# Patient Record
Sex: Female | Born: 1970 | Race: White | Hispanic: No | Smoking: Current every day smoker
Health system: Southern US, Community
[De-identification: ages and names within clinical notes are randomized; demographics above are authoritative.]

## PROBLEM LIST (undated history)

## (undated) DIAGNOSIS — K5792 Diverticulitis of intestine, part unspecified, without perforation or abscess without bleeding: Secondary | ICD-10-CM

## (undated) DIAGNOSIS — K589 Irritable bowel syndrome without diarrhea: Secondary | ICD-10-CM

## (undated) DIAGNOSIS — R0600 Dyspnea, unspecified: Secondary | ICD-10-CM

## (undated) DIAGNOSIS — M419 Scoliosis, unspecified: Secondary | ICD-10-CM

## (undated) DIAGNOSIS — I499 Cardiac arrhythmia, unspecified: Secondary | ICD-10-CM

## (undated) DIAGNOSIS — R519 Headache, unspecified: Secondary | ICD-10-CM

## (undated) DIAGNOSIS — G709 Myoneural disorder, unspecified: Secondary | ICD-10-CM

## (undated) DIAGNOSIS — Z8041 Family history of malignant neoplasm of ovary: Secondary | ICD-10-CM

## (undated) DIAGNOSIS — K219 Gastro-esophageal reflux disease without esophagitis: Secondary | ICD-10-CM

## (undated) DIAGNOSIS — D649 Anemia, unspecified: Secondary | ICD-10-CM

## (undated) DIAGNOSIS — J189 Pneumonia, unspecified organism: Secondary | ICD-10-CM

## (undated) DIAGNOSIS — I209 Angina pectoris, unspecified: Secondary | ICD-10-CM

## (undated) DIAGNOSIS — F419 Anxiety disorder, unspecified: Secondary | ICD-10-CM

## (undated) DIAGNOSIS — Z9889 Other specified postprocedural states: Secondary | ICD-10-CM

## (undated) DIAGNOSIS — C50919 Malignant neoplasm of unspecified site of unspecified female breast: Secondary | ICD-10-CM

## (undated) DIAGNOSIS — R112 Nausea with vomiting, unspecified: Secondary | ICD-10-CM

## (undated) HISTORY — DX: Diverticulitis of intestine, part unspecified, without perforation or abscess without bleeding: K57.92

## (undated) HISTORY — DX: Family history of malignant neoplasm of ovary: Z80.41

## (undated) HISTORY — DX: Malignant neoplasm of unspecified site of unspecified female breast: C50.919

## (undated) HISTORY — PX: ABDOMINAL HYSTERECTOMY: SHX81

## (undated) HISTORY — DX: Scoliosis, unspecified: M41.9

## (undated) HISTORY — PX: BACK SURGERY: SHX140

---

## 1999-07-29 ENCOUNTER — Emergency Department (HOSPITAL_COMMUNITY): Admission: EM | Admit: 1999-07-29 | Discharge: 1999-07-29 | Payer: Self-pay | Admitting: Emergency Medicine

## 1999-07-29 ENCOUNTER — Encounter: Payer: Self-pay | Admitting: Emergency Medicine

## 1999-10-04 ENCOUNTER — Encounter: Payer: Self-pay | Admitting: Emergency Medicine

## 1999-10-04 ENCOUNTER — Emergency Department (HOSPITAL_COMMUNITY): Admission: EM | Admit: 1999-10-04 | Discharge: 1999-10-04 | Payer: Self-pay | Admitting: Emergency Medicine

## 2000-06-14 ENCOUNTER — Other Ambulatory Visit: Admission: RE | Admit: 2000-06-14 | Discharge: 2000-06-14 | Payer: Self-pay | Admitting: Obstetrics and Gynecology

## 2000-08-03 ENCOUNTER — Encounter: Admission: RE | Admit: 2000-08-03 | Discharge: 2000-08-03 | Payer: Self-pay | Admitting: Family Medicine

## 2000-08-03 ENCOUNTER — Encounter: Payer: Self-pay | Admitting: Family Medicine

## 2000-09-14 ENCOUNTER — Other Ambulatory Visit: Admission: RE | Admit: 2000-09-14 | Discharge: 2000-09-14 | Payer: Self-pay | Admitting: *Deleted

## 2000-09-17 ENCOUNTER — Encounter (INDEPENDENT_AMBULATORY_CARE_PROVIDER_SITE_OTHER): Payer: Self-pay | Admitting: Specialist

## 2000-09-17 ENCOUNTER — Other Ambulatory Visit: Admission: RE | Admit: 2000-09-17 | Discharge: 2000-09-17 | Payer: Self-pay | Admitting: *Deleted

## 2000-11-23 ENCOUNTER — Encounter: Admission: RE | Admit: 2000-11-23 | Discharge: 2000-11-23 | Payer: Self-pay | Admitting: Urology

## 2000-11-23 ENCOUNTER — Encounter: Payer: Self-pay | Admitting: Urology

## 2000-12-06 ENCOUNTER — Other Ambulatory Visit: Admission: RE | Admit: 2000-12-06 | Discharge: 2000-12-06 | Payer: Self-pay | Admitting: *Deleted

## 2000-12-06 ENCOUNTER — Encounter (INDEPENDENT_AMBULATORY_CARE_PROVIDER_SITE_OTHER): Payer: Self-pay

## 2001-08-13 ENCOUNTER — Other Ambulatory Visit: Admission: RE | Admit: 2001-08-13 | Discharge: 2001-08-13 | Payer: Self-pay | Admitting: *Deleted

## 2002-02-04 ENCOUNTER — Other Ambulatory Visit: Admission: RE | Admit: 2002-02-04 | Discharge: 2002-02-04 | Payer: Self-pay | Admitting: *Deleted

## 2002-06-20 ENCOUNTER — Other Ambulatory Visit: Admission: RE | Admit: 2002-06-20 | Discharge: 2002-06-20 | Payer: Self-pay | Admitting: Obstetrics and Gynecology

## 2003-01-24 ENCOUNTER — Emergency Department (HOSPITAL_COMMUNITY): Admission: EM | Admit: 2003-01-24 | Discharge: 2003-01-24 | Payer: Self-pay | Admitting: Emergency Medicine

## 2003-05-13 ENCOUNTER — Encounter: Payer: Self-pay | Admitting: Family Medicine

## 2003-05-13 ENCOUNTER — Encounter: Admission: RE | Admit: 2003-05-13 | Discharge: 2003-05-13 | Payer: Self-pay | Admitting: Family Medicine

## 2003-12-02 ENCOUNTER — Other Ambulatory Visit: Admission: RE | Admit: 2003-12-02 | Discharge: 2003-12-02 | Payer: Self-pay | Admitting: Obstetrics and Gynecology

## 2004-08-24 ENCOUNTER — Other Ambulatory Visit: Admission: RE | Admit: 2004-08-24 | Discharge: 2004-08-24 | Payer: Self-pay | Admitting: Obstetrics and Gynecology

## 2004-09-23 ENCOUNTER — Other Ambulatory Visit: Admission: RE | Admit: 2004-09-23 | Discharge: 2004-09-23 | Payer: Self-pay | Admitting: Obstetrics and Gynecology

## 2004-10-07 ENCOUNTER — Encounter: Admission: RE | Admit: 2004-10-07 | Discharge: 2004-10-07 | Payer: Self-pay | Admitting: Gastroenterology

## 2004-11-29 ENCOUNTER — Ambulatory Visit (HOSPITAL_COMMUNITY): Admission: RE | Admit: 2004-11-29 | Discharge: 2004-11-29 | Payer: Self-pay | Admitting: Gastroenterology

## 2004-11-29 ENCOUNTER — Encounter (INDEPENDENT_AMBULATORY_CARE_PROVIDER_SITE_OTHER): Payer: Self-pay | Admitting: Specialist

## 2005-03-01 ENCOUNTER — Other Ambulatory Visit: Admission: RE | Admit: 2005-03-01 | Discharge: 2005-03-01 | Payer: Self-pay | Admitting: Obstetrics and Gynecology

## 2005-04-27 ENCOUNTER — Ambulatory Visit (HOSPITAL_COMMUNITY): Admission: RE | Admit: 2005-04-27 | Discharge: 2005-04-27 | Payer: Self-pay | Admitting: Obstetrics and Gynecology

## 2006-01-02 ENCOUNTER — Encounter (INDEPENDENT_AMBULATORY_CARE_PROVIDER_SITE_OTHER): Payer: Self-pay | Admitting: *Deleted

## 2006-01-02 ENCOUNTER — Inpatient Hospital Stay (HOSPITAL_COMMUNITY): Admission: RE | Admit: 2006-01-02 | Discharge: 2006-01-03 | Payer: Self-pay | Admitting: Obstetrics and Gynecology

## 2008-05-03 ENCOUNTER — Emergency Department (HOSPITAL_COMMUNITY): Admission: EM | Admit: 2008-05-03 | Discharge: 2008-05-03 | Payer: Self-pay | Admitting: Emergency Medicine

## 2008-08-25 ENCOUNTER — Ambulatory Visit: Payer: Self-pay | Admitting: Family Medicine

## 2008-08-25 DIAGNOSIS — F3289 Other specified depressive episodes: Secondary | ICD-10-CM | POA: Insufficient documentation

## 2008-08-25 DIAGNOSIS — F329 Major depressive disorder, single episode, unspecified: Secondary | ICD-10-CM | POA: Insufficient documentation

## 2008-08-25 DIAGNOSIS — F411 Generalized anxiety disorder: Secondary | ICD-10-CM | POA: Insufficient documentation

## 2008-09-03 ENCOUNTER — Encounter (INDEPENDENT_AMBULATORY_CARE_PROVIDER_SITE_OTHER): Payer: Self-pay | Admitting: Family Medicine

## 2008-09-07 ENCOUNTER — Telehealth (INDEPENDENT_AMBULATORY_CARE_PROVIDER_SITE_OTHER): Payer: Self-pay | Admitting: *Deleted

## 2008-09-11 ENCOUNTER — Encounter (INDEPENDENT_AMBULATORY_CARE_PROVIDER_SITE_OTHER): Payer: Self-pay | Admitting: Family Medicine

## 2008-10-12 ENCOUNTER — Ambulatory Visit: Payer: Self-pay | Admitting: Family Medicine

## 2008-10-13 DIAGNOSIS — K589 Irritable bowel syndrome without diarrhea: Secondary | ICD-10-CM | POA: Insufficient documentation

## 2008-11-17 ENCOUNTER — Ambulatory Visit: Payer: Self-pay | Admitting: Family Medicine

## 2008-12-29 ENCOUNTER — Encounter (INDEPENDENT_AMBULATORY_CARE_PROVIDER_SITE_OTHER): Payer: Self-pay | Admitting: Family Medicine

## 2008-12-29 ENCOUNTER — Ambulatory Visit: Payer: Self-pay | Admitting: Family Medicine

## 2008-12-29 DIAGNOSIS — F17200 Nicotine dependence, unspecified, uncomplicated: Secondary | ICD-10-CM | POA: Insufficient documentation

## 2008-12-29 DIAGNOSIS — Z720 Tobacco use: Secondary | ICD-10-CM | POA: Insufficient documentation

## 2008-12-29 LAB — CONVERTED CEMR LAB
ALT: 22 units/L (ref 0–35)
AST: 16 units/L (ref 0–37)
Albumin: 4.3 g/dL (ref 3.5–5.2)
Alkaline Phosphatase: 78 units/L (ref 39–117)
BUN: 5 mg/dL — ABNORMAL LOW (ref 6–23)
Band Neutrophils: 0 % (ref 0–10)
Basophils Absolute: 0 10*3/uL (ref 0.0–0.1)
Basophils Relative: 0 % (ref 0–1)
Bilirubin Urine: NEGATIVE
CO2: 22 meq/L (ref 19–32)
Calcium: 9.6 mg/dL (ref 8.4–10.5)
Chloride: 106 meq/L (ref 96–112)
Cholesterol: 197 mg/dL (ref 0–200)
Creatinine, Ser: 0.66 mg/dL (ref 0.40–1.20)
Eosinophils Absolute: 0.1 10*3/uL (ref 0.0–0.7)
Eosinophils Relative: 1 % (ref 0–5)
Glucose, Bld: 90 mg/dL (ref 70–99)
Glucose, Urine, Semiquant: NEGATIVE
HCT: 41.5 % (ref 36.0–46.0)
HDL: 54 mg/dL (ref >39)
Hemoglobin: 14.8 g/dL (ref 12.0–15.0)
Ketones, urine, test strip: NEGATIVE
LDL Cholesterol: 116 mg/dL — ABNORMAL HIGH (ref 0–99)
Lymphocytes Relative: 52 % — ABNORMAL HIGH (ref 12–46)
Lymphs Abs: 4.9 10*3/uL — ABNORMAL HIGH (ref 0.7–4.0)
MCHC: 35.7 g/dL (ref 30.0–36.0)
MCV: 89.1 fL (ref 78.0–100.0)
Monocytes Absolute: 0.5 10*3/uL (ref 0.1–1.0)
Monocytes Relative: 6 % (ref 3–12)
Neutro Abs: 3.9 10*3/uL (ref 1.7–7.7)
Neutrophils Relative %: 41 % — ABNORMAL LOW (ref 43–77)
Nitrite: NEGATIVE
Platelets: 240 10*3/uL (ref 150–400)
Potassium: 4.7 meq/L (ref 3.5–5.3)
Protein, U semiquant: NEGATIVE
RBC: 4.66 M/uL (ref 3.87–5.11)
RDW: 13.1 % (ref 11.5–15.5)
Sodium: 140 meq/L (ref 135–145)
Specific Gravity, Urine: <1.005
TSH: 2.652 microintl units/mL (ref 0.350–4.500)
Total Bilirubin: 0.5 mg/dL (ref 0.3–1.2)
Total CHOL/HDL Ratio: 3.6
Total Protein: 6.8 g/dL (ref 6.0–8.3)
Triglycerides: 135 mg/dL (ref <150)
Urobilinogen, UA: 0.2
VLDL: 27 mg/dL (ref 0–40)
WBC Urine, dipstick: NEGATIVE
WBC: 9.5 10*3/uL (ref 4.0–10.5)
pH: 7

## 2009-01-01 ENCOUNTER — Encounter (INDEPENDENT_AMBULATORY_CARE_PROVIDER_SITE_OTHER): Payer: Self-pay | Admitting: *Deleted

## 2009-02-09 ENCOUNTER — Ambulatory Visit: Payer: Self-pay | Admitting: Family Medicine

## 2009-02-09 DIAGNOSIS — J301 Allergic rhinitis due to pollen: Secondary | ICD-10-CM | POA: Insufficient documentation

## 2009-05-06 ENCOUNTER — Ambulatory Visit: Payer: Self-pay | Admitting: Internal Medicine

## 2009-05-06 DIAGNOSIS — G4700 Insomnia, unspecified: Secondary | ICD-10-CM | POA: Insufficient documentation

## 2009-05-06 LAB — CONVERTED CEMR LAB: Rapid Strep: NEGATIVE

## 2009-06-16 ENCOUNTER — Telehealth (INDEPENDENT_AMBULATORY_CARE_PROVIDER_SITE_OTHER): Payer: Self-pay | Admitting: Internal Medicine

## 2009-06-16 ENCOUNTER — Emergency Department (HOSPITAL_COMMUNITY): Admission: EM | Admit: 2009-06-16 | Discharge: 2009-06-16 | Payer: Self-pay | Admitting: Family Medicine

## 2009-07-08 ENCOUNTER — Telehealth (INDEPENDENT_AMBULATORY_CARE_PROVIDER_SITE_OTHER): Payer: Self-pay | Admitting: Internal Medicine

## 2009-07-10 ENCOUNTER — Emergency Department (HOSPITAL_COMMUNITY): Admission: EM | Admit: 2009-07-10 | Discharge: 2009-07-10 | Payer: Self-pay | Admitting: Emergency Medicine

## 2009-07-22 ENCOUNTER — Encounter (INDEPENDENT_AMBULATORY_CARE_PROVIDER_SITE_OTHER): Payer: Self-pay | Admitting: *Deleted

## 2009-07-27 ENCOUNTER — Ambulatory Visit: Payer: Self-pay | Admitting: Internal Medicine

## 2009-08-26 ENCOUNTER — Telehealth (INDEPENDENT_AMBULATORY_CARE_PROVIDER_SITE_OTHER): Payer: Self-pay | Admitting: Internal Medicine

## 2009-08-30 ENCOUNTER — Ambulatory Visit: Payer: Self-pay | Admitting: Internal Medicine

## 2009-08-30 LAB — CONVERTED CEMR LAB
Rubella: 23.9 intl units/mL — ABNORMAL HIGH
Rubeola IgG: 4.75 — ABNORMAL HIGH

## 2009-09-08 ENCOUNTER — Encounter (INDEPENDENT_AMBULATORY_CARE_PROVIDER_SITE_OTHER): Payer: Self-pay | Admitting: Internal Medicine

## 2009-09-15 ENCOUNTER — Encounter (INDEPENDENT_AMBULATORY_CARE_PROVIDER_SITE_OTHER): Payer: Self-pay | Admitting: *Deleted

## 2009-09-15 ENCOUNTER — Telehealth (INDEPENDENT_AMBULATORY_CARE_PROVIDER_SITE_OTHER): Payer: Self-pay | Admitting: Internal Medicine

## 2009-10-05 ENCOUNTER — Ambulatory Visit: Payer: Self-pay | Admitting: Internal Medicine

## 2009-10-05 DIAGNOSIS — M2550 Pain in unspecified joint: Secondary | ICD-10-CM | POA: Insufficient documentation

## 2009-10-05 LAB — CONVERTED CEMR LAB
Anti Nuclear Antibody(ANA): NEGATIVE
Rhuematoid fact SerPl-aCnc: <20 intl units/mL (ref 0–20)

## 2009-10-25 ENCOUNTER — Encounter (INDEPENDENT_AMBULATORY_CARE_PROVIDER_SITE_OTHER): Payer: Self-pay | Admitting: Internal Medicine

## 2009-11-05 ENCOUNTER — Telehealth (INDEPENDENT_AMBULATORY_CARE_PROVIDER_SITE_OTHER): Payer: Self-pay | Admitting: *Deleted

## 2009-11-16 ENCOUNTER — Encounter (INDEPENDENT_AMBULATORY_CARE_PROVIDER_SITE_OTHER): Payer: Self-pay | Admitting: Internal Medicine

## 2009-11-16 ENCOUNTER — Telehealth (INDEPENDENT_AMBULATORY_CARE_PROVIDER_SITE_OTHER): Payer: Self-pay | Admitting: Internal Medicine

## 2010-01-04 ENCOUNTER — Ambulatory Visit: Payer: Self-pay | Admitting: Internal Medicine

## 2010-01-04 DIAGNOSIS — M19049 Primary osteoarthritis, unspecified hand: Secondary | ICD-10-CM | POA: Insufficient documentation

## 2010-01-27 ENCOUNTER — Ambulatory Visit: Payer: Self-pay | Admitting: Internal Medicine

## 2010-01-27 DIAGNOSIS — K602 Anal fissure, unspecified: Secondary | ICD-10-CM | POA: Insufficient documentation

## 2010-01-27 DIAGNOSIS — K648 Other hemorrhoids: Secondary | ICD-10-CM | POA: Insufficient documentation

## 2010-03-08 ENCOUNTER — Ambulatory Visit: Payer: Self-pay | Admitting: Internal Medicine

## 2010-03-08 DIAGNOSIS — S335XXA Sprain of ligaments of lumbar spine, initial encounter: Secondary | ICD-10-CM | POA: Insufficient documentation

## 2010-03-17 ENCOUNTER — Telehealth (INDEPENDENT_AMBULATORY_CARE_PROVIDER_SITE_OTHER): Payer: Self-pay | Admitting: Internal Medicine

## 2010-05-27 ENCOUNTER — Encounter (INDEPENDENT_AMBULATORY_CARE_PROVIDER_SITE_OTHER): Payer: Self-pay | Admitting: *Deleted

## 2010-09-14 ENCOUNTER — Ambulatory Visit: Payer: Self-pay | Admitting: Internal Medicine

## 2010-09-14 ENCOUNTER — Encounter: Payer: Self-pay | Admitting: Physician Assistant

## 2010-10-28 ENCOUNTER — Ambulatory Visit: Admit: 2010-10-28 | Payer: Self-pay | Admitting: Internal Medicine

## 2010-11-24 NOTE — Letter (Signed)
Summary: *HSN Results Follow up  [facility]  [address] [place], [place] [address]   Phone: [phone]  Fax: [phone]      05/27/2010   JAICEE MICHELOTTI Sposito [address] [place], [place]  [address]   Dear  Ms. Reham Giel,                            ____S.Drinkard,FNP   ____D. Gore,FNP       ____B. McPherson,MD   ____V. Rankins,MD    __X__E. Mulberry,MD    ____N. Daphine Deutscher, FNP  ____D. Reche Dixon, MD    ____K. Philipp Deputy, MD    ____Other     This letter is to inform you that your recent test(s):  _______Pap Smear    _______Lab Test     _______X-ray    _______ is within acceptable limits  _______ requires a medication change  _______ requires a follow-up lab visit  _______ requires a follow-up visit with your provider   Comments:  Please give [place] a call regarding your recent presciption request.  Thank you.       _________________________________________________________ If you have any questions, please contact our office                     Sincerely,  Tiffany McCoy [name] [facility]

## 2010-11-24 NOTE — Progress Notes (Signed)
Summary: Physical Form  Phone Note Call from Patient Call back at Home Phone [phone]   Summary of Call: Pt states that she gave a physical form to the medical assistant on her last visit and she needs to make sure if the provider can mailed to her to her address asap because she needs to do her immunization by next week (school purpose). Mulberry MD Initial call taken by: Manon Hilding,  November 05, 2009 2:10 PM  Follow-up for Phone Call        forward to provider for completion of physical form..... Follow-up by: Mikey College [name],  November 08, 2009 12:11 PM  Additional Follow-up for Phone Call Additional follow up Details #1::        Where is it? Additional Follow-up by: Julieanne Manson MD,  November 09, 2009 6:31 PM    Additional Follow-up for Phone Call Additional follow up Details #2::    WE FOUND IT/HAS BEEN FILLED OUT //PT PICKED UP//11/15/09 Follow-up by: Arta Bruce,  November 16, 2009 12:43 PM

## 2010-11-24 NOTE — Assessment & Plan Note (Signed)
Summary: bowel movements issues//gk   Vital Signs:  Patient profile:   40 year old female Menstrual status:  hysterectomy Height:      60 inches Weight:      121 pounds BMI:     23.72 Temp:     97.9 degrees F oral Pulse rate:   76 / minute Pulse rhythm:   regular Resp:     18 per minute BP sitting:   113 / 75  (left arm) Cuff size:   regular  Vitals Entered By: [place] Shannon (January 27, 2010 8:46 AM) CC: pt is here because she has bowel movement issues.... pt says she does have hemorroids and she has IBS.... pt says she has been having chest pains and she says she can feel it in her back... Is Patient Diabetic? No Pain Assessment Patient in pain? no       Does patient need assistance? Functional Status Self care Ambulation Normal   CC:  pt is here because she has bowel movement issues.... pt says she does have hemorroids and she has IBS.... pt says she has been having chest pains and she says she can feel it in her back....  History of Present Illness: 1.  Hemorrhoids feel bigger--sensation that needs to go, but then cannot go.   If loose stool, can pass it, but having difficulties otherwise.  Has an episiotomy scar with burning in the area adding to this.  Not clear how much she is straining.  Has had this for 1 1/2 weeks.  Has also had a lot of bloating and early satiety with her IBS.  Using a soluble fiber that gets from health food store.  Uses this regularly.  Gets flairs on and off--generally set off by eating something that doesn't agree with her.  Is stressed with school and unemployment will run out shortly.  Does not feel she needs counseling for this currently.  No melena or hematochezia--is having BRBPR on tissue, however.  2.  Chest discomfort:  Started with bloating 1 1/2 weeks ago.  Sharp pain like a spear in lower sternum through to her back at times.  Worse when abdominal bloating is worse.  No definite change with passing gas or BM.  Radiated to right arm once  when lay down.  Can occur with any activity.  No change with eating as well.  Nothing seems to improve or worsen.  Does get nauseated with it, but has nausea frequently.  Discomfort lasts seconds.  Has never had this before.  Habits & Providers  Alcohol-Tobacco-Diet     Tobacco Status: current     Cigarette Packs/Day: 1.0     Year Started: age 4  Current Medications (verified): 1)  Metamucil 1.7 Gm Wafr (Psyllium) .... Eat 2 Wafers 2-3 Times Each Day For Ibs Diarrhea 2)  Fluticasone Propionate 50 Mcg/act Susp (Fluticasone Propionate) .... 2 Sprays Each Nostril Daily 3)  Allegra 180 Mg Tabs (Fexofenadine Hcl) .... Take 1 Tablet By Mouth Once A Day As Needed Allergy Symptoms 4)  Citalopram Hydrobromide 40 Mg Tabs (Citalopram Hydrobromide) .Marland Kitchen.. 1 Tab By Mouth Daily 5)  Trazodone Hcl 100 Mg Tabs (Trazodone Hcl) .... 1/2 To 1 Tab By Mouth At Bedtime  Allergies (verified): 1)  ! Sulfa 2)  ! Morphine 3)  Zoloft (Sertraline Hcl)  Family History: Mother, 39:  hypertension, OA, diverticulosis. Father, died 68: of CHF, MI at 89, gout, renal failure with CHF. Brother, 48: OA, diverticulosis Son, 22:  Asperger's, allergies, psoriasis,  eczema Son, 18:  Seizures, learning disabilities, developmental delay,  strabismus, speech apraxia   Social History: Associates in medical office;administration. Previously worked for [employer]. Divorced. Has a female  partner x 9 years. Lives at home with 2 sons  Current Smoker Alcohol use-no Drug use-no  Physical Exam  General:  NAD Chest Wall:  NT over anterior chest wall. Lungs:  Normal respiratory effort, chest expands symmetrically. Lungs are clear to auscultation, no crackles or wheezes. Heart:  Normal rate and regular rhythm. S1 and S2 normal without gallop, murmur, click, rub or other extra sounds.  Radial pulses normal and equal Abdomen:  Bowel sounds positive,abdomen soft and non-tender without masses, organomegaly or hernias noted. Rectal:   Fissure at 6 O'Clock--not actively bleeding currently, but appears freshly irritated.  External anus with generalized swelling and irritation of external tags, but no active hemorrhoids externally.  On anoscopy, no source of bleeding internally, just some swelling of tissues.  Fairly liquid light brown stool noted in rectum--scant amt.   Impression & Recommendations:  Problem # 1:  IBS (ICD-564.1) Feel chest discomfort related to this Will try some Dicyclomine to see if helps with bloating. Continue fiber pt. not interested in counseling for stress reduction at this time--will let me know  Problem # 2:  ANAL FISSURE (ICD-565.0)  Probably with some internal hemorrhoidal swelling--could not get a good view today Treat with Anusol.  Orders: Anoscopy (04540)  Complete Medication List: 1)  Metamucil 1.7 Gm Wafr (Psyllium) .... Eat 2 wafers 2-3 times each day for ibs diarrhea 2)  Fluticasone Propionate 50 Mcg/act Susp (Fluticasone propionate) .... 2 sprays each nostril daily 3)  Allegra 180 Mg Tabs (Fexofenadine hcl) .... Take 1 tablet by mouth once a day as needed allergy symptoms 4)  Citalopram Hydrobromide 40 Mg Tabs (Citalopram hydrobromide) .Marland Kitchen.. 1 tab by mouth daily 5)  Trazodone Hcl 100 Mg Tabs (Trazodone hcl) .... 1/2 to 1 tab by mouth at bedtime 6)  Anusol-hc 25 Mg Supp (Hydrocortisone acetate) .Marland Kitchen.. 1 supp per rectum three times a day, especially after  bm, as needed 7)  Anusol-hc 2.5 % Crea (Hydrocortisone) .... Apply three times a day to affected area as needed, especially after bm 8)  Dicyclomine Hcl 10 Mg Caps (Dicyclomine hcl) .Marland Kitchen.. 1 cap every 6 hours as needed for bloating  Patient Instructions: 1)  Witch hazel to affected area after BM--allow to air dry--then apply Anusol 2)  Call if new symptoms with Chest pain  Prescriptions: DICYCLOMINE HCL 10 MG CAPS (DICYCLOMINE HCL) 1 cap every 6 hours as needed for bloating  #30 x 1   Entered and Authorized by:   Julieanne Manson  MD   Signed by:   Julieanne Manson MD on 01/27/2010   Method used:   Print then Give to Patient   RxID:   [id number] ANUSOL-HC 2.5 % CREA (HYDROCORTISONE) apply three times a day to affected area as needed, especially after BM  #60 g x 1   Entered and Authorized by:   Julieanne Manson MD   Signed by:   Julieanne Manson MD on 01/27/2010   Method used:   Print then Give to Patient   RxID:   [id number] ANUSOL-HC 25 MG SUPP (HYDROCORTISONE ACETATE) 1 supp per rectum three times a day, especially after  BM, as needed  #24 x 0   Entered and Authorized by:   Julieanne Manson MD   Signed by:   Julieanne Manson MD on 01/27/2010   Method used:  Print then Give to Patient   RxID:   [id number]

## 2010-11-24 NOTE — Letter (Signed)
Summary: Handout Printed  Printed Handout:  - Viral Pharyngitis, Easy-to-Read

## 2010-11-24 NOTE — Progress Notes (Signed)
Summary: refill request  Phone Note Call from Patient Call back at Home Phone [phone]   Summary of Call: Pt also wants to make sure if she can get refills from valium ; she states that this medication is the only one that help her with anxiety issues.  The other medication gave her headache. Dyann Kief [place] [phone]) Delrae Alfred MD Initial call taken by: Manon Hilding,  November 16, 2009 12:36 PM  Follow-up for Phone Call        The pt still waiting for someone to call her back because she needs refills from her valium.  Please call her back asap.Manon Hilding  November 18, 2009 11:54 AM   will forward to provider to review...................Marland KitchenMikey College [name]  November 18, 2009 11:58 AM    Additional Follow-up for Phone Call Additional follow up Details #1::        We have discussed this previously. What dose of Citalopram is she taking currently? If she is not doing well with current meds, she will need to be seen. The type of med she is requesting is not something I use for long term treatment--it is habit forming. Additional Follow-up by: Julieanne Manson MD,  November 18, 2009 6:20 PM    Additional Follow-up for Phone Call Additional follow up Details #2::    Pt is currently taking citalopram 40mg   a day.  She says she just feels anxious sometimes and wants something to take when she feels like that.  She said her depression is fine.  She has an appt in March with you.  [facility] [address]  Pt can be reached at [phone].  Ok to leave msg. Follow-up by: Vesta Mixer [name],  November 19, 2009 12:23 PM  Additional Follow-up for Phone Call Additional follow up Details #3:: Details for Additional Follow-up Action Taken: Will dicuss at her appt. in March. The Citalopram is for her anxiety as well and I have discussed with her no benzodiazepines , which is the group of meds she is asking for.  Julieanne Manson MD  November 23, 2009 5:44 PM   left detailed msg on  pt's voice mail at her request......... Vesta Mixer [name]  November 26, 2009 4:37 PM

## 2010-11-24 NOTE — Letter (Signed)
Summary: [employer] [employer]//PT PICKED UP 11/15/09  [employer]//PT PICKED UP 11/15/09   Imported By: Silvio Pate Stanislawscyk 11/16/2009 12:38:51  _____________________________________________________________________  External Attachment:    Type:   Image     Comment:   External Document

## 2010-11-24 NOTE — Letter (Signed)
Summary: *HSN Results Follow up  [facility]  [address] [place], [place] [address]   Phone: [phone]  Fax: [phone]      10/25/2009   Tricia Berry [address] [place], [place]  [address]   Dear  [name],                            ____S.Drinkard,FNP   ____D. Gore,FNP       ____B. McPherson,MD   ____V. Rankins,MD    ___X_E. Rashaad Hallstrom,MD    ____N. Daphine Deutscher, FNP  ____D. Reche Dixon, MD    ____K. Philipp Deputy, MD    ____Other     This letter is to inform you that your recent test(s):  _______Pap Smear    ____X___Lab Test     _______X-ray    ____X___ is within acceptable limits  _______ requires a medication change  _______ requires a follow-up lab visit  _______ requires a follow-up visit with your provider   Comments:  Labs were negative for an inflammatory arthritis--probably just wear and tear arthritis       _________________________________________________________ If you have any questions, please contact our office                     Sincerely,  Julieanne Manson MD [facility]

## 2010-11-24 NOTE — Progress Notes (Signed)
Summary: Has itching rash and blisters  Phone Note Call from Patient   Caller: Patient Call For: Julieanne Manson MD Summary of Call: Pt. called, c/o itching, red, blistering rash to lower extremities and torso. States she was in the woods on Sunday and rash began about Tuesday, believes it to be poison oak or sumac.  Has been using Calogel.  Spoke to Dr. Delrae Alfred; pt. advised to soak in cold water x20 min. 4 times a day and  Benadryl by mouth every six hours as needed to relieve itching.  Pt. advised that Benadryl can cause sleepiness.  Advised to not scratch and wear light clothing until rash is gone, as well as washing hands before touching face or eyes.  Verbalized understanding.  Initial call taken by: Dutch Quint RN,  Mar 17, 2010 3:27 PM

## 2010-11-24 NOTE — Assessment & Plan Note (Signed)
Summary: 2 MONTH FU ON INSOMNIA//KT   Vital Signs:  Patient profile:   40 year old female Menstrual status:  hysterectomy Weight:      122 pounds Temp:     98.3 degrees F Pulse rate:   96 / minute Pulse rhythm:   regular BP standing:   107 / 75  (right arm) Cuff size:   regular  Vitals Entered By: Vesta Mixer [name] (Mar 08, 2010 10:26 AM) CC: Insominia is doing much better on the medication.  Is in bad back pain right now though from working in the yard last Thursday. Is Patient Diabetic? No Pain Assessment Patient in pain? yes     Location: lower back Intensity: 5  Does patient need assistance? Ambulation Normal   CC:  Insominia is doing much better on the medication.  Is in bad back pain right now though from working in the yard last Thursday.Marland Kitchen  History of Present Illness: 1.  Insomnia:  Trazodone has helped very much with sleep.  Feels well rested in morning when arises.  Often will have 2 days of week when able to fall asleep and does not need.  JItteriness and anxiety well controlled as well.  Pt. states those around her feel she is more relaxed and "even keeled."  2.  Probable IBS:  Dicyclomine has helped with explosive stools, especially after meals.  Not so much difficulty with bloating as she had before--the medication actually causes bloating, but not the painful sort she was having previously.    3.  Hemorrhoids:  also better with control of #2.  4.  Low back pain with pain on right.  States was doing yard work last week.  Was bent over much of day and lifting inappropriately.  Using heating pad.  Unable to soak in bathtub.  Tylenol not helping.  Taking Ibuprofen--800 mg  every 6 hours.  Not sure if it has really helped--very stiff.  Needs help getting out of bed in morning.     Allergies (verified): 1)  ! Sulfa 2)  ! Morphine 3)  Zoloft (Sertraline Hcl)  Physical Exam  General:  Difficulty sitting secondary to right low back and buttock pain Msk:  Tender  over right paraspinous musculature in sacral area only.  Tender over ischial tuberosity Neurologic:  Unable to elicit DTRs of LE bilaterally--unable to relax legs.  Walks on tip toes of right foot.   Impression & Recommendations:  Problem # 1:  INTERNAL HEMORRHOIDS (ICD-455.0) Improved  Problem # 2:  IBS (ICD-564.1) IMproved with Dicyclomine  Problem # 3:  INSOMNIA, CHRONIC (ICD-307.42) Much better with Trazodone.  Problem # 4:  BACK STRAIN, LUMBAR (ICD-847.2) May have more of a tendinitis of hamstrings as well Switch to Diclofenac and Cyclobenzaprine Gentle ROM  Problem # 5:  ANXIETY (ICD-300.00) Better Her updated medication list for this problem includes:    Citalopram Hydrobromide 40 Mg Tabs (Citalopram hydrobromide) .Marland Kitchen... 1 tab by mouth daily    Trazodone Hcl 100 Mg Tabs (Trazodone hcl) .Marland Kitchen... 1/2 to 1 tab by mouth at bedtime  Complete Medication List: 1)  Metamucil 1.7 Gm Wafr (Psyllium) .... Eat 2 wafers 2-3 times each day for ibs diarrhea 2)  Fluticasone Propionate 50 Mcg/act Susp (Fluticasone propionate) .... 2 sprays each nostril daily 3)  Allegra 180 Mg Tabs (Fexofenadine hcl) .... Take 1 tablet by mouth once a day as needed allergy symptoms 4)  Citalopram Hydrobromide 40 Mg Tabs (Citalopram hydrobromide) .Marland Kitchen.. 1 tab by mouth daily 5)  Trazodone Hcl 100 Mg Tabs (Trazodone hcl) .... 1/2 to 1 tab by mouth at bedtime 6)  Anusol-hc 25 Mg Supp (Hydrocortisone acetate) .Marland Kitchen.. 1 supp per rectum three times a day, especially after  bm, as needed 7)  Anusol-hc 2.5 % Crea (Hydrocortisone) .... Apply three times a day to affected area as needed, especially after bm 8)  Dicyclomine Hcl 10 Mg Caps (Dicyclomine hcl) .Marland Kitchen.. 1 cap every 6 hours as needed for bloating 9)  Diclofenac Sodium 75 Mg Tbec (Diclofenac sodium) .Marland Kitchen.. 1 tab by mouth two times a day with food as needed back pain 10)  Cyclobenzaprine Hcl 10 Mg Tabs (Cyclobenzaprine hcl) .... 1/2 to 1 tab every 8 hours as needed for  muscle spasm and pain  Patient Instructions: 1)  Schedule CPP in 4 months with Dr. Delrae Alfred Prescriptions: CYCLOBENZAPRINE HCL 10 MG TABS (CYCLOBENZAPRINE HCL) 1/2 to 1 tab every 8 hours as needed for muscle spasm and pain  #20 x 0   Entered and Authorized by:   Julieanne Manson MD   Signed by:   Julieanne Manson MD on 03/08/2010   Method used:   Print then Give to Patient   RxID:   [id number] DICYCLOMINE HCL 10 MG CAPS (DICYCLOMINE HCL) 1 cap every 6 hours as needed for bloating  #30 x 4   Entered and Authorized by:   Julieanne Manson MD   Signed by:   Julieanne Manson MD on 03/08/2010   Method used:   Print then Give to Patient   RxID:   [id number] TRAZODONE HCL 100 MG TABS (TRAZODONE HCL) 1/2 to 1 tab by mouth at bedtime  #30 x 11   Entered and Authorized by:   Julieanne Manson MD   Signed by:   Julieanne Manson MD on 03/08/2010   Method used:   Electronically to        [facility] [address]       [address], [address]       [place], [place]  [address]       Ph: [phone]       Fax: [phone]   RxID:   [id number] DICLOFENAC SODIUM 75 MG TBEC (DICLOFENAC SODIUM) 1 tab by mouth two times a day with food as needed back pain  #60 x 1   Entered and Authorized by:   Julieanne Manson MD   Signed by:   Julieanne Manson MD on 03/08/2010   Method used:   Electronically to        [facility] [address] ([facility]ail)       [address], [address]       [place], [place]  [address]       Ph: [phone]       Fax: [phone]   RxID:   [id number]

## 2010-11-24 NOTE — Assessment & Plan Note (Signed)
Summary: F/U Anxiety and Pain//mm   Vital Signs:  Patient profile:   40 year old female Menstrual status:  hysterectomy Weight:      122 pounds Temp:     98.3 degrees F Pulse rate:   76 / minute Pulse rhythm:   regular BP sitting:   114 / 75  (left arm) Cuff size:   regular  Vitals Entered By: Hale Drone (January 04, 2010 9:33 AM) CC: Pt here for f/u Per pt., anxiety is still the same. Pain in the hands has gotten better.  Is Patient Diabetic? No Pain Assessment Patient in pain? no       Does patient need assistance? Ambulation Normal   CC:  Pt here for f/u Per pt. and anxiety is still the same. Pain in the hands has gotten better. Marland Kitchen  History of Present Illness: 1.  Hand joint discomfort:  Appears to have OA of hands.  Joint pain is better.  Labs negative for inflammatory arthritis.  Taking MSM/glucosamine/chondroitin and seems to have helped a bit.    2.  Anxiety and Depression:  Still not sleeping at night--difficulty falling asleep mainly.  Increase in Citalopram to 40 mg did not seem to help.  Does not feel she is depressed--just insides feel "jittery".  Has been on Lexapro with best results. Has been on Effexor--slept all the time, Wellbutrin--went into rage, Zoloft--increased IBS symptoms--nonstop diarrhea.  Never on Amitriptyline, Cymbalta, Remeron, Trazadone.   Current Medications (verified): 1)  Metamucil 1.7 Gm Wafr (Psyllium) .... Eat 2 Wafers 2-3 Times Each Day For Ibs Diarrhea 2)  Flonase 50 Mcg/act Susp (Fluticasone Propionate) .... Use 2 Sprays To Each Side of Nose 3)  Allegra 180 Mg Tabs (Fexofenadine Hcl) .... Take 1 Tablet By Mouth Once A Day As Needed Allergy Symptoms 4)  Citalopram Hydrobromide 40 Mg Tabs (Citalopram Hydrobromide) .Marland Kitchen.. 1 Tab By Mouth Daily  Allergies (verified): 1)  ! Sulfa 2)  ! Morphine 3)  Zoloft (Sertraline Hcl)  Physical Exam  Extremities:  Hypertrophic changes of multple PIPS  and 1st MCPs without synovial thickening or erythema.   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 Unable to get to pharmacy to pick up ICP Lexapro. For now, stay on Citalopram in the morning and add Trazodone at bedtime  Complete Medication List: 1)  Metamucil 1.7 Gm Wafr (Psyllium) .... Eat 2 wafers 2-3 times each day for ibs diarrhea 2)  Fluticasone Propionate 50 Mcg/act Susp (Fluticasone propionate) .... 2 sprays each nostril daily 3)  Allegra 180 Mg Tabs (Fexofenadine hcl) .... Take 1 tablet by mouth once a day as needed allergy symptoms 4)  Citalopram Hydrobromide 40 Mg Tabs (Citalopram hydrobromide) .Marland Kitchen.. 1 tab by mouth daily 5)  Trazodone Hcl 100 Mg Tabs (Trazodone hcl) .... 1/2 to 1 tab by mouth at bedtime  Patient Instructions: 1)  Follow up with Dr. Delrae Alfred in 2 months --insomnia Prescriptions: CITALOPRAM HYDROBROMIDE 40 MG TABS (CITALOPRAM HYDROBROMIDE) 1 tab by mouth daily  #30 x 11   Entered and Authorized by:   Julieanne Manson MD   Signed by:   Julieanne Manson MD on 01/04/2010   Method used:   Electronically to        [facility] [address] ([facility]ail)       [address]       [place], [place]  [address]       Ph: [phone]       Fax: [phone]   RxID:   [id number] TRAZODONE HCL 100 MG TABS (TRAZODONE HCL) 1/2 to 1 tab by mouth at bedtime  #30 x 2   Entered and Authorized by:   Julieanne Manson MD   Signed by:   Julieanne Manson MD on 01/04/2010   Method used:   Electronically to        [facility] [address])       [address], [address]       [place], [place]  [address]       Ph: [phone]       Fax: [phone]   RxID:   [id number] FLUTICASONE PROPIONATE 50 MCG/ACT SUSP (FLUTICASONE PROPIONATE) 2  sprays each nostril daily  #1 x 11   Entered and Authorized by:   Julieanne Manson MD   Signed by:   Julieanne Manson MD on 01/04/2010   Method used:   Electronically to        [facility] [address]       [address], [address]       [place], [place]  [address]       Ph: [phone]       Fax: [phone]   RxID:   [id number]

## 2010-12-21 ENCOUNTER — Encounter (HOSPITAL_COMMUNITY): Payer: Self-pay | Admitting: Radiology

## 2010-12-21 ENCOUNTER — Emergency Department (HOSPITAL_COMMUNITY): Payer: Self-pay

## 2010-12-21 ENCOUNTER — Emergency Department (HOSPITAL_COMMUNITY)
Admission: EM | Admit: 2010-12-21 | Discharge: 2010-12-21 | Disposition: A | Discharge status: Home / Self Care | Payer: Self-pay | Attending: Emergency Medicine | Admitting: Emergency Medicine

## 2010-12-21 DIAGNOSIS — F411 Generalized anxiety disorder: Secondary | ICD-10-CM | POA: Insufficient documentation

## 2010-12-21 DIAGNOSIS — R10814 Left lower quadrant abdominal tenderness: Secondary | ICD-10-CM | POA: Insufficient documentation

## 2010-12-21 DIAGNOSIS — K5732 Diverticulitis of large intestine without perforation or abscess without bleeding: Secondary | ICD-10-CM | POA: Insufficient documentation

## 2010-12-21 DIAGNOSIS — R1032 Left lower quadrant pain: Secondary | ICD-10-CM | POA: Insufficient documentation

## 2010-12-21 HISTORY — DX: Irritable bowel syndrome, unspecified: K58.9

## 2010-12-21 LAB — COMPREHENSIVE METABOLIC PANEL
ALT: 18 U/L (ref 0–35)
Albumin: 3.9 g/dL (ref 3.5–5.2)
Alkaline Phosphatase: 70 U/L (ref 39–117)
Potassium: 3.8 mEq/L (ref 3.5–5.1)
Sodium: 139 mEq/L (ref 135–145)
Total Protein: 6.9 g/dL (ref 6.0–8.3)

## 2010-12-21 LAB — URINALYSIS, ROUTINE W REFLEX MICROSCOPIC
Bilirubin Urine: NEGATIVE
Protein, ur: NEGATIVE mg/dL
Urobilinogen, UA: 0.2 mg/dL (ref 0.0–1.0)

## 2010-12-21 LAB — DIFFERENTIAL
Basophils Absolute: 0 10*3/uL (ref 0.0–0.1)
Basophils Relative: 0 % (ref 0–1)
Eosinophils Absolute: 0 10*3/uL (ref 0.0–0.7)
Eosinophils Relative: 0 % (ref 0–5)
Lymphocytes Relative: 24 % (ref 12–46)

## 2010-12-21 LAB — CBC
Platelets: 239 10*3/uL (ref 150–400)
RDW: 12.8 % (ref 11.5–15.5)
WBC: 12.8 10*3/uL — ABNORMAL HIGH (ref 4.0–10.5)

## 2011-03-10 NOTE — Op Note (Signed)
[name], Tricia Berry           ACCOUNT NO.:  [id number]   MEDICAL RECORD NO.:  [id number]          PATIENT TYPE:  AMB   LOCATION:  ENDO                         FACILITY:  [facility]   PHYSICIAN:  Petra Kuba, M.D.    DATE OF BIRTH:  [birth year]   DATE OF PROCEDURE:  11/29/2004  DATE OF DISCHARGE:                                 OPERATIVE REPORT   PROCEDURE:  Colonoscopy with biopsy.   INDICATIONS:  Diarrhea, probable irritable bowel syndrome, want to rule out  Crohn's disease.  Consent was signed after risks, benefits, methods, options  thoroughly discussed in the office.   MEDICINES USED:  Demerol 70, Versed 8.   PROCEDURE:  Rectal inspection is pertinent for external hemorrhoids, small.  Digital examination was negative.  Video pediatric adjustable colonoscope  was inserted, easily advanced around the colon to the cecum.  This did not  require any abdominal pressure or any position changes.  No abnormality was  seen on insertion.  In fact, the scope was inserted short ways in the  terminal ileum which was normal.  Photo documentation was obtained.  Scattered biopsies were obtained and put in the first container.  Scope was  slowly withdrawn.  The colon was normal.  Random biopsies were obtained and  put in the second container.  Prep was adequate.  There was some liquid  stool that required washing and suctioning.  Once back in the rectum,  anorectal pullthrough and retroflexion confirmed some small hemorrhoids.  She had a white abnormal patch like mucosa in her rectum.  I doubt it was  significant but a few biopsies were obtained and put in a third container.  The scope was drained and readvanced a short ways up the left side of the  colon.  Air was suctioned.  The scope was removed.  The patient tolerated  the procedure well.  There was no obvious immediate complication.   ENDOSCOPIC DIAGNOSES:  1.  Tiny internal and external hemorrhoids.  2.  Abnormal white mucosa of the  rectum, status post biopsy.  3.  Otherwise within normal limits to the terminal ileum, status post random      biopsies throughout.   PLAN:  Await pathology, continue Librax as it seems to be helping.  Follow  up p.r.n. or in six weeks to recheck symptoms and make sure no further  workup plans are needed.     MEM/MEDQ  D:  11/29/2004  T:  11/29/2004  Job:  [id number]   cc:   Gretta Arab. Valentina Lucks, M.D.  [address] [place]  [place] [address]  Fax: [phone]

## 2011-03-10 NOTE — Op Note (Signed)
NAME:  Tricia Berry, Tricia Berry NO.:  [id number]   MEDICAL RECORD NO.:  [id number]          PATIENT TYPE:  OBV   LOCATION:  9316                          FACILITY:  [facility]   PHYSICIAN:  Malva Limes, M.D.    DATE OF BIRTH:  09-15-1971   DATE OF PROCEDURE:  DATE OF DISCHARGE:                                 OPERATIVE REPORT   Audio too short to transcribe (less than 5 seconds)           ______________________________  Malva Limes, M.D.     MA/MEDQ  D:  01/02/2006  T:  01/02/2006  Job:  [id number]

## 2011-03-10 NOTE — Op Note (Signed)
Tricia Berry, Tricia Berry NO.:  [id number]   MEDICAL RECORD NO.:  [id number]          PATIENT TYPE:  OBV   LOCATION:  9316                          FACILITY:  [facility]   PHYSICIAN:  Malva Limes, M.D.    DATE OF BIRTH:  [birth year]   DATE OF PROCEDURE:  01/02/2006  DATE OF DISCHARGE:                                 OPERATIVE REPORT   PREOPERATIVE DIAGNOSES:  1.  Chronic pelvic pain.  2.  Dyspareunia.  3.  History of endometriosis.   POSTOPERATIVE DIAGNOSES:  1.  Chronic pelvic pain.  2.  Dyspareunia.  3.  History of endometriosis.   OPERATION/PROCEDURE:  1.  Laparoscopy.  2.  Total vaginal hysterectomy.   SURGEON:  Malva Limes, M.D.   ASSISTANT:  Ilda Mori, M.D.   ANESTHESIA:  General endotracheal anesthesia.   ANTIBIOTICS:  Ancef 1 g.   ESTIMATED BLOOD LOSS:  150 mL.   COMPLICATIONS:  None.   SPECIMENS:  Uterus and cervix sent to pathology.   DRAINS:  Foley to bedside drainage.   DESCRIPTION OF PROCEDURE:  The patient was taken to the operating room where  general anesthesia was administered without complications.  She was then  placed in the dorsal lithotomy position. She was prepped and draped with  Betadine and draped in the usual fashion.  A red rubber catheter was used to  drain the bladder.  A Hulka tenaculum was applied to the anterior cervical  lip.  The umbilicus was grasped.  A vertical skin incision was made.  The  fascia was grasped and entered with the Mayo scissors.  The parietal  peritoneum was entered bluntly.  Sutures were placed laterally.  The Hasson  cannula was placed into the peritoneal cavity and  3 L of carbon dioxide was  then insufflated. The scope was placed in the abdominal cavity.  At this  point the patient had no evidence of endometriosis or adhesions. Ovaries and  fallopian tubes were appeared to be normal bilaterally.  The uterus was  appeared to be normal.  There were no problems with bowel.  At this point it  was decided to proceed with a vaginal hysterectomy.   Following this, the weighted speculum was placed in the vagina.  The  posterior cul-de-sac was entered sharply.  The uterosacral ligaments were  bilaterally clamped, cut and ligated with 0 Monocryl suture.  The cervix was  circumscribed.  The anterior cul-de-sac was entered bluntly.  The bladder  pillars were bilaterally clamped and cut and ligated with 0 Monocryl suture.  The cardinal ligaments were serially clamped, cut and ligated with 0  Monocryl suture.  The uterine vessels were bilaterally clamped, cut and  ligated with 0 Monocryl suture.  When the level of the round ligament was  reached, the uterus inverted and the triple pedicle which included the  fallopian tube, round ligament and ovarian ligament were bilaterally  clamped, cut and ligated with 0 Monocryl suture.  Following this, all  pedicles were checked and found to be hemostatic.  The vagina was then  closed using 2-0 Vicryl in a running locking fashion.  After  this, the  abdomen was reinsufflated and the pedicles were all checked and found to be  hemostatic.  A suspensory suture had been placed just prior to closing the  vagina which involved the uterosacral ligaments and the posterior cul-de-  sac.  On examination from the laparoscopy, it was felt that there might be  some tenting on the right ureter and, therefore, we decided to remove this  stitch from below.  This was accomplished without difficulty.  The  laparoscope was removed.  The pneumoperitoneum released.  The fascia was  closed with interrupted 0 Vicryl suture.  The skin closed with interrupted 4-  0 Vicryl suture.  Dermabond was used to close the suprapubic port.  The  patient tolerated the procedure well. She was taken to the recovery room in  stable condition.  Instrument and lap counts were correct x2.           ______________________________  Malva Limes, M.D.     MA/MEDQ  D:  01/02/2006  T:   01/03/2006  Job:  [id number]

## 2011-03-10 NOTE — H&P (Signed)
NAME:  BURNA, ATLAS NO.:  [id number]   MEDICAL RECORD NO.:  [id number]          PATIENT TYPE:  AMB   LOCATION:  SDC                           FACILITY:  [facility]   PHYSICIAN:  Malva Limes, M.D.    DATE OF BIRTH:  03/17/1971   DATE OF ADMISSION:  DATE OF DISCHARGE:                                HISTORY & PHYSICAL   HISTORY OF PRESENT ILLNESS:  Ms. Klunder is a 40 year old white female G3,  P2 who presents to [facility] for laparoscopically assisted vaginal  hysterectomy secondary to a long history of chronic pelvic pain and  dyspareunia. The patient has a several year history of pelvic pain and  dyspareunia. She did undergo a laparoscopy in July of 2006.  At that time  she was found to have endometriosis on her right uterosacral ligament.  The  patient was offered Lupron postoperatively and declined.  The patient stated  that she had done well for approximately six months and the pain has  returned. She was offered Lupron or a repeat laparoscopy.  The patient  declined both; she stated that she wanted definitive therapy.  The patient  does smoke and therefore oral contraceptive pills were not an option.  The  patient states that the pain is mostly on the right and this is where the  endometriosis was located at the time of laparoscopy.   PAST MEDICAL HISTORY:  The patient does have a history of Chlamydia in 2000.   PAST SURGICAL HISTORY:  Includes two vaginal births and the laparoscopy as  mentioned above.   ALLERGIES:  The patient has an allergy to SULFA.   SOCIAL HISTORY:  She smokes one pack per day, drinks alcohol occasionally.  She denies drug use.   FAMILY HISTORY:  She has a family history of cardiovascular disease and  ovarian cancer.  The patient also has a history of depression.   CURRENT MEDICATIONS:  None.   PHYSICAL EXAMINATION:  GENERAL APPEARANCE:  The patient is a well-developed,  well-nourished white female in no apparent distress.  HEENT:  Within normal limits.  LUNGS: Clear to auscultation.  CARDIOVASCULAR:  Examination reveals a regular rate and rhythm without  murmurs.  BREASTS:  Without masses or tenderness.  There is no lymphadenopathy.  ABDOMEN: Soft, nontender, nondistended. There are no palpable masses or  guarding.  EXTREMITIES:  Within normal limits.  PELVIC:  Examination reveals normal external genitalia. Vagina is without  lesions or discharge.  She has mild cervical motion tenderness.  The uterus  is normal size, shape.  There are no adnexal masses.   IMPRESSION:  1.  Pelvic pain.  2.  Dyspareunia.  3.  History of endometriosis.   PLAN:  Proceed with laparoscopically assisted vaginal hysterectomy.           ______________________________  Malva Limes, M.D.     MA/MEDQ  D:  01/01/2006  T:  01/01/2006  Job:  [id number]

## 2011-03-10 NOTE — Op Note (Signed)
NAME:  Tricia Berry, MILSTEIN NO.:  [id number]   MEDICAL RECORD NO.:  [id number]          PATIENT TYPE:  AMB   LOCATION:  SDC                           FACILITY:  [facility]   PHYSICIAN:  Malva Limes, M.D.    DATE OF BIRTH:  10-Dec-1970   DATE OF PROCEDURE:  04/27/2005  DATE OF DISCHARGE:                                 OPERATIVE REPORT   PREOPERATIVE DIAGNOSES:  1.  Chronic pelvic pain.  2.  Dyspareunia.   POSTOPERATIVE DIAGNOSES:  1.  Chronic pelvic pain.  2.  Dyspareunia.  3.  Minimal endometriosis.   PROCEDURES:  1.  Diagnostic laparoscopy.  2.  Cauterization of minimal endometriosis on right uterosacral ligament.  3.  Laparoscopic uterosacral nerve ablation.   SURGEON:  Malva Limes, M.D.   ANESTHESIA:  General.   ANTIBIOTICS:  Ancef 1 g.   ESTIMATED BLOOD LOSS:  Minimal.   COMPLICATIONS:  None.   SPECIMENS:  None.   PROCEDURE:  The patient was taken to the operating room, where a general  anesthetic was administered without complication.  She was then placed in  the dorsal lithotomy position.  She was prepped with Hibiclens and draped in  the usual fashion for this procedure.  A Hulka tenaculum was applied to the  anterior cervical lip.  The bladder was drained with a red rubber catheter.  Her umbilicus was injected with 0.25% Marcaine.  A vertical skin incision  was made.  The Veress needle was placed in the peritoneal cavity.  Three  liters of carbon dioxide was insufflated.  The 10 mm trocar was then placed  into the peritoneal cavity.  The scope was then placed.  A 5 mm port was  then placed under direct visualization in the suprapubic region.  Examination revealed a normal liver.  The gallbladder was not visualized.  The appendix appeared to be free and normal.  The anterior cul-de-sac was  without adhesions or endometriosis.  The uterus appeared to be normal size  and shape.  The fallopian tube and ovaries looked normal bilaterally.  The  patient  had normal ureters bilaterally.  The patient had minimal  endometriosis on the right pelvic sidewall near the uterosacral ligament.  The left pelvic sidewall appeared to be completely normal.  There was no  evidence of any abdominal adhesions or masses.  Once a thorough examination  was performed, the cautery was used to treat the endometriosis in the right  pelvic sidewall.  Following this, both uterosacral ligaments were  cauterized.  This concluded the procedure.  The patient was noted to have  large, congested veins in the infundibulopelvic ligament.  Otherwise, all  findings were as described above.  This concluded the procedure.  The  instruments were then removed, pneumoperitoneum released.  The fascia was  closed with an interrupted 0 Vicryl suture, the skin with 4-0 Vicryl.  Dermabond was used to close the port in the suprapubic region.  The patient  tolerated the procedure well.  She was extubated and taken to the recovery  room in stable condition.  Instrument and lap counts were correct x1.  The  patient will be discharged to home.  She will be sent home on Percocet to  take p.r.n.       MA/MEDQ  D:  04/27/2005  T:  04/27/2005  Job:  [id number]

## 2011-07-20 LAB — POCT I-STAT, CHEM 8
Glucose, Bld: 89
HCT: 44
Hemoglobin: 15
Potassium: 3.6
TCO2: 27

## 2011-07-20 LAB — CBC
MCHC: 34
MCV: 93.1
Platelets: 239
RBC: 4.54

## 2011-07-20 LAB — URINALYSIS, ROUTINE W REFLEX MICROSCOPIC
Glucose, UA: NEGATIVE
Leukocytes, UA: NEGATIVE
Specific Gravity, Urine: 1.002 — ABNORMAL LOW
pH: 6

## 2011-07-20 LAB — DIFFERENTIAL
Basophils Relative: 1
Eosinophils Absolute: 0.2
Monocytes Relative: 7
Neutrophils Relative %: 46

## 2011-07-20 LAB — URINE MICROSCOPIC-ADD ON

## 2011-07-20 LAB — WET PREP, GENITAL
Trich, Wet Prep: NONE SEEN
Yeast Wet Prep HPF POC: NONE SEEN

## 2011-12-01 ENCOUNTER — Other Ambulatory Visit: Payer: Self-pay | Admitting: Internal Medicine

## 2011-12-01 DIAGNOSIS — Z1231 Encounter for screening mammogram for malignant neoplasm of breast: Secondary | ICD-10-CM

## 2011-12-25 ENCOUNTER — Ambulatory Visit (HOSPITAL_COMMUNITY)
Admission: RE | Admit: 2011-12-25 | Discharge: 2011-12-25 | Disposition: A | Discharge status: Home / Self Care | Payer: Self-pay | Source: Ambulatory Visit | Attending: Internal Medicine | Admitting: Internal Medicine

## 2011-12-25 DIAGNOSIS — Z1231 Encounter for screening mammogram for malignant neoplasm of breast: Secondary | ICD-10-CM | POA: Insufficient documentation

## 2012-02-12 ENCOUNTER — Encounter (HOSPITAL_COMMUNITY): Payer: Self-pay | Admitting: *Deleted

## 2012-02-12 ENCOUNTER — Emergency Department (HOSPITAL_COMMUNITY)
Admission: EM | Admit: 2012-02-12 | Discharge: 2012-02-12 | Disposition: A | Discharge status: Home / Self Care | Payer: Self-pay | Source: Home / Self Care | Attending: Emergency Medicine | Admitting: Emergency Medicine

## 2012-02-12 DIAGNOSIS — J029 Acute pharyngitis, unspecified: Secondary | ICD-10-CM

## 2012-02-12 HISTORY — DX: Gastro-esophageal reflux disease without esophagitis: K21.9

## 2012-02-12 HISTORY — DX: Anxiety disorder, unspecified: F41.9

## 2012-02-12 LAB — POCT RAPID STREP A: Streptococcus, Group A Screen (Direct): NEGATIVE

## 2012-02-12 MED ORDER — DIPHENHYDRAMINE HCL 12.5 MG/5ML PO LIQD: 12.5000 mg | Freq: Four times a day (QID) | ORAL | Status: DC | PRN

## 2012-02-12 MED ORDER — ONDANSETRON HCL 4 MG PO TABS: 4.0000 mg | ORAL_TABLET | Freq: Three times a day (TID) | ORAL | Status: AC | PRN

## 2012-02-12 MED ORDER — PSEUDOEPHEDRINE-GUAIFENESIN ER 120-1200 MG PO TB12: 1.0000 | ORAL_TABLET | Freq: Two times a day (BID) | ORAL | Status: DC

## 2012-02-12 MED ORDER — FLUTICASONE PROPIONATE 50 MCG/ACT NA SUSP: 2.0000 | Freq: Every day | NASAL | Status: DC

## 2012-02-12 MED ORDER — ALUM & MAG HYDROXIDE-SIMETH 200-200-20 MG/5ML PO SUSP: 5.0000 mL | Freq: Four times a day (QID) | ORAL | Status: AC | PRN

## 2012-02-12 MED ORDER — LIDOCAINE VISCOUS 2 % MT SOLN: 10.0000 mL | Freq: Three times a day (TID) | OROMUCOSAL | Status: AC | PRN

## 2012-02-12 NOTE — ED Notes (Signed)
Pt  Reports  Symptoms  Of  sorethroat    r  Side  With  Pain radiating  Up  To r  Ear  Area     She  Also  Reports       Headache  And  Some  Diarrhea  As  Well      Symptoms  X  4  Days

## 2012-02-12 NOTE — ED Provider Notes (Signed)
History     CSN: [id number]  Arrival date & time 02/12/12  1751   First MD Initiated Contact with Patient 02/12/12 1809      Chief Complaint  Patient presents with  . Sore Throat    (Consider location/radiation/quality/duration/timing/severity/associated sxs/prior treatment) HPI Comments: SORE THROAT  Onset: 4 days    Severity: moderate Tried OTC meds without significant relief.  Symptoms:  No Fever but reports chills and feeling "hot".  Reports nausea, no vomiting. + Swollen neck glands    + Cough/URI sxs, right ear pain. No hearing changes, otorrhea. + Myalgias No Headache No Rash     No Recent Strep Exposure, but reports multiple sick contacts with similar symptoms  No Abdominal Pain No reflux sxs No Allergy sxs  No Breathing difficulty No Drooling No Trismus  ROS as noted in HPI. All other ROS negative.    Patient is a 41 y.o. female presenting with pharyngitis. The history is provided by the patient.  Sore Throat    Past Medical History  Diagnosis Date  . IBS (irritable bowel syndrome)   . GERD (gastroesophageal reflux disease)   . Anxiety     Past Surgical History  Procedure Date  . Abdominal hysterectomy     History reviewed. No pertinent family history.  History  Substance Use Topics  . Smoking status: Current Everyday Smoker  . Smokeless tobacco: Not on file  . Alcohol Use: Yes    OB History    Grav Para Term Preterm Abortions TAB SAB Ect Mult Living                  Review of Systems  Allergies  Morphine; Sertraline hcl; and Sulfonamide derivatives  Home Medications   Current Outpatient Rx  Name Route Sig Dispense Refill  . CLONAZEPAM 1 MG PO TABS Oral Take 1 mg by mouth 2 (two) times daily as needed.    . TRAZODONE HCL PO Oral Take by mouth.    Marland Kitchen ALUM & MAG HYDROXIDE-SIMETH 200-200-20 MG/5ML PO SUSP Oral Take 5 mLs by mouth 4 (four) times daily as needed for indigestion. 150 mL 0  . DIPHENHYDRAMINE HCL 12.5 MG/5ML PO  LIQD Oral Take 5 mLs (12.5 mg total) by mouth 4 (four) times daily as needed for allergies. 120 mL 0  . FLUTICASONE PROPIONATE 50 MCG/ACT NA SUSP Nasal Place 2 sprays into the nose daily. 16 g 2  . LIDOCAINE VISCOUS 2 % MT SOLN Oral Take 10 mLs by mouth 3 (three) times daily as needed for pain. Swish and spit. Do not swallow. 100 mL 0  . ONDANSETRON HCL 4 MG PO TABS Oral Take 1 tablet (4 mg total) by mouth every 8 (eight) hours as needed for nausea. May cause constipation 20 tablet 0  . PSEUDOEPHEDRINE-GUAIFENESIN ER [id number] MG PO TB12 Oral Take 1 tablet by mouth 2 (two) times daily. 20 each 0    BP 119/82  Pulse 69  Temp(Src) 98.9 F (37.2 C) (Oral)  Resp 16  SpO2 100%  Physical Exam  Nursing note and vitals reviewed. Constitutional: She is oriented to person, place, and time. She appears well-developed and well-nourished.  HENT:  Head: Normocephalic and atraumatic.  Right Ear: Tympanic membrane and ear canal normal.  Left Ear: Tympanic membrane and ear canal normal.  Nose: Mucosal edema and rhinorrhea present. No epistaxis.  Mouth/Throat: Uvula is midline and mucous membranes are normal. Posterior oropharyngeal erythema present. No oropharyngeal exudate.       (-) frontal,  maxillary sinus tenderness  Eyes: Conjunctivae and EOM are normal. Pupils are equal, round, and reactive to light.  Neck: Normal range of motion.       Shotty cervical lymphadenopathy bilaterally  Cardiovascular: Normal rate, regular rhythm and normal heart sounds.   Pulmonary/Chest: Effort normal and breath sounds normal.  Abdominal: Soft. Bowel sounds are normal. She exhibits no distension.  Musculoskeletal: Normal range of motion.  Neurological: She is alert and oriented to person, place, and time.  Skin: Skin is warm and dry. No rash noted.  Psychiatric: She has a normal mood and affect. Her behavior is normal. Judgment and thought content normal.    ED Course  Procedures (including critical care  time)   Labs Reviewed  POCT RAPID STREP A ([facility]ARE ONLY)   No results found.   1. Pharyngitis       RAS neg  MDM     Luiz Blare, MD 02/12/12 [phone]

## 2012-02-12 NOTE — ED Notes (Signed)
Bed:UCPR1<BR> Expected date:<BR> Expected time:<BR> Means of arrival:<BR> Comments:<BR>

## 2012-02-12 NOTE — Discharge Instructions (Signed)
Make sure drink plenty of fluids. The you may also use a Neti pot or the Lloyd Huger med sinus rinse as such as you want for nasal congestion. Return if you get worse, have any voice changes, if you're unable to keep fluids down, or for any other concerns.

## 2012-05-09 ENCOUNTER — Encounter (HOSPITAL_COMMUNITY): Payer: Self-pay | Admitting: *Deleted

## 2012-05-09 ENCOUNTER — Emergency Department (INDEPENDENT_AMBULATORY_CARE_PROVIDER_SITE_OTHER)
Admission: EM | Admit: 2012-05-09 | Discharge: 2012-05-09 | Disposition: A | Discharge status: Nursing Home (Includes ICF) | Payer: Self-pay | Source: Home / Self Care

## 2012-05-09 ENCOUNTER — Emergency Department (HOSPITAL_COMMUNITY)
Admission: EM | Admit: 2012-05-09 | Discharge: 2012-05-09 | Disposition: A | Discharge status: Home Health | Payer: Self-pay | Attending: Emergency Medicine | Admitting: Emergency Medicine

## 2012-05-09 ENCOUNTER — Emergency Department (HOSPITAL_COMMUNITY): Payer: Self-pay

## 2012-05-09 DIAGNOSIS — R252 Cramp and spasm: Secondary | ICD-10-CM

## 2012-05-09 DIAGNOSIS — M542 Cervicalgia: Secondary | ICD-10-CM | POA: Insufficient documentation

## 2012-05-09 DIAGNOSIS — J039 Acute tonsillitis, unspecified: Secondary | ICD-10-CM | POA: Insufficient documentation

## 2012-05-09 DIAGNOSIS — K219 Gastro-esophageal reflux disease without esophagitis: Secondary | ICD-10-CM | POA: Insufficient documentation

## 2012-05-09 DIAGNOSIS — K589 Irritable bowel syndrome without diarrhea: Secondary | ICD-10-CM | POA: Insufficient documentation

## 2012-05-09 DIAGNOSIS — F411 Generalized anxiety disorder: Secondary | ICD-10-CM | POA: Insufficient documentation

## 2012-05-09 DIAGNOSIS — F17200 Nicotine dependence, unspecified, uncomplicated: Secondary | ICD-10-CM | POA: Insufficient documentation

## 2012-05-09 LAB — BASIC METABOLIC PANEL
BUN: <3 mg/dL — ABNORMAL LOW (ref 6–23)
Calcium: 9.6 mg/dL (ref 8.4–10.5)
GFR calc Af Amer: >90 mL/min (ref >90)
GFR calc non Af Amer: >90 mL/min (ref >90)
Glucose, Bld: 94 mg/dL (ref 70–99)

## 2012-05-09 LAB — CBC
HCT: 43.6 % (ref 36.0–46.0)
Hemoglobin: 15.2 g/dL — ABNORMAL HIGH (ref 12.0–15.0)
MCH: 31.8 pg (ref 26.0–34.0)
MCHC: 34.9 g/dL (ref 30.0–36.0)
MCV: 91.2 fL (ref 78.0–100.0)

## 2012-05-09 MED ORDER — FENTANYL CITRATE 0.05 MG/ML IJ SOLN
50.0000 ug | Freq: Once | INTRAMUSCULAR | Status: AC
  Administered 2012-05-09: 50 ug via INTRAVENOUS
  Filled 2012-05-09: qty 2

## 2012-05-09 MED ORDER — HYDROCODONE-ACETAMINOPHEN 5-325 MG PO TABS
1.0000 | ORAL_TABLET | Freq: Once | ORAL | Status: AC
  Administered 2012-05-09: 1 via ORAL

## 2012-05-09 MED ORDER — KETOROLAC TROMETHAMINE 60 MG/2ML IM SOLN: 60.0000 mg | Freq: Once | INTRAMUSCULAR | Status: DC

## 2012-05-09 MED ORDER — OXYCODONE-ACETAMINOPHEN 5-325 MG PO TABS
1.0000 | ORAL_TABLET | Freq: Once | ORAL | Status: AC
  Administered 2012-05-09: 1 via ORAL
  Filled 2012-05-09: qty 2

## 2012-05-09 MED ORDER — CLINDAMYCIN PHOSPHATE 600 MG/50ML IV SOLN
600.0000 mg | Freq: Once | INTRAVENOUS | Status: DC
  Filled 2012-05-09: qty 50

## 2012-05-09 MED ORDER — HYDROCODONE-ACETAMINOPHEN 5-500 MG PO TABS: 1.0000 | ORAL_TABLET | Freq: Four times a day (QID) | ORAL | Status: AC | PRN

## 2012-05-09 MED ORDER — HYDROCODONE-ACETAMINOPHEN 5-325 MG PO TABS
ORAL_TABLET | ORAL | Status: AC
  Filled 2012-05-09: qty 1

## 2012-05-09 MED ORDER — CLINDAMYCIN HCL 150 MG PO CAPS: 300.0000 mg | ORAL_CAPSULE | Freq: Four times a day (QID) | ORAL | Status: DC

## 2012-05-09 MED ORDER — PENICILLIN V POTASSIUM 500 MG PO TABS: 500.0000 mg | ORAL_TABLET | Freq: Four times a day (QID) | ORAL | Status: AC

## 2012-05-09 MED ORDER — IOHEXOL 300 MG/ML  SOLN
75.0000 mL | Freq: Once | INTRAMUSCULAR | Status: AC | PRN
  Administered 2012-05-09: 75 mL via INTRAVENOUS

## 2012-05-09 MED ORDER — HYDROCODONE-ACETAMINOPHEN 5-500 MG PO TABS: 1.0000 | ORAL_TABLET | Freq: Four times a day (QID) | ORAL | Status: DC | PRN

## 2012-05-09 NOTE — ED Notes (Signed)
Patient transported to CT 

## 2012-05-09 NOTE — ED Provider Notes (Signed)
Medical screening examination/treatment/procedure(s) were performed by PGY-3 [facility] resident and as supervising physician I personally examined patient, discussed case and management with resident physician and was immediately available for consultation/collaboration. Agree with documentation above.  Sharin Grave, MD   Sharin Grave, MD 05/09/12 [phone]

## 2012-05-09 NOTE — ED Notes (Signed)
Sent from [facility].  The pt received a vicodin there

## 2012-05-09 NOTE — ED Notes (Signed)
Lt sided throat and jaw pain since Sunday.  No temp

## 2012-05-09 NOTE — ED Provider Notes (Signed)
History    This chart was scribed for Tricia Bucco, MD, MD by Smitty Pluck. The patient was seen in room TR02C and the patient's care was started at 5:24PM.   CSN: [id number]  Arrival date & time 05/09/12  1541   First MD Initiated Contact with Patient 05/09/12 1629      Chief Complaint  Patient presents with  . Sore Throat    (Consider location/radiation/quality/duration/timing/severity/associated sxs/prior treatment) The history is provided by the patient.   MERLINDA Berry is a 41 y.o. female who presents to the Emergency Department complaining of constant moderate sore throat and cervical adenopathy, left jaw pain onset 4 days ago. Pt reports having nausea, congestion, cough. Denies emesis. Pt reports that underneath her tongue she has "funny feeling" sensation and feels swollen. Reports swallowing aggravates the pain. Was seen at Urgent Care and noted to have trismus so sent over here for further evaluation.  Past Medical History  Diagnosis Date  . IBS (irritable bowel syndrome)   . GERD (gastroesophageal reflux disease)   . Anxiety     Past Surgical History  Procedure Date  . Abdominal hysterectomy     No family history on file.  History  Substance Use Topics  . Smoking status: Current Everyday Smoker  . Smokeless tobacco: Not on file  . Alcohol Use: Yes    OB History    Grav Para Term Preterm Abortions TAB SAB Ect Mult Living                  Review of Systems  Constitutional: Positive for activity change. Negative for fever and chills.  HENT: Positive for sore throat and neck pain. Negative for trouble swallowing and voice change.   Respiratory: Negative for cough and shortness of breath.   Gastrointestinal: Positive for nausea. Negative for vomiting and diarrhea.  Skin: Negative for rash and wound.  Neurological: Negative for headaches.  All other systems reviewed and are negative.    Allergies  Morphine; Sertraline hcl; and Sulfonamide  derivatives  Home Medications   Current Outpatient Rx  Name Route Sig Dispense Refill  . CLONAZEPAM 1 MG PO TABS Oral Take 1 mg by mouth 2 (two) times daily as needed. For anxiety    . CLINDAMYCIN HCL 150 MG PO CAPS Oral Take 2 capsules (300 mg total) by mouth every 6 (six) hours. 56 capsule 0  . DIPHENHYDRAMINE HCL 12.5 MG/5ML PO LIQD Oral Take 5 mLs (12.5 mg total) by mouth 4 (four) times daily as needed for allergies. 120 mL 0  . HYDROCODONE-ACETAMINOPHEN 5-500 MG PO TABS Oral Take 1-2 tablets by mouth every 6 (six) hours as needed for pain. 15 tablet 0    BP 125/80  Pulse 56  Temp 98.3 F (36.8 C) (Oral)  Resp 18  SpO2 100%  Physical Exam  Nursing note and vitals reviewed. Constitutional: She is oriented to person, place, and time. She appears well-developed and well-nourished.  HENT:  Head: Normocephalic and atraumatic.  Right Ear: External ear normal.  Left Ear: External ear normal.  Mouth/Throat: No oropharyngeal exudate or posterior oropharyngeal erythema.       Fullness under tongue on left side.  Swelling of submandibular with moderate trismus No elevation of tongue   Eyes: Pupils are equal, round, and reactive to light.  Neck: Normal range of motion. Neck supple.  Cardiovascular: Normal rate, regular rhythm and normal heart sounds.   Pulmonary/Chest: Effort normal and breath sounds normal. No respiratory distress. She has no  wheezes. She has no rales. She exhibits no tenderness.  Abdominal: Soft. Bowel sounds are normal. There is no tenderness. There is no rebound and no guarding.  Musculoskeletal: Normal range of motion. She exhibits no edema.  Lymphadenopathy:    She has no cervical adenopathy.  Neurological: She is alert and oriented to person, place, and time.  Skin: Skin is warm and dry. No rash noted.  Psychiatric: She has a normal mood and affect.    ED Course  Procedures (including critical care time) DIAGNOSTIC STUDIES: Oxygen Saturation is 100% on  room air, normal by my interpretation.    COORDINATION OF CARE:    Results for orders placed during the hospital encounter of 05/09/12  RAPID STREP SCREEN      Component Value Range   Streptococcus, Group A Screen (Direct) NEGATIVE  NEGATIVE  CBC      Component Value Range   WBC 8.6  4.0 - 10.5 K/uL   RBC 4.78  3.87 - 5.11 MIL/uL   Hemoglobin 15.2 (*) 12.0 - 15.0 g/dL   HCT 40.9  81.1 - 91.4 %   MCV 91.2  78.0 - 100.0 fL   MCH 31.8  26.0 - 34.0 pg   MCHC 34.9  30.0 - 36.0 g/dL   RDW 78.2  95.6 - 21.3 %   Platelets 263  150 - 400 K/uL  BASIC METABOLIC PANEL      Component Value Range   Sodium 142  135 - 145 mEq/L   Potassium 3.9  3.5 - 5.1 mEq/L   Chloride 103  96 - 112 mEq/L   CO2 27  19 - 32 mEq/L   Glucose, Bld 94  70 - 99 mg/dL   BUN <3 (*) 6 - 23 mg/dL   Creatinine, Ser 0.86  0.50 - 1.10 mg/dL   Calcium 9.6  8.4 - 57.8 mg/dL   GFR calc non Af Amer >90  >90 mL/min   GFR calc Af Amer >90  >90 mL/min   Ct Soft Tissue Neck W Contrast  05/09/2012  *RADIOLOGY REPORT*  Clinical Data: The left-sided jaw pain.  Left neck pain.  Swelling below the mandible.  CT NECK WITH CONTRAST  Technique:  Multidetector CT imaging of the neck was performed with intravenous contrast.  Contrast: 75mL OMNIPAQUE IOHEXOL 300 MG/ML  SOLN  Comparison: None.  Findings: Limited imaging of the brain is unremarkable.  No focal mucosal or submucosal lesions are present.  The vocal cords are midline and symmetric.  The thyroid is unremarkable.  Prominent jugulodigastric lymph nodes are present bilaterally.  The left-sided node is larger than the right , measuring at least 2.6 x 1.3 cm.  The largest right-sided node is 18 mm in long axis.  There is some asymmetry of the palatine tonsils with increased enhancement on the left. A discrete mass is not visualized.  The salivary glands are unremarkable.  There are small lymph nodes at the tail of the parotid bilaterally.  The left parotid tail is the area marked.  This  is also overlying the asymmetric left jugulodigastric lymph node.  The bone windows demonstrate to mild endplate degenerative changes at C6-7.  The lung apices are clear.  IMPRESSION:  1.  Asymmetric left level II lymph node subjacent to the area marked.  This may represent an enlarged jugulodigastric lymph node. Although it is ovoid in shape, a metastatic node is not excluded. 2.  Asymmetric enhancement of the left palatine tonsil.  Recommend evaluation of the tonsils.  3.  Minimal degenerative changes in the cervical spine.  Original Report Authenticated By: Jamesetta Orleans. MATTERN, M.D.      1. Tonsillitis       MDM  Pt with sore throat.  Pain on palpation of left mandible over gums, but no teeth in mouth, no evident abscess.  +LAD in submandibular area on CT, likely reactive.  No airway issues or trouble swallowing.  Will start abx, pain meds.  F/u with ENT for reexam.  Advised pt to return here for any worsening symptoms or if she cannot get in to see ENT.  If nodes are still swollen after abx, may need biopsy by ENT.  I personally performed the services described in this documentation, which was scribed in my presence.  The recorded information has been reviewed and considered.        Tricia Bucco, MD 05/09/12 2024

## 2012-05-09 NOTE — ED Notes (Signed)
The pt is blind 

## 2012-05-09 NOTE — ED Notes (Signed)
Pt   Reports  Symptoms      Of   Pain  l  Jaw    Area   As  Well  As  l  Side  Of  Her     Neck     Pt      denys  Any       toothace  As  She  Has  No  Teeth  She  Wears  Dentures         She  Reports  Has  A  sorethroat  As  Well      - pt  Appears  Uncomfortable  Yet  Is  In no  Severe  diistress

## 2012-05-09 NOTE — ED Provider Notes (Signed)
History     CSN: [id number]  Arrival date & time 05/09/12  1237   First MD Initiated Contact with Patient 05/09/12 1259      Chief Complaint  Patient presents with  . Jaw Pain   HPI    [facility] with sore throat, jaw pain, and neck pain.  Sore throat started 4 days ago.  Jaw/neck pain started yesterday.  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  Today, she has difficulty opening her mouth due to pain.  Pain is located below LT TMJ and radiates to LT side of neck.  Patient has not been able to eat in 2 days, but is drinking fluids.  She does not have any teeth, she wears dentures but not using bottom dentures today due to pain.  Patient denies any fevers, chills, night sweats, or nausea/vomiting.     Past Medical History  Diagnosis Date  . IBS (irritable bowel syndrome)   . GERD (gastroesophageal reflux disease)   . Anxiety     Past Surgical History  Procedure Date  . Abdominal hysterectomy     No family history on file.  History  Substance Use Topics  . Smoking status: Current Everyday Smoker  . Smokeless tobacco: Not on file  . Alcohol Use: Yes    Review of Systems  Allergies  Morphine; Sertraline hcl; and Sulfonamide derivatives  Home Medications   Current Outpatient Rx  Name Route Sig Dispense Refill  . CLONAZEPAM 1 MG PO TABS Oral Take 1 mg by mouth 2 (two) times daily as needed.    Marland Kitchen DIPHENHYDRAMINE HCL 12.5 MG/5ML PO LIQD Oral Take 5 mLs (12.5 mg total) by mouth 4 (four) times daily as needed for allergies. 120 mL 0  . FLUTICASONE PROPIONATE 50 MCG/ACT NA SUSP Nasal Place 2 sprays into the nose daily. 16 g 2  . PSEUDOEPHEDRINE-GUAIFENESIN ER [phone] MG PO TB12 Oral Take 1 tablet by mouth 2 (two) times daily. 20 each 0  . TRAZODONE HCL PO Oral Take by mouth.      BP 122/78  Pulse 62  Temp 98.4 F (36.9 C) (Oral)  Resp 18  SpO2 100%  Physical Exam  Constitutional: No distress.  HENT:  Head:  Normocephalic and atraumatic.       Patient unable to open jaw due to pain; unable to visualize oropharynx; no abscess on left side of mouth; no pus collection or drainage  Neck: Normal range of motion. Neck supple.       Enlarged lymph node on LT side of neck, very painful on palpation  Cardiovascular: Normal rate and normal heart sounds.   Pulmonary/Chest: Effort normal and breath sounds normal.  Skin: No rash noted. No erythema.    ED Course  Procedures (including critical care time)  Labs Reviewed - No data to display No results found.   1. Neck pain   2. Trismus       MDM   Patient presents to Urgent Care with neck pain, jaw pain, and trismus concerning for deep abscess. Patient complains of LT side jaw pain and neck pain. On exam, she has was unable to open mouth fully and also found to have large, tender lymph node on LT neck and tenderness on palpation below TMJ.  Patient was given one Norco tablet in office today.  Plan to transfer patient to ED for possible neck CT to rule out abscess/infection.  Barnabas Lister, MD 05/09/12 [phone]

## 2017-10-09 DIAGNOSIS — N390 Urinary tract infection, site not specified: Secondary | ICD-10-CM | POA: Diagnosis not present

## 2018-01-27 ENCOUNTER — Telehealth: Payer: 59 | Admitting: Physician Assistant

## 2018-01-27 DIAGNOSIS — J302 Other seasonal allergic rhinitis: Secondary | ICD-10-CM

## 2018-01-27 MED ORDER — BENZONATATE 100 MG PO CAPS
100.0000 mg | ORAL_CAPSULE | Freq: Three times a day (TID) | ORAL | 0 refills | Status: DC | PRN
Start: 2018-01-27 — End: 2020-08-23

## 2018-01-27 MED ORDER — FLUTICASONE PROPIONATE 50 MCG/ACT NA SUSP: 2.0000 | Freq: Every day | NASAL | 6 refills | Status: DC

## 2018-01-27 NOTE — Progress Notes (Signed)
E visit for Allergic Rhinitis We are sorry that you are not feeling well.  Her is how we plan to help!  Based on what you have shared with me it looks like you have Allergic Rhinitis.  Rhinitis is when a reaction occurs that causes nasal congestion, runny nose, sneezing, and itching.  Most types of rhinitis are caused by an inflammation and are associated with symptoms in the eyes ears or throat. There are several types of rhinitis.  The most common are acute rhinitis, which is usually caused by a viral illness, allergic or seasonal rhinitis, and nonallergic or year-round rhinitis.  Nasal allergies occur certain times of the year.  Allergic rhinitis is caused when allergens in the air trigger the release of histamine in the body.  Histamine causes itching, swelling, and fluid to build up in the fragile linings of the nasal passages, sinuses and eyelids.  An itchy nose and clear discharge are common.  I recommend the following over the counter treatments: You should take a daily dose of antihistamine and Xyzal 5 mg take 1 tablet daily I also would recommend a nasal spray: Flonase 2 sprays into each nostril once daily (A prescription has been sent to the pharmacy)  I have also sent in a prescription cough medication to help with the cough until allergies get under better control with the regimen recommended above.   HOME CARE:   You can use an over-the-counter saline nasal spray as needed  Avoid areas where there is heavy dust, mites, or molds  Stay indoors on windy days during the pollen season  Keep windows closed in home, at least in bedroom; use air conditioner.  Use high-efficiency house air filter  Keep windows closed in car, turn AC on re-circulate  Avoid playing out with dog during pollen season  GET HELP RIGHT AWAY IF:   If your symptoms do not improve within 10 days  You become short of breath  You develop yellow or green discharge from your nose for over 3 days  You  have coughing fits  MAKE SURE YOU:   Understand these instructions  Will watch your condition  Will get help right away if you are not doing well or get worse  Thank you for choosing an e-visit. Your e-visit answers were reviewed by a board certified advanced clinical practitioner to complete your personal care plan. Depending upon the condition, your plan could have included both over the counter or prescription medications. Please review your pharmacy choice. Be sure that the pharmacy you have chosen is open so that you can pick up your prescription now.  If there is a problem you may message your provider in [place] to have the prescription routed to another pharmacy. Your safety is important to [place]. If you have drug allergies check your prescription carefully.  For the next 24 hours, you can use MyChart to ask questions about today's visit, request a non-urgent call back, or ask for a work or school excuse from your e-visit provider. You will get an email in the next two days asking about your experience. I hope that your e-visit has been valuable and will speed your recovery.

## 2018-02-09 ENCOUNTER — Other Ambulatory Visit: Payer: Self-pay

## 2018-02-09 ENCOUNTER — Emergency Department
Admission: EM | Admit: 2018-02-09 | Discharge: 2018-02-09 | Disposition: A | Discharge status: Home / Self Care | Payer: 59 | Attending: Emergency Medicine | Admitting: Emergency Medicine

## 2018-02-09 DIAGNOSIS — Y939 Activity, unspecified: Secondary | ICD-10-CM | POA: Diagnosis not present

## 2018-02-09 DIAGNOSIS — M25511 Pain in right shoulder: Secondary | ICD-10-CM | POA: Insufficient documentation

## 2018-02-09 DIAGNOSIS — Y998 Other external cause status: Secondary | ICD-10-CM | POA: Insufficient documentation

## 2018-02-09 DIAGNOSIS — Y92410 Unspecified street and highway as the place of occurrence of the external cause: Secondary | ICD-10-CM | POA: Insufficient documentation
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
## 2018-03-09 ENCOUNTER — Telehealth: Payer: 59 | Admitting: Nurse Practitioner

## 2018-03-09 DIAGNOSIS — L247 Irritant contact dermatitis due to plants, except food: Secondary | ICD-10-CM | POA: Diagnosis not present

## 2018-03-09 MED ORDER — PREDNISONE 10 MG (21) PO TBPK: ORAL_TABLET | ORAL | 0 refills | Status: DC

## 2018-03-09 NOTE — Progress Notes (Signed)
We are sorry that you are not feeing well.  Here is how we plan to help!  Based on what you have shared with me it looks like you have had an allergic reaction to the oily resin from a group of plants.  This resin is very sticky, so it easily attaches to your skin, clothing, tools equipment, and pet's fur.    This blistering rash is often called poison ivy rash although it can come from contact with the leaves, stems and roots of poison ivy, poison oak and poison sumac.  The oily resin contains urushiol (u-ROO-she-ol) that produces a skin rash on exposed skin.  The severity of the rash depends on the amount of urushiol that gets on your skin.  A section of skin with more urushiol on it may develop a rash sooner.  The rash usually develops 12-48 hours after exposure and can last two to three weeks.  Your skin must come in direct contact with the plant's oil to be affected.  Blister fluid doesn't spread the rash.  However, if you come into contact with a piece of clothing or pet fur that has urushiol on it, the rash may spread out.  You can also transfer the oil to other parts of your body with your fingers.  Often the rash looks like a straight line because of the way the plant brushes against your skin.    I have developed the following plan to treat your condition Since your rash is widespread or has resulted in a large number of blisters, I have prescribed an oral corticosteroid.  Please follow these recommendations:  I have sent a prednisone dose pack to your chosen pharmacy. Be sure to follow the instructions carefully and complete the entire prescription. You may use Benadryl or Caladryl topical lotions to sooth the itch and remember cool, not hot, showers and baths can help relieve the itching!  Place cool, wet compresses on the affected area for 15-30 minutes several times a day.  You may also take oral antihistamines, such as diphenhydramine (Benadryl, others), which may also help you sleep  better.  Watch your skin for any purulent (pus) drainage or red streaking from the site.  If this occurs, contact your provider.  You may require an antibiotic for a skin infection.  Make sure that the clothes you were wearing as well as any towels or sheets that may have come in contact with the oil (urushiol) are washed in detergent and hot water.         What can you do to prevent this rash?  Avoid the plants.  Learn how to identify poison ivy, poison oak and poison sumac in all seasons.  When hiking or engaging in other activities that might expose you to these plants, try to stay on cleared pathways.  If camping, make sure you pitch your tent in an area free of these plants.  Keep pets from running through wooded areas so that urushiol doesn't accidentally stick to their fur, which you may touch.  Remove or kill the plants.  In your yard, you can get rid of poison ivy by applying an herbicide or pulling it out of the ground, including the roots, while wearing heavy gloves.  Afterward remove the gloves and thoroughly wash them and your hands.  Don't burn poison ivy or related plants because the urushiol can be carried by smoke.  Wear protective clothing.  If needed, protect your skin by wearing socks, boots, pants, long   sleeves and vinyl gloves.  Wash your skin right away.  Washing off the oil with soap and water within 30 minutes of exposure may reduce your chances of getting a poison ivy rash.  Even washing after an hour or so can help reduce the severity of the rash.  If you walk through some poison ivy and then later touch your shoes, you may get some urushiol on your hands, which may then transfer to your face or body by touching or rubbing.  If the contaminated object isn't cleaned, the urushiol on it can still cause a skin reaction years later.    Be careful not to reuse towels after you have washed your skin.  Also carefully wash clothing in detergent and hot water to remove all traces of  the oil.  Handle contaminated clothing carefully so you don't transfer the urushiol to yourself, furniture, rugs or appliances.  Remember that pets can carry the oil on their fur and paws.  If you think your pet may be contaminated with urushiol, put on some long rubber gloves and give your pet a bath.  Finally, be careful not to burn these plants as the smoke can contain traces of the oil.  Inhaling the smoke may result in difficulty breathing. If that occurred you should see a physician as soon as possible.  See your doctor right away if:   The reaction is severe or widespread  You inhaled the smoke from burning poison ivy and are having difficulty breathing  Your skin continues to swell  The rash affects your eyes, mouth or genitals  Blisters are oozing pus  You develop a fever greater than 100 F (37.8 C)  The rash doesn't get better within a few weeks.  If you scratch the poison ivy rash, bacteria under your fingernails may cause the skin to become infected.  See your doctor if pus starts oozing from the blisters.  Treatment generally includes antibiotics.  Poison ivy treatments are usually limited to self-care methods.  And the rash typically goes away on its own in two to three weeks.     If the rash is widespread or results in a large number of blisters, your doctor may prescribe an oral corticosteroid, such as prednisone.  If a bacterial infection has developed at the rash site, your doctor may give you a prescription for an oral antibiotic.  MAKE SURE YOU   Understand these instructions.  Will watch your condition.  Will get help right away if you are not doing well or get worse.  Thank you for choosing an e-visit. Your e-visit answers were reviewed by a board certified advanced clinical practitioner to complete your personal care plan. Depending upon the condition, your plan could have included both over the counter or prescription medications.  Please review your  pharmacy choice. If there is a problem you may use MyChart messaging to have the prescription routed to another pharmacy.   Your safety is important to us. If you have drug allergies check your prescription carefully.  You can use MyChart to ask questions about today's visit, request a non-urgent call back, or ask for a work or school excuse for 24 hours related to this e-Visit. If it has been greater than 24 hours you will need to follow up with your provider, or enter a new e-Visit to address those concerns.   You will get an email in the next two days asking about your experience. I hope that your e-visit has been   valuable and will speed your recovery Thank you for choosing an e-visit.      

## 2018-05-05 ENCOUNTER — Telehealth: Payer: 59 | Admitting: Physician Assistant

## 2018-05-05 DIAGNOSIS — R300 Dysuria: Secondary | ICD-10-CM

## 2018-05-05 MED ORDER — NITROFURANTOIN MONOHYD MACRO 100 MG PO CAPS: 100.0000 mg | ORAL_CAPSULE | Freq: Two times a day (BID) | ORAL | 0 refills | Status: DC

## 2018-05-05 NOTE — Progress Notes (Signed)

## 2018-08-03 ENCOUNTER — Telehealth: Payer: 59 | Admitting: Nurse Practitioner

## 2018-08-03 DIAGNOSIS — N3000 Acute cystitis without hematuria: Secondary | ICD-10-CM

## 2018-08-03 MED ORDER — NITROFURANTOIN MONOHYD MACRO 100 MG PO CAPS: 100.0000 mg | ORAL_CAPSULE | Freq: Two times a day (BID) | ORAL | 0 refills | Status: DC

## 2018-08-03 NOTE — Progress Notes (Signed)

## 2018-09-24 ENCOUNTER — Telehealth: Payer: 59 | Admitting: Physician Assistant

## 2018-09-24 DIAGNOSIS — R300 Dysuria: Secondary | ICD-10-CM | POA: Diagnosis not present

## 2018-09-24 NOTE — Progress Notes (Signed)
We are sorry that you are not feeling well.  Here is how we plan to help!  Based on what you shared with me it looks like you most likely have a simple urinary tract infection.  A UTI (Urinary Tract Infection) is a bacterial infection of the bladder.  Most cases of urinary tract infections are simple to treat but a key part of your care is to encourage you to drink plenty of fluids and watch your symptoms carefully.  I have prescribed MacroBid 100 mg twice a day for 5 days.  Your symptoms should gradually improve. Call us if the burning in your urine worsens, you develop worsening fever, back pain or pelvic pain or if your symptoms do not resolve after completing the antibiotic.  Urinary tract infections can be prevented by drinking plenty of water to keep your body hydrated.  Also be sure when you wipe, wipe from front to back and don't hold it in!  If possible, empty your bladder every 4 hours.  Your e-visit answers were reviewed by a board certified advanced clinical practitioner to complete your personal care plan.  Depending on the condition, your plan could have included both over the counter or prescription medications.  If there is a problem please reply once you have received a response from your provider.  Your safety is important to [place].  If you have drug allergies check your prescription carefully.    You can use MyChart to ask questions about today's visit, request a non-urgent call back, or ask for a work or school excuse for 24 hours related to this e-Visit. If it has been greater than 24 hours you will need to follow up with your provider, or enter a new e-Visit to address those concerns.   You will get an e-mail in the next two days asking about your experience.  I hope that your e-visit has been valuable and will speed your recovery. Thank you for using e-visits.    ===View-only below this line===   ----- Message -----    From: [name]    Sent: 09/24/2018  11:27 AM EST      To: E-Visit Mailing List Subject: E-Visit Submission: Urinary Problems  E-Visit Submission: Urinary Problems --------------------------------  Question: Which of the following are you experiencing? Answer:   Pain while passing urine  Question: When you have pain when passing urine, which of these apply? Answer:   I have a burning sensation  Question: Are you able to pass urine? Answer:   Yes, I can pass urine.  Question: How long have you had pain or difficulty passing urine? Answer:   Two days or less  Question: Do you have a fever? Answer:   No, I do not have a fever  Question: Do you have any of the following? Answer:   I have none of these problems  Question: Do you have an exaggerated sensation of the need to pass urine? Answer:   Yes, the sensation is exaggerated  Question: Do you have the urge to urinate more of less frequently than normal? Answer:   More frequently  Question: What does your urine look like? Answer:   I am not sure  Question: Do you have any of the following? Answer:   None of the above  Question: Do you have any of the following? Answer:   No discharge  Question: Do you have any sores on your genitals? Answer:   No  Question: Have you had any kidney  problems in the past? Answer:   No  Question: Within the past 3 months, have you had any surgery on your kidneys or bladder, or have you had a tube inserted to collect your urine? Answer:   No, I have never had either  Question: Have you had similar symptoms in the past? Answer:   Yes, I have had similar symptoms before  Question: If you had similar symptoms in the past, did any of the following work? Answer:   Pills for urine infection  Question: Please list any additional comments  Answer:   Also need info on how to prevent future urinary infections.  Thanks  Question: Please list your medication allergies that you may have ? (If 'none' , please list as 'none') Answer:    Sulfur  Question: Are you pregnant? Answer:   I am confident that I am not pregnant  Question: Are you breastfeeding? Answer:   No

## 2018-09-25 MED ORDER — NITROFURANTOIN MONOHYD MACRO 100 MG PO CAPS: 100.0000 mg | ORAL_CAPSULE | Freq: Two times a day (BID) | ORAL | 0 refills | Status: DC

## 2018-09-25 NOTE — Addendum Note (Signed)
Addended by: Evelina Dun A on: 09/25/2018 03:35 PM   Modules accepted: Orders

## 2019-07-11 ENCOUNTER — Telehealth: Payer: 59 | Admitting: Physician Assistant

## 2019-07-11 DIAGNOSIS — N390 Urinary tract infection, site not specified: Secondary | ICD-10-CM | POA: Diagnosis not present

## 2019-07-11 MED ORDER — CEPHALEXIN 500 MG PO CAPS: 500.0000 mg | ORAL_CAPSULE | Freq: Two times a day (BID) | ORAL | 0 refills | Status: DC

## 2019-07-11 NOTE — Progress Notes (Signed)
We are sorry that you are not feeling well.  Here is how we plan to help!  Based on what you shared with me it looks like you most likely have a simple urinary tract infection.  A UTI (Urinary Tract Infection) is a bacterial infection of the bladder.  Most cases of urinary tract infections are simple to treat but a key part of your care is to encourage you to drink plenty of fluids and watch your symptoms carefully.  I have prescribed  Keflex 500 Mg twice daily for 5 days. Your symptoms should gradually improve. Call us if the burning in your urine worsens, you develop worsening fever, back pain or pelvic pain or if your symptoms do not resolve after completing the antibiotic.  Urinary tract infections can be prevented by drinking plenty of water to keep your body hydrated.  Also be sure when you wipe, wipe from front to back and don't hold it in!  If possible, empty your bladder every 4 hours.  Your e-visit answers were reviewed by a board certified advanced clinical practitioner to complete your personal care plan.  Depending on the condition, your plan could have included both over the counter or prescription medications.  If there is a problem please reply  once you have received a response from your provider.  Your safety is important to [place].  If you have drug allergies check your prescription carefully.    You can use MyChart to ask questions about today's visit, request a non-urgent call back, or ask for a work or school excuse for 24 hours related to this e-Visit. If it has been greater than 24 hours you will need to follow up with your provider, or enter a new e-Visit to address those concerns.   You will get an e-mail in the next two days asking about your experience.  I hope that your e-visit has been valuable and will speed your recovery. Thank you for using e-visits.  Greater than 5 minutes, yet less than 10 minutes of time have been spent researching, coordinating, and  implementing care for this patient today

## 2019-07-24 ENCOUNTER — Telehealth: Payer: 59 | Admitting: Physician Assistant

## 2019-07-24 DIAGNOSIS — N390 Urinary tract infection, site not specified: Secondary | ICD-10-CM

## 2019-07-24 NOTE — Progress Notes (Signed)
Based on what you shared with me, I feel your condition warrants further evaluation and I recommend that you be seen for a face to face office visit.Giving recurring symptoms despite treatment, we really need you to have a urine culture to determine what antibiotics the infection is resistant or sensitive to to make sure the infection gets properly treated.   NOTE: If you entered your credit card information for this eVisit, you will not be charged. You may see a "hold" on your card for the $35 but that hold will drop off and you will not have a charge processed.  If you are having a true medical emergency please call 911.     For an urgent face to face visit, [place] has four urgent care centers for your convenience:   . [facility]    [phone]                  Get Driving Directions  [address], [place] [address] . 10 am to 8 pm Monday-Friday . 12 pm to 8 pm Saturday-Sunday   . [facility] at [place]                  Get Driving Directions  [address], [place], [place] [address] . 8 am to 8 pm Monday-Friday . 9 am to 6 pm Saturday . 11 am to 6 pm Sunday   . [facility] at [place]                  Get Driving Directions   [address] [place], [place] [address] . 8 am to 8 pm Monday-Friday . 8 am to 4 pm Saturday-Sunday    . [facility] at                     Get Driving Directions  [phone]  [address], [place], [place] [address]  . Monday-Friday, 12 PM to 6 PM    Your e-visit answers were reviewed by a board certified advanced clinical practitioner to complete your personal care plan.  Thank you for using e-Visits.

## 2020-06-18 ENCOUNTER — Telehealth: Payer: 59 | Admitting: Nurse Practitioner

## 2020-06-18 DIAGNOSIS — N390 Urinary tract infection, site not specified: Secondary | ICD-10-CM

## 2020-06-18 MED ORDER — NITROFURANTOIN MONOHYD MACRO 100 MG PO CAPS: 100.0000 mg | ORAL_CAPSULE | Freq: Two times a day (BID) | ORAL | 0 refills | Status: AC

## 2020-06-18 NOTE — Progress Notes (Signed)
We are sorry that you are not feeling well.  Here is how we plan to help!  Based on what you shared with me it looks like you most likely have a simple urinary tract infection.  A UTI (Urinary Tract Infection) is a bacterial infection of the bladder.  Most cases of urinary tract infections are simple to treat but a key part of your care is to encourage you to drink plenty of fluids and watch your symptoms carefully.  I have prescribed MacroBid 100 mg twice a day for 5 days.  Your symptoms should gradually improve. Call us if the burning in your urine worsens, you develop worsening fever, back pain or pelvic pain or if your symptoms do not resolve after completing the antibiotic.  Urinary tract infections can be prevented by drinking plenty of water to keep your body hydrated.  Also be sure when you wipe, wipe from front to back and don't hold it in!  If possible, empty your bladder every 4 hours.  Your e-visit answers were reviewed by a board certified advanced clinical practitioner to complete your personal care plan.  Depending on the condition, your plan could have included both over the counter or prescription medications.  If there is a problem please reply  once you have received a response from your provider.  Your safety is important to [place].  If you have drug allergies check your prescription carefully.    You can use MyChart to ask questions about today's visit, request a non-urgent call back, or ask for a work or school excuse for 24 hours related to this e-Visit. If it has been greater than 24 hours you will need to follow up with your provider, or enter a new e-Visit to address those concerns.   You will get an e-mail in the next two days asking about your experience.  I hope that your e-visit has been valuable and will speed your recovery. Thank you for using e-visits.  I have spent at least 5 minutes reviewing and documenting in the patient's chart.

## 2020-08-13 ENCOUNTER — Telehealth: Payer: 59 | Admitting: Nurse Practitioner

## 2020-08-13 DIAGNOSIS — M545 Low back pain, unspecified: Secondary | ICD-10-CM | POA: Diagnosis not present

## 2020-08-13 MED ORDER — CYCLOBENZAPRINE HCL 10 MG PO TABS: 10.0000 mg | ORAL_TABLET | Freq: Three times a day (TID) | ORAL | 0 refills | Status: DC | PRN

## 2020-08-13 MED ORDER — NAPROXEN 500 MG PO TABS: 500.0000 mg | ORAL_TABLET | Freq: Two times a day (BID) | ORAL | 0 refills | Status: DC

## 2020-08-13 NOTE — Progress Notes (Signed)

## 2020-08-23 ENCOUNTER — Encounter: Payer: Self-pay | Admitting: Certified Nurse Midwife

## 2020-08-23 ENCOUNTER — Ambulatory Visit (INDEPENDENT_AMBULATORY_CARE_PROVIDER_SITE_OTHER): Payer: 59 | Admitting: Certified Nurse Midwife

## 2020-08-23 ENCOUNTER — Other Ambulatory Visit: Payer: Self-pay

## 2020-08-23 VITALS — BP 133/86 | HR 88 | Ht 60.0 in | Wt 122.3 lb

## 2020-08-23 DIAGNOSIS — N6489 Other specified disorders of breast: Secondary | ICD-10-CM | POA: Diagnosis not present

## 2020-08-23 DIAGNOSIS — N611 Abscess of the breast and nipple: Secondary | ICD-10-CM

## 2020-08-23 DIAGNOSIS — Z23 Encounter for immunization: Secondary | ICD-10-CM | POA: Diagnosis not present

## 2020-08-23 MED ORDER — CEPHALEXIN 500 MG PO CAPS: 500.0000 mg | ORAL_CAPSULE | Freq: Four times a day (QID) | ORAL | 0 refills | Status: AC

## 2020-08-23 MED ORDER — TETANUS-DIPHTH-ACELL PERTUSSIS 5-2.5-18.5 LF-MCG/0.5 IM SUSY
0.5000 mL | PREFILLED_SYRINGE | Freq: Once | INTRAMUSCULAR | Status: AC
  Administered 2020-08-23: 0.5 mL via INTRAMUSCULAR

## 2020-08-23 NOTE — Addendum Note (Signed)
Addended by: Raliegh Ip on: 08/23/2020 11:54 AM   Modules accepted: Orders

## 2020-08-23 NOTE — Progress Notes (Signed)
GYN ENCOUNTER NOTE  Subjective:       Tricia Berry is a 49 y.o. G94P2012 female is here for gynecologic evaluation of the following issues:  1. Left breast mass x 1 year that has opened up. Has been open for several months. She denies fever, tenderness. She has some bleeding with a shower. She denies trama to the area. She is unsure of a bite, states that she had done some " cat trapping" so bit is a potential . 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     Gynecologic History No LMP recorded. Patient has had a hysterectomy. Contraception: none Last mammogram: 2013 Results were: normal  Obstetric History OB History  Gravida Para Term Preterm AB Living  3 2 2   1 2   SAB TAB Ectopic Multiple Live Births          2    # Outcome Date GA Lbr Len/2nd Weight Sex Delivery Anes PTL Lv  3 AB 2001          2 Term 02/11/91   7 lb 5 oz (3.317 kg)  Vag-Spont  N LIV  1 Term 03/25/87   6 lb 7 oz (2.92 kg)  Vag-Spont  N LIV    Past Medical History:  Diagnosis Date   Anxiety    GERD (gastroesophageal reflux disease)    IBS (irritable bowel syndrome)     Past Surgical History:  Procedure Laterality Date   ABDOMINAL HYSTERECTOMY      Current Outpatient Medications on File Prior to Visit  Medication Sig Dispense Refill   cyclobenzaprine (FLEXERIL) 10 MG tablet Take 1 tablet (10 mg total) by mouth 3 (three) times daily as needed for muscle spasms. 30 tablet 0   diphenhydrAMINE (BENADRYL) 12.5 MG/5ML liquid Take 5 mLs (12.5 mg total) by mouth 4 (four) times daily as needed for allergies. 120 mL 0   No current facility-administered medications on file prior to visit.    Allergies  Allergen Reactions   Morphine Other (See Comments)    migranes   Sertraline Hcl     REACTION: Worsened symptoms of IBS   Sulfamethoxazole Rash   Sulfonamide Derivatives Rash    Social History   Socioeconomic History   Marital status: Divorced    Spouse name: Not on file    Number of children: Not on file   Years of education: Not on file   Highest education level: Not on file  Occupational History   Not on file  Tobacco Use   Smoking status: Current Every Day Smoker   Smokeless tobacco: Never Used  Substance and Sexual Activity   Alcohol use: Yes   Drug use: No   Sexual activity: Not Currently    Birth control/protection: None  Other Topics Concern   Not on file  Social History Narrative   Not on file   Social Determinants of Health   Financial Resource Strain:    Difficulty of Paying Living Expenses: Not on file  Food Insecurity:    Worried About Running Out of Food in the Last Year: Not on file   Ran Out of Food in the Last Year: Not on file  Transportation Needs:    Lack of Transportation (Medical): Not on file   Lack of Transportation (Non-Medical): Not on file  Physical Activity:    Days of Exercise per Week: Not on file   Minutes of Exercise per Session: Not on file  Stress:  Feeling of Stress : Not on file  Social Connections:    Frequency of Communication with Friends and Family: Not on file   Frequency of Social Gatherings with Friends and Family: Not on file   Attends Religious Services: Not on file   Active Member of Clubs or Organizations: Not on file   Attends Archivist Meetings: Not on file   Marital Status: Not on file  Intimate Partner Violence:    Fear of Current or Ex-Partner: Not on file   Emotionally Abused: Not on file   Physically Abused: Not on file   Sexually Abused: Not on file    No family history on file.  The following portions of the patient's history were reviewed and updated as appropriate: allergies, current medications, past family history, past medical history, past social history, past surgical history and problem list.  Review of Systems Review of Systems - Negative except as mentioned in HPI Review of Systems - General ROS: negative for - chills,  fatigue, fever, hot flashes, malaise or night sweats Hematological and Lymphatic ROS: negative for - bleeding problems or swollen lymph nodes Gastrointestinal ROS: negative for - abdominal pain, blood in stools, change in bowel habits and nausea/vomiting Musculoskeletal ROS: negative for - joint pain, muscle pain or muscular weakness Genito-Urinary ROS: negative for - change in menstrual cycle, dysmenorrhea, dyspareunia, dysuria, genital discharge, genital ulcers, hematuria, incontinence, irregular/heavy menses, nocturia or pelvic painjj  Objective:   BP 133/86    Pulse 88    Ht 5' (1.524 m)    Wt 122 lb 5 oz (55.5 kg)    BMI 23.89 kg/m  CONSTITUTIONAL: Well-developed, well-nourished female in no acute distress.  HENT:  Normocephalic, atraumatic.  NECK: Normal range of motion, supple, no masses.  Normal thyroid.  SKIN: Skin is warm and dry. No rash noted. Not diaphoretic. No erythema. No pallor. Scott: Alert and oriented to person, place, and time. PSYCHIATRIC: Normal mood and affect. Normal behavior. Normal judgment and thought content. CARDIOVASCULAR:Not Examined RESPIRATORY: Not Examined BREASTS: Breasts: Left breast abscess and mass noted open wound. Approximately 2 o'clock 1 cm from nipple .open ulcer no drainage. Bleeding with culture noted.  No redness around mass . Mild tenderness on exam , not hot to touch.  ABDOMEN: Soft, non distended; Non tender.  No Organomegaly. PELVIC:not indicated  MUSCULOSKELETAL: Normal range of motion. No tenderness.  No cyanosis, clubbing, or edema.     Assessment:   1. Abscess of breast  - CBC with Differential/Platelet   -aerobic  And anaerobic culture   Plan:   Orders placed for cbc, culture, u/s of left breast. Antibiotics ordered. Self help measures reviewed. Will follow up with results.   Philip Aspen, CNM

## 2020-08-24 ENCOUNTER — Other Ambulatory Visit: Payer: Self-pay | Admitting: Certified Nurse Midwife

## 2020-08-24 DIAGNOSIS — N6320 Unspecified lump in the left breast, unspecified quadrant: Secondary | ICD-10-CM

## 2020-08-24 DIAGNOSIS — Z1231 Encounter for screening mammogram for malignant neoplasm of breast: Secondary | ICD-10-CM

## 2020-08-24 LAB — CBC WITH DIFFERENTIAL/PLATELET
Basophils Absolute: 0.1 10*3/uL (ref 0.0–0.2)
Basos: 1 %
EOS (ABSOLUTE): 0.1 10*3/uL (ref 0.0–0.4)
Eos: 2 %
Hematocrit: 42.9 % (ref 34.0–46.6)
Hemoglobin: 14.3 g/dL (ref 11.1–15.9)
Immature Grans (Abs): 0 10*3/uL (ref 0.0–0.1)
Immature Granulocytes: 0 %
Lymphocytes Absolute: 4.2 10*3/uL — ABNORMAL HIGH (ref 0.7–3.1)
Lymphs: 55 %
MCH: 30.2 pg (ref 26.6–33.0)
MCHC: 33.3 g/dL (ref 31.5–35.7)
MCV: 91 fL (ref 79–97)
Monocytes Absolute: 0.5 10*3/uL (ref 0.1–0.9)
Monocytes: 7 %
Neutrophils Absolute: 2.7 10*3/uL (ref 1.4–7.0)
Neutrophils: 35 %
Platelets: 222 10*3/uL (ref 150–450)
RBC: 4.73 x10E6/uL (ref 3.77–5.28)
RDW: 13.1 % (ref 11.7–15.4)
WBC: 7.6 10*3/uL (ref 3.4–10.8)

## 2020-08-24 NOTE — Addendum Note (Signed)
Addended by: Hildred Priest on: 08/24/2020 04:32 PM   Modules accepted: Orders

## 2020-08-25 NOTE — Telephone Encounter (Signed)
Patient called in stating she saw the message that you had sent her via MyChart and she is unsure why she needs to consult to have surgery. Patient states that she hasn't had her mammogram yet and is unsure whether or not theres something you guys haven't told her or what the sense of urgency for this matter is. Could you please advise?

## 2020-08-28 LAB — ANAEROBIC AND AEROBIC CULTURE

## 2020-09-08 ENCOUNTER — Ambulatory Visit
Admission: RE | Admit: 2020-09-08 | Discharge: 2020-09-08 | Disposition: A | Discharge status: Home / Self Care | Payer: 59 | Source: Ambulatory Visit | Attending: Certified Nurse Midwife | Admitting: Certified Nurse Midwife

## 2020-09-08 ENCOUNTER — Other Ambulatory Visit: Payer: Self-pay | Admitting: Certified Nurse Midwife

## 2020-09-08 ENCOUNTER — Other Ambulatory Visit: Payer: Self-pay

## 2020-09-08 DIAGNOSIS — N6320 Unspecified lump in the left breast, unspecified quadrant: Secondary | ICD-10-CM

## 2020-09-08 DIAGNOSIS — Z1231 Encounter for screening mammogram for malignant neoplasm of breast: Secondary | ICD-10-CM

## 2020-09-08 DIAGNOSIS — N611 Abscess of the breast and nipple: Secondary | ICD-10-CM | POA: Insufficient documentation

## 2020-09-08 DIAGNOSIS — N6489 Other specified disorders of breast: Secondary | ICD-10-CM | POA: Diagnosis not present

## 2020-09-08 DIAGNOSIS — R928 Other abnormal and inconclusive findings on diagnostic imaging of breast: Secondary | ICD-10-CM | POA: Diagnosis not present

## 2020-09-08 MED ORDER — HYDROXYZINE PAMOATE 50 MG PO CAPS: 50.0000 mg | ORAL_CAPSULE | Freq: Four times a day (QID) | ORAL | 0 refills | Status: DC | PRN

## 2020-09-09 ENCOUNTER — Telehealth: Payer: Self-pay

## 2020-09-09 ENCOUNTER — Other Ambulatory Visit: Payer: Self-pay | Admitting: Certified Nurse Midwife

## 2020-09-09 DIAGNOSIS — R928 Other abnormal and inconclusive findings on diagnostic imaging of breast: Secondary | ICD-10-CM

## 2020-09-09 DIAGNOSIS — N6320 Unspecified lump in the left breast, unspecified quadrant: Secondary | ICD-10-CM

## 2020-09-09 NOTE — Telephone Encounter (Signed)
Okay to move up appt to first follow up slot .please do NOT take HFU or same day

## 2020-09-09 NOTE — Telephone Encounter (Signed)
Would you be okay with my moving up patient's appointment to first available?

## 2020-09-09 NOTE — Telephone Encounter (Signed)
Pt called and states that she does not have a PCP and has an appt scheduled with Mable Paris in January to establish care. She states that she has just gotten news that she may have breast cancer and has had bad anxiety since receiving news.  She states that one of her doctor's told her that her PCP would have to prescribe her something for her nerves. Please advise.

## 2020-09-09 NOTE — Telephone Encounter (Signed)
Per Dr Celine Ahr patient will need to have additional imaging and biopsy done prior to follow up appointment here in the office. Spoke with Sam at [facility] and she states patient was in their office today and orders were placed for additional imaging and biopsy and that their facility will contact the patient to get scheduled for appointment. Attempted to contact patient to notify her of recent recommendations per Dr Celine Ahr and there was no response nor could a voice message be left due to voicemail boxes not being set-up to leave voice message. Appointment with Dr.Cannon is cancelled for Tuesday November 23rd, 2021 due to additional imaging and biopsy prior to appointment.

## 2020-09-09 NOTE — Telephone Encounter (Signed)
Just FYI I scheduled patient as an OV because I could not do a new patient appointment in the afternoon. It would not let me change. Pt is scheduled 12/15 at 3:30p. When I spoke with patient she asked if I felt like urgent care could help give her something for her anxiety so she could at least get back to work. I encouraged patient to do this & to be seen. I thought possibly they may could help her short term with something & possibly refer her. She stated that she would try going to one of the urgent cares to see. I told patient that I would keep her in mind if an appointment became available sooner.

## 2020-09-10 ENCOUNTER — Telehealth: Payer: Self-pay | Admitting: General Surgery

## 2020-09-10 NOTE — Telephone Encounter (Signed)
Pt called concerned that she recv'd notification from [facility] that her upcoming appt w/Dr. Celine Ahr was cxl'd.  After review of the pt's chart, she was advised the appt was cxl'd per Dr. Glenford Peers request & will wait to be resch'd once the pt has had her additional imagining/bx w/[place].  Furthermore, Tricia Berry is aware an outreach was attempted but no v/m has been set up, therefore the [name] was unable to leave a message.  Pt acknowledged understanding & thanked.

## 2020-09-10 NOTE — Telephone Encounter (Signed)
Noted  Agree with advice to go to urgent care

## 2020-09-14 ENCOUNTER — Ambulatory Visit: Payer: Self-pay | Admitting: General Surgery

## 2020-09-15 ENCOUNTER — Ambulatory Visit
Admission: RE | Admit: 2020-09-15 | Discharge: 2020-09-15 | Disposition: A | Discharge status: Home / Self Care | Payer: 59 | Source: Ambulatory Visit | Attending: Certified Nurse Midwife | Admitting: Certified Nurse Midwife

## 2020-09-15 ENCOUNTER — Other Ambulatory Visit: Payer: Self-pay

## 2020-09-15 DIAGNOSIS — N6321 Unspecified lump in the left breast, upper outer quadrant: Secondary | ICD-10-CM | POA: Diagnosis not present

## 2020-09-15 DIAGNOSIS — N6320 Unspecified lump in the left breast, unspecified quadrant: Secondary | ICD-10-CM

## 2020-09-15 DIAGNOSIS — R928 Other abnormal and inconclusive findings on diagnostic imaging of breast: Secondary | ICD-10-CM

## 2020-09-15 DIAGNOSIS — C50412 Malignant neoplasm of upper-outer quadrant of left female breast: Secondary | ICD-10-CM | POA: Diagnosis not present

## 2020-09-15 DIAGNOSIS — R590 Localized enlarged lymph nodes: Secondary | ICD-10-CM | POA: Diagnosis not present

## 2020-09-15 DIAGNOSIS — Z7689 Persons encountering health services in other specified circumstances: Secondary | ICD-10-CM | POA: Diagnosis not present

## 2020-09-15 HISTORY — PX: BREAST BIOPSY: SHX20

## 2020-09-17 LAB — SURGICAL PATHOLOGY

## 2020-09-17 NOTE — Telephone Encounter (Signed)
Phone call placed to discuss pathology report . No answer, left message.   Philip Aspen, CNM

## 2020-09-19 NOTE — Telephone Encounter (Signed)
Called pt to discuss pathology report and next steps. All questions answered.    Philip Aspen, CNM

## 2020-09-21 ENCOUNTER — Encounter: Payer: Self-pay | Admitting: *Deleted

## 2020-09-21 ENCOUNTER — Other Ambulatory Visit: Payer: Self-pay | Admitting: Certified Nurse Midwife

## 2020-09-21 DIAGNOSIS — C50912 Malignant neoplasm of unspecified site of left female breast: Secondary | ICD-10-CM

## 2020-09-21 NOTE — Progress Notes (Signed)
Received message yesterday from Electa Sniff, RN that patient was aware of her new diagnosis of invasive breast cancer and was ready for navigation.  Talked to patient yesterday afternoon and she would like to have a surgical consult in [place] and a female oncologist here. 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   I have scheduled her to see Dr. Janese Banks for medical oncology on Friday 09/24/20 @ 1:30.  Will give her educational material at that time.  I have talked to [place] at [facility] and she is working on an appointment for the patient.  She was encouraged to call with any questions or needs.

## 2020-09-23 ENCOUNTER — Encounter: Payer: Self-pay | Admitting: *Deleted

## 2020-09-23 NOTE — Progress Notes (Signed)
Called patient today to confirm surgical consult on Tuesday with [name] at [facility].  Patient is agreeable.  Reviewed diagnosis and answered questions.  Offered support.  She is interested in [facility].  Left Max Fickle a message to schedule patient.  Al Pimple, RN to meet with paitent and give educational material at her consult with Dr. Janese Banks tomorrow.

## 2020-09-24 ENCOUNTER — Inpatient Hospital Stay: Payer: 59

## 2020-09-24 ENCOUNTER — Telehealth: Payer: Self-pay

## 2020-09-24 ENCOUNTER — Other Ambulatory Visit: Payer: Self-pay | Admitting: *Deleted

## 2020-09-24 ENCOUNTER — Inpatient Hospital Stay: Payer: 59 | Attending: Oncology | Admitting: Oncology

## 2020-09-24 ENCOUNTER — Telehealth: Payer: Self-pay | Admitting: *Deleted

## 2020-09-24 ENCOUNTER — Encounter: Payer: Self-pay | Admitting: Oncology

## 2020-09-24 VITALS — BP 139/87 | HR 83 | Temp 98.3°F | Resp 16 | Ht 60.0 in | Wt 122.5 lb

## 2020-09-24 DIAGNOSIS — Z5111 Encounter for antineoplastic chemotherapy: Secondary | ICD-10-CM | POA: Insufficient documentation

## 2020-09-24 DIAGNOSIS — Z7189 Other specified counseling: Secondary | ICD-10-CM

## 2020-09-24 DIAGNOSIS — Z79899 Other long term (current) drug therapy: Secondary | ICD-10-CM | POA: Insufficient documentation

## 2020-09-24 DIAGNOSIS — C50412 Malignant neoplasm of upper-outer quadrant of left female breast: Secondary | ICD-10-CM

## 2020-09-24 DIAGNOSIS — R110 Nausea: Secondary | ICD-10-CM | POA: Diagnosis not present

## 2020-09-24 DIAGNOSIS — F419 Anxiety disorder, unspecified: Secondary | ICD-10-CM | POA: Diagnosis not present

## 2020-09-24 DIAGNOSIS — G893 Neoplasm related pain (acute) (chronic): Secondary | ICD-10-CM | POA: Diagnosis not present

## 2020-09-24 DIAGNOSIS — Z90710 Acquired absence of both cervix and uterus: Secondary | ICD-10-CM

## 2020-09-24 DIAGNOSIS — C50912 Malignant neoplasm of unspecified site of left female breast: Secondary | ICD-10-CM

## 2020-09-24 DIAGNOSIS — Z170 Estrogen receptor positive status [ER+]: Secondary | ICD-10-CM | POA: Diagnosis not present

## 2020-09-24 DIAGNOSIS — C3490 Malignant neoplasm of unspecified part of unspecified bronchus or lung: Secondary | ICD-10-CM | POA: Diagnosis not present

## 2020-09-24 DIAGNOSIS — Z801 Family history of malignant neoplasm of trachea, bronchus and lung: Secondary | ICD-10-CM

## 2020-09-24 DIAGNOSIS — L98499 Non-pressure chronic ulcer of skin of other sites with unspecified severity: Secondary | ICD-10-CM

## 2020-09-24 MED ORDER — OXYCODONE HCL 5 MG PO TABS
5.0000 mg | ORAL_TABLET | Freq: Four times a day (QID) | ORAL | 0 refills | Status: DC | PRN
Start: 2020-09-24 — End: 2020-11-04

## 2020-09-24 NOTE — Telephone Encounter (Signed)
[facility] regarding her FMLA I had received via the fax. Provided call back number.

## 2020-09-24 NOTE — Progress Notes (Signed)
Supported patient and husband at initial Med/Onc visit with Dr. Janese Banks.  Care Plan Summary ,and Breast Cancer Treatment Handbook given to patient.  Notified Burgess Estelle of patient wish to begin smoking cessation class.  Upcoming appointments to be called to patient.

## 2020-09-24 NOTE — Telephone Encounter (Signed)
Pt called to office and said the pain med was not at pharmacy. I checked with Dr. Janese Banks and she has to do a fingerprint to allow the narcotic to go through. Dr. Janese Banks is in route to get home. It should take her 25 min and then she will send the approval over. Pt. Understands.
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
## 2020-09-26 ENCOUNTER — Encounter: Payer: Self-pay | Admitting: Oncology

## 2020-09-26 DIAGNOSIS — Z0000 Encounter for general adult medical examination without abnormal findings: Secondary | ICD-10-CM | POA: Insufficient documentation

## 2020-09-26 DIAGNOSIS — C50412 Malignant neoplasm of upper-outer quadrant of left female breast: Secondary | ICD-10-CM | POA: Insufficient documentation

## 2020-09-26 DIAGNOSIS — C50912 Malignant neoplasm of unspecified site of left female breast: Secondary | ICD-10-CM | POA: Insufficient documentation

## 2020-09-26 DIAGNOSIS — Z7189 Other specified counseling: Secondary | ICD-10-CM | POA: Insufficient documentation

## 2020-09-26 NOTE — Progress Notes (Signed)
Hematology/Oncology Consult note [facility] Telephone:([phone] [phone]  Patient Care Team: Aldona Bar, MD as PCP - General (Infectious Diseases)   Name of the patient: Tricia Berry  [id number]  September 12, 1971    Reason for referral- new diagnosis of breast cancer   Referring physician- Dr. Aldona Bar  Date of visit: 09/26/20   History of presenting illness-patient is a 49 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  She then noticed that her lump is gradually getting larger with an area of ulceration on the skin and underwent a diagnostic bilateral mammogram on 09/08/2020 which showed hypoechoic interconnected masses in the left breast from 10:00 to 2 o'clock position spanning at least an area of 4.8 cm.  Ultrasound also showed 5 abnormal lymph nodes in the axilla with cortical thickening.  Both the mass and the lymph nodes were biopsied and was consistent with invasive mammary carcinoma grade 2.  Tumor was ER +91 200%, PR +11 to 20% and HER-2 negative.  Patient will be meeting [name] from [facility] surgery to discuss surgical management.  She is here for medical oncology recommendations.  In terms of her general health patient is otherwise doing well and does not have significant comorbidities.  She does endorse significant pain in the area of her left breast.  Tylenol and Motrin has not been helping her with this pain.  She lives with her husband and 2 children who have special needs.  No prior history of abnormal breast mammograms or breast biopsies.  She is G3, P2 L2.  She has had a hysterectomy in the past.  Family history significant for lung cancer in her maternal uncle and possible history of GYN malignancy in her maternal grandmother.  ECOG PS- 0  Pain scale-  4   Review of systems- Review of Systems  Constitutional: Negative for chills, fever, malaise/fatigue and weight loss.  HENT: Negative for congestion, ear discharge and nosebleeds.   Eyes: Negative for blurred vision.  Respiratory: Negative for cough, hemoptysis, sputum production, shortness of breath and wheezing.   Cardiovascular: Negative for chest pain, palpitations, orthopnea and claudication.  Gastrointestinal: Negative for abdominal pain, blood in stool, constipation, diarrhea, heartburn, melena, nausea and vomiting.  Genitourinary: Negative for dysuria, flank pain, frequency, hematuria and urgency.  Musculoskeletal: Negative for back pain, joint pain and myalgias.  Skin: Negative for rash.  Neurological: Negative for dizziness, tingling, focal weakness, seizures, weakness and headaches.  Endo/Heme/Allergies: Does not bruise/bleed easily.  Psychiatric/Behavioral: Negative for depression and suicidal ideas. The patient is nervous/anxious. The patient does not have insomnia.    Left breast pain   Allergies  Allergen Reactions  . Morphine Other (See Comments)    migranes  . Sertraline Hcl     REACTION: Worsened symptoms of IBS  . Sulfamethoxazole Rash  . Sulfonamide Derivatives Rash    Patient Active Problem List   Diagnosis Date Noted  . BACK STRAIN, LUMBAR 03/08/2010  . INTERNAL HEMORRHOIDS 01/27/2010  . ANAL FISSURE 01/27/2010  . OSTEOARTHRITIS, HANDS, BILATERAL 01/04/2010  . ARTHRALGIA 10/05/2009  . INSOMNIA, CHRONIC 05/06/2009  . ALLERGIC RHINITIS, SEASONAL 02/09/2009  . TOBACCO ABUSE 12/29/2008  . IBS 10/13/2008  . ANXIETY 08/25/2008  . DEPRESSION, RECURRENT 08/25/2008     Past Medical History:  Diagnosis Date  . Anxiety   .  Breast cancer ([place])   . Diverticulitis   . GERD (gastroesophageal reflux disease)   . IBS (irritable bowel syndrome)   . Scoliosis      Past Surgical History:  Procedure Laterality Date  . ABDOMINAL HYSTERECTOMY    . BREAST  BIOPSY Left 09/15/2020   [place] bx of mass, path pending, Q marker  . BREAST BIOPSY Left 09/15/2020   [place] bx of LN, hydromarker, path pending    Social History   Socioeconomic History  . Marital status: Divorced    Spouse name: Not on file  . Number of children: Not on file  . Years of education: Not on file  . Highest education level: Not on file  Occupational History  . Not on file  Tobacco Use  . Smoking status: Current Every Day Smoker  . Smokeless tobacco: Never Used  Substance and Sexual Activity  . Alcohol use: Yes  . Drug use: No  . Sexual activity: Not Currently    Birth control/protection: None  Other Topics Concern  . Not on file  Social History Narrative  . Not on file   Social Determinants of Health   Financial Resource Strain:   . Difficulty of Paying Living Expenses: Not on file  Food Insecurity:   . Worried About Charity fundraiser in the Last Year: Not on file  . Ran Out of Food in the Last Year: Not on file  Transportation Needs:   . Lack of Transportation (Medical): Not on file  . Lack of Transportation (Non-Medical): Not on file  Physical Activity:   . Days of Exercise per Week: Not on file  . Minutes of Exercise per Session: Not on file  Stress:   . Feeling of Stress : Not on file  Social Connections:   . Frequency of Communication with Friends and Family: Not on file  . Frequency of Social Gatherings with Friends and Family: Not on file  . Attends Religious Services: Not on file  . Active Member of Clubs or Organizations: Not on file  . Attends Archivist Meetings: Not on file  . Marital Status: Not on file  Intimate Partner Violence:   . Fear of Current or Ex-Partner: Not on file  . Emotionally Abused: Not on file  . Physically Abused: Not on file  . Sexually Abused: Not on file     Family History  Problem Relation Age of Onset  . Hypertension Mother   . Osteoarthritis Mother   . Diverticulitis Mother   . Heart failure Father    . Hypertension Father   . Gout Father   . Diverticulitis Brother   . Lung cancer Maternal Uncle   . Lung cancer Maternal Uncle      Current Outpatient Medications:  .  hydrOXYzine (VISTARIL) 50 MG capsule, Take 1 capsule (50 mg total) by mouth 4 (four) times daily as needed for anxiety., Disp: 30 capsule, Rfl: 0 .  Lactobacillus (PROBIOTIC ACIDOPHILUS PO), Take 1 capsule by mouth daily., Disp: , Rfl:  .  Multiple Vitamins-Minerals (MULTIVITAL PO), Take 1 Dose by mouth daily., Disp: , Rfl:  .  cyclobenzaprine (FLEXERIL) 10 MG tablet, Take 1 tablet (10 mg total) by mouth 3 (three) times daily as needed for muscle spasms. (Patient not taking: Reported on 09/24/2020), Disp: 30 tablet, Rfl: 0 .  oxyCODONE (OXY IR/ROXICODONE) 5 MG immediate release tablet, Take 1 tablet (5 mg total) by mouth every 6 (six) hours as needed for severe pain., Disp: 120 tablet,  Rfl: 0   Physical exam:  Vitals:   09/24/20 1418  BP: 139/87  Pulse: 83  Resp: 16  Temp: 98.3 F (36.8 C)  TempSrc: Tympanic  Weight: 122 lb 8 oz (55.6 kg)  Height: 5' (1.524 m)   Physical Exam Eyes:     Pupils: Pupils are equal, round, and reactive to light.  Cardiovascular:     Rate and Rhythm: Normal rate and regular rhythm.     Heart sounds: Normal heart sounds.  Pulmonary:     Effort: Pulmonary effort is normal.     Breath sounds: Normal breath sounds.  Chest:    Abdominal:     General: Bowel sounds are normal.     Palpations: Abdomen is soft.  Musculoskeletal:     Cervical back: Normal range of motion.  Skin:    General: Skin is warm and dry.  Neurological:     Mental Status: She is alert and oriented to person, place, and time.        CMP Latest Ref Rng & Units 05/09/2012  Glucose 70 - 99 mg/dL 94  BUN 6 - 23 mg/dL <3(L)  Creatinine 0.50 - 1.10 mg/dL 0.63  Sodium 135 - 145 mEq/L 142  Potassium 3.5 - 5.1 mEq/L 3.9  Chloride 96 - 112 mEq/L 103  CO2 19 - 32 mEq/L 27  Calcium 8.4 - 10.5 mg/dL 9.6  Total  Protein 6.0 - 8.3 g/dL -  Total Bilirubin 0.3 - 1.2 mg/dL -  Alkaline Phos 39 - 117 U/L -  AST 0 - 37 U/L -  ALT 0 - 35 U/L -   CBC Latest Ref Rng & Units 08/23/2020  WBC 3.4 - 10.8 x10E3/uL 7.6  Hemoglobin 11.1 - 15.9 g/dL 14.3  Hematocrit 34.0 - 46.6 % 42.9  Platelets 150 - 450 x10E3/uL 222    No images are attached to the encounter.  US BREAST LTD UNI LEFT INC AXILLA  Result Date: 09/08/2020 CLINICAL DATA:  Patient complains of an ulcerated palpable mass in the left breast. The patient states the mass has been present for approximately 1 year. EXAM: DIGITAL DIAGNOSTIC BILATERAL MAMMOGRAM WITH CAD AND TOMO ULTRASOUND LEFT BREAST COMPARISON:  Previous exam(s). ACR Breast Density Category  choose FINDINGS: No suspicious mass or malignant type microcalcifications identified in the right breast. In the upper-outer quadrant of the left breast there is a spiculated mass with malignant type microcalcifications spanning an area of 5.4 cm. Abnormal lymph nodes are seen in the left axilla. Mammographic images were processed with CAD. On physical exam, there is a palpable ulcerated mass in the 1 o'clock region of the left breast 3 cm from the nipple. Targeted ultrasound is performed, showing contiguous hypoechoic interconnected masses in the left breast from 10:00 to 2:00 spanning at least an area of at least 4.8 cm. Sonographic evaluation of the left axilla shows 5 abnormal lymph nodes with cortical thickening. IMPRESSION: Suspicious interconnected masses in the left breast spanning from 10:00 to 2:00 and abnormal axillary adenopathy. RECOMMENDATION: Ultrasound-guided core biopsies of the medial and lateral extent of the masses (10 o'clock and 2 o'clock) in the left breast as well as a left axillary lymph node is recommended. I have discussed the findings and recommendations with the patient. If applicable, a reminder letter will be sent to the patient regarding the next appointment. BI-RADS CATEGORY  5:  Highly suggestive of malignancy. Electronically Signed   By: Lillia Mountain M.D.   On: 09/08/2020 15:48   MM DIAG  BREAST TOMO BILATERAL  Result Date: 09/08/2020 CLINICAL DATA:  Patient complains of an ulcerated palpable mass in the left breast. The patient states the mass has been present for approximately 1 year. EXAM: DIGITAL DIAGNOSTIC BILATERAL MAMMOGRAM WITH CAD AND TOMO ULTRASOUND LEFT BREAST COMPARISON:  Previous exam(s). ACR Breast Density Category  choose FINDINGS: No suspicious mass or malignant type microcalcifications identified in the right breast. In the upper-outer quadrant of the left breast there is a spiculated mass with malignant type microcalcifications spanning an area of 5.4 cm. Abnormal lymph nodes are seen in the left axilla. Mammographic images were processed with CAD. On physical exam, there is a palpable ulcerated mass in the 1 o'clock region of the left breast 3 cm from the nipple. Targeted ultrasound is performed, showing contiguous hypoechoic interconnected masses in the left breast from 10:00 to 2:00 spanning at least an area of at least 4.8 cm. Sonographic evaluation of the left axilla shows 5 abnormal lymph nodes with cortical thickening. IMPRESSION: Suspicious interconnected masses in the left breast spanning from 10:00 to 2:00 and abnormal axillary adenopathy. RECOMMENDATION: Ultrasound-guided core biopsies of the medial and lateral extent of the masses (10 o'clock and 2 o'clock) in the left breast as well as a left axillary lymph node is recommended. I have discussed the findings and recommendations with the patient. If applicable, a reminder letter will be sent to the patient regarding the next appointment. BI-RADS CATEGORY  5: Highly suggestive of malignancy. Electronically Signed   By: Lillia Mountain M.D.   On: 09/08/2020 15:48   MM CLIP PLACEMENT LEFT  Result Date: 09/15/2020 CLINICAL DATA:  Status post LEFT breast mass and lymph node biopsy EXAM: DIAGNOSTIC LEFT MAMMOGRAM  POST ULTRASOUND BIOPSY COMPARISON:  Previous exam(s). FINDINGS: Site 1: Ulcerating LEFT breast mass. Mammographic images were obtained following ultrasound guided biopsy of a irregular LEFT outer breast mass. The Q biopsy marking clip is in expected position at the site of biopsy. Site 2: LEFT axillary lymph node Mammographic images were obtained following ultrasound guided biopsy of a LEFT axillary lymph node without a fatty hila. The [facility] biopsy marking clip is in expected position at the site of biopsy. IMPRESSION: 1. Appropriate positioning of the Q shaped biopsy marking clip at the site of biopsy in the outer breast. 2. Appropriate positioning of the [employer] [facility] shaped biopsy marking clip at the site of biopsy in the axilla. Final Assessment: Post Procedure Mammograms for Marker Placement Electronically Signed   By: Valentino Saxon MD   On: 09/15/2020 14:27   [place] LT BREAST BX W LOC DEV 1ST LESION IMG BX SPEC US GUIDE  Addendum Date: 09/21/2020   ADDENDUM REPORT: 09/21/2020 12:41 ADDENDUM: PATHOLOGY revealed: Site A. LEFT BREAST, 2:00 5 CMFN; ULTRASOUND-GUIDED BIOPSY: - INVASIVE MAMMARY CARCINOMA, NO SPECIAL TYPE. 10 mm in this sample. Grade 2. Ductal carcinoma in situ: Present, with comedonecrosis. Lymphovascular invasion: Not identified. Pathology results are CONCORDANT with imaging findings, per Dr. Valentino Saxon. PATHOLOGY revealed: Site B. LYMPH NODE, LEFT AXILLARY; ULTRASOUND-GUIDED BIOPSY: - METASTATIC MAMMARY CARCINOMA, AS DESCRIBED ABOVE. Comment: Definitive lymph node tissue is not identified in this sample. Pathology results are CONCORDANT with imaging findings, per Dr. Valentino Saxon. Pathology results and recommendations below were discussed with patient by telephone on 09/20/2020. Patient reported biopsy site within normal limits with slight tenderness at the site. Post biopsy care instructions were reviewed, questions were answered and my direct phone number was provided to  patient. Patient was instructed to call [facility]  if any concerns or questions arise related to the biopsy. Recommendation: 1. Surgical and oncological consultation: Request for surgical and oncological consultation relayed to Al Pimple RN and Tanya Nones RN at [facility] by Electa Sniff RN on 09/20/2020. 2. Consider bilateral breast MRI with and without contrast to evaluate extent of breast disease. Pathology results reported by Electa Sniff RN on 09/21/2020. Electronically Signed   By: Valentino Saxon MD   On: 09/21/2020 12:41   Result Date: 09/21/2020 CLINICAL DATA:  Ulcerating LEFT breast mass and abnormal LEFT axillary lymph nodes EXAM: ULTRASOUND GUIDED LEFT BREAST CORE NEEDLE BIOPSY x 2 COMPARISON:  Previous exam(s). PROCEDURE: I met with the patient and we discussed the procedure of ultrasound-guided biopsy, including benefits and alternatives. We discussed the high likelihood of a successful procedure. We discussed the risks of the procedure, including infection, bleeding, tissue injury, clip migration, and inadequate sampling. Informed written consent was given. The usual time-out protocol was performed immediately prior to the procedure. Site 1: Ulcerating LEFT breast mass Lesion quadrant: Upper outer quadrant Using sterile technique and 1% lidocaine and 1% lidocaine with epinephrine as local anesthetic, under direct ultrasound visualization, a 14 gauge spring-loaded device was used to perform biopsy of a mass using a a lateral approach. At the conclusion of the procedure a Q tissue marker clip was deployed into the biopsy cavity. Follow up 2 view mammogram was performed and dictated separately. Site 2: Lymph node with likely associated calcifications in the axilla Lesion quadrant: Upper outer quadrant Using sterile technique and 1% lidocaine and 1% lidocaine with epinephrine as local anesthetic, under direct ultrasound visualization, a 14 gauge spring-loaded  device was used to perform biopsy of a replaced lymph node using an inferolateral approach. At the conclusion of the procedure a HYDROMARK tissue marker clip was deployed into the biopsy cavity. Follow up 2 view mammogram was performed and dictated separately. IMPRESSION: Ultrasound guided biopsy of a LEFT breast mass and abnormal LEFT axillary lymph node with likely associated calcifications. No apparent complications. Electronically Signed: By: Valentino Saxon MD On: 09/15/2020 14:30   [place] LT BREAST BX W LOC DEV EA ADD LESION IMG BX SPEC US GUIDE  Addendum Date: 09/21/2020   ADDENDUM REPORT: 09/21/2020 12:41 ADDENDUM: PATHOLOGY revealed: Site A. LEFT BREAST, 2:00 5 CMFN; ULTRASOUND-GUIDED BIOPSY: - INVASIVE MAMMARY CARCINOMA, NO SPECIAL TYPE. 10 mm in this sample. Grade 2. Ductal carcinoma in situ: Present, with comedonecrosis. Lymphovascular invasion: Not identified. Pathology results are CONCORDANT with imaging findings, per Dr. Valentino Saxon. PATHOLOGY revealed: Site B. LYMPH NODE, LEFT AXILLARY; ULTRASOUND-GUIDED BIOPSY: - METASTATIC MAMMARY CARCINOMA, AS DESCRIBED ABOVE. Comment: Definitive lymph node tissue is not identified in this sample. Pathology results are CONCORDANT with imaging findings, per Dr. Valentino Saxon. Pathology results and recommendations below were discussed with patient by telephone on 09/20/2020. Patient reported biopsy site within normal limits with slight tenderness at the site. Post biopsy care instructions were reviewed, questions were answered and my direct phone number was provided to patient. Patient was instructed to call [facility] if any concerns or questions arise related to the biopsy. Recommendation: 1. Surgical and oncological consultation: Request for surgical and oncological consultation relayed to Al Pimple RN and Tanya Nones RN at [facility] by Electa Sniff RN on 09/20/2020. 2. Consider bilateral breast MRI with and  without contrast to evaluate extent of breast disease. Pathology results reported by Electa Sniff RN on 09/21/2020. Electronically Signed   By: Valentino Saxon  MD   On: 09/21/2020 12:41   Result Date: 09/21/2020 CLINICAL DATA:  Ulcerating LEFT breast mass and abnormal LEFT axillary lymph nodes EXAM: ULTRASOUND GUIDED LEFT BREAST CORE NEEDLE BIOPSY x 2 COMPARISON:  Previous exam(s). PROCEDURE: I met with the patient and we discussed the procedure of ultrasound-guided biopsy, including benefits and alternatives. We discussed the high likelihood of a successful procedure. We discussed the risks of the procedure, including infection, bleeding, tissue injury, clip migration, and inadequate sampling. Informed written consent was given. The usual time-out protocol was performed immediately prior to the procedure. Site 1: Ulcerating LEFT breast mass Lesion quadrant: Upper outer quadrant Using sterile technique and 1% lidocaine and 1% lidocaine with epinephrine as local anesthetic, under direct ultrasound visualization, a 14 gauge spring-loaded device was used to perform biopsy of a mass using a a lateral approach. At the conclusion of the procedure a Q tissue marker clip was deployed into the biopsy cavity. Follow up 2 view mammogram was performed and dictated separately. Site 2: Lymph node with likely associated calcifications in the axilla Lesion quadrant: Upper outer quadrant Using sterile technique and 1% lidocaine and 1% lidocaine with epinephrine as local anesthetic, under direct ultrasound visualization, a 14 gauge spring-loaded device was used to perform biopsy of a replaced lymph node using an inferolateral approach. At the conclusion of the procedure a HYDROMARK tissue marker clip was deployed into the biopsy cavity. Follow up 2 view mammogram was performed and dictated separately. IMPRESSION: Ultrasound guided biopsy of a LEFT breast mass and abnormal LEFT axillary lymph node with likely associated  calcifications. No apparent complications. Electronically Signed: By: Valentino Saxon MD On: 09/15/2020 14:30    Assessment and plan- Patient is a 49 y.o. female with newly diagnosed invasive mammary carcinoma  T4 N1 MX ER/PR positive and HER-2 negative here to discuss further management  I will obtain PET CT scan to complete her staging work-up as well as MRI of her bilateral breasts with and without contrast.  Given that she has locally advanced lung cancer with skin ulceration and palpable left axillary adenopathy, in the absence of metastatic disease I would recommend neoadjuvant chemotherapy with dose dense anthracycline and Cytoxan given IV every 2 weeks for 4 cycles followed by weekly Taxol for 12 cycles.  Discussed risks and benefits of chemotherapy including all but not limited to nausea, vomiting, hair loss, low blood counts, risk of infections and hospitalizations.  Risk of cardiotoxicity associated with anthracycline.  Risk of hair loss and peripheral neuropathy as well as infusion reaction associated with Taxol.  If there is no evidence of metastatic disease treatment will be given with a curative intent.  Discussed with patient that given the skin involvement she would need a left mastectomy and possibly left axillary lymph node dissection but I will defer axillary management to surgery at this time.  I will obtain Ki-67 on her biopsy specimen to see if she would be a candidate for adjuvant abemaciclib.  I will plan to start chemotherapy tentatively in 10 days time.  She will need port placement and chemotherapy teach prior.  We will touch base with [name] to see if he can put the port or we will need to get it done with vascular surgery.  If she gets anthracycline she will also need a baseline echocardiogram prior.  Neoplasm related pain: I will start her on oxycodone 5 mg every 4 hours as needed.  Given family history of possible GYN malignancy in her maternal grandmother I  will also get in touch with genetic counseling to see if she would qualify for genetic testing.  There would also be role for adjuvant radiation treatment following neoadjuvant chemotherapy and surgery.  Given that her tumor is ER/PR positive there would also be role for hormone therapy for 10 years which I will discuss in greater detail following surgery.  Patient and her husband had multiple insightful questions and all of them were answered to their satisfaction.   Thank you for this kind referral and the opportunity to participate in the care of this patient   Visit Diagnosis 1. Malignant neoplasm of upper-outer quadrant of left breast in female, estrogen receptor positive ([place])   2. Goals of care, counseling/discussion     Dr. Randa Evens, MD, MPH [facility] [phone] 09/26/2020 2:47 PM

## 2020-09-27 DIAGNOSIS — Z0289 Encounter for other administrative examinations: Secondary | ICD-10-CM

## 2020-09-28 ENCOUNTER — Telehealth: Payer: Self-pay

## 2020-09-28 ENCOUNTER — Other Ambulatory Visit: Payer: Self-pay

## 2020-09-28 ENCOUNTER — Other Ambulatory Visit (HOSPITAL_COMMUNITY): Payer: Self-pay | Admitting: Surgery

## 2020-09-28 ENCOUNTER — Encounter: Payer: Self-pay | Admitting: *Deleted

## 2020-09-28 DIAGNOSIS — C50912 Malignant neoplasm of unspecified site of left female breast: Secondary | ICD-10-CM

## 2020-09-28 DIAGNOSIS — F17200 Nicotine dependence, unspecified, uncomplicated: Secondary | ICD-10-CM

## 2020-09-28 NOTE — Telephone Encounter (Signed)
mychart message sent to patient

## 2020-09-28 NOTE — Progress Notes (Signed)
Patient called today with questions regarding the prep for her PET scan.  Reviewed prep.  States [facility] just called her to review.  She is very anxious.  Offered support.  States she would like to talk to someone about eating healthy during her chemo.  Discussed with Beckey Rutter.  Referral placed for dietary consult in the cancer center.  She also states she has not heard from [facility].  I have reached out to [employer] and left a message for her to contact the patient.

## 2020-09-29 ENCOUNTER — Telehealth: Payer: Self-pay | Admitting: Licensed Clinical Social Worker

## 2020-09-29 ENCOUNTER — Telehealth: Payer: Self-pay

## 2020-09-29 ENCOUNTER — Other Ambulatory Visit: Payer: Self-pay

## 2020-09-29 ENCOUNTER — Other Ambulatory Visit: Payer: Self-pay | Admitting: Radiology

## 2020-09-29 ENCOUNTER — Ambulatory Visit
Admission: RE | Admit: 2020-09-29 | Discharge: 2020-09-29 | Disposition: A | Discharge status: Home / Self Care | Payer: 59 | Source: Ambulatory Visit | Attending: Oncology | Admitting: Oncology

## 2020-09-29 DIAGNOSIS — K760 Fatty (change of) liver, not elsewhere classified: Secondary | ICD-10-CM | POA: Insufficient documentation

## 2020-09-29 DIAGNOSIS — C50912 Malignant neoplasm of unspecified site of left female breast: Secondary | ICD-10-CM | POA: Diagnosis not present

## 2020-09-29 DIAGNOSIS — K5730 Diverticulosis of large intestine without perforation or abscess without bleeding: Secondary | ICD-10-CM | POA: Diagnosis not present

## 2020-09-29 LAB — GLUCOSE, CAPILLARY: Glucose-Capillary: 95 mg/dL (ref 70–99)

## 2020-09-29 MED ORDER — FLUDEOXYGLUCOSE F - 18 (FDG) INJECTION
6.3000 | Freq: Once | INTRAVENOUS | Status: AC | PRN
  Administered 2020-09-29: 6.48 via INTRAVENOUS

## 2020-09-29 NOTE — Telephone Encounter (Signed)
Called to discuss genetic testing/counseling and to potentially set up appointment, left voicemail for patient to call back.

## 2020-09-29 NOTE — Telephone Encounter (Signed)
mychart message sent to patient

## 2020-09-29 NOTE — Patient Instructions (Signed)
Doxorubicin injection What is this medicine? DOXORUBICIN (dox oh ROO bi sin) is a chemotherapy drug. It is used to treat many kinds of cancer like leukemia, lymphoma, neuroblastoma, sarcoma, and Wilms' tumor. It is also used to treat bladder cancer, breast cancer, lung cancer, ovarian cancer, stomach cancer, and thyroid cancer. This medicine may be used for other purposes; ask your health care provider or pharmacist if you have questions. COMMON BRAND NAME(S): Adriamycin, Adriamycin PFS, Adriamycin RDF, Rubex What should I tell my health care provider before I take this medicine? They need to know if you have any of these conditions:  heart disease  history of low blood counts caused by a medicine  liver disease  recent or ongoing radiation therapy  an unusual or allergic reaction to doxorubicin, other chemotherapy agents, other medicines, foods, dyes, or preservatives  pregnant or trying to get pregnant  breast-feeding How should I use this medicine? This drug is given as an infusion into a vein. It is administered in a hospital or clinic by a specially trained health care professional. If you have pain, swelling, burning or any unusual feeling around the site of your injection, tell your health care professional right away. Talk to your pediatrician regarding the use of this medicine in children. Special care may be needed. Overdosage: If you think you have taken too much of this medicine contact a poison control center or emergency room at once. NOTE: This medicine is only for you. Do not share this medicine with others. What if I miss a dose? It is important not to miss your dose. Call your doctor or health care professional if you are unable to keep an appointment. What may interact with this medicine? This medicine may interact with the following medications:  6-mercaptopurine  paclitaxel  phenytoin  St. John's Wort  trastuzumab  verapamil This list may not describe  all possible interactions. Give your health care provider a list of all the medicines, herbs, non-prescription drugs, or dietary supplements you use. Also tell them if you smoke, drink alcohol, or use illegal drugs. Some items may interact with your medicine. What should I watch for while using this medicine? This drug may make you feel generally unwell. This is not uncommon, as chemotherapy can affect healthy cells as well as cancer cells. Report any side effects. Continue your course of treatment even though you feel ill unless your doctor tells you to stop. There is a maximum amount of this medicine you should receive throughout your life. The amount depends on the medical condition being treated and your overall health. Your doctor will watch how much of this medicine you receive in your lifetime. Tell your doctor if you have taken this medicine before. You may need blood work done while you are taking this medicine. Your urine may turn red for a few days after your dose. This is not blood. If your urine is dark or brown, call your doctor. In some cases, you may be given additional medicines to help with side effects. Follow all directions for their use. Call your doctor or health care professional for advice if you get a fever, chills or sore throat, or other symptoms of a cold or flu. Do not treat yourself. This drug decreases your body's ability to fight infections. Try to avoid being around people who are sick. This medicine may increase your risk to bruise or bleed. Call your doctor or health care professional if you notice any unusual bleeding. Talk to your doctor  about your risk of cancer. You may be more at risk for certain types of cancers if you take this medicine. Do not become pregnant while taking this medicine or for 6 months after stopping it. Women should inform their doctor if they wish to become pregnant or think they might be pregnant. Men should not father a child while taking this  medicine and for 6 months after stopping it. There is a potential for serious side effects to an unborn child. Talk to your health care professional or pharmacist for more information. Do not breast-feed an infant while taking this medicine. This medicine has caused ovarian failure in some women and reduced sperm counts in some men This medicine may interfere with the ability to have a child. Talk with your doctor or health care professional if you are concerned about your fertility. This medicine may cause a decrease in Co-Enzyme Q-10. You should make sure that you get enough Co-Enzyme Q-10 while you are taking this medicine. Discuss the foods you eat and the vitamins you take with your health care professional. What side effects may I notice from receiving this medicine? Side effects that you should report to your doctor or health care professional as soon as possible:  allergic reactions like skin rash, itching or hives, swelling of the face, lips, or tongue  breathing problems  chest pain  fast or irregular heartbeat  low blood counts - this medicine may decrease the number of white blood cells, red blood cells and platelets. You may be at increased risk for infections and bleeding.  pain, redness, or irritation at site where injected  signs of infection - fever or chills, cough, sore throat, pain or difficulty passing urine  signs of decreased platelets or bleeding - bruising, pinpoint red spots on the skin, black, tarry stools, blood in the urine  swelling of the ankles, feet, hands  tiredness  weakness Side effects that usually do not require medical attention (report to your doctor or health care professional if they continue or are bothersome):  diarrhea  hair loss  mouth sores  nail discoloration or damage  nausea  red colored urine  vomiting This list may not describe all possible side effects. Call your doctor for medical advice about side effects. You may report  side effects to FDA at 1-800-FDA-1088. Where should I keep my medicine? This drug is given in a hospital or clinic and will not be stored at home. NOTE: This sheet is a summary. It may not cover all possible information. If you have questions about this medicine, talk to your doctor, pharmacist, or health care provider.  2020 [employer]/Gold Standard (2017-05-23 11:01:26) Cyclophosphamide Injection What is this medicine? CYCLOPHOSPHAMIDE (sye kloe FOSS fa mide) is a chemotherapy drug. It slows the growth of cancer cells. This medicine is used to treat many types of cancer like lymphoma, myeloma, leukemia, breast cancer, and ovarian cancer, to name a few. This medicine may be used for other purposes; ask your health care provider or pharmacist if you have questions. COMMON BRAND NAME(S): Cytoxan, Neosar What should I tell my health care provider before I take this medicine? They need to know if you have any of these conditions:  heart disease  history of irregular heartbeat  infection  kidney disease  liver disease  low blood counts, like white cells, platelets, or red blood cells  on hemodialysis  recent or ongoing radiation therapy  scarring or thickening of the lungs  trouble passing urine  an  unusual or allergic reaction to cyclophosphamide, other medicines, foods, dyes, or preservatives  pregnant or trying to get pregnant  breast-feeding How should I use this medicine? This drug is usually given as an injection into a vein or muscle or by infusion into a vein. It is administered in a hospital or clinic by a specially trained health care professional. Talk to your pediatrician regarding the use of this medicine in children. Special care may be needed. Overdosage: If you think you have taken too much of this medicine contact a poison control center or emergency room at once. NOTE: This medicine is only for you. Do not share this medicine with others. What if I miss a  dose? It is important not to miss your dose. Call your doctor or health care professional if you are unable to keep an appointment. What may interact with this medicine?  amphotericin B  azathioprine  certain antivirals for HIV or hepatitis  certain medicines for blood pressure, heart disease, irregular heart beat  certain medicines that treat or prevent blood clots like warfarin  certain other medicines for cancer  cyclosporine  etanercept  indomethacin  medicines that relax muscles for surgery  medicines to increase blood counts  metronidazole This list may not describe all possible interactions. Give your health care provider a list of all the medicines, herbs, non-prescription drugs, or dietary supplements you use. Also tell them if you smoke, drink alcohol, or use illegal drugs. Some items may interact with your medicine. What should I watch for while using this medicine? Your condition will be monitored carefully while you are receiving this medicine. You may need blood work done while you are taking this medicine. Drink water or other fluids as directed. Urinate often, even at night. Some products may contain alcohol. Ask your health care professional if this medicine contains alcohol. Be sure to tell all health care professionals you are taking this medicine. Certain medicines, like metronidazole and disulfiram, can cause an unpleasant reaction when taken with alcohol. The reaction includes flushing, headache, nausea, vomiting, sweating, and increased thirst. The reaction can last from 30 minutes to several hours. Do not become pregnant while taking this medicine or for 1 year after stopping it. Women should inform their health care professional if they wish to become pregnant or think they might be pregnant. Men should not father a child while taking this medicine and for 4 months after stopping it. There is potential for serious side effects to an unborn child. Talk to your  health care professional for more information. Do not breast-feed an infant while taking this medicine or for 1 week after stopping it. This medicine has caused ovarian failure in some women. This medicine may make it more difficult to get pregnant. Talk to your health care professional if you are concerned about your fertility. This medicine has caused decreased sperm counts in some men. This may make it more difficult to father a child. Talk to your health care professional if you are concerned about your fertility. Call your health care professional for advice if you get a fever, chills, or sore throat, or other symptoms of a cold or flu. Do not treat yourself. This medicine decreases your body's ability to fight infections. Try to avoid being around people who are sick. Avoid taking medicines that contain aspirin, acetaminophen, ibuprofen, naproxen, or ketoprofen unless instructed by your health care professional. These medicines may hide a fever. Talk to your health care professional about your risk of cancer.  You may be more at risk for certain types of cancer if you take this medicine. If you are going to need surgery or other procedure, tell your health care professional that you are using this medicine. Be careful brushing or flossing your teeth or using a toothpick because you may get an infection or bleed more easily. If you have any dental work done, tell your dentist you are receiving this medicine. What side effects may I notice from receiving this medicine? Side effects that you should report to your doctor or health care professional as soon as possible:  allergic reactions like skin rash, itching or hives, swelling of the face, lips, or tongue  breathing problems  nausea, vomiting  signs and symptoms of bleeding such as bloody or black, tarry stools; red or dark brown urine; spitting up blood or brown material that looks like coffee grounds; red spots on the skin; unusual bruising  or bleeding from the eyes, gums, or nose  signs and symptoms of heart failure like fast, irregular heartbeat, sudden weight gain; swelling of the ankles, feet, hands  signs and symptoms of infection like fever; chills; cough; sore throat; pain or trouble passing urine  signs and symptoms of kidney injury like trouble passing urine or change in the amount of urine  signs and symptoms of liver injury like dark yellow or brown urine; general ill feeling or flu-like symptoms; light-colored stools; loss of appetite; nausea; right upper belly pain; unusually weak or tired; yellowing of the eyes or skin Side effects that usually do not require medical attention (report to your doctor or health care professional if they continue or are bothersome):  confusion  decreased hearing  diarrhea  facial flushing  hair loss  headache  loss of appetite  missed menstrual periods  signs and symptoms of low red blood cells or anemia such as unusually weak or tired; feeling faint or lightheaded; falls  skin discoloration This list may not describe all possible side effects. Call your doctor for medical advice about side effects. You may report side effects to FDA at 1-800-FDA-1088. Where should I keep my medicine? This drug is given in a hospital or clinic and will not be stored at home. NOTE: This sheet is a summary. It may not cover all possible information. If you have questions about this medicine, talk to your doctor, pharmacist, or health care provider.  2020 [employer]/Gold Standard (2019-07-14 09:53:29) Paclitaxel injection What is this medicine? PACLITAXEL (PAK li TAX el) is a chemotherapy drug. It targets fast dividing cells, like cancer cells, and causes these cells to die. This medicine is used to treat ovarian cancer, breast cancer, lung cancer, Kaposi's sarcoma, and other cancers. This medicine may be used for other purposes; ask your health care provider or pharmacist if you have  questions. COMMON BRAND NAME(S): Onxol, Taxol What should I tell my health care provider before I take this medicine? They need to know if you have any of these conditions:  history of irregular heartbeat  liver disease  low blood counts, like low white cell, platelet, or red cell counts  lung or breathing disease, like asthma  tingling of the fingers or toes, or other nerve disorder  an unusual or allergic reaction to paclitaxel, alcohol, polyoxyethylated castor oil, other chemotherapy, other medicines, foods, dyes, or preservatives  pregnant or trying to get pregnant  breast-feeding How should I use this medicine? This drug is given as an infusion into a vein. It is administered in a  hospital or clinic by a specially trained health care professional. Talk to your pediatrician regarding the use of this medicine in children. Special care may be needed. Overdosage: If you think you have taken too much of this medicine contact a poison control center or emergency room at once. NOTE: This medicine is only for you. Do not share this medicine with others. What if I miss a dose? It is important not to miss your dose. Call your doctor or health care professional if you are unable to keep an appointment. What may interact with this medicine? Do not take this medicine with any of the following medications:  disulfiram  metronidazole This medicine may also interact with the following medications:  antiviral medicines for hepatitis, HIV or AIDS  certain antibiotics like erythromycin and clarithromycin  certain medicines for fungal infections like ketoconazole and itraconazole  certain medicines for seizures like carbamazepine, phenobarbital, phenytoin  gemfibrozil  nefazodone  rifampin  St. John's wort This list may not describe all possible interactions. Give your health care provider a list of all the medicines, herbs, non-prescription drugs, or dietary supplements you use.  Also tell them if you smoke, drink alcohol, or use illegal drugs. Some items may interact with your medicine. What should I watch for while using this medicine? Your condition will be monitored carefully while you are receiving this medicine. You will need important blood work done while you are taking this medicine. This medicine can cause serious allergic reactions. To reduce your risk you will need to take other medicine(s) before treatment with this medicine. If you experience allergic reactions like skin rash, itching or hives, swelling of the face, lips, or tongue, tell your doctor or health care professional right away. In some cases, you may be given additional medicines to help with side effects. Follow all directions for their use. This drug may make you feel generally unwell. This is not uncommon, as chemotherapy can affect healthy cells as well as cancer cells. Report any side effects. Continue your course of treatment even though you feel ill unless your doctor tells you to stop. Call your doctor or health care professional for advice if you get a fever, chills or sore throat, or other symptoms of a cold or flu. Do not treat yourself. This drug decreases your body's ability to fight infections. Try to avoid being around people who are sick. This medicine may increase your risk to bruise or bleed. Call your doctor or health care professional if you notice any unusual bleeding. Be careful brushing and flossing your teeth or using a toothpick because you may get an infection or bleed more easily. If you have any dental work done, tell your dentist you are receiving this medicine. Avoid taking products that contain aspirin, acetaminophen, ibuprofen, naproxen, or ketoprofen unless instructed by your doctor. These medicines may hide a fever. Do not become pregnant while taking this medicine. Women should inform their doctor if they wish to become pregnant or think they might be pregnant. There is a  potential for serious side effects to an unborn child. Talk to your health care professional or pharmacist for more information. Do not breast-feed an infant while taking this medicine. Men are advised not to father a child while receiving this medicine. This product may contain alcohol. Ask your pharmacist or healthcare provider if this medicine contains alcohol. Be sure to tell all healthcare providers you are taking this medicine. Certain medicines, like metronidazole and disulfiram, can cause an unpleasant reaction when  taken with alcohol. The reaction includes flushing, headache, nausea, vomiting, sweating, and increased thirst. The reaction can last from 30 minutes to several hours. What side effects may I notice from receiving this medicine? Side effects that you should report to your doctor or health care professional as soon as possible:  allergic reactions like skin rash, itching or hives, swelling of the face, lips, or tongue  breathing problems  changes in vision  fast, irregular heartbeat  high or low blood pressure  mouth sores  pain, tingling, numbness in the hands or feet  signs of decreased platelets or bleeding - bruising, pinpoint red spots on the skin, black, tarry stools, blood in the urine  signs of decreased red blood cells - unusually weak or tired, feeling faint or lightheaded, falls  signs of infection - fever or chills, cough, sore throat, pain or difficulty passing urine  signs and symptoms of liver injury like dark yellow or brown urine; general ill feeling or flu-like symptoms; light-colored stools; loss of appetite; nausea; right upper belly pain; unusually weak or tired; yellowing of the eyes or skin  swelling of the ankles, feet, hands  unusually slow heartbeat Side effects that usually do not require medical attention (report to your doctor or health care professional if they continue or are bothersome):  diarrhea  hair loss  loss of  appetite  muscle or joint pain  nausea, vomiting  pain, redness, or irritation at site where injected  tiredness This list may not describe all possible side effects. Call your doctor for medical advice about side effects. You may report side effects to FDA at 1-800-FDA-1088. Where should I keep my medicine? This drug is given in a hospital or clinic and will not be stored at home. NOTE: This sheet is a summary. It may not cover all possible information. If you have questions about this medicine, talk to your doctor, pharmacist, or health care provider.  2020 [employer]/Gold Standard (2017-06-12 13:14:55)

## 2020-09-30 ENCOUNTER — Encounter: Payer: Self-pay | Admitting: Nurse Practitioner

## 2020-09-30 ENCOUNTER — Inpatient Hospital Stay: Payer: 59

## 2020-09-30 ENCOUNTER — Telehealth: Payer: Self-pay | Admitting: Licensed Clinical Social Worker

## 2020-09-30 ENCOUNTER — Other Ambulatory Visit: Payer: Self-pay | Admitting: Oncology

## 2020-09-30 ENCOUNTER — Inpatient Hospital Stay (HOSPITAL_BASED_OUTPATIENT_CLINIC_OR_DEPARTMENT_OTHER): Payer: 59 | Admitting: Nurse Practitioner

## 2020-09-30 DIAGNOSIS — C50912 Malignant neoplasm of unspecified site of left female breast: Secondary | ICD-10-CM | POA: Diagnosis not present

## 2020-09-30 DIAGNOSIS — F419 Anxiety disorder, unspecified: Secondary | ICD-10-CM | POA: Diagnosis not present

## 2020-09-30 DIAGNOSIS — G893 Neoplasm related pain (acute) (chronic): Secondary | ICD-10-CM | POA: Diagnosis not present

## 2020-09-30 DIAGNOSIS — Z170 Estrogen receptor positive status [ER+]: Secondary | ICD-10-CM | POA: Diagnosis not present

## 2020-09-30 DIAGNOSIS — C3490 Malignant neoplasm of unspecified part of unspecified bronchus or lung: Secondary | ICD-10-CM | POA: Diagnosis not present

## 2020-09-30 DIAGNOSIS — Z79899 Other long term (current) drug therapy: Secondary | ICD-10-CM | POA: Diagnosis not present

## 2020-09-30 DIAGNOSIS — Z5111 Encounter for antineoplastic chemotherapy: Secondary | ICD-10-CM | POA: Diagnosis not present

## 2020-09-30 DIAGNOSIS — L98499 Non-pressure chronic ulcer of skin of other sites with unspecified severity: Secondary | ICD-10-CM | POA: Diagnosis not present

## 2020-09-30 DIAGNOSIS — C50412 Malignant neoplasm of upper-outer quadrant of left female breast: Secondary | ICD-10-CM

## 2020-09-30 DIAGNOSIS — R110 Nausea: Secondary | ICD-10-CM | POA: Diagnosis not present

## 2020-09-30 MED ORDER — LIDOCAINE-PRILOCAINE 2.5-2.5 % EX CREA: TOPICAL_CREAM | CUTANEOUS | 3 refills | Status: DC

## 2020-09-30 MED ORDER — ONDANSETRON HCL 8 MG PO TABS: 8.0000 mg | ORAL_TABLET | Freq: Two times a day (BID) | ORAL | 1 refills | Status: DC | PRN

## 2020-09-30 MED ORDER — DEXAMETHASONE 4 MG PO TABS: 8.0000 mg | ORAL_TABLET | Freq: Every day | ORAL | 1 refills | Status: DC

## 2020-09-30 MED ORDER — PROCHLORPERAZINE MALEATE 10 MG PO TABS: 10.0000 mg | ORAL_TABLET | Freq: Four times a day (QID) | ORAL | 1 refills | Status: DC | PRN

## 2020-09-30 MED ORDER — LORAZEPAM 0.5 MG PO TABS: 0.5000 mg | ORAL_TABLET | Freq: Four times a day (QID) | ORAL | 0 refills | Status: DC | PRN

## 2020-09-30 NOTE — Progress Notes (Signed)
[place]  [phone] [phone]  Patient Care Team: Burnard Hawthorne, FNP as PCP - General (Family Medicine)   Name of the patient: Tricia Berry  [id number]  June 07, 1971   Date of visit: 09/30/20  Diagnosis-breast cancer  Chief complaint/Reason for visit- Initial Meeting for [facility], preparing for starting chemotherapy  Heme/Onc history:  Patient noticed left breast lump for about a year gradually enlarging with an area of ulceration on the skin.  She underwent a diagnostic bilateral mammogram on 09/08/2020 which showed hypoechoic interconnected masses in the left breast from 10:00 to 2 o'clock position spanning at least 4.8 cm.  Ultrasound also showed 5 abnormal lymph nodes in the axilla with cortical thickening.  Both the mass and the lymph nodes were biopsied and were consistent with invasive mammary carcinoma, grade 2.  Tumor was ER positive, PR positive, HER-2/neu negative.  Patient met with [name] from [facility] surgery to discuss surgical management.  PET scan was obtained on 09/29/2020.  Given locally advanced breast cancer, Dr. Janese Banks, medical oncology, recommended neoadjuvant chemotherapy with dose dense anthracycline and Cytoxan every 2 weeks for 4 cycles followed by weekly Taxol for 12 cycles.  Treatment given with curative intent.  Oncology History  Malignant neoplasm of upper-outer quadrant of left breast in female, estrogen receptor positive ([place])  09/26/2020 Initial Diagnosis   Malignant neoplasm of upper-outer quadrant of left breast in female, estrogen receptor positive ([place])   10/01/2020 -  Chemotherapy   The patient had dexamethasone (DECADRON) 4 MG tablet, 8 mg, Oral, Daily, 1 of 1 cycle, Start date: 09/30/2020, End date: -- DOXOrubicin (ADRIAMYCIN) chemo injection 92 mg, 60 mg/m2, Intravenous,  Once, 0 of 4 cycles palonosetron (ALOXI) injection 0.25 mg, 0.25 mg,  Intravenous,  Once, 0 of 4 cycles pegfilgrastim (NEULASTA ONPRO KIT) injection 6 mg, 6 mg, Subcutaneous, Once, 0 of 4 cycles cyclophosphamide (CYTOXAN) 920 mg in sodium chloride 0.9 % 250 mL chemo infusion, 600 mg/m2, Intravenous,  Once, 0 of 4 cycles PACLitaxel (TAXOL) 120 mg in sodium chloride 0.9 % 250 mL chemo infusion (</= 94m/m2), 80 mg/m2, Intravenous,  Once, 0 of 12 cycles fosaprepitant (EMEND) 150 mg in sodium chloride 0.9 % 145 mL IVPB, 150 mg, Intravenous,  Once, 0 of 4 cycles  for chemotherapy treatment.      Interval history-  Tricia Berry 49year old female patient, who presents to chemo care clinic today for initial meeting in preparation for starting chemotherapy. I introduced the chemo care clinic and we discussed that the role of the clinic is to assist those who are at an increased risk of emergency room visits and/or complications during the course of chemotherapy treatment. We discussed that the increased risk takes into account factors such as 49, performance status, and co-morbidities. We also discussed that for some, this might include barriers to care such as not having a primary care provider, lack of insurance/transportation, or not being able to afford medications. We discussed that the goal of the program is to help prevent unplanned ER visits and help reduce complications during chemotherapy. We do this by discussing specific risk factors to each individual and identifying ways that we can help improve these risk factors and reduce barriers to care.   ECOG FS:1 - Symptomatic but completely ambulatory  Review of systems- Review of Systems  Constitutional: Negative for chills, fever, malaise/fatigue and weight loss.  HENT: Negative for hearing loss, nosebleeds, sore throat and tinnitus.  Eyes: Negative for blurred vision and double vision.  Respiratory: Negative for cough, hemoptysis, shortness of breath and wheezing.   Cardiovascular: Negative for chest pain,  palpitations and leg swelling.  Gastrointestinal: Negative for abdominal pain, blood in stool, constipation, diarrhea, melena, nausea and vomiting.  Genitourinary: Negative for dysuria and urgency.  Musculoskeletal: Negative for back pain, falls, joint pain and myalgias.  Skin: Negative for itching and rash.  Neurological: Negative for dizziness, tingling, sensory change, loss of consciousness, weakness and headaches.  Endo/Heme/Allergies: Negative for environmental allergies. Does not bruise/bleed easily.  Psychiatric/Behavioral: Negative for depression. The patient is nervous/anxious. The patient does not have insomnia.      Allergies  Allergen Reactions  . Morphine Other (See Comments)    migranes  . Sertraline Hcl     REACTION: Worsened symptoms of IBS  . Sulfamethoxazole Rash  . Sulfonamide Derivatives Rash    Past Medical History:  Diagnosis Date  . Anxiety   . Breast cancer ([place])   . Diverticulitis   . GERD (gastroesophageal reflux disease)   . IBS (irritable bowel syndrome)   . Scoliosis     Past Surgical History:  Procedure Laterality Date  . ABDOMINAL HYSTERECTOMY    . BREAST BIOPSY Left 09/15/2020   [place] bx of mass, path pending, Q marker  . BREAST BIOPSY Left 09/15/2020   [place] bx of LN, hydromarker, path pending    Social History   Socioeconomic History  . Marital status: Divorced    Spouse name: Not on file  . Number of children: Not on file  . Years of education: Not on file  . Highest education level: Not on file  Occupational History  . Not on file  Tobacco Use  . Smoking status: Current Every Day Smoker  . Smokeless tobacco: Never Used  Substance and Sexual Activity  . Alcohol use: Yes  . Drug use: No  . Sexual activity: Not Currently    Birth control/protection: None  Other Topics Concern  . Not on file  Social History Narrative  . Not on file   Social Determinants of Health   Financial Resource Strain: Not on file  Food Insecurity: Not  on file  Transportation Needs: Not on file  Physical Activity: Not on file  Stress: Not on file  Social Connections: Not on file  Intimate Partner Violence: Not on file    Family History  Problem Relation Age of Onset  . Hypertension Mother   . Osteoarthritis Mother   . Diverticulitis Mother   . Heart failure Father   . Hypertension Father   . Gout Father   . Diverticulitis Brother   . Lung cancer Maternal Uncle   . Lung cancer Maternal Uncle    Immunization History  Administered Date(s) Administered  . Influenza Whole 08/30/2009, 09/14/2010  . Influenza-Unspecified 07/11/2020  . Td 07/27/2009  . Tdap 08/23/2020     Current Outpatient Medications:  .  cyclobenzaprine (FLEXERIL) 10 MG tablet, Take 1 tablet (10 mg total) by mouth 3 (three) times daily as needed for muscle spasms. (Patient not taking: Reported on 09/24/2020), Disp: 30 tablet, Rfl: 0 .  dexamethasone (DECADRON) 4 MG tablet, Take 2 tablets (8 mg total) by mouth daily. Take daily for 3 days after chemo. Take with food., Disp: 30 tablet, Rfl: 1 .  hydrOXYzine (VISTARIL) 50 MG capsule, Take 1 capsule (50 mg total) by mouth 4 (four) times daily as needed for anxiety., Disp: 30 capsule, Rfl: 0 .  Lactobacillus (PROBIOTIC ACIDOPHILUS  PO), Take 1 capsule by mouth daily., Disp: , Rfl:  .  lidocaine-prilocaine (EMLA) cream, Apply to affected area once, Disp: 30 g, Rfl: 3 .  LORazepam (ATIVAN) 0.5 MG tablet, Take 1 tablet (0.5 mg total) by mouth every 6 (six) hours as needed (Nausea or vomiting)., Disp: 30 tablet, Rfl: 0 .  Multiple Vitamins-Minerals (MULTIVITAL PO), Take 1 Dose by mouth daily., Disp: , Rfl:  .  ondansetron (ZOFRAN) 8 MG tablet, Take 1 tablet (8 mg total) by mouth 2 (two) times daily as needed. Start on the third day after chemotherapy., Disp: 30 tablet, Rfl: 1 .  oxyCODONE (OXY IR/ROXICODONE) 5 MG immediate release tablet, Take 1 tablet (5 mg total) by mouth every 6 (six) hours as needed for severe pain.,  Disp: 120 tablet, Rfl: 0 .  prochlorperazine (COMPAZINE) 10 MG tablet, Take 1 tablet (10 mg total) by mouth every 6 (six) hours as needed (Nausea or vomiting)., Disp: 30 tablet, Rfl: 1  Physical exam: There were no vitals filed for this visit. Physical Exam Constitutional:      General: She is not in acute distress.    Comments: Accompanied by husband  Neurological:     Mental Status: She is alert.  Psychiatric:        Mood and Affect: Mood is anxious.      CMP Latest Ref Rng & Units 05/09/2012  Glucose 70 - 99 mg/dL 94  BUN 6 - 23 mg/dL <3(L)  Creatinine 0.50 - 1.10 mg/dL 0.63  Sodium 135 - 145 mEq/L 142  Potassium 3.5 - 5.1 mEq/L 3.9  Chloride 96 - 112 mEq/L 103  CO2 19 - 32 mEq/L 27  Calcium 8.4 - 10.5 mg/dL 9.6  Total Protein 6.0 - 8.3 g/dL -  Total Bilirubin 0.3 - 1.2 mg/dL -  Alkaline Phos 39 - 117 U/L -  AST 0 - 37 U/L -  ALT 0 - 35 U/L -   CBC Latest Ref Rng & Units 08/23/2020  WBC 3.4 - 10.8 x10E3/uL 7.6  Hemoglobin 11.1 - 15.9 g/dL 14.3  Hematocrit 34.0 - 46.6 % 42.9  Platelets 150 - 450 x10E3/uL 222    No images are attached to the encounter.  NM PET Image Initial (PI) Skull Base To Thigh  Result Date: 09/29/2020 CLINICAL DATA:  Initial treatment strategy for LEFT breast carcinoma. COVID research shot RIGHT arm. EXAM: NUCLEAR MEDICINE PET SKULL BASE TO THIGH TECHNIQUE: 6.5 mCi F-18 FDG was injected intravenously. Full-ring PET imaging was performed from the skull base to thigh after the radiotracer. CT data was obtained and used for attenuation correction and anatomic localization. Fasting blood glucose: 95 mg/dl COMPARISON:  Breast MRI 09/15/2020 FINDINGS: Mediastinal blood pool activity: SUV max 2.1 Liver activity: SUV max 2.7 NECK: No hypermetabolic lymph nodes in the neck. Incidental CT findings: none CHEST: Intense metabolic activity in the posterior lateral aspect of the LEFT breast near the skin surface with SUV max equal 3.9 (image 148). No hypermetabolic  LEFT axillary lymph nodes. No hypermetabolic internal mammary lymph nodes or mediastinal lymph nodes. Mildly metabolic RIGHT axial lymph nodes have normal morphology there are favored related to recent COVID vaccination in RIGHT arm (SUV max equal 1.8 on image 189). Vaccination inflammation noted in the adjacent musculature. Incidental CT findings: none ABDOMEN/PELVIS: No abnormal hypermetabolic activity within the liver, pancreas, adrenal glands, or spleen. No hypermetabolic lymph nodes in the abdomen or pelvis. Incidental CT findings: Low-attenuation within liver suggests mild hepatic steatosis. Multiple diverticula of the sigmoid  colon without acute inflammation. SKELETON: No focal hypermetabolic activity to suggest skeletal metastasis. Incidental CT findings: none IMPRESSION: 1. Hypermetabolic lesion in the lateral LEFT breast consistent primary breast carcinoma. 2. No evidence of local LEFT axillary nodal metastasis. 3. No evidence of central thoracic nodal metastasis. 4. No evidence distant metastatic disease. 5. Mild metabolic activity of RIGHT axial lymph nodes related to COVID vaccination. 6. Incidental findings of mild hepatic steatosis and diverticulosis. Electronically Signed   By: Suzy Bouchard M.D.   On: 09/29/2020 17:18   US BREAST LTD UNI LEFT INC AXILLA  Result Date: 09/08/2020 CLINICAL DATA:  Patient complains of an ulcerated palpable mass in the left breast. The patient states the mass has been present for approximately 1 year. EXAM: DIGITAL DIAGNOSTIC BILATERAL MAMMOGRAM WITH CAD AND TOMO ULTRASOUND LEFT BREAST COMPARISON:  Previous exam(s). ACR Breast Density Category  choose FINDINGS: No suspicious mass or malignant type microcalcifications identified in the right breast. In the upper-outer quadrant of the left breast there is a spiculated mass with malignant type microcalcifications spanning an area of 5.4 cm. Abnormal lymph nodes are seen in the left axilla. Mammographic images were  processed with CAD. On physical exam, there is a palpable ulcerated mass in the 1 o'clock region of the left breast 3 cm from the nipple. Targeted ultrasound is performed, showing contiguous hypoechoic interconnected masses in the left breast from 10:00 to 2:00 spanning at least an area of at least 4.8 cm. Sonographic evaluation of the left axilla shows 5 abnormal lymph nodes with cortical thickening. IMPRESSION: Suspicious interconnected masses in the left breast spanning from 10:00 to 2:00 and abnormal axillary adenopathy. RECOMMENDATION: Ultrasound-guided core biopsies of the medial and lateral extent of the masses (10 o'clock and 2 o'clock) in the left breast as well as a left axillary lymph node is recommended. I have discussed the findings and recommendations with the patient. If applicable, a reminder letter will be sent to the patient regarding the next appointment. BI-RADS CATEGORY  5: Highly suggestive of malignancy. Electronically Signed   By: Lillia Mountain M.D.   On: 09/08/2020 15:48   MM DIAG BREAST TOMO BILATERAL  Result Date: 09/08/2020 CLINICAL DATA:  Patient complains of an ulcerated palpable mass in the left breast. The patient states the mass has been present for approximately 1 year. EXAM: DIGITAL DIAGNOSTIC BILATERAL MAMMOGRAM WITH CAD AND TOMO ULTRASOUND LEFT BREAST COMPARISON:  Previous exam(s). ACR Breast Density Category  choose FINDINGS: No suspicious mass or malignant type microcalcifications identified in the right breast. In the upper-outer quadrant of the left breast there is a spiculated mass with malignant type microcalcifications spanning an area of 5.4 cm. Abnormal lymph nodes are seen in the left axilla. Mammographic images were processed with CAD. On physical exam, there is a palpable ulcerated mass in the 1 o'clock region of the left breast 3 cm from the nipple. Targeted ultrasound is performed, showing contiguous hypoechoic interconnected masses in the left breast from 10:00  to 2:00 spanning at least an area of at least 4.8 cm. Sonographic evaluation of the left axilla shows 5 abnormal lymph nodes with cortical thickening. IMPRESSION: Suspicious interconnected masses in the left breast spanning from 10:00 to 2:00 and abnormal axillary adenopathy. RECOMMENDATION: Ultrasound-guided core biopsies of the medial and lateral extent of the masses (10 o'clock and 2 o'clock) in the left breast as well as a left axillary lymph node is recommended. I have discussed the findings and recommendations with the patient. If applicable, a reminder  letter will be sent to the patient regarding the next appointment. BI-RADS CATEGORY  5: Highly suggestive of malignancy. Electronically Signed   By: Lillia Mountain M.D.   On: 09/08/2020 15:48   MM CLIP PLACEMENT LEFT  Result Date: 09/15/2020 CLINICAL DATA:  Status post LEFT breast mass and lymph node biopsy EXAM: DIAGNOSTIC LEFT MAMMOGRAM POST ULTRASOUND BIOPSY COMPARISON:  Previous exam(s). FINDINGS: Site 1: Ulcerating LEFT breast mass. Mammographic images were obtained following ultrasound guided biopsy of a irregular LEFT outer breast mass. The Q biopsy marking clip is in expected position at the site of biopsy. Site 2: LEFT axillary lymph node Mammographic images were obtained following ultrasound guided biopsy of a LEFT axillary lymph node without a fatty hila. [facility] biopsy marking clip is in expected position at the site of biopsy. IMPRESSION: 1. Appropriate positioning of the Q shaped biopsy marking clip at the site of biopsy in the outer breast. 2. Appropriate positioning of the [facility] shaped biopsy marking clip at the site of biopsy in the axilla. Final Assessment: Post Procedure Mammograms for Marker Placement Electronically Signed   By: Valentino Saxon MD   On: 09/15/2020 14:27   [place] LT BREAST BX W LOC DEV 1ST LESION IMG BX SPEC US GUIDE  Addendum Date: 09/21/2020   ADDENDUM REPORT: 09/21/2020 12:41 ADDENDUM: PATHOLOGY revealed:  Site A. LEFT BREAST, 2:00 5 CMFN; ULTRASOUND-GUIDED BIOPSY: - INVASIVE MAMMARY CARCINOMA, NO SPECIAL TYPE. 10 mm in this sample. Grade 2. Ductal carcinoma in situ: Present, with comedonecrosis. Lymphovascular invasion: Not identified. Pathology results are CONCORDANT with imaging findings, per Dr. Valentino Saxon. PATHOLOGY revealed: Site B. LYMPH NODE, LEFT AXILLARY; ULTRASOUND-GUIDED BIOPSY: - METASTATIC MAMMARY CARCINOMA, AS DESCRIBED ABOVE. Comment: Definitive lymph node tissue is not identified in this sample. Pathology results are CONCORDANT with imaging findings, per Dr. Valentino Saxon. Pathology results and recommendations below were discussed with patient by telephone on 09/20/2020. Patient reported biopsy site within normal limits with slight tenderness at the site. Post biopsy care instructions were reviewed, questions were answered and my direct phone number was provided to patient. Patient was instructed to call [facility] if any concerns or questions arise related to the biopsy. Recommendation: 1. Surgical and oncological consultation: Request for surgical and oncological consultation relayed to Al Pimple RN and Tanya Nones RN at [facility] by Electa Sniff RN on 09/20/2020. 2. Consider bilateral breast MRI with and without contrast to evaluate extent of breast disease. Pathology results reported by Electa Sniff RN on 09/21/2020. Electronically Signed   By: Valentino Saxon MD   On: 09/21/2020 12:41   Result Date: 09/21/2020 CLINICAL DATA:  Ulcerating LEFT breast mass and abnormal LEFT axillary lymph nodes EXAM: ULTRASOUND GUIDED LEFT BREAST CORE NEEDLE BIOPSY x 2 COMPARISON:  Previous exam(s). PROCEDURE: I met with the patient and we discussed the procedure of ultrasound-guided biopsy, including benefits and alternatives. We discussed the high likelihood of a successful procedure. We discussed the risks of the procedure, including infection, bleeding, tissue  injury, clip migration, and inadequate sampling. Informed written consent was given. The usual time-out protocol was performed immediately prior to the procedure. Site 1: Ulcerating LEFT breast mass Lesion quadrant: Upper outer quadrant Using sterile technique and 1% lidocaine and 1% lidocaine with epinephrine as local anesthetic, under direct ultrasound visualization, a 14 gauge spring-loaded device was used to perform biopsy of a mass using a a lateral approach. At the conclusion of the procedure a Q tissue marker clip was deployed into  the biopsy cavity. Follow up 2 view mammogram was performed and dictated separately. Site 2: Lymph node with likely associated calcifications in the axilla Lesion quadrant: Upper outer quadrant Using sterile technique and 1% lidocaine and 1% lidocaine with epinephrine as local anesthetic, under direct ultrasound visualization, a 14 gauge spring-loaded device was used to perform biopsy of a replaced lymph node using an inferolateral approach. At the conclusion of the procedure a HYDROMARK tissue marker clip was deployed into the biopsy cavity. Follow up 2 view mammogram was performed and dictated separately. IMPRESSION: Ultrasound guided biopsy of a LEFT breast mass and abnormal LEFT axillary lymph node with likely associated calcifications. No apparent complications. Electronically Signed: By: Valentino Saxon MD On: 09/15/2020 14:30   [place] LT BREAST BX W LOC DEV EA ADD LESION IMG BX SPEC US GUIDE  Addendum Date: 09/21/2020   ADDENDUM REPORT: 09/21/2020 12:41 ADDENDUM: PATHOLOGY revealed: Site A. LEFT BREAST, 2:00 5 CMFN; ULTRASOUND-GUIDED BIOPSY: - INVASIVE MAMMARY CARCINOMA, NO SPECIAL TYPE. 10 mm in this sample. Grade 2. Ductal carcinoma in situ: Present, with comedonecrosis. Lymphovascular invasion: Not identified. Pathology results are CONCORDANT with imaging findings, per Dr. Valentino Saxon. PATHOLOGY revealed: Site B. LYMPH NODE, LEFT AXILLARY; ULTRASOUND-GUIDED  BIOPSY: - METASTATIC MAMMARY CARCINOMA, AS DESCRIBED ABOVE. Comment: Definitive lymph node tissue is not identified in this sample. Pathology results are CONCORDANT with imaging findings, per Dr. Valentino Saxon. Pathology results and recommendations below were discussed with patient by telephone on 09/20/2020. Patient reported biopsy site within normal limits with slight tenderness at the site. Post biopsy care instructions were reviewed, questions were answered and my direct phone number was provided to patient. Patient was instructed to call [facility] if any concerns or questions arise related to the biopsy. Recommendation: 1. Surgical and oncological consultation: Request for surgical and oncological consultation relayed to Al Pimple RN and Tanya Nones RN at [facility] by Electa Sniff RN on 09/20/2020. 2. Consider bilateral breast MRI with and without contrast to evaluate extent of breast disease. Pathology results reported by Electa Sniff RN on 09/21/2020. Electronically Signed   By: Valentino Saxon MD   On: 09/21/2020 12:41   Result Date: 09/21/2020 CLINICAL DATA:  Ulcerating LEFT breast mass and abnormal LEFT axillary lymph nodes EXAM: ULTRASOUND GUIDED LEFT BREAST CORE NEEDLE BIOPSY x 2 COMPARISON:  Previous exam(s). PROCEDURE: I met with the patient and we discussed the procedure of ultrasound-guided biopsy, including benefits and alternatives. We discussed the high likelihood of a successful procedure. We discussed the risks of the procedure, including infection, bleeding, tissue injury, clip migration, and inadequate sampling. Informed written consent was given. The usual time-out protocol was performed immediately prior to the procedure. Site 1: Ulcerating LEFT breast mass Lesion quadrant: Upper outer quadrant Using sterile technique and 1% lidocaine and 1% lidocaine with epinephrine as local anesthetic, under direct ultrasound visualization, a 14 gauge  spring-loaded device was used to perform biopsy of a mass using a a lateral approach. At the conclusion of the procedure a Q tissue marker clip was deployed into the biopsy cavity. Follow up 2 view mammogram was performed and dictated separately. Site 2: Lymph node with likely associated calcifications in the axilla Lesion quadrant: Upper outer quadrant Using sterile technique and 1% lidocaine and 1% lidocaine with epinephrine as local anesthetic, under direct ultrasound visualization, a 14 gauge spring-loaded device was used to perform biopsy of a replaced lymph node using an inferolateral approach. At the conclusion of the procedure a [facility]  tissue marker clip was deployed into the biopsy cavity. Follow up 2 view mammogram was performed and dictated separately. IMPRESSION: Ultrasound guided biopsy of a LEFT breast mass and abnormal LEFT axillary lymph node with likely associated calcifications. No apparent complications. Electronically Signed: By: Valentino Saxon MD On: 09/15/2020 14:30     Assessment and plan- Patient is a 49 y.o. female who presents to [facility] for initial meeting in preparation for starting chemotherapy for the treatment of breast cancer diagnosed 09/15/2020.   1. Breast cancer-locally advanced.  Staging pending but pet briefly reviewed today and did not show any evidence of metastatic disease.  Plan for neoadjuvant dose dense Adriamycin and Cytoxan every 2 weeks for four cycles followed by weekly Taxol.  Again reviewed risks and benefits of chemotherapy.  2. Chemo Care Clinic/High Risk for ER/Hospitalization during chemotherapy- We discussed the role of the chemo care clinic and identified patient specific risk factors. I discussed that patient was identified as low risk primarily based on history of depression.  3. Social Determinants of Health- we discussed that social determinants of health may have significant impacts on health and outcomes for cancer patients.   Today we discussed specific social determinants of performance status, alcohol use, depression, financial needs, food insecurity, housing, interpersonal violence, social connections, stress, tobacco use, and transportation.  We extensively discussed programs and services available through the cancer center including but not limited to outpatient occupational therapy, care program, counseling services, palliative medicine, symptom management clinic, social work, Public librarian, Building surveyor, support groups, nurse navigation, dietitian, smoking cessation, and transportation assistance.  We also discussed the role of the [facility] at [facility] for acute issues and methods of contacting clinic/provider. She denies needing specific assistance at this time and She will be followed by Tanya Nones, RN (Nurse Navigator).    Visit Diagnosis 1. Malignant 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, estrogen receptor positive, unspecified site of breast [facility])    Patient expressed understanding and was in agreement with this plan. She also understands that She can call clinic at any time with any questions, concerns, or complaints.   A total of (30) minutes of face-to-face time was spent with this patient with greater than 50% of that time in counseling and care-coordination.  Beckey Rutter, DNP, AGNP-C [facility]: Dr. Janese Banks

## 2020-09-30 NOTE — Telephone Encounter (Signed)
Received call back from Tricia Berry. We discussed that she currently does not meet NCCN criteria for genetic testing, but other criteria suggest testing all patients with breast cancer so we could do the testing but insurance may not cover it. We also discussed that insurance would cover if her grandmother did have ovarian cancer. She plans to discuss this with her family and will reach back out to me at the beginning of the year.

## 2020-09-30 NOTE — Progress Notes (Signed)
START ON PATHWAY REGIMEN - Breast     Cycles 1 through 4: A cycle is every 14 days:     Doxorubicin      Cyclophosphamide      Pegfilgrastim-xxxx    Cycles 5 through 16: A cycle is every 7 days:     Paclitaxel   **Always confirm dose/schedule in your pharmacy ordering system**  Patient Characteristics: Preoperative or Nonsurgical Candidate (Clinical Staging), Neoadjuvant Therapy followed by Surgery, Invasive Disease, Chemotherapy, HER2 Negative/Unknown/Equivocal, ER Positive Therapeutic Status: Preoperative or Nonsurgical Candidate (Clinical Staging) AJCC M Category: cM0 AJCC Grade: G2 Breast Surgical Plan: Neoadjuvant Therapy followed by Surgery ER Status: Positive (+) AJCC 8 Stage Grouping: IIIB HER2 Status: Negative (-) AJCC T Category: cT4a AJCC N Category: cN1 PR Status: Positive (+) Intent of Therapy: Curative Intent, Discussed with Patient

## 2020-10-01 ENCOUNTER — Other Ambulatory Visit: Payer: Self-pay

## 2020-10-01 ENCOUNTER — Ambulatory Visit (HOSPITAL_COMMUNITY)
Admission: RE | Admit: 2020-10-01 | Discharge: 2020-10-01 | Disposition: A | Discharge status: Home / Self Care | Payer: 59 | Source: Ambulatory Visit | Attending: Surgery | Admitting: Surgery

## 2020-10-01 ENCOUNTER — Encounter (HOSPITAL_COMMUNITY): Payer: Self-pay

## 2020-10-01 ENCOUNTER — Ambulatory Visit (HOSPITAL_COMMUNITY)
Admission: RE | Admit: 2020-10-01 | Discharge: 2020-10-01 | Disposition: A | Discharge status: Home / Self Care | Payer: 59 | Source: Ambulatory Visit

## 2020-10-01 DIAGNOSIS — F17200 Nicotine dependence, unspecified, uncomplicated: Secondary | ICD-10-CM | POA: Diagnosis not present

## 2020-10-01 DIAGNOSIS — Z885 Allergy status to narcotic agent status: Secondary | ICD-10-CM | POA: Diagnosis not present

## 2020-10-01 DIAGNOSIS — M419 Scoliosis, unspecified: Secondary | ICD-10-CM | POA: Diagnosis not present

## 2020-10-01 DIAGNOSIS — K589 Irritable bowel syndrome without diarrhea: Secondary | ICD-10-CM | POA: Diagnosis not present

## 2020-10-01 DIAGNOSIS — K219 Gastro-esophageal reflux disease without esophagitis: Secondary | ICD-10-CM | POA: Insufficient documentation

## 2020-10-01 DIAGNOSIS — F419 Anxiety disorder, unspecified: Secondary | ICD-10-CM | POA: Insufficient documentation

## 2020-10-01 DIAGNOSIS — C50912 Malignant neoplasm of unspecified site of left female breast: Secondary | ICD-10-CM | POA: Insufficient documentation

## 2020-10-01 DIAGNOSIS — Z452 Encounter for adjustment and management of vascular access device: Secondary | ICD-10-CM | POA: Diagnosis not present

## 2020-10-01 DIAGNOSIS — Z882 Allergy status to sulfonamides status: Secondary | ICD-10-CM | POA: Diagnosis not present

## 2020-10-01 HISTORY — PX: IR IMAGING GUIDED PORT INSERTION: IMG5740

## 2020-10-01 LAB — CBC WITH DIFFERENTIAL/PLATELET
Abs Immature Granulocytes: 0.02 10*3/uL (ref 0.00–0.07)
Basophils Absolute: 0 10*3/uL (ref 0.0–0.1)
Basophils Relative: 1 %
Eosinophils Absolute: 0.1 10*3/uL (ref 0.0–0.5)
Eosinophils Relative: 1 %
HCT: 41.5 % (ref 36.0–46.0)
Hemoglobin: 13.7 g/dL (ref 12.0–15.0)
Immature Granulocytes: 0 %
Lymphocytes Relative: 49 %
Lymphs Abs: 3.7 10*3/uL (ref 0.7–4.0)
MCH: 30.6 pg (ref 26.0–34.0)
MCHC: 33 g/dL (ref 30.0–36.0)
MCV: 92.6 fL (ref 80.0–100.0)
Monocytes Absolute: 0.5 10*3/uL (ref 0.1–1.0)
Monocytes Relative: 6 %
Neutro Abs: 3.3 10*3/uL (ref 1.7–7.7)
Neutrophils Relative %: 43 %
Platelets: 241 10*3/uL (ref 150–400)
RBC: 4.48 MIL/uL (ref 3.87–5.11)
RDW: 12.7 % (ref 11.5–15.5)
WBC: 7.6 10*3/uL (ref 4.0–10.5)
nRBC: 0 % (ref 0.0–0.2)

## 2020-10-01 LAB — BASIC METABOLIC PANEL
Anion gap: 11 (ref 5–15)
BUN: 12 mg/dL (ref 6–20)
CO2: 24 mmol/L (ref 22–32)
Calcium: 9.8 mg/dL (ref 8.9–10.3)
Chloride: 104 mmol/L (ref 98–111)
Creatinine, Ser: 0.8 mg/dL (ref 0.44–1.00)
GFR, Estimated: >60 mL/min (ref >60)
Glucose, Bld: 98 mg/dL (ref 70–99)
Potassium: 4.2 mmol/L (ref 3.5–5.1)
Sodium: 139 mmol/L (ref 135–145)

## 2020-10-01 LAB — PROTIME-INR
INR: 1.1 (ref 0.8–1.2)
Prothrombin Time: 13.6 seconds (ref 11.4–15.2)

## 2020-10-01 MED ORDER — CEFAZOLIN SODIUM-DEXTROSE 2-4 GM/100ML-% IV SOLN
INTRAVENOUS | Status: AC
  Administered 2020-10-01: 2 g via INTRAVENOUS
  Filled 2020-10-01: qty 100

## 2020-10-01 MED ORDER — LIDOCAINE HCL 1 % IJ SOLN
INTRAMUSCULAR | Status: AC
  Filled 2020-10-01: qty 20

## 2020-10-01 MED ORDER — MIDAZOLAM HCL 2 MG/2ML IJ SOLN
INTRAMUSCULAR | Status: AC
  Filled 2020-10-01: qty 4

## 2020-10-01 MED ORDER — MIDAZOLAM HCL 2 MG/2ML IJ SOLN
INTRAMUSCULAR | Status: AC | PRN
Start: 2020-10-01 — End: 2020-10-01
  Administered 2020-10-01 (×3): 1 mg via INTRAVENOUS

## 2020-10-01 MED ORDER — SODIUM CHLORIDE 0.9 % IV SOLN: INTRAVENOUS | Status: DC

## 2020-10-01 MED ORDER — FENTANYL CITRATE (PF) 100 MCG/2ML IJ SOLN
INTRAMUSCULAR | Status: AC
  Filled 2020-10-01: qty 2

## 2020-10-01 MED ORDER — HEPARIN SOD (PORK) LOCK FLUSH 100 UNIT/ML IV SOLN
INTRAVENOUS | Status: AC
  Filled 2020-10-01: qty 5

## 2020-10-01 MED ORDER — HEPARIN SOD (PORK) LOCK FLUSH 100 UNIT/ML IV SOLN
INTRAVENOUS | Status: AC | PRN
  Administered 2020-10-01: 500 [IU] via INTRAVENOUS

## 2020-10-01 MED ORDER — CEFAZOLIN SODIUM-DEXTROSE 2-4 GM/100ML-% IV SOLN: 2.0000 g | INTRAVENOUS | Status: AC

## 2020-10-01 MED ORDER — LIDOCAINE HCL (PF) 1 % IJ SOLN
INTRAMUSCULAR | Status: AC | PRN
  Administered 2020-10-01: 10 mL

## 2020-10-01 MED ORDER — FENTANYL CITRATE (PF) 100 MCG/2ML IJ SOLN
INTRAMUSCULAR | Status: AC | PRN
  Administered 2020-10-01 (×2): 50 ug via INTRAVENOUS

## 2020-10-01 NOTE — Discharge Instructions (Addendum)
Call interventional radiology clinic [phone] for problems and concerns   No EMLA cream for 2 weeks following port placement  May remove dressing and shower in 24 hours  Implanted Port Insertion, Care After This sheet gives you information about how to care for yourself after your procedure. Your health care provider may also give you more specific instructions. If you have problems or questions, contact your health care provider. What can I expect after the procedure? After the procedure, it is common to have:  Discomfort at the port insertion site.  Bruising on the skin over the port. This should improve over 3-4 days. Follow these instructions at home: [facility] care  After your port is placed, you will get a manufacturer's information card. The card has information about your port. Keep this card with you at all times.  Take care of the port as told by your health care provider. Ask your health care provider if you or a family member can get training for taking care of the port at home. A home health care nurse may also take care of the port.  Make sure to remember what type of port you have. Incision care      Follow instructions from your health care provider about how to take care of your port insertion site. Make sure you: ? Wash your hands with soap and water before and after you change your bandage (dressing). If soap and water are not available, use hand sanitizer. ? Change your dressing as told by your health care provider. ? Leave stitches (sutures), skin glue, or adhesive strips in place. These skin closures may need to stay in place for 2 weeks or longer. If adhesive strip edges start to loosen and curl up, you may trim the loose edges. Do not remove adhesive strips completely unless your health care provider tells you to do that.  Check your port insertion site every day for signs of infection. Check for: ? Redness, swelling, or pain. ? Fluid or  blood. ? Warmth. ? Pus or a bad smell. Activity  Return to your normal activities as told by your health care provider. Ask your health care provider what activities are safe for you.  Do not lift anything that is heavier than 10 lb (4.5 kg), or the limit that you are told, until your health care provider says that it is safe. General instructions  Take over-the-counter and prescription medicines only as told by your health care provider.  Do not take baths, swim, or use a hot tub until your health care provider approves. Ask your health care provider if you may take showers. You may only be allowed to take sponge baths.  Do not drive for 24 hours if you were given a sedative during your procedure.  Wear a medical alert bracelet in case of an emergency. This will tell any health care providers that you have a port.  Keep all follow-up visits as told by your health care provider. This is important. Contact a health care provider if:  You cannot flush your port with saline as directed, or you cannot draw blood from the port.  You have a fever or chills.  You have redness, swelling, or pain around your port insertion site.  You have fluid or blood coming from your port insertion site.  Your port insertion site feels warm to the touch.  You have pus or a bad smell coming from the port insertion site. Get help right away if:  You  have chest pain or shortness of breath.  You have bleeding from your port that you cannot control. Summary  Take care of the port as told by your health care provider. Keep the manufacturer's information card with you at all times.  Change your dressing as told by your health care provider.  Contact a health care provider if you have a fever or chills or if you have redness, swelling, or pain around your port insertion site.  Keep all follow-up visits as told by your health care provider. This information is not intended to replace advice given to  you by your health care provider. Make sure you discuss any questions you have with your health care provider. Document Revised: 05/07/2018 Document Reviewed: 05/07/2018 Elsevier Patient Education  [place].    Moderate Conscious Sedation, Adult, Care After These instructions provide you with information about caring for yourself after your procedure. Your health care provider may also give you more specific instructions. Your treatment has been planned according to current medical practices, but problems sometimes occur. Call your health care provider if you have any problems or questions after your procedure. What can I expect after the procedure? After your procedure, it is common:  To feel sleepy for several hours.  To feel clumsy and have poor balance for several hours.  To have poor judgment for several hours.  To vomit if you eat too soon. Follow these instructions at home: For at least 24 hours after the procedure:   Do not: ? Participate in activities where you could fall or become injured. ? Drive. ? Use heavy machinery. ? Drink alcohol. ? Take sleeping pills or medicines that cause drowsiness. ? Make important decisions or sign legal documents. ? Take care of children on your own.  Rest. Eating and drinking  Follow the diet recommended by your health care provider.  If you vomit: ? Drink water, juice, or soup when you can drink without vomiting. ? Make sure you have little or no nausea before eating solid foods. General instructions  Have a responsible adult stay with you until you are awake and alert.  Take over-the-counter and prescription medicines only as told by your health care provider.  If you smoke, do not smoke without supervision.  Keep all follow-up visits as told by your health care provider. This is important. Contact a health care provider if:  You keep feeling nauseous or you keep vomiting.  You feel light-headed.  You develop  a rash.  You have a fever. Get help right away if:  You have trouble breathing. This information is not intended to replace advice given to you by your health care provider. Make sure you discuss any questions you have with your health care provider. Document Revised: 09/21/2017 Document Reviewed: 01/29/2016 Elsevier Patient Education  2020 [employer].

## 2020-10-01 NOTE — Procedures (Signed)
Interventional Radiology Procedure Note  Procedure: Single Lumen Power Port Placement    Access:  Right IJ vein.  Findings: Catheter tip positioned at SVC/RA junction. Port is ready for immediate use.   Complications: None  EBL: < 10 mL  Recommendations:  - Ok to shower in 24 hours - Do not submerge for 7 days - Routine line care   Avilene Marrin T. Toshi Ishii, M.D Pager:  [phone]   

## 2020-10-01 NOTE — Consult Note (Signed)
Chief Complaint: Patient was seen in consultation today for port a cath placement  Referring Physician(s): Cornett,Thomas/Rao,A  Supervising Physician: Aletta Edouard  Patient Status: [facility] - Out-pt  History of Present Illness: Tricia Berry is a 49 y.o. female with past medical history of diverticulosis, GERD, IBS, scoliosis, anxiety and newly diagnosed left breast carcinoma who presents today for Port-A-Cath placement for chemotherapy.  Past Medical History:  Diagnosis Date  . Anxiety   . Breast cancer ([place])   . Diverticulitis   . GERD (gastroesophageal reflux disease)   . IBS (irritable bowel syndrome)   . Scoliosis     Past Surgical History:  Procedure Laterality Date  . ABDOMINAL HYSTERECTOMY    . BREAST BIOPSY Left 09/15/2020   [place] bx of mass, path pending, Q marker  . BREAST BIOPSY Left 09/15/2020   [place] bx of LN, hydromarker, path pending    Allergies: Morphine, Sertraline hcl, Sulfamethoxazole, and Sulfonamide derivatives  Medications: Prior to Admission medications   Medication Sig Start Date End Date Taking? Authorizing Provider  cyclobenzaprine (FLEXERIL) 10 MG tablet Take 1 tablet (10 mg total) by mouth 3 (three) times daily as needed for muscle spasms. Patient not taking: Reported on 09/24/2020 08/13/20   Chevis Pretty, FNP  dexamethasone (DECADRON) 4 MG tablet Take 2 tablets (8 mg total) by mouth daily. Take daily for 3 days after chemo. Take with food. 09/30/20   Sindy Guadeloupe, MD  hydrOXYzine (VISTARIL) 50 MG capsule Take 1 capsule (50 mg total) by mouth 4 (four) times daily as needed for anxiety. 09/08/20   Philip Aspen, CNM  Lactobacillus (PROBIOTIC ACIDOPHILUS PO) Take 1 capsule by mouth daily.    [provider]  lidocaine-prilocaine (EMLA) cream Apply to affected area once 09/30/20   Sindy Guadeloupe, MD  LORazepam (ATIVAN) 0.5 MG tablet Take 1 tablet (0.5 mg total) by mouth every 6 (six) hours as needed (Nausea or vomiting).  09/30/20   Sindy Guadeloupe, MD  Multiple Vitamins-Minerals (MULTIVITAL PO) Take 1 Dose by mouth daily.    [provider]  ondansetron (ZOFRAN) 8 MG tablet Take 1 tablet (8 mg total) by mouth 2 (two) times daily as needed. Start on the third day after chemotherapy. 09/30/20   Sindy Guadeloupe, MD  oxyCODONE (OXY IR/ROXICODONE) 5 MG immediate release tablet Take 1 tablet (5 mg total) by mouth every 6 (six) hours as needed for severe pain. 09/24/20   Sindy Guadeloupe, MD  prochlorperazine (COMPAZINE) 10 MG tablet Take 1 tablet (10 mg total) by mouth every 6 (six) hours as needed (Nausea or vomiting). 09/30/20   Sindy Guadeloupe, MD     Family History  Problem Relation Age of Onset  . Hypertension Mother   . Osteoarthritis Mother   . Diverticulitis Mother   . Heart failure Father   . Hypertension Father   . Gout Father   . Diverticulitis Brother   . Lung cancer Maternal Uncle   . Lung cancer Maternal Uncle     Social History   Socioeconomic History  . Marital status: Divorced    Spouse name: Not on file  . Number of children: 2  . Years of education: Not on file  . Highest education level: Not on file  Occupational History  . Occupation: Optician, dispensing: [employer]  Tobacco Use  . Smoking status: Current Every Day Smoker  . Smokeless tobacco: Never Used  Substance and Sexual Activity  . Alcohol use: Yes  .  Drug use: No  . Sexual activity: Not Currently    Birth control/protection: None  Other Topics Concern  . 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 She and her spouse are primary caregivers.    Social Determinants of Health   Financial Resource Strain: Not on file  Food Insecurity: Not on file  Transportation Needs: Not on file  Physical Activity: Not on file  Stress: Not on file  Social Connections: Not on file      Review of Systems currently denies fever, headache, chest  pain, dyspnea, cough, abdominal pain, nausea, vomiting or bleeding.  She does have chronic back pain.  Vital Signs: Vitals:   10/01/20 1334  BP: (!) 136/99  Pulse: 71  Resp: 15  Temp: 98.2 F (36.8 C)  SpO2: 99%      Physical Exam awake, alert.  Chest clear to auscultation bilaterally.  Heart with regular rate and rhythm.  Abdomen soft, positive bowel sounds, nontender.  No lower extremity edema.  Imaging: NM PET Image Initial (PI) Skull Base To Thigh  Result Date: 09/29/2020 CLINICAL DATA:  Initial treatment strategy for LEFT breast carcinoma. COVID research shot RIGHT arm. EXAM: NUCLEAR MEDICINE PET SKULL BASE TO THIGH TECHNIQUE: 6.5 mCi F-18 FDG was injected intravenously. Full-ring PET imaging was performed from the skull base to thigh after the radiotracer. CT data was obtained and used for attenuation correction and anatomic localization. Fasting blood glucose: 95 mg/dl COMPARISON:  Breast MRI 09/15/2020 FINDINGS: Mediastinal blood pool activity: SUV max 2.1 Liver activity: SUV max 2.7 NECK: No hypermetabolic lymph nodes in the neck. Incidental CT findings: none CHEST: Intense metabolic activity in the posterior lateral aspect of the LEFT breast near the skin surface with SUV max equal 3.9 (image 148). No hypermetabolic LEFT axillary lymph nodes. No hypermetabolic internal mammary lymph nodes or mediastinal lymph nodes. Mildly metabolic RIGHT axial lymph nodes have normal morphology there are favored related to recent COVID vaccination in RIGHT arm (SUV max equal 1.8 on image 189). Vaccination inflammation noted in the adjacent musculature. Incidental CT findings: none ABDOMEN/PELVIS: No abnormal hypermetabolic activity within the liver, pancreas, adrenal glands, or spleen. No hypermetabolic lymph nodes in the abdomen or pelvis. Incidental CT findings: Low-attenuation within liver suggests mild hepatic steatosis. Multiple diverticula of the sigmoid colon without acute inflammation.  SKELETON: No focal hypermetabolic activity to suggest skeletal metastasis. Incidental CT findings: none IMPRESSION: 1. Hypermetabolic lesion in the lateral LEFT breast consistent primary breast carcinoma. 2. No evidence of local LEFT axillary nodal metastasis. 3. No evidence of central thoracic nodal metastasis. 4. No evidence distant metastatic disease. 5. Mild metabolic activity of RIGHT axial lymph nodes related to COVID vaccination. 6. Incidental findings of mild hepatic steatosis and diverticulosis. Electronically Signed   By: Suzy Bouchard M.D.   On: 09/29/2020 17:18   US BREAST LTD UNI LEFT INC AXILLA  Result Date: 09/08/2020 CLINICAL DATA:  Patient complains of an ulcerated palpable mass in the left breast. The patient states the mass has been present for approximately 1 year. EXAM: DIGITAL DIAGNOSTIC BILATERAL MAMMOGRAM WITH CAD AND TOMO ULTRASOUND LEFT BREAST COMPARISON:  Previous exam(s). ACR Breast Density Category  choose FINDINGS: No suspicious mass or malignant type microcalcifications identified in the right breast. In the upper-outer quadrant of the left breast there is a spiculated mass with malignant type microcalcifications spanning an area of 5.4 cm. Abnormal  lymph nodes are seen in the left axilla. Mammographic images were processed with CAD. On physical exam, there is a palpable ulcerated mass in the 1 o'clock region of the left breast 3 cm from the nipple. Targeted ultrasound is performed, showing contiguous hypoechoic interconnected masses in the left breast from 10:00 to 2:00 spanning at least an area of at least 4.8 cm. Sonographic evaluation of the left axilla shows 5 abnormal lymph nodes with cortical thickening. IMPRESSION: Suspicious interconnected masses in the left breast spanning from 10:00 to 2:00 and abnormal axillary adenopathy. RECOMMENDATION: Ultrasound-guided core biopsies of the medial and lateral extent of the masses (10 o'clock and 2 o'clock) in the left breast as  well as a left axillary lymph node is recommended. I have discussed the findings and recommendations with the patient. If applicable, a reminder letter will be sent to the patient regarding the next appointment. BI-RADS CATEGORY  5: Highly suggestive of malignancy. Electronically Signed   By: Lillia Mountain M.D.   On: 09/08/2020 15:48   MM DIAG BREAST TOMO BILATERAL  Result Date: 09/08/2020 CLINICAL DATA:  Patient complains of an ulcerated palpable mass in the left breast. The patient states the mass has been present for approximately 1 year. EXAM: DIGITAL DIAGNOSTIC BILATERAL MAMMOGRAM WITH CAD AND TOMO ULTRASOUND LEFT BREAST COMPARISON:  Previous exam(s). ACR Breast Density Category  choose FINDINGS: No suspicious mass or malignant type microcalcifications identified in the right breast. In the upper-outer quadrant of the left breast there is a spiculated mass with malignant type microcalcifications spanning an area of 5.4 cm. Abnormal lymph nodes are seen in the left axilla. Mammographic images were processed with CAD. On physical exam, there is a palpable ulcerated mass in the 1 o'clock region of the left breast 3 cm from the nipple. Targeted ultrasound is performed, showing contiguous hypoechoic interconnected masses in the left breast from 10:00 to 2:00 spanning at least an area of at least 4.8 cm. Sonographic evaluation of the left axilla shows 5 abnormal lymph nodes with cortical thickening. IMPRESSION: Suspicious interconnected masses in the left breast spanning from 10:00 to 2:00 and abnormal axillary adenopathy. RECOMMENDATION: Ultrasound-guided core biopsies of the medial and lateral extent of the masses (10 o'clock and 2 o'clock) in the left breast as well as a left axillary lymph node is recommended. I have discussed the findings and recommendations with the patient. If applicable, a reminder letter will be sent to the patient regarding the next appointment. BI-RADS CATEGORY  5: Highly suggestive of  malignancy. Electronically Signed   By: Lillia Mountain M.D.   On: 09/08/2020 15:48   MM CLIP PLACEMENT LEFT  Result Date: 09/15/2020 CLINICAL DATA:  Status post LEFT breast mass and lymph node biopsy EXAM: DIAGNOSTIC LEFT MAMMOGRAM POST ULTRASOUND BIOPSY COMPARISON:  Previous exam(s). FINDINGS: Site 1: Ulcerating LEFT breast mass. Mammographic images were obtained following ultrasound guided biopsy of a irregular LEFT outer breast mass. The Q biopsy marking clip is in expected position at the site of biopsy. Site 2: LEFT axillary lymph node Mammographic images were obtained following ultrasound guided biopsy of a LEFT axillary lymph node without a fatty hila. [facility] biopsy marking clip is in expected position at the site of biopsy. IMPRESSION: 1. Appropriate positioning of the Q shaped biopsy marking clip at the site of biopsy in the outer breast. 2. Appropriate positioning of the [facility] shaped biopsy marking clip at the site of biopsy in the axilla. Final Assessment: Post Procedure Mammograms for Marker Placement Electronically  Signed   By: Valentino Saxon MD   On: 09/15/2020 14:27   [place] LT BREAST BX W LOC DEV 1ST LESION IMG BX SPEC US GUIDE  Addendum Date: 09/21/2020   ADDENDUM REPORT: 09/21/2020 12:41 ADDENDUM: PATHOLOGY revealed: Site A. LEFT BREAST, 2:00 5 CMFN; ULTRASOUND-GUIDED BIOPSY: - INVASIVE MAMMARY CARCINOMA, NO SPECIAL TYPE. 10 mm in this sample. Grade 2. Ductal carcinoma in situ: Present, with comedonecrosis. Lymphovascular invasion: Not identified. Pathology results are CONCORDANT with imaging findings, per Dr. Valentino Saxon. PATHOLOGY revealed: Site B. LYMPH NODE, LEFT AXILLARY; ULTRASOUND-GUIDED BIOPSY: - METASTATIC MAMMARY CARCINOMA, AS DESCRIBED ABOVE. Comment: Definitive lymph node tissue is not identified in this sample. Pathology results are CONCORDANT with imaging findings, per Dr. Valentino Saxon. Pathology results and recommendations below were discussed with  patient by telephone on 09/20/2020. Patient reported biopsy site within normal limits with slight tenderness at the site. Post biopsy care instructions were reviewed, questions were answered and my direct phone number was provided to patient. Patient was instructed to call [facility] if any concerns or questions arise related to the biopsy. Recommendation: 1. Surgical and oncological consultation: Request for surgical and oncological consultation relayed to Al Pimple RN and Tanya Nones RN at [facility] by Electa Sniff RN on 09/20/2020. 2. Consider bilateral breast MRI with and without contrast to evaluate extent of breast disease. Pathology results reported by Electa Sniff RN on 09/21/2020. Electronically Signed   By: Valentino Saxon MD   On: 09/21/2020 12:41   Result Date: 09/21/2020 CLINICAL DATA:  Ulcerating LEFT breast mass and abnormal LEFT axillary lymph nodes EXAM: ULTRASOUND GUIDED LEFT BREAST CORE NEEDLE BIOPSY x 2 COMPARISON:  Previous exam(s). PROCEDURE: I met with the patient and we discussed the procedure of ultrasound-guided biopsy, including benefits and alternatives. We discussed the high likelihood of a successful procedure. We discussed the risks of the procedure, including infection, bleeding, tissue injury, clip migration, and inadequate sampling. Informed written consent was given. The usual time-out protocol was performed immediately prior to the procedure. Site 1: Ulcerating LEFT breast mass Lesion quadrant: Upper outer quadrant Using sterile technique and 1% lidocaine and 1% lidocaine with epinephrine as local anesthetic, under direct ultrasound visualization, a 14 gauge spring-loaded device was used to perform biopsy of a mass using a a lateral approach. At the conclusion of the procedure a Q tissue marker clip was deployed into the biopsy cavity. Follow up 2 view mammogram was performed and dictated separately. Site 2: Lymph node with likely  associated calcifications in the axilla Lesion quadrant: Upper outer quadrant Using sterile technique and 1% lidocaine and 1% lidocaine with epinephrine as local anesthetic, under direct ultrasound visualization, a 14 gauge spring-loaded device was used to perform biopsy of a replaced lymph node using an inferolateral approach. At the conclusion of the procedure a HYDROMARK tissue marker clip was deployed into the biopsy cavity. Follow up 2 view mammogram was performed and dictated separately. IMPRESSION: Ultrasound guided biopsy of a LEFT breast mass and abnormal LEFT axillary lymph node with likely associated calcifications. No apparent complications. Electronically Signed: By: Valentino Saxon MD On: 09/15/2020 14:30   [place] LT BREAST BX W LOC DEV EA ADD LESION IMG BX SPEC US GUIDE  Addendum Date: 09/21/2020   ADDENDUM REPORT: 09/21/2020 12:41 ADDENDUM: PATHOLOGY revealed: Site A. LEFT BREAST, 2:00 5 CMFN; ULTRASOUND-GUIDED BIOPSY: - INVASIVE MAMMARY CARCINOMA, NO SPECIAL TYPE. 10 mm in this sample. Grade 2. Ductal carcinoma in situ: Present, with comedonecrosis. Lymphovascular invasion:  Not identified. Pathology results are CONCORDANT with imaging findings, per Dr. Valentino Saxon. PATHOLOGY revealed: Site B. LYMPH NODE, LEFT AXILLARY; ULTRASOUND-GUIDED BIOPSY: - METASTATIC MAMMARY CARCINOMA, AS DESCRIBED ABOVE. Comment: Definitive lymph node tissue is not identified in this sample. Pathology results are CONCORDANT with imaging findings, per Dr. Valentino Saxon. Pathology results and recommendations below were discussed with patient by telephone on 09/20/2020. Patient reported biopsy site within normal limits with slight tenderness at the site. Post biopsy care instructions were reviewed, questions were answered and my direct phone number was provided to patient. Patient was instructed to call [facility] if any concerns or questions arise related to the biopsy. Recommendation: 1. Surgical and  oncological consultation: Request for surgical and oncological consultation relayed to Al Pimple RN and Tanya Nones RN at [facility] by Electa Sniff RN on 09/20/2020. 2. Consider bilateral breast MRI with and without contrast to evaluate extent of breast disease. Pathology results reported by Electa Sniff RN on 09/21/2020. Electronically Signed   By: Valentino Saxon MD   On: 09/21/2020 12:41   Result Date: 09/21/2020 CLINICAL DATA:  Ulcerating LEFT breast mass and abnormal LEFT axillary lymph nodes EXAM: ULTRASOUND GUIDED LEFT BREAST CORE NEEDLE BIOPSY x 2 COMPARISON:  Previous exam(s). PROCEDURE: I met with the patient and we discussed the procedure of ultrasound-guided biopsy, including benefits and alternatives. We discussed the high likelihood of a successful procedure. We discussed the risks of the procedure, including infection, bleeding, tissue injury, clip migration, and inadequate sampling. Informed written consent was given. The usual time-out protocol was performed immediately prior to the procedure. Site 1: Ulcerating LEFT breast mass Lesion quadrant: Upper outer quadrant Using sterile technique and 1% lidocaine and 1% lidocaine with epinephrine as local anesthetic, under direct ultrasound visualization, a 14 gauge spring-loaded device was used to perform biopsy of a mass using a a lateral approach. At the conclusion of the procedure a Q tissue marker clip was deployed into the biopsy cavity. Follow up 2 view mammogram was performed and dictated separately. Site 2: Lymph node with likely associated calcifications in the axilla Lesion quadrant: Upper outer quadrant Using sterile technique and 1% lidocaine and 1% lidocaine with epinephrine as local anesthetic, under direct ultrasound visualization, a 14 gauge spring-loaded device was used to perform biopsy of a replaced lymph node using an inferolateral approach. At the conclusion of the procedure a HYDROMARK tissue marker clip  was deployed into the biopsy cavity. Follow up 2 view mammogram was performed and dictated separately. IMPRESSION: Ultrasound guided biopsy of a LEFT breast mass and abnormal LEFT axillary lymph node with likely associated calcifications. No apparent complications. Electronically Signed: By: Valentino Saxon MD On: 09/15/2020 14:30    Labs:  CBC: Recent Labs    08/23/20 1033  WBC 7.6  HGB 14.3  HCT 42.9  PLT 222    COAGS: No results for input(s): INR, APTT in the last 8760 hours.  BMP: No results for input(s): NA, K, CL, CO2, GLUCOSE, BUN, CALCIUM, CREATININE, GFRNONAA, GFRAA in the last 8760 hours.  Invalid input(s): CMP  LIVER FUNCTION TESTS: No results for input(s): BILITOT, AST, ALT, ALKPHOS, PROT, ALBUMIN in the last 8760 hours.  TUMOR MARKERS: No results for input(s): AFPTM, CEA, CA199, CHROMGRNA in the last 8760 hours.  Assessment and Plan: 49 y.o. female with past medical history of diverticulosis, GERD, IBS, scoliosis, anxiety and newly diagnosed left breast carcinoma who presents today for Port-A-Cath placement for chemotherapy.Risks and benefits of image guided  port-a-catheter placement was discussed with the patient including, but not limited to bleeding, infection, pneumothorax, or fibrin sheath development and need for additional procedures.  All of the patient's questions were answered, patient is agreeable to proceed. Consent signed and in chart.     Thank you for this interesting consult.  I greatly enjoyed meeting HISAKO BUGH and look forward to participating in their care.  A copy of this report was sent to the requesting provider on this date.  Electronically Signed: D. Rowe Robert, PA-C 10/01/2020, 1:16 PM   I spent a total of 25 minutes    in face to face in clinical consultation, greater than 50% of which was counseling/coordinating care for Port-A-Cath placement

## 2020-10-04 ENCOUNTER — Encounter: Payer: Self-pay | Admitting: *Deleted

## 2020-10-04 NOTE — Progress Notes (Unsigned)
Patient called with lots of questions.  She is interested in smoking cessation prior to starting chemotherapy on Friday.  Will send message to Rulon Abide, NP for assistance.  Will send pink ribbon approval for a wig to [facility] [employer] [facility] [place] for financial assistance for a wig.  She will discuss the Care Program with [name] for approval for the exercise program.  Offered support.

## 2020-10-05 ENCOUNTER — Ambulatory Visit
Admission: RE | Admit: 2020-10-05 | Discharge: 2020-10-05 | Disposition: A | Discharge status: Home / Self Care | Payer: 59 | Source: Ambulatory Visit | Attending: Oncology | Admitting: Oncology

## 2020-10-05 ENCOUNTER — Other Ambulatory Visit: Payer: Self-pay

## 2020-10-05 DIAGNOSIS — Z01818 Encounter for other preprocedural examination: Secondary | ICD-10-CM | POA: Insufficient documentation

## 2020-10-05 DIAGNOSIS — F419 Anxiety disorder, unspecified: Secondary | ICD-10-CM | POA: Insufficient documentation

## 2020-10-05 DIAGNOSIS — Z0189 Encounter for other specified special examinations: Secondary | ICD-10-CM

## 2020-10-05 DIAGNOSIS — Z170 Estrogen receptor positive status [ER+]: Secondary | ICD-10-CM | POA: Diagnosis not present

## 2020-10-05 DIAGNOSIS — C50412 Malignant neoplasm of upper-outer quadrant of left female breast: Secondary | ICD-10-CM | POA: Diagnosis not present

## 2020-10-05 LAB — ECHOCARDIOGRAM COMPLETE
AR max vel: 2.04 cm2
AV Area VTI: 2.08 cm2
AV Area mean vel: 1.76 cm2
AV Mean grad: 3 mmHg
AV Peak grad: 6 mmHg
Ao pk vel: 1.22 m/s
Area-P 1/2: 3.4 cm2
Calc EF: 60.6 %
S' Lateral: 2.68 cm
Single Plane A2C EF: 54.1 %
Single Plane A4C EF: 65.1 %

## 2020-10-05 NOTE — Progress Notes (Signed)
*  PRELIMINARY RESULTS* Echocardiogram 2D Echocardiogram has been performed.  Sherrie Sport 10/05/2020, 12:05 PM

## 2020-10-06 ENCOUNTER — Ambulatory Visit (INDEPENDENT_AMBULATORY_CARE_PROVIDER_SITE_OTHER): Payer: 59

## 2020-10-06 ENCOUNTER — Encounter: Payer: Self-pay | Admitting: Family

## 2020-10-06 ENCOUNTER — Other Ambulatory Visit: Payer: Self-pay | Admitting: Family

## 2020-10-06 ENCOUNTER — Ambulatory Visit: Payer: 59 | Admitting: Family

## 2020-10-06 DIAGNOSIS — F411 Generalized anxiety disorder: Secondary | ICD-10-CM | POA: Diagnosis not present

## 2020-10-06 DIAGNOSIS — G8929 Other chronic pain: Secondary | ICD-10-CM | POA: Insufficient documentation

## 2020-10-06 DIAGNOSIS — M545 Low back pain, unspecified: Secondary | ICD-10-CM

## 2020-10-06 DIAGNOSIS — Z0000 Encounter for general adult medical examination without abnormal findings: Secondary | ICD-10-CM | POA: Diagnosis not present

## 2020-10-06 MED ORDER — DULOXETINE HCL 30 MG PO CPEP: ORAL_CAPSULE | ORAL | 3 refills | Status: DC

## 2020-10-06 NOTE — Progress Notes (Signed)
Subjective:    Patient ID: Tricia Berry, female    DOB: 1971/05/27, 49 y.o.   MRN: [id number]  CC: Tricia Berry is a 49 y.o. female who presents today to establish care.    HPI: Establish care No prior PCP.   GAD- worsened recently in setting of breast cancer diagnosis. Trouble falling asleep. Mind is racing.  No panic attacks. NO sob, cp. No si/hi.    Years ago had been on valium after father's death due to grief. Has been on effexor didn't work, and zoloft which caused diarrhea. Tried trazodone which caused HA.   She doesn't feel she has depression. Has supportive partner of many years.   Not taking atarax given most recently by Philip Aspen, CMN, for anxiety.  Not taking ativan however prescribed for use ahead of chemotherapy.   Flexeril, or compazine although may use for chemotherapy as needed.   Chronic back pain- h/o scoliosis. Reports numbness distal feet biaterally. No numbness in groin, trouble urinating.  PET scan 09/29/20 showed no bony metastasis  Left breast pain after biopsy, using oxycodone most days BID.   Left breast cancer diagnosed 08/2020. Starting chemotherapy this week  Referred for genetics counseling.  Follows with Dr Janese Banks She is taking oxycodone as prescribed by Dr Janese Banks     GYN - following with Philip Aspen.   Smoker HISTORY:  Past Medical History:  Diagnosis Date  . Anxiety   . Breast cancer ([place])   . Diverticulitis   . GERD (gastroesophageal reflux disease)   . IBS (irritable bowel syndrome)   . Scoliosis    Past Surgical History:  Procedure Laterality Date  . ABDOMINAL HYSTERECTOMY     still has ovaries, no gyn cancer, hysterectomy due to endometriosis.   Marland Kitchen BREAST BIOPSY Left 09/15/2020   [place] bx of mass, path pending, Q marker  . BREAST BIOPSY Left 09/15/2020   [place] bx of LN, hydromarker, path pending  . IR IMAGING GUIDED PORT INSERTION  10/01/2020   Family History  Problem Relation Age of Onset  . Hypertension  Mother   . Osteoarthritis Mother   . Diverticulitis Mother   . Heart failure Father   . Hypertension Father   . Gout Father   . Diverticulitis Brother   . Lung cancer Maternal Uncle   . Lung cancer Maternal Uncle     Allergies: Morphine, Sertraline hcl, Sulfamethoxazole, and Sulfonamide derivatives Current Outpatient Medications on File Prior to Visit  Medication Sig Dispense Refill  . acetaminophen (TYLENOL) 325 MG tablet Take 650 mg by mouth every 6 (six) hours as needed.    . Lactobacillus (PROBIOTIC ACIDOPHILUS PO) Take 1 capsule by mouth daily.    . Multiple Vitamins-Minerals (MULTIVITAL PO) Take 1 Dose by mouth daily.    Marland Kitchen oxyCODONE (OXY IR/ROXICODONE) 5 MG immediate release tablet Take 1 tablet (5 mg total) by mouth every 6 (six) hours as needed for severe pain. 120 tablet 0  . cyclobenzaprine (FLEXERIL) 10 MG tablet Take 1 tablet (10 mg total) by mouth 3 (three) times daily as needed for muscle spasms. (Patient not taking: No sig reported) 30 tablet 0  . lidocaine-prilocaine (EMLA) cream Apply to affected area once (Patient not taking: No sig reported) 30 g 3  . LORazepam (ATIVAN) 0.5 MG tablet Take 1 tablet (0.5 mg total) by mouth every 6 (six) hours as needed (Nausea or vomiting). 30 tablet 0  . ondansetron (ZOFRAN) 8 MG tablet Take 1 tablet (8 mg total) by mouth  2 (two) times daily as needed. Start on the third day after chemotherapy. 30 tablet 1  . prochlorperazine (COMPAZINE) 10 MG tablet Take 1 tablet (10 mg total) by mouth every 6 (six) hours as needed (Nausea or vomiting). 30 tablet 1   No current facility-administered medications on file prior to visit.    Social History   Tobacco Use  . Smoking status: Current Every Day Smoker  . Smokeless tobacco: Never Used  Substance Use Topics  . Alcohol use: Yes  . Drug use: No    Review of Systems  Constitutional: Negative for chills and fever.  Respiratory: Negative for cough.   Cardiovascular: Negative for chest pain  and palpitations.  Gastrointestinal: Negative for nausea and vomiting.  Musculoskeletal: Positive for back pain. Negative for arthralgias.  Neurological: Positive for numbness.  Psychiatric/Behavioral: Positive for sleep disturbance. Negative for suicidal ideas. The patient is nervous/anxious.       Objective:    There were no vitals taken for this visit. BP Readings from Last 3 Encounters:  10/08/20 127/83  10/08/20 (!) 132/91  10/01/20 (!) 139/92   Wt Readings from Last 3 Encounters:  10/08/20 117 lb 11.2 oz (53.4 kg)  10/01/20 120 lb (54.4 kg)  09/24/20 122 lb 8 oz (55.6 kg)    Physical Exam Vitals reviewed.  Constitutional:      Appearance: She is well-developed and well-nourished.  Eyes:     Conjunctiva/sclera: Conjunctivae normal.  Cardiovascular:     Rate and Rhythm: Normal rate and regular rhythm.     Pulses: Normal pulses.     Heart sounds: Normal heart sounds.  Pulmonary:     Effort: Pulmonary effort is normal.     Breath sounds: Normal breath sounds. No wheezing, rhonchi or rales.  Musculoskeletal:     Lumbar back: No swelling, edema, pain, spasms, tenderness or bony tenderness. Normal range of motion.     Comments: Palpable lumbar scoliosis.  Full range of motion with flexion, tension, lateral side bends. No bony tenderness. No pain, numbness, tingling elicited with single leg raise bilaterally.   Skin:    General: Skin is warm and dry.  Neurological:     Mental Status: She is alert.     Sensory: No sensory deficit.     Deep Tendon Reflexes: Strength normal.     Reflex Scores:      Patellar reflexes are 2+ on the right side and 2+ on the left side.    Comments: Sensation and strength intact bilateral lower extremities.  Psychiatric:        Mood and Affect: Mood and affect normal.        Speech: Speech normal.        Behavior: Behavior normal.        Thought Content: Thought content normal.        Assessment & Plan:   Problem List Items Addressed  This Visit      Other   Anxiety state    Uncontrolled. Start cymbalta for additional benefit due to h/o low back pain and titrate to 60mg . Close follow up.       Relevant Medications   DULoxetine (CYMBALTA) 30 MG capsule   Encounter for medical examination to establish care    Reviewed past medical history.       Low back pain - Primary    Chronic. Lumbar xray shows severe DDD, scoliosis. Advised orthopedic consult. Trial of cymbalta.       Relevant Medications   DULoxetine (  CYMBALTA) 30 MG capsule   Other Relevant Orders   DG Lumbar Spine Complete (Completed)       I have discontinued Chele G. [place] "[name]"'s hydrOXYzine and dexamethasone. I am also having her start on DULoxetine. Additionally, I am having her maintain her cyclobenzaprine, Multiple Vitamins-Minerals (MULTIVITAL PO), Lactobacillus (PROBIOTIC ACIDOPHILUS PO), oxyCODONE, lidocaine-prilocaine, ondansetron, prochlorperazine, LORazepam, and acetaminophen.   Meds ordered this encounter  Medications  . DULoxetine (CYMBALTA) 30 MG capsule    Sig: Take one 30 mg tablet by mouth once a day for the first week. Then increase to two 30 mg tablets ( total 60mg ) by mouth once daily.    Dispense:  60 capsule    Refill:  3    Order Specific Question:   Supervising Provider    Answer:   Crecencio Mc [[id number]]    Return precautions given.   Risks, benefits, and alternatives of the medications and treatment plan prescribed today were discussed, and patient expressed understanding.   Education regarding symptom management and diagnosis given to patient on AVS.  Continue to follow with Burnard Hawthorne, FNP for routine health maintenance.   Alden Benjamin Fraiser and I agreed with plan.   Mable Paris, FNP

## 2020-10-06 NOTE — Patient Instructions (Addendum)
Please let me know when we can pursue and refer you for your colonoscopy.   Start cymbalta  So nice to meet you.

## 2020-10-07 ENCOUNTER — Other Ambulatory Visit: Payer: Self-pay | Admitting: *Deleted

## 2020-10-07 DIAGNOSIS — C50412 Malignant neoplasm of upper-outer quadrant of left female breast: Secondary | ICD-10-CM

## 2020-10-08 ENCOUNTER — Inpatient Hospital Stay: Payer: 59

## 2020-10-08 ENCOUNTER — Other Ambulatory Visit: Payer: Self-pay

## 2020-10-08 ENCOUNTER — Encounter: Payer: Self-pay | Admitting: Oncology

## 2020-10-08 ENCOUNTER — Inpatient Hospital Stay (HOSPITAL_BASED_OUTPATIENT_CLINIC_OR_DEPARTMENT_OTHER): Payer: 59 | Admitting: Oncology

## 2020-10-08 VITALS — BP 132/91 | HR 87 | Temp 97.6°F | Wt 117.7 lb

## 2020-10-08 VITALS — BP 127/83 | HR 76 | Resp 16

## 2020-10-08 DIAGNOSIS — Z170 Estrogen receptor positive status [ER+]: Secondary | ICD-10-CM

## 2020-10-08 DIAGNOSIS — R110 Nausea: Secondary | ICD-10-CM | POA: Diagnosis not present

## 2020-10-08 DIAGNOSIS — C50412 Malignant neoplasm of upper-outer quadrant of left female breast: Secondary | ICD-10-CM | POA: Diagnosis not present

## 2020-10-08 DIAGNOSIS — L98499 Non-pressure chronic ulcer of skin of other sites with unspecified severity: Secondary | ICD-10-CM | POA: Diagnosis not present

## 2020-10-08 DIAGNOSIS — Z79899 Other long term (current) drug therapy: Secondary | ICD-10-CM | POA: Diagnosis not present

## 2020-10-08 DIAGNOSIS — Z7189 Other specified counseling: Secondary | ICD-10-CM | POA: Diagnosis not present

## 2020-10-08 DIAGNOSIS — Z5111 Encounter for antineoplastic chemotherapy: Secondary | ICD-10-CM | POA: Diagnosis not present

## 2020-10-08 DIAGNOSIS — F419 Anxiety disorder, unspecified: Secondary | ICD-10-CM | POA: Diagnosis not present

## 2020-10-08 DIAGNOSIS — G893 Neoplasm related pain (acute) (chronic): Secondary | ICD-10-CM | POA: Diagnosis not present

## 2020-10-08 DIAGNOSIS — C3490 Malignant neoplasm of unspecified part of unspecified bronchus or lung: Secondary | ICD-10-CM | POA: Diagnosis not present

## 2020-10-08 LAB — CBC WITH DIFFERENTIAL/PLATELET
Abs Immature Granulocytes: 0.02 10*3/uL (ref 0.00–0.07)
Basophils Absolute: 0.1 10*3/uL (ref 0.0–0.1)
Basophils Relative: 1 %
Eosinophils Absolute: 0.1 10*3/uL (ref 0.0–0.5)
Eosinophils Relative: 1 %
HCT: 40.7 % (ref 36.0–46.0)
Hemoglobin: 13.8 g/dL (ref 12.0–15.0)
Immature Granulocytes: 0 %
Lymphocytes Relative: 46 %
Lymphs Abs: 4.6 10*3/uL — ABNORMAL HIGH (ref 0.7–4.0)
MCH: 30.6 pg (ref 26.0–34.0)
MCHC: 33.9 g/dL (ref 30.0–36.0)
MCV: 90.2 fL (ref 80.0–100.0)
Monocytes Absolute: 0.6 10*3/uL (ref 0.1–1.0)
Monocytes Relative: 7 %
Neutro Abs: 4.4 10*3/uL (ref 1.7–7.7)
Neutrophils Relative %: 45 %
Platelets: 250 10*3/uL (ref 150–400)
RBC: 4.51 MIL/uL (ref 3.87–5.11)
RDW: 12.8 % (ref 11.5–15.5)
WBC: 9.8 10*3/uL (ref 4.0–10.5)
nRBC: 0 % (ref 0.0–0.2)

## 2020-10-08 LAB — HEPATITIS B SURFACE ANTIGEN: Hepatitis B Surface Ag: NONREACTIVE

## 2020-10-08 LAB — COMPREHENSIVE METABOLIC PANEL
ALT: 28 U/L (ref 0–44)
AST: 27 U/L (ref 15–41)
Albumin: 4.2 g/dL (ref 3.5–5.0)
Alkaline Phosphatase: 71 U/L (ref 38–126)
Anion gap: 12 (ref 5–15)
BUN: 8 mg/dL (ref 6–20)
CO2: 25 mmol/L (ref 22–32)
Calcium: 9.6 mg/dL (ref 8.9–10.3)
Chloride: 103 mmol/L (ref 98–111)
Creatinine, Ser: 0.74 mg/dL (ref 0.44–1.00)
GFR, Estimated: >60 mL/min (ref >60)
Glucose, Bld: 114 mg/dL — ABNORMAL HIGH (ref 70–99)
Potassium: 3.7 mmol/L (ref 3.5–5.1)
Sodium: 140 mmol/L (ref 135–145)
Total Bilirubin: 0.6 mg/dL (ref 0.3–1.2)
Total Protein: 7.7 g/dL (ref 6.5–8.1)

## 2020-10-08 LAB — HEPATITIS B CORE ANTIBODY, TOTAL: Hep B Core Total Ab: NONREACTIVE

## 2020-10-08 MED ORDER — SODIUM CHLORIDE 0.9 % IV SOLN
653.0000 mg/m2 | Freq: Once | INTRAVENOUS | Status: AC
  Administered 2020-10-08: 1000 mg via INTRAVENOUS
  Filled 2020-10-08: qty 50

## 2020-10-08 MED ORDER — SODIUM CHLORIDE 0.9 % IV SOLN
150.0000 mg | Freq: Once | INTRAVENOUS | Status: AC
  Administered 2020-10-08: 150 mg via INTRAVENOUS
  Filled 2020-10-08: qty 150

## 2020-10-08 MED ORDER — PALONOSETRON HCL INJECTION 0.25 MG/5ML
0.2500 mg | Freq: Once | INTRAVENOUS | Status: AC
  Administered 2020-10-08: 0.25 mg via INTRAVENOUS
  Filled 2020-10-08: qty 5

## 2020-10-08 MED ORDER — HEPARIN SOD (PORK) LOCK FLUSH 100 UNIT/ML IV SOLN
INTRAVENOUS | Status: AC
  Filled 2020-10-08: qty 5

## 2020-10-08 MED ORDER — DOXORUBICIN HCL CHEMO IV INJECTION 2 MG/ML
60.0000 mg/m2 | Freq: Once | INTRAVENOUS | Status: AC
  Administered 2020-10-08: 92 mg via INTRAVENOUS
  Filled 2020-10-08: qty 46

## 2020-10-08 MED ORDER — SODIUM CHLORIDE 0.9 % IV SOLN
10.0000 mg | Freq: Once | INTRAVENOUS | Status: AC
  Administered 2020-10-08: 10 mg via INTRAVENOUS
  Filled 2020-10-08: qty 10

## 2020-10-08 MED ORDER — HEPARIN SOD (PORK) LOCK FLUSH 100 UNIT/ML IV SOLN
500.0000 [IU] | Freq: Once | INTRAVENOUS | Status: DC | PRN
  Filled 2020-10-08: qty 5

## 2020-10-08 MED ORDER — SODIUM CHLORIDE 0.9 % IV SOLN
Freq: Once | INTRAVENOUS | Status: AC
  Filled 2020-10-08: qty 250

## 2020-10-08 MED ORDER — HEPARIN SOD (PORK) LOCK FLUSH 100 UNIT/ML IV SOLN
500.0000 [IU] | Freq: Once | INTRAVENOUS | Status: AC
  Administered 2020-10-08: 500 [IU] via INTRAVENOUS
  Filled 2020-10-08: qty 5

## 2020-10-08 MED ORDER — SODIUM CHLORIDE 0.9% FLUSH
10.0000 mL | INTRAVENOUS | Status: DC | PRN
  Administered 2020-10-08: 10 mL via INTRAVENOUS
  Filled 2020-10-08: qty 10

## 2020-10-08 MED ORDER — PEGFILGRASTIM 6 MG/0.6ML ~~LOC~~ PSKT
6.0000 mg | PREFILLED_SYRINGE | Freq: Once | SUBCUTANEOUS | Status: AC
  Administered 2020-10-08: 6 mg via SUBCUTANEOUS
  Filled 2020-10-08: qty 0.6

## 2020-10-08 NOTE — Progress Notes (Addendum)
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  She then noticed that her lump is gradually getting larger with an area of ulceration on the skin and underwent a diagnostic bilateral mammogram on 09/08/2020 which showed hypoechoic interconnected masses in the left breast from 10:00 to 2 o'clock position spanning at least an area of 4.8 cm.  Ultrasound also showed 5 abnormal lymph nodes in the axilla with cortical thickening.  Both the mass and the lymph nodes were biopsied and was consistent with invasive mammary carcinoma grade 2.  Tumor was ER +91 200%, PR +11 to 20% and HER-2 negative.  Ki-67 15%. Patient will be meeting [name] from [facility] surgery to discuss surgical management.  She is here for medical oncology recommendations.  In terms of her general health patient is otherwise doing well and does not have significant comorbidities.  She does endorse significant pain in the area of her left breast.  Tylenol and Motrin has not been helping her with this pain.  She lives with her husband and 2 children who have special needs.  No prior history of abnormal breast mammograms or breast biopsies.  PET CT scan showed hypermetabolism in the area of the left breast but no evidence of  hypermetabolism in the left axilla Or evidence of distant metastatic disease.   Interval history-patient is a little anxious to start chemotherapy today but is otherwise doing well and denies any complaints.  Pain is currently well controlled with as needed oxycodone  ECOG PS- 0 Pain scale- 3 Opioid associated constipation- no  Review of systems- Review of Systems  Constitutional: Negative for chills, fever, malaise/fatigue and weight loss.  HENT: Negative for congestion, ear discharge and nosebleeds.   Eyes: Negative for blurred vision.  Respiratory: Negative for cough, hemoptysis, sputum production, shortness of breath and wheezing.   Cardiovascular: Negative for chest pain, palpitations, orthopnea and claudication.  Gastrointestinal: Negative for abdominal pain, blood in stool, constipation, diarrhea, heartburn, melena, nausea and vomiting.  Genitourinary: Negative for dysuria, flank pain, frequency, hematuria and urgency.  Musculoskeletal: Negative for back pain, joint pain and myalgias.  Skin: Negative for rash.  Neurological: Negative for dizziness, tingling, focal weakness, seizures, weakness and headaches.  Endo/Heme/Allergies: Does not bruise/bleed easily.  Psychiatric/Behavioral: Negative for depression and suicidal ideas. The patient does not have insomnia.   Pain over area of left breast mass.    Allergies  Allergen Reactions  . Morphine Other (See Comments)    migranes  . Sertraline Hcl     REACTION: Worsened symptoms of IBS  . Sulfamethoxazole Rash  . Sulfonamide Derivatives Rash     Past Medical History:  Diagnosis Date  . Anxiety   . Breast cancer ([place])   . Diverticulitis   . GERD (gastroesophageal reflux disease)   .  IBS (irritable bowel syndrome)   . Scoliosis      Past Surgical History:  Procedure Laterality Date  . ABDOMINAL HYSTERECTOMY     still has ovaries, no gyn cancer, hysterectomy due to endometriosis.   Marland Kitchen BREAST BIOPSY Left 09/15/2020    [place] bx of mass, path pending, Q marker  . BREAST BIOPSY Left 09/15/2020   [place] bx of LN, hydromarker, path pending  . IR IMAGING GUIDED PORT INSERTION  10/01/2020    Social History   Socioeconomic History  . Marital status: Divorced    Spouse name: Not on file  . Number of children: 2  . Years of education: Not on file  . Highest education level: Not on file  Occupational History  . Occupation: Optician, dispensing: [employer]  Tobacco Use  . Smoking status: Current Every Day Smoker  . Smokeless tobacco: Never Used  Substance and Sexual Activity  . Alcohol use: Yes  . Drug use: No  . Sexual activity: Not Currently    Birth control/protection: None  Other Topics Concern  . 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 She and her spouse are primary caregivers.    Social Determinants of Health   Financial Resource Strain: Not on file  Food Insecurity: Not on file  Transportation Needs: Not on file  Physical Activity: Not on file  Stress: Not on file  Social Connections: Not on file  Intimate Partner Violence: Not on file    Family History  Problem Relation Age of Onset  . Hypertension Mother   . Osteoarthritis Mother   . Diverticulitis Mother   . Heart failure Father   . Hypertension Father   . Gout Father   . Diverticulitis Brother   . Lung cancer Maternal Uncle   . Lung cancer Maternal Uncle      Current Outpatient Medications:  .  acetaminophen (TYLENOL) 325 MG tablet, Take 650 mg by mouth every 6 (six) hours as needed., Disp: , Rfl:  .  Lactobacillus (PROBIOTIC ACIDOPHILUS PO), Take 1 capsule by mouth daily., Disp: , Rfl:  .  LORazepam (ATIVAN) 0.5 MG tablet, Take 1 tablet (0.5 mg total) by mouth every 6 (six) hours as needed (Nausea or vomiting)., Disp: 30 tablet, Rfl: 0 .  Multiple Vitamins-Minerals (MULTIVITAL PO), Take 1 Dose by mouth daily., Disp: , Rfl:  .  ondansetron  (ZOFRAN) 8 MG tablet, Take 1 tablet (8 mg total) by mouth 2 (two) times daily as needed. Start on the third day after chemotherapy., Disp: 30 tablet, Rfl: 1 .  oxyCODONE (OXY IR/ROXICODONE) 5 MG immediate release tablet, Take 1 tablet (5 mg total) by mouth every 6 (six) hours as needed for severe pain., Disp: 120 tablet, Rfl: 0 .  prochlorperazine (COMPAZINE) 10 MG tablet, Take 1 tablet (10 mg total) by mouth every 6 (six) hours as needed (Nausea or vomiting)., Disp: 30 tablet, Rfl: 1 .  cyclobenzaprine (FLEXERIL) 10 MG tablet, Take 1 tablet (10 mg total) by mouth 3 (three) times daily as needed for muscle spasms. (Patient not taking: No sig reported), Disp: 30 tablet, Rfl: 0 .  DULoxetine (CYMBALTA) 30 MG capsule, Take one 30 mg tablet by mouth once a day for the first week. Then increase to two 30 mg tablets ( total 26m) by mouth once daily., Disp: 60 capsule, Rfl: 3 .  lidocaine-prilocaine (EMLA) cream, Apply  to affected area once (Patient not taking: No sig reported), Disp: 30 g, Rfl: 3 No current facility-administered medications for this visit.  Facility-Administered Medications Ordered in Other Visits:  .  cyclophosphamide (CYTOXAN) 1,000 mg in sodium chloride 0.9 % 250 mL chemo infusion, 653 mg/m2 (Treatment Plan Recorded), Intravenous, Once, Sindy Guadeloupe, MD, Last Rate: 600 mL/hr at 10/08/20 1216, 1,000 mg at 10/08/20 1216 .  heparin lock flush 100 unit/mL, 500 Units, Intravenous, Once, Sindy Guadeloupe, MD .  heparin lock flush 100 unit/mL, 500 Units, Intracatheter, Once PRN, Sindy Guadeloupe, MD .  pegfilgrastim (NEULASTA ONPRO KIT) injection 6 mg, 6 mg, Subcutaneous, Once, Sindy Guadeloupe, MD .  sodium chloride flush (NS) 0.9 % injection 10 mL, 10 mL, Intravenous, PRN, Sindy Guadeloupe, MD, 10 mL at 10/08/20 0904  Physical exam:  Vitals:   10/08/20 1300  BP: (!) 132/91  Pulse: 87  Temp: 97.6 F (36.4 C)  TempSrc: Tympanic  SpO2: 99%  Weight: 117 lb 11.2 oz (53.4 kg)   Physical  Exam Constitutional:      General: She is not in acute distress. Eyes:     Extraocular Movements: EOM normal.  Cardiovascular:     Rate and Rhythm: Normal rate and regular rhythm.     Heart sounds: Normal heart sounds.  Pulmonary:     Effort: Pulmonary effort is normal.  Skin:    General: Skin is warm and dry.  Neurological:     Mental Status: She is alert and oriented to person, place, and time.      CMP Latest Ref Rng & Units 10/08/2020  Glucose 70 - 99 mg/dL 114(H)  BUN 6 - 20 mg/dL 8  Creatinine 0.44 - 1.00 mg/dL 0.74  Sodium 135 - 145 mmol/L 140  Potassium 3.5 - 5.1 mmol/L 3.7  Chloride 98 - 111 mmol/L 103  CO2 22 - 32 mmol/L 25  Calcium 8.9 - 10.3 mg/dL 9.6  Total Protein 6.5 - 8.1 g/dL 7.7  Total Bilirubin 0.3 - 1.2 mg/dL 0.6  Alkaline Phos 38 - 126 U/L 71  AST 15 - 41 U/L 27  ALT 0 - 44 U/L 28   CBC Latest Ref Rng & Units 10/08/2020  WBC 4.0 - 10.5 K/uL 9.8  Hemoglobin 12.0 - 15.0 g/dL 13.8  Hematocrit 36.0 - 46.0 % 40.7  Platelets 150 - 400 K/uL 250    No images are attached to the encounter.  DG Lumbar Spine Complete  Result Date: 10/07/2020 CLINICAL DATA:  Low back pain.  History of scoliosis. EXAM: LUMBAR SPINE - COMPLETE 4+ VIEW COMPARISON:  PET CT 09/29/2020. FINDINGS: Severe lumbar spine scoliosis concave right. Diffuse multilevel degenerative change. No acute or focal bony abnormalities identified. Aortoiliac atherosclerotic vascular calcification. IMPRESSION: Severe lumbar spine scoliosis concave right. Diffuse multilevel degenerative change. No acute or focal bony abnormality identified. Electronically Signed   By: Marcello Moores  Register   On: 10/07/2020 13:23   NM PET Image Initial (PI) Skull Base To Thigh  Result Date: 09/29/2020 CLINICAL DATA:  Initial treatment strategy for LEFT breast carcinoma. COVID research shot RIGHT arm. EXAM: NUCLEAR MEDICINE PET SKULL BASE TO THIGH TECHNIQUE: 6.5 mCi F-18 FDG was injected intravenously. Full-ring PET imaging was  performed from the skull base to thigh after the radiotracer. CT data was obtained and used for attenuation correction and anatomic localization. Fasting blood glucose: 95 mg/dl COMPARISON:  Breast MRI 09/15/2020 FINDINGS: Mediastinal blood pool activity: SUV max 2.1 Liver activity: SUV max 2.7 NECK:  No hypermetabolic lymph nodes in the neck. Incidental CT findings: none CHEST: Intense metabolic activity in the posterior lateral aspect of the LEFT breast near the skin surface with SUV max equal 3.9 (image 148). No hypermetabolic LEFT axillary lymph nodes. No hypermetabolic internal mammary lymph nodes or mediastinal lymph nodes. Mildly metabolic RIGHT axial lymph nodes have normal morphology there are favored related to recent COVID vaccination in RIGHT arm (SUV max equal 1.8 on image 189). Vaccination inflammation noted in the adjacent musculature. Incidental CT findings: none ABDOMEN/PELVIS: No abnormal hypermetabolic activity within the liver, pancreas, adrenal glands, or spleen. No hypermetabolic lymph nodes in the abdomen or pelvis. Incidental CT findings: Low-attenuation within liver suggests mild hepatic steatosis. Multiple diverticula of the sigmoid colon without acute inflammation. SKELETON: No focal hypermetabolic activity to suggest skeletal metastasis. Incidental CT findings: none IMPRESSION: 1. Hypermetabolic lesion in the lateral LEFT breast consistent primary breast carcinoma. 2. No evidence of local LEFT axillary nodal metastasis. 3. No evidence of central thoracic nodal metastasis. 4. No evidence distant metastatic disease. 5. Mild metabolic activity of RIGHT axial lymph nodes related to COVID vaccination. 6. Incidental findings of mild hepatic steatosis and diverticulosis. Electronically Signed   By: Suzy Bouchard M.D.   On: 09/29/2020 17:18   US BREAST LTD UNI LEFT INC AXILLA  Result Date: 09/08/2020 CLINICAL DATA:  Patient complains of an ulcerated palpable mass in the left breast. The  patient states the mass has been present for approximately 1 year. EXAM: DIGITAL DIAGNOSTIC BILATERAL MAMMOGRAM WITH CAD AND TOMO ULTRASOUND LEFT BREAST COMPARISON:  Previous exam(s). ACR Breast Density Category  choose FINDINGS: No suspicious mass or malignant type microcalcifications identified in the right breast. In the upper-outer quadrant of the left breast there is a spiculated mass with malignant type microcalcifications spanning an area of 5.4 cm. Abnormal lymph nodes are seen in the left axilla. Mammographic images were processed with CAD. On physical exam, there is a palpable ulcerated mass in the 1 o'clock region of the left breast 3 cm from the nipple. Targeted ultrasound is performed, showing contiguous hypoechoic interconnected masses in the left breast from 10:00 to 2:00 spanning at least an area of at least 4.8 cm. Sonographic evaluation of the left axilla shows 5 abnormal lymph nodes with cortical thickening. IMPRESSION: Suspicious interconnected masses in the left breast spanning from 10:00 to 2:00 and abnormal axillary adenopathy. RECOMMENDATION: Ultrasound-guided core biopsies of the medial and lateral extent of the masses (10 o'clock and 2 o'clock) in the left breast as well as a left axillary lymph node is recommended. I have discussed the findings and recommendations with the patient. If applicable, a reminder letter will be sent to the patient regarding the next appointment. BI-RADS CATEGORY  5: Highly suggestive of malignancy. Electronically Signed   By: Lillia Mountain M.D.   On: 09/08/2020 15:48   MM DIAG BREAST TOMO BILATERAL  Result Date: 09/08/2020 CLINICAL DATA:  Patient complains of an ulcerated palpable mass in the left breast. The patient states the mass has been present for approximately 1 year. EXAM: DIGITAL DIAGNOSTIC BILATERAL MAMMOGRAM WITH CAD AND TOMO ULTRASOUND LEFT BREAST COMPARISON:  Previous exam(s). ACR Breast Density Category  choose FINDINGS: No suspicious mass or  malignant type microcalcifications identified in the right breast. In the upper-outer quadrant of the left breast there is a spiculated mass with malignant type microcalcifications spanning an area of 5.4 cm. Abnormal lymph nodes are seen in the left axilla. Mammographic images were processed with CAD. On  physical exam, there is a palpable ulcerated mass in the 1 o'clock region of the left breast 3 cm from the nipple. Targeted ultrasound is performed, showing contiguous hypoechoic interconnected masses in the left breast from 10:00 to 2:00 spanning at least an area of at least 4.8 cm. Sonographic evaluation of the left axilla shows 5 abnormal lymph nodes with cortical thickening. IMPRESSION: Suspicious interconnected masses in the left breast spanning from 10:00 to 2:00 and abnormal axillary adenopathy. RECOMMENDATION: Ultrasound-guided core biopsies of the medial and lateral extent of the masses (10 o'clock and 2 o'clock) in the left breast as well as a left axillary lymph node is recommended. I have discussed the findings and recommendations with the patient. If applicable, a reminder letter will be sent to the patient regarding the next appointment. BI-RADS CATEGORY  5: Highly suggestive of malignancy. Electronically Signed   By: Lillia Mountain M.D.   On: 09/08/2020 15:48   ECHOCARDIOGRAM COMPLETE  Result Date: 10/05/2020    ECHOCARDIOGRAM REPORT   Patient Name:   JHADE BERKO Date of Exam: 10/05/2020 Medical Rec #:  [id number]           Height:       61.0 in Accession #:    [id number]          Weight:       120.0 lb Date of Birth:  [birth year]           BSA:          1.520 m Patient Age:    49 years            BP:           139/92 mmHg Patient Gender: F                   HR:           80 bpm. Exam Location:  [facility] Procedure: 2D Echo, Cardiac Doppler, Color Doppler and Strain Analysis Indications:     Chemotherapy evaluation V87.41  History:         Patient has no prior history of Echocardiogram  examinations.                  Anxiety, breast cancer.  Sonographer:     Sherrie Sport RDCS (AE) Referring Phys:  [id number] Weston Anna Hayslee Casebolt Diagnosing Phys: Nelva Bush MD  Sonographer Comments: Global longitudinal strain was attempted. IMPRESSIONS  1. Left ventricular ejection fraction, by estimation, is 60 to 65%. The left ventricle has normal function. The left ventricle has no regional wall motion abnormalities. Left ventricular diastolic parameters were normal. The average left ventricular global longitudinal strain is -17.8 %. The global longitudinal strain is normal.  2. Right ventricular systolic function is normal. The right ventricular size is normal. There is normal pulmonary artery systolic pressure.  3. The mitral valve is normal in structure. Trivial mitral valve regurgitation. No evidence of mitral stenosis.  4. The aortic valve is tricuspid. Aortic valve regurgitation is not visualized. No aortic stenosis is present.  5. The inferior vena cava is normal in size with greater than 50% respiratory variability, suggesting right atrial pressure of 3 mmHg. FINDINGS  Left Ventricle: Left ventricular ejection fraction, by estimation, is 60 to 65%. The left ventricle has normal function. The left ventricle has no regional wall motion abnormalities. The average left ventricular global longitudinal strain is -17.8 %. The global longitudinal strain is normal. The left ventricular internal cavity size was normal in  size. There is no left ventricular hypertrophy. Left ventricular diastolic parameters were normal. Right Ventricle: The right ventricular size is normal. No increase in right ventricular wall thickness. Right ventricular systolic function is normal. There is normal pulmonary artery systolic pressure. The tricuspid regurgitant velocity is 2.16 m/s, and  with an assumed right atrial pressure of 3 mmHg, the estimated right ventricular systolic pressure is 13.2 mmHg. Left Atrium: Left atrial size was normal  in size. Right Atrium: Right atrial size was normal in size. Pericardium: There is no evidence of pericardial effusion. Mitral Valve: The mitral valve is normal in structure. Trivial mitral valve regurgitation. No evidence of mitral valve stenosis. Tricuspid Valve: The tricuspid valve is normal in structure. Tricuspid valve regurgitation is trivial. Aortic Valve: The aortic valve is tricuspid. Aortic valve regurgitation is not visualized. No aortic stenosis is present. Aortic valve mean gradient measures 3.0 mmHg. Aortic valve peak gradient measures 6.0 mmHg. Aortic valve area, by VTI measures 2.08 cm. Pulmonic Valve: The pulmonic valve was grossly normal. Pulmonic valve regurgitation is trivial. No evidence of pulmonic stenosis. Aorta: The aortic root is normal in size and structure. Pulmonary Artery: The pulmonary artery is of normal size. Venous: The inferior vena cava is normal in size with greater than 50% respiratory variability, suggesting right atrial pressure of 3 mmHg. IAS/Shunts: No atrial level shunt detected by color flow Doppler.  LEFT VENTRICLE PLAX 2D LVIDd:         4.32 cm     Diastology LVIDs:         2.68 cm     LV e' medial:    8.59 cm/s LV PW:         0.79 cm     LV E/e' medial:  8.7 LV IVS:        0.73 cm     LV e' lateral:   8.59 cm/s LVOT diam:     2.00 cm     LV E/e' lateral: 8.7 LV SV:         46 LV SV Index:   30          2D Longitudinal Strain LVOT Area:     3.14 cm    2D Strain GLS Avg:     -17.8 %  LV Volumes (MOD) LV vol d, MOD A2C: 66.4 ml 3D Volume EF: LV vol d, MOD A4C: 66.8 ml 3D EF:        84 % LV vol s, MOD A2C: 30.5 ml LV EDV:       238 ml LV vol s, MOD A4C: 23.3 ml LV ESV:       39 ml LV SV MOD A2C:     35.9 ml LV SV:        199 ml LV SV MOD A4C:     66.8 ml LV SV MOD BP:      41.2 ml RIGHT VENTRICLE RV Basal diam:  3.01 cm RV S prime:     14.50 cm/s TAPSE (M-mode): 2.8 cm LEFT ATRIUM             Index       RIGHT ATRIUM           Index LA diam:        2.70 cm 1.78 cm/m  RA  Area:     10.10 cm LA Vol (A2C):   30.6 ml 20.13 ml/m RA Volume:   20.70 ml  13.62 ml/m LA Vol (A4C):   22.5  ml 14.80 ml/m LA Biplane Vol: 27.2 ml 17.89 ml/m  AORTIC VALVE                   PULMONIC VALVE AV Area (Vmax):    2.04 cm    PV Vmax:        0.60 m/s AV Area (Vmean):   1.76 cm    PV Peak grad:   1.4 mmHg AV Area (VTI):     2.08 cm    RVOT Peak grad: 3 mmHg AV Vmax:           122.00 cm/s AV Vmean:          86.200 cm/s AV VTI:            0.221 m AV Peak Grad:      6.0 mmHg AV Mean Grad:      3.0 mmHg LVOT Vmax:         79.30 cm/s LVOT Vmean:        48.400 cm/s LVOT VTI:          0.147 m LVOT/AV VTI ratio: 0.66  AORTA Ao Root diam: 2.70 cm MITRAL VALVE               TRICUSPID VALVE MV Area (PHT): 3.40 cm    TR Peak grad:   18.7 mmHg MV Decel Time: 223 msec    TR Vmax:        216.00 cm/s MV E velocity: 74.60 cm/s MV A velocity: 84.00 cm/s  SHUNTS MV E/A ratio:  0.89        Systemic VTI:  0.15 m                            Systemic Diam: 2.00 cm Nelva Bush MD Electronically signed by Nelva Bush MD Signature Date/Time: 10/05/2020/12:42:09 PM    Final    MM CLIP PLACEMENT LEFT  Result Date: 09/15/2020 CLINICAL DATA:  Status post LEFT breast mass and lymph node biopsy EXAM: DIAGNOSTIC LEFT MAMMOGRAM POST ULTRASOUND BIOPSY COMPARISON:  Previous exam(s). FINDINGS: Site 1: Ulcerating LEFT breast mass. Mammographic images were obtained following ultrasound guided biopsy of a irregular LEFT outer breast mass. The Q biopsy marking clip is in expected position at the site of biopsy. Site 2: LEFT axillary lymph node Mammographic images were obtained following ultrasound guided biopsy of a LEFT axillary lymph node without a fatty hila. [facility] biopsy marking clip is in expected position at the site of biopsy. IMPRESSION: 1. Appropriate positioning of the Q shaped biopsy marking clip at the site of biopsy in the outer breast. 2. Appropriate positioning of the Gi Or Norman shaped biopsy marking clip  at the site of biopsy in the axilla. Final Assessment: Post Procedure Mammograms for Marker Placement Electronically Signed   By: Valentino Saxon MD   On: 09/15/2020 14:27   [place] LT BREAST BX W LOC DEV 1ST LESION IMG BX SPEC US GUIDE  Addendum Date: 09/21/2020   ADDENDUM REPORT: 09/21/2020 12:41 ADDENDUM: PATHOLOGY revealed: Site A. LEFT BREAST, 2:00 5 CMFN; ULTRASOUND-GUIDED BIOPSY: - INVASIVE MAMMARY CARCINOMA, NO SPECIAL TYPE. 10 mm in this sample. Grade 2. Ductal carcinoma in situ: Present, with comedonecrosis. Lymphovascular invasion: Not identified. Pathology results are CONCORDANT with imaging findings, per Dr. Valentino Saxon. PATHOLOGY revealed: Site B. LYMPH NODE, LEFT AXILLARY; ULTRASOUND-GUIDED BIOPSY: - METASTATIC MAMMARY CARCINOMA, AS DESCRIBED ABOVE. Comment: Definitive lymph node tissue is not identified in this sample. Pathology results  are CONCORDANT with imaging findings, per Dr. Valentino Saxon. Pathology results and recommendations below were discussed with patient by telephone on 09/20/2020. Patient reported biopsy site within normal limits with slight tenderness at the site. Post biopsy care instructions were reviewed, questions were answered and my direct phone number was provided to patient. Patient was instructed to call [facility] if any concerns or questions arise related to the biopsy. Recommendation: 1. Surgical and oncological consultation: Request for surgical and oncological consultation relayed to Al Pimple RN and Tanya Nones RN at [facility] by Electa Sniff RN on 09/20/2020. 2. Consider bilateral breast MRI with and without contrast to evaluate extent of breast disease. Pathology results reported by Electa Sniff RN on 09/21/2020. Electronically Signed   By: Valentino Saxon MD   On: 09/21/2020 12:41   Result Date: 09/21/2020 CLINICAL DATA:  Ulcerating LEFT breast mass and abnormal LEFT axillary lymph nodes EXAM: ULTRASOUND GUIDED  LEFT BREAST CORE NEEDLE BIOPSY x 2 COMPARISON:  Previous exam(s). PROCEDURE: I met with the patient and we discussed the procedure of ultrasound-guided biopsy, including benefits and alternatives. We discussed the high likelihood of a successful procedure. We discussed the risks of the procedure, including infection, bleeding, tissue injury, clip migration, and inadequate sampling. Informed written consent was given. The usual time-out protocol was performed immediately prior to the procedure. Site 1: Ulcerating LEFT breast mass Lesion quadrant: Upper outer quadrant Using sterile technique and 1% lidocaine and 1% lidocaine with epinephrine as local anesthetic, under direct ultrasound visualization, a 14 gauge spring-loaded device was used to perform biopsy of a mass using a a lateral approach. At the conclusion of the procedure a Q tissue marker clip was deployed into the biopsy cavity. Follow up 2 view mammogram was performed and dictated separately. Site 2: Lymph node with likely associated calcifications in the axilla Lesion quadrant: Upper outer quadrant Using sterile technique and 1% lidocaine and 1% lidocaine with epinephrine as local anesthetic, under direct ultrasound visualization, a 14 gauge spring-loaded device was used to perform biopsy of a replaced lymph node using an inferolateral approach. At the conclusion of the procedure a HYDROMARK tissue marker clip was deployed into the biopsy cavity. Follow up 2 view mammogram was performed and dictated separately. IMPRESSION: Ultrasound guided biopsy of a LEFT breast mass and abnormal LEFT axillary lymph node with likely associated calcifications. No apparent complications. Electronically Signed: By: Valentino Saxon MD On: 09/15/2020 14:30   [place] LT BREAST BX W LOC DEV EA ADD LESION IMG BX SPEC US GUIDE  Addendum Date: 09/21/2020   ADDENDUM REPORT: 09/21/2020 12:41 ADDENDUM: PATHOLOGY revealed: Site A. LEFT BREAST, 2:00 5 CMFN; ULTRASOUND-GUIDED BIOPSY:  - INVASIVE MAMMARY CARCINOMA, NO SPECIAL TYPE. 10 mm in this sample. Grade 2. Ductal carcinoma in situ: Present, with comedonecrosis. Lymphovascular invasion: Not identified. Pathology results are CONCORDANT with imaging findings, per Dr. Valentino Saxon. PATHOLOGY revealed: Site B. LYMPH NODE, LEFT AXILLARY; ULTRASOUND-GUIDED BIOPSY: - METASTATIC MAMMARY CARCINOMA, AS DESCRIBED ABOVE. Comment: Definitive lymph node tissue is not identified in this sample. Pathology results are CONCORDANT with imaging findings, per Dr. Valentino Saxon. Pathology results and recommendations below were discussed with patient by telephone on 09/20/2020. Patient reported biopsy site within normal limits with slight tenderness at the site. Post biopsy care instructions were reviewed, questions were answered and my direct phone number was provided to patient. Patient was instructed to call [facility] if any concerns or questions arise related to the biopsy. Recommendation: 1. Surgical  and oncological consultation: Request for surgical and oncological consultation relayed to Al Pimple RN and Tanya Nones RN at [facility] by Electa Sniff RN on 09/20/2020. 2. Consider bilateral breast MRI with and without contrast to evaluate extent of breast disease. Pathology results reported by Electa Sniff RN on 09/21/2020. Electronically Signed   By: Valentino Saxon MD   On: 09/21/2020 12:41   Result Date: 09/21/2020 CLINICAL DATA:  Ulcerating LEFT breast mass and abnormal LEFT axillary lymph nodes EXAM: ULTRASOUND GUIDED LEFT BREAST CORE NEEDLE BIOPSY x 2 COMPARISON:  Previous exam(s). PROCEDURE: I met with the patient and we discussed the procedure of ultrasound-guided biopsy, including benefits and alternatives. We discussed the high likelihood of a successful procedure. We discussed the risks of the procedure, including infection, bleeding, tissue injury, clip migration, and inadequate sampling. Informed  written consent was given. The usual time-out protocol was performed immediately prior to the procedure. Site 1: Ulcerating LEFT breast mass Lesion quadrant: Upper outer quadrant Using sterile technique and 1% lidocaine and 1% lidocaine with epinephrine as local anesthetic, under direct ultrasound visualization, a 14 gauge spring-loaded device was used to perform biopsy of a mass using a a lateral approach. At the conclusion of the procedure a Q tissue marker clip was deployed into the biopsy cavity. Follow up 2 view mammogram was performed and dictated separately. Site 2: Lymph node with likely associated calcifications in the axilla Lesion quadrant: Upper outer quadrant Using sterile technique and 1% lidocaine and 1% lidocaine with epinephrine as local anesthetic, under direct ultrasound visualization, a 14 gauge spring-loaded device was used to perform biopsy of a replaced lymph node using an inferolateral approach. At the conclusion of the procedure a HYDROMARK tissue marker clip was deployed into the biopsy cavity. Follow up 2 view mammogram was performed and dictated separately. IMPRESSION: Ultrasound guided biopsy of a LEFT breast mass and abnormal LEFT axillary lymph node with likely associated calcifications. No apparent complications. Electronically Signed: By: Valentino Saxon MD On: 09/15/2020 14:30   IR IMAGING GUIDED PORT INSERTION  Result Date: 10/01/2020 CLINICAL DATA:  Left breast carcinoma and need for porta cath to begin chemotherapy. EXAM: IMPLANTED PORT A CATH PLACEMENT WITH ULTRASOUND AND FLUOROSCOPIC GUIDANCE ANESTHESIA/SEDATION: 3.0 mg IV Versed; 100 mcg IV Fentanyl Total Moderate Sedation Time:  34 minutes The patient's level of consciousness and physiologic status were continuously monitored during the procedure by Radiology nursing. Additional Medications: 2 g IV Ancef. FLUOROSCOPY TIME:  48 seconds.  4.0 mGy. PROCEDURE: The procedure, risks, benefits, and alternatives were  explained to the patient. Questions regarding the procedure were encouraged and answered. The patient understands and consents to the procedure. A time-out was performed prior to initiating the procedure. Ultrasound was utilized to confirm patency of the right internal jugular vein. The right neck and chest were prepped with chlorhexidine in a sterile fashion, and a sterile drape was applied covering the operative field. Maximum barrier sterile technique with sterile gowns and gloves were used for the procedure. Local anesthesia was provided with 1% lidocaine. After creating a small venotomy incision, a 21 gauge needle was advanced into the right internal jugular vein under direct, real-time ultrasound guidance. Ultrasound image documentation was performed. After securing guidewire access, an 8 Fr dilator was placed. A J-wire was kinked to measure appropriate catheter length. A subcutaneous port pocket was then created along the upper chest wall utilizing sharp and blunt dissection. Portable cautery was utilized. The pocket was irrigated with sterile saline. A single  lumen power injectable port was chosen for placement. The 8 Fr catheter was tunneled from the port pocket site to the venotomy incision. The port was placed in the pocket. External catheter was trimmed to appropriate length based on guidewire measurement. At the venotomy, an 8 Fr peel-away sheath was placed over a guidewire. The catheter was then placed through the sheath and the sheath removed. Final catheter positioning was confirmed and documented with a fluoroscopic spot image. The port was accessed with a needle and aspirated and flushed with heparinized saline. The access needle was removed. The venotomy and port pocket incisions were closed with subcutaneous 3-0 Monocryl and subcuticular 4-0 Vicryl. Dermabond was applied to both incisions. COMPLICATIONS: COMPLICATIONS None FINDINGS: After catheter placement, the tip lies at the cavo-atrial  junction. The catheter aspirates normally and is ready for immediate use. IMPRESSION: Placement of single lumen port a cath via right internal jugular vein. The catheter tip lies at the cavo-atrial junction. A power injectable port a cath was placed and is ready for immediate use. Electronically Signed   By: Aletta Edouard M.D.   On: 10/01/2020 16:38     Assessment and plan- Patient is a 49 y.o. 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  She is here for on treatment assessment prior to cycle 1 of neoadjuvant dose dense AC chemotherapy  I have reviewed PET/CT scan images independently and discussed findings with the patient which does not show any evidence of distant metastatic disease.  Baseline echocardiogram is normal.  She can therefore proceed with neoadjuvant dose dense AC chemotherapy today with on pro-Neulasta support.  I will see her back in 2 weeks with CBC with differential and CMP for cycle 2.  Discussed risks and benefits of chemotherapy including all but not limited to nausea, vomiting, low blood counts, risk of infections and hospitalization.  Risk of cardiotoxicity associated with anthracyclines.  Treatment will be given with a curative intent.  Patient understands and agrees to proceed as planned.  She does have her MRI scheduled within the next 2 weeks as well.  Discussed possibility of getting Neulasta associated pain and recommend taking Tylenol and as needed Claritin for the same   Visit Diagnosis 1. Goals of care, counseling/discussion   2. Malignant neoplasm of upper-outer quadrant of left breast in female, estrogen receptor positive ([place])      Dr. Randa Evens, MD, MPH [facility] [phone] 10/08/2020 12:45 PM

## 2020-10-08 NOTE — Progress Notes (Signed)
Pt received prescribed treatment in clinic, pt stable at d/c. 

## 2020-10-08 NOTE — Assessment & Plan Note (Addendum)
Reviewed past medical history.

## 2020-10-08 NOTE — Assessment & Plan Note (Signed)
Uncontrolled. Start cymbalta for additional benefit due to h/o low back pain and titrate to 60mg . Close follow up.

## 2020-10-08 NOTE — Assessment & Plan Note (Signed)
Chronic. Lumbar xray shows severe DDD, scoliosis. Advised orthopedic consult. Trial of cymbalta.

## 2020-10-09 LAB — HEPATITIS B SURFACE ANTIBODY, QUANTITATIVE: Hep B S AB Quant (Post): 6.3 m[IU]/mL — ABNORMAL LOW (ref >9.9)

## 2020-10-11 ENCOUNTER — Telehealth: Payer: Self-pay

## 2020-10-11 ENCOUNTER — Inpatient Hospital Stay: Payer: 59

## 2020-10-11 ENCOUNTER — Inpatient Hospital Stay (HOSPITAL_BASED_OUTPATIENT_CLINIC_OR_DEPARTMENT_OTHER): Payer: 59 | Admitting: Oncology

## 2020-10-11 ENCOUNTER — Other Ambulatory Visit: Payer: Self-pay

## 2020-10-11 VITALS — BP 115/72 | HR 68 | Temp 98.7°F | Resp 18

## 2020-10-11 DIAGNOSIS — C50412 Malignant neoplasm of upper-outer quadrant of left female breast: Secondary | ICD-10-CM

## 2020-10-11 DIAGNOSIS — Z5111 Encounter for antineoplastic chemotherapy: Secondary | ICD-10-CM | POA: Diagnosis not present

## 2020-10-11 DIAGNOSIS — Z170 Estrogen receptor positive status [ER+]: Secondary | ICD-10-CM | POA: Diagnosis not present

## 2020-10-11 DIAGNOSIS — Z79899 Other long term (current) drug therapy: Secondary | ICD-10-CM | POA: Diagnosis not present

## 2020-10-11 DIAGNOSIS — R110 Nausea: Secondary | ICD-10-CM | POA: Diagnosis not present

## 2020-10-11 DIAGNOSIS — F419 Anxiety disorder, unspecified: Secondary | ICD-10-CM | POA: Diagnosis not present

## 2020-10-11 DIAGNOSIS — C3490 Malignant neoplasm of unspecified part of unspecified bronchus or lung: Secondary | ICD-10-CM | POA: Diagnosis not present

## 2020-10-11 DIAGNOSIS — L98499 Non-pressure chronic ulcer of skin of other sites with unspecified severity: Secondary | ICD-10-CM | POA: Diagnosis not present

## 2020-10-11 DIAGNOSIS — G893 Neoplasm related pain (acute) (chronic): Secondary | ICD-10-CM | POA: Diagnosis not present

## 2020-10-11 LAB — CBC WITH DIFFERENTIAL/PLATELET
Abs Immature Granulocytes: 1.4 10*3/uL — ABNORMAL HIGH (ref 0.00–0.07)
Basophils Absolute: 0.1 10*3/uL (ref 0.0–0.1)
Basophils Relative: 1 %
Eosinophils Absolute: 0 10*3/uL (ref 0.0–0.5)
Eosinophils Relative: 0 %
HCT: 38.1 % (ref 36.0–46.0)
Hemoglobin: 13.2 g/dL (ref 12.0–15.0)
Immature Granulocytes: 8 %
Lymphocytes Relative: 10 %
Lymphs Abs: 1.7 10*3/uL (ref 0.7–4.0)
MCH: 31.4 pg (ref 26.0–34.0)
MCHC: 34.6 g/dL (ref 30.0–36.0)
MCV: 90.5 fL (ref 80.0–100.0)
Monocytes Absolute: 0.4 10*3/uL (ref 0.1–1.0)
Monocytes Relative: 2 %
Neutro Abs: 13.2 10*3/uL — ABNORMAL HIGH (ref 1.7–7.7)
Neutrophils Relative %: 79 %
Platelets: 204 10*3/uL (ref 150–400)
RBC: 4.21 MIL/uL (ref 3.87–5.11)
RDW: 12.9 % (ref 11.5–15.5)
Smear Review: NORMAL
WBC: 16.8 10*3/uL — ABNORMAL HIGH (ref 4.0–10.5)
nRBC: 0 % (ref 0.0–0.2)

## 2020-10-11 LAB — COMPREHENSIVE METABOLIC PANEL
ALT: 44 U/L (ref 0–44)
AST: 24 U/L (ref 15–41)
Albumin: 4.1 g/dL (ref 3.5–5.0)
Alkaline Phosphatase: 66 U/L (ref 38–126)
Anion gap: 10 (ref 5–15)
BUN: 16 mg/dL (ref 6–20)
CO2: 26 mmol/L (ref 22–32)
Calcium: 9.1 mg/dL (ref 8.9–10.3)
Chloride: 100 mmol/L (ref 98–111)
Creatinine, Ser: 0.7 mg/dL (ref 0.44–1.00)
GFR, Estimated: >60 mL/min (ref >60)
Glucose, Bld: 96 mg/dL (ref 70–99)
Potassium: 3.6 mmol/L (ref 3.5–5.1)
Sodium: 136 mmol/L (ref 135–145)
Total Bilirubin: 0.7 mg/dL (ref 0.3–1.2)
Total Protein: 7.5 g/dL (ref 6.5–8.1)

## 2020-10-11 LAB — MAGNESIUM: Magnesium: 2.3 mg/dL (ref 1.7–2.4)

## 2020-10-11 MED ORDER — HEPARIN SOD (PORK) LOCK FLUSH 100 UNIT/ML IV SOLN
500.0000 [IU] | Freq: Once | INTRAVENOUS | Status: AC
  Administered 2020-10-11: 500 [IU] via INTRAVENOUS
  Filled 2020-10-11: qty 5

## 2020-10-11 MED ORDER — HEPARIN SOD (PORK) LOCK FLUSH 100 UNIT/ML IV SOLN
INTRAVENOUS | Status: AC
  Filled 2020-10-11: qty 5

## 2020-10-11 MED ORDER — ONDANSETRON HCL 4 MG/2ML IJ SOLN
8.0000 mg | Freq: Once | INTRAMUSCULAR | Status: AC
  Administered 2020-10-11: 8 mg via INTRAVENOUS
  Filled 2020-10-11: qty 4

## 2020-10-11 MED ORDER — NICOTINE 21 MG/24HR TD PT24: 21.0000 mg | MEDICATED_PATCH | Freq: Every day | TRANSDERMAL | 0 refills | Status: DC

## 2020-10-11 MED ORDER — SODIUM CHLORIDE 0.9 % IV SOLN
Freq: Once | INTRAVENOUS | Status: AC
  Filled 2020-10-11: qty 250

## 2020-10-11 NOTE — Progress Notes (Signed)
Patient here for smoking cessation appointment.

## 2020-10-11 NOTE — Progress Notes (Signed)
Symptom Management Consult note [facility]  [phone] [phone]  Patient Care Team: Burnard Hawthorne, FNP as PCP - General (Family Medicine)   Name of the patient: Tricia Berry  [id number]  02-14-1971   Date of visit: 10/11/2020   Diagnosis- 1. Malignant neoplasm of upper-outer quadrant of left breast in female, estrogen receptor positive ([place]) - CBC with Differential/Platelet; Future - Comprehensive metabolic panel; Future - Magnesium; Future    Chief complaint/ Reason for visit- Dizziness and nausea  Heme/Onc history:  Oncology History  Malignant neoplasm of upper-outer quadrant of left breast in female, estrogen receptor positive ([place])  09/26/2020 Initial Diagnosis   Malignant neoplasm of upper-outer quadrant of left breast in female, estrogen receptor positive ([place])   10/08/2020 -  Chemotherapy   The patient had dexamethasone (DECADRON) 4 MG tablet, 8 mg, Oral, Daily, 1 of 1 cycle, Start date: 09/30/2020, End date: 10/06/2020 DOXOrubicin (ADRIAMYCIN) chemo injection 92 mg, 60 mg/m2 = 92 mg, Intravenous,  Once, 1 of 4 cycles Administration: 92 mg (10/08/2020) palonosetron (ALOXI) injection 0.25 mg, 0.25 mg, Intravenous,  Once, 1 of 4 cycles Administration: 0.25 mg (10/08/2020) pegfilgrastim (NEULASTA ONPRO KIT) injection 6 mg, 6 mg, Subcutaneous, Once, 1 of 4 cycles Administration: 6 mg (10/08/2020) cyclophosphamide (CYTOXAN) 1,000 mg in sodium chloride 0.9 % 250 mL chemo infusion, 653 mg/m2 = 920 mg, Intravenous,  Once, 1 of 4 cycles Administration: 1,000 mg (10/08/2020) PACLitaxel (TAXOL) 120 mg in sodium chloride 0.9 % 250 mL chemo infusion (</= 15m/m2), 80 mg/m2, Intravenous,  Once, 0 of 12 cycles fosaprepitant (EMEND) 150 mg in sodium chloride 0.9 % 145 mL IVPB, 150 mg, Intravenous,  Once, 1 of 4 cycles Administration: 150 mg (10/08/2020)  for chemotherapy treatment.    10/08/2020 Cancer Staging   Staging  form: Breast, AJCC 8th Edition - Clinical stage from 10/08/2020: Stage IIIB (cT4, cN1, cM0, G2, ER+, PR+, HER2-) - Signed by RSindy Guadeloupe MD on 10/08/2020      Interval history-Tricia Berry is a 49year old female with past medical history significant for internal hemorrhoids, IBS, osteoarthritis, anxiety, tobacco abuse, depression, insomnia and recent diagnosis of left breast cancer followed by Dr. RJanese Banks  She is status post 1 cycle of Adriamycin and Cytoxan given on 10/08/2020.  She presents today for smoking cessation but unfortunately is not feeling well.  Admits to feeling okay until about Sunday.  She states she had not had a bowel movement in every 3 days.  She took 2 doses of MiraLAX and had a very large bowel movement last night.  Since then she has felt nauseated and dizzy.  This morning she attempted to eat some improved but was unable to get it down.  She has taken a Zofran at 6 AM this morning.  Denies any neurologic complaints. Denies recent fevers or illnesses. Denies any easy bleeding or bruising. Reports good appetite and denies weight loss. Denies chest pain. Denies any nausea, vomiting, constipation, or diarrhea. Denies urinary complaints. Patient offers no further specific complaints today.   ECOG FS:1 - Symptomatic but completely ambulatory  Review of systems- Review of Systems  Constitutional: Positive for malaise/fatigue.  Gastrointestinal: Positive for constipation and nausea.  Neurological: Positive for dizziness.     Current treatment-status post 1 cycle of AC on 10/08/2020.  Allergies  Allergen Reactions  . Morphine Other (See Comments)    migranes  . Sertraline Hcl     REACTION: Worsened symptoms of IBS  . Sulfamethoxazole Rash  .  Sulfonamide Derivatives Rash     Past Medical History:  Diagnosis Date  . Anxiety   . Breast cancer ([place])   . Diverticulitis   . GERD (gastroesophageal reflux disease)   . IBS (irritable bowel syndrome)   . Scoliosis       Past Surgical History:  Procedure Laterality Date  . ABDOMINAL HYSTERECTOMY     still has ovaries, no gyn cancer, hysterectomy due to endometriosis.   Marland Kitchen BREAST BIOPSY Left 09/15/2020   [place] bx of mass, path pending, Q marker  . BREAST BIOPSY Left 09/15/2020   [place] bx of LN, hydromarker, path pending  . IR IMAGING GUIDED PORT INSERTION  10/01/2020    Social History   Socioeconomic History  . Marital status: Divorced    Spouse name: Not on file  . Number of children: 2  . Years of education: Not on file  . Highest education level: Not on file  Occupational History  . Occupation: Optician, dispensing:   Tobacco Use  . Smoking status: Current Every Day Smoker  . Smokeless tobacco: Never Used  Substance and Sexual Activity  . Alcohol use: Yes  . Drug use: No  . Sexual activity: Not Currently    Birth control/protection: None  Other Topics Concern  . 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 She and her spouse are primary caregivers.    Social Determinants of Health   Financial Resource Strain: Not on file  Food Insecurity: Not on file  Transportation Needs: Not on file  Physical Activity: Not on file  Stress: Not on file  Social Connections: Not on file  Intimate Partner Violence: Not on file    Family History  Problem Relation Age of Onset  . Hypertension Mother   . Osteoarthritis Mother   . Diverticulitis Mother   . Heart failure Father   . Hypertension Father   . Gout Father   . Diverticulitis Brother   . Lung cancer Maternal Uncle   . Lung cancer Maternal Uncle      Current Outpatient Medications:  .  acetaminophen (TYLENOL) 325 MG tablet, Take 650 mg by mouth every 6 (six) hours as needed., Disp: , Rfl:  .  cyclobenzaprine (FLEXERIL) 10 MG tablet, Take 1 tablet (10 mg total) by mouth 3 (three) times daily as needed for muscle spasms. (Patient not taking: No  sig reported), Disp: 30 tablet, Rfl: 0 .  DULoxetine (CYMBALTA) 30 MG capsule, Take one 30 mg tablet by mouth once a day for the first week. Then increase to two 30 mg tablets ( total 39m) by mouth once daily., Disp: 60 capsule, Rfl: 3 .  Lactobacillus (PROBIOTIC ACIDOPHILUS PO), Take 1 capsule by mouth daily., Disp: , Rfl:  .  lidocaine-prilocaine (EMLA) cream, Apply to affected area once (Patient not taking: No sig reported), Disp: 30 g, Rfl: 3 .  LORazepam (ATIVAN) 0.5 MG tablet, Take 1 tablet (0.5 mg total) by mouth every 6 (six) hours as needed (Nausea or vomiting)., Disp: 30 tablet, Rfl: 0 .  Multiple Vitamins-Minerals (MULTIVITAL PO), Take 1 Dose by mouth daily., Disp: , Rfl:  .  ondansetron (ZOFRAN) 8 MG tablet, Take 1 tablet (8 mg total) by mouth 2 (two) times daily as needed. Start on the third day after chemotherapy., Disp: 30 tablet, Rfl: 1 .  oxyCODONE (OXY IR/ROXICODONE) 5 MG immediate release tablet,  Take 1 tablet (5 mg total) by mouth every 6 (six) hours as needed for severe pain., Disp: 120 tablet, Rfl: 0 .  prochlorperazine (COMPAZINE) 10 MG tablet, Take 1 tablet (10 mg total) by mouth every 6 (six) hours as needed (Nausea or vomiting)., Disp: 30 tablet, Rfl: 1 No current facility-administered medications for this visit.  Facility-Administered Medications Ordered in Other Visits:  .  0.9 %  sodium chloride infusion, , Intravenous, Once, Earlie Server, MD, Last Rate: 999 mL/hr at 10/11/20 1221, New Bag at 10/11/20 1221  Physical exam: There were no vitals filed for this visit. Physical Exam Constitutional:      General: Vital signs are normal.     Appearance: Normal appearance.  HENT:     Head: Normocephalic and atraumatic.  Eyes:     Pupils: Pupils are equal, round, and reactive to light.  Cardiovascular:     Rate and Rhythm: Normal rate and regular rhythm.     Heart sounds: Normal heart sounds. No murmur heard.   Pulmonary:     Effort: Pulmonary effort is normal.      Breath sounds: Normal breath sounds. No wheezing.  Abdominal:     General: Bowel sounds are normal. There is no distension.     Palpations: Abdomen is soft.     Tenderness: There is no abdominal tenderness.  Musculoskeletal:        General: No edema. Normal range of motion.     Cervical back: Normal range of motion.  Skin:    General: Skin is warm and dry.     Findings: No rash.  Neurological:     Mental Status: She is alert and oriented to person, place, and time.  Psychiatric:        Judgment: Judgment normal.      CMP Latest Ref Rng & Units 10/11/2020  Glucose 70 - 99 mg/dL 96  BUN 6 - 20 mg/dL 16  Creatinine 0.44 - 1.00 mg/dL 0.70  Sodium 135 - 145 mmol/L 136  Potassium 3.5 - 5.1 mmol/L 3.6  Chloride 98 - 111 mmol/L 100  CO2 22 - 32 mmol/L 26  Calcium 8.9 - 10.3 mg/dL 9.1  Total Protein 6.5 - 8.1 g/dL 7.5  Total Bilirubin 0.3 - 1.2 mg/dL 0.7  Alkaline Phos 38 - 126 U/L 66  AST 15 - 41 U/L 24  ALT 0 - 44 U/L 44   CBC Latest Ref Rng & Units 10/11/2020  WBC 4.0 - 10.5 K/uL 16.8(H)  Hemoglobin 12.0 - 15.0 g/dL 13.2  Hematocrit 36.0 - 46.0 % 38.1  Platelets 150 - 400 K/uL 204    No images are attached to the encounter.  DG Lumbar Spine Complete  Result Date: 10/07/2020 CLINICAL DATA:  Low back pain.  History of scoliosis. EXAM: LUMBAR SPINE - COMPLETE 4+ VIEW COMPARISON:  PET CT 09/29/2020. FINDINGS: Severe lumbar spine scoliosis concave right. Diffuse multilevel degenerative change. No acute or focal bony abnormalities identified. Aortoiliac atherosclerotic vascular calcification. IMPRESSION: Severe lumbar spine scoliosis concave right. Diffuse multilevel degenerative change. No acute or focal bony abnormality identified. Electronically Signed   By: Marcello Moores  Register   On: 10/07/2020 13:23   NM PET Image Initial (PI) Skull Base To Thigh  Result Date: 09/29/2020 CLINICAL DATA:  Initial treatment strategy for LEFT breast carcinoma. COVID research shot RIGHT arm. EXAM:  NUCLEAR MEDICINE PET SKULL BASE TO THIGH TECHNIQUE: 6.5 mCi F-18 FDG was injected intravenously. Full-ring PET imaging was performed from the skull base to thigh  after the radiotracer. CT data was obtained and used for attenuation correction and anatomic localization. Fasting blood glucose: 95 mg/dl COMPARISON:  Breast MRI 09/15/2020 FINDINGS: Mediastinal blood pool activity: SUV max 2.1 Liver activity: SUV max 2.7 NECK: No hypermetabolic lymph nodes in the neck. Incidental CT findings: none CHEST: Intense metabolic activity in the posterior lateral aspect of the LEFT breast near the skin surface with SUV max equal 3.9 (image 148). No hypermetabolic LEFT axillary lymph nodes. No hypermetabolic internal mammary lymph nodes or mediastinal lymph nodes. Mildly metabolic RIGHT axial lymph nodes have normal morphology there are favored related to recent COVID vaccination in RIGHT arm (SUV max equal 1.8 on image 189). Vaccination inflammation noted in the adjacent musculature. Incidental CT findings: none ABDOMEN/PELVIS: No abnormal hypermetabolic activity within the liver, pancreas, adrenal glands, or spleen. No hypermetabolic lymph nodes in the abdomen or pelvis. Incidental CT findings: Low-attenuation within liver suggests mild hepatic steatosis. Multiple diverticula of the sigmoid colon without acute inflammation. SKELETON: No focal hypermetabolic activity to suggest skeletal metastasis. Incidental CT findings: none IMPRESSION: 1. Hypermetabolic lesion in the lateral LEFT breast consistent primary breast carcinoma. 2. No evidence of local LEFT axillary nodal metastasis. 3. No evidence of central thoracic nodal metastasis. 4. No evidence distant metastatic disease. 5. Mild metabolic activity of RIGHT axial lymph nodes related to COVID vaccination. 6. Incidental findings of mild hepatic steatosis and diverticulosis. Electronically Signed   By: Suzy Bouchard M.D.   On: 09/29/2020 17:18   ECHOCARDIOGRAM  COMPLETE  Result Date: 10/05/2020    ECHOCARDIOGRAM REPORT   Patient Name:   DANESSA MENSCH Date of Exam: 10/05/2020 Medical Rec #:  [id number]           Height:       61.0 in Accession #:    [id number]          Weight:       120.0 lb Date of Birth:  01-03-1971           BSA:          1.520 m Patient Age:    23 years            BP:           139/92 mmHg Patient Gender: F                   HR:           80 bpm. Exam Location:  [facility] Procedure: 2D Echo, Cardiac Doppler, Color Doppler and Strain Analysis Indications:     Chemotherapy evaluation V87.41  History:         Patient has no prior history of Echocardiogram examinations.                  Anxiety, breast cancer.  Sonographer:     Sherrie Sport RDCS (AE) Referring Phys:  [id number] Weston Anna RAO Diagnosing Phys: Nelva Bush MD  Sonographer Comments: Global longitudinal strain was attempted. IMPRESSIONS  1. Left ventricular ejection fraction, by estimation, is 60 to 65%. The left ventricle has normal function. The left ventricle has no regional wall motion abnormalities. Left ventricular diastolic parameters were normal. The average left ventricular global longitudinal strain is -17.8 %. The global longitudinal strain is normal.  2. Right ventricular systolic function is normal. The right ventricular size is normal. There is normal pulmonary artery systolic pressure.  3. The mitral valve is normal in structure. Trivial mitral valve regurgitation. No evidence of mitral stenosis.  4. The aortic valve is tricuspid. Aortic valve regurgitation is not visualized. No aortic stenosis is present.  5. The inferior vena cava is normal in size with greater than 50% respiratory variability, suggesting right atrial pressure of 3 mmHg. FINDINGS  Left Ventricle: Left ventricular ejection fraction, by estimation, is 60 to 65%. The left ventricle has normal function. The left ventricle has no regional wall motion abnormalities. The average left ventricular global longitudinal  strain is -17.8 %. The global longitudinal strain is normal. The left ventricular internal cavity size was normal in size. There is no left ventricular hypertrophy. Left ventricular diastolic parameters were normal. Right Ventricle: The right ventricular size is normal. No increase in right ventricular wall thickness. Right ventricular systolic function is normal. There is normal pulmonary artery systolic pressure. The tricuspid regurgitant velocity is 2.16 m/s, and  with an assumed right atrial pressure of 3 mmHg, the estimated right ventricular systolic pressure is 41.6 mmHg. Left Atrium: Left atrial size was normal in size. Right Atrium: Right atrial size was normal in size. Pericardium: There is no evidence of pericardial effusion. Mitral Valve: The mitral valve is normal in structure. Trivial mitral valve regurgitation. No evidence of mitral valve stenosis. Tricuspid Valve: The tricuspid valve is normal in structure. Tricuspid valve regurgitation is trivial. Aortic Valve: The aortic valve is tricuspid. Aortic valve regurgitation is not visualized. No aortic stenosis is present. Aortic valve mean gradient measures 3.0 mmHg. Aortic valve peak gradient measures 6.0 mmHg. Aortic valve area, by VTI measures 2.08 cm. Pulmonic Valve: The pulmonic valve was grossly normal. Pulmonic valve regurgitation is trivial. No evidence of pulmonic stenosis. Aorta: The aortic root is normal in size and structure. Pulmonary Artery: The pulmonary artery is of normal size. Venous: The inferior vena cava is normal in size with greater than 50% respiratory variability, suggesting right atrial pressure of 3 mmHg. IAS/Shunts: No atrial level shunt detected by color flow Doppler.  LEFT VENTRICLE PLAX 2D LVIDd:         4.32 cm     Diastology LVIDs:         2.68 cm     LV e' medial:    8.59 cm/s LV PW:         0.79 cm     LV E/e' medial:  8.7 LV IVS:        0.73 cm     LV e' lateral:   8.59 cm/s LVOT diam:     2.00 cm     LV E/e' lateral:  8.7 LV SV:         46 LV SV Index:   30          2D Longitudinal Strain LVOT Area:     3.14 cm    2D Strain GLS Avg:     -17.8 %  LV Volumes (MOD) LV vol d, MOD A2C: 66.4 ml 3D Volume EF: LV vol d, MOD A4C: 66.8 ml 3D EF:        84 % LV vol s, MOD A2C: 30.5 ml LV EDV:       238 ml LV vol s, MOD A4C: 23.3 ml LV ESV:       39 ml LV SV MOD A2C:     35.9 ml LV SV:        199 ml LV SV MOD A4C:     66.8 ml LV SV MOD BP:      41.2 ml RIGHT VENTRICLE RV Basal diam:  3.01  cm RV S prime:     14.50 cm/s TAPSE (M-mode): 2.8 cm LEFT ATRIUM             Index       RIGHT ATRIUM           Index LA diam:        2.70 cm 1.78 cm/m  RA Area:     10.10 cm LA Vol (A2C):   30.6 ml 20.13 ml/m RA Volume:   20.70 ml  13.62 ml/m LA Vol (A4C):   22.5 ml 14.80 ml/m LA Biplane Vol: 27.2 ml 17.89 ml/m  AORTIC VALVE                   PULMONIC VALVE AV Area (Vmax):    2.04 cm    PV Vmax:        0.60 m/s AV Area (Vmean):   1.76 cm    PV Peak grad:   1.4 mmHg AV Area (VTI):     2.08 cm    RVOT Peak grad: 3 mmHg AV Vmax:           122.00 cm/s AV Vmean:          86.200 cm/s AV VTI:            0.221 m AV Peak Grad:      6.0 mmHg AV Mean Grad:      3.0 mmHg LVOT Vmax:         79.30 cm/s LVOT Vmean:        48.400 cm/s LVOT VTI:          0.147 m LVOT/AV VTI ratio: 0.66  AORTA Ao Root diam: 2.70 cm MITRAL VALVE               TRICUSPID VALVE MV Area (PHT): 3.40 cm    TR Peak grad:   18.7 mmHg MV Decel Time: 223 msec    TR Vmax:        216.00 cm/s MV E velocity: 74.60 cm/s MV A velocity: 84.00 cm/s  SHUNTS MV E/A ratio:  0.89        Systemic VTI:  0.15 m                            Systemic Diam: 2.00 cm Nelva Bush MD Electronically signed by Nelva Bush MD Signature Date/Time: 10/05/2020/12:42:09 PM    Final    MM CLIP PLACEMENT LEFT  Result Date: 09/15/2020 CLINICAL DATA:  Status post LEFT breast mass and lymph node biopsy EXAM: DIAGNOSTIC LEFT MAMMOGRAM POST ULTRASOUND BIOPSY COMPARISON:  Previous exam(s). FINDINGS: Site 1:  Ulcerating LEFT breast mass. Mammographic images were obtained following ultrasound guided biopsy of a irregular LEFT outer breast mass. The Q biopsy marking clip is in expected position at the site of biopsy. Site 2: LEFT axillary lymph node Mammographic images were obtained following ultrasound guided biopsy of a LEFT axillary lymph node without a fatty hila. [facility] biopsy marking clip is in expected position at the site of biopsy. IMPRESSION: 1. Appropriate positioning of the Q shaped biopsy marking clip at the site of biopsy in the outer breast. 2. Appropriate positioning of the [facility] shaped biopsy marking clip at the site of biopsy in the axilla. Final Assessment: Post Procedure Mammograms for Marker Placement Electronically Signed   By: Valentino Saxon MD   On: 09/15/2020 14:27   [place] LT BREAST BX W LOC DEV 1ST  LESION IMG BX SPEC US GUIDE  Addendum Date: 09/21/2020   ADDENDUM REPORT: 09/21/2020 12:41 ADDENDUM: PATHOLOGY revealed: Site A. LEFT BREAST, 2:00 5 CMFN; ULTRASOUND-GUIDED BIOPSY: - INVASIVE MAMMARY CARCINOMA, NO SPECIAL TYPE. 10 mm in this sample. Grade 2. Ductal carcinoma in situ: Present, with comedonecrosis. Lymphovascular invasion: Not identified. Pathology results are CONCORDANT with imaging findings, per Dr. Valentino Saxon. PATHOLOGY revealed: Site B. LYMPH NODE, LEFT AXILLARY; ULTRASOUND-GUIDED BIOPSY: - METASTATIC MAMMARY CARCINOMA, AS DESCRIBED ABOVE. Comment: Definitive lymph node tissue is not identified in this sample. Pathology results are CONCORDANT with imaging findings, per Dr. Valentino Saxon. Pathology results and recommendations below were discussed with patient by telephone on 09/20/2020. Patient reported biopsy site within normal limits with slight tenderness at the site. Post biopsy care instructions were reviewed, questions were answered and my direct phone number was provided to patient. Patient was instructed to call [facility] if any concerns  or questions arise related to the biopsy. Recommendation: 1. Surgical and oncological consultation: Request for surgical and oncological consultation relayed to Al Pimple RN and Tanya Nones RN at [facility] by Electa Sniff RN on 09/20/2020. 2. Consider bilateral breast MRI with and without contrast to evaluate extent of breast disease. Pathology results reported by Electa Sniff RN on 09/21/2020. Electronically Signed   By: Valentino Saxon MD   On: 09/21/2020 12:41   Result Date: 09/21/2020 CLINICAL DATA:  Ulcerating LEFT breast mass and abnormal LEFT axillary lymph nodes EXAM: ULTRASOUND GUIDED LEFT BREAST CORE NEEDLE BIOPSY x 2 COMPARISON:  Previous exam(s). PROCEDURE: I met with the patient and we discussed the procedure of ultrasound-guided biopsy, including benefits and alternatives. We discussed the high likelihood of a successful procedure. We discussed the risks of the procedure, including infection, bleeding, tissue injury, clip migration, and inadequate sampling. Informed written consent was given. The usual time-out protocol was performed immediately prior to the procedure. Site 1: Ulcerating LEFT breast mass Lesion quadrant: Upper outer quadrant Using sterile technique and 1% lidocaine and 1% lidocaine with epinephrine as local anesthetic, under direct ultrasound visualization, a 14 gauge spring-loaded device was used to perform biopsy of a mass using a a lateral approach. At the conclusion of the procedure a Q tissue marker clip was deployed into the biopsy cavity. Follow up 2 view mammogram was performed and dictated separately. Site 2: Lymph node with likely associated calcifications in the axilla Lesion quadrant: Upper outer quadrant Using sterile technique and 1% lidocaine and 1% lidocaine with epinephrine as local anesthetic, under direct ultrasound visualization, a 14 gauge spring-loaded device was used to perform biopsy of a replaced lymph node using an inferolateral  approach. At the conclusion of the procedure a HYDROMARK tissue marker clip was deployed into the biopsy cavity. Follow up 2 view mammogram was performed and dictated separately. IMPRESSION: Ultrasound guided biopsy of a LEFT breast mass and abnormal LEFT axillary lymph node with likely associated calcifications. No apparent complications. Electronically Signed: By: Valentino Saxon MD On: 09/15/2020 14:30   [place] LT BREAST BX W LOC DEV EA ADD LESION IMG BX SPEC US GUIDE  Addendum Date: 09/21/2020   ADDENDUM REPORT: 09/21/2020 12:41 ADDENDUM: PATHOLOGY revealed: Site A. LEFT BREAST, 2:00 5 CMFN; ULTRASOUND-GUIDED BIOPSY: - INVASIVE MAMMARY CARCINOMA, NO SPECIAL TYPE. 10 mm in this sample. Grade 2. Ductal carcinoma in situ: Present, with comedonecrosis. Lymphovascular invasion: Not identified. Pathology results are CONCORDANT with imaging findings, per Dr. Valentino Saxon. PATHOLOGY revealed: Site B. LYMPH NODE, LEFT AXILLARY; ULTRASOUND-GUIDED BIOPSY: -  METASTATIC MAMMARY CARCINOMA, AS DESCRIBED ABOVE. Comment: Definitive lymph node tissue is not identified in this sample. Pathology results are CONCORDANT with imaging findings, per Dr. Valentino Saxon. Pathology results and recommendations below were discussed with patient by telephone on 09/20/2020. Patient reported biopsy site within normal limits with slight tenderness at the site. Post biopsy care instructions were reviewed, questions were answered and my direct phone number was provided to patient. Patient was instructed to call [facility] if any concerns or questions arise related to the biopsy. Recommendation: 1. Surgical and oncological consultation: Request for surgical and oncological consultation relayed to Al Pimple RN and Tanya Nones RN at [facility] by Electa Sniff RN on 09/20/2020. 2. Consider bilateral breast MRI with and without contrast to evaluate extent of breast disease. Pathology results reported by  Electa Sniff RN on 09/21/2020. Electronically Signed   By: Valentino Saxon MD   On: 09/21/2020 12:41   Result Date: 09/21/2020 CLINICAL DATA:  Ulcerating LEFT breast mass and abnormal LEFT axillary lymph nodes EXAM: ULTRASOUND GUIDED LEFT BREAST CORE NEEDLE BIOPSY x 2 COMPARISON:  Previous exam(s). PROCEDURE: I met with the patient and we discussed the procedure of ultrasound-guided biopsy, including benefits and alternatives. We discussed the high likelihood of a successful procedure. We discussed the risks of the procedure, including infection, bleeding, tissue injury, clip migration, and inadequate sampling. Informed written consent was given. The usual time-out protocol was performed immediately prior to the procedure. Site 1: Ulcerating LEFT breast mass Lesion quadrant: Upper outer quadrant Using sterile technique and 1% lidocaine and 1% lidocaine with epinephrine as local anesthetic, under direct ultrasound visualization, a 14 gauge spring-loaded device was used to perform biopsy of a mass using a a lateral approach. At the conclusion of the procedure a Q tissue marker clip was deployed into the biopsy cavity. Follow up 2 view mammogram was performed and dictated separately. Site 2: Lymph node with likely associated calcifications in the axilla Lesion quadrant: Upper outer quadrant Using sterile technique and 1% lidocaine and 1% lidocaine with epinephrine as local anesthetic, under direct ultrasound visualization, a 14 gauge spring-loaded device was used to perform biopsy of a replaced lymph node using an inferolateral approach. At the conclusion of the procedure a HYDROMARK tissue marker clip was deployed into the biopsy cavity. Follow up 2 view mammogram was performed and dictated separately. IMPRESSION: Ultrasound guided biopsy of a LEFT breast mass and abnormal LEFT axillary lymph node with likely associated calcifications. No apparent complications. Electronically Signed: By: Valentino Saxon MD On:  09/15/2020 14:30   IR IMAGING GUIDED PORT INSERTION  Result Date: 10/01/2020 CLINICAL DATA:  Left breast carcinoma and need for porta cath to begin chemotherapy. EXAM: IMPLANTED PORT A CATH PLACEMENT WITH ULTRASOUND AND FLUOROSCOPIC GUIDANCE ANESTHESIA/SEDATION: 3.0 mg IV Versed; 100 mcg IV Fentanyl Total Moderate Sedation Time:  34 minutes The patient's level of consciousness and physiologic status were continuously monitored during the procedure by Radiology nursing. Additional Medications: 2 g IV Ancef. FLUOROSCOPY TIME:  48 seconds.  4.0 mGy. PROCEDURE: The procedure, risks, benefits, and alternatives were explained to the patient. Questions regarding the procedure were encouraged and answered. The patient understands and consents to the procedure. A time-out was performed prior to initiating the procedure. Ultrasound was utilized to confirm patency of the right internal jugular vein. The right neck and chest were prepped with chlorhexidine in a sterile fashion, and a sterile drape was applied covering the operative field. Maximum barrier sterile technique with  sterile gowns and gloves were used for the procedure. Local anesthesia was provided with 1% lidocaine. After creating a small venotomy incision, a 21 gauge needle was advanced into the right internal jugular vein under direct, real-time ultrasound guidance. Ultrasound image documentation was performed. After securing guidewire access, an 8 Fr dilator was placed. A J-wire was kinked to measure appropriate catheter length. A subcutaneous port pocket was then created along the upper chest wall utilizing sharp and blunt dissection. Portable cautery was utilized. The pocket was irrigated with sterile saline. A single lumen power injectable port was chosen for placement. The 8 Fr catheter was tunneled from the port pocket site to the venotomy incision. The port was placed in the pocket. External catheter was trimmed to appropriate length based on guidewire  measurement. At the venotomy, an 8 Fr peel-away sheath was placed over a guidewire. The catheter was then placed through the sheath and the sheath removed. Final catheter positioning was confirmed and documented with a fluoroscopic spot image. The port was accessed with a needle and aspirated and flushed with heparinized saline. The access needle was removed. The venotomy and port pocket incisions were closed with subcutaneous 3-0 Monocryl and subcuticular 4-0 Vicryl. Dermabond was applied to both incisions. COMPLICATIONS: COMPLICATIONS None FINDINGS: After catheter placement, the tip lies at the cavo-atrial junction. The catheter aspirates normally and is ready for immediate use. IMPRESSION: Placement of single lumen port a cath via right internal jugular vein. The catheter tip lies at the cavo-atrial junction. A power injectable port a cath was placed and is ready for immediate use. Electronically Signed   By: Aletta Edouard M.D.   On: 10/01/2020 16:38     Assessment and plan- Patient is a 49 y.o. female was recently diagnosed with left breast cancer.  She is followed by Dr. Janese Banks and received first cycle of [facility] on 10/08/2020.  Left breast cancer; ER/PR positive HER-2/neu negative: -Status post 1 cycle of AC. -PET scan showed hypermetabolism in area of left breast but no distant metastasis.  Intractable nausea: -Has tried Zofran with some relief. -Also has anorexia -Weight is down 5 pounds since beginning of the month.  Dizziness: -We will check blood pressure today to see if she is orthostatic.  -Stable blood pressure and heart rate. -We will check CBC, CMP and magnesium.  Constipation: -Tried MiraLAX with good bowel movement late last night early this morning.  Dehydration: -Secondary to change in appetite from chemo. -We will proceed with IV fluids today along with antiemetics. -Labs today were fairly stable.  Smoking cessation: -We discussed starting a nicotine patch 21 mg daily for  the next 2 to 3 weeks to see if we can decrease the amount of cigarettes she is smoking. -We will touch base at her next chemo appointment 10/21/2020 to see how she is tolerating. -Prescription called into [facility] employee pharmacy.  Disposition: -RTC as scheduled on 10/21/2020 for lab work, MD assessment and cycle 2. -We will touch base with her regarding smoking cessation at her next visit.   Visit Diagnosis 1. Malignant neoplasm of upper-outer quadrant of left breast in female, estrogen receptor positive ([place])     Patient expressed understanding and was in agreement with this plan. She also understands that She can call clinic at any time with any questions, concerns, or complaints.   Greater than 50% was spent in counseling and coordination of care with this patient including but not limited to discussion of the relevant topics above (See A&P) including, but  not limited to diagnosis and management of acute and chronic medical conditions.   Thank you for allowing me to participate in the care of this very pleasant patient.    Jacquelin Hawking, NP [place] [facility] Cell - [phone] Pager- [phone] 10/11/2020 12:50 PM

## 2020-10-11 NOTE — Telephone Encounter (Signed)
LMTCB for xray results.  °

## 2020-10-14 ENCOUNTER — Other Ambulatory Visit: Payer: Self-pay

## 2020-10-14 ENCOUNTER — Ambulatory Visit
Admission: RE | Admit: 2020-10-14 | Discharge: 2020-10-14 | Disposition: A | Discharge status: Home / Self Care | Payer: 59 | Source: Ambulatory Visit | Attending: Oncology | Admitting: Oncology

## 2020-10-14 DIAGNOSIS — C50412 Malignant neoplasm of upper-outer quadrant of left female breast: Secondary | ICD-10-CM | POA: Insufficient documentation

## 2020-10-14 DIAGNOSIS — Z170 Estrogen receptor positive status [ER+]: Secondary | ICD-10-CM | POA: Diagnosis not present

## 2020-10-14 MED ORDER — GADOBUTROL 1 MMOL/ML IV SOLN
5.0000 mL | Freq: Once | INTRAVENOUS | Status: AC | PRN
  Administered 2020-10-14: 5 mL via INTRAVENOUS

## 2020-10-18 ENCOUNTER — Telehealth: Payer: Self-pay | Admitting: *Deleted

## 2020-10-18 NOTE — Telephone Encounter (Signed)
I sent the medication and faxed it into her pharmacy. Pt aware of it and the instructions. She also says that the crusty area on her breast has fallen off. She now has a raw piece of skin and wants to know if she should cover it , put cream on it or don't use anything. She just wants advice. She says it is not draining. She said earlier in having the area they had tested it and it was some bacteria on the skin that was tested.

## 2020-10-18 NOTE — Telephone Encounter (Signed)
Patient called reporting that she is having difficulty eating due to mouth ulcers and would like for something to be done about it. Please advise

## 2020-10-18 NOTE — Telephone Encounter (Signed)
Can you send prescription for magic mouthwash?

## 2020-10-18 NOTE — Telephone Encounter (Signed)
Called to pt and left message on home phone- use telfa pad to cover the raw area and then paper tape over the telfa but making sure that you did not have take on any open area. If she has questions to call back. 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

## 2020-10-18 NOTE — Telephone Encounter (Signed)
Cover it with dry gauze. No cream

## 2020-10-20 DIAGNOSIS — Z170 Estrogen receptor positive status [ER+]: Secondary | ICD-10-CM | POA: Diagnosis not present

## 2020-10-20 DIAGNOSIS — C50412 Malignant neoplasm of upper-outer quadrant of left female breast: Secondary | ICD-10-CM | POA: Diagnosis not present

## 2020-10-21 ENCOUNTER — Inpatient Hospital Stay: Payer: 59

## 2020-10-21 ENCOUNTER — Other Ambulatory Visit: Payer: Self-pay | Admitting: *Deleted

## 2020-10-21 ENCOUNTER — Inpatient Hospital Stay (HOSPITAL_BASED_OUTPATIENT_CLINIC_OR_DEPARTMENT_OTHER): Payer: 59 | Admitting: Oncology

## 2020-10-21 VITALS — BP 122/87 | HR 85 | Temp 97.7°F | Wt 117.4 lb

## 2020-10-21 DIAGNOSIS — F419 Anxiety disorder, unspecified: Secondary | ICD-10-CM | POA: Diagnosis not present

## 2020-10-21 DIAGNOSIS — Z79899 Other long term (current) drug therapy: Secondary | ICD-10-CM | POA: Diagnosis not present

## 2020-10-21 DIAGNOSIS — Z5111 Encounter for antineoplastic chemotherapy: Secondary | ICD-10-CM

## 2020-10-21 DIAGNOSIS — R110 Nausea: Secondary | ICD-10-CM | POA: Diagnosis not present

## 2020-10-21 DIAGNOSIS — C50412 Malignant neoplasm of upper-outer quadrant of left female breast: Secondary | ICD-10-CM | POA: Diagnosis not present

## 2020-10-21 DIAGNOSIS — G893 Neoplasm related pain (acute) (chronic): Secondary | ICD-10-CM

## 2020-10-21 DIAGNOSIS — Z170 Estrogen receptor positive status [ER+]: Secondary | ICD-10-CM | POA: Diagnosis not present

## 2020-10-21 DIAGNOSIS — L98499 Non-pressure chronic ulcer of skin of other sites with unspecified severity: Secondary | ICD-10-CM | POA: Diagnosis not present

## 2020-10-21 DIAGNOSIS — C3490 Malignant neoplasm of unspecified part of unspecified bronchus or lung: Secondary | ICD-10-CM | POA: Diagnosis not present

## 2020-10-21 LAB — COMPREHENSIVE METABOLIC PANEL
ALT: 37 U/L (ref 0–44)
AST: 26 U/L (ref 15–41)
Albumin: 3.7 g/dL (ref 3.5–5.0)
Alkaline Phosphatase: 77 U/L (ref 38–126)
Anion gap: 8 (ref 5–15)
BUN: 5 mg/dL — ABNORMAL LOW (ref 6–20)
CO2: 26 mmol/L (ref 22–32)
Calcium: 8.7 mg/dL — ABNORMAL LOW (ref 8.9–10.3)
Chloride: 101 mmol/L (ref 98–111)
Creatinine, Ser: 0.6 mg/dL (ref 0.44–1.00)
GFR, Estimated: >60 mL/min (ref >60)
Glucose, Bld: 133 mg/dL — ABNORMAL HIGH (ref 70–99)
Potassium: 3.5 mmol/L (ref 3.5–5.1)
Sodium: 135 mmol/L (ref 135–145)
Total Bilirubin: 0.2 mg/dL — ABNORMAL LOW (ref 0.3–1.2)
Total Protein: 7.1 g/dL (ref 6.5–8.1)

## 2020-10-21 LAB — CBC WITH DIFFERENTIAL/PLATELET
Abs Immature Granulocytes: 0.41 10*3/uL — ABNORMAL HIGH (ref 0.00–0.07)
Basophils Absolute: 0.1 10*3/uL (ref 0.0–0.1)
Basophils Relative: 1 %
Eosinophils Absolute: 0 10*3/uL (ref 0.0–0.5)
Eosinophils Relative: 0 %
HCT: 35.4 % — ABNORMAL LOW (ref 36.0–46.0)
Hemoglobin: 11.9 g/dL — ABNORMAL LOW (ref 12.0–15.0)
Immature Granulocytes: 4 %
Lymphocytes Relative: 30 %
Lymphs Abs: 3.2 10*3/uL (ref 0.7–4.0)
MCH: 30.7 pg (ref 26.0–34.0)
MCHC: 33.6 g/dL (ref 30.0–36.0)
MCV: 91.2 fL (ref 80.0–100.0)
Monocytes Absolute: 1.1 10*3/uL — ABNORMAL HIGH (ref 0.1–1.0)
Monocytes Relative: 10 %
Neutro Abs: 5.7 10*3/uL (ref 1.7–7.7)
Neutrophils Relative %: 55 %
Platelets: 192 10*3/uL (ref 150–400)
RBC: 3.88 MIL/uL (ref 3.87–5.11)
RDW: 13 % (ref 11.5–15.5)
WBC: 10.5 10*3/uL (ref 4.0–10.5)
nRBC: 0.2 % (ref 0.0–0.2)

## 2020-10-21 MED ORDER — PALONOSETRON HCL INJECTION 0.25 MG/5ML
0.2500 mg | Freq: Once | INTRAVENOUS | Status: AC
  Administered 2020-10-21: 0.25 mg via INTRAVENOUS
  Filled 2020-10-21: qty 5

## 2020-10-21 MED ORDER — HEPARIN SOD (PORK) LOCK FLUSH 100 UNIT/ML IV SOLN
500.0000 [IU] | Freq: Once | INTRAVENOUS | Status: AC | PRN
  Administered 2020-10-21: 500 [IU]
  Filled 2020-10-21: qty 5

## 2020-10-21 MED ORDER — PEGFILGRASTIM 6 MG/0.6ML ~~LOC~~ PSKT
6.0000 mg | PREFILLED_SYRINGE | Freq: Once | SUBCUTANEOUS | Status: AC
  Administered 2020-10-21: 6 mg via SUBCUTANEOUS
  Filled 2020-10-21: qty 0.6

## 2020-10-21 MED ORDER — SODIUM CHLORIDE 0.9 % IV SOLN
Freq: Once | INTRAVENOUS | Status: AC
  Filled 2020-10-21: qty 250

## 2020-10-21 MED ORDER — DOXORUBICIN HCL CHEMO IV INJECTION 2 MG/ML
60.0000 mg/m2 | Freq: Once | INTRAVENOUS | Status: AC
  Administered 2020-10-21: 92 mg via INTRAVENOUS
  Filled 2020-10-21: qty 46

## 2020-10-21 MED ORDER — SODIUM CHLORIDE 0.9 % IV SOLN
150.0000 mg | Freq: Once | INTRAVENOUS | Status: AC
  Administered 2020-10-21: 150 mg via INTRAVENOUS
  Filled 2020-10-21: qty 150

## 2020-10-21 MED ORDER — SODIUM CHLORIDE 0.9 % IV SOLN
10.0000 mg | Freq: Once | INTRAVENOUS | Status: AC
  Administered 2020-10-21: 10 mg via INTRAVENOUS
  Filled 2020-10-21: qty 10

## 2020-10-21 MED ORDER — SODIUM CHLORIDE 0.9 % IV SOLN
900.0000 mg | Freq: Once | INTRAVENOUS | Status: AC
  Administered 2020-10-21: 900 mg via INTRAVENOUS
  Filled 2020-10-21: qty 45

## 2020-10-21 NOTE — Progress Notes (Signed)
Per MD reduce cytoxan dose to 900mg  due to weight loss and more than 10% calculated dose.

## 2020-10-21 NOTE — Progress Notes (Signed)
Hematology/Oncology Consult note [facility]  Telephone:([phone] [phone]  Patient Care Team: Burnard Hawthorne, FNP as PCP - General (Family Medicine)   Name of the patient: Tricia Berry  [id number]  September 12, 1971   Date of visit: 10/21/20  Diagnosis- invasive mammary carcinoma of the left breast anatomical stage III BC T4N 1M0 ER/PR positive HER-2 negative   Chief complaint/ Reason for visit-on treatment assessment prior to cycle 2 of dose dense AC chemotherapy neoadjuvant  Heme/Onc history: patient is a 49 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 She then noticed that her lump is gradually getting larger with an area of ulceration on the skin and underwent a diagnostic bilateral mammogram on 09/08/2020 which showed hypoechoic interconnected masses in the left breast from 10:00 to 2 o'clock position spanning at least an area of 4.8 cm. Ultrasound also showed 5 abnormal lymph nodes in the axilla with cortical thickening. Both the mass and the lymph nodes were biopsied and was consistent with invasive mammary carcinoma grade 2. Tumor was ER +91 200%, PR +11 to 20% and HER-2 negative.  Ki-67 15%.Patient will be meeting [name] from [facility] to discuss surgical management. She is here for medical oncology recommendations. In terms of her general health patient is otherwise doing well and does not have significant comorbidities. She does endorse significant pain in the area of her left breast. Tylenol and Motrin has not been helping her with this pain.She lives with her husband and 2 children who have special needs. No prior history of abnormal breast mammograms or breast biopsies.  PET CT scan showed hypermetabolism in the area of the left breast but no evidence  of hypermetabolism in the left axilla Or evidence of distant metastatic disease.  Neoadjuvant dose dense ACT chemotherapy started on 10/08/2020  Interval history- she developed mouth sores with chemo which improved with magic mouthwash. Reports ongoing fatigue. Pain well controlled with oxycodone  ECOG PS- 0 Pain scale- 0 Opioid associated constipation- no  Review of systems- Review of Systems  Constitutional: Positive for malaise/fatigue. Negative for chills, fever and weight loss.  HENT: Negative for congestion, ear discharge and nosebleeds.        Mouth sores  Eyes: Negative for blurred vision.  Respiratory: Negative for cough, hemoptysis, sputum production, shortness of breath and wheezing.   Cardiovascular: Negative for chest pain, palpitations, orthopnea and claudication.  Gastrointestinal: Negative for abdominal pain, blood in stool, constipation, diarrhea, heartburn, melena, nausea and vomiting.  Genitourinary: Negative for dysuria, flank pain, frequency, hematuria and urgency.  Musculoskeletal: Negative for back pain, joint pain and myalgias.  Skin: Negative for rash.  Neurological: Negative for dizziness, tingling, focal weakness, seizures, weakness and headaches.  Endo/Heme/Allergies: Does not bruise/bleed easily.  Psychiatric/Behavioral: Negative for depression and suicidal ideas. The patient does not have insomnia.       Allergies  Allergen Reactions  . Morphine Other (See Comments)    migranes  . Sertraline Hcl     REACTION: Worsened symptoms of IBS  . Sulfamethoxazole Rash  . Sulfonamide Derivatives Rash     Past Medical History:  Diagnosis Date  . Anxiety   . Breast cancer ([place])   . Diverticulitis   . GERD (gastroesophageal reflux disease)   . IBS (irritable bowel syndrome)   .  Scoliosis      Past Surgical History:  Procedure Laterality Date  . ABDOMINAL HYSTERECTOMY     still has ovaries, no gyn cancer, hysterectomy due to endometriosis.   Marland Kitchen  BREAST BIOPSY Left 09/15/2020   [place] bx of mass, path pending, Q marker  . BREAST BIOPSY Left 09/15/2020   [place] bx of LN, hydromarker, path pending  . IR IMAGING GUIDED PORT INSERTION  10/01/2020    Social History   Socioeconomic History  . Marital status: Divorced    Spouse name: Not on file  . Number of children: 2  . Years of education: Not on file  . Highest education level: Not on file  Occupational History  . Occupation: Optician, dispensing: [employer]  Tobacco Use  . Smoking status: Current Every Day Smoker  . Smokeless tobacco: Never Used  Substance and Sexual Activity  . Alcohol use: Yes  . Drug use: No  . Sexual activity: Not Currently    Birth control/protection: None  Other Topics Concern  . 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 She and her spouse are primary caregivers.    Social Determinants of Health   Financial Resource Strain: Not on file  Food Insecurity: Not on file  Transportation Needs: Not on file  Physical Activity: Not on file  Stress: Not on file  Social Connections: Not on file  Intimate Partner Violence: Not on file    Family History  Problem Relation Age of Onset  . Hypertension Mother   . Osteoarthritis Mother   . Diverticulitis Mother   . Heart failure Father   . Hypertension Father   . Gout Father   . Diverticulitis Brother   . Lung cancer Maternal Uncle   . Lung cancer Maternal Uncle      Current Outpatient Medications:  .  acetaminophen (TYLENOL) 325 MG tablet, Take 650 mg by mouth every 6 (six) hours as needed., Disp: , Rfl:  .  cyclobenzaprine (FLEXERIL) 10 MG tablet, Take 1 tablet (10 mg total) by mouth 3 (three) times daily as needed for muscle spasms. (Patient not taking: No sig reported), Disp: 30 tablet, Rfl: 0 .  DULoxetine (CYMBALTA) 30 MG capsule, Take one 30 mg tablet by mouth once a day for the first week. Then increase to two  30 mg tablets ( total 43m) by mouth once daily., Disp: 60 capsule, Rfl: 3 .  Lactobacillus (PROBIOTIC ACIDOPHILUS PO), Take 1 capsule by mouth daily., Disp: , Rfl:  .  lidocaine-prilocaine (EMLA) cream, Apply to affected area once (Patient not taking: No sig reported), Disp: 30 g, Rfl: 3 .  LORazepam (ATIVAN) 0.5 MG tablet, Take 1 tablet (0.5 mg total) by mouth every 6 (six) hours as needed (Nausea or vomiting)., Disp: 30 tablet, Rfl: 0 .  Multiple Vitamins-Minerals (MULTIVITAL PO), Take 1 Dose by mouth daily., Disp: , Rfl:  .  nicotine (NICODERM CQ - DOSED IN MG/24 HOURS) 21 mg/24hr patch, Place 1 patch (21 mg total) onto the skin daily., Disp: 28 patch, Rfl: 0 .  ondansetron (ZOFRAN) 8 MG tablet, Take 1 tablet (8 mg total) by mouth 2 (two) times daily as needed. Start on the third day after chemotherapy., Disp: 30 tablet, Rfl: 1 .  oxyCODONE (OXY IR/ROXICODONE) 5 MG immediate release tablet, Take 1 tablet (5 mg total) by mouth every 6 (six) hours as needed for  severe pain., Disp: 120 tablet, Rfl: 0 .  prochlorperazine (COMPAZINE) 10 MG tablet, Take 1 tablet (10 mg total) by mouth every 6 (six) hours as needed (Nausea or vomiting)., Disp: 30 tablet, Rfl: 1  Physical exam:  Vitals:   10/21/20 1131  BP: 122/87  Pulse: 85  Temp: 97.7 F (36.5 C)  TempSrc: Tympanic  SpO2: 98%  Weight: 117 lb 6.4 oz (53.3 kg)   Physical Exam HENT:     Mouth/Throat:     Mouth: Mucous membranes are moist.     Pharynx: Oropharynx is clear.  Eyes:     Extraocular Movements: EOM normal.     Pupils: Pupils are equal, round, and reactive to light.  Cardiovascular:     Rate and Rhythm: Normal rate and regular rhythm.     Heart sounds: Normal heart sounds.  Pulmonary:     Effort: Pulmonary effort is normal.     Breath sounds: Normal breath sounds.  Abdominal:     General: Bowel sounds are normal.     Palpations: Abdomen is soft.  Skin:    General: Skin is warm and dry.  Neurological:     Mental Status:  She is alert and oriented to person, place, and time.    Breast exam: there is a scab over the ulcerated left breast mass.  CMP Latest Ref Rng & Units 10/21/2020  Glucose 70 - 99 mg/dL 133(H)  BUN 6 - 20 mg/dL 5(L)  Creatinine 0.44 - 1.00 mg/dL 0.60  Sodium 135 - 145 mmol/L 135  Potassium 3.5 - 5.1 mmol/L 3.5  Chloride 98 - 111 mmol/L 101  CO2 22 - 32 mmol/L 26  Calcium 8.9 - 10.3 mg/dL 8.7(L)  Total Protein 6.5 - 8.1 g/dL 7.1  Total Bilirubin 0.3 - 1.2 mg/dL 0.2(L)  Alkaline Phos 38 - 126 U/L 77  AST 15 - 41 U/L 26  ALT 0 - 44 U/L 37   CBC Latest Ref Rng & Units 10/21/2020  WBC 4.0 - 10.5 K/uL 10.5  Hemoglobin 12.0 - 15.0 g/dL 11.9(L)  Hematocrit 36.0 - 46.0 % 35.4(L)  Platelets 150 - 400 K/uL 192    No images are attached to the encounter.  DG Lumbar Spine Complete  Result Date: 10/07/2020 CLINICAL DATA:  Low back pain.  History of scoliosis. EXAM: LUMBAR SPINE - COMPLETE 4+ VIEW COMPARISON:  PET CT 09/29/2020. FINDINGS: Severe lumbar spine scoliosis concave right. Diffuse multilevel degenerative change. No acute or focal bony abnormalities identified. Aortoiliac atherosclerotic vascular calcification. IMPRESSION: Severe lumbar spine scoliosis concave right. Diffuse multilevel degenerative change. No acute or focal bony abnormality identified. Electronically Signed   By: Marcello Moores  Register   On: 10/07/2020 13:23   MR BREAST BILATERAL W WO CONTRAST INC CAD  Result Date: 10/14/2020 CLINICAL DATA:  49 year old with biopsy-proven grade 2 invasive mammary carcinoma of no special type involving the UPPER OUTER QUADRANT of the LEFT breast (stage III, T4 N1 M0, ER positive, PR positive, HER 2 Neu negative) and biopsy-proven metastatic disease to a LEFT axillary lymph node. The patient presented with an ulcerating mass involving the OUTER LEFT breast 1 month ago and is currently undergoing neoadjuvant chemotherapy. Recent PET-CT demonstrated no distant metastatic disease. LABS:  Not  applicable. EXAM: BILATERAL BREAST MRI WITH AND WITHOUT CONTRAST TECHNIQUE: Multiplanar, multisequence MR images of both breasts were obtained prior to and following the intravenous administration of 5 ml of Gadavist. Three-dimensional MR images were rendered by post-processing of the original MR data on an independent  workstation. The three-dimensional MR images were interpreted, and findings are reported in the following complete MRI report for this study. Three dimensional images were evaluated at the independent interpreting workstation using the DynaCAD thin client. COMPARISON:  No prior MRI. Multiple prior mammograms and LEFT breast ultrasounds, most recently at the time of biopsy on 09/15/2020. FINDINGS: Breast composition: b. Scattered fibroglandular tissue. Background parenchymal enhancement: Moderate. Right breast: No suspicious mass or abnormal enhancement. Scattered enhancing foci, part of background parenchymal enhancement. Port-A-Cath port located in the [place] adjacent to the sternum. Left breast: Large enhancing mass involving the UPPER OUTER QUADRANT with associated skin retraction and skin involvement lateral to the nipple. The enhancing mass extends from the nipple posteriorly to abut the pectoralis muscle, though there is no abnormal enhancement of the muscle. The overall size of the enhancing mass is approximately 7.3 x 2.3 x 2.8 cm. Multiple enhancing satellite masses in the UPPER INNER QUADRANT. Stippled non-mass enhancement involving the central breast (subtracted images 44-49). Non-mass enhancement involving the LOWER OUTER QUADRANT (subtracted images 39-42). The overall span of the dominant mass with the satellite masses and non-mass enhancement is on the order of 9.2 x 7.3 x 6.4 cm. Lymph nodes: At least 4 abnormal LEFT axillary lymph nodes, including a retropectoral (level 2) node. No pathologic lymphadenopathy elsewhere. Ancillary findings:  None. IMPRESSION: 1. Extensive  multicentric LEFT breast cancer which extends from the nipple posteriorly to the pectoralis muscle (without evidence of muscle invasion), involves the skin just lateral to the nipple, with satellite masses in the [place] and non-mass enhancement suspicious for DCIS in the central breast and LOWER OUTER QUADRANT. Overall, the malignancy spans approximately 9.2 x 7.3 x 6.4 cm. The dominant mass in the [place] is measured above. 2. At least 4 abnormal LEFT axillary lymph nodes including a level 2 retropectoral node. 3. No MRI evidence of malignancy involving the RIGHT breast. RECOMMENDATION: 1. Treatment plan. 2. If breast sparing surgery is contemplated, then I would recommend MRI guided biopsy of the non-mass enhancement in the LOWER OUTER QUADRANT and the most medial of the satellite masses in the [place] to document the extent of disease. BI-RADS CATEGORY  6: Known biopsy-proven malignancy. Electronically Signed   By: Evangeline Dakin M.D.   On: 10/14/2020 14:36   NM PET Image Initial (PI) Skull Base To Thigh  Result Date: 09/29/2020 CLINICAL DATA:  Initial treatment strategy for LEFT breast carcinoma. COVID research shot RIGHT arm. EXAM: NUCLEAR MEDICINE PET SKULL BASE TO THIGH TECHNIQUE: 6.5 mCi F-18 FDG was injected intravenously. Full-ring PET imaging was performed from the skull base to thigh after the radiotracer. CT data was obtained and used for attenuation correction and anatomic localization. Fasting blood glucose: 95 mg/dl COMPARISON:  Breast MRI 09/15/2020 FINDINGS: Mediastinal blood pool activity: SUV max 2.1 Liver activity: SUV max 2.7 NECK: No hypermetabolic lymph nodes in the neck. Incidental CT findings: none CHEST: Intense metabolic activity in the posterior lateral aspect of the LEFT breast near the skin surface with SUV max equal 3.9 (image 148). No hypermetabolic LEFT axillary lymph nodes. No hypermetabolic internal mammary lymph nodes or mediastinal  lymph nodes. Mildly metabolic RIGHT axial lymph nodes have normal morphology there are favored related to recent COVID vaccination in RIGHT arm (SUV max equal 1.8 on image 189). Vaccination inflammation noted in the adjacent musculature. Incidental CT findings: none ABDOMEN/PELVIS: No abnormal hypermetabolic activity within the liver, pancreas, adrenal glands, or spleen. No hypermetabolic lymph  nodes in the abdomen or pelvis. Incidental CT findings: Low-attenuation within liver suggests mild hepatic steatosis. Multiple diverticula of the sigmoid colon without acute inflammation. SKELETON: No focal hypermetabolic activity to suggest skeletal metastasis. Incidental CT findings: none IMPRESSION: 1. Hypermetabolic lesion in the lateral LEFT breast consistent primary breast carcinoma. 2. No evidence of local LEFT axillary nodal metastasis. 3. No evidence of central thoracic nodal metastasis. 4. No evidence distant metastatic disease. 5. Mild metabolic activity of RIGHT axial lymph nodes related to COVID vaccination. 6. Incidental findings of mild hepatic steatosis and diverticulosis. Electronically Signed   By: Suzy Bouchard M.D.   On: 09/29/2020 17:18   ECHOCARDIOGRAM COMPLETE  Result Date: 10/05/2020    ECHOCARDIOGRAM REPORT   Patient Name:   GICELA SCHWARTING Date of Exam: 10/05/2020 Medical Rec #:  [id number]           Height:       61.0 in Accession #:    [id number]          Weight:       120.0 lb Date of Birth:  1971-01-30           BSA:          1.520 m Patient Age:    23 years            BP:           139/92 mmHg Patient Gender: F                   HR:           80 bpm. Exam Location:  [facility] Procedure: 2D Echo, Cardiac Doppler, Color Doppler and Strain Analysis Indications:     Chemotherapy evaluation V87.41  History:         Patient has no prior history of Echocardiogram examinations.                  Anxiety, breast cancer.  Sonographer:     Sherrie Sport RDCS (AE) Referring Phys:  [id number] [place] Anna Jantz [place]  Diagnosing Phys: Nelva Bush MD  Sonographer Comments: Global longitudinal strain was attempted. IMPRESSIONS  1. Left ventricular ejection fraction, by estimation, is 60 to 65%. The left ventricle has normal function. The left ventricle has no regional wall motion abnormalities. Left ventricular diastolic parameters were normal. The average left ventricular global longitudinal strain is -17.8 %. The global longitudinal strain is normal.  2. Right ventricular systolic function is normal. The right ventricular size is normal. There is normal pulmonary artery systolic pressure.  3. The mitral valve is normal in structure. Trivial mitral valve regurgitation. No evidence of mitral stenosis.  4. The aortic valve is tricuspid. Aortic valve regurgitation is not visualized. No aortic stenosis is present.  5. The inferior vena cava is normal in size with greater than 50% respiratory variability, suggesting right atrial pressure of 3 mmHg. FINDINGS  Left Ventricle: Left ventricular ejection fraction, by estimation, is 60 to 65%. The left ventricle has normal function. The left ventricle has no regional wall motion abnormalities. The average left ventricular global longitudinal strain is -17.8 %. The global longitudinal strain is normal. The left ventricular internal cavity size was normal in size. There is no left ventricular hypertrophy. Left ventricular diastolic parameters were normal. Right Ventricle: The right ventricular size is normal. No increase in right ventricular wall thickness. Right ventricular systolic function is normal. There is normal pulmonary artery systolic pressure. The tricuspid regurgitant velocity is 2.16 m/s, and  with an assumed right atrial pressure of 3 mmHg, the estimated right ventricular systolic pressure is 41.2 mmHg. Left Atrium: Left atrial size was normal in size. Right Atrium: Right atrial size was normal in size. Pericardium: There is no evidence of pericardial effusion. Mitral Valve:  The mitral valve is normal in structure. Trivial mitral valve regurgitation. No evidence of mitral valve stenosis. Tricuspid Valve: The tricuspid valve is normal in structure. Tricuspid valve regurgitation is trivial. Aortic Valve: The aortic valve is tricuspid. Aortic valve regurgitation is not visualized. No aortic stenosis is present. Aortic valve mean gradient measures 3.0 mmHg. Aortic valve peak gradient measures 6.0 mmHg. Aortic valve area, by VTI measures 2.08 cm. Pulmonic Valve: The pulmonic valve was grossly normal. Pulmonic valve regurgitation is trivial. No evidence of pulmonic stenosis. Aorta: The aortic root is normal in size and structure. Pulmonary Artery: The pulmonary artery is of normal size. Venous: The inferior vena cava is normal in size with greater than 50% respiratory variability, suggesting right atrial pressure of 3 mmHg. IAS/Shunts: No atrial level shunt detected by color flow Doppler.  LEFT VENTRICLE PLAX 2D LVIDd:         4.32 cm     Diastology LVIDs:         2.68 cm     LV e' medial:    8.59 cm/s LV PW:         0.79 cm     LV E/e' medial:  8.7 LV IVS:        0.73 cm     LV e' lateral:   8.59 cm/s LVOT diam:     2.00 cm     LV E/e' lateral: 8.7 LV SV:         46 LV SV Index:   30          2D Longitudinal Strain LVOT Area:     3.14 cm    2D Strain GLS Avg:     -17.8 %  LV Volumes (MOD) LV vol d, MOD A2C: 66.4 ml 3D Volume EF: LV vol d, MOD A4C: 66.8 ml 3D EF:        84 % LV vol s, MOD A2C: 30.5 ml LV EDV:       238 ml LV vol s, MOD A4C: 23.3 ml LV ESV:       39 ml LV SV MOD A2C:     35.9 ml LV SV:        199 ml LV SV MOD A4C:     66.8 ml LV SV MOD BP:      41.2 ml RIGHT VENTRICLE RV Basal diam:  3.01 cm RV S prime:     14.50 cm/s TAPSE (M-mode): 2.8 cm LEFT ATRIUM             Index       RIGHT ATRIUM           Index LA diam:        2.70 cm 1.78 cm/m  RA Area:     10.10 cm LA Vol (A2C):   30.6 ml 20.13 ml/m RA Volume:   20.70 ml  13.62 ml/m LA Vol (A4C):   22.5 ml 14.80 ml/m LA  Biplane Vol: 27.2 ml 17.89 ml/m  AORTIC VALVE                   PULMONIC VALVE AV Area (Vmax):    2.04 cm    PV Vmax:  0.60 m/s AV Area (Vmean):   1.76 cm    PV Peak grad:   1.4 mmHg AV Area (VTI):     2.08 cm    RVOT Peak grad: 3 mmHg AV Vmax:           122.00 cm/s AV Vmean:          86.200 cm/s AV VTI:            0.221 m AV Peak Grad:      6.0 mmHg AV Mean Grad:      3.0 mmHg LVOT Vmax:         79.30 cm/s LVOT Vmean:        48.400 cm/s LVOT VTI:          0.147 m LVOT/AV VTI ratio: 0.66  AORTA Ao Root diam: 2.70 cm MITRAL VALVE               TRICUSPID VALVE MV Area (PHT): 3.40 cm    TR Peak grad:   18.7 mmHg MV Decel Time: 223 msec    TR Vmax:        216.00 cm/s MV E velocity: 74.60 cm/s MV A velocity: 84.00 cm/s  SHUNTS MV E/A ratio:  0.89        Systemic VTI:  0.15 m                            Systemic Diam: 2.00 cm Nelva Bush MD Electronically signed by Nelva Bush MD Signature Date/Time: 10/05/2020/12:42:09 PM    Final    IR IMAGING GUIDED PORT INSERTION  Result Date: 10/01/2020 CLINICAL DATA:  Left breast carcinoma and need for porta cath to begin chemotherapy. EXAM: IMPLANTED PORT A CATH PLACEMENT WITH ULTRASOUND AND FLUOROSCOPIC GUIDANCE ANESTHESIA/SEDATION: 3.0 mg IV Versed; 100 mcg IV Fentanyl Total Moderate Sedation Time:  34 minutes The patient's level of consciousness and physiologic status were continuously monitored during the procedure by Radiology nursing. Additional Medications: 2 g IV Ancef. FLUOROSCOPY TIME:  48 seconds.  4.0 mGy. PROCEDURE: The procedure, risks, benefits, and alternatives were explained to the patient. Questions regarding the procedure were encouraged and answered. The patient understands and consents to the procedure. A time-out was performed prior to initiating the procedure. Ultrasound was utilized to confirm patency of the right internal jugular vein. The right neck and chest were prepped with chlorhexidine in a sterile fashion, and a sterile drape  was applied covering the operative field. Maximum barrier sterile technique with sterile gowns and gloves were used for the procedure. Local anesthesia was provided with 1% lidocaine. After creating a small venotomy incision, a 21 gauge needle was advanced into the right internal jugular vein under direct, real-time ultrasound guidance. Ultrasound image documentation was performed. After securing guidewire access, an 8 Fr dilator was placed. A J-wire was kinked to measure appropriate catheter length. A subcutaneous port pocket was then created along the upper chest wall utilizing sharp and blunt dissection. Portable cautery was utilized. The pocket was irrigated with sterile saline. A single lumen power injectable port was chosen for placement. The 8 Fr catheter was tunneled from the port pocket site to the venotomy incision. The port was placed in the pocket. External catheter was trimmed to appropriate length based on guidewire measurement. At the venotomy, an 8 Fr peel-away sheath was placed over a guidewire. The catheter was then placed through the sheath and the sheath removed. Final catheter positioning was confirmed  and documented with a fluoroscopic spot image. The port was accessed with a needle and aspirated and flushed with heparinized saline. The access needle was removed. The venotomy and port pocket incisions were closed with subcutaneous 3-0 Monocryl and subcuticular 4-0 Vicryl. Dermabond was applied to both incisions. COMPLICATIONS: COMPLICATIONS None FINDINGS: After catheter placement, the tip lies at the cavo-atrial junction. The catheter aspirates normally and is ready for immediate use. IMPRESSION: Placement of single lumen port a cath via right internal jugular vein. The catheter tip lies at the cavo-atrial junction. A power injectable port a cath was placed and is ready for immediate use. Electronically Signed   By: Aletta Edouard M.D.   On: 10/01/2020 16:38     Assessment and plan-  Patient is a 49 y.o. female with invasive mammary carcinoma of the left breast anatomical stage III B c4N 1M0 ER/PR positive and HER-2 negative.  She is here for on treatment assessment prior to cycle 2 of neoadjuvant dose dense AC chemotherapy  Counts ok to proceed with cycle 2 of dose dense AC chemotherapy with on pro neulasta support. I will see her in 2 weeks for cycle 3  Discussed results of mri which showed total area of abnormal enhancement along with dominant mass and satellite nodules close to 9.2 cm. This would be T4 disease  Mouth sores- due to chemo. Now resolved. Continue prn Magic mouthwash   Visit Diagnosis 1. Encounter for antineoplastic chemotherapy   2. Malignant neoplasm of upper-outer quadrant of left breast in female, estrogen receptor positive ([place])   3. Anxiety   4. Neoplasm related pain      Dr. Randa Evens, MD, MPH [facility] at [facility] [phone] 10/21/2020 8:08 AM

## 2020-10-26 ENCOUNTER — Other Ambulatory Visit: Payer: Self-pay | Admitting: *Deleted

## 2020-10-26 DIAGNOSIS — Z170 Estrogen receptor positive status [ER+]: Secondary | ICD-10-CM

## 2020-10-26 DIAGNOSIS — C50412 Malignant neoplasm of upper-outer quadrant of left female breast: Secondary | ICD-10-CM

## 2020-10-27 ENCOUNTER — Telehealth: Payer: Self-pay | Admitting: Family

## 2020-10-27 DIAGNOSIS — C50412 Malignant neoplasm of upper-outer quadrant of left female breast: Secondary | ICD-10-CM

## 2020-10-27 DIAGNOSIS — Z170 Estrogen receptor positive status [ER+]: Secondary | ICD-10-CM

## 2020-10-27 NOTE — Telephone Encounter (Signed)
Patient came into office to give her new [employer] card. She has to have a referral to Oncology, even though she is a patient there, per her new insurance. Patient has an appointment there on 11/05/2020

## 2020-10-27 NOTE — Telephone Encounter (Signed)
Can you place referral please?

## 2020-10-28 NOTE — Telephone Encounter (Signed)
Call pt Let her know I have placed referral per insurance to oncology

## 2020-11-01 ENCOUNTER — Encounter: Payer: Self-pay | Admitting: Family

## 2020-11-01 ENCOUNTER — Inpatient Hospital Stay: Payer: No Typology Code available for payment source | Attending: Oncology

## 2020-11-01 ENCOUNTER — Other Ambulatory Visit: Payer: Self-pay

## 2020-11-01 ENCOUNTER — Ambulatory Visit (INDEPENDENT_AMBULATORY_CARE_PROVIDER_SITE_OTHER): Payer: No Typology Code available for payment source | Admitting: Family

## 2020-11-01 ENCOUNTER — Telehealth: Payer: Self-pay | Admitting: Family

## 2020-11-01 VITALS — BP 122/72 | HR 94 | Temp 98.3°F | Ht 60.0 in | Wt 118.0 lb

## 2020-11-01 DIAGNOSIS — Z5111 Encounter for antineoplastic chemotherapy: Secondary | ICD-10-CM | POA: Insufficient documentation

## 2020-11-01 DIAGNOSIS — R300 Dysuria: Secondary | ICD-10-CM | POA: Diagnosis not present

## 2020-11-01 DIAGNOSIS — Z79899 Other long term (current) drug therapy: Secondary | ICD-10-CM | POA: Insufficient documentation

## 2020-11-01 DIAGNOSIS — B3731 Acute candidiasis of vulva and vagina: Secondary | ICD-10-CM | POA: Insufficient documentation

## 2020-11-01 DIAGNOSIS — C50412 Malignant neoplasm of upper-outer quadrant of left female breast: Secondary | ICD-10-CM | POA: Insufficient documentation

## 2020-11-01 DIAGNOSIS — B373 Candidiasis of vulva and vagina: Secondary | ICD-10-CM | POA: Insufficient documentation

## 2020-11-01 DIAGNOSIS — F411 Generalized anxiety disorder: Secondary | ICD-10-CM

## 2020-11-01 LAB — POCT URINALYSIS DIPSTICK
Bilirubin, UA: POSITIVE
Blood, UA: NEGATIVE
Glucose, UA: NEGATIVE
Ketones, UA: POSITIVE
Nitrite, UA: POSITIVE
Protein, UA: POSITIVE — AB
Spec Grav, UA: >=1.03 — AB (ref 1.010–1.025)
Urobilinogen, UA: 2 E.U./dL — AB
pH, UA: 6 (ref 5.0–8.0)

## 2020-11-01 LAB — URINALYSIS, ROUTINE W REFLEX MICROSCOPIC
Bilirubin Urine: NEGATIVE
Leukocytes,Ua: NEGATIVE
Nitrite: NEGATIVE
Specific Gravity, Urine: >=1.03 — AB (ref 1.000–1.030)
Total Protein, Urine: NEGATIVE
Urine Glucose: NEGATIVE
Urobilinogen, UA: 0.2 (ref 0.0–1.0)
pH: 6 (ref 5.0–8.0)

## 2020-11-01 MED ORDER — AMOXICILLIN-POT CLAVULANATE 875-125 MG PO TABS
1.0000 | ORAL_TABLET | Freq: Two times a day (BID) | ORAL | 0 refills | Status: AC
Start: 2020-11-01 — End: 2020-11-08

## 2020-11-01 NOTE — Assessment & Plan Note (Signed)
Unchanged. She increased cymbalta to 60mg  3 days ago. We jointly agreed to give cymbalta more time.

## 2020-11-01 NOTE — Telephone Encounter (Signed)
Pt called she is having congestion and wanted to have something called in

## 2020-11-01 NOTE — Progress Notes (Signed)
Nutrition Assessment   Reason for Assessment:  Poor appetite, weight loss   ASSESSMENT: 50 year old female with left breast cancer.  Past medical history of smoker, IBS, osteoarthritis.  Patient receiving neoadjuvant chemotherapy.   Met with patient in clinic.  Patient reports decreased appetite.  More increased nausea during the week following chemotherapy. Zofran and compazine help.  Patient experienced constipation week after treatment as well (took miralax).  Patient with mouth sores as well and using magic mouthwash.  Breakfast is usually applesauce or cream of wheat.  Lunch varies and maybe cereal.  Supper last night was chili that she could not eat due to mouth pain so ate lean cusine chicken dish.  Drinks boost shakes not daily, fills her up quickly.     Medications: reviewed   Labs: reviewed   Anthropometrics:  Height: 61 inches Weight: 118 lb today 123 lb when started treatment per patient Noted 122 lb 5 oz on 08/23/2020 BMI: 23  4% weight loss in the last 2 months   Estimated Energy Needs  Kcals: 1600-1800 Protein: 80-90 g  Fluid: 1.6 L   NUTRITION DIAGNOSIS: Inadequate oral intake related to cancer related treatment side effects as evidenced by 4% weight loss in 2 months, taste alterations, mouth sores, poor appetite    INTERVENTION:  Reviewed strategies to increase calories and protein. Discussed soft moist protein foods. Handout provided Discussed strategies to help with sore mouth and taste alterations. Handouts provided Recipe booklet provided as well Encouraged small frequent nibbles q 2 hours Contact information provided   MONITORING, EVALUATION, GOAL: weight trends, intake   Next Visit: Monday, Feb 7 phone call  Lavana Huckeba B. Zenia Resides, [place], [place] Registered Dietitian [phone] (mobile)

## 2020-11-01 NOTE — Patient Instructions (Signed)
Plenty of water Start augmentin  Ensure to take probiotics while on antibiotics and also for 2 weeks after completion. It is important to re-colonize the gut with good bacteria and also to prevent any diarrheal infections associated with antibiotic use.    Let me know how you are doing.

## 2020-11-01 NOTE — Assessment & Plan Note (Addendum)
UA shows leukocytes, nitrites. We agreed to treat ahead of urine culture. Start augmentin.

## 2020-11-01 NOTE — Telephone Encounter (Signed)
I spoke with patient & she stated that she is congested at night unable to breathe out her nose. She said that she has this & it is allergy related. I advised that this should have been relayed to [place] when she was here & screened. Per [name] I advised the Flonase OTC. If trouble sleepy acutely could use Afrin, but for no more than 3 nights in a row bc it is unsafe & may cause rebound congestion. Pt verbalized understanding.

## 2020-11-01 NOTE — Progress Notes (Signed)
Subjective:    Patient ID: Tricia Berry, female    DOB: 08/26/1971, 50 y.o.   MRN: [id number]  CC: Tricia Berry is a 50 y.o. female who presents today for follow up.   HPI: Complains of dysuria x one day, unchanged.   No fever, chills, flank pain,  Hematuria.   Last UTI, 3 months ago . No h/o renal stone.   Treated with keflex 2 months ago for breast abscess.    Anxiety- compliant with cymbalta, just started 60mg  3 days ago. No change in anxiety or back pain.   Following with Dr Tricia Berry for left breast cancer, currently doing chemo.    HISTORY:  Past Medical History:  Diagnosis Date  . Anxiety   . Breast cancer ([place])   . Diverticulitis   . GERD (gastroesophageal reflux disease)   . IBS (irritable bowel syndrome)   . Scoliosis    Past Surgical History:  Procedure Laterality Date  . ABDOMINAL HYSTERECTOMY     still has ovaries, no gyn cancer, hysterectomy due to endometriosis.   Marland Kitchen BREAST BIOPSY Left 09/15/2020   [place] bx of mass, path pending, Q marker  . BREAST BIOPSY Left 09/15/2020   [place] bx of LN, hydromarker, path pending  . IR IMAGING GUIDED PORT INSERTION  10/01/2020   Family History  Problem Relation Age of Onset  . Hypertension Mother   . Osteoarthritis Mother   . Diverticulitis Mother   . Heart failure Father   . Hypertension Father   . Gout Father   . Diverticulitis Brother   . Lung cancer Maternal Uncle   . Lung cancer Maternal Uncle     Allergies: Morphine, Sertraline hcl, Sulfamethoxazole, and Sulfonamide derivatives Current Outpatient Medications on File Prior to Visit  Medication Sig Dispense Refill  . acetaminophen (TYLENOL) 325 MG tablet Take 650 mg by mouth every 6 (six) hours as needed.    . cyclobenzaprine (FLEXERIL) 10 MG tablet Take 1 tablet (10 mg total) by mouth 3 (three) times daily as needed for muscle spasms. 30 tablet 0  . DULoxetine (CYMBALTA) 30 MG capsule Take one 30 mg tablet by mouth once a day for the first week. Then  increase to two 30 mg tablets ( total 60mg ) by mouth once daily. 60 capsule 3  . Lactobacillus (PROBIOTIC ACIDOPHILUS PO) Take 1 capsule by mouth daily.    Marland Kitchen lidocaine-prilocaine (EMLA) cream Apply to affected area once 30 g 3  . LORazepam (ATIVAN) 0.5 MG tablet Take 1 tablet (0.5 mg total) by mouth every 6 (six) hours as needed (Nausea or vomiting). 30 tablet 0  . Multiple Vitamins-Minerals (MULTIVITAL PO) Take 1 Dose by mouth daily.    . nicotine (NICODERM CQ - DOSED IN MG/24 HOURS) 21 mg/24hr patch Place 1 patch (21 mg total) onto the skin daily. 28 patch 0  . ondansetron (ZOFRAN) 8 MG tablet Take 1 tablet (8 mg total) by mouth 2 (two) times daily as needed. Start on the third day after chemotherapy. 30 tablet 1  . oxyCODONE (OXY IR/ROXICODONE) 5 MG immediate release tablet Take 1 tablet (5 mg total) by mouth every 6 (six) hours as needed for severe pain. 120 tablet 0  . prochlorperazine (COMPAZINE) 10 MG tablet Take 1 tablet (10 mg total) by mouth every 6 (six) hours as needed (Nausea or vomiting). 30 tablet 1   No current facility-administered medications on file prior to visit.    Social History   Tobacco Use  . Smoking status:  Current Every Day Smoker  . Smokeless tobacco: Never Used  Substance Use Topics  . Alcohol use: Yes  . Drug use: No    Review of Systems  Constitutional: Negative for chills and fever.  Respiratory: Negative for cough.   Cardiovascular: Negative for chest pain and palpitations.  Gastrointestinal: Negative for nausea and vomiting.  Genitourinary: Positive for dysuria. Negative for difficulty urinating, hematuria and urgency.  Musculoskeletal: Positive for back pain (chronic).  Psychiatric/Behavioral: Negative for suicidal ideas. The patient is nervous/anxious.       Objective:    BP 122/72   Pulse 94   Temp 98.3 F (36.8 C)   Ht 5' (1.524 m)   Wt 118 lb (53.5 kg)   SpO2 98%   BMI 23.05 kg/m  BP Readings from Last 3 Encounters:  11/01/20 122/72   10/21/20 122/87  10/11/20 115/72   Wt Readings from Last 3 Encounters:  11/01/20 118 lb (53.5 kg)  10/21/20 117 lb 6.4 oz (53.3 kg)  10/08/20 117 lb 11.2 oz (53.4 kg)    Physical Exam Vitals reviewed.  Constitutional:      Appearance: She is well-developed and well-nourished.  Cardiovascular:     Rate and Rhythm: Normal rate and regular rhythm.     Pulses: Normal pulses.     Heart sounds: Normal heart sounds.  Pulmonary:     Effort: Pulmonary effort is normal.     Breath sounds: Normal breath sounds. No wheezing, rhonchi or rales.  Abdominal:     Tenderness: There is no CVA tenderness.  Skin:    General: Skin is warm and dry.  Neurological:     Mental Status: She is alert.  Psychiatric:        Mood and Affect: Mood and affect normal.        Speech: Speech normal.        Behavior: Behavior normal.        Thought Content: Thought content normal.        Assessment & Plan:   Problem List Items Addressed This Visit      Other   Anxiety state    Unchanged. She increased cymbalta to 60mg  3 days ago. We jointly agreed to give cymbalta more time.       Dysuria - Primary    UA shows leukocytes, nitrites. We agreed to treat ahead of urine culture. Start augmentin.        Relevant Medications   amoxicillin-clavulanate (AUGMENTIN) 875-125 MG tablet   Other Relevant Orders   Urinalysis, Routine w reflex microscopic   Urine Culture   POCT urinalysis dipstick (Completed)       I am having Tricia Berry "Tricia Berry" start on amoxicillin-clavulanate. I am also having her maintain her cyclobenzaprine, Multiple Vitamins-Minerals (MULTIVITAL PO), Lactobacillus (PROBIOTIC ACIDOPHILUS PO), oxyCODONE, lidocaine-prilocaine, ondansetron, prochlorperazine, LORazepam, acetaminophen, DULoxetine, and nicotine.   Meds ordered this encounter  Medications  . amoxicillin-clavulanate (AUGMENTIN) 875-125 MG tablet    Sig: Take 1 tablet by mouth 2 (two) times daily for 7 days.     Dispense:  14 tablet    Refill:  0    Order Specific Question:   Supervising Provider    Answer:   Tricia Berry [[id number]]    Return precautions given.   Risks, benefits, and alternatives of the medications and treatment plan prescribed today were discussed, and patient expressed understanding.   Education regarding symptom management and diagnosis given to patient on AVS.  Continue to follow with Mable Paris  G, FNP for routine health maintenance.   Tricia Berry and I agreed with plan.   Mable Paris, FNP

## 2020-11-03 ENCOUNTER — Encounter: Payer: Self-pay | Admitting: Licensed Clinical Social Worker

## 2020-11-03 ENCOUNTER — Inpatient Hospital Stay (HOSPITAL_BASED_OUTPATIENT_CLINIC_OR_DEPARTMENT_OTHER): Payer: No Typology Code available for payment source | Admitting: Licensed Clinical Social Worker

## 2020-11-03 ENCOUNTER — Other Ambulatory Visit: Payer: Self-pay | Admitting: Family

## 2020-11-03 DIAGNOSIS — R300 Dysuria: Secondary | ICD-10-CM

## 2020-11-03 DIAGNOSIS — C50412 Malignant neoplasm of upper-outer quadrant of left female breast: Secondary | ICD-10-CM

## 2020-11-03 DIAGNOSIS — Z170 Estrogen receptor positive status [ER+]: Secondary | ICD-10-CM

## 2020-11-03 DIAGNOSIS — Z8041 Family history of malignant neoplasm of ovary: Secondary | ICD-10-CM | POA: Diagnosis not present

## 2020-11-03 LAB — URINE CULTURE
MICRO NUMBER:: 11399566
SPECIMEN QUALITY:: ADEQUATE

## 2020-11-03 NOTE — Progress Notes (Signed)
REFERRING PROVIDER: Sindy Guadeloupe, MD [place],  [place] [address]  PRIMARY PROVIDER:  Burnard Hawthorne, FNP  PRIMARY REASON FOR VISIT:  1. Malignant neoplasm of upper-outer quadrant of left breast in female, estrogen receptor positive ([place])   2. Family history of ovarian cancer    I connected with Tricia Berry on 11/03/2020 at 11:00 AM EDT by MyChart video conference and verified that I am speaking with the correct person using two identifiers.    Patient location: home Provider location: [place]:   Tricia Berry, a 50 y.o. female, was seen for a [facility] cancer genetics consultation at the request of Dr. Janese Banks due to a personal and family history of cancer.  [name] presents to clinic today to discuss the possibility of a hereditary predisposition to cancer, genetic testing, and to further clarify her future cancer risks, as well as potential cancer risks for family members.   In 2021, at the age of 19, Tricia Berry was diagnosed with invasive mammary carcinoma of the left breast, ER/PR+, Her2-. This is currently being treated with chemotherapy, to be followed by radiation, surgery and antiestrogen therapy.   CANCER HISTORY:  Oncology History  Malignant neoplasm of upper-outer quadrant of left breast in female, estrogen receptor positive ([place])  09/26/2020 Initial Diagnosis   Malignant neoplasm of upper-outer quadrant of left breast in female, estrogen receptor positive ([place])   10/08/2020 -  Chemotherapy    Patient is on Treatment Plan: BREAST ADJUVANT DOSE DENSE AC Q14D / PACLITAXEL Q7D      10/08/2020 Cancer Staging   Staging form: Breast, AJCC 8th Edition - Clinical stage from 10/08/2020: Stage IIIB (cT4, cN1, cM0, G2, ER+, PR+, HER2-) - Signed by Sindy Guadeloupe, MD on 10/08/2020      RISK FACTORS:  Menarche was at age 25.  First live birth at age 37.  OCP use for several years. Ovaries intact: yes.   Hysterectomy: yes.  Colonoscopy: yes; no polyps. Mammogram within the last year: yes. Number of breast biopsies: 1.   Past Medical History:  Diagnosis Date  . Anxiety   . Breast cancer ([place])   . Diverticulitis   . Family history of ovarian cancer   . GERD (gastroesophageal reflux disease)   . IBS (irritable bowel syndrome)   . Scoliosis     Past Surgical History:  Procedure Laterality Date  . ABDOMINAL HYSTERECTOMY     still has ovaries, no gyn cancer, hysterectomy due to endometriosis.   Marland Kitchen BREAST BIOPSY Left 09/15/2020   [place] bx of mass, path pending, Q marker  . BREAST BIOPSY Left 09/15/2020   [place] bx of LN, hydromarker, path pending  . IR IMAGING GUIDED PORT INSERTION  10/01/2020    Social History   Socioeconomic History  . Marital status: Divorced    Spouse name: Not on file  . Number of children: 2  . Years of education: Not on file  . Highest education level: Not on file  Occupational History  . Occupation: Optician, dispensing: [employer]  Tobacco Use  . Smoking status: Current Every Day Smoker  . Smokeless tobacco: Never Used  Substance and Sexual Activity  . Alcohol use: Yes  . Drug use: No  . Sexual activity: Not Currently    Birth control/protection: None  Other Topics Concern  . Not on file  Social History Narrative   Patient works as an Warden/ranger at [employer].  She has 2 children at home who have special needs. She and her spouse are primary caregivers.    Social Determinants of Health   Financial Resource Strain: Not on file  Food Insecurity: Not on file  Transportation Needs: Not on file  Physical Activity: Not on file  Stress: Not on file  Social Connections: Not on file     FAMILY HISTORY:  We obtained a detailed, 4-generation family history.  Significant diagnoses are listed below: Family History  Problem Relation Age of Onset  . Hypertension Berry   . Osteoarthritis Berry   . Diverticulitis Berry   . Heart failure Berry   .  Hypertension Berry   . Gout Berry   . Diverticulitis Brother   . Ovarian cancer Paternal Grandmother    Tricia Berry has 2 sons. She has 1 brother.   Tricia Berry is living at 40. No known cancers on this side of the family.   Tricia Berry died in his late 33s. His Berry had ovarian cancer, unknown age of diagnosis/death. No other known cancers on this side of the family.   Tricia Berry is unaware of previous family history of genetic testing for hereditary cancer risks. Patient's maternal ancestors are of [place] descent, and paternal ancestors are of [place] descent. There is no reported Ashkenazi Jewish ancestry. There is no known consanguinity.    GENETIC COUNSELING ASSESSMENT: Tricia Berry is a 49 y.o. female with a personal and family history of cancer which is somewhat suggestive of a hereditary cancer syndrome and predisposition to cancer. We, therefore, discussed and recommended the following at today's visit.   DISCUSSION: We discussed that approximately 5-10% of breast cancer is hereditary.  Most cases of hereditary breast and ovarian cancer are associated with BRCA1/BRCA2 genes, although there are other genes associated with hereditary cancer as well.  We discussed that testing is beneficial for several reasons including surgical decision-making for breast cancer, knowing about other cancer risks, identifying potential screening and risk-reduction options that may be appropriate, and to understand if other family members could be at risk for cancer and allow them to undergo genetic testing.   We reviewed the characteristics, features and inheritance patterns of hereditary cancer syndromes. We also discussed genetic testing, including the appropriate family members to test, the process of testing, insurance coverage and turn-around-time for results. We discussed the implications of a negative, positive and/or variant of uncertain significant result. We recommended Ms.  Berry pursue genetic testing for the [facility] CancerNext-Expanded+RNA gene panel.   The CancerNext-Expanded + RNAinsight gene panel offered by [employer] and includes sequencing and rearrangement analysis for the following 77 genes: IP, ALK, APC*, ATM*, AXIN2, BAP1, BARD1, BLM, BMPR1A, BRCA1*, BRCA2*, BRIP1*, CDC73, CDH1*,CDK4, CDKN1B, CDKN2A, CHEK2*, CTNNA1, DICER1, FANCC, FH, FLCN, GALNT12, KIF1B, LZTR1, MAX, MEN1, MET, MLH1*, MSH2*, MSH3, MSH6*, MUTYH*, NBN, NF1*, NF2, NTHL1, PALB2*, PHOX2B, PMS2*, POT1, PRKAR1A, PTCH1, PTEN*, RAD51C*, RAD51D*,RB1, RECQL, RET, SDHA, SDHAF2, SDHB, SDHC, SDHD, SMAD4, SMARCA4, SMARCB1, SMARCE1, STK11, SUFU, TMEM127, TP53*,TSC1, TSC2, VHL and XRCC2 (sequencing and deletion/duplication); EGFR, EGLN1, HOXB13, KIT, MITF, PDGFRA, POLD1 and POLE (sequencing only); EPCAM and GREM1 (deletion/duplication only).  Based on Tricia Berry's personal and family history of cancer, she meets medical criteria for genetic testing. Despite that she meets criteria, she may still have an out of pocket cost. We discussed that if her out of pocket cost for testing is over $100, the laboratory will call and confirm whether she wants to proceed with testing.  If the  out of pocket cost of testing is less than $100 she will be billed by the genetic testing laboratory.   PLAN: After considering the risks, benefits, and limitations, Tricia Berry provided informed consent to pursue genetic testing and the blood sample was sent to [facility] for analysis of the CancerNext-Expanded+RNA. Results should be available within approximately 2-3 weeks' time, at which point they will be disclosed by telephone to Tricia Berry, as will any additional recommendations warranted by these results. Tricia Berry will receive a summary of her genetic counseling visit and a copy of her results once available. This information will also be available in Epic.   Tricia Berry's questions were answered to her  satisfaction today. Our contact information was provided should additional questions or concerns arise. Thank you for the referral and allowing [place] to share in the care of your patient.   Faith Rogue, MS, [facility] Genetic Counselor [email or web address] Phone: [phone]  The patient was seen for a total of 30 minutes in face-to-face genetic counseling.  Dr. Grayland Ormond was available for discussion regarding this case.   _______________________________________________________________________ For Office Staff:  Number of people involved in session: 1 Was an Intern/ student involved with case: no

## 2020-11-04 ENCOUNTER — Other Ambulatory Visit: Payer: Self-pay | Admitting: Oncology

## 2020-11-04 ENCOUNTER — Encounter: Payer: Self-pay | Admitting: Oncology

## 2020-11-04 MED ORDER — OXYCODONE HCL 5 MG PO TABS: 5.0000 mg | ORAL_TABLET | Freq: Four times a day (QID) | ORAL | 0 refills | Status: DC | PRN

## 2020-11-05 ENCOUNTER — Encounter: Payer: Self-pay | Admitting: *Deleted

## 2020-11-05 ENCOUNTER — Inpatient Hospital Stay: Payer: No Typology Code available for payment source

## 2020-11-05 ENCOUNTER — Encounter: Payer: Self-pay | Admitting: Oncology

## 2020-11-05 ENCOUNTER — Inpatient Hospital Stay (HOSPITAL_BASED_OUTPATIENT_CLINIC_OR_DEPARTMENT_OTHER): Payer: No Typology Code available for payment source | Admitting: Oncology

## 2020-11-05 VITALS — BP 99/70 | HR 80

## 2020-11-05 VITALS — BP 116/80 | HR 84 | Temp 96.3°F | Resp 18 | Wt 117.5 lb

## 2020-11-05 DIAGNOSIS — C50412 Malignant neoplasm of upper-outer quadrant of left female breast: Secondary | ICD-10-CM

## 2020-11-05 DIAGNOSIS — R61 Generalized hyperhidrosis: Secondary | ICD-10-CM

## 2020-11-05 DIAGNOSIS — Z170 Estrogen receptor positive status [ER+]: Secondary | ICD-10-CM

## 2020-11-05 DIAGNOSIS — G893 Neoplasm related pain (acute) (chronic): Secondary | ICD-10-CM

## 2020-11-05 DIAGNOSIS — Z79899 Other long term (current) drug therapy: Secondary | ICD-10-CM | POA: Diagnosis not present

## 2020-11-05 DIAGNOSIS — Z5111 Encounter for antineoplastic chemotherapy: Secondary | ICD-10-CM | POA: Diagnosis not present

## 2020-11-05 DIAGNOSIS — C50912 Malignant neoplasm of unspecified site of left female breast: Secondary | ICD-10-CM

## 2020-11-05 LAB — CBC WITH DIFFERENTIAL/PLATELET
Abs Immature Granulocytes: 0.67 10*3/uL — ABNORMAL HIGH (ref 0.00–0.07)
Basophils Absolute: 0.1 10*3/uL (ref 0.0–0.1)
Basophils Relative: 1 %
Eosinophils Absolute: 0 10*3/uL (ref 0.0–0.5)
Eosinophils Relative: 0 %
HCT: 33.7 % — ABNORMAL LOW (ref 36.0–46.0)
Hemoglobin: 11.3 g/dL — ABNORMAL LOW (ref 12.0–15.0)
Immature Granulocytes: 5 %
Lymphocytes Relative: 22 %
Lymphs Abs: 2.9 10*3/uL (ref 0.7–4.0)
MCH: 31.5 pg (ref 26.0–34.0)
MCHC: 33.5 g/dL (ref 30.0–36.0)
MCV: 93.9 fL (ref 80.0–100.0)
Monocytes Absolute: 1.4 10*3/uL — ABNORMAL HIGH (ref 0.1–1.0)
Monocytes Relative: 11 %
Neutro Abs: 8 10*3/uL — ABNORMAL HIGH (ref 1.7–7.7)
Neutrophils Relative %: 61 %
Platelets: 260 10*3/uL (ref 150–400)
RBC: 3.59 MIL/uL — ABNORMAL LOW (ref 3.87–5.11)
RDW: 14.7 % (ref 11.5–15.5)
WBC: 13.1 10*3/uL — ABNORMAL HIGH (ref 4.0–10.5)
nRBC: 0.4 % — ABNORMAL HIGH (ref 0.0–0.2)

## 2020-11-05 LAB — COMPREHENSIVE METABOLIC PANEL
ALT: 28 U/L (ref 0–44)
AST: 22 U/L (ref 15–41)
Albumin: 4 g/dL (ref 3.5–5.0)
Alkaline Phosphatase: 71 U/L (ref 38–126)
Anion gap: 7 (ref 5–15)
BUN: 14 mg/dL (ref 6–20)
CO2: 28 mmol/L (ref 22–32)
Calcium: 9.1 mg/dL (ref 8.9–10.3)
Chloride: 100 mmol/L (ref 98–111)
Creatinine, Ser: 0.61 mg/dL (ref 0.44–1.00)
GFR, Estimated: >60 mL/min (ref >60)
Glucose, Bld: 108 mg/dL — ABNORMAL HIGH (ref 70–99)
Potassium: 3.9 mmol/L (ref 3.5–5.1)
Sodium: 135 mmol/L (ref 135–145)
Total Bilirubin: 0.2 mg/dL — ABNORMAL LOW (ref 0.3–1.2)
Total Protein: 7.1 g/dL (ref 6.5–8.1)

## 2020-11-05 MED ORDER — SODIUM CHLORIDE 0.9% FLUSH
10.0000 mL | INTRAVENOUS | Status: DC | PRN
  Administered 2020-11-05: 10 mL via INTRAVENOUS
  Filled 2020-11-05: qty 10

## 2020-11-05 MED ORDER — SODIUM CHLORIDE 0.9 % IV SOLN
150.0000 mg | Freq: Once | INTRAVENOUS | Status: AC
  Administered 2020-11-05: 150 mg via INTRAVENOUS
  Filled 2020-11-05: qty 150

## 2020-11-05 MED ORDER — SODIUM CHLORIDE 0.9 % IV SOLN
900.0000 mg | Freq: Once | INTRAVENOUS | Status: AC
  Administered 2020-11-05: 900 mg via INTRAVENOUS
  Filled 2020-11-05: qty 45

## 2020-11-05 MED ORDER — PALONOSETRON HCL INJECTION 0.25 MG/5ML
0.2500 mg | Freq: Once | INTRAVENOUS | Status: AC
  Administered 2020-11-05: 0.25 mg via INTRAVENOUS
  Filled 2020-11-05: qty 5

## 2020-11-05 MED ORDER — DOXORUBICIN HCL CHEMO IV INJECTION 2 MG/ML
60.0000 mg/m2 | Freq: Once | INTRAVENOUS | Status: AC
  Administered 2020-11-05: 92 mg via INTRAVENOUS
  Filled 2020-11-05: qty 46

## 2020-11-05 MED ORDER — SODIUM CHLORIDE 0.9 % IV SOLN
Freq: Once | INTRAVENOUS | Status: AC
  Filled 2020-11-05: qty 250

## 2020-11-05 MED ORDER — HEPARIN SOD (PORK) LOCK FLUSH 100 UNIT/ML IV SOLN
500.0000 [IU] | Freq: Once | INTRAVENOUS | Status: DC | PRN
  Filled 2020-11-05: qty 5

## 2020-11-05 MED ORDER — SODIUM CHLORIDE 0.9 % IV SOLN
10.0000 mg | Freq: Once | INTRAVENOUS | Status: AC
  Administered 2020-11-05: 10 mg via INTRAVENOUS
  Filled 2020-11-05: qty 10

## 2020-11-05 MED ORDER — HEPARIN SOD (PORK) LOCK FLUSH 100 UNIT/ML IV SOLN
500.0000 [IU] | Freq: Once | INTRAVENOUS | Status: AC
  Administered 2020-11-05: 500 [IU] via INTRAVENOUS
  Filled 2020-11-05: qty 5

## 2020-11-05 MED ORDER — PEGFILGRASTIM 6 MG/0.6ML ~~LOC~~ PSKT
6.0000 mg | PREFILLED_SYRINGE | Freq: Once | SUBCUTANEOUS | Status: AC
  Administered 2020-11-05: 6 mg via SUBCUTANEOUS

## 2020-11-05 NOTE — Progress Notes (Signed)
Hematology/Oncology Consult note [facility]  Telephone:([phone] [phone]  Patient Care Team: Burnard Hawthorne, FNP as PCP - General (Family Medicine)   Name of the patient: Tricia Berry  [id number]  1971/03/03   Date of visit: 11/05/20  Diagnosis- invasive mammary carcinoma of the left breast anatomical stage III BC T4N 1M0 ER/PR positive HER-2 negative   Chief complaint/ Reason for visit-on treatment assessment prior to cycle 3 of dose dense chemotherapy neoadjuvantly  Heme/Onc history: patient is a 50 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 She then noticed that her lump is gradually getting larger with an area of ulceration on the skin and underwent a diagnostic bilateral mammogram on 09/08/2020 which showed hypoechoic interconnected masses in the left breast from 10:00 to 2 o'clock position spanning at least an area of 4.8 cm. Ultrasound also showed 5 abnormal lymph nodes in the axilla with cortical thickening. Both the mass and the lymph nodes were biopsied and was consistent with invasive mammary carcinoma grade 2. Tumor was ER +91 200%, PR +11 to 20% and HER-2 negative.Ki-67 15%.Patient will be meeting [name] from [facility] surgery to discuss surgical management. She is here for medical oncology recommendations. In terms of her general health patient is otherwise doing well and does not have significant comorbidities. She does endorse significant pain in the area of her left breast. Tylenol and Motrin has not been helping her with this pain.She lives with her husband and 2 children who have special needs. No prior history of abnormal breast mammograms or breast biopsies.  PET CT scan showed hypermetabolism in the area of the left breast but no evidence of  hypermetabolism in the left axillaOr evidence of distant metastatic disease.  Neoadjuvant dose dense ACT chemotherapy started on 10/08/2020   Interval history-patient reports experiencing ongoing fatigue.  She does have mild mouth sores which is currently well controlled with Magic mouthwash.  Left breast pain is getting better.  She wakes up occasionally in the morning with drenching sweat.  ECOG PS- 0 Pain scale- 3 Opioid associated constipation- no  Review of systems- Review of Systems  Constitutional: Positive for malaise/fatigue. Negative for chills, fever and weight loss.  HENT: Negative for congestion, ear discharge and nosebleeds.        Mouth sores  Eyes: Negative for blurred vision.  Respiratory: Negative for cough, hemoptysis, sputum production, shortness of breath and wheezing.   Cardiovascular: Negative for chest pain, palpitations, orthopnea and claudication.  Gastrointestinal: Negative for abdominal pain, blood in stool, constipation, diarrhea, heartburn, melena, nausea and vomiting.  Genitourinary: Negative for dysuria, flank pain, frequency, hematuria and urgency.  Musculoskeletal: Negative for back pain, joint pain and myalgias.  Skin: Negative for rash.  Neurological: Negative for dizziness, tingling, focal weakness, seizures, weakness and headaches.  Endo/Heme/Allergies: Does not bruise/bleed easily.  Psychiatric/Behavioral: Negative for depression and suicidal ideas. The patient does not have insomnia.   Left breast pain  Allergies  Allergen Reactions  . Morphine Other (See Comments)    migranes  . Sertraline Hcl     REACTION: Worsened symptoms of IBS  . Sulfamethoxazole Rash  . Sulfonamide Derivatives Rash     Past Medical History:  Diagnosis Date  . Anxiety   . Breast cancer ([place])   . Diverticulitis   .  Family history of ovarian cancer   . GERD (gastroesophageal reflux disease)   . IBS (irritable bowel syndrome)   . Scoliosis      Past  Surgical History:  Procedure Laterality Date  . ABDOMINAL HYSTERECTOMY     still has ovaries, no gyn cancer, hysterectomy due to endometriosis.   Marland Kitchen BREAST BIOPSY Left 09/15/2020   [place] bx of mass, path pending, Q marker  . BREAST BIOPSY Left 09/15/2020   [place] bx of LN, hydromarker, path pending  . IR IMAGING GUIDED PORT INSERTION  10/01/2020    Social History   Socioeconomic History  . Marital status: Divorced    Spouse name: Not on file  . Number of children: 2  . Years of education: Not on file  . Highest education level: Not on file  Occupational History  . Occupation: Optician, dispensing: [employer]  Tobacco Use  . Smoking status: Current Every Day Smoker  . Smokeless tobacco: Never Used  Substance and Sexual Activity  . Alcohol use: Yes  . Drug use: No  . Sexual activity: Not Currently    Birth control/protection: None  Other Topics Concern  . 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 She and her spouse are primary caregivers.    Social Determinants of Health   Financial Resource Strain: Not on file  Food Insecurity: Not on file  Transportation Needs: Not on file  Physical Activity: Not on file  Stress: Not on file  Social Connections: Not on file  Intimate Partner Violence: Not on file    Family History  Problem Relation Age of Onset  . Hypertension Mother   . Osteoarthritis Mother   . Diverticulitis Mother   . Heart failure Father   . Hypertension Father   . Gout Father   . Diverticulitis Brother   . Ovarian cancer Paternal Grandmother      Current Outpatient Medications:  .  acetaminophen (TYLENOL) 325 MG tablet, Take 650 mg by mouth every 6 (six) hours as needed., Disp: , Rfl:  .  amoxicillin-clavulanate (AUGMENTIN) 875-125 MG tablet, Take 1 tablet by mouth 2 (two) times daily for 7 days., Disp: 14 tablet, Rfl: 0 .  cyclobenzaprine (FLEXERIL) 10 MG tablet, Take 1  tablet (10 mg total) by mouth 3 (three) times daily as needed for muscle spasms., Disp: 30 tablet, Rfl: 0 .  DULoxetine (CYMBALTA) 30 MG capsule, Take one 30 mg tablet by mouth once a day for the first week. Then increase to two 30 mg tablets ( total 63m) by mouth once daily., Disp: 60 capsule, Rfl: 3 .  Lactobacillus (PROBIOTIC ACIDOPHILUS PO), Take 1 capsule by mouth daily., Disp: , Rfl:  .  lidocaine-prilocaine (EMLA) cream, Apply to affected area once, Disp: 30 g, Rfl: 3 .  LORazepam (ATIVAN) 0.5 MG tablet, Take 1 tablet (0.5 mg total) by mouth every 6 (six) hours as needed (Nausea or vomiting)., Disp: 30 tablet, Rfl: 0 .  Multiple Vitamins-Minerals (MULTIVITAL PO), Take 1 Dose by mouth daily., Disp: , Rfl:  .  nicotine (NICODERM CQ - DOSED IN MG/24 HOURS) 21 mg/24hr patch, Place 1 patch (21 mg total) onto the skin daily., Disp: 28 patch, Rfl: 0 .  ondansetron (ZOFRAN) 8 MG tablet, Take 1 tablet (8 mg total) by mouth 2 (two) times daily as needed. Start on the third day after chemotherapy., Disp: 30 tablet,  Rfl: 1 .  oxyCODONE (OXY IR/ROXICODONE) 5 MG immediate release tablet, Take 1 tablet (5 mg total) by mouth every 6 (six) hours as needed for severe pain., Disp: 120 tablet, Rfl: 0 .  prochlorperazine (COMPAZINE) 10 MG tablet, Take 1 tablet (10 mg total) by mouth every 6 (six) hours as needed (Nausea or vomiting)., Disp: 30 tablet, Rfl: 1 No current facility-administered medications for this visit.  Facility-Administered Medications Ordered in Other Visits:  .  cyclophosphamide (CYTOXAN) 900 mg in sodium chloride 0.9 % 250 mL chemo infusion, 900 mg, Intravenous, Once, Randa Evens C, MD .  heparin lock flush 100 unit/mL, 500 Units, Intravenous, Once, Randa Evens C, MD .  heparin lock flush 100 unit/mL, 500 Units, Intracatheter, Once PRN, Sindy Guadeloupe, MD .  sodium chloride flush (NS) 0.9 % injection 10 mL, 10 mL, Intravenous, PRN, Sindy Guadeloupe, MD, 10 mL at 11/05/20 1110  Physical exam:   Vitals:   11/05/20 1227  BP: 116/80  Pulse: 84  Resp: 18  Temp: (!) 96.3 F (35.7 C)  TempSrc: Tympanic  SpO2: 99%  Weight: 117 lb 8 oz (53.3 kg)   Physical Exam Constitutional:      General: She is not in acute distress. HENT:     Mouth/Throat:     Mouth: Mucous membranes are moist.     Pharynx: Oropharynx is clear.     Comments: 2 small old healed ulcers seen on the buccal aspect of the right cheek Eyes:     Extraocular Movements: EOM normal.  Cardiovascular:     Rate and Rhythm: Normal rate and regular rhythm.     Heart sounds: Normal heart sounds.  Pulmonary:     Effort: Pulmonary effort is normal.     Breath sounds: Normal breath sounds.  Skin:    General: Skin is warm and dry.  Neurological:     Mental Status: She is alert and oriented to person, place, and time.   Breast exam: Left breast mass appears decreased in size although it is difficult to a certain its exact dimensions.  Previously ulcerated area also appears to be healing  CMP Latest Ref Rng & Units 11/05/2020  Glucose 70 - 99 mg/dL 108(H)  BUN 6 - 20 mg/dL 14  Creatinine 0.44 - 1.00 mg/dL 0.61  Sodium 135 - 145 mmol/L 135  Potassium 3.5 - 5.1 mmol/L 3.9  Chloride 98 - 111 mmol/L 100  CO2 22 - 32 mmol/L 28  Calcium 8.9 - 10.3 mg/dL 9.1  Total Protein 6.5 - 8.1 g/dL 7.1  Total Bilirubin 0.3 - 1.2 mg/dL 0.2(L)  Alkaline Phos 38 - 126 U/L 71  AST 15 - 41 U/L 22  ALT 0 - 44 U/L 28   CBC Latest Ref Rng & Units 11/05/2020  WBC 4.0 - 10.5 K/uL 13.1(H)  Hemoglobin 12.0 - 15.0 g/dL 11.3(L)  Hematocrit 36.0 - 46.0 % 33.7(L)  Platelets 150 - 400 K/uL 260    No images are attached to the encounter.  DG Lumbar Spine Complete  Result Date: 10/07/2020 CLINICAL DATA:  Low back pain.  History of scoliosis. EXAM: LUMBAR SPINE - COMPLETE 4+ VIEW COMPARISON:  PET CT 09/29/2020. FINDINGS: Severe lumbar spine scoliosis concave right. Diffuse multilevel degenerative change. No acute or focal bony abnormalities  identified. Aortoiliac atherosclerotic vascular calcification. IMPRESSION: Severe lumbar spine scoliosis concave right. Diffuse multilevel degenerative change. No acute or focal bony abnormality identified. Electronically Signed   By: Marcello Moores  Register   On: 10/07/2020 13:23  MR BREAST BILATERAL W WO CONTRAST INC CAD  Result Date: 10/14/2020 CLINICAL DATA:  50 year old with biopsy-proven grade 2 invasive mammary carcinoma of no special type involving the UPPER OUTER QUADRANT of the LEFT breast (stage III, T4 N1 M0, ER positive, PR positive, HER 2 Neu negative) and biopsy-proven metastatic disease to a LEFT axillary lymph node. The patient presented with an ulcerating mass involving the OUTER LEFT breast 1 month ago and is currently undergoing neoadjuvant chemotherapy. Recent PET-CT demonstrated no distant metastatic disease. LABS:  Not applicable. EXAM: BILATERAL BREAST MRI WITH AND WITHOUT CONTRAST TECHNIQUE: Multiplanar, multisequence MR images of both breasts were obtained prior to and following the intravenous administration of 5 ml of Gadavist. Three-dimensional MR images were rendered by post-processing of the original MR data on an independent workstation. The three-dimensional MR images were interpreted, and findings are reported in the following complete MRI report for this study. Three dimensional images were evaluated at the independent interpreting workstation using the DynaCAD thin client. COMPARISON:  No prior MRI. Multiple prior mammograms and LEFT breast ultrasounds, most recently at the time of biopsy on 09/15/2020. FINDINGS: Breast composition: b. Scattered fibroglandular tissue. Background parenchymal enhancement: Moderate. Right breast: No suspicious mass or abnormal enhancement. Scattered enhancing foci, part of background parenchymal enhancement. Port-A-Cath port located in the [place] adjacent to the sternum. Left breast: Large enhancing mass involving the UPPER OUTER  QUADRANT with associated skin retraction and skin involvement lateral to the nipple. The enhancing mass extends from the nipple posteriorly to abut the pectoralis muscle, though there is no abnormal enhancement of the muscle. The overall size of the enhancing mass is approximately 7.3 x 2.3 x 2.8 cm. Multiple enhancing satellite masses in the UPPER INNER QUADRANT. Stippled non-mass enhancement involving the central breast (subtracted images 44-49). Non-mass enhancement involving the LOWER OUTER QUADRANT (subtracted images 39-42). The overall span of the dominant mass with the satellite masses and non-mass enhancement is on the order of 9.2 x 7.3 x 6.4 cm. Lymph nodes: At least 4 abnormal LEFT axillary lymph nodes, including a retropectoral (level 2) node. No pathologic lymphadenopathy elsewhere. Ancillary findings:  None. IMPRESSION: 1. Extensive multicentric LEFT breast cancer which extends from the nipple posteriorly to the pectoralis muscle (without evidence of muscle invasion), involves the skin just lateral to the nipple, with satellite masses in the [place] and non-mass enhancement suspicious for DCIS in the central breast and LOWER OUTER QUADRANT. Overall, the malignancy spans approximately 9.2 x 7.3 x 6.4 cm. The dominant mass in the [place] is measured above. 2. At least 4 abnormal LEFT axillary lymph nodes including a level 2 retropectoral node. 3. No MRI evidence of malignancy involving the RIGHT breast. RECOMMENDATION: 1. Treatment plan. 2. If breast sparing surgery is contemplated, then I would recommend MRI guided biopsy of the non-mass enhancement in the LOWER OUTER QUADRANT and the most medial of the satellite masses in the [place] to document the extent of disease. BI-RADS CATEGORY  6: Known biopsy-proven malignancy. Electronically Signed   By: Evangeline Dakin M.D.   On: 10/14/2020 14:36     Assessment and plan- Patient is a 50 y.o. female with invasive  mammary carcinoma of the left breast anatomical stage III Bc4N 1M0 ER/PR positive and HER-2 negative.  She is here for on treatment assessment prior to cycle 3 of dose dense AC chemotherapy  Counts okay to proceed with cycle 3 of dose dense AC chemotherapy with on for Neulasta  support.  I will see her back in 2 weeks for cycle 4.  Plan to repeat left breast ultrasound after 4 cycles following which she will switch to weekly Taxol chemotherapy.  Mouth sores: Currently mild and come and go and overall well controlled with Magic mouthwash which she will continue  Neoplasm related pain: Continue as needed oxycodone  Patient is s/p hysterectomy but has ovaries in situ.  I will plan to check her hormone levels today including FSH and estradiol   Visit Diagnosis 1. Malignant neoplasm of upper-outer quadrant of left breast in female, estrogen receptor positive ([place])   2. Encounter for antineoplastic chemotherapy   3. Neoplasm related pain      Dr. Randa Evens, MD, MPH [facility] [phone] 11/05/2020 1:44 PM

## 2020-11-05 NOTE — Progress Notes (Signed)
Patient received prescribed treatment in clinic. Tolerated well. Patient stable at discharge. 

## 2020-11-06 LAB — ESTRADIOL: Estradiol: <5 pg/mL

## 2020-11-06 LAB — FOLLICLE STIMULATING HORMONE: FSH: 70.7 m[IU]/mL

## 2020-11-10 ENCOUNTER — Other Ambulatory Visit: Payer: Self-pay | Admitting: Oncology

## 2020-11-10 ENCOUNTER — Inpatient Hospital Stay (HOSPITAL_BASED_OUTPATIENT_CLINIC_OR_DEPARTMENT_OTHER): Payer: No Typology Code available for payment source | Admitting: Oncology

## 2020-11-10 ENCOUNTER — Telehealth: Payer: Self-pay | Admitting: *Deleted

## 2020-11-10 DIAGNOSIS — F17210 Nicotine dependence, cigarettes, uncomplicated: Secondary | ICD-10-CM | POA: Diagnosis not present

## 2020-11-10 DIAGNOSIS — F17200 Nicotine dependence, unspecified, uncomplicated: Secondary | ICD-10-CM

## 2020-11-10 DIAGNOSIS — N898 Other specified noninflammatory disorders of vagina: Secondary | ICD-10-CM

## 2020-11-10 MED ORDER — NICOTINE POLACRILEX 4 MG MT LOZG: 4.0000 mg | LOZENGE | OROMUCOSAL | 0 refills | Status: DC | PRN

## 2020-11-10 MED ORDER — NICOTINE 21 MG/24HR TD PT24: 21.0000 mg | MEDICATED_PATCH | Freq: Every day | TRANSDERMAL | 0 refills | Status: DC

## 2020-11-10 MED ORDER — NICOTINE 10 MG IN INHA: 1.0000 | RESPIRATORY_TRACT | 0 refills | Status: DC | PRN

## 2020-11-10 NOTE — Telephone Encounter (Signed)
Pt sent my chart message and called. She was having a problem trying tolink up wit jwnny NP to have smoking cessation. I sent secure chat to Sonia Baller and she said it will be on telephone and I relayed to the pt. Then the pt said that she is having redness and swollen labia. I sent another secure chat to Sonia Baller and she said she will help with that issue also. Pt was told the above info.

## 2020-11-10 NOTE — Progress Notes (Addendum)
Smoking Cessation [place]  [phone] Fax:([phone]  Patient Care Team: Burnard Hawthorne, FNP as PCP - General (Family Medicine)   Name of the patient: Tricia Berry  [id number]  1971-04-25   Date of visit: 11/10/2020   Diagnosis- Breast Cancer   Chief complaint/ Reason for visit- Smoking Cessation counseling  I connected with [place] on 11/11/20 at  9:30 AM EST by telephone visit and verified that I am speaking with the correct person using two identifiers.   I discussed the limitations, risks, security and privacy concerns of performing an evaluation and management service by telemedicine and the availability of in-person appointments. I also discussed with the patient that there may be a patient responsible charge related to this service. The patient expressed understanding and agreed to proceed.   Other persons participating in the visit and their role in the encounter: None   Patient's location: Home  Provider's location: Clinic you  PMH-Tricia Berry is a 50 year old female with past medical history significant for internal hemorrhoids, IBS, osteoarthritis, anxiety, tobacco abuse, depression, insomnia and recent diagnosis of left breast cancer followed by Dr. Janese Banks.  She has completed 3 cycles of Adriamycin and Cytoxan last given on 11/05/2020.  She was seen in clinic on 10/11/2020 for an acute add-on visit and smoking cessation.  She was started on 21 mg nicotine patch to see if she was able to decrease amount of cigarettes smoked.  Interval history- Tricia Berry presents back today for follow-up.  She reports not feeling well after her last chemotherapy.  Endorses extreme fatigue and weakness.  She has slept for the past 3 to 4 days.   She lives at home with her husband who also smokes.  This makes it very challenging for her to not smoke.  She states she has not really decreased amount of  cigarettes smoked.  She smokes about a pack per day.  She is concerned that the smoking will not allow the chemotherapy to work as effectively to treat her cancer.   Smoking history: Tobacco Use: High Risk  . Smoking Tobacco Use: Current Every Day Smoker  . Smokeless Tobacco Use: Never Used    She is very interested in quitting smoking but is under a lot of stress and not feeling great secondary to chemotherapy.  She is hoping that after her last cycle of Adriamycin and Cytoxan she will feel much better.  ECOG FS:1 - Symptomatic but completely ambulatory  Review of systems- Review of Systems  Constitutional: Positive for malaise/fatigue. Negative for chills, fever and weight loss.  HENT: Negative for congestion, ear pain and tinnitus.   Eyes: Negative.  Negative for blurred vision and double vision.  Respiratory: Negative.  Negative for cough, sputum production and shortness of breath.   Cardiovascular: Negative.  Negative for chest pain, palpitations and leg swelling.  Gastrointestinal: Negative.  Negative for abdominal pain, constipation, diarrhea, nausea and vomiting.  Genitourinary: Negative for dysuria, frequency and urgency.       Vaginal irritation  Musculoskeletal: Negative for back pain and falls.  Skin: Negative.  Negative for rash.  Neurological: Positive for weakness. Negative for headaches.  Endo/Heme/Allergies: Negative.  Does not bruise/bleed easily.  Psychiatric/Behavioral: Positive for depression. The patient is nervous/anxious. The patient does not have insomnia.       Allergies  Allergen Reactions  . Morphine Other (See Comments)    migranes  . Sertraline Hcl     REACTION: Worsened  symptoms of IBS  . Sulfamethoxazole Rash  . Sulfonamide Derivatives Rash     Past Medical History:  Diagnosis Date  . Anxiety   . Breast cancer ([place])   . Diverticulitis   . Family history of ovarian cancer   . GERD (gastroesophageal reflux disease)   . IBS (irritable bowel  syndrome)   . Scoliosis      Past Surgical History:  Procedure Laterality Date  . ABDOMINAL HYSTERECTOMY     still has ovaries, no gyn cancer, hysterectomy due to endometriosis.   [facility] BREAST BIOPSY Left 09/15/2020   [place] bx of mass, path pending, Q marker  . BREAST BIOPSY Left 09/15/2020   [place] bx of LN, hydromarker, path pending  . IR IMAGING GUIDED PORT INSERTION  10/01/2020    Social History   Socioeconomic History  . Marital status: Divorced    Spouse name: Not on file  . Number of children: 2  . Years of education: Not on file  . Highest education level: Not on file  Occupational History  . Occupation: Optician, dispensing: [employer]  Tobacco Use  . Smoking status: Current Every Day Smoker  . Smokeless tobacco: Never Used  Substance and Sexual Activity  . Alcohol use: Yes  . Drug use: No  . Sexual activity: Not Currently    Birth control/protection: None  Other Topics Concern  . 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 She and her spouse are primary caregivers.    Social Determinants of Health   Financial Resource Strain: Not on file  Food Insecurity: Not on file  Transportation Needs: Not on file  Physical Activity: Not on file  Stress: Not on file  Social Connections: Not on file  Intimate Partner Violence: Not on file    Family History  Problem Relation Age of Onset  . Hypertension Mother   . Osteoarthritis Mother   . Diverticulitis Mother   . Heart failure Father   . Hypertension Father   . Gout Father   . Diverticulitis Brother   . Ovarian cancer Paternal Grandmother      Current Outpatient Medications:  .  nicotine (NICOTROL) 10 MG inhaler, Inhale 1 Cartridge (1 continuous puffing total) into the lungs as needed for smoking cessation., Disp: 42 each, Rfl: 0 .  nicotine polacrilex (NICOTINE MINI) 4 MG lozenge, Take 1 lozenge (4 mg total) by mouth as needed for  smoking cessation., Disp: 100 tablet, Rfl: 0 .  acetaminophen (TYLENOL) 325 MG tablet, Take 650 mg by mouth every 6 (six) hours as needed., Disp: , Rfl:  .  cyclobenzaprine (FLEXERIL) 10 MG tablet, Take 1 tablet (10 mg total) by mouth 3 (three) times daily as needed for muscle spasms., Disp: 30 tablet, Rfl: 0 .  DULoxetine (CYMBALTA) 30 MG capsule, Take one 30 mg tablet by mouth once a day for the first week. Then increase to two 30 mg tablets ( total 60mg ) by mouth once daily., Disp: 60 capsule, Rfl: 3 .  Lactobacillus (PROBIOTIC ACIDOPHILUS PO), Take 1 capsule by mouth daily., Disp: , Rfl:  .  lidocaine-prilocaine (EMLA) cream, Apply to affected area once, Disp: 30 g, Rfl: 3 .  LORazepam (ATIVAN) 0.5 MG tablet, Take 1 tablet (0.5 mg total) by mouth every 6 (six) hours as needed (Nausea or vomiting)., Disp: 30 tablet, Rfl: 0 .  Multiple Vitamins-Minerals (MULTIVITAL PO), Take  1 Dose by mouth daily., Disp: , Rfl:  .  nicotine (NICODERM CQ - DOSED IN MG/24 HOURS) 21 mg/24hr patch, Place 1 patch (21 mg total) onto the skin daily., Disp: 28 patch, Rfl: 0 .  ondansetron (ZOFRAN) 8 MG tablet, Take 1 tablet (8 mg total) by mouth 2 (two) times daily as needed. Start on the third day after chemotherapy., Disp: 30 tablet, Rfl: 1 .  oxyCODONE (OXY IR/ROXICODONE) 5 MG immediate release tablet, Take 1 tablet (5 mg total) by mouth every 6 (six) hours as needed for severe pain., Disp: 120 tablet, Rfl: 0 .  prochlorperazine (COMPAZINE) 10 MG tablet, Take 1 tablet (10 mg total) by mouth every 6 (six) hours as needed (Nausea or vomiting)., Disp: 30 tablet, Rfl: 1  Physical exam: There were no vitals filed for this visit. Physical Exam   Limited secondary to virtual visit She is speaking in full complete sentences.  CMP Latest Ref Rng & Units 11/05/2020  Glucose 70 - 99 mg/dL 108(H)  BUN 6 - 20 mg/dL 14  Creatinine 0.44 - 1.00 mg/dL 0.61  Sodium 135 - 145 mmol/L 135  Potassium 3.5 - 5.1 mmol/L 3.9  Chloride 98  - 111 mmol/L 100  CO2 22 - 32 mmol/L 28  Calcium 8.9 - 10.3 mg/dL 9.1  Total Protein 6.5 - 8.1 g/dL 7.1  Total Bilirubin 0.3 - 1.2 mg/dL 0.2(L)  Alkaline Phos 38 - 126 U/L 71  AST 15 - 41 U/L 22  ALT 0 - 44 U/L 28   CBC Latest Ref Rng & Units 11/05/2020  WBC 4.0 - 10.5 K/uL 13.1(H)  Hemoglobin 12.0 - 15.0 g/dL 11.3(L)  Hematocrit 36.0 - 46.0 % 33.7(L)  Platelets 150 - 400 K/uL 260    No images are attached to the encounter.  MR BREAST BILATERAL W WO CONTRAST INC CAD  Result Date: 10/14/2020 CLINICAL DATA:  50 year old with biopsy-proven grade 2 invasive mammary carcinoma of no special type involving the UPPER OUTER QUADRANT of the LEFT breast (stage III, T4 N1 M0, ER positive, PR positive, HER 2 Neu negative) and biopsy-proven metastatic disease to a LEFT axillary lymph node. The patient presented with an ulcerating mass involving the OUTER LEFT breast 1 month ago and is currently undergoing neoadjuvant chemotherapy. Recent PET-CT demonstrated no distant metastatic disease. LABS:  Not applicable. EXAM: BILATERAL BREAST MRI WITH AND WITHOUT CONTRAST TECHNIQUE: Multiplanar, multisequence MR images of both breasts were obtained prior to and following the intravenous administration of 5 ml of Gadavist. Three-dimensional MR images were rendered by post-processing of the original MR data on an independent workstation. The three-dimensional MR images were interpreted, and findings are reported in the following complete MRI report for this study. Three dimensional images were evaluated at the independent interpreting workstation using the DynaCAD thin client. COMPARISON:  No prior MRI. Multiple prior mammograms and LEFT breast ultrasounds, most recently at the time of biopsy on 09/15/2020. FINDINGS: Breast composition: b. Scattered fibroglandular tissue. Background parenchymal enhancement: Moderate. Right breast: No suspicious mass or abnormal enhancement. Scattered enhancing foci, part of background  parenchymal enhancement. Port-A-Cath port located in the [place] adjacent to the sternum. Left breast: Large enhancing mass involving the UPPER OUTER QUADRANT with associated skin retraction and skin involvement lateral to the nipple. The enhancing mass extends from the nipple posteriorly to abut the pectoralis muscle, though there is no abnormal enhancement of the muscle. The overall size of the enhancing mass is approximately 7.3 x 2.3 x 2.8 cm. Multiple  enhancing satellite masses in the UPPER INNER QUADRANT. Stippled non-mass enhancement involving the central breast (subtracted images 44-49). Non-mass enhancement involving the LOWER OUTER QUADRANT (subtracted images 39-42). The overall span of the dominant mass with the satellite masses and non-mass enhancement is on the order of 9.2 x 7.3 x 6.4 cm. Lymph nodes: At least 4 abnormal LEFT axillary lymph nodes, including a retropectoral (level 2) node. No pathologic lymphadenopathy elsewhere. Ancillary findings:  None. IMPRESSION: 1. Extensive multicentric LEFT breast cancer which extends from the nipple posteriorly to the pectoralis muscle (without evidence of muscle invasion), involves the skin just lateral to the nipple, with satellite masses in the [place] and non-mass enhancement suspicious for DCIS in the central breast and LOWER OUTER QUADRANT. Overall, the malignancy spans approximately 9.2 x 7.3 x 6.4 cm. The dominant mass in the [place] is measured above. 2. At least 4 abnormal LEFT axillary lymph nodes including a level 2 retropectoral node. 3. No MRI evidence of malignancy involving the RIGHT breast. RECOMMENDATION: 1. Treatment plan. 2. If breast sparing surgery is contemplated, then I would recommend MRI guided biopsy of the non-mass enhancement in the LOWER OUTER QUADRANT and the most medial of the satellite masses in the [place] to document the extent of disease. BI-RADS CATEGORY  6: Known  biopsy-proven malignancy. Electronically Signed   By: Evangeline Dakin M.D.   On: 10/14/2020 14:36     Assessment and plan- Patient is a 50 y.o. femalewho presents to smoking cessation clinic to discuss strategies, medications and interventions needed to quit smoking.  Smoking cessation: -Continue 21 mg nicotine patches daily. -Add nicotine lozenges and nicotine inhaler.  -New prescriptions called into her pharmacy. -Spoke at length about Wellbutrin or nortriptyline as possible options but will hold off until after she finishes chemotherapy with AC.  She is in agreement.  Vaginal irritation/labia discomfort: -Secondary to treatment. -Recommend Replens to help with vaginal dryness. -If symptoms are persistent or worsen, would recommend evaluation by gynecology for possible wet prep for candidiasis versus bacterial vaginosis. -Patient in agreement.  Follow-up plan: -RTC in 1 month to see how she is tolerating inhaler. - Call clinic if vaginal irritation is not improved.   Visit Diagnosis 1. TOBACCO ABUSE   2. Vaginal irritation     Patient expressed understanding and was in agreement with this plan. She also understands that She can call clinic at any time with any questions, concerns, or complaints.   I provided 15 minutes of non face-to-face telephone visit time during this encounter, and > 50% was spent counseling as documented under my assessment & plan. Thank you for allowing me to participate in the care of this very pleasant patient.    Jacquelin Hawking, NP [place] at [facility] Cell - [phone] Pager- [phone] 11/11/2020 4:15 PM

## 2020-11-11 ENCOUNTER — Other Ambulatory Visit: Payer: Self-pay | Admitting: Nurse Practitioner

## 2020-11-11 MED ORDER — FLUCONAZOLE 150 MG PO TABS: 150.0000 mg | ORAL_TABLET | Freq: Every day | ORAL | 1 refills | Status: DC

## 2020-11-16 ENCOUNTER — Other Ambulatory Visit: Payer: Self-pay

## 2020-11-16 ENCOUNTER — Encounter: Payer: No Typology Code available for payment source | Attending: Family

## 2020-11-16 VITALS — Ht 62.0 in | Wt 117.9 lb

## 2020-11-16 DIAGNOSIS — Z170 Estrogen receptor positive status [ER+]: Secondary | ICD-10-CM

## 2020-11-16 DIAGNOSIS — C50412 Malignant neoplasm of upper-outer quadrant of left female breast: Secondary | ICD-10-CM

## 2020-11-16 NOTE — Progress Notes (Signed)
Daily Session Note  Patient Details  Name: Tricia Berry MRN: [id number] Date of Birth: Oct 11, 1971 Referring Provider:    Encounter Date: 11/16/2020  Check In:  Session Check In - 11/16/20 1355      Check-In   Supervising physician immediately available to respond to emergencies See telemetry face sheet for immediately available ER MD    Location [facility]    Staff Present Coralie Keens, MS Exercise Physiologist;Jessica Luan Pulling, MA, RCEP, CCRP, CCET    Virtual Visit No    Medication changes reported     No    Fall or balance concerns reported    No    Tobacco Cessation No Change    Current number of cigarettes/nicotine per day     13    Warm-up and Cool-down Not performed (comment)   6MWT and Gym Orientation   Resistance Training Performed No   6MWT   VAD Patient? No    PAD/SET Patient? No      Pain Assessment   Currently in Pain? No/denies             6 Minute Walk    Row Name 11/16/20 1358         6 Minute Walk   Phase Initial     Distance 1190 feet     Walk Time 6 minutes     # of Rest Breaks 0     MPH 2.25     METS 3.96     RPE 11     Perceived Dyspnea  0     VO2 Peak 13.87     Symptoms No     Resting HR 96 bpm     Resting BP 98/64     Resting Oxygen Saturation  97 %     Exercise Oxygen Saturation  during 6 min walk 93 %     Max Ex. HR 113 bpm     Max Ex. BP 112/66     2 Minute Post BP 94/64             Exercise Prescription Changes - 11/16/20 1400      Response to Exercise   Blood Pressure (Admit) 98/64    Blood Pressure (Exercise) 112/66    Blood Pressure (Exit) 94/64    Heart Rate (Admit) 96 bpm    Heart Rate (Exercise) 113 bpm    Heart Rate (Exit) 91 bpm    Oxygen Saturation (Admit) 97 %    Oxygen Saturation (Exercise) 93 %    Oxygen Saturation (Exit) 98 %    Rating of Perceived Exertion (Exercise) 11    Perceived Dyspnea (Exercise) 0    Symptoms none    Comments walk test results      Resistance Training    Weight 3 lb   ROM on left side   Reps 10-15      Treadmill   MPH 2.2    Grade 0.5    Minutes 15    METs 2.84      Recumbant Bike   Level 2    RPM 60    Watts 25    Minutes 15    METs 3.9      NuStep   Level 3    SPM 80    Minutes 15    METs 3.9      REL-XR   Level 2    Minutes 15    METs 3.9  Social History   Tobacco Use  Smoking Status Current Every Day Smoker  Smokeless Tobacco Never Used    Steffanie Dunn is a current tobacco user. Intervention for tobacco cessation was provided at the initial medical review. She was asked about readiness to quit and reported she is trying to quit but has not completely all together. She has gone from 25 cigarettes/day to about 13. She does not have a quit date set but may think about setting one. Patient was advised and educated about tobacco cessation using combination therapy, tobacco cessation classes, quit line, and quit smoking apps. Patient demonstrated understanding of this material. Staff will continue to provide encouragement and follow up with the patient throughout the program.     Goals Met:  Exercise tolerated well Personal goals reviewed Queuing for purse lip breathing No report of cardiac concerns or symptoms  Goals Unmet:  Not Applicable  Comments: First full day of of orientation. All starting workloads were established based on the results of the 6 minute walk test done at initial orientation visit.  The plan for exercise progression was also introduced and progression will be customized based on patient's performance and goals.   Dr. Emily Filbert is Medical Director for [place] and [facility].

## 2020-11-18 ENCOUNTER — Encounter: Payer: Self-pay | Admitting: Family

## 2020-11-18 ENCOUNTER — Other Ambulatory Visit: Payer: Self-pay

## 2020-11-18 ENCOUNTER — Other Ambulatory Visit (INDEPENDENT_AMBULATORY_CARE_PROVIDER_SITE_OTHER): Payer: No Typology Code available for payment source

## 2020-11-18 DIAGNOSIS — R300 Dysuria: Secondary | ICD-10-CM | POA: Diagnosis not present

## 2020-11-18 DIAGNOSIS — C50412 Malignant neoplasm of upper-outer quadrant of left female breast: Secondary | ICD-10-CM

## 2020-11-18 DIAGNOSIS — Z170 Estrogen receptor positive status [ER+]: Secondary | ICD-10-CM

## 2020-11-18 LAB — URINALYSIS, ROUTINE W REFLEX MICROSCOPIC
Bilirubin Urine: NEGATIVE
Nitrite: NEGATIVE
Specific Gravity, Urine: 1.025 (ref 1.000–1.030)
Total Protein, Urine: 30 — AB
Urine Glucose: NEGATIVE
Urobilinogen, UA: 0.2 (ref 0.0–1.0)
pH: 6 (ref 5.0–8.0)

## 2020-11-18 NOTE — Progress Notes (Signed)
Daily Session Note  Patient Details  Name: Tricia Berry MRN: [id number] Date of Birth: 05-14-1971 Referring Provider:    Encounter Date: 11/18/2020  Check In:  Session Check In - 11/18/20 1346      Check-In   Supervising physician immediately available to respond to emergencies See telemetry face sheet for immediately available ER MD    Location [facility]    Staff Present Coralie Keens, MS Exercise Physiologist;Kelly Rosalia Hammers, MPA, RN    Virtual Visit No    Medication changes reported     No    Fall or balance concerns reported    No    Tobacco Cessation No Change    Warm-up and Cool-down Performed on first and last piece of equipment    Resistance Training Performed Yes    VAD Patient? No    PAD/SET Patient? No              Social History   Tobacco Use  Smoking Status Current Every Day Smoker  Smokeless Tobacco Never Used    Goals Met:  [place] with exercise equipment Exercise tolerated well No report of cardiac concerns or symptoms Strength training completed today  Goals Unmet:  Not Applicable  Comments: First full day of exercise!  Patient was oriented to gym and equipment including functions, settings, policies, and procedures.  Patient's individual exercise prescription and treatment plan were reviewed.  All starting workloads were established based on the results of the 6 minute walk test done at initial orientation visit.  The plan for exercise progression was also introduced and progression will be customized based on patient's performance and goals.   Dr. Emily Filbert is Medical Director for [place] and [facility].

## 2020-11-19 ENCOUNTER — Telehealth: Payer: Self-pay | Admitting: *Deleted

## 2020-11-19 ENCOUNTER — Encounter: Payer: Self-pay | Admitting: Oncology

## 2020-11-19 ENCOUNTER — Inpatient Hospital Stay: Payer: No Typology Code available for payment source

## 2020-11-19 ENCOUNTER — Other Ambulatory Visit: Payer: Self-pay | Admitting: *Deleted

## 2020-11-19 ENCOUNTER — Other Ambulatory Visit: Payer: Self-pay | Admitting: Family

## 2020-11-19 ENCOUNTER — Telehealth: Payer: Self-pay | Admitting: Licensed Clinical Social Worker

## 2020-11-19 ENCOUNTER — Other Ambulatory Visit: Payer: Self-pay

## 2020-11-19 ENCOUNTER — Inpatient Hospital Stay (HOSPITAL_BASED_OUTPATIENT_CLINIC_OR_DEPARTMENT_OTHER): Payer: No Typology Code available for payment source | Admitting: Oncology

## 2020-11-19 ENCOUNTER — Encounter: Payer: Self-pay | Admitting: *Deleted

## 2020-11-19 VITALS — BP 105/87 | HR 102 | Temp 96.4°F | Wt 117.3 lb

## 2020-11-19 VITALS — HR 90 | Resp 18

## 2020-11-19 DIAGNOSIS — C50412 Malignant neoplasm of upper-outer quadrant of left female breast: Secondary | ICD-10-CM

## 2020-11-19 DIAGNOSIS — R300 Dysuria: Secondary | ICD-10-CM

## 2020-11-19 DIAGNOSIS — Z5111 Encounter for antineoplastic chemotherapy: Secondary | ICD-10-CM

## 2020-11-19 DIAGNOSIS — Z170 Estrogen receptor positive status [ER+]: Secondary | ICD-10-CM

## 2020-11-19 DIAGNOSIS — N3000 Acute cystitis without hematuria: Secondary | ICD-10-CM | POA: Diagnosis not present

## 2020-11-19 LAB — COMPREHENSIVE METABOLIC PANEL
ALT: 25 U/L (ref 0–44)
AST: 22 U/L (ref 15–41)
Albumin: 3.8 g/dL (ref 3.5–5.0)
Alkaline Phosphatase: 69 U/L (ref 38–126)
Anion gap: 12 (ref 5–15)
BUN: 16 mg/dL (ref 6–20)
CO2: 23 mmol/L (ref 22–32)
Calcium: 9.1 mg/dL (ref 8.9–10.3)
Chloride: 99 mmol/L (ref 98–111)
Creatinine, Ser: 0.57 mg/dL (ref 0.44–1.00)
GFR, Estimated: >60 mL/min (ref >60)
Glucose, Bld: 134 mg/dL — ABNORMAL HIGH (ref 70–99)
Potassium: 4 mmol/L (ref 3.5–5.1)
Sodium: 134 mmol/L — ABNORMAL LOW (ref 135–145)
Total Bilirubin: 0.3 mg/dL (ref 0.3–1.2)
Total Protein: 7.5 g/dL (ref 6.5–8.1)

## 2020-11-19 LAB — CBC WITH DIFFERENTIAL/PLATELET
Abs Immature Granulocytes: 0.27 10*3/uL — ABNORMAL HIGH (ref 0.00–0.07)
Basophils Absolute: 0.1 10*3/uL (ref 0.0–0.1)
Basophils Relative: 1 %
Eosinophils Absolute: 0 10*3/uL (ref 0.0–0.5)
Eosinophils Relative: 0 %
HCT: 33.4 % — ABNORMAL LOW (ref 36.0–46.0)
Hemoglobin: 11.1 g/dL — ABNORMAL LOW (ref 12.0–15.0)
Immature Granulocytes: 3 %
Lymphocytes Relative: 14 %
Lymphs Abs: 1.5 10*3/uL (ref 0.7–4.0)
MCH: 31.8 pg (ref 26.0–34.0)
MCHC: 33.2 g/dL (ref 30.0–36.0)
MCV: 95.7 fL (ref 80.0–100.0)
Monocytes Absolute: 1.1 10*3/uL — ABNORMAL HIGH (ref 0.1–1.0)
Monocytes Relative: 11 %
Neutro Abs: 7.4 10*3/uL (ref 1.7–7.7)
Neutrophils Relative %: 71 %
Platelets: 174 10*3/uL (ref 150–400)
RBC: 3.49 MIL/uL — ABNORMAL LOW (ref 3.87–5.11)
RDW: 17.8 % — ABNORMAL HIGH (ref 11.5–15.5)
WBC: 10.3 10*3/uL (ref 4.0–10.5)
nRBC: 0 % (ref 0.0–0.2)

## 2020-11-19 MED ORDER — PEGFILGRASTIM 6 MG/0.6ML ~~LOC~~ PSKT
6.0000 mg | PREFILLED_SYRINGE | Freq: Once | SUBCUTANEOUS | Status: AC
  Administered 2020-11-19: 6 mg via SUBCUTANEOUS
  Filled 2020-11-19: qty 0.6

## 2020-11-19 MED ORDER — PALONOSETRON HCL INJECTION 0.25 MG/5ML
0.2500 mg | Freq: Once | INTRAVENOUS | Status: AC
  Administered 2020-11-19: 0.25 mg via INTRAVENOUS
  Filled 2020-11-19: qty 5

## 2020-11-19 MED ORDER — NITROFURANTOIN MONOHYD MACRO 100 MG PO CAPS: 100.0000 mg | ORAL_CAPSULE | Freq: Two times a day (BID) | ORAL | 0 refills | Status: DC

## 2020-11-19 MED ORDER — SODIUM CHLORIDE 0.9% FLUSH
10.0000 mL | INTRAVENOUS | Status: DC | PRN
  Administered 2020-11-19: 10 mL via INTRAVENOUS
  Filled 2020-11-19: qty 10

## 2020-11-19 MED ORDER — SODIUM CHLORIDE 0.9 % IV SOLN
150.0000 mg | Freq: Once | INTRAVENOUS | Status: AC
  Administered 2020-11-19: 150 mg via INTRAVENOUS
  Filled 2020-11-19: qty 150

## 2020-11-19 MED ORDER — SODIUM CHLORIDE 0.9 % IV SOLN
Freq: Once | INTRAVENOUS | Status: AC
  Filled 2020-11-19: qty 250

## 2020-11-19 MED ORDER — SODIUM CHLORIDE 0.9 % IV SOLN
588.0000 mg/m2 | Freq: Once | INTRAVENOUS | Status: AC
  Administered 2020-11-19: 900 mg via INTRAVENOUS
  Filled 2020-11-19: qty 45

## 2020-11-19 MED ORDER — HEPARIN SOD (PORK) LOCK FLUSH 100 UNIT/ML IV SOLN
INTRAVENOUS | Status: AC
  Filled 2020-11-19: qty 5

## 2020-11-19 MED ORDER — DOXORUBICIN HCL CHEMO IV INJECTION 2 MG/ML
60.0000 mg/m2 | Freq: Once | INTRAVENOUS | Status: AC
  Administered 2020-11-19: 92 mg via INTRAVENOUS
  Filled 2020-11-19: qty 46

## 2020-11-19 MED ORDER — SODIUM CHLORIDE 0.9 % IV SOLN
10.0000 mg | Freq: Once | INTRAVENOUS | Status: AC
  Administered 2020-11-19: 10 mg via INTRAVENOUS
  Filled 2020-11-19: qty 10

## 2020-11-19 MED ORDER — AMOXICILLIN-POT CLAVULANATE 875-125 MG PO TABS: 1.0000 | ORAL_TABLET | Freq: Two times a day (BID) | ORAL | 0 refills | Status: DC

## 2020-11-19 MED ORDER — HEPARIN SOD (PORK) LOCK FLUSH 100 UNIT/ML IV SOLN
500.0000 [IU] | Freq: Once | INTRAVENOUS | Status: AC
  Administered 2020-11-19: 500 [IU] via INTRAVENOUS
  Filled 2020-11-19: qty 5

## 2020-11-19 NOTE — Telephone Encounter (Signed)
I called the pt and got voicemail at her home phone. I left message that when she left we did not have the u/s scheduled. The appt is 2/22 at 9:20 at [facility] breast. When she comes in next appt. It will show up as well as it is in my chart. She can call if she has questions

## 2020-11-19 NOTE — Addendum Note (Signed)
Addended by: Tor Netters I on: 11/19/2020 11:11 AM   Modules accepted: Orders

## 2020-11-19 NOTE — Progress Notes (Signed)
1335- Patient tolerated treatment well. Patient stable and discharged to home at this time.

## 2020-11-20 ENCOUNTER — Other Ambulatory Visit: Payer: Self-pay | Admitting: Oncology

## 2020-11-20 DIAGNOSIS — C50412 Malignant neoplasm of upper-outer quadrant of left female breast: Secondary | ICD-10-CM

## 2020-11-21 NOTE — Progress Notes (Signed)
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 She then noticed that her lump is gradually getting larger with an area of ulceration on the skin and underwent a diagnostic bilateral mammogram on 09/08/2020 which showed hypoechoic interconnected masses in the left breast from 10:00 to 2 o'clock position spanning at least an area of 4.8 cm. Ultrasound also showed 5 abnormal lymph nodes in the axilla with cortical thickening. Both the mass and the lymph nodes were biopsied and was consistent with invasive mammary carcinoma grade 2. Tumor was ER +91 200%, PR +11 to 20% and HER-2 negative.Ki-67 15%.Patient will be meeting [name] from [facility] surgery to discuss surgical management. She is here for medical oncology recommendations. In terms of her general health patient is otherwise doing well and does not have significant comorbidities. She does endorse significant pain in the area of her left breast. Tylenol and Motrin has not been helping her with this pain.She lives with her husband and 2 children who have special needs. No prior history of abnormal breast mammograms or breast biopsies.  PET CT scan showed hypermetabolism in the area of the left breast but no evidence  of hypermetabolism in the left axillaOr evidence of distant metastatic disease.  Neoadjuvant dose dense ACT chemotherapy started on 10/08/2020    Interval history-reports that she had symptoms of burning urination and was positive for UTI.  She was given a course of Augmentin for 7 days by her PCP.  Reports that her symptoms still persist and she continues to have burning urination as well as frequency.  Her urine culture did show Enterococcus which was sensitive to Augmentin which was tested about 7 days ago.  She gave another urine sample yesterday.  Urine culture was pending but sample was still positive for UTI.  Denies any fever.  Reports ongoing fatigue  ECOG PS- 1 Pain scale- 0   Review of systems- Review of Systems  Constitutional: Positive for malaise/fatigue. Negative for chills, fever and weight loss.  HENT: Negative for congestion, ear discharge and nosebleeds.   Eyes: Negative for blurred vision.  Respiratory: Negative for cough, hemoptysis, sputum production, shortness of breath and wheezing.   Cardiovascular: Negative for chest pain, palpitations, orthopnea and claudication.  Gastrointestinal: Negative for abdominal pain, blood in stool, constipation, diarrhea, heartburn, melena, nausea and vomiting.  Genitourinary: Positive for dysuria and urgency. Negative for flank pain, frequency and hematuria.  Musculoskeletal: Negative for back pain, joint pain and myalgias.  Skin: Negative for rash.  Neurological: Negative for dizziness, tingling, focal weakness, seizures, weakness and headaches.  Endo/Heme/Allergies: Does not bruise/bleed easily.  Psychiatric/Behavioral: Negative for depression and suicidal ideas. The patient does not have insomnia.      Allergies  Allergen Reactions  . Morphine Other (See Comments)    migranes  .  Sertraline Hcl     REACTION: Worsened symptoms of IBS  . Sulfamethoxazole Rash  . Sulfonamide Derivatives Rash     Past Medical History:   Diagnosis Date  . Anxiety   . Breast cancer ([place])   . Diverticulitis   . Family history of ovarian cancer   . GERD (gastroesophageal reflux disease)   . IBS (irritable bowel syndrome)   . Scoliosis      Past Surgical History:  Procedure Laterality Date  . ABDOMINAL HYSTERECTOMY     still has ovaries, no gyn cancer, hysterectomy due to endometriosis.   Marland Kitchen BREAST BIOPSY Left 09/15/2020   [place] bx of mass, path pending, Q marker  . BREAST BIOPSY Left 09/15/2020   [place] bx of LN, hydromarker, path pending  . IR IMAGING GUIDED PORT INSERTION  10/01/2020    Social History   Socioeconomic History  . Marital status: Divorced    Spouse name: Not on file  . Number of children: 2  . Years of education: Not on file  . Highest education level: Not on file  Occupational History  . Occupation: Optician, dispensing:   Tobacco Use  . Smoking status: Current Every Day Smoker  . Smokeless tobacco: Never Used  Vaping Use  . Vaping Use: Not on file  Substance and Sexual Activity  . Alcohol use: Not Currently  . Drug use: No  . Sexual activity: Not Currently    Birth control/protection: None  Other Topics Concern  . 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 She and her spouse are primary caregivers.    Social Determinants of Health   Financial Resource Strain: Not on file  Food Insecurity: Not on file  Transportation Needs: Not on file  Physical Activity: Not on file  Stress: Not on file  Social Connections: Not on file  Intimate Partner Violence: Not on file    Family History  Problem Relation Age of Onset  . Hypertension Mother   . Osteoarthritis Mother   . Diverticulitis Mother   . Heart failure Father   . Hypertension Father   . Gout Father   . Diverticulitis Brother   . Ovarian cancer Paternal Grandmother      Current Outpatient Medications:  .  acetaminophen (TYLENOL)  325 MG tablet, Take 650 mg by mouth every 6 (six) hours as needed., Disp: , Rfl:  .  DULoxetine (CYMBALTA) 30 MG capsule, Take one 30 mg tablet by mouth once a day for the first week. Then increase to two 30 mg tablets ( total 84m) by mouth once daily., Disp: 60 capsule, Rfl: 3 .  Lactobacillus (PROBIOTIC ACIDOPHILUS PO), Take 1 capsule by mouth daily., Disp: , Rfl:  .  lidocaine-prilocaine (EMLA) cream, Apply to affected area once, Disp: 30 g, Rfl: 3 .  LORazepam (ATIVAN) 0.5 MG tablet, Take 1 tablet (0.5 mg total) by mouth every 6 (six) hours as needed (Nausea or vomiting)., Disp: 30 tablet, Rfl: 0 .  Multiple Vitamins-Minerals (MULTIVITAL PO), Take 1 Dose by mouth daily., Disp: , Rfl:  .  nicotine (NICODERM CQ - DOSED IN MG/24 HOURS) 21 mg/24hr patch, Place 1 patch (21 mg total) onto the skin daily., Disp: 28 patch, Rfl: 0 .  ondansetron (ZOFRAN) 8 MG tablet, Take 1 tablet (8 mg total) by mouth 2 (two) times daily as needed. Start on the third day after  chemotherapy., Disp: 30 tablet, Rfl: 1 .  oxyCODONE (OXY IR/ROXICODONE) 5 MG immediate release tablet, Take 1 tablet (5 mg total) by mouth every 6 (six) hours as needed for severe pain., Disp: 120 tablet, Rfl: 0 .  prochlorperazine (COMPAZINE) 10 MG tablet, Take 1 tablet (10 mg total) by mouth every 6 (six) hours as needed (Nausea or vomiting)., Disp: 30 tablet, Rfl: 1 .  amoxicillin-clavulanate (AUGMENTIN) 875-125 MG tablet, Take 1 tablet by mouth 2 (two) times daily., Disp: 14 tablet, Rfl: 0 .  cyclobenzaprine (FLEXERIL) 10 MG tablet, Take 1 tablet (10 mg total) by mouth 3 (three) times daily as needed for muscle spasms. (Patient not taking: No sig reported), Disp: 30 tablet, Rfl: 0 .  nicotine (NICOTROL) 10 MG inhaler, Inhale 1 Cartridge (1 continuous puffing total) into the lungs as needed for smoking cessation. (Patient not taking: Reported on 11/19/2020), Disp: 42 each, Rfl: 0 .  nicotine polacrilex (NICOTINE MINI) 4 MG lozenge, Take 1 lozenge  (4 mg total) by mouth as needed for smoking cessation. (Patient not taking: Reported on 11/19/2020), Disp: 100 tablet, Rfl: 0 .  nitrofurantoin, macrocrystal-monohydrate, (MACROBID) 100 MG capsule, Take 1 capsule (100 mg total) by mouth 2 (two) times daily. Take with food., Disp: 10 capsule, Rfl: 0  Physical exam:  Vitals:   11/19/20 1006  BP: 105/87  Pulse: (!) 102  Temp: (!) 96.4 F (35.8 C)  TempSrc: Tympanic  SpO2: 98%  Weight: 117 lb 5 oz (53.2 kg)   Physical Exam Constitutional:      General: She is not in acute distress. Eyes:     Extraocular Movements: EOM normal.     Pupils: Pupils are equal, round, and reactive to light.  Cardiovascular:     Rate and Rhythm: Normal rate and regular rhythm.     Heart sounds: Normal heart sounds.  Pulmonary:     Effort: Pulmonary effort is normal.     Breath sounds: Normal breath sounds.  Abdominal:     General: Bowel sounds are normal.     Palpations: Abdomen is soft.  Skin:    General: Skin is warm and dry.  Neurological:     Mental Status: She is alert and oriented to person, place, and time.   Breast exam: Left breast mass roughly 4 cm in size.  Appears possibly slightly reduced as compared to prior measurements  CMP Latest Ref Rng & Units 11/19/2020  Glucose 70 - 99 mg/dL 134(H)  BUN 6 - 20 mg/dL 16  Creatinine 0.44 - 1.00 mg/dL 0.57  Sodium 135 - 145 mmol/L 134(L)  Potassium 3.5 - 5.1 mmol/L 4.0  Chloride 98 - 111 mmol/L 99  CO2 22 - 32 mmol/L 23  Calcium 8.9 - 10.3 mg/dL 9.1  Total Protein 6.5 - 8.1 g/dL 7.5  Total Bilirubin 0.3 - 1.2 mg/dL 0.3  Alkaline Phos 38 - 126 U/L 69  AST 15 - 41 U/L 22  ALT 0 - 44 U/L 25   CBC Latest Ref Rng & Units 11/19/2020  WBC 4.0 - 10.5 K/uL 10.3  Hemoglobin 12.0 - 15.0 g/dL 11.1(L)  Hematocrit 36.0 - 46.0 % 33.4(L)  Platelets 150 - 400 K/uL 174      Assessment and plan- Patient is a 50 y.o. female withinvasive mammary carcinoma of the left breast anatomical stage III Bc4N 1M0  ER/PR positive and HER-2 negative.   She is here for on treatment assessment prior to cycle 4 of dose dense AC chemotherapy  Counts okay to  proceed with Cycle 4 of dose dense AC chemotherapy with on for Neulasta support.  Will get interim ultrasound in 1 week and I will see her back in 2 weeks for cycle 1 of weekly Taxol chemotherapy  UTI: Given the patient is actively receiving chemotherapy and has symptoms of UTI with a positive sample yesterday I would like to continue her on Augmentin for another 7 days given that her previous urine culture was sensitive to ampicillin.  Reassess symptoms in 2 weeks   Visit Diagnosis 1. Encounter for antineoplastic chemotherapy   2. Acute cystitis without hematuria   3. Malignant neoplasm of upper-outer quadrant of left breast in female, estrogen receptor positive ([place])      Dr. Randa Evens, MD, MPH [facility] [phone] 11/21/2020 9:55 AM

## 2020-11-22 ENCOUNTER — Other Ambulatory Visit: Payer: Self-pay | Admitting: Family

## 2020-11-22 ENCOUNTER — Ambulatory Visit: Payer: Self-pay | Admitting: Licensed Clinical Social Worker

## 2020-11-22 ENCOUNTER — Encounter: Payer: Self-pay | Admitting: Licensed Clinical Social Worker

## 2020-11-22 DIAGNOSIS — Z8041 Family history of malignant neoplasm of ovary: Secondary | ICD-10-CM

## 2020-11-22 DIAGNOSIS — Z170 Estrogen receptor positive status [ER+]: Secondary | ICD-10-CM

## 2020-11-22 DIAGNOSIS — C50412 Malignant neoplasm of upper-outer quadrant of left female breast: Secondary | ICD-10-CM

## 2020-11-22 DIAGNOSIS — Z1379 Encounter for other screening for genetic and chromosomal anomalies: Secondary | ICD-10-CM

## 2020-11-22 DIAGNOSIS — R319 Hematuria, unspecified: Secondary | ICD-10-CM

## 2020-11-22 LAB — URINE CULTURE
MICRO NUMBER:: 11470073
SPECIMEN QUALITY:: ADEQUATE

## 2020-11-22 MED ORDER — LORAZEPAM 0.5 MG PO TABS: 0.5000 mg | ORAL_TABLET | Freq: Four times a day (QID) | ORAL | 0 refills | Status: DC | PRN

## 2020-11-22 NOTE — Progress Notes (Signed)
HPI:  Ms. Meisinger was previously seen in the [place] clinic due to a personal and family history of cancer and concerns regarding a hereditary predisposition to cancer. Please refer to our prior cancer genetics clinic note for more information regarding our discussion, assessment and recommendations, at the time. Ms. Albertsen's recent genetic test results were disclosed to her, as were recommendations warranted by these results. These results and recommendations are discussed in more detail below.  CANCER HISTORY:  Oncology History  Malignant neoplasm of upper-outer quadrant of left breast in female, estrogen receptor positive ([place])  09/26/2020 Initial Diagnosis   Malignant neoplasm of upper-outer quadrant of left breast in female, estrogen receptor positive ([facility])   10/08/2020 -  Chemotherapy    Patient is on Treatment Plan: BREAST ADJUVANT DOSE DENSE AC Q14D / PACLITAXEL Q7D      10/08/2020 Cancer Staging   Staging form: Breast, AJCC 8th Edition - Clinical stage from 10/08/2020: Stage IIIB (cT4, cN1, cM0, G2, ER+, PR+, HER2-) - Signed by Sindy Guadeloupe, MD on 10/08/2020    Genetic Testing   Negative genetic testing. No pathogenic variants identified on the [facility] Common Hereditary Cancers Panel. The report date is 11/18/2020.  The Multi-Cancer Panel offered by [facility] includes sequencing and/or deletion duplication testing of the following 84 genes: AIP, ALK, APC, ATM, AXIN2,BAP1,  BARD1, BLM, BMPR1A, BRCA1, BRCA2, BRIP1, CASR, CDC73, CDH1, CDK4, CDKN1B, CDKN1C, CDKN2A (p14ARF), CDKN2A (p16INK4a), CEBPA, CHEK2, CTNNA1, DICER1, DIS3L2, EGFR (c.2369C>T, p.Thr790Met variant only), EPCAM (Deletion/duplication testing only), FH, FLCN, GATA2, GPC3, GREM1 (Promoter region deletion/duplication testing only), HOXB13 (c.251G>A, p.Gly84Glu), HRAS, KIT, MAX, MEN1, MET, MITF (c.952G>A, p.Glu318Lys variant only), MLH1, MSH2, MSH3, MSH6, MUTYH, NBN, NF1, NF2, NTHL1, PALB2, PDGFRA, PHOX2B,  PMS2, POLD1, POLE, POT1, PRKAR1A, PTCH1, PTEN, RAD50, RAD51C, RAD51D, RB1, RECQL4, RET,  RUNX1, SDHAF2, SDHA (sequence changes only), SDHB, SDHC, SDHD, SMAD4, SMARCA4, SMARCB1, SMARCE1, STK11, SUFU, TERC, TERT, TMEM127, TP53, TSC1, TSC2, VHL, WRN and WT1.      FAMILY HISTORY:  We obtained a detailed, 4-generation family history.  Significant diagnoses are listed below: Family History  Problem Relation Age of Onset  . Hypertension Mother   . Osteoarthritis Mother   . Diverticulitis Mother   . Heart failure Father   . Hypertension Father   . Gout Father   . Diverticulitis Brother   . Ovarian cancer Paternal Grandmother     Ms. Grim has 2 sons. She has 1 brother.   Ms. Mcqueen's mother is living at 66. No known cancers on this side of the family.   Ms. Kerwood's father died in his late 26s. His mother had ovarian cancer, unknown age of diagnosis/death. No other known cancers on this side of the family.   Ms. Ales is unaware of previous family history of genetic testing for hereditary cancer risks. Patient's maternal ancestors are of [place] descent, and paternal ancestors are of [place] descent. There is no reported Ashkenazi Jewish ancestry. There is no known consanguinity.    GENETIC TEST RESULTS: Genetic testing reported out on 11/18/2020 through the [facility] Multi- cancer panel found no pathogenic mutations.   The Multi-Cancer Panel offered by [facility] includes sequencing and/or deletion duplication testing of the following 84 genes: AIP, ALK, APC, ATM, AXIN2,BAP1,  BARD1, BLM, BMPR1A, BRCA1, BRCA2, BRIP1, CASR, CDC73, CDH1, CDK4, CDKN1B, CDKN1C, CDKN2A (p14ARF), CDKN2A (p16INK4a), CEBPA, CHEK2, CTNNA1, DICER1, DIS3L2, EGFR (c.2369C>T, p.Thr790Met variant only), EPCAM (Deletion/duplication testing only), FH, FLCN, GATA2, GPC3, GREM1 (Promoter region deletion/duplication testing only), HOXB13 (c.251G>A,  p.Gly84Glu), HRAS, KIT, MAX, MEN1, MET, MITF (c.952G>A, p.Glu318Lys variant  only), MLH1, MSH2, MSH3, MSH6, MUTYH, NBN, NF1, NF2, NTHL1, PALB2, PDGFRA, PHOX2B, PMS2, POLD1, POLE, POT1, PRKAR1A, PTCH1, PTEN, RAD50, RAD51C, RAD51D, RB1, RECQL4, RET, RUNX1, SDHAF2, SDHA (sequence changes only), SDHB, SDHC, SDHD, SMAD4, SMARCA4, SMARCB1, SMARCE1, STK11, SUFU, TERC, TERT, TMEM127, TP53, TSC1, TSC2, VHL, WRN and WT1.  .   The test report has been scanned into EPIC and is located under the Molecular Pathology section of the Results Review tab.  A portion of the result report is included below for reference.     We discussed with Ms. Polanco that because current genetic testing is not perfect, it is possible there may be a gene mutation in one of these genes that current testing cannot detect, but that chance is small.  We also discussed, that there could be another gene that has not yet been discovered, or that we have not yet tested, that is responsible for the cancer diagnoses in the family. It is also possible there is a hereditary cause for the cancer in the family that Ms. Polinsky did not inherit and therefore was not identified in her testing.  Therefore, it is important to remain in touch with cancer genetics in the future so that we can continue to offer Ms. Locy the most up to date genetic testing.   ADDITIONAL GENETIC TESTING: We discussed with Ms. Riveron that her genetic testing was fairly extensive.  If there are genes identified to increase cancer risk that can be analyzed in the future, we would be happy to discuss and coordinate this testing at that time.    CANCER SCREENING RECOMMENDATIONS: Ms. Zaccone's test result is considered negative (normal).  This means that we have not identified a hereditary cause for her  personal and family history of cancer at this time. Most cancers happen by chance and this negative test suggests that her cancer may fall into this category.    While reassuring, this does not definitively rule out a hereditary predisposition to  cancer. It is still possible that there could be genetic mutations that are undetectable by current technology. There could be genetic mutations in genes that have not been tested or identified to increase cancer risk.  Therefore, it is recommended she continue to follow the cancer management and screening guidelines provided by her oncology and primary healthcare provider.   An individual's cancer risk and medical management are not determined by genetic test results alone. Overall cancer risk assessment incorporates additional factors, including personal medical history, family history, and any available genetic information that may result in a personalized plan for cancer prevention and surveillance.  RECOMMENDATIONS FOR FAMILY MEMBERS:  Relatives in this family might be at some increased risk of developing cancer, over the general population risk, simply due to the family history of cancer.  We recommended female relatives in this family have a yearly mammogram beginning at age 64, or 33 years younger than the earliest onset of cancer, an annual clinical breast exam, and perform monthly breast self-exams. Female relatives in this family should also have a gynecological exam as recommended by their primary provider.  All family members should be referred for colonoscopy starting at age 20.   It is also possible there is a hereditary cause for the cancer in Ms. Urschel's family that she did not inherit and therefore was not identified in her.  Based on Ms. Melland's family history, we recommended paternal relatives have genetic counseling and testing.  Ms. Wiswell will let us know if we can be of any assistance in coordinating genetic counseling and/or testing for these family members.  FOLLOW-UP: Lastly, we discussed with Ms. Focht that cancer genetics is a rapidly advancing field and it is possible that new genetic tests will be appropriate for her and/or her family members in the future. We  encouraged her to remain in contact with cancer genetics on an annual basis so we can update her personal and family histories and let her know of advances in cancer genetics that may benefit this family.   Our contact number was provided. Ms. Adamson's questions were answered to her satisfaction, and she knows she is welcome to call us at anytime with additional questions or concerns.   Faith Rogue, MS, [facility] Genetic Counselor [email or web address] Phone: [phone]

## 2020-11-22 NOTE — Telephone Encounter (Signed)
Revealed negative genetic testing. This normal result is reassuring and indicates that it is unlikely Tricia Berry's cancer is due to a hereditary cause.  It is unlikely that there is an increased risk of another cancer due to a mutation in one of these genes.  However, genetic testing is not perfect, and cannot definitively rule out a hereditary cause.  It will be important for her to keep in contact with genetics to learn if any additional testing may be needed in the future.

## 2020-11-26 ENCOUNTER — Telehealth: Payer: Self-pay | Admitting: *Deleted

## 2020-11-26 NOTE — Telephone Encounter (Signed)
Called and spoke to pt and told her that she has finsihed the 4 cycles of AC and now she will start the taxol. The scheduler did not change her treatment to taxol and the first time it is a long treatment and therefore we need to move the appt earlier to be able to make enough time to block out for the treatment. Pt is agreeable to coming 8:15 for port labs and see md and tx.

## 2020-11-29 ENCOUNTER — Inpatient Hospital Stay: Payer: No Typology Code available for payment source | Attending: Oncology

## 2020-11-29 DIAGNOSIS — Z79899 Other long term (current) drug therapy: Secondary | ICD-10-CM | POA: Insufficient documentation

## 2020-11-29 DIAGNOSIS — Z5111 Encounter for antineoplastic chemotherapy: Secondary | ICD-10-CM | POA: Insufficient documentation

## 2020-11-29 DIAGNOSIS — C50412 Malignant neoplasm of upper-outer quadrant of left female breast: Secondary | ICD-10-CM | POA: Insufficient documentation

## 2020-11-29 NOTE — Progress Notes (Signed)
Nutrition Follow-up:  Patient with left breast cancer. Has completed AC and planning to start taxol on 2/11.  Followed by Dr Janese Banks.    Spoke with patient via phone for nutrition follow-up.  Patient reports that her appetite has improved from when first spoke.  Still first few days after treatment decreased appetite. Feels that she is snacking more.  Has been drinking boost high protein shakes or boost max protein shakes, sometimes 2 a day.  Usually has applesauce and boost shake for breakfast. Breakfast has been the hardest to try and eat because she never has been a breakfast eater.  Supper last night was chili (meat and beans) with cornbread and cheese.  Drank boost shake before bed.  Can't remember lunch but snacked on goldfish, M&Ms.  Peanut butter does not agree with her.     Medications: reviewed  Labs: reviewed  Anthropometrics:   Weight 117 lb on 1/28 stable  118 lb on 1/10  123 lb when started treatment   NUTRITION DIAGNOSIS: Inadequate oral intake improving   INTERVENTION:  Reviewed importance of adding calories to diet to prevent weight loss. Discussed ways to add calories. Encouraged higher calorie shake, if able to tolerate.  Brainstormed different breakfast ideas.    MONITORING, EVALUATION, GOAL: weight trends, intake   NEXT VISIT: Mar 7 phone call  Kendre Sires B. Zenia Resides, [place], [place] Registered Dietitian [phone] (mobile)

## 2020-11-30 ENCOUNTER — Other Ambulatory Visit: Payer: Self-pay

## 2020-11-30 ENCOUNTER — Encounter: Payer: No Typology Code available for payment source | Attending: Family | Admitting: *Deleted

## 2020-11-30 ENCOUNTER — Other Ambulatory Visit: Payer: No Typology Code available for payment source

## 2020-11-30 DIAGNOSIS — Z170 Estrogen receptor positive status [ER+]: Secondary | ICD-10-CM | POA: Insufficient documentation

## 2020-11-30 DIAGNOSIS — C50412 Malignant neoplasm of upper-outer quadrant of left female breast: Secondary | ICD-10-CM | POA: Insufficient documentation

## 2020-11-30 NOTE — Progress Notes (Signed)
Daily Session Note  Patient Details  Name: Tricia Berry MRN: [id number] Date of Birth: 01-15-1971 Referring Provider:    Encounter Date: 11/30/2020  Check In:  Session Check In - 11/30/20 1239      Check-In   Supervising physician immediately available to respond to emergencies See telemetry face sheet for immediately available ER MD    Location [facility]    Staff Present Birdie Sons, MPA, RN;Chaslyn Eisen Luan Pulling, MA, RCEP, CCRP, CCET    Virtual Visit No    Medication changes reported     No    Fall or balance concerns reported    No    Tobacco Cessation Use Decreased    Current number of cigarettes/nicotine per day     9    Warm-up and Cool-down Performed on first and last piece of equipment    Resistance Training Performed Yes    VAD Patient? No    PAD/SET Patient? No      Pain Assessment   Currently in Pain? No/denies              Social History   Tobacco Use  Smoking Status Current Every Day Smoker  Smokeless Tobacco Never Used    Goals Met:  Proper associated with RPD/PD & O2 Sat Independence with exercise equipment Exercise tolerated well No report of cardiac concerns or symptoms Strength training completed today  Goals Unmet:  Not Applicable  Comments: Pt able to follow exercise prescription today without complaint.  Will continue to monitor for progression.    Dr. Emily Filbert is Medical Director for [place] and [facility].

## 2020-12-02 ENCOUNTER — Other Ambulatory Visit: Payer: Self-pay

## 2020-12-02 DIAGNOSIS — C50412 Malignant neoplasm of upper-outer quadrant of left female breast: Secondary | ICD-10-CM

## 2020-12-02 DIAGNOSIS — Z170 Estrogen receptor positive status [ER+]: Secondary | ICD-10-CM

## 2020-12-02 NOTE — Progress Notes (Signed)
Daily Session Note  Patient Details  Name: Tricia Berry MRN: [id number] Date of Birth: December 02, 1970 Referring Provider:    Encounter Date: 12/02/2020  Check In:  Session Check In - 12/02/20 1421      Check-In   Supervising physician immediately available to respond to emergencies See telemetry face sheet for immediately available ER MD    Location [facility]    Staff Present Coralie Keens, MS Exercise Physiologist;Amanda Oletta Darter, [place], ACSM CEP, Exercise Physiologist    Virtual Visit No    Medication changes reported     No    Fall or balance concerns reported    No    Tobacco Cessation No Change    Current number of cigarettes/nicotine per day     9    Warm-up and Cool-down Performed on first and last piece of equipment    Resistance Training Performed Yes    VAD Patient? No             Social History   Tobacco Use  Smoking Status Current Every Day Smoker  Smokeless Tobacco Never Used    Goals Met:  Exercise tolerated well Queuing for purse lip breathing No report of cardiac concerns or symptoms  Goals Unmet:  Not Applicable  Comments: Pt able to follow exercise prescription today without complaint.  Will continue to monitor for progression.  Today's session included Yoga, led by Exercise Physiologist, Nada Maclachlan.   Dr. Emily Filbert is Medical Director for [facility] and [facility].

## 2020-12-03 ENCOUNTER — Inpatient Hospital Stay: Payer: No Typology Code available for payment source

## 2020-12-03 ENCOUNTER — Inpatient Hospital Stay (HOSPITAL_BASED_OUTPATIENT_CLINIC_OR_DEPARTMENT_OTHER): Payer: No Typology Code available for payment source | Admitting: Oncology

## 2020-12-03 ENCOUNTER — Ambulatory Visit: Payer: No Typology Code available for payment source

## 2020-12-03 ENCOUNTER — Other Ambulatory Visit: Payer: No Typology Code available for payment source

## 2020-12-03 ENCOUNTER — Ambulatory Visit: Payer: No Typology Code available for payment source | Admitting: Oncology

## 2020-12-03 ENCOUNTER — Encounter: Payer: Self-pay | Admitting: Oncology

## 2020-12-03 VITALS — BP 135/78 | HR 89 | Temp 97.2°F | Resp 16

## 2020-12-03 VITALS — BP 120/81 | HR 81 | Temp 96.9°F | Wt 119.0 lb

## 2020-12-03 DIAGNOSIS — C50412 Malignant neoplasm of upper-outer quadrant of left female breast: Secondary | ICD-10-CM

## 2020-12-03 DIAGNOSIS — D6481 Anemia due to antineoplastic chemotherapy: Secondary | ICD-10-CM

## 2020-12-03 DIAGNOSIS — Z170 Estrogen receptor positive status [ER+]: Secondary | ICD-10-CM

## 2020-12-03 DIAGNOSIS — Z5111 Encounter for antineoplastic chemotherapy: Secondary | ICD-10-CM | POA: Diagnosis not present

## 2020-12-03 DIAGNOSIS — G893 Neoplasm related pain (acute) (chronic): Secondary | ICD-10-CM

## 2020-12-03 DIAGNOSIS — T451X5A Adverse effect of antineoplastic and immunosuppressive drugs, initial encounter: Secondary | ICD-10-CM

## 2020-12-03 DIAGNOSIS — Z79899 Other long term (current) drug therapy: Secondary | ICD-10-CM | POA: Diagnosis not present

## 2020-12-03 LAB — CBC WITH DIFFERENTIAL/PLATELET
Abs Immature Granulocytes: 0.51 10*3/uL — ABNORMAL HIGH (ref 0.00–0.07)
Basophils Absolute: 0.1 10*3/uL (ref 0.0–0.1)
Basophils Relative: 1 %
Eosinophils Absolute: 0 10*3/uL (ref 0.0–0.5)
Eosinophils Relative: 0 %
HCT: 31.2 % — ABNORMAL LOW (ref 36.0–46.0)
Hemoglobin: 10.3 g/dL — ABNORMAL LOW (ref 12.0–15.0)
Immature Granulocytes: 7 %
Lymphocytes Relative: 20 %
Lymphs Abs: 1.6 10*3/uL (ref 0.7–4.0)
MCH: 32.4 pg (ref 26.0–34.0)
MCHC: 33 g/dL (ref 30.0–36.0)
MCV: 98.1 fL (ref 80.0–100.0)
Monocytes Absolute: 0.9 10*3/uL (ref 0.1–1.0)
Monocytes Relative: 11 %
Neutro Abs: 4.8 10*3/uL (ref 1.7–7.7)
Neutrophils Relative %: 61 %
Platelets: 227 10*3/uL (ref 150–400)
RBC: 3.18 MIL/uL — ABNORMAL LOW (ref 3.87–5.11)
RDW: 19.5 % — ABNORMAL HIGH (ref 11.5–15.5)
Smear Review: NORMAL
WBC: 7.9 10*3/uL (ref 4.0–10.5)
nRBC: 0.4 % — ABNORMAL HIGH (ref 0.0–0.2)

## 2020-12-03 LAB — COMPREHENSIVE METABOLIC PANEL
ALT: 22 U/L (ref 0–44)
AST: 23 U/L (ref 15–41)
Albumin: 3.7 g/dL (ref 3.5–5.0)
Alkaline Phosphatase: 66 U/L (ref 38–126)
Anion gap: 12 (ref 5–15)
BUN: 9 mg/dL (ref 6–20)
CO2: 23 mmol/L (ref 22–32)
Calcium: 9.2 mg/dL (ref 8.9–10.3)
Chloride: 103 mmol/L (ref 98–111)
Creatinine, Ser: 0.64 mg/dL (ref 0.44–1.00)
GFR, Estimated: >60 mL/min (ref >60)
Glucose, Bld: 143 mg/dL — ABNORMAL HIGH (ref 70–99)
Potassium: 3.7 mmol/L (ref 3.5–5.1)
Sodium: 138 mmol/L (ref 135–145)
Total Bilirubin: 0.4 mg/dL (ref 0.3–1.2)
Total Protein: 6.7 g/dL (ref 6.5–8.1)

## 2020-12-03 MED ORDER — SODIUM CHLORIDE 0.9 % IV SOLN
80.0000 mg/m2 | Freq: Once | INTRAVENOUS | Status: AC
  Administered 2020-12-03: 120 mg via INTRAVENOUS
  Filled 2020-12-03: qty 20

## 2020-12-03 MED ORDER — DIPHENHYDRAMINE HCL 50 MG/ML IJ SOLN
50.0000 mg | Freq: Once | INTRAMUSCULAR | Status: AC
  Administered 2020-12-03: 50 mg via INTRAVENOUS
  Filled 2020-12-03: qty 1

## 2020-12-03 MED ORDER — SODIUM CHLORIDE 0.9% FLUSH
10.0000 mL | INTRAVENOUS | Status: DC | PRN
  Administered 2020-12-03: 10 mL via INTRAVENOUS
  Filled 2020-12-03: qty 10

## 2020-12-03 MED ORDER — SODIUM CHLORIDE 0.9 % IV SOLN
20.0000 mg | Freq: Once | INTRAVENOUS | Status: AC
  Administered 2020-12-03: 20 mg via INTRAVENOUS
  Filled 2020-12-03: qty 20

## 2020-12-03 MED ORDER — HEPARIN SOD (PORK) LOCK FLUSH 100 UNIT/ML IV SOLN
500.0000 [IU] | Freq: Once | INTRAVENOUS | Status: AC
  Administered 2020-12-03: 500 [IU] via INTRAVENOUS
  Filled 2020-12-03: qty 5

## 2020-12-03 MED ORDER — HEPARIN SOD (PORK) LOCK FLUSH 100 UNIT/ML IV SOLN
500.0000 [IU] | Freq: Once | INTRAVENOUS | Status: DC | PRN
  Filled 2020-12-03: qty 5

## 2020-12-03 MED ORDER — SODIUM CHLORIDE 0.9 % IV SOLN
Freq: Once | INTRAVENOUS | Status: AC
  Filled 2020-12-03: qty 250

## 2020-12-03 MED ORDER — FAMOTIDINE IN NACL 20-0.9 MG/50ML-% IV SOLN
20.0000 mg | Freq: Once | INTRAVENOUS | Status: AC
  Administered 2020-12-03: 20 mg via INTRAVENOUS
  Filled 2020-12-03: qty 50

## 2020-12-03 MED ORDER — HEPARIN SOD (PORK) LOCK FLUSH 100 UNIT/ML IV SOLN
INTRAVENOUS | Status: AC
  Filled 2020-12-03: qty 5

## 2020-12-03 NOTE — Progress Notes (Signed)
Taxol only today per MD.  Norma Fredrickson to be deleted from plan.

## 2020-12-03 NOTE — Progress Notes (Signed)
Mrs. Lampron tolerated her first Taxol treatment well today without any complications. Taxol titrated as ordered and patient vital signs remained stable throughout the infusion.

## 2020-12-03 NOTE — Progress Notes (Addendum)
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 She then noticed that her lump is gradually getting larger with an area of ulceration on the skin and underwent a diagnostic bilateral mammogram on 09/08/2020 which showed hypoechoic interconnected masses in the left breast from 10:00 to 2 o'clock position spanning at least an area of 4.8 cm. Ultrasound also showed 5 abnormal lymph nodes in the axilla with cortical thickening. Both the mass and the lymph nodes were biopsied and was consistent with invasive mammary carcinoma grade 2. Tumor was ER +91 200%, PR +11 to 20% and HER-2 negative.Ki-67 15%.Patient will be meeting [name] from [facility] surgery to discuss surgical management. She is here for medical oncology recommendations. In terms of her general health patient is otherwise doing well and does not have significant comorbidities. She does endorse significant pain in the area of her left breast. Tylenol and Motrin has not been helping her with this pain.She lives with her husband and 2 children who have special needs. No prior history of abnormal breast mammograms or breast biopsies.  PET CT scan showed hypermetabolism in the area of the left breast but no evidence  of hypermetabolism in the left axillaOr evidence of distant metastatic disease.  Neoadjuvant dose dense ACT chemotherapy started on 10/08/2020   Interval history-patient complains of ongoing fatigue and is not very active presently.  She does report fatigue with moderate activity.  Also reports occasional pain at the breast site.  Denies any urinary complaints today.  ECOG PS- 1 Pain scale- 0 Opioid associated constipation- no  Review of systems- Review of Systems  Constitutional: Positive for malaise/fatigue. Negative for chills, fever and weight loss.  HENT: Negative for congestion, ear discharge and nosebleeds.   Eyes: Negative for blurred vision.  Respiratory: Negative for cough, hemoptysis, sputum production, shortness of breath and wheezing.   Cardiovascular: Negative for chest pain, palpitations, orthopnea and claudication.  Gastrointestinal: Negative for abdominal pain, blood in stool, constipation, diarrhea, heartburn, melena, nausea and vomiting.  Genitourinary: Negative for dysuria, flank pain, frequency, hematuria and urgency.  Musculoskeletal: Negative for back pain, joint pain and myalgias.  Skin: Negative for rash.  Neurological: Negative for dizziness, tingling, focal weakness, seizures, weakness and headaches.  Endo/Heme/Allergies: Does not bruise/bleed easily.  Psychiatric/Behavioral: Negative for depression and suicidal ideas. The patient does not have insomnia.   Breast: Left breast pain   Allergies  Allergen Reactions  . Morphine Other (See Comments)    migranes  . Sertraline Hcl     REACTION: Worsened symptoms of IBS  . Sulfamethoxazole Rash  . Sulfonamide Derivatives Rash     Past Medical History:  Diagnosis Date  . Anxiety   . Breast cancer ([place])   . Diverticulitis   . Family history of ovarian cancer   . GERD (gastroesophageal  reflux disease)   . IBS (irritable bowel syndrome)   . Scoliosis      Past Surgical History:  Procedure  Laterality Date  . ABDOMINAL HYSTERECTOMY     still has ovaries, no gyn cancer, hysterectomy due to endometriosis.   [facility] BREAST BIOPSY Left 09/15/2020   [place] bx of mass, path pending, Q marker  . BREAST BIOPSY Left 09/15/2020   [place] bx of LN, hydromarker, path pending  . IR IMAGING GUIDED PORT INSERTION  10/01/2020    Social History   Socioeconomic History  . Marital status: Divorced    Spouse name: Not on file  . Number of children: 2  . Years of education: Not on file  . Highest education level: Not on file  Occupational History  . Occupation: Optician, dispensing: [employer]  Tobacco Use  . Smoking status: Current Every Day Smoker  . Smokeless tobacco: Never Used  Vaping Use  . Vaping Use: Not on file  Substance and Sexual Activity  . Alcohol use: Not Currently  . Drug use: No  . Sexual activity: Not Currently    Birth control/protection: None  Other Topics Concern  . 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 She and her spouse are primary caregivers.    Social Determinants of Health   Financial Resource Strain: Not on file  Food Insecurity: Not on file  Transportation Needs: Not on file  Physical Activity: Not on file  Stress: Not on file  Social Connections: Not on file  Intimate Partner Violence: Not on file    Family History  Problem Relation Age of Onset  . Hypertension Mother   . Osteoarthritis Mother   . Diverticulitis Mother   . Heart failure Father   . Hypertension Father   . Gout Father   . Diverticulitis Brother   . Ovarian cancer Paternal Grandmother      Current Outpatient Medications:  .  DULoxetine (CYMBALTA) 30 MG capsule, Take one 30 mg tablet by mouth once a day for the first week. Then increase to two 30 mg tablets ( total 17m) by mouth once daily., Disp: 60 capsule, Rfl: 3 .  Lactobacillus (PROBIOTIC ACIDOPHILUS PO), Take 1 capsule by mouth daily.,  Disp: , Rfl:  .  lidocaine-prilocaine (EMLA) cream, Apply to affected area once, Disp: 30 g, Rfl: 3 .  LORazepam (ATIVAN) 0.5 MG tablet, Take 1 tablet (0.5 mg total) by mouth every 6 (six) hours as needed (Nausea or vomiting)., Disp: 30 tablet, Rfl: 0 .  nicotine (NICODERM CQ - DOSED IN MG/24 HOURS) 21 mg/24hr patch, Place 1 patch (21 mg total) onto the skin daily., Disp: 28 patch, Rfl: 0 .  ondansetron (ZOFRAN) 8 MG tablet, Take 1 tablet (8 mg total) by mouth 2 (two) times daily as needed. Start on the third day after chemotherapy., Disp: 30 tablet, Rfl: 1 .  oxyCODONE (OXY IR/ROXICODONE) 5 MG immediate release tablet, Take 1 tablet (5 mg total) by mouth every 6 (six) hours as needed for severe pain., Disp: 120 tablet, Rfl: 0 .  prochlorperazine (COMPAZINE) 10 MG tablet, Take 1 tablet (10 mg total) by mouth every 6 (six) hours as needed (Nausea or vomiting)., Disp: 30 tablet, Rfl: 1 .  acetaminophen (TYLENOL) 325 MG tablet, Take 650 mg by mouth every 6 (six) hours as needed. (Patient not taking: Reported on 12/03/2020), Disp: , Rfl:  .  amoxicillin-clavulanate (AUGMENTIN) 875-125 MG tablet, Take 1 tablet by mouth 2 (two) times daily. (Patient not taking: Reported on 12/03/2020), Disp: 14 tablet, Rfl: 0 .  cyclobenzaprine (FLEXERIL) 10 MG tablet, Take 1 tablet (10 mg total) by mouth 3 (three) times daily as needed for muscle spasms. (Patient not taking: No sig reported), Disp: 30 tablet, Rfl: 0 .  Multiple Vitamins-Minerals (MULTIVITAL PO), Take 1 Dose by mouth daily. (Patient not taking: Reported on 12/03/2020), Disp: , Rfl:  .  nicotine (NICOTROL) 10 MG inhaler, Inhale 1 Cartridge (1 continuous puffing total) into the lungs as needed for smoking cessation. (Patient not taking: No sig reported), Disp: 42 each, Rfl: 0 .  nicotine polacrilex (NICOTINE MINI) 4 MG lozenge, Take 1 lozenge (4 mg total) by mouth as needed for smoking cessation. (Patient not taking: No sig reported), Disp: 100 tablet, Rfl: 0 No  current facility-administered medications for this visit.  Facility-Administered Medications Ordered in Other Visits:  .  heparin lock flush 100 unit/mL, 500 Units, Intravenous, Once, Randa Evens C, MD .  sodium chloride flush (NS) 0.9 % injection 10 mL, 10 mL, Intravenous, PRN, Sindy Guadeloupe, MD, 10 mL at 12/03/20 0834  Physical exam:  Vitals:   12/03/20 0847  BP: 120/81  Pulse: 81  Temp: (!) 96.9 F (36.1 C)  TempSrc: Tympanic  Weight: 119 lb (54 kg)   Physical Exam Constitutional:      General: She is not in acute distress. Eyes:     Extraocular Movements: EOM normal.  Cardiovascular:     Rate and Rhythm: Normal rate and regular rhythm.     Heart sounds: Normal heart sounds.  Pulmonary:     Effort: Pulmonary effort is normal.     Breath sounds: Normal breath sounds.  Skin:    General: Skin is warm and dry.  Neurological:     Mental Status: She is alert and oriented to person, place, and time.      CMP Latest Ref Rng & Units 12/03/2020  Glucose 70 - 99 mg/dL 143(H)  BUN 6 - 20 mg/dL 9  Creatinine 0.44 - 1.00 mg/dL 0.64  Sodium 135 - 145 mmol/L 138  Potassium 3.5 - 5.1 mmol/L 3.7  Chloride 98 - 111 mmol/L 103  CO2 22 - 32 mmol/L 23  Calcium 8.9 - 10.3 mg/dL 9.2  Total Protein 6.5 - 8.1 g/dL 6.7  Total Bilirubin 0.3 - 1.2 mg/dL 0.4  Alkaline Phos 38 - 126 U/L 66  AST 15 - 41 U/L 23  ALT 0 - 44 U/L 22   CBC Latest Ref Rng & Units 12/03/2020  WBC 4.0 - 10.5 K/uL 7.9  Hemoglobin 12.0 - 15.0 g/dL 10.3(L)  Hematocrit 36.0 - 46.0 % 31.2(L)  Platelets 150 - 400 K/uL 227      Assessment and plan- Patient is a 50 y.o. female  withinvasive mammary carcinoma of the left breast anatomical stage III Bc4N 1M0 ER/PR positive and HER-2 negative.   She is here for on treatment assessment prior to cycle 1 of neoadjuvant taxol chemotherapy  Counts okay to proceed with cycle 1 of neoadjuvant weekly carbotaxol chemotherapy today.  She will directly proceed for cycle 2 next  week and I will see her back in 2 weeks for cycle 3.  She has an interim ultrasound of her left breast scheduled on 12/14/2020.  Again discussed risks and benefits of weekly taxol chemotherapy including all but not limited to nausea, vomiting, low blood counts, risk of infusion reaction and  peripheral neuropathy.  Patient understands and agrees to proceed as planned  Cancer associated fatigue: Explained to the patient that she needs to try to be more active physically to counter the fatigue.  Neoplasm related pain: Continue as needed oxycodone  Chemo-induced anemia: Continue to monitor.  We will add ferritin iron studies B12 and folate with the next set of labs.   Visit Diagnosis 1. Malignant neoplasm of upper-outer quadrant of left breast in female, estrogen receptor positive ([place])   2. Encounter for antineoplastic chemotherapy   3. Antineoplastic chemotherapy induced anemia   4. Neoplasm related pain      Dr. Randa Evens, MD, MPH [facility] at [facility] [phone] 12/03/2020 9:11 AM

## 2020-12-06 ENCOUNTER — Other Ambulatory Visit: Payer: Self-pay | Admitting: Oncology

## 2020-12-06 MED ORDER — OXYCODONE HCL 5 MG PO TABS: 5.0000 mg | ORAL_TABLET | Freq: Four times a day (QID) | ORAL | 0 refills | Status: DC | PRN

## 2020-12-07 ENCOUNTER — Other Ambulatory Visit: Payer: Self-pay | Admitting: *Deleted

## 2020-12-07 ENCOUNTER — Encounter: Payer: No Typology Code available for payment source | Admitting: *Deleted

## 2020-12-07 ENCOUNTER — Other Ambulatory Visit: Payer: Self-pay

## 2020-12-07 ENCOUNTER — Encounter: Payer: Self-pay | Admitting: *Deleted

## 2020-12-07 DIAGNOSIS — C50412 Malignant neoplasm of upper-outer quadrant of left female breast: Secondary | ICD-10-CM

## 2020-12-07 DIAGNOSIS — Z170 Estrogen receptor positive status [ER+]: Secondary | ICD-10-CM

## 2020-12-07 MED ORDER — DEXAMETHASONE 4 MG PO TABS: 8.0000 mg | ORAL_TABLET | Freq: Every day | ORAL | 1 refills | Status: DC

## 2020-12-07 NOTE — Progress Notes (Signed)
Daily Session Note  Patient Details  Name: Tricia Berry MRN: [id number] Date of Birth: 06/11/1971 Referring Provider:    Encounter Date: 12/07/2020  Check In:  Session Check In - 12/07/20 1244      Check-In   Supervising physician immediately available to respond to emergencies See telemetry face sheet for immediately available ER MD    Location [facility]    Staff Present Alberteen Sam, MA, RCEP, CCRP, Marylynn Pearson, MS Exercise Physiologist    Virtual Visit No    Medication changes reported     No    Fall or balance concerns reported    No    Current number of cigarettes/nicotine per day     9    Warm-up and Cool-down Performed on first and last piece of equipment    Resistance Training Performed Yes    VAD Patient? No    PAD/SET Patient? No      Pain Assessment   Currently in Pain? No/denies              Social History   Tobacco Use  Smoking Status Current Every Day Smoker  Smokeless Tobacco Never Used    Goals Met:  Proper associated with RPD/PD & O2 Sat Independence with exercise equipment Exercise tolerated well No report of cardiac concerns or symptoms Strength training completed today  Goals Unmet:  Not Applicable  Comments: Pt able to follow exercise prescription today without complaint.  Will continue to monitor for progression.    Dr. Emily Filbert is Medical Director for [place] and [facility].

## 2020-12-08 ENCOUNTER — Other Ambulatory Visit: Payer: Self-pay | Admitting: Oncology

## 2020-12-08 MED ORDER — NICOTINE 21 MG/24HR TD PT24: 21.0000 mg | MEDICATED_PATCH | Freq: Every day | TRANSDERMAL | 0 refills | Status: DC

## 2020-12-09 ENCOUNTER — Other Ambulatory Visit: Payer: Self-pay

## 2020-12-09 DIAGNOSIS — Z170 Estrogen receptor positive status [ER+]: Secondary | ICD-10-CM

## 2020-12-09 DIAGNOSIS — C50412 Malignant neoplasm of upper-outer quadrant of left female breast: Secondary | ICD-10-CM

## 2020-12-09 NOTE — Progress Notes (Signed)
Daily Session Note  Patient Details  Name: Tricia Berry MRN: [id number] Date of Birth: Jan 04, 1971 Referring Provider:    Encounter Date: 12/09/2020  Check In:  Session Check In - 12/09/20 1348      Check-In   Supervising physician immediately available to respond to emergencies See telemetry face sheet for immediately available ER MD    Location [facility]    Staff Present Coralie Keens, MS Exercise Physiologist;Amanda Oletta Darter, [place], ACSM CEP, Exercise Physiologist    Virtual Visit No    Medication changes reported     No    Fall or balance concerns reported    No    Tobacco Cessation No Change    Warm-up and Cool-down Performed on first and last piece of equipment    Resistance Training Performed Yes    VAD Patient? No    PAD/SET Patient? No              Social History   Tobacco Use  Smoking Status Current Every Day Smoker  Smokeless Tobacco Never Used    Goals Met:  Independence with exercise equipment Exercise tolerated well No report of cardiac concerns or symptoms Strength training completed today  Goals Unmet:  Not Applicable  Comments: Pt able to follow exercise prescription today without complaint.  Will continue to monitor for progression.   Dr. Emily Filbert is Medical Director for [facility] and [facility].

## 2020-12-10 ENCOUNTER — Inpatient Hospital Stay: Payer: No Typology Code available for payment source

## 2020-12-10 VITALS — BP 120/80 | HR 92 | Temp 96.8°F | Resp 18 | Wt 118.4 lb

## 2020-12-10 DIAGNOSIS — Z170 Estrogen receptor positive status [ER+]: Secondary | ICD-10-CM

## 2020-12-10 DIAGNOSIS — C50412 Malignant neoplasm of upper-outer quadrant of left female breast: Secondary | ICD-10-CM

## 2020-12-10 DIAGNOSIS — Z5111 Encounter for antineoplastic chemotherapy: Secondary | ICD-10-CM | POA: Diagnosis not present

## 2020-12-10 LAB — CBC WITH DIFFERENTIAL/PLATELET
Abs Immature Granulocytes: 0.12 10*3/uL — ABNORMAL HIGH (ref 0.00–0.07)
Basophils Absolute: 0 10*3/uL (ref 0.0–0.1)
Basophils Relative: 0 %
Eosinophils Absolute: 0.1 10*3/uL (ref 0.0–0.5)
Eosinophils Relative: 0 %
HCT: 30.9 % — ABNORMAL LOW (ref 36.0–46.0)
Hemoglobin: 10.3 g/dL — ABNORMAL LOW (ref 12.0–15.0)
Immature Granulocytes: 1 %
Lymphocytes Relative: 11 %
Lymphs Abs: 1.5 10*3/uL (ref 0.7–4.0)
MCH: 33.3 pg (ref 26.0–34.0)
MCHC: 33.3 g/dL (ref 30.0–36.0)
MCV: 100 fL (ref 80.0–100.0)
Monocytes Absolute: 1 10*3/uL (ref 0.1–1.0)
Monocytes Relative: 8 %
Neutro Abs: 10.5 10*3/uL — ABNORMAL HIGH (ref 1.7–7.7)
Neutrophils Relative %: 80 %
Platelets: 230 10*3/uL (ref 150–400)
RBC: 3.09 MIL/uL — ABNORMAL LOW (ref 3.87–5.11)
RDW: 19.3 % — ABNORMAL HIGH (ref 11.5–15.5)
WBC: 13.2 10*3/uL — ABNORMAL HIGH (ref 4.0–10.5)
nRBC: 0 % (ref 0.0–0.2)

## 2020-12-10 LAB — COMPREHENSIVE METABOLIC PANEL
ALT: 23 U/L (ref 0–44)
AST: 24 U/L (ref 15–41)
Albumin: 3.7 g/dL (ref 3.5–5.0)
Alkaline Phosphatase: 55 U/L (ref 38–126)
Anion gap: 11 (ref 5–15)
BUN: 14 mg/dL (ref 6–20)
CO2: 22 mmol/L (ref 22–32)
Calcium: 8.9 mg/dL (ref 8.9–10.3)
Chloride: 103 mmol/L (ref 98–111)
Creatinine, Ser: 0.59 mg/dL (ref 0.44–1.00)
GFR, Estimated: >60 mL/min (ref >60)
Glucose, Bld: 142 mg/dL — ABNORMAL HIGH (ref 70–99)
Potassium: 3.9 mmol/L (ref 3.5–5.1)
Sodium: 136 mmol/L (ref 135–145)
Total Bilirubin: 0.6 mg/dL (ref 0.3–1.2)
Total Protein: 6.5 g/dL (ref 6.5–8.1)

## 2020-12-10 MED ORDER — DIPHENHYDRAMINE HCL 50 MG/ML IJ SOLN
50.0000 mg | Freq: Once | INTRAMUSCULAR | Status: AC
  Administered 2020-12-10: 50 mg via INTRAVENOUS
  Filled 2020-12-10: qty 1

## 2020-12-10 MED ORDER — SODIUM CHLORIDE 0.9 % IV SOLN
20.0000 mg | Freq: Once | INTRAVENOUS | Status: AC
  Administered 2020-12-10: 20 mg via INTRAVENOUS
  Filled 2020-12-10: qty 20

## 2020-12-10 MED ORDER — FAMOTIDINE IN NACL 20-0.9 MG/50ML-% IV SOLN
20.0000 mg | Freq: Once | INTRAVENOUS | Status: AC
  Administered 2020-12-10: 20 mg via INTRAVENOUS
  Filled 2020-12-10: qty 50

## 2020-12-10 MED ORDER — HEPARIN SOD (PORK) LOCK FLUSH 100 UNIT/ML IV SOLN
500.0000 [IU] | Freq: Once | INTRAVENOUS | Status: AC | PRN
  Administered 2020-12-10: 500 [IU]
  Filled 2020-12-10: qty 5

## 2020-12-10 MED ORDER — HEPARIN SOD (PORK) LOCK FLUSH 100 UNIT/ML IV SOLN
INTRAVENOUS | Status: AC
  Filled 2020-12-10: qty 5

## 2020-12-10 MED ORDER — SODIUM CHLORIDE 0.9 % IV SOLN
Freq: Once | INTRAVENOUS | Status: AC
  Filled 2020-12-10: qty 250

## 2020-12-10 MED ORDER — SODIUM CHLORIDE 0.9 % IV SOLN
80.0000 mg/m2 | Freq: Once | INTRAVENOUS | Status: AC
  Administered 2020-12-10: 120 mg via INTRAVENOUS
  Filled 2020-12-10: qty 20

## 2020-12-13 ENCOUNTER — Ambulatory Visit: Payer: No Typology Code available for payment source | Admitting: Family

## 2020-12-14 ENCOUNTER — Other Ambulatory Visit: Payer: Self-pay

## 2020-12-14 ENCOUNTER — Ambulatory Visit
Admission: RE | Admit: 2020-12-14 | Discharge: 2020-12-14 | Disposition: A | Discharge status: Home / Self Care | Payer: No Typology Code available for payment source | Source: Ambulatory Visit | Attending: Oncology | Admitting: Oncology

## 2020-12-14 DIAGNOSIS — Z170 Estrogen receptor positive status [ER+]: Secondary | ICD-10-CM | POA: Insufficient documentation

## 2020-12-14 DIAGNOSIS — C50412 Malignant neoplasm of upper-outer quadrant of left female breast: Secondary | ICD-10-CM | POA: Insufficient documentation

## 2020-12-16 ENCOUNTER — Other Ambulatory Visit: Payer: Self-pay

## 2020-12-16 DIAGNOSIS — C50412 Malignant neoplasm of upper-outer quadrant of left female breast: Secondary | ICD-10-CM

## 2020-12-16 DIAGNOSIS — Z170 Estrogen receptor positive status [ER+]: Secondary | ICD-10-CM

## 2020-12-16 NOTE — Progress Notes (Signed)
Daily Session Note  Patient Details  Name: Tricia Berry MRN: [id number] Date of Birth: 08-Mar-1971 Referring Provider:    Encounter Date: 12/16/2020  Check In:  Session Check In - 12/16/20 1255      Check-In   Supervising physician immediately available to respond to emergencies See telemetry face sheet for immediately available ER MD    Location [facility]    Staff Present Nada Maclachlan, BA, ACSM CEP, Exercise Physiologist;Kara Eliezer Bottom, MS Exercise Physiologist    Virtual Visit No    Medication changes reported     Yes    Comments added inhaler    Fall or balance concerns reported    No    Tobacco Cessation No Change    Current number of cigarettes/nicotine per day     9    Warm-up and Cool-down Performed on first and last piece of equipment    Resistance Training Performed Yes    VAD Patient? No    PAD/SET Patient? No      Pain Assessment   Currently in Pain? No/denies    Multiple Pain Sites No              Social History   Tobacco Use  Smoking Status Current Every Day Smoker  Smokeless Tobacco Never Used    Goals Met:  Independence with exercise equipment Exercise tolerated well No report of cardiac concerns or symptoms Strength training completed today  Goals Unmet:  Not Applicable  Comments: Pt able to follow exercise prescription today without complaint.  Will continue to monitor for progression.    Dr. Emily Filbert is Medical Director for [place] and [facility].

## 2020-12-17 ENCOUNTER — Inpatient Hospital Stay: Payer: No Typology Code available for payment source

## 2020-12-17 ENCOUNTER — Encounter: Payer: Self-pay | Admitting: Oncology

## 2020-12-17 ENCOUNTER — Other Ambulatory Visit: Payer: Self-pay

## 2020-12-17 ENCOUNTER — Inpatient Hospital Stay (HOSPITAL_BASED_OUTPATIENT_CLINIC_OR_DEPARTMENT_OTHER): Payer: No Typology Code available for payment source | Admitting: Oncology

## 2020-12-17 VITALS — BP 117/79 | HR 78

## 2020-12-17 VITALS — BP 115/85 | HR 71 | Temp 96.5°F | Resp 20 | Wt 120.8 lb

## 2020-12-17 DIAGNOSIS — Z5111 Encounter for antineoplastic chemotherapy: Secondary | ICD-10-CM

## 2020-12-17 DIAGNOSIS — C50412 Malignant neoplasm of upper-outer quadrant of left female breast: Secondary | ICD-10-CM

## 2020-12-17 DIAGNOSIS — Z170 Estrogen receptor positive status [ER+]: Secondary | ICD-10-CM | POA: Diagnosis not present

## 2020-12-17 DIAGNOSIS — G893 Neoplasm related pain (acute) (chronic): Secondary | ICD-10-CM

## 2020-12-17 LAB — COMPREHENSIVE METABOLIC PANEL
ALT: 25 U/L (ref 0–44)
AST: 24 U/L (ref 15–41)
Albumin: 3.7 g/dL (ref 3.5–5.0)
Alkaline Phosphatase: 50 U/L (ref 38–126)
Anion gap: 12 (ref 5–15)
BUN: 13 mg/dL (ref 6–20)
CO2: 24 mmol/L (ref 22–32)
Calcium: 9.3 mg/dL (ref 8.9–10.3)
Chloride: 100 mmol/L (ref 98–111)
Creatinine, Ser: 0.76 mg/dL (ref 0.44–1.00)
GFR, Estimated: >60 mL/min (ref >60)
Glucose, Bld: 145 mg/dL — ABNORMAL HIGH (ref 70–99)
Potassium: 3.8 mmol/L (ref 3.5–5.1)
Sodium: 136 mmol/L (ref 135–145)
Total Bilirubin: 0.4 mg/dL (ref 0.3–1.2)
Total Protein: 6.5 g/dL (ref 6.5–8.1)

## 2020-12-17 LAB — CBC WITH DIFFERENTIAL/PLATELET
Abs Immature Granulocytes: 0.09 10*3/uL — ABNORMAL HIGH (ref 0.00–0.07)
Basophils Absolute: 0 10*3/uL (ref 0.0–0.1)
Basophils Relative: 0 %
Eosinophils Absolute: 0.1 10*3/uL (ref 0.0–0.5)
Eosinophils Relative: 1 %
HCT: 34.8 % — ABNORMAL LOW (ref 36.0–46.0)
Hemoglobin: 11.4 g/dL — ABNORMAL LOW (ref 12.0–15.0)
Immature Granulocytes: 1 %
Lymphocytes Relative: 20 %
Lymphs Abs: 1.6 10*3/uL (ref 0.7–4.0)
MCH: 33.5 pg (ref 26.0–34.0)
MCHC: 32.8 g/dL (ref 30.0–36.0)
MCV: 102.4 fL — ABNORMAL HIGH (ref 80.0–100.0)
Monocytes Absolute: 0.9 10*3/uL (ref 0.1–1.0)
Monocytes Relative: 11 %
Neutro Abs: 5.3 10*3/uL (ref 1.7–7.7)
Neutrophils Relative %: 67 %
Platelets: 225 10*3/uL (ref 150–400)
RBC: 3.4 MIL/uL — ABNORMAL LOW (ref 3.87–5.11)
RDW: 19.6 % — ABNORMAL HIGH (ref 11.5–15.5)
WBC: 7.9 10*3/uL (ref 4.0–10.5)
nRBC: 0 % (ref 0.0–0.2)

## 2020-12-17 MED ORDER — DIPHENHYDRAMINE HCL 50 MG/ML IJ SOLN
50.0000 mg | Freq: Once | INTRAMUSCULAR | Status: AC
  Administered 2020-12-17: 50 mg via INTRAVENOUS
  Filled 2020-12-17: qty 1

## 2020-12-17 MED ORDER — HEPARIN SOD (PORK) LOCK FLUSH 100 UNIT/ML IV SOLN
500.0000 [IU] | Freq: Once | INTRAVENOUS | Status: AC | PRN
  Administered 2020-12-17: 500 [IU]
  Filled 2020-12-17: qty 5

## 2020-12-17 MED ORDER — SODIUM CHLORIDE 0.9 % IV SOLN
Freq: Once | INTRAVENOUS | Status: AC
  Filled 2020-12-17: qty 250

## 2020-12-17 MED ORDER — SODIUM CHLORIDE 0.9% FLUSH
10.0000 mL | INTRAVENOUS | Status: DC | PRN
  Administered 2020-12-17: 10 mL
  Filled 2020-12-17: qty 10

## 2020-12-17 MED ORDER — SODIUM CHLORIDE 0.9 % IV SOLN
20.0000 mg | Freq: Once | INTRAVENOUS | Status: AC
  Administered 2020-12-17: 20 mg via INTRAVENOUS
  Filled 2020-12-17: qty 20

## 2020-12-17 MED ORDER — FAMOTIDINE IN NACL 20-0.9 MG/50ML-% IV SOLN
20.0000 mg | Freq: Once | INTRAVENOUS | Status: AC
  Administered 2020-12-17: 20 mg via INTRAVENOUS
  Filled 2020-12-17: qty 50

## 2020-12-17 MED ORDER — SODIUM CHLORIDE 0.9 % IV SOLN
80.0000 mg/m2 | Freq: Once | INTRAVENOUS | Status: AC
  Administered 2020-12-17: 120 mg via INTRAVENOUS
  Filled 2020-12-17: qty 20

## 2020-12-17 NOTE — Progress Notes (Signed)
Patient received prescribed treatment in clinic. IV Taxol. Tolerated well. Patient stable at discharge.

## 2020-12-17 NOTE — Progress Notes (Signed)
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 She then noticed that her lump is gradually getting larger with an area of ulceration on the skin and underwent a diagnostic bilateral mammogram on 09/08/2020 which showed hypoechoic interconnected masses in the left breast from 10:00 to 2 o'clock position spanning at least an area of 4.8 cm. Ultrasound also showed 5 abnormal lymph nodes in the axilla with cortical thickening. Both the mass and the lymph nodes were biopsied and was consistent with invasive mammary carcinoma grade 2. Tumor was ER +91 200%, PR +11 to 20% and HER-2 negative.Ki-67 15%.Patient will be meeting [name] from [facility] surgery to discuss surgical management. She is here for medical oncology recommendations. In terms of her general health patient is otherwise doing well and does not have significant comorbidities. She does endorse significant pain in the area of her left breast. Tylenol and Motrin has not been helping her with this pain.She lives with her husband and 2 children who have special needs. No prior history of abnormal breast mammograms or breast biopsies.  PET CT scan showed hypermetabolism in the area of the left breast but no evidence of  hypermetabolism in the left axillaOr evidence of distant metastatic disease.  Neoadjuvant dose dense ACT chemotherapy started on 10/08/2020. Interim ultrasound after 4 cycles of dose dense AC chemotherapy showed overall decrease in volume of the tumor.  Nodularity previously seen on ultrasound was also decreased in size.   Interval history-she is still fatigued but overall it is improving after stopping AC chemotherapy.  She is continuing to work with the exercise program as well.  Denies any significant tingling numbness in her hands and feet.  Denies any significant nausea or vomiting.  ECOG PS- 1 Pain scale- 2 Opioid associated constipation- no  Review of systems- Review of Systems  Constitutional: Positive for malaise/fatigue. Negative for chills, fever and weight loss.  HENT: Negative for congestion, ear discharge and nosebleeds.   Eyes: Negative for blurred vision.  Respiratory: Negative for cough, hemoptysis, sputum production, shortness of breath and wheezing.   Cardiovascular: Negative for chest pain, palpitations, orthopnea and claudication.  Gastrointestinal: Negative for abdominal pain, blood in stool, constipation, diarrhea, heartburn, melena, nausea and vomiting.  Genitourinary: Negative for dysuria, flank pain, frequency, hematuria and urgency.  Musculoskeletal: Negative for back pain, joint pain and myalgias.  Skin: Negative for rash.  Neurological: Negative for dizziness, tingling, focal weakness, seizures, weakness and headaches.  Endo/Heme/Allergies: Does not bruise/bleed easily.  Psychiatric/Behavioral: Negative for depression and suicidal ideas. The patient does not have insomnia.       Allergies  Allergen Reactions  . Morphine Other (See Comments)    migranes  . Sertraline Hcl     REACTION: Worsened symptoms of IBS  . Sulfamethoxazole Rash  . Sulfonamide Derivatives  Rash     Past Medical History:  Diagnosis Date  . Anxiety   . Breast cancer ([place])   .  Diverticulitis   . Family history of ovarian cancer   . GERD (gastroesophageal reflux disease)   . IBS (irritable bowel syndrome)   . Scoliosis      Past Surgical History:  Procedure Laterality Date  . ABDOMINAL HYSTERECTOMY     still has ovaries, no gyn cancer, hysterectomy due to endometriosis.   Marland Kitchen BREAST BIOPSY Left 09/15/2020   [place] bx of mass, path pending, Q marker  . BREAST BIOPSY Left 09/15/2020   [place] bx of LN, hydromarker, path pending  . IR IMAGING GUIDED PORT INSERTION  10/01/2020    Social History   Socioeconomic History  . Marital status: Divorced    Spouse name: Not on file  . Number of children: 2  . Years of education: Not on file  . Highest education level: Not on file  Occupational History  . Occupation: Optician, dispensing: [employer]  Tobacco Use  . Smoking status: Current Every Day Smoker  . Smokeless tobacco: Never Used  Vaping Use  . Vaping Use: Not on file  Substance and Sexual Activity  . Alcohol use: Not Currently  . Drug use: No  . Sexual activity: Not Currently    Birth control/protection: None  Other Topics Concern  . 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 She and her spouse are primary caregivers.    Social Determinants of Health   Financial Resource Strain: Not on file  Food Insecurity: Not on file  Transportation Needs: Not on file  Physical Activity: Not on file  Stress: Not on file  Social Connections: Not on file  Intimate Partner Violence: Not on file    Family History  Problem Relation Age of Onset  . Hypertension Mother   . Osteoarthritis Mother   . Diverticulitis Mother   . Heart failure Father   . Hypertension Father   . Gout Father   . Diverticulitis Brother   . Ovarian cancer Paternal Grandmother      Current Outpatient Medications:  .  acetaminophen (TYLENOL) 325 MG tablet, Take 650 mg by mouth every 6 (six) hours as  needed. (Patient not taking: Reported on 12/03/2020), Disp: , Rfl:  .  amoxicillin-clavulanate (AUGMENTIN) 875-125 MG tablet, Take 1 tablet by mouth 2 (two) times daily. (Patient not taking: Reported on 12/03/2020), Disp: 14 tablet, Rfl: 0 .  cyclobenzaprine (FLEXERIL) 10 MG tablet, Take 1 tablet (10 mg total) by mouth 3 (three) times daily as needed for muscle spasms. (Patient not taking: No sig reported), Disp: 30 tablet, Rfl: 0 .  dexamethasone (DECADRON) 4 MG tablet, Take 2 tablets (8 mg total) by mouth daily. Take daily for 3 days after chemo. Take with food., Disp: 30 tablet, Rfl: 1 .  DULoxetine (CYMBALTA) 30 MG capsule, Take one 30 mg tablet by mouth once a day for the first week. Then increase to two 30 mg tablets ( total 37m) by mouth once daily., Disp: 60 capsule, Rfl: 3 .  Lactobacillus (PROBIOTIC ACIDOPHILUS PO), Take 1 capsule by mouth daily., Disp: , Rfl:  .  lidocaine-prilocaine (EMLA) cream, Apply to affected area once, Disp: 30 g, Rfl: 3 .  LORazepam (ATIVAN) 0.5 MG tablet, Take 1 tablet (0.5 mg total) by mouth every 6 (six) hours as  needed (Nausea or vomiting)., Disp: 30 tablet, Rfl: 0 .  Multiple Vitamins-Minerals (MULTIVITAL PO), Take 1 Dose by mouth daily. (Patient not taking: Reported on 12/03/2020), Disp: , Rfl:  .  nicotine (NICODERM CQ - DOSED IN MG/24 HOURS) 21 mg/24hr patch, Place 1 patch (21 mg total) onto the skin daily., Disp: 28 patch, Rfl: 0 .  nicotine (NICODERM CQ - DOSED IN MG/24 HOURS) 21 mg/24hr patch, Place 1 patch (21 mg total) onto the skin daily., Disp: 28 patch, Rfl: 0 .  nicotine (NICOTROL) 10 MG inhaler, Inhale 1 Cartridge (1 continuous puffing total) into the lungs as needed for smoking cessation. (Patient not taking: No sig reported), Disp: 42 each, Rfl: 0 .  nicotine polacrilex (NICOTINE MINI) 4 MG lozenge, Take 1 lozenge (4 mg total) by mouth as needed for smoking cessation. (Patient not taking: No sig reported), Disp: 100 tablet, Rfl: 0 .  ondansetron  (ZOFRAN) 8 MG tablet, Take 1 tablet (8 mg total) by mouth 2 (two) times daily as needed. Start on the third day after chemotherapy., Disp: 30 tablet, Rfl: 1 .  oxyCODONE (OXY IR/ROXICODONE) 5 MG immediate release tablet, Take 1 tablet (5 mg total) by mouth every 6 (six) hours as needed for severe pain., Disp: 120 tablet, Rfl: 0 .  prochlorperazine (COMPAZINE) 10 MG tablet, Take 1 tablet (10 mg total) by mouth every 6 (six) hours as needed (Nausea or vomiting)., Disp: 30 tablet, Rfl: 1  Physical exam:  Vitals:   12/17/20 0855  BP: 115/85  Pulse: 71  Resp: 20  Temp: (!) 96.5 F (35.8 C)  TempSrc: Tympanic  SpO2: 100%  Weight: 120 lb 12.8 oz (54.8 kg)   Physical Exam Eyes:     Extraocular Movements: EOM normal.  Cardiovascular:     Heart sounds: Normal heart sounds.  Pulmonary:     Effort: Pulmonary effort is normal.  Skin:    General: Skin is warm and dry.  Neurological:     Mental Status: She is alert and oriented to person, place, and time.      CMP Latest Ref Rng & Units 12/10/2020  Glucose 70 - 99 mg/dL 142(H)  BUN 6 - 20 mg/dL 14  Creatinine 0.44 - 1.00 mg/dL 0.59  Sodium 135 - 145 mmol/L 136  Potassium 3.5 - 5.1 mmol/L 3.9  Chloride 98 - 111 mmol/L 103  CO2 22 - 32 mmol/L 22  Calcium 8.9 - 10.3 mg/dL 8.9  Total Protein 6.5 - 8.1 g/dL 6.5  Total Bilirubin 0.3 - 1.2 mg/dL 0.6  Alkaline Phos 38 - 126 U/L 55  AST 15 - 41 U/L 24  ALT 0 - 44 U/L 23   CBC Latest Ref Rng & Units 12/10/2020  WBC 4.0 - 10.5 K/uL 13.2(H)  Hemoglobin 12.0 - 15.0 g/dL 10.3(L)  Hematocrit 36.0 - 46.0 % 30.9(L)  Platelets 150 - 400 K/uL 230    No images are attached to the encounter.  US Breast Limited Uni Left Inc Axilla  Result Date: 12/14/2020 CLINICAL DATA:  50 year old female with diagnosis of multicentric left breast cancer initially identified in November of 2021. She is undergoing neoadjuvant chemotherapy, and presents for interval assessment of response to therapy. EXAM: ULTRASOUND  OF THE LEFT BREAST COMPARISON:  Previous exam(s). FINDINGS: On physical exam, there is firmness to the superior to upper-outer aspect of the left breast. There is a healing ulceration in the skin in the upper outer left breast adjacent to the nipple. Ultrasound targeted to the left breast  at 10 o'clock, 1 cm from the nipple demonstrates that the irregular hypoechoic mass measures 1.3 x 0.5 x 0.4 cm, previously measured as 1.9 x 0.5 x 1.9 cm on 09/08/2020. Ultrasound targeted to the left breast at 1-2 o'clock, 3 cm from the nipple demonstrates decrease in size and bulk of the mass. The current measurement is 3.3 x 0.7 x 2.5 cm, previously 3.2 x 1.0 x 1.8 cm. Though the measurements of the mass do not appear significantly different as the mass is measured in its largest diameter in the 3 planes, visually the volume of the mass certainly decreased in the upper-outer quadrant as compared to the pre biopsy images from 09/15/2020. Ultrasound of the left axilla demonstrates 7 small lymph nodes/masses. A biopsy marking clip can be clearly visualized in 1 of these lymph nodes, and this lymph node is now normal in size. While the other masses are small, they appear morphologically abnormal and have indistinct margins. IMPRESSION: 1. Overall, there is decreased volume of the tumor involving the upper inner and upper outer quadrants of the left breast. The nodularity previously seen on ultrasound has significantly reduced. 2. There are 7 small lymph nodes/masses in the left axilla which appear morphologically abnormal. This includes the previously biopsied metastatic lymph node. RECOMMENDATION: Continue treatment plan. I have discussed the findings and recommendations with the patient. If applicable, a reminder letter will be sent to the patient regarding the next appointment. BI-RADS CATEGORY  6: Known biopsy-proven malignancy. Electronically Signed   By: Ammie Ferrier M.D.   On: 12/14/2020 13:58     Assessment and  plan- Patient is a 50 y.o. female withinvasive mammary carcinoma of the left breast anatomical stage III Bc4N 1M0 ER/PR positive and HER-2 negative.   She is here for on treatment assessment prior to cycle 3 of neoadjuvant Taxol chemotherapy  Counts okay to proceed with cycle 3 of neoadjuvant Taxol chemotherapy today.  She will directly proceed for cycle 4 next week and I will see her back in 2 weeks for cycle 5  Discussed the results of interim ultrasound after 4 cycles of AC chemotherapy which showed overall reduction in the volume and nodularity of the breast mass seen on ultrasound.  Plan is to complete 12 weekly cycles of Taxol chemotherapy followed by at least left mastectomy.  Neoplasm related pain: Continue as needed oxycodone   Visit Diagnosis 1. Malignant neoplasm of upper-outer quadrant of left breast in female, estrogen receptor positive ([place])   2. Encounter for antineoplastic chemotherapy   3. Neoplasm related pain      Dr. Randa Evens, MD, MPH [facility] [phone] 12/17/2020 9:12 AM

## 2020-12-20 ENCOUNTER — Other Ambulatory Visit: Payer: Self-pay | Admitting: Oncology

## 2020-12-20 DIAGNOSIS — Z170 Estrogen receptor positive status [ER+]: Secondary | ICD-10-CM

## 2020-12-20 DIAGNOSIS — C50412 Malignant neoplasm of upper-outer quadrant of left female breast: Secondary | ICD-10-CM

## 2020-12-20 MED ORDER — LORAZEPAM 0.5 MG PO TABS: 0.5000 mg | ORAL_TABLET | Freq: Four times a day (QID) | ORAL | 0 refills | Status: DC | PRN

## 2020-12-21 ENCOUNTER — Ambulatory Visit (INDEPENDENT_AMBULATORY_CARE_PROVIDER_SITE_OTHER): Payer: No Typology Code available for payment source | Admitting: Plastic Surgery

## 2020-12-21 ENCOUNTER — Other Ambulatory Visit: Payer: Self-pay

## 2020-12-21 ENCOUNTER — Encounter: Payer: Self-pay | Admitting: Plastic Surgery

## 2020-12-21 VITALS — BP 118/77 | HR 102 | Ht 61.0 in | Wt 123.4 lb

## 2020-12-21 DIAGNOSIS — C50412 Malignant neoplasm of upper-outer quadrant of left female breast: Secondary | ICD-10-CM | POA: Diagnosis not present

## 2020-12-21 DIAGNOSIS — Z170 Estrogen receptor positive status [ER+]: Secondary | ICD-10-CM

## 2020-12-21 NOTE — Progress Notes (Addendum)
Patient ID: Tricia Berry, female    DOB: 1971/09/15, 50 y.o.   MRN: [id number]   Chief Complaint  Patient presents with  . Advice Only    The patient is a 50 yrs old wf here for a consultation for breast reconstruction.  She noticed a lump on her left breast about a year ago and had it looked into in November.  The mammogram was abnormal and she went on for an ultrasound and has since had a PET scan.  The pathology showed invasive mammary carcinoma grade 2.  The tumor is estrogen and progesterone positive and HER-2 negative with a Ki-67 of 15%.  She is seeing [name].  She is planning on a left mastectomy and is thinking about doing the right mastectomy as well.  She is married and has 2 kids.  She is currently on neoadjuvant chemotherapy which will be finished the end of April.  She is working on quitting smoking.  Radiation is most likely planned.  She does not have diabetes.  She has a history of low back pain and prescriptions for pain meds.  She has grade 2 ptosis bilaterally and the ulcer on the left breast is very close to the nipple areola.  The skin is healed at the moment.   Review of Systems  Constitutional: Positive for activity change. Negative for appetite change.  Eyes: Negative.   Respiratory: Negative.  Negative for chest tightness and shortness of breath.   Cardiovascular: Negative for leg swelling.  Gastrointestinal: Negative for abdominal distention and abdominal pain.  Endocrine: Negative.   Genitourinary: Negative.   Musculoskeletal: Negative.   Skin: Positive for color change and wound.  Neurological: Negative.   Psychiatric/Behavioral: Negative.     Past Medical History:  Diagnosis Date  . Anxiety   . Breast cancer ([place])   . Diverticulitis   . Family history of ovarian cancer   . GERD (gastroesophageal reflux disease)   . IBS (irritable bowel syndrome)   . Scoliosis     Past Surgical History:  Procedure Laterality Date  . ABDOMINAL  HYSTERECTOMY     still has ovaries, no gyn cancer, hysterectomy due to endometriosis.   Marland Kitchen BREAST BIOPSY Left 09/15/2020   [place] bx of mass, path pending, Q marker  . BREAST BIOPSY Left 09/15/2020   [place] bx of LN, hydromarker, path pending  . IR IMAGING GUIDED PORT INSERTION  10/01/2020      Current Outpatient Medications:  .  acetaminophen (TYLENOL) 325 MG tablet, Take 650 mg by mouth every 6 (six) hours as needed., Disp: , Rfl:  .  cyclobenzaprine (FLEXERIL) 10 MG tablet, Take 1 tablet (10 mg total) by mouth 3 (three) times daily as needed for muscle spasms., Disp: 30 tablet, Rfl: 0 .  dexamethasone (DECADRON) 4 MG tablet, Take 2 tablets (8 mg total) by mouth daily. Take daily for 3 days after chemo. Take with food., Disp: 30 tablet, Rfl: 1 .  DULoxetine (CYMBALTA) 30 MG capsule, Take one 30 mg tablet by mouth once a day for the first week. Then increase to two 30 mg tablets ( total 41m) by mouth once daily., Disp: 60 capsule, Rfl: 3 .  Lactobacillus (PROBIOTIC ACIDOPHILUS PO), Take 1 capsule by mouth daily., Disp: , Rfl:  .  lidocaine-prilocaine (EMLA) cream, Apply to affected area once, Disp: 30 g, Rfl: 3 .  LORazepam (ATIVAN) 0.5 MG tablet, Take 1 tablet (0.5 mg total) by mouth every 6 (six) hours as needed (  Nausea or vomiting)., Disp: 30 tablet, Rfl: 0 .  Multiple Vitamins-Minerals (MULTIVITAL PO), Take 1 Dose by mouth daily., Disp: , Rfl:  .  nicotine (NICODERM CQ - DOSED IN MG/24 HOURS) 21 mg/24hr patch, Place 1 patch (21 mg total) onto the skin daily., Disp: 28 patch, Rfl: 0 .  nicotine (NICODERM CQ - DOSED IN MG/24 HOURS) 21 mg/24hr patch, Place 1 patch (21 mg total) onto the skin daily., Disp: 28 patch, Rfl: 0 .  nicotine (NICOTROL) 10 MG inhaler, Inhale 1 Cartridge (1 continuous puffing total) into the lungs as needed for smoking cessation., Disp: 42 each, Rfl: 0 .  nicotine polacrilex (NICOTINE MINI) 4 MG lozenge, Take 1 lozenge (4 mg total) by mouth as needed for smoking cessation.,  Disp: 100 tablet, Rfl: 0 .  ondansetron (ZOFRAN) 8 MG tablet, Take 1 tablet (8 mg total) by mouth 2 (two) times daily as needed. Start on the third day after chemotherapy., Disp: 30 tablet, Rfl: 1 .  oxyCODONE (OXY IR/ROXICODONE) 5 MG immediate release tablet, Take 1 tablet (5 mg total) by mouth every 6 (six) hours as needed for severe pain., Disp: 120 tablet, Rfl: 0 .  prochlorperazine (COMPAZINE) 10 MG tablet, Take 1 tablet (10 mg total) by mouth every 6 (six) hours as needed (Nausea or vomiting)., Disp: 30 tablet, Rfl: 1   Objective:   Vitals:   12/21/20 0928  BP: 118/77  Pulse: (!) 102  SpO2: 97%    Physical Exam Vitals and nursing note reviewed.  Constitutional:      Appearance: Normal appearance.  HENT:     Head: Normocephalic and atraumatic.  Cardiovascular:     Rate and Rhythm: Normal rate.     Pulses: Normal pulses.  Pulmonary:     Effort: Pulmonary effort is normal. No respiratory distress.  Chest:    Abdominal:     General: Abdomen is flat. There is no distension.  Skin:    General: Skin is warm.     Capillary Refill: Capillary refill takes less than 2 seconds.     Coloration: Skin is not jaundiced.  Neurological:     General: No focal deficit present.     Mental Status: She is alert and oriented to person, place, and time.  Psychiatric:        Mood and Affect: Mood normal.        Behavior: Behavior normal.        Thought Content: Thought content normal.     Assessment & Plan:  Malignant neoplasm of upper-outer quadrant of left breast in female, estrogen receptor positive ([place])  We had a detailed conversation about the patient's options for breast reconstruction. Several reconstruction options were explained to the patient.  It is important to remember that breast reconstruction is an optional procedure. Reconstruction often requires several stages of surgery and this means more than one operation.  The surgeries are often done several months apart.  The  entire process from start to finish can take a year or more. The major goal of breast reconstruction is to look normal in clothing. There will always be scars and a difference noticeable without clothes.  This is true for asymmetries where both breasts will not be identical.  Surgery may be needed or desired to the non-cancerous breast in order to achieve better symmetry and satisfactory results.  Regardless of the reconstructive method, there is always risks and the possibility that the procedure will fail or have complications.  This could required additional surgeries.  We discussed the available methods of breast reconstruction and included:  1. Tissue expander with Acellular dermal matrix followed by implant based reconstruction. This can be done as one surgery or multiple surgeries.  2. Autologous reconstruction can include using a muscle or tissue from another area of the body for the reconstruction.  3. Combined procedures like the latissismus dorsi flaps that often uses the muscle with an expander or implant.  For each of the method discussed the risks, benefits, scars and recovery time were discussed in detail. Specific risks included bleeding, infection, hematoma, seroma, scarring, pain, wound healing complications, flap loss, fat necrosis, capsular contracture, need for implant removal, donor site complications, bulge, hernia, umbilical necrosis, need for urgent reoperation, and need for dressing changes.   After the options were discussed we focused on the patient's desires and the procedure that was best for her based on all the information.  A total of 45 minutes of face-to-face time was spent in this encounter, of which >60% was spent in counseling.    After lengthy discussion the patient is most likely going to opt for bilateral mastectomies.  She would like to try and do immediate reconstruction with expanders.  She will likely choose saline implants and nipple areolar tattooing.  We  will not try to salvage the nipple areolas.  The patient knows that if we cannot get the implants and prior to the radiation that that would likely lead to needing a latissimus muscle flap on the left side. I have spoken with [name] and confirmed all the above information is correct with him and the patient.  Pictures were obtained of the patient and placed in the chart with the patient's or guardian's permission.   [place], DO

## 2020-12-22 ENCOUNTER — Telehealth: Payer: Self-pay | Admitting: *Deleted

## 2020-12-22 NOTE — Telephone Encounter (Signed)
Faxed order to [facility] for Mastectomy supplies for the patient.  Confirmation received and copy scanned into the chart.//AB/[name]

## 2020-12-23 ENCOUNTER — Other Ambulatory Visit: Payer: Self-pay

## 2020-12-23 ENCOUNTER — Encounter: Payer: No Typology Code available for payment source | Attending: Family

## 2020-12-23 DIAGNOSIS — C50412 Malignant neoplasm of upper-outer quadrant of left female breast: Secondary | ICD-10-CM | POA: Insufficient documentation

## 2020-12-23 DIAGNOSIS — Z170 Estrogen receptor positive status [ER+]: Secondary | ICD-10-CM | POA: Insufficient documentation

## 2020-12-23 NOTE — Progress Notes (Signed)
Daily Session Note  Patient Details  Name: Tricia Berry MRN: [id number] Date of Birth: 1971/02/17 Referring Provider:    Encounter Date: 12/23/2020  Check In:  Session Check In - 12/23/20 1337      Check-In   Supervising physician immediately available to respond to emergencies See telemetry face sheet for immediately available ER MD    Location [facility]    Staff Present Coralie Keens, MS Exercise Physiologist;Amanda Oletta Darter, [place], ACSM CEP, Exercise Physiologist    Virtual Visit No    Medication changes reported     No    Fall or balance concerns reported    No    Tobacco Cessation No Change    Warm-up and Cool-down Performed on first and last piece of equipment    Resistance Training Performed Yes    VAD Patient? No    PAD/SET Patient? No              Social History   Tobacco Use  Smoking Status Current Every Day Smoker  Smokeless Tobacco Never Used    Goals Met:  Independence with exercise equipment Exercise tolerated well No report of cardiac concerns or symptoms Strength training completed today  Goals Unmet:  Not Applicable  Comments: Pt able to follow exercise prescription today without complaint.  Will continue to monitor for progression.   Dr. Emily Filbert is Medical Director for [place] [facility] and [facility].

## 2020-12-24 ENCOUNTER — Inpatient Hospital Stay: Payer: No Typology Code available for payment source

## 2020-12-24 ENCOUNTER — Inpatient Hospital Stay: Payer: No Typology Code available for payment source | Attending: Oncology

## 2020-12-24 VITALS — BP 120/80 | HR 98 | Temp 98.5°F | Resp 16

## 2020-12-24 DIAGNOSIS — C50412 Malignant neoplasm of upper-outer quadrant of left female breast: Secondary | ICD-10-CM | POA: Insufficient documentation

## 2020-12-24 DIAGNOSIS — Z170 Estrogen receptor positive status [ER+]: Secondary | ICD-10-CM

## 2020-12-24 DIAGNOSIS — Z79899 Other long term (current) drug therapy: Secondary | ICD-10-CM | POA: Diagnosis not present

## 2020-12-24 DIAGNOSIS — Z5111 Encounter for antineoplastic chemotherapy: Secondary | ICD-10-CM | POA: Insufficient documentation

## 2020-12-24 LAB — COMPREHENSIVE METABOLIC PANEL
ALT: 30 U/L (ref 0–44)
AST: 25 U/L (ref 15–41)
Albumin: 3.6 g/dL (ref 3.5–5.0)
Alkaline Phosphatase: 52 U/L (ref 38–126)
Anion gap: 10 (ref 5–15)
BUN: 15 mg/dL (ref 6–20)
CO2: 25 mmol/L (ref 22–32)
Calcium: 8.9 mg/dL (ref 8.9–10.3)
Chloride: 101 mmol/L (ref 98–111)
Creatinine, Ser: 0.59 mg/dL (ref 0.44–1.00)
GFR, Estimated: >60 mL/min (ref >60)
Glucose, Bld: 135 mg/dL — ABNORMAL HIGH (ref 70–99)
Potassium: 3.7 mmol/L (ref 3.5–5.1)
Sodium: 136 mmol/L (ref 135–145)
Total Bilirubin: 0.6 mg/dL (ref 0.3–1.2)
Total Protein: 6.7 g/dL (ref 6.5–8.1)

## 2020-12-24 LAB — CBC WITH DIFFERENTIAL/PLATELET
Abs Immature Granulocytes: 0.1 10*3/uL — ABNORMAL HIGH (ref 0.00–0.07)
Basophils Absolute: 0 10*3/uL (ref 0.0–0.1)
Basophils Relative: 0 %
Eosinophils Absolute: 0.2 10*3/uL (ref 0.0–0.5)
Eosinophils Relative: 1 %
HCT: 35.2 % — ABNORMAL LOW (ref 36.0–46.0)
Hemoglobin: 11.5 g/dL — ABNORMAL LOW (ref 12.0–15.0)
Immature Granulocytes: 1 %
Lymphocytes Relative: 6 %
Lymphs Abs: 0.8 10*3/uL (ref 0.7–4.0)
MCH: 33.8 pg (ref 26.0–34.0)
MCHC: 32.7 g/dL (ref 30.0–36.0)
MCV: 103.5 fL — ABNORMAL HIGH (ref 80.0–100.0)
Monocytes Absolute: 0.9 10*3/uL (ref 0.1–1.0)
Monocytes Relative: 7 %
Neutro Abs: 10.9 10*3/uL — ABNORMAL HIGH (ref 1.7–7.7)
Neutrophils Relative %: 85 %
Platelets: 226 10*3/uL (ref 150–400)
RBC: 3.4 MIL/uL — ABNORMAL LOW (ref 3.87–5.11)
RDW: 18.4 % — ABNORMAL HIGH (ref 11.5–15.5)
WBC: 12.8 10*3/uL — ABNORMAL HIGH (ref 4.0–10.5)
nRBC: 0 % (ref 0.0–0.2)

## 2020-12-24 MED ORDER — SODIUM CHLORIDE 0.9 % IV SOLN
80.0000 mg/m2 | Freq: Once | INTRAVENOUS | Status: AC
  Administered 2020-12-24: 120 mg via INTRAVENOUS
  Filled 2020-12-24: qty 20

## 2020-12-24 MED ORDER — HEPARIN SOD (PORK) LOCK FLUSH 100 UNIT/ML IV SOLN
500.0000 [IU] | Freq: Once | INTRAVENOUS | Status: AC | PRN
  Administered 2020-12-24: 500 [IU]
  Filled 2020-12-24: qty 5

## 2020-12-24 MED ORDER — FAMOTIDINE IN NACL 20-0.9 MG/50ML-% IV SOLN
20.0000 mg | Freq: Once | INTRAVENOUS | Status: AC
  Administered 2020-12-24: 20 mg via INTRAVENOUS
  Filled 2020-12-24: qty 50

## 2020-12-24 MED ORDER — SODIUM CHLORIDE 0.9 % IV SOLN
Freq: Once | INTRAVENOUS | Status: AC
  Filled 2020-12-24: qty 250

## 2020-12-24 MED ORDER — DIPHENHYDRAMINE HCL 50 MG/ML IJ SOLN
50.0000 mg | Freq: Once | INTRAMUSCULAR | Status: AC
  Administered 2020-12-24: 50 mg via INTRAVENOUS
  Filled 2020-12-24: qty 1

## 2020-12-24 MED ORDER — SODIUM CHLORIDE 0.9% FLUSH
10.0000 mL | INTRAVENOUS | Status: DC | PRN
  Administered 2020-12-24: 10 mL
  Filled 2020-12-24: qty 10

## 2020-12-24 MED ORDER — SODIUM CHLORIDE 0.9 % IV SOLN
20.0000 mg | Freq: Once | INTRAVENOUS | Status: AC
  Administered 2020-12-24: 20 mg via INTRAVENOUS
  Filled 2020-12-24: qty 20

## 2020-12-24 NOTE — Progress Notes (Signed)
Patient received prescribed treatment in clinic. Tolerated well. Patient stable at discharge. 

## 2020-12-27 ENCOUNTER — Telehealth: Payer: Self-pay

## 2020-12-27 ENCOUNTER — Inpatient Hospital Stay: Payer: No Typology Code available for payment source

## 2020-12-27 NOTE — Telephone Encounter (Signed)
Nutrition  Unable to reach patient at schedule nutrition phone follow-up visit.  Called home and mobile number.  Only able to leave voicemail on home number.    Malgorzata Albert B. Zenia Resides, [place], [place] Registered Dietitian [phone] (mobile)

## 2020-12-30 ENCOUNTER — Other Ambulatory Visit: Payer: Self-pay | Admitting: Nurse Practitioner

## 2020-12-30 ENCOUNTER — Encounter: Payer: Self-pay | Admitting: *Deleted

## 2020-12-30 NOTE — Progress Notes (Signed)
I spoke to the patient earlier this week.  She has financial concerns.  She has been working with Elease Etienne, [place] who is currently assisting with charitable funds for some of her bills.  She has 2 adult children with disabilities who live in the home.  We discussed looking in to the [employer] for assistance, a gift card, and possible drug assistance.  I spoke with Colletta [place] today with our drug assistance program and she will reach out to the patient today.  Gift card for [employer] left for the patient to pick up.  I have sent the patient a text message and voicemail to call me back.

## 2020-12-31 ENCOUNTER — Inpatient Hospital Stay: Payer: No Typology Code available for payment source

## 2020-12-31 ENCOUNTER — Encounter: Payer: Self-pay | Admitting: Oncology

## 2020-12-31 ENCOUNTER — Other Ambulatory Visit: Payer: Self-pay | Admitting: *Deleted

## 2020-12-31 ENCOUNTER — Inpatient Hospital Stay (HOSPITAL_BASED_OUTPATIENT_CLINIC_OR_DEPARTMENT_OTHER): Payer: No Typology Code available for payment source | Admitting: Oncology

## 2020-12-31 VITALS — HR 83

## 2020-12-31 VITALS — BP 119/89 | HR 101 | Temp 98.4°F | Resp 16 | Ht 61.0 in | Wt 124.8 lb

## 2020-12-31 DIAGNOSIS — G893 Neoplasm related pain (acute) (chronic): Secondary | ICD-10-CM

## 2020-12-31 DIAGNOSIS — Z5111 Encounter for antineoplastic chemotherapy: Secondary | ICD-10-CM

## 2020-12-31 DIAGNOSIS — Z170 Estrogen receptor positive status [ER+]: Secondary | ICD-10-CM | POA: Diagnosis not present

## 2020-12-31 DIAGNOSIS — C50412 Malignant neoplasm of upper-outer quadrant of left female breast: Secondary | ICD-10-CM

## 2020-12-31 LAB — CBC WITH DIFFERENTIAL/PLATELET
Abs Immature Granulocytes: 0.06 10*3/uL (ref 0.00–0.07)
Basophils Absolute: 0 10*3/uL (ref 0.0–0.1)
Basophils Relative: 0 %
Eosinophils Absolute: 0.2 10*3/uL (ref 0.0–0.5)
Eosinophils Relative: 2 %
HCT: 34.8 % — ABNORMAL LOW (ref 36.0–46.0)
Hemoglobin: 11.4 g/dL — ABNORMAL LOW (ref 12.0–15.0)
Immature Granulocytes: 1 %
Lymphocytes Relative: 17 %
Lymphs Abs: 1.3 10*3/uL (ref 0.7–4.0)
MCH: 33.8 pg (ref 26.0–34.0)
MCHC: 32.8 g/dL (ref 30.0–36.0)
MCV: 103.3 fL — ABNORMAL HIGH (ref 80.0–100.0)
Monocytes Absolute: 0.7 10*3/uL (ref 0.1–1.0)
Monocytes Relative: 10 %
Neutro Abs: 5.4 10*3/uL (ref 1.7–7.7)
Neutrophils Relative %: 70 %
Platelets: 206 10*3/uL (ref 150–400)
RBC: 3.37 MIL/uL — ABNORMAL LOW (ref 3.87–5.11)
RDW: 17.5 % — ABNORMAL HIGH (ref 11.5–15.5)
WBC: 7.7 10*3/uL (ref 4.0–10.5)
nRBC: 0 % (ref 0.0–0.2)

## 2020-12-31 LAB — COMPREHENSIVE METABOLIC PANEL
ALT: 39 U/L (ref 0–44)
AST: 31 U/L (ref 15–41)
Albumin: 3.5 g/dL (ref 3.5–5.0)
Alkaline Phosphatase: 55 U/L (ref 38–126)
Anion gap: 9 (ref 5–15)
BUN: 9 mg/dL (ref 6–20)
CO2: 24 mmol/L (ref 22–32)
Calcium: 8.9 mg/dL (ref 8.9–10.3)
Chloride: 103 mmol/L (ref 98–111)
Creatinine, Ser: 0.71 mg/dL (ref 0.44–1.00)
GFR, Estimated: >60 mL/min (ref >60)
Glucose, Bld: 131 mg/dL — ABNORMAL HIGH (ref 70–99)
Potassium: 3.7 mmol/L (ref 3.5–5.1)
Sodium: 136 mmol/L (ref 135–145)
Total Bilirubin: 0.5 mg/dL (ref 0.3–1.2)
Total Protein: 6.5 g/dL (ref 6.5–8.1)

## 2020-12-31 MED ORDER — SODIUM CHLORIDE 0.9 % IV SOLN
Freq: Once | INTRAVENOUS | Status: AC
  Filled 2020-12-31: qty 250

## 2020-12-31 MED ORDER — HEPARIN SOD (PORK) LOCK FLUSH 100 UNIT/ML IV SOLN
500.0000 [IU] | Freq: Once | INTRAVENOUS | Status: DC | PRN
  Filled 2020-12-31: qty 5

## 2020-12-31 MED ORDER — SODIUM CHLORIDE 0.9% FLUSH
10.0000 mL | Freq: Once | INTRAVENOUS | Status: AC
  Administered 2020-12-31: 10 mL via INTRAVENOUS
  Filled 2020-12-31: qty 10

## 2020-12-31 MED ORDER — OXYCODONE HCL 5 MG PO TABS: 5.0000 mg | ORAL_TABLET | Freq: Four times a day (QID) | ORAL | 0 refills | Status: DC | PRN

## 2020-12-31 MED ORDER — DIPHENHYDRAMINE HCL 50 MG/ML IJ SOLN
50.0000 mg | Freq: Once | INTRAMUSCULAR | Status: AC
  Administered 2020-12-31: 50 mg via INTRAVENOUS
  Filled 2020-12-31: qty 1

## 2020-12-31 MED ORDER — SODIUM CHLORIDE 0.9 % IV SOLN
80.0000 mg/m2 | Freq: Once | INTRAVENOUS | Status: AC
  Administered 2020-12-31: 120 mg via INTRAVENOUS
  Filled 2020-12-31: qty 20

## 2020-12-31 MED ORDER — SODIUM CHLORIDE 0.9 % IV SOLN
20.0000 mg | Freq: Once | INTRAVENOUS | Status: AC
  Administered 2020-12-31: 20 mg via INTRAVENOUS
  Filled 2020-12-31: qty 20

## 2020-12-31 MED ORDER — LIDOCAINE 5 % EX OINT
1.0000 | TOPICAL_OINTMENT | CUTANEOUS | 0 refills | Status: DC | PRN
Start: 2020-12-31 — End: 2021-01-10

## 2020-12-31 MED ORDER — FAMOTIDINE IN NACL 20-0.9 MG/50ML-% IV SOLN
20.0000 mg | Freq: Once | INTRAVENOUS | Status: AC
  Administered 2020-12-31: 20 mg via INTRAVENOUS
  Filled 2020-12-31: qty 50

## 2020-12-31 MED ORDER — HEPARIN SOD (PORK) LOCK FLUSH 100 UNIT/ML IV SOLN
500.0000 [IU] | Freq: Once | INTRAVENOUS | Status: AC
  Administered 2020-12-31: 500 [IU] via INTRAVENOUS
  Filled 2020-12-31: qty 5

## 2020-12-31 NOTE — Progress Notes (Signed)
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 She then noticed that her lump is gradually getting larger with an area of ulceration on the skin and underwent a diagnostic bilateral mammogram on 09/08/2020 which showed hypoechoic interconnected masses in the left breast from 10:00 to 2 o'clock position spanning at least an area of 4.8 cm. Ultrasound also showed 5 abnormal lymph nodes in the axilla with cortical thickening. Both the mass and the lymph nodes were biopsied and was consistent with invasive mammary carcinoma grade 2. Tumor was ER +91 200%, PR +11 to 20% and HER-2 negative.Ki-67 15%.Patient will be meeting [name] from [facility] to discuss surgical management. She is here for medical oncology recommendations. In terms of her general health patient is otherwise doing well and does not have significant comorbidities. She does endorse significant pain in the area of her left breast. Tylenol and Motrin has not been helping her with this pain.She lives with her husband and 2 children who have special needs. No prior history of abnormal breast mammograms or breast biopsies.  PET CT scan showed hypermetabolism in the area  of the left breast but no evidence of hypermetabolism in the left axillaOr evidence of distant metastatic disease.  Neoadjuvant dose dense ACT chemotherapy started on 10/08/2020. Interim ultrasound after 4 cycles of dose dense AC chemotherapy showed overall decrease in volume of the tumor.  Nodularity previously seen on ultrasound was also decreased in size.  Interval history-patient has baseline left breast pain for which he uses as needed oxycodone.  Also reports vulvar discomfort.  She has been using a nonestrogen-based vaginal lubricant for this.  Denies any urinary symptoms.  Denies any tingling numbness in her extremities.  ECOG PS- 1 Pain scale- 4 Opioid associated constipation- no  Review of systems- Review of Systems  Constitutional: Positive for malaise/fatigue. Negative for chills, fever and weight loss.  HENT: Negative for congestion, ear discharge and nosebleeds.   Eyes: Negative for blurred vision.  Respiratory: Negative for cough, hemoptysis, sputum production, shortness of breath and wheezing.   Cardiovascular: Negative for chest pain, palpitations, orthopnea and claudication.  Gastrointestinal: Negative for abdominal pain, blood in stool, constipation, diarrhea, heartburn, melena, nausea and vomiting.  Genitourinary: Negative for dysuria, flank pain, frequency, hematuria and urgency.       Vulvar pain  Musculoskeletal: Negative for back pain, joint pain and myalgias.  Skin: Negative for rash.  Neurological: Negative for dizziness, tingling, focal weakness, seizures, weakness and headaches.  Endo/Heme/Allergies: Does not bruise/bleed easily.  Psychiatric/Behavioral: Negative for depression and suicidal ideas. The patient does not have insomnia.   Left breast pain   Allergies  Allergen Reactions  . Morphine Other (See Comments)    migranes  . Sertraline Hcl  REACTION: Worsened symptoms of IBS  . Sulfamethoxazole Rash  . Sulfonamide Derivatives Rash     Past  Medical History:  Diagnosis Date  . Anxiety   . Breast cancer ([place])   . Diverticulitis   . Family history of ovarian cancer   . GERD (gastroesophageal reflux disease)   . IBS (irritable bowel syndrome)   . Scoliosis      Past Surgical History:  Procedure Laterality Date  . ABDOMINAL HYSTERECTOMY     still has ovaries, no gyn cancer, hysterectomy due to endometriosis.   Marland Kitchen BREAST BIOPSY Left 09/15/2020   [place] bx of mass, path pending, Q marker  . BREAST BIOPSY Left 09/15/2020   [place] bx of LN, hydromarker, path pending  . IR IMAGING GUIDED PORT INSERTION  10/01/2020    Social History   Socioeconomic History  . Marital status: Divorced    Spouse name: Not on file  . Number of children: 2  . Years of education: Not on file  . Highest education level: Not on file  Occupational History  . Occupation: Optician, dispensing: [employer]  Tobacco Use  . Smoking status: Current Every Day Smoker  . Smokeless tobacco: Never Used  Vaping Use  . Vaping Use: Not on file  Substance and Sexual Activity  . Alcohol use: Not Currently  . Drug use: No  . Sexual activity: Not Currently    Birth control/protection: None  Other Topics Concern  . 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 She and her spouse are primary caregivers.    Social Determinants of Health   Financial Resource Strain: Not on file  Food Insecurity: Not on file  Transportation Needs: Not on file  Physical Activity: Not on file  Stress: Not on file  Social Connections: Not on file  Intimate Partner Violence: Not on file    Family History  Problem Relation Age of Onset  . Hypertension Mother   . Osteoarthritis Mother   . Diverticulitis Mother   . Heart failure Father   . Hypertension Father   . Gout Father   . Diverticulitis Brother   . Ovarian cancer Paternal Grandmother      Current Outpatient Medications:  .   acetaminophen (TYLENOL) 325 MG tablet, Take 650 mg by mouth every 6 (six) hours as needed., Disp: , Rfl:  .  cyclobenzaprine (FLEXERIL) 10 MG tablet, Take 1 tablet (10 mg total) by mouth 3 (three) times daily as needed for muscle spasms., Disp: 30 tablet, Rfl: 0 .  dexamethasone (DECADRON) 4 MG tablet, Take 2 tablets (8 mg total) by mouth daily. Take daily for 3 days after chemo. Take with food., Disp: 30 tablet, Rfl: 1 .  DULoxetine (CYMBALTA) 30 MG capsule, Take one 30 mg tablet by mouth once a day for the first week. Then increase to two 30 mg tablets ( total 74m) by mouth once daily., Disp: 60 capsule, Rfl: 3 .  Lactobacillus (PROBIOTIC ACIDOPHILUS PO), Take 1 capsule by mouth daily., Disp: , Rfl:  .  lidocaine-prilocaine (EMLA) cream, Apply to affected area once, Disp: 30 g, Rfl: 3 .  LORazepam (ATIVAN) 0.5 MG tablet, Take 1 tablet (0.5 mg total) by mouth every 6 (six) hours as needed (Nausea or vomiting)., Disp: 30 tablet, Rfl: 0 .  Multiple Vitamins-Minerals (MULTIVITAL PO), Take 1 Dose by mouth daily., Disp: , Rfl:  .  nicotine (NICODERM CQ - DOSED IN MG/24 HOURS) 21 mg/24hr patch, Place 1 patch (21 mg total) onto the skin daily., Disp: 28 patch, Rfl: 0 .  nicotine (NICODERM CQ - DOSED IN MG/24 HOURS) 21 mg/24hr patch, Place 1 patch (21 mg total) onto the skin daily., Disp: 28 patch, Rfl: 0 .  nicotine (NICOTROL) 10 MG inhaler, Inhale 1 Cartridge (1 continuous puffing total) into the lungs as needed for smoking cessation., Disp: 42 each, Rfl: 0 .  nicotine polacrilex (NICOTINE MINI) 4 MG lozenge, Take 1 lozenge (4 mg total) by mouth as needed for smoking cessation., Disp: 100 tablet, Rfl: 0 .  ondansetron (ZOFRAN) 8 MG tablet, Take 1 tablet (8 mg total) by mouth 2 (two) times daily as needed. Start on the third day after chemotherapy., Disp: 30 tablet, Rfl: 1 .  oxyCODONE (OXY IR/ROXICODONE) 5 MG immediate release tablet, Take 1 tablet (5 mg total) by mouth every 6 (six) hours as needed for  severe pain., Disp: 120 tablet, Rfl: 0 .  prochlorperazine (COMPAZINE) 10 MG tablet, Take 1 tablet (10 mg total) by mouth every 6 (six) hours as needed (Nausea or vomiting)., Disp: 30 tablet, Rfl: 1 No current facility-administered medications for this visit.  Facility-Administered Medications Ordered in Other Visits:  .  heparin lock flush 100 unit/mL, 500 Units, Intravenous, Once, Randa Evens C, MD .  sodium chloride flush (NS) 0.9 % injection 10 mL, 10 mL, Intravenous, Once, Sindy Guadeloupe, MD  Physical exam:  Vitals:   12/31/20 0853  BP: 119/89  Pulse: (!) 101  Resp: 16  Temp: 98.4 F (36.9 C)  TempSrc: Oral  Weight: 124 lb 12.8 oz (56.6 kg)  Height: '5\' 1"'  (1.549 m)   Physical Exam Cardiovascular:     Rate and Rhythm: Regular rhythm. Tachycardia present.  Pulmonary:     Effort: Pulmonary effort is normal.  Abdominal:     General: Bowel sounds are normal.     Palpations: Abdomen is soft.  Skin:    General: Skin is warm and dry.  Neurological:     Mental Status: She is alert and oriented to person, place, and time.      CMP Latest Ref Rng & Units 12/24/2020  Glucose 70 - 99 mg/dL 135(H)  BUN 6 - 20 mg/dL 15  Creatinine 0.44 - 1.00 mg/dL 0.59  Sodium 135 - 145 mmol/L 136  Potassium 3.5 - 5.1 mmol/L 3.7  Chloride 98 - 111 mmol/L 101  CO2 22 - 32 mmol/L 25  Calcium 8.9 - 10.3 mg/dL 8.9  Total Protein 6.5 - 8.1 g/dL 6.7  Total Bilirubin 0.3 - 1.2 mg/dL 0.6  Alkaline Phos 38 - 126 U/L 52  AST 15 - 41 U/L 25  ALT 0 - 44 U/L 30   CBC Latest Ref Rng & Units 12/24/2020  WBC 4.0 - 10.5 K/uL 12.8(H)  Hemoglobin 12.0 - 15.0 g/dL 11.5(L)  Hematocrit 36.0 - 46.0 % 35.2(L)  Platelets 150 - 400 K/uL 226    No images are attached to the encounter.  US Breast Limited Uni Left Inc Axilla  Result Date: 12/14/2020 CLINICAL DATA:  50 year old female with diagnosis of multicentric left breast cancer initially identified in November of 2021. She is undergoing neoadjuvant  chemotherapy, and presents for interval assessment of response to therapy. EXAM: ULTRASOUND OF THE LEFT BREAST COMPARISON:  Previous exam(s). FINDINGS: On physical exam, there is firmness to the superior to upper-outer aspect of the left breast. There is a healing ulceration in  the skin in the upper outer left breast adjacent to the nipple. Ultrasound targeted to the left breast at 10 o'clock, 1 cm from the nipple demonstrates that the irregular hypoechoic mass measures 1.3 x 0.5 x 0.4 cm, previously measured as 1.9 x 0.5 x 1.9 cm on 09/08/2020. Ultrasound targeted to the left breast at 1-2 o'clock, 3 cm from the nipple demonstrates decrease in size and bulk of the mass. The current measurement is 3.3 x 0.7 x 2.5 cm, previously 3.2 x 1.0 x 1.8 cm. Though the measurements of the mass do not appear significantly different as the mass is measured in its largest diameter in the 3 planes, visually the volume of the mass certainly decreased in the upper-outer quadrant as compared to the pre biopsy images from 09/15/2020. Ultrasound of the left axilla demonstrates 7 small lymph nodes/masses. A biopsy marking clip can be clearly visualized in 1 of these lymph nodes, and this lymph node is now normal in size. While the other masses are small, they appear morphologically abnormal and have indistinct margins. IMPRESSION: 1. Overall, there is decreased volume of the tumor involving the upper inner and upper outer quadrants of the left breast. The nodularity previously seen on ultrasound has significantly reduced. 2. There are 7 small lymph nodes/masses in the left axilla which appear morphologically abnormal. This includes the previously biopsied metastatic lymph node. RECOMMENDATION: Continue treatment plan. I have discussed the findings and recommendations with the patient. If applicable, a reminder letter will be sent to the patient regarding the next appointment. BI-RADS CATEGORY  6: Known biopsy-proven malignancy.  Electronically Signed   By: Ammie Ferrier M.D.   On: 12/14/2020 13:58     Assessment and plan- Patient is a 50 y.o. female withinvasive mammary carcinoma of the left breast anatomical stage III Bc4N 1M0 ER/PR positive and HER-2 negative. She is here for on treatment assessment prior to cycle 5 of neoadjuvant Taxol chemotherapy  Plans okay to proceed with cycle 5 of neoadjuvant Taxol chemotherapy today.  She will directly proceed for cycle 6 next week and I will see her back in 2 weeks for cycle 7.  Patient has met with Dr. Marla Roe and will tentatively finish her chemotherapy in about 6 weeks.  She will at least require a unilateral mastectomy but is leaning towards a bilateral mastectomy with reconstruction  Vulvar pain: On external pelvic exam there is no evidence of erythema, abnormal vaginal discharge or evidence of infection or inflammation.  I have recommended that she should try over-the-counter lidocaine jelly to see if it would help her with the discomfort.  If discomfort does not boldly after 1 week we will consider another pelvic exam and vaginal swab.  Hold off on any antibiotics at this time.  Neoplasm related pain: Continue as needed oxycodone   Visit Diagnosis 1. Encounter for antineoplastic chemotherapy   2. Malignant neoplasm of upper-outer quadrant of left breast in female, estrogen receptor positive ([place])   3. Neoplasm related pain      Dr. Randa Evens, MD, MPH [facility] [phone] 12/31/2020 8:37 AM

## 2020-12-31 NOTE — Progress Notes (Signed)
Pt has been having vaginal pain, she does not feel like it is UTI- she has had them in the past and it does not feel that way. She states that labia and wahat parts she can see is red and irritated, swollen. She using replens and it just not going away.  She is drinking 3 bottles of water a day, sweet tea, coffee in am and ensure, eating good. Has few white spots in her mouth but she can still eat. She has a cough all the time. She has production but it is clear or sounds bad but no production at times- she is still smoking and on several things nicotine to try to stop smoking. She states she sleep good as long as she uses ativan.

## 2021-01-07 ENCOUNTER — Inpatient Hospital Stay: Payer: No Typology Code available for payment source

## 2021-01-07 VITALS — BP 130/82 | HR 93 | Temp 96.7°F | Resp 20 | Wt 125.0 lb

## 2021-01-07 DIAGNOSIS — Z5111 Encounter for antineoplastic chemotherapy: Secondary | ICD-10-CM | POA: Diagnosis not present

## 2021-01-07 DIAGNOSIS — Z170 Estrogen receptor positive status [ER+]: Secondary | ICD-10-CM

## 2021-01-07 DIAGNOSIS — C50412 Malignant neoplasm of upper-outer quadrant of left female breast: Secondary | ICD-10-CM

## 2021-01-07 LAB — COMPREHENSIVE METABOLIC PANEL
ALT: 33 U/L (ref 0–44)
AST: 22 U/L (ref 15–41)
Albumin: 3.6 g/dL (ref 3.5–5.0)
Alkaline Phosphatase: 52 U/L (ref 38–126)
Anion gap: 8 (ref 5–15)
BUN: 11 mg/dL (ref 6–20)
CO2: 26 mmol/L (ref 22–32)
Calcium: 8.7 mg/dL — ABNORMAL LOW (ref 8.9–10.3)
Chloride: 99 mmol/L (ref 98–111)
Creatinine, Ser: 0.69 mg/dL (ref 0.44–1.00)
GFR, Estimated: >60 mL/min (ref >60)
Glucose, Bld: 104 mg/dL — ABNORMAL HIGH (ref 70–99)
Potassium: 3.6 mmol/L (ref 3.5–5.1)
Sodium: 133 mmol/L — ABNORMAL LOW (ref 135–145)
Total Bilirubin: 0.6 mg/dL (ref 0.3–1.2)
Total Protein: 6.7 g/dL (ref 6.5–8.1)

## 2021-01-07 LAB — CBC WITH DIFFERENTIAL/PLATELET
Abs Immature Granulocytes: 0.08 10*3/uL — ABNORMAL HIGH (ref 0.00–0.07)
Basophils Absolute: 0 10*3/uL (ref 0.0–0.1)
Basophils Relative: 0 %
Eosinophils Absolute: 0.1 10*3/uL (ref 0.0–0.5)
Eosinophils Relative: 1 %
HCT: 36.6 % (ref 36.0–46.0)
Hemoglobin: 12.1 g/dL (ref 12.0–15.0)
Immature Granulocytes: 1 %
Lymphocytes Relative: 20 %
Lymphs Abs: 1.6 10*3/uL (ref 0.7–4.0)
MCH: 34 pg (ref 26.0–34.0)
MCHC: 33.1 g/dL (ref 30.0–36.0)
MCV: 102.8 fL — ABNORMAL HIGH (ref 80.0–100.0)
Monocytes Absolute: 0.8 10*3/uL (ref 0.1–1.0)
Monocytes Relative: 10 %
Neutro Abs: 5.3 10*3/uL (ref 1.7–7.7)
Neutrophils Relative %: 68 %
Platelets: 219 10*3/uL (ref 150–400)
RBC: 3.56 MIL/uL — ABNORMAL LOW (ref 3.87–5.11)
RDW: 16.7 % — ABNORMAL HIGH (ref 11.5–15.5)
WBC: 7.9 10*3/uL (ref 4.0–10.5)
nRBC: 0 % (ref 0.0–0.2)

## 2021-01-07 MED ORDER — SODIUM CHLORIDE 0.9 % IV SOLN
80.0000 mg/m2 | Freq: Once | INTRAVENOUS | Status: AC
  Administered 2021-01-07: 120 mg via INTRAVENOUS
  Filled 2021-01-07: qty 20

## 2021-01-07 MED ORDER — FAMOTIDINE 20 MG IN NS 100 ML IVPB
20.0000 mg | Freq: Once | INTRAVENOUS | Status: AC
  Administered 2021-01-07: 20 mg via INTRAVENOUS
  Filled 2021-01-07: qty 2

## 2021-01-07 MED ORDER — SODIUM CHLORIDE 0.9 % IV SOLN
20.0000 mg | Freq: Once | INTRAVENOUS | Status: AC
  Administered 2021-01-07: 20 mg via INTRAVENOUS
  Filled 2021-01-07: qty 20

## 2021-01-07 MED ORDER — SODIUM CHLORIDE 0.9% FLUSH
10.0000 mL | INTRAVENOUS | Status: DC | PRN
  Administered 2021-01-07: 10 mL via INTRAVENOUS
  Filled 2021-01-07: qty 10

## 2021-01-07 MED ORDER — FAMOTIDINE IN NACL 20-0.9 MG/50ML-% IV SOLN: 20.0000 mg | Freq: Once | INTRAVENOUS | Status: DC

## 2021-01-07 MED ORDER — HEPARIN SOD (PORK) LOCK FLUSH 100 UNIT/ML IV SOLN
500.0000 [IU] | Freq: Once | INTRAVENOUS | Status: AC
  Administered 2021-01-07: 500 [IU] via INTRAVENOUS
  Filled 2021-01-07: qty 5

## 2021-01-07 MED ORDER — SODIUM CHLORIDE 0.9 % IV SOLN
Freq: Once | INTRAVENOUS | Status: AC
Start: 2021-01-07 — End: 2021-01-07
  Filled 2021-01-07: qty 250

## 2021-01-07 MED ORDER — HEPARIN SOD (PORK) LOCK FLUSH 100 UNIT/ML IV SOLN
INTRAVENOUS | Status: AC
  Filled 2021-01-07: qty 5

## 2021-01-07 MED ORDER — DIPHENHYDRAMINE HCL 50 MG/ML IJ SOLN
50.0000 mg | Freq: Once | INTRAMUSCULAR | Status: AC
  Administered 2021-01-07: 50 mg via INTRAVENOUS
  Filled 2021-01-07: qty 1

## 2021-01-10 ENCOUNTER — Encounter: Payer: Self-pay | Admitting: Family

## 2021-01-10 ENCOUNTER — Ambulatory Visit (INDEPENDENT_AMBULATORY_CARE_PROVIDER_SITE_OTHER): Payer: No Typology Code available for payment source | Admitting: Family

## 2021-01-10 ENCOUNTER — Other Ambulatory Visit (HOSPITAL_COMMUNITY)
Admission: RE | Admit: 2021-01-10 | Discharge: 2021-01-10 | Disposition: A | Discharge status: Home / Self Care | Payer: No Typology Code available for payment source | Source: Ambulatory Visit | Attending: Family | Admitting: Family

## 2021-01-10 ENCOUNTER — Other Ambulatory Visit: Payer: Self-pay

## 2021-01-10 VITALS — BP 120/80 | HR 106 | Temp 97.7°F | Ht 61.0 in | Wt 125.4 lb

## 2021-01-10 DIAGNOSIS — Z124 Encounter for screening for malignant neoplasm of cervix: Secondary | ICD-10-CM | POA: Diagnosis not present

## 2021-01-10 DIAGNOSIS — F411 Generalized anxiety disorder: Secondary | ICD-10-CM

## 2021-01-10 DIAGNOSIS — R300 Dysuria: Secondary | ICD-10-CM | POA: Diagnosis present

## 2021-01-10 DIAGNOSIS — R232 Flushing: Secondary | ICD-10-CM

## 2021-01-10 DIAGNOSIS — M545 Low back pain, unspecified: Secondary | ICD-10-CM | POA: Diagnosis not present

## 2021-01-10 LAB — URINALYSIS, ROUTINE W REFLEX MICROSCOPIC
Bilirubin Urine: NEGATIVE
Ketones, ur: NEGATIVE
Nitrite: NEGATIVE
Specific Gravity, Urine: 1.01 (ref 1.000–1.030)
Total Protein, Urine: 30 — AB
Urine Glucose: NEGATIVE
Urobilinogen, UA: 0.2 (ref 0.0–1.0)
pH: 6 (ref 5.0–8.0)

## 2021-01-10 LAB — POCT URINALYSIS DIPSTICK
Bilirubin, UA: NEGATIVE
Glucose, UA: NEGATIVE
Ketones, UA: NEGATIVE
Nitrite, UA: NEGATIVE
Protein, UA: POSITIVE — AB
Spec Grav, UA: 1.015 (ref 1.010–1.025)
Urobilinogen, UA: 0.2 E.U./dL
pH, UA: 5.5 (ref 5.0–8.0)

## 2021-01-10 LAB — TSH: TSH: 9.02 u[IU]/mL — ABNORMAL HIGH (ref 0.35–4.50)

## 2021-01-10 MED ORDER — LIDOCAINE 5 % EX OINT: 1.0000 "application " | TOPICAL_OINTMENT | CUTANEOUS | 0 refills | Status: DC | PRN

## 2021-01-10 MED ORDER — FLUCONAZOLE 150 MG PO TABS: 150.0000 mg | ORAL_TABLET | Freq: Once | ORAL | 2 refills | Status: DC

## 2021-01-10 MED ORDER — DULOXETINE HCL 30 MG PO CPEP: 60.0000 mg | ORAL_CAPSULE | Freq: Every day | ORAL | 3 refills | Status: DC

## 2021-01-10 NOTE — Patient Instructions (Signed)
Appearance of yeast infection Will call with urine results as well  Start diflucan and likely will need two doses  Let me know how you are doing

## 2021-01-10 NOTE — Progress Notes (Signed)
Subjective:    Patient ID: Tricia Berry, female    DOB: Apr 21, 1971, 50 y.o.   MRN: [id number]  CC: Tricia Berry is a 50 y.o. female who presents today for follow up.   HPI: Complains today of vaginal pain x 3 weeks Prior to intense pain, she has felt vaginal dryness for the past 2 months, and she has been using replens for vaginal without resolve.   No vaginal discharge, vaginal bleeding. Describes exterior of vagina as burning, itching, red and swollen. Pain with urination.    No numbness ,  urinary frequency , hematuria.    Dr Janese Banks recommended OTC lidocaine jelly 11 days ago.   First chemotherapy Dec 17th. Currently undergoing weekly chemotherapy , last infusion 3 days ago, for left breast cancer without metastasis. She is following with dr Janese Banks. She prescribes oxycodone for left breast pain.  Following with Dr Marla Roe for discussion of unilateral or bilateral mastectomy.  Anxiety- increased cymbalta 60mg  with some relief. Dr Janese Banks is prescribing ativan 0.5mg  qhs  to help with anxiety, sleep. Tried Zoloft in the past which didn't help.She is not working currently. Significant other supportive.  No depression.        HISTORY:  Past Medical History:  Diagnosis Date  . Anxiety   . Breast cancer ([place])   . Diverticulitis   . Family history of ovarian cancer   . GERD (gastroesophageal reflux disease)   . IBS (irritable bowel syndrome)   . Scoliosis    Past Surgical History:  Procedure Laterality Date  . ABDOMINAL HYSTERECTOMY     still has ovaries, no gyn cancer, hysterectomy due to endometriosis. NO cervix on exam 01/10/21  . BREAST BIOPSY Left 09/15/2020   [place] bx of mass, path pending, Q marker  . BREAST BIOPSY Left 09/15/2020   [place] bx of LN, hydromarker, path pending  . IR IMAGING GUIDED PORT INSERTION  10/01/2020   Family History  Problem Relation Age of Onset  . Hypertension Mother   . Osteoarthritis Mother   . Diverticulitis Mother   . Heart failure  Father   . Hypertension Father   . Gout Father   . Diverticulitis Brother   . Ovarian cancer Paternal Grandmother     Allergies: Morphine, Sertraline hcl, Sulfamethoxazole, and Sulfonamide derivatives Current Outpatient Medications on File Prior to Visit  Medication Sig Dispense Refill  . acetaminophen (TYLENOL) 325 MG tablet Take 650 mg by mouth every 6 (six) hours as needed.    . cyclobenzaprine (FLEXERIL) 10 MG tablet Take 1 tablet (10 mg total) by mouth 3 (three) times daily as needed for muscle spasms. 30 tablet 0  . dexamethasone (DECADRON) 4 MG tablet Take 2 tablets (8 mg total) by mouth daily. Take daily for 3 days after chemo. Take with food. 30 tablet 1  . Lactobacillus (PROBIOTIC ACIDOPHILUS PO) Take 1 capsule by mouth daily.    Marland Kitchen lidocaine-prilocaine (EMLA) cream Apply to affected area once 30 g 3  . LORazepam (ATIVAN) 0.5 MG tablet Take 1 tablet (0.5 mg total) by mouth every 6 (six) hours as needed (Nausea or vomiting). 30 tablet 0  . Multiple Vitamins-Minerals (MULTIVITAL PO) Take 1 Dose by mouth daily.    . nicotine (NICODERM CQ - DOSED IN MG/24 HOURS) 21 mg/24hr patch Place 1 patch (21 mg total) onto the skin daily. 28 patch 0  . nicotine (NICODERM CQ - DOSED IN MG/24 HOURS) 21 mg/24hr patch Place 1 patch (21 mg total) onto the skin daily.  28 patch 0  . nicotine (NICOTROL) 10 MG inhaler Inhale 1 Cartridge (1 continuous puffing total) into the lungs as needed for smoking cessation. 42 each 0  . nicotine polacrilex (NICOTINE MINI) 4 MG lozenge Take 1 lozenge (4 mg total) by mouth as needed for smoking cessation. 100 tablet 0  . ondansetron (ZOFRAN) 8 MG tablet Take 1 tablet (8 mg total) by mouth 2 (two) times daily as needed. Start on the third day after chemotherapy. 30 tablet 1  . oxyCODONE (OXY IR/ROXICODONE) 5 MG immediate release tablet Take 1 tablet (5 mg total) by mouth every 6 (six) hours as needed for severe pain (pt can take 2 tablet at night only,  in addition for her  every 6 hours PRN). 150 tablet 0  . prochlorperazine (COMPAZINE) 10 MG tablet Take 1 tablet (10 mg total) by mouth every 6 (six) hours as needed (Nausea or vomiting). 30 tablet 1   No current facility-administered medications on file prior to visit.    Social History   Tobacco Use  . Smoking status: Current Every Day Smoker  . Smokeless tobacco: Never Used  Vaping Use  . Vaping Use: Never used  Substance Use Topics  . Alcohol use: Not Currently  . Drug use: No    Review of Systems  Constitutional: Negative for chills and fever.  Respiratory: Negative for cough.   Cardiovascular: Negative for chest pain and palpitations.  Gastrointestinal: Negative for nausea and vomiting.  Genitourinary: Positive for dysuria and vaginal pain. Negative for frequency, hematuria, pelvic pain, vaginal bleeding and vaginal discharge.  Psychiatric/Behavioral: Positive for sleep disturbance. The patient is nervous/anxious.       Objective:    BP 120/80   Pulse (!) 106   Temp 97.7 F (36.5 C)   Ht 5\' 1"  (1.549 m)   Wt 125 lb 6.4 oz (56.9 kg)   SpO2 99%   BMI 23.69 kg/m  BP Readings from Last 3 Encounters:  01/10/21 120/80  01/07/21 130/82  12/31/20 119/89   Wt Readings from Last 3 Encounters:  01/10/21 125 lb 6.4 oz (56.9 kg)  01/07/21 125 lb (56.7 kg)  12/31/20 124 lb 12.8 oz (56.6 kg)    Physical Exam Vitals reviewed.  Constitutional:      Appearance: She is well-developed.  Eyes:     Conjunctiva/sclera: Conjunctivae normal.  Cardiovascular:     Rate and Rhythm: Normal rate and regular rhythm.     Pulses: Normal pulses.     Heart sounds: Normal heart sounds.  Pulmonary:     Effort: Pulmonary effort is normal.     Breath sounds: Normal breath sounds. No wheezing, rhonchi or rales.  Genitourinary:    Labia:        Right: Rash and tenderness present. No lesion.        Left: Rash and tenderness present. No lesion.      Vagina: No foreign body. Vaginal discharge present. No  bleeding.     Comments: Inspection of labia, hyper erythematous. No rash, lesions. Skin intact. No bleeding.  Internal exam with abudance of thick white clumpy discharge. NO cervix on exam. Pap collected of vaginal wall.  Skin:    General: Skin is warm and dry.  Neurological:     Mental Status: She is alert.  Psychiatric:        Speech: Speech normal.        Behavior: Behavior normal.        Thought Content: Thought content normal.  Assessment & Plan:   Problem List Items Addressed This Visit      Genitourinary   Candidal vulvovaginitis - Primary    Exam consistent with candida. Start diflucan and likely will need second dose in 72 hours. Pending urine studies. Pap done of vaginal canal as she appeared overdue for pap; however h/o hysterectomy and confirmed no cervix today. She no longer requires cervical cancer screening.      Relevant Medications   lidocaine (XYLOCAINE) 5 % ointment     Other   Anxiety state    Improved overall. Patient declines medication changes today and we will continue to discuss at follow up. Continue cymbalta 60mg , ativan 0.5mg  qhs.       Relevant Medications   DULoxetine (CYMBALTA) 30 MG capsule   Low back pain   Relevant Medications   DULoxetine (CYMBALTA) 30 MG capsule    Other Visit Diagnoses    Hot flashes       Relevant Orders   TSH (Completed)   Screening for cervical cancer       Relevant Orders   Cytology - PAP       I have changed Tricia Berry "[name]"'s DULoxetine. I am also having her start on fluconazole. Additionally, I am having her maintain her cyclobenzaprine, Multiple Vitamins-Minerals (MULTIVITAL PO), Lactobacillus (PROBIOTIC ACIDOPHILUS PO), lidocaine-prilocaine, ondansetron, prochlorperazine, acetaminophen, nicotine, nicotine polacrilex, nicotine, dexamethasone, nicotine, LORazepam, oxyCODONE, and lidocaine.   Meds ordered this encounter  Medications  . lidocaine (XYLOCAINE) 5 % ointment    Sig: Apply  1 application topically as needed.    Dispense:  35.44 g    Refill:  0    Order Specific Question:   Supervising Provider    Answer:   Deborra Medina L [[id number]]  . DULoxetine (CYMBALTA) 30 MG capsule    Sig: Take 2 capsules (60 mg total) by mouth daily.    Dispense:  120 capsule    Refill:  3    Order Specific Question:   Supervising Provider    Answer:   Deborra Medina L [[id number]]  . fluconazole (DIFLUCAN) 150 MG tablet    Sig: Take 1 tablet (150 mg total) by mouth once for 1 dose. Take one tablet PO once. If sxs persist, may take one tablet PO 3 days later.    Dispense:  2 tablet    Refill:  2    Order Specific Question:   Supervising Provider    Answer:   Crecencio Mc [[id number]]    Return precautions given.   Risks, benefits, and alternatives of the medications and treatment plan prescribed today were discussed, and patient expressed understanding.   Education regarding symptom management and diagnosis given to patient on AVS.  Continue to follow with Burnard Hawthorne, FNP for routine health maintenance.   Alden Benjamin Santistevan and I agreed with plan.   Mable Paris, FNP

## 2021-01-11 DIAGNOSIS — C50412 Malignant neoplasm of upper-outer quadrant of left female breast: Secondary | ICD-10-CM

## 2021-01-11 LAB — CERVICOVAGINAL ANCILLARY ONLY
Bacterial Vaginitis (gardnerella): NEGATIVE
Candida Glabrata: NEGATIVE
Candida Vaginitis: POSITIVE — AB
Comment: NEGATIVE
Comment: NEGATIVE
Comment: NEGATIVE

## 2021-01-11 NOTE — Progress Notes (Signed)
Daily Session Note  Patient Details  Name: Tricia Berry MRN: [id number] Date of Birth: April 03, 1971 Referring Provider:    Encounter Date: 01/11/2021  Check In:  Session Check In - 01/11/21 1235      Check-In   Supervising physician immediately available to respond to emergencies See telemetry face sheet for immediately available ER MD    Location [facility]    Staff Present Birdie Sons, MPA, RN;Jessica Luan Pulling, MA, RCEP, CCRP, Marylynn Pearson, MS Exercise Physiologist    Virtual Visit No    Medication changes reported     No    Fall or balance concerns reported    No    Tobacco Cessation No Change    Warm-up and Cool-down Performed on first and last piece of equipment    Resistance Training Performed Yes    VAD Patient? No    PAD/SET Patient? No      Pain Assessment   Currently in Pain? No/denies              Social History   Tobacco Use  Smoking Status Current Every Day Smoker  Smokeless Tobacco Never Used    Goals Met:  Independence with exercise equipment Exercise tolerated well No report of cardiac concerns or symptoms Strength training completed today  Goals Unmet:  Not Applicable  Comments: Pt able to follow exercise prescription today without complaint.  Will continue to monitor for progression.    Dr. Emily Filbert is Medical Director for [facility] and [facility].

## 2021-01-12 ENCOUNTER — Other Ambulatory Visit: Payer: Self-pay | Admitting: Oncology

## 2021-01-12 ENCOUNTER — Other Ambulatory Visit: Payer: Self-pay | Admitting: Family

## 2021-01-12 ENCOUNTER — Encounter: Payer: Self-pay | Admitting: Family

## 2021-01-12 ENCOUNTER — Telehealth: Payer: Self-pay | Admitting: Family

## 2021-01-12 DIAGNOSIS — N3001 Acute cystitis with hematuria: Secondary | ICD-10-CM

## 2021-01-12 DIAGNOSIS — R7989 Other specified abnormal findings of blood chemistry: Secondary | ICD-10-CM

## 2021-01-12 DIAGNOSIS — C50412 Malignant neoplasm of upper-outer quadrant of left female breast: Secondary | ICD-10-CM

## 2021-01-12 LAB — URINE CULTURE
MICRO NUMBER:: 11670940
SPECIMEN QUALITY:: ADEQUATE

## 2021-01-12 MED ORDER — AMOXICILLIN-POT CLAVULANATE 875-125 MG PO TABS
1.0000 | ORAL_TABLET | Freq: Two times a day (BID) | ORAL | 0 refills | Status: AC
Start: 2021-01-12 — End: 2021-01-19

## 2021-01-12 NOTE — Assessment & Plan Note (Addendum)
Exam consistent with candida. Start diflucan and likely will need second dose in 72 hours. Pending urine studies. Pap done of vaginal canal as she appeared overdue for pap; however h/o hysterectomy and confirmed no cervix today. She no longer requires cervical cancer screening.

## 2021-01-12 NOTE — Assessment & Plan Note (Signed)
Improved overall. Patient declines medication changes today and we will continue to discuss at follow up. Continue cymbalta 60mg , ativan 0.5mg  qhs.

## 2021-01-13 ENCOUNTER — Other Ambulatory Visit: Payer: Self-pay

## 2021-01-13 ENCOUNTER — Other Ambulatory Visit: Payer: Self-pay | Admitting: Oncology

## 2021-01-13 DIAGNOSIS — C50412 Malignant neoplasm of upper-outer quadrant of left female breast: Secondary | ICD-10-CM

## 2021-01-13 DIAGNOSIS — Z170 Estrogen receptor positive status [ER+]: Secondary | ICD-10-CM

## 2021-01-13 LAB — CYTOLOGY - PAP
Comment: NEGATIVE
Diagnosis: NEGATIVE
High risk HPV: NEGATIVE

## 2021-01-13 MED ORDER — NICOTINE POLACRILEX 4 MG MT LOZG: 4.0000 mg | LOZENGE | OROMUCOSAL | 0 refills | Status: DC | PRN

## 2021-01-13 MED ORDER — LORAZEPAM 0.5 MG PO TABS: 0.5000 mg | ORAL_TABLET | Freq: Four times a day (QID) | ORAL | 0 refills | Status: DC | PRN

## 2021-01-13 MED ORDER — NICOTINE 21 MG/24HR TD PT24
21.0000 mg | MEDICATED_PATCH | Freq: Every day | TRANSDERMAL | 0 refills | Status: DC
Start: 2021-01-13 — End: 2021-01-13

## 2021-01-13 NOTE — Progress Notes (Signed)
Daily Session Note  Patient Details  Name: Tricia Berry MRN: [id number] Date of Birth: [birth year] Referring Provider:    Encounter Date: 01/13/2021  Check In:  Session Check In - 01/13/21 1237      Check-In   Supervising physician immediately available to respond to emergencies See telemetry face sheet for immediately available ER MD    Location [facility]    Staff Present Birdie Sons, MPA, Elveria Rising, BA, ACSM CEP, Exercise Physiologist;Kara Eliezer Bottom, MS Exercise Physiologist    Virtual Visit No    Medication changes reported     Yes    Comments added flucanozole and started augmentin    Fall or balance concerns reported    No    Tobacco Cessation Use Decreased    Current number of cigarettes/nicotine per day     5    Warm-up and Cool-down Performed on first and last piece of equipment    Resistance Training Performed Yes    VAD Patient? No    PAD/SET Patient? No      Pain Assessment   Currently in Pain? No/denies              Social History   Tobacco Use  Smoking Status Current Every Day Smoker  Smokeless Tobacco Never Used    Goals Met:  Yoga completed Independence with exercise equipment Exercise tolerated well No report of cardiac concerns or symptoms Strength training completed today  Goals Unmet:  Not Applicable  Comments: Pt able to follow exercise prescription today without complaint.  Will continue to monitor for progression.    Dr. Emily Filbert is Medical Director for [facility] and [facility].

## 2021-01-14 ENCOUNTER — Encounter: Payer: Self-pay | Admitting: Oncology

## 2021-01-14 ENCOUNTER — Inpatient Hospital Stay (HOSPITAL_BASED_OUTPATIENT_CLINIC_OR_DEPARTMENT_OTHER): Payer: No Typology Code available for payment source | Admitting: Oncology

## 2021-01-14 ENCOUNTER — Inpatient Hospital Stay: Payer: No Typology Code available for payment source

## 2021-01-14 ENCOUNTER — Other Ambulatory Visit: Payer: Self-pay | Admitting: *Deleted

## 2021-01-14 VITALS — BP 130/81 | HR 91 | Resp 16

## 2021-01-14 VITALS — BP 130/84 | HR 100 | Temp 97.3°F | Resp 16 | Ht 61.0 in | Wt 127.5 lb

## 2021-01-14 DIAGNOSIS — C50412 Malignant neoplasm of upper-outer quadrant of left female breast: Secondary | ICD-10-CM | POA: Diagnosis not present

## 2021-01-14 DIAGNOSIS — Z5111 Encounter for antineoplastic chemotherapy: Secondary | ICD-10-CM | POA: Diagnosis not present

## 2021-01-14 DIAGNOSIS — L270 Generalized skin eruption due to drugs and medicaments taken internally: Secondary | ICD-10-CM

## 2021-01-14 DIAGNOSIS — Z170 Estrogen receptor positive status [ER+]: Secondary | ICD-10-CM

## 2021-01-14 DIAGNOSIS — R300 Dysuria: Secondary | ICD-10-CM

## 2021-01-14 DIAGNOSIS — R12 Heartburn: Secondary | ICD-10-CM

## 2021-01-14 DIAGNOSIS — G893 Neoplasm related pain (acute) (chronic): Secondary | ICD-10-CM

## 2021-01-14 LAB — CBC WITH DIFFERENTIAL/PLATELET
Abs Immature Granulocytes: 0.15 10*3/uL — ABNORMAL HIGH (ref 0.00–0.07)
Basophils Absolute: 0 10*3/uL (ref 0.0–0.1)
Basophils Relative: 1 %
Eosinophils Absolute: 0.1 10*3/uL (ref 0.0–0.5)
Eosinophils Relative: 1 %
HCT: 35.4 % — ABNORMAL LOW (ref 36.0–46.0)
Hemoglobin: 11.7 g/dL — ABNORMAL LOW (ref 12.0–15.0)
Immature Granulocytes: 3 %
Lymphocytes Relative: 22 %
Lymphs Abs: 1.3 10*3/uL (ref 0.7–4.0)
MCH: 33.9 pg (ref 26.0–34.0)
MCHC: 33.1 g/dL (ref 30.0–36.0)
MCV: 102.6 fL — ABNORMAL HIGH (ref 80.0–100.0)
Monocytes Absolute: 0.7 10*3/uL (ref 0.1–1.0)
Monocytes Relative: 12 %
Neutro Abs: 3.7 10*3/uL (ref 1.7–7.7)
Neutrophils Relative %: 61 %
Platelets: 244 10*3/uL (ref 150–400)
RBC: 3.45 MIL/uL — ABNORMAL LOW (ref 3.87–5.11)
RDW: 16 % — ABNORMAL HIGH (ref 11.5–15.5)
WBC: 6 10*3/uL (ref 4.0–10.5)
nRBC: 0 % (ref 0.0–0.2)

## 2021-01-14 LAB — COMPREHENSIVE METABOLIC PANEL
ALT: 39 U/L (ref 0–44)
AST: 31 U/L (ref 15–41)
Albumin: 3.5 g/dL (ref 3.5–5.0)
Alkaline Phosphatase: 56 U/L (ref 38–126)
Anion gap: 9 (ref 5–15)
BUN: 14 mg/dL (ref 6–20)
CO2: 24 mmol/L (ref 22–32)
Calcium: 8.9 mg/dL (ref 8.9–10.3)
Chloride: 102 mmol/L (ref 98–111)
Creatinine, Ser: 0.76 mg/dL (ref 0.44–1.00)
GFR, Estimated: >60 mL/min (ref >60)
Glucose, Bld: 150 mg/dL — ABNORMAL HIGH (ref 70–99)
Potassium: 3.8 mmol/L (ref 3.5–5.1)
Sodium: 135 mmol/L (ref 135–145)
Total Bilirubin: 0.4 mg/dL (ref 0.3–1.2)
Total Protein: 6.4 g/dL — ABNORMAL LOW (ref 6.5–8.1)

## 2021-01-14 MED ORDER — HEPARIN SOD (PORK) LOCK FLUSH 100 UNIT/ML IV SOLN
500.0000 [IU] | Freq: Once | INTRAVENOUS | Status: AC | PRN
  Administered 2021-01-14: 500 [IU]
  Filled 2021-01-14: qty 5

## 2021-01-14 MED ORDER — HEPARIN SOD (PORK) LOCK FLUSH 100 UNIT/ML IV SOLN
INTRAVENOUS | Status: AC
  Filled 2021-01-14: qty 5

## 2021-01-14 MED ORDER — FAMOTIDINE 20 MG IN NS 100 ML IVPB
20.0000 mg | Freq: Once | INTRAVENOUS | Status: AC
  Administered 2021-01-14: 20 mg via INTRAVENOUS
  Filled 2021-01-14: qty 20

## 2021-01-14 MED ORDER — DIPHENHYDRAMINE HCL 50 MG/ML IJ SOLN
50.0000 mg | Freq: Once | INTRAMUSCULAR | Status: AC
  Administered 2021-01-14: 50 mg via INTRAVENOUS
  Filled 2021-01-14: qty 1

## 2021-01-14 MED ORDER — SODIUM CHLORIDE 0.9 % IV SOLN
Freq: Once | INTRAVENOUS | Status: AC
  Filled 2021-01-14: qty 250

## 2021-01-14 MED ORDER — SODIUM CHLORIDE 0.9 % IV SOLN
80.0000 mg/m2 | Freq: Once | INTRAVENOUS | Status: AC
  Administered 2021-01-14: 120 mg via INTRAVENOUS
  Filled 2021-01-14: qty 20

## 2021-01-14 MED ORDER — UREA 10 % EX CREA: TOPICAL_CREAM | CUTANEOUS | 0 refills | Status: DC | PRN

## 2021-01-14 MED ORDER — LIDOCAINE 5 % EX OINT: 1.0000 "application " | TOPICAL_OINTMENT | CUTANEOUS | 1 refills | Status: DC | PRN

## 2021-01-14 MED ORDER — SODIUM CHLORIDE 0.9 % IV SOLN
20.0000 mg | Freq: Once | INTRAVENOUS | Status: AC
  Administered 2021-01-14: 20 mg via INTRAVENOUS
  Filled 2021-01-14: qty 20

## 2021-01-14 MED ORDER — PANTOPRAZOLE SODIUM 20 MG PO TBEC: 20.0000 mg | DELAYED_RELEASE_TABLET | Freq: Every day | ORAL | 2 refills | Status: DC

## 2021-01-14 NOTE — Progress Notes (Signed)
Pt was told she has UTI and was given augmentin and she took diflucan, she is using lidocaine on privates. She feels achy in her breasts and axilla, she has sinus issues and started on claritin and flonase daily, she has red spots and rough skin on both hands and left arm that bothers her. Still having night sweats. Feels that she has dryness of throat and hoarseness- she takes halls lozenge and moisturizing rinse.

## 2021-01-14 NOTE — Progress Notes (Signed)
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 She then noticed that her lump is gradually getting larger with an area of ulceration on the skin and underwent a diagnostic bilateral mammogram on 09/08/2020 which showed hypoechoic interconnected masses in the left breast from 10:00 to 2 o'clock position spanning at least an area of 4.8 cm. Ultrasound also showed 5 abnormal lymph nodes in the axilla with cortical thickening. Both the mass and the lymph nodes were biopsied and was consistent with invasive mammary carcinoma grade 2. Tumor was ER +91- 100%, PR +11 to 20% and HER-2 negative.Ki-67 15%.Patient will be meeting [name] from [facility] surgery to discuss surgical management. She is here for medical oncology recommendations. In terms of her general health patient is otherwise doing well and does not have significant comorbidities. She does endorse significant pain in the area of her left breast. Tylenol and Motrin has not been helping her with this pain.She lives with her husband and 2 children who have special needs. No prior history of abnormal breast mammograms or breast biopsies.  PET CT scan showed hypermetabolism in the area  of the left breast but no evidence of hypermetabolism in the left axillaOr evidence of distant metastatic disease.  Neoadjuvant dose dense ACT chemotherapy started on 10/08/2020.Interim ultrasound after 4 cycles of dose dense AC chemotherapy showed overall decrease in volume of the tumor. Nodularity previously seen on ultrasound was also decreased in size.   Interval history-patient reports that she was diagnosed with her episode of UTI and received Augmentin and Diflucan.  She reports ongoing fatigue as well as pain in her bilateral chest wall as well as dull throbbing pain in her left breast.  Reports rash involving her bilateral hands.  Right big toenail is sensitive to touch.  Denies any persistent tingling numbness in her extremities.  ECOG PS- 1 Pain scale- 3 Opioid associated constipation- no  Review of systems- Review of Systems  Constitutional: Positive for malaise/fatigue. Negative for chills, fever and weight loss.  HENT: Negative for congestion, ear discharge and nosebleeds.   Eyes: Negative for blurred vision.  Respiratory: Negative for cough, hemoptysis, sputum production, shortness of breath and wheezing.   Cardiovascular: Negative for chest pain, palpitations, orthopnea and claudication.  Gastrointestinal: Negative for abdominal pain, blood in stool, constipation, diarrhea, heartburn, melena, nausea and vomiting.  Genitourinary: Negative for dysuria, flank pain, frequency, hematuria and urgency.  Musculoskeletal: Negative for back pain, joint pain and myalgias.       Bilateral chest wall pain and dull throbbing pain in the left breast  Skin: Negative for rash.  Neurological: Negative for dizziness, tingling, focal weakness, seizures, weakness and headaches.  Endo/Heme/Allergies: Does not bruise/bleed easily.  Psychiatric/Behavioral: Negative for depression and suicidal ideas. The patient does not  have insomnia.       Allergies  Allergen Reactions  . Morphine Other  (See Comments)    migranes  . Sertraline Hcl     REACTION: Worsened symptoms of IBS  . Sulfamethoxazole Rash  . Sulfonamide Derivatives Rash     Past Medical History:  Diagnosis Date  . Anxiety   . Breast cancer ([place])   . Diverticulitis   . Family history of ovarian cancer   . GERD (gastroesophageal reflux disease)   . IBS (irritable bowel syndrome)   . Scoliosis      Past Surgical History:  Procedure Laterality Date  . ABDOMINAL HYSTERECTOMY     still has ovaries, no gyn cancer, hysterectomy due to endometriosis. NO cervix on exam 01/10/21  . BREAST BIOPSY Left 09/15/2020   [place] bx of mass, path pending, Q marker  . BREAST BIOPSY Left 09/15/2020   [place] bx of LN, hydromarker, path pending  . IR IMAGING GUIDED PORT INSERTION  10/01/2020    Social History   Socioeconomic History  . Marital status: Divorced    Spouse name: Not on file  . Number of children: 2  . Years of education: Not on file  . Highest education level: Not on file  Occupational History  . Occupation: Optician, dispensing: [employer]  Tobacco Use  . Smoking status: Current Every Day Smoker    Packs/day: 0.50  . Smokeless tobacco: Never Used  Vaping Use  . Vaping Use: Never used  Substance and Sexual Activity  . Alcohol use: Not Currently  . Drug use: No  . Sexual activity: Not Currently    Birth control/protection: None  Other Topics Concern  . 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 She and her spouse are primary caregivers.    Social Determinants of Health   Financial Resource Strain: Not on file  Food Insecurity: Not on file  Transportation Needs: Not on file  Physical Activity: Not on file  Stress: Not on file  Social Connections: Not on file  Intimate Partner Violence: Not on file    Family History  Problem Relation Age of Onset  . Hypertension Mother   . Osteoarthritis Mother   .  Diverticulitis Mother   . Heart failure Father   . Hypertension Father   . Gout Father   . Diverticulitis Brother   . Ovarian cancer Paternal Grandmother      Current Outpatient Medications:  .  acetaminophen (TYLENOL) 325 MG tablet, Take 650 mg by mouth every 6 (six) hours as needed., Disp: , Rfl:  .  amoxicillin-clavulanate (AUGMENTIN) 875-125 MG tablet, Take 1 tablet by mouth 2 (two) times daily for 7 days., Disp: 14 tablet, Rfl: 0 .  dexamethasone (DECADRON) 4 MG tablet, Take 2 tablets (8 mg total) by mouth daily. Take daily for 3 days after chemo. Take with food., Disp: 30 tablet, Rfl: 1 .  DULoxetine (CYMBALTA) 30 MG capsule, Take 2 capsules (60 mg total) by mouth daily., Disp: 120 capsule, Rfl: 3 .  fluconazole (DIFLUCAN) 100 MG tablet, Take 100 mg by mouth daily., Disp: , Rfl:  .  fluticasone (FLONASE) 50 MCG/ACT nasal spray, Place 1 spray into both nostrils daily., Disp: , Rfl:  .  Lactobacillus (PROBIOTIC ACIDOPHILUS PO), Take 1 capsule by mouth daily., Disp: , Rfl:  .  lidocaine (XYLOCAINE) 5 % ointment, Apply 1 application topically  as needed., Disp: 35.44 g, Rfl: 0 .  lidocaine-prilocaine (EMLA) cream, Apply to affected area once, Disp: 30 g, Rfl: 3 .  loratadine (CLARITIN) 10 MG tablet, Take 10 mg by mouth daily., Disp: , Rfl:  .  LORazepam (ATIVAN) 0.5 MG tablet, Take 1 tablet (0.5 mg total) by mouth every 6 (six) hours as needed (Nausea or vomiting)., Disp: 30 tablet, Rfl: 0 .  Multiple Vitamins-Minerals (MULTIVITAL PO), Take 1 Dose by mouth daily., Disp: , Rfl:  .  nicotine (NICODERM CQ - DOSED IN MG/24 HOURS) 21 mg/24hr patch, Place 1 patch (21 mg total) onto the skin daily., Disp: 28 patch, Rfl: 0 .  nicotine (NICOTROL) 10 MG inhaler, Inhale 1 Cartridge (1 continuous puffing total) into the lungs as needed for smoking cessation., Disp: 42 each, Rfl: 0 .  nicotine polacrilex (NICOTINE MINI) 4 MG lozenge, Take 1 lozenge (4 mg total) by mouth as needed for smoking cessation.,  Disp: 100 tablet, Rfl: 0 .  ondansetron (ZOFRAN) 8 MG tablet, Take 1 tablet (8 mg total) by mouth 2 (two) times daily as needed. Start on the third day after chemotherapy., Disp: 30 tablet, Rfl: 1 .  oxyCODONE (OXY IR/ROXICODONE) 5 MG immediate release tablet, Take 1 tablet (5 mg total) by mouth every 6 (six) hours as needed for severe pain (pt can take 2 tablet at night only,  in addition for her every 6 hours PRN)., Disp: 150 tablet, Rfl: 0 .  cyclobenzaprine (FLEXERIL) 10 MG tablet, Take 1 tablet (10 mg total) by mouth 3 (three) times daily as needed for muscle spasms. (Patient not taking: Reported on 01/14/2021), Disp: 30 tablet, Rfl: 0 .  prochlorperazine (COMPAZINE) 10 MG tablet, Take 1 tablet (10 mg total) by mouth every 6 (six) hours as needed (Nausea or vomiting). (Patient not taking: Reported on 01/14/2021), Disp: 30 tablet, Rfl: 1  Physical exam:  Vitals:   01/14/21 0854  BP: 130/84  Pulse: 100  Resp: 16  Temp: (!) 97.3 F (36.3 C)  TempSrc: Tympanic  Weight: 127 lb 8 oz (57.8 kg)  Height: 5' 1" (1.549 m)   Physical Exam Constitutional:      General: She is not in acute distress. Cardiovascular:     Rate and Rhythm: Normal rate and regular rhythm.     Heart sounds: Normal heart sounds.  Pulmonary:     Effort: Pulmonary effort is normal.     Breath sounds: Normal breath sounds.  Skin:    Comments: Bilateral hands appear erythematous with circular maculopapular lesions extending into the forearm  Neurological:     Mental Status: She is alert and oriented to person, place, and time.   Breast exam: Left breast mass is palpably smaller.  Continued skin thickening and healed ulceration at the 2 o'clock position of the left breast.  CMP Latest Ref Rng & Units 01/14/2021  Glucose 70 - 99 mg/dL 150(H)  BUN 6 - 20 mg/dL 14  Creatinine 0.44 - 1.00 mg/dL 0.76  Sodium 135 - 145 mmol/L 135  Potassium 3.5 - 5.1 mmol/L 3.8  Chloride 98 - 111 mmol/L 102  CO2 22 - 32 mmol/L 24   Calcium 8.9 - 10.3 mg/dL 8.9  Total Protein 6.5 - 8.1 g/dL 6.4(L)  Total Bilirubin 0.3 - 1.2 mg/dL 0.4  Alkaline Phos 38 - 126 U/L 56  AST 15 - 41 U/L 31  ALT 0 - 44 U/L 39   CBC Latest Ref Rng & Units 01/14/2021  WBC 4.0 - 10.5 K/uL  6.0  Hemoglobin 12.0 - 15.0 g/dL 11.7(L)  Hematocrit 36.0 - 46.0 % 35.4(L)  Platelets 150 - 400 K/uL 244     Assessment and plan- Patient is a 50 y.o. female withinvasive mammary carcinoma of the left breast anatomical stage III Bc4N 1M0 ER/PR positive and HER-2 negative. She is s/p 4 cycles of neoadjuvant dose dense AC chemotherapy and here for on treatment assessment prior to cycle 7 of weekly Taxol chemotherapy  Counts okay to proceed with cycle 7 of weekly Taxol chemotherapy today.  She will directly proceed for cycle 8 next week and I will see her back in 2 weeks for cycle 9.  Given her toe sensitivity and concern for neuropathy I will reduce the dose of Taxol to 65 mg per metered squared moving forward.  Patient does have bilateral erythematous rash involving both her hands which seems to be extending into her bilateral forearms.  Possibly secondary to Taxol.  It almost has the appearance of hand-foot syndrome.  I will prescribe her urea cream for this.  Patient reports relief with lidocaine cream for her vulvar pain which we will continue.  Left breast pain and bilateral chest wall pain: I have asked her to use 1 to 2 tablets of oxycodone if need be.  Heartburn: We will give her a prescription for pantoprazole 20 mg daily    Visit Diagnosis 1. Encounter for antineoplastic chemotherapy   2. Drug-induced skin rash   3. Neoplasm related pain   4. Malignant neoplasm of upper-outer quadrant of left breast in female, estrogen receptor positive ([place])   5. Heartburn      Dr. Randa Evens, MD, MPH [facility] at [facility] [phone] 01/14/2021 12:54 PM

## 2021-01-16 NOTE — Telephone Encounter (Signed)
close

## 2021-01-19 ENCOUNTER — Encounter: Payer: Self-pay | Admitting: Family

## 2021-01-20 ENCOUNTER — Encounter: Payer: Self-pay | Admitting: Oncology

## 2021-01-20 ENCOUNTER — Other Ambulatory Visit: Payer: Self-pay

## 2021-01-20 DIAGNOSIS — C50412 Malignant neoplasm of upper-outer quadrant of left female breast: Secondary | ICD-10-CM

## 2021-01-20 NOTE — Progress Notes (Signed)
Daily Session Note  Patient Details  Name: Tricia Berry MRN: [id number] Date of Birth: 1970/11/18 Referring Provider:    Encounter Date: 01/20/2021  Check In:  Session Check In - 01/20/21 1228      Check-In   Supervising physician immediately available to respond to emergencies See telemetry face sheet for immediately available ER MD    Location [facility]    Staff Present Birdie Sons, MPA, Elveria Rising, BA, ACSM CEP, Exercise Physiologist;Colleen Mel Almond, RN, BSN    Virtual Visit No    Medication changes reported     No    Fall or balance concerns reported    No    Tobacco Cessation No Change    Current number of cigarettes/nicotine per day     5    Warm-up and Cool-down Performed on first and last piece of equipment    Resistance Training Performed Yes    VAD Patient? No    PAD/SET Patient? No      Pain Assessment   Currently in Pain? No/denies              Social History   Tobacco Use  Smoking Status Current Every Day Smoker  . Packs/day: 0.50  Smokeless Tobacco Never Used    Goals Met:  Independence with exercise equipment Exercise tolerated well No report of cardiac concerns or symptoms Strength training completed today  Goals Unmet:  Not Applicable  Comments: Pt able to follow exercise prescription today without complaint.  Will continue to monitor for progression.    Dr. Emily Filbert is Medical Director for [place] and [facility].

## 2021-01-21 ENCOUNTER — Other Ambulatory Visit (HOSPITAL_COMMUNITY)
Admission: RE | Admit: 2021-01-21 | Discharge: 2021-01-21 | Disposition: A | Discharge status: Home / Self Care | Payer: No Typology Code available for payment source | Source: Ambulatory Visit | Attending: Family | Admitting: Family

## 2021-01-21 ENCOUNTER — Other Ambulatory Visit: Payer: Self-pay | Admitting: Oncology

## 2021-01-21 ENCOUNTER — Encounter: Payer: Self-pay | Admitting: Family

## 2021-01-21 ENCOUNTER — Ambulatory Visit (INDEPENDENT_AMBULATORY_CARE_PROVIDER_SITE_OTHER): Payer: No Typology Code available for payment source | Admitting: Family

## 2021-01-21 ENCOUNTER — Other Ambulatory Visit: Payer: Self-pay

## 2021-01-21 ENCOUNTER — Inpatient Hospital Stay: Payer: No Typology Code available for payment source

## 2021-01-21 ENCOUNTER — Inpatient Hospital Stay: Payer: No Typology Code available for payment source | Attending: Oncology

## 2021-01-21 VITALS — BP 132/78 | HR 99 | Temp 97.5°F | Ht 61.0 in | Wt 128.2 lb

## 2021-01-21 VITALS — BP 109/78 | HR 92 | Temp 98.7°F | Resp 18

## 2021-01-21 DIAGNOSIS — C50412 Malignant neoplasm of upper-outer quadrant of left female breast: Secondary | ICD-10-CM

## 2021-01-21 DIAGNOSIS — R7989 Other specified abnormal findings of blood chemistry: Secondary | ICD-10-CM

## 2021-01-21 DIAGNOSIS — Z170 Estrogen receptor positive status [ER+]: Secondary | ICD-10-CM

## 2021-01-21 DIAGNOSIS — Z5111 Encounter for antineoplastic chemotherapy: Secondary | ICD-10-CM | POA: Insufficient documentation

## 2021-01-21 DIAGNOSIS — B3731 Acute candidiasis of vulva and vagina: Secondary | ICD-10-CM

## 2021-01-21 DIAGNOSIS — Z79899 Other long term (current) drug therapy: Secondary | ICD-10-CM | POA: Diagnosis not present

## 2021-01-21 DIAGNOSIS — B373 Candidiasis of vulva and vagina: Secondary | ICD-10-CM | POA: Insufficient documentation

## 2021-01-21 DIAGNOSIS — R319 Hematuria, unspecified: Secondary | ICD-10-CM | POA: Diagnosis not present

## 2021-01-21 LAB — COMPREHENSIVE METABOLIC PANEL
ALT: 36 U/L (ref 0–44)
AST: 35 U/L (ref 15–41)
Albumin: 3.5 g/dL (ref 3.5–5.0)
Alkaline Phosphatase: 55 U/L (ref 38–126)
Anion gap: 8 (ref 5–15)
BUN: 10 mg/dL (ref 6–20)
CO2: 26 mmol/L (ref 22–32)
Calcium: 9 mg/dL (ref 8.9–10.3)
Chloride: 104 mmol/L (ref 98–111)
Creatinine, Ser: 0.83 mg/dL (ref 0.44–1.00)
GFR, Estimated: >60 mL/min (ref >60)
Glucose, Bld: 148 mg/dL — ABNORMAL HIGH (ref 70–99)
Potassium: 3.7 mmol/L (ref 3.5–5.1)
Sodium: 138 mmol/L (ref 135–145)
Total Bilirubin: 0.5 mg/dL (ref 0.3–1.2)
Total Protein: 6.4 g/dL — ABNORMAL LOW (ref 6.5–8.1)

## 2021-01-21 LAB — URINALYSIS, ROUTINE W REFLEX MICROSCOPIC
Bilirubin Urine: NEGATIVE
Ketones, ur: NEGATIVE
Leukocytes,Ua: NEGATIVE
Nitrite: NEGATIVE
Specific Gravity, Urine: 1.01 (ref 1.000–1.030)
Total Protein, Urine: NEGATIVE
Urine Glucose: NEGATIVE
Urobilinogen, UA: 0.2 (ref 0.0–1.0)
pH: 5.5 (ref 5.0–8.0)

## 2021-01-21 LAB — CBC WITH DIFFERENTIAL/PLATELET
Abs Immature Granulocytes: 0.1 10*3/uL — ABNORMAL HIGH (ref 0.00–0.07)
Basophils Absolute: 0 10*3/uL (ref 0.0–0.1)
Basophils Relative: 1 %
Eosinophils Absolute: 0.1 10*3/uL (ref 0.0–0.5)
Eosinophils Relative: 2 %
HCT: 34.9 % — ABNORMAL LOW (ref 36.0–46.0)
Hemoglobin: 11.4 g/dL — ABNORMAL LOW (ref 12.0–15.0)
Immature Granulocytes: 2 %
Lymphocytes Relative: 31 %
Lymphs Abs: 1.6 10*3/uL (ref 0.7–4.0)
MCH: 33.3 pg (ref 26.0–34.0)
MCHC: 32.7 g/dL (ref 30.0–36.0)
MCV: 102 fL — ABNORMAL HIGH (ref 80.0–100.0)
Monocytes Absolute: 0.5 10*3/uL (ref 0.1–1.0)
Monocytes Relative: 10 %
Neutro Abs: 2.9 10*3/uL (ref 1.7–7.7)
Neutrophils Relative %: 54 %
Platelets: 220 10*3/uL (ref 150–400)
RBC: 3.42 MIL/uL — ABNORMAL LOW (ref 3.87–5.11)
RDW: 15.6 % — ABNORMAL HIGH (ref 11.5–15.5)
WBC: 5.3 10*3/uL (ref 4.0–10.5)
nRBC: 0 % (ref 0.0–0.2)

## 2021-01-21 LAB — TSH: TSH: 1.44 u[IU]/mL (ref 0.35–4.50)

## 2021-01-21 LAB — T3, FREE: T3, Free: 3.6 pg/mL (ref 2.3–4.2)

## 2021-01-21 LAB — T4, FREE: Free T4: 1.12 ng/dL (ref 0.60–1.60)

## 2021-01-21 MED ORDER — SODIUM CHLORIDE 0.9 % IV SOLN
Freq: Once | INTRAVENOUS | Status: AC
  Filled 2021-01-21: qty 250

## 2021-01-21 MED ORDER — OXYCODONE HCL 5 MG PO TABS
5.0000 mg | ORAL_TABLET | Freq: Four times a day (QID) | ORAL | 0 refills | Status: DC | PRN
Start: 2021-01-21 — End: 2021-01-24

## 2021-01-21 MED ORDER — SODIUM CHLORIDE 0.9 % IV SOLN
20.0000 mg | Freq: Once | INTRAVENOUS | Status: AC
  Administered 2021-01-21: 20 mg via INTRAVENOUS
  Filled 2021-01-21: qty 20

## 2021-01-21 MED ORDER — FAMOTIDINE 20 MG IN NS 100 ML IVPB
20.0000 mg | Freq: Once | INTRAVENOUS | Status: AC
  Administered 2021-01-21: 20 mg via INTRAVENOUS
  Filled 2021-01-21: qty 20

## 2021-01-21 MED ORDER — SODIUM CHLORIDE 0.9 % IV SOLN
65.0000 mg/m2 | Freq: Once | INTRAVENOUS | Status: AC
  Administered 2021-01-21: 102 mg via INTRAVENOUS
  Filled 2021-01-21: qty 17

## 2021-01-21 MED ORDER — DIPHENHYDRAMINE HCL 50 MG/ML IJ SOLN
50.0000 mg | Freq: Once | INTRAMUSCULAR | Status: AC
  Administered 2021-01-21: 50 mg via INTRAVENOUS
  Filled 2021-01-21: qty 1

## 2021-01-21 MED ORDER — SODIUM CHLORIDE 0.9% FLUSH
10.0000 mL | Freq: Once | INTRAVENOUS | Status: AC
  Administered 2021-01-21: 10 mL via INTRAVENOUS
  Filled 2021-01-21: qty 10

## 2021-01-21 MED ORDER — HEPARIN SOD (PORK) LOCK FLUSH 100 UNIT/ML IV SOLN
500.0000 [IU] | Freq: Once | INTRAVENOUS | Status: AC | PRN
  Administered 2021-01-21: 500 [IU]
  Filled 2021-01-21: qty 5

## 2021-01-21 MED ORDER — HEPARIN SOD (PORK) LOCK FLUSH 100 UNIT/ML IV SOLN
INTRAVENOUS | Status: AC
  Filled 2021-01-21: qty 5

## 2021-01-21 MED ORDER — AMOXICILLIN-POT CLAVULANATE 875-125 MG PO TABS: 1.0000 | ORAL_TABLET | Freq: Two times a day (BID) | ORAL | 0 refills | Status: AC

## 2021-01-21 NOTE — Telephone Encounter (Signed)
Pt scheduled today at 12p.

## 2021-01-21 NOTE — Progress Notes (Signed)
Subjective:    Patient ID: Tricia Berry, female    DOB: [birth year], 50 y.o.   MRN: [id number]  CC: Tricia Berry is a 50 y.o. female who presents today for an acute visit.    HPI: 10 days ago seen for vaginal pain, given diflucan 150mg , xylocaine 5% ointment with temporary relief. She felt improvement however today presents after chemo infusion this morning regarding continued vaginal pain. No distinct dysuria. No fever, vaginal discharge, nausea, vomiting.  She is not using vaginal replens at this time.    Ecoli seen urine culture 11 days ago, sensitive to augmentin.Uses oxycodone which is helpful for pain as prescribed by Dr Janese Banks.   Completed augmentin yesterday   Left breast cancer, chemo is weekly x 4 more weeks.    HISTORY:  Past Medical History:  Diagnosis Date  . Anxiety   . Breast cancer ([place])   . Diverticulitis   . Family history of ovarian cancer   . GERD (gastroesophageal reflux disease)   . IBS (irritable bowel syndrome)   . Scoliosis    Past Surgical History:  Procedure Laterality Date  . ABDOMINAL HYSTERECTOMY     still has ovaries, no gyn cancer, hysterectomy due to endometriosis. NO cervix on exam 01/10/21  . BREAST BIOPSY Left 09/15/2020   [place] bx of mass, path pending, Q marker  . BREAST BIOPSY Left 09/15/2020   [place] bx of LN, hydromarker, path pending  . IR IMAGING GUIDED PORT INSERTION  10/01/2020   Family History  Problem Relation Age of Onset  . Hypertension Mother   . Osteoarthritis Mother   . Diverticulitis Mother   . Heart failure Father   . Hypertension Father   . Gout Father   . Diverticulitis Brother   . Ovarian cancer Paternal Grandmother     Allergies: Morphine, Sertraline hcl, Sulfamethoxazole, and Sulfonamide derivatives Current Outpatient Medications on File Prior to Visit  Medication Sig Dispense Refill  . acetaminophen (TYLENOL) 325 MG tablet Take 650 mg by mouth every 6 (six) hours as needed.    Marland Kitchen dexamethasone (DECADRON)  4 MG tablet Take 2 tablets (8 mg total) by mouth daily. Take daily for 3 days after chemo. Take with food. 30 tablet 1  . DULoxetine (CYMBALTA) 30 MG capsule Take 2 capsules (60 mg total) by mouth daily. 120 capsule 3  . fluticasone (FLONASE) 50 MCG/ACT nasal spray Place 1 spray into both nostrils daily.    . Lactobacillus (PROBIOTIC ACIDOPHILUS PO) Take 1 capsule by mouth daily.    Marland Kitchen lidocaine (XYLOCAINE) 5 % ointment Apply 1 application topically as needed. 35.44 g 1  . lidocaine-prilocaine (EMLA) cream Apply to affected area once 30 g 3  . loratadine (CLARITIN) 10 MG tablet Take 10 mg by mouth daily.    Marland Kitchen LORazepam (ATIVAN) 0.5 MG tablet Take 1 tablet (0.5 mg total) by mouth every 6 (six) hours as needed (Nausea or vomiting). 30 tablet 0  . Multiple Vitamins-Minerals (MULTIVITAL PO) Take 1 Dose by mouth daily.    . nicotine (NICODERM CQ - DOSED IN MG/24 HOURS) 21 mg/24hr patch Place 1 patch (21 mg total) onto the skin daily. 28 patch 0  . nicotine (NICOTROL) 10 MG inhaler Inhale 1 Cartridge (1 continuous puffing total) into the lungs as needed for smoking cessation. 42 each 0  . nicotine polacrilex (NICOTINE MINI) 4 MG lozenge Take 1 lozenge (4 mg total) by mouth as needed for smoking cessation. 100 tablet 0  . pantoprazole (PROTONIX) 20  MG tablet Take 1 tablet (20 mg total) by mouth daily. 30 tablet 2  . urea (CARMOL) 10 % cream Apply topically as needed. Use on hands, arm and areas of red spots bid 71 g 0  . cyclobenzaprine (FLEXERIL) 10 MG tablet Take 1 tablet (10 mg total) by mouth 3 (three) times daily as needed for muscle spasms. (Patient not taking: Reported on 01/21/2021) 30 tablet 0  . fluconazole (DIFLUCAN) 100 MG tablet Take 100 mg by mouth daily. (Patient not taking: Reported on 01/21/2021)    . ondansetron (ZOFRAN) 8 MG tablet Take 1 tablet (8 mg total) by mouth 2 (two) times daily as needed. Start on the third day after chemotherapy. (Patient not taking: Reported on 01/21/2021) 30 tablet 1   . prochlorperazine (COMPAZINE) 10 MG tablet Take 1 tablet (10 mg total) by mouth every 6 (six) hours as needed (Nausea or vomiting). (Patient not taking: Reported on 01/21/2021) 30 tablet 1   No current facility-administered medications on file prior to visit.    Social History   Tobacco Use  . Smoking status: Current Every Day Smoker    Packs/day: 0.50  . Smokeless tobacco: Never Used  Vaping Use  . Vaping Use: Never used  Substance Use Topics  . Alcohol use: Not Currently  . Drug use: No    Review of Systems  Constitutional: Negative for chills and fever.  Respiratory: Negative for cough.   Cardiovascular: Negative for chest pain and palpitations.  Gastrointestinal: Negative for abdominal pain, nausea and vomiting.  Genitourinary: Positive for vaginal pain. Negative for difficulty urinating, frequency, pelvic pain and vaginal discharge.      Objective:    BP 132/78   Pulse 99   Temp (!) 97.5 F (36.4 C)   Ht 5\' 1"  (1.549 m)   Wt 128 lb 3.2 oz (58.2 kg)   SpO2 98%   BMI 24.22 kg/m    Physical Exam Vitals reviewed.  Constitutional:      Appearance: She is well-developed.  Eyes:     Conjunctiva/sclera: Conjunctivae normal.  Cardiovascular:     Rate and Rhythm: Normal rate and regular rhythm.     Pulses: Normal pulses.     Heart sounds: Normal heart sounds.  Pulmonary:     Effort: Pulmonary effort is normal.     Breath sounds: Normal breath sounds. No wheezing, rhonchi or rales.  Genitourinary:    Labia:        Right: Tenderness present. No rash.        Left: Tenderness present. No rash.      Vagina: Vaginal discharge and tenderness present. No bleeding.     Comments: No rash, lesions labia.  Moderate white discharge seen vaginal canal. Overall discomfort with speculum exam.  Skin:    General: Skin is warm and dry.  Neurological:     Mental Status: She is alert.  Psychiatric:        Speech: Speech normal.        Behavior: Behavior normal.        Thought  Content: Thought content normal.        Assessment & Plan:   Problem List Items Addressed This Visit      Genitourinary   Candidal vulvovaginitis - Primary    Symptoms consistent with continued vulvovaginal candidiasis however Im concerned that chemotherapy is exacerbating vaginal dryness, discomfort. She cannot take exogenous estrogen. We agreed to treat with repeat course of diflucan and short 5 day course of augmentin while  we await labs from today. If negative, she will reach out to Silvana Newness, CMN, regarding alternative options for treatment.       Relevant Medications   amoxicillin-clavulanate (AUGMENTIN) 875-125 MG tablet   Other Relevant Orders   Urinalysis, Routine w reflex microscopic   Urine Culture   Cervicovaginal ancillary only    Other Visit Diagnoses    Abnormal TSH       Hematuria, unspecified type            I am having Tricia G. Marschall "[name]" start on amoxicillin-clavulanate. I am also having her maintain her cyclobenzaprine, Multiple Vitamins-Minerals (MULTIVITAL PO), Lactobacillus (PROBIOTIC ACIDOPHILUS PO), lidocaine-prilocaine, ondansetron, prochlorperazine, acetaminophen, nicotine, dexamethasone, DULoxetine, nicotine polacrilex, nicotine, LORazepam, fluconazole, loratadine, fluticasone, lidocaine, pantoprazole, and urea.   Meds ordered this encounter  Medications  . amoxicillin-clavulanate (AUGMENTIN) 875-125 MG tablet    Sig: Take 1 tablet by mouth 2 (two) times daily for 5 days.    Dispense:  10 tablet    Refill:  0    Order Specific Question:   Supervising Provider    Answer:   Crecencio Mc [[id number]]    Return precautions given.   Risks, benefits, and alternatives of the medications and treatment plan prescribed today were discussed, and patient expressed understanding.   Education regarding symptom management and diagnosis given to patient on AVS.  Continue to follow with Burnard Hawthorne, FNP for routine health maintenance.    Alden Benjamin Obie and I agreed with plan.   Mable Paris, FNP

## 2021-01-21 NOTE — Assessment & Plan Note (Signed)
Symptoms consistent with continued vulvovaginal candidiasis however Im concerned that chemotherapy is exacerbating vaginal dryness, discomfort. She cannot take exogenous estrogen. We agreed to treat with repeat course of diflucan and short 5 day course of augmentin while we await labs from today. If negative, she will reach out to Silvana Newness, CMN, regarding alternative options for treatment.

## 2021-01-22 ENCOUNTER — Encounter: Payer: Self-pay | Admitting: Oncology

## 2021-01-22 LAB — URINE CULTURE
MICRO NUMBER:: 11721350
Result:: NO GROWTH
SPECIMEN QUALITY:: ADEQUATE

## 2021-01-23 LAB — CERVICOVAGINAL ANCILLARY ONLY
Bacterial Vaginitis (gardnerella): NEGATIVE
Candida Glabrata: NEGATIVE
Candida Vaginitis: NEGATIVE
Chlamydia: NEGATIVE
Comment: NEGATIVE
Comment: NEGATIVE
Comment: NEGATIVE
Comment: NEGATIVE
Comment: NEGATIVE
Comment: NORMAL
Neisseria Gonorrhea: NEGATIVE
Trichomonas: NEGATIVE

## 2021-01-24 ENCOUNTER — Other Ambulatory Visit: Payer: Self-pay | Admitting: *Deleted

## 2021-01-24 NOTE — Telephone Encounter (Signed)
Error

## 2021-01-25 ENCOUNTER — Other Ambulatory Visit: Payer: No Typology Code available for payment source

## 2021-01-25 ENCOUNTER — Other Ambulatory Visit: Payer: Self-pay

## 2021-01-25 MED ORDER — OXYCODONE HCL 5 MG PO TABS: ORAL_TABLET | ORAL | 0 refills | Status: DC

## 2021-01-27 ENCOUNTER — Encounter: Payer: No Typology Code available for payment source | Attending: Family | Admitting: *Deleted

## 2021-01-27 ENCOUNTER — Other Ambulatory Visit: Payer: Self-pay

## 2021-01-27 DIAGNOSIS — C50412 Malignant neoplasm of upper-outer quadrant of left female breast: Secondary | ICD-10-CM | POA: Insufficient documentation

## 2021-01-27 DIAGNOSIS — Z170 Estrogen receptor positive status [ER+]: Secondary | ICD-10-CM | POA: Insufficient documentation

## 2021-01-27 NOTE — Progress Notes (Signed)
Daily Session Note  Patient Details  Name: JAQUALA FULLER MRN: [id number] Date of Birth: Aug 28, 1971 Referring Provider:    Encounter Date: 01/27/2021  Check In:  Session Check In - 01/27/21 1223      Check-In   Location [facility]    Staff Present Alberteen Sam, MA, RCEP, CCRP, Marylynn Pearson, [place] Exercise Physiologist    Virtual Visit No    Medication changes reported     No    Fall or balance concerns reported    No    Warm-up and Cool-down Performed on first and last piece of equipment    Resistance Training Performed Yes    VAD Patient? No    PAD/SET Patient? No      Pain Assessment   Currently in Pain? No/denies              Social History   Tobacco Use  Smoking Status Current Every Day Smoker  . Packs/day: 0.50  Smokeless Tobacco Never Used    Goals Met:  Independence with exercise equipment Exercise tolerated well No report of cardiac concerns or symptoms Strength training completed today  Goals Unmet:  Not Applicable  Comments: Pt able to follow exercise prescription today without complaint.  Will continue to monitor for progression.    Dr. Emily Filbert is Medical Director for [place] and [facility].

## 2021-01-28 ENCOUNTER — Inpatient Hospital Stay (HOSPITAL_BASED_OUTPATIENT_CLINIC_OR_DEPARTMENT_OTHER): Payer: No Typology Code available for payment source | Admitting: Oncology

## 2021-01-28 ENCOUNTER — Inpatient Hospital Stay: Payer: No Typology Code available for payment source

## 2021-01-28 ENCOUNTER — Encounter: Payer: Self-pay | Admitting: Oncology

## 2021-01-28 ENCOUNTER — Other Ambulatory Visit: Payer: Self-pay

## 2021-01-28 VITALS — BP 127/93 | HR 92 | Temp 96.7°F | Resp 16 | Wt 128.6 lb

## 2021-01-28 DIAGNOSIS — Z5111 Encounter for antineoplastic chemotherapy: Secondary | ICD-10-CM | POA: Diagnosis not present

## 2021-01-28 DIAGNOSIS — C50412 Malignant neoplasm of upper-outer quadrant of left female breast: Secondary | ICD-10-CM

## 2021-01-28 DIAGNOSIS — Z170 Estrogen receptor positive status [ER+]: Secondary | ICD-10-CM

## 2021-01-28 DIAGNOSIS — L270 Generalized skin eruption due to drugs and medicaments taken internally: Secondary | ICD-10-CM

## 2021-01-28 DIAGNOSIS — G893 Neoplasm related pain (acute) (chronic): Secondary | ICD-10-CM | POA: Diagnosis not present

## 2021-01-28 LAB — CBC WITH DIFFERENTIAL/PLATELET
Abs Immature Granulocytes: 0.07 10*3/uL (ref 0.00–0.07)
Basophils Absolute: 0 10*3/uL (ref 0.0–0.1)
Basophils Relative: 1 %
Eosinophils Absolute: 0.1 10*3/uL (ref 0.0–0.5)
Eosinophils Relative: 2 %
HCT: 34.8 % — ABNORMAL LOW (ref 36.0–46.0)
Hemoglobin: 11.7 g/dL — ABNORMAL LOW (ref 12.0–15.0)
Immature Granulocytes: 1 %
Lymphocytes Relative: 32 %
Lymphs Abs: 1.9 10*3/uL (ref 0.7–4.0)
MCH: 34.6 pg — ABNORMAL HIGH (ref 26.0–34.0)
MCHC: 33.6 g/dL (ref 30.0–36.0)
MCV: 103 fL — ABNORMAL HIGH (ref 80.0–100.0)
Monocytes Absolute: 0.6 10*3/uL (ref 0.1–1.0)
Monocytes Relative: 11 %
Neutro Abs: 3 10*3/uL (ref 1.7–7.7)
Neutrophils Relative %: 53 %
Platelets: 243 10*3/uL (ref 150–400)
RBC: 3.38 MIL/uL — ABNORMAL LOW (ref 3.87–5.11)
RDW: 15.5 % (ref 11.5–15.5)
WBC: 5.7 10*3/uL (ref 4.0–10.5)
nRBC: 0 % (ref 0.0–0.2)

## 2021-01-28 LAB — COMPREHENSIVE METABOLIC PANEL
ALT: 34 U/L (ref 0–44)
AST: 35 U/L (ref 15–41)
Albumin: 3.4 g/dL — ABNORMAL LOW (ref 3.5–5.0)
Alkaline Phosphatase: 58 U/L (ref 38–126)
Anion gap: 9 (ref 5–15)
BUN: 7 mg/dL (ref 6–20)
CO2: 25 mmol/L (ref 22–32)
Calcium: 8.9 mg/dL (ref 8.9–10.3)
Chloride: 103 mmol/L (ref 98–111)
Creatinine, Ser: 0.74 mg/dL (ref 0.44–1.00)
GFR, Estimated: >60 mL/min (ref >60)
Glucose, Bld: 150 mg/dL — ABNORMAL HIGH (ref 70–99)
Potassium: 3.6 mmol/L (ref 3.5–5.1)
Sodium: 137 mmol/L (ref 135–145)
Total Bilirubin: 0.4 mg/dL (ref 0.3–1.2)
Total Protein: 6.6 g/dL (ref 6.5–8.1)

## 2021-01-28 MED ORDER — HEPARIN SOD (PORK) LOCK FLUSH 100 UNIT/ML IV SOLN
500.0000 [IU] | Freq: Once | INTRAVENOUS | Status: AC | PRN
  Administered 2021-01-28: 500 [IU]
  Filled 2021-01-28: qty 5

## 2021-01-28 MED ORDER — DEXAMETHASONE SODIUM PHOSPHATE 100 MG/10ML IJ SOLN
20.0000 mg | Freq: Once | INTRAMUSCULAR | Status: AC
  Administered 2021-01-28: 20 mg via INTRAVENOUS
  Filled 2021-01-28: qty 20

## 2021-01-28 MED ORDER — DIPHENHYDRAMINE HCL 50 MG/ML IJ SOLN
50.0000 mg | Freq: Once | INTRAMUSCULAR | Status: AC
  Administered 2021-01-28: 50 mg via INTRAVENOUS
  Filled 2021-01-28: qty 1

## 2021-01-28 MED ORDER — SODIUM CHLORIDE 0.9 % IV SOLN
65.0000 mg/m2 | Freq: Once | INTRAVENOUS | Status: AC
  Administered 2021-01-28: 102 mg via INTRAVENOUS
  Filled 2021-01-28: qty 17

## 2021-01-28 MED ORDER — FAMOTIDINE 20 MG IN NS 100 ML IVPB
20.0000 mg | Freq: Once | INTRAVENOUS | Status: AC
  Administered 2021-01-28: 20 mg via INTRAVENOUS
  Filled 2021-01-28: qty 20
  Filled 2021-01-28: qty 100

## 2021-01-28 MED ORDER — SODIUM CHLORIDE 0.9 % IV SOLN
Freq: Once | INTRAVENOUS | Status: AC
Start: 2021-01-28 — End: 2021-01-28
  Filled 2021-01-28: qty 250

## 2021-01-28 MED ORDER — HEPARIN SOD (PORK) LOCK FLUSH 100 UNIT/ML IV SOLN
INTRAVENOUS | Status: AC
  Filled 2021-01-28: qty 5

## 2021-01-28 NOTE — Progress Notes (Signed)
Hematology/Oncology Consult note [facility]  Telephone:([phone] [phone]  Patient Care Team: Burnard Hawthorne, FNP as PCP - General (Family Medicine) Rico Junker, RN as Oncology Nurse Navigator   Name of the patient: Tricia Berry  [id number]  14-May-1971   Date of visit: 01/28/21  Diagnosis- invasive mammary carcinoma of the left breast anatomical stage III BC T4N 1M0 ER/PR positive HER-2 negative  Chief complaint/ Reason for visit-on treatment assessment prior to cycle 9 of weekly Taxol chemotherapy  Heme/Onc history: patient is a 50 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 She then noticed that her lump is gradually getting larger with an area of ulceration on the skin and underwent a diagnostic bilateral mammogram on 09/08/2020 which showed hypoechoic interconnected masses in the left breast from 10:00 to 2 o'clock position spanning at least an area of 4.8 cm. Ultrasound also showed 5 abnormal lymph nodes in the axilla with cortical thickening. Both the mass and the lymph nodes were biopsied and was consistent with invasive mammary carcinoma grade 2. Tumor was ER +91- 100%, PR +11 to 20% and HER-2 negative.Ki-67 15%.Patient will be meeting [name] from [facility] surgery to discuss surgical management. She is here for medical oncology recommendations. In terms of her general health patient is otherwise doing well and does not have significant comorbidities. She does endorse significant pain in the area of her left breast. Tylenol and Motrin has not been helping her with this pain.She lives with her husband and 2 children who have special needs. No prior history of abnormal breast mammograms or breast biopsies.  PET CT scan showed hypermetabolism in the area  of the left breast but no evidence of hypermetabolism in the left axillaOr evidence of distant metastatic disease.  Neoadjuvant dose dense ACT chemotherapy started on 10/08/2020.Interim ultrasound after 4 cycles of dose dense AC chemotherapy showed overall decrease in volume of the tumor. Nodularity previously seen on ultrasound was also decreased in size.   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  Erythematous rash in her bilateral hands is gradually improving.  Urea cream has been helping.  Vulvar pain improving.  Has occasional chest wall pain and left-sided breast pain for which she uses as needed oxycodone.  ECOG PS- 1 Pain scale- 3 Opioid associated constipation- no  Review of systems- Review of Systems  Constitutional: Negative for chills, fever, malaise/fatigue and weight loss.  HENT: Negative for congestion, ear discharge and nosebleeds.   Eyes: Negative for blurred vision.  Respiratory: Negative for cough, hemoptysis, sputum production, shortness of breath and wheezing.   Cardiovascular: Negative for chest pain, palpitations, orthopnea and claudication.  Gastrointestinal: Negative for abdominal pain, blood in stool, constipation, diarrhea, heartburn, melena, nausea and vomiting.  Genitourinary: Negative for dysuria, flank pain, frequency, hematuria and urgency.  Musculoskeletal: Negative for back pain, joint pain and myalgias.  Skin: Negative for rash.  Neurological: Negative for dizziness, tingling, focal weakness, seizures, weakness and headaches.  Endo/Heme/Allergies: Does not bruise/bleed easily.  Psychiatric/Behavioral: Negative for depression and suicidal ideas. The patient does not have insomnia.       Allergies  Allergen Reactions  . Morphine Other (See Comments)    migranes  . Sertraline Hcl     REACTION: Worsened symptoms of IBS  . Sulfamethoxazole Rash  .  Sulfonamide Derivatives Rash     Past Medical History:  Diagnosis Date  .  Anxiety   . Breast cancer ([place])   . Diverticulitis   . Family history of ovarian cancer   . GERD (gastroesophageal reflux disease)   . IBS (irritable bowel syndrome)   . Scoliosis      Past Surgical History:  Procedure Laterality Date  . ABDOMINAL HYSTERECTOMY     still has ovaries, no gyn cancer, hysterectomy due to endometriosis. NO cervix on exam 01/10/21  . BREAST BIOPSY Left 09/15/2020   [place] bx of mass, path pending, Q marker  . BREAST BIOPSY Left 09/15/2020   [place] bx of LN, hydromarker, path pending  . IR IMAGING GUIDED PORT INSERTION  10/01/2020    Social History   Socioeconomic History  . Marital status: Divorced    Spouse name: Not on file  . Number of children: 2  . Years of education: Not on file  . Highest education level: Not on file  Occupational History  . Occupation: Optician, dispensing: [employer]  Tobacco Use  . Smoking status: Current Every Day Smoker    Packs/day: 0.50  . Smokeless tobacco: Never Used  Vaping Use  . Vaping Use: Never used  Substance and Sexual Activity  . Alcohol use: Not Currently  . Drug use: No  . Sexual activity: Not Currently    Birth control/protection: None  Other Topics Concern  . 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 She and her spouse are primary caregivers.    Social Determinants of Health   Financial Resource Strain: Not on file  Food Insecurity: Not on file  Transportation Needs: Not on file  Physical Activity: Not on file  Stress: Not on file  Social Connections: Not on file  Intimate Partner Violence: Not on file    Family History  Problem Relation Age of Onset  . Hypertension Mother   . Osteoarthritis Mother   . Diverticulitis Mother   . Heart failure Father   . Hypertension Father   . Gout Father   . Diverticulitis Brother   . Ovarian cancer Paternal Grandmother      Current Outpatient Medications:  .   acetaminophen (TYLENOL) 325 MG tablet, Take 650 mg by mouth every 6 (six) hours as needed., Disp: , Rfl:  .  cyclobenzaprine (FLEXERIL) 10 MG tablet, Take 1 tablet (10 mg total) by mouth 3 (three) times daily as needed for muscle spasms. (Patient not taking: Reported on 01/21/2021), Disp: 30 tablet, Rfl: 0 .  dexamethasone (DECADRON) 4 MG tablet, TAKE 2 TABLETS BY MOUTH DAILY. TAKE DAILY FOR 3 DAYS AFTER CHEMO. TAKE WITH FOOD., Disp: 30 tablet, Rfl: 1 .  DULoxetine (CYMBALTA) 30 MG capsule, Take 2 capsules (60 mg total) by mouth daily., Disp: 120 capsule, Rfl: 3 .  fluconazole (DIFLUCAN) 100 MG tablet, Take 100 mg by mouth daily. (Patient not taking: Reported on 01/21/2021), Disp: , Rfl:  .  fluticasone (FLONASE) 50 MCG/ACT nasal spray, Place 1 spray into both nostrils daily., Disp: , Rfl:  .  Lactobacillus (PROBIOTIC ACIDOPHILUS PO), Take 1 capsule by mouth daily., Disp: , Rfl:  .  lidocaine (XYLOCAINE) 5 % ointment, Apply 1 application topically as needed., Disp: 35.44 g, Rfl: 1 .  lidocaine-prilocaine (EMLA) cream, Apply to affected area once, Disp: 30 g, Rfl: 3 .  loratadine (CLARITIN) 10  MG tablet, Take 10 mg by mouth daily., Disp: , Rfl:  .  LORazepam (ATIVAN) 0.5 MG tablet, TAKE 1 TABLET BY MOUTH EVERY 6 HOURS AS NEEDED (NAUSEA OR VOMITING)., Disp: 30 tablet, Rfl: 0 .  Multiple Vitamins-Minerals (MULTIVITAL PO), Take 1 Dose by mouth daily., Disp: , Rfl:  .  nicotine (NICODERM CQ - DOSED IN MG/24 HOURS) 21 mg/24hr patch, PLACE 1 PATCH (21 MG TOTAL) ONTO THE SKIN DAILY., Disp: 28 patch, Rfl: 0 .  nicotine (NICODERM CQ - DOSED IN MG/24 HOURS) 21 mg/24hr patch, USE AS DIRECTED, Disp: 28 patch, Rfl: 0 .  nicotine (NICOTROL) 10 MG inhaler, FOLLOW INSTRUCTIONS ON PACKAGE AS NEEDED FOR SMOKING CESSATION, Disp: 168 each, Rfl: 0 .  nicotine polacrilex (COMMIT) 4 MG lozenge, TAKE 1 LOZENGE (4 MG TOTAL) BY MOUTH AS NEEDED FOR SMOKING CESSATION., Disp: 100 lozenge, Rfl: 0 .  ondansetron (ZOFRAN) 8 MG tablet,  Take 1 tablet (8 mg total) by mouth 2 (two) times daily as needed. Start on the third day after chemotherapy. (Patient not taking: Reported on 01/21/2021), Disp: 30 tablet, Rfl: 1 .  oxyCODONE (OXY IR/ROXICODONE) 5 MG immediate release tablet, 1-2 tablets every 6 hours as needed for pain, Disp: 240 tablet, Rfl: 0 .  pantoprazole (PROTONIX) 20 MG tablet, Take 1 tablet (20 mg total) by mouth daily., Disp: 30 tablet, Rfl: 2 .  prochlorperazine (COMPAZINE) 10 MG tablet, Take 1 tablet (10 mg total) by mouth every 6 (six) hours as needed (Nausea or vomiting). (Patient not taking: Reported on 01/21/2021), Disp: 30 tablet, Rfl: 1 .  urea (CARMOL) 10 % cream, Apply topically as needed. Use on hands, arm and areas of red spots bid, Disp: 71 g, Rfl: 0  Physical exam:  Vitals:   01/28/21 0848  BP: (!) 127/93  Pulse: 92  Resp: 16  Temp: (!) 96.7 F (35.9 C)  TempSrc: Tympanic  SpO2: 99%  Weight: 128 lb 9.6 oz (58.3 kg)   Physical Exam Cardiovascular:     Rate and Rhythm: Normal rate and regular rhythm.     Heart sounds: Normal heart sounds.  Pulmonary:     Effort: Pulmonary effort is normal.     Breath sounds: Normal breath sounds.  Skin:    General: Skin is warm and dry.     Comments: Erythematous rash noted over bilateral hands with some extension onto the forearm  Neurological:     Mental Status: She is alert and oriented to person, place, and time.      CMP Latest Ref Rng & Units 01/28/2021  Glucose 70 - 99 mg/dL 150(H)  BUN 6 - 20 mg/dL 7  Creatinine 0.44 - 1.00 mg/dL 0.74  Sodium 135 - 145 mmol/L 137  Potassium 3.5 - 5.1 mmol/L 3.6  Chloride 98 - 111 mmol/L 103  CO2 22 - 32 mmol/L 25  Calcium 8.9 - 10.3 mg/dL 8.9  Total Protein 6.5 - 8.1 g/dL 6.6  Total Bilirubin 0.3 - 1.2 mg/dL 0.4  Alkaline Phos 38 - 126 U/L 58  AST 15 - 41 U/L 35  ALT 0 - 44 U/L 34   CBC Latest Ref Rng & Units 01/28/2021  WBC 4.0 - 10.5 K/uL 5.7  Hemoglobin 12.0 - 15.0 g/dL 11.7(L)  Hematocrit 36.0 - 46.0 %  34.8(L)  Platelets 150 - 400 K/uL 243      Assessment and plan- Patient is a 50 y.o. female withinvasive mammary carcinoma of the left breast anatomical stage III Bc4N 1M0 ER/PR positive and  HER-2 negative. She is s/p 4 cycles of neoadjuvant dose dense AC chemotherapy.  She is here for on treatment assessment prior to cycle 9 of weekly Taxol chemotherapy  Counts okay to proceed with cycle 9 of weekly Taxol chemotherapy today.  She will directly proceed for cycle 10 next week and I will see her back in 2 weeks for cycle 11.  Left breast pain and bilateral chest wall pain: Continue as needed oxycodone  Heartburn: Continue PPI  Skin rash: Continue urea cream   Visit Diagnosis 1. Encounter for antineoplastic chemotherapy   2. Malignant neoplasm of upper-outer quadrant of left breast in female, estrogen receptor positive ([place])   3. Neoplasm related pain   4. Drug-induced skin rash      Dr. Randa Evens, MD, MPH [facility] [phone] 01/28/2021 9:38 AM

## 2021-02-04 ENCOUNTER — Inpatient Hospital Stay: Payer: No Typology Code available for payment source

## 2021-02-04 VITALS — BP 120/77 | HR 94 | Temp 96.7°F | Resp 18 | Wt 127.1 lb

## 2021-02-04 DIAGNOSIS — C50412 Malignant neoplasm of upper-outer quadrant of left female breast: Secondary | ICD-10-CM

## 2021-02-04 DIAGNOSIS — Z5111 Encounter for antineoplastic chemotherapy: Secondary | ICD-10-CM | POA: Diagnosis not present

## 2021-02-04 DIAGNOSIS — Z170 Estrogen receptor positive status [ER+]: Secondary | ICD-10-CM

## 2021-02-04 LAB — COMPREHENSIVE METABOLIC PANEL
ALT: 33 U/L (ref 0–44)
AST: 28 U/L (ref 15–41)
Albumin: 3.9 g/dL (ref 3.5–5.0)
Alkaline Phosphatase: 55 U/L (ref 38–126)
Anion gap: 10 (ref 5–15)
BUN: 10 mg/dL (ref 6–20)
CO2: 24 mmol/L (ref 22–32)
Calcium: 9.2 mg/dL (ref 8.9–10.3)
Chloride: 103 mmol/L (ref 98–111)
Creatinine, Ser: 0.78 mg/dL (ref 0.44–1.00)
GFR, Estimated: >60 mL/min (ref >60)
Glucose, Bld: 165 mg/dL — ABNORMAL HIGH (ref 70–99)
Potassium: 3.7 mmol/L (ref 3.5–5.1)
Sodium: 137 mmol/L (ref 135–145)
Total Bilirubin: 0.4 mg/dL (ref 0.3–1.2)
Total Protein: 6.9 g/dL (ref 6.5–8.1)

## 2021-02-04 LAB — CBC WITH DIFFERENTIAL/PLATELET
Abs Immature Granulocytes: 0.09 10*3/uL — ABNORMAL HIGH (ref 0.00–0.07)
Basophils Absolute: 0 10*3/uL (ref 0.0–0.1)
Basophils Relative: 1 %
Eosinophils Absolute: 0.1 10*3/uL (ref 0.0–0.5)
Eosinophils Relative: 1 %
HCT: 38.2 % (ref 36.0–46.0)
Hemoglobin: 12.6 g/dL (ref 12.0–15.0)
Immature Granulocytes: 1 %
Lymphocytes Relative: 31 %
Lymphs Abs: 2.4 10*3/uL (ref 0.7–4.0)
MCH: 33.4 pg (ref 26.0–34.0)
MCHC: 33 g/dL (ref 30.0–36.0)
MCV: 101.3 fL — ABNORMAL HIGH (ref 80.0–100.0)
Monocytes Absolute: 0.7 10*3/uL (ref 0.1–1.0)
Monocytes Relative: 8 %
Neutro Abs: 4.5 10*3/uL (ref 1.7–7.7)
Neutrophils Relative %: 58 %
Platelets: 256 10*3/uL (ref 150–400)
RBC: 3.77 MIL/uL — ABNORMAL LOW (ref 3.87–5.11)
RDW: 15.5 % (ref 11.5–15.5)
WBC: 7.8 10*3/uL (ref 4.0–10.5)
nRBC: 0 % (ref 0.0–0.2)

## 2021-02-04 MED ORDER — DIPHENHYDRAMINE HCL 50 MG/ML IJ SOLN
50.0000 mg | Freq: Once | INTRAMUSCULAR | Status: AC
  Administered 2021-02-04: 50 mg via INTRAVENOUS
  Filled 2021-02-04: qty 1

## 2021-02-04 MED ORDER — FAMOTIDINE 20 MG IN NS 100 ML IVPB
20.0000 mg | Freq: Once | INTRAVENOUS | Status: AC
  Administered 2021-02-04: 20 mg via INTRAVENOUS
  Filled 2021-02-04: qty 20

## 2021-02-04 MED ORDER — HEPARIN SOD (PORK) LOCK FLUSH 100 UNIT/ML IV SOLN
INTRAVENOUS | Status: AC
  Filled 2021-02-04: qty 5

## 2021-02-04 MED ORDER — SODIUM CHLORIDE 0.9 % IV SOLN
65.0000 mg/m2 | Freq: Once | INTRAVENOUS | Status: AC
  Administered 2021-02-04: 102 mg via INTRAVENOUS
  Filled 2021-02-04: qty 17

## 2021-02-04 MED ORDER — HEPARIN SOD (PORK) LOCK FLUSH 100 UNIT/ML IV SOLN
500.0000 [IU] | Freq: Once | INTRAVENOUS | Status: AC
  Administered 2021-02-04: 500 [IU] via INTRAVENOUS
  Filled 2021-02-04: qty 5

## 2021-02-04 MED ORDER — SODIUM CHLORIDE 0.9% FLUSH
10.0000 mL | Freq: Once | INTRAVENOUS | Status: AC
  Administered 2021-02-04: 10 mL via INTRAVENOUS
  Filled 2021-02-04: qty 10

## 2021-02-04 MED ORDER — SODIUM CHLORIDE 0.9 % IV SOLN
20.0000 mg | Freq: Once | INTRAVENOUS | Status: AC
  Administered 2021-02-04: 20 mg via INTRAVENOUS
  Filled 2021-02-04: qty 20

## 2021-02-04 MED ORDER — HEPARIN SOD (PORK) LOCK FLUSH 100 UNIT/ML IV SOLN
500.0000 [IU] | Freq: Once | INTRAVENOUS | Status: DC | PRN
  Filled 2021-02-04: qty 5

## 2021-02-04 MED ORDER — SODIUM CHLORIDE 0.9 % IV SOLN
Freq: Once | INTRAVENOUS | Status: AC
  Filled 2021-02-04: qty 250

## 2021-02-08 ENCOUNTER — Other Ambulatory Visit: Payer: Self-pay

## 2021-02-08 DIAGNOSIS — Z170 Estrogen receptor positive status [ER+]: Secondary | ICD-10-CM

## 2021-02-08 DIAGNOSIS — C50412 Malignant neoplasm of upper-outer quadrant of left female breast: Secondary | ICD-10-CM

## 2021-02-08 NOTE — Progress Notes (Signed)
Daily Session Note  Patient Details  Name: BRITTAY MOGLE MRN: [id number] Date of Birth: 05-Jun-1971 Referring Provider:    Encounter Date: 02/08/2021  Check In:  Session Check In - 02/08/21 1349      Check-In   Supervising physician immediately available to respond to emergencies See telemetry face sheet for immediately available ER MD    Location [facility]    Staff Present Hope Budds RDN, Tawanna Solo, MS Exercise Physiologist    Virtual Visit No    Medication changes reported     No    Fall or balance concerns reported    No    Tobacco Cessation No Change    Warm-up and Cool-down Performed on first and last piece of equipment    Resistance Training Performed Yes    VAD Patient? No    PAD/SET Patient? No              Social History   Tobacco Use  Smoking Status Current Every Day Smoker  . Packs/day: 0.50  Smokeless Tobacco Never Used    Goals Met:  Independence with exercise equipment Exercise tolerated well No report of cardiac concerns or symptoms Strength training completed today  Goals Unmet:  Not Applicable  Comments: Pt able to follow exercise prescription today without complaint.  Will continue to monitor for progression.   Dr. Emily Filbert is Medical Director for [place] and [facility].

## 2021-02-10 ENCOUNTER — Other Ambulatory Visit: Payer: Self-pay

## 2021-02-10 DIAGNOSIS — C50412 Malignant neoplasm of upper-outer quadrant of left female breast: Secondary | ICD-10-CM

## 2021-02-10 DIAGNOSIS — Z170 Estrogen receptor positive status [ER+]: Secondary | ICD-10-CM

## 2021-02-10 NOTE — Progress Notes (Signed)
Daily Session Note  Patient Details  Name: Tricia Berry MRN: [id number] Date of Birth: [birth year] Referring Provider:    Encounter Date: 02/10/2021  Check In:  Session Check In - 02/10/21 1235      Check-In   Supervising physician immediately available to respond to emergencies See telemetry face sheet for immediately available ER MD    Location [facility]    Staff Present Birdie Sons, MPA, Elveria Rising, BA, ACSM CEP, Exercise Physiologist;Kara Eliezer Bottom, MS Exercise Physiologist    Virtual Visit No    Medication changes reported     No    Fall or balance concerns reported    No    Tobacco Cessation No Change    Current number of cigarettes/nicotine per day     5    Warm-up and Cool-down Performed on first and last piece of equipment    Resistance Training Performed Yes    VAD Patient? No    PAD/SET Patient? No      Pain Assessment   Currently in Pain? No/denies              Social History   Tobacco Use  Smoking Status Current Every Day Smoker  . Packs/day: 0.50  Smokeless Tobacco Never Used    Goals Met:  Independence with exercise equipment Exercise tolerated well No report of cardiac concerns or symptoms Strength training completed today  Goals Unmet:  Not Applicable  Comments: Pt able to follow exercise prescription today without complaint.  Will continue to monitor for progression.    Dr. Emily Filbert is Medical Director for [facility] and [facility].

## 2021-02-11 ENCOUNTER — Inpatient Hospital Stay: Payer: No Typology Code available for payment source

## 2021-02-11 ENCOUNTER — Other Ambulatory Visit: Payer: Self-pay

## 2021-02-11 ENCOUNTER — Inpatient Hospital Stay (HOSPITAL_BASED_OUTPATIENT_CLINIC_OR_DEPARTMENT_OTHER): Payer: No Typology Code available for payment source | Admitting: Oncology

## 2021-02-11 VITALS — BP 124/84 | HR 102 | Temp 96.0°F | Resp 20 | Wt 130.7 lb

## 2021-02-11 DIAGNOSIS — Z170 Estrogen receptor positive status [ER+]: Secondary | ICD-10-CM

## 2021-02-11 DIAGNOSIS — C50412 Malignant neoplasm of upper-outer quadrant of left female breast: Secondary | ICD-10-CM

## 2021-02-11 DIAGNOSIS — Z5111 Encounter for antineoplastic chemotherapy: Secondary | ICD-10-CM | POA: Diagnosis not present

## 2021-02-11 DIAGNOSIS — G620 Drug-induced polyneuropathy: Secondary | ICD-10-CM | POA: Diagnosis not present

## 2021-02-11 DIAGNOSIS — T451X5A Adverse effect of antineoplastic and immunosuppressive drugs, initial encounter: Secondary | ICD-10-CM

## 2021-02-11 DIAGNOSIS — Z95828 Presence of other vascular implants and grafts: Secondary | ICD-10-CM

## 2021-02-11 LAB — COMPREHENSIVE METABOLIC PANEL
ALT: 26 U/L (ref 0–44)
AST: 27 U/L (ref 15–41)
Albumin: 3.6 g/dL (ref 3.5–5.0)
Alkaline Phosphatase: 52 U/L (ref 38–126)
Anion gap: 10 (ref 5–15)
BUN: 8 mg/dL (ref 6–20)
CO2: 24 mmol/L (ref 22–32)
Calcium: 9.1 mg/dL (ref 8.9–10.3)
Chloride: 102 mmol/L (ref 98–111)
Creatinine, Ser: 0.73 mg/dL (ref 0.44–1.00)
GFR, Estimated: >60 mL/min (ref >60)
Glucose, Bld: 124 mg/dL — ABNORMAL HIGH (ref 70–99)
Potassium: 3.8 mmol/L (ref 3.5–5.1)
Sodium: 136 mmol/L (ref 135–145)
Total Bilirubin: 0.3 mg/dL (ref 0.3–1.2)
Total Protein: 6.4 g/dL — ABNORMAL LOW (ref 6.5–8.1)

## 2021-02-11 LAB — CBC WITH DIFFERENTIAL/PLATELET
Abs Immature Granulocytes: 0.06 10*3/uL (ref 0.00–0.07)
Basophils Absolute: 0.1 10*3/uL (ref 0.0–0.1)
Basophils Relative: 1 %
Eosinophils Absolute: 0.1 10*3/uL (ref 0.0–0.5)
Eosinophils Relative: 2 %
HCT: 37.4 % (ref 36.0–46.0)
Hemoglobin: 12.4 g/dL (ref 12.0–15.0)
Immature Granulocytes: 1 %
Lymphocytes Relative: 34 %
Lymphs Abs: 2.7 10*3/uL (ref 0.7–4.0)
MCH: 33.9 pg (ref 26.0–34.0)
MCHC: 33.2 g/dL (ref 30.0–36.0)
MCV: 102.2 fL — ABNORMAL HIGH (ref 80.0–100.0)
Monocytes Absolute: 0.7 10*3/uL (ref 0.1–1.0)
Monocytes Relative: 9 %
Neutro Abs: 4.5 10*3/uL (ref 1.7–7.7)
Neutrophils Relative %: 53 %
Platelets: 239 10*3/uL (ref 150–400)
RBC: 3.66 MIL/uL — ABNORMAL LOW (ref 3.87–5.11)
RDW: 15.3 % (ref 11.5–15.5)
WBC: 8.2 10*3/uL (ref 4.0–10.5)
nRBC: 0 % (ref 0.0–0.2)

## 2021-02-11 MED ORDER — ONDANSETRON HCL 8 MG PO TABS: 8.0000 mg | ORAL_TABLET | Freq: Two times a day (BID) | ORAL | 1 refills | Status: DC | PRN

## 2021-02-11 MED ORDER — HEPARIN SOD (PORK) LOCK FLUSH 100 UNIT/ML IV SOLN
500.0000 [IU] | Freq: Once | INTRAVENOUS | Status: AC
  Filled 2021-02-11: qty 5

## 2021-02-11 MED ORDER — HEPARIN SOD (PORK) LOCK FLUSH 100 UNIT/ML IV SOLN
500.0000 [IU] | Freq: Once | INTRAVENOUS | Status: AC | PRN
  Administered 2021-02-11: 500 [IU]
  Filled 2021-02-11: qty 5

## 2021-02-11 MED ORDER — SODIUM CHLORIDE 0.9% FLUSH
10.0000 mL | Freq: Once | INTRAVENOUS | Status: AC
  Administered 2021-02-11: 10 mL via INTRAVENOUS
  Filled 2021-02-11: qty 10

## 2021-02-11 MED ORDER — FAMOTIDINE 20 MG IN NS 100 ML IVPB
20.0000 mg | Freq: Once | INTRAVENOUS | Status: AC
  Administered 2021-02-11: 20 mg via INTRAVENOUS
  Filled 2021-02-11 (×2): qty 100

## 2021-02-11 MED ORDER — DIPHENHYDRAMINE HCL 50 MG/ML IJ SOLN
50.0000 mg | Freq: Once | INTRAMUSCULAR | Status: AC
  Administered 2021-02-11: 50 mg via INTRAVENOUS
  Filled 2021-02-11: qty 1

## 2021-02-11 MED ORDER — SODIUM CHLORIDE 0.9 % IV SOLN
20.0000 mg | Freq: Once | INTRAVENOUS | Status: AC
  Administered 2021-02-11: 20 mg via INTRAVENOUS
  Filled 2021-02-11: qty 20

## 2021-02-11 MED ORDER — SODIUM CHLORIDE 0.9 % IV SOLN
65.0000 mg/m2 | Freq: Once | INTRAVENOUS | Status: AC
  Administered 2021-02-11: 102 mg via INTRAVENOUS
  Filled 2021-02-11: qty 17

## 2021-02-11 MED ORDER — SODIUM CHLORIDE 0.9 % IV SOLN
Freq: Once | INTRAVENOUS | Status: AC
Start: 2021-02-11 — End: 2021-02-11
  Filled 2021-02-11: qty 250

## 2021-02-11 MED ORDER — HEPARIN SOD (PORK) LOCK FLUSH 100 UNIT/ML IV SOLN
INTRAVENOUS | Status: AC
  Filled 2021-02-11: qty 10

## 2021-02-11 NOTE — Progress Notes (Signed)
Pt received taxol infusion in clinic today. Tolerated well.

## 2021-02-12 NOTE — Progress Notes (Signed)
Hematology/Oncology Consult note [facility]  Telephone:([phone] [phone]  Patient Care Team: Burnard Hawthorne, FNP as PCP - General (Family Medicine) Rico Junker, RN as Oncology Nurse Navigator   Name of the patient: Tricia Berry  [id number]  12-15-1970   Date of visit: 02/12/21  Diagnosis- invasive mammary carcinoma of the left breast anatomical stage III BC T4N 1M0 ER/PR positive HER-2 negative  Chief complaint/ Reason for visit-on treatment assessment prior to cycle 11 of weekly Taxol chemotherapy  Heme/Onc history: patient is a 50 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 She then noticed that her lump is gradually getting larger with an area of ulceration on the skin and underwent a diagnostic bilateral mammogram on 09/08/2020 which showed hypoechoic interconnected masses in the left breast from 10:00 to 2 o'clock position spanning at least an area of 4.8 cm. Ultrasound also showed 5 abnormal lymph nodes in the axilla with cortical thickening. Both the mass and the lymph nodes were biopsied and was consistent with invasive mammary carcinoma grade 2. Tumor was ER +91- 100%, PR +11 to 20% and HER-2 negative.Ki-67 15%.Patient will be meeting [name] from [facility] surgery to discuss surgical management. She is here for medical oncology recommendations. In terms of her general health patient is otherwise doing well and does not have significant comorbidities. She does endorse significant pain in the area of her left breast. Tylenol and Motrin has not been helping her with this pain.She lives with her husband and 2 children who have special needs. No prior history of abnormal breast mammograms or breast biopsies.  PET CT scan showed hypermetabolism in the area  of the left breast but no evidence of hypermetabolism in the left axillaOr evidence of distant metastatic disease.  Neoadjuvant dose dense ACT chemotherapy started on 10/08/2020.Interim ultrasound after 4 cycles of dose dense AC chemotherapy showed overall decrease in volume of the tumor. Nodularity previously seen on ultrasound was also decreased in size.  Interval history-urinary symptoms have improved and vulvar pain has decreased.  She reports neuropathy in her bilateral big toe only at this time.  Reports ongoing fatigue.  Occasional nosebleeds which are self-limited.  ECOG PS- 1 Pain scale- 0 Opioid associated constipation- no  Review of systems- Review of Systems  Constitutional: Positive for malaise/fatigue. Negative for chills, fever and weight loss.  HENT: Positive for nosebleeds. Negative for congestion and ear discharge.   Eyes: Negative for blurred vision.  Respiratory: Negative for cough, hemoptysis, sputum production, shortness of breath and wheezing.   Cardiovascular: Negative for chest pain, palpitations, orthopnea and claudication.  Gastrointestinal: Negative for abdominal pain, blood in stool, constipation, diarrhea, heartburn, melena, nausea and vomiting.  Genitourinary: Negative for dysuria, flank pain, frequency, hematuria and urgency.  Musculoskeletal: Negative for back pain, joint pain and myalgias.  Skin: Negative for rash.  Neurological: Positive for sensory change (peripheral neuropathy). Negative for dizziness, tingling, focal weakness, seizures, weakness and headaches.  Endo/Heme/Allergies: Does not bruise/bleed easily.  Psychiatric/Behavioral: Negative for depression and suicidal ideas. The patient does not have insomnia.       Allergies  Allergen Reactions  . Morphine Other (See Comments)    migranes  . Sertraline Hcl     REACTION: Worsened symptoms of IBS  . Sulfamethoxazole Rash  . Sulfonamide Derivatives  Rash     Past Medical History:   Diagnosis Date  . Anxiety   . Breast cancer ([place])   . Diverticulitis   . Family history of ovarian cancer   . GERD (gastroesophageal reflux disease)   . IBS (irritable bowel syndrome)   . Scoliosis      Past Surgical History:  Procedure Laterality Date  . ABDOMINAL HYSTERECTOMY     still has ovaries, no gyn cancer, hysterectomy due to endometriosis. NO cervix on exam 01/10/21  . BREAST BIOPSY Left 09/15/2020   [place] bx of mass, path pending, Q marker  . BREAST BIOPSY Left 09/15/2020   [place] bx of LN, hydromarker, path pending  . IR IMAGING GUIDED PORT INSERTION  10/01/2020    Social History   Socioeconomic History  . Marital status: Divorced    Spouse name: Not on file  . Number of children: 2  . Years of education: Not on file  . Highest education level: Not on file  Occupational History  . Occupation: Optician, dispensing: [employer]  Tobacco Use  . Smoking status: Current Every Day Smoker    Packs/day: 0.50  . Smokeless tobacco: Never Used  Vaping Use  . Vaping Use: Never used  Substance and Sexual Activity  . Alcohol use: Not Currently  . Drug use: No  . Sexual activity: Not Currently    Birth control/protection: None  Other Topics Concern  . 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 She and her spouse are primary caregivers.    Social Determinants of Health   Financial Resource Strain: Not on file  Food Insecurity: Not on file  Transportation Needs: Not on file  Physical Activity: Not on file  Stress: Not on file  Social Connections: Not on file  Intimate Partner Violence: Not on file    Family History  Problem Relation Age of Onset  . Hypertension Mother   . Osteoarthritis Mother   . Diverticulitis Mother   . Heart failure Father   . Hypertension Father   . Gout Father   . Diverticulitis Brother   . Ovarian cancer Paternal Grandmother      Current Outpatient  Medications:  .  acetaminophen (TYLENOL) 325 MG tablet, Take 650 mg by mouth every 6 (six) hours as needed., Disp: , Rfl:  .  DULoxetine (CYMBALTA) 30 MG capsule, Take 2 capsules (60 mg total) by mouth daily., Disp: 120 capsule, Rfl: 3 .  fluticasone (FLONASE) 50 MCG/ACT nasal spray, Place 1 spray into both nostrils daily., Disp: , Rfl:  .  Lactobacillus (PROBIOTIC ACIDOPHILUS PO), Take 1 capsule by mouth daily., Disp: , Rfl:  .  lidocaine (XYLOCAINE) 5 % ointment, Apply 1 application topically as needed., Disp: 35.44 g, Rfl: 1 .  lidocaine-prilocaine (EMLA) cream, Apply to affected area once, Disp: 30 g, Rfl: 3 .  loratadine (CLARITIN) 10 MG tablet, Take 10 mg by mouth daily., Disp: , Rfl:  .  LORazepam (ATIVAN) 0.5 MG tablet, TAKE 1 TABLET BY MOUTH EVERY 6 HOURS AS NEEDED (NAUSEA OR VOMITING)., Disp: 30 tablet, Rfl: 0 .  nicotine (NICODERM CQ - DOSED IN MG/24 HOURS) 21 mg/24hr patch, PLACE 1 PATCH (21 MG TOTAL) ONTO THE SKIN DAILY., Disp: 28 patch, Rfl: 0 .  nicotine (NICODERM CQ - DOSED IN MG/24 HOURS) 21 mg/24hr patch, USE AS DIRECTED, Disp: 28 patch, Rfl: 0 .  nicotine (NICOTROL) 10 MG inhaler, FOLLOW INSTRUCTIONS ON PACKAGE AS NEEDED FOR SMOKING CESSATION, Disp: 168 each, Rfl: 0 .  nicotine polacrilex (COMMIT) 4 MG lozenge, TAKE 1 LOZENGE (4 MG TOTAL) BY MOUTH AS NEEDED FOR SMOKING CESSATION., Disp: 100 lozenge, Rfl: 0 .  ondansetron (ZOFRAN) 8 MG tablet, Take 1 tablet (8 mg total) by mouth 2 (two) times daily as needed. Start on the third day after chemotherapy., Disp: 30 tablet, Rfl: 1 .  oxyCODONE (OXY IR/ROXICODONE) 5 MG immediate release tablet, 1-2 tablets every 6 hours as needed for pain, Disp: 240 tablet, Rfl: 0 .  pantoprazole (PROTONIX) 20 MG tablet, Take 1 tablet (20 mg total) by mouth daily., Disp: 30 tablet, Rfl: 2 .  urea (CARMOL) 10 % cream, Apply topically as needed. Use on hands, arm and areas of red spots bid, Disp: 71 g, Rfl: 0 .  cyclobenzaprine (FLEXERIL) 10 MG tablet,  Take 1 tablet (10 mg total) by mouth 3 (three) times daily as needed for muscle spasms. (Patient not taking: No sig reported), Disp: 30 tablet, Rfl: 0 .  dexamethasone (DECADRON) 4 MG tablet, TAKE 2 TABLETS BY MOUTH DAILY. TAKE DAILY FOR 3 DAYS AFTER CHEMO. TAKE WITH FOOD. (Patient not taking: No sig reported), Disp: 30 tablet, Rfl: 1 .  fluconazole (DIFLUCAN) 100 MG tablet, Take 100 mg by mouth daily. (Patient not taking: No sig reported), Disp: , Rfl:  .  Multiple Vitamins-Minerals (MULTIVITAL PO), Take 1 Dose by mouth daily. (Patient not taking: No sig reported), Disp: , Rfl:  .  prochlorperazine (COMPAZINE) 10 MG tablet, Take 1 tablet (10 mg total) by mouth every 6 (six) hours as needed (Nausea or vomiting). (Patient not taking: No sig reported), Disp: 30 tablet, Rfl: 1 No current facility-administered medications for this visit.  Facility-Administered Medications Ordered in Other Visits:  .  heparin lock flush 100 unit/mL, 500 Units, Intravenous, Once, Sindy Guadeloupe, MD  Physical exam:  Vitals:   02/11/21 0909  BP: 124/84  Pulse: (!) 102  Resp: 20  Temp: (!) 96 F (35.6 C)  TempSrc: Tympanic  SpO2: 98%  Weight: 130 lb 11.2 oz (59.3 kg)   Physical Exam Constitutional:      General: She is not in acute distress. Cardiovascular:     Rate and Rhythm: Normal rate and regular rhythm.     Heart sounds: Normal heart sounds.  Pulmonary:     Effort: Pulmonary effort is normal.     Breath sounds: Normal breath sounds.  Abdominal:     General: Bowel sounds are normal.     Palpations: Abdomen is soft.  Skin:    General: Skin is warm and dry.  Neurological:     Mental Status: She is alert and oriented to person, place, and time.   Breast exam: No palpable left axillary adenopathy.  Tumor is adherent to the overlying skin but is overall decreased in size.  Roughly it measures about 3 cm.  CMP Latest Ref Rng & Units 02/11/2021  Glucose 70 - 99 mg/dL 124(H)  BUN 6 - 20 mg/dL 8   Creatinine 0.44 - 1.00 mg/dL 0.73  Sodium 135 - 145 mmol/L 136  Potassium 3.5 - 5.1 mmol/L 3.8  Chloride 98 - 111 mmol/L 102  CO2 22 - 32 mmol/L 24  Calcium 8.9 - 10.3 mg/dL 9.1  Total Protein 6.5 - 8.1 g/dL 6.4(L)  Total Bilirubin 0.3 - 1.2 mg/dL 0.3  Alkaline Phos 38 - 126 U/L 52  AST 15 - 41  U/L 27  ALT 0 - 44 U/L 26   CBC Latest Ref Rng & Units 02/11/2021  WBC 4.0 - 10.5 K/uL 8.2  Hemoglobin 12.0 - 15.0 g/dL 12.4  Hematocrit 36.0 - 46.0 % 37.4  Platelets 150 - 400 K/uL 239    Assessment and plan- Patient is a 50 y.o. female withinvasive mammary carcinoma of the left breast anatomical stage III Bc4N 1M0 ER/PR positive and HER-2 negative.She is s/p 4 cycles of neoadjuvant dose dense AC chemotherapy.    She is here for on treatment assessment prior to cycle 11 of weekly Taxol chemotherapy  Counts okay to proceed with cycle 11 of weekly Taxol chemotherapy today.  She can directly proceed with Taxol next week which would be her last chemotherapy and it would mark the end of her neoadjuvant chemotherapy.  We have personally called [name] office as well as I have sent him a message to coordinate her surgery at this time.  Patient is leaning towards a bilateral mastectomy with reconstruction.  She would not require any IV chemotherapy postoperatively but she would benefit from adjuvant bisphosphonates which will be given as an IV infusion every 6 months.  It may be okay to leave the port in at this time and taken out at a later date.  Patient would likely qualify for adjuvant CDK inhibitors along with hormone therapy postoperatively which I will discuss with her in greater detail after surgery.  I will see her back in 1 month with labs.  Left breast pain and chest wall pain: Continue as needed oxycodone  Chemo induced peripheral neuropathy: Currently mild continue to monitor   Visit Diagnosis 1. Encounter for antineoplastic chemotherapy   2. Malignant neoplasm of  upper-outer quadrant of left breast in female, estrogen receptor positive ([place])   3. Chemotherapy-induced peripheral neuropathy ([place])      Dr. Randa Evens, MD, MPH [facility] [phone] 02/12/2021 4:47 PM

## 2021-02-15 ENCOUNTER — Telehealth: Payer: Self-pay | Admitting: *Deleted

## 2021-02-15 ENCOUNTER — Other Ambulatory Visit: Payer: Self-pay

## 2021-02-15 DIAGNOSIS — C50412 Malignant neoplasm of upper-outer quadrant of left female breast: Secondary | ICD-10-CM

## 2021-02-15 NOTE — Progress Notes (Signed)
Daily Session Note  Patient Details  Name: Tricia Berry MRN: [id number] Date of Birth: 1971-05-03 Referring Provider:    Encounter Date: 02/15/2021  Check In:  Session Check In - 02/15/21 1237      Check-In   Supervising physician immediately available to respond to emergencies See telemetry face sheet for immediately available ER MD    Location [facility]    Staff Present Birdie Sons, MPA, RN;Jessica [place], MA, RCEP, CCRP, Marylynn Pearson, MS Exercise Physiologist    Virtual Visit No    Medication changes reported     No    Fall or balance concerns reported    No    Tobacco Cessation No Change    Current number of cigarettes/nicotine per day     5    Warm-up and Cool-down Performed on first and last piece of equipment    Resistance Training Performed Yes    VAD Patient? No    PAD/SET Patient? No      Pain Assessment   Currently in Pain? No/denies              Social History   Tobacco Use  Smoking Status Current Every Day Smoker  . Packs/day: 0.50  Smokeless Tobacco Never Used    Goals Met:  Independence with exercise equipment Exercise tolerated well No report of cardiac concerns or symptoms Strength training completed today  Goals Unmet:  Not Applicable  Comments: Pt able to follow exercise prescription today without complaint.  Will continue to monitor for progression.    Dr. Emily Filbert is Medical Director for [place] [facility].

## 2021-02-15 NOTE — Telephone Encounter (Signed)
Tricia Berry called today to say that they have an appointment for [name] to get surgery with [name] on May 12 through May 26.  Tricia Berry has reached out to [name] office but not got a return call yet.  I called today to [name] office and was told that patient has an appointment for May 10 at 9:20.  I asked Tricia Berry about whether or not she thought that [name] would have an opening for surgery if he only sees her on May 10 for the May 12.  She is not sure.  I called to [name] surgery and spoke to the secretary and she told me that I should fax the information about the appointments that [name] him can do and may be [name] could maybe make a sooner appointment for the patient in order for May 12 May 26 to work out so I have faxed that information over to [name] at [phone]. Fax was transmitted.  I called Tricia Berry and told her this information so she is waiting to either hear back from me or to hear back from [name] office if this could be worked out

## 2021-02-17 ENCOUNTER — Other Ambulatory Visit: Payer: Self-pay

## 2021-02-17 DIAGNOSIS — Z170 Estrogen receptor positive status [ER+]: Secondary | ICD-10-CM

## 2021-02-17 DIAGNOSIS — C50412 Malignant neoplasm of upper-outer quadrant of left female breast: Secondary | ICD-10-CM

## 2021-02-17 NOTE — Progress Notes (Signed)
Daily Session Note  Patient Details  Name: Tricia Berry MRN: [id number] Date of Birth: [birth year] Referring Provider:    Encounter Date: 02/17/2021  Check In:  Session Check In - 02/17/21 1244      Check-In   Supervising physician immediately available to respond to emergencies See telemetry face sheet for immediately available ER MD    Location [facility]    Staff Present Coralie Keens, MS Exercise Physiologist;Amanda Oletta Darter, [place], ACSM CEP, Exercise Physiologist    Virtual Visit No    Medication changes reported     No    Fall or balance concerns reported    No    Tobacco Cessation No Change    Current number of cigarettes/nicotine per day     5    Warm-up and Cool-down Performed on first and last piece of equipment    Resistance Training Performed --   Yoga   VAD Patient? No    PAD/SET Patient? No              Social History   Tobacco Use  Smoking Status Current Every Day Smoker  . Packs/day: 0.50  Smokeless Tobacco Never Used    Goals Met:  Exercise tolerated well Queuing for purse lip breathing No report of cardiac concerns or symptoms  Goals Unmet:  Not Applicable  Comments: Pt able to follow exercise prescription today without complaint.  Will continue to monitor for progression.  Yoga session completed today by Exercise Physiologist, Nada Maclachlan, CEP.    Dr. Emily Filbert is Medical Director for [facility].

## 2021-02-18 ENCOUNTER — Inpatient Hospital Stay: Payer: No Typology Code available for payment source

## 2021-02-18 ENCOUNTER — Other Ambulatory Visit: Payer: Self-pay | Admitting: Oncology

## 2021-02-18 ENCOUNTER — Other Ambulatory Visit: Payer: Self-pay

## 2021-02-18 ENCOUNTER — Ambulatory Visit: Payer: No Typology Code available for payment source

## 2021-02-18 VITALS — BP 132/77 | HR 102 | Temp 97.3°F | Resp 18

## 2021-02-18 DIAGNOSIS — Z170 Estrogen receptor positive status [ER+]: Secondary | ICD-10-CM

## 2021-02-18 DIAGNOSIS — Z5111 Encounter for antineoplastic chemotherapy: Secondary | ICD-10-CM | POA: Diagnosis not present

## 2021-02-18 DIAGNOSIS — C50412 Malignant neoplasm of upper-outer quadrant of left female breast: Secondary | ICD-10-CM

## 2021-02-18 LAB — COMPREHENSIVE METABOLIC PANEL
ALT: 26 U/L (ref 0–44)
AST: 29 U/L (ref 15–41)
Albumin: 3.6 g/dL (ref 3.5–5.0)
Alkaline Phosphatase: 51 U/L (ref 38–126)
Anion gap: 11 (ref 5–15)
BUN: 8 mg/dL (ref 6–20)
CO2: 23 mmol/L (ref 22–32)
Calcium: 9.1 mg/dL (ref 8.9–10.3)
Chloride: 102 mmol/L (ref 98–111)
Creatinine, Ser: 0.79 mg/dL (ref 0.44–1.00)
GFR, Estimated: >60 mL/min (ref >60)
Glucose, Bld: 151 mg/dL — ABNORMAL HIGH (ref 70–99)
Potassium: 3.7 mmol/L (ref 3.5–5.1)
Sodium: 136 mmol/L (ref 135–145)
Total Bilirubin: 0.4 mg/dL (ref 0.3–1.2)
Total Protein: 6.3 g/dL — ABNORMAL LOW (ref 6.5–8.1)

## 2021-02-18 LAB — CBC WITH DIFFERENTIAL/PLATELET
Abs Immature Granulocytes: 0.07 10*3/uL (ref 0.00–0.07)
Basophils Absolute: 0 10*3/uL (ref 0.0–0.1)
Basophils Relative: 1 %
Eosinophils Absolute: 0.2 10*3/uL (ref 0.0–0.5)
Eosinophils Relative: 2 %
HCT: 37.6 % (ref 36.0–46.0)
Hemoglobin: 12.5 g/dL (ref 12.0–15.0)
Immature Granulocytes: 1 %
Lymphocytes Relative: 36 %
Lymphs Abs: 2.7 10*3/uL (ref 0.7–4.0)
MCH: 33.4 pg (ref 26.0–34.0)
MCHC: 33.2 g/dL (ref 30.0–36.0)
MCV: 100.5 fL — ABNORMAL HIGH (ref 80.0–100.0)
Monocytes Absolute: 0.7 10*3/uL (ref 0.1–1.0)
Monocytes Relative: 9 %
Neutro Abs: 4 10*3/uL (ref 1.7–7.7)
Neutrophils Relative %: 51 %
Platelets: 253 10*3/uL (ref 150–400)
RBC: 3.74 MIL/uL — ABNORMAL LOW (ref 3.87–5.11)
RDW: 15.2 % (ref 11.5–15.5)
WBC: 7.6 10*3/uL (ref 4.0–10.5)
nRBC: 0 % (ref 0.0–0.2)

## 2021-02-18 MED ORDER — FAMOTIDINE 20 MG IN NS 100 ML IVPB
20.0000 mg | Freq: Once | INTRAVENOUS | Status: AC
  Administered 2021-02-18: 20 mg via INTRAVENOUS
  Filled 2021-02-18: qty 20

## 2021-02-18 MED ORDER — SODIUM CHLORIDE 0.9 % IV SOLN
Freq: Once | INTRAVENOUS | Status: AC
Start: 2021-02-18 — End: 2021-02-18
  Filled 2021-02-18: qty 250

## 2021-02-18 MED ORDER — DIPHENHYDRAMINE HCL 50 MG/ML IJ SOLN
50.0000 mg | Freq: Once | INTRAMUSCULAR | Status: AC
  Administered 2021-02-18: 50 mg via INTRAVENOUS
  Filled 2021-02-18: qty 1

## 2021-02-18 MED ORDER — SODIUM CHLORIDE 0.9 % IV SOLN
20.0000 mg | Freq: Once | INTRAVENOUS | Status: AC
  Administered 2021-02-18: 20 mg via INTRAVENOUS
  Filled 2021-02-18: qty 20

## 2021-02-18 MED ORDER — HEPARIN SOD (PORK) LOCK FLUSH 100 UNIT/ML IV SOLN
INTRAVENOUS | Status: AC
  Filled 2021-02-18: qty 5

## 2021-02-18 MED ORDER — HEPARIN SOD (PORK) LOCK FLUSH 100 UNIT/ML IV SOLN
500.0000 [IU] | Freq: Once | INTRAVENOUS | Status: AC | PRN
  Administered 2021-02-18: 500 [IU]
  Filled 2021-02-18: qty 5

## 2021-02-18 MED ORDER — SODIUM CHLORIDE 0.9 % IV SOLN
65.0000 mg/m2 | Freq: Once | INTRAVENOUS | Status: AC
  Administered 2021-02-18: 102 mg via INTRAVENOUS
  Filled 2021-02-18: qty 17

## 2021-02-18 MED FILL — Nicotine Polacrilex Lozenge 4 MG: OROMUCOSAL | 20 days supply | Qty: 100 | Fill #0 | Status: CN

## 2021-02-18 NOTE — Progress Notes (Signed)
HR 102. Per Dr. Janese Banks, okay to proceed with treatment.

## 2021-02-18 NOTE — Patient Instructions (Signed)
[facility]  Discharge Instructions: Thank you for choosing [facility] to provide your oncology and hematology care.  If you have a lab appointment with the [facility], please go directly to the [facility] and check in at the registration area.  Wear comfortable clothing and clothing appropriate for easy access to any Portacath or PICC line.   We strive to give you quality time with your provider. You may need to reschedule your appointment if you arrive late (15 or more minutes).  Arriving late affects you and other patients whose appointments are after yours.  Also, if you miss three or more appointments without notifying the office, you may be dismissed from the clinic at the provider's discretion.      For prescription refill requests, have your pharmacy contact our office and allow 72 hours for refills to be completed.    Today you received the following chemotherapy and/or immunotherapy agents TaxolPaclitaxel injection What is this medicine? PACLITAXEL (PAK li TAX el) is a chemotherapy drug. It targets fast dividing cells, like cancer cells, and causes these cells to die. This medicine is used to treat ovarian cancer, breast cancer, lung cancer, Kaposi's sarcoma, and other cancers. This medicine may be used for other purposes; ask your health care provider or pharmacist if you have questions. COMMON BRAND NAME(S): Onxol, Taxol What should I tell my health care provider before I take this medicine? They need to know if you have any of these conditions:  history of irregular heartbeat  liver disease  low blood counts, like low white cell, platelet, or red cell counts  lung or breathing disease, like asthma  tingling of the fingers or toes, or other nerve disorder  an unusual or allergic reaction to paclitaxel, alcohol, polyoxyethylated castor oil, other chemotherapy, other medicines, foods, dyes, or preservatives  pregnant or  trying to get pregnant  breast-feeding How should I use this medicine? This drug is given as an infusion into a vein. It is administered in a hospital or clinic by a specially trained health care professional. Talk to your pediatrician regarding the use of this medicine in children. Special care may be needed. Overdosage: If you think you have taken too much of this medicine contact a poison control center or emergency room at once. NOTE: This medicine is only for you. Do not share this medicine with others. What if I miss a dose? It is important not to miss your dose. Call your doctor or health care professional if you are unable to keep an appointment. What may interact with this medicine? Do not take this medicine with any of the following medications:  live virus vaccines This medicine may also interact with the following medications:  antiviral medicines for hepatitis, HIV or AIDS  certain antibiotics like erythromycin and clarithromycin  certain medicines for fungal infections like ketoconazole and itraconazole  certain medicines for seizures like carbamazepine, phenobarbital, phenytoin  gemfibrozil  nefazodone  rifampin  St. John's wort This list may not describe all possible interactions. Give your health care provider a list of all the medicines, herbs, non-prescription drugs, or dietary supplements you use. Also tell them if you smoke, drink alcohol, or use illegal drugs. Some items may interact with your medicine. What should I watch for while using this medicine? Your condition will be monitored carefully while you are receiving this medicine. You will need important blood work done while you are taking this medicine. This medicine can cause serious  allergic reactions. To reduce your risk you will need to take other medicine(s) before treatment with this medicine. If you experience allergic reactions like skin rash, itching or hives, swelling of the face, lips, or  tongue, tell your doctor or health care professional right away. In some cases, you may be given additional medicines to help with side effects. Follow all directions for their use. This drug may make you feel generally unwell. This is not uncommon, as chemotherapy can affect healthy cells as well as cancer cells. Report any side effects. Continue your course of treatment even though you feel ill unless your doctor tells you to stop. Call your doctor or health care professional for advice if you get a fever, chills or sore throat, or other symptoms of a cold or flu. Do not treat yourself. This drug decreases your body's ability to fight infections. Try to avoid being around people who are sick. This medicine may increase your risk to bruise or bleed. Call your doctor or health care professional if you notice any unusual bleeding. Be careful brushing and flossing your teeth or using a toothpick because you may get an infection or bleed more easily. If you have any dental work done, tell your dentist you are receiving this medicine. Avoid taking products that contain aspirin, acetaminophen, ibuprofen, naproxen, or ketoprofen unless instructed by your doctor. These medicines may hide a fever. Do not become pregnant while taking this medicine. Women should inform their doctor if they wish to become pregnant or think they might be pregnant. There is a potential for serious side effects to an unborn child. Talk to your health care professional or pharmacist for more information. Do not breast-feed an infant while taking this medicine. Men are advised not to father a child while receiving this medicine. This product may contain alcohol. Ask your pharmacist or healthcare provider if this medicine contains alcohol. Be sure to tell all healthcare providers you are taking this medicine. Certain medicines, like metronidazole and disulfiram, can cause an unpleasant reaction when taken with alcohol. The reaction includes  flushing, headache, nausea, vomiting, sweating, and increased thirst. The reaction can last from 30 minutes to several hours. What side effects may I notice from receiving this medicine? Side effects that you should report to your doctor or health care professional as soon as possible:  allergic reactions like skin rash, itching or hives, swelling of the face, lips, or tongue  breathing problems  changes in vision  fast, irregular heartbeat  high or low blood pressure  mouth sores  pain, tingling, numbness in the hands or feet  signs of decreased platelets or bleeding - bruising, pinpoint red spots on the skin, black, tarry stools, blood in the urine  signs of decreased red blood cells - unusually weak or tired, feeling faint or lightheaded, falls  signs of infection - fever or chills, cough, sore throat, pain or difficulty passing urine  signs and symptoms of liver injury like dark yellow or brown urine; general ill feeling or flu-like symptoms; light-colored stools; loss of appetite; nausea; right upper belly pain; unusually weak or tired; yellowing of the eyes or skin  swelling of the ankles, feet, hands  unusually slow heartbeat Side effects that usually do not require medical attention (report to your doctor or health care professional if they continue or are bothersome):  diarrhea  hair loss  loss of appetite  muscle or joint pain  nausea, vomiting  pain, redness, or irritation at site where injected  tiredness This list may not describe all possible side effects. Call your doctor for medical advice about side effects. You may report side effects to FDA at 1-800-FDA-1088. Where should I keep my medicine? This drug is given in a hospital or clinic and will not be stored at home. NOTE: This sheet is a summary. It may not cover all possible information. If you have questions about this medicine, talk to your doctor, pharmacist, or health care provider.  2021  Elsevier/Gold Standard (2019-09-10 13:37:23)       To help prevent nausea and vomiting after your treatment, we encourage you to take your nausea medication as directed.  BELOW ARE SYMPTOMS THAT SHOULD BE REPORTED IMMEDIATELY: . *FEVER GREATER THAN 100.4 F (38 C) OR HIGHER . *CHILLS OR SWEATING . *NAUSEA AND VOMITING THAT IS NOT CONTROLLED WITH YOUR NAUSEA MEDICATION . *UNUSUAL SHORTNESS OF BREATH . *UNUSUAL BRUISING OR BLEEDING . *URINARY PROBLEMS (pain or burning when urinating, or frequent urination) . *BOWEL PROBLEMS (unusual diarrhea, constipation, pain near the anus) . TENDERNESS IN MOUTH AND THROAT WITH OR WITHOUT PRESENCE OF ULCERS (sore throat, sores in mouth, or a toothache) . UNUSUAL RASH, SWELLING OR PAIN  . UNUSUAL VAGINAL DISCHARGE OR ITCHING   Items with * indicate a potential emergency and should be followed up as soon as possible or go to the Emergency Department if any problems should occur.  Please show the CHEMOTHERAPY ALERT CARD or IMMUNOTHERAPY ALERT CARD at check-in to the Emergency Department and triage nurse.  Should you have questions after your visit or need to cancel or reschedule your appointment, please contact [facility]  [phone] and follow the prompts.  Office hours are 8:00 a.m. to 4:30 p.m. Monday - Friday. Please note that voicemails left after 4:00 p.m. may not be returned until the following business day.  We are closed weekends and major holidays. You have access to a nurse at all times for urgent questions. Please call the main number to the clinic [phone] and follow the prompts.  For any non-urgent questions, you may also contact your provider using MyChart. We now offer e-Visits for anyone 14 and older to request care online for non-urgent symptoms. For details visit [email or web address].   Also download the MyChart app! Go to the app store, search "MyChart", open the app, select [facility], and log  in with your MyChart username and password.  Due to Covid, a mask is required upon entering the hospital/clinic. If you do not have a mask, one will be given to you upon arrival. For doctor visits, patients may have 1 support person aged 39 or older with them. For treatment visits, patients cannot have anyone with them due to current Covid guidelines and our immunocompromised population.

## 2021-02-18 NOTE — Progress Notes (Signed)
Nutrition Follow-up:  Patient with left breast cancer.  Patient on taxol.  Planning surgery following treatment.  Met with patient during infusion today.  Patient reports that her appetite has returned and she is eating well. Reports that she is participating in the CARE program (meets 2 days per week) and tries to walk on off days.      Medications: reviewed  Labs: reviewed  Anthropometrics:   Weight 130 lb  117 lb on 1/28 last time seen by RD   NUTRITION DIAGNOSIS: Inadequate oral intake improved   INTERVENTION:  Encouraged good well balance diet including plant foods and lean protein.  RD available as needed    NEXT VISIT: no follow-up RD available as needed  Thierno Hun B. Zenia Resides, [place], [place] Registered Dietitian [phone] (mobile)

## 2021-02-21 ENCOUNTER — Other Ambulatory Visit: Payer: Self-pay

## 2021-02-21 ENCOUNTER — Telehealth: Payer: Self-pay | Admitting: Family

## 2021-02-21 ENCOUNTER — Other Ambulatory Visit: Payer: Self-pay | Admitting: Family

## 2021-02-21 DIAGNOSIS — F411 Generalized anxiety disorder: Secondary | ICD-10-CM

## 2021-02-21 DIAGNOSIS — M545 Low back pain, unspecified: Secondary | ICD-10-CM

## 2021-02-21 MED ORDER — NICOTINE POLACRILEX 4 MG MT LOZG
LOZENGE | OROMUCOSAL | 0 refills | Status: DC
  Filled 2021-02-21: qty 72, 7d supply, fill #0

## 2021-02-21 MED ORDER — DULOXETINE HCL 30 MG PO CPEP
ORAL_CAPSULE | ORAL | 2 refills | Status: DC
  Filled 2021-02-21: qty 60, 30d supply, fill #0

## 2021-02-21 MED ORDER — DULOXETINE HCL 60 MG PO CPEP: 60.0000 mg | ORAL_CAPSULE | Freq: Every day | ORAL | 3 refills | Status: DC

## 2021-02-21 NOTE — Telephone Encounter (Signed)
Duplicate

## 2021-02-21 NOTE — Telephone Encounter (Signed)
Call pt She has been on cymbalta 30mg  and would take two for 60mg  dose  I sent in 60mg  tablet to make easier. She will just take ONE 60mg  tablet daily

## 2021-02-21 NOTE — Telephone Encounter (Signed)
Patient called in for refill for DULoxetine (CYMBALTA) 30 MG capsule

## 2021-02-21 NOTE — Telephone Encounter (Signed)
Patient called and informed to only take the on 60mg  Cymbalta a day. Pt verbalized understanding.

## 2021-02-22 ENCOUNTER — Encounter: Payer: No Typology Code available for payment source | Attending: Family

## 2021-02-22 ENCOUNTER — Other Ambulatory Visit: Payer: Self-pay

## 2021-02-22 DIAGNOSIS — Z170 Estrogen receptor positive status [ER+]: Secondary | ICD-10-CM | POA: Insufficient documentation

## 2021-02-22 DIAGNOSIS — C50412 Malignant neoplasm of upper-outer quadrant of left female breast: Secondary | ICD-10-CM | POA: Insufficient documentation

## 2021-02-22 MED ORDER — NICOTINE 10 MG IN INHA
RESPIRATORY_TRACT | 0 refills | Status: DC
  Filled 2021-02-22: qty 168, 30d supply, fill #0
  Filled 2021-03-08 (×2): qty 168, 20d supply, fill #0

## 2021-02-22 NOTE — Progress Notes (Signed)
Daily Session Note  Patient Details  Name: Tricia Berry MRN: [id number] Date of Birth: 1971-08-31 Referring Provider:    Encounter Date: 02/22/2021  Check In:  Session Check In - 02/22/21 1239      Check-In   Supervising physician immediately available to respond to emergencies See telemetry face sheet for immediately available ER MD    Location [facility]    Staff Present Birdie Sons, MPA, RN;Jessica Luan Pulling, MA, RCEP, CCRP, Marylynn Pearson, MS Exercise Physiologist    Virtual Visit No    Medication changes reported     No    Fall or balance concerns reported    No    Tobacco Cessation No Change    Warm-up and Cool-down Performed on first and last piece of equipment    Resistance Training Performed Yes    VAD Patient? No    PAD/SET Patient? No      Pain Assessment   Currently in Pain? No/denies              Social History   Tobacco Use  Smoking Status Current Every Day Smoker  . Packs/day: 0.50  Smokeless Tobacco Never Used    Goals Met:  Independence with exercise equipment Exercise tolerated well No report of cardiac concerns or symptoms Strength training completed today  Goals Unmet:  Not Applicable  Comments: Pt able to follow exercise prescription today without complaint.  Will continue to monitor for progression.    Dr. Emily Filbert is Medical Director for [facility].

## 2021-02-23 ENCOUNTER — Telehealth: Payer: Self-pay | Admitting: *Deleted

## 2021-02-23 NOTE — Telephone Encounter (Signed)
Called [facility] and spoke to Azure who is covering for [employer] today. Asked if cornett got the message that dr Marla Roe had 2 dates / and / for surgery and wanted to see if that is ok with cornett and could they speak to each other with the plan. The appt for pt for cornett is 5/10. I was not sure if he sees her that close to 5/12 would they be able to do surgery 5/12? The other date [facility] had was 5/26. Lattie Haw has advised me to send the fax again to her attn and she will get back to me maybe tom. I left message to pt that we are still working on it and do not have answer except for what is on this message and pt already knew this info.

## 2021-02-24 ENCOUNTER — Other Ambulatory Visit: Payer: Self-pay

## 2021-02-24 ENCOUNTER — Other Ambulatory Visit: Payer: Self-pay | Admitting: Oncology

## 2021-02-24 DIAGNOSIS — C50412 Malignant neoplasm of upper-outer quadrant of left female breast: Secondary | ICD-10-CM

## 2021-02-24 MED ORDER — OXYCODONE HCL 5 MG PO TABS: ORAL_TABLET | ORAL | 0 refills | Status: DC

## 2021-02-24 NOTE — Progress Notes (Signed)
Daily Session Note  Patient Details  Name: Tricia Berry MRN: [id number] Date of Birth: [birth year] Referring Provider:    Encounter Date: 02/24/2021  Check In:  Session Check In - 02/24/21 1248      Check-In   Supervising physician immediately available to respond to emergencies See telemetry face sheet for immediately available ER MD    Location [facility]    Staff Present Birdie Sons, MPA, Elveria Rising, BA, ACSM CEP, Exercise Physiologist;Kara Eliezer Bottom, MS Exercise Physiologist    Virtual Visit No    Medication changes reported     No    Fall or balance concerns reported    No    Tobacco Cessation No Change    Warm-up and Cool-down Performed on first and last piece of equipment    Resistance Training Performed Yes    VAD Patient? No    PAD/SET Patient? No      Pain Assessment   Currently in Pain? No/denies              Social History   Tobacco Use  Smoking Status Current Every Day Smoker  . Packs/day: 0.50  Smokeless Tobacco Never Used    Goals Met:  Independence with exercise equipment Exercise tolerated well No report of cardiac concerns or symptoms Strength training completed today  Goals Unmet:  Not Applicable  Comments: Pt able to follow exercise prescription today without complaint.  Will continue to monitor for progression.    Dr. Emily Filbert is Medical Director for [facility].

## 2021-02-25 ENCOUNTER — Other Ambulatory Visit: Payer: Self-pay

## 2021-02-28 ENCOUNTER — Encounter: Payer: Self-pay | Admitting: *Deleted

## 2021-03-01 ENCOUNTER — Ambulatory Visit: Payer: Self-pay | Admitting: Surgery

## 2021-03-01 NOTE — H&P (View-Only) (Signed)
Tricia Berry Appointment: 03/01/2021 9:20 AM Location: [facility] Patient #: [id number] DOB: 07/02/1971 Divorced / Language: English / Race: White Female  History of Present Illness (Tricia Trant A. 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 She completed chemotherapy. She desires bilateral mastectomies therefore magnetic resonance imaging was not obtained. She states the mass is much smaller and the ulceration she had before is resolved. She is tired from chemotherapy but overall I think is faired remarkably well. She states she is done with her Port-A-Cath since chemotherapy is done. She desires reconstruction. She has no interest in preserving her nipples since the left nipple was involved with disease initially and needs to be removed.  The patient is a 49 year old female.   Problem List/Past Medical (Tricia Berry, [name]; 03/01/2021 9:34 AM) BREAST CANCER, LEFT (C50.912)  Past Surgical History (Tricia Berry, [name]; 03/01/2021 9:34 AM) Breast Biopsy Left. Hysterectomy (not due to cancer) - Partial Sentinel Lymph Node Biopsy  Diagnostic Studies History (Tricia Berry, [name]; 03/01/2021 9:34 AM) Colonoscopy >10 years ago Mammogram within last year Pap Smear >5 years ago  Allergies (Tricia Berry, [name]; 03/01/2021 9:34 AM) Sulfa Antibiotics  Medication History (Tricia Berry, [name]; 03/01/2021 9:36 AM) oxyCODONE HCl (5MG Tablet, Oral) Active. LORazepam (0.5MG Tablet, Oral) Active. Cyclobenzaprine HCl (10MG Tablet, Oral) Active. Dexamethasone (4MG Tablet, Oral) Active. DULoxetine HCl (60MG Capsule DR Part, Oral) Active. Ondansetron HCl (8MG Tablet, Oral) Active. Pantoprazole Sodium (20MG Tablet DR, Oral) Active. Lidocaine-Prilocaine (2.5-2.5% Cream, External) Active. Medications Reconciled  Social History  (Tricia Berry, [name]; 03/01/2021 9:34 AM) Caffeine use Coffee, Tea. No alcohol use No drug use Tobacco use Current every day smoker.  Family History (Tricia Berry, [name]; 03/01/2021 9:34 AM) Arthritis Brother, Father, Mother. Heart Disease Father. Hypertension Father, Mother. Ischemic Bowel Disease Brother, Mother. Kidney Disease Family Members In General. Migraine Headache Brother, Mother.  Pregnancy / Birth History (Tricia Berry, [name]; 03/01/2021 9:34 AM) Age at menarche 11 years. Age of menopause <45 Gravida 3 Maternal age 15-20 Para 2  Other Problems (Tricia Berry, [name]; 03/01/2021 9:34 AM) Anxiety Disorder Back Pain Breast Cancer Diverticulosis Gastroesophageal Reflux Disease Hemorrhoids Lump In Breast Migraine Headache     Physical Exam (Tricia Lachney A. Lurine Imel MD; 03/01/2021 12:52 PM)  General Note: Alopecia  Chest and Lung Exam Note: No wheezing, work of breathing normal  Breast Note: Left breast mass is not detectable today. Ulceration is healed over in the left lateral breast. Nipple was mildly constricted. There is mild retraction. Right breast is normal. Of note Port-A-Cath is in the middle of the right breast. No masses.  Cardiovascular Note: Normal sinus rhythm  Neurologic Neurologic evaluation reveals -alert and oriented x 3 with no impairment of recent or remote memory. Mental Status-Normal.    Assessment & Plan (Tricia Younce A. Bennie Chirico MD; 03/01/2021 12:53 PM)  BREAST CANCER, LEFT (C50.912) Impression: Total time 45 minutes discussing surgical options, reviewing chart, physical examination, reviewing mammogram and core biopsy results and discussing complications and long-term plans   Discussed bilateral mastectomy with left axillary lymph node dissection. She has seen plastic surgery and we will coordinate reconstruction. She has no interest in nipple preservation and therefore she will need a standard left  modified radical mastectomy and a risk reducing right mastectomy. Discussed treatment options for breast cancer to include breast conservation vs mastectomy with reconstruction. Pt has decided   on mastectomy. Risk include bleeding, infection, flap necrosis, pain, numbness, recurrence, hematoma, other surgery needs. Pt understands and agrees to proceed.  Current Plans Pt Education - [facility] Mastectomy HCI You are being scheduled for surgery- Our schedulers will call you.  You should hear from our office's scheduling department within 5 working days about the location, date, and time of surgery. We try to make accommodations for patient's preferences in scheduling surgery, but sometimes the OR schedule or the surgeon's schedule prevents us from making those accommodations.  If you have not heard from our office ([phone]) in 5 working days, call the office and ask for your surgeon's nurse.  If you have other questions about your diagnosis, plan, or surgery, call the office and ask for your surgeon's nurse.  Pt Education - [facility] Breast Cancer Information Given - [facility] "Breast Journey" Package Pt Education - [facility] (After Breast Cancer) Class Info: discussed with patient and provided information. Pt Education - [facility] Mastectomy HCI 

## 2021-03-01 NOTE — H&P (Signed)
Tricia Berry Appointment: 03/01/2021 9:20 AM Location: [facility] Patient #: [id number] DOB: [birth year] Divorced / Language: Tricia Berry / Race: White Female  History of Present Illness Tricia Moores A. 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 She completed chemotherapy. She desires bilateral mastectomies therefore magnetic resonance imaging was not obtained. She states the mass is much smaller and the ulceration she had before is resolved. She is tired from chemotherapy but overall I think is faired remarkably well. She states she is done with her Port-A-Cath since chemotherapy is done. She desires reconstruction. She has no interest in preserving her nipples since the left nipple was involved with disease initially and needs to be removed.  The patient is a 50 year old female.   Problem List/Past Medical Tricia Berry, [name]; 03/01/2021 9:34 AM) BREAST CANCER, LEFT (Tricia Berry)  Past Surgical History Tricia Berry, [name]; 03/01/2021 9:34 AM) Breast Biopsy Left. Hysterectomy (not due to cancer) - Partial Sentinel Lymph Node Biopsy  Diagnostic Studies History Tricia Berry, [name]; 03/01/2021 9:34 AM) Colonoscopy >10 years ago Mammogram within last year Pap Smear >5 years ago  Allergies Tricia Berry, [name]; 03/01/2021 9:34 AM) Sulfa Antibiotics  Medication History Tricia Berry, [place]; 03/01/2021 9:36 AM) oxyCODONE HCl (5MG Tablet, Oral) Active. LORazepam (0.5MG Tablet, Oral) Active. Cyclobenzaprine HCl (10MG Tablet, Oral) Active. Dexamethasone (4MG Tablet, Oral) Active. DULoxetine HCl (60MG Capsule DR Part, Oral) Active. Ondansetron HCl (8MG Tablet, Oral) Active. Pantoprazole Sodium (20MG Tablet DR, Oral) Active. Lidocaine-Prilocaine (2.5-2.5% Cream, External) Active. Medications Reconciled  Social History  Tricia Berry, [name]; 03/01/2021 9:34 AM) Caffeine use Coffee, Tea. No alcohol use No drug use Tobacco use Current every day smoker.  Family History Tricia Berry, [name]; 03/01/2021 9:34 AM) Arthritis Brother, Father, Mother. Heart Disease Father. Hypertension Father, Mother. Ischemic Bowel Disease Brother, Mother. Kidney Disease Family Members In General. Migraine Headache Brother, Mother.  Pregnancy / Birth History Tricia Berry, [name]; 03/01/2021 9:34 AM) Age at menarche 59 years. Age of menopause <44 Gravida 3 Maternal age 65-20 Para 2  Other Problems Tricia Berry, [name]; 03/01/2021 9:34 AM) Anxiety Disorder Back Pain Breast Cancer Diverticulosis Gastroesophageal Reflux Disease Hemorrhoids Lump In Breast Migraine Headache     Physical Exam (Tricia Berry A. Darren Caldron MD; 03/01/2021 12:52 PM)  General Note: Alopecia  Chest and Lung Exam Note: No wheezing, work of breathing normal  Breast Note: Left breast mass is not detectable today. Ulceration is healed over in the left lateral breast. Nipple was mildly constricted. There is mild retraction. Right breast is normal. Of note Port-A-Cath is in the middle of the right breast. No masses.  Cardiovascular Note: Normal sinus rhythm  Neurologic Neurologic evaluation reveals -alert and oriented x 3 with no impairment of recent or remote memory. Mental Status-Normal.    Assessment & Plan (Tricia Berry A. Tricia Schlup MD; 03/01/2021 12:53 PM)  BREAST CANCER, LEFT (C50.912) Impression: Total time 45 minutes discussing surgical options, reviewing chart, physical examination, reviewing mammogram and core biopsy results and discussing complications and long-term plans   Discussed bilateral mastectomy with left axillary lymph node dissection. She has seen plastic surgery and we will coordinate reconstruction. She has no interest in nipple preservation and therefore she will need a standard left  modified radical mastectomy and a risk reducing right mastectomy. Discussed treatment options for breast cancer to include breast conservation vs mastectomy with reconstruction. Pt has decided  on mastectomy. Risk include bleeding, infection, flap necrosis, pain, numbness, recurrence, hematoma, other surgery needs. Pt understands and agrees to proceed.  Current Plans Pt Education - [facility] Mastectomy HCI You are being scheduled for surgery- Our schedulers will call you.  You should hear from our office's scheduling department within 5 working days about the location, date, and time of surgery. We try to make accommodations for patient's preferences in scheduling surgery, but sometimes the OR schedule or the surgeon's schedule prevents [place] from making those accommodations.  If you have not heard from our office [phone]) in 5 working days, call the office and ask for your surgeon's nurse.  If you have other questions about your diagnosis, plan, or surgery, call the office and ask for your surgeon's nurse.  Pt Education - [facility] Breast Cancer Information Given - [facility] "Breast Journey" Package Pt Education - [facility] (After Breast Cancer) Class Info: discussed with patient and provided information. Pt Education - [facility] Mastectomy HCI

## 2021-03-02 ENCOUNTER — Ambulatory Visit: Payer: No Typology Code available for payment source | Admitting: Family

## 2021-03-02 ENCOUNTER — Encounter: Payer: Self-pay | Admitting: Surgical

## 2021-03-02 ENCOUNTER — Other Ambulatory Visit: Payer: Self-pay

## 2021-03-02 ENCOUNTER — Ambulatory Visit (INDEPENDENT_AMBULATORY_CARE_PROVIDER_SITE_OTHER): Payer: No Typology Code available for payment source | Admitting: Surgical

## 2021-03-02 VITALS — BP 116/78 | HR 99 | Ht 60.0 in | Wt 130.0 lb

## 2021-03-02 DIAGNOSIS — Z170 Estrogen receptor positive status [ER+]: Secondary | ICD-10-CM

## 2021-03-02 DIAGNOSIS — C50412 Malignant neoplasm of upper-outer quadrant of left female breast: Secondary | ICD-10-CM

## 2021-03-02 NOTE — Progress Notes (Signed)
Patient ID: Tricia Berry, female    DOB: 1970-12-13, 50 y.o.   MRN: [id number]  Chief Complaint  Patient presents with  . Pre-op Exam      ICD-10-CM   1. Malignant neoplasm of upper-outer quadrant of left breast in female, estrogen receptor positive ([place])  C50.412    Z17.0     History of Present Illness: Tricia Berry is a 50 y.o.  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  She presents for preoperative evaluation for upcoming procedure, immediate bilateral breast reconstruction placement of tissue expanders and Flex HD, scheduled for 03/17/21 with Dr. Marla Roe. Patient is undergoing bilateral mastectomies by [name] with general surgery  Patient reports a history of nausea and vomiting after anesthesia.No history of DVT/PE.  No family history of DVT/PE.  No family or personal history of bleeding or clotting disorders.  Patient is not currently taking any blood thinners.  No history of CVA/MI.   Summary of Previous Visit: Patient with recent diagnosis of invasive mammary carcinoma grade 2.  The tumor is estrogen and progesterone positive and HER2 negative.  She is planning on left mastectomy and is thinking about doing a right mastectomy as well.  She is currently on neoadjuvant chemotherapy which she will finish at the end of April.  She is working on quitting smoking.  Radiation is most likely planned.  She does not have diabetes.  She has a history of low back pain and prescription for pain meds.  Patient is aware that if she is not able to have the expanders changed for implants prior to radiation that she would likely need a latissimus flap.  PMH Significant for: Tobacco abuse, insomnia, depression, anxiety, IBS Patient is also currently on oxycodone 5 mg IR 1 to 2 tablets as needed every 6 hours.  Current everyday smoker   Past Medical History: Allergies: Allergies  Allergen Reactions  . Morphine Other (See Comments)     migranes  . Sertraline Hcl     REACTION: Worsened symptoms of IBS  . Sulfamethoxazole Rash  . Sulfonamide Derivatives Rash    Current Medications:  Current Outpatient Medications:  .  acetaminophen (TYLENOL) 325 MG tablet, Take 650 mg by mouth every 6 (six) hours as needed., Disp: , Rfl:  .  cyclobenzaprine (FLEXERIL) 10 MG tablet, Take 1 tablet (10 mg total) by mouth 3 (three) times daily as needed for muscle spasms., Disp: 30 tablet, Rfl: 0 .  dexamethasone (DECADRON) 4 MG tablet, TAKE 2 TABLETS BY MOUTH DAILY. TAKE DAILY FOR 3 DAYS AFTER CHEMO. TAKE WITH FOOD., Disp: 30 tablet, Rfl: 1 .  DULoxetine (CYMBALTA) 60 MG capsule, Take 1 capsule (60 mg total) by mouth daily., Disp: 90 capsule, Rfl: 3 .  fluconazole (DIFLUCAN) 100 MG tablet, Take 100 mg by mouth daily., Disp: , Rfl:  .  fluticasone (FLONASE) 50 MCG/ACT nasal spray, Place 1 spray into both nostrils daily., Disp: , Rfl:  .  Lactobacillus (PROBIOTIC ACIDOPHILUS PO), Take 1 capsule by mouth daily., Disp: , Rfl:  .  lidocaine (XYLOCAINE) 5 % ointment, Apply 1 application topically as needed., Disp: 35.44 g, Rfl: 1 .  loratadine (CLARITIN) 10 MG tablet, Take 10 mg by mouth daily., Disp: , Rfl:  .  Multiple Vitamins-Minerals (MULTIVITAL PO), Take 1 Dose by mouth daily., Disp: , Rfl:  .  nicotine (NICODERM CQ - DOSED IN MG/24 HOURS) 21 mg/24hr patch, PLACE 1 PATCH (21 MG  TOTAL) ONTO THE SKIN DAILY., Disp: 28 patch, Rfl: 0 .  nicotine (NICOTROL) 10 MG inhaler, FOLLOW INSTRUCTIONS ON PACKAGE AS NEEDED FOR SMOKING CESSATION, Disp: 168 each, Rfl: 0 .  nicotine polacrilex (SM NICOTINE POLACRILEX) 4 MG lozenge, take as directed on package, Disp: 72 lozenge, Rfl: 0 .  ondansetron (ZOFRAN) 8 MG tablet, Take 1 tablet (8 mg total) by mouth 2 (two) times daily as needed. Start on the third day after chemotherapy., Disp: 30 tablet, Rfl: 1 .  oxyCODONE (OXY IR/ROXICODONE) 5 MG immediate release tablet, 1-2 tablets every 6 hours as needed for pain,  Disp: 240 tablet, Rfl: 0 .  pantoprazole (PROTONIX) 20 MG tablet, Take 1 tablet (20 mg total) by mouth daily., Disp: 30 tablet, Rfl: 2 .  prochlorperazine (COMPAZINE) 10 MG tablet, Take 1 tablet (10 mg total) by mouth every 6 (six) hours as needed (Nausea or vomiting)., Disp: 30 tablet, Rfl: 1 .  urea (CARMOL) 10 % cream, Apply topically as needed. Use on hands, arm and areas of red spots bid, Disp: 71 g, Rfl: 0 .  lidocaine-prilocaine (EMLA) cream, Apply to affected area once (Patient not taking: Reported on 03/02/2021), Disp: 30 g, Rfl: 3 .  LORazepam (ATIVAN) 0.5 MG tablet, TAKE 1 TABLET BY MOUTH EVERY 6 HOURS AS NEEDED (NAUSEA OR VOMITING). (Patient not taking: Reported on 03/02/2021), Disp: 30 tablet, Rfl: 0 .  nicotine (NICODERM CQ - DOSED IN MG/24 HOURS) 21 mg/24hr patch, USE AS DIRECTED (Patient not taking: Reported on 03/02/2021), Disp: 28 patch, Rfl: 0 .  nicotine polacrilex (COMMIT) 4 MG lozenge, TAKE 1 LOZENGE (4 MG TOTAL) BY MOUTH AS NEEDED FOR SMOKING CESSATION. (Patient not taking: Reported on 03/02/2021), Disp: 100 lozenge, Rfl: 0 No current facility-administered medications for this visit.  Facility-Administered Medications Ordered in Other Visits:  .  heparin lock flush 100 unit/mL, 500 Units, Intravenous, Once, Rao, Archana C, MD  Past Medical Problems: Past Medical History:  Diagnosis Date  . Anxiety   . Breast cancer (HCC)   . Diverticulitis   . Family history of ovarian cancer   . GERD (gastroesophageal reflux disease)   . IBS (irritable bowel syndrome)   . Scoliosis     Past Surgical History: Past Surgical History:  Procedure Laterality Date  . ABDOMINAL HYSTERECTOMY     still has ovaries, no gyn cancer, hysterectomy due to endometriosis. NO cervix on exam 01/10/21  . BREAST BIOPSY Left 09/15/2020   us bx of mass, path pending, Q marker  . BREAST BIOPSY Left 09/15/2020   us bx of LN, hydromarker, path pending  . IR IMAGING GUIDED PORT INSERTION  10/01/2020     Social History: Social History   Socioeconomic History  . Marital status: Divorced    Spouse name: Not on file  . Number of children: 2  . Years of education: Not on file  . Highest education level: Not on file  Occupational History  . Occupation: Nurse    Employer: [employer]  Tobacco Use  . Smoking status: Current Every Day Smoker    Packs/day: 0.50  . Smokeless tobacco: Never Used  Vaping Use  . Vaping Use: Never used  Substance and Sexual Activity  . Alcohol use: Not Currently  . Drug use: No  . Sexual activity: Not Currently    Birth control/protection: None  Other Topics Concern  . Not on file  Social History Narrative   Patient works as an ICU nurse at [employer]. She has 2 children at home   who have special needs. She and her spouse are primary caregivers.    Social Determinants of Health   Financial Resource Strain: Not on file  Food Insecurity: Not on file  Transportation Needs: Not on file  Physical Activity: Not on file  Stress: Not on file  Social Connections: Not on file  Intimate Partner Violence: Not on file    Family History: Family History  Problem Relation Age of Onset  . Hypertension Mother   . Osteoarthritis Mother   . Diverticulitis Mother   . Heart failure Father   . Hypertension Father   . Gout Father   . Diverticulitis Brother   . Ovarian cancer Paternal Grandmother     Review of Systems: Review of Systems  Constitutional: Negative.   Respiratory: Negative.   Cardiovascular: Negative.   Gastrointestinal: Negative.   Neurological: Negative.     Physical Exam: Vital Signs BP 116/78 (BP Location: Left Arm, Patient Position: Sitting, Cuff Size: Normal)   Pulse 99   Ht 5' (1.524 m)   Wt 130 lb (59 kg)   SpO2 98%   BMI 25.39 kg/m   Physical Exam  Constitutional:      General: Not in acute distress.    Appearance: Normal appearance. Not ill-appearing.  HENT:     Head: Normocephalic and atraumatic.  Eyes:     Pupils:  Pupils are equal, round Neck:     Musculoskeletal: Normal range of motion.  Cardiovascular:     Rate and Rhythm: Normal rate    Pulses: Normal pulses.  Pulmonary:     Effort: Pulmonary effort is normal. No respiratory distress.  Abdominal:     General: Abdomen is flat. There is no distension.  Musculoskeletal: Normal range of motion.  Skin:    General: Skin is warm and dry.     Findings: No erythema or rash.  Neurological:     General: No focal deficit present.     Mental Status: Alert and oriented to person, place, and time. Mental status is at baseline.     Motor: No weakness.  Psychiatric:        Mood and Affect: Mood normal.        Behavior: Behavior normal.    Assessment/Plan: The patient is scheduled for immediate bilateral breast reconstruction after bilateral mastectomies by general surgery.  Bilateral breast reconstruction is scheduled with Dr. Marla Roe.  Risks, benefits, and alternatives of procedure discussed, questions answered and consent obtained.    Smoking Status: Current everyday smoker, half pack to 1 whole pack per day; Counseling Given?  Smoking cessation counseling provided.  We discussed increased risk of postoperative complications.  Caprini Score: 8, high; Risk Factors include: Age, current breast cancer, smoking, current Port-A-Cath in place, and length of planned surgery. Recommendation for mechanical and pharmacological prophylaxis for 1 week postoperatively. Encourage early ambulation.   Pictures obtained:_0   Post-op Rx sent to pharmacy: No prescription sent to pharmacy. Patient is currently taking oxycodone 5 mg IR 1 to 2 tablets every 6 hours.  This is prescribed by her oncologist.  She is scheduled to see oncologist prior to surgery.  She is going to discuss with oncology plan for postoperative pain control.  We discussed that I would be happy to prescribe her Percocet 5 for postoperative pain, however would like to know if oncology is okay with  this.  At the time of sending him postop pain med prescriptions we will also send in 5 days of Keflex, Zofran, Valium for muscle relaxation.  Patient was provided with the breast reconstruction and General Surgical Risk consent document and Pain Medication Agreement prior to their appointment.  They had adequate time to read through the risk consent documents and Pain Medication Agreement. We also discussed them in person together during this preop appointment. All of their questions were answered to their satisfaction.  Recommended calling if they have any further questions.  Risk consent form and Pain Medication Agreement to be scanned into patient's chart.  The risks that can be encountered with and after placement of a breast expander placement were discussed and include the following but not limited to these: bleeding, infection, delayed healing, anesthesia risks, skin sensation changes, injury to structures including nerves, blood vessels, and muscles which may be temporary or permanent, allergies to tape, suture materials and glues, blood products, topical preparations or injected agents, skin contour irregularities, skin discoloration and swelling, deep vein thrombosis, cardiac and pulmonary complications, pain, which may persist, fluid accumulation, wrinkling of the skin over the expander, changes in nipple or breast sensation, expander leakage or rupture, faulty position of the expander, persistent pain, formation of tight scar tissue around the expander (capsular contracture), possible need for revisional surgery or staged procedures.  Patient is aware of the increased risks of complications related to her smoking.  She is also aware of the risks associated with poor wound healing associated with radiation.  We discussed that inpatient is required to have radiation between expander to implant exchange we would potentially need to do a latissimus myocutaneous muscle flap.  We also discussed that we  would not be able to expand during radiation.  All of her questions were answered to her content.  All of her husband's questions were answered to his content.  Electronically signed by: Carola Rhine Abegail Kloeppel, PA-C 03/02/2021 12:43 PM

## 2021-03-02 NOTE — H&P (View-Only) (Signed)
Patient ID: Tricia Berry, female    DOB: 1970-12-13, 50 y.o.   MRN: [id number]  Chief Complaint  Patient presents with  . Pre-op Exam      ICD-10-CM   1. Malignant neoplasm of upper-outer quadrant of left breast in female, estrogen receptor positive ([place])  C50.412    Z17.0     History of Present Illness: Tricia Berry is a 50 y.o.  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  She presents for preoperative evaluation for upcoming procedure, immediate bilateral breast reconstruction placement of tissue expanders and Flex HD, scheduled for 03/17/21 with Dr. Marla Roe. Patient is undergoing bilateral mastectomies by [name] with general surgery  Patient reports a history of nausea and vomiting after anesthesia.No history of DVT/PE.  No family history of DVT/PE.  No family or personal history of bleeding or clotting disorders.  Patient is not currently taking any blood thinners.  No history of CVA/MI.   Summary of Previous Visit: Patient with recent diagnosis of invasive mammary carcinoma grade 2.  The tumor is estrogen and progesterone positive and HER2 negative.  She is planning on left mastectomy and is thinking about doing a right mastectomy as well.  She is currently on neoadjuvant chemotherapy which she will finish at the end of April.  She is working on quitting smoking.  Radiation is most likely planned.  She does not have diabetes.  She has a history of low back pain and prescription for pain meds.  Patient is aware that if she is not able to have the expanders changed for implants prior to radiation that she would likely need a latissimus flap.  PMH Significant for: Tobacco abuse, insomnia, depression, anxiety, IBS Patient is also currently on oxycodone 5 mg IR 1 to 2 tablets as needed every 6 hours.  Current everyday smoker   Past Medical History: Allergies: Allergies  Allergen Reactions  . Morphine Other (See Comments)     migranes  . Sertraline Hcl     REACTION: Worsened symptoms of IBS  . Sulfamethoxazole Rash  . Sulfonamide Derivatives Rash    Current Medications:  Current Outpatient Medications:  .  acetaminophen (TYLENOL) 325 MG tablet, Take 650 mg by mouth every 6 (six) hours as needed., Disp: , Rfl:  .  cyclobenzaprine (FLEXERIL) 10 MG tablet, Take 1 tablet (10 mg total) by mouth 3 (three) times daily as needed for muscle spasms., Disp: 30 tablet, Rfl: 0 .  dexamethasone (DECADRON) 4 MG tablet, TAKE 2 TABLETS BY MOUTH DAILY. TAKE DAILY FOR 3 DAYS AFTER CHEMO. TAKE WITH FOOD., Disp: 30 tablet, Rfl: 1 .  DULoxetine (CYMBALTA) 60 MG capsule, Take 1 capsule (60 mg total) by mouth daily., Disp: 90 capsule, Rfl: 3 .  fluconazole (DIFLUCAN) 100 MG tablet, Take 100 mg by mouth daily., Disp: , Rfl:  .  fluticasone (FLONASE) 50 MCG/ACT nasal spray, Place 1 spray into both nostrils daily., Disp: , Rfl:  .  Lactobacillus (PROBIOTIC ACIDOPHILUS PO), Take 1 capsule by mouth daily., Disp: , Rfl:  .  lidocaine (XYLOCAINE) 5 % ointment, Apply 1 application topically as needed., Disp: 35.44 g, Rfl: 1 .  loratadine (CLARITIN) 10 MG tablet, Take 10 mg by mouth daily., Disp: , Rfl:  .  Multiple Vitamins-Minerals (MULTIVITAL PO), Take 1 Dose by mouth daily., Disp: , Rfl:  .  nicotine (NICODERM CQ - DOSED IN MG/24 HOURS) 21 mg/24hr patch, PLACE 1 PATCH (21 MG  TOTAL) ONTO THE SKIN DAILY., Disp: 28 patch, Rfl: 0 .  nicotine (NICOTROL) 10 MG inhaler, FOLLOW INSTRUCTIONS ON PACKAGE AS NEEDED FOR SMOKING CESSATION, Disp: 168 each, Rfl: 0 .  nicotine polacrilex (SM NICOTINE POLACRILEX) 4 MG lozenge, take as directed on package, Disp: 72 lozenge, Rfl: 0 .  ondansetron (ZOFRAN) 8 MG tablet, Take 1 tablet (8 mg total) by mouth 2 (two) times daily as needed. Start on the third day after chemotherapy., Disp: 30 tablet, Rfl: 1 .  oxyCODONE (OXY IR/ROXICODONE) 5 MG immediate release tablet, 1-2 tablets every 6 hours as needed for pain,  Disp: 240 tablet, Rfl: 0 .  pantoprazole (PROTONIX) 20 MG tablet, Take 1 tablet (20 mg total) by mouth daily., Disp: 30 tablet, Rfl: 2 .  prochlorperazine (COMPAZINE) 10 MG tablet, Take 1 tablet (10 mg total) by mouth every 6 (six) hours as needed (Nausea or vomiting)., Disp: 30 tablet, Rfl: 1 .  urea (CARMOL) 10 % cream, Apply topically as needed. Use on hands, arm and areas of red spots bid, Disp: 71 g, Rfl: 0 .  lidocaine-prilocaine (EMLA) cream, Apply to affected area once (Patient not taking: Reported on 03/02/2021), Disp: 30 g, Rfl: 3 .  LORazepam (ATIVAN) 0.5 MG tablet, TAKE 1 TABLET BY MOUTH EVERY 6 HOURS AS NEEDED (NAUSEA OR VOMITING). (Patient not taking: Reported on 03/02/2021), Disp: 30 tablet, Rfl: 0 .  nicotine (NICODERM CQ - DOSED IN MG/24 HOURS) 21 mg/24hr patch, USE AS DIRECTED (Patient not taking: Reported on 03/02/2021), Disp: 28 patch, Rfl: 0 .  nicotine polacrilex (COMMIT) 4 MG lozenge, TAKE 1 LOZENGE (4 MG TOTAL) BY MOUTH AS NEEDED FOR SMOKING CESSATION. (Patient not taking: Reported on 03/02/2021), Disp: 100 lozenge, Rfl: 0 No current facility-administered medications for this visit.  Facility-Administered Medications Ordered in Other Visits:  .  heparin lock flush 100 unit/mL, 500 Units, Intravenous, Once, Sindy Guadeloupe, MD  Past Medical Problems: Past Medical History:  Diagnosis Date  . Anxiety   . Breast cancer ([place])   . Diverticulitis   . Family history of ovarian cancer   . GERD (gastroesophageal reflux disease)   . IBS (irritable bowel syndrome)   . Scoliosis     Past Surgical History: Past Surgical History:  Procedure Laterality Date  . ABDOMINAL HYSTERECTOMY     still has ovaries, no gyn cancer, hysterectomy due to endometriosis. NO cervix on exam 01/10/21  . BREAST BIOPSY Left 09/15/2020   [place] bx of mass, path pending, Q marker  . BREAST BIOPSY Left 09/15/2020   [place] bx of LN, hydromarker, path pending  . IR IMAGING GUIDED PORT INSERTION  10/01/2020     Social History: Social History   Socioeconomic History  . Marital status: Divorced    Spouse name: Not on file  . Number of children: 2  . Years of education: Not on file  . Highest education level: Not on file  Occupational History  . Occupation: Optician, dispensing: [employer]  Tobacco Use  . Smoking status: Current Every Day Smoker    Packs/day: 0.50  . Smokeless tobacco: Never Used  Vaping Use  . Vaping Use: Never used  Substance and Sexual Activity  . Alcohol use: Not Currently  . Drug use: No  . Sexual activity: Not Currently    Birth control/protection: None  Other Topics Concern  . Not on file  Social History Narrative   Patient works as an Warden/ranger at [employer]. She has 2 children at home  who have special needs. She and her spouse are primary caregivers.    Social Determinants of Health   Financial Resource Strain: Not on file  Food Insecurity: Not on file  Transportation Needs: Not on file  Physical Activity: Not on file  Stress: Not on file  Social Connections: Not on file  Intimate Partner Violence: Not on file    Family History: Family History  Problem Relation Age of Onset  . Hypertension Mother   . Osteoarthritis Mother   . Diverticulitis Mother   . Heart failure Father   . Hypertension Father   . Gout Father   . Diverticulitis Brother   . Ovarian cancer Paternal Grandmother     Review of Systems: Review of Systems  Constitutional: Negative.   Respiratory: Negative.   Cardiovascular: Negative.   Gastrointestinal: Negative.   Neurological: Negative.     Physical Exam: Vital Signs BP 116/78 (BP Location: Left Arm, Patient Position: Sitting, Cuff Size: Normal)   Pulse 99   Ht 5' (1.524 m)   Wt 130 lb (59 kg)   SpO2 98%   BMI 25.39 kg/m   Physical Exam  Constitutional:      General: Not in acute distress.    Appearance: Normal appearance. Not ill-appearing.  HENT:     Head: Normocephalic and atraumatic.  Eyes:     Pupils:  Pupils are equal, round Neck:     Musculoskeletal: Normal range of motion.  Cardiovascular:     Rate and Rhythm: Normal rate    Pulses: Normal pulses.  Pulmonary:     Effort: Pulmonary effort is normal. No respiratory distress.  Abdominal:     General: Abdomen is flat. There is no distension.  Musculoskeletal: Normal range of motion.  Skin:    General: Skin is warm and dry.     Findings: No erythema or rash.  Neurological:     General: No focal deficit present.     Mental Status: Alert and oriented to person, place, and time. Mental status is at baseline.     Motor: No weakness.  Psychiatric:        Mood and Affect: Mood normal.        Behavior: Behavior normal.    Assessment/Plan: The patient is scheduled for immediate bilateral breast reconstruction after bilateral mastectomies by general surgery.  Bilateral breast reconstruction is scheduled with Dr. Marla Roe.  Risks, benefits, and alternatives of procedure discussed, questions answered and consent obtained.    Smoking Status: Current everyday smoker, half pack to 1 whole pack per day; Counseling Given?  Smoking cessation counseling provided.  We discussed increased risk of postoperative complications.  Caprini Score: 8, high; Risk Factors include: Age, current breast cancer, smoking, current Port-A-Cath in place, and length of planned surgery. Recommendation for mechanical and pharmacological prophylaxis for 1 week postoperatively. Encourage early ambulation.   Pictures obtained:_0   Post-op Rx sent to pharmacy: No prescription sent to pharmacy. Patient is currently taking oxycodone 5 mg IR 1 to 2 tablets every 6 hours.  This is prescribed by her oncologist.  She is scheduled to see oncologist prior to surgery.  She is going to discuss with oncology plan for postoperative pain control.  We discussed that I would be happy to prescribe her Percocet 5 for postoperative pain, however would like to know if oncology is okay with  this.  At the time of sending him postop pain med prescriptions we will also send in 5 days of Keflex, Zofran, Valium for muscle relaxation.  Patient was provided with the breast reconstruction and General Surgical Risk consent document and Pain Medication Agreement prior to their appointment.  They had adequate time to read through the risk consent documents and Pain Medication Agreement. We also discussed them in person together during this preop appointment. All of their questions were answered to their satisfaction.  Recommended calling if they have any further questions.  Risk consent form and Pain Medication Agreement to be scanned into patient'Tricia chart.  The risks that can be encountered with and after placement of a breast expander placement were discussed and include the following but not limited to these: bleeding, infection, delayed healing, anesthesia risks, skin sensation changes, injury to structures including nerves, blood vessels, and muscles which may be temporary or permanent, allergies to tape, suture materials and glues, blood products, topical preparations or injected agents, skin contour irregularities, skin discoloration and swelling, deep vein thrombosis, cardiac and pulmonary complications, pain, which may persist, fluid accumulation, wrinkling of the skin over the expander, changes in nipple or breast sensation, expander leakage or rupture, faulty position of the expander, persistent pain, formation of tight scar tissue around the expander (capsular contracture), possible need for revisional surgery or staged procedures.  Patient is aware of the increased risks of complications related to her smoking.  She is also aware of the risks associated with poor wound healing associated with radiation.  We discussed that inpatient is required to have radiation between expander to implant exchange we would potentially need to do a latissimus myocutaneous muscle flap.  We also discussed that we  would not be able to expand during radiation.  All of her questions were answered to her content.  All of her husband'Tricia questions were answered to his content.  Electronically signed by: Carola Rhine Kedric Bumgarner, PA-C 03/02/2021 12:43 PM

## 2021-03-08 ENCOUNTER — Other Ambulatory Visit: Payer: Self-pay

## 2021-03-09 ENCOUNTER — Other Ambulatory Visit: Payer: Self-pay

## 2021-03-10 ENCOUNTER — Encounter (HOSPITAL_BASED_OUTPATIENT_CLINIC_OR_DEPARTMENT_OTHER): Payer: Self-pay | Admitting: Surgery

## 2021-03-10 ENCOUNTER — Other Ambulatory Visit: Payer: Self-pay

## 2021-03-11 ENCOUNTER — Other Ambulatory Visit: Payer: Self-pay

## 2021-03-11 ENCOUNTER — Encounter: Payer: Self-pay | Admitting: Oncology

## 2021-03-11 ENCOUNTER — Inpatient Hospital Stay (HOSPITAL_BASED_OUTPATIENT_CLINIC_OR_DEPARTMENT_OTHER): Payer: No Typology Code available for payment source | Admitting: Oncology

## 2021-03-11 ENCOUNTER — Inpatient Hospital Stay: Payer: No Typology Code available for payment source | Attending: Oncology

## 2021-03-11 VITALS — BP 135/96 | HR 90 | Temp 98.3°F | Resp 16 | Ht 60.0 in | Wt 125.2 lb

## 2021-03-11 DIAGNOSIS — C50412 Malignant neoplasm of upper-outer quadrant of left female breast: Secondary | ICD-10-CM | POA: Insufficient documentation

## 2021-03-11 DIAGNOSIS — Z170 Estrogen receptor positive status [ER+]: Secondary | ICD-10-CM | POA: Insufficient documentation

## 2021-03-11 DIAGNOSIS — Z90710 Acquired absence of both cervix and uterus: Secondary | ICD-10-CM | POA: Diagnosis not present

## 2021-03-11 DIAGNOSIS — F17210 Nicotine dependence, cigarettes, uncomplicated: Secondary | ICD-10-CM | POA: Diagnosis not present

## 2021-03-11 DIAGNOSIS — G893 Neoplasm related pain (acute) (chronic): Secondary | ICD-10-CM

## 2021-03-11 DIAGNOSIS — Z8041 Family history of malignant neoplasm of ovary: Secondary | ICD-10-CM | POA: Insufficient documentation

## 2021-03-11 DIAGNOSIS — F419 Anxiety disorder, unspecified: Secondary | ICD-10-CM

## 2021-03-11 LAB — COMPREHENSIVE METABOLIC PANEL
ALT: 22 U/L (ref 0–44)
AST: 28 U/L (ref 15–41)
Albumin: 3.8 g/dL (ref 3.5–5.0)
Alkaline Phosphatase: 64 U/L (ref 38–126)
Anion gap: 11 (ref 5–15)
BUN: 7 mg/dL (ref 6–20)
CO2: 24 mmol/L (ref 22–32)
Calcium: 9.4 mg/dL (ref 8.9–10.3)
Chloride: 102 mmol/L (ref 98–111)
Creatinine, Ser: 0.63 mg/dL (ref 0.44–1.00)
GFR, Estimated: >60 mL/min (ref >60)
Glucose, Bld: 118 mg/dL — ABNORMAL HIGH (ref 70–99)
Potassium: 3.7 mmol/L (ref 3.5–5.1)
Sodium: 137 mmol/L (ref 135–145)
Total Bilirubin: 0.6 mg/dL (ref 0.3–1.2)
Total Protein: 7.1 g/dL (ref 6.5–8.1)

## 2021-03-11 LAB — CBC WITH DIFFERENTIAL/PLATELET
Abs Immature Granulocytes: 0.03 10*3/uL (ref 0.00–0.07)
Basophils Absolute: 0.1 10*3/uL (ref 0.0–0.1)
Basophils Relative: 1 %
Eosinophils Absolute: 0.2 10*3/uL (ref 0.0–0.5)
Eosinophils Relative: 3 %
HCT: 40.2 % (ref 36.0–46.0)
Hemoglobin: 13.4 g/dL (ref 12.0–15.0)
Immature Granulocytes: 0 %
Lymphocytes Relative: 35 %
Lymphs Abs: 2.8 10*3/uL (ref 0.7–4.0)
MCH: 32.1 pg (ref 26.0–34.0)
MCHC: 33.3 g/dL (ref 30.0–36.0)
MCV: 96.4 fL (ref 80.0–100.0)
Monocytes Absolute: 0.9 10*3/uL (ref 0.1–1.0)
Monocytes Relative: 11 %
Neutro Abs: 4 10*3/uL (ref 1.7–7.7)
Neutrophils Relative %: 50 %
Platelets: 325 10*3/uL (ref 150–400)
RBC: 4.17 MIL/uL (ref 3.87–5.11)
RDW: 14.6 % (ref 11.5–15.5)
WBC: 8 10*3/uL (ref 4.0–10.5)
nRBC: 0 % (ref 0.0–0.2)

## 2021-03-11 NOTE — Progress Notes (Signed)
Hematology/Oncology Consult note [name]  Telephone:([phone] Fax:([phone]  Patient Care Team: Burnard Hawthorne, FNP as PCP - General (Family Medicine) Rico Junker, RN as Oncology Nurse Navigator   Name of the patient: Tricia Berry  [id number]  24-Jun-1971   Date of visit: 03/11/21  Diagnosis-  invasive mammary carcinoma of the left breast anatomical stage III BC T4N 1M0 ER/PR positive HER-2 negative  Chief complaint/ Reason for visit-routine follow-up of breast cancer post neoadjuvant chemotherapy  Heme/Onc history: [name].She has noticed a left breast lump for about a year but did not seek medical attention as she was out of medical insurance and did not have a PCP. She then noticed that her lump is gradually getting larger with an area of ulceration on the skin and underwent a diagnostic bilateral mammogram on 09/08/2020 which showed hypoechoic interconnected masses in the left breast from 10:00 to 2 o'clock position spanning at least an area of 4.8 cm. Ultrasound also showed 5 abnormal lymph nodes in the axilla with cortical thickening. Both the mass and the lymph nodes were biopsied and was consistent with invasive mammary carcinoma grade 2. Tumor was ER +91- 100%, PR +11 to 20% and HER-2 negative.Ki-67 15%.Patient will be meeting [name] from [name] surgery to discuss surgical management. She is here for medical oncology recommendations. In terms of her general health patient is otherwise doing well and does not have significant comorbidities. She does endorse significant pain in the area of her left breast. Tylenol and Motrin has not been helping her with this pain.She lives with her husband and 2 children who have special needs. No prior history of abnormal breast mammograms or breast biopsies.  PET CT scan showed hypermetabolism in the area of the left breast but no evidence of hypermetabolism in the left  axillaOr evidence of distant metastatic disease.  Neoadjuvant dose dense ACT chemotherapy started on 10/08/2020.Interim ultrasound after 4 cycles of dose dense AC chemotherapy showed overall decrease in volume of the tumor. Nodularity previously seen on ultrasound was also decreased in size.  Patient completed neoadjuvant AC Taxol chemotherapy on 02/11/2021.  Plan to undergo bilateral mastectomy and axillary lymph node dissection on 03/17/2021.  Interval history-patient still has ongoing symptoms of anxiety for which she takes as needed Ativan.  Reports left chest wall pain for which she is on as needed oxycodone.  Reports neuropathy in her bilateral feet is getting worse.  She has been on Cymbalta for the last 2 to 3 months and is currently at 60 mg.  ECOG PS- 1 Pain scale- 3 Opioid associated constipation- no  Review of systems- Review of Systems  Constitutional: Positive for malaise/fatigue. Negative for chills, fever and weight loss.  HENT: Negative for congestion, ear discharge and nosebleeds.   Eyes: Negative for blurred vision.  Respiratory: Negative for cough, hemoptysis, sputum production, shortness of breath and wheezing.   Cardiovascular: Negative for chest pain, palpitations, orthopnea and claudication.  Gastrointestinal: Negative for abdominal pain, blood in stool, constipation, diarrhea, heartburn, melena, nausea and vomiting.  Genitourinary: Negative for dysuria, flank pain, frequency, hematuria and urgency.  Musculoskeletal: Negative for back pain, joint pain and myalgias.  Skin: Negative for rash.  Neurological: Positive for sensory change (Peripheral neuropathy). Negative for dizziness, tingling, focal weakness, seizures, weakness and headaches.  Endo/Heme/Allergies: Does not bruise/bleed easily.  Psychiatric/Behavioral: Negative for depression and suicidal ideas. The patient does not have insomnia.      Allergies  Allergen Reactions  .  Morphine Nausea And Vomiting and  Other (See Comments)    migranes  . Sertraline Hcl     REACTION: Worsened symptoms of IBS  . Sulfamethoxazole Rash  . Sulfonamide Derivatives Rash     Past Medical History:  Diagnosis Date  . Anxiety   . Breast cancer ([name])   . Diverticulitis   . Family history of ovarian cancer   . GERD (gastroesophageal reflux disease)   . IBS (irritable bowel syndrome)   . PONV (postoperative nausea and vomiting)    severe migraine and vomiting post anesthesia  . Scoliosis      Past Surgical History:  Procedure Laterality Date  . ABDOMINAL HYSTERECTOMY     still has ovaries, no gyn cancer, hysterectomy due to endometriosis. NO cervix on exam 01/10/21  . BREAST BIOPSY Left 09/15/2020   [place] bx of mass, path pending, Q marker  . BREAST BIOPSY Left 09/15/2020   [place] bx of LN, hydromarker, path pending  . IR IMAGING GUIDED PORT INSERTION  10/01/2020    Social History   Socioeconomic History  . Marital status: Divorced    Spouse name: Not on file  . Number of children: 2  . Years of education: Not on file  . Highest education level: Not on file  Occupational History  . Occupation: Optician, dispensing: [name]  Tobacco Use  . Smoking status: Current Every Day Smoker    Packs/day: 0.50  . Smokeless tobacco: Never Used  Vaping Use  . Vaping Use: Never used  Substance and Sexual Activity  . Alcohol use: Not Currently  . Drug use: No  . Sexual activity: Not Currently    Birth control/protection: None  Other Topics Concern  . 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 She and her spouse are primary caregivers.    Social Determinants of Health   Financial Resource Strain: Not on file  Food Insecurity: Not on file  Transportation Needs: Not on file  Physical Activity: Not on file  Stress: Not on file  Social Connections: Not on file  Intimate Partner Violence: Not on file    Family History   Problem Relation Age of Onset  . Hypertension Mother   . Osteoarthritis Mother   . Diverticulitis Mother   . Heart failure Father   . Hypertension Father   . Gout Father   . Diverticulitis Brother   . Ovarian cancer Paternal Grandmother      Current Outpatient Medications:  .  acetaminophen (TYLENOL) 325 MG tablet, Take 650 mg by mouth every 6 (six) hours as needed., Disp: , Rfl:  .  DULoxetine (CYMBALTA) 60 MG capsule, Take 1 capsule (60 mg total) by mouth daily., Disp: 90 capsule, Rfl: 3 .  Lactobacillus (PROBIOTIC ACIDOPHILUS PO), Take 1 capsule by mouth daily., Disp: , Rfl:  .  lidocaine-prilocaine (EMLA) cream, Apply to affected area once, Disp: 30 g, Rfl: 3 .  LORazepam (ATIVAN) 0.5 MG tablet, TAKE 1 TABLET BY MOUTH EVERY 6 HOURS AS NEEDED (NAUSEA OR VOMITING)., Disp: 30 tablet, Rfl: 0 .  nicotine (NICODERM CQ - DOSED IN MG/24 HOURS) 21 mg/24hr patch, PLACE 1 PATCH (21 MG TOTAL) ONTO THE SKIN DAILY., Disp: 28 patch, Rfl: 0 .  nicotine polacrilex (SM NICOTINE POLACRILEX) 4 MG lozenge, take as directed on package, Disp: 72 lozenge, Rfl: 0 .  ondansetron (ZOFRAN) 8 MG tablet, Take  1 tablet (8 mg total) by mouth 2 (two) times daily as needed. Start on the third day after chemotherapy., Disp: 30 tablet, Rfl: 1 .  oxyCODONE (OXY IR/ROXICODONE) 5 MG immediate release tablet, 1-2 tablets every 6 hours as needed for pain, Disp: 240 tablet, Rfl: 0 .  urea (CARMOL) 10 % cream, Apply topically as needed. Use on hands, arm and areas of red spots bid, Disp: 71 g, Rfl: 0 .  cyclobenzaprine (FLEXERIL) 10 MG tablet, Take 1 tablet (10 mg total) by mouth 3 (three) times daily as needed for muscle spasms. (Patient not taking: Reported on 03/11/2021), Disp: 30 tablet, Rfl: 0 .  Multiple Vitamins-Minerals (MULTIVITAL PO), Take 1 Dose by mouth daily., Disp: , Rfl:  .  nicotine (NICOTROL) 10 MG inhaler, FOLLOW INSTRUCTIONS ON PACKAGE AS NEEDED FOR SMOKING CESSATION (Patient not taking: Reported on  03/11/2021), Disp: 168 each, Rfl: 0 .  pantoprazole (PROTONIX) 20 MG tablet, Take 1 tablet (20 mg total) by mouth daily. (Patient not taking: Reported on 03/11/2021), Disp: 30 tablet, Rfl: 2 .  prochlorperazine (COMPAZINE) 10 MG tablet, Take 1 tablet (10 mg total) by mouth every 6 (six) hours as needed (Nausea or vomiting). (Patient not taking: Reported on 03/11/2021), Disp: 30 tablet, Rfl: 1 No current facility-administered medications for this visit.  Facility-Administered Medications Ordered in Other Visits:  .  heparin lock flush 100 unit/mL, 500 Units, Intravenous, Once, Sindy Guadeloupe, MD  Physical exam:  Vitals:   03/11/21 1103  BP: (!) 135/96  Pulse: 90  Resp: 16  Temp: 98.3 F (36.8 C)  TempSrc: Oral  Weight: 125 lb 3.2 oz (56.8 kg)  Height: 5' (1.524 m)   Physical Exam Constitutional:      General: She is not in acute distress. Cardiovascular:     Rate and Rhythm: Normal rate.     Heart sounds: Normal heart sounds.  Pulmonary:     Effort: Pulmonary effort is normal.  Skin:    General: Skin is warm and dry.  Neurological:     Mental Status: She is alert and oriented to person, place, and time.      CMP Latest Ref Rng & Units 03/11/2021  Glucose 70 - 99 mg/dL 118(H)  BUN 6 - 20 mg/dL 7  Creatinine 0.44 - 1.00 mg/dL 0.63  Sodium 135 - 145 mmol/L 137  Potassium 3.5 - 5.1 mmol/L 3.7  Chloride 98 - 111 mmol/L 102  CO2 22 - 32 mmol/L 24  Calcium 8.9 - 10.3 mg/dL 9.4  Total Protein 6.5 - 8.1 g/dL 7.1  Total Bilirubin 0.3 - 1.2 mg/dL 0.6  Alkaline Phos 38 - 126 U/L 64  AST 15 - 41 U/L 28  ALT 0 - 44 U/L 22   CBC Latest Ref Rng & Units 03/11/2021  WBC 4.0 - 10.5 K/uL 8.0  Hemoglobin 12.0 - 15.0 g/dL 13.4  Hematocrit 36.0 - 46.0 % 40.2  Platelets 150 - 400 K/uL 325      Assessment and plan- Patient is a 50 y.o. female withinvasive mammary carcinoma of the left breast anatomical stage III Bc4N 1M0 ER/PR positive and HER-2 negative.  She is s/p neoadjuvant AC  Taxol chemotherapy  Patient has completed neoadjuvant AC Taxol chemotherapy.  She will be proceeding with bilateral mastectomy and axillary lymph node dissection with reconstruction next week.  I will see her back 3 to 4 weeks from now to discuss final pathology results and further management.  She would be a potential candidate for adjuvant  abemaciclib along with AI.  Neoplasm related pain: I have renewed her oxycodone prescription but explained to the patient that she will need to come off opioids 1 month after surgery and I would not be refilling her opioids at that time.  Anxiety: I will refill her Ativan at this time but she will need to slowly wean herself off 1 to 2 months post surgery    Visit Diagnosis 1. Malignant neoplasm of upper-outer quadrant of left breast in female, estrogen receptor positive ([place])   2. Anxiety   3. Neoplasm related pain      Dr. Randa Evens, MD, MPH [facility] [phone] 03/11/2021 1:52 PM

## 2021-03-11 NOTE — Progress Notes (Signed)
Pt hurting under left axilla and breast, also neuopathy of fingers, feet and legs. She is nervous about the upcoming surgery.

## 2021-03-14 ENCOUNTER — Encounter: Payer: Self-pay | Admitting: Family

## 2021-03-14 ENCOUNTER — Other Ambulatory Visit (HOSPITAL_COMMUNITY): Payer: No Typology Code available for payment source

## 2021-03-14 ENCOUNTER — Ambulatory Visit (INDEPENDENT_AMBULATORY_CARE_PROVIDER_SITE_OTHER): Payer: No Typology Code available for payment source | Admitting: Family

## 2021-03-14 ENCOUNTER — Other Ambulatory Visit: Payer: Self-pay

## 2021-03-14 ENCOUNTER — Telehealth: Payer: Self-pay

## 2021-03-14 VITALS — BP 108/72 | HR 84 | Temp 97.9°F | Ht 60.0 in | Wt 124.0 lb

## 2021-03-14 DIAGNOSIS — G620 Drug-induced polyneuropathy: Secondary | ICD-10-CM | POA: Diagnosis not present

## 2021-03-14 DIAGNOSIS — T451X5A Adverse effect of antineoplastic and immunosuppressive drugs, initial encounter: Secondary | ICD-10-CM

## 2021-03-14 DIAGNOSIS — F411 Generalized anxiety disorder: Secondary | ICD-10-CM

## 2021-03-14 LAB — B12 AND FOLATE PANEL
Folate: >24.4 ng/mL (ref >5.9)
Vitamin B-12: 371 pg/mL (ref 211–911)

## 2021-03-14 MED ORDER — TRAZODONE HCL 50 MG PO TABS: 25.0000 mg | ORAL_TABLET | Freq: Every evening | ORAL | 3 refills | Status: DC | PRN

## 2021-03-14 NOTE — Progress Notes (Signed)
Subjective:    Patient ID: [name], female    DOB: Jul 01, 1971, 50 y.o.   MRN: [id number]  CC: ANIYHA TATE is a 50 y.o. female who presents today for follow up.   HPI: Vaginal dryness and vaginal itching has resolved since starting chemo.  She feels well today aside from nervous about double mastectomy this week.    She is worried about when Dr Janese Banks discontinues ativan. Takes ativan 0.5mg  prn - she may take twice per week to help fall asleep.  Tried trazodone years ago.    Compliant with cymbalta 60mg  qd   She continues to have bilateral upper and lower distal neuropathy with onset when she started chemo. She has discussed gabapentin, lyrica with dr Janese Banks and at this time declines to take a medication.        Following with Dr Janese Banks left breast cancer,planning bilateral mastectomy and axillary lymph node dissection with reconstruction in 4 days.  Wean off ativan, opioids after surgery  HISTORY:  Past Medical History:  Diagnosis Date  . Anxiety   . Breast cancer ([place])   . Diverticulitis   . Family history of ovarian cancer   . GERD (gastroesophageal reflux disease)   . IBS (irritable bowel syndrome)   . PONV (postoperative nausea and vomiting)    severe migraine and vomiting post anesthesia  . Scoliosis    Past Surgical History:  Procedure Laterality Date  . ABDOMINAL HYSTERECTOMY     still has ovaries, no gyn cancer, hysterectomy due to endometriosis. NO cervix on exam 01/10/21  . BREAST BIOPSY Left 09/15/2020   [place] bx of mass, path pending, Q marker  . BREAST BIOPSY Left 09/15/2020   [place] bx of LN, hydromarker, path pending  . IR IMAGING GUIDED PORT INSERTION  10/01/2020   Family History  Problem Relation Age of Onset  . Hypertension Mother   . Osteoarthritis Mother   . Diverticulitis Mother   . Heart failure Father   . Hypertension Father   . Gout Father   . Diverticulitis Brother   . Ovarian cancer Paternal Grandmother     Allergies:  Morphine, Sertraline hcl, Sulfamethoxazole, and Sulfonamide derivatives Current Outpatient Medications on File Prior to Visit  Medication Sig Dispense Refill  . acetaminophen (TYLENOL) 325 MG tablet Take 650 mg by mouth every 6 (six) hours as needed.    . DULoxetine (CYMBALTA) 60 MG capsule Take 1 capsule (60 mg total) by mouth daily. 90 capsule 3  . Lactobacillus (PROBIOTIC ACIDOPHILUS PO) Take 1 capsule by mouth daily.    Marland Kitchen LORazepam (ATIVAN) 0.5 MG tablet TAKE 1 TABLET BY MOUTH EVERY 6 HOURS AS NEEDED (NAUSEA OR VOMITING). 30 tablet 0  . Multiple Vitamins-Minerals (MULTIVITAL PO) Take 1 Dose by mouth daily.    . nicotine (NICODERM CQ - DOSED IN MG/24 HOURS) 21 mg/24hr patch PLACE 1 PATCH (21 MG TOTAL) ONTO THE SKIN DAILY. 28 patch 0  . nicotine (NICOTROL) 10 MG inhaler FOLLOW INSTRUCTIONS ON PACKAGE AS NEEDED FOR SMOKING CESSATION 168 each 0  . nicotine polacrilex (SM NICOTINE POLACRILEX) 4 MG lozenge take as directed on package 72 lozenge 0  . ondansetron (ZOFRAN) 8 MG tablet Take 1 tablet (8 mg total) by mouth 2 (two) times daily as needed. Start on the third day after chemotherapy. 30 tablet 1  . oxyCODONE (OXY IR/ROXICODONE) 5 MG immediate release tablet 1-2 tablets every 6 hours as needed for pain 240 tablet 0  . urea (CARMOL) 10 % cream  Apply topically as needed. Use on hands, arm and areas of red spots bid 71 g 0  . cyclobenzaprine (FLEXERIL) 10 MG tablet Take 1 tablet (10 mg total) by mouth 3 (three) times daily as needed for muscle spasms. (Patient not taking: No sig reported) 30 tablet 0  . lidocaine-prilocaine (EMLA) cream Apply to affected area once (Patient not taking: Reported on 03/14/2021) 30 g 3  . pantoprazole (PROTONIX) 20 MG tablet Take 1 tablet (20 mg total) by mouth daily. (Patient not taking: No sig reported) 30 tablet 2  . prochlorperazine (COMPAZINE) 10 MG tablet Take 1 tablet (10 mg total) by mouth every 6 (six) hours as needed (Nausea or vomiting). (Patient not taking:  No sig reported) 30 tablet 1   Current Facility-Administered Medications on File Prior to Visit  Medication Dose Route Frequency Provider Last Rate Last Admin  . heparin lock flush 100 unit/mL  500 Units Intravenous Once Sindy Guadeloupe, MD        Social History   Tobacco Use  . Smoking status: Current Every Day Smoker    Packs/day: 0.50  . Smokeless tobacco: Never Used  Vaping Use  . Vaping Use: Never used  Substance Use Topics  . Alcohol use: Not Currently  . Drug use: No    Review of Systems  Constitutional: Negative for chills and fever.  Respiratory: Negative for cough.   Cardiovascular: Negative for chest pain and palpitations.  Gastrointestinal: Negative for nausea and vomiting.  Neurological: Positive for numbness.  Psychiatric/Behavioral: Positive for sleep disturbance. The patient is nervous/anxious.       Objective:    BP 108/72 (BP Location: Left Arm, Patient Position: Sitting, Cuff Size: Normal)   Pulse 84   Temp 97.9 F (36.6 C) (Oral)   Ht 5' (1.524 m)   Wt 124 lb (56.2 kg)   SpO2 98%   BMI 24.22 kg/m  BP Readings from Last 3 Encounters:  03/14/21 108/72  03/11/21 (!) 135/96  03/02/21 116/78   Wt Readings from Last 3 Encounters:  03/14/21 124 lb (56.2 kg)  03/11/21 125 lb 3.2 oz (56.8 kg)  03/02/21 130 lb (59 kg)    Physical Exam Vitals reviewed.  Constitutional:      Appearance: She is well-developed.  Eyes:     Conjunctiva/sclera: Conjunctivae normal.  Cardiovascular:     Rate and Rhythm: Normal rate and regular rhythm.     Pulses: Normal pulses.     Heart sounds: Normal heart sounds.  Pulmonary:     Effort: Pulmonary effort is normal.     Breath sounds: Normal breath sounds. No wheezing, rhonchi or rales.  Skin:    General: Skin is warm and dry.  Neurological:     Mental Status: She is alert.  Psychiatric:        Speech: Speech normal.        Behavior: Behavior normal.        Thought Content: Thought content normal.         Assessment & Plan:   Problem List Items Addressed This Visit      Nervous and Auditory   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HCC)    TSH normalized. Pending b12 studies. Discussed pharmacotherapy for symptom; she declines at this time and we will revisit at follow up.       Relevant Medications   traZODone (DESYREL) 50 MG tablet   Other Relevant Orders   B12 and Folate Panel     Other   Anxiety state -  Primary    Stable. Controlled. Discussed long term side effects related to cognition, memory with BZDs and discussed plan to wean off of ativan. She is using sparingly. We will trial trazodone 25mg  prn to see if helpful for sleep. We also discussed atarax. She will let me know how she is doing.      Relevant Medications   traZODone (DESYREL) 50 MG tablet       I am having Melina G. Aponte "[name]" start on traZODone. I am also having her maintain her cyclobenzaprine, Multiple Vitamins-Minerals (MULTIVITAL PO), Lactobacillus (PROBIOTIC ACIDOPHILUS PO), lidocaine-prilocaine, prochlorperazine, acetaminophen, pantoprazole, urea, nicotine, LORazepam, ondansetron, nicotine, nicotine polacrilex, DULoxetine, and oxyCODONE.   Meds ordered this encounter  Medications  . traZODone (DESYREL) 50 MG tablet    Sig: Take 0.5-1 tablets (25-50 mg total) by mouth at bedtime as needed for sleep.    Dispense:  30 tablet    Refill:  3    Order Specific Question:   Supervising Provider    Answer:   Crecencio Mc [[id number]]    Return precautions given.   Risks, benefits, and alternatives of the medications and treatment plan prescribed today were discussed, and patient expressed understanding.   Education regarding symptom management and diagnosis given to patient on AVS.  Continue to follow with Burnard Hawthorne, FNP for routine health maintenance.   Alden Benjamin Krotz and I agreed with plan.   Mable Paris, FNP

## 2021-03-14 NOTE — Assessment & Plan Note (Signed)
TSH normalized. Pending b12 studies. Discussed pharmacotherapy for symptom; she declines at this time and we will revisit at follow up.

## 2021-03-14 NOTE — Telephone Encounter (Signed)
Left message for patient regarding CARE program.

## 2021-03-14 NOTE — Patient Instructions (Addendum)
You will not have to wean ativan if you are taking more than 3 or 4 times per week  Please try trazodone as needed for insomnia  Thinking of you as you approach surgery

## 2021-03-14 NOTE — Assessment & Plan Note (Signed)
Stable. Controlled. Discussed long term side effects related to cognition, memory with BZDs and discussed plan to wean off of ativan. She is using sparingly. We will trial trazodone 25mg  prn to see if helpful for sleep. We also discussed atarax. She will let me know how she is doing.

## 2021-03-15 ENCOUNTER — Other Ambulatory Visit: Payer: Self-pay | Admitting: *Deleted

## 2021-03-15 ENCOUNTER — Encounter (HOSPITAL_BASED_OUTPATIENT_CLINIC_OR_DEPARTMENT_OTHER)
Admission: RE | Admit: 2021-03-15 | Discharge: 2021-03-15 | Disposition: A | Discharge status: Home / Self Care | Payer: No Typology Code available for payment source | Source: Ambulatory Visit | Attending: Surgery | Admitting: Surgery

## 2021-03-15 ENCOUNTER — Ambulatory Visit: Payer: No Typology Code available for payment source | Attending: Surgery | Admitting: Physical Therapy

## 2021-03-15 ENCOUNTER — Other Ambulatory Visit: Payer: Self-pay

## 2021-03-15 ENCOUNTER — Encounter: Payer: Self-pay | Admitting: Physical Therapy

## 2021-03-15 ENCOUNTER — Other Ambulatory Visit: Payer: Self-pay | Admitting: Oncology

## 2021-03-15 DIAGNOSIS — C50912 Malignant neoplasm of unspecified site of left female breast: Secondary | ICD-10-CM | POA: Insufficient documentation

## 2021-03-15 DIAGNOSIS — C50412 Malignant neoplasm of upper-outer quadrant of left female breast: Secondary | ICD-10-CM

## 2021-03-15 DIAGNOSIS — R293 Abnormal posture: Secondary | ICD-10-CM | POA: Insufficient documentation

## 2021-03-15 DIAGNOSIS — Z170 Estrogen receptor positive status [ER+]: Secondary | ICD-10-CM

## 2021-03-15 DIAGNOSIS — Z01812 Encounter for preprocedural laboratory examination: Secondary | ICD-10-CM | POA: Insufficient documentation

## 2021-03-15 LAB — COMPREHENSIVE METABOLIC PANEL
ALT: 22 U/L (ref 0–44)
AST: 24 U/L (ref 15–41)
Albumin: 3.6 g/dL (ref 3.5–5.0)
Alkaline Phosphatase: 59 U/L (ref 38–126)
Anion gap: 5 (ref 5–15)
BUN: 5 mg/dL — ABNORMAL LOW (ref 6–20)
CO2: 28 mmol/L (ref 22–32)
Calcium: 9.7 mg/dL (ref 8.9–10.3)
Chloride: 104 mmol/L (ref 98–111)
Creatinine, Ser: 0.69 mg/dL (ref 0.44–1.00)
GFR, Estimated: >60 mL/min (ref >60)
Glucose, Bld: 141 mg/dL — ABNORMAL HIGH (ref 70–99)
Potassium: 4.9 mmol/L (ref 3.5–5.1)
Sodium: 137 mmol/L (ref 135–145)
Total Bilirubin: 0.6 mg/dL (ref 0.3–1.2)
Total Protein: 6.5 g/dL (ref 6.5–8.1)

## 2021-03-15 LAB — CBC WITH DIFFERENTIAL/PLATELET
Abs Immature Granulocytes: 0.02 K/uL (ref 0.00–0.07)
Basophils Absolute: 0.1 K/uL (ref 0.0–0.1)
Basophils Relative: 1 %
Eosinophils Absolute: 0.1 K/uL (ref 0.0–0.5)
Eosinophils Relative: 2 %
HCT: 41.5 % (ref 36.0–46.0)
Hemoglobin: 13.5 g/dL (ref 12.0–15.0)
Immature Granulocytes: 0 %
Lymphocytes Relative: 36 %
Lymphs Abs: 2.7 K/uL (ref 0.7–4.0)
MCH: 31.5 pg (ref 26.0–34.0)
MCHC: 32.5 g/dL (ref 30.0–36.0)
MCV: 96.7 fL (ref 80.0–100.0)
Monocytes Absolute: 0.6 K/uL (ref 0.1–1.0)
Monocytes Relative: 9 %
Neutro Abs: 4 K/uL (ref 1.7–7.7)
Neutrophils Relative %: 52 %
Platelets: 307 K/uL (ref 150–400)
RBC: 4.29 MIL/uL (ref 3.87–5.11)
RDW: 14.5 % (ref 11.5–15.5)
WBC: 7.6 K/uL (ref 4.0–10.5)
nRBC: 0 % (ref 0.0–0.2)

## 2021-03-15 MED FILL — Nicotine TD Patch 24HR 21 MG/24HR: TRANSDERMAL | 28 days supply | Qty: 28 | Fill #0 | Status: AC

## 2021-03-15 NOTE — Progress Notes (Signed)

## 2021-03-15 NOTE — Therapy (Signed)
[place], [place], [address] Phone: [phone]   Fax:  [phone]  Physical Therapy Evaluation  Patient Details  Name: Tricia Berry MRN: [id number] Date of Birth: 05/30/1971 Referring Provider (PT): Cornett   Encounter Date: 03/15/2021   PT End of Session - 03/15/21 1354    Visit Number 1    Number of Visits 2    Date for PT Re-Evaluation 04/12/21    PT Start Time 1302    PT Stop Time 1354    PT Time Calculation (min) 52 min    Activity Tolerance Patient tolerated treatment well    Behavior During Therapy [facility] for tasks assessed/performed           Past Medical History:  Diagnosis Date  . Anxiety   . Breast cancer ([place])   . Diverticulitis   . Family history of ovarian cancer   . GERD (gastroesophageal reflux disease)   . IBS (irritable bowel syndrome)   . PONV (postoperative nausea and vomiting)    severe migraine and vomiting post anesthesia  . Scoliosis     Past Surgical History:  Procedure Laterality Date  . ABDOMINAL HYSTERECTOMY     still has ovaries, no gyn cancer, hysterectomy due to endometriosis. NO cervix on exam 01/10/21  . BREAST BIOPSY Left 09/15/2020   [place] bx of mass, path pending, Q marker  . BREAST BIOPSY Left 09/15/2020   [place] bx of LN, hydromarker, path pending  . IR IMAGING GUIDED PORT INSERTION  10/01/2020    There were no vitals filed for this visit.    Subjective Assessment - 03/15/21 1307    Subjective Under my left armpit hurts when I raise my arm and it burns. I am very anxious about my surgery.    Pertinent History L breast cancer ER+/PR+ HER2-, stage III, will undergo a bilateral mastectomy and ALND on 03/17/21, neuropathy of hands, lower legs and feet    Patient Stated Goals to gain info from providers    Currently in Pain? Yes    Pain Score 7     Pain Location --   legs, feet and hands   Pain Orientation Right;Left    Pain Descriptors /  Indicators Numbness    Pain Type Neuropathic pain    Pain Onset More than a month ago    Pain Frequency Constant    Aggravating Factors  touching backs of legs, reaching in hard purse hurts hand, gripping stuff    Pain Relieving Factors oxycodone    Effect of Pain on Daily Activities hard to do due to ADLs because hard to grip things, hard to stand for long periods              [facility] PT Assessment - 03/15/21 0001      Assessment   Medical Diagnosis L breast cancer    Referring Provider (PT) Cornett    Onset Date/Surgical Date 03/17/21   diagnosed in Nov 21   Hand Dominance Right    Prior Therapy none      Precautions   Precautions Other (comment)    Precaution Comments active cancer      Restrictions   Weight Bearing Restrictions No      Balance Screen   Has the patient fallen in the past 6 months No    Has the patient had a decrease in activity level because of a fear of falling?  No    Is the patient reluctant to  leave their home because of a fear of falling?  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    Leisure yoga, treadmill, exercise equipment, stretches 2x/wk for 1 hr, walks in back yard the rest of the days of the week      Cognition   Overall Cognitive Status Within Functional Limits for tasks assessed      Functional Tests   Functional tests Sit to Stand      Sit to Stand   Comments 30 sec sit to stand: 5 reps pt reports she is weak and has increased back pain      Posture/Postural Control   Posture/Postural Control Postural limitations    Postural Limitations Rounded Shoulders;Forward head      ROM / Strength   AROM / PROM / Strength AROM       AROM   AROM Assessment Site Shoulder    Right Shoulder Extension 49 Degrees    Right Shoulder Flexion 175 Degrees    Right Shoulder ABduction 180 Degrees    Right Shoulder Internal Rotation 59 Degrees    Right Shoulder External Rotation 103 Degrees    Left Shoulder Extension 36 Degrees    Left Shoulder Flexion 160 Degrees    Left Shoulder ABduction 170 Degrees    Left Shoulder Internal Rotation 57 Degrees    Left Shoulder External Rotation 100 Degrees             LYMPHEDEMA/ONCOLOGY QUESTIONNAIRE - 03/15/21 0001      Lymphedema Assessments   Lymphedema Assessments Upper extremities      Right Upper Extremity Lymphedema   15 cm Proximal to Olecranon Process 26 cm    Olecranon Process 23 cm    15 cm Proximal to Ulnar Styloid Process 22 cm    Just Proximal to Ulnar Styloid Process 15.1 cm    Across Hand at [employer] 17.6 cm    At [place] of 2nd Digit 5.8 cm      Left Upper Extremity Lymphedema   15 cm Proximal to Olecranon Process 26 cm    Olecranon Process 23 cm    15 cm Proximal to Ulnar Styloid Process 21.8 cm    Just Proximal to Ulnar Styloid Process 15.1 cm    Across Hand at [employer] 18.7 cm    At [place] of 2nd Digit 6 cm           L-DEX FLOWSHEETS - 03/15/21 1300      L-DEX LYMPHEDEMA SCREENING   Measurement Type Unilateral    L-DEX MEASUREMENT EXTREMITY Upper Extremity    POSITION  Standing    DOMINANT SIDE Right    At Risk Side Left    BASELINE SCORE (UNILATERAL) 0.6          The patient was assessed using the L-Dex machine today to produce a lymphedema index baseline score. The patient will be reassessed on a regular basis (typically every 3 months) to obtain new L-Dex scores. If the score is > 6.5 points away from his/her baseline score indicating onset of subclinical lymphedema, it will be recommended to  wear a compression garment for 4 weeks, 12 hours per day and then be reassessed. If the score continues to be > 6.5 points from baseline  at reassessment, we will initiate lymphedema treatment. Assessing in this manner has a 95% rate of preventing clinically significant lymphedema.         Objective measurements completed on examination: See above findings.               PT Education - 03/15/21 1356    Education Details ABC class, post op shoulder ROM exercises, anatomy and physiology of lymphatic system, signs and symptoms of lymphedema, SOZO, lymphedema risk reduction practices    Person(s) Educated Patient    Methods Explanation;Handout    Comprehension Verbalized understanding               PT Long Term Goals - 03/15/21 1358      PT LONG TERM GOAL #1   Title Pt will return to baseline shoulder ROM measurements and not demonstrate any signs or symptoms of lymphedema.    Time 4    Period Weeks    Status New    Target Date 04/12/21           Breast Clinic Goals - 03/15/21 1358      Patient will be able to verbalize understanding of pertinent lymphedema risk reduction practices relevant to her diagnosis specifically related to skin care.   Time 1    Period Days    Status Achieved      Patient will be able to return demonstrate and/or verbalize understanding of the post-op home exercise program related to regaining shoulder range of motion.   Time 1    Period Days    Status Achieved      Patient will be able to verbalize understanding of the importance of attending the postoperative After Breast Cancer Class for further lymphedema risk reduction education and therapeutic exercise.   Time 1    Period Days    Status Achieved                 Plan - 03/15/21 1354    Clinical Impression Statement Pt presents to PT with recently diagnosed left breast cancer that is ER/PR+, HER2-. She is scheduled to undergo a bilateral mastectomy and SLNB on 03/17/21. Pt has completed neoadjuvant chemo and will require radiation. Pt has full R shoulder ROM but is silghtly limited on the L due to pain  since her lymph node biopsy. Pt was only able to complete 5 sit to stands which is poor for her age. She would benefit from skilled PT services to address this after her surgery. Pt was instructed in post op exercises and educated that if she has a mastectomy then not to begin exercises until a week after the last drain is removed. Baseline ROM and SOZO measurements taken today. She will benefit from a post op PT reassessment to determine needs and in every 3 months for additional L dex screening to detect subclinical lymphedema.    Stability/Clinical Decision Making Stable/Uncomplicated    Clinical Decision Making Low    Rehab Potential Good    PT Frequency --   eval and 1 f/u visit   PT Duration 4 weeks    PT Treatment/Interventions ADLs/Self Care Home Management;Therapeutic exercise;Patient/family education    PT Next Visit Plan reassess baselines    PT Home Exercise Plan post op breast exercises    Consulted and Agree with Plan of Care Patient  Patient will benefit from skilled therapeutic intervention in order to improve the following deficits and impairments:  Pain,Postural dysfunction,Decreased knowledge of precautions  Visit Diagnosis: Abnormal posture - Plan: PT plan of care cert/re-cert  Malignant 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, estrogen receptor positive, unspecified site of breast ([place]) - Plan: PT plan of care cert/re-cert   Patient will follow up at outpatient cancer rehab 3-4 weeks following surgery.  If the patient requires physical therapy at that time, a specific plan will be dictated and sent to the referring physician for approval. The patient was educated today on appropriate basic range of motion exercises to begin post operatively and the importance of attending the After Breast Cancer class following surgery.  Patient was educated today on lymphedema risk reduction practices as it pertains to recommendations that will benefit the patient immediately following  surgery.  She verbalized good understanding.     Problem List Patient Active Problem List   Diagnosis Date Noted  . 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[place]) 03/14/2021  . Genetic testing 11/22/2020  . Family history of ovarian cancer   . Candidal vulvovaginitis 11/01/2020  . Low back pain 10/06/2020  . Encounter for medical examination to establish care 09/26/2020  . Malignant neoplasm of upper-outer quadrant of left breast in female, estrogen receptor positive ([place]) 09/26/2020  . BACK STRAIN, LUMBAR 03/08/2010  . INTERNAL HEMORRHOIDS 01/27/2010  . ANAL FISSURE 01/27/2010  . OSTEOARTHRITIS, HANDS, BILATERAL 01/04/2010  . ARTHRALGIA 10/05/2009  . INSOMNIA, CHRONIC 05/06/2009  . ALLERGIC RHINITIS, SEASONAL 02/09/2009  . TOBACCO ABUSE 12/29/2008  . IBS 10/13/2008  . Anxiety state 08/25/2008  . DEPRESSION, RECURRENT 08/25/2008    Allyson Sabal [facility] 03/15/2021, 2:02 PM  [place], [place], [address] Phone: [phone]   Fax:  [phone]  Name: VELA RENDER MRN: [id number] Date of Birth: December 05, 1970  Manus Gunning, PT 03/15/21 2:02 PM

## 2021-03-15 NOTE — Telephone Encounter (Signed)
Spoke with patient who is getting surgery on 5/26, will place her on medical hold for CARE until after recovery. Patient will need clearance and noted restrictions (if any) when coming back to start exercise again. Patient will call us next week and let us know what her doctor says on any limitations.

## 2021-03-16 ENCOUNTER — Encounter: Payer: Self-pay | Admitting: Oncology

## 2021-03-16 ENCOUNTER — Other Ambulatory Visit: Payer: Self-pay

## 2021-03-16 MED ORDER — LORAZEPAM 0.5 MG PO TABS: ORAL_TABLET | ORAL | 0 refills | Status: DC

## 2021-03-16 NOTE — Anesthesia Preprocedure Evaluation (Addendum)
Anesthesia Evaluation  Patient identified by MRN, date of birth, ID band Patient awake    Reviewed: Allergy & Precautions, H&P , NPO status , Patient's Chart, lab work & pertinent test results  History of Anesthesia Complications (+) PONV and history of anesthetic complications  Airway Mallampati: II  TM Distance: >3 FB Neck ROM: Full    Dental no notable dental hx. (+) Edentulous Upper, Edentulous Lower, Upper Dentures, Lower Dentures   Pulmonary neg pulmonary ROS, Current Smoker,    Pulmonary exam normal breath sounds clear to auscultation       Cardiovascular Exercise Tolerance: Good negative cardio ROS Normal cardiovascular exam Rhythm:Regular Rate:Normal     Neuro/Psych PSYCHIATRIC DISORDERS Anxiety Depression negative neurological ROS  negative psych ROS   GI/Hepatic negative GI ROS, Neg liver ROS, GERD  Medicated,  Endo/Other  negative endocrine ROS  Renal/GU negative Renal ROS  negative genitourinary   Musculoskeletal negative musculoskeletal ROS (+) Arthritis , Osteoarthritis,    Abdominal   Peds negative pediatric ROS (+)  Hematology negative hematology ROS (+)   Anesthesia Other Findings   Reproductive/Obstetrics negative OB ROS                            Anesthesia Physical Anesthesia Plan  ASA: III  Anesthesia Plan: General and Regional   Post-op Pain Management: GA combined w/ Regional for post-op pain   Induction: Intravenous  PONV Risk Score and Plan: 3 and Ondansetron, Dexamethasone, Treatment may vary due to age or medical condition and Midazolam  Airway Management Planned: Oral ETT and LMA  Additional Equipment:   Intra-op Plan:   Post-operative Plan: Extubation in OR  Informed Consent: I have reviewed the patients History and Physical, chart, labs and discussed the procedure including the risks, benefits and alternatives for the proposed anesthesia with  the patient or authorized representative who has indicated his/her understanding and acceptance.       Plan Discussed with: Anesthesiologist and CRNA  Anesthesia Plan Comments:        Anesthesia Quick Evaluation

## 2021-03-17 ENCOUNTER — Other Ambulatory Visit: Payer: Self-pay

## 2021-03-17 ENCOUNTER — Observation Stay (HOSPITAL_BASED_OUTPATIENT_CLINIC_OR_DEPARTMENT_OTHER)
Admission: RE | Admit: 2021-03-17 | Discharge: 2021-03-18 | Disposition: A | Discharge status: Home / Self Care | Payer: No Typology Code available for payment source | Attending: Plastic Surgery | Admitting: Plastic Surgery

## 2021-03-17 ENCOUNTER — Encounter (HOSPITAL_BASED_OUTPATIENT_CLINIC_OR_DEPARTMENT_OTHER): Payer: Self-pay | Admitting: Surgery

## 2021-03-17 ENCOUNTER — Ambulatory Visit (HOSPITAL_BASED_OUTPATIENT_CLINIC_OR_DEPARTMENT_OTHER): Payer: No Typology Code available for payment source | Admitting: Anesthesiology

## 2021-03-17 ENCOUNTER — Encounter (HOSPITAL_BASED_OUTPATIENT_CLINIC_OR_DEPARTMENT_OTHER)
Admission: RE | Disposition: A | Discharge status: Home / Self Care | Payer: Self-pay | Source: Home / Self Care | Attending: Plastic Surgery

## 2021-03-17 DIAGNOSIS — Z79899 Other long term (current) drug therapy: Secondary | ICD-10-CM | POA: Insufficient documentation

## 2021-03-17 DIAGNOSIS — C50412 Malignant neoplasm of upper-outer quadrant of left female breast: Principal | ICD-10-CM | POA: Insufficient documentation

## 2021-03-17 DIAGNOSIS — N6011 Diffuse cystic mastopathy of right breast: Secondary | ICD-10-CM | POA: Insufficient documentation

## 2021-03-17 DIAGNOSIS — F17210 Nicotine dependence, cigarettes, uncomplicated: Secondary | ICD-10-CM | POA: Insufficient documentation

## 2021-03-17 DIAGNOSIS — Z170 Estrogen receptor positive status [ER+]: Secondary | ICD-10-CM | POA: Diagnosis not present

## 2021-03-17 DIAGNOSIS — C50919 Malignant neoplasm of unspecified site of unspecified female breast: Secondary | ICD-10-CM | POA: Diagnosis present

## 2021-03-17 HISTORY — DX: Other specified postprocedural states: R11.2

## 2021-03-17 HISTORY — DX: Other specified postprocedural states: Z98.890

## 2021-03-17 HISTORY — PX: TOTAL MASTECTOMY: SHX6129

## 2021-03-17 HISTORY — PX: MODIFIED MASTECTOMY: SHX5268

## 2021-03-17 HISTORY — PX: PORTA CATH REMOVAL: CATH118286

## 2021-03-17 HISTORY — PX: BREAST RECONSTRUCTION WITH PLACEMENT OF TISSUE EXPANDER AND FLEX HD (ACELLULAR HYDRATED DERMIS): SHX6295

## 2021-03-17 SURGERY — MODIFIED MASTECTOMY
Anesthesia: Regional | Site: Chest | Laterality: Right

## 2021-03-17 MED ORDER — DIPHENHYDRAMINE HCL 50 MG/ML IJ SOLN
INTRAMUSCULAR | Status: DC | PRN
  Administered 2021-03-17: 12.5 mg via INTRAVENOUS

## 2021-03-17 MED ORDER — FENTANYL CITRATE (PF) 100 MCG/2ML IJ SOLN: 12.5000 ug | INTRAMUSCULAR | Status: DC | PRN

## 2021-03-17 MED ORDER — FENTANYL CITRATE (PF) 100 MCG/2ML IJ SOLN
INTRAMUSCULAR | Status: AC
  Filled 2021-03-17: qty 2

## 2021-03-17 MED ORDER — MIDAZOLAM HCL 5 MG/5ML IJ SOLN
INTRAMUSCULAR | Status: DC | PRN
  Administered 2021-03-17 (×2): 1 mg via INTRAVENOUS

## 2021-03-17 MED ORDER — ACETAMINOPHEN 500 MG PO TABS: 1000.0000 mg | ORAL_TABLET | ORAL | Status: AC

## 2021-03-17 MED ORDER — EPHEDRINE 5 MG/ML INJ
INTRAVENOUS | Status: AC
  Filled 2021-03-17: qty 10

## 2021-03-17 MED ORDER — PHENYLEPHRINE HCL (PRESSORS) 10 MG/ML IV SOLN
INTRAVENOUS | Status: DC | PRN
  Administered 2021-03-17 (×14): 80 ug via INTRAVENOUS

## 2021-03-17 MED ORDER — DEXAMETHASONE SODIUM PHOSPHATE 4 MG/ML IJ SOLN
INTRAMUSCULAR | Status: DC | PRN
  Administered 2021-03-17: 10 mg via INTRAVENOUS

## 2021-03-17 MED ORDER — CEFAZOLIN SODIUM-DEXTROSE 2-4 GM/100ML-% IV SOLN
INTRAVENOUS | Status: AC
  Filled 2021-03-17: qty 100

## 2021-03-17 MED ORDER — ACETAMINOPHEN 160 MG/5ML PO SOLN
325.0000 mg | ORAL | Status: DC | PRN
Start: 2021-03-17 — End: 2021-03-17

## 2021-03-17 MED ORDER — ONDANSETRON HCL 4 MG/2ML IJ SOLN
4.0000 mg | Freq: Once | INTRAMUSCULAR | Status: DC | PRN
Start: 2021-03-17 — End: 2021-03-17

## 2021-03-17 MED ORDER — SENNA 8.6 MG PO TABS: 1.0000 | ORAL_TABLET | Freq: Two times a day (BID) | ORAL | Status: DC

## 2021-03-17 MED ORDER — ACETAMINOPHEN 500 MG PO TABS
ORAL_TABLET | ORAL | Status: AC
  Filled 2021-03-17: qty 2

## 2021-03-17 MED ORDER — LIDOCAINE 2% (20 MG/ML) 5 ML SYRINGE
INTRAMUSCULAR | Status: AC
  Filled 2021-03-17: qty 5

## 2021-03-17 MED ORDER — BUPIVACAINE LIPOSOME 1.3 % IJ SUSP
INTRAMUSCULAR | Status: DC | PRN
  Administered 2021-03-17 (×2): 10 mL

## 2021-03-17 MED ORDER — ACETAMINOPHEN 500 MG PO TABS
1000.0000 mg | ORAL_TABLET | ORAL | Status: AC
  Administered 2021-03-17: 1000 mg via ORAL

## 2021-03-17 MED ORDER — PHENYLEPHRINE 40 MCG/ML (10ML) SYRINGE FOR IV PUSH (FOR BLOOD PRESSURE SUPPORT)
PREFILLED_SYRINGE | INTRAVENOUS | Status: AC
  Filled 2021-03-17: qty 10

## 2021-03-17 MED ORDER — ONDANSETRON HCL 4 MG/2ML IJ SOLN
INTRAMUSCULAR | Status: AC
  Filled 2021-03-17: qty 2

## 2021-03-17 MED ORDER — LACTATED RINGERS IV SOLN: INTRAVENOUS | Status: DC

## 2021-03-17 MED ORDER — SCOPOLAMINE 1 MG/3DAYS TD PT72
MEDICATED_PATCH | TRANSDERMAL | Status: AC
  Filled 2021-03-17: qty 1

## 2021-03-17 MED ORDER — DEXMEDETOMIDINE (PRECEDEX) IN NS 20 MCG/5ML (4 MCG/ML) IV SYRINGE
PREFILLED_SYRINGE | INTRAVENOUS | Status: AC
  Filled 2021-03-17: qty 5

## 2021-03-17 MED ORDER — CHLORHEXIDINE GLUCONATE CLOTH 2 % EX PADS: 6.0000 | MEDICATED_PAD | Freq: Once | CUTANEOUS | Status: DC

## 2021-03-17 MED ORDER — CEFAZOLIN SODIUM-DEXTROSE 2-4 GM/100ML-% IV SOLN
2.0000 g | Freq: Three times a day (TID) | INTRAVENOUS | Status: DC
  Administered 2021-03-17 – 2021-03-18 (×2): 2 g via INTRAVENOUS
  Filled 2021-03-17 (×2): qty 100

## 2021-03-17 MED ORDER — FENTANYL CITRATE (PF) 100 MCG/2ML IJ SOLN
INTRAMUSCULAR | Status: DC | PRN
  Administered 2021-03-17: 25 ug via INTRAVENOUS
  Administered 2021-03-17: 50 ug via INTRAVENOUS
  Administered 2021-03-17: 25 ug via INTRAVENOUS

## 2021-03-17 MED ORDER — DEXMEDETOMIDINE (PRECEDEX) IN NS 20 MCG/5ML (4 MCG/ML) IV SYRINGE
PREFILLED_SYRINGE | INTRAVENOUS | Status: DC | PRN
  Administered 2021-03-17 (×2): 4 ug via INTRAVENOUS

## 2021-03-17 MED ORDER — CEFAZOLIN SODIUM-DEXTROSE 2-4 GM/100ML-% IV SOLN: 2.0000 g | INTRAVENOUS | Status: AC

## 2021-03-17 MED ORDER — DEXAMETHASONE SODIUM PHOSPHATE 10 MG/ML IJ SOLN
INTRAMUSCULAR | Status: AC
  Filled 2021-03-17: qty 1

## 2021-03-17 MED ORDER — DIPHENHYDRAMINE HCL 50 MG/ML IJ SOLN: 12.5000 mg | Freq: Four times a day (QID) | INTRAMUSCULAR | Status: DC | PRN

## 2021-03-17 MED ORDER — DIPHENHYDRAMINE HCL 50 MG/ML IJ SOLN
INTRAMUSCULAR | Status: AC
  Filled 2021-03-17: qty 1

## 2021-03-17 MED ORDER — IBUPROFEN 200 MG PO TABS
400.0000 mg | ORAL_TABLET | Freq: Four times a day (QID) | ORAL | Status: DC
  Administered 2021-03-17 – 2021-03-18 (×3): 400 mg via ORAL
  Filled 2021-03-17 (×2): qty 2

## 2021-03-17 MED ORDER — BUPIVACAINE HCL (PF) 0.25 % IJ SOLN
INTRAMUSCULAR | Status: AC
  Filled 2021-03-17: qty 30

## 2021-03-17 MED ORDER — POLYETHYLENE GLYCOL 3350 17 G PO PACK: 17.0000 g | PACK | Freq: Every day | ORAL | Status: DC | PRN

## 2021-03-17 MED ORDER — PROPOFOL 500 MG/50ML IV EMUL
INTRAVENOUS | Status: AC
  Filled 2021-03-17: qty 50

## 2021-03-17 MED ORDER — DROPERIDOL 2.5 MG/ML IJ SOLN
INTRAMUSCULAR | Status: AC
  Filled 2021-03-17: qty 2

## 2021-03-17 MED ORDER — SCOPOLAMINE 1 MG/3DAYS TD PT72
MEDICATED_PATCH | TRANSDERMAL | Status: DC | PRN
  Administered 2021-03-17: 1 via TRANSDERMAL

## 2021-03-17 MED ORDER — MEPERIDINE HCL 25 MG/ML IJ SOLN: 6.2500 mg | INTRAMUSCULAR | Status: DC | PRN

## 2021-03-17 MED ORDER — ONDANSETRON HCL 4 MG/2ML IJ SOLN
INTRAMUSCULAR | Status: DC | PRN
  Administered 2021-03-17: 4 mg via INTRAVENOUS

## 2021-03-17 MED ORDER — ZOLPIDEM TARTRATE 5 MG PO TABS: 5.0000 mg | ORAL_TABLET | Freq: Every evening | ORAL | Status: DC | PRN

## 2021-03-17 MED ORDER — PROPOFOL 500 MG/50ML IV EMUL
INTRAVENOUS | Status: DC | PRN
  Administered 2021-03-17: 25 ug/kg/min via INTRAVENOUS

## 2021-03-17 MED ORDER — SODIUM CHLORIDE 0.9 % IV SOLN
INTRAVENOUS | Status: AC
  Filled 2021-03-17: qty 10

## 2021-03-17 MED ORDER — MIDAZOLAM HCL 2 MG/2ML IJ SOLN
INTRAMUSCULAR | Status: AC
  Filled 2021-03-17: qty 2

## 2021-03-17 MED ORDER — METHOCARBAMOL 500 MG PO TABS
500.0000 mg | ORAL_TABLET | Freq: Four times a day (QID) | ORAL | Status: DC | PRN
  Administered 2021-03-17: 500 mg via ORAL
  Filled 2021-03-17: qty 1

## 2021-03-17 MED ORDER — OXYCODONE HCL 5 MG PO TABS: 5.0000 mg | ORAL_TABLET | Freq: Once | ORAL | Status: DC | PRN

## 2021-03-17 MED ORDER — ONDANSETRON HCL 4 MG/2ML IJ SOLN
4.0000 mg | Freq: Four times a day (QID) | INTRAMUSCULAR | Status: DC | PRN
Start: 2021-03-17 — End: 2021-03-18

## 2021-03-17 MED ORDER — DROPERIDOL 2.5 MG/ML IJ SOLN
INTRAMUSCULAR | Status: DC | PRN
  Administered 2021-03-17: .625 mg via INTRAVENOUS

## 2021-03-17 MED ORDER — SODIUM CHLORIDE 0.9 % IV SOLN
INTRAVENOUS | Status: DC | PRN
  Administered 2021-03-17: 500 mL

## 2021-03-17 MED ORDER — EPHEDRINE SULFATE 50 MG/ML IJ SOLN
INTRAMUSCULAR | Status: DC | PRN
  Administered 2021-03-17 (×2): 10 mg via INTRAVENOUS

## 2021-03-17 MED ORDER — BUPIVACAINE-EPINEPHRINE (PF) 0.25% -1:200000 IJ SOLN
INTRAMUSCULAR | Status: DC | PRN
  Administered 2021-03-17 (×2): 20 mL

## 2021-03-17 MED ORDER — CEFAZOLIN SODIUM-DEXTROSE 2-4 GM/100ML-% IV SOLN
2.0000 g | INTRAVENOUS | Status: AC
  Administered 2021-03-17: 2 g via INTRAVENOUS

## 2021-03-17 MED ORDER — DIAZEPAM 2 MG PO TABS
2.0000 mg | ORAL_TABLET | Freq: Two times a day (BID) | ORAL | Status: DC | PRN
  Administered 2021-03-17: 2 mg via ORAL
  Filled 2021-03-17: qty 1

## 2021-03-17 MED ORDER — OXYCODONE HCL 5 MG/5ML PO SOLN: 5.0000 mg | Freq: Once | ORAL | Status: DC | PRN

## 2021-03-17 MED ORDER — DIPHENHYDRAMINE HCL 12.5 MG/5ML PO ELIX: 12.5000 mg | ORAL_SOLUTION | Freq: Four times a day (QID) | ORAL | Status: DC | PRN

## 2021-03-17 MED ORDER — ONDANSETRON 4 MG PO TBDP: 4.0000 mg | ORAL_TABLET | Freq: Four times a day (QID) | ORAL | Status: DC | PRN

## 2021-03-17 MED ORDER — ACETAMINOPHEN 325 MG PO TABS
325.0000 mg | ORAL_TABLET | Freq: Four times a day (QID) | ORAL | Status: DC
  Administered 2021-03-17 – 2021-03-18 (×3): 325 mg via ORAL
  Filled 2021-03-17 (×2): qty 1

## 2021-03-17 MED ORDER — FENTANYL CITRATE (PF) 100 MCG/2ML IJ SOLN
25.0000 ug | INTRAMUSCULAR | Status: DC | PRN
  Administered 2021-03-17 (×2): 50 ug via INTRAVENOUS

## 2021-03-17 MED ORDER — ACETAMINOPHEN 325 MG PO TABS: 325.0000 mg | ORAL_TABLET | ORAL | Status: DC | PRN

## 2021-03-17 MED ORDER — PROPOFOL 10 MG/ML IV BOLUS
INTRAVENOUS | Status: AC
  Filled 2021-03-17: qty 20

## 2021-03-17 MED ORDER — OXYCODONE HCL 5 MG PO TABS
5.0000 mg | ORAL_TABLET | ORAL | Status: DC | PRN
  Administered 2021-03-17 – 2021-03-18 (×4): 10 mg via ORAL
  Filled 2021-03-17 (×4): qty 2

## 2021-03-17 MED ORDER — PROPOFOL 10 MG/ML IV BOLUS
INTRAVENOUS | Status: DC | PRN
  Administered 2021-03-17: 40 mg via INTRAVENOUS
  Administered 2021-03-17: 160 mg via INTRAVENOUS

## 2021-03-17 MED ORDER — FENTANYL CITRATE (PF) 100 MCG/2ML IJ SOLN
100.0000 ug | Freq: Once | INTRAMUSCULAR | Status: AC
  Administered 2021-03-17: 100 ug via INTRAVENOUS

## 2021-03-17 MED ORDER — MIDAZOLAM HCL 2 MG/2ML IJ SOLN
2.0000 mg | Freq: Once | INTRAMUSCULAR | Status: AC
  Administered 2021-03-17: 2 mg via INTRAVENOUS

## 2021-03-17 MED ORDER — LIDOCAINE HCL (CARDIAC) PF 100 MG/5ML IV SOSY
PREFILLED_SYRINGE | INTRAVENOUS | Status: DC | PRN
  Administered 2021-03-17: 100 mg via INTRAVENOUS

## 2021-03-17 SURGICAL SUPPLY — 99 items
ADH SKN CLS APL DERMABOND .7 (GAUZE/BANDAGES/DRESSINGS) ×8
APL PRP STRL LF DISP 70% ISPRP (MISCELLANEOUS) ×8
APL SKNCLS STERI-STRIP NONHPOA (GAUZE/BANDAGES/DRESSINGS)
APPLIER CLIP 11 MED OPEN (CLIP) ×10
APPLIER CLIP 9.375 MED OPEN (MISCELLANEOUS)
APR CLP MED 11 20 MLT OPN (CLIP) ×8
APR CLP MED 9.3 20 MLT OPN (MISCELLANEOUS)
BAG DECANTER FOR FLEXI CONT (MISCELLANEOUS) ×5 IMPLANT
BENZOIN TINCTURE PRP APPL 2/3 (GAUZE/BANDAGES/DRESSINGS) IMPLANT
BINDER BREAST LRG (GAUZE/BANDAGES/DRESSINGS) IMPLANT
BINDER BREAST MEDIUM (GAUZE/BANDAGES/DRESSINGS) ×5 IMPLANT
BINDER BREAST XLRG (GAUZE/BANDAGES/DRESSINGS) IMPLANT
BINDER BREAST XXLRG (GAUZE/BANDAGES/DRESSINGS) IMPLANT
BIOPATCH RED 1 DISK 7.0 (GAUZE/BANDAGES/DRESSINGS) ×5 IMPLANT
BLADE HEX COATED 2.75 (ELECTRODE) ×5 IMPLANT
BLADE SURG 10 STRL SS (BLADE) ×10 IMPLANT
BLADE SURG 15 STRL LF DISP TIS (BLADE) ×4 IMPLANT
BLADE SURG 15 STRL SS (BLADE) ×5
BNDG ELASTIC 6X5.8 VLCR STR LF (GAUZE/BANDAGES/DRESSINGS) IMPLANT
BNDG GAUZE ELAST 4 BULKY (GAUZE/BANDAGES/DRESSINGS) ×10 IMPLANT
CANISTER SUCT 1200ML W/VALVE (MISCELLANEOUS) ×10 IMPLANT
CHLORAPREP W/TINT 26 (MISCELLANEOUS) ×10 IMPLANT
CLIP APPLIE 11 MED OPEN (CLIP) ×8 IMPLANT
CLIP APPLIE 9.375 MED OPEN (MISCELLANEOUS) IMPLANT
COVER BACK TABLE 60X90IN (DRAPES) ×10 IMPLANT
COVER MAYO STAND STRL (DRAPES) ×10 IMPLANT
COVER WAND RF STERILE (DRAPES) IMPLANT
DECANTER SPIKE VIAL GLASS SM (MISCELLANEOUS) IMPLANT
DERMABOND ADVANCED (GAUZE/BANDAGES/DRESSINGS) ×2
DERMABOND ADVANCED .7 DNX12 (GAUZE/BANDAGES/DRESSINGS) ×8 IMPLANT
DRAIN CHANNEL 19F RND (DRAIN) ×10 IMPLANT
DRAPE LAPAROSCOPIC ABDOMINAL (DRAPES) ×10 IMPLANT
DRAPE UTILITY XL STRL (DRAPES) ×5 IMPLANT
DRSG OPSITE POSTOP 4X6 (GAUZE/BANDAGES/DRESSINGS) IMPLANT
DRSG PAD ABDOMINAL 8X10 ST (GAUZE/BANDAGES/DRESSINGS) ×10 IMPLANT
DRSG TEGADERM 4X10 (GAUZE/BANDAGES/DRESSINGS) IMPLANT
ELECT BLADE 4.0 EZ CLEAN MEGAD (MISCELLANEOUS) ×5
ELECT REM PT RETURN 9FT ADLT (ELECTROSURGICAL) ×10
ELECTRODE BLDE 4.0 EZ CLN MEGD (MISCELLANEOUS) ×4 IMPLANT
ELECTRODE REM PT RTRN 9FT ADLT (ELECTROSURGICAL) ×8 IMPLANT
EVACUATOR SILICONE 100CC (DRAIN) ×10 IMPLANT
FUNNEL KELLER 2 DISP (MISCELLANEOUS) IMPLANT
GAUZE SPONGE 4X4 12PLY STRL LF (GAUZE/BANDAGES/DRESSINGS) IMPLANT
GLOVE SRG 8 PF TXTR STRL LF DI (GLOVE) ×8 IMPLANT
GLOVE SURG ENC MOIS LTX SZ6.5 (GLOVE) ×20 IMPLANT
GLOVE SURG LTX SZ8 (GLOVE) ×5 IMPLANT
GLOVE SURG UNDER POLY LF SZ8 (GLOVE) ×10
GOWN STRL REUS W/ TWL LRG LVL3 (GOWN DISPOSABLE) ×12 IMPLANT
GOWN STRL REUS W/ TWL XL LVL3 (GOWN DISPOSABLE) ×4 IMPLANT
GOWN STRL REUS W/TWL LRG LVL3 (GOWN DISPOSABLE) ×15
GOWN STRL REUS W/TWL XL LVL3 (GOWN DISPOSABLE) ×5
GRAFT FLEX HD 6X16 PLIABLE (Tissue) ×10 IMPLANT
IMPL EXPANDER BREAST 455CC (Breast) ×8 IMPLANT
IMPLANT BREAST 455CC (Breast) ×2 IMPLANT
IMPLANT EXPANDER BREAST 455CC (Breast) ×8 IMPLANT
IV NS 1000ML (IV SOLUTION) ×5
IV NS 1000ML BAXH (IV SOLUTION) ×4 IMPLANT
IV NS 500ML (IV SOLUTION)
IV NS 500ML BAXH (IV SOLUTION) IMPLANT
KIT FILL ASEPTIC TRANSFER (MISCELLANEOUS) ×10 IMPLANT
KIT FILL SYSTEM UNIVERSAL (SET/KITS/TRAYS/PACK) ×5 IMPLANT
NEEDLE HYPO 25X1 1.5 SAFETY (NEEDLE) ×5 IMPLANT
NS IRRIG 1000ML POUR BTL (IV SOLUTION) ×5 IMPLANT
PACK BASIN DAY SURGERY FS (CUSTOM PROCEDURE TRAY) ×10 IMPLANT
PAD FOAM SILICONE BACKED (GAUZE/BANDAGES/DRESSINGS) IMPLANT
PENCIL SMOKE EVACUATOR (MISCELLANEOUS) ×10 IMPLANT
PIN SAFETY STERILE (MISCELLANEOUS) ×10 IMPLANT
SLEEVE SCD COMPRESS KNEE MED (STOCKING) ×10 IMPLANT
SPONGE LAP 18X18 RF (DISPOSABLE) ×20 IMPLANT
SPONGE LAP 4X18 RFD (DISPOSABLE) IMPLANT
STAPLER VISISTAT 35W (STAPLE) ×5 IMPLANT
STRIP CLOSURE SKIN 1/2X4 (GAUZE/BANDAGES/DRESSINGS) ×20 IMPLANT
STRIP SUTURE WOUND CLOSURE 1/2 (MISCELLANEOUS) IMPLANT
SUT CHROMIC 3 0 SH 27 (SUTURE) IMPLANT
SUT ETHILON 2 0 FS 18 (SUTURE) IMPLANT
SUT MNCRL AB 3-0 PS2 18 (SUTURE) IMPLANT
SUT MNCRL AB 4-0 PS2 18 (SUTURE) ×20 IMPLANT
SUT MON AB 3-0 SH 27 (SUTURE) ×15
SUT MON AB 3-0 SH27 (SUTURE) ×12 IMPLANT
SUT MON AB 4-0 PC3 18 (SUTURE) IMPLANT
SUT MON AB 5-0 PS2 18 (SUTURE) ×10 IMPLANT
SUT PDS 3-0 CT2 (SUTURE)
SUT PDS AB 2-0 CT2 27 (SUTURE) ×45 IMPLANT
SUT PDS II 3-0 CT2 27 ABS (SUTURE) IMPLANT
SUT SILK 2 0 PERMA HAND 18 BK (SUTURE) IMPLANT
SUT SILK 2 0 SH (SUTURE) IMPLANT
SUT SILK 3 0 PS 1 (SUTURE) ×15 IMPLANT
SUT VIC AB 3-0 54X BRD REEL (SUTURE) IMPLANT
SUT VIC AB 3-0 BRD 54 (SUTURE)
SUT VIC AB 3-0 SH 27 (SUTURE)
SUT VIC AB 3-0 SH 27X BRD (SUTURE) IMPLANT
SUT VICRYL 3-0 CR8 SH (SUTURE) ×5 IMPLANT
SYR BULB IRRIG 60ML STRL (SYRINGE) ×5 IMPLANT
SYR CONTROL 10ML LL (SYRINGE) IMPLANT
TOWEL GREEN STERILE FF (TOWEL DISPOSABLE) ×20 IMPLANT
TRAY DSU PREP LF (CUSTOM PROCEDURE TRAY) ×5 IMPLANT
TUBE CONNECTING 20X1/4 (TUBING) ×10 IMPLANT
UNDERPAD 30X36 HEAVY ABSORB (UNDERPADS AND DIAPERS) ×10 IMPLANT
YANKAUER SUCT BULB TIP NO VENT (SUCTIONS) ×10 IMPLANT

## 2021-03-17 NOTE — Interval H&P Note (Signed)
History and Physical Interval Note:  03/17/2021 7:17 AM  Tricia Berry  has presented today for surgery, with the diagnosis of LEFT BREAST CANCER.  The various methods of treatment have been discussed with the patient and family. After consideration of risks, benefits and other options for treatment, the patient has consented to  Procedure(s): LEFT MODIFIED RADICAL MASTECTOMY (Left) TOTAL MASTECTOMY (Right) IMMEDIATE BILATERAL BREAST RECONSTRUCTION WITH PLACEMENT OF TISSUE EXPANDER AND FLEX HD (ACELLULAR HYDRATED DERMIS) (Bilateral) as a surgical intervention.  The patient's history has been reviewed, patient examined, no change in status, stable for surgery.  I have reviewed the patient's chart and labs.  Questions were answered to the patient's satisfaction.     Teller

## 2021-03-17 NOTE — Op Note (Signed)
Op report    DATE OF OPERATION:  03/17/2021  LOCATION: [facility]  SURGICAL DIVISION: Plastic Surgery  PREOPERATIVE DIAGNOSES:  1. Left Breast cancer.    POSTOPERATIVE DIAGNOSES:  1. Left Breast cancer.   PROCEDURE:  1. Bilateral immediate breast reconstruction with placement of Acellular Dermal Matrix and tissue expanders.  SURGEON: Burkley Dech Sanger Yarielys Beed, DO  ASSISTANT: Rodman Key Scheeler  ANESTHESIA:  General.   COMPLICATIONS: None.   IMPLANTS: Left - Mentor 455 cc. Ref #[id number].  Serial Number [id number], 150 cc of injectable saline placed in the expander. Right - Mentor 455 cc. Ref #[id number].  Serial Number [id number], 150 cc of injectable saline placed in the expander. Acellular Dermal Matrix Flex HD 6 x 16 cm two  INDICATIONS FOR PROCEDURE:  The patient, Tricia Berry, is a 50 y.o. female born on 01-Sep-1971, is here for  immediate first stage breast reconstruction with placement of bilateral tissue expander and Acellular dermal matrix. MRN: [id number]  CONSENT:  Informed consent was obtained directly from the patient. Risks, benefits and alternatives were fully discussed. Specific risks including but not limited to bleeding, infection, hematoma, seroma, scarring, pain, implant infection, implant extrusion, capsular contracture, asymmetry, wound healing problems, and need for further surgery were all discussed. The patient did have an ample opportunity to have her questions answered to her satisfaction.   DESCRIPTION OF PROCEDURE:  The patient was taken to the operating room by the general surgery team. SCDs were placed and IV antibiotics were given. The patient's chest was prepped and draped in a sterile fashion. A time out was performed and the implants to be used were identified.  Bilateral mastectomies were performed.  Once the general surgery team had completed their portion of the case the patient was rendered to the plastic and  reconstructive surgery team.  Left:  The pectoralis major muscle was lifted from the chest wall with release of the lateral edge and lateral inframammary fold.  The pocket was irrigated with antibiotic solution and hemostasis was achieved with electrocautery.  The ADM was then prepared according to the manufacture guidelines and slits placed to help with postoperative fluid management.  The ADM was then sutured to the inferior and lateral edge of the inframammary fold with 2-0 PDS starting with an interrupted stitch and then a running stitch.  The lateral portion was sutured to with interrupted sutures after the expander was placed.  The expander was prepared according to the manufacture guidelines, the air evacuated and then it was placed under the ADM and pectoralis major muscle.  The inferior and lateral tabs were used to secure the expander to the chest wall with 2-0 PDS.  The drain was placed at the inframammary fold over the ADM and secured to the skin with 3-0 Silk.   The deep layers were closed with 3-0 Monocryl followed by 4-0 Monocryl.  The skin was closed with 5-0 Monocryl and then dermabond was applied.    Right:  The pectoralis major muscle was lifted from the chest wall with release of the lateral edge and lateral inframammary fold.  The pocket was irrigated with antibiotic solution and hemostasis was achieved with electrocautery.  The ADM was then prepared according to the manufacture guidelines and slits placed to help with postoperative fluid management.  The ADM was then sutured to the inferior and lateral edge of the inframammary fold with 2-0 PDS starting with an interrupted stitch and then a running stitch.  The lateral portion was sutured to with  interrupted sutures after the expander was placed.  The expander was prepared according to the manufacture guidelines, the air evacuated and then it was placed under the ADM and pectoralis major muscle.  The inferior and lateral tabs were used to  secure the expander to the chest wall with 2-0 PDS.  The drain was placed at the inframammary fold over the ADM and secured to the skin with 3-0 Silk.  The deep layers were closed with 3-0 Monocryl followed by 4-0 Monocryl.  The skin was closed with 5-0 Monocryl and then dermabond was applied.  The ABDs and breast binder were placed.  The patient tolerated the procedure well and there were no complications.  The patient was allowed to wake from anesthesia and taken to the recovery room in satisfactory condition.   The advanced practice practitioner (APP) assisted throughout the case.  The APP was essential in retraction and counter traction when needed to make the case progress smoothly.  This retraction and assistance made it possible to see the tissue plans for the procedure.  The assistance was needed for blood control, tissue re-approximation and assisted with closure of the incision site.

## 2021-03-17 NOTE — Anesthesia Procedure Notes (Signed)
Anesthesia Regional Block: Pectoralis block   Pre-Anesthetic Checklist: ,, timeout performed, Correct Patient, Correct Site, Correct Laterality, Correct Procedure, Correct Position, site marked, Risks and benefits discussed,  Surgical consent,  Pre-op evaluation,  At surgeon's request and post-op pain management  Laterality: Right and Left  Prep: chloraprep       Needles:  Injection technique: Single-shot  Needle Type: Echogenic Stimulator Needle     Needle Length: 5cm  Needle Gauge: 22     Additional Needles:   Procedures:, nerve stimulator,,, ultrasound used (permanent image in chart),,,,   Nerve Stimulator or Paresthesia:  Response: =,   Additional Responses:   Narrative:  Start time: 03/17/2021 7:15 AM End time: 03/17/2021 7:25 AM Injection made incrementally with aspirations every 5 mL.  Performed by: Personally  Anesthesiologist: Janeece Riggers, MD  Additional Notes: Functioning IV was confirmed and monitors were applied.  A 10mm 22ga Arrow echogenic stimulator needle was used. Sterile prep and drape,hand hygiene and sterile gloves were used. Ultrasound guidance: relevant anatomy identified, needle position confirmed, local anesthetic spread visualized around nerve(s)., vascular puncture avoided.  Image printed for medical record. Negative aspiration and negative test dose prior to incremental administration of local anesthetic. The patient tolerated the procedure well.

## 2021-03-17 NOTE — Transfer of Care (Signed)
Immediate Anesthesia Transfer of Care Note  Patient: Tricia Berry  Procedure(s) Performed: LEFT MODIFIED RADICAL MASTECTOMY (Left Breast) TOTAL MASTECTOMY (Right Breast) PORTA CATH REMOVAL (Right Chest) IMMEDIATE BILATERAL BREAST RECONSTRUCTION WITH PLACEMENT OF TISSUE EXPANDER AND FLEX HD (ACELLULAR HYDRATED DERMIS) (Bilateral Breast)  Patient Location: PACU  Anesthesia Type:General  Level of Consciousness: awake, alert  and oriented  Airway & Oxygen Therapy: Patient Spontanous Breathing and Patient connected to nasal cannula oxygen  Post-op Assessment: Report given to RN and Post -op Vital signs reviewed and stable  Post vital signs: Reviewed and stable  Last Vitals:  Vitals Value Taken Time  BP 147/87 03/17/21 1105  Temp    Pulse 95 03/17/21 1109  Resp 15 03/17/21 1109  SpO2 99 % 03/17/21 1109  Vitals shown include unvalidated device data.  Last Pain:  Vitals:   03/17/21 0643  TempSrc: Oral  PainSc: 0-No pain         Complications: No complications documented.

## 2021-03-17 NOTE — Interval H&P Note (Signed)
History and Physical Interval Note:  03/17/2021 8:37 AM  Tricia Berry  has presented today for surgery, with the diagnosis of LEFT BREAST CANCER.  The various methods of treatment have been discussed with the patient and family. After consideration of risks, benefits and other options for treatment, the patient has consented to  Procedure(s): LEFT MODIFIED RADICAL MASTECTOMY (Left) TOTAL MASTECTOMY (Right) IMMEDIATE BILATERAL BREAST RECONSTRUCTION WITH PLACEMENT OF TISSUE EXPANDER AND FLEX HD (ACELLULAR HYDRATED DERMIS) (Bilateral) PORTA CATH REMOVAL (Right) as a surgical intervention.  The patient's history has been reviewed, patient examined, no change in status, stable for surgery.  I have reviewed the patient's chart and labs.  Questions were answered to the patient's satisfaction.     [name] Ayerim Berquist

## 2021-03-17 NOTE — Discharge Instructions (Signed)
INSTRUCTIONS FOR AFTER SURGERY   You will likely have some questions about what to expect following your operation.  The following information will help you and your family understand what to expect when you are discharged from the hospital.  Following these guidelines will help ensure a smooth recovery and reduce risks of complications.  Postoperative instructions include information on: diet, wound care, medications and physical activity.  AFTER SURGERY Expect to go home after the procedure.  In some cases, you may need to spend one night in the hospital for observation.  DIET This surgery does not require a specific diet.  However, I have to mention that the healthier you eat the better your body can start healing. It is important to increasing your protein intake.  This means limiting the foods with added sugar.  Focus on fruits and vegetables and some meat. It is very important to drink water after your surgery.  If your urine is bright yellow, then it is concentrated, and you need to drink more water.  As a general rule after surgery, you should have 8 ounces of water every hour while awake.  If you find you are persistently nauseated or unable to take in liquids let us know.  NO TOBACCO USE or EXPOSURE.  This will slow your healing process and increase the risk of a wound.  WOUND CARE Clean with baby wipes for 3-5 days and then you can shower.  If you have a binder you may remove it to shower and then put it back on. If you have steri-strips / tape directly attached to your skin leave them in place. It is OK to get these wet.  No baths, pools or hot tubs for two weeks. We close your incision to leave the smallest and best-looking scar. No ointment or creams on your incisions until given the go ahead.  Especially not Neosporin (Too many skin reactions with this one).  A few weeks after surgery you can use Mederma and start massaging the scar. We ask you to wear your binder or sports bra for the  first 6 weeks around the clock, including while sleeping. This provides added comfort and helps reduce the fluid accumulation at the surgery site.  ACTIVITY No heavy lifting until cleared by the doctor.  It is OK to walk and climb stairs. In fact, moving your legs is very important to decrease your risk of a blood clot.  It will also help keep you from getting deconditioned.  Every 1 to 2 hours get up and walk for 5 minutes. This will help with a quicker recovery back to normal.  Let pain be your guide so you don't do too much.  NO, you cannot do the spring cleaning and don't plan on taking care of anyone else.  This is your time for TLC.   WORK Everyone returns to work at different times. As a rough guide, most people take at least 1 - 2 weeks off prior to returning to work. If you need documentation for your job, bring the forms to your postoperative follow up visit.  DRIVING Arrange for someone to bring you home from the hospital.  You may be able to drive a few days after surgery but not while taking any narcotics or valium.  BOWEL MOVEMENTS Constipation can occur after anesthesia and while taking pain medication.  It is important to stay ahead for your comfort.  We recommend taking Milk of Magnesia (2 tablespoons; twice a day) while taking the  pain pills.  SEROMA This is fluid your body tried to put in the surgical site.  This is normal but if it creates excessive pain and swelling let us know.  It usually decreases in a few weeks.  MEDICATIONS and PAIN CONTROL At your preoperative visit for you history and physical you were given the following medications: 1. An antibiotic: Start this medication when you get home and take according to the instructions on the bottle. 2. Zofran 4 mg:  This is to treat nausea and vomiting.  You can take this every 6 hours as needed and only if needed. 3. Norco (hydrocodone/acetaminophen) 5/325 mg:  This is only to be used after you have taken the motrin or  the tylenol. Every 8 hours as needed. Over the counter Medication to take: 4. Ibuprofen (Motrin) 600 mg:  Take this every 6 hours.  If you have additional pain then take 500 mg of the tylenol.  Only take the Norco after you have tried these two. 5. Miralax or stool softener of choice: Take this according to the bottle if you take the [place] Call your surgeon's office if any of the following occur: . Fever 101 degrees F or greater . Excessive bleeding or fluid from the incision site. . Pain that increases over time without aid from the medications . Redness, warmth, or pus draining from incision sites . Persistent nausea or inability to take in liquids . Severe misshapen area that underwent the operation.  [facility] Plastic Surgery Specialist  What is the benefit of having a drain?  During surgery your tissue layers are separated.  This raw surface stimulates your body to fill the space with serous fluid.  This is normal but you don't want that fluid to collect and prevent healing.  A fluid collection can also become infected.  The Jackson-Pratt (JP) drain is used to eliminate this collection of fluid and allow the tissue to heal together.    Jackson-Pratt (JP) bulb    How to care for your drainage and suction unit at home Your drainage catheter will be connected to a collection device. The vacuum caused when the device is compressed allows drainage to collect in the device.    Wendee Copp your hands with soap and water before and after touching the system. . Empty the JP drain every 12 hours once you get home from your procedure. . Record the fluid amount on the record sheet included. . Start with stripping the drain tube to push the clots or excess fluid to the bulb.  Do this by pinching the tube with one hand near your skin.  Then with the other hand squeeze the tubing and work it toward the bulb.  This should be done several times a day.  This may collapse the tube which will  correct on its own.   . Use a safety pin to attach your collection device to your clothing so there is no tension on the insertion site.   . If you have drainage at the skin insertion site, you can apply a gauze dressing and secure it with tape. . If the drain falls out, apply a gauze dressing over the drain insertion site and secure with tape.   To empty the collection device:   . Release the stopper on the top of the collection unit (bulb).  Signa Kell contents into a measuring container such as a plastic medicine cup.  . Record the day and amount of drainage  on the attached sheet. . This should be done at least twice a day.    To compress the Jackson-Pratt Bulb:  . Release the stopper at the top of the bulb. Marland Kitchen Squeeze the bulb tightly in your fist, squeezing air out of the bulb.  . Replace the stopper while the bulb is compressed.  . Be careful not to spill the contents when squeezing the bulb. . The drainage will start bright red and turn to pink and then yellow with time. . IMPORTANT: If the bulb is not squeezed before adding the stopper it will not draw out the fluid.  Care for the JP drain site and your skin daily:  . You may shower three days after surgery. . Secure the drain to a ribbon or cloth around your waist while showering so it does not pull out while showering. . Be sure your hands are cleaned with soap and water. . Use a clean wet cotton swab to clean the skin around the drain site.  . Use another cotton swab to place Vaseline or antibiotic ointment on the skin around the drain.     Contact your physician if any of the following occur:  Marland Kitchen The fluid in the bulb becomes cloudy. . Your temperature is greater than 101.4.  Marland Kitchen The incision opens. . If you have drainage at the skin insertion site, you can apply a gauze dressing and secure it with tape. . If the drain falls out, apply a gauze dressing over the drain insertion site and secure with tape.  . You will usually have  more drainage when you are active than while you rest or are asleep. If the drainage increases significantly or is bloody call the physician                             Bring this record with you to each office visit Date  Drainage Volume  Date   Drainage volume                                                                                                                                                                                         Information for Discharge Teaching: EXPAREL (bupivacaine liposome injectable suspension)   Your surgeon or anesthesiologist gave you EXPAREL(bupivacaine) to help control your pain after surgery.   EXPAREL is a local anesthetic that provides pain relief by numbing the tissue around the surgical site.  EXPAREL is designed to release pain medication over time and can control pain for up to 72 hours.  Depending on how you respond to EXPAREL, you may  require less pain medication during your recovery.  Possible side effects:  Temporary loss of sensation or ability to move in the area where bupivacaine was injected.  Nausea, vomiting, constipation  Rarely, numbness and tingling in your mouth or lips, lightheadedness, or anxiety may occur.  Call your doctor right away if you think you may be experiencing any of these sensations, or if you have other questions regarding possible side effects.  Follow all other discharge instructions given to you by your surgeon or nurse. Eat a healthy diet and drink plenty of water or other fluids.  If you return to the hospital for any reason within 96 hours following the administration of EXPAREL, it is important for health care providers to know that you have received this anesthetic. A teal colored band has been placed on your arm with the date, time and amount of EXPAREL you have received in order to alert and inform your health care providers. Please leave this armband in place for the full 96  hours following administration, and then you may remove the band.

## 2021-03-17 NOTE — Anesthesia Postprocedure Evaluation (Signed)
Anesthesia Post Note  Patient: Lacye Mccarn Farve  Procedure(s) Performed: LEFT MODIFIED RADICAL MASTECTOMY (Left Breast) TOTAL MASTECTOMY (Right Breast) PORTA CATH REMOVAL (Right Chest) IMMEDIATE BILATERAL BREAST RECONSTRUCTION WITH PLACEMENT OF TISSUE EXPANDER AND FLEX HD (ACELLULAR HYDRATED DERMIS) (Bilateral Breast)     Patient location during evaluation: PACU Anesthesia Type: Regional and General Level of consciousness: awake and alert Pain management: pain level controlled Vital Signs Assessment: post-procedure vital signs reviewed and stable Respiratory status: spontaneous breathing, nonlabored ventilation, respiratory function stable and patient connected to nasal cannula oxygen Cardiovascular status: blood pressure returned to baseline and stable Postop Assessment: no apparent nausea or vomiting Anesthetic complications: no   No complications documented.  Last Vitals:  Vitals:   03/17/21 1225 03/17/21 1245  BP:  132/74  Pulse: 89 82  Resp: (!) 21 18  Temp:  37.1 C  SpO2: 100% 98%    Last Pain:  Vitals:   03/17/21 1245  TempSrc:   PainSc: 5                  Sherrine Salberg

## 2021-03-17 NOTE — Progress Notes (Signed)
Assisted Dr. Ambrose Pancoast with right, left, ultrasound guided, pectoralis block. Side rails up, monitors on throughout procedure. See vital signs in flow sheet. Tolerated Procedure well.

## 2021-03-17 NOTE — Op Note (Signed)
Preoperative diagnosis: Stage III left breast cancer with high risk for breast cancer on the right side and port in place  Postoperative diagnosis: Same  Procedure: Left modified radical mastectomy with right simple mastectomy risk reducing and port removal  Surgeon: Erroll Luna, MD  Anesthesia: Bilateral pectoral block and general  EBL: 30 cc  Specimen: Right breast with stitch on superior margin, left breast stitch superior margin, left axillary contents  Indications for procedure: The patient is a 50 year old female diagnosed with locally advanced left breast cancer.  She was placed on neoadjuvant chemotherapy and had a good response.  She presents today for definitive surgery.  She opted for bilateral mastectomies with reconstruction.  She chose risk reducing mastectomy on the right after a long discussion the pros and cons of this approach despite genetics that did not show any BRCA1 or BRCA2 mutations.  After long discussion and seeing plastic surgery she opted to proceed with surgery and reconstruction.  She was smoking and that was going to be a big issue with reconstruction I talked to her at great length about not doing that which would compromise her reconstruction.  We discussed risks of surgery to be bleeding, infection, nerve injury, blood vessel injury, postoperative hemorrhage, flap necrosis, revisional surgery, removal expanders and or implants, the need for further medical treatment and radiation therapy afterwards, exacerbation of underlying health problems, death, DVT, and the need for other treatments and/or procedures.  We also had a long discussion of lymphedema risk left side and she will see physical therapy postoperatively to help manage that since she is at high risk of that especially on her left side.The surgical and non surgical options have been discussed with the patient.  Risks of surgery include bleeding,  Infection,  Flap necrosis,  Tissue loss,  Chronic pain,  death, Numbness,  And the need for additional procedures.  Reconstruction options also have been discussed with the patient as well.  The patient agrees to proceed.    Description of procedure: The patient was met in the holding area and questions were answered.  She underwent bilateral pectoral blocks per anesthesia protocol.  All questions were answered.  She was taken back to the operating.  She was placed supine upon the OR table.  After induction of general anesthesia, both breasts were prepped and draped in a sterile fashion and timeout performed.  Proper patient, operative procedure, and site discussed.  Right side was done first.  Curvilinear incision was made above and below the nipple-areolar complex.  Superior skin flap taken with cautery up to the clavicle, and that process the Port-A-Cath was encountered it was removed in its entirety and the track of the port was closed with a 3-0 Vicryl pop-off.  Once superior skin flap was formed we then performed the inferior skin flap down to the inframammary fold.  This was taken medially to the sternum and laterally to the latissimus muscle.  The breast was then dissected off the chest wall in a medial to lateral fashion with good hemostasis.  Flaps are viable.  Left side was then performed.  In similar fashion a curvilinear incision was made above and below the nipple-areolar complex.  The area where the tumor was eroding the skin had regressed quite a bit we had a nice negative gross margin around that it appeared.  We had to extend the ellipse more superior to do that.  Once that was done we took the superior skin flap to the clavicle.  The inferior  skin flap was taken down to the inframammary fold.  We then began to dissect the breast off the chest wall in a medial to lateral fashion taking the pectoralis fascia with it.  Once the axilla was encountered I amputated the breast off its lateral attachments.  We then dissected out the left axilla.  All  nodes in the level 1 and up into the level 2 basin were dissected free.  The long thoracic nerve, the thoracodorsal trunk and the axillary vein were preserved.  Small vessels were controlled between clips and cautery.  Cutaneous skin nerves were dissected out and divided between clips and/or cautery going to the skin.  Once the axillary contents were delivered these were passed off the field.  There was some mild adenopathy noted.  On inspection there is no palpable nodes in the level 3 basin.  Subpectoral nodes which were some level 2 nodes were dissected out there were the ones that left were normal in appearance and felt normal.  I given the long thoracic nerve, thoracodorsal trunk and the axillary vein were all inspected and found hemostatic.  Irrigation was used.  At this portion the case plastic surgery scrubbed in with Dr. Marla Roe and took care of the case for reconstruction purposes.  All counts are correct.  Hemostasis was achieved.  Skin flaps appeared viable.  She will dictate the remainder of her portion of the case.  The patient was otherwise stable.

## 2021-03-17 NOTE — Anesthesia Procedure Notes (Signed)
Procedure Name: LMA Insertion Date/Time: 03/17/2021 7:43 AM Performed by: Bufford Spikes, CRNA Pre-anesthesia Checklist: Patient identified, Emergency Drugs available, Suction available and Patient being monitored Patient Re-evaluated:Patient Re-evaluated prior to induction Oxygen Delivery Method: Circle system utilized Preoxygenation: Pre-oxygenation with 100% oxygen Induction Type: IV induction Ventilation: Mask ventilation without difficulty LMA: LMA inserted LMA Size: 3.0 Number of attempts: 1 Placement Confirmation: positive ETCO2 Tube secured with: Tape Dental Injury: Teeth and Oropharynx as per pre-operative assessment

## 2021-03-18 ENCOUNTER — Encounter (HOSPITAL_BASED_OUTPATIENT_CLINIC_OR_DEPARTMENT_OTHER): Payer: Self-pay | Admitting: Surgery

## 2021-03-18 DIAGNOSIS — C50412 Malignant neoplasm of upper-outer quadrant of left female breast: Secondary | ICD-10-CM | POA: Diagnosis not present

## 2021-03-18 MED ORDER — OXYCODONE-ACETAMINOPHEN 5-325 MG PO TABS: 1.0000 | ORAL_TABLET | Freq: Four times a day (QID) | ORAL | 0 refills | Status: DC | PRN

## 2021-03-18 MED ORDER — ONDANSETRON HCL 4 MG PO TABS: 4.0000 mg | ORAL_TABLET | Freq: Three times a day (TID) | ORAL | 0 refills | Status: DC | PRN

## 2021-03-18 MED ORDER — DIAZEPAM 2 MG PO TABS: 2.0000 mg | ORAL_TABLET | Freq: Two times a day (BID) | ORAL | 0 refills | Status: DC | PRN

## 2021-03-18 MED ORDER — CEPHALEXIN 500 MG PO CAPS: 500.0000 mg | ORAL_CAPSULE | Freq: Four times a day (QID) | ORAL | 0 refills | Status: AC

## 2021-03-18 NOTE — Addendum Note (Signed)
Addendum  created 03/18/21 1008 by Maryella Shivers, CRNA   Charge Capture section accepted

## 2021-03-22 ENCOUNTER — Other Ambulatory Visit: Payer: Self-pay | Admitting: Surgical

## 2021-03-22 ENCOUNTER — Telehealth: Payer: Self-pay

## 2021-03-22 MED ORDER — OXYCODONE-ACETAMINOPHEN 7.5-325 MG PO TABS: 1.0000 | ORAL_TABLET | Freq: Three times a day (TID) | ORAL | 0 refills | Status: DC | PRN

## 2021-03-22 NOTE — Telephone Encounter (Signed)
Returned patients call. She is having pain under her left armpit that radiates to her back with along with some swelling. Advised her this is common from the excision of axillary lymph nodes. Patient also mentioned she had a golf ball size bump/ swelling came up this morning on the left breast above the incision and expander. She described the area as "smushy". It is not painful at this site at the bump, but above it.  Per [place], patient to keep observation for any changes size, color, pressure of the bump. At this time she denies any fever, chills, nausea, nor vomiting. Catalina Antigua is sending to her pharmacy 7.5-325 percocet. Advised this may not be approved by her insurance. Patient feels that 600mg  of IBU is giving her some relief. She is keeping her appointment for 03/25/21

## 2021-03-22 NOTE — Telephone Encounter (Signed)
Patient reports left sided burning and stabbing pain at the incision site as well as swelling above the expander location and the armpit which started today.   She is also requesting 10 mg of percocet instead of the 5 mg that was prescribed because she was previously prescribed 10 mg oxycodone by her oncologist and she is not getting much pain relief with the 5 mg.

## 2021-03-22 NOTE — Progress Notes (Signed)
Postop pain control

## 2021-03-24 ENCOUNTER — Ambulatory Visit (INDEPENDENT_AMBULATORY_CARE_PROVIDER_SITE_OTHER): Payer: No Typology Code available for payment source

## 2021-03-24 ENCOUNTER — Other Ambulatory Visit: Payer: Self-pay

## 2021-03-24 DIAGNOSIS — E538 Deficiency of other specified B group vitamins: Secondary | ICD-10-CM

## 2021-03-24 MED ORDER — CYANOCOBALAMIN 1000 MCG/ML IJ SOLN
1000.0000 ug | Freq: Once | INTRAMUSCULAR | Status: AC
  Administered 2021-03-24: 1000 ug via INTRAMUSCULAR

## 2021-03-24 NOTE — Progress Notes (Signed)
Patient presented for B 12 injection to right deltoid, patient voiced no concerns nor showed any signs of distress during injection. 

## 2021-03-25 ENCOUNTER — Ambulatory Visit (INDEPENDENT_AMBULATORY_CARE_PROVIDER_SITE_OTHER): Payer: No Typology Code available for payment source | Admitting: Plastic Surgery

## 2021-03-25 ENCOUNTER — Encounter: Payer: Self-pay | Admitting: Plastic Surgery

## 2021-03-25 ENCOUNTER — Other Ambulatory Visit: Payer: Self-pay

## 2021-03-25 ENCOUNTER — Other Ambulatory Visit: Payer: Self-pay | Admitting: Oncology

## 2021-03-25 DIAGNOSIS — C50412 Malignant neoplasm of upper-outer quadrant of left female breast: Secondary | ICD-10-CM

## 2021-03-25 DIAGNOSIS — Z170 Estrogen receptor positive status [ER+]: Secondary | ICD-10-CM

## 2021-03-25 MED ORDER — OXYCODONE HCL 5 MG PO TABS: ORAL_TABLET | ORAL | 0 refills | Status: DC

## 2021-03-25 NOTE — Progress Notes (Signed)
The patient is a 50 year old female here for follow-up after undergoing bilateral immediate breast reconstruction after mastectomies.  She has [place] expanders in place with 150 cc on both sides.  This was done May 26.  Her drains are in place and seem to be working well.  No sign of a hematoma or seroma.  We placed injectable saline in the Expander using a sterile technique: Right: 50 cc for a total of 200 / 455 cc Left: 50 cc for a total of 200 / 455 cc

## 2021-03-29 ENCOUNTER — Other Ambulatory Visit: Payer: Self-pay

## 2021-03-29 ENCOUNTER — Telehealth: Payer: Self-pay | Admitting: Family

## 2021-03-29 MED ORDER — DIAZEPAM 2 MG PO TABS: 2.0000 mg | ORAL_TABLET | Freq: Two times a day (BID) | ORAL | 1 refills | Status: DC | PRN

## 2021-03-29 NOTE — Telephone Encounter (Signed)
I called and spoke with patient's husband due to patient being asleep. He will let patient know that script was sent.

## 2021-03-29 NOTE — Telephone Encounter (Signed)
RX Refill:valium Last Seen:03-14-21 Last ordered:03-18-21

## 2021-03-29 NOTE — Telephone Encounter (Signed)
Call pt We re'd refill request for valium  She is also on lorazepam   I do not want her on both   Is she taking the lorazepam?

## 2021-03-29 NOTE — Telephone Encounter (Signed)
Patient stated that after surgery she was instructed not to take the Ativan & is taking the Valium.

## 2021-03-29 NOTE — Telephone Encounter (Signed)
Call pt Okay I dc'ed ativan  I refilled valium  I looked up patient on [place] Controlled Substances Reporting System PMP AWARE and saw no activity that raised concern of inappropriate use.

## 2021-03-31 ENCOUNTER — Other Ambulatory Visit: Payer: Self-pay

## 2021-03-31 ENCOUNTER — Ambulatory Visit (INDEPENDENT_AMBULATORY_CARE_PROVIDER_SITE_OTHER): Payer: No Typology Code available for payment source

## 2021-03-31 DIAGNOSIS — E538 Deficiency of other specified B group vitamins: Secondary | ICD-10-CM

## 2021-03-31 MED ORDER — CYANOCOBALAMIN 1000 MCG/ML IJ SOLN
1000.0000 ug | Freq: Once | INTRAMUSCULAR | Status: AC
  Administered 2021-03-31: 1000 ug via INTRAMUSCULAR

## 2021-03-31 NOTE — Progress Notes (Addendum)
Patient presented for B 12 injection to right deltoid, patient voiced no concerns nor showed any signs of distress during injection.  Agree Dr. Olivia Mackie McLean-Scocuzza

## 2021-04-05 ENCOUNTER — Encounter: Payer: Self-pay | Admitting: Surgical

## 2021-04-05 ENCOUNTER — Telehealth: Payer: Self-pay

## 2021-04-05 ENCOUNTER — Other Ambulatory Visit: Payer: Self-pay

## 2021-04-05 ENCOUNTER — Ambulatory Visit (INDEPENDENT_AMBULATORY_CARE_PROVIDER_SITE_OTHER): Payer: No Typology Code available for payment source | Admitting: Surgical

## 2021-04-05 DIAGNOSIS — Z170 Estrogen receptor positive status [ER+]: Secondary | ICD-10-CM

## 2021-04-05 DIAGNOSIS — C50412 Malignant neoplasm of upper-outer quadrant of left female breast: Secondary | ICD-10-CM

## 2021-04-05 NOTE — Telephone Encounter (Signed)
Left message with patient to call back regarding CARE program.

## 2021-04-05 NOTE — Discharge Summary (Signed)
Physician Discharge Summary  Patient ID: Tricia Berry MRN: [id number] DOB/AGE: Mar 08, 1971 50 y.o.  Admit date: 03/17/2021 Discharge date: 04/05/2021  Admission Diagnoses:  Discharge Diagnoses:  Active Problems:   Breast cancer [facility])   Discharged Condition: good  Hospital Course: 50 year old female presented to the [facility] surgical center OR for left modified radical mastectomy with right simple mastectomy risk reducing and port removal by [name] followed by bilateral immediate breast reconstruction with placement of tissue expander and Flex HD on 03/17/2021.  Patient is doing well postoperatively this AM.  No acute overnight events  Patient stable for discharge  Consults: None  Significant Diagnostic Studies: None  Treatments: antibiotics: Ancef  Discharge Exam: Blood pressure 111/88, pulse 68, temperature 99 F (37.2 C), resp. rate 16, height 5' (1.524 m), weight 56.4 kg, SpO2 95 %. General appearance: alert, cooperative, and no distress Resp: Unlabored Breasts: Dressings intact.  Incisions intact.  No necrosis noted.  No large fluid collections noted with palpation.  JP drains in place. Extremities: extremities normal, atraumatic, no cyanosis or edema Pulses: 2+ and symmetric  Disposition: Discharge disposition: 01-Home or Self Care       Discharge Instructions     Call MD for:  difficulty breathing, headache or visual disturbances   Complete by: As directed    Call MD for:  extreme fatigue   Complete by: As directed    Call MD for:  hives   Complete by: As directed    Call MD for:  persistant dizziness or light-headedness   Complete by: As directed    Call MD for:  persistant nausea and vomiting   Complete by: As directed    Call MD for:  redness, tenderness, or signs of infection (pain, swelling, redness, odor or green/yellow discharge around incision site)   Complete by: As directed    Call MD for:  severe uncontrolled pain   Complete by: As  directed    Call MD for:  temperature >100.4   Complete by: As directed    Diet - low sodium heart healthy   Complete by: As directed    Increase activity slowly   Complete by: As directed       Allergies as of 03/18/2021       Reactions   Morphine Nausea And Vomiting, Other (See Comments)   migranes   Sertraline Hcl    REACTION: Worsened symptoms of IBS   Sulfamethoxazole Rash   Sulfonamide Derivatives Rash        Medication List     TAKE these medications    DULoxetine 60 MG capsule Commonly known as: Cymbalta Take 1 capsule (60 mg total) by mouth daily.   MULTIVITAL PO Take 1 Dose by mouth daily.   nicotine 21 mg/24hr patch Commonly known as: NICODERM CQ - dosed in mg/24 hours PLACE 1 PATCH (21 MG TOTAL) ONTO THE SKIN DAILY.   Nicotrol 10 MG inhaler Generic drug: nicotine FOLLOW INSTRUCTIONS ON PACKAGE AS NEEDED FOR SMOKING CESSATION   ondansetron 4 MG tablet Commonly known as: Zofran Take 1 tablet (4 mg total) by mouth every 8 (eight) hours as needed for nausea or vomiting.   PROBIOTIC ACIDOPHILUS PO Take 1 capsule by mouth daily.   SM Nicotine Polacrilex 4 MG lozenge Generic drug: nicotine polacrilex take as directed on package   traZODone 50 MG tablet Commonly known as: DESYREL Take 0.5-1 tablets (25-50 mg total) by mouth at bedtime as needed for sleep.  ASK your doctor about these medications    cephALEXin 500 MG capsule Commonly known as: KEFLEX Take 1 capsule (500 mg total) by mouth 4 (four) times daily for 5 days. Ask about: Should I take this medication?        Follow-up Information     Dillingham, [name], DO In 10 days.   Specialty: Plastic Surgery Contact information: [address] [place] [address] [phone]         Erroll Luna, MD In 2 weeks.   Specialty: General Surgery Contact information: [address] [place] [address] [phone]                 [facility] [address] [place], [place] [address] [phone]  Signed: Carola Rhine Maidie Streight 04/05/2021, 3:54 PM

## 2021-04-05 NOTE — Progress Notes (Addendum)
Patient is a 50 year old female here for follow-up after undergoing bilateral immediate breast reconstruction after mastectomies.  She has expanders in place.  Just shy of 3 weeks postop.  JP drain output has been less than 10 cc per 24 hours bilaterally  Chaperone present on exam Bilateral breast incisions appear intact.  Steri-Strips are still in place.  No erythema noted.  No subcutaneous fluid collections noted.  Minimal tenderness with palpation.   We placed injectable saline in the Expander using a sterile technique: Right: 50 cc for a total of 250 / 455 cc Left: 50 cc for a total of 250 / 455 cc  Continue to avoid strenuous activities.  Avoid heavy lifting.  No sign of infection, seroma, hematoma. Patient can return to care exercise program, no lifting greater than 10 pounds. Follow-up in 2 weeks for additional fill

## 2021-04-05 NOTE — Addendum Note (Signed)
Addended by: Harl Bowie on: 04/05/2021 03:32 PM   Modules accepted: Orders

## 2021-04-06 ENCOUNTER — Encounter: Payer: Self-pay | Admitting: *Deleted

## 2021-04-06 ENCOUNTER — Other Ambulatory Visit: Payer: Self-pay | Admitting: *Deleted

## 2021-04-06 NOTE — Patient Outreach (Addendum)
[facility]) Care Management  04/06/2021  Naquita Nappier [place] June 07, 1971 [id number]   Transition of care telephone call/case closure   Referral received:04/05/21 Initial outreach:04/06/21 Insurance: [employer]   Initial unsuccessful telephone call to patient's preferred number in order to complete transition of care assessment; no answer, left HIPAA compliant voicemail message requesting return call.   Objective: Per the electronic medical record, Shamaine Mulkern  was hospitalized at [facility] surgery center 5/26-5/27 for  Left modified radical mastectomy with right simple mastectomy, breast reconstruction with placement of tissue expander . 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   Return call from patient   Subjective: Initial successful telephone call to patient's preferred number in order to complete transition of care assessment; 2 HIPAA identifiers verified. Explained purpose of call and completed transition of care assessment.  Jaquaya states she is being to feel better, she discussed having drains removed for surgical area and bandage off stating that is relieving. She denies post-operative problems, says surgical incisions are unremarkable, states surgical pain well managed with prescribed medications. She reports tolerating activity in the home and to begin care exercise exercise program and lymphedema management. She is tolerating diet, appetite is improving denies bowel or bladder problems.  Spouse/children are assisting with her recovery.   Reviewed accessing the following [place] Benefits : She denies any ongoing health issues and says she does not need a referral to one of the [place] chronic disease management programs.  Discussed and offered  employee assistance counseling program contact number.  Patient states she is on disability long term at this time  She uses a [facility] outpatient pharmacy at [place].       Assessment:  Patient voices good understanding of all discharge instructions.  See transition of care flowsheet for assessment details.   Plan:  Reviewed hospital discharge diagnosis of Bilateral mastectomy and breat reconstruction surgery discharge treatment plan using hospital discharge instructions, assessing medication adherence, reviewing problems requiring provider notification, and discussing the importance of follow up with surgeon, primary care provider and/or specialists as directed.  Reviewed [place] healthy lifestyle program information to receive discounted premium for  2023   Step 1: Get  your annual physical  Step 2: Complete your health assessment  Step 3:Identify your current health status and complete the corresponding action step between October 23, 2020 and June 23, 2021.     No ongoing care management needs identified so will close case to [place] Management services and route successful outreach letter with [place] Management pamphlet and 24 Hour Nurse Line Magnet to [place] Management clinical pool to be mailed to patient's home address.  Thanked patient for their services to [facility].    Joylene Draft, RN, BSN  [place] Management Coordinator  [phone]- Mobile [phone]- Toll Free Main Office

## 2021-04-07 ENCOUNTER — Telehealth: Payer: Self-pay

## 2021-04-07 ENCOUNTER — Other Ambulatory Visit: Payer: Self-pay

## 2021-04-07 ENCOUNTER — Ambulatory Visit: Payer: No Typology Code available for payment source | Attending: Surgery | Admitting: Physical Therapy

## 2021-04-07 ENCOUNTER — Ambulatory Visit (INDEPENDENT_AMBULATORY_CARE_PROVIDER_SITE_OTHER): Payer: No Typology Code available for payment source

## 2021-04-07 ENCOUNTER — Telehealth: Payer: Self-pay | Admitting: *Deleted

## 2021-04-07 ENCOUNTER — Encounter: Payer: No Typology Code available for payment source | Attending: Family

## 2021-04-07 ENCOUNTER — Encounter: Payer: Self-pay | Admitting: Physical Therapy

## 2021-04-07 DIAGNOSIS — C50412 Malignant neoplasm of upper-outer quadrant of left female breast: Secondary | ICD-10-CM | POA: Insufficient documentation

## 2021-04-07 DIAGNOSIS — C50912 Malignant neoplasm of unspecified site of left female breast: Secondary | ICD-10-CM | POA: Diagnosis present

## 2021-04-07 DIAGNOSIS — M25611 Stiffness of right shoulder, not elsewhere classified: Secondary | ICD-10-CM | POA: Insufficient documentation

## 2021-04-07 DIAGNOSIS — Z483 Aftercare following surgery for neoplasm: Secondary | ICD-10-CM | POA: Diagnosis present

## 2021-04-07 DIAGNOSIS — Z170 Estrogen receptor positive status [ER+]: Secondary | ICD-10-CM | POA: Diagnosis present

## 2021-04-07 DIAGNOSIS — M25612 Stiffness of left shoulder, not elsewhere classified: Secondary | ICD-10-CM | POA: Diagnosis present

## 2021-04-07 DIAGNOSIS — E538 Deficiency of other specified B group vitamins: Secondary | ICD-10-CM

## 2021-04-07 DIAGNOSIS — R293 Abnormal posture: Secondary | ICD-10-CM | POA: Insufficient documentation

## 2021-04-07 MED ORDER — CYANOCOBALAMIN 1000 MCG/ML IJ SOLN
1000.0000 ug | Freq: Once | INTRAMUSCULAR | Status: AC
  Administered 2021-04-07: 1000 ug via INTRAMUSCULAR

## 2021-04-07 NOTE — Telephone Encounter (Addendum)
Incoming call from [place] with patient PCP stating that she has some STD forms to complete and is unsure how to answer some of hte questions and is asking for either help completing it or for [place] to complete the form. Please return her call [phone]

## 2021-04-07 NOTE — Patient Instructions (Signed)
Shoulder: Flexion (Supine)    With hands shoulder width apart, slowly lower dowel to floor behind head. Do not let elbows bend. Keep back flat. Hold _5-15___ seconds. Repeat _10___ times. Do __2__ sessions per day. CAUTION: Stretch slowly and gently.  Copyright  VHI. All rights reserved.  Shoulder: Abduction (Supine)    With left arm flat on floor, hold dowel in palm. Slowly move arm up to side of head by pushing with opposite arm. Do not let elbow bend. Hold _5-15___ seconds. Repeat _10___ times. Do _2___ sessions per day. CAUTION: Stretch slowly and gently.  Copyright  VHI. All rights reserved.

## 2021-04-07 NOTE — Therapy (Signed)
[place], [place], [address] Phone: [phone]   Fax:  [phone]  Physical Therapy Treatment  Patient Details  Name: Tricia Berry MRN: [id number] Date of Birth: 1971-01-16 Referring Provider (PT): Cornett   Encounter Date: 04/07/2021   PT End of Session - 04/07/21 1344     Visit Number 2    Number of Visits 10    Date for PT Re-Evaluation 05/05/21    PT Start Time 1304    PT Stop Time 1340    PT Time Calculation (min) 36 min    Activity Tolerance Patient tolerated treatment well    Behavior During Therapy [facility] for tasks assessed/performed             Past Medical History:  Diagnosis Date   Anxiety    Breast cancer ([place])    Diverticulitis    Family history of ovarian cancer    GERD (gastroesophageal reflux disease)    IBS (irritable bowel syndrome)    PONV (postoperative nausea and vomiting)    severe migraine and vomiting post anesthesia   Scoliosis     Past Surgical History:  Procedure Laterality Date   ABDOMINAL HYSTERECTOMY     still has ovaries, no gyn cancer, hysterectomy due to endometriosis. NO cervix on exam 01/10/21   BREAST BIOPSY Left 09/15/2020   [place] bx of mass, path pending, Q marker   BREAST BIOPSY Left 09/15/2020   [place] bx of LN, hydromarker, path pending   BREAST RECONSTRUCTION WITH PLACEMENT OF TISSUE EXPANDER AND FLEX HD (ACELLULAR HYDRATED DERMIS) Bilateral 03/17/2021   Procedure: IMMEDIATE BILATERAL BREAST RECONSTRUCTION WITH PLACEMENT OF TISSUE EXPANDER AND FLEX HD (ACELLULAR HYDRATED DERMIS);  Surgeon: Wallace Going, DO;  Location: [place];  Service: Plastics;  Laterality: Bilateral;   IR IMAGING GUIDED PORT INSERTION  10/01/2020   MODIFIED MASTECTOMY Left 03/17/2021   Procedure: LEFT MODIFIED RADICAL MASTECTOMY;  Surgeon: Erroll Luna, MD;  Location: [place];  Service: General;  Laterality: Left;   PORTA CATH  REMOVAL Right 03/17/2021   Procedure: PORTA CATH REMOVAL;  Surgeon: Erroll Luna, MD;  Location: [place];  Service: General;  Laterality: Right;   TOTAL MASTECTOMY Right 03/17/2021   Procedure: TOTAL MASTECTOMY;  Surgeon: Erroll Luna, MD;  Location: [place];  Service: General;  Laterality: Right;    There were no vitals filed for this visit.   Subjective Assessment - 04/07/21 1306     Subjective I have a lot of burning in my left arm and I have a lot of swelling in my left arm and under my armpit. I just had my drains removed on Tuesday. The right side is much better.    Pertinent History L breast cancer ER+/PR+ HER2-, stage III, will undergo a bilateral mastectomy and ALND on L (7/16) on 03/17/21, neuropathy of hands, lower legs and feet    Patient Stated Goals to gain info from providers    Currently in Pain? Yes    Pain Score 5     Pain Location Chest    Pain Orientation Left    Pain Descriptors / Indicators Tightness    Pain Type Surgical pain    Pain Onset 1 to 4 weeks ago    Pain Frequency Intermittent    Aggravating Factors  nothing - sometimes positioning    Pain Relieving Factors muscle relaxer    Effect of Pain on Daily Activities limits daily acitivites,  unable to do when in pain    Multiple Pain Sites Yes    Pain Score 7    Pain Location Arm    Pain Orientation Left;Upper    Pain Descriptors / Indicators Burning    Pain Type Surgical pain    Pain Onset 1 to 4 weeks ago    Pain Frequency Constant    Aggravating Factors  moving arm    Pain Relieving Factors lying arm over cooling pillowcase    Effect of Pain on Daily Activities can't reach over head, can't extend arm                [facility] PT Assessment - 04/07/21 0001       Observation/Other Assessments   Observations fullness just inferior to left axilla and on chest just in front of axilla      AROM   Right Shoulder Extension 41 Degrees    Right Shoulder Flexion 143  Degrees    Right Shoulder ABduction 152 Degrees    Right Shoulder Internal Rotation 55 Degrees    Right Shoulder External Rotation 101 Degrees    Left Shoulder Extension 46 Degrees    Left Shoulder Flexion 101 Degrees    Left Shoulder ABduction 78 Degrees    Left Shoulder Internal Rotation 65 Degrees    Left Shoulder External Rotation 90 Degrees               LYMPHEDEMA/ONCOLOGY QUESTIONNAIRE - 04/07/21 0001       Left Upper Extremity Lymphedema   15 cm Proximal to Olecranon Process 25.5 cm    Olecranon Process 22.5 cm    15 cm Proximal to Ulnar Styloid Process 21.5 cm    Just Proximal to Ulnar Styloid Process 14.8 cm    Across Hand at [employer] 17.7 cm    At [place] of 2nd Digit 5.7 cm                        [facility] Adult PT Treatment/Exercise - 04/07/21 0001       Manual Therapy   Manual Therapy Edema management    Edema Management created foam chip pack for pt to wear in L axilla                         PT Long Term Goals - 04/07/21 1357       PT LONG TERM GOAL #1   Title Pt will return to baseline shoulder ROM measurements and not demonstrate any signs or symptoms of lymphedema.    Time 4    Period Weeks    Status On-going      PT LONG TERM GOAL #2   Title Pt will demonstrate 165 degrees of bilateral flexion to allow pt to reach overhead.    Baseline R 143 L 101    Time 4    Period Weeks    Status New    Target Date 05/05/21      PT LONG TERM GOAL #3   Title Pt will demonstrate 165 degrees of bilateral shoulder abduction to allow her to reach out the side    Baseline R 152 L 78    Time 4    Period Weeks    Status New    Target Date 05/05/21      PT LONG TERM GOAL #4   Title Pt will report she is no longer having muscle spasms across her  chest to allow improved comfort    Time 4    Period Weeks    Status New    Target Date 05/05/21      PT LONG TERM GOAL #5   Title Pt will be independent in a home exercise program  for long term strengthening and stretching.    Time 4    Period Weeks    Status New    Target Date 05/05/21      Additional Long Term Goals   Additional Long Term Goals Yes      PT LONG TERM GOAL #6   Title Pt will report a 50% improvement in edema in L trunk and chest to allow improved comfort.    Time 4    Period Weeks    Status New    Target Date 05/05/21                   Plan - 04/07/21 1344     Clinical Impression Statement Pt returns to PT after undergoing bilateral mastectomies and ALND (7/16) on 03/17/21. She just had her last drained removed on Tuesday. She has decreased bilateral shoulder ROM with L worse than R. She reports burning pain in L arm down to elbow. She has increased swelling in left lateral trunk just inferior to axilla and in chest just anterior to axilla. Pt most likely has cording that is not palpable today because she reports burning tightness down arm when she raises her arm. Educated pt to not lift arm above shoulder height until at next Wednesday since her last drain was just removed. Pt would benefit from skilled PT services to improve bialteral shoulder ROM, decrease swelling and decrease muscle spasms across chest.    Examination-Activity Limitations Reach Overhead;Carry;Lift;Dressing    Examination-Participation Restrictions Driving;Occupation;Yard Work;Meal Prep;Community Activity;Cleaning;Laundry;Shop    Rehab Potential Good    PT Frequency 2x / week    PT Duration 4 weeks    PT Treatment/Interventions ADLs/Self Care Home Management;Therapeutic exercise;Patient/family education;Scar mobilization;Passive range of motion;Manual techniques;Manual lymph drainage;Compression bandaging;Taping;Vasopneumatic Device    PT Next Visit Plan begin PROM to bilateral shoulders (L worse than R) after Tuesday, MLD to L lateral trunk/axilla/anterior chest    PT Home Exercise Plan post op breast exercises, instructed pt in supine dowel to begin on Wed     Consulted and Agree with Plan of Care Patient             Patient will benefit from skilled therapeutic intervention in order to improve the following deficits and impairments:  Pain, Postural dysfunction, Decreased knowledge of precautions, Impaired UE functional use, Increased fascial restricitons, Decreased strength, Decreased range of motion, Decreased scar mobility, Increased edema, Increased muscle spasms  Visit Diagnosis: Stiffness of left shoulder, not elsewhere classified  Stiffness of right shoulder, not elsewhere classified  Aftercare following surgery for neoplasm  Abnormal posture  Malignant 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, estrogen receptor positive, unspecified site of breast [facility])     Problem List Patient Active Problem List   Diagnosis Date Noted   Breast cancer ([place]) 03/17/2021   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/14/2021   Genetic testing 11/22/2020   Family history of ovarian cancer    Candidal vulvovaginitis 11/01/2020   Low back pain 10/06/2020   Encounter for medical examination to establish care 09/26/2020   Malignant neoplasm of upper-outer quadrant of left breast in female, estrogen receptor positive ([place]) 09/26/2020   BACK STRAIN, LUMBAR 03/08/2010   INTERNAL HEMORRHOIDS 01/27/2010  ANAL FISSURE 01/27/2010   OSTEOARTHRITIS, HANDS, BILATERAL 01/04/2010   ARTHRALGIA 10/05/2009   INSOMNIA, CHRONIC 05/06/2009   ALLERGIC RHINITIS, SEASONAL 02/09/2009   TOBACCO ABUSE 12/29/2008   IBS 10/13/2008   Anxiety state 08/25/2008   DEPRESSION, RECURRENT 08/25/2008    Allyson Sabal [facility] 04/07/2021, 1:59 PM  [place], [place], [address] Phone: [phone]   Fax:  [phone]  Name: Tricia Berry MRN: [id number] Date of Birth: [birth year]  Manus Gunning, PT 04/07/21 1:59 PM

## 2021-04-07 NOTE — Telephone Encounter (Signed)
LMTCB at Dr. Elroy Channel office to see if they will fill out paperwork for [facility] due to her following there for breast cancer.

## 2021-04-07 NOTE — Progress Notes (Signed)
Patient presented for B 12 injection to right deltoid, patient voiced no concerns nor showed any signs of distress during injection. 

## 2021-04-11 ENCOUNTER — Encounter: Payer: Self-pay | Admitting: Oncology

## 2021-04-11 ENCOUNTER — Inpatient Hospital Stay: Payer: No Typology Code available for payment source | Attending: Oncology

## 2021-04-11 ENCOUNTER — Inpatient Hospital Stay (HOSPITAL_BASED_OUTPATIENT_CLINIC_OR_DEPARTMENT_OTHER): Payer: No Typology Code available for payment source | Admitting: Oncology

## 2021-04-11 VITALS — BP 128/91 | HR 101 | Temp 98.6°F | Resp 16 | Ht 60.0 in | Wt 118.6 lb

## 2021-04-11 DIAGNOSIS — F17210 Nicotine dependence, cigarettes, uncomplicated: Secondary | ICD-10-CM | POA: Insufficient documentation

## 2021-04-11 DIAGNOSIS — Z9013 Acquired absence of bilateral breasts and nipples: Secondary | ICD-10-CM | POA: Insufficient documentation

## 2021-04-11 DIAGNOSIS — Z170 Estrogen receptor positive status [ER+]: Secondary | ICD-10-CM | POA: Insufficient documentation

## 2021-04-11 DIAGNOSIS — C50412 Malignant neoplasm of upper-outer quadrant of left female breast: Secondary | ICD-10-CM

## 2021-04-11 DIAGNOSIS — Z7189 Other specified counseling: Secondary | ICD-10-CM

## 2021-04-11 DIAGNOSIS — Z90710 Acquired absence of both cervix and uterus: Secondary | ICD-10-CM | POA: Insufficient documentation

## 2021-04-11 DIAGNOSIS — Z8041 Family history of malignant neoplasm of ovary: Secondary | ICD-10-CM | POA: Insufficient documentation

## 2021-04-11 LAB — CBC WITH DIFFERENTIAL/PLATELET
Abs Immature Granulocytes: 0.01 10*3/uL (ref 0.00–0.07)
Basophils Absolute: 0.1 10*3/uL (ref 0.0–0.1)
Basophils Relative: 1 %
Eosinophils Absolute: 0.2 10*3/uL (ref 0.0–0.5)
Eosinophils Relative: 2 %
HCT: 43.2 % (ref 36.0–46.0)
Hemoglobin: 14.2 g/dL (ref 12.0–15.0)
Immature Granulocytes: 0 %
Lymphocytes Relative: 32 %
Lymphs Abs: 2.7 10*3/uL (ref 0.7–4.0)
MCH: 30.2 pg (ref 26.0–34.0)
MCHC: 32.9 g/dL (ref 30.0–36.0)
MCV: 91.9 fL (ref 80.0–100.0)
Monocytes Absolute: 0.8 10*3/uL (ref 0.1–1.0)
Monocytes Relative: 9 %
Neutro Abs: 4.7 10*3/uL (ref 1.7–7.7)
Neutrophils Relative %: 56 %
Platelets: 301 10*3/uL (ref 150–400)
RBC: 4.7 MIL/uL (ref 3.87–5.11)
RDW: 13.9 % (ref 11.5–15.5)
WBC: 8.5 10*3/uL (ref 4.0–10.5)
nRBC: 0 % (ref 0.0–0.2)

## 2021-04-11 LAB — COMPREHENSIVE METABOLIC PANEL
ALT: 14 U/L (ref 0–44)
AST: 22 U/L (ref 15–41)
Albumin: 3.8 g/dL (ref 3.5–5.0)
Alkaline Phosphatase: 84 U/L (ref 38–126)
Anion gap: 11 (ref 5–15)
BUN: 17 mg/dL (ref 6–20)
CO2: 29 mmol/L (ref 22–32)
Calcium: 9.5 mg/dL (ref 8.9–10.3)
Chloride: 98 mmol/L (ref 98–111)
Creatinine, Ser: 0.72 mg/dL (ref 0.44–1.00)
GFR, Estimated: >60 mL/min (ref >60)
Glucose, Bld: 115 mg/dL — ABNORMAL HIGH (ref 70–99)
Potassium: 3.9 mmol/L (ref 3.5–5.1)
Sodium: 138 mmol/L (ref 135–145)
Total Bilirubin: 0.3 mg/dL (ref 0.3–1.2)
Total Protein: 7 g/dL (ref 6.5–8.1)

## 2021-04-11 NOTE — Progress Notes (Signed)
DISCONTINUE ON PATHWAY REGIMEN - Breast     Cycles 1 through 4: A cycle is every 14 days:     Doxorubicin      Cyclophosphamide      Pegfilgrastim-xxxx    Cycles 5 through 16: A cycle is every 7 days:     Paclitaxel   **Always confirm dose/schedule in your pharmacy ordering system**  REASON: Other Reason PRIOR TREATMENT: [id number]: Dose-Dense AC-T (Paclitaxel Weekly) - [Doxorubicin + Cyclophosphamide q14 Days x 4 Cycles, Followed by Paclitaxel 80 mg/m2 Weekly x 12 Weeks] TREATMENT RESPONSE: Partial Response (PR)  START OFF PATHWAY REGIMEN - Other   [id number]:Pertuzumab 420 mg IV + Trastuzumab 6 mg/kg IV q21 Days:   A cycle is every 21 days:     Trastuzumab-xxxx      Pertuzumab   **Always confirm dose/schedule in your pharmacy ordering system**  Patient Characteristics: Intent of Therapy: Curative Intent, Discussed with Patient

## 2021-04-11 NOTE — Telephone Encounter (Signed)
I have faxed.

## 2021-04-11 NOTE — Progress Notes (Addendum)
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  She then noticed that her lump is gradually getting larger with an area of ulceration on the skin and underwent a diagnostic bilateral mammogram on 09/08/2020 which showed hypoechoic interconnected masses in the left breast from 10:00 to 2 o'clock position spanning at least an area of 4.8 cm.  Ultrasound also showed 5 abnormal lymph nodes in the axilla with cortical thickening.  Both the mass and the lymph nodes were biopsied and was consistent with invasive mammary carcinoma grade 2.  Tumor was ER +91- 100%, PR +11 to 20% and HER-2 negative.  Ki-67 15%. Patient will be meeting [name] from [facility] surgery to discuss surgical management.  She is here for medical oncology recommendations.  In terms of her general health patient is otherwise doing well and does not have significant comorbidities.  She does endorse significant pain in the area of her left breast.  Tylenol and Motrin has not been helping her with this pain.  She lives with her husband and 2 children who have special needs.  No prior history of abnormal breast mammograms or breast biopsies.   PET CT scan showed hypermetabolism in the area of the left breast  but no evidence of hypermetabolism in the left axilla Or evidence of distant metastatic disease.   Neoadjuvant dose dense ACT chemotherapy started on 10/08/2020. Interim ultrasound after 4 cycles of dose dense AC chemotherapy showed overall decrease in volume of the tumor.  Nodularity previously seen on ultrasound was also decreased in size.   Patient completed neoadjuvant AC Taxol chemotherapy and underwent bilateral mastectomy with reconstruction.Final pathology showed fibrocystic changes in the right breast with no malignancy.  3.6 cm invasive mammary carcinoma in the left breast.  7 out of 16 lymph nodes positive for malignancy.  Treatment effect in the breast present but not robust.  Treatment effect in the lymph nodes minimal.  Margins negative.  Overall grade 2.  Extranodal extension present.  ER greater than 90% positive, PR 15% positive.  HER2 +2 equivocal and positive by FISH  Interval history-patient is healing well since her surgery.  Denies any specific complaints at this time.  She does have some baseline fatigue as well as neuropathy from prior chemo which has remained stable  ECOG PS- 1 Pain scale- 0   Review of systems- Review of Systems  Constitutional:  Positive for malaise/fatigue. Negative for chills, fever and weight loss.  HENT:  Negative for congestion, ear discharge and nosebleeds.   Eyes:  Negative for blurred vision.  Respiratory:  Negative for cough, hemoptysis, sputum production, shortness of breath and wheezing.   Cardiovascular:  Negative for chest pain, palpitations, orthopnea and claudication.  Gastrointestinal:  Negative for abdominal pain, blood in stool, constipation, diarrhea, heartburn, melena,  nausea and vomiting.  Genitourinary:  Negative for dysuria, flank pain, frequency, hematuria and urgency.  Musculoskeletal:  Negative for back pain, joint pain and myalgias.  Skin:  Negative for rash.  Neurological:  Positive for sensory change (Peripheral  neuropathy). Negative for dizziness, tingling, focal weakness, seizures, weakness and headaches.  Endo/Heme/Allergies:  Does not bruise/bleed easily.  Psychiatric/Behavioral:  Negative for depression and suicidal ideas. The patient does not have insomnia.     Allergies  Allergen Reactions   Morphine Nausea And Vomiting and Other (See Comments)    migranes   Sertraline Hcl     REACTION: Worsened symptoms of IBS   Sulfamethoxazole Rash   Sulfonamide Derivatives Rash     Past Medical History:  Diagnosis Date   Anxiety    Breast cancer ([place])    Diverticulitis    Family history of ovarian cancer    GERD (gastroesophageal reflux disease)    IBS (irritable bowel syndrome)    PONV (postoperative nausea and vomiting)    severe migraine and vomiting post anesthesia   Scoliosis      Past Surgical History:  Procedure Laterality Date   ABDOMINAL HYSTERECTOMY     still has ovaries, no gyn cancer, hysterectomy due to endometriosis. NO cervix on exam 01/10/21   BREAST BIOPSY Left 09/15/2020   [place] bx of mass, path pending, Q marker   BREAST BIOPSY Left 09/15/2020   [place] bx of LN, hydromarker, path pending   BREAST RECONSTRUCTION WITH PLACEMENT OF TISSUE EXPANDER AND FLEX HD (ACELLULAR HYDRATED DERMIS) Bilateral 03/17/2021   Procedure: IMMEDIATE BILATERAL BREAST RECONSTRUCTION WITH PLACEMENT OF TISSUE EXPANDER AND FLEX HD (ACELLULAR HYDRATED DERMIS);  Surgeon: Wallace Going, DO;  Location: [facility];  Service: Plastics;  Laterality: Bilateral;   IR IMAGING GUIDED PORT INSERTION  10/01/2020   MODIFIED MASTECTOMY Left 03/17/2021   Procedure: LEFT MODIFIED RADICAL MASTECTOMY;  Surgeon: Erroll Luna, MD;  Location: [place];  Service: General;  Laterality: Left;   PORTA CATH REMOVAL Right 03/17/2021   Procedure: PORTA CATH REMOVAL;  Surgeon: Erroll Luna, MD;  Location: [place];  Service: General;  Laterality: Right;   TOTAL MASTECTOMY  Right 03/17/2021   Procedure: TOTAL MASTECTOMY;  Surgeon: Erroll Luna, MD;  Location: [place];  Service: General;  Laterality: Right;    Social History   Socioeconomic History   Marital status: Divorced    Spouse name: Not on file   Number of children: 2   Years of education: Not on file   Highest education level: Not on file  Occupational History   Occupation: Nurse    Employer: [employer]  Tobacco Use   Smoking status: Every Day    Packs/day: 0.25    Pack years: 0.00    Types: Cigarettes   Smokeless tobacco: Never  Vaping Use   Vaping Use: Never used  Substance and Sexual Activity   Alcohol use: Not Currently   Drug use: No   Sexual activity: Not Currently    Birth control/protection: None  Other Topics Concern   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 She and her spouse are primary caregivers.    Social Determinants of Health   Financial Resource Strain: Not on file  Food Insecurity: Not on file  Transportation Needs: Not on file  Physical Activity: Not on file  Stress: Not  on file  Social Connections: Not on file  Intimate Partner Violence: Not on file    Family History  Problem Relation Age of Onset   Hypertension Mother    Osteoarthritis Mother    Diverticulitis Mother    Heart failure Father    Hypertension Father    Gout Father    Diverticulitis Brother    Ovarian cancer Paternal Grandmother      Current Outpatient Medications:    diazepam (VALIUM) 2 MG tablet, Take 1 tablet (2 mg total) by mouth every 12 (twelve) hours as needed for muscle spasms., Disp: 20 tablet, Rfl: 1   DULoxetine (CYMBALTA) 60 MG capsule, Take 1 capsule (60 mg total) by mouth daily., Disp: 90 capsule, Rfl: 3   Lactobacillus (PROBIOTIC ACIDOPHILUS PO), Take 1 capsule by mouth daily., Disp: , Rfl:    Multiple Vitamins-Minerals (MULTIVITAL PO), Take 1 Dose by mouth  daily., Disp: , Rfl:    nicotine (NICODERM CQ - DOSED IN MG/24 HOURS) 21 mg/24hr patch, PLACE 1 PATCH (21 MG TOTAL) ONTO THE SKIN DAILY., Disp: 28 patch, Rfl: 0   nicotine (NICOTROL) 10 MG inhaler, FOLLOW INSTRUCTIONS ON PACKAGE AS NEEDED FOR SMOKING CESSATION, Disp: 168 each, Rfl: 0   nicotine polacrilex (SM NICOTINE POLACRILEX) 4 MG lozenge, take as directed on package, Disp: 72 lozenge, Rfl: 0   ondansetron (ZOFRAN) 4 MG tablet, Take 1 tablet (4 mg total) by mouth every 8 (eight) hours as needed for nausea or vomiting., Disp: 20 tablet, Rfl: 0   oxyCODONE (OXY IR/ROXICODONE) 5 MG immediate release tablet, 1-2 tablets every 6 hours as needed for pain, Disp: 240 tablet, Rfl: 0   traZODone (DESYREL) 50 MG tablet, Take 0.5-1 tablets (25-50 mg total) by mouth at bedtime as needed for sleep. (Patient not taking: Reported on 04/11/2021), Disp: 30 tablet, Rfl: 3 No current facility-administered medications for this visit.  Facility-Administered Medications Ordered in Other Visits:    heparin lock flush 100 unit/mL, 500 Units, Intravenous, Once, Sindy Guadeloupe, MD  Physical exam:  Vitals:   04/11/21 1052  BP: (!) 128/91  Pulse: (!) 101  Resp: 16  Temp: 98.6 F (37 C)  TempSrc: Oral  Weight: 118 lb 9.6 oz (53.8 kg)  Height: 5' (1.524 m)   Physical Exam Constitutional:      General: She is not in acute distress. Cardiovascular:     Rate and Rhythm: Normal rate and regular rhythm.     Heart sounds: Normal heart sounds.  Pulmonary:     Effort: Pulmonary effort is normal.  Skin:    General: Skin is warm and dry.  Neurological:     Mental Status: She is alert and oriented to person, place, and time.  Patient is s/p bilateral mastectomy with reconstruction.  Surgical scar appears to be healing well  CMP Latest Ref Rng & Units 04/11/2021  Glucose 70 - 99 mg/dL 115(H)  BUN 6 - 20 mg/dL 17  Creatinine 0.44 - 1.00 mg/dL 0.72  Sodium 135 - 145 mmol/L 138  Potassium 3.5 - 5.1 mmol/L 3.9   Chloride 98 - 111 mmol/L 98  CO2 22 - 32 mmol/L 29  Calcium 8.9 - 10.3 mg/dL 9.5  Total Protein 6.5 - 8.1 g/dL 7.0  Total Bilirubin 0.3 - 1.2 mg/dL 0.3  Alkaline Phos 38 - 126 U/L 84  AST 15 - 41 U/L 22  ALT 0 - 44 U/L 14   CBC Latest Ref Rng & Units 04/11/2021  WBC 4.0 - 10.5 K/uL  8.5  Hemoglobin 12.0 - 15.0 g/dL 14.2  Hematocrit 36.0 - 46.0 % 43.2  Platelets 150 - 400 K/uL 301    No images are attached to the encounter.  No results found.   Assessment and plan- Patient is a 50 y.o. female with known history of locally advanced left breast cancer which was ER/PR positive and HER2 negative on initial biopsy s/p neoadjuvant AC Taxol chemotherapy here to discuss final pathology results and further management  Results of final pathology were somewhat surprising given that her final receptor status was ER/PR positive and HER2 positive on final pathology and was reported to be HER2 negative on initial biopsy.  She has therefore not received any HER2 directed neoadjuvant therapy.  There was some response in her primary breast tumor but there was 3.6 cm residual tumor in the breast.  Minimal effect seen in the lymph nodes and 7 lymph nodes were positive with extranodal extension.  Since she has already completed chemotherapy neoadjuvantly I would not like to add any further chemotherapy adjuvantly.  Moreover she has not had significant response to chemotherapy anyways.  Given that her tumor was HER2 positive on final pathology she needs HER2 directed treatment.  Given that she has lymph node positive disease I would recommend combination of Herceptin and Perjeta given IV every 3 weeks.  I plan to give this for 1 year followed by consideration to neratinib following that.  If she would have received neoadjuvant HER2 based treatment and did not achieve pathological complete response we would have given her Kadcyla adjuvantly but we do not have that information.  Certainly adding Kadcyla down the  line after she completes Herceptin and Perjeta based treatment is an option since we are currently in the data free zone.  Discussed risks and benefits of Herceptin and Perjeta including all but not limited to possible risk of cardiotoxicity rash and diarrhea.  She has had a baseline echocardiogram in the past and I will obtain a repeat 1 before cycle 2.  She will proceed with cycle 1 of Herceptin and Perjeta this week.  She will be getting this IV without port since port was taken out at the time of surgery.  I would also recommend referral to radiation oncology at this time and upon completion of radiation treatment I will discuss hormone therapy with her.  Patient is interested in getting a bilateral oopherectomy down the line   Total face to face encounter time for this patient visit was 45 min.    Cancer Staging Malignant neoplasm of upper-outer quadrant of left breast in female, estrogen receptor positive ([place]) Staging form: Breast, AJCC 8th Edition - Clinical stage from 10/08/2020: Stage IIIB (cT4, cN1, cM0, G2, ER+, PR+, HER2-) - Signed by Sindy Guadeloupe, MD on 10/08/2020 - Pathologic stage from 04/11/2021: No Stage Recommended (ypT2, pN2a, cM0, G2, ER+, PR+, HER2+) - Signed by Sindy Guadeloupe, MD on 04/11/2021 Stage prefix: Post-therapy Histologic grading system: 3 grade system    Visit Diagnosis 1. Malignant neoplasm of upper-outer quadrant of left breast in female, estrogen receptor positive ([place])   2. Goals of care, counseling/discussion      Dr. Randa Evens, MD, MPH [facility] [phone] 04/11/2021 4:36 PM

## 2021-04-11 NOTE — Progress Notes (Signed)
Pt in pain from surgery and expanders, still working on cutting down with cigarettes

## 2021-04-11 NOTE — Telephone Encounter (Signed)
Dr. Elroy Channel office called and wanted you to fax the paperwork over to them at [phone]

## 2021-04-12 ENCOUNTER — Other Ambulatory Visit: Payer: Self-pay | Admitting: *Deleted

## 2021-04-12 ENCOUNTER — Encounter: Payer: Self-pay | Admitting: *Deleted

## 2021-04-12 DIAGNOSIS — Z170 Estrogen receptor positive status [ER+]: Secondary | ICD-10-CM

## 2021-04-12 DIAGNOSIS — C50412 Malignant neoplasm of upper-outer quadrant of left female breast: Secondary | ICD-10-CM

## 2021-04-12 NOTE — Telephone Encounter (Signed)
Spoke with patient, is feeling well and starting PT next week. Will have her finish PT, note any restrictions and will obtain clearance before she comes back to [facility]. 2 more week medical hold unless said otherwise by providers. Patient understood.

## 2021-04-13 ENCOUNTER — Other Ambulatory Visit: Payer: Self-pay | Admitting: *Deleted

## 2021-04-13 ENCOUNTER — Telehealth: Payer: Self-pay | Admitting: *Deleted

## 2021-04-13 DIAGNOSIS — Z170 Estrogen receptor positive status [ER+]: Secondary | ICD-10-CM

## 2021-04-13 DIAGNOSIS — C50412 Malignant neoplasm of upper-outer quadrant of left female breast: Secondary | ICD-10-CM

## 2021-04-13 NOTE — Telephone Encounter (Signed)
Attempted to call patient with consult appointment, called both phone numbers, no answer and voicemail not set up

## 2021-04-13 NOTE — Progress Notes (Signed)
[id number]

## 2021-04-14 ENCOUNTER — Other Ambulatory Visit: Payer: Self-pay

## 2021-04-14 ENCOUNTER — Telehealth: Payer: Self-pay | Admitting: *Deleted

## 2021-04-14 ENCOUNTER — Telehealth: Payer: Self-pay | Admitting: Oncology

## 2021-04-14 ENCOUNTER — Ambulatory Visit (INDEPENDENT_AMBULATORY_CARE_PROVIDER_SITE_OTHER): Payer: No Typology Code available for payment source

## 2021-04-14 DIAGNOSIS — E538 Deficiency of other specified B group vitamins: Secondary | ICD-10-CM

## 2021-04-14 MED ORDER — CYANOCOBALAMIN 1000 MCG/ML IJ SOLN
1000.0000 ug | Freq: Once | INTRAMUSCULAR | Status: AC
  Administered 2021-04-14: 1000 ug via INTRAMUSCULAR

## 2021-04-14 NOTE — Progress Notes (Signed)
Patient presented for B 12 injection to right deltoid, patient voiced no concerns nor showed any signs of distress during injection. 

## 2021-04-14 NOTE — Telephone Encounter (Signed)
Dr. Janese Banks called Dr. Delana Meyer at [facility] and wanted a urgent appt. I called today and spoke to staff and was told that I need to power share any scans regarding her breast cancer and I have requested it to be done today through radiology. All path slides needs to be sent to [place] [place] [address] [place], [place] [address]  I sent the request to [facility] for the pahtology 03/17/2021 ! sent request [facility] for the 09/15/2020 pathology. I faxed all info, demographics, insurance, MD notes, all pathology, all scans and faxed it to [phone]  I did make it urgent and Dr. Janese Banks had aready spoke to Dr. Bari Mantis personally 6/22

## 2021-04-14 NOTE — Telephone Encounter (Signed)
Spoke with patient about PET scan scheduled on Monday 6/27. Provided day/time/location and instructions. Patient agreeable.

## 2021-04-15 ENCOUNTER — Inpatient Hospital Stay: Payer: No Typology Code available for payment source

## 2021-04-18 ENCOUNTER — Ambulatory Visit: Payer: No Typology Code available for payment source | Admitting: Physical Therapy

## 2021-04-18 ENCOUNTER — Encounter (HOSPITAL_COMMUNITY)
Admission: RE | Admit: 2021-04-18 | Discharge: 2021-04-18 | Disposition: A | Discharge status: Home / Self Care | Payer: No Typology Code available for payment source | Source: Ambulatory Visit | Attending: Oncology | Admitting: Oncology

## 2021-04-18 ENCOUNTER — Encounter: Payer: Self-pay | Admitting: Physical Therapy

## 2021-04-18 ENCOUNTER — Other Ambulatory Visit: Payer: Self-pay

## 2021-04-18 DIAGNOSIS — M25612 Stiffness of left shoulder, not elsewhere classified: Secondary | ICD-10-CM | POA: Diagnosis not present

## 2021-04-18 DIAGNOSIS — Z170 Estrogen receptor positive status [ER+]: Secondary | ICD-10-CM | POA: Insufficient documentation

## 2021-04-18 DIAGNOSIS — C50412 Malignant neoplasm of upper-outer quadrant of left female breast: Secondary | ICD-10-CM

## 2021-04-18 DIAGNOSIS — M25611 Stiffness of right shoulder, not elsewhere classified: Secondary | ICD-10-CM

## 2021-04-18 DIAGNOSIS — Z483 Aftercare following surgery for neoplasm: Secondary | ICD-10-CM

## 2021-04-18 LAB — GLUCOSE, CAPILLARY: Glucose-Capillary: 93 mg/dL (ref 70–99)

## 2021-04-18 MED ORDER — FLUDEOXYGLUCOSE F - 18 (FDG) INJECTION
7.0000 | Freq: Once | INTRAVENOUS | Status: AC | PRN
  Administered 2021-04-18: 6 via INTRAVENOUS

## 2021-04-18 NOTE — Therapy (Signed)
[place], [place], [address] Phone: [phone]   Fax:  [phone]  Physical Therapy Treatment  Patient Details  Name: Tricia Berry MRN: [id number] Date of Birth: Mar 17, 1971 Referring Provider (PT): Cornett   Encounter Date: 04/18/2021   PT End of Session - 04/18/21 1055     Visit Number 3    Number of Visits 10    Date for PT Re-Evaluation 05/05/21    PT Start Time 1003    PT Stop Time 1053    PT Time Calculation (min) 50 min    Activity Tolerance Patient tolerated treatment well    Behavior During Therapy [facility] for tasks assessed/performed             Past Medical History:  Diagnosis Date   Anxiety    Breast cancer ([place])    Diverticulitis    Family history of ovarian cancer    GERD (gastroesophageal reflux disease)    IBS (irritable bowel syndrome)    PONV (postoperative nausea and vomiting)    severe migraine and vomiting post anesthesia   Scoliosis     Past Surgical History:  Procedure Laterality Date   ABDOMINAL HYSTERECTOMY     still has ovaries, no gyn cancer, hysterectomy due to endometriosis. NO cervix on exam 01/10/21   BREAST BIOPSY Left 09/15/2020   [place] bx of mass, path pending, Q marker   BREAST BIOPSY Left 09/15/2020   [place] bx of LN, hydromarker, path pending   BREAST RECONSTRUCTION WITH PLACEMENT OF TISSUE EXPANDER AND FLEX HD (ACELLULAR HYDRATED DERMIS) Bilateral 03/17/2021   Procedure: IMMEDIATE BILATERAL BREAST RECONSTRUCTION WITH PLACEMENT OF TISSUE EXPANDER AND FLEX HD (ACELLULAR HYDRATED DERMIS);  Surgeon: Wallace Going, DO;  Location: [place];  Service: Plastics;  Laterality: Bilateral;   IR IMAGING GUIDED PORT INSERTION  10/01/2020   MODIFIED MASTECTOMY Left 03/17/2021   Procedure: LEFT MODIFIED RADICAL MASTECTOMY;  Surgeon: Erroll Luna, MD;  Location: [place];  Service: General;  Laterality: Left;   PORTA CATH  REMOVAL Right 03/17/2021   Procedure: PORTA CATH REMOVAL;  Surgeon: Erroll Luna, MD;  Location: [place];  Service: General;  Laterality: Right;   TOTAL MASTECTOMY Right 03/17/2021   Procedure: TOTAL MASTECTOMY;  Surgeon: Erroll Luna, MD;  Location: [place];  Service: General;  Laterality: Right;    There were no vitals filed for this visit.   Subjective Assessment - 04/18/21 1003     Subjective I have been doing too much. I did lift a bag of dirt and I planted some things and pulled weeds. I have been more sore in the L axilla. I am dressing myself and I am reaching for things.    Pertinent History L breast cancer ER+/PR+ HER2-, stage III, will undergo a bilateral mastectomy and ALND on L (7/16) on 03/17/21, neuropathy of hands, lower legs and feet    Patient Stated Goals to gain info from providers    Currently in Pain? Yes    Pain Score 4     Pain Location Axilla    Pain Orientation Left    Pain Descriptors / Indicators Sore    Pain Type Acute pain    Pain Onset In the past 7 days    Pain Frequency Intermittent    Aggravating Factors  moving it    Pain Relieving Factors pain meds, being still    Effect of Pain on Daily Activities pt reports  she works through the pain                [facility] PT Assessment - 04/18/21 0001       AROM   Right Shoulder Extension 67 Degrees    Right Shoulder Flexion 170 Degrees    Right Shoulder ABduction 180 Degrees    Right Shoulder Internal Rotation 76 Degrees    Right Shoulder External Rotation 122 Degrees    Left Shoulder Extension 57 Degrees    Left Shoulder Flexion 128 Degrees    Left Shoulder ABduction 145 Degrees    Left Shoulder Internal Rotation 78 Degrees    Left Shoulder External Rotation 101 Degrees                           [facility] Adult PT Treatment/Exercise - 04/18/21 0001       Manual Therapy   Manual Therapy Soft tissue mobilization;Manual Lymphatic Drainage  (MLD);Passive ROM;Myofascial release    Soft tissue mobilization to L pec to decrease discomfort    Myofascial Release to cording in L axilla    Manual Lymphatic Drainage (MLD) in supine: short neck, 5 diaphragmatic breaths, left inguinal nodes then to R S/L for L lateral trunk/posterior axilla then retracing all steps    Passive ROM to L shoulder in direction of flexion and abduction                         PT Long Term Goals - 04/07/21 1357       PT LONG TERM GOAL #1   Title Pt will return to baseline shoulder ROM measurements and not demonstrate any signs or symptoms of lymphedema.    Time 4    Period Weeks    Status On-going      PT LONG TERM GOAL #2   Title Pt will demonstrate 165 degrees of bilateral flexion to allow pt to reach overhead.    Baseline R 143 L 101    Time 4    Period Weeks    Status New    Target Date 05/05/21      PT LONG TERM GOAL #3   Title Pt will demonstrate 165 degrees of bilateral shoulder abduction to allow her to reach out the side    Baseline R 152 L 78    Time 4    Period Weeks    Status New    Target Date 05/05/21      PT LONG TERM GOAL #4   Title Pt will report she is no longer having muscle spasms across her chest to allow improved comfort    Time 4    Period Weeks    Status New    Target Date 05/05/21      PT LONG TERM GOAL #5   Title Pt will be independent in a home exercise program for long term strengthening and stretching.    Time 4    Period Weeks    Status New    Target Date 05/05/21      Additional Long Term Goals   Additional Long Term Goals Yes      PT LONG TERM GOAL #6   Title Pt will report a 50% improvement in edema in L trunk and chest to allow improved comfort.    Time 4    Period Weeks    Status New    Target Date 05/05/21  Plan - 04/18/21 1055     Clinical Impression Statement Remeasured bilateral shoulder ROM at beginning of session and pt demonstrated a huge  improvement since evaluation. She reports she has been doing the post op stretches and has been doing them daily as well as working on moving her arm and using her arms to reach up in to a cabinet. Her R shoulder ROM is nearly back to baseline and her L is still limited but improving. Began PROM to L shoulder in direction of flexion and abduction then performed soft tissue mobilization to L pec to reduce tightness. Pt's edema is improving but began MLD to L posterior axilla and lateral trunk to decrease swelling.    PT Frequency 2x / week    PT Duration 4 weeks    PT Treatment/Interventions ADLs/Self Care Home Management;Therapeutic exercise;Patient/family education;Scar mobilization;Passive range of motion;Manual techniques;Manual lymph drainage;Compression bandaging;Taping;Vasopneumatic Device    PT Next Visit Plan if pt continues to demonstrate good ROM then give supine scap with yellow band, cont PROM to bilateral shoulders (L worse than R) after Tuesday, MLD to L lateral trunk/axilla/anterior chest    PT Home Exercise Plan post op breast exercises, instructed pt in supine dowel    Consulted and Agree with Plan of Care Patient             Patient will benefit from skilled therapeutic intervention in order to improve the following deficits and impairments:  Pain, Postural dysfunction, Decreased knowledge of precautions, Impaired UE functional use, Increased fascial restricitons, Decreased strength, Decreased range of motion, Decreased scar mobility, Increased edema, Increased muscle spasms  Visit Diagnosis: Stiffness of left shoulder, not elsewhere classified  Stiffness of right shoulder, not elsewhere classified  Aftercare following surgery for neoplasm     Problem List Patient Active Problem List   Diagnosis Date Noted   Breast cancer ([place]) 03/17/2021   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   Genetic testing 11/22/2020   Family history of ovarian cancer    Candidal  vulvovaginitis 11/01/2020   Low back pain 10/06/2020   Encounter for medical examination to establish care 09/26/2020   Malignant neoplasm of upper-outer quadrant of left breast in female, estrogen receptor positive ([place]) 09/26/2020   BACK STRAIN, LUMBAR 03/08/2010   INTERNAL HEMORRHOIDS 01/27/2010   ANAL FISSURE 01/27/2010   OSTEOARTHRITIS, HANDS, BILATERAL 01/04/2010   ARTHRALGIA 10/05/2009   INSOMNIA, CHRONIC 05/06/2009   ALLERGIC RHINITIS, SEASONAL 02/09/2009   TOBACCO ABUSE 12/29/2008   IBS 10/13/2008   Anxiety state 08/25/2008   DEPRESSION, RECURRENT 08/25/2008    Allyson Sabal [place] [facility] 04/18/2021, 10:59 AM  [place], [place], [address] Phone: [phone]   Fax:  [phone]  Name: Tricia Berry MRN: [id number] Date of Birth: 20-Sep-1971   Manus Gunning, PT 04/18/21 10:59 AM

## 2021-04-19 ENCOUNTER — Other Ambulatory Visit: Payer: Self-pay | Admitting: Oncology

## 2021-04-19 ENCOUNTER — Other Ambulatory Visit: Payer: Self-pay

## 2021-04-19 ENCOUNTER — Encounter: Payer: Self-pay | Admitting: Oncology

## 2021-04-19 ENCOUNTER — Ambulatory Visit: Payer: No Typology Code available for payment source

## 2021-04-19 DIAGNOSIS — C50912 Malignant neoplasm of unspecified site of left female breast: Secondary | ICD-10-CM

## 2021-04-19 DIAGNOSIS — M25612 Stiffness of left shoulder, not elsewhere classified: Secondary | ICD-10-CM | POA: Diagnosis not present

## 2021-04-19 DIAGNOSIS — R293 Abnormal posture: Secondary | ICD-10-CM

## 2021-04-19 DIAGNOSIS — Z483 Aftercare following surgery for neoplasm: Secondary | ICD-10-CM

## 2021-04-19 DIAGNOSIS — M25611 Stiffness of right shoulder, not elsewhere classified: Secondary | ICD-10-CM

## 2021-04-19 DIAGNOSIS — Z170 Estrogen receptor positive status [ER+]: Secondary | ICD-10-CM

## 2021-04-19 NOTE — Therapy (Signed)
[place], [place], [address] Phone: [phone]   Fax:  [phone]  Physical Therapy Treatment  Patient Details  Name: Tricia Berry MRN: [id number] Date of Birth: 05-23-1971 Referring Provider (PT): Cornett   Encounter Date: 04/19/2021   PT End of Session - 04/19/21 1058     Visit Number 4    Number of Visits 10    Date for PT Re-Evaluation 05/05/21    PT Start Time 1004    PT Stop Time 1054    PT Time Calculation (min) 50 min    Activity Tolerance Patient tolerated treatment well    Behavior During Therapy [facility] for tasks assessed/performed             Past Medical History:  Diagnosis Date   Anxiety    Breast cancer ([place])    Diverticulitis    Family history of ovarian cancer    GERD (gastroesophageal reflux disease)    IBS (irritable bowel syndrome)    PONV (postoperative nausea and vomiting)    severe migraine and vomiting post anesthesia   Scoliosis     Past Surgical History:  Procedure Laterality Date   ABDOMINAL HYSTERECTOMY     still has ovaries, no gyn cancer, hysterectomy due to endometriosis. NO cervix on exam 01/10/21   BREAST BIOPSY Left 09/15/2020   [place] bx of mass, path pending, Q marker   BREAST BIOPSY Left 09/15/2020   [place] bx of LN, hydromarker, path pending   BREAST RECONSTRUCTION WITH PLACEMENT OF TISSUE EXPANDER AND FLEX HD (ACELLULAR HYDRATED DERMIS) Bilateral 03/17/2021   Procedure: IMMEDIATE BILATERAL BREAST RECONSTRUCTION WITH PLACEMENT OF TISSUE EXPANDER AND FLEX HD (ACELLULAR HYDRATED DERMIS);  Surgeon: Wallace Going, DO;  Location: [place];  Service: Plastics;  Laterality: Bilateral;   IR IMAGING GUIDED PORT INSERTION  10/01/2020   MODIFIED MASTECTOMY Left 03/17/2021   Procedure: LEFT MODIFIED RADICAL MASTECTOMY;  Surgeon: Erroll Luna, MD;  Location: [place];  Service: General;  Laterality: Left;   PORTA CATH  REMOVAL Right 03/17/2021   Procedure: PORTA CATH REMOVAL;  Surgeon: Erroll Luna, MD;  Location: [place];  Service: General;  Laterality: Right;   TOTAL MASTECTOMY Right 03/17/2021   Procedure: TOTAL MASTECTOMY;  Surgeon: Erroll Luna, MD;  Location: [place];  Service: General;  Laterality: Right;    There were no vitals filed for this visit.   Subjective Assessment - 04/19/21 1006     Subjective Still sore from yesterday.  She put me through the ringer yesterday.    Pertinent History L breast cancer ER+/PR+ HER2-, stage III, will undergo a bilateral mastectomy and ALND on L (7/16) on 03/17/21, neuropathy of hands, lower legs and feet    Patient Stated Goals to gain info from providers    Currently in Pain? Yes    Pain Score 2     Pain Location Axilla    Pain Orientation Left    Pain Type Acute pain    Pain Onset More than a month ago    Pain Frequency Constant    Multiple Pain Sites No                               [facility] Adult PT Treatment/Exercise - 04/19/21 0001       Manual Therapy   Manual Therapy Soft tissue mobilization;Myofascial release;Manual Lymphatic Drainage (MLD);Passive ROM  Soft tissue mobilization to L pec to decrease discomfort and improve ROM    Myofascial Release to cording in L axilla    Manual Lymphatic Drainage (MLD) in supine: short neck, 5 diaphragmatic breaths, left inguinal nodes then to R S/L for L lateral trunk/posterior axilla then retracing all steps    Passive ROM to L shoulder in direction of flexion and abduction and D 2 flexion                    PT Education - 04/19/21 1058     Education Details Pt was educated in supine wand for flexion and scaption and stargazer stretch    Person(s) Educated Patient    Methods Demonstration;Handout    Comprehension Returned demonstration;Verbalized understanding                 PT Long Term Goals - 04/07/21 1357       PT  LONG TERM GOAL #1   Title Pt will return to baseline shoulder ROM measurements and not demonstrate any signs or symptoms of lymphedema.    Time 4    Period Weeks    Status On-going      PT LONG TERM GOAL #2   Title Pt will demonstrate 165 degrees of bilateral flexion to allow pt to reach overhead.    Baseline R 143 L 101    Time 4    Period Weeks    Status New    Target Date 05/05/21      PT LONG TERM GOAL #3   Title Pt will demonstrate 165 degrees of bilateral shoulder abduction to allow her to reach out the side    Baseline R 152 L 78    Time 4    Period Weeks    Status New    Target Date 05/05/21      PT LONG TERM GOAL #4   Title Pt will report she is no longer having muscle spasms across her chest to allow improved comfort    Time 4    Period Weeks    Status New    Target Date 05/05/21      PT LONG TERM GOAL #5   Title Pt will be independent in a home exercise program for long term strengthening and stretching.    Time 4    Period Weeks    Status New    Target Date 05/05/21      Additional Long Term Goals   Additional Long Term Goals Yes      PT LONG TERM GOAL #6   Title Pt will report a 50% improvement in edema in L trunk and chest to allow improved comfort.    Time 4    Period Weeks    Status New    Target Date 05/05/21                   Plan - 04/19/21 1059     Clinical Impression Statement Pt was very sore after yesterdays visit, but did very well today with near full PROM with only occasional VC's to relax.  She postures with left scapular elevation and protraction and requires VC's to correct.  She is motivated and compliant.  She did very well with AAROM with wand and her HEP was updated    Examination-Activity Limitations Reach Overhead;Carry;Lift;Dressing    Examination-Participation Restrictions Driving;Occupation;Yard Work;Meal Prep;Community Activity;Cleaning;Laundry;Shop    Stability/Clinical Decision Making Stable/Uncomplicated    Rehab  Potential Good  PT Frequency 2x / week    PT Duration 4 weeks    PT Treatment/Interventions ADLs/Self Care Home Management;Therapeutic exercise;Patient/family education;Scar mobilization;Passive range of motion;Manual techniques;Manual lymph drainage;Compression bandaging;Taping;Vasopneumatic Device    PT Next Visit Plan if pt continues to demonstrate good ROM then give, Supine AROM flex, scaption, HA, supine scap with yellow band, cont PROM to bilateral shoulders (L worse than R) after Tuesday, MLD to L lateral trunk/axilla/anterior chest    PT Home Exercise Plan post op breast exercises, instructed pt in supine dowel    Consulted and Agree with Plan of Care Patient             Patient will benefit from skilled therapeutic intervention in order to improve the following deficits and impairments:  Pain, Postural dysfunction, Decreased knowledge of precautions, Impaired UE functional use, Increased fascial restricitons, Decreased strength, Decreased range of motion, Decreased scar mobility, Increased edema, Increased muscle spasms  Visit Diagnosis: Stiffness of left shoulder, not elsewhere classified  Stiffness of right shoulder, not elsewhere classified  Aftercare following surgery for neoplasm  Abnormal posture  Malignant 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, estrogen receptor positive, unspecified site of breast [facility])     Problem List Patient Active Problem List   Diagnosis Date Noted   Breast cancer ([place]) 03/17/2021   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/2021   Genetic testing 11/22/2020   Family history of ovarian cancer    Candidal vulvovaginitis 11/01/2020   Low back pain 10/06/2020   Encounter for medical examination to establish care 09/26/2020   Malignant neoplasm of upper-outer quadrant of left breast in female, estrogen receptor positive ([place]) 09/26/2020   BACK STRAIN, LUMBAR 03/08/2010   INTERNAL HEMORRHOIDS 01/27/2010   ANAL FISSURE 01/27/2010    OSTEOARTHRITIS, HANDS, BILATERAL 01/04/2010   ARTHRALGIA 10/05/2009   INSOMNIA, CHRONIC 05/06/2009   ALLERGIC RHINITIS, SEASONAL 02/09/2009   TOBACCO ABUSE 12/29/2008   IBS 10/13/2008   Anxiety state 08/25/2008   DEPRESSION, RECURRENT 08/25/2008    Elsie Ra Tyara Dassow 04/19/2021, 12:02 PM  [place] [address] [place], [place], [address] Phone: [phone]   Fax:  [phone]  Name: LACHANDA BUCZEK MRN: [id number] Date of Birth: [birth year] Cheral [place], PT 04/19/21 12:03 PM

## 2021-04-19 NOTE — Patient Instructions (Signed)
SHOULDER: Flexion - Supine (Cane)        Cancer Rehab [id number]    Hold cane in both hands. Raise arms up overhead. Do not allow back to arch. Hold _5__ seconds. Do __5-10__ times; __1-2__ times a day. 1. Hands shoulder width part 2. Hands slightly wider apart:V position   Shoulder Blade Stretch    Clasp fingers behind head with elbows touching in front of face. Pull elbows back while pressing shoulder blades together. Relax and hold as tolerated, can place pillow under elbow here for comfort as needed and to allow for prolonged stretch.  Repeat __5__ times. Do __1-2__ sessions per day.    Copyright  VHI. All rights reserved.

## 2021-04-20 ENCOUNTER — Telehealth: Payer: Self-pay | Admitting: *Deleted

## 2021-04-21 ENCOUNTER — Encounter: Payer: Self-pay | Admitting: Oncology

## 2021-04-21 ENCOUNTER — Encounter: Payer: Self-pay | Admitting: Radiation Oncology

## 2021-04-21 ENCOUNTER — Ambulatory Visit
Admission: RE | Admit: 2021-04-21 | Discharge: 2021-04-21 | Disposition: A | Discharge status: Home / Self Care | Payer: No Typology Code available for payment source | Source: Ambulatory Visit | Attending: Radiation Oncology | Admitting: Radiation Oncology

## 2021-04-21 VITALS — BP 115/84 | HR 98 | Temp 96.4°F | Resp 16 | Wt 116.9 lb

## 2021-04-21 DIAGNOSIS — C50412 Malignant neoplasm of upper-outer quadrant of left female breast: Secondary | ICD-10-CM

## 2021-04-21 DIAGNOSIS — Z170 Estrogen receptor positive status [ER+]: Secondary | ICD-10-CM

## 2021-04-21 NOTE — Consult Note (Signed)
NEW PATIENT EVALUATION  Name: Tricia Berry  MRN: [id number]  Date:   04/21/2021     DOB: 21-Jun-1971   This 50 y.o. female patient presents to the clinic for initial evaluation of stage III (pathologic stage T2 N2 M0) ER/PR positive HER2 positive by FISH invasive mammary carcinoma of the left breast status post neoadjuvant chemotherapy followed by bilateral mastectomies and axillary node dissection.  REFERRING PHYSICIAN: Burnard Hawthorne, FNP  CHIEF COMPLAINT:  Chief Complaint  Patient presents with   Breast Cancer    Initial consultation    DIAGNOSIS: The encounter diagnosis was Malignant neoplasm of upper-outer quadrant of left breast in female, estrogen receptor positive (Tricia Berry).   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  This lump gradually increased in size and developed ulceration of the skin.  She underwent in November bilateral mammograms showing hypoechoic interconnecting masses in the left breast from 10:00 to 2:00 spanning at least 4.8 cm.  Ultrasound also confirmed 5 abnormal lymph nodes in the axilla with cortical thickening suggestive of metastatic disease.  Biopsy of both breast and lymph nodes showed invasive mammary carcinoma grade 2 ER/PR positive HER2/neu not overexpressed.  Mammogram showed hypermetabolic activity in the left lateral breast consistent with primary breast cancer.  There is no evidence of local left axillary nodal metastasis or distant disease.  MRI of her left breast showed extensive multicentric left breast cancer extending from the nipple to the pectoralis muscle without invasion of muscle.  Skin was involved lateral to the nipple with satellite masses in the upper inner quadrant.  Limit malignancy except spanned at least 9.2 x 7.3 x 6.4 cm.  There is also at least 4  abnormal left axillary lymph nodes.  She underwent dose dense chemotherapy with AC-T started in December.  Patient underwent bilateral mastectomies showing a grade 2 invasive mammary carcinoma with tumor size now decreased to 3.6 x 3.2 x 2.3 cm.  All surgical margins were clear.  16 lymph nodes showed 7 with macro metastatic disease greater than 2 mm with extranodal extension positive.  Interesting now her HER2/neu status was positive by FISH.  She is now scheduled for HER2/neu based treatment postoperatively.  She is got bilateral expanders present in both breasts at this time.  She specifically [place] any chest wall pain she is doing some physical therapy for her left upper extremity and is now referred to radiation collagen for opinion.  PLANNED TREATMENT REGIMEN: Left breast and peripheral lymphatic radiation  PAST MEDICAL HISTORY:  has a past medical history of Anxiety, Breast cancer ([place]), Diverticulitis, Family history of ovarian cancer, GERD (gastroesophageal reflux disease), IBS (irritable bowel syndrome), PONV (postoperative nausea and vomiting), and Scoliosis.    PAST SURGICAL HISTORY:  Past Surgical History:  Procedure Laterality Date   ABDOMINAL HYSTERECTOMY     still has ovaries, no gyn cancer, hysterectomy due to endometriosis. NO cervix on exam 01/10/21   BREAST BIOPSY Left 09/15/2020   [place] bx of mass, path pending, Q marker   BREAST BIOPSY Left 09/15/2020   [place] bx of LN, hydromarker, path pending   BREAST RECONSTRUCTION WITH PLACEMENT OF TISSUE EXPANDER AND FLEX HD (ACELLULAR HYDRATED DERMIS) Bilateral 03/17/2021   Procedure: IMMEDIATE BILATERAL BREAST RECONSTRUCTION WITH PLACEMENT OF TISSUE EXPANDER AND FLEX HD (ACELLULAR HYDRATED DERMIS);  Surgeon: Wallace Going, DO;  Location: [facility];  Service: Plastics;  Laterality: Bilateral;   IR IMAGING GUIDED PORT INSERTION  10/01/2020   MODIFIED MASTECTOMY Left 03/17/2021   Procedure: LEFT MODIFIED RADICAL MASTECTOMY;   Surgeon: Erroll Luna, MD;  Location: [facility];  Service: General;  Laterality: Left;   PORTA CATH REMOVAL Right 03/17/2021   Procedure: PORTA CATH REMOVAL;  Surgeon: Erroll Luna, MD;  Location: [facility];  Service: General;  Laterality: Right;   TOTAL MASTECTOMY Right 03/17/2021   Procedure: TOTAL MASTECTOMY;  Surgeon: Erroll Luna, MD;  Location: [facility];  Service: General;  Laterality: Right;    FAMILY HISTORY: family history includes Diverticulitis in her brother and mother; Gout in her father; Heart failure in her father; Hypertension in her father and mother; Osteoarthritis in her mother; Ovarian cancer in her paternal grandmother.  SOCIAL HISTORY:  reports that she has been smoking cigarettes. She has been smoking an average of 0.25 packs per day. She has never used smokeless tobacco. She reports previous alcohol use. She reports that she does not use drugs.  ALLERGIES: Morphine, Sertraline hcl, Sulfamethoxazole, and Sulfonamide derivatives  MEDICATIONS:  Current Outpatient Medications  Medication Sig Dispense Refill   diazepam (VALIUM) 2 MG tablet Take 1 tablet (2 mg total) by mouth every 12 (twelve) hours as needed for muscle spasms. 20 tablet 1   DULoxetine (CYMBALTA) 60 MG capsule Take 1 capsule (60 mg total) by mouth daily. 90 capsule 3   Lactobacillus (PROBIOTIC ACIDOPHILUS PO) Take 1 capsule by mouth daily.     Multiple Vitamins-Minerals (MULTIVITAL PO) Take 1 Dose by mouth daily.     nicotine (NICODERM CQ - DOSED IN MG/24 HOURS) 21 mg/24hr patch PLACE 1 PATCH (21 MG TOTAL) ONTO THE SKIN DAILY. 28 patch 0   nicotine (NICOTROL) 10 MG inhaler FOLLOW INSTRUCTIONS ON PACKAGE AS NEEDED FOR SMOKING CESSATION 168 each 0   nicotine polacrilex (SM NICOTINE POLACRILEX) 4 MG lozenge take as directed on package 72 lozenge 0   ondansetron (ZOFRAN) 4 MG tablet Take 1 tablet (4 mg total) by mouth every 8 (eight) hours as needed for  nausea or vomiting. 20 tablet 0   oxyCODONE (OXY IR/ROXICODONE) 5 MG immediate release tablet 1-2 tablets every 6 hours as needed for pain 240 tablet 0   traZODone (DESYREL) 50 MG tablet Take 0.5-1 tablets (25-50 mg total) by mouth at bedtime as needed for sleep. 30 tablet 3   No current facility-administered medications for this encounter.   Facility-Administered Medications Ordered in Other Encounters  Medication Dose Route Frequency Provider Last Rate Last Admin   heparin lock flush 100 unit/mL  500 Units Intravenous Once Sindy Guadeloupe, MD        ECOG PERFORMANCE STATUS:  0 - Asymptomatic  REVIEW OF SYSTEMS: Patient denies any weight loss, fatigue, weakness, fever, chills or night sweats. Patient denies any loss of vision, blurred vision. Patient denies any ringing  of the ears or hearing loss. No irregular heartbeat. Patient denies heart murmur or history of fainting. Patient denies any chest pain or pain radiating to her upper extremities. Patient denies any shortness of breath, difficulty breathing at night, cough or hemoptysis. Patient denies any swelling in the lower legs. Patient denies any nausea vomiting, vomiting of blood, or coffee ground material in the vomitus. Patient denies any stomach pain. Patient states has had normal bowel movements no significant constipation or diarrhea. Patient denies any dysuria, hematuria or significant nocturia. Patient denies any problems walking, swelling  in the joints or loss of balance. Patient denies any skin changes, loss of hair or loss of weight. Patient denies any excessive worrying or anxiety or significant depression. Patient denies any problems with insomnia. Patient denies excessive thirst, polyuria, polydipsia. Patient denies any swollen glands, patient denies easy bruising or easy bleeding. Patient denies any recent infections, allergies or URI. Patient "s visual fields have not changed significantly in recent time.   PHYSICAL EXAM: BP  115/84 (BP Location: Left Arm, Patient Position: Sitting)   Pulse 98   Temp (!) 96.4 F (35.8 C) (Tympanic)   Resp 16   Wt 116 lb 14.4 oz (53 kg)   BMI 22.83 kg/m  Patient is status post bilateral mastectomies with tissue expanders present in both chest wall.  No dominant masses noted in either breast.  No axillary or supraclavicular adenopathy is appreciated.  Well-developed well-nourished patient in NAD. HEENT reveals PERLA, EOMI, discs not visualized.  Oral cavity is clear. No oral mucosal lesions are identified. Neck is clear without evidence of cervical or supraclavicular adenopathy. Lungs are clear to A&P. Cardiac examination is essentially unremarkable with regular rate and rhythm without murmur rub or thrill. Abdomen is benign with no organomegaly or masses noted. Motor sensory and DTR levels are equal and symmetric in the upper and lower extremities. Cranial nerves II through XII are grossly intact. Proprioception is intact. No peripheral adenopathy or edema is identified. No motor or sensory levels are noted. Crude visual fields are within normal range.  LABORATORY DATA: Pathology report reviewed    RADIOLOGY RESULTS: PET scan mammograms ultrasound as well as MRI scans reviewed compatible with above-stated findings   IMPRESSION: Stage III invasive mammary carcinoma of the left breast status post neoadjuvant chemotherapy followed by mastectomy and axillary node dissection in 50 year old female  PLAN: At this time like to go ahead with left breast and peripheral lymphatic radiation.  Would plan on delivering 5040 cGy in 28 fractions to both areas would also boost her scar another 1000 cGy using electron beam.  Risks and benefits of treatment including skin reaction fatigue alteration of blood counts were all described in detail to the patient.  I will contact plastic surgeon to discuss timing of radiation although I like to get started as soon as possible.  Do not think will be difficult to  do her switch out for her permanent breast implants after radiation although that will be discussed with plastic surgery.  Patient also will continue HER2/neu directed treatment under medical oncology's direction.  Patient comprehends my recommendations well.  I also emphasized the reason for continuing physical therapy for her left upper extremity as well as continued exercise and movement to prevent lymphedema in the future.  Patient comprehends my recommendations well.  I would like to take this opportunity to thank you for allowing me to participate in the care of your patient.Noreene Filbert, MD

## 2021-04-22 ENCOUNTER — Ambulatory Visit (INDEPENDENT_AMBULATORY_CARE_PROVIDER_SITE_OTHER): Payer: No Typology Code available for payment source | Admitting: Surgical

## 2021-04-22 ENCOUNTER — Other Ambulatory Visit: Payer: Self-pay

## 2021-04-22 DIAGNOSIS — C50412 Malignant neoplasm of upper-outer quadrant of left female breast: Secondary | ICD-10-CM

## 2021-04-22 DIAGNOSIS — Z170 Estrogen receptor positive status [ER+]: Secondary | ICD-10-CM

## 2021-04-22 DIAGNOSIS — Z719 Counseling, unspecified: Secondary | ICD-10-CM

## 2021-04-22 NOTE — Progress Notes (Signed)
Patient is a 50 year old female here for follow-up on her bilateral breast reconstruction.  She underwent bilateral mastectomy by [name] followed by bilateral immediate breast reconstruction with placement of tissue expanders with Dr. Marla Roe on 03/17/2021.  Patient was last seen in our office on 04/05/2021.  She is 5 weeks postop  Per EMR review she saw a radiation oncology Noreene Filbert, MD) yesterday and they recommend radiation to the left breast with peripheral lymphatic radiation.  He would like to get started as soon as possible.  She is also currently undergoing physical therapy for improvement in range of motion and strength after mastectomy. She reports that physical therapy is going well.  Chaperone present on exam On exam bilateral breast incisions are intact, bilateral breasts are symmetric, no erythema or cellulitic changes.  No subcutaneous fluid collection noted.  Bilateral JP drain insertion sites are healing well.  No open wounds are noted.    We placed injectable saline in the Expander using a sterile technique: Right: 50 cc for a total of 300 / 455 cc Left: 50 cc for a total of 300 / 455 cc  Pictures were obtained of the patient and placed in the chart with the patient's or guardian's permission.  I discussed with the patient that we would plan for exchange as soon as possible. I discussed with her that ideally we would like to do this prior to radiation, however if radiation is urgent and necessary prior to exchange then we will accommodate the plan as able.  I did discuss with her that during the radiation process we would not be able to do any fills to prevent any change in the CT mapping.  I have spoken with Dr. Marla Roe in regards to this and we will plan for exchange as soon as possible

## 2021-04-26 ENCOUNTER — Ambulatory Visit: Payer: No Typology Code available for payment source

## 2021-04-26 ENCOUNTER — Other Ambulatory Visit: Payer: Self-pay | Admitting: Pharmacist

## 2021-04-26 ENCOUNTER — Other Ambulatory Visit: Payer: Self-pay | Admitting: Oncology

## 2021-04-27 ENCOUNTER — Other Ambulatory Visit: Payer: Self-pay

## 2021-04-27 ENCOUNTER — Encounter: Payer: Self-pay | Admitting: Oncology

## 2021-04-27 ENCOUNTER — Inpatient Hospital Stay: Payer: No Typology Code available for payment source

## 2021-04-27 ENCOUNTER — Inpatient Hospital Stay: Payer: No Typology Code available for payment source | Attending: Oncology

## 2021-04-27 ENCOUNTER — Ambulatory Visit: Payer: No Typology Code available for payment source

## 2021-04-27 ENCOUNTER — Inpatient Hospital Stay (HOSPITAL_BASED_OUTPATIENT_CLINIC_OR_DEPARTMENT_OTHER): Payer: No Typology Code available for payment source | Admitting: Oncology

## 2021-04-27 VITALS — BP 111/74 | HR 82 | Resp 18

## 2021-04-27 VITALS — BP 118/84 | HR 84 | Temp 96.1°F | Resp 20 | Wt 116.7 lb

## 2021-04-27 DIAGNOSIS — C50412 Malignant neoplasm of upper-outer quadrant of left female breast: Secondary | ICD-10-CM | POA: Diagnosis not present

## 2021-04-27 DIAGNOSIS — T451X5A Adverse effect of antineoplastic and immunosuppressive drugs, initial encounter: Secondary | ICD-10-CM | POA: Diagnosis not present

## 2021-04-27 DIAGNOSIS — Z5111 Encounter for antineoplastic chemotherapy: Secondary | ICD-10-CM | POA: Diagnosis present

## 2021-04-27 DIAGNOSIS — C773 Secondary and unspecified malignant neoplasm of axilla and upper limb lymph nodes: Secondary | ICD-10-CM | POA: Insufficient documentation

## 2021-04-27 DIAGNOSIS — Z170 Estrogen receptor positive status [ER+]: Secondary | ICD-10-CM

## 2021-04-27 DIAGNOSIS — Z79899 Other long term (current) drug therapy: Secondary | ICD-10-CM | POA: Diagnosis not present

## 2021-04-27 DIAGNOSIS — G620 Drug-induced polyneuropathy: Secondary | ICD-10-CM

## 2021-04-27 DIAGNOSIS — G893 Neoplasm related pain (acute) (chronic): Secondary | ICD-10-CM

## 2021-04-27 DIAGNOSIS — Z5112 Encounter for antineoplastic immunotherapy: Secondary | ICD-10-CM

## 2021-04-27 LAB — CBC WITH DIFFERENTIAL/PLATELET
Abs Immature Granulocytes: 0.03 10*3/uL (ref 0.00–0.07)
Basophils Absolute: 0 10*3/uL (ref 0.0–0.1)
Basophils Relative: 0 %
Eosinophils Absolute: 0.2 10*3/uL (ref 0.0–0.5)
Eosinophils Relative: 2 %
HCT: 44.7 % (ref 36.0–46.0)
Hemoglobin: 14.7 g/dL (ref 12.0–15.0)
Immature Granulocytes: 0 %
Lymphocytes Relative: 31 %
Lymphs Abs: 2.8 10*3/uL (ref 0.7–4.0)
MCH: 29.3 pg (ref 26.0–34.0)
MCHC: 32.9 g/dL (ref 30.0–36.0)
MCV: 89.2 fL (ref 80.0–100.0)
Monocytes Absolute: 0.5 10*3/uL (ref 0.1–1.0)
Monocytes Relative: 6 %
Neutro Abs: 5.4 10*3/uL (ref 1.7–7.7)
Neutrophils Relative %: 61 %
Platelets: 254 10*3/uL (ref 150–400)
RBC: 5.01 MIL/uL (ref 3.87–5.11)
RDW: 14.1 % (ref 11.5–15.5)
WBC: 9 10*3/uL (ref 4.0–10.5)
nRBC: 0 % (ref 0.0–0.2)

## 2021-04-27 LAB — COMPREHENSIVE METABOLIC PANEL
ALT: 17 U/L (ref 0–44)
AST: 25 U/L (ref 15–41)
Albumin: 4 g/dL (ref 3.5–5.0)
Alkaline Phosphatase: 92 U/L (ref 38–126)
Anion gap: 9 (ref 5–15)
BUN: <5 mg/dL — ABNORMAL LOW (ref 6–20)
CO2: 25 mmol/L (ref 22–32)
Calcium: 9.4 mg/dL (ref 8.9–10.3)
Chloride: 103 mmol/L (ref 98–111)
Creatinine, Ser: 0.72 mg/dL (ref 0.44–1.00)
GFR, Estimated: >60 mL/min (ref >60)
Glucose, Bld: 114 mg/dL — ABNORMAL HIGH (ref 70–99)
Potassium: 4 mmol/L (ref 3.5–5.1)
Sodium: 137 mmol/L (ref 135–145)
Total Bilirubin: 0.5 mg/dL (ref 0.3–1.2)
Total Protein: 7 g/dL (ref 6.5–8.1)

## 2021-04-27 MED ORDER — SODIUM CHLORIDE 0.9 % IV SOLN
420.0000 mg | Freq: Once | INTRAVENOUS | Status: AC
  Administered 2021-04-27: 420 mg via INTRAVENOUS
  Filled 2021-04-27: qty 14

## 2021-04-27 MED ORDER — TRASTUZUMAB-DKST CHEMO 150 MG IV SOLR
450.0000 mg | Freq: Once | INTRAVENOUS | Status: AC
  Administered 2021-04-27: 450 mg via INTRAVENOUS
  Filled 2021-04-27: qty 21.4

## 2021-04-27 MED ORDER — SODIUM CHLORIDE 0.9 % IV SOLN
Freq: Once | INTRAVENOUS | Status: AC
Start: 2021-04-27 — End: 2021-04-27
  Filled 2021-04-27: qty 250

## 2021-04-27 MED ORDER — DIPHENHYDRAMINE HCL 25 MG PO CAPS
50.0000 mg | ORAL_CAPSULE | Freq: Once | ORAL | Status: AC
Start: 2021-04-27 — End: 2021-04-27
  Administered 2021-04-27: 50 mg via ORAL
  Filled 2021-04-27: qty 2

## 2021-04-27 MED ORDER — OXYCODONE HCL 5 MG PO TABS: ORAL_TABLET | ORAL | 0 refills | Status: DC

## 2021-04-27 MED ORDER — ACETAMINOPHEN 325 MG PO TABS
650.0000 mg | ORAL_TABLET | Freq: Once | ORAL | Status: AC
  Administered 2021-04-27: 650 mg via ORAL
  Filled 2021-04-27: qty 2

## 2021-04-27 MED ORDER — OXYCODONE HCL 5 MG PO TABS
5.0000 mg | ORAL_TABLET | Freq: Once | ORAL | Status: AC
  Administered 2021-04-27: 5 mg via ORAL
  Filled 2021-04-27: qty 1

## 2021-04-27 NOTE — Patient Instructions (Signed)
[facility]  Discharge Instructions: Thank you for choosing [facility] to provide your oncology and hematology care.  If you have a lab appointment with the Cancer Center, please go directly to the Cancer Center and check in at the registration area.  Wear comfortable clothing and clothing appropriate for easy access to any Portacath or PICC line.   We strive to give you quality time with your provider. You may need to reschedule your appointment if you arrive late (15 or more minutes).  Arriving late affects you and other patients whose appointments are after yours.  Also, if you miss three or more appointments without notifying the office, you may be dismissed from the clinic at the provider's discretion.      For prescription refill requests, have your pharmacy contact our office and allow 72 hours for refills to be completed.    Today you received the following chemotherapy and/or immunotherapy agents       To help prevent nausea and vomiting after your treatment, we encourage you to take your nausea medication as directed.  BELOW ARE SYMPTOMS THAT SHOULD BE REPORTED IMMEDIATELY: *FEVER GREATER THAN 100.4 F (38 C) OR HIGHER *CHILLS OR SWEATING *NAUSEA AND VOMITING THAT IS NOT CONTROLLED WITH YOUR NAUSEA MEDICATION *UNUSUAL SHORTNESS OF BREATH *UNUSUAL BRUISING OR BLEEDING *URINARY PROBLEMS (pain or burning when urinating, or frequent urination) *BOWEL PROBLEMS (unusual diarrhea, constipation, pain near the anus) TENDERNESS IN MOUTH AND THROAT WITH OR WITHOUT PRESENCE OF ULCERS (sore throat, sores in mouth, or a toothache) UNUSUAL RASH, SWELLING OR PAIN  UNUSUAL VAGINAL DISCHARGE OR ITCHING   Items with * indicate a potential emergency and should be followed up as soon as possible or go to the Emergency Department if any problems should occur.  Please show the CHEMOTHERAPY ALERT CARD or IMMUNOTHERAPY ALERT CARD at check-in to the Emergency  Department and triage nurse.  Should you have questions after your visit or need to cancel or reschedule your appointment, please contact [facility]  [phone] and follow the prompts.  Office hours are 8:00 a.m. to 4:30 p.m. Monday - Friday. Please note that voicemails left after 4:00 p.m. may not be returned until the following business day.  We are closed weekends and major holidays. You have access to a nurse at all times for urgent questions. Please call the main number to the clinic [phone] and follow the prompts.  For any non-urgent questions, you may also contact your provider using MyChart. We now offer e-Visits for anyone 18 and older to request care online for non-urgent symptoms. For details visit [email or web address].   Also download the MyChart app! Go to the app store, search "MyChart", open the app, select [facility], and log in with your MyChart username and password.  Due to Covid, a mask is required upon entering the hospital/clinic. If you do not have a mask, one will be given to you upon arrival. For doctor visits, patients may have 1 support person aged 18 or older with them. For treatment visits, patients cannot have anyone with them due to current Covid guidelines and our immunocompromised population.  

## 2021-04-27 NOTE — Progress Notes (Signed)
Hematology/Oncology Consult note [facility]  [phone] [phone]  Patient Care Team: Burnard Hawthorne, FNP as PCP - General (Family Medicine) Rico Junker, RN as Oncology Nurse Navigator   Name of the patient: Tricia Berry  [id number]  [birth year]   Date of visit: 04/27/21  Diagnosis- invasive mammary carcinoma of the left breast stage III ypT2 ypN2 cM0 ER/PR positive and HER2 positive by [facility]  Chief complaint/ Reason for visit-on treatment assessment prior to cycle 1 of adjuvant HP chemotherapy  Heme/Onc history: patient is a 50 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  She then noticed that her lump is gradually getting larger with an area of ulceration on the skin and underwent a diagnostic bilateral mammogram on 09/08/2020 which showed hypoechoic interconnected masses in the left breast from 10:00 to 2 o'clock position spanning at least an area of 4.8 cm.  Ultrasound also showed 5 abnormal lymph nodes in the axilla with cortical thickening.  Both the mass and the lymph nodes were biopsied and was consistent with invasive mammary carcinoma grade 2.  Tumor was ER +91- 100%, PR +11 to 20% and HER-2 negative.  Ki-67 15%. Patient will be meeting [name] from [facility] surgery to discuss surgical management.  She is here for medical oncology recommendations.  In terms of her general health patient is otherwise doing well and does not have significant comorbidities.  She does endorse significant pain in the area of her left breast.  Tylenol and Motrin has not been helping her with this pain.  She lives with her husband and 2 children who have special needs.  No prior history of abnormal breast mammograms or breast biopsies.   PET CT scan showed hypermetabolism in the  area of the left breast but no evidence of hypermetabolism in the left axilla Or evidence of distant metastatic disease.   Neoadjuvant dose dense ACT chemotherapy started on 10/08/2020. Interim ultrasound after 4 cycles of dose dense AC chemotherapy showed overall decrease in volume of the tumor.  Nodularity previously seen on ultrasound was also decreased in size.   Patient completed neoadjuvant AC Taxol chemotherapy and underwent bilateral mastectomy with reconstruction.Final pathology showed fibrocystic changes in the right breast with no malignancy.  3.6 cm invasive mammary carcinoma in the left breast.  7 out of 16 lymph nodes positive for malignancy.  Treatment effect in the breast present but not robust.  Treatment effect in the lymph nodes minimal.  Margins negative.  Overall grade 2.  Extranodal extension present.  ER greater than 90% positive, PR 15% positive.  HER2 +2 equivocal and positive by [facility]  Interval history-patient was seen by Dr. Janese Banks for second opinion yesterday.  She continues to have chest wall pain and also reports occasional pain in her bilateral feet and hands.  ECOG PS- 1 Pain scale- 3   Review of systems- Review of Systems  Constitutional:  Negative for chills, fever, malaise/fatigue and weight loss.  HENT:  Negative for congestion, ear discharge and nosebleeds.   Eyes:  Negative for blurred vision.  Respiratory:  Negative for cough, hemoptysis, sputum production, shortness of breath and wheezing.   Cardiovascular:  Negative for chest pain, palpitations, orthopnea and claudication.  Gastrointestinal:  Negative for abdominal pain, blood in stool, constipation, diarrhea, heartburn, melena,  nausea and vomiting.  Genitourinary:  Negative for dysuria, flank pain, frequency, hematuria and urgency.  Musculoskeletal:  Negative for back pain, joint pain and myalgias.       Chest wall pain  Skin:  Negative for rash.  Neurological:  Positive for sensory change (Peripheral  neuropathy). Negative for dizziness, tingling, focal weakness, seizures, weakness and headaches.  Endo/Heme/Allergies:  Does not bruise/bleed easily.  Psychiatric/Behavioral:  Negative for depression and suicidal ideas. The patient does not have insomnia.       Allergies  Allergen Reactions   Morphine Nausea And Vomiting and Other (See Comments)    migranes Other reaction(s): Headache Migraine, vomiting   Sertraline Hcl     REACTION: Worsened symptoms of IBS   Sulfa Antibiotics Rash   Sulfamethoxazole Rash   Sulfonamide Derivatives Rash     Past Medical History:  Diagnosis Date   Anxiety    Breast cancer ([place])    Diverticulitis    Family history of ovarian cancer    GERD (gastroesophageal reflux disease)    IBS (irritable bowel syndrome)    PONV (postoperative nausea and vomiting)    severe migraine and vomiting post anesthesia   Scoliosis      Past Surgical History:  Procedure Laterality Date   ABDOMINAL HYSTERECTOMY     still has ovaries, no gyn cancer, hysterectomy due to endometriosis. NO cervix on exam 01/10/21   BREAST BIOPSY Left 09/15/2020   [place] bx of mass, path pending, Q marker   BREAST BIOPSY Left 09/15/2020   [place] bx of LN, hydromarker, path pending   BREAST RECONSTRUCTION WITH PLACEMENT OF TISSUE EXPANDER AND FLEX HD (ACELLULAR HYDRATED DERMIS) Bilateral 03/17/2021   Procedure: IMMEDIATE BILATERAL BREAST RECONSTRUCTION WITH PLACEMENT OF TISSUE EXPANDER AND FLEX HD (ACELLULAR HYDRATED DERMIS);  Surgeon: Wallace Going, DO;  Location: [place];  Service: Plastics;  Laterality: Bilateral;   IR IMAGING GUIDED PORT INSERTION  10/01/2020   MODIFIED MASTECTOMY Left 03/17/2021   Procedure: LEFT MODIFIED RADICAL MASTECTOMY;  Surgeon: Erroll Luna, MD;  Location: [place];  Service: General;  Laterality: Left;   PORTA CATH REMOVAL Right 03/17/2021   Procedure: PORTA CATH REMOVAL;  Surgeon: Erroll Luna, MD;  Location: [place];  Service: General;  Laterality: Right;   TOTAL MASTECTOMY Right 03/17/2021   Procedure: TOTAL MASTECTOMY;  Surgeon: Erroll Luna, MD;  Location: [place];  Service: General;  Laterality: Right;    Social History   Socioeconomic History   Marital status: Divorced    Spouse name: Not on file   Number of children: 2   Years of education: Not on file   Highest education level: Not on file  Occupational History   Occupation: Nurse    Employer: [employer]  Tobacco Use   Smoking status: Every Day    Packs/day: 0.25    Pack years: 0.00    Types: Cigarettes   Smokeless tobacco: Never  Vaping Use   Vaping Use: Never used  Substance and Sexual Activity   Alcohol use: Not Currently   Drug use: No   Sexual activity: Not Currently    Birth control/protection: None  Other Topics Concern   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 She and her spouse are primary caregivers.    Social Determinants of Health   Financial Resource Strain: Not on file  Food Insecurity: Not on file  Transportation Needs: Not on file  Physical Activity: Not on file  Stress: Not on file  Social Connections: Not on file  Intimate Partner Violence: Not on file    Family History  Problem Relation Age of Onset   Hypertension Mother    Osteoarthritis Mother    Diverticulitis Mother    Heart failure Father    Hypertension Father    Gout Father    Diverticulitis Brother    Ovarian cancer Paternal Grandmother      Current Outpatient Medications:    diazepam (VALIUM) 2 MG tablet, Take 1 tablet (2 mg total) by mouth every 12 (twelve) hours as needed for muscle spasms., Disp: 20 tablet, Rfl: 1   DULoxetine (CYMBALTA) 60 MG capsule, Take 1 capsule (60 mg total) by mouth daily., Disp: 90 capsule, Rfl: 3   Lactobacillus (PROBIOTIC ACIDOPHILUS PO), Take 1 capsule by mouth daily., Disp:  , Rfl:    Multiple Vitamins-Minerals (MULTIVITAL PO), Take 1 Dose by mouth daily., Disp: , Rfl:    nicotine (NICODERM CQ - DOSED IN MG/24 HOURS) 21 mg/24hr patch, PLACE 1 PATCH (21 MG TOTAL) ONTO THE SKIN DAILY., Disp: 28 patch, Rfl: 0   nicotine (NICOTROL) 10 MG inhaler, FOLLOW INSTRUCTIONS ON PACKAGE AS NEEDED FOR SMOKING CESSATION, Disp: 168 each, Rfl: 0   nicotine polacrilex (SM NICOTINE POLACRILEX) 4 MG lozenge, take as directed on package, Disp: 72 lozenge, Rfl: 0   ondansetron (ZOFRAN) 4 MG tablet, Take 1 tablet (4 mg total) by mouth every 8 (eight) hours as needed for nausea or vomiting., Disp: 20 tablet, Rfl: 0   ondansetron (ZOFRAN) 8 MG tablet, Take by mouth., Disp: , Rfl:    oxyCODONE (OXY IR/ROXICODONE) 5 MG immediate release tablet, 1-2 tablets every 6 hours as needed for pain, Disp: 240 tablet, Rfl: 0   traZODone (DESYREL) 50 MG tablet, Take 0.5-1 tablets (25-50 mg total) by mouth at bedtime as needed for sleep., Disp: 30 tablet, Rfl: 3 No current facility-administered medications for this visit.  Facility-Administered Medications Ordered in Other Visits:    heparin lock flush 100 unit/mL, 500 Units, Intravenous, Once, Sindy Guadeloupe, MD   pertuzumab (PERJETA) 420 mg in sodium chloride 0.9 % 250 mL chemo infusion, 420 mg, Intravenous, Once, Sindy Guadeloupe, MD   trastuzumab-dkst (OGIVRI) 450 mg in sodium chloride 0.9 % 250 mL chemo infusion, 450 mg, Intravenous, Once, Sindy Guadeloupe, MD, Last Rate: 181 mL/hr at 04/27/21 1124, 450 mg at 04/27/21 1124  Physical exam:  Vitals:   04/27/21 0925  BP: 118/84  Pulse: 84  Resp: 20  Temp: (!) 96.1 F (35.6 C)  TempSrc: Tympanic  SpO2: 100%  Weight: 116 lb 10.7 oz (52.9 kg)   Physical Exam Constitutional:      General: She is not in acute distress. Cardiovascular:     Rate and Rhythm: Normal rate and regular rhythm.     Heart sounds: Normal heart sounds.  Pulmonary:     Effort: Pulmonary effort is normal.     Breath sounds:  Normal breath sounds.  Skin:    General: Skin is warm and dry.  Neurological:     Mental Status: She is alert and oriented to person, place, and time.     CMP Latest Ref Rng & Units 04/27/2021  Glucose 70 - 99 mg/dL 114(H)  BUN 6 - 20 mg/dL <5(L)  Creatinine 0.44 - 1.00 mg/dL 0.72  Sodium 135 - 145 mmol/L 137  Potassium  3.5 - 5.1 mmol/L 4.0  Chloride 98 - 111 mmol/L 103  CO2 22 - 32 mmol/L 25  Calcium 8.9 - 10.3 mg/dL 9.4  Total Protein 6.5 - 8.1 g/dL 7.0  Total Bilirubin 0.3 - 1.2 mg/dL 0.5  Alkaline Phos 38 - 126 U/L 92  AST 15 - 41 U/L 25  ALT 0 - 44 U/L 17   CBC Latest Ref Rng & Units 04/27/2021  WBC 4.0 - 10.5 K/uL 9.0  Hemoglobin 12.0 - 15.0 g/dL 14.7  Hematocrit 36.0 - 46.0 % 44.7  Platelets 150 - 400 K/uL 254    No images are attached to the encounter.  NM PET Image Restag (PS) Skull Base To Thigh  Result Date: 04/18/2021 CLINICAL DATA:  Subsequent treatment strategy for left breast cancer. EXAM: NUCLEAR MEDICINE PET SKULL BASE TO THIGH TECHNIQUE: 6.0 mCi F-18 FDG was injected intravenously. Full-ring PET imaging was performed from the skull base to thigh after the radiotracer. CT data was obtained and used for attenuation correction and anatomic localization. Fasting blood glucose: 93 mg/dl COMPARISON:  Multiple exams, including 09/29/2020 FINDINGS: Mediastinal blood pool activity: SUV max 2.4 Liver activity: SUV max NA NECK: Symmetric bilateral palatine tonsillar activity, likely physiologic. Incidental CT findings: none CHEST: Bilateral breast tissue expanders were placed on 03/17/2021 at the time of bilateral mastectomy, with accentuated metabolic activity along the margins of the tissue expanders which is not unexpected 1 month out from surgery. None of the regions of hypermetabolic activity correspond to particularly suspicious appearing lesions on the CT data. Index accentuated activity along the upper lateral margin of the right pectoralis muscle adjacent to the tissue  expander with maximum SUV of 4.5. I do not see any individually hypermetabolic lymph nodes. There is some low-grade metabolic activity along the site of the left lymph node dissection, maximum SUV in this vicinity about 4.3, without visible worrisome residual lymph node. Incidental CT findings: Postoperative findings related to the tissue expanders and mastectomy, with some hypodensity likely representing postoperative fluid with marginal granulation tissue along the inferolateral margin of the left tissue expander. Mild subsegmental atelectasis inferiorly in the lingula. ABDOMEN/PELVIS: Physiologic activity in bowel. Incidental CT findings: Aortoiliac atherosclerotic vascular disease. Sigmoid colon diverticulosis. SKELETON: No significant abnormal hypermetabolic activity in this region. Incidental CT findings: S shaped thoracolumbar scoliosis. IMPRESSION: 1. Status post bilateral mastectomy with bilateral subpectoral tissue expanders in place. Accentuated metabolic activity along the margins of the tissue expanders not unexpected 1 month postoperative. No specific indicators of active malignancy. 2. Other imaging findings of potential clinical significance: Aortic Atherosclerosis (ICD10-I70.0). Sigmoid colon diverticulosis. Thoracolumbar scoliosis. Mild atelectasis inferiorly in the lingula. Electronically Signed   By: Van Clines M.D.   On: 04/18/2021 16:13     Assessment and plan- Patient is a 50 y.o. female with known history of locally advanced left breast cancer which was ER/PR positive and HER2 negative on initial biopsy s/p neoadjuvant AC Taxol chemotherapy.  Final pathology showed 3.6 cm residual tumor with 7 positive lymph nodes that was HER2 positive.  She is here for on treatment assessment prior to cycle 1 of adjuvant Herceptin and Perjeta  Counts okay to proceed with cycle 1 of adjuvant Herceptin and Perjeta today.  She has had baseline echocardiogram prior to starting anthracycline  chemotherapy which showed a normal EF.  We will also be repeating another echocardiogram this week.  At this time I am awaiting formal consensus opinion from [place].  Her 2 stain has also been ordered on the  lymph node specimen.  If lymph nodes are also positive for her to reference will be given for Kadcyla assuming that her residual disease is significantly HER2 driven.   Patient had her port taken out at the time of surgery as there was no adjuvant chemotherapy planned for HER2 negative disease.  But if patient needs adjuvant Kadcyla she will need port to be placed again.  Patient has undergone hysterectomy in the past but has ovaries in situ.  Hormone levels were checked after 2 cycles of AC chemotherapy and were postmenopausal.  However her menopausal status cannot be confirmed since she could have achieved chemo induced menopause at that time.  I will therefore proceed with ovarian suppression with Zoladex monthly injections.  Patient would like to get a bilateral oophorectomy done down the line given family history of ovarian cancer although her genetic testing was negative.  In addition to Zoladex patient would also benefit from starting aromatase inhibitors.  I will plan to start that after she receives 2-3 doses of Zoladex.  I will also work on Government social research officer for adjuvant abemaciclib given her high risk node positive disease but again at this time I will await further recommendations from [facility] after HER2 testing on lymph node is back.  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  She is also taking as needed oxycodone I have asked her to slowly cut down on the dose of oxycodone and eventually wean off in the next 3 to 4 months.  I will  reassess her symptoms in 3 weeks and consider adding gabapentin or Lyrica  I will tentatively see her back in 3 weeks for cycle 2 of Herceptin and Perjeta versus Kadcyla   Visit Diagnosis 1. Malignant neoplasm of upper-outer quadrant of left breast in female, estrogen receptor positive ([place])   2. Encounter for monoclonal antibody treatment for malignancy   3. Chemotherapy-induced peripheral neuropathy ([place])      Dr. Randa Evens, MD, MPH [facility] [phone] 04/27/2021 12:15 PM

## 2021-04-27 NOTE — Progress Notes (Signed)
Per MD will give herceptin loading dose today, Perjeta 420mg  today. Next infusion will give perjeta loading dose.

## 2021-04-28 ENCOUNTER — Ambulatory Visit
Admission: RE | Admit: 2021-04-28 | Discharge: 2021-04-28 | Disposition: A | Discharge status: Home / Self Care | Payer: No Typology Code available for payment source | Source: Ambulatory Visit | Attending: Oncology | Admitting: Oncology

## 2021-04-28 ENCOUNTER — Ambulatory Visit: Payer: No Typology Code available for payment source

## 2021-04-28 DIAGNOSIS — Z0189 Encounter for other specified special examinations: Secondary | ICD-10-CM | POA: Diagnosis not present

## 2021-04-28 DIAGNOSIS — Z170 Estrogen receptor positive status [ER+]: Secondary | ICD-10-CM | POA: Insufficient documentation

## 2021-04-28 DIAGNOSIS — C50412 Malignant neoplasm of upper-outer quadrant of left female breast: Secondary | ICD-10-CM | POA: Diagnosis not present

## 2021-04-28 DIAGNOSIS — Z01810 Encounter for preprocedural cardiovascular examination: Secondary | ICD-10-CM | POA: Diagnosis present

## 2021-04-28 LAB — ECHOCARDIOGRAM COMPLETE
AR max vel: 2.25 cm2
AV Area VTI: 2.27 cm2
AV Area mean vel: 2.21 cm2
AV Mean grad: 5 mmHg
AV Peak grad: 8.4 mmHg
Ao pk vel: 1.45 m/s
Area-P 1/2: 4.44 cm2
MV VTI: 3.26 cm2
S' Lateral: 2.5 cm

## 2021-04-28 NOTE — Progress Notes (Signed)
*  PRELIMINARY RESULTS* Echocardiogram 2D Echocardiogram has been performed.  Tricia Berry 04/28/2021, 10:40 AM

## 2021-04-29 ENCOUNTER — Ambulatory Visit (INDEPENDENT_AMBULATORY_CARE_PROVIDER_SITE_OTHER): Payer: No Typology Code available for payment source | Admitting: Surgical

## 2021-04-29 ENCOUNTER — Other Ambulatory Visit: Payer: Self-pay

## 2021-04-29 ENCOUNTER — Ambulatory Visit: Payer: No Typology Code available for payment source

## 2021-04-29 ENCOUNTER — Encounter: Payer: Self-pay | Admitting: Surgical

## 2021-04-29 ENCOUNTER — Telehealth: Payer: Self-pay

## 2021-04-29 DIAGNOSIS — C50412 Malignant neoplasm of upper-outer quadrant of left female breast: Secondary | ICD-10-CM

## 2021-04-29 DIAGNOSIS — Z170 Estrogen receptor positive status [ER+]: Secondary | ICD-10-CM

## 2021-04-29 MED ORDER — CEPHALEXIN 500 MG PO CAPS: 500.0000 mg | ORAL_CAPSULE | Freq: Four times a day (QID) | ORAL | 0 refills | Status: AC

## 2021-04-29 MED ORDER — ONDANSETRON HCL 4 MG PO TABS: 4.0000 mg | ORAL_TABLET | Freq: Three times a day (TID) | ORAL | 0 refills | Status: DC | PRN

## 2021-04-29 MED ORDER — OXYCODONE-ACETAMINOPHEN 7.5-325 MG PO TABS: 1.0000 | ORAL_TABLET | Freq: Three times a day (TID) | ORAL | 0 refills | Status: AC | PRN

## 2021-04-29 NOTE — H&P (View-Only) (Signed)
Patient ID: Tricia Berry, female    DOB: June 28, 1971, 50 y.o.   MRN: [id number]  Chief Complaint  Patient presents with   Follow-up      ICD-10-CM   1. Malignant neoplasm of upper-outer quadrant of left breast in female, estrogen receptor positive ([place])  C50.412    Z17.0        History of Present Illness: Tricia Berry is a 50 y.o.  female  with a history of bilateral mastectomies.  She presents for preoperative evaluation for upcoming procedure, removal of bilateral breast tissue expanders with placement of bilateral breast implants, scheduled for 05/09/2021 with Dr. Marla Roe.  History of postoperative nausea vomiting after anesthesia.  No other complications of anesthesia. No history of DVT/PE.  No family history of DVT/PE.  No family or personal history of bleeding or clotting disorders.  Patient is not currently taking any blood thinners.  No history of CVA/MI.   PMH Significant for: Tobacco abuse, insomnia, depression, anxiety, chronic oxycodone 5 mg as needed for chronic pain.  She is a current every day smoker.  She is scheduled for radiation of her left breast as soon as possible after surgery.  They are planning for radiation 3 weeks after surgery.  She is currently undergoing Herceptin injections.  She is having injections every 3 weeks, recent injection was Wednesday of this week.  She currently has 300 out of 455 cc in bilateral breast expanders.  She reports today that she is interested in saline implants.  Past Medical History: Allergies: Allergies  Allergen Reactions   Morphine Nausea And Vomiting and Other (See Comments)    migranes Other reaction(s): Headache Migraine, vomiting   Sertraline Hcl     REACTION: Worsened symptoms of IBS   Sulfa Antibiotics Rash   Sulfamethoxazole Rash   Sulfonamide Derivatives Rash    Current Medications:  Current Outpatient Medications:    cephALEXin (KEFLEX) 500 MG capsule, Take 1 capsule (500 mg total)  by mouth 4 (four) times daily for 5 days., Disp: 20 capsule, Rfl: 0   diazepam (VALIUM) 2 MG tablet, Take 1 tablet (2 mg total) by mouth every 12 (twelve) hours as needed for muscle spasms., Disp: 20 tablet, Rfl: 1   DULoxetine (CYMBALTA) 60 MG capsule, Take 1 capsule (60 mg total) by mouth daily., Disp: 90 capsule, Rfl: 3   Lactobacillus (PROBIOTIC ACIDOPHILUS PO), Take 1 capsule by mouth daily., Disp: , Rfl:    Multiple Vitamins-Minerals (MULTIVITAL PO), Take 1 Dose by mouth daily., Disp: , Rfl:    nicotine (NICODERM CQ - DOSED IN MG/24 HOURS) 21 mg/24hr patch, PLACE 1 PATCH (21 MG TOTAL) ONTO THE SKIN DAILY., Disp: 28 patch, Rfl: 0   nicotine (NICOTROL) 10 MG inhaler, FOLLOW INSTRUCTIONS ON PACKAGE AS NEEDED FOR SMOKING CESSATION, Disp: 168 each, Rfl: 0   nicotine polacrilex (SM NICOTINE POLACRILEX) 4 MG lozenge, take as directed on package, Disp: 72 lozenge, Rfl: 0   ondansetron (ZOFRAN) 4 MG tablet, Take 1 tablet (4 mg total) by mouth every 8 (eight) hours as needed for nausea or vomiting., Disp: 20 tablet, Rfl: 0   oxyCODONE (OXY IR/ROXICODONE) 5 MG immediate release tablet, 1-2 tablets every 6 hours as needed for pain, Disp: 240 tablet, Rfl: 0   oxyCODONE-acetaminophen (PERCOCET) 7.5-325 MG tablet, Take 1 tablet by mouth every 8 (eight) hours as needed for up to 5 days for severe pain., Disp: 15 tablet, Rfl: 0   ondansetron (ZOFRAN) 8 MG tablet, Take by mouth.,  Disp: , Rfl:    traZODone (DESYREL) 50 MG tablet, Take 0.5-1 tablets (25-50 mg total) by mouth at bedtime as needed for sleep. (Patient not taking: Reported on 04/29/2021), Disp: 30 tablet, Rfl: 3 No current facility-administered medications for this visit.  Facility-Administered Medications Ordered in Other Visits:    heparin lock flush 100 unit/mL, 500 Units, Intravenous, Once, Sindy Guadeloupe, MD  Past Medical Problems: Past Medical History:  Diagnosis Date   Anxiety    Breast cancer ([place])    Diverticulitis    Family history of  ovarian cancer    GERD (gastroesophageal reflux disease)    IBS (irritable bowel syndrome)    PONV (postoperative nausea and vomiting)    severe migraine and vomiting post anesthesia   Scoliosis     Past Surgical History: Past Surgical History:  Procedure Laterality Date   ABDOMINAL HYSTERECTOMY     still has ovaries, no gyn cancer, hysterectomy due to endometriosis. NO cervix on exam 01/10/21   BREAST BIOPSY Left 09/15/2020   [place] bx of mass, path pending, Q marker   BREAST BIOPSY Left 09/15/2020   [place] bx of LN, hydromarker, path pending   BREAST RECONSTRUCTION WITH PLACEMENT OF TISSUE EXPANDER AND FLEX HD (ACELLULAR HYDRATED DERMIS) Bilateral 03/17/2021   Procedure: IMMEDIATE BILATERAL BREAST RECONSTRUCTION WITH PLACEMENT OF TISSUE EXPANDER AND FLEX HD (ACELLULAR HYDRATED DERMIS);  Surgeon: Wallace Going, DO;  Location: [facility];  Service: Plastics;  Laterality: Bilateral;   IR IMAGING GUIDED PORT INSERTION  10/01/2020   MODIFIED MASTECTOMY Left 03/17/2021   Procedure: LEFT MODIFIED RADICAL MASTECTOMY;  Surgeon: Erroll Luna, MD;  Location: [facility];  Service: General;  Laterality: Left;   PORTA CATH REMOVAL Right 03/17/2021   Procedure: PORTA CATH REMOVAL;  Surgeon: Erroll Luna, MD;  Location: [facility];  Service: General;  Laterality: Right;   TOTAL MASTECTOMY Right 03/17/2021   Procedure: TOTAL MASTECTOMY;  Surgeon: Erroll Luna, MD;  Location: [facility];  Service: General;  Laterality: Right;    Social History: Social History   Socioeconomic History   Marital status: Divorced    Spouse name: Not on file   Number of children: 2   Years of education: Not on file   Highest education level: Not on file  Occupational History   Occupation: Nurse    Employer: [employer]  Tobacco Use   Smoking status: Every Day    Packs/day: 0.25    Pack years: 0.00    Types: Cigarettes   Smokeless tobacco: Never   Vaping Use   Vaping Use: Never used  Substance and Sexual Activity   Alcohol use: Not Currently   Drug use: No   Sexual activity: Not Currently    Birth control/protection: None  Other Topics Concern   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 She and her spouse are primary caregivers.    Social Determinants of Health   Financial Resource Strain: Not on file  Food Insecurity: Not on file  Transportation Needs: Not on file  Physical Activity: Not on file  Stress: Not on file  Social Connections: Not on file  Intimate Partner Violence: Not on file    Family History: Family History  Problem Relation Age of Onset   Hypertension Mother    Osteoarthritis Mother    Diverticulitis Mother    Heart failure  Father    Hypertension Father    Gout Father    Diverticulitis Brother    Ovarian cancer Paternal Grandmother     Review of Systems: Review of Systems  Constitutional: Negative.   Respiratory: Negative.    Cardiovascular: Negative.   Gastrointestinal:  Positive for nausea and vomiting.  Neurological: Negative.    Physical Exam: Vital Signs There were no vitals taken for this visit.  Physical Exam Constitutional:      General: Not in acute distress.    Appearance: Normal appearance. Not ill-appearing.  HENT:     Head: Normocephalic and atraumatic.  Eyes:     Pupils: Pupils are equal, round Neck:     Musculoskeletal: Normal range of motion.  Cardiovascular:     Rate and Rhythm: Normal rate    Pulses: Normal pulses.  Pulmonary:     Effort: Pulmonary effort is normal. No respiratory distress.  Abdominal:     General: Abdomen is flat. There is no distension.  Musculoskeletal: Normal range of motion.  Skin:    General: Skin is warm and dry.     Findings: No erythema or rash.  Neurological:     General: No focal deficit present.     Mental Status: Alert and oriented to  person, place, and time. Mental status is at baseline.     Motor: No weakness.  Psychiatric:        Mood and Affect: Mood normal.        Behavior: Behavior normal.    Assessment/Plan: The patient is scheduled for exchange of bilateral breast expanders for bilateral breast implants  with Dr. Marla Roe.  Risks, benefits, and alternatives of procedure discussed, questions answered and consent obtained.    Smoking Status: Current smoker, half pack per day; Counseling Given?  Patient is aware of increased risk of postoperative complications including wound healing complications, increased risk of infection and increased risk of skin necrosis  Last Mammogram: S/P bilateral mastectomy  Caprini Score: 8, high; Risk Factors include: Age, current smoker, history of breast cancer, Port-A-Cath in place and length of planned surgery. Recommendation for mechanical and pharmacological prophylaxis. Encourage early ambulation.  May require 1 week of postoperative Lovenox, will discuss with surgical team  Pictures obtained: At previous visit  Post-op Rx sent to pharmacy: Percocet, Zofran, Keflex.  Patient is on chronic oxycodone 5 mg as needed for chronic pain.  She she understands not to take postop pain medications in conjunction with oxycodone.  We have previously received clearance from patient's pain management provider.  Patient was provided with the General Surgical Risk consent document and Pain Medication Agreement prior to their appointment.  They had adequate time to read through the risk consent documents and Pain Medication Agreement. We also discussed them in person together during this preop appointment. All of their questions were answered to their satisfaction.  Recommended calling if they have any further questions.  Risk consent form and Pain Medication Agreement to be scanned into patient's chart.  Patient was provided with the Mentor implant patient decision checklist and this was completed  during today's preoperative evaluation. Patient had time to read through the information and any questions were answered to their content. Form will be scanned into patient's chart.  The risks that can be encountered with and after placement of a breast implant were discussed and include the following but not limited to these: bleeding, infection, delayed healing, anesthesia risks, skin sensation changes, injury to structures including nerves, blood vessels, and muscles which  may be temporary or permanent, allergies to tape, suture materials and glues, blood products, topical preparations or injected agents, skin contour irregularities, skin discoloration and swelling, deep vein thrombosis, cardiac and pulmonary complications, pain, which may persist, fluid accumulation, wrinkling of the skin over the implanmt, changes in nipple or breast sensation, implant leakage or rupture, faulty position of the implant, persistent pain, formation of tight scar tissue around the implant (capsular contracture).   Electronically signed by: Carola Rhine Finnian Husted, PA-C 04/29/2021 12:31 PM

## 2021-04-29 NOTE — Telephone Encounter (Signed)
Ann called from the [facility] in [place] regarding the oxycodone e-script for this patient.  Please call.

## 2021-04-29 NOTE — Progress Notes (Signed)
Patient ID: Tricia Berry, female    DOB: January 30, 1971, 50 y.o.   MRN: [id number]  Chief Complaint  Patient presents with   Follow-up      ICD-10-CM   1. Malignant neoplasm of upper-outer quadrant of left breast in female, estrogen receptor positive ([place])  C50.412    Z17.0        History of Present Illness: Tricia Berry is a 50 y.o.  female  with a history of bilateral mastectomies.  She presents for preoperative evaluation for upcoming procedure, removal of bilateral breast tissue expanders with placement of bilateral breast implants, scheduled for 05/09/2021 with Dr. Marla Roe.  History of postoperative nausea vomiting after anesthesia.  No other complications of anesthesia. No history of DVT/PE.  No family history of DVT/PE.  No family or personal history of bleeding or clotting disorders.  Patient is not currently taking any blood thinners.  No history of CVA/MI.   PMH Significant for: Tobacco abuse, insomnia, depression, anxiety, chronic oxycodone 5 mg as needed for chronic pain.  She is a current every day smoker.  She is scheduled for radiation of her left breast as soon as possible after surgery.  They are planning for radiation 3 weeks after surgery.  She is currently undergoing Herceptin injections.  She is having injections every 3 weeks, recent injection was Wednesday of this week.  She currently has 300 out of 455 cc in bilateral breast expanders.  She reports today that she is interested in saline implants.  Past Medical History: Allergies: Allergies  Allergen Reactions   Morphine Nausea And Vomiting and Other (See Comments)    migranes Other reaction(s): Headache Migraine, vomiting   Sertraline Hcl     REACTION: Worsened symptoms of IBS   Sulfa Antibiotics Rash   Sulfamethoxazole Rash   Sulfonamide Derivatives Rash    Current Medications:  Current Outpatient Medications:    cephALEXin (KEFLEX) 500 MG capsule, Take 1 capsule (500 mg total)  by mouth 4 (four) times daily for 5 days., Disp: 20 capsule, Rfl: 0   diazepam (VALIUM) 2 MG tablet, Take 1 tablet (2 mg total) by mouth every 12 (twelve) hours as needed for muscle spasms., Disp: 20 tablet, Rfl: 1   DULoxetine (CYMBALTA) 60 MG capsule, Take 1 capsule (60 mg total) by mouth daily., Disp: 90 capsule, Rfl: 3   Lactobacillus (PROBIOTIC ACIDOPHILUS PO), Take 1 capsule by mouth daily., Disp: , Rfl:    Multiple Vitamins-Minerals (MULTIVITAL PO), Take 1 Dose by mouth daily., Disp: , Rfl:    nicotine (NICODERM CQ - DOSED IN MG/24 HOURS) 21 mg/24hr patch, PLACE 1 PATCH (21 MG TOTAL) ONTO THE SKIN DAILY., Disp: 28 patch, Rfl: 0   nicotine (NICOTROL) 10 MG inhaler, FOLLOW INSTRUCTIONS ON PACKAGE AS NEEDED FOR SMOKING CESSATION, Disp: 168 each, Rfl: 0   nicotine polacrilex (SM NICOTINE POLACRILEX) 4 MG lozenge, take as directed on package, Disp: 72 lozenge, Rfl: 0   ondansetron (ZOFRAN) 4 MG tablet, Take 1 tablet (4 mg total) by mouth every 8 (eight) hours as needed for nausea or vomiting., Disp: 20 tablet, Rfl: 0   oxyCODONE (OXY IR/ROXICODONE) 5 MG immediate release tablet, 1-2 tablets every 6 hours as needed for pain, Disp: 240 tablet, Rfl: 0   oxyCODONE-acetaminophen (PERCOCET) 7.5-325 MG tablet, Take 1 tablet by mouth every 8 (eight) hours as needed for up to 5 days for severe pain., Disp: 15 tablet, Rfl: 0   ondansetron (ZOFRAN) 8 MG tablet, Take by mouth.,  Disp: , Rfl:    traZODone (DESYREL) 50 MG tablet, Take 0.5-1 tablets (25-50 mg total) by mouth at bedtime as needed for sleep. (Patient not taking: Reported on 04/29/2021), Disp: 30 tablet, Rfl: 3 No current facility-administered medications for this visit.  Facility-Administered Medications Ordered in Other Visits:    heparin lock flush 100 unit/mL, 500 Units, Intravenous, Once, Sindy Guadeloupe, MD  Past Medical Problems: Past Medical History:  Diagnosis Date   Anxiety    Breast cancer ([place])    Diverticulitis    Family history of  ovarian cancer    GERD (gastroesophageal reflux disease)    IBS (irritable bowel syndrome)    PONV (postoperative nausea and vomiting)    severe migraine and vomiting post anesthesia   Scoliosis     Past Surgical History: Past Surgical History:  Procedure Laterality Date   ABDOMINAL HYSTERECTOMY     still has ovaries, no gyn cancer, hysterectomy due to endometriosis. NO cervix on exam 01/10/21   BREAST BIOPSY Left 09/15/2020   [place] bx of mass, path pending, Q marker   BREAST BIOPSY Left 09/15/2020   [place] bx of LN, hydromarker, path pending   BREAST RECONSTRUCTION WITH PLACEMENT OF TISSUE EXPANDER AND FLEX HD (ACELLULAR HYDRATED DERMIS) Bilateral 03/17/2021   Procedure: IMMEDIATE BILATERAL BREAST RECONSTRUCTION WITH PLACEMENT OF TISSUE EXPANDER AND FLEX HD (ACELLULAR HYDRATED DERMIS);  Surgeon: Wallace Going, DO;  Location: [facility];  Service: Plastics;  Laterality: Bilateral;   IR IMAGING GUIDED PORT INSERTION  10/01/2020   MODIFIED MASTECTOMY Left 03/17/2021   Procedure: LEFT MODIFIED RADICAL MASTECTOMY;  Surgeon: Erroll Luna, MD;  Location: [facility];  Service: General;  Laterality: Left;   PORTA CATH REMOVAL Right 03/17/2021   Procedure: PORTA CATH REMOVAL;  Surgeon: Erroll Luna, MD;  Location: [facility];  Service: General;  Laterality: Right;   TOTAL MASTECTOMY Right 03/17/2021   Procedure: TOTAL MASTECTOMY;  Surgeon: Erroll Luna, MD;  Location: [facility];  Service: General;  Laterality: Right;    Social History: Social History   Socioeconomic History   Marital status: Divorced    Spouse name: Not on file   Number of children: 2   Years of education: Not on file   Highest education level: Not on file  Occupational History   Occupation: Nurse    Employer: [employer]  Tobacco Use   Smoking status: Every Day    Packs/day: 0.25    Pack years: 0.00    Types: Cigarettes   Smokeless tobacco: Never   Vaping Use   Vaping Use: Never used  Substance and Sexual Activity   Alcohol use: Not Currently   Drug use: No   Sexual activity: Not Currently    Birth control/protection: None  Other Topics Concern   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 She and her spouse are primary caregivers.    Social Determinants of Health   Financial Resource Strain: Not on file  Food Insecurity: Not on file  Transportation Needs: Not on file  Physical Activity: Not on file  Stress: Not on file  Social Connections: Not on file  Intimate Partner Violence: Not on file    Family History: Family History  Problem Relation Age of Onset   Hypertension Mother    Osteoarthritis Mother    Diverticulitis Mother    Heart failure  Father    Hypertension Father    Gout Father    Diverticulitis Brother    Ovarian cancer Paternal Grandmother     Review of Systems: Review of Systems  Constitutional: Negative.   Respiratory: Negative.    Cardiovascular: Negative.   Gastrointestinal:  Positive for nausea and vomiting.  Neurological: Negative.    Physical Exam: Vital Signs There were no vitals taken for this visit.  Physical Exam Constitutional:      General: Not in acute distress.    Appearance: Normal appearance. Not ill-appearing.  HENT:     Head: Normocephalic and atraumatic.  Eyes:     Pupils: Pupils are equal, round Neck:     Musculoskeletal: Normal range of motion.  Cardiovascular:     Rate and Rhythm: Normal rate    Pulses: Normal pulses.  Pulmonary:     Effort: Pulmonary effort is normal. No respiratory distress.  Abdominal:     General: Abdomen is flat. There is no distension.  Musculoskeletal: Normal range of motion.  Skin:    General: Skin is warm and dry.     Findings: No erythema or rash.  Neurological:     General: No focal deficit present.     Mental Status: Alert and oriented to  person, place, and time. Mental status is at baseline.     Motor: No weakness.  Psychiatric:        Mood and Affect: Mood normal.        Behavior: Behavior normal.    Assessment/Plan: The patient is scheduled for exchange of bilateral breast expanders for bilateral breast implants  with Dr. Marla Roe.  Risks, benefits, and alternatives of procedure discussed, questions answered and consent obtained.    Smoking Status: Current smoker, half pack per day; Counseling Given?  Patient is aware of increased risk of postoperative complications including wound healing complications, increased risk of infection and increased risk of skin necrosis  Last Mammogram: S/P bilateral mastectomy  Caprini Score: 8, high; Risk Factors include: Age, current smoker, history of breast cancer, Port-A-Cath in place and length of planned surgery. Recommendation for mechanical and pharmacological prophylaxis. Encourage early ambulation.  May require 1 week of postoperative Lovenox, will discuss with surgical team  Pictures obtained: At previous visit  Post-op Rx sent to pharmacy: Percocet, Zofran, Keflex.  Patient is on chronic oxycodone 5 mg as needed for chronic pain.  She she understands not to take postop pain medications in conjunction with oxycodone.  We have previously received clearance from patient's pain management provider.  Patient was provided with the General Surgical Risk consent document and Pain Medication Agreement prior to their appointment.  They had adequate time to read through the risk consent documents and Pain Medication Agreement. We also discussed them in person together during this preop appointment. All of their questions were answered to their satisfaction.  Recommended calling if they have any further questions.  Risk consent form and Pain Medication Agreement to be scanned into patient's chart.  Patient was provided with the Mentor implant patient decision checklist and this was completed  during today's preoperative evaluation. Patient had time to read through the information and any questions were answered to their content. Form will be scanned into patient's chart.  The risks that can be encountered with and after placement of a breast implant were discussed and include the following but not limited to these: bleeding, infection, delayed healing, anesthesia risks, skin sensation changes, injury to structures including nerves, blood vessels, and muscles which  may be temporary or permanent, allergies to tape, suture materials and glues, blood products, topical preparations or injected agents, skin contour irregularities, skin discoloration and swelling, deep vein thrombosis, cardiac and pulmonary complications, pain, which may persist, fluid accumulation, wrinkling of the skin over the implanmt, changes in nipple or breast sensation, implant leakage or rupture, faulty position of the implant, persistent pain, formation of tight scar tissue around the implant (capsular contracture).   Electronically signed by: Carola Rhine Augustus Zurawski, PA-C 04/29/2021 12:31 PM

## 2021-05-02 ENCOUNTER — Other Ambulatory Visit: Payer: Self-pay | Admitting: Surgical

## 2021-05-02 ENCOUNTER — Encounter: Payer: Self-pay | Admitting: Physical Therapy

## 2021-05-02 ENCOUNTER — Encounter (HOSPITAL_BASED_OUTPATIENT_CLINIC_OR_DEPARTMENT_OTHER): Payer: Self-pay | Admitting: Plastic Surgery

## 2021-05-02 ENCOUNTER — Other Ambulatory Visit: Payer: Self-pay

## 2021-05-02 ENCOUNTER — Ambulatory Visit: Payer: No Typology Code available for payment source | Attending: Surgery | Admitting: Physical Therapy

## 2021-05-02 DIAGNOSIS — M25612 Stiffness of left shoulder, not elsewhere classified: Secondary | ICD-10-CM | POA: Diagnosis present

## 2021-05-02 DIAGNOSIS — M25611 Stiffness of right shoulder, not elsewhere classified: Secondary | ICD-10-CM | POA: Diagnosis present

## 2021-05-02 DIAGNOSIS — Z483 Aftercare following surgery for neoplasm: Secondary | ICD-10-CM | POA: Insufficient documentation

## 2021-05-02 DIAGNOSIS — Z170 Estrogen receptor positive status [ER+]: Secondary | ICD-10-CM | POA: Insufficient documentation

## 2021-05-02 DIAGNOSIS — R293 Abnormal posture: Secondary | ICD-10-CM | POA: Insufficient documentation

## 2021-05-02 DIAGNOSIS — C50912 Malignant neoplasm of unspecified site of left female breast: Secondary | ICD-10-CM | POA: Diagnosis present

## 2021-05-02 MED ORDER — ENOXAPARIN SODIUM 40 MG/0.4ML IJ SOSY: 40.0000 mg | PREFILLED_SYRINGE | INTRAMUSCULAR | 0 refills | Status: DC

## 2021-05-02 NOTE — Patient Instructions (Signed)
Over Head Pull: Narrow and Wide Grip   [facility] [phone]   On back, knees bent, feet flat, band across thighs, elbows straight but relaxed. Pull hands apart (start). Keeping elbows straight, bring arms up and over head, hands toward floor. Keep pull steady on band. Hold momentarily. Return slowly, keeping pull steady, back to start. Then do same with a wider grip on the band (past shoulder width) Repeat _10__ times. Band color __yellow____   Side Pull: Double Arm   On back, knees bent, feet flat. Arms perpendicular to body, shoulder level, elbows straight but relaxed. Pull arms out to sides, elbows straight. Resistance band comes across collarbones, hands toward floor. Hold momentarily. Slowly return to starting position. Repeat _10__ times. Band color _yellow____   Sword   On back, knees bent, feet flat, left hand on left hip, right hand above left. Pull right arm DIAGONALLY (hip to shoulder) across chest. Bring right arm along head toward floor. Thumb is pointed down when by hip and rotates upwards when by shoulder. Hold momentarily. Slowly return to starting position. Repeat _10__ times. Do with left arm. Band color _yellow_____   Shoulder Rotation: Double Arm   On back, knees bent, feet flat, elbows tucked at sides, bent 90, hands palms up. Pull hands apart and down toward floor, keeping elbows near sides. Hold momentarily. Slowly return to starting position. Repeat _10__ times. Band color __yellow____

## 2021-05-02 NOTE — Therapy (Signed)
[place], [place], [address] Phone: [phone]   Fax:  [phone]  Physical Therapy Treatment  Patient Details  Name: Tricia Berry MRN: [id number] Date of Birth: [birth year] Referring Provider (PT): Cornett   Encounter Date: 05/02/2021   PT End of Session - 05/02/21 1354     Visit Number 5    Number of Visits 10    Date for PT Re-Evaluation 05/05/21    PT Start Time 2423    PT Stop Time 5361    PT Time Calculation (min) 50 min    Activity Tolerance Patient tolerated treatment well    Behavior During Therapy [facility] for tasks assessed/performed             Past Medical History:  Diagnosis Date   Anxiety    Breast cancer ([place])    Diverticulitis    Family history of ovarian cancer    GERD (gastroesophageal reflux disease)    IBS (irritable bowel syndrome)    PONV (postoperative nausea and vomiting)    severe migraine and vomiting post anesthesia   Scoliosis     Past Surgical History:  Procedure Laterality Date   ABDOMINAL HYSTERECTOMY     still has ovaries, no gyn cancer, hysterectomy due to endometriosis. NO cervix on exam 01/10/21   BREAST BIOPSY Left 09/15/2020   [place] bx of mass, path pending, Q marker   BREAST BIOPSY Left 09/15/2020   [place] bx of LN, hydromarker, path pending   BREAST RECONSTRUCTION WITH PLACEMENT OF TISSUE EXPANDER AND FLEX HD (ACELLULAR HYDRATED DERMIS) Bilateral 03/17/2021   Procedure: IMMEDIATE BILATERAL BREAST RECONSTRUCTION WITH PLACEMENT OF TISSUE EXPANDER AND FLEX HD (ACELLULAR HYDRATED DERMIS);  Surgeon: Wallace Going, DO;  Location: [place];  Service: Plastics;  Laterality: Bilateral;   IR IMAGING GUIDED PORT INSERTION  10/01/2020   MODIFIED MASTECTOMY Left 03/17/2021   Procedure: LEFT MODIFIED RADICAL MASTECTOMY;  Surgeon: Erroll Luna, MD;  Location: [place];  Service: General;  Laterality: Left;   PORTA CATH  REMOVAL Right 03/17/2021   Procedure: PORTA CATH REMOVAL;  Surgeon: Erroll Luna, MD;  Location: [place];  Service: General;  Laterality: Right;   TOTAL MASTECTOMY Right 03/17/2021   Procedure: TOTAL MASTECTOMY;  Surgeon: Erroll Luna, MD;  Location: [place];  Service: General;  Laterality: Right;    There were no vitals filed for this visit.   Subjective Assessment - 05/02/21 1304     Subjective I had to cancel my appointment on the 18th because I am having my implant placed. My shoulder is doing well.    Pertinent History L breast cancer ER+/PR+ HER2-, stage III, will undergo a bilateral mastectomy and ALND on L (7/16) on 03/17/21, neuropathy of hands, lower legs and feet    Patient Stated Goals to gain info from providers    Currently in Pain? No/denies    Pain Score 0-No pain                               [facility] Adult PT Treatment/Exercise - 05/02/21 0001       Shoulder Exercises: Supine   Horizontal ABduction Strengthening;Both;10 reps   pt returned therapist demo   Theraband Level (Shoulder Horizontal ABduction) Level 1 (Yellow)    External Rotation Strengthening;Both;10 reps   pt returned therapist demo   Theraband Level (Shoulder External Rotation) Level 1 (Yellow)  Flexion Strengthening;Both;10 reps   narrow and wide grip, pt returned therapist demo   Theraband Level (Shoulder Flexion) Level 1 (Yellow)    Diagonals Strengthening;Both;10 reps   pt returned therapist demo   Theraband Level (Shoulder Diagonals) Level 1 (Yellow)      Manual Therapy   Manual Lymphatic Drainage (MLD) in supine: 5 diaphragmatic breaths, left inguinal nodes then to R S/L for L lateral trunk/posterior axilla then retracing all steps                         PT Long Term Goals - 05/02/21 1305       PT LONG TERM GOAL #1   Title Pt will return to baseline shoulder ROM measurements and not demonstrate any signs or symptoms  of lymphedema.    Time 4    Period Weeks    Status Achieved      PT LONG TERM GOAL #2   Title Pt will demonstrate 165 degrees of bilateral flexion to allow pt to reach overhead.    Baseline R 143 L 101; 05/02/21- 172 on R, 175 on L    Time 4    Period Weeks    Status Achieved      PT LONG TERM GOAL #3   Title Pt will demonstrate 165 degrees of bilateral shoulder abduction to allow her to reach out the side    Baseline R 152 L 78; 05/02/21- R 180 L 179    Time 4    Period Weeks    Status Achieved      PT LONG TERM GOAL #4   Baseline 05/02/21- pt reports she is no longer having muscle spasms    Time 4    Period Weeks    Status Achieved      PT LONG TERM GOAL #5   Title Pt will be independent in a home exercise program for long term strengthening and stretching.    Time 4    Period Weeks    Status On-going      PT LONG TERM GOAL #6   Title Pt will report a 50% improvement in edema in L trunk and chest to allow improved comfort.    Baseline 05/02/21- 20% improved    Time 4    Period Weeks    Status On-going                   Plan - 05/02/21 1356     Clinical Impression Statement Instructed pt in supine strengthening exercises today and issued them in a handout so pt can add them to her home execise program. Attempted red to begin with but it was too strong so decreased to yellow. Continued with MLD to L lateral trunk though this area appears less swollen than last session. Pt had to cancel appointments for next week due to surgery to remove expanders and place implants.    PT Frequency 2x / week    PT Duration 4 weeks    PT Treatment/Interventions ADLs/Self Care Home Management;Therapeutic exercise;Patient/family education;Scar mobilization;Passive range of motion;Manual techniques;Manual lymph drainage;Compression bandaging;Taping;Vasopneumatic Device    PT Next Visit Plan assess indep with supine scaption exercises, HA, supine scap with yellow band, work on end range  stretching of L shoulder, MLD to L lateral trunk/axilla/anterior chest    PT Home Exercise Plan post op breast exercises, instructed pt in supine dowel, supine scap series    Consulted and Agree with Plan of Care  Patient             Patient will benefit from skilled therapeutic intervention in order to improve the following deficits and impairments:  Pain, Postural dysfunction, Decreased knowledge of precautions, Impaired UE functional use, Increased fascial restricitons, Decreased strength, Decreased range of motion, Decreased scar mobility, Increased edema, Increased muscle spasms  Visit Diagnosis: Stiffness of left shoulder, not elsewhere classified  Aftercare following surgery for neoplasm  Abnormal posture  Stiffness of right shoulder, not elsewhere classified  Malignant 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, estrogen receptor positive, unspecified site of breast [facility])     Problem List Patient Active Problem List   Diagnosis Date Noted   Breast cancer ([place]) 03/17/2021   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   Genetic testing 11/22/2020   Family history of ovarian cancer    Candidal vulvovaginitis 11/01/2020   Low back pain 10/06/2020   Encounter for medical examination to establish care 09/26/2020   Malignant neoplasm of upper-outer quadrant of left breast in female, estrogen receptor positive ([place]) 09/26/2020   BACK STRAIN, LUMBAR 03/08/2010   INTERNAL HEMORRHOIDS 01/27/2010   ANAL FISSURE 01/27/2010   OSTEOARTHRITIS, HANDS, BILATERAL 01/04/2010   ARTHRALGIA 10/05/2009   INSOMNIA, CHRONIC 05/06/2009   ALLERGIC RHINITIS, SEASONAL 02/09/2009   TOBACCO ABUSE 12/29/2008   IBS 10/13/2008   Anxiety state 08/25/2008   DEPRESSION, RECURRENT 08/25/2008    Allyson Sabal [facility] 05/02/2021, 1:59 PM  [place], [place], [address] Phone: [phone]   Fax:  [phone]  Name:  AMARAH BROSSMAN MRN: [id number] Date of Birth: 30-Sep-1971   Manus Gunning, PT 05/02/21 1:59 PM

## 2021-05-04 ENCOUNTER — Encounter: Payer: Self-pay | Admitting: Physical Therapy

## 2021-05-04 ENCOUNTER — Ambulatory Visit: Payer: No Typology Code available for payment source | Admitting: Physical Therapy

## 2021-05-04 ENCOUNTER — Telehealth: Payer: Self-pay | Admitting: Plastic Surgery

## 2021-05-04 ENCOUNTER — Inpatient Hospital Stay: Payer: No Typology Code available for payment source | Attending: Oncology

## 2021-05-04 ENCOUNTER — Other Ambulatory Visit: Payer: Self-pay

## 2021-05-04 DIAGNOSIS — Z79899 Other long term (current) drug therapy: Secondary | ICD-10-CM | POA: Insufficient documentation

## 2021-05-04 DIAGNOSIS — C50412 Malignant neoplasm of upper-outer quadrant of left female breast: Secondary | ICD-10-CM | POA: Diagnosis present

## 2021-05-04 DIAGNOSIS — Z5111 Encounter for antineoplastic chemotherapy: Secondary | ICD-10-CM | POA: Diagnosis not present

## 2021-05-04 DIAGNOSIS — R293 Abnormal posture: Secondary | ICD-10-CM

## 2021-05-04 DIAGNOSIS — M25612 Stiffness of left shoulder, not elsewhere classified: Secondary | ICD-10-CM

## 2021-05-04 DIAGNOSIS — C50912 Malignant neoplasm of unspecified site of left female breast: Secondary | ICD-10-CM

## 2021-05-04 DIAGNOSIS — Z483 Aftercare following surgery for neoplasm: Secondary | ICD-10-CM

## 2021-05-04 DIAGNOSIS — M25611 Stiffness of right shoulder, not elsewhere classified: Secondary | ICD-10-CM

## 2021-05-04 MED ORDER — NICOTINE 21 MG/24HR TD PT24
MEDICATED_PATCH | Freq: Every day | TRANSDERMAL | 0 refills | Status: DC
  Filled 2021-05-04: qty 28, 28d supply, fill #0

## 2021-05-04 MED ORDER — GOSERELIN ACETATE 3.6 MG ~~LOC~~ IMPL
3.6000 mg | DRUG_IMPLANT | SUBCUTANEOUS | Status: AC
  Administered 2021-05-04: 3.6 mg via SUBCUTANEOUS
  Filled 2021-05-04: qty 3.6

## 2021-05-04 MED ORDER — NICOTINE POLACRILEX 2 MG MT LOZG
LOZENGE | OROMUCOSAL | 0 refills | Status: DC
  Filled 2021-05-04: qty 72, 20d supply, fill #0

## 2021-05-04 NOTE — Telephone Encounter (Signed)
Patient called to inquire about a cancelled prescription and is requesting that it be sent back to her pharmacy. Please call to advise [phone]. Thanks.

## 2021-05-04 NOTE — Therapy (Signed)
[place], [place], [address] Phone: [phone]   Fax:  [phone]  Physical Therapy Treatment  Patient Details  Name: Tricia Berry MRN: [id number] Date of Birth: Apr 03, 1971 Referring Provider (PT): Cornett   Encounter Date: 05/04/2021   PT End of Session - 05/04/21 1056     Visit Number 6    Number of Visits 10    Date for PT Re-Evaluation 05/05/21    PT Start Time 1003    PT Stop Time 1055    PT Time Calculation (min) 52 min    Activity Tolerance Patient tolerated treatment well    Behavior During Therapy [facility] for tasks assessed/performed             Past Medical History:  Diagnosis Date   Anxiety    Breast cancer ([place])    Diverticulitis    Family history of ovarian cancer    GERD (gastroesophageal reflux disease)    IBS (irritable bowel syndrome)    PONV (postoperative nausea and vomiting)    severe migraine and vomiting post anesthesia   Scoliosis     Past Surgical History:  Procedure Laterality Date   ABDOMINAL HYSTERECTOMY     still has ovaries, no gyn cancer, hysterectomy due to endometriosis. NO cervix on exam 01/10/21   BREAST BIOPSY Left 09/15/2020   [place] bx of mass, path pending, Q marker   BREAST BIOPSY Left 09/15/2020   [place] bx of LN, hydromarker, path pending   BREAST RECONSTRUCTION WITH PLACEMENT OF TISSUE EXPANDER AND FLEX HD (ACELLULAR HYDRATED DERMIS) Bilateral 03/17/2021   Procedure: IMMEDIATE BILATERAL BREAST RECONSTRUCTION WITH PLACEMENT OF TISSUE EXPANDER AND FLEX HD (ACELLULAR HYDRATED DERMIS);  Surgeon: Wallace Going, DO;  Location: [place];  Service: Plastics;  Laterality: Bilateral;   IR IMAGING GUIDED PORT INSERTION  10/01/2020   MODIFIED MASTECTOMY Left 03/17/2021   Procedure: LEFT MODIFIED RADICAL MASTECTOMY;  Surgeon: Erroll Luna, MD;  Location: [place];  Service: General;  Laterality: Left;   PORTA CATH  REMOVAL Right 03/17/2021   Procedure: PORTA CATH REMOVAL;  Surgeon: Erroll Luna, MD;  Location: [facility];  Service: General;  Laterality: Right;   TOTAL MASTECTOMY Right 03/17/2021   Procedure: TOTAL MASTECTOMY;  Surgeon: Erroll Luna, MD;  Location: [place];  Service: General;  Laterality: Right;    There were no vitals filed for this visit.   Subjective Assessment - 05/04/21 1004     Subjective I was sore the other day when I came home from physical therapy. It feels more swollen.    Pertinent History L breast cancer ER+/PR+ HER2-, stage III, will undergo a bilateral mastectomy and ALND on L (7/16) on 03/17/21, neuropathy of hands, lower legs and feet    Patient Stated Goals to gain info from providers    Currently in Pain? No/denies    Pain Score 0-No pain                               [facility] Adult PT Treatment/Exercise - 05/04/21 0001       Manual Therapy   Manual Lymphatic Drainage (MLD) in supine: 5 diaphragmatic breaths, left inguinal nodes then to R S/L for L lateral trunk/posterior axilla then retracing all steps  PT Long Term Goals - 05/02/21 1305       PT LONG TERM GOAL #1   Title Pt will return to baseline shoulder ROM measurements and not demonstrate any signs or symptoms of lymphedema.    Time 4    Period Weeks    Status Achieved      PT LONG TERM GOAL #2   Title Pt will demonstrate 165 degrees of bilateral flexion to allow pt to reach overhead.    Baseline R 143 L 101; 05/02/21- 172 on R, 175 on L    Time 4    Period Weeks    Status Achieved      PT LONG TERM GOAL #3   Title Pt will demonstrate 165 degrees of bilateral shoulder abduction to allow her to reach out the side    Baseline R 152 L 78; 05/02/21- R 180 L 179    Time 4    Period Weeks    Status Achieved      PT LONG TERM GOAL #4   Baseline 05/02/21- pt reports she is no longer having muscle spasms     Time 4    Period Weeks    Status Achieved      PT LONG TERM GOAL #5   Title Pt will be independent in a home exercise program for long term strengthening and stretching.    Time 4    Period Weeks    Status On-going      PT LONG TERM GOAL #6   Title Pt will report a 50% improvement in edema in L trunk and chest to allow improved comfort.    Baseline 05/02/21- 20% improved    Time 4    Period Weeks    Status On-going                   Plan - 05/04/21 1056     Clinical Impression Statement Pt reports she has been doing the band exercises and was sore after last session. She feels she has been having more swelling in her left lateral trunk. Educated pt to begin wearing her compression bra 24/7 and to add the chip pack when she is at home to help decrease swelling. Spent entire session focused on MLD to L lateral trunk to decrease edema. Pt will have her implants placed next week so she will return to PT in a couple of weeks.    PT Frequency 2x / week    PT Duration 4 weeks    PT Treatment/Interventions ADLs/Self Care Home Management;Therapeutic exercise;Patient/family education;Scar mobilization;Passive range of motion;Manual techniques;Manual lymph drainage;Compression bandaging;Taping;Vasopneumatic Device    PT Next Visit Plan reassess after implant placement - how is L lateral trunk swelling?    PT Home Exercise Plan post op breast exercises, instructed pt in supine dowel, supine scap series    Consulted and Agree with Plan of Care Patient             Patient will benefit from skilled therapeutic intervention in order to improve the following deficits and impairments:  Pain, Postural dysfunction, Decreased knowledge of precautions, Impaired UE functional use, Increased fascial restricitons, Decreased strength, Decreased range of motion, Decreased scar mobility, Increased edema, Increased muscle spasms  Visit Diagnosis: Stiffness of left shoulder, not elsewhere  classified  Aftercare following surgery for neoplasm  Abnormal posture  Stiffness of right shoulder, not elsewhere classified  Malignant 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, estrogen receptor positive, unspecified site of breast ([place])  Problem List Patient Active Problem List   Diagnosis Date Noted   Breast cancer ([place]) 03/17/2021   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   Genetic testing 11/22/2020   Family history of ovarian cancer    Candidal vulvovaginitis 11/01/2020   Low back pain 10/06/2020   Encounter for medical examination to establish care 09/26/2020   Malignant neoplasm of upper-outer quadrant of left breast in female, estrogen receptor positive ([place]) 09/26/2020   BACK STRAIN, LUMBAR 03/08/2010   INTERNAL HEMORRHOIDS 01/27/2010   ANAL FISSURE 01/27/2010   OSTEOARTHRITIS, HANDS, BILATERAL 01/04/2010   ARTHRALGIA 10/05/2009   INSOMNIA, CHRONIC 05/06/2009   ALLERGIC RHINITIS, SEASONAL 02/09/2009   TOBACCO ABUSE 12/29/2008   IBS 10/13/2008   Anxiety state 08/25/2008   DEPRESSION, RECURRENT 08/25/2008    Allyson Sabal [facility] 05/04/2021, 10:58 AM  [place], [place], [address] Phone: [phone]   Fax:  [phone]  Name: Tricia Berry MRN: [id number] Date of Birth: 1971-10-12   Manus Gunning, PT 05/04/21 10:59 AM

## 2021-05-05 ENCOUNTER — Ambulatory Visit: Payer: No Typology Code available for payment source

## 2021-05-05 NOTE — Telephone Encounter (Signed)
Called and spoke will the patient's husband regarding the message below.  Informed him that I spoke with [facility] and he stated because of the amount already filled from the other provider he will handle the prescription from [place] the day of surgery.  The patient's husband verbalized understanding and agreed.//AB/[name]

## 2021-05-06 ENCOUNTER — Ambulatory Visit: Payer: No Typology Code available for payment source | Admitting: Oncology

## 2021-05-06 ENCOUNTER — Other Ambulatory Visit: Payer: No Typology Code available for payment source

## 2021-05-06 ENCOUNTER — Ambulatory Visit: Payer: No Typology Code available for payment source

## 2021-05-09 ENCOUNTER — Encounter (HOSPITAL_BASED_OUTPATIENT_CLINIC_OR_DEPARTMENT_OTHER)
Admission: RE | Disposition: A | Discharge status: Home / Self Care | Payer: Self-pay | Source: Home / Self Care | Attending: Plastic Surgery

## 2021-05-09 ENCOUNTER — Ambulatory Visit (HOSPITAL_BASED_OUTPATIENT_CLINIC_OR_DEPARTMENT_OTHER)
Admission: RE | Admit: 2021-05-09 | Discharge: 2021-05-09 | Disposition: A | Discharge status: Home / Self Care | Payer: No Typology Code available for payment source | Attending: Plastic Surgery | Admitting: Plastic Surgery

## 2021-05-09 ENCOUNTER — Encounter (HOSPITAL_BASED_OUTPATIENT_CLINIC_OR_DEPARTMENT_OTHER): Payer: Self-pay | Admitting: Plastic Surgery

## 2021-05-09 ENCOUNTER — Encounter: Payer: No Typology Code available for payment source | Admitting: Physical Therapy

## 2021-05-09 ENCOUNTER — Ambulatory Visit: Payer: No Typology Code available for payment source

## 2021-05-09 ENCOUNTER — Ambulatory Visit (HOSPITAL_BASED_OUTPATIENT_CLINIC_OR_DEPARTMENT_OTHER): Payer: No Typology Code available for payment source | Admitting: Anesthesiology

## 2021-05-09 ENCOUNTER — Other Ambulatory Visit: Payer: Self-pay

## 2021-05-09 DIAGNOSIS — Z79899 Other long term (current) drug therapy: Secondary | ICD-10-CM | POA: Diagnosis not present

## 2021-05-09 DIAGNOSIS — Z888 Allergy status to other drugs, medicaments and biological substances status: Secondary | ICD-10-CM | POA: Insufficient documentation

## 2021-05-09 DIAGNOSIS — Z885 Allergy status to narcotic agent status: Secondary | ICD-10-CM | POA: Insufficient documentation

## 2021-05-09 DIAGNOSIS — Z882 Allergy status to sulfonamides status: Secondary | ICD-10-CM | POA: Insufficient documentation

## 2021-05-09 DIAGNOSIS — Z170 Estrogen receptor positive status [ER+]: Secondary | ICD-10-CM

## 2021-05-09 DIAGNOSIS — C50412 Malignant neoplasm of upper-outer quadrant of left female breast: Secondary | ICD-10-CM

## 2021-05-09 DIAGNOSIS — Z45812 Encounter for adjustment or removal of left breast implant: Secondary | ICD-10-CM | POA: Diagnosis present

## 2021-05-09 DIAGNOSIS — Z45811 Encounter for adjustment or removal of right breast implant: Secondary | ICD-10-CM | POA: Insufficient documentation

## 2021-05-09 DIAGNOSIS — F17210 Nicotine dependence, cigarettes, uncomplicated: Secondary | ICD-10-CM | POA: Diagnosis not present

## 2021-05-09 DIAGNOSIS — Z853 Personal history of malignant neoplasm of breast: Secondary | ICD-10-CM | POA: Diagnosis not present

## 2021-05-09 DIAGNOSIS — Z9013 Acquired absence of bilateral breasts and nipples: Secondary | ICD-10-CM | POA: Insufficient documentation

## 2021-05-09 HISTORY — PX: REMOVAL OF BILATERAL TISSUE EXPANDERS WITH PLACEMENT OF BILATERAL BREAST IMPLANTS: SHX6431

## 2021-05-09 SURGERY — REMOVAL, TISSUE EXPANDER, BREAST, BILATERAL, WITH BILATERAL IMPLANT IMPLANT INSERTION
Anesthesia: General | Site: Breast | Laterality: Bilateral

## 2021-05-09 MED ORDER — SODIUM CHLORIDE 0.9 % IV SOLN
INTRAVENOUS | Status: AC
  Filled 2021-05-09: qty 10

## 2021-05-09 MED ORDER — MIDAZOLAM HCL 5 MG/5ML IJ SOLN
INTRAMUSCULAR | Status: DC | PRN
  Administered 2021-05-09: 2 mg via INTRAVENOUS

## 2021-05-09 MED ORDER — MIDAZOLAM HCL 2 MG/2ML IJ SOLN
INTRAMUSCULAR | Status: AC
  Filled 2021-05-09: qty 2

## 2021-05-09 MED ORDER — LIDOCAINE 2% (20 MG/ML) 5 ML SYRINGE
INTRAMUSCULAR | Status: DC | PRN
  Administered 2021-05-09: 40 mg via INTRAVENOUS

## 2021-05-09 MED ORDER — ACETAMINOPHEN 160 MG/5ML PO SOLN: 325.0000 mg | ORAL | Status: DC | PRN

## 2021-05-09 MED ORDER — PROPOFOL 500 MG/50ML IV EMUL
INTRAVENOUS | Status: DC | PRN
  Administered 2021-05-09: 25 ug/kg/min via INTRAVENOUS

## 2021-05-09 MED ORDER — SODIUM CHLORIDE 0.9% FLUSH: 3.0000 mL | INTRAVENOUS | Status: DC | PRN

## 2021-05-09 MED ORDER — KETOROLAC TROMETHAMINE 30 MG/ML IJ SOLN
30.0000 mg | Freq: Four times a day (QID) | INTRAMUSCULAR | Status: DC | PRN
Start: 2021-05-09 — End: 2021-05-09
  Administered 2021-05-09: 30 mg via INTRAVENOUS

## 2021-05-09 MED ORDER — PROPOFOL 10 MG/ML IV BOLUS
INTRAVENOUS | Status: DC | PRN
  Administered 2021-05-09: 20 mg via INTRAVENOUS
  Administered 2021-05-09: 150 mg via INTRAVENOUS

## 2021-05-09 MED ORDER — ACETAMINOPHEN 325 MG RE SUPP: 650.0000 mg | RECTAL | Status: DC | PRN

## 2021-05-09 MED ORDER — KETOROLAC TROMETHAMINE 30 MG/ML IJ SOLN
INTRAMUSCULAR | Status: AC
  Filled 2021-05-09: qty 1

## 2021-05-09 MED ORDER — SCOPOLAMINE 1 MG/3DAYS TD PT72
MEDICATED_PATCH | TRANSDERMAL | Status: AC
  Filled 2021-05-09: qty 1

## 2021-05-09 MED ORDER — 0.9 % SODIUM CHLORIDE (POUR BTL) OPTIME: TOPICAL | Status: DC | PRN

## 2021-05-09 MED ORDER — LIDOCAINE-EPINEPHRINE 1 %-1:100000 IJ SOLN
INTRAMUSCULAR | Status: DC | PRN
  Administered 2021-05-09: 8 mL

## 2021-05-09 MED ORDER — OXYCODONE HCL 5 MG PO TABS
ORAL_TABLET | ORAL | Status: AC
  Filled 2021-05-09: qty 1

## 2021-05-09 MED ORDER — EPHEDRINE SULFATE 50 MG/ML IJ SOLN
INTRAMUSCULAR | Status: DC | PRN
  Administered 2021-05-09 (×3): 10 mg via INTRAVENOUS

## 2021-05-09 MED ORDER — SCOPOLAMINE 1 MG/3DAYS TD PT72
1.0000 | MEDICATED_PATCH | TRANSDERMAL | Status: DC
  Administered 2021-05-09: 1.5 mg via TRANSDERMAL

## 2021-05-09 MED ORDER — PHENYLEPHRINE HCL (PRESSORS) 10 MG/ML IV SOLN
INTRAVENOUS | Status: DC | PRN
Start: 2021-05-09 — End: 2021-05-09
  Administered 2021-05-09: 80 ug via INTRAVENOUS

## 2021-05-09 MED ORDER — DEXAMETHASONE SODIUM PHOSPHATE 4 MG/ML IJ SOLN
INTRAMUSCULAR | Status: DC | PRN
  Administered 2021-05-09: 10 mg via INTRAVENOUS

## 2021-05-09 MED ORDER — SODIUM CHLORIDE 0.9 % IV SOLN: 250.0000 mL | INTRAVENOUS | Status: DC | PRN

## 2021-05-09 MED ORDER — OXYCODONE HCL 5 MG PO TABS
5.0000 mg | ORAL_TABLET | Freq: Once | ORAL | Status: AC | PRN
  Administered 2021-05-09: 5 mg via ORAL

## 2021-05-09 MED ORDER — CHLORHEXIDINE GLUCONATE CLOTH 2 % EX PADS: 6.0000 | MEDICATED_PAD | Freq: Once | CUTANEOUS | Status: DC

## 2021-05-09 MED ORDER — SODIUM CHLORIDE 0.9 % IV SOLN
INTRAVENOUS | Status: DC | PRN
  Administered 2021-05-09: 400 mL

## 2021-05-09 MED ORDER — FENTANYL CITRATE (PF) 100 MCG/2ML IJ SOLN
INTRAMUSCULAR | Status: DC | PRN
  Administered 2021-05-09: 100 ug via INTRAVENOUS

## 2021-05-09 MED ORDER — FENTANYL CITRATE (PF) 100 MCG/2ML IJ SOLN: 25.0000 ug | INTRAMUSCULAR | Status: DC | PRN

## 2021-05-09 MED ORDER — ACETAMINOPHEN 325 MG PO TABS
325.0000 mg | ORAL_TABLET | ORAL | Status: DC | PRN
  Administered 2021-05-09: 650 mg via ORAL

## 2021-05-09 MED ORDER — ACETAMINOPHEN 325 MG PO TABS: 650.0000 mg | ORAL_TABLET | ORAL | Status: DC | PRN

## 2021-05-09 MED ORDER — OXYCODONE HCL 5 MG/5ML PO SOLN: 5.0000 mg | Freq: Once | ORAL | Status: AC | PRN

## 2021-05-09 MED ORDER — CEFAZOLIN SODIUM-DEXTROSE 2-4 GM/100ML-% IV SOLN
INTRAVENOUS | Status: AC
  Filled 2021-05-09: qty 100

## 2021-05-09 MED ORDER — ONDANSETRON HCL 4 MG/2ML IJ SOLN: 4.0000 mg | Freq: Once | INTRAMUSCULAR | Status: DC | PRN

## 2021-05-09 MED ORDER — CEFAZOLIN SODIUM-DEXTROSE 2-4 GM/100ML-% IV SOLN
2.0000 g | INTRAVENOUS | Status: AC
  Administered 2021-05-09: 2 g via INTRAVENOUS

## 2021-05-09 MED ORDER — ACETAMINOPHEN 325 MG PO TABS
ORAL_TABLET | ORAL | Status: AC
  Filled 2021-05-09: qty 2

## 2021-05-09 MED ORDER — FENTANYL CITRATE (PF) 100 MCG/2ML IJ SOLN
INTRAMUSCULAR | Status: AC
  Filled 2021-05-09: qty 2

## 2021-05-09 MED ORDER — ONDANSETRON HCL 4 MG/2ML IJ SOLN
INTRAMUSCULAR | Status: DC | PRN
  Administered 2021-05-09: 4 mg via INTRAVENOUS

## 2021-05-09 MED ORDER — LACTATED RINGERS IV SOLN: INTRAVENOUS | Status: DC

## 2021-05-09 MED ORDER — MEPERIDINE HCL 25 MG/ML IJ SOLN: 6.2500 mg | INTRAMUSCULAR | Status: DC | PRN

## 2021-05-09 MED ORDER — SODIUM CHLORIDE 0.9% FLUSH: 3.0000 mL | Freq: Two times a day (BID) | INTRAVENOUS | Status: DC

## 2021-05-09 SURGICAL SUPPLY — 66 items
ADH SKN CLS APL DERMABOND .7 (GAUZE/BANDAGES/DRESSINGS) ×2
BAG DECANTER FOR FLEXI CONT (MISCELLANEOUS) ×2 IMPLANT
BINDER BREAST LRG (GAUZE/BANDAGES/DRESSINGS) IMPLANT
BINDER BREAST MEDIUM (GAUZE/BANDAGES/DRESSINGS) ×2 IMPLANT
BINDER BREAST XLRG (GAUZE/BANDAGES/DRESSINGS) IMPLANT
BINDER BREAST XXLRG (GAUZE/BANDAGES/DRESSINGS) IMPLANT
BIOPATCH RED 1 DISK 7.0 (GAUZE/BANDAGES/DRESSINGS) IMPLANT
BLADE HEX COATED 2.75 (ELECTRODE) ×2 IMPLANT
BLADE SURG 15 STRL LF DISP TIS (BLADE) ×2 IMPLANT
BLADE SURG 15 STRL SS (BLADE) ×4
CANISTER SUCT 1200ML W/VALVE (MISCELLANEOUS) ×2 IMPLANT
COVER BACK TABLE 60X90IN (DRAPES) ×2 IMPLANT
COVER MAYO STAND STRL (DRAPES) ×2 IMPLANT
DECANTER SPIKE VIAL GLASS SM (MISCELLANEOUS) IMPLANT
DERMABOND ADVANCED (GAUZE/BANDAGES/DRESSINGS) ×2
DERMABOND ADVANCED .7 DNX12 (GAUZE/BANDAGES/DRESSINGS) ×2 IMPLANT
DRAIN CHANNEL 19F RND (DRAIN) IMPLANT
DRAPE LAPAROSCOPIC ABDOMINAL (DRAPES) ×2 IMPLANT
DRSG OPSITE POSTOP 4X6 (GAUZE/BANDAGES/DRESSINGS) ×4 IMPLANT
DRSG PAD ABDOMINAL 8X10 ST (GAUZE/BANDAGES/DRESSINGS) ×4 IMPLANT
ELECT BLADE 4.0 EZ CLEAN MEGAD (MISCELLANEOUS) ×2
ELECT REM PT RETURN 9FT ADLT (ELECTROSURGICAL) ×2
ELECTRODE BLDE 4.0 EZ CLN MEGD (MISCELLANEOUS) ×1 IMPLANT
ELECTRODE REM PT RTRN 9FT ADLT (ELECTROSURGICAL) ×1 IMPLANT
EVACUATOR SILICONE 100CC (DRAIN) IMPLANT
FUNNEL KELLER 2 DISP (MISCELLANEOUS) IMPLANT
GAUZE SPONGE 4X4 12PLY STRL LF (GAUZE/BANDAGES/DRESSINGS) ×2 IMPLANT
GLOVE SURG ENC MOIS LTX SZ6 (GLOVE) ×6 IMPLANT
GLOVE SURG ENC MOIS LTX SZ7 (GLOVE) ×2 IMPLANT
GOWN STRL REUS W/ TWL LRG LVL3 (GOWN DISPOSABLE) ×3 IMPLANT
GOWN STRL REUS W/TWL LRG LVL3 (GOWN DISPOSABLE) ×6
IMPL BREAST SALINE HP 270CC (Breast) ×2 IMPLANT
IMPLANT BREAST SALINE HP 270CC (Breast) ×4 IMPLANT
IV NS 1000ML (IV SOLUTION) ×2
IV NS 1000ML BAXH (IV SOLUTION) ×1 IMPLANT
IV NS 500ML (IV SOLUTION)
IV NS 500ML BAXH (IV SOLUTION) IMPLANT
KIT FILL SYSTEM UNIVERSAL (SET/KITS/TRAYS/PACK) ×2 IMPLANT
NDL SAFETY ECLIPSE 18X1.5 (NEEDLE) ×1 IMPLANT
NEEDLE HYPO 18GX1.5 SHARP (NEEDLE) ×2
NEEDLE HYPO 25X1 1.5 SAFETY (NEEDLE) ×2 IMPLANT
PACK BASIN DAY SURGERY FS (CUSTOM PROCEDURE TRAY) ×2 IMPLANT
PENCIL SMOKE EVACUATOR (MISCELLANEOUS) ×2 IMPLANT
PIN SAFETY STERILE (MISCELLANEOUS) IMPLANT
SIZER BREAST SGL USE 310CC (SIZER) ×2
SIZER BRST SGL USE 310CC (SIZER) ×1 IMPLANT
SLEEVE SCD COMPRESS KNEE MED (STOCKING) ×2 IMPLANT
SPONGE T-LAP 18X18 ~~LOC~~+RFID (SPONGE) ×4 IMPLANT
STRIP SUTURE WOUND CLOSURE 1/2 (MISCELLANEOUS) ×4 IMPLANT
SUT MNCRL AB 4-0 PS2 18 (SUTURE) ×8 IMPLANT
SUT MON AB 3-0 SH 27 (SUTURE) ×8
SUT MON AB 3-0 SH27 (SUTURE) ×4 IMPLANT
SUT MON AB 5-0 PS2 18 (SUTURE) ×2 IMPLANT
SUT PDS 3-0 CT2 (SUTURE) ×12
SUT PDS AB 2-0 CT2 27 (SUTURE) IMPLANT
SUT PDS II 3-0 CT2 27 ABS (SUTURE) ×6 IMPLANT
SUT VIC AB 3-0 SH 27 (SUTURE)
SUT VIC AB 3-0 SH 27X BRD (SUTURE) IMPLANT
SUT VICRYL 4-0 PS2 18IN ABS (SUTURE) IMPLANT
SYR BULB IRRIG 60ML STRL (SYRINGE) ×2 IMPLANT
SYR CONTROL 10ML LL (SYRINGE) ×2 IMPLANT
TOWEL GREEN STERILE FF (TOWEL DISPOSABLE) ×4 IMPLANT
TRAY DSU PREP LF (CUSTOM PROCEDURE TRAY) ×2 IMPLANT
TUBE CONNECTING 20X1/4 (TUBING) ×2 IMPLANT
UNDERPAD 30X36 HEAVY ABSORB (UNDERPADS AND DIAPERS) ×4 IMPLANT
YANKAUER SUCT BULB TIP NO VENT (SUCTIONS) ×2 IMPLANT

## 2021-05-09 NOTE — Transfer of Care (Signed)
Immediate Anesthesia Transfer of Care Note  Patient: Tricia Berry  Procedure(s) Performed: REMOVAL OF BILATERAL TISSUE EXPANDERS WITH PLACEMENT OF BILATERAL BREAST IMPLANTS (Bilateral: Breast)  Patient Location: PACU  Anesthesia Type:General  Level of Consciousness: awake, alert  and oriented  Airway & Oxygen Therapy: Patient Spontanous Breathing and Patient connected to face mask oxygen  Post-op Assessment: Report given to RN and Post -op Vital signs reviewed and stable  Post vital signs: Reviewed and stable  Last Vitals:  Vitals Value Taken Time  BP 119/85 05/09/21 1342  Temp    Pulse 91 05/09/21 1343  Resp    SpO2 99 % 05/09/21 1343  Vitals shown include unvalidated device data.  Last Pain:  Vitals:   05/09/21 1137  TempSrc: Oral  PainSc: 2       Patients Stated Pain Goal: 5 (40/37/54 3606)  Complications: No notable events documented.

## 2021-05-09 NOTE — Anesthesia Postprocedure Evaluation (Signed)
Anesthesia Post Note  Patient: Tricia Berry  Procedure(s) Performed: REMOVAL OF BILATERAL TISSUE EXPANDERS WITH PLACEMENT OF BILATERAL BREAST IMPLANTS (Bilateral: Breast)     Patient location during evaluation: PACU Anesthesia Type: General Level of consciousness: awake and alert Pain management: pain level controlled Vital Signs Assessment: post-procedure vital signs reviewed and stable Respiratory status: spontaneous breathing, nonlabored ventilation, respiratory function stable and patient connected to nasal cannula oxygen Cardiovascular status: blood pressure returned to baseline and stable Postop Assessment: no apparent nausea or vomiting Anesthetic complications: no   No notable events documented.  Last Vitals:  Vitals:   05/09/21 1345 05/09/21 1400  BP: 138/82 140/80  Pulse: 92 84  Resp: 14 18  Temp:    SpO2: 98% 97%    Last Pain:  Vitals:   05/09/21 1400  TempSrc:   PainSc: 0-No pain                 Lisbet Busker

## 2021-05-09 NOTE — Discharge Instructions (Addendum)
INSTRUCTIONS FOR AFTER SURGERY   You will likely have some questions about what to expect following your operation.  The following information will help you and your family understand what to expect when you are discharged from the hospital.  Following these guidelines will help ensure a smooth recovery and reduce risks of complications.  Postoperative instructions include information on: diet, wound care, medications and physical activity.  AFTER SURGERY Expect to go home after the procedure.  In some cases, you may need to spend one night in the hospital for observation.  DIET This surgery does not require a specific diet.  However, I have to mention that the healthier you eat the better your body can start healing. It is important to increasing your protein intake.  This means limiting the foods with added sugar.  Focus on fruits and vegetables and some meat. It is very important to drink water after your surgery.  If your urine is bright yellow, then it is concentrated, and you need to drink more water.  As a general rule after surgery, you should have 8 ounces of water every hour while awake.  If you find you are persistently nauseated or unable to take in liquids let us know.  NO TOBACCO USE or EXPOSURE.  This will slow your healing process and increase the risk of a wound.  WOUND CARE If you don't have a drain: You can shower the day after surgery.  Use fragrance free soap.  Dial, [place], [place] and Cetaphil are usually mild on the skin.  If you have steri-strips / tape directly attached to your skin leave them in place. It is OK to get these wet.  No baths, pools or hot tubs for two weeks. We close your incision to leave the smallest and best-looking scar. No ointment or creams on your incisions until given the go ahead.  Especially not Neosporin (Too many skin reactions with this one).  A few weeks after surgery you can use Mederma and start massaging the scar. We ask you to wear your binder or  sports bra for the first 6 weeks around the clock, including while sleeping. This provides added comfort and helps reduce the fluid accumulation at the surgery site.  ACTIVITY No heavy lifting until cleared by the doctor.  It is OK to walk and climb stairs. In fact, moving your legs is very important to decrease your risk of a blood clot.  It will also help keep you from getting deconditioned.  Every 1 to 2 hours get up and walk for 5 minutes. This will help with a quicker recovery back to normal.  Let pain be your guide so you don't do too much.  NO, you cannot do the spring cleaning and don't plan on taking care of anyone else.  This is your time for TLC.   WORK Everyone returns to work at different times. As a rough guide, most people take at least 1 - 2 weeks off prior to returning to work. If you need documentation for your job, bring the forms to your postoperative follow up visit.  DRIVING Arrange for someone to bring you home from the hospital.  You may be able to drive a few days after surgery but not while taking any narcotics or valium.  BOWEL MOVEMENTS Constipation can occur after anesthesia and while taking pain medication.  It is important to stay ahead for your comfort.  We recommend taking Milk of Magnesia (2 tablespoons; twice a day) while taking  the pain pills.  SEROMA This is fluid your body tried to put in the surgical site.  This is normal but if it creates excessive pain and swelling let us know.  It usually decreases in a few weeks.  MEDICATIONS and PAIN CONTROL At your preoperative visit for you history and physical you were given the following medications: An antibiotic: Start this medication when you get home and take according to the instructions on the bottle. Zofran 4 mg:  This is to treat nausea and vomiting.  You can take this every 6 hours as needed and only if needed. Norco (hydrocodone/acetaminophen) 5/325 mg:  This is only to be used after you have taken the  motrin or the tylenol. Every 8 hours as needed. Over the counter Medication to take: Ibuprofen (Motrin) 600 mg:  Take this every 6 hours.  If you have additional pain then take 500 mg of the tylenol.  Only take the Norco after you have tried these two. Miralax or stool softener of choice: Take this according to the bottle if you take the [place] Call your surgeon's office if any of the following occur:  Fever 101 degrees F or greater  Excessive bleeding or fluid from the incision site.  Pain that increases over time without aid from the medications  Redness, warmth, or pus draining from incision sites  Persistent nausea or inability to take in liquids  Severe misshapen area that underwent the operation.  NO TYLENOL until 6:30 p.m. OXYcodone 5mg  given at 2:30 p.m. NO ibuprofen until 8:30 p.m.      Post Anesthesia Home Care Instructions  Activity: Get plenty of rest for the remainder of the day. A responsible individual must stay with you for 24 hours following the procedure.  For the next 24 hours, DO NOT: -Drive a car -Paediatric nurse -Drink alcoholic beverages -Take any medication unless instructed by your physician -Make any legal decisions or sign important papers.  Meals: Start with liquid foods such as gelatin or soup. Progress to regular foods as tolerated. Avoid greasy, spicy, heavy foods. If nausea and/or vomiting occur, drink only clear liquids until the nausea and/or vomiting subsides. Call your physician if vomiting continues.  Special Instructions/Symptoms: Your throat may feel dry or sore from the anesthesia or the breathing tube placed in your throat during surgery. If this causes discomfort, gargle with warm salt water. The discomfort should disappear within 24 hours.  If you had a scopolamine patch placed behind your ear for the management of post- operative nausea and/or vomiting:  1. The medication in the patch is effective for 72 hours, after  which it should be removed.  Wrap patch in a tissue and discard in the trash. Wash hands thoroughly with soap and water. 2. You may remove the patch earlier than 72 hours if you experience unpleasant side effects which may include dry mouth, dizziness or visual disturbances. 3. Avoid touching the patch. Wash your hands with soap and water after contact with the patch.

## 2021-05-09 NOTE — Anesthesia Procedure Notes (Signed)
Procedure Name: LMA Insertion Date/Time: 05/09/2021 12:11 PM Performed by: Maryella Shivers, CRNA Pre-anesthesia Checklist: Patient identified, Emergency Drugs available, Suction available and Patient being monitored Patient Re-evaluated:Patient Re-evaluated prior to induction Oxygen Delivery Method: Circle system utilized Preoxygenation: Pre-oxygenation with 100% oxygen Induction Type: IV induction Ventilation: Mask ventilation without difficulty LMA: LMA inserted LMA Size: 4.0 Number of attempts: 1 Airway Equipment and Method: Bite block Placement Confirmation: positive ETCO2 Tube secured with: Tape Dental Injury: Teeth and Oropharynx as per pre-operative assessment

## 2021-05-09 NOTE — Op Note (Addendum)
Op report Bilateral Exchange   DATE OF OPERATION: 05/09/2021  LOCATION: [place]  SURGICAL DIVISION: Plastic Surgery  PREOPERATIVE DIAGNOSIS:  History of breast cancer.  2.   Acquired absence of bilateral breast.   POSTOPERATIVE DIAGNOSIS:  1. History of breast cancer.  2. Acquired absence of bilateral breast.   PROCEDURE:  1. Bilateral exchange of tissue expanders for implants.  2. Bilateral minor capsulotomies for implant respositioning.  SURGEON: Cayenne Breault Sanger Eulene Pekar, DO  ASSISTANT: Roetta Sessions, PA  ANESTHESIA:  General.   COMPLICATIONS: None.   IMPLANTS: Left - Mentor Smooth Round High Profile Saline 270 - 325 cc. Ref #[id number].  Serial Number [id number], 320 cc placed Right - Mentor Smooth Round High Profile Saline 270 -325 cc. Ref #[id number].  Serial Number [id number], 320 cc placed  INDICATIONS FOR PROCEDURE:  The patient, Tricia Berry, is a 50 y.o. female born on [birth year], is here for treatment after bilateral mastectomies.  She had tissue expanders placed at the time of mastectomies. She now presents for exchange of her expanders for implants.  She requires capsulotomies to better position the implants. MRN: [id number]  CONSENT:  Informed consent was obtained directly from the patient. Risks, benefits and alternatives were fully discussed. Specific risks including but not limited to bleeding, infection, hematoma, seroma, scarring, pain, implant infection, implant extrusion, capsular contracture, asymmetry, wound healing problems, and need for further surgery were all discussed. The patient did have an ample opportunity to have her questions answered to her satisfaction.   DESCRIPTION OF PROCEDURE:  The patient was taken to the operating room. SCDs were placed and IV antibiotics were given. The patient's chest was prepped and draped in a sterile fashion. A time out was performed and the implants to be used were identified.     On the right breast: One percent Lidocaine with epinephrine was used to infiltrate at the incision site. The old mastectomy scar was incised.  The mastectomy flaps from the superior and inferior flaps were raised over the pectoralis major muscle for several centimeters to minimize tension for the closure. The pectoralis was split inferior to the skin incision to expose and remove the tissue expander.  Inspection of the pocket showed a normal healthy capsule and good integration of the biologic matrix.  The pocket was irrigated with antibiotic solution.  Medial and inferior capsulotomies were performed to allow for breast pocket expansion.  Measurements were made and a sizer used to confirm adequate pocket size for the implant dimensions.  The sizer was filled with air.  Hemostasis was ensured with electrocautery. New gloves were placed. The implant was soaked in antibiotic solution and then placed in the pocket and oriented appropriately. The pectoralis major muscle and capsule on the anterior surface were re-closed with a 3-0 PDS and Monocryl suture. The remaining skin was closed with 4-0 Monocryl deep dermal and 5-0 Monocryl subcuticular stitches.   On the left breast: The old mastectomy scar was incised.  The mastectomy flaps from the superior and inferior flaps were raised over the pectoralis major muscle for several centimeters to minimize tension for the closure. The pectoralis was split inferior to the skin incision to expose and remove the tissue expander.  Inspection of the pocket showed a normal healthy capsule and good integration of the biologic matrix.   Medial and inferior capsulotomies were performed to allow for breast pocket expansion.  Measurements were made and a sizer utilized to confirm adequate pocket size for the implant dimensions.  Hemostasis  was ensured with the electrocautery.  New gloves were applied. The implant was soaked in antibiotic solution and placed in the pocket and  oriented appropriately. The pectoralis major muscle and capsule on the anterior surface were re-closed with a 3-0 PDS and Monocryl suture. The remaining skin was closed with 4-0 Monocryl deep dermal and 5-0 Monocryl subcuticular stitches.  Dermabond was applied to the incision site. A breast binder and ABDs were placed.  The patient was allowed to wake from anesthesia and taken to the recovery room in satisfactory condition.   The advanced practice practitioner (APP) assisted throughout the case.  The APP was essential in retraction and counter traction when needed to make the case progress smoothly.  This retraction and assistance made it possible to see the tissue plans for the procedure.  The assistance was needed for blood control, tissue re-approximation and assisted with closure of the incision site.

## 2021-05-09 NOTE — Anesthesia Preprocedure Evaluation (Addendum)
Anesthesia Evaluation  Patient identified by MRN, date of birth, ID band Patient awake    Reviewed: Allergy & Precautions, H&P , NPO status , Patient's Chart, lab work & pertinent test results  History of Anesthesia Complications (+) PONV and history of anesthetic complications  Airway Mallampati: II  TM Distance: >3 FB Neck ROM: Full    Dental no notable dental hx. (+) Edentulous Upper, Edentulous Lower, Upper Dentures, Lower Dentures   Pulmonary neg pulmonary ROS, Current Smoker,    Pulmonary exam normal breath sounds clear to auscultation       Cardiovascular Exercise Tolerance: Good negative cardio ROS Normal cardiovascular exam Rhythm:Regular Rate:Normal     Neuro/Psych PSYCHIATRIC DISORDERS Anxiety Depression negative neurological ROS  negative psych ROS   GI/Hepatic negative GI ROS, Neg liver ROS, GERD  Medicated,  Endo/Other  negative endocrine ROS  Renal/GU negative Renal ROS  negative genitourinary   Musculoskeletal negative musculoskeletal ROS (+) Arthritis , Osteoarthritis,    Abdominal   Peds negative pediatric ROS (+)  Hematology negative hematology ROS (+)   Anesthesia Other Findings   Reproductive/Obstetrics negative OB ROS                             Anesthesia Physical  Anesthesia Plan  ASA: 3  Anesthesia Plan: General   Post-op Pain Management:    Induction: Intravenous  PONV Risk Score and Plan: 3 and Ondansetron, Dexamethasone, Treatment may vary due to age or medical condition and Midazolam  Airway Management Planned: Oral ETT and LMA  Additional Equipment: None  Intra-op Plan:   Post-operative Plan: Extubation in OR  Informed Consent: I have reviewed the patients History and Physical, chart, labs and discussed the procedure including the risks, benefits and alternatives for the proposed anesthesia with the patient or authorized representative who  has indicated his/her understanding and acceptance.       Plan Discussed with: Anesthesiologist and CRNA  Anesthesia Plan Comments:         Anesthesia Quick Evaluation

## 2021-05-09 NOTE — Interval H&P Note (Signed)
History and Physical Interval Note:  05/09/2021 11:44 AM  Tricia Berry  has presented today for surgery, with the diagnosis of Malignant neoplasm of upper-outer quadrant of left breast.  The various methods of treatment have been discussed with the patient and family. After consideration of risks, benefits and other options for treatment, the patient has consented to  Procedure(s) with comments: REMOVAL OF BILATERAL TISSUE EXPANDERS WITH PLACEMENT OF BILATERAL BREAST IMPLANTS (Bilateral) - 90 min as a surgical intervention.  The patient's history has been reviewed, patient examined, no change in status, stable for surgery.  I have reviewed the patient's chart and labs.  Questions were answered to the patient's satisfaction.     [name] Drucella Karbowski

## 2021-05-10 ENCOUNTER — Encounter (HOSPITAL_BASED_OUTPATIENT_CLINIC_OR_DEPARTMENT_OTHER): Payer: Self-pay | Admitting: Plastic Surgery

## 2021-05-10 ENCOUNTER — Ambulatory Visit: Payer: No Typology Code available for payment source

## 2021-05-11 ENCOUNTER — Ambulatory Visit: Payer: No Typology Code available for payment source

## 2021-05-11 ENCOUNTER — Encounter: Payer: No Typology Code available for payment source | Admitting: Physical Therapy

## 2021-05-12 ENCOUNTER — Telehealth: Payer: Self-pay

## 2021-05-12 ENCOUNTER — Ambulatory Visit: Payer: No Typology Code available for payment source

## 2021-05-12 DIAGNOSIS — C50412 Malignant neoplasm of upper-outer quadrant of left female breast: Secondary | ICD-10-CM

## 2021-05-12 NOTE — Progress Notes (Signed)
CARE Discharge Progress Report  Patient Details  Name: Tricia Berry MRN: [id number] Date of Birth: 15-Nov-1970 Referring Provider:     Number of Visits: 18  Reason for Discharge:  Early Exit:  Medical / Not ready, needs clearance to come back post-op  Smoking History:  Social History   Tobacco Use  Smoking Status Every Day   Packs/day: 0.25   Types: Cigarettes  Smokeless Tobacco Never  Tobacco Comments   Pt trying to quit now. Has reduced amount significantly    Diagnosis:  Malignant neoplasm of upper-outer quadrant of left breast in female, estrogen receptor positive ([place])  ADL UCSD:   Initial Exercise Prescription:  Initial Exercise Prescription - 11/16/20 1400       Prescription Details   Frequency (times per week) 2    Duration Progress to 30 minutes of continuous aerobic without signs/symptoms of physical distress      Intensity   THRR 40-80% of Max Heartrate 124-154    Ratings of Perceived Exertion 11-13    Perceived Dyspnea 0-4      Progression   Progression Continue to progress workloads to maintain intensity without signs/symptoms of physical distress.             Discharge Exercise Prescription (Final Exercise Prescription Changes):  Exercise Prescription Changes - 11/16/20 1400       Response to Exercise   Blood Pressure (Admit) 98/64    Blood Pressure (Exercise) 112/66    Blood Pressure (Exit) 94/64    Heart Rate (Admit) 96 bpm    Heart Rate (Exercise) 113 bpm    Heart Rate (Exit) 91 bpm    Oxygen Saturation (Admit) 97 %    Oxygen Saturation (Exercise) 93 %    Oxygen Saturation (Exit) 98 %    Rating of Perceived Exertion (Exercise) 11    Perceived Dyspnea (Exercise) 0    Symptoms none    Comments walk test results      Resistance Training   Weight 3 lb   ROM on left side   Reps 10-15      Treadmill   MPH 2.2    Grade 0.5    Minutes 15    METs 2.84      Recumbant Bike   Level 2    RPM 60    Watts 25    Minutes 15     METs 3.9      NuStep   Level 3    SPM 80    Minutes 15    METs 3.9      REL-XR   Level 2    Minutes 15    METs 3.9             Functional Capacity:  6 Minute Walk     Row Name 11/16/20 1358         6 Minute Walk   Phase Initial     Distance 1190 feet     Walk Time 6 minutes     # of Rest Breaks 0     MPH 2.25     METS 3.96     RPE 11     Perceived Dyspnea  0     VO2 Peak 13.87     Symptoms No     Resting HR 96 bpm     Resting BP 98/64     Resting Oxygen Saturation  97 %     Exercise Oxygen Saturation  during 6 min walk 93 %  Max Ex. HR 113 bpm     Max Ex. BP 112/66     2 Minute Post BP 94/64               Nutrition & Weight - Outcomes:  Pre Biometrics - 11/16/20 1358       Pre Biometrics   Height 5\' 2"  (1.575 m)    Weight 117 lb 14.4 oz (53.5 kg)    BMI (Calculated) 21.56    Single Leg Stand 30 seconds              Goals reviewed with patient; copy given to patient.

## 2021-05-12 NOTE — Telephone Encounter (Signed)
Called patient regarding CARE- she is physically restricted post-op and not able to exercise at this time. Patient still needs to complete PT- will discharge from CARE at this time. Informed patient to contact doctor to send over new referral when she felt ready and cleared to do so. Patient understood.

## 2021-05-13 ENCOUNTER — Ambulatory Visit: Payer: No Typology Code available for payment source

## 2021-05-16 ENCOUNTER — Ambulatory Visit: Payer: No Typology Code available for payment source

## 2021-05-17 ENCOUNTER — Ambulatory Visit: Payer: No Typology Code available for payment source

## 2021-05-17 ENCOUNTER — Other Ambulatory Visit: Payer: Self-pay

## 2021-05-17 ENCOUNTER — Encounter: Payer: Self-pay | Admitting: Plastic Surgery

## 2021-05-17 ENCOUNTER — Ambulatory Visit (INDEPENDENT_AMBULATORY_CARE_PROVIDER_SITE_OTHER): Payer: No Typology Code available for payment source | Admitting: Plastic Surgery

## 2021-05-17 DIAGNOSIS — C50412 Malignant neoplasm of upper-outer quadrant of left female breast: Secondary | ICD-10-CM

## 2021-05-17 DIAGNOSIS — Z170 Estrogen receptor positive status [ER+]: Secondary | ICD-10-CM

## 2021-05-17 NOTE — Progress Notes (Signed)
   Subjective:    Patient ID: Tricia Berry, female    DOB: 08/22/1971, 50 y.o.   MRN: [id number]  The patient is a 50 yrs old female here for follow up on her breast surgery.    She had bilateral mastectomies with expanders placed.  Last week we were able to exchange the expanders for implants.  She has Mentor smooth round high-profile saline implants that are 270-325.  She has a total of 320 cc in each expander.  No sign of infection or hematoma.  Incisions are intact.  She is a little bit of soreness but otherwise doing really well.     Review of Systems  Constitutional: Negative.   HENT: Negative.    Eyes: Negative.   Respiratory: Negative.    Cardiovascular: Negative.   Genitourinary: Negative.       Objective:   Physical Exam Nursing note reviewed.  Constitutional:      Appearance: Normal appearance.  Cardiovascular:     Rate and Rhythm: Normal rate.     Pulses: Normal pulses.  Skin:    General: Skin is warm.     Capillary Refill: Capillary refill takes less than 2 seconds.  Neurological:     Mental Status: She is alert.      Assessment & Plan:     ICD-10-CM   1. Malignant neoplasm of upper-outer quadrant of left breast in female, estrogen receptor positive ([place])  C50.412    Z17.0       We will plan to remove the dressing at her next visit.  She can shower and go into a sports bra.  She is going to start radiation soon and will see her after that.

## 2021-05-18 ENCOUNTER — Ambulatory Visit: Payer: No Typology Code available for payment source

## 2021-05-19 ENCOUNTER — Ambulatory Visit: Payer: No Typology Code available for payment source

## 2021-05-19 ENCOUNTER — Ambulatory Visit: Admission: RE | Admit: 2021-05-19 | Payer: No Typology Code available for payment source | Source: Ambulatory Visit

## 2021-05-20 ENCOUNTER — Encounter: Payer: Self-pay | Admitting: Oncology

## 2021-05-20 ENCOUNTER — Inpatient Hospital Stay (HOSPITAL_BASED_OUTPATIENT_CLINIC_OR_DEPARTMENT_OTHER): Payer: No Typology Code available for payment source | Admitting: Oncology

## 2021-05-20 ENCOUNTER — Ambulatory Visit: Payer: No Typology Code available for payment source

## 2021-05-20 ENCOUNTER — Inpatient Hospital Stay: Payer: No Typology Code available for payment source

## 2021-05-20 ENCOUNTER — Other Ambulatory Visit: Payer: Self-pay

## 2021-05-20 VITALS — BP 129/78 | HR 87 | Temp 97.5°F | Resp 20 | Wt 113.6 lb

## 2021-05-20 DIAGNOSIS — R350 Frequency of micturition: Secondary | ICD-10-CM

## 2021-05-20 DIAGNOSIS — Z170 Estrogen receptor positive status [ER+]: Secondary | ICD-10-CM | POA: Diagnosis not present

## 2021-05-20 DIAGNOSIS — C50412 Malignant neoplasm of upper-outer quadrant of left female breast: Secondary | ICD-10-CM | POA: Diagnosis not present

## 2021-05-20 DIAGNOSIS — Z5111 Encounter for antineoplastic chemotherapy: Secondary | ICD-10-CM | POA: Diagnosis not present

## 2021-05-20 DIAGNOSIS — Z5112 Encounter for antineoplastic immunotherapy: Secondary | ICD-10-CM

## 2021-05-20 LAB — URINALYSIS, COMPLETE (UACMP) WITH MICROSCOPIC
Bilirubin Urine: NEGATIVE
Glucose, UA: NEGATIVE mg/dL
Ketones, ur: NEGATIVE mg/dL
Leukocytes,Ua: NEGATIVE
Nitrite: NEGATIVE
Protein, ur: NEGATIVE mg/dL
Specific Gravity, Urine: 1.004 — ABNORMAL LOW (ref 1.005–1.030)
pH: 6 (ref 5.0–8.0)

## 2021-05-20 MED ORDER — HEPARIN SOD (PORK) LOCK FLUSH 100 UNIT/ML IV SOLN
INTRAVENOUS | Status: AC
  Filled 2021-05-20: qty 5

## 2021-05-20 MED ORDER — SODIUM CHLORIDE 0.9% FLUSH
10.0000 mL | INTRAVENOUS | Status: DC | PRN
  Filled 2021-05-20: qty 10

## 2021-05-20 MED ORDER — SODIUM CHLORIDE 0.9 % IV SOLN
840.0000 mg | Freq: Once | INTRAVENOUS | Status: AC
  Administered 2021-05-20: 840 mg via INTRAVENOUS
  Filled 2021-05-20: qty 28

## 2021-05-20 MED ORDER — DIPHENHYDRAMINE HCL 25 MG PO CAPS
50.0000 mg | ORAL_CAPSULE | Freq: Once | ORAL | Status: AC
  Administered 2021-05-20: 50 mg via ORAL
  Filled 2021-05-20: qty 2

## 2021-05-20 MED ORDER — ACETAMINOPHEN 325 MG PO TABS
650.0000 mg | ORAL_TABLET | Freq: Once | ORAL | Status: AC
  Administered 2021-05-20: 650 mg via ORAL
  Filled 2021-05-20: qty 2

## 2021-05-20 MED ORDER — TRASTUZUMAB-DKST CHEMO 150 MG IV SOLR
300.0000 mg | Freq: Once | INTRAVENOUS | Status: AC
  Administered 2021-05-20: 300 mg via INTRAVENOUS
  Filled 2021-05-20: qty 14.29

## 2021-05-20 MED ORDER — SODIUM CHLORIDE 0.9 % IV SOLN
Freq: Once | INTRAVENOUS | Status: AC
  Filled 2021-05-20: qty 250

## 2021-05-20 NOTE — Patient Instructions (Signed)
[facility]  Discharge Instructions: Thank you for choosing [facility] to provide your oncology and hematology care.  If you have a lab appointment with the [facility], please go directly to the [facility] and check in at the registration area.  Wear comfortable clothing and clothing appropriate for easy access to any Portacath or PICC line.   We strive to give you quality time with your provider. You may need to reschedule your appointment if you arrive late (15 or more minutes).  Arriving late affects you and other patients whose appointments are after yours.  Also, if you miss three or more appointments without notifying the office, you may be dismissed from the clinic at the provider's discretion.      For prescription refill requests, have your pharmacy contact our office and allow 72 hours for refills to be completed.    Today you received the following chemotherapy and/or immunotherapy agents - trastuzumab, pertuzumab      To help prevent nausea and vomiting after your treatment, we encourage you to take your nausea medication as directed.  BELOW ARE SYMPTOMS THAT SHOULD BE REPORTED IMMEDIATELY: *FEVER GREATER THAN 100.4 F (38 C) OR HIGHER *CHILLS OR SWEATING *NAUSEA AND VOMITING THAT IS NOT CONTROLLED WITH YOUR NAUSEA MEDICATION *UNUSUAL SHORTNESS OF BREATH *UNUSUAL BRUISING OR BLEEDING *URINARY PROBLEMS (pain or burning when urinating, or frequent urination) *BOWEL PROBLEMS (unusual diarrhea, constipation, pain near the anus) TENDERNESS IN MOUTH AND THROAT WITH OR WITHOUT PRESENCE OF ULCERS (sore throat, sores in mouth, or a toothache) UNUSUAL RASH, SWELLING OR PAIN  UNUSUAL VAGINAL DISCHARGE OR ITCHING   Items with * indicate a potential emergency and should be followed up as soon as possible or go to the Emergency Department if any problems should occur.  Please show the CHEMOTHERAPY ALERT CARD or IMMUNOTHERAPY ALERT  CARD at check-in to the Emergency Department and triage nurse.  Should you have questions after your visit or need to cancel or reschedule your appointment, please contact [facility]  [phone] and follow the prompts.  Office hours are 8:00 a.m. to 4:30 p.m. Monday - Friday. Please note that voicemails left after 4:00 p.m. may not be returned until the following business day.  We are closed weekends and major holidays. You have access to a nurse at all times for urgent questions. Please call the main number to the clinic [phone] and follow the prompts.  For any non-urgent questions, you may also contact your provider using MyChart. We now offer e-Visits for anyone 61 and older to request care online for non-urgent symptoms. For details visit [email or web address].   Also download the MyChart app! Go to the app store, search "MyChart", open the app, select [facility], and log in with your MyChart username and password.  Due to Covid, a mask is required upon entering the hospital/clinic. If you do not have a mask, one will be given to you upon arrival. For doctor visits, patients may have 1 support person aged 52 or older with them. For treatment visits, patients cannot have anyone with them due to current Covid guidelines and our immunocompromised population.   Trastuzumab injection for infusion What is this medication? TRASTUZUMAB (tras TOO zoo mab) is a monoclonal antibody. It is used to treatbreast cancer and stomach cancer. This medicine may be used for other purposes; ask your health care provider orpharmacist if you have questions. COMMON BRAND NAME(S): Herceptin, Belenda Cruise, Ogivri, Chalmers Guest  What should I tell my care team before I take this medication? They need to know if you have any of these conditions: heart disease heart failure lung or breathing disease, like asthma an unusual or allergic reaction to trastuzumab,  benzyl alcohol, or other medications, foods, dyes, or preservatives pregnant or trying to get pregnant breast-feeding How should I use this medication? This drug is given as an infusion into a vein. It is administered in a hospitalor clinic by a specially trained health care professional. Talk to your pediatrician regarding the use of this medicine in children. Thismedicine is not approved for use in children. Overdosage: If you think you have taken too much of this medicine contact apoison control center or emergency room at once. NOTE: This medicine is only for you. Do not share this medicine with others. What if I miss a dose? It is important not to miss a dose. Call your doctor or health careprofessional if you are unable to keep an appointment. What may interact with this medication? This medicine may interact with the following medications: certain types of chemotherapy, such as daunorubicin, doxorubicin, epirubicin, and idarubicin This list may not describe all possible interactions. Give your health care provider a list of all the medicines, herbs, non-prescription drugs, or dietary supplements you use. Also tell them if you smoke, drink alcohol, or use illegaldrugs. Some items may interact with your medicine. What should I watch for while using this medication? Visit your doctor for checks on your progress. Report any side effects. Continue your course of treatment even though you feel ill unless your doctortells you to stop. Call your doctor or health care professional for advice if you get a fever, chills or sore throat, or other symptoms of a cold or flu. Do not treatyourself. Try to avoid being around people who are sick. You may experience fever, chills and shaking during your first infusion. These effects are usually mild and can be treated with other medicines. Report any side effects during the infusion to your health care professional. Fever andchills usually do not happen with  later infusions. Do not become pregnant while taking this medicine or for 7 months after stopping it. Women should inform their doctor if they wish to become pregnant or think they might be pregnant. Women of child-bearing potential will need to have a negative pregnancy test before starting this medicine. There is a potential for serious side effects to an unborn child. Talk to your health care professional or pharmacist for more information. Do not breast-feed an infantwhile taking this medicine or for 7 months after stopping it. Women must use effective birth control with this medicine. What side effects may I notice from receiving this medication? Side effects that you should report to your doctor or health care professionalas soon as possible: allergic reactions like skin rash, itching or hives, swelling of the face, lips, or tongue chest pain or palpitations cough dizziness feeling faint or lightheaded, falls fever general ill feeling or flu-like symptoms signs of worsening heart failure like breathing problems; swelling in your legs and feet unusually weak or tired Side effects that usually do not require medical attention (report to yourdoctor or health care professional if they continue or are bothersome): bone pain changes in taste diarrhea joint pain nausea/vomiting weight loss This list may not describe all possible side effects. Call your doctor for medical advice about side effects. You may report side effects to FDA at1-800-FDA-1088. Where should I keep my medication? This drug is  given in a hospital or clinic and will not be stored at home. NOTE: This sheet is a summary. It may not cover all possible information. If you have questions about this medicine, talk to your doctor, pharmacist, orhealth care provider.  2022 Elsevier/Gold Standard (2016-10-03 14:37:52)  Pertuzumab injection What is this medication? PERTUZUMAB (per TOOZ ue mab) is a monoclonal antibody. It is  used to treatbreast cancer. This medicine may be used for other purposes; ask your health care provider orpharmacist if you have questions. COMMON BRAND NAME(S): PERJETA What should I tell my care team before I take this medication? They need to know if you have any of these conditions: heart disease heart failure high blood pressure history of irregular heart beat recent or ongoing radiation therapy an unusual or allergic reaction to pertuzumab, other medicines, foods, dyes, or preservatives pregnant or trying to get pregnant breast-feeding How should I use this medication? This medicine is for infusion into a vein. It is given by a health careprofessional in a hospital or clinic setting. Talk to your pediatrician regarding the use of this medicine in children.Special care may be needed. Overdosage: If you think you have taken too much of this medicine contact apoison control center or emergency room at once. NOTE: This medicine is only for you. Do not share this medicine with others. What if I miss a dose? It is important not to miss your dose. Call your doctor or health careprofessional if you are unable to keep an appointment. What may interact with this medication? Interactions are not expected. Give your health care provider a list of all the medicines, herbs, non-prescription drugs, or dietary supplements you use. Also tell them if you smoke, drink alcohol, or use illegal drugs. Some items may interact with yourmedicine. This list may not describe all possible interactions. Give your health care provider a list of all the medicines, herbs, non-prescription drugs, or dietary supplements you use. Also tell them if you smoke, drink alcohol, or use illegaldrugs. Some items may interact with your medicine. What should I watch for while using this medication? Your condition will be monitored carefully while you are receiving this medicine. Report any side effects. Continue your course of  treatment eventhough you feel ill unless your doctor tells you to stop. Do not become pregnant while taking this medicine or for 7 months after stopping it. Women should inform their doctor if they wish to become pregnant or think they might be pregnant. Women of child-bearing potential will need to have a negative pregnancy test before starting this medicine. There is a potential for serious side effects to an unborn child. Talk to your health care professional or pharmacist for more information. Do not breast-feed an infantwhile taking this medicine or for 7 months after stopping it. Women must use effective birth control with this medicine. Call your doctor or health care professional for advice if you get a fever, chills or sore throat, or other symptoms of a cold or flu. Do not treatyourself. Try to avoid being around people who are sick. You may experience fever, chills, and headache during the infusion. Report anyside effects during the infusion to your health care professional. What side effects may I notice from receiving this medication? Side effects that you should report to your doctor or health care professionalas soon as possible: breathing problems chest pain or palpitations dizziness feeling faint or lightheaded fever or chills skin rash, itching or hives sore throat swelling of the face, lips, or tongue  swelling of the legs or ankles unusually weak or tired Side effects that usually do not require medical attention (report to yourdoctor or health care professional if they continue or are bothersome): diarrhea hair loss nausea, vomiting tiredness This list may not describe all possible side effects. Call your doctor for medical advice about side effects. You may report side effects to FDA at1-800-FDA-1088. Where should I keep my medication? This drug is given in a hospital or clinic and will not be stored at home. NOTE: This sheet is a summary. It may not cover all possible  information. If you have questions about this medicine, talk to your doctor, pharmacist, orhealth care provider.  2022 Elsevier/Gold Standard (2015-11-11 12:08:50)

## 2021-05-20 NOTE — Progress Notes (Signed)
Patient reports new onset of bilateral hip pain when in bed side lying, also is experiencing bilateral flank pain that comes and goes she has the feeling that her bladder is not empty and at times and has to strain to begin urination.

## 2021-05-20 NOTE — Progress Notes (Signed)
Hematology/Oncology Consult note [facility]  Telephone:([phone] [phone]  Patient Care Team: Burnard Hawthorne, FNP as PCP - General (Family Medicine) Rico Junker, RN as Oncology Nurse Navigator   Name of the patient: Tricia Berry  [id number]  [birth year]   Date of visit: 05/20/21  Diagnosis- invasive mammary carcinoma of the left breast stage III ypT2 ypN2 cM0 ER/PR positive and HER2 positive by [facility]    Chief complaint/ Reason for visit-on treatment assessment prior to cycle 2 of adjuvant Herceptin and Perjeta  Heme/Onc history: patient is a 50 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  She then noticed that her lump is gradually getting larger with an area of ulceration on the skin and underwent a diagnostic bilateral mammogram on 09/08/2020 which showed hypoechoic interconnected masses in the left breast from 10:00 to 2 o'clock position spanning at least an area of 4.8 cm.  Ultrasound also showed 5 abnormal lymph nodes in the axilla with cortical thickening.  Both the mass and the lymph nodes were biopsied and was consistent with invasive mammary carcinoma grade 2.  Tumor was ER +91- 100%, PR +11 to 20% and HER-2 negative.  Ki-67 15%. Patient will be meeting [name] from [facility] surgery to discuss surgical management.  She is here for medical oncology recommendations.  In terms of her general health patient is otherwise doing well and does not have significant comorbidities.  She does endorse significant pain in the area of her left breast.  Tylenol and Motrin has not been helping her with this pain.  She lives with her husband and 2 children who have special needs.  No prior history of abnormal breast mammograms or breast biopsies.   PET CT scan showed hypermetabolism  in the area of the left breast but no evidence of hypermetabolism in the left axilla Or evidence of distant metastatic disease.   Neoadjuvant dose dense ACT chemotherapy started on 10/08/2020. Interim ultrasound after 4 cycles of dose dense AC chemotherapy showed overall decrease in volume of the tumor.  Nodularity previously seen on ultrasound was also decreased in size.   Patient completed neoadjuvant AC Taxol chemotherapy and underwent bilateral mastectomy with reconstruction.Final pathology showed fibrocystic changes in the right breast with no malignancy.  3.6 cm invasive mammary carcinoma in the left breast.  7 out of 16 lymph nodes positive for malignancy.  Treatment effect in the breast present but not robust.  Treatment effect in the lymph nodes minimal.  Margins negative.  Overall grade 2.  Extranodal extension present.  ER greater than 90% positive, PR 15% positive.  HER2 +2 equivocal and positive by FISH    Interval history-patient reports having bilateral flank pain and bilateral hip pain which has been ongoing for the last week or so.  Also reports having incomplete sensation of bladder emptying.  Denies any fever or burning urination.  She is currently recovering from her bilateral breast implants and has not started radiation treatment yet.  ECOG PS- 1 Pain scale- 3 Opioid associated constipation- no  Review of systems- Review of Systems  Constitutional:  Positive for malaise/fatigue. Negative for chills, fever and weight loss.  HENT:  Negative for congestion, ear discharge and nosebleeds.   Eyes:  Negative for blurred vision.  Respiratory:  Negative for cough,  hemoptysis, sputum production, shortness of breath and wheezing.   Cardiovascular:  Negative for chest pain, palpitations, orthopnea and claudication.  Gastrointestinal:  Negative for abdominal pain, blood in stool, constipation, diarrhea, heartburn, melena, nausea and vomiting.  Genitourinary:  Positive for frequency.  Negative for dysuria, flank pain, hematuria and urgency.  Musculoskeletal:  Negative for back pain, joint pain and myalgias.  Skin:  Negative for rash.  Neurological:  Negative for dizziness, tingling, focal weakness, seizures, weakness and headaches.  Endo/Heme/Allergies:  Does not bruise/bleed easily.  Psychiatric/Behavioral:  Negative for depression and suicidal ideas. The patient does not have insomnia.      Allergies  Allergen Reactions   Morphine Nausea And Vomiting and Other (See Comments)    migranes Other reaction(s): Headache Migraine, vomiting   Sertraline Hcl     REACTION: Worsened symptoms of IBS   Sulfa Antibiotics Rash   Sulfamethoxazole Rash   Sulfonamide Derivatives Rash     Past Medical History:  Diagnosis Date   Anxiety    Breast cancer ([place])    Diverticulitis    Family history of ovarian cancer    GERD (gastroesophageal reflux disease)    IBS (irritable bowel syndrome)    PONV (postoperative nausea and vomiting)    severe migraine and vomiting post anesthesia   Scoliosis      Past Surgical History:  Procedure Laterality Date   ABDOMINAL HYSTERECTOMY     still has ovaries, no gyn cancer, hysterectomy due to endometriosis. NO cervix on exam 01/10/21   BREAST BIOPSY Left 09/15/2020   [place] bx of mass, path pending, Q marker   BREAST BIOPSY Left 09/15/2020   [place] bx of LN, hydromarker, path pending   BREAST RECONSTRUCTION WITH PLACEMENT OF TISSUE EXPANDER AND FLEX HD (ACELLULAR HYDRATED DERMIS) Bilateral 03/17/2021   Procedure: IMMEDIATE BILATERAL BREAST RECONSTRUCTION WITH PLACEMENT OF TISSUE EXPANDER AND FLEX HD (ACELLULAR HYDRATED DERMIS);  Surgeon: Wallace Going, DO;  Location: [place];  Service: Plastics;  Laterality: Bilateral;   IR IMAGING GUIDED PORT INSERTION  10/01/2020   MODIFIED MASTECTOMY Left 03/17/2021   Procedure: LEFT MODIFIED RADICAL MASTECTOMY;  Surgeon: Erroll Luna, MD;  Location: [place];   Service: General;  Laterality: Left;   PORTA CATH REMOVAL Right 03/17/2021   Procedure: PORTA CATH REMOVAL;  Surgeon: Erroll Luna, MD;  Location: [place];  Service: General;  Laterality: Right;   REMOVAL OF BILATERAL TISSUE EXPANDERS WITH PLACEMENT OF BILATERAL BREAST IMPLANTS Bilateral 05/09/2021   Procedure: REMOVAL OF BILATERAL TISSUE EXPANDERS WITH PLACEMENT OF BILATERAL BREAST IMPLANTS;  Surgeon: Wallace Going, DO;  Location: [place];  Service: Plastics;  Laterality: Bilateral;  90 min   TOTAL MASTECTOMY Right 03/17/2021   Procedure: TOTAL MASTECTOMY;  Surgeon: Erroll Luna, MD;  Location: [place];  Service: General;  Laterality: Right;    Social History   Socioeconomic History   Marital status: Divorced    Spouse name: Not on file   Number of children: 2   Years of education: Not on file   Highest education level: Not on file  Occupational History   Occupation: Nurse    Employer: [employer]  Tobacco Use   Smoking status: Every Day    Packs/day: 0.25    Types: Cigarettes   Smokeless tobacco: Never   Tobacco comments:    Pt trying to quit now. Has reduced amount significantly  Vaping Use   Vaping Use: Never used  Substance and  Sexual Activity   Alcohol use: Not Currently   Drug use: No   Sexual activity: Not Currently    Birth control/protection: None  Other Topics Concern   Not on file  Social History Narrative   Patient works as an Warden/ranger at [employer]. She has 2 children at home who have special needs. She and her spouse are primary caregivers.    Social Determinants of Health   Financial Resource Strain: Not on file  Food Insecurity: Not on file  Transportation Needs: Not on file  Physical Activity: Not on file  Stress: Not on file  Social Connections: Not on file  Intimate Partner Violence: Not on file    Family History  Problem Relation Age of Onset   Hypertension Mother    Osteoarthritis  Mother    Diverticulitis Mother    Heart failure Father    Hypertension Father    Gout Father    Diverticulitis Brother    Ovarian cancer Paternal Grandmother      Current Outpatient Medications:    diazepam (VALIUM) 2 MG tablet, Take 1 tablet (2 mg total) by mouth every 12 (twelve) hours as needed for muscle spasms., Disp: 20 tablet, Rfl: 1   DULoxetine (CYMBALTA) 60 MG capsule, Take 1 capsule (60 mg total) by mouth daily., Disp: 90 capsule, Rfl: 3   Lactobacillus (PROBIOTIC ACIDOPHILUS PO), Take 1 capsule by mouth daily., Disp: , Rfl:    Multiple Vitamins-Minerals (MULTIVITAL PO), Take 1 Dose by mouth daily., Disp: , Rfl:    nicotine (NICODERM CQ - DOSED IN MG/24 HOURS) 21 mg/24hr patch, use 1 patch daily, Disp: 28 patch, Rfl: 0   nicotine (NICOTROL) 10 MG inhaler, FOLLOW INSTRUCTIONS ON PACKAGE AS NEEDED FOR SMOKING CESSATION, Disp: 168 each, Rfl: 0   nicotine polacrilex (SM NICOTINE POLACRILEX) 2 MG lozenge, Take by mouth., Disp: 72 lozenge, Rfl: 0   ondansetron (ZOFRAN) 4 MG tablet, Take 1 tablet (4 mg total) by mouth every 8 (eight) hours as needed for nausea or vomiting., Disp: 20 tablet, Rfl: 0   oxyCODONE (OXY IR/ROXICODONE) 5 MG immediate release tablet, 1-2 tablets every 6 hours as needed for pain, Disp: 240 tablet, Rfl: 0   traZODone (DESYREL) 50 MG tablet, Take 0.5-1 tablets (25-50 mg total) by mouth at bedtime as needed for sleep. (Patient not taking: Reported on 05/20/2021), Disp: 30 tablet, Rfl: 3 No current facility-administered medications for this visit.  Facility-Administered Medications Ordered in Other Visits:    goserelin (ZOLADEX) injection 3.6 mg, 3.6 mg, Subcutaneous, Q28 days, Sindy Guadeloupe, MD, 3.6 mg at 05/04/21 1150   heparin lock flush 100 unit/mL, 500 Units, Intravenous, Once, Sindy Guadeloupe, MD   sodium chloride flush (NS) 0.9 % injection 10 mL, 10 mL, Intracatheter, PRN, Sindy Guadeloupe, MD  Physical exam:  Vitals:   05/20/21 0928  BP: 129/78  Pulse:  87  Resp: 20  Temp: (!) 97.5 F (36.4 C)  TempSrc: Tympanic  SpO2: 99%  Weight: 113 lb 9.6 oz (51.5 kg)   Physical Exam Constitutional:      General: She is not in acute distress. Cardiovascular:     Rate and Rhythm: Normal rate and regular rhythm.     Heart sounds: Normal heart sounds.  Pulmonary:     Effort: Pulmonary effort is normal.     Breath sounds: Normal breath sounds.  Skin:    General: Skin is warm and dry.  Neurological:     Mental Status: She is alert and  oriented to person, place, and time.  Patient is s/p bilateral mastectomy with reconstruction.  Surgical scar has healed well and there is still dressing in place.  CMP Latest Ref Rng & Units 04/27/2021  Glucose 70 - 99 mg/dL 114(H)  BUN 6 - 20 mg/dL <5(L)  Creatinine 0.44 - 1.00 mg/dL 0.72  Sodium 135 - 145 mmol/L 137  Potassium 3.5 - 5.1 mmol/L 4.0  Chloride 98 - 111 mmol/L 103  CO2 22 - 32 mmol/L 25  Calcium 8.9 - 10.3 mg/dL 9.4  Total Protein 6.5 - 8.1 g/dL 7.0  Total Bilirubin 0.3 - 1.2 mg/dL 0.5  Alkaline Phos 38 - 126 U/L 92  AST 15 - 41 U/L 25  ALT 0 - 44 U/L 17   CBC Latest Ref Rng & Units 04/27/2021  WBC 4.0 - 10.5 K/uL 9.0  Hemoglobin 12.0 - 15.0 g/dL 14.7  Hematocrit 36.0 - 46.0 % 44.7  Platelets 150 - 400 K/uL 254    No images are attached to the encounter.  ECHOCARDIOGRAM COMPLETE  Result Date: 04/28/2021    ECHOCARDIOGRAM REPORT   Patient Name:   Tricia Berry Date of Exam: 04/28/2021 Medical Rec #:  [id number]           Height:       60.0 in Accession #:    [id number]          Weight:       116.7 lb Date of Birth:  [birth year]           BSA:          1.484 m Patient Age:    72 years            BP:           111/74 mmHg Patient Gender: F                   HR:           74 bpm. Exam Location:  [facility] Procedure: 2D Echo, Color Doppler, Cardiac Doppler and Strain Analysis Indications:     Z09 Chemo  History:         Patient has prior history of Echocardiogram examinations, most                   recent 10/05/2020. Malignant neoplasm of upper-outer quadrant                  of left breast in female, estrogen receptor positive.  Sonographer:     Charmayne Sheer RDCS (AE) Referring Phys:  [id number] Weston Anna Ladashia Demarinis Diagnosing Phys: Ida Rogue MD  Sonographer Comments: Technically difficult study due to poor echo windows. Image acquisition difficult due to mastectomy w/ implants. Global longitudinal strain was attempted. IMPRESSIONS  1. Left ventricular ejection fraction, by estimation, is 60 to 65%. The left ventricle has normal function. The left ventricle has no regional wall motion abnormalities. Left ventricular diastolic parameters are consistent with Grade I diastolic dysfunction (impaired relaxation). The average left ventricular global longitudinal strain is -21.0 %. The global longitudinal strain is normal.  2. Right ventricular systolic function is normal. The right ventricular size is normal. Tricuspid regurgitation signal is inadequate for assessing PA pressure. FINDINGS  Left Ventricle: Left ventricular ejection fraction, by estimation, is 60 to 65%. The left ventricle has normal function. The left ventricle has no regional wall motion abnormalities. The average left ventricular global longitudinal strain is -21.0 %. The global longitudinal strain is  normal. The left ventricular internal cavity size was normal in size. There is no left ventricular hypertrophy. Left ventricular diastolic parameters are consistent with Grade I diastolic dysfunction (impaired relaxation). Right Ventricle: The right ventricular size is normal. No increase in right ventricular wall thickness. Right ventricular systolic function is normal. Tricuspid regurgitation signal is inadequate for assessing PA pressure. Left Atrium: Left atrial size was normal in size. Right Atrium: Right atrial size was normal in size. Pericardium: There is no evidence of pericardial effusion. Mitral Valve: The mitral valve is normal in structure. No  evidence of mitral valve regurgitation. No evidence of mitral valve stenosis. MV peak gradient, 2.5 mmHg. The mean mitral valve gradient is 1.0 mmHg. Tricuspid Valve: The tricuspid valve is normal in structure. Tricuspid valve regurgitation is not demonstrated. No evidence of tricuspid stenosis. Aortic Valve: The aortic valve is normal in structure. Aortic valve regurgitation is not visualized. No aortic stenosis is present. Aortic valve mean gradient measures 5.0 mmHg. Aortic valve peak gradient measures 8.4 mmHg. Aortic valve area, by VTI measures 2.27 cm. Pulmonic Valve: The pulmonic valve was normal in structure. Pulmonic valve regurgitation is not visualized. No evidence of pulmonic stenosis. Aorta: The aortic root is normal in size and structure. Venous: The inferior vena cava is normal in size with greater than 50% respiratory variability, suggesting right atrial pressure of 3 mmHg. IAS/Shunts: No atrial level shunt detected by color flow Doppler.  LEFT VENTRICLE PLAX 2D LVIDd:         3.60 cm  Diastology LVIDs:         2.50 cm  LV e' medial:    8.70 cm/s LV PW:         1.00 cm  LV E/e' medial:  7.2 LV IVS:        0.70 cm  LV e' lateral:   10.10 cm/s LVOT diam:     2.00 cm  LV E/e' lateral: 6.2 LV SV:         61 LV SV Index:   41       2D Longitudinal Strain LVOT Area:     3.14 cm 2D Strain GLS Avg:     -21.0 %  RIGHT VENTRICLE RV Basal diam:  3.10 cm TAPSE (M-mode): 2.2 cm LEFT ATRIUM             Index       RIGHT ATRIUM           Index LA diam:        2.50 cm 1.68 cm/m  RA Area:     11.90 cm LA Vol (A2C):   21.3 ml 14.35 ml/m RA Volume:   26.90 ml  18.12 ml/m LA Vol (A4C):   33.5 ml 22.57 ml/m LA Biplane Vol: 28.7 ml 19.33 ml/m  AORTIC VALVE                    PULMONIC VALVE AV Area (Vmax):    2.25 cm     PV Vmax:       0.69 m/s AV Area (Vmean):   2.21 cm     PV Vmean:      49.600 cm/s AV Area (VTI):     2.27 cm     PV VTI:        0.132 m AV Vmax:           145.00 cm/s  PV Peak grad:  1.9 mmHg AV  Vmean:  101.000 cm/s PV Mean grad:  1.0 mmHg AV VTI:            0.267 m AV Peak Grad:      8.4 mmHg AV Mean Grad:      5.0 mmHg LVOT Vmax:         104.00 cm/s LVOT Vmean:        70.900 cm/s LVOT VTI:          0.193 m LVOT/AV VTI ratio: 0.72  AORTA Ao Root diam: 2.30 cm MITRAL VALVE MV Area (PHT): 4.44 cm    SHUNTS MV Area VTI:   3.26 cm    Systemic VTI:  0.19 m MV Peak grad:  2.5 mmHg    Systemic Diam: 2.00 cm MV Mean grad:  1.0 mmHg MV Vmax:       0.79 m/s MV Vmean:      46.7 cm/s MV Decel Time: 171 msec MV E velocity: 63.00 cm/s MV A velocity: 81.40 cm/s MV E/A ratio:  0.77 Ida Rogue MD Electronically signed by Ida Rogue MD Signature Date/Time: 04/28/2021/4:19:49 PM    Final      Assessment and plan- Patient is a 50 y.o. female with known history of locally advanced left breast cancer which was ER/PR positive and HER2 negative on initial biopsy s/p neoadjuvant AC Taxol chemotherapy final pathology showed 3.6 cm residual breast mass with 7 positive lymph nodes with extracapsular extension.  ER/PR positive and HER2 positive.  She is here for on treatment assessment prior to cycle 2 of adjuvant Herceptin and Perjeta  We are still waiting on HER2 testing on her lymph node specimen to decide if we are continuing with Herceptin and Perjeta versus switching her to Kadcyla.  Her port was taken out at the time of surgery but depending on whether she will be getting Herceptin and Perjeta versus Kadcyla we will decide about putting the port back in.  Hopefully will have those answers in the next 1 week.  Patient is currently on Zoladex for ovarian suppression and will receive her next dose on 06/01/2021.  I will tentatively see her back in 3 weeks for cycle 3 of Herceptin and Perjeta.  I will plan to start her on Arimidex after she completes radiation treatment and has received 3-4 doses of Zoladex.  Patient will need to take calcium 1200 mg along with vitamin D 800 international units  prophylactically.  Patient will also benefit from adjuvant bisphosphonates and discussed risks and benefits of bisphosphonates including all but not limited to fatigue, hypocalcemia and possible risk of osteonecrosis of the jaw.  Patient has full dentures and therefore does not require any dental clearance.  We will plan to start Zometa as well in about 6 weeks time.  Patient continues to complain of diffuse body aches as well as pain following her recent reconstruction.  She is currently on as needed oxycodone.  I explained to the patient that I will not be able to give her long-term oxycodone but willing to renew her prescription for the next 3 months and until then she will have to slowly wean herself off pain medications.   Visit Diagnosis 1. Frequent urination   2. Encounter for monoclonal antibody treatment for malignancy   3. Malignant neoplasm of upper-outer quadrant of left breast in female, estrogen receptor positive ([place])      Dr. Randa Evens, MD, MPH [facility] [phone] 05/20/2021 4:22 PM

## 2021-05-23 ENCOUNTER — Ambulatory Visit: Payer: No Typology Code available for payment source

## 2021-05-23 LAB — URINE CULTURE: Culture: 70000 — AB

## 2021-05-24 ENCOUNTER — Telehealth: Payer: Self-pay | Admitting: Oncology

## 2021-05-24 ENCOUNTER — Ambulatory Visit: Payer: No Typology Code available for payment source

## 2021-05-24 ENCOUNTER — Other Ambulatory Visit: Payer: Self-pay

## 2021-05-24 ENCOUNTER — Other Ambulatory Visit: Payer: Self-pay | Admitting: *Deleted

## 2021-05-24 MED ORDER — PREGABALIN 75 MG PO CAPS: 75.0000 mg | ORAL_CAPSULE | Freq: Two times a day (BID) | ORAL | 1 refills | Status: DC

## 2021-05-24 MED ORDER — ONDANSETRON HCL 4 MG PO TABS: 4.0000 mg | ORAL_TABLET | Freq: Three times a day (TID) | ORAL | 0 refills | Status: DC | PRN

## 2021-05-24 MED ORDER — OXYCODONE HCL ER 10 MG PO T12A: 10.0000 mg | EXTENDED_RELEASE_TABLET | Freq: Four times a day (QID) | ORAL | 0 refills | Status: DC | PRN

## 2021-05-24 NOTE — Telephone Encounter (Signed)
Patient called to report that her pain medication prescription needs to be changed since there is problem with her insurance approving the quantity. Forward message to primary team to follow-up in a.m.

## 2021-05-24 NOTE — Telephone Encounter (Signed)
Surgery was over 2 months ago. I am unable to increase frequency to Q4 hours prn. I can give her oxycodone 10 mg Q6 prn that we can send over. She will need to take it only as prescribed. I will not be able to refill her pain meds ahead of time moving forward. I do want her to come off pain medications in 3-4 months. For pain in her legs- if cymbalta is not helping, we can add gabapentin or lyrica

## 2021-05-24 NOTE — Telephone Encounter (Signed)
Patient called and reports that she is out of her Oxycodone and has been since yesterday( she got 240 Oxycodone 5 mg tabs on 04/27/21). She reports that she takes 2 tabs every 6 hours for the pain in her legs, but she just had surgery and had taken 2 tabs at time every 4- 5 hours when the pain got intense. The pharmacy will not let her have a refill if one is sent until Friday. Would you be willing to refill prescription and change directions to have more frequently since she had surgery (prescription had been given for some pain medicine, but pharmacy refused to fill it since she had Oxycodone and so plastic surgeon told her to just use the Oxycodone she had on hand) and since she is taking 2 tabs each dose change strength to 10 mg tablet? She has been using heat and lidocaine patches on her legs since being out of her pain medicine as well as Advil Liquid Gels, but none of this is very effective on her pain. Please advise

## 2021-05-25 ENCOUNTER — Telehealth: Payer: Self-pay

## 2021-05-25 ENCOUNTER — Encounter: Payer: Self-pay | Admitting: Oncology

## 2021-05-25 ENCOUNTER — Other Ambulatory Visit: Payer: Self-pay | Admitting: *Deleted

## 2021-05-25 ENCOUNTER — Ambulatory Visit: Payer: No Typology Code available for payment source

## 2021-05-25 ENCOUNTER — Other Ambulatory Visit: Payer: Self-pay

## 2021-05-25 MED ORDER — AMOXICILLIN-POT CLAVULANATE 875-125 MG PO TABS: 1.0000 | ORAL_TABLET | Freq: Two times a day (BID) | ORAL | 0 refills | Status: DC

## 2021-05-25 MED ORDER — OXYCODONE HCL 10 MG PO TABS: 10.0000 mg | ORAL_TABLET | Freq: Four times a day (QID) | ORAL | 0 refills | Status: DC | PRN

## 2021-05-25 NOTE — Telephone Encounter (Signed)
We changed the dose from 5 mg 1-2 tab to 10 mg Q6 prn. Usually pharmacy fills it if it is a different dose. But we are not changing the frequency at this time

## 2021-05-25 NOTE — Telephone Encounter (Signed)
We will take care of It and send pt a message

## 2021-05-25 NOTE — Telephone Encounter (Signed)
I spoke with patient and advised regarding Dr Elroy Channel response. She reports that there is a problem with the Oxycodone prescription in that the extended release was ordered instead of the immediate release, so she still does not have any pain medicine at this time . She also states that she got prescription for Lyrica, but is asking if she is to stop the Cymbalta and if so, how does she make the change form Cymbalta to Lyrica or does she take both. I also advised that Dr Janese Banks can not refill her medicine early going forward and that she wants patient to come off the pain medications in the next 3 - 4 months.  She also said that Ardelle Park called her and told her that she has a UTI and antibiotics were being ordered, but there is no prescription for antibiotics at pharmacy. She states she does not see Nitrites in her urine.   Please advise

## 2021-05-25 NOTE — Telephone Encounter (Signed)
What about the Cybalta and does she need antibiotics for ?UTI

## 2021-05-25 NOTE — Telephone Encounter (Signed)
Called pharmacy, spoke with the pharmacy to find out the issue  with rx, sent int the oxycodone 10 mg regular, also called the patient and let her know that Dr. Janese Banks would not prescribe an antibiotic at this time and recommended to come in for repeat u/a, pt states she will wait for her next appt to do the ua states she still has some urinary hesitancy, no burn or itching at this time. She will stop the cymbalta and starting the lyrica as directed. [facility]

## 2021-05-26 ENCOUNTER — Ambulatory Visit: Payer: No Typology Code available for payment source

## 2021-05-27 ENCOUNTER — Ambulatory Visit: Payer: No Typology Code available for payment source

## 2021-05-30 ENCOUNTER — Encounter: Payer: Self-pay | Admitting: Oncology

## 2021-05-30 ENCOUNTER — Ambulatory Visit: Payer: No Typology Code available for payment source

## 2021-05-31 ENCOUNTER — Ambulatory Visit: Payer: No Typology Code available for payment source

## 2021-05-31 ENCOUNTER — Other Ambulatory Visit: Payer: Self-pay

## 2021-05-31 ENCOUNTER — Encounter: Payer: Self-pay | Admitting: Physical Therapy

## 2021-05-31 ENCOUNTER — Ambulatory Visit: Payer: No Typology Code available for payment source | Attending: Surgery | Admitting: Physical Therapy

## 2021-05-31 DIAGNOSIS — Z483 Aftercare following surgery for neoplasm: Secondary | ICD-10-CM | POA: Insufficient documentation

## 2021-05-31 DIAGNOSIS — R293 Abnormal posture: Secondary | ICD-10-CM | POA: Diagnosis present

## 2021-05-31 DIAGNOSIS — C50912 Malignant neoplasm of unspecified site of left female breast: Secondary | ICD-10-CM | POA: Insufficient documentation

## 2021-05-31 DIAGNOSIS — M25611 Stiffness of right shoulder, not elsewhere classified: Secondary | ICD-10-CM | POA: Diagnosis present

## 2021-05-31 DIAGNOSIS — Z170 Estrogen receptor positive status [ER+]: Secondary | ICD-10-CM | POA: Insufficient documentation

## 2021-05-31 DIAGNOSIS — M25612 Stiffness of left shoulder, not elsewhere classified: Secondary | ICD-10-CM | POA: Diagnosis present

## 2021-05-31 NOTE — Therapy (Signed)
[place], [place], [address] Phone: [phone]   Fax:  [phone]  Physical Therapy Treatment  Patient Details  Name: Tricia Berry MRN: [id number] Date of Birth: 04/16/1971 Referring Provider (PT): Cornett   Encounter Date: 05/31/2021   PT End of Session - 05/31/21 1043     Visit Number 7    Number of Visits 8    Date for PT Re-Evaluation 08/09/21    PT Start Time 1005    PT Stop Time 1040    PT Time Calculation (min) 35 min    Activity Tolerance Patient tolerated treatment well    Behavior During Therapy [facility] for tasks assessed/performed             Past Medical History:  Diagnosis Date   Anxiety    Breast cancer ([place])    Diverticulitis    Family history of ovarian cancer    GERD (gastroesophageal reflux disease)    IBS (irritable bowel syndrome)    PONV (postoperative nausea and vomiting)    severe migraine and vomiting post anesthesia   Scoliosis     Past Surgical History:  Procedure Laterality Date   ABDOMINAL HYSTERECTOMY     still has ovaries, no gyn cancer, hysterectomy due to endometriosis. NO cervix on exam 01/10/21   BREAST BIOPSY Left 09/15/2020   [place] bx of mass, path pending, Q marker   BREAST BIOPSY Left 09/15/2020   [place] bx of LN, hydromarker, path pending   BREAST RECONSTRUCTION WITH PLACEMENT OF TISSUE EXPANDER AND FLEX HD (ACELLULAR HYDRATED DERMIS) Bilateral 03/17/2021   Procedure: IMMEDIATE BILATERAL BREAST RECONSTRUCTION WITH PLACEMENT OF TISSUE EXPANDER AND FLEX HD (ACELLULAR HYDRATED DERMIS);  Surgeon: Wallace Going, DO;  Location: [place];  Service: Plastics;  Laterality: Bilateral;   IR IMAGING GUIDED PORT INSERTION  10/01/2020   MODIFIED MASTECTOMY Left 03/17/2021   Procedure: LEFT MODIFIED RADICAL MASTECTOMY;  Surgeon: Erroll Luna, MD;  Location: [place];  Service: General;  Laterality: Left;   PORTA CATH  REMOVAL Right 03/17/2021   Procedure: PORTA CATH REMOVAL;  Surgeon: Erroll Luna, MD;  Location: [place];  Service: General;  Laterality: Right;   REMOVAL OF BILATERAL TISSUE EXPANDERS WITH PLACEMENT OF BILATERAL BREAST IMPLANTS Bilateral 05/09/2021   Procedure: REMOVAL OF BILATERAL TISSUE EXPANDERS WITH PLACEMENT OF BILATERAL BREAST IMPLANTS;  Surgeon: Wallace Going, DO;  Location: [place];  Service: Plastics;  Laterality: Bilateral;  90 min   TOTAL MASTECTOMY Right 03/17/2021   Procedure: TOTAL MASTECTOMY;  Surgeon: Erroll Luna, MD;  Location: [place];  Service: General;  Laterality: Right;    There were no vitals filed for this visit.   Subjective Assessment - 05/31/21 1008     Subjective My surgery went well and I did not really have any pain. I have a little tightness but not bad.    Pertinent History L breast cancer ER+/PR+ HER2-, stage III, will undergo a bilateral mastectomy and ALND on L (7/16) on 03/17/21, neuropathy of hands, lower legs and feet    Patient Stated Goals to gain info from providers    Currently in Pain? No/denies    Pain Score 0-No pain                [facility] PT Assessment - 05/31/21 0001       AROM   Right Shoulder Flexion 180 Degrees    Right Shoulder ABduction 180  Degrees    Left Shoulder Flexion 180 Degrees    Left Shoulder ABduction 180 Degrees                                   PT Education - 05/31/21 1047     Education Details commericially available hand strengtheners, Moving Through Cancer book, lymphedema risk reduction practices, importance of activity including walking and stretching throughout radiation    Person(s) Educated Patient    Methods Explanation;Handout    Comprehension Verbalized understanding                 PT Long Term Goals - 05/31/21 1011       PT LONG TERM GOAL #1   Title Pt will return to baseline shoulder ROM  measurements and not demonstrate any signs or symptoms of lymphedema.    Baseline 05/31/21- pt has 180 degrees of flex and abd bilaterally    Time 4    Period Weeks    Status Achieved      PT LONG TERM GOAL #2   Title Pt will demonstrate 165 degrees of bilateral flexion to allow pt to reach overhead.    Baseline R 143 L 101; 05/02/21- 172 on R, 175 on L    Time 4    Period Weeks    Status Achieved      PT LONG TERM GOAL #3   Title Pt will demonstrate 165 degrees of bilateral shoulder abduction to allow her to reach out the side    Baseline R 152 L 78; 05/02/21- R 180 L 179    Time 4    Period Weeks    Status Achieved      PT LONG TERM GOAL #4   Title Pt will report she is no longer having muscle spasms across her chest to allow improved comfort    Baseline 05/02/21- pt reports she is no longer having muscle spasms    Time 4    Period Weeks    Status Achieved      PT LONG TERM GOAL #5   Title Pt will be independent in a home exercise program for long term strengthening and stretching.    Time 4    Period Weeks    Status Achieved      Additional Long Term Goals   Additional Long Term Goals Yes      PT LONG TERM GOAL #6   Title Pt will report a 50% improvement in edema in L trunk and chest to allow improved comfort.    Baseline 05/02/21- 20% improved; 05/31/21 no edema present    Time 4    Period Weeks    Status Achieved      PT LONG TERM GOAL #7   Title Pt will be independent in Strength ABC program for long term stretching and strengthening after completion of radiation.    Time 10    Period Weeks    Status New    Target Date 08/09/21                   Plan - 05/31/21 1044     Clinical Impression Statement Remeasured pt's ROM following her implant placement. She has full ROM of bilateral shoulders. She is doing very well and not demonstrating any signs of lymphedema. She has met her goals for therapy. Spent session educating pt on hand strengtheners available on  [employer] as pt  reports she has neuropathy and weakness in her hands. Educated pt on how to purchase and use hand strengtheners to build hand strength. Educated pt on importance of stretching daily and staying active throughout radiation which she will begin in the next few weeks. Answered pt's questions about activity during radiation and ways to reduce lymphedema risk. Issued lymphedema risk reduction handout. Will place pt on hold throughout radiation and then reassess once she completes radiation and instruct pt in Strength ABC program at that time.    PT Frequency Other (comment)   re eval in 10 weeks after completion of radiation   PT Duration --   10 weeks   PT Treatment/Interventions ADLs/Self Care Home Management;Therapeutic exercise;Patient/family education;Scar mobilization;Passive range of motion;Manual techniques;Manual lymph drainage;Compression bandaging;Taping;Vasopneumatic Device    PT Next Visit Plan reassess after radiation and instruct in Strength ABC program    PT Home Exercise Plan post op breast exercises, instructed pt in supine dowel, supine scap series    Consulted and Agree with Plan of Care Patient             Patient will benefit from skilled therapeutic intervention in order to improve the following deficits and impairments:  Pain, Postural dysfunction, Decreased knowledge of precautions, Impaired UE functional use, Increased fascial restricitons, Decreased strength, Decreased range of motion, Decreased scar mobility, Increased edema, Increased muscle spasms  Visit Diagnosis: Stiffness of left shoulder, not elsewhere classified  Aftercare following surgery for neoplasm  Abnormal posture  Stiffness of right shoulder, not elsewhere classified  Malignant 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, estrogen receptor positive, unspecified site of breast [facility])     Problem List Patient Active Problem List   Diagnosis Date Noted   Breast cancer ([place]) 03/17/2021    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   Genetic testing 11/22/2020   Family history of ovarian cancer    Candidal vulvovaginitis 11/01/2020   Low back pain 10/06/2020   Encounter for medical examination to establish care 09/26/2020   Malignant neoplasm of upper-outer quadrant of left breast in female, estrogen receptor positive ([place]) 09/26/2020   BACK STRAIN, LUMBAR 03/08/2010   INTERNAL HEMORRHOIDS 01/27/2010   ANAL FISSURE 01/27/2010   OSTEOARTHRITIS, HANDS, BILATERAL 01/04/2010   ARTHRALGIA 10/05/2009   INSOMNIA, CHRONIC 05/06/2009   ALLERGIC RHINITIS, SEASONAL 02/09/2009   TOBACCO ABUSE 12/29/2008   IBS 10/13/2008   Anxiety state 08/25/2008   DEPRESSION, RECURRENT 08/25/2008    Allyson Sabal [facility] 05/31/2021, 10:50 AM  [place], [place], [address] Phone: [phone]   Fax:  [phone]  Name: LINDALEE HUIZINGA MRN: [id number] Date of Birth: 01/29/1971   Manus Gunning, PT 05/31/21 10:51 AM

## 2021-06-01 ENCOUNTER — Inpatient Hospital Stay: Payer: No Typology Code available for payment source | Attending: Oncology

## 2021-06-01 ENCOUNTER — Ambulatory Visit: Payer: No Typology Code available for payment source

## 2021-06-01 DIAGNOSIS — Z170 Estrogen receptor positive status [ER+]: Secondary | ICD-10-CM

## 2021-06-01 DIAGNOSIS — Z79899 Other long term (current) drug therapy: Secondary | ICD-10-CM | POA: Diagnosis not present

## 2021-06-01 DIAGNOSIS — Z5111 Encounter for antineoplastic chemotherapy: Secondary | ICD-10-CM | POA: Diagnosis not present

## 2021-06-01 DIAGNOSIS — C50412 Malignant neoplasm of upper-outer quadrant of left female breast: Secondary | ICD-10-CM | POA: Diagnosis present

## 2021-06-01 MED ORDER — GOSERELIN ACETATE 3.6 MG ~~LOC~~ IMPL
3.6000 mg | DRUG_IMPLANT | SUBCUTANEOUS | Status: DC
  Administered 2021-06-01: 3.6 mg via SUBCUTANEOUS
  Filled 2021-06-01: qty 3.6

## 2021-06-02 ENCOUNTER — Ambulatory Visit: Payer: No Typology Code available for payment source

## 2021-06-02 ENCOUNTER — Encounter: Payer: Self-pay | Admitting: Physical Therapy

## 2021-06-02 LAB — SURGICAL PATHOLOGY

## 2021-06-03 ENCOUNTER — Ambulatory Visit: Payer: No Typology Code available for payment source

## 2021-06-06 ENCOUNTER — Encounter: Payer: Self-pay | Admitting: Physical Therapy

## 2021-06-06 ENCOUNTER — Ambulatory Visit: Payer: No Typology Code available for payment source

## 2021-06-07 ENCOUNTER — Ambulatory Visit: Payer: No Typology Code available for payment source

## 2021-06-07 ENCOUNTER — Encounter: Payer: Self-pay | Admitting: Surgery

## 2021-06-07 ENCOUNTER — Ambulatory Visit (INDEPENDENT_AMBULATORY_CARE_PROVIDER_SITE_OTHER): Payer: No Typology Code available for payment source | Admitting: Surgical

## 2021-06-07 ENCOUNTER — Encounter: Payer: Self-pay | Admitting: Surgical

## 2021-06-07 ENCOUNTER — Other Ambulatory Visit: Payer: Self-pay

## 2021-06-07 DIAGNOSIS — C50412 Malignant neoplasm of upper-outer quadrant of left female breast: Secondary | ICD-10-CM

## 2021-06-07 DIAGNOSIS — Z170 Estrogen receptor positive status [ER+]: Secondary | ICD-10-CM

## 2021-06-07 NOTE — Progress Notes (Signed)
Patient is a 50 year old female here for follow-up after removal of bilateral tissue expanders and placement of bilateral breast implants with Dr. Marla Roe on 05/09/2021.  She is 1 month postop.  She reports overall she is doing well, still having some tenderness under bilateral axilla but this is not too significant.  She reports that she is starting radiation soon for left breast, it is scheduled for 6 weeks.  She is interested in fat grafting to bilateral breasts, we will plan for this 3 to 6 months after radiation. She is not having any infectious symptoms, she is overall very pleased with her reconstruction at this point.  Chaperone present on exam On exam bilateral breast incisions are intact, no subcutaneous fluid collections, no erythema, no cellulitic changes.  No significant tenderness noted with palpation.   There is no sign of infection, recommend following up a few weeks after completion of radiation.  Discussed with patient if she has any issues during radiation such as wounds we would be happy to see her at any point.  Picture was taken and placed in the patient's chart with patient's permission.  Follow-up scheduled.  Recommend calling with questions or concerns.

## 2021-06-08 ENCOUNTER — Ambulatory Visit: Payer: No Typology Code available for payment source

## 2021-06-08 ENCOUNTER — Ambulatory Visit
Admission: RE | Admit: 2021-06-08 | Discharge: 2021-06-08 | Disposition: A | Discharge status: Home / Self Care | Payer: No Typology Code available for payment source | Source: Ambulatory Visit | Attending: Radiation Oncology | Admitting: Radiation Oncology

## 2021-06-08 DIAGNOSIS — Z170 Estrogen receptor positive status [ER+]: Secondary | ICD-10-CM | POA: Insufficient documentation

## 2021-06-08 DIAGNOSIS — Z510 Encounter for antineoplastic radiation therapy: Secondary | ICD-10-CM | POA: Diagnosis present

## 2021-06-08 DIAGNOSIS — C50412 Malignant neoplasm of upper-outer quadrant of left female breast: Secondary | ICD-10-CM | POA: Diagnosis not present

## 2021-06-09 ENCOUNTER — Ambulatory Visit: Payer: No Typology Code available for payment source

## 2021-06-09 DIAGNOSIS — Z510 Encounter for antineoplastic radiation therapy: Secondary | ICD-10-CM | POA: Diagnosis not present

## 2021-06-10 ENCOUNTER — Ambulatory Visit: Payer: No Typology Code available for payment source

## 2021-06-10 ENCOUNTER — Inpatient Hospital Stay: Payer: No Typology Code available for payment source

## 2021-06-10 ENCOUNTER — Inpatient Hospital Stay (HOSPITAL_BASED_OUTPATIENT_CLINIC_OR_DEPARTMENT_OTHER): Payer: No Typology Code available for payment source | Admitting: Oncology

## 2021-06-10 ENCOUNTER — Encounter: Payer: Self-pay | Admitting: Oncology

## 2021-06-10 VITALS — BP 118/84 | HR 88 | Temp 97.0°F | Resp 16 | Wt 114.7 lb

## 2021-06-10 DIAGNOSIS — C50412 Malignant neoplasm of upper-outer quadrant of left female breast: Secondary | ICD-10-CM

## 2021-06-10 DIAGNOSIS — Z170 Estrogen receptor positive status [ER+]: Secondary | ICD-10-CM

## 2021-06-10 DIAGNOSIS — T451X5A Adverse effect of antineoplastic and immunosuppressive drugs, initial encounter: Secondary | ICD-10-CM

## 2021-06-10 DIAGNOSIS — G620 Drug-induced polyneuropathy: Secondary | ICD-10-CM

## 2021-06-10 DIAGNOSIS — R112 Nausea with vomiting, unspecified: Secondary | ICD-10-CM

## 2021-06-10 DIAGNOSIS — Z5112 Encounter for antineoplastic immunotherapy: Secondary | ICD-10-CM

## 2021-06-10 DIAGNOSIS — T50905A Adverse effect of unspecified drugs, medicaments and biological substances, initial encounter: Secondary | ICD-10-CM

## 2021-06-10 DIAGNOSIS — Z5111 Encounter for antineoplastic chemotherapy: Secondary | ICD-10-CM | POA: Diagnosis not present

## 2021-06-10 MED ORDER — ACETAMINOPHEN 325 MG PO TABS
650.0000 mg | ORAL_TABLET | Freq: Once | ORAL | Status: AC
  Administered 2021-06-10: 650 mg via ORAL
  Filled 2021-06-10: qty 2

## 2021-06-10 MED ORDER — LORAZEPAM 0.5 MG PO TABS: 0.5000 mg | ORAL_TABLET | Freq: Every evening | ORAL | 0 refills | Status: DC | PRN

## 2021-06-10 MED ORDER — LIDOCAINE-PRILOCAINE 2.5-2.5 % EX CREA: 1.0000 "application " | TOPICAL_CREAM | CUTANEOUS | 0 refills | Status: DC | PRN

## 2021-06-10 MED ORDER — DIPHENHYDRAMINE HCL 25 MG PO CAPS
50.0000 mg | ORAL_CAPSULE | Freq: Once | ORAL | Status: AC
  Administered 2021-06-10: 50 mg via ORAL
  Filled 2021-06-10: qty 2

## 2021-06-10 MED ORDER — SODIUM CHLORIDE 0.9 % IV SOLN
Freq: Once | INTRAVENOUS | Status: AC
  Filled 2021-06-10: qty 250

## 2021-06-10 MED ORDER — ONDANSETRON HCL 8 MG PO TABS: 8.0000 mg | ORAL_TABLET | Freq: Three times a day (TID) | ORAL | 0 refills | Status: DC | PRN

## 2021-06-10 MED ORDER — TRASTUZUMAB-DKST CHEMO 150 MG IV SOLR
300.0000 mg | Freq: Once | INTRAVENOUS | Status: AC
  Administered 2021-06-10: 300 mg via INTRAVENOUS
  Filled 2021-06-10: qty 14.29

## 2021-06-10 MED ORDER — SODIUM CHLORIDE 0.9 % IV SOLN
420.0000 mg | Freq: Once | INTRAVENOUS | Status: AC
  Administered 2021-06-10: 420 mg via INTRAVENOUS
  Filled 2021-06-10: qty 14

## 2021-06-10 NOTE — Progress Notes (Signed)
I connected with Nadya Mimbs on 06/10/21 at  9:45 AM EDT by video enabled telemedicine visit and verified that I am speaking with the correct person using two identifiers.   I discussed the limitations, risks, security and privacy concerns of performing an evaluation and management service by telemedicine and the availability of in-person appointments. I also discussed with the patient that there may be a patient responsible charge related to this service. The patient expressed understanding and agreed to proceed.  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  She then noticed that her lump is gradually getting larger with an area of ulceration on the skin and underwent a diagnostic bilateral mammogram on 09/08/2020 which showed hypoechoic interconnected masses in the left breast from 10:00 to 2 o'clock position spanning at least an area of 4.8 cm.  Ultrasound also showed 5 abnormal lymph nodes in the axilla with cortical thickening.  Both the mass and the lymph nodes were biopsied and was consistent with invasive mammary carcinoma grade 2.  Tumor was ER +91- 100%, PR +11 to 20% and HER-2 negative.  Ki-67 15%. Patient will be meeting [name] from [facility] surgery to discuss surgical management.  She is here for medical oncology recommendations.  In terms of her general health patient is otherwise doing well and does not have  significant comorbidities.  She does endorse significant pain in the area of her left breast.  Tylenol and Motrin has not been helping her with this pain.  She lives with her husband and 2 children who have special needs.  No prior history of abnormal breast mammograms or breast biopsies.   PET CT scan showed hypermetabolism in the area of the left breast but no evidence of hypermetabolism in the left axilla Or evidence of distant metastatic disease.   Neoadjuvant dose dense ACT chemotherapy started on 10/08/2020. Interim ultrasound after 4 cycles of dose dense AC chemotherapy showed overall decrease in volume of the tumor.  Nodularity previously seen on ultrasound was also decreased in size.   Patient completed neoadjuvant AC Taxol chemotherapy and underwent bilateral mastectomy with reconstruction.Final pathology showed fibrocystic changes in the right breast with no malignancy.  3.6 cm invasive mammary carcinoma in the left breast.  7 out of 16 lymph nodes positive for malignancy.  Treatment effect in the breast present but not robust.  Treatment effect in the lymph nodes minimal.  Margins negative.  Overall grade 2.  Extranodal extension present.  ER greater than 90% positive, PR 15% positive.  HER2 +2 equivocal and positive by FISH  Interval history neuropathy is better with lyric but patient reports ongoing nausea which has worsened after starting lyrica. She wants a refill of zofran   Review of Systems  Constitutional:  Positive for malaise/fatigue. Negative for chills, fever and weight loss.  HENT:  Negative for congestion, ear discharge and nosebleeds.   Eyes:  Negative for blurred vision.  Respiratory:  Negative for cough, hemoptysis, sputum production, shortness of breath and wheezing.   Cardiovascular:  Negative for chest pain, palpitations, orthopnea and claudication.  Gastrointestinal:  Positive for nausea. Negative for abdominal pain, blood in stool, constipation, diarrhea, heartburn,  melena and vomiting.  Genitourinary:  Negative for dysuria, flank pain, frequency, hematuria and urgency.  Musculoskeletal:  Negative for back pain, joint pain and myalgias.  Skin:  Negative for rash.  Neurological:  Positive for sensory change (peripheral neuropathy). Negative for dizziness, tingling, focal weakness, seizures, weakness and headaches.  Endo/Heme/Allergies:  Does not bruise/bleed easily.  Psychiatric/Behavioral:  Negative for depression and suicidal ideas. The patient does not have insomnia.    Allergies  Allergen Reactions   Morphine Nausea And Vomiting and Other (See Comments)    migranes Other reaction(s): Headache Migraine, vomiting   Sertraline Hcl     REACTION: Worsened symptoms of IBS   Sulfa Antibiotics Rash   Sulfamethoxazole Rash   Sulfonamide Derivatives Rash    Past Medical History:  Diagnosis Date   Anxiety    Breast cancer ([place])    Diverticulitis    Family history of ovarian cancer    GERD (gastroesophageal reflux disease)    IBS (irritable bowel syndrome)    PONV (postoperative nausea and vomiting)    severe migraine and vomiting post anesthesia   Scoliosis     Past Surgical History:  Procedure Laterality Date   ABDOMINAL HYSTERECTOMY     still has ovaries, no gyn cancer, hysterectomy due to endometriosis. NO cervix on exam 01/10/21   BREAST BIOPSY Left 09/15/2020   [place] bx of mass, path pending, Q marker   BREAST BIOPSY Left 09/15/2020   [place] bx of LN, hydromarker, path pending   BREAST RECONSTRUCTION WITH PLACEMENT OF TISSUE EXPANDER AND FLEX HD (ACELLULAR HYDRATED DERMIS) Bilateral 03/17/2021   Procedure: IMMEDIATE BILATERAL BREAST RECONSTRUCTION WITH PLACEMENT OF TISSUE EXPANDER AND FLEX HD (ACELLULAR HYDRATED DERMIS);  Surgeon: Wallace Going, DO;  Location: [place];  Service: Plastics;  Laterality: Bilateral;   IR IMAGING GUIDED PORT INSERTION  10/01/2020   MODIFIED MASTECTOMY Left 03/17/2021   Procedure: LEFT  MODIFIED RADICAL MASTECTOMY;  Surgeon: Erroll Luna, MD;  Location: [place];  Service: General;  Laterality: Left;   PORTA CATH REMOVAL Right 03/17/2021   Procedure: PORTA CATH REMOVAL;  Surgeon: Erroll Luna, MD;  Location: [place];  Service: General;  Laterality: Right;   REMOVAL OF BILATERAL TISSUE EXPANDERS WITH PLACEMENT OF BILATERAL BREAST IMPLANTS Bilateral 05/09/2021   Procedure: REMOVAL OF BILATERAL TISSUE EXPANDERS WITH PLACEMENT OF BILATERAL BREAST IMPLANTS;  Surgeon: Wallace Going, DO;  Location: [place];  Service: Plastics;  Laterality: Bilateral;  90 min   TOTAL MASTECTOMY Right 03/17/2021   Procedure: TOTAL MASTECTOMY;  Surgeon: Erroll Luna, MD;  Location: [place];  Service: General;  Laterality: Right;    Social History   Socioeconomic History   Marital status: Divorced    Spouse name: Not on file   Number of children: 2   Years of education: Not on file   Highest education level: Not on file  Occupational History   Occupation: Nurse    Employer: [employer]  Tobacco Use   Smoking status: Every Day    Packs/day: 0.25    Types: Cigarettes   Smokeless tobacco: Never   Tobacco comments:    Pt trying to quit now. Has reduced amount significantly  Vaping Use  Vaping Use: Never used  Substance and Sexual Activity   Alcohol use: Not Currently   Drug use: No   Sexual activity: Not Currently    Birth control/protection: None  Other Topics Concern   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 She and her spouse are primary caregivers.    Social Determinants of Health   Financial Resource Strain: Not on file  Food Insecurity: Not on file  Transportation Needs: Not on file  Physical Activity: Not on file  Stress: Not on file  Social Connections: Not on file  Intimate Partner Violence: Not on file     Family History  Problem Relation Age of Onset   Hypertension Mother    Osteoarthritis Mother    Diverticulitis Mother    Heart failure Father    Hypertension Father    Gout Father    Diverticulitis Brother    Ovarian cancer Paternal Grandmother      Current Outpatient Medications:    amoxicillin-clavulanate (AUGMENTIN) 875-125 MG tablet, Take 1 tablet by mouth 2 (two) times daily., Disp: 14 tablet, Rfl: 0   DULoxetine (CYMBALTA) 60 MG capsule, Take 1 capsule (60 mg total) by mouth daily., Disp: 90 capsule, Rfl: 3   Lactobacillus (PROBIOTIC ACIDOPHILUS PO), Take 1 capsule by mouth daily., Disp: , Rfl:    lidocaine-prilocaine (EMLA) cream, Apply 1 application topically as needed., Disp: 30 g, Rfl: 0   LORazepam (ATIVAN) 0.5 MG tablet, Take 1 tablet (0.5 mg total) by mouth at bedtime as needed for up to 30 doses for anxiety., Disp: 30 tablet, Rfl: 0   Multiple Vitamins-Minerals (MULTIVITAL PO), Take 1 Dose by mouth daily., Disp: , Rfl:    nicotine (NICODERM CQ - DOSED IN MG/24 HOURS) 21 mg/24hr patch, use 1 patch daily, Disp: 28 patch, Rfl: 0   nicotine (NICOTROL) 10 MG inhaler, FOLLOW INSTRUCTIONS ON PACKAGE AS NEEDED FOR SMOKING CESSATION, Disp: 168 each, Rfl: 0   nicotine polacrilex (SM NICOTINE POLACRILEX) 2 MG lozenge, Take by mouth., Disp: 72 lozenge, Rfl: 0   ondansetron (ZOFRAN) 8 MG tablet, Take 1 tablet (8 mg total) by mouth every 8 (eight) hours as needed for nausea or vomiting., Disp: 601 tablet, Rfl: 0   Oxycodone HCl 10 MG TABS, Take 1 tablet (10 mg total) by mouth every 6 (six) hours as needed., Disp: 120 tablet, Rfl: 0   pregabalin (LYRICA) 75 MG capsule, Take 1 capsule (75 mg total) by mouth 2 (two) times daily., Disp: 60 capsule, Rfl: 1   traZODone (DESYREL) 50 MG tablet, Take 0.5-1 tablets (25-50 mg total) by mouth at bedtime as needed for sleep. (Patient not taking: No sig reported), Disp: 30 tablet, Rfl: 3 No current facility-administered medications for this  visit.  Facility-Administered Medications Ordered in Other Visits:    goserelin (ZOLADEX) injection 3.6 mg, 3.6 mg, Subcutaneous, Q28 days, Sindy Guadeloupe, MD, 3.6 mg at 05/04/21 1150   heparin lock flush 100 unit/mL, 500 Units, Intravenous, Once, Sindy Guadeloupe, MD  No results found.  No images are attached to the encounter.   CMP Latest Ref Rng & Units 04/27/2021  Glucose 70 - 99 mg/dL 114(H)  BUN 6 - 20 mg/dL <5(L)  Creatinine 0.44 - 1.00 mg/dL 0.72  Sodium 135 - 145 mmol/L 137  Potassium 3.5 - 5.1 mmol/L 4.0  Chloride 98 - 111 mmol/L 103  CO2 22 - 32  mmol/L 25  Calcium 8.9 - 10.3 mg/dL 9.4  Total Protein 6.5 - 8.1 g/dL 7.0  Total Bilirubin 0.3 - 1.2 mg/dL 0.5  Alkaline Phos 38 - 126 U/L 92  AST 15 - 41 U/L 25  ALT 0 - 44 U/L 17   CBC Latest Ref Rng & Units 04/27/2021  WBC 4.0 - 10.5 K/uL 9.0  Hemoglobin 12.0 - 15.0 g/dL 14.7  Hematocrit 36.0 - 46.0 % 44.7  Platelets 150 - 400 K/uL 254     Observation/objective:appears in no acute distress over video visit today.  Breathing is nonlabored  Assessment and plan:Patient is a 50 y.o. female with known history of locally advanced left breast cancer which was ER/PR positive and HER2 negative on initial biopsy s/p neoadjuvant AC Taxol chemotherapy final pathology showed 3.6 cm residual breast mass with 7 positive lymph nodes with extracapsular extension.  ER/PR positive and HER2 positive.  She is here for on treatment assessment prior to cycle 3 of adjuvant Herceptin and Perjeta  HER2 testing on her lymph node was negative.  She had significant residual disease out of which the primary breast mass was HER2 positive but lymph nodes were HER2 negative.  As per [facility] recommendations my plan is to continue Herceptin and Perjeta presently to complete 1 year of therapy.  Since her port was removed at the time of surgery I will plan to give her Herceptin and Perjeta through [facility] subacute injection q3 weeks for 1 year  She will proceed with  IV Herceptin and Perjeta today.  Directly proceed with P[facility]go injection in 3 weeks and she will also receive Zoladex on that day.  I will see her back in 6 weeks for cycle 4 of [place]  Chemo induced peripheral neuropathy: Continue Cymbalta and Lyrica.  Drug-induced nausea: Possibly secondary to Lyrica.  Will refill as needed Zofran.  I have also refilled Emla cream which she wishes to apply over abdominal wall site prior to Zoladex injections.  Follow-up instructions:as above  I discussed the assessment and treatment plan with the patient. The patient was provided an opportunity to ask questions and all were answered. The patient agreed with the plan and demonstrated an understanding of the instructions.   The patient was advised to call back or seek an in-person evaluation if the symptoms worsen or if the condition fails to improve as anticipated. Visit Diagnosis: 1. Malignant neoplasm of upper-outer quadrant of left breast in female, estrogen receptor positive ([place])   2. Encounter for monoclonal antibody treatment for malignancy   3. Chemotherapy-induced peripheral neuropathy ([place])   4. Drug-induced nausea and vomiting     Dr. Randa Evens, MD, MPH [facility] at [facility] Tel- [phone] 06/10/2021 8:19 PM

## 2021-06-10 NOTE — Patient Instructions (Signed)
[facility]  Discharge Instructions: Thank you for choosing [facility] to provide your oncology and hematology care.  If you have a lab appointment with the [facility], please go directly to the [facility] and check in at the registration area.  Wear comfortable clothing and clothing appropriate for easy access to any Portacath or PICC line.   We strive to give you quality time with your provider. You may need to reschedule your appointment if you arrive late (15 or more minutes).  Arriving late affects you and other patients whose appointments are after yours.  Also, if you miss three or more appointments without notifying the office, you may be dismissed from the clinic at the provider's discretion.      For prescription refill requests, have your pharmacy contact our office and allow 72 hours for refills to be completed.    Today you received the following chemotherapy and/or immunotherapy agents : Herceptin / Perjeta   To help prevent nausea and vomiting after your treatment, we encourage you to take your nausea medication as directed.  BELOW ARE SYMPTOMS THAT SHOULD BE REPORTED IMMEDIATELY: *FEVER GREATER THAN 100.4 F (38 C) OR HIGHER *CHILLS OR SWEATING *NAUSEA AND VOMITING THAT IS NOT CONTROLLED WITH YOUR NAUSEA MEDICATION *UNUSUAL SHORTNESS OF BREATH *UNUSUAL BRUISING OR BLEEDING *URINARY PROBLEMS (pain or burning when urinating, or frequent urination) *BOWEL PROBLEMS (unusual diarrhea, constipation, pain near the anus) TENDERNESS IN MOUTH AND THROAT WITH OR WITHOUT PRESENCE OF ULCERS (sore throat, sores in mouth, or a toothache) UNUSUAL RASH, SWELLING OR PAIN  UNUSUAL VAGINAL DISCHARGE OR ITCHING   Items with * indicate a potential emergency and should be followed up as soon as possible or go to the Emergency Department if any problems should occur.  Please show the CHEMOTHERAPY ALERT CARD or IMMUNOTHERAPY ALERT CARD at  check-in to the Emergency Department and triage nurse.  Should you have questions after your visit or need to cancel or reschedule your appointment, please contact [facility]  [phone] and follow the prompts.  Office hours are 8:00 a.m. to 4:30 p.m. Monday - Friday. Please note that voicemails left after 4:00 p.m. may not be returned until the following business day.  We are closed weekends and major holidays. You have access to a nurse at all times for urgent questions. Please call the main number to the clinic [phone] and follow the prompts.  For any non-urgent questions, you may also contact your provider using MyChart. We now offer e-Visits for anyone 21 and older to request care online for non-urgent symptoms. For details visit [email or web address].   Also download the MyChart app! Go to the app store, search "MyChart", open the app, select [facility], and log in with your MyChart username and password.  Due to Covid, a mask is required upon entering the hospital/clinic. If you do not have a mask, one will be given to you upon arrival. For doctor visits, patients may have 1 support person aged 26 or older with them. For treatment visits, patients cannot have anyone with them due to current Covid guidelines and our immunocompromised population.

## 2021-06-13 ENCOUNTER — Ambulatory Visit: Admission: RE | Admit: 2021-06-13 | Payer: No Typology Code available for payment source | Source: Ambulatory Visit

## 2021-06-13 ENCOUNTER — Ambulatory Visit: Payer: No Typology Code available for payment source

## 2021-06-13 DIAGNOSIS — Z510 Encounter for antineoplastic radiation therapy: Secondary | ICD-10-CM | POA: Diagnosis not present

## 2021-06-14 ENCOUNTER — Ambulatory Visit: Payer: No Typology Code available for payment source

## 2021-06-14 ENCOUNTER — Ambulatory Visit: Payer: No Typology Code available for payment source | Admitting: Family

## 2021-06-14 ENCOUNTER — Ambulatory Visit
Admission: RE | Admit: 2021-06-14 | Discharge: 2021-06-14 | Disposition: A | Discharge status: Home / Self Care | Payer: No Typology Code available for payment source | Source: Ambulatory Visit | Attending: Radiation Oncology | Admitting: Radiation Oncology

## 2021-06-14 DIAGNOSIS — Z510 Encounter for antineoplastic radiation therapy: Secondary | ICD-10-CM | POA: Diagnosis not present

## 2021-06-15 ENCOUNTER — Ambulatory Visit: Payer: No Typology Code available for payment source

## 2021-06-15 ENCOUNTER — Ambulatory Visit
Admission: RE | Admit: 2021-06-15 | Discharge: 2021-06-15 | Disposition: A | Discharge status: Home / Self Care | Payer: No Typology Code available for payment source | Source: Ambulatory Visit | Attending: Radiation Oncology | Admitting: Radiation Oncology

## 2021-06-15 DIAGNOSIS — Z510 Encounter for antineoplastic radiation therapy: Secondary | ICD-10-CM | POA: Diagnosis not present

## 2021-06-16 ENCOUNTER — Ambulatory Visit
Admission: RE | Admit: 2021-06-16 | Discharge: 2021-06-16 | Disposition: A | Discharge status: Home / Self Care | Payer: No Typology Code available for payment source | Source: Ambulatory Visit | Attending: Radiation Oncology | Admitting: Radiation Oncology

## 2021-06-16 ENCOUNTER — Ambulatory Visit: Payer: No Typology Code available for payment source

## 2021-06-16 DIAGNOSIS — Z510 Encounter for antineoplastic radiation therapy: Secondary | ICD-10-CM | POA: Diagnosis not present

## 2021-06-17 ENCOUNTER — Ambulatory Visit: Payer: No Typology Code available for payment source

## 2021-06-17 ENCOUNTER — Ambulatory Visit
Admission: RE | Admit: 2021-06-17 | Discharge: 2021-06-17 | Disposition: A | Discharge status: Home / Self Care | Payer: No Typology Code available for payment source | Source: Ambulatory Visit | Attending: Radiation Oncology | Admitting: Radiation Oncology

## 2021-06-17 DIAGNOSIS — Z510 Encounter for antineoplastic radiation therapy: Secondary | ICD-10-CM | POA: Diagnosis not present

## 2021-06-20 ENCOUNTER — Ambulatory Visit
Admission: RE | Admit: 2021-06-20 | Discharge: 2021-06-20 | Disposition: A | Discharge status: Home / Self Care | Payer: No Typology Code available for payment source | Source: Ambulatory Visit | Attending: Radiation Oncology | Admitting: Radiation Oncology

## 2021-06-20 ENCOUNTER — Ambulatory Visit: Payer: No Typology Code available for payment source

## 2021-06-20 DIAGNOSIS — Z510 Encounter for antineoplastic radiation therapy: Secondary | ICD-10-CM | POA: Diagnosis not present

## 2021-06-21 ENCOUNTER — Ambulatory Visit
Admission: RE | Admit: 2021-06-21 | Discharge: 2021-06-21 | Disposition: A | Discharge status: Home / Self Care | Payer: No Typology Code available for payment source | Source: Ambulatory Visit | Attending: Radiation Oncology | Admitting: Radiation Oncology

## 2021-06-21 ENCOUNTER — Ambulatory Visit: Payer: No Typology Code available for payment source

## 2021-06-21 DIAGNOSIS — Z510 Encounter for antineoplastic radiation therapy: Secondary | ICD-10-CM | POA: Diagnosis not present

## 2021-06-22 ENCOUNTER — Ambulatory Visit: Payer: No Typology Code available for payment source

## 2021-06-22 ENCOUNTER — Ambulatory Visit: Payer: No Typology Code available for payment source | Admitting: Family

## 2021-06-22 ENCOUNTER — Ambulatory Visit
Admission: RE | Admit: 2021-06-22 | Discharge: 2021-06-22 | Disposition: A | Discharge status: Home / Self Care | Payer: No Typology Code available for payment source | Source: Ambulatory Visit | Attending: Radiation Oncology | Admitting: Radiation Oncology

## 2021-06-22 DIAGNOSIS — Z510 Encounter for antineoplastic radiation therapy: Secondary | ICD-10-CM | POA: Diagnosis not present

## 2021-06-23 ENCOUNTER — Ambulatory Visit: Payer: No Typology Code available for payment source

## 2021-06-23 ENCOUNTER — Ambulatory Visit
Admission: RE | Admit: 2021-06-23 | Discharge: 2021-06-23 | Disposition: A | Discharge status: Home / Self Care | Payer: No Typology Code available for payment source | Source: Ambulatory Visit | Attending: Radiation Oncology | Admitting: Radiation Oncology

## 2021-06-23 ENCOUNTER — Other Ambulatory Visit: Payer: Self-pay

## 2021-06-23 DIAGNOSIS — Z5112 Encounter for antineoplastic immunotherapy: Secondary | ICD-10-CM | POA: Diagnosis present

## 2021-06-23 DIAGNOSIS — Z5111 Encounter for antineoplastic chemotherapy: Secondary | ICD-10-CM | POA: Diagnosis not present

## 2021-06-23 DIAGNOSIS — Z79899 Other long term (current) drug therapy: Secondary | ICD-10-CM | POA: Diagnosis not present

## 2021-06-23 DIAGNOSIS — Z510 Encounter for antineoplastic radiation therapy: Secondary | ICD-10-CM | POA: Diagnosis not present

## 2021-06-23 DIAGNOSIS — Z170 Estrogen receptor positive status [ER+]: Secondary | ICD-10-CM | POA: Insufficient documentation

## 2021-06-23 DIAGNOSIS — C50412 Malignant neoplasm of upper-outer quadrant of left female breast: Secondary | ICD-10-CM | POA: Insufficient documentation

## 2021-06-23 MED ORDER — PREGABALIN 75 MG PO CAPS
ORAL_CAPSULE | ORAL | 0 refills | Status: DC
  Filled 2021-06-23: qty 60, 30d supply, fill #0

## 2021-06-24 ENCOUNTER — Ambulatory Visit: Payer: No Typology Code available for payment source

## 2021-06-24 ENCOUNTER — Ambulatory Visit
Admission: RE | Admit: 2021-06-24 | Discharge: 2021-06-24 | Disposition: A | Discharge status: Home / Self Care | Payer: No Typology Code available for payment source | Source: Ambulatory Visit | Attending: Radiation Oncology | Admitting: Radiation Oncology

## 2021-06-24 DIAGNOSIS — Z5111 Encounter for antineoplastic chemotherapy: Secondary | ICD-10-CM | POA: Diagnosis not present

## 2021-06-27 ENCOUNTER — Encounter: Payer: Self-pay | Admitting: Oncology

## 2021-06-27 ENCOUNTER — Other Ambulatory Visit: Payer: Self-pay | Admitting: Oncology

## 2021-06-28 ENCOUNTER — Ambulatory Visit: Payer: No Typology Code available for payment source

## 2021-06-28 ENCOUNTER — Ambulatory Visit
Admission: RE | Admit: 2021-06-28 | Discharge: 2021-06-28 | Disposition: A | Discharge status: Home / Self Care | Payer: No Typology Code available for payment source | Source: Ambulatory Visit | Attending: Radiation Oncology | Admitting: Radiation Oncology

## 2021-06-28 ENCOUNTER — Encounter: Payer: Self-pay | Admitting: Oncology

## 2021-06-28 DIAGNOSIS — Z5111 Encounter for antineoplastic chemotherapy: Secondary | ICD-10-CM | POA: Diagnosis not present

## 2021-06-28 MED ORDER — OXYCODONE HCL 10 MG PO TABS: 10.0000 mg | ORAL_TABLET | Freq: Four times a day (QID) | ORAL | 0 refills | Status: DC | PRN

## 2021-06-29 ENCOUNTER — Ambulatory Visit: Payer: No Typology Code available for payment source

## 2021-06-29 ENCOUNTER — Ambulatory Visit
Admission: RE | Admit: 2021-06-29 | Discharge: 2021-06-29 | Disposition: A | Discharge status: Home / Self Care | Payer: No Typology Code available for payment source | Source: Ambulatory Visit | Attending: Radiation Oncology | Admitting: Radiation Oncology

## 2021-06-29 DIAGNOSIS — Z5111 Encounter for antineoplastic chemotherapy: Secondary | ICD-10-CM | POA: Diagnosis not present

## 2021-06-30 ENCOUNTER — Ambulatory Visit
Admission: RE | Admit: 2021-06-30 | Discharge: 2021-06-30 | Disposition: A | Discharge status: Home / Self Care | Payer: No Typology Code available for payment source | Source: Ambulatory Visit | Attending: Radiation Oncology | Admitting: Radiation Oncology

## 2021-06-30 ENCOUNTER — Ambulatory Visit: Payer: No Typology Code available for payment source

## 2021-06-30 DIAGNOSIS — Z5111 Encounter for antineoplastic chemotherapy: Secondary | ICD-10-CM | POA: Diagnosis not present

## 2021-07-01 ENCOUNTER — Inpatient Hospital Stay: Payer: No Typology Code available for payment source

## 2021-07-01 ENCOUNTER — Other Ambulatory Visit: Payer: Self-pay | Admitting: Oncology

## 2021-07-01 ENCOUNTER — Ambulatory Visit
Admission: RE | Admit: 2021-07-01 | Discharge: 2021-07-01 | Disposition: A | Discharge status: Home / Self Care | Payer: No Typology Code available for payment source | Source: Ambulatory Visit | Attending: Radiation Oncology | Admitting: Radiation Oncology

## 2021-07-01 ENCOUNTER — Ambulatory Visit: Payer: No Typology Code available for payment source

## 2021-07-01 ENCOUNTER — Inpatient Hospital Stay: Payer: No Typology Code available for payment source | Attending: Oncology

## 2021-07-01 VITALS — BP 124/87 | HR 67 | Temp 96.0°F | Resp 20 | Wt 114.0 lb

## 2021-07-01 DIAGNOSIS — Z5111 Encounter for antineoplastic chemotherapy: Secondary | ICD-10-CM | POA: Insufficient documentation

## 2021-07-01 DIAGNOSIS — C50412 Malignant neoplasm of upper-outer quadrant of left female breast: Secondary | ICD-10-CM

## 2021-07-01 DIAGNOSIS — Z170 Estrogen receptor positive status [ER+]: Secondary | ICD-10-CM

## 2021-07-01 DIAGNOSIS — Z510 Encounter for antineoplastic radiation therapy: Secondary | ICD-10-CM | POA: Insufficient documentation

## 2021-07-01 DIAGNOSIS — Z79899 Other long term (current) drug therapy: Secondary | ICD-10-CM | POA: Insufficient documentation

## 2021-07-01 DIAGNOSIS — Z5112 Encounter for antineoplastic immunotherapy: Secondary | ICD-10-CM | POA: Insufficient documentation

## 2021-07-01 LAB — COMPREHENSIVE METABOLIC PANEL
ALT: 16 U/L (ref 0–44)
AST: 21 U/L (ref 15–41)
Albumin: 4 g/dL (ref 3.5–5.0)
Alkaline Phosphatase: 96 U/L (ref 38–126)
Anion gap: 8 (ref 5–15)
BUN: 10 mg/dL (ref 6–20)
CO2: 26 mmol/L (ref 22–32)
Calcium: 9.5 mg/dL (ref 8.9–10.3)
Chloride: 103 mmol/L (ref 98–111)
Creatinine, Ser: 0.74 mg/dL (ref 0.44–1.00)
GFR, Estimated: >60 mL/min (ref >60)
Glucose, Bld: 150 mg/dL — ABNORMAL HIGH (ref 70–99)
Potassium: 3.7 mmol/L (ref 3.5–5.1)
Sodium: 137 mmol/L (ref 135–145)
Total Bilirubin: 0.3 mg/dL (ref 0.3–1.2)
Total Protein: 7 g/dL (ref 6.5–8.1)

## 2021-07-01 LAB — CBC WITH DIFFERENTIAL/PLATELET
Abs Immature Granulocytes: 0.02 10*3/uL (ref 0.00–0.07)
Basophils Absolute: 0 10*3/uL (ref 0.0–0.1)
Basophils Relative: 0 %
Eosinophils Absolute: 0.1 10*3/uL (ref 0.0–0.5)
Eosinophils Relative: 1 %
HCT: 42.6 % (ref 36.0–46.0)
Hemoglobin: 14.2 g/dL (ref 12.0–15.0)
Immature Granulocytes: 0 %
Lymphocytes Relative: 31 %
Lymphs Abs: 2.8 10*3/uL (ref 0.7–4.0)
MCH: 29.2 pg (ref 26.0–34.0)
MCHC: 33.3 g/dL (ref 30.0–36.0)
MCV: 87.5 fL (ref 80.0–100.0)
Monocytes Absolute: 0.6 10*3/uL (ref 0.1–1.0)
Monocytes Relative: 6 %
Neutro Abs: 5.5 10*3/uL (ref 1.7–7.7)
Neutrophils Relative %: 62 %
Platelets: 231 10*3/uL (ref 150–400)
RBC: 4.87 MIL/uL (ref 3.87–5.11)
RDW: 15 % (ref 11.5–15.5)
WBC: 9 10*3/uL (ref 4.0–10.5)
nRBC: 0 % (ref 0.0–0.2)

## 2021-07-01 MED ORDER — DIPHENHYDRAMINE HCL 25 MG PO CAPS
50.0000 mg | ORAL_CAPSULE | Freq: Once | ORAL | Status: AC
  Administered 2021-07-01: 50 mg via ORAL
  Filled 2021-07-01: qty 2

## 2021-07-01 MED ORDER — GOSERELIN ACETATE 3.6 MG ~~LOC~~ IMPL
3.6000 mg | DRUG_IMPLANT | SUBCUTANEOUS | Status: DC
  Administered 2021-07-01: 3.6 mg via SUBCUTANEOUS
  Filled 2021-07-01: qty 3.6

## 2021-07-01 MED ORDER — ACETAMINOPHEN 325 MG PO TABS
650.0000 mg | ORAL_TABLET | Freq: Once | ORAL | Status: AC
  Administered 2021-07-01: 650 mg via ORAL
  Filled 2021-07-01: qty 2

## 2021-07-01 MED ORDER — PERTUZ-TRASTUZ-HYALURON-ZZXF 60-60-2000 MG-MG-U/ML CHEMO ~~LOC~~ SOLN
10.0000 mL | Freq: Once | SUBCUTANEOUS | Status: AC
  Administered 2021-07-01: 10 mL via SUBCUTANEOUS
  Filled 2021-07-01: qty 10

## 2021-07-01 NOTE — Patient Instructions (Signed)
[facility]  Discharge Instructions: Thank you for choosing [facility] to provide your oncology and hematology care.  If you have a lab appointment with the [facility], please go directly to the [facility] and check in at the registration area.  Wear comfortable clothing and clothing appropriate for easy access to any Portacath or PICC line.   We strive to give you quality time with your provider. You may need to reschedule your appointment if you arrive late (15 or more minutes).  Arriving late affects you and other patients whose appointments are after yours.  Also, if you miss three or more appointments without notifying the office, you may be dismissed from the clinic at the provider's discretion.      For prescription refill requests, have your pharmacy contact our office and allow 72 hours for refills to be completed.    Today you received the following chemotherapy and/or immunotherapy agents: PHESGO      To help prevent nausea and vomiting after your treatment, we encourage you to take your nausea medication as directed.  BELOW ARE SYMPTOMS THAT SHOULD BE REPORTED IMMEDIATELY: *FEVER GREATER THAN 100.4 F (38 C) OR HIGHER *CHILLS OR SWEATING *NAUSEA AND VOMITING THAT IS NOT CONTROLLED WITH YOUR NAUSEA MEDICATION *UNUSUAL SHORTNESS OF BREATH *UNUSUAL BRUISING OR BLEEDING *URINARY PROBLEMS (pain or burning when urinating, or frequent urination) *BOWEL PROBLEMS (unusual diarrhea, constipation, pain near the anus) TENDERNESS IN MOUTH AND THROAT WITH OR WITHOUT PRESENCE OF ULCERS (sore throat, sores in mouth, or a toothache) UNUSUAL RASH, SWELLING OR PAIN  UNUSUAL VAGINAL DISCHARGE OR ITCHING   Items with * indicate a potential emergency and should be followed up as soon as possible or go to the Emergency Department if any problems should occur.  Please show the CHEMOTHERAPY ALERT CARD or IMMUNOTHERAPY ALERT CARD at check-in to  the Emergency Department and triage nurse.  Should you have questions after your visit or need to cancel or reschedule your appointment, please contact [facility]  [phone] and follow the prompts.  Office hours are 8:00 a.m. to 4:30 p.m. Monday - Friday. Please note that voicemails left after 4:00 p.m. may not be returned until the following business day.  We are closed weekends and major holidays. You have access to a nurse at all times for urgent questions. Please call the main number to the clinic [phone] and follow the prompts.  For any non-urgent questions, you may also contact your provider using MyChart. We now offer e-Visits for anyone 49 and older to request care online for non-urgent symptoms. For details visit [email or web address].   Also download the MyChart app! Go to the app store, search "MyChart", open the app, select [facility], and log in with your MyChart username and password.  Due to Covid, a mask is required upon entering the hospital/clinic. If you do not have a mask, one will be given to you upon arrival. For doctor visits, patients may have 1 support person aged 10 or older with them. For treatment visits, patients cannot have anyone with them due to current Covid guidelines and our immunocompromised population. Goserelin injection What is this medication? GOSERELIN (GOE se rel in) is similar to a hormone found in the body. It lowers the amount of sex hormones that the body makes. Men will have lower testosterone levels and women will have lower estrogen levels while taking this medicine. In men, this medicine is used to treat prostate  cancer; the injection is either given once per month or once every 12 weeks. A once per month injection (only) is used to treat women with endometriosis, dysfunctional uterine bleeding, or advanced breast cancer. This medicine may be used for other purposes; ask your health care provider or  pharmacist if you have questions. COMMON BRAND NAME(S): Zoladex What should I tell my care team before I take this medication? They need to know if you have any of these conditions: bone problems diabetes heart disease history of irregular heartbeat an unusual or allergic reaction to goserelin, other medicines, foods, dyes, or preservatives pregnant or trying to get pregnant breast-feeding How should I use this medication? This medicine is for injection under the skin. It is given by a health care professional in a hospital or clinic setting. Talk to your pediatrician regarding the use of this medicine in children. Special care may be needed. Overdosage: If you think you have taken too much of this medicine contact a poison control center or emergency room at once. NOTE: This medicine is only for you. Do not share this medicine with others. What if I miss a dose? It is important not to miss your dose. Call your doctor or health care professional if you are unable to keep an appointment. What may interact with this medication? Do not take this medicine with any of the following medications: cisapride dronedarone pimozide thioridazine This medicine may also interact with the following medications: other medicines that prolong the QT interval (an abnormal heart rhythm) This list may not describe all possible interactions. Give your health care provider a list of all the medicines, herbs, non-prescription drugs, or dietary supplements you use. Also tell them if you smoke, drink alcohol, or use illegal drugs. Some items may interact with your medicine. What should I watch for while using this medication? Visit your doctor or health care provider for regular checks on your progress. Your symptoms may appear to get worse during the first weeks of this therapy. Tell your doctor or healthcare provider if your symptoms do not start to get better or if they get worse after this time. Your bones may  get weaker if you take this medicine for a long time. If you smoke or frequently drink alcohol you may increase your risk of bone loss. A family history of osteoporosis, chronic use of drugs for seizures (convulsions), or corticosteroids can also increase your risk of bone loss. Talk to your doctor about how to keep your bones strong. This medicine should stop regular monthly menstruation in women. Tell your doctor if you continue to menstruate. Women should not become pregnant while taking this medicine or for 12 weeks after stopping this medicine. Women should inform their doctor if they wish to become pregnant or think they might be pregnant. There is a potential for serious side effects to an unborn child. Talk to your health care professional or pharmacist for more information. Do not breast-feed an infant while taking this medicine. Men should inform their doctors if they wish to father a child. This medicine may lower sperm counts. Talk to your health care professional or pharmacist for more information. This medicine may increase blood sugar. Ask your healthcare provider if changes in diet or medicines are needed if you have diabetes. What side effects may I notice from receiving this medication? Side effects that you should report to your doctor or health care professional as soon as possible: allergic reactions like skin rash, itching or hives, swelling of  the face, lips, or tongue bone pain breathing problems changes in vision chest pain feeling faint or lightheaded, falls fever, chills pain, swelling, warmth in the leg pain, tingling, numbness in the hands or feet signs and symptoms of high blood sugar such as being more thirsty or hungry or having to urinate more than normal. You may also feel very tired or have blurry vision signs and symptoms of low blood pressure like dizziness; feeling faint or lightheaded, falls; unusually weak or tired stomach pain swelling of the ankles, feet,  hands trouble passing urine or change in the amount of urine unusually high or low blood pressure unusually weak or tired Side effects that usually do not require medical attention (report to your doctor or health care professional if they continue or are bothersome): change in sex drive or performance changes in breast size in both males and females changes in emotions or moods headache hot flashes irritation at site where injected loss of appetite skin problems like acne, dry skin vaginal dryness This list may not describe all possible side effects. Call your doctor for medical advice about side effects. You may report side effects to FDA at 1-800-FDA-1088. Where should I keep my medication? This drug is given in a hospital or clinic and will not be stored at home. NOTE: This sheet is a summary. It may not cover all possible information. If you have questions about this medicine, talk to your doctor, pharmacist, or health care provider.  2022 Elsevier/Gold Standard (2019-01-27 14:05:56) Pertuzumab; Trastuzumab; Hyaluronidase Injection What is this medication? PERTUZUMAB (per TOOZ ue mab); TRASTUZUMAB (tras TOO zoo mab); HYALURONIDASE (hye al ur ON i dase) is used to treat breast cancer. Pertuzumab and trastuzumab are a monoclonal antibodies. Hyaluronidase is used to improve the effects of pertuzumab and trastuzumab. This medicine may be used for other purposes; ask your health care provider or pharmacist if you have questions. COMMON BRAND NAME(S): PHESGO What should I tell my care team before I take this medication? They need to know if you have any of these conditions: heart disease high blood pressure history of irregular heartbeat lung or breathing disease, like asthma an unusual or allergic reaction to pertuzumab, trastuzumab, hyaluronidase, other medicines, foods, dyes, or preservatives pregnant or trying to get pregnant breast-feeding How should I use this medication? This  medicine is for injection under the skin. It may be given by a health care professional in a hospital or clinic setting or a health care professional may give you this medicine at home. Talk to your pediatrician regarding the use of this medicine in children. Special care may be needed. Overdosage: If you think you have taken too much of this medicine contact a poison control center or emergency room at once. NOTE: This medicine is only for you. Do not share this medicine with others. What if I miss a dose? It is important not to miss your dose. Call your doctor or health care professional if you are unable to keep an appointment. What may interact with this medication? This medicine may interact with the following medications: certain types of chemotherapy, such as daunorubicin, doxorubicin, epirubicin, and idarubicin This list may not describe all possible interactions. Give your health care provider a list of all the medicines, herbs, non-prescription drugs, or dietary supplements you use. Also tell them if you smoke, drink alcohol, or use illegal drugs. Some items may interact with your medicine. What should I watch for while using this medication? Visit your health care professional  for regular checks on your progress. Tell your health care professional if your symptoms do not start to get better or if they get worse. Your condition will be monitored carefully while you are receiving this medicine. Do not become pregnant while taking this medicine or for 7 months after stopping it. Women should inform their doctor if they wish to become pregnant or think they might be pregnant. There is a potential for serious side effects to an unborn child. Talk to your health care professional or pharmacist for more information. What side effects may I notice from receiving this medication? Side effects that you should report to your doctor or health care professional as soon as possible: allergic reactions  like skin rash, itching or hives, swelling of the face, lips, or tongue breathing problems chest pain cough dizziness feeling faint; lightheaded, falls fever or chills loss of consciousness pain, tingling, numbness in the hands or feet palpitations sudden weight gain swelling of the ankles or legs unusually weak or tired Side effects that usually do not require medical attention (report these to your doctor or health care professional if they continue or are bothersome): diarrhea hair loss joint pain muscle pain nausea, vomiting pain, redness, or irritation at site where injected tiredness This list may not describe all possible side effects. Call your doctor for medical advice about side effects. You may report side effects to FDA at 1-800-FDA-1088. Where should I keep my medication? Keep out of the reach of children. If you are using this medicine at home, you will be instructed on how to store it. Throw away any unused medicine after the expiration date on the label. NOTE: This sheet is a summary. It may not cover all possible information. If you have questions about this medicine, talk to your doctor, pharmacist, or health care provider.  2022 Elsevier/Gold Standard (2019-12-16 12:39:07)

## 2021-07-04 ENCOUNTER — Ambulatory Visit: Payer: No Typology Code available for payment source

## 2021-07-04 ENCOUNTER — Ambulatory Visit
Admission: RE | Admit: 2021-07-04 | Discharge: 2021-07-04 | Disposition: A | Discharge status: Home / Self Care | Payer: No Typology Code available for payment source | Source: Ambulatory Visit | Attending: Radiation Oncology | Admitting: Radiation Oncology

## 2021-07-04 ENCOUNTER — Ambulatory Visit: Payer: No Typology Code available for payment source | Attending: Surgery

## 2021-07-04 ENCOUNTER — Other Ambulatory Visit: Payer: Self-pay

## 2021-07-04 VITALS — Wt 113.1 lb

## 2021-07-04 DIAGNOSIS — Z483 Aftercare following surgery for neoplasm: Secondary | ICD-10-CM | POA: Insufficient documentation

## 2021-07-04 DIAGNOSIS — Z5111 Encounter for antineoplastic chemotherapy: Secondary | ICD-10-CM | POA: Diagnosis not present

## 2021-07-04 NOTE — Therapy (Signed)
[facility], [place], [address] Phone: [phone]   Fax:  [phone]  Physical Therapy Treatment  Patient Details  Name: Tricia Berry MRN: [id number] Date of Birth: May 26, 1971 Referring Provider (PT): [facility]   Encounter Date: 07/04/2021   PT End of Session - 07/04/21 1110     Visit Number 7   # unchanged due to screen only   Number of Visits 8    PT Start Time 1102    PT Stop Time 1111    PT Time Calculation (min) 9 min    Activity Tolerance Patient tolerated treatment well    Behavior During Therapy [facility] for tasks assessed/performed             Past Medical History:  Diagnosis Date   Anxiety    Breast cancer ([place])    Diverticulitis    Family history of ovarian cancer    GERD (gastroesophageal reflux disease)    IBS (irritable bowel syndrome)    PONV (postoperative nausea and vomiting)    severe migraine and vomiting post anesthesia   Scoliosis     Past Surgical History:  Procedure Laterality Date   ABDOMINAL HYSTERECTOMY     still has ovaries, no gyn cancer, hysterectomy due to endometriosis. NO cervix on exam 01/10/21   BREAST BIOPSY Left 09/15/2020   [place] bx of mass, path pending, Q marker   BREAST BIOPSY Left 09/15/2020   [place] bx of LN, hydromarker, path pending   BREAST RECONSTRUCTION WITH PLACEMENT OF TISSUE EXPANDER AND FLEX HD (ACELLULAR HYDRATED DERMIS) Bilateral 03/17/2021   Procedure: IMMEDIATE BILATERAL BREAST RECONSTRUCTION WITH PLACEMENT OF TISSUE EXPANDER AND FLEX HD (ACELLULAR HYDRATED DERMIS);  Surgeon: Wallace Going, DO;  Location: [facility];  Service: Plastics;  Laterality: Bilateral;   IR IMAGING GUIDED PORT INSERTION  10/01/2020   MODIFIED MASTECTOMY Left 03/17/2021   Procedure: LEFT MODIFIED RADICAL MASTECTOMY;  Surgeon: Erroll Luna, MD;  Location: [facility];  Service: General;  Laterality: Left;   PORTA CATH REMOVAL  Right 03/17/2021   Procedure: PORTA CATH REMOVAL;  Surgeon: Erroll Luna, MD;  Location: [facility];  Service: General;  Laterality: Right;   REMOVAL OF BILATERAL TISSUE EXPANDERS WITH PLACEMENT OF BILATERAL BREAST IMPLANTS Bilateral 05/09/2021   Procedure: REMOVAL OF BILATERAL TISSUE EXPANDERS WITH PLACEMENT OF BILATERAL BREAST IMPLANTS;  Surgeon: Wallace Going, DO;  Location: [facility];  Service: Plastics;  Laterality: Bilateral;  90 min   TOTAL MASTECTOMY Right 03/17/2021   Procedure: TOTAL MASTECTOMY;  Surgeon: Erroll Luna, MD;  Location: [facility];  Service: General;  Laterality: Right;    Vitals:   07/04/21 1106  Weight: 113 lb 2 oz (51.3 kg)     Subjective Assessment - 07/04/21 1107     Subjective Pt returns for her 3 month L-Dex screen.    Pertinent History L breast cancer ER+/PR+ HER2-, stage III, will undergo a bilateral mastectomy and ALND on L (7/16) on 03/17/21, neuropathy of hands, lower legs and feet                    L-DEX FLOWSHEETS - 07/04/21 1100       L-DEX LYMPHEDEMA SCREENING   Measurement Type Unilateral    L-DEX MEASUREMENT EXTREMITY Upper Extremity    POSITION  Standing    DOMINANT SIDE Right    At Risk Side Left    BASELINE SCORE (UNILATERAL) 0.6  L-DEX SCORE (UNILATERAL) -0.9    VALUE CHANGE (UNILAT) -1.5                                     PT Long Term Goals - 05/31/21 1011       PT LONG TERM GOAL #1   Title Pt will return to baseline shoulder ROM measurements and not demonstrate any signs or symptoms of lymphedema.    Baseline 05/31/21- pt has 180 degrees of flex and abd bilaterally    Time 4    Period Weeks    Status Achieved      PT LONG TERM GOAL #2   Title Pt will demonstrate 165 degrees of bilateral flexion to allow pt to reach overhead.    Baseline R 143 L 101; 05/02/21- 172 on R, 175 on L    Time 4    Period Weeks    Status Achieved       PT LONG TERM GOAL #3   Title Pt will demonstrate 165 degrees of bilateral shoulder abduction to allow her to reach out the side    Baseline R 152 L 78; 05/02/21- R 180 L 179    Time 4    Period Weeks    Status Achieved      PT LONG TERM GOAL #4   Title Pt will report she is no longer having muscle spasms across her chest to allow improved comfort    Baseline 05/02/21- pt reports she is no longer having muscle spasms    Time 4    Period Weeks    Status Achieved      PT LONG TERM GOAL #5   Title Pt will be independent in a home exercise program for long term strengthening and stretching.    Time 4    Period Weeks    Status Achieved      Additional Long Term Goals   Additional Long Term Goals Yes      PT LONG TERM GOAL #6   Title Pt will report a 50% improvement in edema in L trunk and chest to allow improved comfort.    Baseline 05/02/21- 20% improved; 05/31/21 no edema present    Time 4    Period Weeks    Status Achieved      PT LONG TERM GOAL #7   Title Pt will be independent in Strength ABC program for long term stretching and strengthening after completion of radiation.    Time 10    Period Weeks    Status New    Target Date 08/09/21                   Plan - 07/04/21 1115     Clinical Impression Statement Pt returns for her 3 month L-Dex screen. Her change from baseline of -1.5 is WNLs so no furhter treatment is required at this time except to contevery 3 month L-Dex screens which pt is agreeable to.    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[facility]) 03/14/2021   Genetic testing 11/22/2020   Family history of ovarian cancer    Candidal vulvovaginitis 11/01/2020   Low back pain 10/06/2020   Encounter for medical examination to establish care 09/26/2020   Malignant neoplasm of upper-outer quadrant of left breast in female, estrogen receptor positive ([facility]) 09/26/2020   BACK STRAIN, LUMBAR 03/08/2010   INTERNAL HEMORRHOIDS 01/27/2010   ANAL FISSURE 01/27/2010   OSTEOARTHRITIS, HANDS, BILATERAL 01/04/2010   ARTHRALGIA 10/05/2009   INSOMNIA, CHRONIC 05/06/2009   ALLERGIC RHINITIS, SEASONAL 02/09/2009   TOBACCO ABUSE 12/29/2008   IBS 10/13/2008   Anxiety state 08/25/2008   DEPRESSION, RECURRENT 08/25/2008    Otelia Limes, PTA 07/04/2021, 12:38 PM  [place], [place], [address] Phone: [phone]   Fax:  [phone]  Name: Tricia Berry MRN: [id number] Date of Birth: [birth year]

## 2021-07-05 ENCOUNTER — Ambulatory Visit: Payer: No Typology Code available for payment source

## 2021-07-05 ENCOUNTER — Ambulatory Visit
Admission: RE | Admit: 2021-07-05 | Discharge: 2021-07-05 | Disposition: A | Discharge status: Home / Self Care | Payer: No Typology Code available for payment source | Source: Ambulatory Visit | Attending: Radiation Oncology | Admitting: Radiation Oncology

## 2021-07-05 DIAGNOSIS — Z5111 Encounter for antineoplastic chemotherapy: Secondary | ICD-10-CM | POA: Diagnosis not present

## 2021-07-06 ENCOUNTER — Ambulatory Visit
Admission: RE | Admit: 2021-07-06 | Discharge: 2021-07-06 | Disposition: A | Discharge status: Home / Self Care | Payer: No Typology Code available for payment source | Source: Ambulatory Visit | Attending: Radiation Oncology | Admitting: Radiation Oncology

## 2021-07-06 ENCOUNTER — Ambulatory Visit: Payer: No Typology Code available for payment source

## 2021-07-06 DIAGNOSIS — Z5111 Encounter for antineoplastic chemotherapy: Secondary | ICD-10-CM | POA: Diagnosis not present

## 2021-07-07 ENCOUNTER — Ambulatory Visit: Payer: No Typology Code available for payment source

## 2021-07-07 ENCOUNTER — Ambulatory Visit
Admission: RE | Admit: 2021-07-07 | Discharge: 2021-07-07 | Disposition: A | Discharge status: Home / Self Care | Payer: No Typology Code available for payment source | Source: Ambulatory Visit | Attending: Radiation Oncology | Admitting: Radiation Oncology

## 2021-07-07 DIAGNOSIS — Z5111 Encounter for antineoplastic chemotherapy: Secondary | ICD-10-CM | POA: Diagnosis not present

## 2021-07-08 ENCOUNTER — Ambulatory Visit
Admission: RE | Admit: 2021-07-08 | Discharge: 2021-07-08 | Disposition: A | Discharge status: Home / Self Care | Payer: No Typology Code available for payment source | Source: Ambulatory Visit | Attending: Radiation Oncology | Admitting: Radiation Oncology

## 2021-07-08 ENCOUNTER — Ambulatory Visit: Payer: No Typology Code available for payment source

## 2021-07-08 DIAGNOSIS — Z5111 Encounter for antineoplastic chemotherapy: Secondary | ICD-10-CM | POA: Diagnosis not present

## 2021-07-11 ENCOUNTER — Ambulatory Visit: Payer: No Typology Code available for payment source

## 2021-07-11 ENCOUNTER — Ambulatory Visit
Admission: RE | Admit: 2021-07-11 | Discharge: 2021-07-11 | Disposition: A | Discharge status: Home / Self Care | Payer: No Typology Code available for payment source | Source: Ambulatory Visit | Attending: Radiation Oncology | Admitting: Radiation Oncology

## 2021-07-11 DIAGNOSIS — Z5111 Encounter for antineoplastic chemotherapy: Secondary | ICD-10-CM | POA: Diagnosis not present

## 2021-07-12 ENCOUNTER — Ambulatory Visit: Payer: No Typology Code available for payment source

## 2021-07-12 ENCOUNTER — Ambulatory Visit
Admission: RE | Admit: 2021-07-12 | Discharge: 2021-07-12 | Disposition: A | Discharge status: Home / Self Care | Payer: No Typology Code available for payment source | Source: Ambulatory Visit | Attending: Radiation Oncology | Admitting: Radiation Oncology

## 2021-07-12 DIAGNOSIS — Z5111 Encounter for antineoplastic chemotherapy: Secondary | ICD-10-CM | POA: Diagnosis not present

## 2021-07-13 ENCOUNTER — Other Ambulatory Visit: Payer: Self-pay | Admitting: Oncology

## 2021-07-13 ENCOUNTER — Ambulatory Visit: Payer: No Typology Code available for payment source

## 2021-07-13 ENCOUNTER — Other Ambulatory Visit: Payer: Self-pay

## 2021-07-13 ENCOUNTER — Other Ambulatory Visit: Payer: Self-pay | Admitting: Pharmacist

## 2021-07-13 ENCOUNTER — Ambulatory Visit
Admission: RE | Admit: 2021-07-13 | Discharge: 2021-07-13 | Disposition: A | Discharge status: Home / Self Care | Payer: No Typology Code available for payment source | Source: Ambulatory Visit | Attending: Radiation Oncology | Admitting: Radiation Oncology

## 2021-07-13 ENCOUNTER — Telehealth: Payer: Self-pay

## 2021-07-13 DIAGNOSIS — Z5111 Encounter for antineoplastic chemotherapy: Secondary | ICD-10-CM | POA: Diagnosis not present

## 2021-07-13 MED ORDER — NICOTINE POLACRILEX 2 MG MT LOZG
LOZENGE | OROMUCOSAL | 0 refills | Status: DC
  Filled 2021-07-13: qty 72, 15d supply, fill #0

## 2021-07-13 MED ORDER — NICOTINE 21 MG/24HR TD PT24
MEDICATED_PATCH | Freq: Every day | TRANSDERMAL | 0 refills | Status: DC
  Filled 2021-07-13: qty 28, 28d supply, fill #0

## 2021-07-13 MED FILL — Nicotine Inhaler System 10 MG (4 MG Delivered): RESPIRATORY_TRACT | 30 days supply | Qty: 168 | Fill #0 | Status: AC

## 2021-07-13 NOTE — Telephone Encounter (Signed)
Patient called to say that her right implant, which is on the side not being radiated, fell a little.  She said that she can lift it up and it moves around.  Patient said that during radiation yesterday, the radiation tech noticed a raised circular area coming off the right breast over her chest bone.  She said that she has had pain in that area on and off since her surgery.  She said that it can only be seen when she is lying down.  Patient said that she wants [place] to be aware of it.  She also said that she is not wearing a bra while she is having her radiation.  Patient said that her last radiation treatment on her left side is on 07/29/2021 and her next appointment with [place] is on 08/16/2021.  Patient would like to know if we think she needs to be seen sooner.  Please call.

## 2021-07-14 ENCOUNTER — Other Ambulatory Visit: Payer: Self-pay

## 2021-07-14 ENCOUNTER — Ambulatory Visit
Admission: RE | Admit: 2021-07-14 | Discharge: 2021-07-14 | Disposition: A | Discharge status: Home / Self Care | Payer: No Typology Code available for payment source | Source: Ambulatory Visit | Attending: Radiation Oncology | Admitting: Radiation Oncology

## 2021-07-14 ENCOUNTER — Ambulatory Visit: Payer: No Typology Code available for payment source

## 2021-07-14 DIAGNOSIS — Z5111 Encounter for antineoplastic chemotherapy: Secondary | ICD-10-CM | POA: Diagnosis not present

## 2021-07-14 NOTE — Telephone Encounter (Signed)
Called and [facility] @ 9:09am) asking the patient to give [place] a call back regarding the message below.//AB/[name]

## 2021-07-14 NOTE — Telephone Encounter (Signed)
Spoke with patient regarding the message below and informed her that I spoke with [facility] regarding her message and he stated that it could be that the muscle is loosening up and the implant is settling.  And for her to keep her appointment.    Patient stated that she thought that could be what was happening.  She verbalized understanding and agreed.//AB/[name]

## 2021-07-15 ENCOUNTER — Ambulatory Visit: Payer: No Typology Code available for payment source

## 2021-07-15 ENCOUNTER — Ambulatory Visit
Admission: RE | Admit: 2021-07-15 | Discharge: 2021-07-15 | Disposition: A | Discharge status: Home / Self Care | Payer: No Typology Code available for payment source | Source: Ambulatory Visit | Attending: Radiation Oncology | Admitting: Radiation Oncology

## 2021-07-15 ENCOUNTER — Other Ambulatory Visit: Payer: Self-pay

## 2021-07-15 DIAGNOSIS — Z5111 Encounter for antineoplastic chemotherapy: Secondary | ICD-10-CM | POA: Diagnosis not present

## 2021-07-18 ENCOUNTER — Ambulatory Visit
Admission: RE | Admit: 2021-07-18 | Discharge: 2021-07-18 | Disposition: A | Discharge status: Home / Self Care | Payer: No Typology Code available for payment source | Source: Ambulatory Visit | Attending: Radiation Oncology | Admitting: Radiation Oncology

## 2021-07-18 ENCOUNTER — Other Ambulatory Visit: Payer: Self-pay

## 2021-07-18 ENCOUNTER — Ambulatory Visit
Admission: RE | Admit: 2021-07-18 | Discharge: 2021-07-18 | Disposition: A | Discharge status: Home / Self Care | Payer: No Typology Code available for payment source | Source: Ambulatory Visit | Attending: Family | Admitting: Family

## 2021-07-18 ENCOUNTER — Encounter: Payer: Self-pay | Admitting: Family

## 2021-07-18 ENCOUNTER — Ambulatory Visit (INDEPENDENT_AMBULATORY_CARE_PROVIDER_SITE_OTHER): Payer: No Typology Code available for payment source | Admitting: Family

## 2021-07-18 VITALS — BP 104/72 | HR 86 | Temp 98.0°F | Ht 60.0 in | Wt 116.2 lb

## 2021-07-18 DIAGNOSIS — T451X5A Adverse effect of antineoplastic and immunosuppressive drugs, initial encounter: Secondary | ICD-10-CM | POA: Diagnosis not present

## 2021-07-18 DIAGNOSIS — R109 Unspecified abdominal pain: Secondary | ICD-10-CM | POA: Insufficient documentation

## 2021-07-18 DIAGNOSIS — R1031 Right lower quadrant pain: Secondary | ICD-10-CM

## 2021-07-18 DIAGNOSIS — Z23 Encounter for immunization: Secondary | ICD-10-CM | POA: Diagnosis not present

## 2021-07-18 DIAGNOSIS — G620 Drug-induced polyneuropathy: Secondary | ICD-10-CM | POA: Diagnosis not present

## 2021-07-18 MED ORDER — IOHEXOL 350 MG/ML SOLN
75.0000 mL | Freq: Once | INTRAVENOUS | Status: AC | PRN
  Administered 2021-07-18: 75 mL via INTRAVENOUS

## 2021-07-18 MED ORDER — CYANOCOBALAMIN 1000 MCG/ML IJ SOLN: INTRAMUSCULAR | 4 refills | Status: DC

## 2021-07-18 NOTE — Patient Instructions (Addendum)
Ct abdomen today Please take stool to [facility] medical ASAP to ensure you do not have bacterial infection  Start b12 injections ONCE per month at home.

## 2021-07-18 NOTE — Progress Notes (Signed)
Subjective:    Patient ID: [name], female    DOB: 1971-09-06, 50 y.o.   MRN: [id number]  CC: STEFANNIE DEFEO is a 50 y.o. female who presents today for follow up.   HPI: Complains of right lower quadrant pain x 4 weeks. Mild pain today.  Comes and goes. More noticeable at night. Not related to meals.   No groin pain, saddle anesthesia.   Urinary urgency started today. Completed augmentin 1 month ago. No fever, chills, vomiting.  Fluctuate with diarrhea and constipation.  Compliant with oxycodone 3-4 times per day. Taking stool softener.   H/o diverticulitis  Chronic nausea with chemotherapy.   Dr Janese Banks refilled  ativan however she hasnt needed  Started lyrica for peripheral neuropathy.  She is no longer on Cymbalta   Trazodone 25mg  prn however she has not needed the trazodone  Due colonoscopy Urine culture 05/20/21 History of hysterectomy Currently undergoing chemo every 3 weeks, radiation Dr Janese Banks, Dr Donella Stade.  Completes radiation next week.   HISTORY:  Past Medical History:  Diagnosis Date   Anxiety    Breast cancer ([place])    Diverticulitis    Family history of ovarian cancer    GERD (gastroesophageal reflux disease)    IBS (irritable bowel syndrome)    PONV (postoperative nausea and vomiting)    severe migraine and vomiting post anesthesia   Scoliosis    Past Surgical History:  Procedure Laterality Date   ABDOMINAL HYSTERECTOMY     still has ovaries, no gyn cancer, hysterectomy due to endometriosis. NO cervix on exam 01/10/21   BREAST BIOPSY Left 09/15/2020   [place] bx of mass, path pending, Q marker   BREAST BIOPSY Left 09/15/2020   [place] bx of LN, hydromarker, path pending   BREAST RECONSTRUCTION WITH PLACEMENT OF TISSUE EXPANDER AND FLEX HD (ACELLULAR HYDRATED DERMIS) Bilateral 03/17/2021   Procedure: IMMEDIATE BILATERAL BREAST RECONSTRUCTION WITH PLACEMENT OF TISSUE EXPANDER AND FLEX HD (ACELLULAR HYDRATED DERMIS);  Surgeon: Wallace Going, DO;   Location: [place];  Service: Plastics;  Laterality: Bilateral;   IR IMAGING GUIDED PORT INSERTION  10/01/2020   MODIFIED MASTECTOMY Left 03/17/2021   Procedure: LEFT MODIFIED RADICAL MASTECTOMY;  Surgeon: Erroll Luna, MD;  Location: [place];  Service: General;  Laterality: Left;   PORTA CATH REMOVAL Right 03/17/2021   Procedure: PORTA CATH REMOVAL;  Surgeon: Erroll Luna, MD;  Location: [place];  Service: General;  Laterality: Right;   REMOVAL OF BILATERAL TISSUE EXPANDERS WITH PLACEMENT OF BILATERAL BREAST IMPLANTS Bilateral 05/09/2021   Procedure: REMOVAL OF BILATERAL TISSUE EXPANDERS WITH PLACEMENT OF BILATERAL BREAST IMPLANTS;  Surgeon: Wallace Going, DO;  Location: [place];  Service: Plastics;  Laterality: Bilateral;  90 min   TOTAL MASTECTOMY Right 03/17/2021   Procedure: TOTAL MASTECTOMY;  Surgeon: Erroll Luna, MD;  Location: [place];  Service: General;  Laterality: Right;   Family History  Problem Relation Age of Onset   Hypertension Mother    Osteoarthritis Mother    Diverticulitis Mother    Heart failure Father    Hypertension Father    Gout Father    Diverticulitis Brother    Ovarian cancer Paternal Grandmother     Allergies: Morphine, Sertraline hcl, Sulfa antibiotics, Sulfamethoxazole, and Sulfonamide derivatives Current Outpatient Medications on File Prior to Visit  Medication Sig Dispense Refill   Lactobacillus (PROBIOTIC ACIDOPHILUS PO) Take 1 capsule by mouth daily.     lidocaine-prilocaine (  EMLA) cream Apply 1 application topically as needed. 30 g 0   LORazepam (ATIVAN) 0.5 MG tablet Take 1 tablet (0.5 mg total) by mouth at bedtime as needed for up to 30 doses for anxiety. 30 tablet 0   Multiple Vitamins-Minerals (MULTIVITAL PO) Take 1 Dose by mouth daily.     nicotine (NICODERM CQ - DOSED IN MG/24 HOURS) 21 mg/24hr patch use 1 patch daily 28 patch 0   nicotine  (NICOTROL) 10 MG inhaler FOLLOW INSTRUCTIONS ON PACKAGE AS NEEDED FOR SMOKING CESSATION 168 each 0   nicotine polacrilex (SM NICOTINE) 2 MG lozenge Take by mouth. 72 lozenge 0   ondansetron (ZOFRAN) 8 MG tablet Take 1 tablet (8 mg total) by mouth every 8 (eight) hours as needed for nausea or vomiting. 601 tablet 0   Oxycodone HCl 10 MG TABS Take 1 tablet (10 mg total) by mouth every 6 (six) hours as needed. 120 tablet 0   pregabalin (LYRICA) 75 MG capsule Take 1 capsule (75 mg total) by mouth 2 (two) times daily. 60 capsule 1   traZODone (DESYREL) 50 MG tablet Take 0.5-1 tablets (25-50 mg total) by mouth at bedtime as needed for sleep. 30 tablet 3   Current Facility-Administered Medications on File Prior to Visit  Medication Dose Route Frequency Provider Last Rate Last Admin   goserelin (ZOLADEX) injection 3.6 mg  3.6 mg Subcutaneous Q28 days Sindy Guadeloupe, MD   3.6 mg at 05/04/21 1150   heparin lock flush 100 unit/mL  500 Units Intravenous Once Sindy Guadeloupe, MD        Social History   Tobacco Use   Smoking status: Every Day    Packs/day: 0.25    Types: Cigarettes   Smokeless tobacco: Never   Tobacco comments:    Pt trying to quit now. Has reduced amount significantly  Vaping Use   Vaping Use: Never used  Substance Use Topics   Alcohol use: Not Currently   Drug use: No    Review of Systems  Constitutional:  Negative for chills and fever.  Respiratory:  Negative for cough.   Cardiovascular:  Negative for chest pain and palpitations.  Gastrointestinal:  Positive for abdominal pain, constipation, diarrhea and nausea. Negative for abdominal distention and vomiting.  Genitourinary:  Positive for frequency. Negative for hematuria.     Objective:    BP 104/72 (BP Location: Right Arm, Patient Position: Sitting, Cuff Size: Normal)   Pulse 86   Temp 98 F (36.7 C) (Oral)   Ht 5' (1.524 m)   Wt 116 lb 3.2 oz (52.7 kg)   SpO2 97%   BMI 22.69 kg/m  BP Readings from Last 3  Encounters:  07/18/21 104/72  07/01/21 124/87  06/10/21 118/84   Wt Readings from Last 3 Encounters:  07/18/21 116 lb 3.2 oz (52.7 kg)  07/04/21 113 lb 2 oz (51.3 kg)  07/01/21 114 lb (51.7 kg)    Physical Exam Vitals reviewed.  Constitutional:      Appearance: Normal appearance. She is well-developed.  Eyes:     Conjunctiva/sclera: Conjunctivae normal.  Cardiovascular:     Rate and Rhythm: Normal rate and regular rhythm.     Pulses: Normal pulses.     Heart sounds: Normal heart sounds.  Pulmonary:     Effort: Pulmonary effort is normal.     Breath sounds: Normal breath sounds. No wheezing, rhonchi or rales.  Abdominal:     General: Bowel sounds are normal. There is no distension.  Palpations: Abdomen is soft. Abdomen is not rigid. There is no fluid wave or mass.     Tenderness: There is abdominal tenderness in the right lower quadrant. There is no guarding or rebound.  Skin:    General: Skin is warm and dry.  Neurological:     Mental Status: She is alert.  Psychiatric:        Speech: Speech normal.        Behavior: Behavior normal.        Thought Content: Thought content normal.       Assessment & Plan:   Problem List Items Addressed This Visit       Nervous and Auditory   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HCC)    Suboptimal control.  She is recently stopped Cymbalta, started Lyrica as prescribed by Dr. Janese Banks.  Pending B12 studies and retrial of B12 injection at home as had conferred benefit.  Advised if labs do not reveal pernicious anemia, she may even take 1000 MCG B12 at home if B12 injection is not approved by insurance.      Relevant Medications   cyanocobalamin (,VITAMIN B-12,) 1000 MCG/ML injection   Other Relevant Orders   Anti-parietal antibody   B12 and Folate Panel   Celiac Disease Ab Screen w/Rfx   Homocysteine   Intrinsic Factor Antibodies   Methylmalonic acid, serum     Other   Abdominal pain - Primary    New. Patient well-appearing today.   Duration abdominal pain for 4 weeks.  Has a history diverticulitis.  Ordered CT abdomen pelvis to evaluate specifically to evaluate diverticulitis.  Pending urine studies to exclude UTI.  Stool study to exclude GI pathogen.  Symptoms likely exacerbated by h/o  IBS, opioid use in the setting of breast cancer.  She is due for colonoscopy however patient would like to wait till she has completed chemotherapy      Relevant Orders   CT ABDOMEN PELVIS W CONTRAST   GI pathogen panel by PCR, stool   Urinalysis, Routine w reflex microscopic   Urine Culture     I have discontinued Marti G. Mates "[name]"'s DULoxetine and amoxicillin-clavulanate. I am also having her start on cyanocobalamin. Additionally, I am having her maintain her Multiple Vitamins-Minerals (MULTIVITAL PO), Lactobacillus (PROBIOTIC ACIDOPHILUS PO), traZODone, pregabalin, ondansetron, LORazepam, lidocaine-prilocaine, Oxycodone HCl, nicotine, nicotine polacrilex, and Nicotrol.   Meds ordered this encounter  Medications   cyanocobalamin (,VITAMIN B-12,) 1000 MCG/ML injection    Sig: 1000 mcg (1 mL) intramuscular injection in the thigh ( vastus lateralis) once per month.    Dispense:  3 mL    Refill:  4    Order Specific Question:   Supervising Provider    Answer:   Crecencio Mc [[id number]]    Return precautions given.   Risks, benefits, and alternatives of the medications and treatment plan prescribed today were discussed, and patient expressed understanding.   Education regarding symptom management and diagnosis given to patient on AVS.  Continue to follow with Burnard Hawthorne, FNP for routine health maintenance.   Alden Benjamin Gossman and I agreed with plan.   Mable Paris, FNP

## 2021-07-18 NOTE — Assessment & Plan Note (Signed)
Suboptimal control.  She is recently stopped Cymbalta, started Lyrica as prescribed by Dr. Janese Banks.  Pending B12 studies and retrial of B12 injection at home as had conferred benefit.  Advised if labs do not reveal pernicious anemia, she may even take 1000 MCG B12 at home if B12 injection is not approved by insurance.

## 2021-07-18 NOTE — Assessment & Plan Note (Addendum)
New. Patient well-appearing today.  Duration abdominal pain for 4 weeks.  Has a history diverticulitis.  Ordered CT abdomen pelvis to evaluate specifically to evaluate diverticulitis.  Pending urine studies to exclude UTI.  Stool study to exclude GI pathogen.  Symptoms likely exacerbated by h/o  IBS, opioid use in the setting of breast cancer.  She is due for colonoscopy however patient would like to wait till she has completed chemotherapy

## 2021-07-19 ENCOUNTER — Ambulatory Visit
Admission: RE | Admit: 2021-07-19 | Discharge: 2021-07-19 | Disposition: A | Discharge status: Home / Self Care | Payer: No Typology Code available for payment source | Source: Ambulatory Visit | Attending: Radiation Oncology | Admitting: Radiation Oncology

## 2021-07-19 DIAGNOSIS — Z5111 Encounter for antineoplastic chemotherapy: Secondary | ICD-10-CM | POA: Diagnosis not present

## 2021-07-19 LAB — URINALYSIS, ROUTINE W REFLEX MICROSCOPIC
Bilirubin Urine: NEGATIVE
Ketones, ur: NEGATIVE
Leukocytes,Ua: NEGATIVE
Nitrite: NEGATIVE
Specific Gravity, Urine: 1.01 (ref 1.000–1.030)
Total Protein, Urine: NEGATIVE
Urine Glucose: NEGATIVE
Urobilinogen, UA: 0.2 (ref 0.0–1.0)
pH: 6 (ref 5.0–8.0)

## 2021-07-19 LAB — URINE CULTURE
MICRO NUMBER:: 12422413
Result:: NO GROWTH
SPECIMEN QUALITY:: ADEQUATE

## 2021-07-19 LAB — B12 AND FOLATE PANEL
Folate: 11.3 ng/mL (ref >5.9)
Vitamin B-12: 203 pg/mL — ABNORMAL LOW (ref 211–911)

## 2021-07-20 ENCOUNTER — Ambulatory Visit
Admission: RE | Admit: 2021-07-20 | Discharge: 2021-07-20 | Disposition: A | Discharge status: Home / Self Care | Payer: No Typology Code available for payment source | Source: Ambulatory Visit | Attending: Radiation Oncology | Admitting: Radiation Oncology

## 2021-07-20 ENCOUNTER — Encounter: Payer: Self-pay | Admitting: Family

## 2021-07-20 DIAGNOSIS — K760 Fatty (change of) liver, not elsewhere classified: Secondary | ICD-10-CM | POA: Insufficient documentation

## 2021-07-20 DIAGNOSIS — Z5111 Encounter for antineoplastic chemotherapy: Secondary | ICD-10-CM | POA: Diagnosis not present

## 2021-07-20 DIAGNOSIS — I700 Atherosclerosis of aorta: Secondary | ICD-10-CM | POA: Insufficient documentation

## 2021-07-20 LAB — CELIAC DISEASE AB SCREEN W/RFX
Antigliadin Abs, IgA: 4 units (ref 0–19)
IgA/Immunoglobulin A, Serum: 154 mg/dL (ref 87–352)
Transglutaminase IgA: <2 U/mL (ref 0–3)

## 2021-07-21 ENCOUNTER — Encounter: Payer: Self-pay | Admitting: Family

## 2021-07-21 ENCOUNTER — Ambulatory Visit
Admission: RE | Admit: 2021-07-21 | Discharge: 2021-07-21 | Disposition: A | Discharge status: Home / Self Care | Payer: No Typology Code available for payment source | Source: Ambulatory Visit | Attending: Radiation Oncology | Admitting: Radiation Oncology

## 2021-07-21 DIAGNOSIS — E538 Deficiency of other specified B group vitamins: Secondary | ICD-10-CM | POA: Insufficient documentation

## 2021-07-21 DIAGNOSIS — Z5111 Encounter for antineoplastic chemotherapy: Secondary | ICD-10-CM | POA: Diagnosis not present

## 2021-07-21 LAB — INTRINSIC FACTOR ANTIBODIES: Intrinsic Factor: NEGATIVE

## 2021-07-21 LAB — HOMOCYSTEINE: Homocysteine: 10.4 umol/L — ABNORMAL HIGH (ref <10.4)

## 2021-07-21 LAB — ANTI-PARIETAL ANTIBODY: PARIETAL CELL AB SCREEN: NEGATIVE

## 2021-07-21 LAB — METHYLMALONIC ACID, SERUM: Methylmalonic Acid, Quant: 173 nmol/L (ref 87–318)

## 2021-07-21 NOTE — Telephone Encounter (Signed)
I tried to call patient on both numbers. Was able to leave message on home phone to call back for results.

## 2021-07-22 ENCOUNTER — Ambulatory Visit
Admission: RE | Admit: 2021-07-22 | Discharge: 2021-07-22 | Disposition: A | Discharge status: Home / Self Care | Payer: No Typology Code available for payment source | Source: Ambulatory Visit | Attending: Radiation Oncology | Admitting: Radiation Oncology

## 2021-07-22 DIAGNOSIS — Z5111 Encounter for antineoplastic chemotherapy: Secondary | ICD-10-CM | POA: Diagnosis not present

## 2021-07-25 ENCOUNTER — Ambulatory Visit: Payer: No Typology Code available for payment source

## 2021-07-25 ENCOUNTER — Inpatient Hospital Stay (HOSPITAL_BASED_OUTPATIENT_CLINIC_OR_DEPARTMENT_OTHER): Payer: No Typology Code available for payment source | Admitting: Oncology

## 2021-07-25 ENCOUNTER — Other Ambulatory Visit: Payer: Self-pay | Admitting: *Deleted

## 2021-07-25 ENCOUNTER — Other Ambulatory Visit: Payer: Self-pay

## 2021-07-25 ENCOUNTER — Ambulatory Visit
Admission: RE | Admit: 2021-07-25 | Discharge: 2021-07-25 | Disposition: A | Discharge status: Home / Self Care | Payer: No Typology Code available for payment source | Source: Ambulatory Visit | Attending: Radiation Oncology | Admitting: Radiation Oncology

## 2021-07-25 ENCOUNTER — Telehealth: Payer: Self-pay | Admitting: Family

## 2021-07-25 ENCOUNTER — Encounter: Payer: Self-pay | Admitting: Oncology

## 2021-07-25 ENCOUNTER — Inpatient Hospital Stay: Payer: No Typology Code available for payment source

## 2021-07-25 VITALS — BP 129/80 | HR 74 | Temp 97.3°F | Resp 16 | Wt 117.5 lb

## 2021-07-25 DIAGNOSIS — T451X5A Adverse effect of antineoplastic and immunosuppressive drugs, initial encounter: Secondary | ICD-10-CM

## 2021-07-25 DIAGNOSIS — Z170 Estrogen receptor positive status [ER+]: Secondary | ICD-10-CM | POA: Insufficient documentation

## 2021-07-25 DIAGNOSIS — G620 Drug-induced polyneuropathy: Secondary | ICD-10-CM

## 2021-07-25 DIAGNOSIS — Z510 Encounter for antineoplastic radiation therapy: Secondary | ICD-10-CM | POA: Diagnosis not present

## 2021-07-25 DIAGNOSIS — C50412 Malignant neoplasm of upper-outer quadrant of left female breast: Secondary | ICD-10-CM

## 2021-07-25 DIAGNOSIS — Z79899 Other long term (current) drug therapy: Secondary | ICD-10-CM | POA: Insufficient documentation

## 2021-07-25 DIAGNOSIS — Z5111 Encounter for antineoplastic chemotherapy: Secondary | ICD-10-CM | POA: Insufficient documentation

## 2021-07-25 DIAGNOSIS — Z5112 Encounter for antineoplastic immunotherapy: Secondary | ICD-10-CM | POA: Insufficient documentation

## 2021-07-25 MED ORDER — ANASTROZOLE 1 MG PO TABS
1.0000 mg | ORAL_TABLET | Freq: Every day | ORAL | 2 refills | Status: DC
  Filled 2021-07-25: qty 30, 30d supply, fill #0
  Filled 2021-08-25: qty 30, 30d supply, fill #1
  Filled 2021-09-22: qty 30, 30d supply, fill #2

## 2021-07-25 MED ORDER — PREGABALIN 75 MG PO CAPS
75.0000 mg | ORAL_CAPSULE | Freq: Two times a day (BID) | ORAL | 2 refills | Status: DC
  Filled 2021-07-25: qty 60, 30d supply, fill #0
  Filled 2021-08-25: qty 60, 30d supply, fill #1
  Filled 2021-09-22: qty 60, 30d supply, fill #2

## 2021-07-25 MED ORDER — DIPHENHYDRAMINE HCL 25 MG PO CAPS
50.0000 mg | ORAL_CAPSULE | Freq: Once | ORAL | Status: AC
  Administered 2021-07-25: 50 mg via ORAL
  Filled 2021-07-25: qty 2

## 2021-07-25 MED ORDER — ACETAMINOPHEN 325 MG PO TABS
650.0000 mg | ORAL_TABLET | Freq: Once | ORAL | Status: AC
  Administered 2021-07-25: 650 mg via ORAL
  Filled 2021-07-25: qty 2

## 2021-07-25 MED ORDER — PERTUZ-TRASTUZ-HYALURON-ZZXF 60-60-2000 MG-MG-U/ML CHEMO ~~LOC~~ SOLN
10.0000 mL | Freq: Once | SUBCUTANEOUS | Status: AC
Start: 2021-07-25 — End: 2021-07-25
  Administered 2021-07-25: 10 mL via SUBCUTANEOUS
  Filled 2021-07-25: qty 10

## 2021-07-25 MED ORDER — OXYCODONE HCL 10 MG PO TABS: 10.0000 mg | ORAL_TABLET | Freq: Four times a day (QID) | ORAL | 0 refills | Status: DC | PRN

## 2021-07-25 NOTE — Telephone Encounter (Signed)
Call patient If she is still experiencing right lower quadrant pain, please to schedule follow-up as we can decide on appropriate course of next steps.    CT abdomen pelvis was reassuring without an acute process, specifically no diverticulitis.  It does show mild hepatic steatosis and atherosclerosis.   I would like to discuss this in further detail as well as discuss her pain.  Please triage chart and see how she is feeling

## 2021-07-25 NOTE — Progress Notes (Signed)
Hematology/Oncology Consult note [facility]  [phone] [phone]  Patient Care Team: Burnard Hawthorne, FNP as PCP - General (Family Medicine) Rico Junker, RN as Oncology Nurse Navigator   Name of the patient: Tricia Berry  [id number]  [birth year]   Date of visit: 07/25/21  Diagnosis- invasive mammary carcinoma of the left breast stage III ypT2 ypN2 cM0 ER/PR positive and HER2 positive by [facility]  Chief complaint/ Reason for visit-on treatment assessment prior to cycle 5 of adjuvant Herceptin and Perjeta  Heme/Onc history: patient is a 50 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  She then noticed that her lump is gradually getting larger with an area of ulceration on the skin and underwent a diagnostic bilateral mammogram on 09/08/2020 which showed hypoechoic interconnected masses in the left breast from 10:00 to 2 o'clock position spanning at least an area of 4.8 cm.  Ultrasound also showed 5 abnormal lymph nodes in the axilla with cortical thickening.  Both the mass and the lymph nodes were biopsied and was consistent with invasive mammary carcinoma grade 2.  Tumor was ER +91- 100%, PR +11 to 20% and HER-2 negative.  Ki-67 15%. Patient will be meeting [name] from [facility] surgery to discuss surgical management.  She is here for medical oncology recommendations.  In terms of her general health patient is otherwise doing well and does not have significant comorbidities.  She does endorse significant pain in the area of her left breast.  Tylenol and Motrin has not been helping her with this pain.  She lives with her husband and 2 children who have special needs.  No prior history of abnormal breast mammograms or breast biopsies.   PET CT scan showed hypermetabolism in  the area of the left breast but no evidence of hypermetabolism in the left axilla Or evidence of distant metastatic disease.   Neoadjuvant dose dense ACT chemotherapy started on 10/08/2020. Interim ultrasound after 4 cycles of dose dense AC chemotherapy showed overall decrease in volume of the tumor.  Nodularity previously seen on ultrasound was also decreased in size.   Patient completed neoadjuvant AC Taxol chemotherapy and underwent bilateral mastectomy with reconstruction.Final pathology showed fibrocystic changes in the right breast with no malignancy.  3.6 cm invasive mammary carcinoma in the left breast.  7 out of 16 lymph nodes positive for malignancy.  Treatment effect in the breast present but not robust.  Treatment effect in the lymph nodes minimal.  Margins negative.  Overall grade 2.  Extranodal extension present.  ER greater than 90% positive, PR 15% positive.  HER2 +2 equivocal and positive by FISH  Interval history-patient states that her neuropathic pain in her bilateral feet is getting worse.  She is on Lyrica which does not help her a whole lot.  Previously she was on Cymbalta which was discontinued.  Patient states that she felt better when she was on Cymbalta and she also had better control of her mood.  She is often tearful after stopping Cymbalta.  Denies any suicidal or homicidal ideations.  Reports pain in her low back as well as hips  ECOG PS- 1 Pain scale- 6 Opioid associated constipation- no  Review of systems- Review of Systems  Constitutional:  Negative for chills, fever, malaise/fatigue and weight loss.  HENT:  Negative for congestion, ear discharge and nosebleeds.   Eyes:  Negative for blurred vision.  Respiratory:  Negative for cough, hemoptysis, sputum production, shortness of breath and wheezing.   Cardiovascular:  Negative for chest pain, palpitations, orthopnea and claudication.  Gastrointestinal:  Negative for abdominal pain, blood in stool, constipation,  diarrhea, heartburn, melena, nausea and vomiting.  Genitourinary:  Negative for dysuria, flank pain, frequency, hematuria and urgency.  Musculoskeletal:  Positive for back pain and joint pain. Negative for myalgias.  Skin:  Negative for rash.  Neurological:  Positive for sensory change (Peripheral neuropathy). Negative for dizziness, tingling, focal weakness, seizures, weakness and headaches.  Endo/Heme/Allergies:  Does not bruise/bleed easily.  Psychiatric/Behavioral:  Negative for depression and suicidal ideas. The patient does not have insomnia.       Allergies  Allergen Reactions   Morphine Nausea And Vomiting and Other (See Comments)    migranes Other reaction(s): Headache Migraine, vomiting   Sertraline Hcl     REACTION: Worsened symptoms of IBS   Sulfa Antibiotics Rash   Sulfamethoxazole Rash   Sulfonamide Derivatives Rash     Past Medical History:  Diagnosis Date   Anxiety    Breast cancer ([place])    Diverticulitis    Family history of ovarian cancer    GERD (gastroesophageal reflux disease)    IBS (irritable bowel syndrome)    PONV (postoperative nausea and vomiting)    severe migraine and vomiting post anesthesia   Scoliosis      Past Surgical History:  Procedure Laterality Date   ABDOMINAL HYSTERECTOMY     still has ovaries, no gyn cancer, hysterectomy due to endometriosis. NO cervix on exam 01/10/21   BREAST BIOPSY Left 09/15/2020   [place] bx of mass, path pending, Q marker   BREAST BIOPSY Left 09/15/2020   [place] bx of LN, hydromarker, path pending   BREAST RECONSTRUCTION WITH PLACEMENT OF TISSUE EXPANDER AND FLEX HD (ACELLULAR HYDRATED DERMIS) Bilateral 03/17/2021   Procedure: IMMEDIATE BILATERAL BREAST RECONSTRUCTION WITH PLACEMENT OF TISSUE EXPANDER AND FLEX HD (ACELLULAR HYDRATED DERMIS);  Surgeon: Wallace Going, DO;  Location: [place];  Service: Plastics;  Laterality: Bilateral;   IR IMAGING GUIDED PORT INSERTION  10/01/2020   MODIFIED  MASTECTOMY Left 03/17/2021   Procedure: LEFT MODIFIED RADICAL MASTECTOMY;  Surgeon: Erroll Luna, MD;  Location: [place];  Service: General;  Laterality: Left;   PORTA CATH REMOVAL Right 03/17/2021   Procedure: PORTA CATH REMOVAL;  Surgeon: Erroll Luna, MD;  Location: [place];  Service: General;  Laterality: Right;   REMOVAL OF BILATERAL TISSUE EXPANDERS WITH PLACEMENT OF BILATERAL BREAST IMPLANTS Bilateral 05/09/2021   Procedure: REMOVAL OF BILATERAL TISSUE EXPANDERS WITH PLACEMENT OF BILATERAL BREAST IMPLANTS;  Surgeon: Wallace Going, DO;  Location: [place];  Service: Plastics;  Laterality: Bilateral;  90 min   TOTAL MASTECTOMY Right 03/17/2021   Procedure: TOTAL MASTECTOMY;  Surgeon: Erroll Luna, MD;  Location: [place];  Service: General;  Laterality: Right;    Social History   Socioeconomic History   Marital status: Divorced    Spouse name: Not on file   Number of children: 2   Years of education: Not on file   Highest education level: Not on file  Occupational History   Occupation: Nurse    Employer: [employer]  Tobacco Use   Smoking status: Every Day    Packs/day: 0.25    Types: Cigarettes   Smokeless  tobacco: Never   Tobacco comments:    Pt trying to quit now. Has reduced amount significantly  Vaping Use   Vaping Use: Never used  Substance and Sexual Activity   Alcohol use: Not Currently   Drug use: No   Sexual activity: Not Currently    Birth control/protection: None  Other Topics Concern   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 She and her spouse are primary caregivers.    Social Determinants of Health   Financial Resource Strain: Not on file  Food Insecurity: Not on file  Transportation Needs: Not on file  Physical Activity: Not on file  Stress: Not on file  Social Connections: Not on  file  Intimate Partner Violence: Not on file    Family History  Problem Relation Age of Onset   Hypertension Mother    Osteoarthritis Mother    Diverticulitis Mother    Heart failure Father    Hypertension Father    Gout Father    Diverticulitis Brother    Ovarian cancer Paternal Grandmother      Current Outpatient Medications:    cyanocobalamin (,VITAMIN B-12,) 1000 MCG/ML injection, 1000 mcg (1 mL) intramuscular injection in the thigh ( vastus lateralis) once per month., Disp: 3 mL, Rfl: 4   Lactobacillus (PROBIOTIC ACIDOPHILUS PO), Take 1 capsule by mouth daily., Disp: , Rfl:    lidocaine-prilocaine (EMLA) cream, Apply 1 application topically as needed., Disp: 30 g, Rfl: 0   nicotine (NICODERM CQ - DOSED IN MG/24 HOURS) 21 mg/24hr patch, use 1 patch daily, Disp: 28 patch, Rfl: 0   nicotine (NICOTROL) 10 MG inhaler, FOLLOW INSTRUCTIONS ON PACKAGE AS NEEDED FOR SMOKING CESSATION, Disp: 168 each, Rfl: 0   nicotine polacrilex (SM NICOTINE) 2 MG lozenge, Take by mouth., Disp: 72 lozenge, Rfl: 0   ondansetron (ZOFRAN) 8 MG tablet, Take 1 tablet (8 mg total) by mouth every 8 (eight) hours as needed for nausea or vomiting., Disp: 601 tablet, Rfl: 0   anastrozole (ARIMIDEX) 1 MG tablet, Take 1 tablet (1 mg total) by mouth daily., Disp: 30 tablet, Rfl: 2   LORazepam (ATIVAN) 0.5 MG tablet, Take 1 tablet (0.5 mg total) by mouth at bedtime as needed for up to 30 doses for anxiety. (Patient not taking: Reported on 07/25/2021), Disp: 30 tablet, Rfl: 0   Multiple Vitamins-Minerals (MULTIVITAL PO), Take 1 Dose by mouth daily. (Patient not taking: Reported on 07/25/2021), Disp: , Rfl:    Oxycodone HCl 10 MG TABS, Take 1 tablet (10 mg total) by mouth every 6 (six) hours as needed., Disp: 120 tablet, Rfl: 0   pregabalin (LYRICA) 75 MG capsule, Take 1 capsule (75 mg total) by mouth 2 (two) times daily., Disp: 60 capsule, Rfl: 2   traZODone (DESYREL) 50 MG tablet, Take 0.5-1 tablets (25-50 mg total) by  mouth at bedtime as needed for sleep. (Patient not taking: Reported on 07/25/2021), Disp: 30 tablet, Rfl: 3 No current facility-administered medications for this visit.  Facility-Administered Medications Ordered in Other Visits:    goserelin (ZOLADEX) injection 3.6 mg, 3.6 mg, Subcutaneous, Q28 days, Randa Evens C, MD, 3.6 mg at 05/04/21 1150   heparin lock flush 100 unit/mL, 500 Units, Intravenous, Once, Sindy Guadeloupe, MD  Physical exam:  Vitals:   07/25/21 1014  BP: 129/80  Pulse: 74  Resp: 16  Temp: (!) 97.3 F (36.3 C)  SpO2:  100%  Weight: 117 lb 8 oz (53.3 kg)   Physical Exam Constitutional:      General: She is not in acute distress. Cardiovascular:     Rate and Rhythm: Normal rate and regular rhythm.     Heart sounds: Normal heart sounds.  Pulmonary:     Effort: Pulmonary effort is normal.     Breath sounds: Normal breath sounds.  Abdominal:     General: Bowel sounds are normal.     Palpations: Abdomen is soft.  Skin:    General: Skin is warm and dry.  Neurological:     Mental Status: She is alert and oriented to person, place, and time.     CMP Latest Ref Rng & Units 07/01/2021  Glucose 70 - 99 mg/dL 150(H)  BUN 6 - 20 mg/dL 10  Creatinine 0.44 - 1.00 mg/dL 0.74  Sodium 135 - 145 mmol/L 137  Potassium 3.5 - 5.1 mmol/L 3.7  Chloride 98 - 111 mmol/L 103  CO2 22 - 32 mmol/L 26  Calcium 8.9 - 10.3 mg/dL 9.5  Total Protein 6.5 - 8.1 g/dL 7.0  Total Bilirubin 0.3 - 1.2 mg/dL 0.3  Alkaline Phos 38 - 126 U/L 96  AST 15 - 41 U/L 21  ALT 0 - 44 U/L 16   CBC Latest Ref Rng & Units 07/01/2021  WBC 4.0 - 10.5 K/uL 9.0  Hemoglobin 12.0 - 15.0 g/dL 14.2  Hematocrit 36.0 - 46.0 % 42.6  Platelets 150 - 400 K/uL 231    No images are attached to the encounter.  CT ABDOMEN PELVIS W CONTRAST  Result Date: 07/18/2021 CLINICAL DATA:  Intermittent right lower quadrant pain for 1 month, history of left breast cancer EXAM: CT ABDOMEN AND PELVIS WITH CONTRAST TECHNIQUE:  Multidetector CT imaging of the abdomen and pelvis was performed using the standard protocol following bolus administration of intravenous contrast. CONTRAST:  13m OMNIPAQUE IOHEXOL 350 MG/ML SOLN COMPARISON:  12/21/2010, 04/18/2021 FINDINGS: Lower chest: No acute pleural or parenchymal lung disease. Hepatobiliary: Mild diffuse hepatic steatosis. No focal liver abnormality. The gallbladder is unremarkable. Pancreas: Unremarkable. No pancreatic ductal dilatation or surrounding inflammatory changes. Spleen: Normal in size without focal abnormality. Adrenals/Urinary Tract: Kidneys enhance normally and symmetrically. No urinary tract calculi or obstructive uropathy within either kidney. The adrenals and bladder are unremarkable. Stomach/Bowel: There is chronic diverticulosis of the rectosigmoid colon. There are no inflammatory changes to suggest acute diverticulitis. Mild sigmoid colon wall thickening throughout the region of diverticulosis may reflect scarring from previous bouts of diverticulitis. There is a normal appendix within the right lower quadrant. Vascular/Lymphatic: Aortic atherosclerosis. No enlarged abdominal or pelvic lymph nodes. Reproductive: Status post hysterectomy. No adnexal masses. Other: No free fluid or free gas.  No abdominal wall hernia. Musculoskeletal: No acute or destructive bony lesions. Left convex thoracolumbar scoliosis. Reconstructed images demonstrate no additional findings. IMPRESSION: 1. Diverticulosis of the rectosigmoid colon, with no evidence of acute diverticulitis. Mild wall thickening of the sigmoid colon likely reflects scarring from previous bouts of diverticulitis. 2. Normal appendix. 3. Mild hepatic steatosis. 4.  Aortic Atherosclerosis (ICD10-I70.0). Electronically Signed   By: MRanda NgoM.D.   On: 07/18/2021 18:32     Assessment and plan- Patient is a 50y.o. female  with known history of locally advanced left breast cancer which was ER/PR positive and HER2  negative on initial biopsy s/p neoadjuvant AC Taxol chemotherapy final pathology showed 3.6 cm residual breast mass with 7 positive lymph nodes with extracapsular extension.  ER/PR positive and HER2 positive.  She is here for follow-up of following issues:  Locally advanced breast cancer: She will proceed with cycle 5 of adjuvant Herceptin and Perjeta today and plan is to complete 1 year of treatment.  Patient will complete adjuvant radiation treatment this week.  She had a heterogeneous tumor which was residual HER2 positive in the breast but HER2 negative in her lymph nodes.  Tumor was also strongly estrogen positive.  Patient is currently on goserelin for ovarian suppression on a monthly basis which she will receive in 5 days.  Upon completion of radiation treatment patient can start on aromatase inhibitor.  I recommend anastrozole 1 mg p.o. daily.  She will take it for 10 years if tolerated.  Discussed risks and benefits of an anastrozole including all but not limited to fatigue, arthralgias, worsening bone health hot flashes.  Treatment will be given with a curative intent.  Patient understands and agrees to proceed as planned.  Plan for repeat echocardiogram later this month  2.  Chemo induced peripheral neuropathy.  Patient reports better control of symptoms as well as mood when she was on Cymbalta.  She will therefore start back on Cymbalta 60 mg daily.  For now she will continue Lyrica and there are no drug interactions as such between Cymbalta and Lyrica.  If patient does not feel that she benefits much from Lyrica she will slowly taper herself off Lyrica.  Patient is also taking as needed oxycodone for neuropathic pain.  Currently she is taking about 3 tablets a day.  I have again encouraged the patient to try and continuously wean off opioid pain medications with a stop goal of about 4 to 5 months from now.  3.  Low back pain/right hip pain: Not related to malignancy.  Recent PET scan did not show  any evidence of metastatic disease.  I will refer her to orthopedics for this  4.  Also discussed the role of Zometa to reduce the risk of bone metastases given her locally advanced breast cancer.  This will be given every 6 months for up to 3 years.  Discussed risks and benefits of Zometa including all but not limited to fatigue, hypocalcemia and possible osteonecrosis of the jaw.  Patient has complete dentures and therefore does not require dental clearance for this.  We will plan to start Zometa with next treatment.  We will obtain a baseline bone density scan at this time   Visit Diagnosis 1. Malignant neoplasm of upper-outer quadrant of left breast in female, estrogen receptor positive ([place])   2. Encounter for monoclonal antibody treatment for malignancy   3. Chemotherapy-induced peripheral neuropathy ([place])      Dr. Randa Evens, MD, MPH [facility] [phone] 07/25/2021 10:24 PM

## 2021-07-25 NOTE — Telephone Encounter (Signed)
Patient returned call

## 2021-07-26 ENCOUNTER — Other Ambulatory Visit: Payer: Self-pay

## 2021-07-26 ENCOUNTER — Encounter: Payer: Self-pay | Admitting: General Surgery

## 2021-07-26 ENCOUNTER — Telehealth: Payer: Self-pay

## 2021-07-26 ENCOUNTER — Ambulatory Visit
Admission: RE | Admit: 2021-07-26 | Discharge: 2021-07-26 | Disposition: A | Discharge status: Home / Self Care | Payer: No Typology Code available for payment source | Source: Ambulatory Visit | Attending: Radiation Oncology | Admitting: Radiation Oncology

## 2021-07-26 DIAGNOSIS — Z510 Encounter for antineoplastic radiation therapy: Secondary | ICD-10-CM | POA: Diagnosis not present

## 2021-07-26 NOTE — Telephone Encounter (Signed)
Received a medical records request from [employer] [place] for patient disability asking for all of the patients records from 03/23/21 to current. Form has been sent to scan and sent to Medical Records through interoffice mail.

## 2021-07-27 ENCOUNTER — Ambulatory Visit
Admission: RE | Admit: 2021-07-27 | Discharge: 2021-07-27 | Disposition: A | Discharge status: Home / Self Care | Payer: No Typology Code available for payment source | Source: Ambulatory Visit | Attending: Radiation Oncology | Admitting: Radiation Oncology

## 2021-07-27 DIAGNOSIS — Z510 Encounter for antineoplastic radiation therapy: Secondary | ICD-10-CM | POA: Diagnosis not present

## 2021-07-27 NOTE — Telephone Encounter (Signed)
I have spoken with patient & she states that her RLQ pain comes & goes. She said that she feels this more when lying down. She said that she has had a lot of trouble with her right hip "catching". She feels this may be ortho related from over compensation. She wants to to discuss statin with [name] as well & working on better lifestyle choices before going this route.

## 2021-07-27 NOTE — Telephone Encounter (Signed)
Patient called office back. She has been trying since Monday to speak to Arnett's [name]

## 2021-07-27 NOTE — Telephone Encounter (Signed)
noted 

## 2021-07-27 NOTE — Telephone Encounter (Signed)
I did advise that if any changes such as nausea, vomiting, fever or acute pain that she needed to be seen asap at ED. Pt verbalized understanding.

## 2021-07-27 NOTE — Telephone Encounter (Signed)
Appt 08/05/21 with me  Will evaluate patient then

## 2021-07-28 ENCOUNTER — Telehealth: Payer: Self-pay | Admitting: Family

## 2021-07-28 ENCOUNTER — Ambulatory Visit
Admission: RE | Admit: 2021-07-28 | Discharge: 2021-07-28 | Disposition: A | Discharge status: Home / Self Care | Payer: No Typology Code available for payment source | Source: Ambulatory Visit | Attending: Radiation Oncology | Admitting: Radiation Oncology

## 2021-07-28 DIAGNOSIS — Z510 Encounter for antineoplastic radiation therapy: Secondary | ICD-10-CM | POA: Diagnosis not present

## 2021-07-28 NOTE — Telephone Encounter (Signed)
Pt has a lab appt on 08/01/2021, for urine and there is no orders in yet.

## 2021-07-29 ENCOUNTER — Telehealth: Payer: Self-pay

## 2021-07-29 ENCOUNTER — Inpatient Hospital Stay: Payer: No Typology Code available for payment source

## 2021-07-29 ENCOUNTER — Telehealth: Payer: Self-pay | Admitting: Family

## 2021-07-29 ENCOUNTER — Other Ambulatory Visit: Payer: Self-pay | Admitting: Family

## 2021-07-29 ENCOUNTER — Other Ambulatory Visit: Payer: Self-pay

## 2021-07-29 ENCOUNTER — Ambulatory Visit
Admission: RE | Admit: 2021-07-29 | Discharge: 2021-07-29 | Disposition: A | Discharge status: Home / Self Care | Payer: No Typology Code available for payment source | Source: Ambulatory Visit | Attending: Radiation Oncology | Admitting: Radiation Oncology

## 2021-07-29 ENCOUNTER — Other Ambulatory Visit
Admission: RE | Admit: 2021-07-29 | Discharge: 2021-07-29 | Disposition: A | Discharge status: Home / Self Care | Payer: No Typology Code available for payment source | Source: Ambulatory Visit | Attending: Family | Admitting: Family

## 2021-07-29 DIAGNOSIS — M25569 Pain in unspecified knee: Secondary | ICD-10-CM

## 2021-07-29 DIAGNOSIS — R319 Hematuria, unspecified: Secondary | ICD-10-CM

## 2021-07-29 DIAGNOSIS — R1031 Right lower quadrant pain: Secondary | ICD-10-CM

## 2021-07-29 DIAGNOSIS — Z1211 Encounter for screening for malignant neoplasm of colon: Secondary | ICD-10-CM

## 2021-07-29 DIAGNOSIS — C50412 Malignant neoplasm of upper-outer quadrant of left female breast: Secondary | ICD-10-CM

## 2021-07-29 DIAGNOSIS — M545 Low back pain, unspecified: Secondary | ICD-10-CM

## 2021-07-29 LAB — GASTROINTESTINAL PANEL BY PCR, STOOL (REPLACES STOOL CULTURE)

## 2021-07-29 MED ORDER — GOSERELIN ACETATE 3.6 MG ~~LOC~~ IMPL
3.6000 mg | DRUG_IMPLANT | SUBCUTANEOUS | Status: DC
  Administered 2021-07-29: 3.6 mg via SUBCUTANEOUS
  Filled 2021-07-29: qty 3.6

## 2021-07-29 NOTE — Telephone Encounter (Signed)
Patient needing a ortho referral for leg and back pain. Call came in from the [facility]. 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 Referrals have to come from the PCP to be approved.

## 2021-07-29 NOTE — Telephone Encounter (Signed)
Spoke to [place] at Dr. Roseanne Kaufman office in regards to adding a ortho referral for [place] in order to be approved by [facility]. Explained we went ahead and added one as well, but it will not be approved coming from the [facility]. Tricia Berry stated she has sent the message over to the provider to be completed.

## 2021-07-29 NOTE — Telephone Encounter (Signed)
Confirmed signed and faxed procedure form. Form has been sent to scan.

## 2021-08-01 ENCOUNTER — Other Ambulatory Visit: Payer: Self-pay | Admitting: Family

## 2021-08-01 ENCOUNTER — Encounter: Payer: Self-pay | Admitting: Physical Therapy

## 2021-08-01 ENCOUNTER — Other Ambulatory Visit: Payer: Self-pay

## 2021-08-01 ENCOUNTER — Other Ambulatory Visit (INDEPENDENT_AMBULATORY_CARE_PROVIDER_SITE_OTHER): Payer: No Typology Code available for payment source

## 2021-08-01 ENCOUNTER — Ambulatory Visit: Payer: No Typology Code available for payment source | Attending: Surgery | Admitting: Physical Therapy

## 2021-08-01 DIAGNOSIS — R8281 Pyuria: Secondary | ICD-10-CM

## 2021-08-01 DIAGNOSIS — C50912 Malignant neoplasm of unspecified site of left female breast: Secondary | ICD-10-CM | POA: Diagnosis present

## 2021-08-01 DIAGNOSIS — Z483 Aftercare following surgery for neoplasm: Secondary | ICD-10-CM | POA: Insufficient documentation

## 2021-08-01 DIAGNOSIS — M25612 Stiffness of left shoulder, not elsewhere classified: Secondary | ICD-10-CM | POA: Insufficient documentation

## 2021-08-01 DIAGNOSIS — Z170 Estrogen receptor positive status [ER+]: Secondary | ICD-10-CM | POA: Diagnosis present

## 2021-08-01 DIAGNOSIS — R319 Hematuria, unspecified: Secondary | ICD-10-CM | POA: Diagnosis not present

## 2021-08-01 DIAGNOSIS — R293 Abnormal posture: Secondary | ICD-10-CM | POA: Insufficient documentation

## 2021-08-01 LAB — URINALYSIS, ROUTINE W REFLEX MICROSCOPIC
Bilirubin Urine: NEGATIVE
Ketones, ur: NEGATIVE
Nitrite: NEGATIVE
Specific Gravity, Urine: 1.02 (ref 1.000–1.030)
Total Protein, Urine: NEGATIVE
Urine Glucose: NEGATIVE
Urobilinogen, UA: 0.2 (ref 0.0–1.0)
pH: 6 (ref 5.0–8.0)

## 2021-08-01 NOTE — Telephone Encounter (Signed)
Call pt  Orthopedic referral placed Is this for low back pain?  Does she still have abdominal pain? Any groin pain, fever, numbness in legs?  Let us know if you dont hear back within a week in regards to an appointment being scheduled.   Please also make her aware of [facility]  She can go there as soon as today for evaluation

## 2021-08-01 NOTE — Addendum Note (Signed)
Addended by: Burnard Hawthorne on: 08/01/2021 09:41 PM   Modules accepted: Orders

## 2021-08-01 NOTE — Therapy (Signed)
[facility] @ [place], [place], [address] Phone: [phone]   Fax:  [phone]  Physical Therapy Treatment  Patient Details  Name: Tricia Berry MRN: [id number] Date of Birth: October 26, 1970 Referring Provider (PT): Cornett   Encounter Date: 08/01/2021   PT End of Session - 08/01/21 1024     Visit Number 8    Number of Visits 12    Date for PT Re-Evaluation 09/12/21   on hold for 2 wks to allow skin to heal from radiation   PT Start Time 1000    PT Stop Time 1020    PT Time Calculation (min) 20 min    Activity Tolerance Patient tolerated treatment well    Behavior During Therapy [facility] for tasks assessed/performed             Past Medical History:  Diagnosis Date   Anxiety    Breast cancer ([place])    Diverticulitis    Family history of ovarian cancer    GERD (gastroesophageal reflux disease)    IBS (irritable bowel syndrome)    PONV (postoperative nausea and vomiting)    severe migraine and vomiting post anesthesia   Scoliosis     Past Surgical History:  Procedure Laterality Date   ABDOMINAL HYSTERECTOMY     still has ovaries, no gyn cancer, hysterectomy due to endometriosis. NO cervix on exam 01/10/21   BREAST BIOPSY Left 09/15/2020   [place] bx of mass, path pending, Q marker   BREAST BIOPSY Left 09/15/2020   [place] bx of LN, hydromarker, path pending   BREAST RECONSTRUCTION WITH PLACEMENT OF TISSUE EXPANDER AND FLEX HD (ACELLULAR HYDRATED DERMIS) Bilateral 03/17/2021   Procedure: IMMEDIATE BILATERAL BREAST RECONSTRUCTION WITH PLACEMENT OF TISSUE EXPANDER AND FLEX HD (ACELLULAR HYDRATED DERMIS);  Surgeon: Wallace Going, DO;  Location: [facility];  Service: Plastics;  Laterality: Bilateral;   IR IMAGING GUIDED PORT INSERTION  10/01/2020   MODIFIED MASTECTOMY Left 03/17/2021   Procedure: LEFT MODIFIED RADICAL MASTECTOMY;  Surgeon: Erroll Luna, MD;  Location: [facility];  Service: General;  Laterality: Left;   PORTA CATH REMOVAL Right 03/17/2021   Procedure: PORTA CATH REMOVAL;  Surgeon: Erroll Luna, MD;  Location: [facility];  Service: General;  Laterality: Right;   REMOVAL OF BILATERAL TISSUE EXPANDERS WITH PLACEMENT OF BILATERAL BREAST IMPLANTS Bilateral 05/09/2021   Procedure: REMOVAL OF BILATERAL TISSUE EXPANDERS WITH PLACEMENT OF BILATERAL BREAST IMPLANTS;  Surgeon: Wallace Going, DO;  Location: [facility];  Service: Plastics;  Laterality: Bilateral;  90 min   TOTAL MASTECTOMY Right 03/17/2021   Procedure: TOTAL MASTECTOMY;  Surgeon: Erroll Luna, MD;  Location: [facility];  Service: General;  Laterality: Right;    There were no vitals filed for this visit.   Subjective Assessment - 08/01/21 1000     Subjective I just finished radiation on Friday. It is pretty burned but nothing is open. My ROM started to tighten up in the 5th week.    Pertinent History L breast cancer ER+/PR+ HER2-, stage III, underwent a bilateral mastectomy and ALND on L (7/16) on 03/17/21, reconstruction in summer 2022, neuropathy of hands, lower legs and feet, completed radiation, completed radiation Dec17, 2021 to February 18, 2021    Patient Stated Goals to gain info from providers    Currently in Pain? No/denies    Pain Score 0-No pain  [facility] PT Assessment - 08/01/21 0001       AROM   Right Shoulder Flexion 180 Degrees    Right Shoulder ABduction 180 Degrees    Left Shoulder Flexion 160 Degrees    Left Shoulder ABduction 164 Degrees               LYMPHEDEMA/ONCOLOGY QUESTIONNAIRE - 08/01/21 0001       Left Upper Extremity Lymphedema   15 cm Proximal to Olecranon Process 25.4 cm    Olecranon Process 22 cm    15 cm Proximal to Ulnar Styloid Process 21.8 cm    Just Proximal to Ulnar Styloid Process 14.7 cm    Across Hand at [employer] 17.4 cm    At [place] of 2nd Digit 5.6 cm                                       PT Long Term Goals - 08/01/21 1005       PT LONG TERM GOAL #1   Title Pt will return to baseline shoulder ROM measurements and not demonstrate any signs or symptoms of lymphedema.    Baseline 05/31/21- pt has 180 degrees of flex and abd bilaterally    Time 4    Period Weeks    Status Achieved      PT LONG TERM GOAL #2   Title Pt will demonstrate 165 degrees of bilateral flexion to allow pt to reach overhead.    Baseline R 143 L 101; 05/02/21- 172 on R, 175 on L    Time 4    Period Weeks    Status Achieved      PT LONG TERM GOAL #3   Title Pt will demonstrate 165 degrees of bilateral shoulder abduction to allow her to reach out the side    Baseline R 152 L 78; 05/02/21- R 180 L 179    Time 4    Period Weeks    Status Achieved      PT LONG TERM GOAL #4   Title Pt will report she is no longer having muscle spasms across her chest to allow improved comfort    Baseline 05/02/21- pt reports she is no longer having muscle spasms    Time 4    Period Weeks    Status Achieved      PT LONG TERM GOAL #5   Title Pt will be independent in a home exercise program for long term strengthening and stretching.    Time 4    Period Weeks    Status Achieved      Additional Long Term Goals   Additional Long Term Goals Yes      PT LONG TERM GOAL #6   Title Pt will report a 50% improvement in edema in L trunk and chest to allow improved comfort.    Baseline 05/02/21- 20% improved; 05/31/21 no edema present    Time 4    Period Weeks    Status Achieved      PT LONG TERM GOAL #7   Title Pt will be independent in Strength ABC program for long term stretching and strengthening after completion of radiation.    Time 10    Period Weeks    Status On-going      PT LONG TERM GOAL #8   Title Pt will demonstrate baseline ROM (180 degrees) of flexion and abduction of LUE  to allow pt to return to prior level of function.    Baseline 160  flex, 164 abd    Time 4    Period Weeks    Status New    Target Date 08/29/21                   Plan - 08/01/21 1026     Clinical Impression Statement Pt has completed radiation as of 10/7. Her skin is still very red, peeling and a few areas are compromised. Pt has decreased left shoulder ROM after completing radiation. Her ROM is about 20 degrees less than baseline. Will place pt on hold for 2 weeks to allow her skin time to heal before working on AAROM/PROM and AROM. Currently pt does not demonstrate any signs or symptoms of lymphedema. Will see pt 1x/wk to improve ROM and instruct pt in Strength After Breast Cancer program for long term stretching and strengthening.    PT Frequency 1x / week    PT Duration 6 weeks   pt will be on hold for the first 2 weeks to allow her skin to heal from radiation   PT Treatment/Interventions ADLs/Self Care Home Management;Therapeutic exercise;Patient/family education;Scar mobilization;Passive range of motion;Manual techniques;Manual lymph drainage;Compression bandaging;Taping;Vasopneumatic Device    PT Next Visit Plan Cont every 3 month L-Dex screens for up to 2 years from her SLNB, begin AAROM/PROM/AROM to L shoulder and instruct in Strength ABC program    PT Home Exercise Plan post op breast exercises, instructed pt in supine dowel, supine scap series    Consulted and Agree with Plan of Care Patient             Patient will benefit from skilled therapeutic intervention in order to improve the following deficits and impairments:  Pain, Postural dysfunction, Decreased knowledge of precautions, Impaired UE functional use, Increased fascial restricitons, Decreased strength, Decreased range of motion, Decreased scar mobility, Increased edema, Increased muscle spasms  Visit Diagnosis: Stiffness of left shoulder, not elsewhere classified  Aftercare following surgery for neoplasm  Abnormal posture  Malignant 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,  estrogen receptor positive, unspecified site of breast [facility])     Problem List Patient Active Problem List   Diagnosis Date Noted   B12 deficiency 07/21/2021   Atherosclerosis of aorta ([place]) 07/20/2021   Hepatic steatosis 07/20/2021   Abdominal pain 07/18/2021   Breast cancer ([place]) 03/17/2021   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[place]) 03/14/2021   Genetic testing 11/22/2020   Family history of ovarian cancer    Candidal vulvovaginitis 11/01/2020   Low back pain 10/06/2020   Encounter for medical examination to establish care 09/26/2020   Malignant neoplasm of upper-outer quadrant of left breast in female, estrogen receptor positive ([place]) 09/26/2020   BACK STRAIN, LUMBAR 03/08/2010   INTERNAL HEMORRHOIDS 01/27/2010   ANAL FISSURE 01/27/2010   OSTEOARTHRITIS, HANDS, BILATERAL 01/04/2010   ARTHRALGIA 10/05/2009   INSOMNIA, CHRONIC 05/06/2009   ALLERGIC RHINITIS, SEASONAL 02/09/2009   TOBACCO ABUSE 12/29/2008   IBS 10/13/2008   Anxiety state 08/25/2008   DEPRESSION, RECURRENT 08/25/2008    Manus Gunning, PT 08/01/2021, 10:31 AM  [facility] @ [place], [place], [address] Phone: [phone]   Fax:  [phone]  Name: Tricia Berry MRN: [id number] Date of Birth: 08-12-1971   Manus Gunning, PT 08/01/21 10:32 AM

## 2021-08-02 ENCOUNTER — Telehealth: Payer: Self-pay | Admitting: Family

## 2021-08-02 NOTE — Telephone Encounter (Signed)
Joy from [employer] is calling to check on the status of a medical records request sent over on 9/13 for the patient.Please call her at [phone].

## 2021-08-02 NOTE — Telephone Encounter (Signed)
LM for Joy to call back.

## 2021-08-02 NOTE — Telephone Encounter (Signed)
LMTCB

## 2021-08-02 NOTE — Telephone Encounter (Signed)
Patient states the referral for right hip and low back pain.   Patient is having right side abdominal pain rated 8/10 on and off. Worse when laying on that side. Patient has numbness in the right upper thigh and hip area. No groin pain or fever.   Someone contacted her from the ortho referral. Patient is aware of walk-in.

## 2021-08-02 NOTE — Telephone Encounter (Signed)
Call pt I am concerned with rlq pain and severity CT A/p 07/18/21 that we had ordered no acute findings on her CT abdomen and pelvis.  Specifically no diverticulitis or renal stones.  Is pain worse?   Her colonoscopy is due and I would like her to have second opinion with GI to ensure pain is not GI in origin.   She may continue with ortho referral for back and leg pain as not sure if related or different etiologies. '  Is she okay with GI referral?

## 2021-08-03 NOTE — Telephone Encounter (Signed)
Pt stated that she was okay with GI referral bc she stated that she really does not feel this is necessarily bone related. She does want referral sent to female GI. She also said that if we could please refer her to [facility] ortho & she would prefer them instead of [facility]. Her pain is no worse just still there & about the same. She sees you this Friday as well.

## 2021-08-04 ENCOUNTER — Ambulatory Visit
Admission: RE | Admit: 2021-08-04 | Discharge: 2021-08-04 | Disposition: A | Discharge status: Home / Self Care | Payer: No Typology Code available for payment source | Source: Ambulatory Visit | Attending: Oncology | Admitting: Oncology

## 2021-08-04 ENCOUNTER — Other Ambulatory Visit: Payer: Self-pay

## 2021-08-04 ENCOUNTER — Telehealth: Payer: Self-pay

## 2021-08-04 DIAGNOSIS — Z9882 Breast implant status: Secondary | ICD-10-CM | POA: Diagnosis not present

## 2021-08-04 DIAGNOSIS — Z01810 Encounter for preprocedural cardiovascular examination: Secondary | ICD-10-CM | POA: Insufficient documentation

## 2021-08-04 DIAGNOSIS — Z170 Estrogen receptor positive status [ER+]: Secondary | ICD-10-CM | POA: Diagnosis not present

## 2021-08-04 DIAGNOSIS — C50412 Malignant neoplasm of upper-outer quadrant of left female breast: Secondary | ICD-10-CM | POA: Diagnosis not present

## 2021-08-04 DIAGNOSIS — Z0189 Encounter for other specified special examinations: Secondary | ICD-10-CM | POA: Diagnosis not present

## 2021-08-04 LAB — ECHOCARDIOGRAM COMPLETE
AR max vel: 2.86 cm2
AV Area VTI: 3.33 cm2
AV Area mean vel: 3.31 cm2
AV Mean grad: 3 mmHg
AV Peak grad: 6.4 mmHg
Ao pk vel: 1.26 m/s
Area-P 1/2: 4.1 cm2
MV VTI: 3.5 cm2
S' Lateral: 2.91 cm

## 2021-08-04 NOTE — Addendum Note (Signed)
Addended by: Burnard Hawthorne on: 08/04/2021 10:03 AM   Modules accepted: Orders

## 2021-08-04 NOTE — Telephone Encounter (Signed)
Call patient Referral for GI is in place. Let us know if you dont hear back within a week in regards to an appointment being scheduled.   Tricia Berry,  Can we change Ortho referral to [facility] clinic versus [facility]?

## 2021-08-04 NOTE — Progress Notes (Signed)
*  PRELIMINARY RESULTS* Echocardiogram 2D Echocardiogram has been performed.  Tricia Berry 08/04/2021, 11:56 AM

## 2021-08-04 NOTE — Telephone Encounter (Signed)
Patient in office tomorrow to follow-up.

## 2021-08-04 NOTE — Telephone Encounter (Signed)
Reached out to [facility] to check on pts referral. Ortho stated they tried reaching ou to pt on 10/11 and received no answer. Pt stated she is currently waiting on her PCP to also put referral in due to insurance purposes, will call them after her current doctors appointment to check on the referral then she will reach out to Ortho to schedule appointment.

## 2021-08-05 ENCOUNTER — Ambulatory Visit (INDEPENDENT_AMBULATORY_CARE_PROVIDER_SITE_OTHER): Payer: No Typology Code available for payment source

## 2021-08-05 ENCOUNTER — Encounter: Payer: Self-pay | Admitting: Family

## 2021-08-05 ENCOUNTER — Other Ambulatory Visit: Payer: Self-pay

## 2021-08-05 ENCOUNTER — Ambulatory Visit (INDEPENDENT_AMBULATORY_CARE_PROVIDER_SITE_OTHER): Payer: No Typology Code available for payment source | Admitting: Family

## 2021-08-05 ENCOUNTER — Ambulatory Visit: Payer: No Typology Code available for payment source

## 2021-08-05 VITALS — BP 120/78 | HR 80 | Temp 98.2°F | Ht 60.0 in | Wt 115.0 lb

## 2021-08-05 DIAGNOSIS — I700 Atherosclerosis of aorta: Secondary | ICD-10-CM | POA: Diagnosis not present

## 2021-08-05 DIAGNOSIS — R300 Dysuria: Secondary | ICD-10-CM | POA: Insufficient documentation

## 2021-08-05 DIAGNOSIS — M545 Low back pain, unspecified: Secondary | ICD-10-CM

## 2021-08-05 LAB — LIPID PANEL
Cholesterol: 248 mg/dL — ABNORMAL HIGH (ref 0–200)
HDL: 37.8 mg/dL — ABNORMAL LOW (ref >39.00)
NonHDL: 209.75
Total CHOL/HDL Ratio: 7
Triglycerides: 345 mg/dL — ABNORMAL HIGH (ref 0.0–149.0)
VLDL: 69 mg/dL — ABNORMAL HIGH (ref 0.0–40.0)

## 2021-08-05 LAB — LDL CHOLESTEROL, DIRECT: Direct LDL: 163 mg/dL

## 2021-08-05 MED ORDER — ROSUVASTATIN CALCIUM 5 MG PO TABS
5.0000 mg | ORAL_TABLET | Freq: Every evening | ORAL | 3 refills | Status: DC
  Filled 2021-08-08: qty 90, 90d supply, fill #0
  Filled 2021-11-23: qty 90, 90d supply, fill #1
  Filled 2022-02-27: qty 90, 90d supply, fill #2
  Filled 2022-05-28: qty 90, 90d supply, fill #3

## 2021-08-05 MED ORDER — DULOXETINE HCL 30 MG PO CPEP
30.0000 mg | ORAL_CAPSULE | Freq: Every day | ORAL | 3 refills | Status: DC
  Filled 2021-08-08: qty 90, 90d supply, fill #0

## 2021-08-05 MED ORDER — NITROFURANTOIN MONOHYD MACRO 100 MG PO CAPS
100.0000 mg | ORAL_CAPSULE | Freq: Two times a day (BID) | ORAL | 0 refills | Status: DC
Start: 2021-08-05 — End: 2021-09-13

## 2021-08-05 NOTE — Assessment & Plan Note (Signed)
Pyuria seen on urinalysis 4 days ago.  Pending urine culture.  Due to patient preference, she prefers to be treated empirically over the weekend.  Have sent in [place].  Concern chemotherapy has called vaginal dryness however the setting of history of breast cancer, she is not a candidate for vaginal estrogen.  If urinary symptoms persist despite absence of infection, will consult uro-GYN

## 2021-08-05 NOTE — Progress Notes (Signed)
Subjective:    Patient ID: Tricia Berry, female    DOB: Jun 20, 1971, 50 y.o.   MRN: [id number]  CC: [name] is a 50 y.o. female who presents today for follow up.   HPI: Most bothered by right groin pain and right sided hip pain. She also endorses right low back pain.  Pain worse when laying on right side. Patient has numbness in the right upper thigh and hip area since starting chemo, this is unchanged. No fever.  Walking is most comfortable. Sitting for longer periods can cause pain.   Referral in place to gastroenterology and orthopedics  Atherosclerosis seen on CT abdomen and pelvis.  Family history CAD.   HISTORY:  Past Medical History:  Diagnosis Date   Anxiety    Breast cancer ([place])    Diverticulitis    Family history of ovarian cancer    GERD (gastroesophageal reflux disease)    IBS (irritable bowel syndrome)    PONV (postoperative nausea and vomiting)    severe migraine and vomiting post anesthesia   Scoliosis    Past Surgical History:  Procedure Laterality Date   ABDOMINAL HYSTERECTOMY     still has ovaries, no gyn cancer, hysterectomy due to endometriosis. NO cervix on exam 01/10/21   BREAST BIOPSY Left 09/15/2020   [place] bx of mass, path pending, Q marker   BREAST BIOPSY Left 09/15/2020   [place] bx of LN, hydromarker, path pending   BREAST RECONSTRUCTION WITH PLACEMENT OF TISSUE EXPANDER AND FLEX HD (ACELLULAR HYDRATED DERMIS) Bilateral 03/17/2021   Procedure: IMMEDIATE BILATERAL BREAST RECONSTRUCTION WITH PLACEMENT OF TISSUE EXPANDER AND FLEX HD (ACELLULAR HYDRATED DERMIS);  Surgeon: Wallace Going, DO;  Location: [facility];  Service: Plastics;  Laterality: Bilateral;   IR IMAGING GUIDED PORT INSERTION  10/01/2020   MODIFIED MASTECTOMY Left 03/17/2021   Procedure: LEFT MODIFIED RADICAL MASTECTOMY;  Surgeon: Erroll Luna, MD;  Location: [facility];  Service: General;  Laterality: Left;   PORTA CATH REMOVAL Right  03/17/2021   Procedure: PORTA CATH REMOVAL;  Surgeon: Erroll Luna, MD;  Location: [facility];  Service: General;  Laterality: Right;   REMOVAL OF BILATERAL TISSUE EXPANDERS WITH PLACEMENT OF BILATERAL BREAST IMPLANTS Bilateral 05/09/2021   Procedure: REMOVAL OF BILATERAL TISSUE EXPANDERS WITH PLACEMENT OF BILATERAL BREAST IMPLANTS;  Surgeon: Wallace Going, DO;  Location: [facility];  Service: Plastics;  Laterality: Bilateral;  90 min   TOTAL MASTECTOMY Right 03/17/2021   Procedure: TOTAL MASTECTOMY;  Surgeon: Erroll Luna, MD;  Location: [facility];  Service: General;  Laterality: Right;   Family History  Problem Relation Age of Onset   Hypertension Mother    Osteoarthritis Mother    Diverticulitis Mother    Heart failure Father    Hypertension Father    Gout Father    Diverticulitis Brother    Ovarian cancer Paternal Grandmother     Allergies: Morphine, Sertraline hcl, Sulfa antibiotics, Sulfamethoxazole, and Sulfonamide derivatives Current Outpatient Medications on File Prior to Visit  Medication Sig Dispense Refill   anastrozole (ARIMIDEX) 1 MG tablet Take 1 tablet (1 mg total) by mouth daily. 30 tablet 2   cyanocobalamin (,VITAMIN B-12,) 1000 MCG/ML injection 1000 mcg (1 mL) intramuscular injection in the thigh ( vastus lateralis) once per month. 3 mL 4   Lactobacillus (PROBIOTIC ACIDOPHILUS PO) Take 1 capsule by mouth daily.     lidocaine-prilocaine (EMLA) cream Apply 1 application topically as needed. 30 g  0   LORazepam (ATIVAN) 0.5 MG tablet Take 1 tablet (0.5 mg total) by mouth at bedtime as needed for up to 30 doses for anxiety. 30 tablet 0   Multiple Vitamins-Minerals (MULTIVITAL PO) Take 1 Dose by mouth daily.     nicotine (NICODERM CQ - DOSED IN MG/24 HOURS) 21 mg/24hr patch use 1 patch daily 28 patch 0   nicotine (NICOTROL) 10 MG inhaler FOLLOW INSTRUCTIONS ON PACKAGE AS NEEDED FOR SMOKING CESSATION 168 each 0    nicotine polacrilex (SM NICOTINE) 2 MG lozenge Take by mouth. 72 lozenge 0   ondansetron (ZOFRAN) 8 MG tablet Take 1 tablet (8 mg total) by mouth every 8 (eight) hours as needed for nausea or vomiting. 601 tablet 0   Oxycodone HCl 10 MG TABS Take 1 tablet (10 mg total) by mouth every 6 (six) hours as needed. 120 tablet 0   pregabalin (LYRICA) 75 MG capsule Take 1 capsule (75 mg total) by mouth 2 (two) times daily. 60 capsule 2   traZODone (DESYREL) 50 MG tablet Take 0.5-1 tablets (25-50 mg total) by mouth at bedtime as needed for sleep. 30 tablet 3   Current Facility-Administered Medications on File Prior to Visit  Medication Dose Route Frequency Provider Last Rate Last Admin   goserelin (ZOLADEX) injection 3.6 mg  3.6 mg Subcutaneous Q28 days Sindy Guadeloupe, MD   3.6 mg at 05/04/21 1150   heparin lock flush 100 unit/mL  500 Units Intravenous Once Sindy Guadeloupe, MD        Social History   Tobacco Use   Smoking status: Every Day    Packs/day: 0.25    Types: Cigarettes   Smokeless tobacco: Never   Tobacco comments:    Pt trying to quit now. Has reduced amount significantly  Vaping Use   Vaping Use: Never used  Substance Use Topics   Alcohol use: Not Currently   Drug use: No    Review of Systems  Constitutional:  Negative for chills and fever.  Respiratory:  Negative for cough.   Cardiovascular:  Negative for chest pain and palpitations.  Gastrointestinal:  Negative for nausea and vomiting.  Musculoskeletal:  Positive for back pain.  Neurological:  Positive for numbness. Negative for weakness.     Objective:    BP 120/78 (BP Location: Left Arm, Patient Position: Sitting, Cuff Size: Normal)   Pulse 80   Temp 98.2 F (36.8 C) (Oral)   Ht 5' (1.524 m)   Wt 115 lb (52.2 kg)   SpO2 98%   BMI 22.46 kg/m  BP Readings from Last 3 Encounters:  08/05/21 120/78  07/25/21 129/80  07/18/21 104/72   Wt Readings from Last 3 Encounters:  08/05/21 115 lb (52.2 kg)  07/25/21 117 lb  8 oz (53.3 kg)  07/18/21 116 lb 3.2 oz (52.7 kg)    Physical Exam Vitals reviewed.  Constitutional:      Appearance: She is well-developed.  Eyes:     Conjunctiva/sclera: Conjunctivae normal.  Cardiovascular:     Rate and Rhythm: Normal rate and regular rhythm.     Pulses: Normal pulses.     Heart sounds: Normal heart sounds.  Pulmonary:     Effort: Pulmonary effort is normal.     Breath sounds: Normal breath sounds. No wheezing, rhonchi or rales.  Musculoskeletal:     Lumbar back: No swelling, edema, spasms, tenderness or bony tenderness. Normal range of motion.       Legs:     Comments:  Full range of motion with flexion, tension, lateral side bends. No bony tenderness. No pain, numbness, tingling elicited with single leg raise bilaterally.  Right Hip: No limp or waddling gait. Full ROM with flexion and hip rotation in flexion.   No pain of lateral hip with  (flexion-abduction-external rotation) test.  Bilateral discrete areas lateral side of hips decreased sensation as marked on diagram No pain with deep palpation of greater trochanter.     Skin:    General: Skin is warm and dry.  Neurological:     Mental Status: She is alert.     Sensory: No sensory deficit.     Deep Tendon Reflexes:     Reflex Scores:      Patellar reflexes are 2+ on the right side and 2+ on the left side.    Comments: Sensation and strength intact bilateral lower extremities.  Psychiatric:        Speech: Speech normal.        Behavior: Behavior normal.        Thought Content: Thought content normal.       Assessment & Plan:   Problem List Items Addressed This Visit       Cardiovascular and Mediastinum   Atherosclerosis of aorta ([place])    Patient agreeable to starting Crestor 5 mg.  LDL greater than 100 ( 163).  Repeat CMP in 6 weeks.      Relevant Medications   rosuvastatin (CRESTOR) 5 MG tablet   Other Relevant Orders   Lipid panel (Completed)   Comprehensive metabolic panel      Other   Dysuria    Pyuria seen on urinalysis 4 days ago.  Pending urine culture.  Due to patient preference, she prefers to be treated empirically over the weekend.  Have sent in [place].  Concern chemotherapy has called vaginal dryness however the setting of history of breast cancer, she is not a candidate for vaginal estrogen.  If urinary symptoms persist despite absence of infection, will consult uro-GYN      Relevant Medications   nitrofurantoin, macrocrystal-monohydrate, (MACROBID) 100 MG capsule   Other Relevant Orders   Urinalysis, Routine w reflex microscopic   Urine Culture   Low back pain - Primary    Presentation consistent with pelvic etiology.  Pending pelvic x-rays.  Known history of scoliosis and certainly think that is contributory.  Patient very willing to pursue physical therapy which hass been ordered today.  Restart Cymbalta 30 mg a patient will increase to 60 mg in 7 days.  She will let me know how she is doing      Relevant Medications   DULoxetine (CYMBALTA) 30 MG capsule   Other Relevant Orders   Ambulatory referral to Physical Therapy   DG Hip Unilat W OR W/O Pelvis 2-3 Views Right     I am having Lucyann G. Sherlin "[name]" start on DULoxetine, rosuvastatin, and nitrofurantoin (macrocrystal-monohydrate). I am also having her maintain her Multiple Vitamins-Minerals (MULTIVITAL PO), Lactobacillus (PROBIOTIC ACIDOPHILUS PO), traZODone, ondansetron, LORazepam, lidocaine-prilocaine, nicotine, nicotine polacrilex, Nicotrol, cyanocobalamin, Oxycodone HCl, pregabalin, and anastrozole.   Meds ordered this encounter  Medications   DULoxetine (CYMBALTA) 30 MG capsule    Sig: Take 1 capsule (30 mg total) by mouth daily.    Dispense:  90 capsule    Refill:  3    Order Specific Question:   Supervising Provider    Answer:   Deborra Medina L [[id number]]   rosuvastatin (CRESTOR) 5 MG tablet  Sig: Take 1 tablet (5 mg total) by mouth every evening.    Dispense:  90 tablet     Refill:  3    Order Specific Question:   Supervising Provider    Answer:   Deborra Medina L [[id number]]   nitrofurantoin, macrocrystal-monohydrate, (MACROBID) 100 MG capsule    Sig: Take 1 capsule (100 mg total) by mouth 2 (two) times daily. Take with food.    Dispense:  10 capsule    Refill:  0    Order Specific Question:   Supervising Provider    Answer:   Crecencio Mc [[id number]]    Return precautions given.   Risks, benefits, and alternatives of the medications and treatment plan prescribed today were discussed, and patient expressed understanding.   Education regarding symptom management and diagnosis given to patient on AVS.  Continue to follow with Burnard Hawthorne, FNP for routine health maintenance.   Alden Benjamin Royle and I agreed with plan.   Mable Paris, FNP

## 2021-08-05 NOTE — Assessment & Plan Note (Signed)
Presentation consistent with pelvic etiology.  Pending pelvic x-rays.  Known history of scoliosis and certainly think that is contributory.  Patient very willing to pursue physical therapy which hass been ordered today.  Restart Cymbalta 30 mg a patient will increase to 60 mg in 7 days.  She will let me know how she is doing

## 2021-08-05 NOTE — Assessment & Plan Note (Addendum)
Patient agreeable to starting Crestor 5 mg.  LDL greater than 100 ( 163).  Repeat CMP in 6 weeks.

## 2021-08-05 NOTE — Patient Instructions (Addendum)
Start macrobid Will await urine studies  Ensure to take probiotics while on antibiotics and also for 2 weeks after completion. This can either be by eating yogurt daily or taking a probiotic supplement over the counter such as Culturelle.It is important to re-colonize the gut with good bacteria and also to prevent any diarrheal infections associated with antibiotic use.   Start cymbalta 30mg  and then increase 60mg  taken daily  Start crestor  Let me know how you are doing

## 2021-08-08 ENCOUNTER — Encounter: Payer: Self-pay | Admitting: Family

## 2021-08-08 ENCOUNTER — Other Ambulatory Visit: Payer: Self-pay

## 2021-08-08 LAB — URINE CULTURE
MICRO NUMBER:: 12504688
SPECIMEN QUALITY:: ADEQUATE

## 2021-08-08 NOTE — Addendum Note (Signed)
Addended by: Neta Ehlers on: 08/08/2021 08:25 AM   Modules accepted: Orders

## 2021-08-11 ENCOUNTER — Other Ambulatory Visit: Payer: Self-pay

## 2021-08-11 ENCOUNTER — Ambulatory Visit
Admission: RE | Admit: 2021-08-11 | Discharge: 2021-08-11 | Disposition: A | Discharge status: Home / Self Care | Payer: No Typology Code available for payment source | Source: Ambulatory Visit | Attending: Oncology | Admitting: Oncology

## 2021-08-11 DIAGNOSIS — C50412 Malignant neoplasm of upper-outer quadrant of left female breast: Secondary | ICD-10-CM | POA: Insufficient documentation

## 2021-08-11 DIAGNOSIS — Z170 Estrogen receptor positive status [ER+]: Secondary | ICD-10-CM | POA: Diagnosis present

## 2021-08-12 ENCOUNTER — Telehealth: Payer: Self-pay | Admitting: Family

## 2021-08-12 NOTE — Telephone Encounter (Signed)
Rejection Reason - Patient Declined" Virl Cagey said on Aug 12, 2021 8:28 AM  "Thank you for the referral. We have left a voicemail for this patient to contact our office to schedule an appointment. We will keep you updated as we can. Thank you" Virl Cagey said on Aug 02, 2021 10:08 AM  Msg from [facility]

## 2021-08-15 ENCOUNTER — Other Ambulatory Visit: Payer: Self-pay

## 2021-08-15 ENCOUNTER — Other Ambulatory Visit: Payer: Self-pay | Admitting: Oncology

## 2021-08-15 ENCOUNTER — Inpatient Hospital Stay: Payer: No Typology Code available for payment source

## 2021-08-15 ENCOUNTER — Other Ambulatory Visit: Payer: Self-pay | Admitting: Pharmacist

## 2021-08-15 MED ORDER — NICOTINE 10 MG IN INHA
RESPIRATORY_TRACT | 0 refills | Status: DC
  Filled 2021-08-15 – 2021-09-30 (×3): qty 168, 30d supply, fill #0

## 2021-08-16 ENCOUNTER — Ambulatory Visit: Payer: No Typology Code available for payment source

## 2021-08-16 ENCOUNTER — Encounter: Payer: Self-pay | Admitting: Plastic Surgery

## 2021-08-16 ENCOUNTER — Encounter: Payer: Self-pay | Admitting: Oncology

## 2021-08-16 ENCOUNTER — Other Ambulatory Visit: Payer: Self-pay

## 2021-08-16 ENCOUNTER — Ambulatory Visit (INDEPENDENT_AMBULATORY_CARE_PROVIDER_SITE_OTHER): Payer: No Typology Code available for payment source | Admitting: Plastic Surgery

## 2021-08-16 DIAGNOSIS — C50412 Malignant neoplasm of upper-outer quadrant of left female breast: Secondary | ICD-10-CM | POA: Diagnosis not present

## 2021-08-16 DIAGNOSIS — Z9013 Acquired absence of bilateral breasts and nipples: Secondary | ICD-10-CM

## 2021-08-16 DIAGNOSIS — C50912 Malignant neoplasm of unspecified site of left female breast: Secondary | ICD-10-CM

## 2021-08-16 DIAGNOSIS — M25612 Stiffness of left shoulder, not elsewhere classified: Secondary | ICD-10-CM | POA: Diagnosis not present

## 2021-08-16 DIAGNOSIS — R293 Abnormal posture: Secondary | ICD-10-CM

## 2021-08-16 DIAGNOSIS — Z483 Aftercare following surgery for neoplasm: Secondary | ICD-10-CM

## 2021-08-16 DIAGNOSIS — Z9010 Acquired absence of unspecified breast and nipple: Secondary | ICD-10-CM | POA: Insufficient documentation

## 2021-08-16 DIAGNOSIS — Z170 Estrogen receptor positive status [ER+]: Secondary | ICD-10-CM | POA: Diagnosis not present

## 2021-08-16 MED ORDER — NICOTINE POLACRILEX 2 MG MT LOZG
LOZENGE | OROMUCOSAL | 0 refills | Status: DC
  Filled 2021-08-16: qty 72, 20d supply, fill #0

## 2021-08-16 MED ORDER — LORAZEPAM 0.5 MG PO TABS
0.5000 mg | ORAL_TABLET | Freq: Every evening | ORAL | 0 refills | Status: DC | PRN
  Filled 2021-08-16: qty 30, 30d supply, fill #0

## 2021-08-16 MED ORDER — NICOTINE 21 MG/24HR TD PT24
MEDICATED_PATCH | Freq: Every day | TRANSDERMAL | 0 refills | Status: DC
  Filled 2021-08-16 – 2022-07-06 (×3): qty 28, 28d supply, fill #0

## 2021-08-16 NOTE — Therapy (Signed)
[facility] @ [place], [place], [address] Phone: [phone]   Fax:  [phone]  Physical Therapy Treatment  Patient Details  Name: Tricia Berry MRN: [id number] Date of Birth: 10-Dec-1970 Referring Provider (PT): [facility]   Encounter Date: 08/16/2021   PT End of Session - 08/16/21 1221     Visit Number 9    Number of Visits 12    Date for PT Re-Evaluation 09/12/21    PT Start Time 1110    PT Stop Time 1208    PT Time Calculation (min) 58 min    Activity Tolerance Patient tolerated treatment well    Behavior During Therapy WFL for tasks assessed/performed             Past Medical History:  Diagnosis Date   Anxiety    Breast cancer ([facility])    Diverticulitis    Family history of ovarian cancer    GERD (gastroesophageal reflux disease)    IBS (irritable bowel syndrome)    PONV (postoperative nausea and vomiting)    severe migraine and vomiting post anesthesia   Scoliosis     Past Surgical History:  Procedure Laterality Date   ABDOMINAL HYSTERECTOMY     still has ovaries, no gyn cancer, hysterectomy due to endometriosis. NO cervix on exam 01/10/21   BREAST BIOPSY Left 09/15/2020   [place] bx of mass, path pending, Q marker   BREAST BIOPSY Left 09/15/2020   [place] bx of LN, hydromarker, path pending   BREAST RECONSTRUCTION WITH PLACEMENT OF TISSUE EXPANDER AND FLEX HD (ACELLULAR HYDRATED DERMIS) Bilateral 03/17/2021   Procedure: IMMEDIATE BILATERAL BREAST RECONSTRUCTION WITH PLACEMENT OF TISSUE EXPANDER AND FLEX HD (ACELLULAR HYDRATED DERMIS);  Surgeon: Wallace Going, DO;  Location: [facility];  Service: Plastics;  Laterality: Bilateral;   IR IMAGING GUIDED PORT INSERTION  10/01/2020   MODIFIED MASTECTOMY Left 03/17/2021   Procedure: LEFT MODIFIED RADICAL MASTECTOMY;  Surgeon: Erroll Luna, MD;  Location: [facility];  Service: General;  Laterality: Left;   PORTA CATH  REMOVAL Right 03/17/2021   Procedure: PORTA CATH REMOVAL;  Surgeon: Erroll Luna, MD;  Location: [facility];  Service: General;  Laterality: Right;   REMOVAL OF BILATERAL TISSUE EXPANDERS WITH PLACEMENT OF BILATERAL BREAST IMPLANTS Bilateral 05/09/2021   Procedure: REMOVAL OF BILATERAL TISSUE EXPANDERS WITH PLACEMENT OF BILATERAL BREAST IMPLANTS;  Surgeon: Wallace Going, DO;  Location: [facility];  Service: Plastics;  Laterality: Bilateral;  90 min   TOTAL MASTECTOMY Right 03/17/2021   Procedure: TOTAL MASTECTOMY;  Surgeon: Erroll Luna, MD;  Location: [facility];  Service: General;  Laterality: Right;    There were no vitals filed for this visit.   Subjective Assessment - 08/16/21 1115     Subjective I just finished radiation Oct 7 and my skin is healing well but I am still so tight!    Pertinent History L breast cancer ER+/PR+ HER2-, stage III, underwent a bilateral mastectomy and ALND on L (7/16) on 03/17/21, reconstruction in summer 2022, neuropathy of hands, lower legs and feet, completed radiation, completed radiation Dec17, 2021 to February 18, 2021    Patient Stated Goals to gain info from providers    Currently in Pain? No/denies                               [facility] Adult PT Treatment/Exercise -  08/16/21 0001       Manual Therapy   Manual Therapy Myofascial release;Scapular mobilization;Passive ROM;Soft tissue mobilization    Soft tissue mobilization With cocoa butter to Lt lateral trunk where pt most palpably tight and being mindful of healing tissue from radiation; then to Lt upper trap and medial scapular border for trigger point release when in Rt S/L    Myofascial Release --    Scapular Mobilization In Rt S/L to Lt scapular into protraction and retraction, scapular depression during P/ROM    Passive ROM In Supine to Lt shoulder into flexion, abduction and D2 to pts tolerance                           PT Long Term Goals - 08/01/21 1005       PT LONG TERM GOAL #1   Title Pt will return to baseline shoulder ROM measurements and not demonstrate any signs or symptoms of lymphedema.    Baseline 05/31/21- pt has 180 degrees of flex and abd bilaterally    Time 4    Period Weeks    Status Achieved      PT LONG TERM GOAL #2   Title Pt will demonstrate 165 degrees of bilateral flexion to allow pt to reach overhead.    Baseline R 143 L 101; 05/02/21- 172 on R, 175 on L    Time 4    Period Weeks    Status Achieved      PT LONG TERM GOAL #3   Title Pt will demonstrate 165 degrees of bilateral shoulder abduction to allow her to reach out the side    Baseline R 152 L 78; 05/02/21- R 180 L 179    Time 4    Period Weeks    Status Achieved      PT LONG TERM GOAL #4   Title Pt will report she is no longer having muscle spasms across her chest to allow improved comfort    Baseline 05/02/21- pt reports she is no longer having muscle spasms    Time 4    Period Weeks    Status Achieved      PT LONG TERM GOAL #5   Title Pt will be independent in a home exercise program for long term strengthening and stretching.    Time 4    Period Weeks    Status Achieved      Additional Long Term Goals   Additional Long Term Goals Yes      PT LONG TERM GOAL #6   Title Pt will report a 50% improvement in edema in L trunk and chest to allow improved comfort.    Baseline 05/02/21- 20% improved; 05/31/21 no edema present    Time 4    Period Weeks    Status Achieved      PT LONG TERM GOAL #7   Title Pt will be independent in Strength ABC program for long term stretching and strengthening after completion of radiation.    Time 10    Period Weeks    Status On-going      PT LONG TERM GOAL #8   Title Pt will demonstrate baseline ROM (180 degrees) of flexion and abduction of LUE to allow pt to return to prior level of function.    Baseline 160 flex, 164 abd    Time 4    Period Weeks     Status New    Target Date 08/29/21  Plan - 08/16/21 1222     Clinical Impression Statement Pts first full treatment since completing radiation. Her skin is still discolored but appears to be healing very well with just some dryness noted, no skin openings or tears. Cocoa butter used for STM to prevent any further friction. Once pt was able to relax with VCs and UE oscillations we were able to attain full P/ROM of Lt shoulder. Focused STM to Lt lateral trunk and in Rt S/L to medial scapular border with scapular mobs as well. Overall pt had improved shoulder and scapular mobility by end of session.    Examination-Activity Limitations Reach Overhead;Carry;Lift;Dressing    Examination-Participation Restrictions Driving;Occupation;Yard Work;Meal Prep;Community Activity;Cleaning;Laundry;Shop    Stability/Clinical Decision Making Stable/Uncomplicated    Rehab Potential Good    PT Frequency 1x / week    PT Duration 6 weeks    PT Treatment/Interventions ADLs/Self Care Home Management;Therapeutic exercise;Patient/family education;Scar mobilization;Passive range of motion;Manual techniques;Manual lymph drainage;Compression bandaging;Taping;Vasopneumatic Device    PT Next Visit Plan Cont AAROM/PROM/AROM to L shoulder and STM being mindful of healing tissue from radiation; instruct in Strength ABC program; Cont every 3 month L-Dex screens for up to 2 years from her SLNB    PT Home Exercise Plan post op breast exercises, instructed pt in supine dowel, supine scap series    Consulted and Agree with Plan of Care Patient             Patient will benefit from skilled therapeutic intervention in order to improve the following deficits and impairments:  Pain, Postural dysfunction, Decreased knowledge of precautions, Impaired UE functional use, Increased fascial restricitons, Decreased strength, Decreased range of motion, Decreased scar mobility, Increased edema, Increased muscle  spasms  Visit Diagnosis: Stiffness of left shoulder, not elsewhere classified  Aftercare following surgery for neoplasm  Abnormal posture  Malignant 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, estrogen receptor positive, unspecified site of breast [facility])     Problem List Patient Active Problem List   Diagnosis Date Noted   Acquired absence of breast 08/16/2021   Dysuria 08/05/2021   B12 deficiency 07/21/2021   Atherosclerosis of aorta ([facility]) 07/20/2021   Hepatic steatosis 07/20/2021   Abdominal pain 07/18/2021   Breast cancer ([facility]) 03/17/2021   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[facility]) 03/14/2021   Genetic testing 11/22/2020   Family history of ovarian cancer    Candidal vulvovaginitis 11/01/2020   Low back pain 10/06/2020   Encounter for medical examination to establish care 09/26/2020   Malignant neoplasm of upper-outer quadrant of left breast in female, estrogen receptor positive ([facility]) 09/26/2020   BACK STRAIN, LUMBAR 03/08/2010   INTERNAL HEMORRHOIDS 01/27/2010   ANAL FISSURE 01/27/2010   OSTEOARTHRITIS, HANDS, BILATERAL 01/04/2010   ARTHRALGIA 10/05/2009   INSOMNIA, CHRONIC 05/06/2009   ALLERGIC RHINITIS, SEASONAL 02/09/2009   TOBACCO ABUSE 12/29/2008   IBS 10/13/2008   Anxiety state 08/25/2008   DEPRESSION, RECURRENT 08/25/2008    Otelia Limes, PTA 08/16/2021, 12:32 PM  [facility] @ [facility] [place], [place], [address] Phone: [phone]   Fax:  [phone]  Name: Tricia Berry MRN: [id number] Date of Birth: 1971/10/03

## 2021-08-16 NOTE — Progress Notes (Signed)
   Subjective:    Patient ID: Tricia Berry, female    DOB: [birth year], 50 y.o.   MRN: [id number]  The patient is a 50 year old female here for follow-up on her bilateral breast reconstruction.  She had implants placed in July.  She finished her radiation 3 weeks ago.  She has Mentor smooth round high-profile saline implants in place with 320 cc in each.  She has done really well through the radiation but did get quite a bit of skin damage.  She notices that her left implant is perky whereas the right 1 has settled.  She has a little bit of loss of volume in the upper and medial aspect of the right breast.   Review of Systems  Constitutional: Negative.   Eyes: Negative.   Respiratory: Negative.    Cardiovascular: Negative.   Gastrointestinal: Negative.   Endocrine: Negative.   Genitourinary: Negative.   Musculoskeletal: Negative.   Skin: Negative.   Neurological: Negative.   Hematological: Negative.   Psychiatric/Behavioral: Negative.        Objective:   Physical Exam Nursing note reviewed.  Constitutional:      Appearance: Normal appearance.  HENT:     Head: Normocephalic and atraumatic.  Cardiovascular:     Rate and Rhythm: Normal rate.     Pulses: Normal pulses.  Abdominal:     Palpations: Abdomen is soft.  Skin:    Coloration: Skin is not jaundiced.     Findings: No bruising or lesion.  Neurological:     Mental Status: She is alert and oriented to person, place, and time.       Assessment & Plan:     ICD-10-CM   1. Malignant neoplasm of upper-outer quadrant of left breast in female, estrogen receptor positive ([facility])  C50.412    Z17.0     2. Acquired absence of both breasts  Z90.13       Pictures were obtained of the patient and placed in the chart with the patient's or guardian's permission. Patient is a good candidate for fat filling.  I would still recommend that we wait a good 6 months before we do anything to avoid radiation complications.  She would  like some nipple areola tattooing at some point as well.

## 2021-08-17 ENCOUNTER — Encounter: Payer: Self-pay | Admitting: Physical Therapy

## 2021-08-18 ENCOUNTER — Other Ambulatory Visit: Payer: Self-pay | Admitting: Nurse Practitioner

## 2021-08-18 ENCOUNTER — Inpatient Hospital Stay: Payer: No Typology Code available for payment source

## 2021-08-18 ENCOUNTER — Other Ambulatory Visit: Payer: Self-pay

## 2021-08-18 VITALS — BP 112/78 | HR 86 | Temp 95.5°F | Resp 20 | Wt 116.0 lb

## 2021-08-18 DIAGNOSIS — C50412 Malignant neoplasm of upper-outer quadrant of left female breast: Secondary | ICD-10-CM | POA: Diagnosis not present

## 2021-08-18 DIAGNOSIS — Z170 Estrogen receptor positive status [ER+]: Secondary | ICD-10-CM | POA: Diagnosis not present

## 2021-08-18 DIAGNOSIS — Z510 Encounter for antineoplastic radiation therapy: Secondary | ICD-10-CM | POA: Diagnosis not present

## 2021-08-18 LAB — BASIC METABOLIC PANEL
Anion gap: 10 (ref 5–15)
BUN: 10 mg/dL (ref 6–20)
CO2: 25 mmol/L (ref 22–32)
Calcium: 9.4 mg/dL (ref 8.9–10.3)
Chloride: 102 mmol/L (ref 98–111)
Creatinine, Ser: 0.81 mg/dL (ref 0.44–1.00)
GFR, Estimated: >60 mL/min (ref >60)
Glucose, Bld: 107 mg/dL — ABNORMAL HIGH (ref 70–99)
Potassium: 4 mmol/L (ref 3.5–5.1)
Sodium: 137 mmol/L (ref 135–145)

## 2021-08-18 MED ORDER — ACETAMINOPHEN 325 MG PO TABS
650.0000 mg | ORAL_TABLET | Freq: Once | ORAL | Status: AC
  Administered 2021-08-18: 650 mg via ORAL
  Filled 2021-08-18: qty 2

## 2021-08-18 MED ORDER — DIPHENHYDRAMINE HCL 25 MG PO CAPS
50.0000 mg | ORAL_CAPSULE | Freq: Once | ORAL | Status: AC
  Administered 2021-08-18: 50 mg via ORAL
  Filled 2021-08-18: qty 2

## 2021-08-18 MED ORDER — SODIUM CHLORIDE 0.9 % IV SOLN
Freq: Once | INTRAVENOUS | Status: DC
  Filled 2021-08-18: qty 250

## 2021-08-18 MED ORDER — ZOLEDRONIC ACID 4 MG/100ML IV SOLN
4.0000 mg | INTRAVENOUS | Status: DC
  Administered 2021-08-18: 4 mg via INTRAVENOUS
  Filled 2021-08-18: qty 100

## 2021-08-18 MED ORDER — PERTUZ-TRASTUZ-HYALURON-ZZXF 60-60-2000 MG-MG-U/ML CHEMO ~~LOC~~ SOLN
10.0000 mL | Freq: Once | SUBCUTANEOUS | Status: AC
Start: 2021-08-18 — End: 2021-08-18
  Administered 2021-08-18: 10 mL via SUBCUTANEOUS
  Filled 2021-08-18: qty 10

## 2021-08-18 MED ORDER — SODIUM CHLORIDE 0.9 % IV SOLN
Freq: Once | INTRAVENOUS | Status: AC
  Filled 2021-08-18: qty 250

## 2021-08-18 NOTE — Patient Instructions (Signed)
[facility]COL[facility]  Discharge Instructions: Thank you for choosing [facility] to provide your oncology and hematology care.  If you have a lab appointment with the [facility], please go directly to the [facility] and check in at the registration area.  Wear comfortable clothing and clothing appropriate for easy access to any Portacath or PICC line.   We strive to give you quality time with your provider. You may need to reschedule your appointment if you arrive late (15 or more minutes).  Arriving late affects you and other patients whose appointments are after yours.  Also, if you miss three or more appointments without notifying the office, you may be dismissed from the clinic at the provider's discretion.      For prescription refill requests, have your pharmacy contact our office and allow 72 hours for refills to be completed.    Today you received the following chemotherapy and/or immunotherapy agents: PHESGO      To help prevent nausea and vomiting after your treatment, we encourage you to take your nausea medication as directed.  BELOW ARE SYMPTOMS THAT SHOULD BE REPORTED IMMEDIATELY: *FEVER GREATER THAN 100.4 F (38 C) OR HIGHER *CHILLS OR SWEATING *NAUSEA AND VOMITING THAT IS NOT CONTROLLED WITH YOUR NAUSEA MEDICATION *UNUSUAL SHORTNESS OF BREATH *UNUSUAL BRUISING OR BLEEDING *URINARY PROBLEMS (pain or burning when urinating, or frequent urination) *BOWEL PROBLEMS (unusual diarrhea, constipation, pain near the anus) TENDERNESS IN MOUTH AND THROAT WITH OR WITHOUT PRESENCE OF ULCERS (sore throat, sores in mouth, or a toothache) UNUSUAL RASH, SWELLING OR PAIN  UNUSUAL VAGINAL DISCHARGE OR ITCHING   Items with * indicate a potential emergency and should be followed up as soon as possible or go to the Emergency Department if any problems should occur.  Please show the CHEMOTHERAPY ALERT CARD or IMMUNOTHERAPY ALERT CARD at check-in to  the Emergency Department and triage nurse.  Should you have questions after your visit or need to cancel or reschedule your appointment, please contact [facility]  [phone] and follow the prompts.  Office hours are 8:00 a.m. to 4:30 p.m. Monday - Friday. Please note that voicemails left after 4:00 p.m. may not be returned until the following business day.  We are closed weekends and major holidays. You have access to a nurse at all times for urgent questions. Please call the main number to the clinic [phone] and follow the prompts.  For any non-urgent questions, you may also contact your provider using MyChart. We now offer e-Visits for anyone 11 and older to request care online for non-urgent symptoms. For details visit [email or web address].   Also download the MyChart app! Go to the app store, search "MyChart", open the app, select [facility], and log in with your MyChart username and password.  Due to Covid, a mask is required upon entering the hospital/clinic. If you do not have a mask, one will be given to you upon arrival. For doctor visits, patients may have 1 support person aged 9 or older with them. For treatment visits, patients cannot have anyone with them due to current Covid guidelines and our immunocompromised population. Pertuzumab; Trastuzumab; Hyaluronidase Injection What is this medication? PERTUZUMAB (per TOOZ ue mab); TRASTUZUMAB (tras TOO zoo mab); HYALURONIDASE (hye al ur ON i dase) is used to treat breast cancer. Pertuzumab and trastuzumab are a monoclonal antibodies. Hyaluronidase is used to improve the effects of pertuzumab and trastuzumab. This medicine may be used for other purposes;  ask your health care provider or pharmacist if you have questions. COMMON BRAND NAME(S): PHESGO What should I tell my care team before I take this medication? They need to know if you have any of these conditions: heart disease high blood  pressure history of irregular heartbeat lung or breathing disease, like asthma an unusual or allergic reaction to pertuzumab, trastuzumab, hyaluronidase, other medicines, foods, dyes, or preservatives pregnant or trying to get pregnant breast-feeding How should I use this medication? This medicine is for injection under the skin. It may be given by a health care professional in a hospital or clinic setting or a health care professional may give you this medicine at home. Talk to your pediatrician regarding the use of this medicine in children. Special care may be needed. Overdosage: If you think you have taken too much of this medicine contact a poison control center or emergency room at once. NOTE: This medicine is only for you. Do not share this medicine with others. What if I miss a dose? It is important not to miss your dose. Call your doctor or health care professional if you are unable to keep an appointment. What may interact with this medication? This medicine may interact with the following medications: certain types of chemotherapy, such as daunorubicin, doxorubicin, epirubicin, and idarubicin This list may not describe all possible interactions. Give your health care provider a list of all the medicines, herbs, non-prescription drugs, or dietary supplements you use. Also tell them if you smoke, drink alcohol, or use illegal drugs. Some items may interact with your medicine. What should I watch for while using this medication? Visit your health care professional for regular checks on your progress. Tell your health care professional if your symptoms do not start to get better or if they get worse. Your condition will be monitored carefully while you are receiving this medicine. Do not become pregnant while taking this medicine or for 7 months after stopping it. Women should inform their doctor if they wish to become pregnant or think they might be pregnant. There is a potential for  serious side effects to an unborn child. Talk to your health care professional or pharmacist for more information. What side effects may I notice from receiving this medication? Side effects that you should report to your doctor or health care professional as soon as possible: allergic reactions like skin rash, itching or hives, swelling of the face, lips, or tongue breathing problems chest pain cough dizziness feeling faint; lightheaded, falls fever or chills loss of consciousness pain, tingling, numbness in the hands or feet palpitations sudden weight gain swelling of the ankles or legs unusually weak or tired Side effects that usually do not require medical attention (report these to your doctor or health care professional if they continue or are bothersome): diarrhea hair loss joint pain muscle pain nausea, vomiting pain, redness, or irritation at site where injected tiredness This list may not describe all possible side effects. Call your doctor for medical advice about side effects. You may report side effects to FDA at 1-800-FDA-1088. Where should I keep my medication? Keep out of the reach of children. If you are using this medicine at home, you will be instructed on how to store it. Throw away any unused medicine after the expiration date on the label. NOTE: This sheet is a summary. It may not cover all possible information. If you have questions about this medicine, talk to your doctor, pharmacist, or health care provider.  2022 Elsevier/Gold  Standard (2019-12-16 12:39:07) Zoledronic Acid Injection (Hypercalcemia, Oncology) What is this medication? ZOLEDRONIC ACID (ZOE le dron ik AS id) slows calcium loss from bones. It high calcium levels in the blood from some kinds of cancer. It may be used in other people at risk for bone loss. This medicine may be used for other purposes; ask your health care provider or pharmacist if you have questions. COMMON BRAND NAME(S):  Zometa What should I tell my care team before I take this medication? They need to know if you have any of these conditions: cancer dehydration dental disease kidney disease liver disease low levels of calcium in the blood lung or breathing disease (asthma) receiving steroids like dexamethasone or prednisone an unusual or allergic reaction to zoledronic acid, other medicines, foods, dyes, or preservatives pregnant or trying to get pregnant breast-feeding How should I use this medication? This drug is injected into a vein. It is given by a health care provider in a hospital or clinic setting. Talk to your health care provider about the use of this drug in children. Special care may be needed. Overdosage: If you think you have taken too much of this medicine contact a poison control center or emergency room at once. NOTE: This medicine is only for you. Do not share this medicine with others. What if I miss a dose? Keep appointments for follow-up doses. It is important not to miss your dose. Call your health care provider if you are unable to keep an appointment. What may interact with this medication? certain antibiotics given by injection NSAIDs, medicines for pain and inflammation, like ibuprofen or naproxen some diuretics like bumetanide, furosemide teriparatide thalidomide This list may not describe all possible interactions. Give your health care provider a list of all the medicines, herbs, non-prescription drugs, or dietary supplements you use. Also tell them if you smoke, drink alcohol, or use illegal drugs. Some items may interact with your medicine. What should I watch for while using this medication? Visit your health care provider for regular checks on your progress. It may be some time before you see the benefit from this drug. Some people who take this drug have severe bone, joint, or muscle pain. This drug may also increase your risk for jaw problems or a broken thigh bone.  Tell your health care provider right away if you have severe pain in your jaw, bones, joints, or muscles. Tell you health care provider if you have any pain that does not go away or that gets worse. Tell your dentist and dental surgeon that you are taking this drug. You should not have major dental surgery while on this drug. See your dentist to have a dental exam and fix any dental problems before starting this drug. Take good care of your teeth while on this drug. Make sure you see your dentist for regular follow-up appointments. You should make sure you get enough calcium and vitamin D while you are taking this drug. Discuss the foods you eat and the vitamins you take with your health care provider. Check with your health care provider if you have severe diarrhea, nausea, and vomiting, or if you sweat a lot. The loss of too much body fluid may make it dangerous for you to take this drug. You may need blood work done while you are taking this drug. Do not become pregnant while taking this drug. Women should inform their health care provider if they wish to become pregnant or think they might be pregnant. There  is potential for serious harm to an unborn child. Talk to your health care provider for more information. What side effects may I notice from receiving this medication? Side effects that you should report to your doctor or health care provider as soon as possible: allergic reactions (skin rash, itching or hives; swelling of the face, lips, or tongue) bone pain infection (fever, chills, cough, sore throat, pain or trouble passing urine) jaw pain, especially after dental work joint pain kidney injury (trouble passing urine or change in the amount of urine) low blood pressure (dizziness; feeling faint or lightheaded, falls; unusually weak or tired) low calcium levels (fast heartbeat; muscle cramps or pain; pain, tingling, or numbness in the hands or feet; seizures) low magnesium levels (fast,  irregular heartbeat; muscle cramp or pain; muscle weakness; tremors; seizures) low red blood cell counts (trouble breathing; feeling faint; lightheaded, falls; unusually weak or tired) muscle pain redness, blistering, peeling, or loosening of the skin, including inside the mouth severe diarrhea swelling of the ankles, feet, hands trouble breathing Side effects that usually do not require medical attention (report to your doctor or health care provider if they continue or are bothersome): anxious constipation coughing depressed mood eye irritation, itching, or pain fever general ill feeling or flu-like symptoms nausea pain, redness, or irritation at site where injected trouble sleeping This list may not describe all possible side effects. Call your doctor for medical advice about side effects. You may report side effects to FDA at 1-800-FDA-1088. Where should I keep my medication? This drug is given in a hospital or clinic. It will not be stored at home. NOTE: This sheet is a summary. It may not cover all possible information. If you have questions about this medicine, talk to your doctor, pharmacist, or health care provider.  2022 Elsevier/Gold Standard (2019-07-24 09:13:00)

## 2021-08-19 ENCOUNTER — Other Ambulatory Visit: Payer: Self-pay

## 2021-08-22 ENCOUNTER — Encounter: Payer: Self-pay | Admitting: Oncology

## 2021-08-22 ENCOUNTER — Other Ambulatory Visit: Payer: Self-pay

## 2021-08-23 ENCOUNTER — Encounter: Payer: Self-pay | Admitting: Physical Therapy

## 2021-08-23 ENCOUNTER — Ambulatory Visit: Payer: No Typology Code available for payment source | Attending: Surgery | Admitting: Physical Therapy

## 2021-08-23 ENCOUNTER — Other Ambulatory Visit: Payer: Self-pay

## 2021-08-23 DIAGNOSIS — Z483 Aftercare following surgery for neoplasm: Secondary | ICD-10-CM | POA: Diagnosis present

## 2021-08-23 DIAGNOSIS — Z170 Estrogen receptor positive status [ER+]: Secondary | ICD-10-CM | POA: Diagnosis present

## 2021-08-23 DIAGNOSIS — R293 Abnormal posture: Secondary | ICD-10-CM | POA: Diagnosis present

## 2021-08-23 DIAGNOSIS — C50912 Malignant neoplasm of unspecified site of left female breast: Secondary | ICD-10-CM | POA: Diagnosis present

## 2021-08-23 DIAGNOSIS — M25612 Stiffness of left shoulder, not elsewhere classified: Secondary | ICD-10-CM | POA: Insufficient documentation

## 2021-08-23 NOTE — Therapy (Signed)
[facility] @ [address] [place], [place], [address] Phone: [phone]   Fax:  [phone]  Physical Therapy Treatment  Patient Details  Name: Tricia Berry MRN: [id number] Date of Birth: 1971/05/24 Referring Provider (PT): Cornett   Encounter Date: 08/23/2021   PT End of Session - 08/23/21 1156     Visit Number 10    Number of Visits 12    Date for PT Re-Evaluation 09/12/21    PT Start Time 1109    PT Stop Time 1151    PT Time Calculation (min) 42 min    Activity Tolerance Patient tolerated treatment well    Behavior During Therapy WFL for tasks assessed/performed             Past Medical History:  Diagnosis Date   Anxiety    Breast cancer ([place])    Diverticulitis    Family history of ovarian cancer    GERD (gastroesophageal reflux disease)    IBS (irritable bowel syndrome)    PONV (postoperative nausea and vomiting)    severe migraine and vomiting post anesthesia   Scoliosis     Past Surgical History:  Procedure Laterality Date   ABDOMINAL HYSTERECTOMY     still has ovaries, no gyn cancer, hysterectomy due to endometriosis. NO cervix on exam 01/10/21   BREAST BIOPSY Left 09/15/2020   [place] bx of mass, path pending, Q marker   BREAST BIOPSY Left 09/15/2020   [place] bx of LN, hydromarker, path pending   BREAST RECONSTRUCTION WITH PLACEMENT OF TISSUE EXPANDER AND FLEX HD (ACELLULAR HYDRATED DERMIS) Bilateral 03/17/2021   Procedure: IMMEDIATE BILATERAL BREAST RECONSTRUCTION WITH PLACEMENT OF TISSUE EXPANDER AND FLEX HD (ACELLULAR HYDRATED DERMIS);  Surgeon: Wallace Going, DO;  Location: [facility];  Service: Plastics;  Laterality: Bilateral;   IR IMAGING GUIDED PORT INSERTION  10/01/2020   MODIFIED MASTECTOMY Left 03/17/2021   Procedure: LEFT MODIFIED RADICAL MASTECTOMY;  Surgeon: Erroll Luna, MD;  Location: [facility];  Service: General;  Laterality: Left;   PORTA CATH  REMOVAL Right 03/17/2021   Procedure: PORTA CATH REMOVAL;  Surgeon: Erroll Luna, MD;  Location: [facility];  Service: General;  Laterality: Right;   REMOVAL OF BILATERAL TISSUE EXPANDERS WITH PLACEMENT OF BILATERAL BREAST IMPLANTS Bilateral 05/09/2021   Procedure: REMOVAL OF BILATERAL TISSUE EXPANDERS WITH PLACEMENT OF BILATERAL BREAST IMPLANTS;  Surgeon: Wallace Going, DO;  Location: [facility];  Service: Plastics;  Laterality: Bilateral;  90 min   TOTAL MASTECTOMY Right 03/17/2021   Procedure: TOTAL MASTECTOMY;  Surgeon: Erroll Luna, MD;  Location: [facility];  Service: General;  Laterality: Right;    There were no vitals filed for this visit.   Subjective Assessment - 08/23/21 1110     Subjective The skin is dry. The tightness is still there but it is much better.    Pertinent History L breast cancer ER+/PR+ HER2-, stage III, underwent a bilateral mastectomy and ALND on L (7/16) on 03/17/21, reconstruction in summer 2022, neuropathy of hands, lower legs and feet, completed radiation, completed radiation Dec17, 2021 to February 18, 2021    Patient Stated Goals to gain info from providers    Currently in Pain? No/denies    Pain Score 0-No pain                [facility] PT Assessment - 08/23/21 0001       AROM   Left Shoulder  Flexion 173 Degrees   increased tightness   Left Shoulder ABduction 173 Degrees   with increased tightness, pt had to move slowly                          [facility] Adult PT Treatment/Exercise - 08/23/21 0001       Manual Therapy   Myofascial Release to cording in L axilla - 1 very thick cord palpable that extends down in to axilla and upper arm                          PT Long Term Goals - 08/01/21 1005       PT LONG TERM GOAL #1   Title Pt will return to baseline shoulder ROM measurements and not demonstrate any signs or symptoms of lymphedema.    Baseline 05/31/21- pt has  180 degrees of flex and abd bilaterally    Time 4    Period Weeks    Status Achieved      PT LONG TERM GOAL #2   Title Pt will demonstrate 165 degrees of bilateral flexion to allow pt to reach overhead.    Baseline R 143 L 101; 05/02/21- 172 on R, 175 on L    Time 4    Period Weeks    Status Achieved      PT LONG TERM GOAL #3   Title Pt will demonstrate 165 degrees of bilateral shoulder abduction to allow her to reach out the side    Baseline R 152 L 78; 05/02/21- R 180 L 179    Time 4    Period Weeks    Status Achieved      PT LONG TERM GOAL #4   Title Pt will report she is no longer having muscle spasms across her chest to allow improved comfort    Baseline 05/02/21- pt reports she is no longer having muscle spasms    Time 4    Period Weeks    Status Achieved      PT LONG TERM GOAL #5   Title Pt will be independent in a home exercise program for long term strengthening and stretching.    Time 4    Period Weeks    Status Achieved      Additional Long Term Goals   Additional Long Term Goals Yes      PT LONG TERM GOAL #6   Title Pt will report a 50% improvement in edema in L trunk and chest to allow improved comfort.    Baseline 05/02/21- 20% improved; 05/31/21 no edema present    Time 4    Period Weeks    Status Achieved      PT LONG TERM GOAL #7   Title Pt will be independent in Strength ABC program for long term stretching and strengthening after completion of radiation.    Time 10    Period Weeks    Status On-going      PT LONG TERM GOAL #8   Title Pt will demonstrate baseline ROM (180 degrees) of flexion and abduction of LUE to allow pt to return to prior level of function.    Baseline 160 flex, 164 abd    Time 4    Period Weeks    Status New    Target Date 08/29/21                   Plan -  08/23/21 1157     Clinical Impression Statement Spent entire session focused on myofascial release to cording in her left axilla. Pt's ROM is now full but she has  end range tightness and discomfort in her axilla. One very thick cord palpable in her axilla extending down in to chest and slightly in to upper arm. Cording less palpable by end of session. Encouraged pt to continue to stretch at home.    PT Frequency 1x / week    PT Duration 6 weeks    PT Treatment/Interventions ADLs/Self Care Home Management;Therapeutic exercise;Patient/family education;Scar mobilization;Passive range of motion;Manual techniques;Manual lymph drainage;Compression bandaging;Taping;Vasopneumatic Device    PT Next Visit Plan MFR to L axilla in area of cording instruct in Strength ABC program; Cont every 3 month L-Dex screens for up to 2 years from her SLNB    PT Home Exercise Plan post op breast exercises, instructed pt in supine dowel, supine scap series    Consulted and Agree with Plan of Care Patient             Patient will benefit from skilled therapeutic intervention in order to improve the following deficits and impairments:  Pain, Postural dysfunction, Decreased knowledge of precautions, Impaired UE functional use, Increased fascial restricitons, Decreased strength, Decreased range of motion, Decreased scar mobility, Increased edema, Increased muscle spasms  Visit Diagnosis: Stiffness of left shoulder, not elsewhere classified  Aftercare following surgery for neoplasm  Abnormal posture  Malignant 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, estrogen receptor positive, unspecified site of breast [facility])     Problem List Patient Active Problem List   Diagnosis Date Noted   Acquired absence of breast 08/16/2021   Dysuria 08/05/2021   B12 deficiency 07/21/2021   Atherosclerosis of aorta ([place]) 07/20/2021   Hepatic steatosis 07/20/2021   Abdominal pain 07/18/2021   Breast cancer ([place]) 03/17/2021   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[facility] [place]) 03/14/2021   Genetic testing 11/22/2020   Family history of ovarian cancer    Candidal vulvovaginitis 11/01/2020   Low back pain  10/06/2020   Encounter for medical examination to establish care 09/26/2020   Malignant neoplasm of upper-outer quadrant of left breast in female, estrogen receptor positive ([place]) 09/26/2020   BACK STRAIN, LUMBAR 03/08/2010   INTERNAL HEMORRHOIDS 01/27/2010   ANAL FISSURE 01/27/2010   OSTEOARTHRITIS, HANDS, BILATERAL 01/04/2010   ARTHRALGIA 10/05/2009   INSOMNIA, CHRONIC 05/06/2009   ALLERGIC RHINITIS, SEASONAL 02/09/2009   TOBACCO ABUSE 12/29/2008   IBS 10/13/2008   Anxiety state 08/25/2008   DEPRESSION, RECURRENT 08/25/2008    Manus Gunning, PT 08/23/2021, 11:59 AM  [facility] @ [place], [place], [address] Phone: [phone]   Fax:  [phone]  Name: Tricia Berry MRN: [id number] Date of Birth: Mar 09, 1971  Manus Gunning, PT 08/23/21 11:59 AM

## 2021-08-24 ENCOUNTER — Encounter: Payer: Self-pay | Admitting: Oncology

## 2021-08-25 ENCOUNTER — Encounter: Payer: Self-pay | Admitting: Physical Therapy

## 2021-08-25 ENCOUNTER — Other Ambulatory Visit: Payer: Self-pay | Admitting: Oncology

## 2021-08-25 ENCOUNTER — Other Ambulatory Visit: Payer: Self-pay

## 2021-08-25 ENCOUNTER — Ambulatory Visit: Payer: No Typology Code available for payment source | Attending: Family | Admitting: Physical Therapy

## 2021-08-25 DIAGNOSIS — M6281 Muscle weakness (generalized): Secondary | ICD-10-CM | POA: Diagnosis present

## 2021-08-25 DIAGNOSIS — G8929 Other chronic pain: Secondary | ICD-10-CM | POA: Insufficient documentation

## 2021-08-25 DIAGNOSIS — M545 Low back pain, unspecified: Secondary | ICD-10-CM | POA: Diagnosis present

## 2021-08-25 DIAGNOSIS — M533 Sacrococcygeal disorders, not elsewhere classified: Secondary | ICD-10-CM | POA: Diagnosis present

## 2021-08-25 DIAGNOSIS — R262 Difficulty in walking, not elsewhere classified: Secondary | ICD-10-CM | POA: Insufficient documentation

## 2021-08-25 NOTE — Therapy (Signed)
[facility] [address] [place], [address] Phone: [phone]   Fax:  [phone]  Physical Therapy Evaluation  Patient Details  Name: Tricia Berry MRN: [id number] Date of Birth: 09/18/1971 Referring Provider (PT): Burnard Hawthorne, FNP   Encounter Date: 08/25/2021   PT End of Session - 08/25/21 1342     Visit Number 1    Number of Visits 24    Date for PT Re-Evaluation 11/17/21    Authorization Type Trail [employer] reporting period from 08/25/2021    Progress Note Due on Visit 10    PT Start Time 1035    PT Stop Time 1115    PT Time Calculation (min) 40 min    Activity Tolerance Patient tolerated treatment well    Behavior During Therapy [facility] for tasks assessed/performed             Past Medical History:  Diagnosis Date   Anxiety    Breast cancer ([place])    Diverticulitis    Family history of ovarian cancer    GERD (gastroesophageal reflux disease)    IBS (irritable bowel syndrome)    PONV (postoperative nausea and vomiting)    severe migraine and vomiting post anesthesia   Scoliosis     Past Surgical History:  Procedure Laterality Date   ABDOMINAL HYSTERECTOMY     still has ovaries, no gyn cancer, hysterectomy due to endometriosis. NO cervix on exam 01/10/21   BREAST BIOPSY Left 09/15/2020   [place] bx of mass, path pending, Q marker   BREAST BIOPSY Left 09/15/2020   [place] bx of LN, hydromarker, path pending   BREAST RECONSTRUCTION WITH PLACEMENT OF TISSUE EXPANDER AND FLEX HD (ACELLULAR HYDRATED DERMIS) Bilateral 03/17/2021   Procedure: IMMEDIATE BILATERAL BREAST RECONSTRUCTION WITH PLACEMENT OF TISSUE EXPANDER AND FLEX HD (ACELLULAR HYDRATED DERMIS);  Surgeon: Wallace Going, DO;  Location: [place];  Service: Plastics;  Laterality: Bilateral;   IR IMAGING GUIDED PORT INSERTION  10/01/2020   MODIFIED MASTECTOMY Left 03/17/2021   Procedure: LEFT MODIFIED RADICAL  MASTECTOMY;  Surgeon: Erroll Luna, MD;  Location: [place];  Service: General;  Laterality: Left;   PORTA CATH REMOVAL Right 03/17/2021   Procedure: PORTA CATH REMOVAL;  Surgeon: Erroll Luna, MD;  Location: [place];  Service: General;  Laterality: Right;   REMOVAL OF BILATERAL TISSUE EXPANDERS WITH PLACEMENT OF BILATERAL BREAST IMPLANTS Bilateral 05/09/2021   Procedure: REMOVAL OF BILATERAL TISSUE EXPANDERS WITH PLACEMENT OF BILATERAL BREAST IMPLANTS;  Surgeon: Wallace Going, DO;  Location: [facility];  Service: Plastics;  Laterality: Bilateral;  90 min   TOTAL MASTECTOMY Right 03/17/2021   Procedure: TOTAL MASTECTOMY;  Surgeon: Erroll Luna, MD;  Location: [facility];  Service: General;  Laterality: Right;    There were no vitals filed for this visit.    Subjective Assessment - 08/25/21 1048     Subjective Patient states right sided back pain started getting much worse several weeks ago and has been getting progressively worse. Since she started cancer treatment a year ago it has gotten worse. 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 Her pain gradually came on and got worse. PET scan shows no metastases of cancer to the region. She notices it is worse when she stands up after prolonged sitting, rolling over  in bed (going from right to left side), and she gets sudden grabbing pain when walking.  She was diagnosed with left sided breast cancer stage IIIb in Nov. 2021. She has completed chemo and radiation and she is still getting injections. She has also had a double mastectomy with reconstruction. She is currently seeing PT for lymphedema management and left shoulder ROM. She gets injections every three weeks and every 28 days for her ovaries, she gets oral medication daily and an infusion for her bones. No more chemo and  radiation planned. Patient reports she has a really deep deep ache across the base of her spine. Makes her feel like she doesn't want to sit up due to a "drained/tired" feeling. Agnosterol makes all her joints sore but her back pain is on top of that. Shooting pain and grabbing happen at different times. [name] saw an orthopedist about right hip pain and he thought her pain was from the right SIJ. Her right leg is shorter than the left and he suggested she get measured for a a heel lift. She has an order for this and is planning to get imaging and a heel lift at the hospital.    Pertinent History Patient is a 50 y.o. female who presents to outpatient physical therapy with a referral for medical diagnosis Right-sided low back pain without sciatica, unspecified chronicity. This patient's chief complaints consist of worsening chronic R sided low back pain that intermittently radiates down the leg and across the back to the left side leading to the following functional deficits: sudden grabbing stops her in her tracks and she must sit down, stretching, bending, work responsibilities, dressing, petting cats, caring for cats, lifting, moving patients, yardwork, gardening, functional mobility, etc.  Relevant past medical history and comorbidities include L breast cancer stage IIIb (dx Nov 2021. ER+/PR+ HER2-, underwent a bilateral mastectomy and ALND on L (7/16) on 03/17/21, reconstruction in summer 2022, neuropathy of hands, lower legs and feet, completed chemo, completed radiation Dec17, 2021 to February 18, 2021, currently taking oral medication and infusions), scoliosis, internal hemorrhoids, atherosclerosis of aorta, hepatic steatosis, chemotherapy -induced neuropathy, anxiety/depression, chronic insomnia, arthralgia, B12 deficiency, GERD, IBS, diverticulitis, current smoker (trying to quit), UTIs, abdominal hysterectomy.Patient denies hx of  stroke, seizures, lung problem, major cardiac events, diabetes, changes in  bowel or bladder problems, new onset stumbling or dropping things (that appears related to cancer treatments), spinal surgeries, osteoporosis (but is taking medication that can weaken bones).    Limitations House hold activities;Lifting;Standing;Walking;Other (comment)   sudden grabbing stops her in her tracks and she must sit down, stretching, bending, work responsibilities, dressing, petting cats, caring for cats, lifting, moving patients, yardwork, gardening, functional mobility, etc.   Diagnostic tests R hip xray report 08/05/2021: negative. Lumbar xray report 10/06/2021: "IMPRESSION:  Severe lumbar spine scoliosis concave right. Diffuse multilevel  degenerative change. No acute or focal bony abnormality identified." CT abdomen/pelvis report 07/18/2021: "Musculoskeletal: No acute or destructive bony lesions. Left convex  thoracolumbar scoliosis. Reconstructed images demonstrate no  additional findings. IMPRESSION:  1. Diverticulosis of the rectosigmoid colon, with no evidence of  acute diverticulitis. Mild wall thickening of the sigmoid colon  likely reflects scarring from previous bouts of diverticulitis.  2. Normal appendix.  3. Mild hepatic steatosis.  4.  Aortic Atherosclerosis (ICD10-I70.0)."    Currently in Pain? Yes    Pain Score 3    W: 10/10 (tears when grabs); B: 1/10   Pain Location Back    Pain Orientation Right;Left;Mid;Lower  Pain Descriptors / Indicators Aching;Sharp   grabbing   Pain Type Chronic pain    Pain Radiating Towards right LE, sometimes shoots down right leg to ankle. Lateral thigh to front of leg.    Pain Onset More than a month ago    Pain Frequency Constant   leg pain intermittant, back is constant   Aggravating Factors  getting up after prolonged sitting, at night when laying down (sleeps on side especially R), rolling from right to left, sitting with pressure on the right, picking up heavy things.    Pain Relieving Factors sleeping on left side, advil    Effect of  Pain on Daily Activities Functional Limitations: sudden grabbing stops her in her tracks and she must sit down, stretching, bending, work responsibilities, dressing, petting cats, caring for cats, lifting, moving patients, yardwork, gardening, functional mobility.              [facility] PT Assessment - 08/25/21 1044       Assessment   Medical Diagnosis Right-sided low back pain without sciatica, unspecified chronicity    Referring Provider (PT) Arnett, Yvetta Coder, FNP    Onset Date/Surgical Date --   ~ chronic over time as a nurse, worst has been the last couple of months   Hand Dominance Right    Next MD Visit end of month    Prior Therapy for low back pain in 20s after falling down some stairs (helped).      Precautions   Precautions Other (comment)   gradual return to lifting heavier things with the left UE. No other activity restrictions currently.     Balance Screen   Has the patient fallen in the past 6 months No    Has the patient had a decrease in activity level because of a fear of falling?  No    Is the patient reluctant to leave their home because of a fear of falling?  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 Scoliosis with left lumbar convexity noted. Anthropometrics Tremor: none Body composition: BMI: 22.7 Muscle bulk: low throughout body.  Functional Mobility Bed mobility: supine <> sit and rolling mod I for increased time due to  pain Transfers: sit <> Stand mod I due to increased time due to pain.  Gait: mildly antalgic gait favoring R LE immediately upon standing   SPINE MOTION Lumbar Spine AROM *Indicates pain Flexion: fingers to mid shins, really sore an dpain at right low back. Left lumbar rib hump noted.  Extension: 50% pulling in right low back, end range pain (10 repetitions increasing pain,  worse).  Side Flexion:   R proximal patella, increased pain  L distal patella, decreased pain  Rotation:  R WFL L WFL  NEUROLOGICAL  Dermatomes L2-S2 appears equal and intact to light touch. Patient notes that she has loss of sensation at bilateral lateral hips and specific places in each LE  due to neuropathy form chemo.   PERIPHERAL JOINT MOTION (in degrees) Passive Range of Motion (PROM) Patient demo B LE ROM WFL with some evidence of global hypermobility for expected age/gender. Noted for end range pain at lateral right hip and concordant region near R glute with end range R hip flexion, ER, IR.   MUSCLE PERFORMANCE (MMT):  *Indicates pain 08/25/21 Date Date  Joint/Motion R/L R/L R/L  Hip     Flexion (L1, L2) 4+*/4+ / /  Abduction 4/4 / /  Knee     Extension (L3) 5/5 / /  Flexion (S2) 4/4+ / /  Ankle/Foot     Great toe extension (L5) 4/4 / /  Eversion (S1) 5/5 / /  Able to heel and toe walk with B UE support.   SPECIAL TESTS:  HIP SPECIAL TESTS FABER: R = concordant pain at glute (implicates LBP/SIJ pain), L = negative.  SIJ SPECIAL TESTS SIJ distraction: R = negative, L = negative. SIJ compression: R = negative, L = negative. Thigh thrust: R = positive for R sided pain, L = positive for R sided pain. Sacral thrust: R = not tested, L = not tested. (Uncomfortable at chest in prone). Fortin's Sign: pt points to muscle bulk just lateral to R SIJ.   LOWER LIMB NEURODYNAMIC  Straight Leg Raise (Sciatic nerve)  R  = negative for concordant pain, non-symptomatic nerve tension noted.   L  = negative  for concordant pain, non-symptomatic nerve tension noted.   ACCESSORY MOTION:  Prone CPA to L4 and L5 reproduced right glute concordant pain and left upper buttocks pain.   PALPATION: Standing TTP over right SIJ and right L5 paraspinal region.   REPEATED MOTIONS TESTING: Standing repeated extension, 1x10, increasing pain at right glute, worse.    Objective measurements completed on examination: See above findings.      PT Education - 08/25/21 1341     Education Details Education on diagnosis, prognosis, POC, anatomy and physiology of current condition    Person(s) Educated Patient    Methods Explanation;Demonstration;Tactile cues;Verbal cues    Comprehension Verbalized understanding;Returned demonstration;Verbal cues required;Need further instruction;Tactile cues required              PT Short Term Goals - 08/25/21 1715       PT SHORT TERM GOAL #1   Title Be independent with initial home exercise program for self-management of symptoms.    Baseline initial HEP to be provided at visit 2 as appropriate (08/25/2021);    Time 2    Period Weeks    Status New    Target Date 09/08/21               PT Long Term Goals - 08/25/21 1716       PT LONG TERM GOAL #1   Title Be independent with a long-term home exercise program for self-management of symptoms.    Baseline initial HEP to be provided at visit 2 as appropriate (08/25/2021);    Time 12    Period Weeks    Status New   TARGET DATE FOR ALL LONG TERM GOALS: 11/17/2021     PT LONG TERM GOAL #2   Title Demonstrate improved FOTO to equal or greater than 71 by visit #10 to demonstrate improvement in overall condition and self-reported functional ability.    Baseline 54 (08/25/2021);    Time 12    Period Weeks    Status New      PT LONG TERM  GOAL #3   Title Have full lumbar AROM with no compensations outside of those expected from scoliosis or increase in pain in all planes except intermittent end range discomfort to  improve ability  to complete valued activities such bending, carrying, rolling, dressing, etc.    Baseline painful and limited - see objective (08/25/2021);    Time 12    Period Weeks    Status New      PT LONG TERM GOAL #4   Title Improve B hip strength to equal or greater than 4+/5 with no increase in pain to improve patient's ability to complete functional tasks such as working, dressing, walking, yardwork with less difficulty.    Baseline painful and weak - see objective (08/25/2021);    Time 12    Period Weeks    Status New      PT LONG TERM GOAL #5   Title Reduce pain with functional activities to equal or less than 1/10 to allow patient to complete usual activities including housework, walking, bending, sleeping with less difficulty.    Baseline up to 10/10 (08/25/2021);    Time 12    Period Weeks    Status New      PT LONG TERM GOAL #6   Title Complete community, work and/or recreational activities without limitation due to current condition.    Baseline sudden grabbing stops her in her tracks and she must sit down, stretching, bending, work responsibilities, dressing, petting cats, caring for cats, lifting, moving patients, yardwork, gardening, functional mobility, etc (08/25/2021);    Time 12    Period Weeks    Status New                    Plan - 08/25/21 1726     Clinical Impression Statement Patient is a 50 y.o. female referred to outpatient physical therapy with a medical diagnosis of Right-sided low back pain without sciatica, unspecified chronicity who presents with signs and symptoms consistent with chronic right sided low back pain with intermittent sciatica down R LE. Unclear if pain is primarily from SIJ or lowest lumbar segments, which share a referral pattern. SIJ stress testing mostly negative but location and description of pain resemble SIJ pain. Nether region can be ruled out at this point. Patient did report concordant symptoms at right glute with CPA  to L5, which suggests lumbar contribution. Patient has been in treatment for cancer for the last year and has lost a lot of strength and become much less active. This is likely contributing to her pain and she would benefit from interventions to improved strength, lumbopelvic stability, and functional activity tolerance.  Patient presents with significant pain, posture, motor control, balance, muscle performance (strength/power/endurance) and activity tolerance impairments that are limiting ability to complete her usual activities such as bending, work responsibilities, dressing, petting cats, caring for cats, lifting, moving patients, yardwork, gardening, functional mobility, or anything except sitting (when sudden grabbling pain occurs) without difficulty and it decreases her quality of life. Patient will benefit from skilled physical therapy intervention to address current body structure impairments and activity limitations to improve function and work towards goals set in current POC in order to return to prior level of function or maximal functional improvement.    Personal Factors and Comorbidities Comorbidity 3+;Past/Current Experience;Profession;Fitness;Social Background;Time since onset of injury/illness/exacerbation    Comorbidities Relevant past medical history and comorbidities include L breast cancer stage IIIb (dx Nov 2021. ER+/PR+ HER2-, underwent a bilateral mastectomy and ALND on  L (7/16) on 03/17/21, reconstruction in summer 2022, neuropathy of hands, lower legs and feet, completed chemo, completed radiation Dec17, 2021 to February 18, 2021, currently taking oral medication and infusions), scoliosis, internal hemorrhoids, atherosclerosis of aorta, hepatic steatosis, chemotherapy -induced neuropathy, anxiety/depression, chronic insomnia, arthralgia, B12 deficiency, GERD, IBS, diverticulitis, current smoker (trying to quit), UTIs, abdominal hysterectomy    Examination-Activity Limitations Bed  Mobility;Lift;Squat;Bend;Locomotion Level;Stand;Caring for Others;Carry;Transfers;Sleep;Dressing    Examination-Participation Restrictions Laundry;Shop;Cleaning;Community Activity;Meal Prep;Occupation;Yard Work;Interpersonal Relationship;Other   sudden grabbing stops her in her tracks and she must sit down, stretching, bending, work responsibilities, dressing, petting cats, caring for cats, lifting, moving patients, yardwork, gardening, functional mobility, etc.   Stability/Clinical Decision Making Evolving/Moderate complexity    Clinical Decision Making Moderate    Rehab Potential Good    PT Frequency 2x / week    PT Duration 12 weeks    PT Treatment/Interventions ADLs/Self Care Home Management;Aquatic Therapy;Cryotherapy;Gait training;Stair training;Functional mobility training;Therapeutic activities;Therapeutic exercise;Balance training;Neuromuscular re-education;Manual techniques;Dry needling;Energy conservation;Patient/family education    PT Next Visit Plan establish initial HEP, core/functional/LE strengthening as tolerated    PT Home Exercise Plan TBD    Consulted and Agree with Plan of Care Patient             Patient will benefit from skilled therapeutic intervention in order to improve the following deficits and impairments:  Abnormal gait, Impaired sensation, Improper body mechanics, Pain, Postural dysfunction, Hypermobility, Decreased activity tolerance, Decreased endurance, Decreased range of motion, Decreased strength, Impaired perceived functional ability, Difficulty walking, Decreased balance  Visit Diagnosis: Chronic right-sided low back pain, unspecified whether sciatica present  Sacrococcygeal disorders, not elsewhere classified  Muscle weakness (generalized)  Difficulty in walking, not elsewhere classified     Problem List Patient Active Problem List   Diagnosis Date Noted   Acquired absence of breast 08/16/2021   Dysuria 08/05/2021   B12 deficiency 07/21/2021    Atherosclerosis of aorta ([place]) 07/20/2021   Hepatic steatosis 07/20/2021   Abdominal pain 07/18/2021   Breast cancer ([place]) 03/17/2021   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[place]) 03/14/2021   Genetic testing 11/22/2020   Family history of ovarian cancer    Candidal vulvovaginitis 11/01/2020   Low back pain 10/06/2020   Encounter for medical examination to establish care 09/26/2020   Malignant neoplasm of upper-outer quadrant of left breast in female, estrogen receptor positive ([place]) 09/26/2020   BACK STRAIN, LUMBAR 03/08/2010   INTERNAL HEMORRHOIDS 01/27/2010   ANAL FISSURE 01/27/2010   OSTEOARTHRITIS, HANDS, BILATERAL 01/04/2010   ARTHRALGIA 10/05/2009   INSOMNIA, CHRONIC 05/06/2009   ALLERGIC RHINITIS, SEASONAL 02/09/2009   TOBACCO ABUSE 12/29/2008   IBS 10/13/2008   Anxiety state 08/25/2008   DEPRESSION, RECURRENT 08/25/2008    Everlean Alstrom. Graylon Good, PT, DPT 08/25/21, 5:29 PM   [facility] [address] [place], [address] Phone: [phone]   Fax:  [phone]  Name: Tricia Berry MRN: [id number] Date of Birth: 05-09-1971

## 2021-08-26 ENCOUNTER — Other Ambulatory Visit: Payer: Self-pay | Admitting: Oncology

## 2021-08-26 ENCOUNTER — Other Ambulatory Visit: Payer: Self-pay

## 2021-08-26 ENCOUNTER — Ambulatory Visit: Payer: No Typology Code available for payment source

## 2021-08-26 ENCOUNTER — Encounter: Payer: Self-pay | Admitting: Oncology

## 2021-08-26 ENCOUNTER — Inpatient Hospital Stay: Payer: No Typology Code available for payment source | Attending: Oncology

## 2021-08-26 DIAGNOSIS — Z5112 Encounter for antineoplastic immunotherapy: Secondary | ICD-10-CM | POA: Diagnosis present

## 2021-08-26 DIAGNOSIS — C50412 Malignant neoplasm of upper-outer quadrant of left female breast: Secondary | ICD-10-CM

## 2021-08-26 DIAGNOSIS — Z5111 Encounter for antineoplastic chemotherapy: Secondary | ICD-10-CM | POA: Diagnosis not present

## 2021-08-26 DIAGNOSIS — Z79899 Other long term (current) drug therapy: Secondary | ICD-10-CM | POA: Insufficient documentation

## 2021-08-26 MED ORDER — GOSERELIN ACETATE 3.6 MG ~~LOC~~ IMPL
3.6000 mg | DRUG_IMPLANT | Freq: Once | SUBCUTANEOUS | Status: AC
  Administered 2021-08-26: 3.6 mg via SUBCUTANEOUS
  Filled 2021-08-26: qty 3.6

## 2021-08-26 MED ORDER — OXYCODONE HCL 10 MG PO TABS
10.0000 mg | ORAL_TABLET | Freq: Four times a day (QID) | ORAL | 0 refills | Status: DC | PRN
  Filled 2021-08-26: qty 120, 30d supply, fill #0

## 2021-08-29 ENCOUNTER — Ambulatory Visit: Payer: No Typology Code available for payment source | Admitting: Physical Therapy

## 2021-08-29 ENCOUNTER — Encounter: Payer: Self-pay | Admitting: Physical Therapy

## 2021-08-29 ENCOUNTER — Other Ambulatory Visit: Payer: Self-pay

## 2021-08-29 ENCOUNTER — Ambulatory Visit: Payer: No Typology Code available for payment source

## 2021-08-29 DIAGNOSIS — R293 Abnormal posture: Secondary | ICD-10-CM

## 2021-08-29 DIAGNOSIS — M25612 Stiffness of left shoulder, not elsewhere classified: Secondary | ICD-10-CM | POA: Diagnosis not present

## 2021-08-29 DIAGNOSIS — Z483 Aftercare following surgery for neoplasm: Secondary | ICD-10-CM

## 2021-08-29 DIAGNOSIS — C50912 Malignant neoplasm of unspecified site of left female breast: Secondary | ICD-10-CM

## 2021-08-29 NOTE — Therapy (Signed)
[facility] [place], [place], [address] Phone: [phone]   Fax:  [phone]  Physical Therapy Treatment  Patient Details  Name: Tricia Berry MRN: [id number] Date of Birth: 1971/09/23 Referring Provider (PT): Burnard Hawthorne, FNP   Encounter Date: 08/29/2021   PT End of Session - 08/29/21 1057     Visit Number 11    Number of Visits 12    Date for PT Re-Evaluation 09/12/21    PT Start Time 1005    PT Stop Time 1051    PT Time Calculation (min) 46 min    Activity Tolerance Patient tolerated treatment well    Behavior During Therapy [facility] for tasks assessed/performed             Past Medical History:  Diagnosis Date   Anxiety    Breast cancer ([place])    Diverticulitis    Family history of ovarian cancer    GERD (gastroesophageal reflux disease)    IBS (irritable bowel syndrome)    PONV (postoperative nausea and vomiting)    severe migraine and vomiting post anesthesia   Scoliosis     Past Surgical History:  Procedure Laterality Date   ABDOMINAL HYSTERECTOMY     still has ovaries, no gyn cancer, hysterectomy due to endometriosis. NO cervix on exam 01/10/21   BREAST BIOPSY Left 09/15/2020   [place] bx of mass, path pending, Q marker   BREAST BIOPSY Left 09/15/2020   [place] bx of LN, hydromarker, path pending   BREAST RECONSTRUCTION WITH PLACEMENT OF TISSUE EXPANDER AND FLEX HD (ACELLULAR HYDRATED DERMIS) Bilateral 03/17/2021   Procedure: IMMEDIATE BILATERAL BREAST RECONSTRUCTION WITH PLACEMENT OF TISSUE EXPANDER AND FLEX HD (ACELLULAR HYDRATED DERMIS);  Surgeon: Wallace Going, DO;  Location: [facility];  Service: Plastics;  Laterality: Bilateral;   IR IMAGING GUIDED PORT INSERTION  10/01/2020   MODIFIED MASTECTOMY Left 03/17/2021   Procedure: LEFT MODIFIED RADICAL MASTECTOMY;  Surgeon: Erroll Luna, MD;  Location: [facility];  Service: General;  Laterality:  Left;   PORTA CATH REMOVAL Right 03/17/2021   Procedure: PORTA CATH REMOVAL;  Surgeon: Erroll Luna, MD;  Location: [facility];  Service: General;  Laterality: Right;   REMOVAL OF BILATERAL TISSUE EXPANDERS WITH PLACEMENT OF BILATERAL BREAST IMPLANTS Bilateral 05/09/2021   Procedure: REMOVAL OF BILATERAL TISSUE EXPANDERS WITH PLACEMENT OF BILATERAL BREAST IMPLANTS;  Surgeon: Wallace Going, DO;  Location: [facility];  Service: Plastics;  Laterality: Bilateral;  90 min   TOTAL MASTECTOMY Right 03/17/2021   Procedure: TOTAL MASTECTOMY;  Surgeon: Erroll Luna, MD;  Location: [facility];  Service: General;  Laterality: Right;    There were no vitals filed for this visit.   Subjective Assessment - 08/29/21 1013     Subjective I had a lot of pain after PT last week. I did not do a lot of stretching over the weekend because I felt tired.    Pertinent History L breast cancer ER+/PR+ HER2-, stage III, underwent a bilateral mastectomy and ALND on L (7/16) on 03/17/21, reconstruction in summer 2022, neuropathy of hands, lower legs and feet, completed radiation, completed radiation Dec17, 2021 to February 18, 2021    Patient Stated Goals to gain info from providers    Currently in Pain? No/denies    Pain Score 0-No pain  [facility] Adult PT Treatment/Exercise - 08/29/21 0001       Manual Therapy   Soft tissue mobilization to L pec using cocoa butter with UE in various degrees of flexion and abduction    Myofascial Release to cording in L axilla with cording less palpable today than last session- used cocoa butter    Passive ROM to L shoulder in direction of flexion, abduction and ER                       PT Short Term Goals - 08/25/21 1715       PT SHORT TERM GOAL #1   Title Be independent with initial home exercise program for self-management of symptoms.    Baseline initial HEP to be  provided at visit 2 as appropriate (08/25/2021);    Time 2    Period Weeks    Status New    Target Date 09/08/21               PT Long Term Goals - 08/29/21 1055       PT LONG TERM GOAL #1   Title Pt will return to baseline shoulder ROM measurements and not demonstrate any signs or symptoms of lymphedema.    Baseline 05/31/21- pt has 180 degrees of flex and abd bilaterally    Time 4    Period Weeks    Status Achieved      PT LONG TERM GOAL #2   Title Pt will demonstrate 165 degrees of bilateral flexion to allow pt to reach overhead.    Baseline R 143 L 101; 05/02/21- 172 on R, 175 on L    Time 4    Period Weeks    Status Achieved      PT LONG TERM GOAL #3   Title Pt will demonstrate 165 degrees of bilateral shoulder abduction to allow her to reach out the side    Baseline R 152 L 78; 05/02/21- R 180 L 179    Time 4    Period Weeks    Status Achieved      PT LONG TERM GOAL #4   Title Pt will report she is no longer having muscle spasms across her chest to allow improved comfort    Baseline 05/02/21- pt reports she is no longer having muscle spasms    Time 4    Period Weeks    Status Achieved      PT LONG TERM GOAL #5   Title Pt will be independent in a home exercise program for long term strengthening and stretching.    Time 4    Period Weeks    Status Achieved      PT LONG TERM GOAL #6   Title Pt will report a 50% improvement in edema in L trunk and chest to allow improved comfort.    Baseline 05/02/21- 20% improved; 05/31/21 no edema present    Time 4    Period Weeks    Status Achieved      PT LONG TERM GOAL #7   Title Pt will be independent in Strength ABC program for long term stretching and strengthening after completion of radiation.    Time 10    Period Weeks    Status On-going      PT LONG TERM GOAL #8   Title Pt will demonstrate baseline ROM (180 degrees) of flexion and abduction of LUE to allow pt to return to prior level of function.  Baseline 160  flex, 164 abd    Time 4    Period Weeks    Status New                   Plan - 08/29/21 1059     Clinical Impression Statement Pt reports she had increased pain after last session. The pain was in the area of her pec tendon. She says she guarded it over the weekend and did not stretch and realized that was not the correct thing to do. Spent session focusing on manual techniques to decrease pec tightness and improve L shoulder ROM. Pt unable to lift arm fully at beginning of session. Cording was less palpable today. At one point during [place] to cording pt felt pain just inferior to L clavicle across upper chest that she reported as burning pain. There was no contact to made to this area so the pain was referred from her axilla where therapist was performing Sarah Ann. Unable to reproduce this during the rest of the session. By end of session pt demonstrated full L shoulder ROM. Encouraged pt to continue stretching at home.    PT Frequency 1x / week    PT Duration 6 weeks    PT Treatment/Interventions ADLs/Self Care Home Management;Therapeutic exercise;Patient/family education;Scar mobilization;Passive range of motion;Manual techniques;Manual lymph drainage;Compression bandaging;Taping;Vasopneumatic Device    PT Next Visit Plan MFR to L axilla in area of cording instruct in Strength ABC program; Cont every 3 month L-Dex screens for up to 2 years from her SLNB    PT Home Exercise Plan post op breast exercises, instructed pt in supine dowel, supine scap series    Consulted and Agree with Plan of Care Patient             Patient will benefit from skilled therapeutic intervention in order to improve the following deficits and impairments:  Pain, Postural dysfunction, Decreased knowledge of precautions, Impaired UE functional use, Increased fascial restricitons, Decreased strength, Decreased range of motion, Decreased scar mobility, Increased edema, Increased muscle spasms  Visit  Diagnosis: Stiffness of left shoulder, not elsewhere classified  Aftercare following surgery for neoplasm  Abnormal posture  Malignant 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, estrogen receptor positive, unspecified site of breast [facility])     Problem List Patient Active Problem List   Diagnosis Date Noted   Acquired absence of breast 08/16/2021   Dysuria 08/05/2021   B12 deficiency 07/21/2021   Atherosclerosis of aorta ([place]) 07/20/2021   Hepatic steatosis 07/20/2021   Abdominal pain 07/18/2021   Breast cancer ([place]) 03/17/2021   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[place]) 03/14/2021   Genetic testing 11/22/2020   Family history of ovarian cancer    Candidal vulvovaginitis 11/01/2020   Low back pain 10/06/2020   Encounter for medical examination to establish care 09/26/2020   Malignant neoplasm of upper-outer quadrant of left breast in female, estrogen receptor positive ([place]) 09/26/2020   BACK STRAIN, LUMBAR 03/08/2010   INTERNAL HEMORRHOIDS 01/27/2010   ANAL FISSURE 01/27/2010   OSTEOARTHRITIS, HANDS, BILATERAL 01/04/2010   ARTHRALGIA 10/05/2009   INSOMNIA, CHRONIC 05/06/2009   ALLERGIC RHINITIS, SEASONAL 02/09/2009   TOBACCO ABUSE 12/29/2008   IBS 10/13/2008   Anxiety state 08/25/2008   DEPRESSION, RECURRENT 08/25/2008    Manus Gunning, PT 08/29/2021, 11:03 AM  [facility] @ [place], [place], [address] Phone: [phone]   Fax:  [phone]  Name: AVERILL WINTERS MRN: [id number] Date of Birth: February 19, 1971  Allyson Sabal [place], [place] 08/29/21 11:03 AM

## 2021-08-30 ENCOUNTER — Ambulatory Visit: Payer: No Typology Code available for payment source | Admitting: Physical Therapy

## 2021-08-30 ENCOUNTER — Encounter: Payer: Self-pay | Admitting: Physical Therapy

## 2021-08-30 DIAGNOSIS — M6281 Muscle weakness (generalized): Secondary | ICD-10-CM

## 2021-08-30 DIAGNOSIS — M545 Low back pain, unspecified: Secondary | ICD-10-CM

## 2021-08-30 DIAGNOSIS — R262 Difficulty in walking, not elsewhere classified: Secondary | ICD-10-CM

## 2021-08-30 DIAGNOSIS — G8929 Other chronic pain: Secondary | ICD-10-CM

## 2021-08-30 DIAGNOSIS — M533 Sacrococcygeal disorders, not elsewhere classified: Secondary | ICD-10-CM

## 2021-08-30 NOTE — Therapy (Signed)
[facility] [address] [place], [address] Phone: [phone]   Fax:  [phone]  Physical Therapy Treatment  Patient Details  Name: Tricia Berry MRN: [id number] Date of Birth: [birth year] Referring Provider (PT): Burnard Hawthorne, FNP   Encounter Date: 08/30/2021   PT End of Session - 08/30/21 1050     Visit Number 2    Number of Visits 24    Date for PT Re-Evaluation 11/17/21    Authorization Type [place] [employer] reporting period from 08/25/2021    Progress Note Due on Visit 10    PT Start Time 1040    PT Stop Time 1120    PT Time Calculation (min) 40 min    Activity Tolerance Patient tolerated treatment well    Behavior During Therapy [facility] for tasks assessed/performed             Past Medical History:  Diagnosis Date   Anxiety    Breast cancer ([place])    Diverticulitis    Family history of ovarian cancer    GERD (gastroesophageal reflux disease)    IBS (irritable bowel syndrome)    PONV (postoperative nausea and vomiting)    severe migraine and vomiting post anesthesia   Scoliosis     Past Surgical History:  Procedure Laterality Date   ABDOMINAL HYSTERECTOMY     still has ovaries, no gyn cancer, hysterectomy due to endometriosis. NO cervix on exam 01/10/21   BREAST BIOPSY Left 09/15/2020   [place] bx of mass, path pending, Q marker   BREAST BIOPSY Left 09/15/2020   [place] bx of LN, hydromarker, path pending   BREAST RECONSTRUCTION WITH PLACEMENT OF TISSUE EXPANDER AND FLEX HD (ACELLULAR HYDRATED DERMIS) Bilateral 03/17/2021   Procedure: IMMEDIATE BILATERAL BREAST RECONSTRUCTION WITH PLACEMENT OF TISSUE EXPANDER AND FLEX HD (ACELLULAR HYDRATED DERMIS);  Surgeon: Wallace Going, DO;  Location: [place] [facility];  Service: Plastics;  Laterality: Bilateral;   IR IMAGING GUIDED PORT INSERTION  10/01/2020   MODIFIED MASTECTOMY Left 03/17/2021   Procedure: LEFT MODIFIED RADICAL  MASTECTOMY;  Surgeon: Erroll Luna, MD;  Location: [facility];  Service: General;  Laterality: Left;   PORTA CATH REMOVAL Right 03/17/2021   Procedure: PORTA CATH REMOVAL;  Surgeon: Erroll Luna, MD;  Location: [facility];  Service: General;  Laterality: Right;   REMOVAL OF BILATERAL TISSUE EXPANDERS WITH PLACEMENT OF BILATERAL BREAST IMPLANTS Bilateral 05/09/2021   Procedure: REMOVAL OF BILATERAL TISSUE EXPANDERS WITH PLACEMENT OF BILATERAL BREAST IMPLANTS;  Surgeon: Wallace Going, DO;  Location: [facility];  Service: Plastics;  Laterality: Bilateral;  90 min   TOTAL MASTECTOMY Right 03/17/2021   Procedure: TOTAL MASTECTOMY;  Surgeon: Erroll Luna, MD;  Location: [facility];  Service: General;  Laterality: Right;    There were no vitals filed for this visit.   Subjective Assessment - 08/30/21 1046     Subjective Patient reports she felt okay after her last PT session for low back pain but she woke up sore in her right hip today for no apparent reason. Wonders if she slept wrong. Rates her hip pain at 4/10 throbbing pain while sitting in waiting room. States it was tolerable.    Pertinent History Patient is a 50 y.o. female who presents to outpatient physical therapy with a referral for medical diagnosis Right-sided low back pain without sciatica, unspecified chronicity. This patient's chief complaints consist of worsening chronic R  sided low back pain that intermittently radiates down the leg and across the back to the left side leading to the following functional deficits: sudden grabbing stops her in her tracks and she must sit down, stretching, bending, work responsibilities, dressing, petting cats, caring for cats, lifting, moving patients, yardwork, gardening, functional mobility, etc.  Relevant past medical history and comorbidities include L breast cancer stage IIIb (dx Nov 2021. ER+/PR+ HER2-, underwent a bilateral  mastectomy and ALND on L (7/16) on 03/17/21, reconstruction in summer 2022, neuropathy of hands, lower legs and feet, completed chemo, completed radiation Dec17, 2021 to February 18, 2021, currently taking oral medication and infusions), scoliosis, internal hemorrhoids, atherosclerosis of aorta, hepatic steatosis, chemotherapy -induced neuropathy, anxiety/depression, chronic insomnia, arthralgia, B12 deficiency, GERD, IBS, diverticulitis, current smoker (trying to quit), UTIs, abdominal hysterectomy.Patient denies hx of  stroke, seizures, lung problem, major cardiac events, diabetes, changes in bowel or bladder problems, new onset stumbling or dropping things (that appears related to cancer treatments), spinal surgeries, osteoporosis (but is taking medication that can weaken bones).    Limitations House hold activities;Lifting;Standing;Walking;Other (comment)   sudden grabbing stops her in her tracks and she must sit down, stretching, bending, work responsibilities, dressing, petting cats, caring for cats, lifting, moving patients, yardwork, gardening, functional mobility, etc.   Diagnostic tests R hip xray report 08/05/2021: negative. Lumbar xray report 10/06/2021: "IMPRESSION:  Severe lumbar spine scoliosis concave right. Diffuse multilevel  degenerative change. No acute or focal bony abnormality identified." CT abdomen/pelvis report 07/18/2021: "Musculoskeletal: No acute or destructive bony lesions. Left convex  thoracolumbar scoliosis. Reconstructed images demonstrate no  additional findings. IMPRESSION:  1. Diverticulosis of the rectosigmoid colon, with no evidence of  acute diverticulitis. Mild wall thickening of the sigmoid colon  likely reflects scarring from previous bouts of diverticulitis.  2. Normal appendix.  3. Mild hepatic steatosis.  4.  Aortic Atherosclerosis (ICD10-I70.0)."    Currently in Pain? Yes    Pain Score 4                TREATMENT:   Therapeutic exercise: to centralize symptoms  and improve ROM, strength, muscular endurance, and activity tolerance required for successful completion of functional activities.  - NuStep level 3 using bilateral lower extremities. Seat setting 4 For improved extremity mobility, muscular endurance, and activity tolerance; and to induce the analgesic effect of aerobic exercise, stimulate improved joint nutrition, and prepare body structures and systems for following interventions. x 4:40 minutes. Average SPM = 58 - hooklying pelvic tilt AROM anterior/posterior, 1x20 - hooklying posterior pelvic tilt (PPT), 5 second/1 breath hold, 1x20 - hooklying (PPT) with marching, 2x10 each side.  - hooklying clamshell with yellow theraband around knees, 1x20 with 5 second hold.   Pt required multimodal cuing for proper technique and to facilitate improved neuromuscular control, strength, range of motion, and functional ability resulting in improved performance and form.  HOME EXERCISE PROGRAM Access Code: [email or web address] URL: [email or web address] Date: 08/30/2021 Prepared by: Rosita Kea  Exercises Supine Posterior Pelvic Tilt - 1 x daily - 1 sets - 20 reps - 1 breath/5 seconds hold Supine March with Posterior Pelvic Tilt - 1 x daily - 2 sets - 10 reps Hooklying Clamshell with Resistance - 1 x daily - 1 sets - 20 reps - 5 seconds hold Bridge - 1 x daily - 1 sets - 20 reps - 5 seconds hold    PT Education - 08/30/21 1050     Education Details Exercise purpose/form. Self management techniques.  Person(s) Educated Patient    Methods Explanation;Demonstration;Tactile cues;Verbal cues    Comprehension Verbalized understanding;Returned demonstration;Verbal cues required;Tactile cues required;Need further instruction              PT Short Term Goals - 08/30/21 1104       PT SHORT TERM GOAL #1   Title Be independent with initial home exercise program for self-management of symptoms.    Baseline initial HEP to be provided at visit 2 as  appropriate (08/25/2021); initial HEP provided at visit 2 (08/30/2021);    Time 2    Period Weeks    Status Partially Met    Target Date 09/08/21               PT Long Term Goals - 08/30/21 1101       PT LONG TERM GOAL #1   Title Be independent with a long-term home exercise program for self-management of symptoms.    Baseline initial HEP to be provided at visit 2 as appropriate (08/25/2021); initial HEP provided on visit 2 (08/30/2021);    Time 12    Period Weeks    Status On-going   TARGET DATE FOR ALL LONG TERM GOALS: 11/17/2021     PT LONG TERM GOAL #2   Title Demonstrate improved FOTO to equal or greater than 71 by visit #10 to demonstrate improvement in overall condition and self-reported functional ability.    Baseline 54 (08/25/2021);    Time 12    Period Weeks    Status On-going      PT LONG TERM GOAL #3   Title Have full lumbar AROM with no compensations outside of those expected from scoliosis or increase in pain in all planes except intermittent end range discomfort to improve ability  to complete valued activities such bending, carrying, rolling, dressing, etc.    Baseline painful and limited - see objective (08/25/2021);    Time 12    Period Weeks    Status On-going      PT LONG TERM GOAL #4   Title Improve B hip strength to equal or greater than 4+/5 with no increase in pain to improve patient's ability to complete functional tasks such as working, dressing, walking, yardwork with less difficulty.    Baseline painful and weak - see objective (08/25/2021);    Time 12    Period Weeks    Status On-going      PT LONG TERM GOAL #5   Title Reduce pain with functional activities to equal or less than 1/10 to allow patient to complete usual activities including housework, walking, bending, sleeping with less difficulty.    Baseline up to 10/10 (08/25/2021);    Time 12    Period Weeks    Status On-going      PT LONG TERM GOAL #6   Title Complete community, work and/or  recreational activities without limitation due to current condition.    Baseline sudden grabbing stops her in her tracks and she must sit down, stretching, bending, work responsibilities, dressing, petting cats, caring for cats, lifting, moving patients, yardwork, gardening, functional mobility, etc (08/25/2021);    Time 12    Period Weeks    Status On-going                   Plan - 08/30/21 1051     Clinical Impression Statement Patient tolerated well overall but fatigues quickly. Introduced lumbar stabilization and hip/core strengthening exercises with breathing to work towards improved activity tolerance  and pain control with functional activity. Provided initial HEP. Patient would benefit from continued management of limiting condition by skilled physical therapist to address remaining impairments and functional limitations to work towards stated goals and return to PLOF or maximal functional independence.    Personal Factors and Comorbidities Comorbidity 3+;Past/Current Experience;Profession;Fitness;Social Background;Time since onset of injury/illness/exacerbation    Comorbidities Relevant past medical history and comorbidities include L breast cancer stage IIIb (dx Nov 2021. ER+/PR+ HER2-, underwent a bilateral mastectomy and ALND on L (7/16) on 03/17/21, reconstruction in summer 2022, neuropathy of hands, lower legs and feet, completed chemo, completed radiation Dec17, 2021 to February 18, 2021, currently taking oral medication and infusions), scoliosis, internal hemorrhoids, atherosclerosis of aorta, hepatic steatosis, chemotherapy -induced neuropathy, anxiety/depression, chronic insomnia, arthralgia, B12 deficiency, GERD, IBS, diverticulitis, current smoker (trying to quit), UTIs, abdominal hysterectomy    Examination-Activity Limitations Bed Mobility;Lift;Squat;Bend;Locomotion Level;Stand;Caring for Others;Carry;Transfers;Sleep;Dressing    Examination-Participation Restrictions  Laundry;Shop;Cleaning;Community Activity;Meal Prep;Occupation;Yard Work;Interpersonal Relationship;Other   sudden grabbing stops her in her tracks and she must sit down, stretching, bending, work responsibilities, dressing, petting cats, caring for cats, lifting, moving patients, yardwork, gardening, functional mobility, etc.   Stability/Clinical Decision Making Evolving/Moderate complexity    Rehab Potential Good    PT Frequency 2x / week    PT Duration 12 weeks    PT Treatment/Interventions ADLs/Self Care Home Management;Aquatic Therapy;Cryotherapy;Gait training;Stair training;Functional mobility training;Therapeutic activities;Therapeutic exercise;Balance training;Neuromuscular re-education;Manual techniques;Dry needling;Energy conservation;Patient/family education    PT Next Visit Plan core/functional/LE strengthening as tolerated    PT Home Exercise Plan Medbridge Access Code: [email or web address]    Consulted and Agree with Plan of Care Patient             Patient will benefit from skilled therapeutic intervention in order to improve the following deficits and impairments:  Abnormal gait, Impaired sensation, Improper body mechanics, Pain, Postural dysfunction, Hypermobility, Decreased activity tolerance, Decreased endurance, Decreased range of motion, Decreased strength, Impaired perceived functional ability, Difficulty walking, Decreased balance  Visit Diagnosis: Chronic right-sided low back pain, unspecified whether sciatica present  Sacrococcygeal disorders, not elsewhere classified  Muscle weakness (generalized)  Difficulty in walking, not elsewhere classified     Problem List Patient Active Problem List   Diagnosis Date Noted   Acquired absence of breast 08/16/2021   Dysuria 08/05/2021   B12 deficiency 07/21/2021   Atherosclerosis of aorta ([place]) 07/20/2021   Hepatic steatosis 07/20/2021   Abdominal pain 07/18/2021   Breast cancer ([place]) 03/17/2021   Chemotherapy-induced  neuropathy ([place]) 03/14/2021   Genetic testing 11/22/2020   Family history of ovarian cancer    Candidal vulvovaginitis 11/01/2020   Low back pain 10/06/2020   Encounter for medical examination to establish care 09/26/2020   Malignant neoplasm of upper-outer quadrant of left breast in female, estrogen receptor positive ([place]) 09/26/2020   BACK STRAIN, LUMBAR 03/08/2010   INTERNAL HEMORRHOIDS 01/27/2010   ANAL FISSURE 01/27/2010   OSTEOARTHRITIS, HANDS, BILATERAL 01/04/2010   ARTHRALGIA 10/05/2009   INSOMNIA, CHRONIC 05/06/2009   ALLERGIC RHINITIS, SEASONAL 02/09/2009   TOBACCO ABUSE 12/29/2008   IBS 10/13/2008   Anxiety state 08/25/2008   DEPRESSION, RECURRENT 08/25/2008    Everlean Alstrom. Graylon Good, PT, DPT 08/30/21, 11:26 AM  [facility] [address] [place], [address] Phone: [phone]   Fax:  [phone]  Name: Tricia Berry MRN: [id number] Date of Birth: [birth year]

## 2021-09-01 ENCOUNTER — Ambulatory Visit
Admission: RE | Admit: 2021-09-01 | Discharge: 2021-09-01 | Disposition: A | Discharge status: Home / Self Care | Payer: No Typology Code available for payment source | Source: Ambulatory Visit | Attending: Radiation Oncology | Admitting: Radiation Oncology

## 2021-09-01 ENCOUNTER — Encounter: Payer: Self-pay | Admitting: Physical Therapy

## 2021-09-01 ENCOUNTER — Ambulatory Visit: Payer: No Typology Code available for payment source | Admitting: Physical Therapy

## 2021-09-01 ENCOUNTER — Other Ambulatory Visit: Payer: Self-pay

## 2021-09-01 VITALS — BP 118/80 | HR 87 | Temp 97.8°F | Resp 16 | Wt 113.5 lb

## 2021-09-01 DIAGNOSIS — Z9221 Personal history of antineoplastic chemotherapy: Secondary | ICD-10-CM | POA: Insufficient documentation

## 2021-09-01 DIAGNOSIS — G8929 Other chronic pain: Secondary | ICD-10-CM

## 2021-09-01 DIAGNOSIS — Z923 Personal history of irradiation: Secondary | ICD-10-CM | POA: Diagnosis not present

## 2021-09-01 DIAGNOSIS — Z79811 Long term (current) use of aromatase inhibitors: Secondary | ICD-10-CM | POA: Insufficient documentation

## 2021-09-01 DIAGNOSIS — C50412 Malignant neoplasm of upper-outer quadrant of left female breast: Secondary | ICD-10-CM | POA: Insufficient documentation

## 2021-09-01 DIAGNOSIS — R262 Difficulty in walking, not elsewhere classified: Secondary | ICD-10-CM

## 2021-09-01 DIAGNOSIS — M6281 Muscle weakness (generalized): Secondary | ICD-10-CM

## 2021-09-01 DIAGNOSIS — Z170 Estrogen receptor positive status [ER+]: Secondary | ICD-10-CM | POA: Diagnosis not present

## 2021-09-01 DIAGNOSIS — M545 Low back pain, unspecified: Secondary | ICD-10-CM

## 2021-09-01 DIAGNOSIS — M533 Sacrococcygeal disorders, not elsewhere classified: Secondary | ICD-10-CM

## 2021-09-01 NOTE — Progress Notes (Signed)
Radiation Oncology Follow up Note  Name: Tricia Berry   Date:   09/01/2021 MRN:  [id number] DOB: [birth year]    This 50 y.o. 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  REFERRING PROVIDER: Burnard Hawthorne, FNP  HPI: Patient is a 50 year old female now out to 1 month having completed whole breast radiation to her left breast which has been reconstructed.  She had stage III triple positive invasive mammary carcinoma.  She had neoadjuvant chemotherapy followed by bilateral mastectomies.  She is seen today in routine follow-up 1 month out is doing well she is scheduled for later this year to have permanent breast reconstruction.  She specifically denies breast tenderness cough or bone pain..  She has been started on Arimidex tolerating it well without side effects.  COMPLICATIONS OF TREATMENT: none  FOLLOW UP COMPLIANCE: keeps appointments   PHYSICAL EXAM:  BP 118/80   Pulse 87   Temp 97.8 F (36.6 C) (Tympanic)   Resp 16   Wt 113 lb 8 oz (51.5 kg)   BMI 22.17 kg/m  Patient has reconstruction of the left and right breast.  No evidence of mass or nodularity is noted in either breast.  No axillary or supraclavicular adenopathy is noted no evidence of lymphedema in the upper extremities is noted.  Well-developed well-nourished patient in NAD. HEENT reveals PERLA, EOMI, discs not visualized.  Oral cavity is clear. No oral mucosal lesions are identified. Neck is clear without evidence of cervical or supraclavicular adenopathy. Lungs are clear to A&P. Cardiac examination is essentially unremarkable with regular rate and rhythm without murmur rub or thrill. Abdomen is benign with no organomegaly or masses noted.  Motor sensory and DTR levels are equal and symmetric in the upper and lower extremities. Cranial nerves II through XII are grossly intact. Proprioception is intact. No peripheral adenopathy or edema is identified. No motor or sensory levels are noted. Crude visual fields are within normal range.  RADIOLOGY RESULTS: No current films for review  PLAN: Present time patient is doing well 1 month out from whole breast radiation and pleased with her overall progress.  Of asked to see her back in 4 to 5 months for follow-up.  Patient knows to call with any concerns.  She continues close follow-up care with medical oncology.  I would like to take this opportunity to thank you for allowing me to participate in the care of your patient.Noreene Filbert, MD

## 2021-09-01 NOTE — Therapy (Signed)
[facility] [address], [place], [address] Phone: [phone]   Fax:  [phone]  Physical Therapy Treatment  Patient Details  Name: LOAN OGUIN MRN: [id number] Date of Birth: [birth year] Referring Provider (PT): Burnard Hawthorne, FNP   Encounter Date: 09/01/2021   PT End of Session - 09/01/21 1125     Visit Number 3    Number of Visits 24    Date for PT Re-Evaluation 11/17/21    Authorization Type [place] [employer]OC[employer] reporting period from 08/25/2021    Progress Note Due on Visit 10    PT Start Time 1120    PT Stop Time 1200    PT Time Calculation (min) 40 min    Activity Tolerance Patient tolerated treatment well    Behavior During Therapy [facility] for tasks assessed/performed             Past Medical History:  Diagnosis Date   Anxiety    Breast cancer ([place])    Diverticulitis    Family history of ovarian cancer    GERD (gastroesophageal reflux disease)    IBS (irritable bowel syndrome)    PONV (postoperative nausea and vomiting)    severe migraine and vomiting post anesthesia   Scoliosis     Past Surgical History:  Procedure Laterality Date   ABDOMINAL HYSTERECTOMY     still has ovaries, no gyn cancer, hysterectomy due to endometriosis. NO cervix on exam 01/10/21   BREAST BIOPSY Left 09/15/2020   [place] bx of mass, path pending, Q marker   BREAST BIOPSY Left 09/15/2020   [place] bx of LN, hydromarker, path pending   BREAST RECONSTRUCTION WITH PLACEMENT OF TISSUE EXPANDER AND FLEX HD (ACELLULAR HYDRATED DERMIS) Bilateral 03/17/2021   Procedure: IMMEDIATE BILATERAL BREAST RECONSTRUCTION WITH PLACEMENT OF TISSUE EXPANDER AND FLEX HD (ACELLULAR HYDRATED DERMIS);  Surgeon: Wallace Going, DO;  Location: [place];  Service: Plastics;  Laterality: Bilateral;   IR IMAGING GUIDED PORT INSERTION  10/01/2020   MODIFIED MASTECTOMY Left 03/17/2021   Procedure: LEFT MODIFIED RADICAL  MASTECTOMY;  Surgeon: Erroll Luna, MD;  Location: [place];  Service: General;  Laterality: Left;   PORTA CATH REMOVAL Right 03/17/2021   Procedure: PORTA CATH REMOVAL;  Surgeon: Erroll Luna, MD;  Location: [place];  Service: General;  Laterality: Right;   REMOVAL OF BILATERAL TISSUE EXPANDERS WITH PLACEMENT OF BILATERAL BREAST IMPLANTS Bilateral 05/09/2021   Procedure: REMOVAL OF BILATERAL TISSUE EXPANDERS WITH PLACEMENT OF BILATERAL BREAST IMPLANTS;  Surgeon: Wallace Going, DO;  Location: [place];  Service: Plastics;  Laterality: Bilateral;  90 min   TOTAL MASTECTOMY Right 03/17/2021   Procedure: TOTAL MASTECTOMY;  Surgeon: Erroll Luna, MD;  Location: [place];  Service: General;  Laterality: Right;    There were no vitals filed for this visit.   Subjective Assessment - 09/01/21 1122     Subjective Patient reports she is really sore in her B hips (R > L) and back and feels like "I am 50 years old" today. States she noticed the pain increasing yesterday and cannot relate it specifically to what she did in PT last session. She rates her pain 5/10 currently. She reports she did her HEP yesterday early in the morning on the bed and they went okay.    Pertinent History Patient is a 50 y.o. female who presents to outpatient physical therapy with a referral for medical  diagnosis Right-sided low back pain without sciatica, unspecified chronicity. This patient's chief complaints consist of worsening chronic R sided low back pain that intermittently radiates down the leg and across the back to the left side leading to the following functional deficits: sudden grabbing stops her in her tracks and she must sit down, stretching, bending, work responsibilities, dressing, petting cats, caring for cats, lifting, moving patients, yardwork, gardening, functional mobility, etc.  Relevant past medical history and comorbidities include  L breast cancer stage IIIb (dx Nov 2021. ER+/PR+ HER2-, underwent a bilateral mastectomy and ALND on L (7/16) on 03/17/21, reconstruction in summer 2022, neuropathy of hands, lower legs and feet, completed chemo, completed radiation Dec17, 2021 to February 18, 2021, currently taking oral medication and infusions), scoliosis, internal hemorrhoids, atherosclerosis of aorta, hepatic steatosis, chemotherapy -induced neuropathy, anxiety/depression, chronic insomnia, arthralgia, B12 deficiency, GERD, IBS, diverticulitis, current smoker (trying to quit), UTIs, abdominal hysterectomy.Patient denies hx of  stroke, seizures, lung problem, major cardiac events, diabetes, changes in bowel or bladder problems, new onset stumbling or dropping things (that appears related to cancer treatments), spinal surgeries, osteoporosis (but is taking medication that can weaken bones).    Limitations House hold activities;Lifting;Standing;Walking;Other (comment)   sudden grabbing stops her in her tracks and she must sit down, stretching, bending, work responsibilities, dressing, petting cats, caring for cats, lifting, moving patients, yardwork, gardening, functional mobility, etc.   Diagnostic tests R hip xray report 08/05/2021: negative. Lumbar xray report 10/06/2021: "IMPRESSION:  Severe lumbar spine scoliosis concave right. Diffuse multilevel  degenerative change. No acute or focal bony abnormality identified." CT abdomen/pelvis report 07/18/2021: "Musculoskeletal: No acute or destructive bony lesions. Left convex  thoracolumbar scoliosis. Reconstructed images demonstrate no  additional findings. IMPRESSION:  1. Diverticulosis of the rectosigmoid colon, with no evidence of  acute diverticulitis. Mild wall thickening of the sigmoid colon  likely reflects scarring from previous bouts of diverticulitis.  2. Normal appendix.  3. Mild hepatic steatosis.  4.  Aortic Atherosclerosis (ICD10-I70.0)."    Currently in Pain? Yes    Pain Score 5               TREATMENT:   denies latex sensitivity  Therapeutic exercise: to centralize symptoms and improve ROM, strength, muscular endurance, and activity tolerance required for successful completion of functional activities.  - NuStep level 3 using bilateral lower extremities. Seat setting 4 For improved extremity mobility, muscular endurance, and activity tolerance; and to induce the analgesic effect of aerobic exercise, stimulate improved joint nutrition, and prepare body structures and systems for following interventions. x 5 minutes. Average SPM = 51  With moist heat under back:  - hooklying pelvic tilt AROM anterior/posterior, 1x20 - hooklying abdominal brace, 2x10 each side.  - hooklying lower trunk rotation, 1x20 each side - hooklying bridge with B hip abduction against yellow theraband around knees, 2x10, with 5 second hold. (Left LE shaking, difficult).  - supine double knees to chest with green theraball under feet, 1x10 - hooklying low trunk rotation with green theraball under lower lets, 1x10 each side.    Pt required multimodal cuing for proper technique and to facilitate improved neuromuscular control, strength, range of motion, and functional ability resulting in improved performance and form.   HOME EXERCISE PROGRAM Access Code: [email or web address] URL: [email or web address] Date: 08/30/2021 Prepared by: Rosita Kea   Exercises Supine Posterior Pelvic Tilt - 1 x daily - 1 sets - 20 reps - 1 breath/5 seconds hold Supine March with Posterior Pelvic Tilt - 1 x  daily - 2 sets - 10 reps Hooklying Clamshell with Resistance - 1 x daily - 1 sets - 20 reps - 5 seconds hold Bridge - 1 x daily - 1 sets - 20 reps - 5 seconds hold     PT Education - 09/01/21 1124     Education Details Exercise purpose/form. Self management techniques    Person(s) Educated Patient    Methods Explanation;Demonstration;Tactile cues;Verbal cues    Comprehension Verbalized  understanding;Returned demonstration;Tactile cues required;Verbal cues required;Need further instruction              PT Short Term Goals - 08/30/21 1104       PT SHORT TERM GOAL #1   Title Be independent with initial home exercise program for self-management of symptoms.    Baseline initial HEP to be provided at visit 2 as appropriate (08/25/2021); initial HEP provided at visit 2 (08/30/2021);    Time 2    Period Weeks    Status Partially Met    Target Date 09/08/21               PT Long Term Goals - 08/30/21 1101       PT LONG TERM GOAL #1   Title Be independent with a long-term home exercise program for self-management of symptoms.    Baseline initial HEP to be provided at visit 2 as appropriate (08/25/2021); initial HEP provided on visit 2 (08/30/2021);    Time 12    Period Weeks    Status On-going   TARGET DATE FOR ALL LONG TERM GOALS: 11/17/2021     PT LONG TERM GOAL #2   Title Demonstrate improved FOTO to equal or greater than 71 by visit #10 to demonstrate improvement in overall condition and self-reported functional ability.    Baseline 54 (08/25/2021);    Time 12    Period Weeks    Status On-going      PT LONG TERM GOAL #3   Title Have full lumbar AROM with no compensations outside of those expected from scoliosis or increase in pain in all planes except intermittent end range discomfort to improve ability  to complete valued activities such bending, carrying, rolling, dressing, etc.    Baseline painful and limited - see objective (08/25/2021);    Time 12    Period Weeks    Status On-going      PT LONG TERM GOAL #4   Title Improve B hip strength to equal or greater than 4+/5 with no increase in pain to improve patient's ability to complete functional tasks such as working, dressing, walking, yardwork with less difficulty.    Baseline painful and weak - see objective (08/25/2021);    Time 12    Period Weeks    Status On-going      PT LONG TERM GOAL #5    Title Reduce pain with functional activities to equal or less than 1/10 to allow patient to complete usual activities including housework, walking, bending, sleeping with less difficulty.    Baseline up to 10/10 (08/25/2021);    Time 12    Period Weeks    Status On-going      PT LONG TERM GOAL #6   Title Complete community, work and/or recreational activities without limitation due to current condition.    Baseline sudden grabbing stops her in her tracks and she must sit down, stretching, bending, work responsibilities, dressing, petting cats, caring for cats, lifting, moving patients, yardwork, gardening, functional mobility, etc (08/25/2021);  Time 12    Period Weeks    Status On-going                   Plan - 09/01/21 1152     Clinical Impression Statement Patient tolerated treatment with some difficulty due to quick fatigue. Reported decreasing pain over the course of the session and was able to progress to bridge with hip abduction. Moist heat used behind back to improve comfort during exercises. Patient continues to have pain and weakness that limits her functional ability and decreases her quality of life. Patient would benefit from continued management of limiting condition by skilled physical therapist to address remaining impairments and functional limitations to work towards stated goals and return to PLOF or maximal functional independence.    Personal Factors and Comorbidities Comorbidity 3+;Past/Current Experience;Profession;Fitness;Social Background;Time since onset of injury/illness/exacerbation    Comorbidities Relevant past medical history and comorbidities include L breast cancer stage IIIb (dx Nov 2021. ER+/PR+ HER2-, underwent a bilateral mastectomy and ALND on L (7/16) on 03/17/21, reconstruction in summer 2022, neuropathy of hands, lower legs and feet, completed chemo, completed radiation Dec17, 2021 to February 18, 2021, currently taking oral medication and infusions),  scoliosis, internal hemorrhoids, atherosclerosis of aorta, hepatic steatosis, chemotherapy -induced neuropathy, anxiety/depression, chronic insomnia, arthralgia, B12 deficiency, GERD, IBS, diverticulitis, current smoker (trying to quit), UTIs, abdominal hysterectomy    Examination-Activity Limitations Bed Mobility;Lift;Squat;Bend;Locomotion Level;Stand;Caring for Others;Carry;Transfers;Sleep;Dressing    Examination-Participation Restrictions Laundry;Shop;Cleaning;Community Activity;Meal Prep;Occupation;Yard Work;Interpersonal Relationship;Other   sudden grabbing stops her in her tracks and she must sit down, stretching, bending, work responsibilities, dressing, petting cats, caring for cats, lifting, moving patients, yardwork, gardening, functional mobility, etc.   Stability/Clinical Decision Making Evolving/Moderate complexity    Rehab Potential Good    PT Frequency 2x / week    PT Duration 12 weeks    PT Treatment/Interventions ADLs/Self Care Home Management;Aquatic Therapy;Cryotherapy;Gait training;Stair training;Functional mobility training;Therapeutic activities;Therapeutic exercise;Balance training;Neuromuscular re-education;Manual techniques;Dry needling;Energy conservation;Patient/family education    PT Next Visit Plan core/functional/LE strengthening as tolerated    PT Home Exercise Plan Medbridge Access Code: [email or web address]    Consulted and Agree with Plan of Care Patient             Patient will benefit from skilled therapeutic intervention in order to improve the following deficits and impairments:  Abnormal gait, Impaired sensation, Improper body mechanics, Pain, Postural dysfunction, Hypermobility, Decreased activity tolerance, Decreased endurance, Decreased range of motion, Decreased strength, Impaired perceived functional ability, Difficulty walking, Decreased balance  Visit Diagnosis: Chronic right-sided low back pain, unspecified whether sciatica present  Sacrococcygeal disorders,  not elsewhere classified  Muscle weakness (generalized)  Difficulty in walking, not elsewhere classified     Problem List Patient Active Problem List   Diagnosis Date Noted   Acquired absence of breast 08/16/2021   Dysuria 08/05/2021   B12 deficiency 07/21/2021   Atherosclerosis of aorta ([place]) 07/20/2021   Hepatic steatosis 07/20/2021   Abdominal pain 07/18/2021   Breast cancer ([place]) 03/17/2021   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[place]) 03/14/2021   Genetic testing 11/22/2020   Family history of ovarian cancer    Candidal vulvovaginitis 11/01/2020   Low back pain 10/06/2020   Encounter for medical examination to establish care 09/26/2020   Malignant neoplasm of upper-outer quadrant of left breast in female, estrogen receptor positive ([place]) 09/26/2020   BACK STRAIN, LUMBAR 03/08/2010   INTERNAL HEMORRHOIDS 01/27/2010   ANAL FISSURE 01/27/2010   OSTEOARTHRITIS, HANDS, BILATERAL 01/04/2010   ARTHRALGIA 10/05/2009   INSOMNIA, CHRONIC 05/06/2009  ALLERGIC RHINITIS, SEASONAL 02/09/2009   TOBACCO ABUSE 12/29/2008   IBS 10/13/2008   Anxiety state 08/25/2008   DEPRESSION, RECURRENT 08/25/2008    Everlean Alstrom. Graylon Good, PT, DPT 09/01/21, 12:01 PM   [facility] [address], [place], [address] Phone: [phone]   Fax:  [phone]  Name: RENEE ERB MRN: [id number] Date of Birth: 06-04-1971

## 2021-09-05 ENCOUNTER — Other Ambulatory Visit: Payer: Self-pay

## 2021-09-05 ENCOUNTER — Other Ambulatory Visit: Payer: No Typology Code available for payment source

## 2021-09-05 ENCOUNTER — Ambulatory Visit: Payer: No Typology Code available for payment source

## 2021-09-05 ENCOUNTER — Inpatient Hospital Stay: Payer: No Typology Code available for payment source

## 2021-09-05 ENCOUNTER — Inpatient Hospital Stay: Payer: No Typology Code available for payment source | Attending: Oncology | Admitting: Oncology

## 2021-09-05 ENCOUNTER — Encounter: Payer: Self-pay | Admitting: Oncology

## 2021-09-05 VITALS — BP 118/85 | HR 79 | Resp 17

## 2021-09-05 VITALS — BP 105/89 | HR 87 | Temp 96.7°F | Resp 16 | Wt 119.9 lb

## 2021-09-05 DIAGNOSIS — Z5112 Encounter for antineoplastic immunotherapy: Secondary | ICD-10-CM

## 2021-09-05 DIAGNOSIS — Z5111 Encounter for antineoplastic chemotherapy: Secondary | ICD-10-CM | POA: Diagnosis not present

## 2021-09-05 DIAGNOSIS — Z170 Estrogen receptor positive status [ER+]: Secondary | ICD-10-CM

## 2021-09-05 DIAGNOSIS — C50412 Malignant neoplasm of upper-outer quadrant of left female breast: Secondary | ICD-10-CM

## 2021-09-05 LAB — CBC WITH DIFFERENTIAL/PLATELET
Abs Immature Granulocytes: 0.01 10*3/uL (ref 0.00–0.07)
Basophils Absolute: 0 10*3/uL (ref 0.0–0.1)
Basophils Relative: 1 %
Eosinophils Absolute: 0.1 10*3/uL (ref 0.0–0.5)
Eosinophils Relative: 2 %
HCT: 43.1 % (ref 36.0–46.0)
Hemoglobin: 13.9 g/dL (ref 12.0–15.0)
Immature Granulocytes: 0 %
Lymphocytes Relative: 33 %
Lymphs Abs: 1.8 10*3/uL (ref 0.7–4.0)
MCH: 29.3 pg (ref 26.0–34.0)
MCHC: 32.3 g/dL (ref 30.0–36.0)
MCV: 90.9 fL (ref 80.0–100.0)
Monocytes Absolute: 0.4 10*3/uL (ref 0.1–1.0)
Monocytes Relative: 8 %
Neutro Abs: 3 10*3/uL (ref 1.7–7.7)
Neutrophils Relative %: 56 %
Platelets: 240 10*3/uL (ref 150–400)
RBC: 4.74 MIL/uL (ref 3.87–5.11)
RDW: 12.9 % (ref 11.5–15.5)
WBC: 5.4 10*3/uL (ref 4.0–10.5)
nRBC: 0 % (ref 0.0–0.2)

## 2021-09-05 LAB — COMPREHENSIVE METABOLIC PANEL
ALT: 17 U/L (ref 0–44)
AST: 21 U/L (ref 15–41)
Albumin: 4 g/dL (ref 3.5–5.0)
Alkaline Phosphatase: 98 U/L (ref 38–126)
Anion gap: 7 (ref 5–15)
BUN: 9 mg/dL (ref 6–20)
CO2: 28 mmol/L (ref 22–32)
Calcium: 9.6 mg/dL (ref 8.9–10.3)
Chloride: 102 mmol/L (ref 98–111)
Creatinine, Ser: 0.82 mg/dL (ref 0.44–1.00)
GFR, Estimated: >60 mL/min (ref >60)
Glucose, Bld: 146 mg/dL — ABNORMAL HIGH (ref 70–99)
Potassium: 4.4 mmol/L (ref 3.5–5.1)
Sodium: 137 mmol/L (ref 135–145)
Total Bilirubin: 0.3 mg/dL (ref 0.3–1.2)
Total Protein: 7.3 g/dL (ref 6.5–8.1)

## 2021-09-05 MED ORDER — PERTUZ-TRASTUZ-HYALURON-ZZXF 60-60-2000 MG-MG-U/ML CHEMO ~~LOC~~ SOLN
10.0000 mL | Freq: Once | SUBCUTANEOUS | Status: AC
  Administered 2021-09-05: 10 mL via SUBCUTANEOUS
  Filled 2021-09-05: qty 10

## 2021-09-05 MED ORDER — ACETAMINOPHEN 325 MG PO TABS
650.0000 mg | ORAL_TABLET | Freq: Once | ORAL | Status: AC
  Administered 2021-09-05: 650 mg via ORAL
  Filled 2021-09-05: qty 2

## 2021-09-05 MED ORDER — DIPHENHYDRAMINE HCL 25 MG PO CAPS
50.0000 mg | ORAL_CAPSULE | Freq: Once | ORAL | Status: AC
  Administered 2021-09-05: 50 mg via ORAL
  Filled 2021-09-05: qty 2

## 2021-09-05 NOTE — Progress Notes (Signed)
1136: Pt tolerated injection well. Pt monitored 15 minutes per order. Pt and VS stable at discharge.

## 2021-09-05 NOTE — Progress Notes (Signed)
Hematology/Oncology Consult note [facility]  Telephone:([phone] [phone]  Patient Care Team: Burnard Hawthorne, FNP as PCP - General (Family Medicine) Rico Junker, RN as Oncology Nurse Navigator   Name of the patient: Tricia Berry  [id number]  1971-08-18   Date of visit: 09/05/21  Diagnosis- invasive mammary carcinoma of the left breast stage III ypT2 ypN2 cM0 ER/PR positive and HER2 positive by [facility]  Chief complaint/ Reason for visit-on treatment assessment prior to cycle 7 of adjuvant Herceptin and Perjeta  Heme/Onc history: patient is a 50 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  She then noticed that her lump is gradually getting larger with an area of ulceration on the skin and underwent a diagnostic bilateral mammogram on 09/08/2020 which showed hypoechoic interconnected masses in the left breast from 10:00 to 2 o'clock position spanning at least an area of 4.8 cm.  Ultrasound also showed 5 abnormal lymph nodes in the axilla with cortical thickening.  Both the mass and the lymph nodes were biopsied and was consistent with invasive mammary carcinoma grade 2.  Tumor was ER +91- 100%, PR +11 to 20% and HER-2 negative.  Ki-67 15%. Patient will be meeting [name] from [facility] surgery to discuss surgical management.  She is here for medical oncology recommendations.  In terms of her general health patient is otherwise doing well and does not have significant comorbidities.  She does endorse significant pain in the area of her left breast.  Tylenol and Motrin has not been helping her with this pain.  She lives with her husband and 2 children who have special needs.  No prior history of abnormal breast mammograms or breast biopsies.   PET CT scan showed hypermetabolism in  the area of the left breast but no evidence of hypermetabolism in the left axilla Or evidence of distant metastatic disease.   Neoadjuvant dose dense ACT chemotherapy started on 10/08/2020. Interim ultrasound after 4 cycles of dose dense AC chemotherapy showed overall decrease in volume of the tumor.  Nodularity previously seen on ultrasound was also decreased in size.   Patient completed neoadjuvant AC Taxol chemotherapy and underwent bilateral mastectomy with reconstruction.Final pathology showed fibrocystic changes in the right breast with no malignancy.  3.6 cm invasive mammary carcinoma in the left breast.  7 out of 16 lymph nodes positive for malignancy.  Treatment effect in the breast present but not robust.  Treatment effect in the lymph nodes minimal.  Margins negative.  Overall grade 2.  Extranodal extension present.  ER greater than 90% positive, PR 15% positive.  HER2 +2 equivocal and positive by FISH  Interval history-she is making slow improvements.  Still has ongoing fatigue.  Reports peripheral neuropathy in especially in her feet for which she is on Lyrica.  She tried going back to Cymbalta but then had drenching night sweats and therefore could not use it.  She is also using oxycodone about 3 times a day for both for her neuropathy and her back pain.  She has seen orthopedics for her back pain and undergoing physical therapy.  She is taking Arimidex along with calcium and vitamin D  ECOG PS- 1 Pain scale- 4   Review of systems- Review of Systems  Constitutional:  Positive for malaise/fatigue. Negative for chills,  fever and weight loss.  HENT:  Negative for congestion, ear discharge and nosebleeds.   Eyes:  Negative for blurred vision.  Respiratory:  Negative for cough, hemoptysis, sputum production, shortness of breath and wheezing.   Cardiovascular:  Negative for chest pain, palpitations, orthopnea and claudication.  Gastrointestinal:  Negative for abdominal pain, blood in  stool, constipation, diarrhea, heartburn, melena, nausea and vomiting.  Genitourinary:  Negative for dysuria, flank pain, frequency, hematuria and urgency.  Musculoskeletal:  Negative for back pain, joint pain and myalgias.  Skin:  Negative for rash.  Neurological:  Positive for sensory change (Peripheral neuropathy). Negative for dizziness, tingling, focal weakness, seizures, weakness and headaches.  Endo/Heme/Allergies:  Does not bruise/bleed easily.  Psychiatric/Behavioral:  Negative for depression and suicidal ideas. The patient does not have insomnia.      Allergies  Allergen Reactions   Morphine Nausea And Vomiting and Other (See Comments)    migranes Other reaction(s): Headache Migraine, vomiting   Sertraline Hcl     REACTION: Worsened symptoms of IBS   Sulfa Antibiotics Rash   Sulfamethoxazole Rash   Sulfonamide Derivatives Rash     Past Medical History:  Diagnosis Date   Anxiety    Breast cancer ([place])    Diverticulitis    Family history of ovarian cancer    GERD (gastroesophageal reflux disease)    IBS (irritable bowel syndrome)    PONV (postoperative nausea and vomiting)    severe migraine and vomiting post anesthesia   Scoliosis      Past Surgical History:  Procedure Laterality Date   ABDOMINAL HYSTERECTOMY     still has ovaries, no gyn cancer, hysterectomy due to endometriosis. NO cervix on exam 01/10/21   BREAST BIOPSY Left 09/15/2020   [place] bx of mass, path pending, Q marker   BREAST BIOPSY Left 09/15/2020   [place] bx of LN, hydromarker, path pending   BREAST RECONSTRUCTION WITH PLACEMENT OF TISSUE EXPANDER AND FLEX HD (ACELLULAR HYDRATED DERMIS) Bilateral 03/17/2021   Procedure: IMMEDIATE BILATERAL BREAST RECONSTRUCTION WITH PLACEMENT OF TISSUE EXPANDER AND FLEX HD (ACELLULAR HYDRATED DERMIS);  Surgeon: Wallace Going, DO;  Location: [place];  Service: Plastics;  Laterality: Bilateral;   IR IMAGING GUIDED PORT INSERTION  10/01/2020    MODIFIED MASTECTOMY Left 03/17/2021   Procedure: LEFT MODIFIED RADICAL MASTECTOMY;  Surgeon: Erroll Luna, MD;  Location: [place];  Service: General;  Laterality: Left;   PORTA CATH REMOVAL Right 03/17/2021   Procedure: PORTA CATH REMOVAL;  Surgeon: Erroll Luna, MD;  Location: [place];  Service: General;  Laterality: Right;   REMOVAL OF BILATERAL TISSUE EXPANDERS WITH PLACEMENT OF BILATERAL BREAST IMPLANTS Bilateral 05/09/2021   Procedure: REMOVAL OF BILATERAL TISSUE EXPANDERS WITH PLACEMENT OF BILATERAL BREAST IMPLANTS;  Surgeon: Wallace Going, DO;  Location: [place];  Service: Plastics;  Laterality: Bilateral;  90 min   TOTAL MASTECTOMY Right 03/17/2021   Procedure: TOTAL MASTECTOMY;  Surgeon: Erroll Luna, MD;  Location: [place];  Service: General;  Laterality: Right;    Social History   Socioeconomic History   Marital status: Divorced    Spouse name: Not on file   Number of children: 2   Years of education: Not on file   Highest education level: Not on file  Occupational History   Occupation: Nurse    Employer: [employer]  Tobacco Use   Smoking status: Every Day    Packs/day: 0.25    Types: Cigarettes  Smokeless tobacco: Never   Tobacco comments:    Pt trying to quit now. Has reduced amount significantly  Vaping Use   Vaping Use: Never used  Substance and Sexual Activity   Alcohol use: Not Currently   Drug use: No   Sexual activity: Not Currently    Birth control/protection: None  Other Topics Concern   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 She and her spouse are primary caregivers.    Social Determinants of Health   Financial Resource Strain: Not on file  Food Insecurity: Not on file  Transportation Needs: Not on file  Physical Activity: Not on file  Stress: Not on file  Social  Connections: Not on file  Intimate Partner Violence: Not on file    Family History  Problem Relation Age of Onset   Hypertension Mother    Osteoarthritis Mother    Diverticulitis Mother    Heart failure Father    Hypertension Father    Gout Father    Diverticulitis Brother    Ovarian cancer Paternal Grandmother      Current Outpatient Medications:    anastrozole (ARIMIDEX) 1 MG tablet, Take 1 tablet (1 mg total) by mouth daily., Disp: 30 tablet, Rfl: 2   cyanocobalamin (,VITAMIN B-12,) 1000 MCG/ML injection, 1000 mcg (1 mL) intramuscular injection in the thigh ( vastus lateralis) once per month., Disp: 3 mL, Rfl: 4   Lactobacillus (PROBIOTIC ACIDOPHILUS PO), Take 1 capsule by mouth daily., Disp: , Rfl:    lidocaine-prilocaine (EMLA) cream, Apply 1 application topically as needed., Disp: 30 g, Rfl: 0   LORazepam (ATIVAN) 0.5 MG tablet, Take 1 tablet (0.5 mg total) by mouth at bedtime as needed for up to 30 doses for anxiety., Disp: 30 tablet, Rfl: 0   Multiple Vitamins-Minerals (MULTIVITAL PO), Take 1 Dose by mouth daily., Disp: , Rfl:    nicotine (NICODERM CQ - DOSED IN MG/24 HOURS) 21 mg/24hr patch, use 1 patch daily, Disp: 28 patch, Rfl: 0   nicotine (NICOTROL) 10 MG inhaler, FOLLOW INSTRUCTIONS ON PACKAGE AS NEEDED FOR SMOKING CESSATION, Disp: 168 each, Rfl: 0   nicotine polacrilex (SM NICOTINE) 2 MG lozenge, Take by mouth., Disp: 72 lozenge, Rfl: 0   ondansetron (ZOFRAN) 8 MG tablet, Take 1 tablet (8 mg total) by mouth every 8 (eight) hours as needed for nausea or vomiting., Disp: 601 tablet, Rfl: 0   Oxycodone HCl 10 MG TABS, Take 1 tablet (10 mg total) by mouth every 6 (six) hours as needed., Disp: 120 tablet, Rfl: 0   pregabalin (LYRICA) 75 MG capsule, Take 1 capsule (75 mg total) by mouth 2 (two) times daily., Disp: 60 capsule, Rfl: 2   tiZANidine (ZANAFLEX) 4 MG tablet, Take 4 mg by mouth 3 (three) times daily., Disp: , Rfl:    DULoxetine (CYMBALTA) 30 MG capsule, Take 1  capsule (30 mg total) by mouth daily. (Patient not taking: Reported on 09/05/2021), Disp: 90 capsule, Rfl: 3   nitrofurantoin, macrocrystal-monohydrate, (MACROBID) 100 MG capsule, Take 1 capsule (100 mg total) by mouth 2 (two) times daily. Take with food. (Patient not taking: Reported on 09/05/2021), Disp: 10 capsule, Rfl: 0   rosuvastatin (CRESTOR) 5 MG tablet, Take 1 tablet (5 mg total) by mouth every evening. (Patient not taking: Reported on 09/05/2021), Disp: 90 tablet, Rfl: 3   traZODone (DESYREL) 50 MG tablet, Take 0.5-1 tablets (25-50 mg  total) by mouth at bedtime as needed for sleep. (Patient not taking: Reported on 09/05/2021), Disp: 30 tablet, Rfl: 3 No current facility-administered medications for this visit.  Facility-Administered Medications Ordered in Other Visits:    goserelin (ZOLADEX) injection 3.6 mg, 3.6 mg, Subcutaneous, Q28 days, Sindy Guadeloupe, MD, 3.6 mg at 05/04/21 1150   heparin lock flush 100 unit/mL, 500 Units, Intravenous, Once, Sindy Guadeloupe, MD  Physical exam:  Vitals:   09/05/21 1000  BP: 105/89  Pulse: 87  Resp: 16  Temp: (!) 96.7 F (35.9 C)  SpO2: 98%  Weight: 119 lb 14.4 oz (54.4 kg)   Physical Exam Constitutional:      General: She is not in acute distress. Cardiovascular:     Rate and Rhythm: Normal rate and regular rhythm.     Heart sounds: Normal heart sounds.  Pulmonary:     Effort: Pulmonary effort is normal.     Breath sounds: Normal breath sounds.  Abdominal:     General: Bowel sounds are normal.     Palpations: Abdomen is soft.  Skin:    General: Skin is warm and dry.  Neurological:     Mental Status: She is alert and oriented to person, place, and time.     CMP Latest Ref Rng & Units 09/05/2021  Glucose 70 - 99 mg/dL 146(H)  BUN 6 - 20 mg/dL 9  Creatinine 0.44 - 1.00 mg/dL 0.82  Sodium 135 - 145 mmol/L 137  Potassium 3.5 - 5.1 mmol/L 4.4  Chloride 98 - 111 mmol/L 102  CO2 22 - 32 mmol/L 28  Calcium 8.9 - 10.3 mg/dL 9.6   Total Protein 6.5 - 8.1 g/dL 7.3  Total Bilirubin 0.3 - 1.2 mg/dL 0.3  Alkaline Phos 38 - 126 U/L 98  AST 15 - 41 U/L 21  ALT 0 - 44 U/L 17   CBC Latest Ref Rng & Units 09/05/2021  WBC 4.0 - 10.5 K/uL 5.4  Hemoglobin 12.0 - 15.0 g/dL 13.9  Hematocrit 36.0 - 46.0 % 43.1  Platelets 150 - 400 K/uL 240    No images are attached to the encounter.  DG Bone Density  Result Date: 08/11/2021 EXAM: DUAL X-RAY ABSORPTIOMETRY (DXA) FOR BONE MINERAL DENSITY IMPRESSION: Your patient Seanna Sisler completed a BMD test on 08/11/2021 using the [place] (software version: 14.10) manufactured by [employer]. The following summarizes the results of our evaluation. Technologist: MTB PATIENT BIOGRAPHICAL: Name: Christain, Mcraney Patient ID: [id number] Birth Date: Sep 12, 1971 Height: 60.0 in. Gender: Female Exam Date: 08/11/2021 Weight: 115.0 lbs. Indications: Caucasian, High Risk Meds, History of Breast Cancer, History of Chemo, Hysterectomy, Osteoarthritis, Tobacco User Fractures: Treatments: Calcium, Vitamin D DENSITOMETRY RESULTS: Site         Region     Measured Date Measured Age WHO Classification Young Adult T-score BMD         %Change vs. Previous Significant Change (*) DualFemur Neck Right 08/11/2021 51.2 Normal -1.0 0.893 g/cm2 - - DualFemur Total Mean 08/11/2021 51.2 Normal -0.8 0.905 g/cm2 - - Left Forearm Radius 33% 08/11/2021 51.2 Normal -0.6 0.827 g/cm2 - - ASSESSMENT: The BMD measured at Femur Neck Right is 0.893 g/cm2 with a T-score of -1.0. This patient is considered normal according to [facility]) criteria. The scan quality is good. Lumbar spine was not utilized due to scoliosis. World Health Organization [facility]) criteria for post-menopausal, Caucasian Women: Normal:  T-score at or above -1 SD Osteopenia/low bone mass: T-score between -1 and -2.5 SD Osteoporosis:             T-score at or below -2.5 SD RECOMMENDATIONS: 1. All patients  should optimize calcium and vitamin D intake. 2. Consider FDA-approved medical therapies in postmenopausal women and men aged 52 years and older, based on the following: a. A hip or vertebral(clinical or morphometric) fracture b. T-score < -2.5 at the femoral neck or spine after appropriate evaluation to exclude secondary causes c. Low bone mass (T-score between -1.0 and -2.5 at the femoral neck or spine) and a 10-year probability of a hip fracture > 3% or a 10-year probability of a major osteoporosis-related fracture > 20% based on the [place]-adapted WHO algorithm 3. Clinician judgment and/or patient preferences may indicate treatment for people with 10-year fracture probabilities above or below these levels FOLLOW-UP: People with diagnosed cases of osteoporosis or at high risk for fracture should have regular bone mineral density tests. For patients eligible for Medicare, routine testing is allowed once every 2 years. The testing frequency can be increased to one year for patients who have rapidly progressing disease, those who are receiving or discontinuing medical therapy to restore bone mass, or have additional risk factors. I have reviewed this report, and agree with the above findings. [facility] Electronically Signed   By: Rolm Baptise M.D.   On: 08/11/2021 20:36     Assessment and plan- Patient is a 50 y.o. female  with known history of locally advanced left breast cancer which was ER/PR positive and HER2 negative on initial biopsy s/p neoadjuvant AC Taxol chemotherapy final pathology showed 3.6 cm residual breast mass with 7 positive lymph nodes with extracapsular extension.  ER/PR positive and HER2 positive.  She is here for following issues:  Counts okay to proceed with cycle 7 of Herceptin and Perjeta which she is receiving as subcutaneous Phesgo injection every 3 weeks to complete 1 year of treatment.  She will directly proceed for cycle 8 and I will see her back in 6 weeks for cycle  9.She would need a repeat echocardiogram in January 2022. 2.  Patient has completed adjuvant radiation treatment and is currently on ovarian suppression with Zoladex which she will receive on 09/23/2021 next doseAnd she is also currently taking Arimidex along with calcium and vitamin D which she will continue. 3. Chemo induced peripheral neuropathy: Continue Lyrica along with as needed oxycodone and I have asked her to continue to try cutting down on her dose of oxycodone.  She is currently using it about 3 times a day 4.  Anxiety: On Ativan nightly   Visit Diagnosis 1. Encounter for monoclonal antibody treatment for malignancy   2. Malignant neoplasm of upper-outer quadrant of left breast in female, estrogen receptor positive ([place])      Dr. Randa Evens, MD, MPH [facility] [phone] 09/05/2021 12:39 PM

## 2021-09-05 NOTE — Patient Instructions (Signed)
[facility] ONCOLOGY  Discharge Instructions: Thank you for choosing [facility] to provide your oncology and hematology care.  If you have a lab appointment with the [facility], please go directly to the [facility] and check in at the registration area.  Wear comfortable clothing and clothing appropriate for easy access to any Portacath or PICC line.   We strive to give you quality time with your provider. You may need to reschedule your appointment if you arrive late (15 or more minutes).  Arriving late affects you and other patients whose appointments are after yours.  Also, if you miss three or more appointments without notifying the office, you may be dismissed from the clinic at the provider's discretion.      For prescription refill requests, have your pharmacy contact our office and allow 72 hours for refills to be completed.    Today you received the following chemotherapy and/or immunotherapy agents Phesgo      To help prevent nausea and vomiting after your treatment, we encourage you to take your nausea medication as directed.  BELOW ARE SYMPTOMS THAT SHOULD BE REPORTED IMMEDIATELY: *FEVER GREATER THAN 100.4 F (38 C) OR HIGHER *CHILLS OR SWEATING *NAUSEA AND VOMITING THAT IS NOT CONTROLLED WITH YOUR NAUSEA MEDICATION *UNUSUAL SHORTNESS OF BREATH *UNUSUAL BRUISING OR BLEEDING *URINARY PROBLEMS (pain or burning when urinating, or frequent urination) *BOWEL PROBLEMS (unusual diarrhea, constipation, pain near the anus) TENDERNESS IN MOUTH AND THROAT WITH OR WITHOUT PRESENCE OF ULCERS (sore throat, sores in mouth, or a toothache) UNUSUAL RASH, SWELLING OR PAIN  UNUSUAL VAGINAL DISCHARGE OR ITCHING   Items with * indicate a potential emergency and should be followed up as soon as possible or go to the Emergency Department if any problems should occur.  Please show the CHEMOTHERAPY ALERT CARD or IMMUNOTHERAPY ALERT CARD at check-in to  the Emergency Department and triage nurse.  Should you have questions after your visit or need to cancel or reschedule your appointment, please contact [facility]  [phone] and follow the prompts.  Office hours are 8:00 a.m. to 4:30 p.m. Monday - Friday. Please note that voicemails left after 4:00 p.m. may not be returned until the following business day.  We are closed weekends and major holidays. You have access to a nurse at all times for urgent questions. Please call the main number to the clinic [phone] and follow the prompts.  For any non-urgent questions, you may also contact your provider using MyChart. We now offer e-Visits for anyone 23 and older to request care online for non-urgent symptoms. For details visit [email or web address].   Also download the MyChart app! Go to the app store, search "MyChart", open the app, select , and log in with your MyChart username and password.  Due to Covid, a mask is required upon entering the hospital/clinic. If you do not have a mask, one will be given to you upon arrival. For doctor visits, patients may have 1 support person aged 74 or older with them. For treatment visits, patients cannot have anyone with them due to current Covid guidelines and our immunocompromised population.

## 2021-09-05 NOTE — Progress Notes (Signed)
Pt will like to know if she can have a UA to rule out a UTI; starting to experience painful burning when urinating.

## 2021-09-06 ENCOUNTER — Encounter: Payer: Self-pay | Admitting: Physical Therapy

## 2021-09-07 ENCOUNTER — Ambulatory Visit: Payer: No Typology Code available for payment source | Admitting: Physical Therapy

## 2021-09-08 ENCOUNTER — Telehealth: Payer: Self-pay | Admitting: Family

## 2021-09-08 ENCOUNTER — Other Ambulatory Visit: Payer: Self-pay | Admitting: Internal Medicine

## 2021-09-08 ENCOUNTER — Other Ambulatory Visit: Payer: Self-pay

## 2021-09-08 DIAGNOSIS — R300 Dysuria: Secondary | ICD-10-CM

## 2021-09-08 MED ORDER — FLUCONAZOLE 150 MG PO TABS
150.0000 mg | ORAL_TABLET | Freq: Once | ORAL | 0 refills | Status: AC
  Filled 2021-09-08: qty 2, 2d supply, fill #0

## 2021-09-08 NOTE — Telephone Encounter (Signed)
I called and patient unavailable. I spoke with husband per DPR & aware the Diflucan sent to [facility].

## 2021-09-08 NOTE — Telephone Encounter (Signed)
Patient just was prescribe a few weeks ago medication for a yeast infection, by [name]. Patient recently finished a antibiotic and now has another yeast infection and would like medication. No available appointments this week.

## 2021-09-08 NOTE — Telephone Encounter (Signed)
Sent yeast pill [facility]

## 2021-09-09 NOTE — Telephone Encounter (Signed)
noted 

## 2021-09-13 ENCOUNTER — Ambulatory Visit (INDEPENDENT_AMBULATORY_CARE_PROVIDER_SITE_OTHER): Payer: No Typology Code available for payment source | Admitting: Adult Health

## 2021-09-13 ENCOUNTER — Encounter: Payer: Self-pay | Admitting: Adult Health

## 2021-09-13 ENCOUNTER — Other Ambulatory Visit: Payer: Self-pay

## 2021-09-13 ENCOUNTER — Ambulatory Visit: Payer: No Typology Code available for payment source | Admitting: Physical Therapy

## 2021-09-13 VITALS — BP 112/74 | HR 93 | Temp 97.1°F | Ht 60.0 in | Wt 112.2 lb

## 2021-09-13 DIAGNOSIS — N3001 Acute cystitis with hematuria: Secondary | ICD-10-CM

## 2021-09-13 DIAGNOSIS — R399 Unspecified symptoms and signs involving the genitourinary system: Secondary | ICD-10-CM

## 2021-09-13 LAB — POCT URINALYSIS DIPSTICK
Bilirubin, UA: NEGATIVE
Glucose, UA: NEGATIVE
Ketones, UA: NEGATIVE
Nitrite, UA: POSITIVE
Protein, UA: POSITIVE — AB
Spec Grav, UA: 1.015 (ref 1.010–1.025)
Urobilinogen, UA: 0.2 E.U./dL
pH, UA: 5.5 (ref 5.0–8.0)

## 2021-09-13 LAB — URINALYSIS, MICROSCOPIC ONLY

## 2021-09-13 MED ORDER — NITROFURANTOIN MONOHYD MACRO 100 MG PO CAPS: 100.0000 mg | ORAL_CAPSULE | Freq: Two times a day (BID) | ORAL | 0 refills | Status: AC

## 2021-09-13 NOTE — Patient Instructions (Signed)
Urinary Tract Infection, Adult A urinary tract infection (UTI) is an infection of any part of the urinary tract. The urinary tract includes: The kidneys. The ureters. The bladder. The urethra. These organs make, store, and get rid of pee (urine) in the body. What are the causes? This infection is caused by germs (bacteria) in your genital area. These germs grow and cause swelling (inflammation) of your urinary tract. What increases the risk? The following factors may make you more likely to develop this condition: Using a small, thin tube (catheter) to drain pee. Not being able to control when you pee or poop (incontinence). Being female. If you are female, these things can increase the risk: Using these methods to prevent pregnancy: A medicine that kills sperm (spermicide). A device that blocks sperm (diaphragm). Having low levels of a female hormone (estrogen). Being pregnant. You are more likely to develop this condition if: You have genes that add to your risk. You are sexually active. You take antibiotic medicines. You have trouble peeing because of: A prostate that is bigger than normal, if you are female. A blockage in the part of your body that drains pee from the bladder. A kidney stone. A nerve condition that affects your bladder. Not getting enough to drink. Not peeing often enough. You have other conditions, such as: Diabetes. A weak disease-fighting system (immune system). Sickle cell disease. Gout. Injury of the spine. What are the signs or symptoms? Symptoms of this condition include: Needing to pee right away. Peeing small amounts often. Pain or burning when peeing. Blood in the pee. Pee that smells bad or not like normal. Trouble peeing. Pee that is cloudy. Fluid coming from the vagina, if you are female. Pain in the belly or lower back. Other symptoms include: Vomiting. Not feeling hungry. Feeling mixed up (confused). This may be the first symptom in  older adults. Being tired and grouchy (irritable). A fever. Watery poop (diarrhea). How is this treated? Taking antibiotic medicine. Taking other medicines. Drinking enough water. In some cases, you may need to see a specialist. Follow these instructions at home: Medicines Take over-the-counter and prescription medicines only as told by your doctor. If you were prescribed an antibiotic medicine, take it as told by your doctor. Do not stop taking it even if you start to feel better. General instructions Make sure you: Pee until your bladder is empty. Do not hold pee for a long time. Empty your bladder after sex. Wipe from front to back after peeing or pooping if you are a female. Use each tissue one time when you wipe. Drink enough fluid to keep your pee pale yellow. Keep all follow-up visits. Contact a doctor if: You do not get better after 1-2 days. Your symptoms go away and then come back. Get help right away if: You have very bad back pain. You have very bad pain in your lower belly. You have a fever. You have chills. You feeling like you will vomit or you vomit. Summary A urinary tract infection (UTI) is an infection of any part of the urinary tract. This condition is caused by germs in your genital area. There are many risk factors for a UTI. Treatment includes antibiotic medicines. Drink enough fluid to keep your pee pale yellow. This information is not intended to replace advice given to you by your health care provider. Make sure you discuss any questions you have with your health care provider. Document Revised: 05/21/2020 Document Reviewed: 05/21/2020 Elsevier Patient Education  2022  [place]. Nitrofurantoin Capsules or Tablets What is this medication? NITROFURANTOIN (nye troe fyoor AN toyn) treats urinary tract infections caused by bacteria. It belongs to a group of medications called antibiotics. It will not treat colds, the flu, or infections caused by  viruses. This medicine may be used for other purposes; ask your health care provider or pharmacist if you have questions. COMMON BRAND NAME(S): Macrobid, Macrodantin, Urotoin What should I tell my care team before I take this medication? They need to know if you have any of these conditions: Anemia Diabetes Glucose-6-phosphate dehydrogenase deficiency Kidney disease Liver disease Lung disease Other chronic illness An unusual or allergic reaction to nitrofurantoin, other antibiotics, other medications, foods, dyes or preservatives Pregnant or trying to get pregnant Breast-feeding How should I use this medication? Take this medication by mouth with a glass of water. Follow the directions on the prescription label. Take this medication with food or milk. Take your doses at regular intervals. Do not take your medication more often than directed. Do not stop taking except on your care team's advice. Talk to your care team about the use of this medication in children. While this medication may be prescribed for selected conditions, precautions do apply. Overdosage: If you think you have taken too much of this medicine contact a poison control center or emergency room at once. NOTE: This medicine is only for you. Do not share this medicine with others. What if I miss a dose? If you miss a dose, take it as soon as you can. If it is almost time for your next dose, take only that dose. Do not take double or extra doses. What may interact with this medication? Antacids containing magnesium trisilicate Probenecid Quinolone antibiotics like ciprofloxacin, lomefloxacin, norfloxacin and ofloxacin Sulfinpyrazone This list may not describe all possible interactions. Give your health care provider a list of all the medicines, herbs, non-prescription drugs, or dietary supplements you use. Also tell them if you smoke, drink alcohol, or use illegal drugs. Some items may interact with your medicine. What  should I watch for while using this medication? Tell your care team if your symptoms do not improve or if you get new symptoms. Drink several glasses of water a day. If you are taking this medication for a long time, visit your care team for regular checks on your progress. If you are diabetic, you may get a false positive result for sugar in your urine with certain brands of urine tests. Check with your care team. What side effects may I notice from receiving this medication? Side effects that you should report to your care team as soon as possible: Allergic reactions--skin rash, itching, hives, swelling of the face, lips, tongue, or throat Dry cough, shortness of breath or trouble breathing Liver injury--right upper belly pain, loss of appetite, nausea, light-colored stool, dark yellow or brown urine, yellowing skin or eyes, unusual weakness or fatigue Low red blood cell count--unusual weakness or fatigue, dizziness, headache, trouble breathing Pain, tingling, or numbness in the hands or feet Redness, blistering, peeling, or loosening of the skin, including inside the mouth Severe diarrhea, fever Sudden eye pain or change in vision such as blurry vision, seeing halos around lights, vision loss Unusual vaginal discharge, itching, or odor Side effects that usually do not require medical attention (report to your care team if they continue or are bothersome): Loss of appetite Nausea Vomiting This list may not describe all possible side effects. Call your doctor for medical advice about side effects.  You may report side effects to FDA at 1-800-FDA-1088. Where should I keep my medication? Keep out of the reach of children. Store at room temperature between 15 and 30 degrees C (59 and 86 degrees F). Protect from light. Throw away any unused medication after the expiration date. NOTE: This sheet is a summary. It may not cover all possible information. If you have questions about this medicine, talk  to your doctor, pharmacist, or health care provider.  2022 Elsevier/Gold Standard (2021-01-06 00:00:00)

## 2021-09-13 NOTE — Progress Notes (Signed)
Bacteria in urine on microscopic. Urine culture pending.

## 2021-09-13 NOTE — Progress Notes (Signed)
Acute Office Visit  Subjective:    Patient ID: Tricia Berry, female    DOB: 11/16/1970, 50 y.o.   MRN: [id number]  Chief Complaint  Patient presents with   Urinary symptoms    Urinary Tract Infection  This is a new problem. The current episode started 1 to 4 weeks ago. The problem occurs intermittently. The problem has been waxing and waning. The quality of the pain is described as burning. There has been no fever. She is Not sexually active. There is No history of pyelonephritis. Associated symptoms include frequency, hesitancy and urgency. Pertinent negatives include no chills, discharge, flank pain, hematuria, nausea, possible pregnancy or sweats. Treatments tried: diflucan took 2 tablets. The treatment provided no relief. There is no history of catheterization, kidney stones, recurrent UTIs, a single kidney, urinary stasis or a urological procedure.   She did take Diflucan thinking she had a yeast infection, however her symptoms continue to persist onset of symptoms was over 1 week ago.  She denies any back pain or chills.  Denies any abdominal pain she has no concerns for sexually transmitted diseases.  Denies testing. Immunocompromised history of breast cancer.  She is currently on anastrozole. 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  Denies any hematuria and urine. Patient  denies any fever, body aches,chills, rash, chest pain, shortness of breath, nausea, vomiting, or diarrhea.     Past Medical History:  Diagnosis Date   Anxiety    Breast cancer ([place])    Diverticulitis    Family history of ovarian cancer    GERD (gastroesophageal reflux disease)    IBS (irritable bowel syndrome)    PONV (postoperative nausea and vomiting)    severe migraine and vomiting post anesthesia   Scoliosis     Past Surgical History:  Procedure  Laterality Date   ABDOMINAL HYSTERECTOMY     still has ovaries, no gyn cancer, hysterectomy due to endometriosis. NO cervix on exam 01/10/21   BREAST BIOPSY Left 09/15/2020   [place] bx of mass, path pending, Q marker   BREAST BIOPSY Left 09/15/2020   [place] bx of LN, hydromarker, path pending   BREAST RECONSTRUCTION WITH PLACEMENT OF TISSUE EXPANDER AND FLEX HD (ACELLULAR HYDRATED DERMIS) Bilateral 03/17/2021   Procedure: IMMEDIATE BILATERAL BREAST RECONSTRUCTION WITH PLACEMENT OF TISSUE EXPANDER AND FLEX HD (ACELLULAR HYDRATED DERMIS);  Surgeon: Wallace Going, DO;  Location: [facility];  Service: Plastics;  Laterality: Bilateral;   IR IMAGING GUIDED PORT INSERTION  10/01/2020   MODIFIED MASTECTOMY Left 03/17/2021   Procedure: LEFT MODIFIED RADICAL MASTECTOMY;  Surgeon: Erroll Luna, MD;  Location: [facility];  Service: General;  Laterality: Left;   PORTA CATH REMOVAL Right 03/17/2021   Procedure: PORTA CATH REMOVAL;  Surgeon: Erroll Luna, MD;  Location: [facility];  Service: General;  Laterality: Right;   REMOVAL OF BILATERAL TISSUE EXPANDERS WITH PLACEMENT OF BILATERAL BREAST IMPLANTS Bilateral 05/09/2021   Procedure: REMOVAL OF BILATERAL TISSUE EXPANDERS WITH PLACEMENT OF BILATERAL BREAST IMPLANTS;  Surgeon: Wallace Going, DO;  Location: [facility];  Service: Plastics;  Laterality: Bilateral;  90 min   TOTAL MASTECTOMY Right 03/17/2021   Procedure: TOTAL MASTECTOMY;  Surgeon: Erroll Luna, MD;  Location: [facility];  Service: General;  Laterality: Right;    Family History  Problem Relation Age  of Onset   Hypertension Mother    Osteoarthritis Mother    Diverticulitis Mother    Heart failure Father    Hypertension Father    Gout Father    Diverticulitis Brother    Ovarian cancer Paternal Grandmother     Social History   Socioeconomic History   Marital status: Divorced    Spouse name: Not on file    Number of children: 2   Years of education: Not on file   Highest education level: Not on file  Occupational History   Occupation: Nurse    Employer: [employer]  Tobacco Use   Smoking status: Every Day    Packs/day: 0.25    Types: Cigarettes   Smokeless tobacco: Never   Tobacco comments:    Pt trying to quit now. Has reduced amount significantly  Vaping Use   Vaping Use: Never used  Substance and Sexual Activity   Alcohol use: Not Currently   Drug use: No   Sexual activity: Not Currently    Birth control/protection: None  Other Topics Concern   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 She and her spouse are primary caregivers.    Social Determinants of Health   Financial Resource Strain: Not on file  Food Insecurity: Not on file  Transportation Needs: Not on file  Physical Activity: Not on file  Stress: Not on file  Social Connections: Not on file  Intimate Partner Violence: Not on file    Outpatient Medications Prior to Visit  Medication Sig Dispense Refill   anastrozole (ARIMIDEX) 1 MG tablet Take 1 tablet (1 mg total) by mouth daily. 30 tablet 2   cyanocobalamin (,VITAMIN B-12,) 1000 MCG/ML injection 1000 mcg (1 mL) intramuscular injection in the thigh ( vastus lateralis) once per month. 3 mL 4   Lactobacillus (PROBIOTIC ACIDOPHILUS PO) Take 1 capsule by mouth daily.     lidocaine-prilocaine (EMLA) cream Apply 1 application topically as needed. 30 g 0   LORazepam (ATIVAN) 0.5 MG tablet Take 1 tablet (0.5 mg total) by mouth at bedtime as needed for up to 30 doses for anxiety. 30 tablet 0   Multiple Vitamins-Minerals (MULTIVITAL PO) Take 1 Dose by mouth daily.     nicotine (NICODERM CQ - DOSED IN MG/24 HOURS) 21 mg/24hr patch use 1 patch daily 28 patch 0   nicotine (NICOTROL) 10 MG inhaler FOLLOW INSTRUCTIONS ON PACKAGE AS NEEDED FOR SMOKING CESSATION 168 each 0   nicotine  polacrilex (SM NICOTINE) 2 MG lozenge Take by mouth. 72 lozenge 0   ondansetron (ZOFRAN) 8 MG tablet Take 1 tablet (8 mg total) by mouth every 8 (eight) hours as needed for nausea or vomiting. 601 tablet 0   Oxycodone HCl 10 MG TABS Take 1 tablet (10 mg total) by mouth every 6 (six) hours as needed. 120 tablet 0   pregabalin (LYRICA) 75 MG capsule Take 1 capsule (75 mg total) by mouth 2 (two) times daily. 60 capsule 2   rosuvastatin (CRESTOR) 5 MG tablet Take 1 tablet (5 mg total) by mouth every evening. 90 tablet 3   tiZANidine (ZANAFLEX) 4 MG tablet Take 4 mg by mouth 3 (three) times daily.     traZODone (DESYREL) 50 MG tablet Take 0.5-1 tablets (25-50 mg total) by mouth at bedtime as needed for sleep. 30 tablet 3   DULoxetine (CYMBALTA) 30 MG capsule Take 1  capsule (30 mg total) by mouth daily. (Patient not taking: Reported on 09/05/2021) 90 capsule 3   nitrofurantoin, macrocrystal-monohydrate, (MACROBID) 100 MG capsule Take 1 capsule (100 mg total) by mouth 2 (two) times daily. Take with food. (Patient not taking: Reported on 09/05/2021) 10 capsule 0   Facility-Administered Medications Prior to Visit  Medication Dose Route Frequency Provider Last Rate Last Admin   goserelin (ZOLADEX) injection 3.6 mg  3.6 mg Subcutaneous Q28 days Sindy Guadeloupe, MD   3.6 mg at 05/04/21 1150   heparin lock flush 100 unit/mL  500 Units Intravenous Once Sindy Guadeloupe, MD        Allergies  Allergen Reactions   Morphine Nausea And Vomiting and Other (See Comments)    migranes Other reaction(s): Headache Migraine, vomiting   Sertraline Hcl     REACTION: Worsened symptoms of IBS   Sulfa Antibiotics Rash   Sulfamethoxazole Rash   Sulfonamide Derivatives Rash    Review of Systems  Constitutional:  Negative for chills.  Gastrointestinal:  Negative for nausea.  Genitourinary:  Positive for frequency, hesitancy and urgency. Negative for flank pain and hematuria.      Objective:    Physical  Exam Constitutional:      General: She is not in acute distress.    Appearance: She is not ill-appearing, toxic-appearing or diaphoretic.  HENT:     Head: Normocephalic and atraumatic.     Right Ear: External ear normal.     Left Ear: External ear normal.     Nose: Nose normal.     Mouth/Throat:     Pharynx: Oropharynx is clear.  Eyes:     Pupils: Pupils are equal, round, and reactive to light.  Cardiovascular:     Rate and Rhythm: Normal rate and regular rhythm.     Pulses: Normal pulses.     Heart sounds: Normal heart sounds. No murmur heard.   No friction rub. No gallop.  Pulmonary:     Effort: Pulmonary effort is normal. No respiratory distress.     Breath sounds: Normal breath sounds. No stridor. No wheezing, rhonchi or rales.  Chest:     Chest wall: No tenderness.  Abdominal:     General: There is no distension.     Palpations: Abdomen is soft.     Tenderness: There is no abdominal tenderness. There is no guarding.  Musculoskeletal:        General: Normal range of motion.     Cervical back: Normal range of motion and neck supple.     Right lower leg: No edema.     Left lower leg: No edema.  Skin:    General: Skin is warm.     Findings: No erythema or rash.  Neurological:     Mental Status: She is alert and oriented to person, place, and time. Mental status is at baseline.     Motor: No weakness.     Gait: Gait normal.  Psychiatric:        Mood and Affect: Mood normal.        Behavior: Behavior normal.        Thought Content: Thought content normal.    BP 112/74   Pulse 93   Temp (!) 97.1 F (36.2 C)   Ht 5' (1.524 m)   Wt 112 lb 3.2 oz (50.9 kg)   SpO2 96%   BMI 21.91 kg/m  Wt Readings from Last 3 Encounters:  09/13/21 112 lb 3.2 oz (50.9 kg)  09/05/21 119 lb 14.4 oz (54.4 kg)  09/01/21 113 lb 8 oz (51.5 kg)    Health Maintenance Due  Topic Date Due   Pneumococcal Vaccine 18-45 Years old (1 - PCV) Never done   HIV Screening  Never done   Hepatitis  C Screening  Never done   Zoster Vaccines- Shingrix (1 of 2) Never done   COLONOSCOPY (Pts 45-31yr Insurance coverage will need to be confirmed)  Never done   COVID-19 Vaccine (4 - Booster for Pfizer series) 11/22/2020    There are no preventive care reminders to display for this patient.   Lab Results  Component Value Date   TSH 1.44 01/21/2021   Lab Results  Component Value Date   WBC 5.4 09/05/2021   HGB 13.9 09/05/2021   HCT 43.1 09/05/2021   MCV 90.9 09/05/2021   PLT 240 09/05/2021   Lab Results  Component Value Date   NA 137 09/05/2021   K 4.4 09/05/2021   CO2 28 09/05/2021   GLUCOSE 146 (H) 09/05/2021   BUN 9 09/05/2021   CREATININE 0.82 09/05/2021   BILITOT 0.3 09/05/2021   ALKPHOS 98 09/05/2021   AST 21 09/05/2021   ALT 17 09/05/2021   PROT 7.3 09/05/2021   ALBUMIN 4.0 09/05/2021   CALCIUM 9.6 09/05/2021   ANIONGAP 7 09/05/2021   Lab Results  Component Value Date   CHOL 248 (H) 08/05/2021   Lab Results  Component Value Date   HDL 37.80 (L) 08/05/2021   Lab Results  Component Value Date   LDLCALC 116 (H) 12/29/2008   Lab Results  Component Value Date   TRIG 345.0 (H) 08/05/2021   Lab Results  Component Value Date   CHOLHDL 7 08/05/2021   No results found for: HGBA1C     Assessment & Plan:   Problem List Items Addressed This Visit   None Visit Diagnoses     Urinary symptom or sign    -  Primary   Relevant Medications   nitrofurantoin, macrocrystal-monohydrate, (MACROBID) 100 MG capsule   Other Relevant Orders   POCT Urinalysis Dipstick (Completed)   Urine Microscopic Only   Urine Culture   Acute cystitis with hematuria       Relevant Medications   nitrofurantoin, macrocrystal-monohydrate, (MACROBID) 100 MG capsule     Leukocytes blood and nitrates in urine today given patient's duration of symptoms and immunocompromise state we will go ahead and start Macrobid given upcoming holiday and office will be closed, urine culture is sent  for culture and sensitivity will call patient if antibiotic needs to be changed.  Discussed red flags of pyelonephritis and when to seek care immediately over the weekend should any symptoms change or worsen at any time.  Verbalized understanding.   Meds ordered this encounter  Medications   nitrofurantoin, macrocrystal-monohydrate, (MACROBID) 100 MG capsule    Sig: Take 1 capsule (100 mg total) by mouth 2 (two) times daily for 5 days.    Dispense:  10 capsule    Refill:  0    Red Flags discussed. The patient was given clear instructions to go to ER or return to medical center if any red flags develop, symptoms do not improve, worsen or new problems develop. They verbalized understanding. Advised patient call the office or your primary care doctor for an appointment if no improvement within 72 hours or if any symptoms change or worsen at any time  Advised ER or urgent Care if after hours or on weekend. Call 911  for emergency symptoms at any time.Patinet verbalized understanding of all instructions given/reviewed and treatment plan and has no further questions or concerns at this time.     Marcille Buffy, FNP

## 2021-09-13 NOTE — Progress Notes (Signed)
FYI Nitrates, blood, and leukocytes in urine, given duration and based off last culture will start Macrobid and call if needs change based on symptoms. She is immunocompromised. Red flags discussed with patient and when to seek care advised. Sent for microscopic and urine culture.  Will need repeat microscopic urine to verify resolution of bacteria and blood 1- 2 weeks after completion of medication.

## 2021-09-14 ENCOUNTER — Telehealth: Payer: Self-pay | Admitting: Family

## 2021-09-14 NOTE — Telephone Encounter (Signed)
Pt called in stating she is returning a call. Pt stated that leave a detail message on her answering machine. Pt requesting callback at [phone]

## 2021-09-15 LAB — URINE CULTURE
MICRO NUMBER:: 12669835
SPECIMEN QUALITY:: ADEQUATE

## 2021-09-19 ENCOUNTER — Other Ambulatory Visit (INDEPENDENT_AMBULATORY_CARE_PROVIDER_SITE_OTHER): Payer: No Typology Code available for payment source

## 2021-09-19 ENCOUNTER — Other Ambulatory Visit: Payer: Self-pay

## 2021-09-19 DIAGNOSIS — I700 Atherosclerosis of aorta: Secondary | ICD-10-CM | POA: Diagnosis not present

## 2021-09-19 LAB — COMPREHENSIVE METABOLIC PANEL
ALT: 12 U/L (ref 0–35)
AST: 17 U/L (ref 0–37)
Albumin: 4 g/dL (ref 3.5–5.2)
Alkaline Phosphatase: 84 U/L (ref 39–117)
BUN: 8 mg/dL (ref 6–23)
CO2: 27 mEq/L (ref 19–32)
Calcium: 9.2 mg/dL (ref 8.4–10.5)
Chloride: 104 mEq/L (ref 96–112)
Creatinine, Ser: 0.79 mg/dL (ref 0.40–1.20)
GFR: 86.64 mL/min (ref >60.00)
Glucose, Bld: 113 mg/dL — ABNORMAL HIGH (ref 70–99)
Potassium: 3.8 mEq/L (ref 3.5–5.1)
Sodium: 141 mEq/L (ref 135–145)
Total Bilirubin: 0.4 mg/dL (ref 0.2–1.2)
Total Protein: 6.4 g/dL (ref 6.0–8.3)

## 2021-09-19 NOTE — Telephone Encounter (Signed)
I called & spoke with patient's husband per DPR. He was given results of urine culture & notified that Macrobid that patient had taken was appropriate. He stated that he would let her know & she does come in office for labs today.

## 2021-09-20 ENCOUNTER — Encounter: Payer: Self-pay | Admitting: Physical Therapy

## 2021-09-20 ENCOUNTER — Ambulatory Visit: Payer: No Typology Code available for payment source | Admitting: Physical Therapy

## 2021-09-20 DIAGNOSIS — R262 Difficulty in walking, not elsewhere classified: Secondary | ICD-10-CM

## 2021-09-20 DIAGNOSIS — M545 Low back pain, unspecified: Secondary | ICD-10-CM | POA: Diagnosis not present

## 2021-09-20 DIAGNOSIS — M6281 Muscle weakness (generalized): Secondary | ICD-10-CM

## 2021-09-20 DIAGNOSIS — G8929 Other chronic pain: Secondary | ICD-10-CM

## 2021-09-20 DIAGNOSIS — M533 Sacrococcygeal disorders, not elsewhere classified: Secondary | ICD-10-CM

## 2021-09-20 NOTE — Therapy (Signed)
[facility] [address], [place], [address] Phone: [phone]   Fax:  [phone]  Physical Therapy Treatment  Patient Details  Name: Tricia Berry MRN: [id number] Date of Birth: 1971-05-26 Referring Provider (PT): Burnard Hawthorne, FNP   Encounter Date: 09/20/2021   PT End of Session - 09/20/21 0956     Visit Number 4    Number of Visits 24    Date for PT Re-Evaluation 11/17/21    Authorization Type [place] [employer] reporting period from 08/25/2021    Progress Note Due on Visit 10    PT Start Time 0950    PT Stop Time 1030    PT Time Calculation (min) 40 min    Activity Tolerance Patient tolerated treatment well    Behavior During Therapy [facility] for tasks assessed/performed             Past Medical History:  Diagnosis Date   Anxiety    Breast cancer ([place])    Diverticulitis    Family history of ovarian cancer    GERD (gastroesophageal reflux disease)    IBS (irritable bowel syndrome)    PONV (postoperative nausea and vomiting)    severe migraine and vomiting post anesthesia   Scoliosis     Past Surgical History:  Procedure Laterality Date   ABDOMINAL HYSTERECTOMY     still has ovaries, no gyn cancer, hysterectomy due to endometriosis. NO cervix on exam 01/10/21   BREAST BIOPSY Left 09/15/2020   [place] bx of mass, path pending, Q marker   BREAST BIOPSY Left 09/15/2020   [place] bx of LN, hydromarker, path pending   BREAST RECONSTRUCTION WITH PLACEMENT OF TISSUE EXPANDER AND FLEX HD (ACELLULAR HYDRATED DERMIS) Bilateral 03/17/2021   Procedure: IMMEDIATE BILATERAL BREAST RECONSTRUCTION WITH PLACEMENT OF TISSUE EXPANDER AND FLEX HD (ACELLULAR HYDRATED DERMIS);  Surgeon: Wallace Going, DO;  Location: [facility];  Service: Plastics;  Laterality: Bilateral;   IR IMAGING GUIDED PORT INSERTION  10/01/2020   MODIFIED MASTECTOMY Left 03/17/2021   Procedure: LEFT MODIFIED RADICAL  MASTECTOMY;  Surgeon: [place] Luna, MD;  Location: [facility];  Service: General;  Laterality: Left;   PORTA CATH REMOVAL Right 03/17/2021   Procedure: PORTA CATH REMOVAL;  Surgeon: [place]roll Luna, MD;  Location: [facility];  Service: General;  Laterality: Right;   REMOVAL OF BILATERAL TISSUE EXPANDERS WITH PLACEMENT OF BILATERAL BREAST IMPLANTS Bilateral 05/09/2021   Procedure: REMOVAL OF BILATERAL TISSUE EXPANDERS WITH PLACEMENT OF BILATERAL BREAST IMPLANTS;  Surgeon: Wallace Going, DO;  Location: [facility];  Service: Plastics;  Laterality: Bilateral;  90 min   TOTAL MASTECTOMY Right 03/17/2021   Procedure: TOTAL MASTECTOMY;  Surgeon: [place] Luna, MD;  Location: [facility];  Service: General;  Laterality: Right;    There were no vitals filed for this visit.   Subjective Assessment - 09/20/21 0953     Subjective Patient reports she is achy all over today at 4/10 and today the achiness is better than it has been lately. She thinks it is the medications. She has had a lot of achiness in her knees. She has to take the medication that causes achiness for 10 years. She felt fine after her last PT session for her hips. She feels her back catch in places sometimes when she lays in her back but she can adjust. She has been trying to do the exercises in bed right before  she went to sleep. She did not do it the last couple of days because she was so achy. She cannot tell a difference in her hip soreness except that there are good and bad days.    Pertinent History Patient is a 50 y.o. female who presents to outpatient physical therapy with a referral for medical diagnosis Right-sided low back pain without sciatica, unspecified chronicity. This patient's chief complaints consist of worsening chronic R sided low back pain that intermittently radiates down the leg and across the back to the left side leading to the following functional  deficits: sudden grabbing stops her in her tracks and she must sit down, stretching, bending, work responsibilities, dressing, petting cats, caring for cats, lifting, moving patients, yardwork, gardening, functional mobility, etc.  Relevant past medical history and comorbidities include L breast cancer stage IIIb (dx Nov 2021. ER+/PR+ HER2-, underwent a bilateral mastectomy and ALND on L (7/16) on 03/17/21, reconstruction in summer 2022, neuropathy of hands, lower legs and feet, completed chemo, completed radiation Dec17, 2021 to February 18, 2021, currently taking oral medication and infusions), scoliosis, internal hemorrhoids, atherosclerosis of aorta, hepatic steatosis, chemotherapy -induced neuropathy, anxiety/depression, chronic insomnia, arthralgia, B12 deficiency, GERD, IBS, diverticulitis, current smoker (trying to quit), UTIs, abdominal hysterectomy.Patient denies hx of  stroke, seizures, lung problem, major cardiac events, diabetes, changes in bowel or bladder problems, new onset stumbling or dropping things (that appears related to cancer treatments), spinal surgeries, osteoporosis (but is taking medication that can weaken bones).    Limitations House hold activities;Lifting;Standing;Walking;Other (comment)   sudden grabbing stops her in her tracks and she must sit down, stretching, bending, work responsibilities, dressing, petting cats, caring for cats, lifting, moving patients, yardwork, gardening, functional mobility, etc.   Diagnostic tests R hip xray report 08/05/2021: negative. Lumbar xray report 10/06/2021: "IMPRESSION:  Severe lumbar spine scoliosis concave right. Diffuse multilevel  degenerative change. No acute or focal bony abnormality identified." CT abdomen/pelvis report 07/18/2021: "Musculoskeletal: No acute or destructive bony lesions. Left convex  thoracolumbar scoliosis. Reconstructed images demonstrate no  additional findings. IMPRESSION:  1. Diverticulosis of the rectosigmoid colon, with  no evidence of  acute diverticulitis. Mild wall thickening of the sigmoid colon  likely reflects scarring from previous bouts of diverticulitis.  2. Normal appendix.  3. Mild hepatic steatosis.  4.  Aortic Atherosclerosis (ICD10-I70.0)."    Currently in Pain? Yes    Pain Score 4               TREATMENT:   denies latex sensitivity   Therapeutic exercise: to centralize symptoms and improve ROM, strength, muscular endurance, and activity tolerance required for successful completion of functional activities.  - NuStep level 3 using bilateral lower extremities. Seat setting 4 For improved extremity mobility, muscular endurance, and activity tolerance; and to induce the analgesic effect of aerobic exercise, stimulate improved joint nutrition, and prepare body structures and systems for following interventions. x 5 minutes. Average SPM = 59 - standing hip abduction at the hip machine, arm on level 5, 2x10 each side at 25# - sit <> stand from 17 inch chair, 2x10 - runner's step up to 6 inch step, 2x10 each side.  - Education on HEP including handout    Pt required multimodal cuing for proper technique and to facilitate improved neuromuscular control, strength, range of motion, and functional ability resulting in improved performance and form.   HOME EXERCISE PROGRAM Access Code: [email or web address] URL: [email or web address] Date: 09/20/2021 Prepared by: Rosita Kea  Exercises Supine Posterior Pelvic  Tilt - 1 x daily - 1 sets - 20 reps - 1 breath/5 seconds hold Supine March with Posterior Pelvic Tilt - 1 x daily - 2 sets - 10 reps Hooklying Clamshell with Resistance - 1 x daily - 1 sets - 20 reps - 5 seconds hold Bridge - 1 x daily - 1 sets - 20 reps - 5 seconds hold Sit to Stand Without Arm Support - 3 x weekly - 2 sets - 10 reps Diagonal Hip Extension with Resistance - 3 x weekly - 2 sets - 10 reps - 1 second hold Runner's Step Up/Down - 3 x weekly - 2 sets - 10 reps   PT Education  - 09/20/21 0956     Education Details Exercise purpose/form. Self management techniques    Person(s) Educated Patient    Methods Explanation;Demonstration;Tactile cues;Verbal cues    Comprehension Verbalized understanding;Returned demonstration;Verbal cues required;Tactile cues required;Need further instruction              PT Short Term Goals - 08/30/21 1104       PT SHORT TERM GOAL #1   Title Be independent with initial home exercise program for self-management of symptoms.    Baseline initial HEP to be provided at visit 2 as appropriate (08/25/2021); initial HEP provided at visit 2 (08/30/2021);    Time 2    Period Weeks    Status Partially Met    Target Date 09/08/21               PT Long Term Goals - 08/30/21 1101       PT LONG TERM GOAL #1   Title Be independent with a long-term home exercise program for self-management of symptoms.    Baseline initial HEP to be provided at visit 2 as appropriate (08/25/2021); initial HEP provided on visit 2 (08/30/2021);    Time 12    Period Weeks    Status On-going   TARGET DATE FOR ALL LONG TERM GOALS: 11/17/2021     PT LONG TERM GOAL #2   Title Demonstrate improved FOTO to equal or greater than 71 by visit #10 to demonstrate improvement in overall condition and self-reported functional ability.    Baseline 54 (08/25/2021);    Time 12    Period Weeks    Status On-going      PT LONG TERM GOAL #3   Title Have full lumbar AROM with no compensations outside of those expected from scoliosis or increase in pain in all planes except intermittent end range discomfort to improve ability  to complete valued activities such bending, carrying, rolling, dressing, etc.    Baseline painful and limited - see objective (08/25/2021);    Time 12    Period Weeks    Status On-going      PT LONG TERM GOAL #4   Title Improve B hip strength to equal or greater than 4+/5 with no increase in pain to improve patient's ability to complete functional  tasks such as working, dressing, walking, yardwork with less difficulty.    Baseline painful and weak - see objective (08/25/2021);    Time 12    Period Weeks    Status On-going      PT LONG TERM GOAL #5   Title Reduce pain with functional activities to equal or less than 1/10 to allow patient to complete usual activities including housework, walking, bending, sleeping with less difficulty.    Baseline up to 10/10 (08/25/2021);    Time 12  Period Weeks    Status On-going      PT LONG TERM GOAL #6   Title Complete community, work and/or recreational activities without limitation due to current condition.    Baseline sudden grabbing stops her in her tracks and she must sit down, stretching, bending, work responsibilities, dressing, petting cats, caring for cats, lifting, moving patients, yardwork, gardening, functional mobility, etc (08/25/2021);    Time 12    Period Weeks    Status On-going                   Plan - 09/20/21 1446     Clinical Impression Statement Patient tolerated treatment well overall and states she feels tired but a bit better by end of session. Session focused on progressions to more functional positioning for LE strengthening. HEP updated to include more functional activities and with plan to drop visit frequency to 1x/week. Patient fatigues quickly. Patient would benefit from continued management of limiting condition by skilled physical therapist to address remaining impairments and functional limitations to work towards stated goals and return to PLOF or maximal functional independence.    Personal Factors and Comorbidities Comorbidity 3+;Past/Current Experience;Profession;Fitness;Social Background;Time since onset of injury/illness/exacerbation    Comorbidities Relevant past medical history and comorbidities include L breast cancer stage IIIb (dx Nov 2021. ER+/PR+ HER2-, underwent a bilateral mastectomy and ALND on L (7/16) on 03/17/21, reconstruction in  summer 2022, neuropathy of hands, lower legs and feet, completed chemo, completed radiation Dec17, 2021 to February 18, 2021, currently taking oral medication and infusions), scoliosis, internal hemorrhoids, atherosclerosis of aorta, hepatic steatosis, chemotherapy -induced neuropathy, anxiety/depression, chronic insomnia, arthralgia, B12 deficiency, GERD, IBS, diverticulitis, current smoker (trying to quit), UTIs, abdominal hysterectomy    Examination-Activity Limitations Bed Mobility;Lift;Squat;Bend;Locomotion Level;Stand;Caring for Others;Carry;Transfers;Sleep;Dressing    Examination-Participation Restrictions Laundry;Shop;Cleaning;Community Activity;Meal Prep;Occupation;Yard Work;Interpersonal Relationship;Other   sudden grabbing stops her in her tracks and she must sit down, stretching, bending, work responsibilities, dressing, petting cats, caring for cats, lifting, moving patients, yardwork, gardening, functional mobility, etc.   Stability/Clinical Decision Making Evolving/Moderate complexity    Rehab Potential Good    PT Frequency 2x / week    PT Duration 12 weeks    PT Treatment/Interventions ADLs/Self Care Home Management;Aquatic Therapy;Cryotherapy;Gait training;Stair training;Functional mobility training;Therapeutic activities;Therapeutic exercise;Balance training;Neuromuscular re-education;Manual techniques;Dry needling;Energy conservation;Patient/family education    PT Next Visit Plan core/functional/LE strengthening as tolerated    PT Home Exercise Plan Medbridge Access Code: [email or web address]    Consulted and Agree with Plan of Care Patient             Patient will benefit from skilled therapeutic intervention in order to improve the following deficits and impairments:  Abnormal gait, Impaired sensation, Improper body mechanics, Pain, Postural dysfunction, Hypermobility, Decreased activity tolerance, Decreased endurance, Decreased range of motion, Decreased strength, Impaired perceived  functional ability, Difficulty walking, Decreased balance  Visit Diagnosis: Chronic right-sided low back pain, unspecified whether sciatica present  Sacrococcygeal disorders, not elsewhere classified  Muscle weakness (generalized)  Difficulty in walking, not elsewhere classified     Problem List Patient Active Problem List   Diagnosis Date Noted   Acquired absence of breast 08/16/2021   Dysuria 08/05/2021   B12 deficiency 07/21/2021   Atherosclerosis of aorta ([place]) 07/20/2021   Hepatic steatosis 07/20/2021   Abdominal pain 07/18/2021   Breast cancer ([place]) 03/17/2021   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[place]) 03/14/2021   Genetic testing 11/22/2020   Family history of ovarian cancer    Candidal vulvovaginitis 11/01/2020   Low  back pain 10/06/2020   Encounter for medical examination to establish care 09/26/2020   Malignant neoplasm of upper-outer quadrant of left breast in female, estrogen receptor positive ([place]) 09/26/2020   BACK STRAIN, LUMBAR 03/08/2010   INTERNAL HEMORRHOIDS 01/27/2010   ANAL FISSURE 01/27/2010   OSTEOARTHRITIS, HANDS, BILATERAL 01/04/2010   ARTHRALGIA 10/05/2009   INSOMNIA, CHRONIC 05/06/2009   ALLERGIC RHINITIS, SEASONAL 02/09/2009   TOBACCO ABUSE 12/29/2008   IBS 10/13/2008   Anxiety state 08/25/2008   DEPRESSION, RECURRENT 08/25/2008    Everlean Alstrom. Graylon Good, PT, DPT 09/20/21, 2:48 PM    [facility] [address] [place], [address] Phone: [phone]   Fax:  [phone]  Name: Tricia Berry MRN: [id number] Date of Birth: 10-Feb-1971

## 2021-09-22 ENCOUNTER — Other Ambulatory Visit: Payer: Self-pay | Admitting: Oncology

## 2021-09-22 ENCOUNTER — Ambulatory Visit: Payer: No Typology Code available for payment source | Admitting: Physical Therapy

## 2021-09-22 ENCOUNTER — Other Ambulatory Visit: Payer: Self-pay

## 2021-09-22 MED ORDER — LORAZEPAM 0.5 MG PO TABS
0.5000 mg | ORAL_TABLET | Freq: Every evening | ORAL | 0 refills | Status: DC | PRN
  Filled 2021-09-22: qty 30, 30d supply, fill #0

## 2021-09-23 ENCOUNTER — Encounter: Payer: Self-pay | Admitting: Oncology

## 2021-09-23 ENCOUNTER — Inpatient Hospital Stay: Payer: No Typology Code available for payment source | Attending: Oncology

## 2021-09-23 ENCOUNTER — Other Ambulatory Visit: Payer: Self-pay

## 2021-09-23 DIAGNOSIS — C50412 Malignant neoplasm of upper-outer quadrant of left female breast: Secondary | ICD-10-CM | POA: Diagnosis present

## 2021-09-23 DIAGNOSIS — Z170 Estrogen receptor positive status [ER+]: Secondary | ICD-10-CM

## 2021-09-23 DIAGNOSIS — Z79899 Other long term (current) drug therapy: Secondary | ICD-10-CM | POA: Insufficient documentation

## 2021-09-23 DIAGNOSIS — Z5112 Encounter for antineoplastic immunotherapy: Secondary | ICD-10-CM | POA: Diagnosis present

## 2021-09-23 DIAGNOSIS — Z5111 Encounter for antineoplastic chemotherapy: Secondary | ICD-10-CM | POA: Diagnosis present

## 2021-09-23 MED ORDER — GOSERELIN ACETATE 3.6 MG ~~LOC~~ IMPL
3.6000 mg | DRUG_IMPLANT | Freq: Once | SUBCUTANEOUS | Status: AC
  Administered 2021-09-23: 3.6 mg via SUBCUTANEOUS
  Filled 2021-09-23: qty 3.6

## 2021-09-23 MED FILL — Oxycodone HCl Tab 10 MG: ORAL | 30 days supply | Qty: 120 | Fill #0 | Status: AC

## 2021-09-26 ENCOUNTER — Other Ambulatory Visit: Payer: Self-pay

## 2021-09-26 ENCOUNTER — Inpatient Hospital Stay: Payer: No Typology Code available for payment source

## 2021-09-26 ENCOUNTER — Other Ambulatory Visit: Payer: Self-pay | Admitting: Student

## 2021-09-26 VITALS — BP 111/87 | HR 77 | Temp 98.9°F | Resp 16 | Wt 113.6 lb

## 2021-09-26 DIAGNOSIS — Z5111 Encounter for antineoplastic chemotherapy: Secondary | ICD-10-CM | POA: Diagnosis not present

## 2021-09-26 DIAGNOSIS — C50412 Malignant neoplasm of upper-outer quadrant of left female breast: Secondary | ICD-10-CM

## 2021-09-26 DIAGNOSIS — M2170 Unequal limb length (acquired), unspecified site: Secondary | ICD-10-CM

## 2021-09-26 MED ORDER — DIPHENHYDRAMINE HCL 25 MG PO CAPS
50.0000 mg | ORAL_CAPSULE | Freq: Once | ORAL | Status: AC
  Administered 2021-09-26: 50 mg via ORAL
  Filled 2021-09-26: qty 2

## 2021-09-26 MED ORDER — PERTUZ-TRASTUZ-HYALURON-ZZXF 60-60-2000 MG-MG-U/ML CHEMO ~~LOC~~ SOLN
10.0000 mL | Freq: Once | SUBCUTANEOUS | Status: AC
  Administered 2021-09-26: 10 mL via SUBCUTANEOUS
  Filled 2021-09-26: qty 10

## 2021-09-26 MED ORDER — ACETAMINOPHEN 325 MG PO TABS
650.0000 mg | ORAL_TABLET | Freq: Once | ORAL | Status: AC
  Administered 2021-09-26: 650 mg via ORAL
  Filled 2021-09-26: qty 2

## 2021-09-26 NOTE — Patient Instructions (Signed)
[facility] [place]  Discharge Instructions: Thank you for choosing [facility] to provide your oncology and hematology care.   If you have a lab appointment with the [facility], please go directly to the [facility] and check in at the registration area.   Wear comfortable clothing and clothing appropriate for easy access to any Portacath or PICC line.   We strive to give you quality time with your provider. You may need to reschedule your appointment if you arrive late (15 or more minutes).  Arriving late affects you and other patients whose appointments are after yours.  Also, if you miss three or more appointments without notifying the office, you may be dismissed from the clinic at the provider's discretion.      For prescription refill requests, have your pharmacy contact our office and allow 72 hours for refills to be completed.    Today you received the following chemotherapy and/or immunotherapy agents: [place].      To help prevent nausea and vomiting after your treatment, we encourage you to take your nausea medication as directed.  BELOW ARE SYMPTOMS THAT SHOULD BE REPORTED IMMEDIATELY: *FEVER GREATER THAN 100.4 F (38 C) OR HIGHER *CHILLS OR SWEATING *NAUSEA AND VOMITING THAT IS NOT CONTROLLED WITH YOUR NAUSEA MEDICATION *UNUSUAL SHORTNESS OF BREATH *UNUSUAL BRUISING OR BLEEDING *URINARY PROBLEMS (pain or burning when urinating, or frequent urination) *BOWEL PROBLEMS (unusual diarrhea, constipation, pain near the anus) TENDERNESS IN MOUTH AND THROAT WITH OR WITHOUT PRESENCE OF ULCERS (sore throat, sores in mouth, or a toothache) UNUSUAL RASH, SWELLING OR PAIN  UNUSUAL VAGINAL DISCHARGE OR ITCHING   Items with * indicate a potential emergency and should be followed up as soon as possible or go to the Emergency Department if any problems should occur.  Please show the CHEMOTHERAPY ALERT CARD or IMMUNOTHERAPY ALERT CARD at check-in  to the Emergency Department and triage nurse.  Should you have questions after your visit or need to cancel or reschedule your appointment, please contact [facility]  Dept: [phone]  and follow the prompts.  Office hours are 8:00 a.m. to 4:30 p.m. Monday - Friday. Please note that voicemails left after 4:00 p.m. may not be returned until the following business day.  We are closed weekends and major holidays. You have access to a nurse at all times for urgent questions. Please call the main number to the clinic Dept: [phone] and follow the prompts.   For any non-urgent questions, you may also contact your provider using MyChart. We now offer e-Visits for anyone 64 and older to request care online for non-urgent symptoms. For details visit [email or web address].   Also download the MyChart app! Go to the app store, search "MyChart", open the app, select [facility], and log in with your MyChart username and password.  Due to Covid, a mask is required upon entering the hospital/clinic. If you do not have a mask, one will be given to you upon arrival. For doctor visits, patients may have 1 support person aged 29 or older with them. For treatment visits, patients cannot have anyone with them due to current Covid guidelines and our immunocompromised population.

## 2021-09-27 ENCOUNTER — Ambulatory Visit: Payer: No Typology Code available for payment source | Attending: Family | Admitting: Physical Therapy

## 2021-09-27 ENCOUNTER — Encounter: Payer: Self-pay | Admitting: Oncology

## 2021-09-27 ENCOUNTER — Ambulatory Visit
Admission: RE | Admit: 2021-09-27 | Discharge: 2021-09-27 | Disposition: A | Discharge status: Home / Self Care | Payer: No Typology Code available for payment source | Source: Ambulatory Visit | Attending: Student | Admitting: Student

## 2021-09-27 ENCOUNTER — Other Ambulatory Visit: Payer: Self-pay

## 2021-09-27 ENCOUNTER — Ambulatory Visit
Admission: RE | Admit: 2021-09-27 | Discharge: 2021-09-27 | Disposition: A | Discharge status: Home / Self Care | Payer: No Typology Code available for payment source | Attending: Student | Admitting: Student

## 2021-09-27 ENCOUNTER — Encounter: Payer: Self-pay | Admitting: Physical Therapy

## 2021-09-27 DIAGNOSIS — M2170 Unequal limb length (acquired), unspecified site: Secondary | ICD-10-CM | POA: Diagnosis present

## 2021-09-27 DIAGNOSIS — M545 Low back pain, unspecified: Secondary | ICD-10-CM

## 2021-09-27 DIAGNOSIS — G8929 Other chronic pain: Secondary | ICD-10-CM

## 2021-09-27 DIAGNOSIS — R262 Difficulty in walking, not elsewhere classified: Secondary | ICD-10-CM

## 2021-09-27 DIAGNOSIS — M6281 Muscle weakness (generalized): Secondary | ICD-10-CM

## 2021-09-27 DIAGNOSIS — M533 Sacrococcygeal disorders, not elsewhere classified: Secondary | ICD-10-CM

## 2021-09-27 MED ORDER — ONDANSETRON HCL 8 MG PO TABS
8.0000 mg | ORAL_TABLET | Freq: Three times a day (TID) | ORAL | 0 refills | Status: DC | PRN
Start: 2021-09-27 — End: 2021-10-24
  Filled 2021-09-27: qty 60, 20d supply, fill #0

## 2021-09-27 NOTE — Therapy (Signed)
[facility] [address], [place], [address] Phone: [phone]   Fax:  [phone]  Physical Therapy Treatment  Patient Details  Name: Tricia Berry MRN: [id number] Date of Birth: Jul 01, 1971 Referring Provider (PT): Burnard Hawthorne, FNP   Encounter Date: 09/27/2021   PT End of Session - 09/27/21 0949     Visit Number 5    Number of Visits 24    Date for PT Re-Evaluation 11/17/21    Authorization Type [place] [employer] reporting period from 08/25/2021    Progress Note Due on Visit 10    PT Start Time 0945    PT Stop Time 1025    PT Time Calculation (min) 40 min    Activity Tolerance Patient tolerated treatment well    Behavior During Therapy [facility] for tasks assessed/performed             Past Medical History:  Diagnosis Date   Anxiety    Breast cancer ([place])    Diverticulitis    Family history of ovarian cancer    GERD (gastroesophageal reflux disease)    IBS (irritable bowel syndrome)    PONV (postoperative nausea and vomiting)    severe migraine and vomiting post anesthesia   Scoliosis     Past Surgical History:  Procedure Laterality Date   ABDOMINAL HYSTERECTOMY     still has ovaries, no gyn cancer, hysterectomy due to endometriosis. NO cervix on exam 01/10/21   BREAST BIOPSY Left 09/15/2020   [place] bx of mass, path pending, Q marker   BREAST BIOPSY Left 09/15/2020   [place] bx of LN, hydromarker, path pending   BREAST RECONSTRUCTION WITH PLACEMENT OF TISSUE EXPANDER AND FLEX HD (ACELLULAR HYDRATED DERMIS) Bilateral 03/17/2021   Procedure: IMMEDIATE BILATERAL BREAST RECONSTRUCTION WITH PLACEMENT OF TISSUE EXPANDER AND FLEX HD (ACELLULAR HYDRATED DERMIS);  Surgeon: Wallace Going, DO;  Location: [facility];  Service: Plastics;  Laterality: Bilateral;   IR IMAGING GUIDED PORT INSERTION  10/01/2020   MODIFIED MASTECTOMY Left 03/17/2021   Procedure: LEFT MODIFIED RADICAL  MASTECTOMY;  Surgeon: Erroll Luna, MD;  Location: [facility];  Service: General;  Laterality: Left;   PORTA CATH REMOVAL Right 03/17/2021   Procedure: PORTA CATH REMOVAL;  Surgeon: Erroll Luna, MD;  Location: [facility] [place];  Service: General;  Laterality: Right;   REMOVAL OF BILATERAL TISSUE EXPANDERS WITH PLACEMENT OF BILATERAL BREAST IMPLANTS Bilateral 05/09/2021   Procedure: REMOVAL OF BILATERAL TISSUE EXPANDERS WITH PLACEMENT OF BILATERAL BREAST IMPLANTS;  Surgeon: Wallace Going, DO;  Location: [facility];  Service: Plastics;  Laterality: Bilateral;  90 min   TOTAL MASTECTOMY Right 03/17/2021   Procedure: TOTAL MASTECTOMY;  Surgeon: Erroll Luna, MD;  Location: [facility];  Service: General;  Laterality: Right;    There were no vitals filed for this visit.   Subjective Assessment - 09/27/21 0945     Subjective Patient reports she is feeling a little worn out after having her injection yesterday. She is usually fatigued for about 4 days after these treatments. She reports she felt okay after last PT session. Patient reports she has been doing her HEP at least every other day depending on her other appointments. She slept all day after her injection yesterday. She felt like she recovered okay from her last PT session. She has been doing her step ups. No questions or concerns about any of her exercises. She has all  over achiness but no specific pain. Achiness is tolerable.    Pertinent History Patient is a 50 y.o. female who presents to outpatient physical therapy with a referral for medical diagnosis Right-sided low back pain without sciatica, unspecified chronicity. This patient's chief complaints consist of worsening chronic R sided low back pain that intermittently radiates down the leg and across the back to the left side leading to the following functional deficits: sudden grabbing stops her in her tracks and she must sit  down, stretching, bending, work responsibilities, dressing, petting cats, caring for cats, lifting, moving patients, yardwork, gardening, functional mobility, etc.  Relevant past medical history and comorbidities include L breast cancer stage IIIb (dx Nov 2021. ER+/PR+ HER2-, underwent a bilateral mastectomy and ALND on L (7/16) on 03/17/21, reconstruction in summer 2022, neuropathy of hands, lower legs and feet, completed chemo, completed radiation Dec17, 2021 to February 18, 2021, currently taking oral medication and infusions), scoliosis, internal hemorrhoids, atherosclerosis of aorta, hepatic steatosis, chemotherapy -induced neuropathy, anxiety/depression, chronic insomnia, arthralgia, B12 deficiency, GERD, IBS, diverticulitis, current smoker (trying to quit), UTIs, abdominal hysterectomy.Patient denies hx of  stroke, seizures, lung problem, major cardiac events, diabetes, changes in bowel or bladder problems, new onset stumbling or dropping things (that appears related to cancer treatments), spinal surgeries, osteoporosis (but is taking medication that can weaken bones).    Limitations House hold activities;Lifting;Standing;Walking;Other (comment)   sudden grabbing stops her in her tracks and she must sit down, stretching, bending, work responsibilities, dressing, petting cats, caring for cats, lifting, moving patients, yardwork, gardening, functional mobility, etc.   Diagnostic tests R hip xray report 08/05/2021: negative. Lumbar xray report 10/06/2021: "IMPRESSION:  Severe lumbar spine scoliosis concave right. Diffuse multilevel  degenerative change. No acute or focal bony abnormality identified." CT abdomen/pelvis report 07/18/2021: "Musculoskeletal: No acute or destructive bony lesions. Left convex  thoracolumbar scoliosis. Reconstructed images demonstrate no  additional findings. IMPRESSION:  1. Diverticulosis of the rectosigmoid colon, with no evidence of  acute diverticulitis. Mild wall thickening of the  sigmoid colon  likely reflects scarring from previous bouts of diverticulitis.  2. Normal appendix.  3. Mild hepatic steatosis.  4.  Aortic Atherosclerosis (ICD10-I70.0)."    Currently in Pain? No/denies             OBJECTIVE  SELF-REPORTED FUNCTION FOTO score: 53/100 (lumbar questionnaire)   TREATMENT:   denies latex sensitivity   Therapeutic exercise: to centralize symptoms and improve ROM, strength, muscular endurance, and activity tolerance required for successful completion of functional activities.  - NuStep level 4 using bilateral lower extremities. Seat setting 4 For improved extremity mobility, muscular endurance, and activity tolerance; and to induce the analgesic effect of aerobic exercise, stimulate improved joint nutrition, and prepare body structures and systems for following interventions. x 6:51 minutes. Average SPM = 73 - standing hip abduction at the hip machine, arm on level 5, 3x10 each side at 25#. - sit <> stand from 17 inch chair, holding 3#DB at chest with both hands, 3x10 - runner's step up to 8.5 inch step, 3x10 each side U UE support   Pt required multimodal cuing for proper technique and to facilitate improved neuromuscular control, strength, range of motion, and functional ability resulting in improved performance and form.   HOME EXERCISE PROGRAM Access Code: [email or web address] URL: [email or web address] Date: 09/20/2021 Prepared by: Rosita Kea   Exercises Supine Posterior Pelvic Tilt - 1 x daily - 1 sets - 20 reps - 1 breath/5 seconds hold Supine March with Posterior Pelvic  Tilt - 1 x daily - 2 sets - 10 reps Hooklying Clamshell with Resistance - 1 x daily - 1 sets - 20 reps - 5 seconds hold Bridge - 1 x daily - 1 sets - 20 reps - 5 seconds hold Sit to Stand Without Arm Support - 3 x weekly - 2 sets - 10 reps Diagonal Hip Extension with Resistance - 3 x weekly - 2 sets - 10 reps - 1 second hold Runner's Step Up/Down - 3 x weekly - 2 sets - 10  reps    PT Education - 09/27/21 0949     Education Details Exercise purpose/form. Self management techniques    Person(s) Educated Patient    Methods Explanation;Demonstration;Tactile cues;Verbal cues    Comprehension Verbalized understanding;Returned demonstration;Verbal cues required;Tactile cues required;Need further instruction              PT Short Term Goals - 09/27/21 0957       PT SHORT TERM GOAL #1   Title Be independent with initial home exercise program for self-management of symptoms.    Baseline initial HEP to be provided at visit 2 as appropriate (08/25/2021); initial HEP provided at visit 2 (08/30/2021);    Time 2    Period Weeks    Status Achieved    Target Date 09/08/21               PT Long Term Goals - 08/30/21 1101       PT LONG TERM GOAL #1   Title Be independent with a long-term home exercise program for self-management of symptoms.    Baseline initial HEP to be provided at visit 2 as appropriate (08/25/2021); initial HEP provided on visit 2 (08/30/2021);    Time 12    Period Weeks    Status On-going   TARGET DATE FOR ALL LONG TERM GOALS: 11/17/2021     PT LONG TERM GOAL #2   Title Demonstrate improved FOTO to equal or greater than 71 by visit #10 to demonstrate improvement in overall condition and self-reported functional ability.    Baseline 54 (08/25/2021);    Time 12    Period Weeks    Status On-going      PT LONG TERM GOAL #3   Title Have full lumbar AROM with no compensations outside of those expected from scoliosis or increase in pain in all planes except intermittent end range discomfort to improve ability  to complete valued activities such bending, carrying, rolling, dressing, etc.    Baseline painful and limited - see objective (08/25/2021);    Time 12    Period Weeks    Status On-going      PT LONG TERM GOAL #4   Title Improve B hip strength to equal or greater than 4+/5 with no increase in pain to improve patient's ability to  complete functional tasks such as working, dressing, walking, yardwork with less difficulty.    Baseline painful and weak - see objective (08/25/2021);    Time 12    Period Weeks    Status On-going      PT LONG TERM GOAL #5   Title Reduce pain with functional activities to equal or less than 1/10 to allow patient to complete usual activities including housework, walking, bending, sleeping with less difficulty.    Baseline up to 10/10 (08/25/2021);    Time 12    Period Weeks    Status On-going      PT LONG TERM GOAL #6   Title  Complete community, work and/or recreational activities without limitation due to current condition.    Baseline sudden grabbing stops her in her tracks and she must sit down, stretching, bending, work responsibilities, dressing, petting cats, caring for cats, lifting, moving patients, yardwork, gardening, functional mobility, etc (08/25/2021);    Time 12    Period Weeks    Status On-going                   Plan - 09/27/21 0957     Clinical Impression Statement Patient tolerated treatment well overall and was able to progress to 3 sets of each exercise. Continues to fatigue quickly but demonstrated good recovery after last session suggesting readiness to increase volume. Patient would benefit from continued management of limiting condition by skilled physical therapist to address remaining impairments and functional limitations to work towards stated goals and return to PLOF or maximal functional independence.    Personal Factors and Comorbidities Comorbidity 3+;Past/Current Experience;Profession;Fitness;Social Background;Time since onset of injury/illness/exacerbation    Comorbidities Relevant past medical history and comorbidities include L breast cancer stage IIIb (dx Nov 2021. ER+/PR+ HER2-, underwent a bilateral mastectomy and ALND on L (7/16) on 03/17/21, reconstruction in summer 2022, neuropathy of hands, lower legs and feet, completed chemo, completed  radiation Dec17, 2021 to February 18, 2021, currently taking oral medication and infusions), scoliosis, internal hemorrhoids, atherosclerosis of aorta, hepatic steatosis, chemotherapy -induced neuropathy, anxiety/depression, chronic insomnia, arthralgia, B12 deficiency, GERD, IBS, diverticulitis, current smoker (trying to quit), UTIs, abdominal hysterectomy    Examination-Activity Limitations Bed Mobility;Lift;Squat;Bend;Locomotion Level;Stand;Caring for Others;Carry;Transfers;Sleep;Dressing    Examination-Participation Restrictions Laundry;Shop;Cleaning;Community Activity;Meal Prep;Occupation;Yard Work;Interpersonal Relationship;Other   sudden grabbing stops her in her tracks and she must sit down, stretching, bending, work responsibilities, dressing, petting cats, caring for cats, lifting, moving patients, yardwork, gardening, functional mobility, etc.   Stability/Clinical Decision Making Evolving/Moderate complexity    Rehab Potential Good    PT Frequency 2x / week    PT Duration 12 weeks    PT Treatment/Interventions ADLs/Self Care Home Management;Aquatic Therapy;Cryotherapy;Gait training;Stair training;Functional mobility training;Therapeutic activities;Therapeutic exercise;Balance training;Neuromuscular re-education;Manual techniques;Dry needling;Energy conservation;Patient/family education    PT Next Visit Plan core/functional/LE strengthening as tolerated    PT Home Exercise Plan Medbridge Access Code: [email or web address]    Consulted and Agree with Plan of Care Patient             Patient will benefit from skilled therapeutic intervention in order to improve the following deficits and impairments:  Abnormal gait, Impaired sensation, Improper body mechanics, Pain, Postural dysfunction, Hypermobility, Decreased activity tolerance, Decreased endurance, Decreased range of motion, Decreased strength, Impaired perceived functional ability, Difficulty walking, Decreased balance  Visit Diagnosis: Chronic  right-sided low back pain, unspecified whether sciatica present  Sacrococcygeal disorders, not elsewhere classified  Muscle weakness (generalized)  Difficulty in walking, not elsewhere classified     Problem List Patient Active Problem List   Diagnosis Date Noted   Acquired absence of breast 08/16/2021   Dysuria 08/05/2021   B12 deficiency 07/21/2021   Atherosclerosis of aorta ([place]) 07/20/2021   Hepatic steatosis 07/20/2021   Abdominal pain 07/18/2021   Breast cancer ([place]) 03/17/2021   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[place]) 03/14/2021   Genetic testing 11/22/2020   Family history of ovarian cancer    Candidal vulvovaginitis 11/01/2020   Low back pain 10/06/2020   Encounter for medical examination to establish care 09/26/2020   Malignant neoplasm of upper-outer quadrant of left breast in female, estrogen receptor positive ([place]) 09/26/2020   BACK STRAIN, LUMBAR 03/08/2010  INTERNAL HEMORRHOIDS 01/27/2010   ANAL FISSURE 01/27/2010   OSTEOARTHRITIS, HANDS, BILATERAL 01/04/2010   ARTHRALGIA 10/05/2009   INSOMNIA, CHRONIC 05/06/2009   ALLERGIC RHINITIS, SEASONAL 02/09/2009   TOBACCO ABUSE 12/29/2008   IBS 10/13/2008   Anxiety state 08/25/2008   DEPRESSION, RECURRENT 08/25/2008    Everlean Alstrom. Graylon Good, PT, DPT 09/27/21, 9:59 AM   [facility] [address], [place], [address] Phone: [phone]   Fax:  [phone]  Name: MYALEE STENGEL MRN: [id number] Date of Birth: 1971/02/06

## 2021-09-28 ENCOUNTER — Other Ambulatory Visit: Payer: Self-pay

## 2021-09-28 ENCOUNTER — Other Ambulatory Visit: Payer: Self-pay | Admitting: Pharmacist

## 2021-09-28 ENCOUNTER — Telehealth: Payer: Self-pay | Admitting: Family

## 2021-09-28 ENCOUNTER — Other Ambulatory Visit (INDEPENDENT_AMBULATORY_CARE_PROVIDER_SITE_OTHER): Payer: No Typology Code available for payment source

## 2021-09-28 DIAGNOSIS — R300 Dysuria: Secondary | ICD-10-CM

## 2021-09-28 DIAGNOSIS — N3000 Acute cystitis without hematuria: Secondary | ICD-10-CM

## 2021-09-28 MED ORDER — NITROFURANTOIN MONOHYD MACRO 100 MG PO CAPS: 100.0000 mg | ORAL_CAPSULE | Freq: Two times a day (BID) | ORAL | 0 refills | Status: DC

## 2021-09-28 NOTE — Telephone Encounter (Signed)
Pt called in stating that she recently saw NP Flinchum on 11/22/222 for UTI. Pt stated that the medication (nitrofurantoin, macrocrystal-monohydrate, (MACROBID) 100 MG capsule)  NP Flinchum prescribed  to Pt, relieved Pt symptoms. Pt is now calling in stating that her UTI symptoms have returned. Pt was wondering do she need to make an appointment or can someone prescribe her something stronger. Pt stated her symptoms are burning when urinating, discomfort, pain, and urgency to use bathroom. Pt is requesting a callback at home number.

## 2021-09-28 NOTE — Telephone Encounter (Signed)
Pt asked if you would send in Macrobid bc she is afraid of the cipro with the black box warning. She knows that the culture will not be back by Friday & does not want to go the weekend. She also said that she didn't feel the need to consult urology just bc she is on the arimidex that puts her at higher risk for UTI. She also has been out of the replense? A vaginal lubricant that is quite expensive. Are you willing to send ahead of culture results?

## 2021-09-28 NOTE — Telephone Encounter (Signed)
FYI Tricia Berry  Sarah, call pt  Advised that I would collect urine specimen ahead of prescribing another antibiotic is Macrobid was appropriate for e coli to see if infection is still present.  Please order UA, culture and have her come today.   However I understand  patient symptoms are severe and if she prefers I can send in ciprofloxacin ( another antibiotic) ahead of urine results however this can put her at risk for severe diarrheal infection. Cipro also carries a black box warning as a relates to Achilles tendon rupture which is irreversible.  If she develops leg pain on medication, she has to stop immediately.  Ensure to take probiotics while on antibiotics and also for 2 weeks after completion. This can either be by eating yogurt daily or taking a probiotic supplement over the counter such as Culturelle.It is important to re-colonize the gut with good bacteria and also to prevent any diarrheal infections associated with antibiotic use.   Lastly, I would advise consult with urology due to frequent nature of urinary tract infections. Please place referral if agreeable.   Let me know after you speak with  her and Ill send in cipro

## 2021-09-28 NOTE — Telephone Encounter (Signed)
Pt scheduled to leave urine today & was willing to have referral placed. I will place for patient.

## 2021-09-28 NOTE — Telephone Encounter (Signed)
Call patient I have sent in [place] for second dose.  Please advise that is she si on  Arimidex for period time, she is likely to have recurrent UTIs.  The benefit of seeing urology is often they will place patients on a low-dose antibiotics for a period of time to reduce reoccurrence of UTIs.  Please again ask her to consider seeing urology  Sch urine per prior note as well

## 2021-09-29 ENCOUNTER — Encounter: Payer: No Typology Code available for payment source | Admitting: Physical Therapy

## 2021-09-29 ENCOUNTER — Encounter: Payer: Self-pay | Admitting: Physical Therapy

## 2021-09-29 ENCOUNTER — Ambulatory Visit: Payer: No Typology Code available for payment source | Attending: Surgery | Admitting: Physical Therapy

## 2021-09-29 DIAGNOSIS — C50912 Malignant neoplasm of unspecified site of left female breast: Secondary | ICD-10-CM | POA: Insufficient documentation

## 2021-09-29 DIAGNOSIS — Z483 Aftercare following surgery for neoplasm: Secondary | ICD-10-CM | POA: Diagnosis present

## 2021-09-29 DIAGNOSIS — Z170 Estrogen receptor positive status [ER+]: Secondary | ICD-10-CM | POA: Insufficient documentation

## 2021-09-29 DIAGNOSIS — L599 Disorder of the skin and subcutaneous tissue related to radiation, unspecified: Secondary | ICD-10-CM | POA: Insufficient documentation

## 2021-09-29 DIAGNOSIS — R293 Abnormal posture: Secondary | ICD-10-CM | POA: Insufficient documentation

## 2021-09-29 DIAGNOSIS — M25612 Stiffness of left shoulder, not elsewhere classified: Secondary | ICD-10-CM | POA: Insufficient documentation

## 2021-09-29 LAB — URINALYSIS, ROUTINE W REFLEX MICROSCOPIC
Bilirubin Urine: NEGATIVE
Ketones, ur: NEGATIVE
Nitrite: POSITIVE — AB
Specific Gravity, Urine: <=1.005 — AB (ref 1.000–1.030)
Total Protein, Urine: NEGATIVE
Urine Glucose: NEGATIVE
Urobilinogen, UA: 0.2 (ref 0.0–1.0)
pH: 6 (ref 5.0–8.0)

## 2021-09-29 NOTE — Therapy (Signed)
[facility] @ [place], [place], [address] Phone: [phone]   Fax:  [phone]  Physical Therapy Treatment  Patient Details  Name: Tricia Berry MRN: [id number] Date of Birth: 1971/09/04 Referring Provider (PT): Burnard Hawthorne, FNP   Encounter Date: 09/29/2021   PT End of Session - 09/29/21 0001     Visit Number 12    Number of Visits 20    Date for PT Re-Evaluation 10/27/21    PT Start Time 0907    PT Stop Time 0955    PT Time Calculation (min) 48 min    Activity Tolerance Patient tolerated treatment well    Behavior During Therapy [facility] for tasks assessed/performed             Past Medical History:  Diagnosis Date   Anxiety    Breast cancer ([place])    Diverticulitis    Family history of ovarian cancer    GERD (gastroesophageal reflux disease)    IBS (irritable bowel syndrome)    PONV (postoperative nausea and vomiting)    severe migraine and vomiting post anesthesia   Scoliosis     Past Surgical History:  Procedure Laterality Date   ABDOMINAL HYSTERECTOMY     still has ovaries, no gyn cancer, hysterectomy due to endometriosis. NO cervix on exam 01/10/21   BREAST BIOPSY Left 09/15/2020   [place] bx of mass, path pending, Q marker   BREAST BIOPSY Left 09/15/2020   [place] bx of LN, hydromarker, path pending   BREAST RECONSTRUCTION WITH PLACEMENT OF TISSUE EXPANDER AND FLEX HD (ACELLULAR HYDRATED DERMIS) Bilateral 03/17/2021   Procedure: IMMEDIATE BILATERAL BREAST RECONSTRUCTION WITH PLACEMENT OF TISSUE EXPANDER AND FLEX HD (ACELLULAR HYDRATED DERMIS);  Surgeon: Wallace Going, DO;  Location: [place];  Service: Plastics;  Laterality: Bilateral;   IR IMAGING GUIDED PORT INSERTION  10/01/2020   MODIFIED MASTECTOMY Left 03/17/2021   Procedure: LEFT MODIFIED RADICAL MASTECTOMY;  Surgeon: Erroll Luna, MD;  Location: [place];  Service: General;  Laterality:  Left;   PORTA CATH REMOVAL Right 03/17/2021   Procedure: PORTA CATH REMOVAL;  Surgeon: Erroll Luna, MD;  Location: [place];  Service: General;  Laterality: Right;   REMOVAL OF BILATERAL TISSUE EXPANDERS WITH PLACEMENT OF BILATERAL BREAST IMPLANTS Bilateral 05/09/2021   Procedure: REMOVAL OF BILATERAL TISSUE EXPANDERS WITH PLACEMENT OF BILATERAL BREAST IMPLANTS;  Surgeon: Wallace Going, DO;  Location: [place];  Service: Plastics;  Laterality: Bilateral;  90 min   TOTAL MASTECTOMY Right 03/17/2021   Procedure: TOTAL MASTECTOMY;  Surgeon: Erroll Luna, MD;  Location: [place];  Service: General;  Laterality: Right;    There were no vitals filed for this visit.   Subjective Assessment - 09/29/21 0908     Subjective A couple of weeks ago after my last appointment my cording came back and it has gradually gotten to where I can feel lit more.    Pertinent History L breast cancer ER+/PR+ HER2-, stage III, underwent a bilateral mastectomy and ALND on L (7/16) on 03/17/21, reconstruction in summer 2022, neuropathy of hands, lower legs and feet, completed radiation, completed radiation Dec17, 2021 to February 18, 2021    Patient Stated Goals to gain info from providers    Currently in Pain? No/denies    Pain Score 0-No pain  [facility] Adult PT Treatment/Exercise - 09/29/21 0001       Manual Therapy   Soft tissue mobilization to L pec using cocoa butter with UE in various degrees of flexion and abduction    Myofascial Release to cording in L axilla with one very thick cord palpable from implant extending in to axilla and down upper arm and another cord in upper arm    Passive ROM to L shoulder in direction of flexion and abduction                       PT Short Term Goals - 09/27/21 0957       PT SHORT TERM GOAL #1   Title Be independent with initial home exercise program for  self-management of symptoms.    Baseline initial HEP to be provided at visit 2 as appropriate (08/25/2021); initial HEP provided at visit 2 (08/30/2021);    Time 2    Period Weeks    Status Achieved    Target Date 09/08/21               PT Long Term Goals - 09/29/21 0001       PT LONG TERM GOAL #1   Title Pt will return to baseline shoulder ROM measurements and not demonstrate any signs or symptoms of lymphedema.    Baseline 05/31/21- pt has 180 degrees of flex and abd bilaterally    Time 4    Period Weeks    Status Achieved      PT LONG TERM GOAL #2   Title Pt will demonstrate 165 degrees of bilateral flexion to allow pt to reach overhead.    Baseline R 143 L 101; 05/02/21- 172 on R, 175 on L    Time 4    Period Weeks    Status Achieved      PT LONG TERM GOAL #3   Title Pt will demonstrate 165 degrees of bilateral shoulder abduction to allow her to reach out the side    Baseline R 152 L 78; 05/02/21- R 180 L 179    Time 4    Period Weeks    Status Achieved      PT LONG TERM GOAL #4   Title Pt will report she is no longer having muscle spasms across her chest to allow improved comfort    Baseline 05/02/21- pt reports she is no longer having muscle spasms    Time 4    Period Weeks    Status Achieved      PT LONG TERM GOAL #5   Title Pt will be independent in a home exercise program for long term strengthening and stretching.    Time 4    Period Weeks    Status On-going      PT LONG TERM GOAL #6   Title Pt will report a 50% improvement in edema in L trunk and chest to allow improved comfort.    Baseline 05/02/21- 20% improved; 05/31/21 no edema present    Time 4    Period Weeks    Status Achieved      PT LONG TERM GOAL #7   Title Pt will be independent in Strength ABC program for long term stretching and strengthening after completion of radiation.    Time 4    Status On-going      PT LONG TERM GOAL #8   Title Pt will demonstrate baseline ROM (180 degrees) of  flexion and abduction of  LUE to allow pt to return to prior level of function.    Baseline 160 flex, 164 abd; 09/29/21- flex- 160, abd - 156 both with pain from cording in axilla    Time 4    Period Weeks                   Plan - 09/29/21 0957     Clinical Impression Statement Pt returns to therapy. She reports that her cording has worsened over the last few weeks and she has trouble lifting her arm out to the side. She has tightness of the left shoulder secondary to cording and to a very tight left pec muscle. There are several cords palpable and one is very thick and located in the axilla extending from implant down upper arm. Pt would benefit from continued skilled PT services to improve L shoulder ROM, decrease pec tightness, and decrease cording as well as instruct pt in a home exercise program for continued stretching and strengthening.    PT Frequency 2x / week    PT Duration 4 weeks    PT Treatment/Interventions ADLs/Self Care Home Management;Therapeutic exercise;Patient/family education;Scar mobilization;Passive range of motion;Manual techniques;Manual lymph drainage;Compression bandaging;Taping;Vasopneumatic Device    PT Next Visit Plan MFR to L axilla in area of cording, STM to L pec, pec stretching, eventually instruct in Strength ABC program; Cont every 3 month L-Dex screens for up to 2 years from her SLNB    PT Home Exercise Plan post op breast exercises, instructed pt in supine dowel, supine scap series    Consulted and Agree with Plan of Care Patient             Patient will benefit from skilled therapeutic intervention in order to improve the following deficits and impairments:  Pain, Postural dysfunction, Decreased knowledge of precautions, Impaired UE functional use, Increased fascial restricitons, Decreased strength, Decreased range of motion, Decreased scar mobility, Increased edema, Increased muscle spasms  Visit Diagnosis: Stiffness of left shoulder, not  elsewhere classified  Disorder of the skin and subcutaneous tissue related to radiation, unspecified  Aftercare following surgery for neoplasm  Abnormal posture  Malignant 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, estrogen receptor positive, unspecified site of breast [facility])     Problem List Patient Active Problem List   Diagnosis Date Noted   Acquired absence of breast 08/16/2021   Dysuria 08/05/2021   B12 deficiency 07/21/2021   Atherosclerosis of aorta ([place]) 07/20/2021   Hepatic steatosis 07/20/2021   Abdominal pain 07/18/2021   Breast cancer ([place]) 03/17/2021   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[place]) 03/14/2021   Genetic testing 11/22/2020   Family history of ovarian cancer    Candidal vulvovaginitis 11/01/2020   Low back pain 10/06/2020   Encounter for medical examination to establish care 09/26/2020   Malignant neoplasm of upper-outer quadrant of left breast in female, estrogen receptor positive ([place]) 09/26/2020   BACK STRAIN, LUMBAR 03/08/2010   INTERNAL HEMORRHOIDS 01/27/2010   ANAL FISSURE 01/27/2010   OSTEOARTHRITIS, HANDS, BILATERAL 01/04/2010   ARTHRALGIA 10/05/2009   INSOMNIA, CHRONIC 05/06/2009   ALLERGIC RHINITIS, SEASONAL 02/09/2009   TOBACCO ABUSE 12/29/2008   IBS 10/13/2008   Anxiety state 08/25/2008   DEPRESSION, RECURRENT 08/25/2008    Manus Gunning, PT 09/29/2021, 10:01 AM  [facility] @ [place], [place], [address] Phone: [phone]   Fax:  [phone]  Name: Tricia Berry MRN: [id number] Date of Birth: 02/01/1971   Manus Gunning, PT  09/29/21 10:01 AM

## 2021-09-30 ENCOUNTER — Other Ambulatory Visit: Payer: Self-pay | Admitting: Student

## 2021-09-30 ENCOUNTER — Other Ambulatory Visit: Payer: Self-pay

## 2021-09-30 ENCOUNTER — Other Ambulatory Visit: Payer: Self-pay | Admitting: Pharmacist

## 2021-09-30 DIAGNOSIS — M419 Scoliosis, unspecified: Secondary | ICD-10-CM

## 2021-09-30 DIAGNOSIS — M5442 Lumbago with sciatica, left side: Secondary | ICD-10-CM

## 2021-09-30 DIAGNOSIS — G8929 Other chronic pain: Secondary | ICD-10-CM

## 2021-09-30 LAB — URINE CULTURE
MICRO NUMBER:: 12726335
SPECIMEN QUALITY:: ADEQUATE

## 2021-10-03 ENCOUNTER — Encounter: Payer: Self-pay | Admitting: Physical Therapy

## 2021-10-03 ENCOUNTER — Other Ambulatory Visit: Payer: Self-pay

## 2021-10-03 ENCOUNTER — Ambulatory Visit: Payer: No Typology Code available for payment source | Admitting: Physical Therapy

## 2021-10-03 ENCOUNTER — Ambulatory Visit: Payer: No Typology Code available for payment source

## 2021-10-03 DIAGNOSIS — R293 Abnormal posture: Secondary | ICD-10-CM

## 2021-10-03 DIAGNOSIS — M25612 Stiffness of left shoulder, not elsewhere classified: Secondary | ICD-10-CM | POA: Diagnosis not present

## 2021-10-03 DIAGNOSIS — C50912 Malignant neoplasm of unspecified site of left female breast: Secondary | ICD-10-CM

## 2021-10-03 DIAGNOSIS — Z483 Aftercare following surgery for neoplasm: Secondary | ICD-10-CM

## 2021-10-03 DIAGNOSIS — L599 Disorder of the skin and subcutaneous tissue related to radiation, unspecified: Secondary | ICD-10-CM

## 2021-10-03 NOTE — Therapy (Signed)
[facility] @ [place], [place], [address] Phone: [phone]   Fax:  [phone]  Physical Therapy Treatment  Patient Details  Name: Tricia Berry MRN: [id number] Date of Birth: 08/04/1971 Referring Provider (PT): Burnard Hawthorne, FNP   Encounter Date: 10/03/2021   PT End of Session - 10/03/21 1454     Visit Number 13    Number of Visits 20    Date for PT Re-Evaluation 10/27/21    PT Start Time 1409    PT Stop Time 1457    PT Time Calculation (min) 48 min    Activity Tolerance Patient tolerated treatment well    Behavior During Therapy [facility] for tasks assessed/performed             Past Medical History:  Diagnosis Date   Anxiety    Breast cancer ([place])    Diverticulitis    Family history of ovarian cancer    GERD (gastroesophageal reflux disease)    IBS (irritable bowel syndrome)    PONV (postoperative nausea and vomiting)    severe migraine and vomiting post anesthesia   Scoliosis     Past Surgical History:  Procedure Laterality Date   ABDOMINAL HYSTERECTOMY     still has ovaries, no gyn cancer, hysterectomy due to endometriosis. NO cervix on exam 01/10/21   BREAST BIOPSY Left 09/15/2020   [place] bx of mass, path pending, Q marker   BREAST BIOPSY Left 09/15/2020   [place] bx of LN, hydromarker, path pending   BREAST RECONSTRUCTION WITH PLACEMENT OF TISSUE EXPANDER AND FLEX HD (ACELLULAR HYDRATED DERMIS) Bilateral 03/17/2021   Procedure: IMMEDIATE BILATERAL BREAST RECONSTRUCTION WITH PLACEMENT OF TISSUE EXPANDER AND FLEX HD (ACELLULAR HYDRATED DERMIS);  Surgeon: Wallace Going, DO;  Location: [place];  Service: Plastics;  Laterality: Bilateral;   IR IMAGING GUIDED PORT INSERTION  10/01/2020   MODIFIED MASTECTOMY Left 03/17/2021   Procedure: LEFT MODIFIED RADICAL MASTECTOMY;  Surgeon: Erroll Luna, MD;  Location: [place];  Service: General;  Laterality:  Left;   PORTA CATH REMOVAL Right 03/17/2021   Procedure: PORTA CATH REMOVAL;  Surgeon: Erroll Luna, MD;  Location: [place];  Service: General;  Laterality: Right;   REMOVAL OF BILATERAL TISSUE EXPANDERS WITH PLACEMENT OF BILATERAL BREAST IMPLANTS Bilateral 05/09/2021   Procedure: REMOVAL OF BILATERAL TISSUE EXPANDERS WITH PLACEMENT OF BILATERAL BREAST IMPLANTS;  Surgeon: Wallace Going, DO;  Location: [place];  Service: Plastics;  Laterality: Bilateral;  90 min   TOTAL MASTECTOMY Right 03/17/2021   Procedure: TOTAL MASTECTOMY;  Surgeon: Erroll Luna, MD;  Location: [place];  Service: General;  Laterality: Right;    There were no vitals filed for this visit.   Subjective Assessment - 10/03/21 1413     Subjective My cording is still there. I have been walking up the wall.    Pertinent History L breast cancer ER+/PR+ HER2-, stage III, underwent a bilateral mastectomy and ALND on L (7/16) on 03/17/21, reconstruction in summer 2022, neuropathy of hands, lower legs and feet, completed radiation, completed radiation Dec17, 2021 to February 18, 2021    Patient Stated Goals to gain info from providers    Currently in Pain? No/denies    Pain Score 0-No pain                    L-DEX FLOWSHEETS - 10/03/21 1400       L-DEX  LYMPHEDEMA SCREENING   Measurement Type Unilateral    L-DEX MEASUREMENT EXTREMITY Upper Extremity    POSITION  Standing    DOMINANT SIDE Right    At Risk Side Left    BASELINE SCORE (UNILATERAL) 0.6    L-DEX SCORE (UNILATERAL) 1.4    VALUE CHANGE (UNILAT) 0.8                       [facility] Adult PT Treatment/Exercise - 10/03/21 0001       Manual Therapy   Soft tissue mobilization to L pec with UE in various degrees of flexion and abduction    Myofascial Release to cording in L axilla with one very thick cord palpable from implant extending in to axilla and down upper arm and another cord in  upper arm    Passive ROM to L shoulder in direction of flexion and abduction                         PT Long Term Goals - 09/29/21 0001       PT LONG TERM GOAL #1   Title Pt will return to baseline shoulder ROM measurements and not demonstrate any signs or symptoms of lymphedema.    Baseline 05/31/21- pt has 180 degrees of flex and abd bilaterally    Time 4    Period Weeks    Status Achieved      PT LONG TERM GOAL #2   Title Pt will demonstrate 165 degrees of bilateral flexion to allow pt to reach overhead.    Baseline R 143 L 101; 05/02/21- 172 on R, 175 on L    Time 4    Period Weeks    Status Achieved      PT LONG TERM GOAL #3   Title Pt will demonstrate 165 degrees of bilateral shoulder abduction to allow her to reach out the side    Baseline R 152 L 78; 05/02/21- R 180 L 179    Time 4    Period Weeks    Status Achieved      PT LONG TERM GOAL #4   Title Pt will report she is no longer having muscle spasms across her chest to allow improved comfort    Baseline 05/02/21- pt reports she is no longer having muscle spasms    Time 4    Period Weeks    Status Achieved      PT LONG TERM GOAL #5   Title Pt will be independent in a home exercise program for long term strengthening and stretching.    Time 4    Period Weeks    Status On-going      PT LONG TERM GOAL #6   Title Pt will report a 50% improvement in edema in L trunk and chest to allow improved comfort.    Baseline 05/02/21- 20% improved; 05/31/21 no edema present    Time 4    Period Weeks    Status Achieved      PT LONG TERM GOAL #7   Title Pt will be independent in Strength ABC program for long term stretching and strengthening after completion of radiation.    Time 4    Status On-going      PT LONG TERM GOAL #8   Title Pt will demonstrate baseline ROM (180 degrees) of flexion and abduction of LUE to allow pt to return to prior level of function.  Baseline 160 flex, 164 abd; 09/29/21- flex- 160,  abd - 156 both with pain from cording in axilla    Time 4    Period Weeks                   Plan - 10/03/21 1509     Clinical Impression Statement Continued to focus on decreasing thick cording present in pt's L axilla extending down to her implant. Pt is still having increased tightness and discomfort as a result of the cording. Her pec was not as tight as it was last session and pt was able to tolerate more stretching today. She reports she has been doing her stretches at home since last session. Will continue to work on decreasing cording in L axilla as pt is eager to get back to normal and return to work. Repeated her SOZO measurement today and it was in good range and had only minimally increased since last session.    PT Frequency 2x / week    PT Duration 4 weeks    PT Treatment/Interventions ADLs/Self Care Home Management;Therapeutic exercise;Patient/family education;Scar mobilization;Passive range of motion;Manual techniques;Manual lymph drainage;Compression bandaging;Taping;Vasopneumatic Device    PT Next Visit Plan have pt schedule 3 month SOZO in mid March, MFR to L axilla in area of cording, STM to L pec, pec stretching, eventually instruct in Strength ABC program; Cont every 3 month L-Dex screens for up to 2 years from her SLNB    PT Home Exercise Plan post op breast exercises, instructed pt in supine dowel, supine scap series    Consulted and Agree with Plan of Care Patient             Patient will benefit from skilled therapeutic intervention in order to improve the following deficits and impairments:  Pain, Postural dysfunction, Decreased knowledge of precautions, Impaired UE functional use, Increased fascial restricitons, Decreased strength, Decreased range of motion, Decreased scar mobility, Increased edema, Increased muscle spasms  Visit Diagnosis: Stiffness of left shoulder, not elsewhere classified  Disorder of the skin and subcutaneous tissue related to  radiation, unspecified  Aftercare following surgery for neoplasm  Abnormal posture  Malignant 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, estrogen receptor positive, unspecified site of breast [facility])     Problem List Patient Active Problem List   Diagnosis Date Noted   Acquired absence of breast 08/16/2021   Dysuria 08/05/2021   B12 deficiency 07/21/2021   Atherosclerosis of aorta ([place]) 07/20/2021   Hepatic steatosis 07/20/2021   Abdominal pain 07/18/2021   Breast cancer ([place]) 03/17/2021   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   Genetic testing 11/22/2020   Family history of ovarian cancer    Candidal vulvovaginitis 11/01/2020   Low back pain 10/06/2020   Encounter for medical examination to establish care 09/26/2020   Malignant neoplasm of upper-outer quadrant of left breast in female, estrogen receptor positive ([place]) 09/26/2020   BACK STRAIN, LUMBAR 03/08/2010   INTERNAL HEMORRHOIDS 01/27/2010   ANAL FISSURE 01/27/2010   OSTEOARTHRITIS, HANDS, BILATERAL 01/04/2010   ARTHRALGIA 10/05/2009   INSOMNIA, CHRONIC 05/06/2009   ALLERGIC RHINITIS, SEASONAL 02/09/2009   TOBACCO ABUSE 12/29/2008   IBS 10/13/2008   Anxiety state 08/25/2008   DEPRESSION, RECURRENT 08/25/2008    Manus Gunning, PT 10/03/2021, 3:12 PM  [facility] @ [place], [place], [address] Phone: [phone]   Fax:  [phone]  Name: Tricia Berry MRN: [id number] Date of Birth: 10/10/1971   Allyson Sabal  Blue, PT 10/03/21 3:12 PM

## 2021-10-04 ENCOUNTER — Encounter: Payer: Self-pay | Admitting: Oncology

## 2021-10-04 ENCOUNTER — Encounter: Payer: Self-pay | Admitting: Physical Therapy

## 2021-10-04 ENCOUNTER — Ambulatory Visit: Payer: No Typology Code available for payment source | Admitting: Physical Therapy

## 2021-10-04 DIAGNOSIS — R262 Difficulty in walking, not elsewhere classified: Secondary | ICD-10-CM

## 2021-10-04 DIAGNOSIS — M6281 Muscle weakness (generalized): Secondary | ICD-10-CM | POA: Insufficient documentation

## 2021-10-04 DIAGNOSIS — M545 Low back pain, unspecified: Secondary | ICD-10-CM | POA: Diagnosis present

## 2021-10-04 DIAGNOSIS — G8929 Other chronic pain: Secondary | ICD-10-CM | POA: Insufficient documentation

## 2021-10-04 DIAGNOSIS — M533 Sacrococcygeal disorders, not elsewhere classified: Secondary | ICD-10-CM | POA: Insufficient documentation

## 2021-10-04 NOTE — Therapy (Signed)
[facility] [address], [place], [address] Phone: [phone]   Fax:  [phone]  Physical Therapy Treatment  Patient Details  Name: Tricia Berry MRN: [id number] Date of Birth: March 27, 1971 Referring Provider (PT): Burnard Hawthorne, FNP   Encounter Date: 10/04/2021   PT End of Session - 10/04/21 1047     Visit Number 6    Number of Visits 24    Date for PT Re-Evaluation 11/17/21    Authorization Type [place] [employer] reporting period from 08/25/2021    Progress Note Due on Visit 10    PT Start Time 0950    PT Stop Time 1045    PT Time Calculation (min) 55 min    Activity Tolerance Patient tolerated treatment well    Behavior During Therapy [facility] for tasks assessed/performed             Past Medical History:  Diagnosis Date   Anxiety    Breast cancer ([place])    Diverticulitis    Family history of ovarian cancer    GERD (gastroesophageal reflux disease)    IBS (irritable bowel syndrome)    PONV (postoperative nausea and vomiting)    severe migraine and vomiting post anesthesia   Scoliosis     Past Surgical History:  Procedure Laterality Date   ABDOMINAL HYSTERECTOMY     still has ovaries, no gyn cancer, hysterectomy due to endometriosis. NO cervix on exam 01/10/21   BREAST BIOPSY Left 09/15/2020   [place] bx of mass, path pending, Q marker   BREAST BIOPSY Left 09/15/2020   [place] bx of LN, hydromarker, path pending   BREAST RECONSTRUCTION WITH PLACEMENT OF TISSUE EXPANDER AND FLEX HD (ACELLULAR HYDRATED DERMIS) Bilateral 03/17/2021   Procedure: IMMEDIATE BILATERAL BREAST RECONSTRUCTION WITH PLACEMENT OF TISSUE EXPANDER AND FLEX HD (ACELLULAR HYDRATED DERMIS);  Surgeon: Wallace Going, DO;  Location: [place];  Service: Plastics;  Laterality: Bilateral;   IR IMAGING GUIDED PORT INSERTION  10/01/2020   MODIFIED MASTECTOMY Left 03/17/2021   Procedure: LEFT MODIFIED RADICAL  MASTECTOMY;  Surgeon: Erroll Luna, MD;  Location: [place];  Service: General;  Laterality: Left;   PORTA CATH REMOVAL Right 03/17/2021   Procedure: PORTA CATH REMOVAL;  Surgeon: Erroll Luna, MD;  Location: [place];  Service: General;  Laterality: Right;   REMOVAL OF BILATERAL TISSUE EXPANDERS WITH PLACEMENT OF BILATERAL BREAST IMPLANTS Bilateral 05/09/2021   Procedure: REMOVAL OF BILATERAL TISSUE EXPANDERS WITH PLACEMENT OF BILATERAL BREAST IMPLANTS;  Surgeon: Wallace Going, DO;  Location: [place];  Service: Plastics;  Laterality: Bilateral;  90 min   TOTAL MASTECTOMY Right 03/17/2021   Procedure: TOTAL MASTECTOMY;  Surgeon: Erroll Luna, MD;  Location: [place];  Service: General;  Laterality: Right;    There were no vitals filed for this visit.   Subjective Assessment - 10/04/21 1014     Subjective Patient reports she saw the orthopedist Cameron Proud, PA at [facility]) who reccomended she come to one more PT session here to get a more indepth HEP, then wait until she gets an MRI ordered by him and see a center she had not heard of at [facility] where she can get injections, medications, and PT managed all in one place. Reveiw of documentation shows MRI order and referral to physiatry. [name] also received a R SIJ injection from Dr. Candelaria Stagers at the same appointment. She states it  felt good at first due to the numbing. She states low back pain prevents her from vaccuming. She was found to have a leg length difference of 1.1cm with radiography with the right leg longer and reccomended to get a heel lift to put in her left shoe. By end of session, patient felt confused about why she would be referred to another PT when she is already established here and plans to call [facility] to clarify instrutions on what she is supposed to do with PT at this point. She states she has been doing her standing HEP  daily most of the time and she moved the red theraband more proximal on her thighs to make hip abduction exercise harder.    Pertinent History Patient is a 50 y.o. female who presents to outpatient physical therapy with a referral for medical diagnosis Right-sided low back pain without sciatica, unspecified chronicity. This patient's chief complaints consist of worsening chronic R sided low back pain that intermittently radiates down the leg and across the back to the left side leading to the following functional deficits: sudden grabbing stops her in her tracks and she must sit down, stretching, bending, work responsibilities, dressing, petting cats, caring for cats, lifting, moving patients, yardwork, gardening, functional mobility, etc.  Relevant past medical history and comorbidities include L breast cancer stage IIIb (dx Nov 2021. ER+/PR+ HER2-, underwent a bilateral mastectomy and ALND on L (7/16) on 03/17/21, reconstruction in summer 2022, neuropathy of hands, lower legs and feet, completed chemo, completed radiation Dec17, 2021 to February 18, 2021, currently taking oral medication and infusions), scoliosis, internal hemorrhoids, atherosclerosis of aorta, hepatic steatosis, chemotherapy -induced neuropathy, anxiety/depression, chronic insomnia, arthralgia, B12 deficiency, GERD, IBS, diverticulitis, current smoker (trying to quit), UTIs, abdominal hysterectomy.Patient denies hx of  stroke, seizures, lung problem, major cardiac events, diabetes, changes in bowel or bladder problems, new onset stumbling or dropping things (that appears related to cancer treatments), spinal surgeries, osteoporosis (but is taking medication that can weaken bones).    Limitations House hold activities;Lifting;Standing;Walking;Other (comment)   sudden grabbing stops her in her tracks and she must sit down, stretching, bending, work responsibilities, dressing, petting cats, caring for cats, lifting, moving patients, yardwork,  gardening, functional mobility, etc.   Diagnostic tests R hip xray report 08/05/2021: negative. Lumbar xray report 10/06/2021: "IMPRESSION:  Severe lumbar spine scoliosis concave right. Diffuse multilevel  degenerative change. No acute or focal bony abnormality identified." CT abdomen/pelvis report 07/18/2021: "Musculoskeletal: No acute or destructive bony lesions. Left convex  thoracolumbar scoliosis. Reconstructed images demonstrate no  additional findings. IMPRESSION:  1. Diverticulosis of the rectosigmoid colon, with no evidence of  acute diverticulitis. Mild wall thickening of the sigmoid colon  likely reflects scarring from previous bouts of diverticulitis.  2. Normal appendix.  3. Mild hepatic steatosis.  4.  Aortic Atherosclerosis (ICD10-I70.0)."             OBJECTIVE   SELF-REPORTED FUNCTION FOTO score: 56/100 (lumbar spine questionnaire)     TREATMENT:   denies latex sensitivity   Therapeutic exercise: to centralize symptoms and improve ROM, strength, muscular endurance, and activity tolerance required for successful completion of functional activities.  - NuStep level 4 using bilateral lower extremities. Seat setting 4. For improved extremity mobility, muscular endurance, and activity tolerance; and to induce the analgesic effect of aerobic exercise, stimulate improved joint nutrition, and prepare body structures and systems for following interventions. x 7:43 minutes. Average SPM = 67 - standing diagonal hip abduction with B UE  support, 1x10 each side with RTB around distal thighs, 1x10 each side with YTB around ankles. Also completed reps in patient preference with red theraband around distal thighs with no UE support in more frontal plane to assess patient's current form at home.  - sit <> stand form 18 in chair while holding a 2#DB in each hand, 1x10 then progressed to buttocks tap on chair while moving arms to 90 degrees flexion, 1x10. Improved low back comfort with cuing not to  overly arch back.  - runner's step up to 12 inch step (2nd step on stairs), 1x10 each side with touch down UE support.  - hooklying bridge with 5 second hold, 1x5 - hooklying bridge with hip abduction against RTB around distal thighs, 1x20 with 5 second hold. - hooklying single leg bridge, ~ 1x5 each side (difficulty due to feeling "catch" in lower back, improved with rest and cuing not to arch low back.  - hooklying abdominal brace with marching, 1x5 each side (easy) - hooklying abdominal brace with alternating LE extension, 1x10 each side - hooklying reverse curl hold, 1x5 with 5 second hold - Education on HEP including handout    Pt required multimodal cuing for proper technique and to facilitate improved neuromuscular control, strength, range of motion, and functional ability resulting in improved performance and form.   HOME EXERCISE PROGRAM Access Code: [email or web address] URL: [email or web address] Date: 10/04/2021 Prepared by: Rosita Kea  Exercises Bridge with Hip Abduction and Resistance - Ground Touches - 3 x weekly - 1 sets - 20 reps - 5 seconds hold Supine Transversus Abdominis Bracing with Leg Extension - 3 x weekly - 3 sets - 10 reps Single Leg Bridge - 3 x weekly - 3 sets - 10 reps - 1-5 seconds hold Reverse Curl - 3 x weekly - 1 sets - 10-20 reps - 5-10 seconds hold Squat with Chair Touch - 3 x weekly - 3 sets - 10 reps Diagonal Hip Extension with Resistance - 3 x weekly - 3 sets - 10 reps - 1 second hold Runner's Step Up/Down - 3 x weekly - 3 sets - 10 reps    PT Education - 10/04/21 1109     Education Details Exercise purpose/form. Self management techniques. Possible discharge reccomendations.    Person(s) Educated Patient    Methods Explanation;Demonstration;Tactile cues;Verbal cues;Handout    Comprehension Verbalized understanding;Returned demonstration;Verbal cues required;Tactile cues required              PT Short Term Goals - 09/27/21 0957        PT SHORT TERM GOAL #1   Title Be independent with initial home exercise program for self-management of symptoms.    Baseline initial HEP to be provided at visit 2 as appropriate (08/25/2021); initial HEP provided at visit 2 (08/30/2021);    Time 2    Period Weeks    Status Achieved    Target Date 09/08/21               PT Long Term Goals - 10/04/21 1110       PT LONG TERM GOAL #1   Title Be independent with a long-term home exercise program for self-management of symptoms.    Baseline initial HEP to be provided at visit 2 as appropriate (08/25/2021); initial HEP provided on visit 2 (08/30/2021); HEP updated to be more comprehensive for long term use with tentative discharge today (10/04/2021);    Time 12    Period Weeks    Status Partially Met  TARGET DATE FOR ALL LONG TERM GOALS: 11/17/2021     PT LONG TERM GOAL #2   Title Demonstrate improved FOTO to equal or greater than 71 by visit #10 to demonstrate improvement in overall condition and self-reported functional ability.    Baseline 54 (08/25/2021); 56 at visit #6 (10/04/2021);    Time 12    Period Weeks    Status Partially Met      PT LONG TERM GOAL #3   Title Have full lumbar AROM with no compensations outside of those expected from scoliosis or increase in pain in all planes except intermittent end range discomfort to improve ability  to complete valued activities such bending, carrying, rolling, dressing, etc.    Baseline painful and limited - see objective (08/25/2021);    Time 12    Period Weeks    Status On-going      PT LONG TERM GOAL #4   Title Improve B hip strength to equal or greater than 4+/5 with no increase in pain to improve patients ability to complete functional tasks such as working, dressing, walking, yardwork with less difficulty.    Baseline painful and weak - see objective (08/25/2021);    Time 12    Period Weeks    Status On-going      PT LONG TERM GOAL #5   Title Reduce pain with functional  activities to equal or less than 1/10 to allow patient to complete usual activities including housework, walking, bending, sleeping with less difficulty.    Baseline up to 10/10 (08/25/2021);    Time 12    Period Weeks    Status On-going      PT LONG TERM GOAL #6   Title Complete community, work and/or recreational activities without limitation due to current condition.    Baseline sudden grabbing stops her in her tracks and she must sit down, stretching, bending, work responsibilities, dressing, petting cats, caring for cats, lifting, moving patients, yardwork, gardening, functional mobility, etc (08/25/2021);    Time 12    Period Weeks    Status On-going                   Plan - 10/04/21 1722     Clinical Impression Statement Patient arrives today reporting she thinks her orthopedic doctor recommended she discharge from PT at this time with comprehensive HEP and then follow up with physiatry who will then coordinate all PT, injections, and medications at that office at [facility]. Patient does express she feels frustrated at starting PT with a new therapist after starting here and would like to clarify with her doctor their instructions. Today's session focused on updating pt's HEP to be appropriate for her to continue practicing at home while away from PT. Patient was challenged by the mat exercises more than the standing exercises and received 2 most challenging exercises with instructions on how to progress. Patient did need cuing to improve form to maximize effectiveness and improve tolerance and would benefit from continued PT. Patient would benefit from continued management of limiting condition by skilled physical therapist to address remaining impairments and functional limitations to work towards stated goals and return to PLOF or maximal functional independence. However, plan to discharge PT at this time if she calls back with information from MD clarifying this  recommendation and patient's preference.    Personal Factors and Comorbidities Comorbidity 3+;Past/Current Experience;Profession;Fitness;Social Background;Time since onset of injury/illness/exacerbation    Comorbidities Relevant past medical history and comorbidities include L breast  cancer stage IIIb (dx Nov 2021. ER+/PR+ HER2-, underwent a bilateral mastectomy and ALND on L (7/16) on 03/17/21, reconstruction in summer 2022, neuropathy of hands, lower legs and feet, completed chemo, completed radiation Dec17, 2021 to February 18, 2021, currently taking oral medication and infusions), scoliosis, internal hemorrhoids, atherosclerosis of aorta, hepatic steatosis, chemotherapy -induced neuropathy, anxiety/depression, chronic insomnia, arthralgia, B12 deficiency, GERD, IBS, diverticulitis, current smoker (trying to quit), UTIs, abdominal hysterectomy    Examination-Activity Limitations Bed Mobility;Lift;Squat;Bend;Locomotion Level;Stand;Caring for Others;Carry;Transfers;Sleep;Dressing    Examination-Participation Restrictions Laundry;Shop;Cleaning;Community Activity;Meal Prep;Occupation;Yard Work;Interpersonal Relationship;Other   sudden grabbing stops her in her tracks and she must sit down, stretching, bending, work responsibilities, dressing, petting cats, caring for cats, lifting, moving patients, yardwork, gardening, functional mobility, etc.   Stability/Clinical Decision Making Evolving/Moderate complexity    Rehab Potential Good    PT Frequency 2x / week    PT Duration 12 weeks    PT Treatment/Interventions ADLs/Self Care Home Management;Aquatic Therapy;Cryotherapy;Gait training;Stair training;Functional mobility training;Therapeutic activities;Therapeutic exercise;Balance training;Neuromuscular re-education;Manual techniques;Dry needling;Energy conservation;Patient/family education    PT Next Visit Plan core/functional/LE strengthening as tolerated in continuing PT but plan to discharge PT at this time if  she calls back with information from MD clarifying this recommendation and patient's preference.    PT Home Exercise Plan Medbridge Access Code: [email or web address]    Consulted and Agree with Plan of Care Patient             Patient will benefit from skilled therapeutic intervention in order to improve the following deficits and impairments:  Abnormal gait, Impaired sensation, Improper body mechanics, Pain, Postural dysfunction, Hypermobility, Decreased activity tolerance, Decreased endurance, Decreased range of motion, Decreased strength, Impaired perceived functional ability, Difficulty walking, Decreased balance  Visit Diagnosis: Chronic right-sided low back pain, unspecified whether sciatica present  Sacrococcygeal disorders, not elsewhere classified  Muscle weakness (generalized)  Difficulty in walking, not elsewhere classified     Problem List Patient Active Problem List   Diagnosis Date Noted   Acquired absence of breast 08/16/2021   Dysuria 08/05/2021   B12 deficiency 07/21/2021   Atherosclerosis of aorta ([place]) 07/20/2021   Hepatic steatosis 07/20/2021   Abdominal pain 07/18/2021   Breast cancer ([place]) 03/17/2021   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[place]) 03/14/2021   Genetic testing 11/22/2020   Family history of ovarian cancer    Candidal vulvovaginitis 11/01/2020   Low back pain 10/06/2020   Encounter for medical examination to establish care 09/26/2020   Malignant neoplasm of upper-outer quadrant of left breast in female, estrogen receptor positive ([place]) 09/26/2020   BACK STRAIN, LUMBAR 03/08/2010   INTERNAL HEMORRHOIDS 01/27/2010   ANAL FISSURE 01/27/2010   OSTEOARTHRITIS, HANDS, BILATERAL 01/04/2010   ARTHRALGIA 10/05/2009   INSOMNIA, CHRONIC 05/06/2009   ALLERGIC RHINITIS, SEASONAL 02/09/2009   TOBACCO ABUSE 12/29/2008   IBS 10/13/2008   Anxiety state 08/25/2008   DEPRESSION, RECURRENT 08/25/2008    Everlean Alstrom. Graylon Good, PT, DPT 10/04/21, 5:23 PM   [facility] [address] [place], [address] Phone: [phone]   Fax:  [phone]  Name: ILARIA MUCH MRN: [id number] Date of Birth: Mar 18, 1971

## 2021-10-06 ENCOUNTER — Encounter: Payer: No Typology Code available for payment source | Admitting: Physical Therapy

## 2021-10-06 NOTE — Progress Notes (Signed)
10/07/21 3:03 PM   Tricia Berry January 01, 1971 [id number]  Referring provider:  Burnard Hawthorne, FNP [address] [place],  [place] [address] Chief Complaint  Patient presents with   New Patient (Initial Visit)   Dysuria      HPI: Tricia Berry is a 50 y.o.female who presents today for further evaluation of dysuria.   She recently had a ct of abdomen and pelvis in 06/2021 of this year that showed kidneys enhance normally and symmetrically. No urinary tract calculi or obstructive uropathy within either kidney. The adrenals and bladder are unremarkable  She had a positive urinalysis on 09/13/2021 at which time she grew E. coli.  She was treated by her PCP with Macrobid.  She was only treated for 5-day course.  She was dx with with UTI 09/28/21 with UCx growing E. Coli resistant to ampicillin.  She was prescribed macrobid.    Review of her medical record indicates that she has had a UTI almost every month for the past year, alternate between E. coli and Enterococcus.  She does have some resistance to Cipro and ampicillin on some of the cultures.  Her preference is to avoid Cipro because of the black box warning.  She has a personal history of breast cancer and currently on Arimidex.  She does mention today that she has a personal history of UTIs.  This was prior to her breast cancer but the swelling been exacerbated since treatment was initiated.  She was using [place] supplements (d-mannose is the active ingredient ) was told to stop them when she was doing her more traditional chemotherapy.  She also used strong cranberry supplements when she felt like she was getting an infection but has not been using these recently either.  She does use vaginal probiotics as well as oral probiotics for her IBS.  He has experienced significant vaginal dryness and uses Replens as needed for this.  PMH: Past Medical History:  Diagnosis Date   Anxiety    Breast cancer ([facility])     Diverticulitis    Family history of ovarian cancer    GERD (gastroesophageal reflux disease)    IBS (irritable bowel syndrome)    PONV (postoperative nausea and vomiting)    severe migraine and vomiting post anesthesia   Scoliosis     Surgical History: Past Surgical History:  Procedure Laterality Date   ABDOMINAL HYSTERECTOMY     still has ovaries, no gyn cancer, hysterectomy due to endometriosis. NO cervix on exam 01/10/21   BREAST BIOPSY Left 09/15/2020   [place] bx of mass, path pending, Q marker   BREAST BIOPSY Left 09/15/2020   [place] bx of LN, hydromarker, path pending   BREAST RECONSTRUCTION WITH PLACEMENT OF TISSUE EXPANDER AND FLEX HD (ACELLULAR HYDRATED DERMIS) Bilateral 03/17/2021   Procedure: IMMEDIATE BILATERAL BREAST RECONSTRUCTION WITH PLACEMENT OF TISSUE EXPANDER AND FLEX HD (ACELLULAR HYDRATED DERMIS);  Surgeon: Wallace Going, DO;  Location: [facility];  Service: Plastics;  Laterality: Bilateral;   IR IMAGING GUIDED PORT INSERTION  10/01/2020   MODIFIED MASTECTOMY Left 03/17/2021   Procedure: LEFT MODIFIED RADICAL MASTECTOMY;  Surgeon: Erroll Luna, MD;  Location: [facility];  Service: General;  Laterality: Left;   PORTA CATH REMOVAL Right 03/17/2021   Procedure: PORTA CATH REMOVAL;  Surgeon: Erroll Luna, MD;  Location: [facility];  Service: General;  Laterality: Right;   REMOVAL OF BILATERAL TISSUE EXPANDERS WITH PLACEMENT OF BILATERAL BREAST IMPLANTS Bilateral 05/09/2021  Procedure: REMOVAL OF BILATERAL TISSUE EXPANDERS WITH PLACEMENT OF BILATERAL BREAST IMPLANTS;  Surgeon: Wallace Going, DO;  Location: [facility];  Service: Plastics;  Laterality: Bilateral;  90 min   TOTAL MASTECTOMY Right 03/17/2021   Procedure: TOTAL MASTECTOMY;  Surgeon: Erroll Luna, MD;  Location: [facility];  Service: General;  Laterality: Right;    Home Medications:  Allergies as of 10/07/2021        Reactions   Morphine Nausea And Vomiting, Other (See Comments)   migranes Other reaction(s): Headache Migraine, vomiting   Sertraline Hcl    REACTION: Worsened symptoms of IBS   Sulfa Antibiotics Rash   Sulfamethoxazole Rash   Sulfonamide Derivatives Rash        Medication List        Accurate as of October 07, 2021  3:03 PM. If you have any questions, ask your nurse or doctor.          STOP taking these medications    DULoxetine 30 MG capsule Commonly known as: Cymbalta Stopped by: Hollice Espy, MD       TAKE these medications    anastrozole 1 MG tablet Commonly known as: ARIMIDEX Take 1 tablet (1 mg total) by mouth daily.   cyanocobalamin 1000 MCG/ML injection Commonly known as: (VITAMIN B-12) 1000 mcg (1 mL) intramuscular injection in the thigh ( vastus lateralis) once per month.   lidocaine-prilocaine cream Commonly known as: EMLA Apply 1 application topically as needed.   LORazepam 0.5 MG tablet Commonly known as: ATIVAN Take 1 tablet (0.5 mg total) by mouth at bedtime as needed for up to 30 doses for anxiety.   MULTIVITAL PO Take 1 Dose by mouth daily.   nicotine 10 MG inhaler Commonly known as: Nicotrol FOLLOW INSTRUCTIONS ON PACKAGE AS NEEDED FOR SMOKING CESSATION   nicotine 21 mg/24hr patch Commonly known as: NICODERM CQ - dosed in mg/24 hours Apply one patch to skin once daily (use 1 patch daily)   nitrofurantoin (macrocrystal-monohydrate) 100 MG capsule Commonly known as: Macrobid Take 1 capsule (100 mg total) by mouth 2 (two) times daily. Take with food.   ondansetron 8 MG tablet Commonly known as: ZOFRAN Take 1 tablet (8 mg total) by mouth every 8 (eight) hours as needed for nausea or vomiting.   Oxycodone HCl 10 MG Tabs Take 1 tablet (10 mg total) by mouth every 6 (six) hours as needed.   pregabalin 75 MG capsule Commonly known as: Lyrica Take 1 capsule (75 mg total) by mouth 2 (two) times daily.   PROBIOTIC ACIDOPHILUS  PO Take 1 capsule by mouth daily.   rosuvastatin 5 MG tablet Commonly known as: Crestor Take 1 tablet (5 mg total) by mouth every evening.   SM Nicotine 2 MG lozenge Generic drug: nicotine polacrilex Take as directed (Take by mouth.)   tiZANidine 4 MG tablet Commonly known as: ZANAFLEX Take 4 mg by mouth 3 (three) times daily.   traZODone 50 MG tablet Commonly known as: DESYREL Take 0.5-1 tablets (25-50 mg total) by mouth at bedtime as needed for sleep.   trimethoprim 100 MG tablet Commonly known as: TRIMPEX Take 1 tablet (100 mg total) by mouth daily. Started by: Hollice Espy, MD        Allergies:  Allergies  Allergen Reactions   Morphine Nausea And Vomiting and Other (See Comments)    migranes Other reaction(s): Headache Migraine, vomiting   Sertraline Hcl     REACTION: Worsened symptoms of IBS  Sulfa Antibiotics Rash   Sulfamethoxazole Rash   Sulfonamide Derivatives Rash    Family History: Family History  Problem Relation Age of Onset   Hypertension Mother    Osteoarthritis Mother    Diverticulitis Mother    Heart failure Father    Hypertension Father    Gout Father    Diverticulitis Brother    Ovarian cancer Paternal Grandmother     Social History:  reports that she has been smoking cigarettes. She has been smoking an average of .25 packs per day. She has never used smokeless tobacco. She reports that she does not currently use alcohol. She reports that she does not use drugs.   Physical Exam: BP 136/84    Pulse 71    Ht 5' (1.524 m)    Wt 112 lb (50.8 kg)    BMI 21.87 kg/m   Constitutional:  Alert and oriented, No acute distress. HEENT: [place] AT, moist mucus membranes.  Trachea midline, no masses. Cardiovascular: No clubbing, cyanosis, or edema. Respiratory: Normal respiratory effort, no increased work of breathing. Skin: No rashes, bruises or suspicious lesions. Neurologic: Grossly intact, no focal deficits, moving all 4  extremities. Psychiatric: Normal mood and affect.  Laboratory Data:  Lab Results  Component Value Date   CREATININE 0.79 09/19/2021    Urinalysis UA today with 6-10 red blood cells otherwise unremarkable  Pertinent Imaging: CT scan reviewed, no GU pathology   Assessment & Plan:    1. Recurrent UTI Likely exacerbated by anastrozole  We discussed okay to resume d-mannose, cranberry tablets, and continue probiotics as prevention  Given the frequency of infections, she is agreed for trial of suppressive antibiotics in the form of trimethoprim and daily x6 months.  We will try to wean these off at the end of 6 months and see how it goes.  If she has any UTI symptoms in the interim, she will come to our office for evaluation for same or next day visit at our [place] location  2. Microscopic hematuria Microscopic hematuria today, may be related to recent infection  We discussed today that if her microscopic hematuria persist upon recheck in a few weeks, then recommend cystoscopy to evaluate for underlying bladder pathology including bladder cancer especially given that she is a smoker  3. Smoker Discussed that this is a risk factor for bladder cancer, recommend consideration of cessation   F/u for UA  Conley Rolls as a scribe for Hollice Espy, MD.,have documented all relevant documentation on the behalf of Hollice Espy, MD,as directed by  Hollice Espy, MD while in the presence of Hollice Espy, MD.  I have reviewed the above documentation for accuracy and completeness, and I agree with the above.   Hollice Espy, MD    [facility] [address], [place], [place] [address] [phone]

## 2021-10-07 ENCOUNTER — Encounter: Payer: Self-pay | Admitting: Oncology

## 2021-10-07 ENCOUNTER — Other Ambulatory Visit: Payer: Self-pay

## 2021-10-07 ENCOUNTER — Encounter: Payer: Self-pay | Admitting: Urology

## 2021-10-07 ENCOUNTER — Other Ambulatory Visit: Payer: Self-pay | Admitting: *Deleted

## 2021-10-07 ENCOUNTER — Other Ambulatory Visit
Admission: RE | Admit: 2021-10-07 | Discharge: 2021-10-07 | Disposition: A | Discharge status: Home / Self Care | Payer: No Typology Code available for payment source | Attending: Urology | Admitting: Urology

## 2021-10-07 ENCOUNTER — Ambulatory Visit (INDEPENDENT_AMBULATORY_CARE_PROVIDER_SITE_OTHER): Payer: No Typology Code available for payment source | Admitting: Urology

## 2021-10-07 VITALS — BP 136/84 | HR 71 | Ht 60.0 in | Wt 112.0 lb

## 2021-10-07 DIAGNOSIS — R300 Dysuria: Secondary | ICD-10-CM

## 2021-10-07 DIAGNOSIS — N390 Urinary tract infection, site not specified: Secondary | ICD-10-CM

## 2021-10-07 DIAGNOSIS — F17200 Nicotine dependence, unspecified, uncomplicated: Secondary | ICD-10-CM | POA: Diagnosis not present

## 2021-10-07 DIAGNOSIS — R3129 Other microscopic hematuria: Secondary | ICD-10-CM

## 2021-10-07 LAB — URINALYSIS, COMPLETE (UACMP) WITH MICROSCOPIC
Glucose, UA: NEGATIVE mg/dL
Ketones, ur: NEGATIVE mg/dL
Leukocytes,Ua: NEGATIVE
Nitrite: NEGATIVE
Protein, ur: NEGATIVE mg/dL
Specific Gravity, Urine: 1.025 (ref 1.005–1.030)
pH: 5.5 (ref 5.0–8.0)

## 2021-10-07 MED ORDER — TRIMETHOPRIM 100 MG PO TABS
100.0000 mg | ORAL_TABLET | Freq: Every day | ORAL | 5 refills | Status: DC
  Filled 2021-10-07: qty 30, 30d supply, fill #0
  Filled 2021-11-03: qty 30, 30d supply, fill #1
  Filled 2021-12-02: qty 30, 30d supply, fill #2
  Filled 2021-12-28: qty 30, 30d supply, fill #3
  Filled 2022-02-03: qty 30, 30d supply, fill #4
  Filled 2022-03-07: qty 30, 30d supply, fill #5

## 2021-10-07 NOTE — Patient Instructions (Addendum)
Cranberry Probiotic D-mannose   Daily trimethoprim for 6 months

## 2021-10-10 ENCOUNTER — Encounter: Payer: Self-pay | Admitting: Physical Therapy

## 2021-10-10 ENCOUNTER — Other Ambulatory Visit: Payer: Self-pay

## 2021-10-10 ENCOUNTER — Ambulatory Visit: Payer: No Typology Code available for payment source | Admitting: Physical Therapy

## 2021-10-10 DIAGNOSIS — C50912 Malignant neoplasm of unspecified site of left female breast: Secondary | ICD-10-CM

## 2021-10-10 DIAGNOSIS — L599 Disorder of the skin and subcutaneous tissue related to radiation, unspecified: Secondary | ICD-10-CM

## 2021-10-10 DIAGNOSIS — R293 Abnormal posture: Secondary | ICD-10-CM

## 2021-10-10 DIAGNOSIS — Z483 Aftercare following surgery for neoplasm: Secondary | ICD-10-CM

## 2021-10-10 DIAGNOSIS — M25612 Stiffness of left shoulder, not elsewhere classified: Secondary | ICD-10-CM

## 2021-10-10 NOTE — Therapy (Signed)
[facility] @ [place], [place], [address] Phone: [phone]   Fax:  [phone]  Physical Therapy Treatment  Patient Details  Name: Tricia Berry MRN: [id number] Date of Birth: 03-14-1971 Referring Provider (PT): Burnard Hawthorne, FNP   Encounter Date: 10/10/2021   PT End of Session - 10/10/21 1403     Visit Number 14    Number of Visits 20    Date for PT Re-Evaluation 10/27/21    PT Start Time 7564    PT Stop Time 3329    PT Time Calculation (min) 48 min    Activity Tolerance Patient tolerated treatment well    Behavior During Therapy [facility] for tasks assessed/performed             Past Medical History:  Diagnosis Date   Anxiety    Breast cancer ([facility])    Diverticulitis    Family history of ovarian cancer    GERD (gastroesophageal reflux disease)    IBS (irritable bowel syndrome)    PONV (postoperative nausea and vomiting)    severe migraine and vomiting post anesthesia   Scoliosis     Past Surgical History:  Procedure Laterality Date   ABDOMINAL HYSTERECTOMY     still has ovaries, no gyn cancer, hysterectomy due to endometriosis. NO cervix on exam 01/10/21   BREAST BIOPSY Left 09/15/2020   [place] bx of mass, path pending, Q marker   BREAST BIOPSY Left 09/15/2020   [place] bx of LN, hydromarker, path pending   BREAST RECONSTRUCTION WITH PLACEMENT OF TISSUE EXPANDER AND FLEX HD (ACELLULAR HYDRATED DERMIS) Bilateral 03/17/2021   Procedure: IMMEDIATE BILATERAL BREAST RECONSTRUCTION WITH PLACEMENT OF TISSUE EXPANDER AND FLEX HD (ACELLULAR HYDRATED DERMIS);  Surgeon: Wallace Going, DO;  Location: [facility];  Service: Plastics;  Laterality: Bilateral;   IR IMAGING GUIDED PORT INSERTION  10/01/2020   MODIFIED MASTECTOMY Left 03/17/2021   Procedure: LEFT MODIFIED RADICAL MASTECTOMY;  Surgeon: Erroll Luna, MD;  Location: [facility];  Service: General;  Laterality:  Left;   PORTA CATH REMOVAL Right 03/17/2021   Procedure: PORTA CATH REMOVAL;  Surgeon: Erroll Luna, MD;  Location: [facility];  Service: General;  Laterality: Right;   REMOVAL OF BILATERAL TISSUE EXPANDERS WITH PLACEMENT OF BILATERAL BREAST IMPLANTS Bilateral 05/09/2021   Procedure: REMOVAL OF BILATERAL TISSUE EXPANDERS WITH PLACEMENT OF BILATERAL BREAST IMPLANTS;  Surgeon: Wallace Going, DO;  Location: [facility];  Service: Plastics;  Laterality: Bilateral;  90 min   TOTAL MASTECTOMY Right 03/17/2021   Procedure: TOTAL MASTECTOMY;  Surgeon: Erroll Luna, MD;  Location: [facility];  Service: General;  Laterality: Right;    There were no vitals filed for this visit.   Subjective Assessment - 10/10/21 1309     Subjective I can tell it is more loose today. It is doing better. I have been doing my exercises.    Pertinent History L breast cancer ER+/PR+ HER2-, stage III, underwent a bilateral mastectomy and ALND on L (7/16) on 03/17/21, reconstruction in summer 2022, neuropathy of hands, lower legs and feet, completed radiation, completed radiation Dec17, 2021 to February 18, 2021    Patient Stated Goals to gain info from providers    Currently in Pain? No/denies    Pain Score 0-No pain                [facility] PT Assessment - 10/10/21 0001  AROM   Left Shoulder Flexion 176 Degrees   less tightness   Left Shoulder ABduction 178 Degrees   pt can still feel some tightness but it is improving                          [facility] Adult PT Treatment/Exercise - 10/10/21 0001       Shoulder Exercises: Supine   Horizontal ABduction Strengthening;Both;10 reps;Theraband   pt returned therapist demo   Theraband Level (Shoulder Horizontal ABduction) Level 1 (Yellow)    External Rotation Strengthening;Both;10 reps   pt returned therapist demo   Theraband Level (Shoulder External Rotation) Level 1 (Yellow)    Flexion  Strengthening;Both;10 reps   narrow and wide grip, pt returned therapist demo   Theraband Level (Shoulder Flexion) Level 1 (Yellow)    Diagonals Strengthening;Both;10 reps   pt returned therapist demo   Theraband Level (Shoulder Diagonals) Level 1 (Yellow)      Shoulder Exercises: Therapy Ball   Flexion Both;10 reps   with stretch at end range   ABduction 10 reps;Left   with stretch at end range     Manual Therapy   Myofascial Release briefly to cording in L axilla                        PT Long Term Goals - 10/10/21 1402       PT LONG TERM GOAL #1   Title Pt will return to baseline shoulder ROM measurements and not demonstrate any signs or symptoms of lymphedema.    Baseline 05/31/21- pt has 180 degrees of flex and abd bilaterally    Time 4    Period Weeks    Status Achieved      PT LONG TERM GOAL #2   Title Pt will demonstrate 165 degrees of bilateral flexion to allow pt to reach overhead.    Baseline R 143 L 101; 05/02/21- 172 on R, 175 on L    Time 4    Period Weeks    Status Achieved      PT LONG TERM GOAL #3   Title Pt will demonstrate 165 degrees of bilateral shoulder abduction to allow her to reach out the side    Baseline R 152 L 78; 05/02/21- R 180 L 179    Time 4    Period Weeks    Status Achieved      PT LONG TERM GOAL #4   Title Pt will report she is no longer having muscle spasms across her chest to allow improved comfort    Baseline 05/02/21- pt reports she is no longer having muscle spasms    Time 4    Period Weeks    Status Achieved      PT LONG TERM GOAL #5   Title Pt will be independent in a home exercise program for long term strengthening and stretching.    Time 4    Period Weeks    Status On-going      PT LONG TERM GOAL #6   Title Pt will report a 50% improvement in edema in L trunk and chest to allow improved comfort.    Baseline 05/02/21- 20% improved; 05/31/21 no edema present    Time 4    Period Weeks    Status Achieved      PT  LONG TERM GOAL #7   Title Pt will be independent in Strength ABC program for long  term stretching and strengthening after completion of radiation.    Time 4    Status On-going      PT LONG TERM GOAL #8   Title Pt will demonstrate baseline ROM (180 degrees) of flexion and abduction of LUE to allow pt to return to prior level of function.    Baseline 160 flex, 164 abd; 09/29/21- flex- 160, abd - 156 both with pain from cording in axilla    Time 4    Period Weeks                   Plan - 10/10/21 1404     Clinical Impression Statement Pt reports her cording is improving. It is becoming more centralized in her axilla. She has been able to raise arm to end range with decreased tightness. Began AAROM exercises today to continue to stretch cording at end range. Educated pt that she can obtain a set of over the door pulleys for home use. Instructed pt in supine scapular strengthening exercises today with yellow band and issued these as part of pt's home exercise program.    PT Frequency 2x / week    PT Duration 4 weeks    PT Treatment/Interventions ADLs/Self Care Home Management;Therapeutic exercise;Patient/family education;Scar mobilization;Passive range of motion;Manual techniques;Manual lymph drainage;Compression bandaging;Taping;Vasopneumatic Device    PT Next Visit Plan have pt schedule 3 month SOZO in mid March, MFR to L axilla in area of cording, STM to L pec, pec stretching, eventually instruct in Strength ABC program; Cont every 3 month L-Dex screens for up to 2 years from her SLNB    PT Home Exercise Plan post op breast exercises, instructed pt in supine dowel, supine scap series    Consulted and Agree with Plan of Care Patient             Patient will benefit from skilled therapeutic intervention in order to improve the following deficits and impairments:  Pain, Postural dysfunction, Decreased knowledge of precautions, Impaired UE functional use, Increased fascial  restricitons, Decreased strength, Decreased range of motion, Decreased scar mobility, Increased edema, Increased muscle spasms  Visit Diagnosis: Stiffness of left shoulder, not elsewhere classified  Disorder of the skin and subcutaneous tissue related to radiation, unspecified  Aftercare following surgery for neoplasm  Abnormal posture  Malignant 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, estrogen receptor positive, unspecified site of breast [facility])     Problem List Patient Active Problem List   Diagnosis Date Noted   Acquired absence of breast 08/16/2021   Dysuria 08/05/2021   B12 deficiency 07/21/2021   Atherosclerosis of aorta ([place]) 07/20/2021   Hepatic steatosis 07/20/2021   Abdominal pain 07/18/2021   Breast cancer ([place]) 03/17/2021   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   Genetic testing 11/22/2020   Family history of ovarian cancer    Candidal vulvovaginitis 11/01/2020   Low back pain 10/06/2020   Encounter for medical examination to establish care 09/26/2020   Malignant neoplasm of upper-outer quadrant of left breast in female, estrogen receptor positive ([place]) 09/26/2020   BACK STRAIN, LUMBAR 03/08/2010   INTERNAL HEMORRHOIDS 01/27/2010   ANAL FISSURE 01/27/2010   OSTEOARTHRITIS, HANDS, BILATERAL 01/04/2010   ARTHRALGIA 10/05/2009   INSOMNIA, CHRONIC 05/06/2009   ALLERGIC RHINITIS, SEASONAL 02/09/2009   TOBACCO ABUSE 12/29/2008   IBS 10/13/2008   Anxiety state 08/25/2008   DEPRESSION, RECURRENT 08/25/2008    Manus Gunning, PT 10/10/2021, 2:07 PM  [facility] @ [place] [address]  [place], [place], [address] Phone: [phone]   Fax:  [phone]  Name: Tricia Berry MRN: [id number] Date of Birth: [birth year]   Manus Gunning, PT 10/10/21 2:08 PM

## 2021-10-10 NOTE — Patient Instructions (Signed)
Over Head Pull: Narrow and Wide Grip   Cancer Rehab [phone]   On back, knees bent, feet flat, band across thighs, elbows straight but relaxed. Pull hands apart (start). Keeping elbows straight, bring arms up and over head, hands toward floor. Keep pull steady on band. Hold momentarily. Return slowly, keeping pull steady, back to start. Then do same with a wider grip on the band (past shoulder width) Repeat _10__ times. Band color __yellow____   Side Pull: Double Arm   On back, knees bent, feet flat. Arms perpendicular to body, shoulder level, elbows straight but relaxed. Pull arms out to sides, elbows straight. Resistance band comes across collarbones, hands toward floor. Hold momentarily. Slowly return to starting position. Repeat _10__ times. Band color _yellow____   Sword   On back, knees bent, feet flat, left hand on left hip, right hand above left. Pull right arm DIAGONALLY (hip to shoulder) across chest. Bring right arm along head toward floor. Thumb is pointed down when by hip and rotates upwards when by head. Hold momentarily. Slowly return to starting position. Repeat _10__ times. Do with left arm. Band color _yellow_____   Shoulder Rotation: Double Arm   On back, knees bent, feet flat, elbows tucked at sides, bent 90, hands palms up. Pull hands apart and down toward floor, keeping elbows near sides. Hold momentarily. Slowly return to starting position. Repeat __10__ times. Band color __yellow____

## 2021-10-11 ENCOUNTER — Other Ambulatory Visit: Payer: Self-pay

## 2021-10-11 ENCOUNTER — Ambulatory Visit (INDEPENDENT_AMBULATORY_CARE_PROVIDER_SITE_OTHER): Payer: No Typology Code available for payment source | Admitting: Gastroenterology

## 2021-10-11 ENCOUNTER — Ambulatory Visit: Payer: No Typology Code available for payment source | Admitting: Physical Therapy

## 2021-10-11 ENCOUNTER — Encounter: Payer: Self-pay | Admitting: Gastroenterology

## 2021-10-11 VITALS — BP 124/83 | HR 82 | Temp 98.2°F | Ht 60.0 in | Wt 110.0 lb

## 2021-10-11 DIAGNOSIS — K760 Fatty (change of) liver, not elsewhere classified: Secondary | ICD-10-CM

## 2021-10-11 DIAGNOSIS — R7989 Other specified abnormal findings of blood chemistry: Secondary | ICD-10-CM

## 2021-10-11 DIAGNOSIS — R1031 Right lower quadrant pain: Secondary | ICD-10-CM

## 2021-10-11 DIAGNOSIS — K5732 Diverticulitis of large intestine without perforation or abscess without bleeding: Secondary | ICD-10-CM

## 2021-10-11 MED ORDER — NA SULFATE-K SULFATE-MG SULF 17.5-3.13-1.6 GM/177ML PO SOLN
1.0000 | Freq: Once | ORAL | 0 refills | Status: AC
  Filled 2021-10-11: qty 354, fill #0
  Filled 2021-10-19: qty 354, 1d supply, fill #0

## 2021-10-11 NOTE — Progress Notes (Signed)
Tricia Berry [address]  [place], [place] [address]  Main: [phone]  Fax: [phone]   Gastroenterology Consultation  Referring Provider:     Burnard Hawthorne, FNP Primary Care Physician:  Burnard Hawthorne, FNP Reason for Consultation:     Diverticulitis        HPI:    Chief Complaint  Patient presents with   New Patient (Initial Visit)    Screening colonoscopy....  having right lower quadrant pain.  CT abdomen and pelvis normal    Tricia Berry is a 50 y.o. y/o female referred for consultation & management  by Dr. Vidal Schwalbe, Yvetta Coder, FNP.  Patient referred for intermittent right lower quadrant abdominal pain.  Patient reports pain is very occasional, about once or twice a week, and lasts for 3 hours.  Unrelated to meals with.  However, does feel when she has a bowel movement after that appointment.  Is reporting that since she has been on chemotherapy, her "prior IBS symptoms" which included daily diarrhea have resolved.  Is reporting 1-2 formed bowel movements a day.  No blood in stool.  Reports 2 episodes of diverticulitis in her lifetime.  Has not had a colonoscopy after the episodes of diverticulitis.  However, reports having a colonoscopy 20 years ago that was normal procedure reports not available.  Last episode of diverticulitis was years ago.  Most recent CT scan in September 2022 showed diverticulosis with no evidence of acute diverticulitis.  Mild wall thickening of the sigmoid colon reflecting scarring was reported.  Mild hepatic steatosis was reported.  Patient denies any history of heavy alcohol use.  Patient reports intermittent dysphagia to solid foods over the last year.  No dysphagia to liquids.  Completed chemotherapy earlier this year.  Has never had an EGD.  Past Medical History:  Diagnosis Date   Anxiety    Breast cancer ([place])    Diverticulitis    Family history of ovarian cancer    GERD (gastroesophageal reflux  disease)    IBS (irritable bowel syndrome)    PONV (postoperative nausea and vomiting)    severe migraine and vomiting post anesthesia   Scoliosis     Past Surgical History:  Procedure Laterality Date   ABDOMINAL HYSTERECTOMY     still has ovaries, no gyn cancer, hysterectomy due to endometriosis. NO cervix on exam 01/10/21   BREAST BIOPSY Left 09/15/2020   [place] bx of mass, path pending, Q marker   BREAST BIOPSY Left 09/15/2020   [place] bx of LN, hydromarker, path pending   BREAST RECONSTRUCTION WITH PLACEMENT OF TISSUE EXPANDER AND FLEX HD (ACELLULAR HYDRATED DERMIS) Bilateral 03/17/2021   Procedure: IMMEDIATE BILATERAL BREAST RECONSTRUCTION WITH PLACEMENT OF TISSUE EXPANDER AND FLEX HD (ACELLULAR HYDRATED DERMIS);  Surgeon: Wallace Going, DO;  Location: [facility];  Service: Plastics;  Laterality: Bilateral;   IR IMAGING GUIDED PORT INSERTION  10/01/2020   MODIFIED MASTECTOMY Left 03/17/2021   Procedure: LEFT MODIFIED RADICAL MASTECTOMY;  Surgeon: Erroll Luna, MD;  Location: [facility];  Service: General;  Laterality: Left;   PORTA CATH REMOVAL Right 03/17/2021   Procedure: PORTA CATH REMOVAL;  Surgeon: Erroll Luna, MD;  Location: [facility];  Service: General;  Laterality: Right;   REMOVAL OF BILATERAL TISSUE EXPANDERS WITH PLACEMENT OF BILATERAL BREAST IMPLANTS Bilateral 05/09/2021   Procedure: REMOVAL OF BILATERAL TISSUE EXPANDERS WITH PLACEMENT OF BILATERAL BREAST IMPLANTS;  Surgeon: Wallace Going, DO;  Location: [facility];  Service: Plastics;  Laterality: Bilateral;  90 min   TOTAL MASTECTOMY Right 03/17/2021   Procedure: TOTAL MASTECTOMY;  Surgeon: Erroll Luna, MD;  Location: [facility];  Service: General;  Laterality: Right;    Prior to Admission medications   Medication Sig Start Date End Date Taking? Authorizing Provider  anastrozole (ARIMIDEX) 1 MG tablet Take 1 tablet (1 mg total) by  mouth daily. 07/25/21  Yes Verlon Au, NP  cyanocobalamin (,VITAMIN B-12,) 1000 MCG/ML injection 1000 mcg (1 mL) intramuscular injection in the thigh ( vastus lateralis) once per month. 07/18/21  Yes Arnett, Yvetta Coder, FNP  Lactobacillus (PROBIOTIC ACIDOPHILUS PO) Take 1 capsule by mouth daily.   Yes [provider]  lidocaine-prilocaine (EMLA) cream Apply 1 application topically as needed. 06/10/21  Yes Sindy Guadeloupe, MD  LORazepam (ATIVAN) 0.5 MG tablet Take 1 tablet (0.5 mg total) by mouth at bedtime as needed for up to 30 doses for anxiety. 09/22/21  Yes Sindy Guadeloupe, MD  Multiple Vitamins-Minerals (MULTIVITAL PO) Take 1 Dose by mouth daily.   Yes [provider]  Na Sulfate-K Sulfate-Mg Sulf 17.5-3.13-1.6 GM/177ML SOLN Take 1 kit by mouth once for 1 dose. 10/11/21 10/11/21 Yes Tricia Berry B, MD  nicotine (NICODERM CQ - DOSED IN MG/24 HOURS) 21 mg/24hr patch use 1 patch daily 08/16/21 08/16/22 Yes Nicks, Ewing Schlein, RPH  nicotine (NICOTROL) 10 MG inhaler FOLLOW INSTRUCTIONS ON PACKAGE AS NEEDED FOR SMOKING CESSATION 08/15/21  Yes Jacquelin Hawking, NP  nicotine polacrilex (SM NICOTINE) 2 MG lozenge Take by mouth. 08/16/21  Yes Nicks, Ewing Schlein, RPH  ondansetron (ZOFRAN) 8 MG tablet Take 1 tablet (8 mg total) by mouth every 8 (eight) hours as needed for nausea or vomiting. 09/27/21  Yes Sindy Guadeloupe, MD  Oxycodone HCl 10 MG TABS Take 1 tablet (10 mg total) by mouth every 6 (six) hours as needed. 09/23/21  Yes Sindy Guadeloupe, MD  pregabalin (LYRICA) 75 MG capsule Take 1 capsule (75 mg total) by mouth 2 (two) times daily. 07/25/21  Yes Sindy Guadeloupe, MD  rosuvastatin (CRESTOR) 5 MG tablet Take 1 tablet (5 mg total) by mouth every evening. 08/05/21  Yes Arnett, Yvetta Coder, FNP  tiZANidine (ZANAFLEX) 4 MG tablet Take 4 mg by mouth 3 (three) times daily. 08/12/21  Yes [provider]  traZODone (DESYREL) 50 MG tablet Take 0.5-1 tablets (25-50 mg total) by mouth at  bedtime as needed for sleep. 03/14/21  Yes Arnett, Yvetta Coder, FNP  trimethoprim (TRIMPEX) 100 MG tablet Take 1 tablet (100 mg total) by mouth daily. 10/07/21  Yes Hollice Espy, MD    Family History  Problem Relation Age of Onset   Hypertension Mother    Osteoarthritis Mother    Diverticulitis Mother    Heart failure Father    Hypertension Father    Gout Father    Diverticulitis Brother    Ovarian cancer Paternal Grandmother      Social History   Tobacco Use   Smoking status: Every Day    Packs/day: 0.25    Types: Cigarettes   Smokeless tobacco: Never   Tobacco comments:    Pt trying to quit now. Has reduced amount significantly  Vaping Use   Vaping Use: Never used  Substance Use Topics   Alcohol use: Not Currently   Drug use: No    Allergies as of 10/11/2021 - Review Complete 10/11/2021  Allergen Reaction Noted   Morphine Nausea  And Vomiting and Other (See Comments) 08/25/2008   Sertraline hcl     Sulfa antibiotics Rash 04/26/2021   Sulfamethoxazole Rash 12/15/2014   Sulfonamide derivatives Rash 08/25/2008    Review of Systems:    All systems reviewed and negative except where noted in HPI.   Physical Exam:  Constitutional: General:   Alert,  Well-developed, well-nourished, pleasant and cooperative in NAD BP 124/83    Pulse 82    Temp 98.2 F (36.8 C) (Oral)    Ht 5' (1.524 m)    Wt 110 lb (49.9 kg)    BMI 21.48 kg/m   Eyes:  Sclera clear, no icterus.   Conjunctiva pink. PERRLA  Ears:  No scars, lesions or masses, Normal auditory acuity. Nose:  No deformity, discharge, or lesions. Mouth:  No deformity or lesions, oropharynx pink & moist.  Neck:  Supple; no masses or thyromegaly.  Respiratory: Normal respiratory effort, Normal percussion  Gastrointestinal: Soft, non-tender and non-distended without masses, hepatosplenomegaly or hernias noted.  No guarding or rebound tenderness.     Cardiac: No clubbing or edema.  No cyanosis. Normal posterior tibial  pedal pulses noted.  Lymphatic:  No significant cervical or axillary adenopathy.  Psych:  Alert and cooperative. Normal mood and affect.  Musculoskeletal:  Normal gait. Head normocephalic, atraumatic. Symmetrical without gross deformities. 5/5 Upper and Lower extremity strength bilaterally.  Skin: Warm. Intact without significant lesions or rashes. No jaundice.  Neurologic:  Face symmetrical, tongue midline, Normal sensation to touch;  grossly normal neurologically.  Psych:  Alert and oriented x3, Alert and cooperative. Normal mood and affect.   Labs: CBC    Component Value Date/Time   WBC 5.4 09/05/2021 0939   RBC 4.74 09/05/2021 0939   HGB 13.9 09/05/2021 0939   HGB 14.3 08/23/2020 1033   HCT 43.1 09/05/2021 0939   HCT 42.9 08/23/2020 1033   PLT 240 09/05/2021 0939   PLT 222 08/23/2020 1033   MCV 90.9 09/05/2021 0939   MCV 91 08/23/2020 1033   MCH 29.3 09/05/2021 0939   MCHC 32.3 09/05/2021 0939   RDW 12.9 09/05/2021 0939   RDW 13.1 08/23/2020 1033   LYMPHSABS 1.8 09/05/2021 0939   LYMPHSABS 4.2 (H) 08/23/2020 1033   MONOABS 0.4 09/05/2021 0939   EOSABS 0.1 09/05/2021 0939   EOSABS 0.1 08/23/2020 1033   BASOSABS 0.0 09/05/2021 0939   BASOSABS 0.1 08/23/2020 1033   CMP     Component Value Date/Time   NA 141 09/19/2021 1050   K 3.8 09/19/2021 1050   CL 104 09/19/2021 1050   CO2 27 09/19/2021 1050   GLUCOSE 113 (H) 09/19/2021 1050   BUN 8 09/19/2021 1050   CREATININE 0.79 09/19/2021 1050   CALCIUM 9.2 09/19/2021 1050   PROT 6.4 09/19/2021 1050   ALBUMIN 4.0 09/19/2021 1050   AST 17 09/19/2021 1050   ALT 12 09/19/2021 1050   ALKPHOS 84 09/19/2021 1050   BILITOT 0.4 09/19/2021 1050   GFRNONAA >60 09/05/2021 0939   GFRAA >90 05/09/2012 1731    Imaging Studies: No results found.  Assessment and Plan:   Tricia Berry is a 50 y.o. y/o female has been referred for right lower quadrant abdominal pain  September 2022 CT scan did not show any evidence  of acute diverticulitis and pain symptoms are very intermittent and unlikely to be from diverticulitis.  Given that symptoms do get better after bowel movement, may be due to underlying constipation or incomplete evacuation.  High-fiber diet recommended  Colonoscopy indicated both for screening and due to previous episodes of diverticulitis after which she never had a colonoscopy.  EGD indicated for dysphagia  Finding of fatty liver discussed with the patient.  Patient denies any history of heavy alcohol use.  Fatty liver work-up ordered  Patient hass already lost weight with her baseline usually being around 120, but since she had chemotherapy she did have changes in appetite and is trying to actually gain weight  Patient advised to avoid hepatotoxic drugs including heavy alcohol use, to prevent hepatic steatosis from worsening to cirrhosis  I have discussed alternative options, risks & benefits,  which include, but are not limited to, bleeding, infection, perforation,respiratory complication & drug reaction.  The patient agrees with this plan & written consent will be obtained.       Dr Tricia Berry  Speech recognition software was used to dictate the above note.

## 2021-10-13 ENCOUNTER — Ambulatory Visit
Admission: RE | Admit: 2021-10-13 | Discharge: 2021-10-13 | Disposition: A | Discharge status: Home / Self Care | Payer: No Typology Code available for payment source | Source: Ambulatory Visit | Attending: Student | Admitting: Student

## 2021-10-13 ENCOUNTER — Encounter: Payer: No Typology Code available for payment source | Admitting: Physical Therapy

## 2021-10-13 DIAGNOSIS — M419 Scoliosis, unspecified: Secondary | ICD-10-CM | POA: Diagnosis present

## 2021-10-13 DIAGNOSIS — M5441 Lumbago with sciatica, right side: Secondary | ICD-10-CM | POA: Diagnosis present

## 2021-10-13 DIAGNOSIS — M5442 Lumbago with sciatica, left side: Secondary | ICD-10-CM | POA: Insufficient documentation

## 2021-10-13 DIAGNOSIS — G8929 Other chronic pain: Secondary | ICD-10-CM | POA: Diagnosis present

## 2021-10-14 LAB — FERRITIN: Ferritin: 199 ng/mL — ABNORMAL HIGH (ref 15–150)

## 2021-10-14 LAB — IGG: IgG (Immunoglobin G), Serum: 784 mg/dL (ref 586–1602)

## 2021-10-14 LAB — MITOCHONDRIAL/SMOOTH MUSCLE AB PNL
Mitochondrial Ab: <20 Units (ref 0.0–20.0)
Smooth Muscle Ab: 6 Units (ref 0–19)

## 2021-10-14 LAB — ANTI-MICROSOMAL ANTIBODY LIVER / KIDNEY: LKM1 Ab: 0.8 Units (ref 0.0–20.0)

## 2021-10-14 LAB — ANA: ANA Titer 1: NEGATIVE

## 2021-10-14 LAB — IRON AND TIBC
Iron Saturation: 15 % (ref 15–55)
Iron: 51 ug/dL (ref 27–159)
Total Iron Binding Capacity: 333 ug/dL (ref 250–450)
UIBC: 282 ug/dL (ref 131–425)

## 2021-10-14 LAB — CERULOPLASMIN: Ceruloplasmin: 31.8 mg/dL (ref 19.0–39.0)

## 2021-10-14 LAB — HEPATITIS B SURFACE ANTIGEN: Hepatitis B Surface Ag: NEGATIVE

## 2021-10-14 LAB — HEPATITIS A ANTIBODY, TOTAL: hep A Total Ab: POSITIVE — AB

## 2021-10-14 LAB — HEPATITIS B CORE ANTIBODY, TOTAL: Hep B Core Total Ab: NEGATIVE

## 2021-10-14 LAB — HEPATITIS C ANTIBODY: Hep C Virus Ab: <0.1 s/co ratio (ref 0.0–0.9)

## 2021-10-14 LAB — HEPATITIS B SURFACE ANTIBODY,QUALITATIVE: Hep B Surface Ab, Qual: NONREACTIVE

## 2021-10-18 ENCOUNTER — Ambulatory Visit: Payer: No Typology Code available for payment source | Admitting: Physical Therapy

## 2021-10-19 ENCOUNTER — Inpatient Hospital Stay (HOSPITAL_BASED_OUTPATIENT_CLINIC_OR_DEPARTMENT_OTHER): Payer: No Typology Code available for payment source | Admitting: Oncology

## 2021-10-19 ENCOUNTER — Other Ambulatory Visit: Payer: Self-pay

## 2021-10-19 ENCOUNTER — Inpatient Hospital Stay: Payer: No Typology Code available for payment source

## 2021-10-19 ENCOUNTER — Other Ambulatory Visit: Payer: Self-pay | Admitting: *Deleted

## 2021-10-19 ENCOUNTER — Inpatient Hospital Stay: Payer: No Typology Code available for payment source | Admitting: Oncology

## 2021-10-19 ENCOUNTER — Telehealth: Payer: Self-pay | Admitting: *Deleted

## 2021-10-19 DIAGNOSIS — U071 COVID-19: Secondary | ICD-10-CM

## 2021-10-19 DIAGNOSIS — Z170 Estrogen receptor positive status [ER+]: Secondary | ICD-10-CM

## 2021-10-19 DIAGNOSIS — R3129 Other microscopic hematuria: Secondary | ICD-10-CM

## 2021-10-19 DIAGNOSIS — C50412 Malignant neoplasm of upper-outer quadrant of left female breast: Secondary | ICD-10-CM | POA: Diagnosis not present

## 2021-10-19 MED ORDER — NIRMATRELVIR/RITONAVIR (PAXLOVID)TABLET
3.0000 | ORAL_TABLET | Freq: Two times a day (BID) | ORAL | 0 refills | Status: AC
  Filled 2021-10-19: qty 30, 5d supply, fill #0

## 2021-10-19 NOTE — Telephone Encounter (Signed)
The patient called stating that she has had aches, chills and sweats. The person she lives with tested positive for covid. She does not need to come today. She does need to get a covid test. If it is positive then she can call us back and get NP to get her medicine for it. If the home test if negative then she needs to get PCR covid by PCP. She is ok with this and will let us know the results. We will make appt for next and let her know

## 2021-10-19 NOTE — Progress Notes (Signed)
Symptom Management Consult note [facility]  [phone] [phone]  Patient Care Team: Burnard Hawthorne, FNP as PCP - General (Family Medicine) Rico Junker, RN as Oncology Nurse Navigator Sindy Guadeloupe, MD as Consulting Physician (Hematology and Oncology)   Name of the patient: Tricia Berry  [id number]  1971-10-06   Date of visit: 10/19/2021  Diagnosis: Breast Cancer   Chief Complaint: COVID positive  I connected with Tricia Berry on 10/19/21 at 12:30 PM EST by telephone visit and verified that I am speaking with the correct person using two identifiers.   I discussed the limitations, risks, security and privacy concerns of performing an evaluation and management service by telemedicine and the availability of in-person appointments. I also discussed with the patient that there may be a patient responsible charge related to this service. The patient expressed understanding and agreed to proceed.   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  She then noticed that her lump is gradually getting larger with an area of ulceration on the skin and underwent a diagnostic bilateral mammogram on 09/08/2020 which showed hypoechoic interconnected masses in the left breast from 10:00 to 2 o'clock position spanning at least an area of 4.8 cm.  Ultrasound also showed 5 abnormal lymph nodes in the axilla with cortical thickening.  Both the mass and the lymph nodes were biopsied and was consistent with invasive mammary carcinoma grade 2.  Tumor was ER +91- 100%, PR +11 to 20% and HER-2 negative.  Ki-67 15%. Patient  will be meeting [name] from [facility] surgery to discuss surgical management.  She is here for medical oncology recommendations.  In terms of her general health patient is otherwise doing well and does not have significant comorbidities.  She does endorse significant pain in the area of her left breast.  Tylenol and Motrin has not been helping her with this pain.  She lives with her husband and 2 children who have special needs.  No prior history of abnormal breast mammograms or breast biopsies.   PET CT scan showed hypermetabolism in the area of the left breast but no evidence of hypermetabolism in the left axilla Or evidence of distant metastatic disease.   Neoadjuvant dose dense ACT chemotherapy started on 10/08/2020. Interim ultrasound after 4 cycles of dose dense AC chemotherapy showed overall decrease in volume of the tumor.  Nodularity previously seen on ultrasound was also decreased in size.   Patient completed neoadjuvant AC Taxol chemotherapy and underwent bilateral mastectomy with reconstruction.Final pathology showed fibrocystic changes in the right breast with no malignancy.  3.6 cm invasive mammary carcinoma in the left breast.  7 out of 16 lymph nodes positive for malignancy.  Treatment effect in the breast present but not robust.  Treatment effect in the lymph nodes minimal.  Margins negative.  Overall grade 2.  Extranodal extension present.  ER greater than 90% positive, PR 15% positive.  HER2 +2 equivocal and positive by FISH  Subjective Data: Patient presents today after testing positive for Covid by home test. Symptoms started about 1 day ago. Having body aches, chills and sweats.  Developed a headache. Has congestion and cough. Cough worse at bedtime. Has only taken tylenol which was helpful. Has been drinking not eating that well.  States her boyfriend is also positive.  ECOG: 1 - Symptomatic but completely ambulatory  Review of Systems  Constitutional:  Positive for  chills, diaphoresis, fever and malaise/fatigue. Negative for weight loss.  HENT:  Positive for congestion. Negative for ear pain and tinnitus.   Eyes: Negative.  Negative for blurred vision and double vision.  Respiratory:  Positive for cough and shortness of breath. Negative for sputum production.   Cardiovascular: Negative.  Negative for chest pain, palpitations and leg swelling.  Gastrointestinal: Negative.  Negative for abdominal pain, constipation, diarrhea, nausea and vomiting.  Genitourinary:  Negative for dysuria, frequency and urgency.  Musculoskeletal:  Negative for back pain and falls.  Skin: Negative.  Negative for rash.  Neurological: Negative.  Negative for weakness and headaches.  Endo/Heme/Allergies: Negative.  Does not bruise/bleed easily.  Psychiatric/Behavioral: Negative.  Negative for depression. The patient is not nervous/anxious and does not have insomnia.    Physical Exam Neurological:     Mental Status: She is alert and oriented to person, place, and time.    Assessment-Tricia Berry is a 50 year old female who presents via virtual telephone appointment for recent COVID-positive home test results.  Sounds well on the phone.  Does not appear to be in distress.  Given she is currently still on treatment with Herceptin and Arimidex for breast cancer would recommend treating for COVID with an oral antiviral.  Discussed both options.  Plan- Reviewed most recent lab work from 09/05/2021 which shows a normal kidney function with a GFR greater than 60.  She would benefit from full Paxlovid oral antiviral.  New prescription for Paxilovid 3 tabs by mouth twice daily x5 days sent to [facility] employee pharmacy.  Cough-recommend over-the-counter Delsym or Robitussin.  Fever-Tylenol as needed.  Disposition-her next appointment will likely need to be pushed out but I will let [name] take care of this.  I spent 15 minutes dedicated to the care of this patient (face-to-face and  non-face-to-face) on the date of the encounter to include what is described in the assessment and plan.  Faythe Casa, NP 10/19/2021 12:57 PM

## 2021-10-20 ENCOUNTER — Other Ambulatory Visit: Payer: No Typology Code available for payment source

## 2021-10-20 ENCOUNTER — Encounter: Payer: No Typology Code available for payment source | Admitting: Physical Therapy

## 2021-10-20 ENCOUNTER — Encounter: Payer: Self-pay | Admitting: *Deleted

## 2021-10-21 ENCOUNTER — Telehealth: Payer: Self-pay | Admitting: *Deleted

## 2021-10-21 ENCOUNTER — Other Ambulatory Visit: Payer: Self-pay | Admitting: *Deleted

## 2021-10-21 ENCOUNTER — Other Ambulatory Visit: Payer: Self-pay

## 2021-10-21 MED ORDER — HYDROCODONE BIT-HOMATROP MBR 5-1.5 MG/5ML PO SOLN
5.0000 mL | Freq: Four times a day (QID) | ORAL | 0 refills | Status: DC | PRN
  Filled 2021-10-21: qty 120, 6d supply, fill #0

## 2021-10-21 NOTE — Telephone Encounter (Signed)
Pt called and said that she is coughing and has tried delsym, tessalon pearls and robitussin and nothin gis working. Hard to sleep due to the coughing. I spoke Lauren NP and she will send in hycodan and pt wants it sent to the [facility] pharmacy and I called and they do have it in stock and they will bring it out to car due to covid. The hycodan  came up with possible interaction from her allergy of morphine. I called the pt. Back and she said that she has had hydrocodone in the past and it does not bother her. Pt will come and get med

## 2021-10-24 ENCOUNTER — Other Ambulatory Visit: Payer: Self-pay

## 2021-10-24 ENCOUNTER — Other Ambulatory Visit: Payer: Self-pay | Admitting: Oncology

## 2021-10-24 ENCOUNTER — Other Ambulatory Visit: Payer: Self-pay | Admitting: Nurse Practitioner

## 2021-10-25 ENCOUNTER — Other Ambulatory Visit: Payer: No Typology Code available for payment source

## 2021-10-25 ENCOUNTER — Encounter: Payer: Self-pay | Admitting: Oncology

## 2021-10-25 ENCOUNTER — Other Ambulatory Visit: Payer: Self-pay

## 2021-10-25 ENCOUNTER — Ambulatory Visit: Payer: No Typology Code available for payment source

## 2021-10-25 ENCOUNTER — Ambulatory Visit: Payer: No Typology Code available for payment source | Admitting: Physical Therapy

## 2021-10-25 ENCOUNTER — Ambulatory Visit: Payer: No Typology Code available for payment source | Admitting: Oncology

## 2021-10-25 DIAGNOSIS — R3129 Other microscopic hematuria: Secondary | ICD-10-CM

## 2021-10-25 LAB — URINALYSIS, COMPLETE
Bilirubin, UA: NEGATIVE
Glucose, UA: NEGATIVE
Nitrite, UA: NEGATIVE
Specific Gravity, UA: 1.015 (ref 1.005–1.030)
Urobilinogen, Ur: 0.2 mg/dL (ref 0.2–1.0)
pH, UA: 6.5 (ref 5.0–7.5)

## 2021-10-25 LAB — MICROSCOPIC EXAMINATION

## 2021-10-25 MED ORDER — PREGABALIN 75 MG PO CAPS
75.0000 mg | ORAL_CAPSULE | Freq: Two times a day (BID) | ORAL | 2 refills | Status: DC
  Filled 2021-10-25: qty 60, 30d supply, fill #0

## 2021-10-25 MED ORDER — ONDANSETRON HCL 8 MG PO TABS
8.0000 mg | ORAL_TABLET | Freq: Three times a day (TID) | ORAL | 0 refills | Status: DC | PRN
  Filled 2021-10-25: qty 60, 20d supply, fill #0

## 2021-10-25 MED ORDER — OXYCODONE HCL 10 MG PO TABS
10.0000 mg | ORAL_TABLET | Freq: Four times a day (QID) | ORAL | 0 refills | Status: DC | PRN
  Filled 2021-10-25: qty 120, 30d supply, fill #0

## 2021-10-26 ENCOUNTER — Other Ambulatory Visit: Payer: Self-pay

## 2021-10-26 ENCOUNTER — Encounter: Payer: No Typology Code available for payment source | Admitting: Physical Therapy

## 2021-10-26 ENCOUNTER — Other Ambulatory Visit: Payer: Self-pay | Admitting: *Deleted

## 2021-10-26 DIAGNOSIS — C50412 Malignant neoplasm of upper-outer quadrant of left female breast: Secondary | ICD-10-CM

## 2021-10-26 DIAGNOSIS — Z170 Estrogen receptor positive status [ER+]: Secondary | ICD-10-CM

## 2021-10-26 NOTE — Progress Notes (Unsigned)
Echo

## 2021-10-27 ENCOUNTER — Telehealth: Payer: Self-pay | Admitting: *Deleted

## 2021-10-27 LAB — CULTURE, URINE COMPREHENSIVE

## 2021-10-27 NOTE — Telephone Encounter (Signed)
Faxed medical records (last office note) per [employer]'s request for continuity of care/release

## 2021-10-28 ENCOUNTER — Encounter: Payer: Self-pay | Admitting: Oncology

## 2021-10-28 ENCOUNTER — Other Ambulatory Visit: Payer: Self-pay

## 2021-10-28 ENCOUNTER — Other Ambulatory Visit: Payer: Self-pay | Admitting: *Deleted

## 2021-10-28 MED ORDER — ANASTROZOLE 1 MG PO TABS
1.0000 mg | ORAL_TABLET | Freq: Every day | ORAL | 3 refills | Status: DC
  Filled 2021-10-28: qty 30, 30d supply, fill #0
  Filled 2021-11-29: qty 30, 30d supply, fill #1
  Filled 2021-12-28: qty 30, 30d supply, fill #2
  Filled 2022-01-27: qty 30, 30d supply, fill #3

## 2021-11-01 ENCOUNTER — Encounter: Payer: Self-pay | Admitting: Physical Therapy

## 2021-11-01 ENCOUNTER — Ambulatory Visit: Payer: No Typology Code available for payment source | Attending: Family | Admitting: Physical Therapy

## 2021-11-01 DIAGNOSIS — M6281 Muscle weakness (generalized): Secondary | ICD-10-CM | POA: Insufficient documentation

## 2021-11-01 DIAGNOSIS — M545 Low back pain, unspecified: Secondary | ICD-10-CM | POA: Insufficient documentation

## 2021-11-01 DIAGNOSIS — R262 Difficulty in walking, not elsewhere classified: Secondary | ICD-10-CM | POA: Insufficient documentation

## 2021-11-01 DIAGNOSIS — M533 Sacrococcygeal disorders, not elsewhere classified: Secondary | ICD-10-CM | POA: Insufficient documentation

## 2021-11-01 DIAGNOSIS — G8929 Other chronic pain: Secondary | ICD-10-CM | POA: Diagnosis present

## 2021-11-01 NOTE — Therapy (Addendum)
[facility] [address], [place], [address] Phone: [phone]   Fax:  [phone]  Physical Therapy Treatment  Patient Details  Name: Tricia Berry MRN: [id number] Date of Birth: 04-08-1971 Referring Provider (PT): Burnard Hawthorne, FNP   Encounter Date: 11/01/2021   PT End of Session - 11/01/21 0953     Visit Number 7    Number of Visits 24    Date for PT Re-Evaluation 11/17/21    Authorization Type [place] [employer]OC[employer] reporting period from 08/25/2021    Progress Note Due on Visit 10    PT Start Time 0947    PT Stop Time 1027    PT Time Calculation (min) 40 min    Activity Tolerance Patient tolerated treatment well    Behavior During Therapy [facility] for tasks assessed/performed             Past Medical History:  Diagnosis Date   Anxiety    Breast cancer (Tricia Berry)    Diverticulitis    Family history of ovarian cancer    GERD (gastroesophageal reflux disease)    IBS (irritable bowel syndrome)    PONV (postoperative nausea and vomiting)    severe migraine and vomiting post anesthesia   Scoliosis     Past Surgical History:  Procedure Laterality Date   ABDOMINAL HYSTERECTOMY     still has ovaries, no gyn cancer, hysterectomy due to endometriosis. NO cervix on exam 01/10/21   BREAST BIOPSY Left 09/15/2020   [place] bx of mass, path pending, Q marker   BREAST BIOPSY Left 09/15/2020   [place] bx of LN, hydromarker, path pending   BREAST RECONSTRUCTION WITH PLACEMENT OF TISSUE EXPANDER AND FLEX HD (ACELLULAR HYDRATED DERMIS) Bilateral 03/17/2021   Procedure: IMMEDIATE BILATERAL BREAST RECONSTRUCTION WITH PLACEMENT OF TISSUE EXPANDER AND FLEX HD (ACELLULAR HYDRATED DERMIS);  Surgeon: Wallace Going, DO;  Location: [place];  Service: Plastics;  Laterality: Bilateral;   IR IMAGING GUIDED PORT INSERTION  10/01/2020   MODIFIED MASTECTOMY Left 03/17/2021   Procedure: LEFT MODIFIED RADICAL  MASTECTOMY;  Surgeon: Erroll Luna, MD;  Location: [place];  Service: General;  Laterality: Left;   PORTA CATH REMOVAL Right 03/17/2021   Procedure: PORTA CATH REMOVAL;  Surgeon: Erroll Luna, MD;  Location: [place];  Service: General;  Laterality: Right;   REMOVAL OF BILATERAL TISSUE EXPANDERS WITH PLACEMENT OF BILATERAL BREAST IMPLANTS Bilateral 05/09/2021   Procedure: REMOVAL OF BILATERAL TISSUE EXPANDERS WITH PLACEMENT OF BILATERAL BREAST IMPLANTS;  Surgeon: Wallace Going, DO;  Location: [place];  Service: Plastics;  Laterality: Bilateral;  90 min   TOTAL MASTECTOMY Right 03/17/2021   Procedure: TOTAL MASTECTOMY;  Surgeon: Erroll Luna, MD;  Location: [facility];  Service: General;  Laterality: Right;    There were no vitals filed for this visit.   Subjective Assessment - 11/01/21 0949     Subjective Patient reports she has 4/10 pain in her back and her left knee is hurting as well (up to 6/10 when she is going down stairs this morning). She had an injection in her back and does not feel that helped her back at all, although her right hip has been feeling better after it. She has been away from PT due to contracting [place]. She has felt very tired and has not been able to do her HEP for a couple of weeks. Today is her first attempt to  exercise since getting COVID19.    Pertinent History Patient is a 51 y.o. female who presents to outpatient physical therapy with a referral for medical diagnosis Right-sided low back pain without sciatica, unspecified chronicity. This patient's chief complaints consist of worsening chronic R sided low back pain that intermittently radiates down the leg and across the back to the left side leading to the following functional deficits: sudden grabbing stops her in her tracks and she must sit down, stretching, bending, work responsibilities, dressing, petting cats, caring for cats,  lifting, moving patients, yardwork, gardening, functional mobility, etc.  Relevant past medical history and comorbidities include L breast cancer stage IIIb (dx Nov 2021. ER+/PR+ HER2-, underwent a bilateral mastectomy and ALND on L (7/16) on 03/17/21, reconstruction in summer 2022, neuropathy of hands, lower legs and feet, completed chemo, completed radiation Dec17, 2021 to February 18, 2021, currently taking oral medication and infusions), scoliosis, internal hemorrhoids, atherosclerosis of aorta, hepatic steatosis, chemotherapy -induced neuropathy, anxiety/depression, chronic insomnia, arthralgia, B12 deficiency, GERD, IBS, diverticulitis, current smoker (trying to quit), UTIs, abdominal hysterectomy.Patient denies hx of  stroke, seizures, lung problem, major cardiac events, diabetes, changes in bowel or bladder problems, new onset stumbling or dropping things (that appears related to cancer treatments), spinal surgeries, osteoporosis (but is taking medication that can weaken bones).    Limitations House hold activities;Lifting;Standing;Walking;Other (comment)   sudden grabbing stops her in her tracks and she must sit down, stretching, bending, work responsibilities, dressing, petting cats, caring for cats, lifting, moving patients, yardwork, gardening, functional mobility, etc.   Diagnostic tests R hip xray report 08/05/2021: negative. Lumbar xray report 10/06/2021: "IMPRESSION:  Severe lumbar spine scoliosis concave right. Diffuse multilevel  degenerative change. No acute or focal bony abnormality identified." CT abdomen/pelvis report 07/18/2021: "Musculoskeletal: No acute or destructive bony lesions. Left convex  thoracolumbar scoliosis. Reconstructed images demonstrate no  additional findings. IMPRESSION:  1. Diverticulosis of the rectosigmoid colon, with no evidence of  acute diverticulitis. Mild wall thickening of the sigmoid colon  likely reflects scarring from previous bouts of diverticulitis.  2. Normal  appendix.  3. Mild hepatic steatosis.  4.  Aortic Atherosclerosis (ICD10-I70.0)."    Currently in Pain? Yes    Pain Score 6                TREATMENT:   denies latex sensitivity   Therapeutic exercise: to centralize symptoms and improve ROM, strength, muscular endurance, and activity tolerance required for successful completion of functional activities.  - NuStep level 1 using bilateral lower extremities. Seat setting 4. For improved extremity mobility, muscular endurance, and activity tolerance; and to induce the analgesic effect of aerobic exercise, stimulate improved joint nutrition, and prepare body structures and systems for following interventions. x 8 minutes. Average SPM = 53. - standing diagonal hip abduction with B UE support, 2x10 each side with YTB around ankles.   - sit <> stand form 18 in chair. 2x10  needed rest after first set due to feeling too hot and with nausea.  - runner's step up to 6 inch step. 1x10 each side with U UE support.  - hooklying bridge with hip abduction against YTB around distal thighs, 1x10 with 5 second hold.   Pt required multimodal cuing for proper technique and to facilitate improved neuromuscular control, strength, range of motion, and functional ability resulting in improved performance and form.   HOME EXERCISE PROGRAM Access Code: [email or web address] URL: [email or web address] Date: 10/04/2021 Prepared by: Rosita Kea   Exercises Bridge with Hip Abduction  and Resistance - Ground Touches - 3 x weekly - 1 sets - 20 reps - 5 seconds hold Supine Transversus Abdominis Bracing with Leg Extension - 3 x weekly - 3 sets - 10 reps Single Leg Bridge - 3 x weekly - 3 sets - 10 reps - 1-5 seconds hold Reverse Curl - 3 x weekly - 1 sets - 10-20 reps - 5-10 seconds hold Squat with Chair Touch - 3 x weekly - 3 sets - 10 reps Diagonal Hip Extension with Resistance - 3 x weekly - 3 sets - 10 reps - 1 second hold Runner's Step Up/Down - 3 x weekly - 3  sets - 10 reps     PT Education - 11/01/21 0952     Education Details Exercise purpose/form. Self management techniques    Person(s) Educated Patient    Methods Explanation;Demonstration;Tactile cues;Verbal cues    Comprehension Verbalized understanding;Returned demonstration;Verbal cues required;Tactile cues required;Need further instruction              PT Short Term Goals - 09/27/21 0957       PT SHORT TERM GOAL #1   Title Be independent with initial home exercise program for self-management of symptoms.    Baseline initial HEP to be provided at visit 2 as appropriate (08/25/2021); initial HEP provided at visit 2 (08/30/2021);    Time 2    Period Weeks    Status Achieved    Target Date 09/08/21               PT Long Term Goals - 11/01/21 0956       PT LONG TERM GOAL #1   Title Be independent with a long-term home exercise program for self-management of symptoms.    Baseline initial HEP to be provided at visit 2 as appropriate (08/25/2021); initial HEP provided on visit 2 (08/30/2021); HEP updated to be more comprehensive for long term use with tentative discharge today (10/04/2021);    Time 12    Period Weeks    Status Partially Met   TARGET DATE FOR ALL LONG TERM GOALS: 11/17/2021     PT LONG TERM GOAL #2   Title Demonstrate improved FOTO to equal or greater than 71 by visit #10 to demonstrate improvement in overall condition and self-reported functional ability.    Baseline 54 (08/25/2021); 56 at visit #6 (10/04/2021);    Time 12    Period Weeks    Status Partially Met      PT LONG TERM GOAL #3   Title Have full lumbar AROM with no compensations outside of those expected from scoliosis or increase in pain in all planes except intermittent end range discomfort to improve ability  to complete valued activities such bending, carrying, rolling, dressing, etc.    Baseline painful and limited - see objective (08/25/2021);    Time 12    Period Weeks    Status On-going       PT LONG TERM GOAL #4   Title Improve B hip strength to equal or greater than 4+/5 with no increase in pain to improve patients ability to complete functional tasks such as working, dressing, walking, yardwork with less difficulty.    Baseline painful and weak - see objective (08/25/2021);    Time 12    Period Weeks    Status On-going      PT LONG TERM GOAL #5   Title Reduce pain with functional activities to equal or less than 1/10 to allow patient to complete  usual activities including housework, walking, bending, sleeping with less difficulty.    Baseline up to 10/10 (08/25/2021);    Time 12    Period Weeks    Status On-going      PT LONG TERM GOAL #6   Title Complete community, work and/or recreational activities without limitation due to current condition.    Baseline sudden grabbing stops her in her tracks and she must sit down, stretching, bending, work responsibilities, dressing, petting cats, caring for cats, lifting, moving patients, yardwork, gardening, functional mobility, etc (08/25/2021);    Time 12    Period Weeks    Status On-going                   Plan - 11/01/21 0955     Clinical Impression Statement Patient returns to PT after being sick with COVID19 and away from PT for 3 weeks. She was feeling very easily fatigued and had some difficulty with L knee pain during today's session. Exercises were modified to accommodate increased fatigue. Patient did report some nausea but recovered with rest and water. Patient would benefit from continued management of limiting condition by skilled physical therapist to address remaining impairments and functional limitations to work towards stated goals and return to PLOF or maximal functional independence.    Personal Factors and Comorbidities Comorbidity 3+;Past/Current Experience;Profession;Fitness;Social Background;Time since onset of injury/illness/exacerbation    Comorbidities Relevant past medical history and  comorbidities include L breast cancer stage IIIb (dx Nov 2021. ER+/PR+ HER2-, underwent a bilateral mastectomy and ALND on L (7/16) on 03/17/21, reconstruction in summer 2022, neuropathy of hands, lower legs and feet, completed chemo, completed radiation Dec17, 2021 to February 18, 2021, currently taking oral medication and infusions), scoliosis, internal hemorrhoids, atherosclerosis of aorta, hepatic steatosis, chemotherapy -induced neuropathy, anxiety/depression, chronic insomnia, arthralgia, B12 deficiency, GERD, IBS, diverticulitis, current smoker (trying to quit), UTIs, abdominal hysterectomy    Examination-Activity Limitations Bed Mobility;Lift;Squat;Bend;Locomotion Level;Stand;Caring for Others;Carry;Transfers;Sleep;Dressing    Examination-Participation Restrictions Laundry;Shop;Cleaning;Community Activity;Meal Prep;Occupation;Yard Work;Interpersonal Relationship;Other   sudden grabbing stops her in her tracks and she must sit down, stretching, bending, work responsibilities, dressing, petting cats, caring for cats, lifting, moving patients, yardwork, gardening, functional mobility, etc.   Stability/Clinical Decision Making Evolving/Moderate complexity    Rehab Potential Good    PT Frequency 2x / week    PT Duration 12 weeks    PT Treatment/Interventions ADLs/Self Care Home Management;Aquatic Therapy;Cryotherapy;Gait training;Stair training;Functional mobility training;Therapeutic activities;Therapeutic exercise;Balance training;Neuromuscular re-education;Manual techniques;Dry needling;Energy conservation;Patient/family education    PT Next Visit Plan core/functional/LE strengthening as tolerated in continuing PT but plan to discharge PT at this time if she calls back with information from MD clarifying this recommendation and patient's preference.    PT Home Exercise Plan Medbridge Access Code: [email or web address]    Consulted and Agree with Plan of Care Patient             Patient will benefit from  skilled therapeutic intervention in order to improve the following deficits and impairments:  Abnormal gait, Impaired sensation, Improper body mechanics, Pain, Postural dysfunction, Hypermobility, Decreased activity tolerance, Decreased endurance, Decreased range of motion, Decreased strength, Impaired perceived functional ability, Difficulty walking, Decreased balance  Visit Diagnosis: Chronic right-sided low back pain, unspecified whether sciatica present  Sacrococcygeal disorders, not elsewhere classified  Muscle weakness (generalized)  Difficulty in walking, not elsewhere classified     Problem List Patient Active Problem List   Diagnosis Date Noted   Acquired absence of breast 08/16/2021   Dysuria 08/05/2021  B12 deficiency 07/21/2021   Atherosclerosis of aorta ([place]) 07/20/2021   Hepatic steatosis 07/20/2021   Abdominal pain 07/18/2021   Breast cancer ([place]) 03/17/2021   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[place]) 03/14/2021   Genetic testing 11/22/2020   Family history of ovarian cancer    Candidal vulvovaginitis 11/01/2020   Low back pain 10/06/2020   Encounter for medical examination to establish care 09/26/2020   Malignant neoplasm of upper-outer quadrant of left breast in female, estrogen receptor positive ([place]) 09/26/2020   BACK STRAIN, LUMBAR 03/08/2010   INTERNAL HEMORRHOIDS 01/27/2010   ANAL FISSURE 01/27/2010   OSTEOARTHRITIS, HANDS, BILATERAL 01/04/2010   ARTHRALGIA 10/05/2009   INSOMNIA, CHRONIC 05/06/2009   ALLERGIC RHINITIS, SEASONAL 02/09/2009   TOBACCO ABUSE 12/29/2008   IBS 10/13/2008   Anxiety state 08/25/2008   DEPRESSION, RECURRENT 08/25/2008   Everlean Alstrom. Graylon Good, PT, DPT 11/01/21, 10:26 AM  Addendum to correct visit number Everlean Alstrom. Graylon Good, PT, DPT 11/14/21, 10:20 AM   [facility] [address] [place], [address] Phone: [phone]   Fax:  [phone]  Name: NANCIE BOCANEGRA MRN: [id number] Date of Birth: December 03, 1970

## 2021-11-02 ENCOUNTER — Other Ambulatory Visit: Payer: Self-pay

## 2021-11-02 ENCOUNTER — Ambulatory Visit
Admission: RE | Admit: 2021-11-02 | Discharge: 2021-11-02 | Disposition: A | Discharge status: Home / Self Care | Payer: No Typology Code available for payment source | Source: Ambulatory Visit | Attending: Oncology | Admitting: Oncology

## 2021-11-02 DIAGNOSIS — C50412 Malignant neoplasm of upper-outer quadrant of left female breast: Secondary | ICD-10-CM | POA: Diagnosis not present

## 2021-11-02 DIAGNOSIS — Z0189 Encounter for other specified special examinations: Secondary | ICD-10-CM | POA: Diagnosis not present

## 2021-11-02 DIAGNOSIS — Z170 Estrogen receptor positive status [ER+]: Secondary | ICD-10-CM | POA: Diagnosis not present

## 2021-11-02 DIAGNOSIS — Z01818 Encounter for other preprocedural examination: Secondary | ICD-10-CM | POA: Insufficient documentation

## 2021-11-02 LAB — ECHOCARDIOGRAM COMPLETE
AR max vel: 2.05 cm2
AV Area VTI: 2.14 cm2
AV Area mean vel: 2.07 cm2
AV Mean grad: 5 mmHg
AV Peak grad: 8.4 mmHg
Ao pk vel: 1.45 m/s
Area-P 1/2: 3.65 cm2
MV VTI: 3.31 cm2
S' Lateral: 2.74 cm

## 2021-11-02 NOTE — Progress Notes (Signed)
*  PRELIMINARY RESULTS* Echocardiogram 2D Echocardiogram has been performed.  Tricia Berry 11/02/2021, 10:51 AM

## 2021-11-02 NOTE — Addendum Note (Signed)
Addended by: Lurlean Nanny on: 11/02/2021 04:02 PM   Modules accepted: Orders

## 2021-11-03 ENCOUNTER — Other Ambulatory Visit: Payer: Self-pay

## 2021-11-03 ENCOUNTER — Encounter: Payer: Self-pay | Admitting: Gastroenterology

## 2021-11-03 ENCOUNTER — Ambulatory Visit: Payer: No Typology Code available for payment source | Attending: Surgery | Admitting: Physical Therapy

## 2021-11-03 ENCOUNTER — Encounter: Payer: Self-pay | Admitting: Physical Therapy

## 2021-11-03 DIAGNOSIS — Z170 Estrogen receptor positive status [ER+]: Secondary | ICD-10-CM | POA: Insufficient documentation

## 2021-11-03 DIAGNOSIS — M25612 Stiffness of left shoulder, not elsewhere classified: Secondary | ICD-10-CM | POA: Insufficient documentation

## 2021-11-03 DIAGNOSIS — Z483 Aftercare following surgery for neoplasm: Secondary | ICD-10-CM | POA: Insufficient documentation

## 2021-11-03 DIAGNOSIS — L599 Disorder of the skin and subcutaneous tissue related to radiation, unspecified: Secondary | ICD-10-CM | POA: Diagnosis present

## 2021-11-03 DIAGNOSIS — R293 Abnormal posture: Secondary | ICD-10-CM | POA: Insufficient documentation

## 2021-11-03 DIAGNOSIS — C50912 Malignant neoplasm of unspecified site of left female breast: Secondary | ICD-10-CM | POA: Insufficient documentation

## 2021-11-03 NOTE — Therapy (Signed)
[facility] @ [address] [place], [place], [address] Phone: [phone]   Fax:  [phone]  Physical Therapy Treatment  Patient Details  Name: Tricia Berry MRN: [id number] Date of Birth: May 26, 1971 Referring Provider (PT): Burnard Hawthorne, FNP   Encounter Date: 11/03/2021   PT End of Session - 11/03/21 0001     Visit Number 15    Number of Visits 20    Date for PT Re-Evaluation 12/01/21    PT Start Time 1308    PT Stop Time 1352    PT Time Calculation (min) 44 min    Activity Tolerance Patient tolerated treatment well    Behavior During Therapy [facility] for tasks assessed/performed             Past Medical History:  Diagnosis Date   Anxiety    Breast cancer ([place])    Diverticulitis    Family history of ovarian cancer    GERD (gastroesophageal reflux disease)    IBS (irritable bowel syndrome)    PONV (postoperative nausea and vomiting)    severe migraine and vomiting post anesthesia   Scoliosis     Past Surgical History:  Procedure Laterality Date   ABDOMINAL HYSTERECTOMY     still has ovaries, no gyn cancer, hysterectomy due to endometriosis. NO cervix on exam 01/10/21   BREAST BIOPSY Left 09/15/2020   [place] bx of mass, path pending, Q marker   BREAST BIOPSY Left 09/15/2020   [place] bx of LN, hydromarker, path pending   BREAST RECONSTRUCTION WITH PLACEMENT OF TISSUE EXPANDER AND FLEX HD (ACELLULAR HYDRATED DERMIS) Bilateral 03/17/2021   Procedure: IMMEDIATE BILATERAL BREAST RECONSTRUCTION WITH PLACEMENT OF TISSUE EXPANDER AND FLEX HD (ACELLULAR HYDRATED DERMIS);  Surgeon: Wallace Going, DO;  Location: [facility];  Service: Plastics;  Laterality: Bilateral;   IR IMAGING GUIDED PORT INSERTION  10/01/2020   MODIFIED MASTECTOMY Left 03/17/2021   Procedure: LEFT MODIFIED RADICAL MASTECTOMY;  Surgeon: Erroll Luna, MD;  Location: [facility];  Service: General;  Laterality:  Left;   PORTA CATH REMOVAL Right 03/17/2021   Procedure: PORTA CATH REMOVAL;  Surgeon: Erroll Luna, MD;  Location: [facility];  Service: General;  Laterality: Right;   REMOVAL OF BILATERAL TISSUE EXPANDERS WITH PLACEMENT OF BILATERAL BREAST IMPLANTS Bilateral 05/09/2021   Procedure: REMOVAL OF BILATERAL TISSUE EXPANDERS WITH PLACEMENT OF BILATERAL BREAST IMPLANTS;  Surgeon: Wallace Going, DO;  Location: [facility];  Service: Plastics;  Laterality: Bilateral;  90 min   TOTAL MASTECTOMY Right 03/17/2021   Procedure: TOTAL MASTECTOMY;  Surgeon: Erroll Luna, MD;  Location: [facility];  Service: General;  Laterality: Right;    There were no vitals filed for this visit.   Subjective Assessment - 11/03/21 1309     Subjective I had covid and my energy still is not back. I was not exercising while I was sick. My arm is not as tight as it was.    Pertinent History L breast cancer ER+/PR+ HER2-, stage III, underwent a bilateral mastectomy and ALND on L (7/16) on 03/17/21, reconstruction in summer 2022, neuropathy of hands, lower legs and feet, completed radiation, completed radiation Dec17, 2021 to February 18, 2021    Currently in Pain? No/denies    Pain Score 0-No pain  [facility] Adult PT Treatment/Exercise - 11/03/21 0001       Manual Therapy   Soft tissue mobilization to L pec with UE in various degrees of flexion and abduction    Myofascial Release MFR done to 3 cords which were palpable in L axilla ending down into breast and upwards in to upper arm                       PT Short Term Goals - 09/27/21 0957       PT SHORT TERM GOAL #1   Title Be independent with initial home exercise program for self-management of symptoms.    Baseline initial HEP to be provided at visit 2 as appropriate (08/25/2021); initial HEP provided at visit 2 (08/30/2021);    Time 2    Period Weeks     Status Achieved    Target Date 09/08/21               PT Long Term Goals - 11/03/21 0001       PT LONG TERM GOAL #1   Title Pt will return to baseline shoulder ROM measurements and not demonstrate any signs or symptoms of lymphedema.    Baseline 05/31/21- pt has 180 degrees of flex and abd bilaterally    Time 4    Period Weeks    Status Achieved      PT LONG TERM GOAL #2   Title Pt will demonstrate 165 degrees of bilateral flexion to allow pt to reach overhead.    Baseline R 143 L 101; 05/02/21- 172 on R, 175 on L    Time 4    Period Weeks    Status Achieved      PT LONG TERM GOAL #3   Title Pt will demonstrate 165 degrees of bilateral shoulder abduction to allow her to reach out the side    Baseline R 152 L 78; 05/02/21- R 180 L 179    Time 4    Period Weeks    Status Achieved      PT LONG TERM GOAL #4   Title Pt will report she is no longer having muscle spasms across her chest to allow improved comfort    Baseline 05/02/21- pt reports she is no longer having muscle spasms    Time 4    Period Weeks    Status Achieved      PT LONG TERM GOAL #5   Title Pt will be independent in a home exercise program for long term strengthening and stretching.    Time 4    Period Weeks    Status On-going      PT LONG TERM GOAL #6   Title Pt will report a 50% improvement in edema in L trunk and chest to allow improved comfort.    Baseline 05/02/21- 20% improved; 05/31/21 no edema present    Time 4    Period Weeks    Status Achieved      PT LONG TERM GOAL #7   Title Pt will be independent in Strength ABC program for long term stretching and strengthening after completion of radiation.    Time 4    Status On-going      PT LONG TERM GOAL #8   Title Pt will demonstrate baseline ROM (180 degrees) of flexion and abduction of LUE to allow pt to return to prior level of function.    Baseline 160 flex, 164 abd; 09/29/21- flex- 160, abd -  156 both with pain from cording in axilla    Time 4     Period Weeks                   Plan - 11/03/21 0001     Clinical Impression Statement Pt has been unable to exercise for approximately 3 weeks because she had covid and spent most of the time in bed. She is still very fatigued and has low energy post covid. Her L pec has tightened up and pt still has 3 cords in her axilla extending down upper arm and into breast. Focused today on myofascial release to cording to improve ROM. By end of session pt demonstrate improved mobility of pec and cords. Pt would benefit from continued skilled PT services to decrease L axillary cording, decrease pec tightness and instruct pt in Strength ABC program.    PT Frequency 2x / week    PT Duration 4 weeks    PT Treatment/Interventions ADLs/Self Care Home Management;Therapeutic exercise;Patient/family education;Scar mobilization;Passive range of motion;Manual techniques;Manual lymph drainage;Compression bandaging;Taping;Vasopneumatic Device    PT Next Visit Plan make sure pt scheduled 3 month SOZO in mid March, MFR to L axilla in area of cording, STM to L pec, pec stretching, eventually instruct in Strength ABC program; Cont every 3 month L-Dex screens for up to 2 years from her SLNB    PT Home Exercise Plan post op breast exercises, instructed pt in supine dowel, supine scap series    Consulted and Agree with Plan of Care Patient             Patient will benefit from skilled therapeutic intervention in order to improve the following deficits and impairments:  Pain, Postural dysfunction, Decreased knowledge of precautions, Impaired UE functional use, Increased fascial restricitons, Decreased strength, Decreased range of motion, Decreased scar mobility, Increased edema, Increased muscle spasms  Visit Diagnosis: Stiffness of left shoulder, not elsewhere classified  Disorder of the skin and subcutaneous tissue related to radiation, unspecified  Aftercare following surgery for neoplasm  Abnormal  posture  Malignant 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, estrogen receptor positive, unspecified site of breast [facility])     Problem List Patient Active Problem List   Diagnosis Date Noted   Acquired absence of breast 08/16/2021   Dysuria 08/05/2021   B12 deficiency 07/21/2021   Atherosclerosis of aorta ([place]) 07/20/2021   Hepatic steatosis 07/20/2021   Abdominal pain 07/18/2021   Breast cancer ([place]) 03/17/2021   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   Genetic testing 11/22/2020   Family history of ovarian cancer    Candidal vulvovaginitis 11/01/2020   Low back pain 10/06/2020   Encounter for medical examination to establish care 09/26/2020   Malignant neoplasm of upper-outer quadrant of left breast in female, estrogen receptor positive ([place]) 09/26/2020   BACK STRAIN, LUMBAR 03/08/2010   INTERNAL HEMORRHOIDS 01/27/2010   ANAL FISSURE 01/27/2010   OSTEOARTHRITIS, HANDS, BILATERAL 01/04/2010   ARTHRALGIA 10/05/2009   INSOMNIA, CHRONIC 05/06/2009   ALLERGIC RHINITIS, SEASONAL 02/09/2009   TOBACCO ABUSE 12/29/2008   IBS 10/13/2008   Anxiety state 08/25/2008   DEPRESSION, RECURRENT 08/25/2008    Allyson Sabal [facility] [place], PT 11/03/2021, 2:00 PM  [facility] @ [address] [place], [place], [address] Phone: [phone]   Fax:  [phone]  Name: CARLEI HUANG MRN: [id number] Date of Birth: [birth year]

## 2021-11-08 ENCOUNTER — Encounter: Payer: Self-pay | Admitting: Physical Therapy

## 2021-11-08 ENCOUNTER — Ambulatory Visit: Payer: No Typology Code available for payment source | Admitting: Physical Therapy

## 2021-11-08 DIAGNOSIS — M545 Low back pain, unspecified: Secondary | ICD-10-CM | POA: Diagnosis not present

## 2021-11-08 DIAGNOSIS — R262 Difficulty in walking, not elsewhere classified: Secondary | ICD-10-CM

## 2021-11-08 DIAGNOSIS — M533 Sacrococcygeal disorders, not elsewhere classified: Secondary | ICD-10-CM

## 2021-11-08 DIAGNOSIS — M6281 Muscle weakness (generalized): Secondary | ICD-10-CM

## 2021-11-08 NOTE — Therapy (Addendum)
[facility] [address] [place], [address] Phone: [phone]   Fax:  [phone]  Physical Therapy Treatment  Patient Details  Name: Tricia Berry MRN: [id number] Date of Birth: 1971/04/09 Referring Provider (PT): Burnard Hawthorne, FNP   Encounter Date: 11/08/2021   PT End of Session - 11/08/21 1047     Visit Number 8   Number of Visits 24    Date for PT Re-Evaluation 11/17/21    Authorization Type [employer] reporting period from 08/25/2021    Progress Note Due on Visit 10    PT Start Time 0949    PT Stop Time 1035    PT Time Calculation (min) 46 min    Activity Tolerance Patient tolerated treatment well;No increased pain;Patient limited by pain    Behavior During Therapy [facility] for tasks assessed/performed             Past Medical History:  Diagnosis Date   Anxiety    Breast cancer ([place])    Diverticulitis    Family history of ovarian cancer    GERD (gastroesophageal reflux disease)    IBS (irritable bowel syndrome)    PONV (postoperative nausea and vomiting)    severe migraine and vomiting post anesthesia   Scoliosis     Past Surgical History:  Procedure Laterality Date   ABDOMINAL HYSTERECTOMY     still has ovaries, no gyn cancer, hysterectomy due to endometriosis. NO cervix on exam 01/10/21   BREAST BIOPSY Left 09/15/2020   [place] bx of mass, path pending, Q marker   BREAST BIOPSY Left 09/15/2020   [place] bx of LN, hydromarker, path pending   BREAST RECONSTRUCTION WITH PLACEMENT OF TISSUE EXPANDER AND FLEX HD (ACELLULAR HYDRATED DERMIS) Bilateral 03/17/2021   Procedure: IMMEDIATE BILATERAL BREAST RECONSTRUCTION WITH PLACEMENT OF TISSUE EXPANDER AND FLEX HD (ACELLULAR HYDRATED DERMIS);  Surgeon: Wallace Going, DO;  Location: [place];  Service: Plastics;  Laterality: Bilateral;   IR IMAGING GUIDED PORT INSERTION  10/01/2020   MODIFIED MASTECTOMY Left 03/17/2021    Procedure: LEFT MODIFIED RADICAL MASTECTOMY;  Surgeon: Erroll Luna, MD;  Location: [place];  Service: General;  Laterality: Left;   PORTA CATH REMOVAL Right 03/17/2021   Procedure: PORTA CATH REMOVAL;  Surgeon: Erroll Luna, MD;  Location: [place];  Service: General;  Laterality: Right;   REMOVAL OF BILATERAL TISSUE EXPANDERS WITH PLACEMENT OF BILATERAL BREAST IMPLANTS Bilateral 05/09/2021   Procedure: REMOVAL OF BILATERAL TISSUE EXPANDERS WITH PLACEMENT OF BILATERAL BREAST IMPLANTS;  Surgeon: Wallace Going, DO;  Location: [place];  Service: Plastics;  Laterality: Bilateral;  90 min   TOTAL MASTECTOMY Right 03/17/2021   Procedure: TOTAL MASTECTOMY;  Surgeon: Erroll Luna, MD;  Location: [place];  Service: General;  Laterality: Right;    There were no vitals filed for this visit.   Subjective Assessment - 11/08/21 0950     Subjective Patient reports while she was getting ready for a shower on Sunday night she suddently had pain at the right lower back that brought her to her knees. Yesterday she felt a little better when she went to the physiatrist appointment yesterday she was feeling a bit better but by the end of the day she could not walk. She is walking this morning mildly stooped and offweighted R LE. She took a muscle relaxer and by this morning she was able to get out of  bed. This has not happened like this before. She has the main point of pain in the right lower back and radiates around the entire R hip and down the R leg but not below the knee. Denies numbness or increased weakness. She rates her pain 6/10 depending on how she moves and it is getting better since she took pain meds at 9. Patient states the physiatrist is planning to do injections and also said she does not need to continue PT if she is doing exercises she can do at home. Patient states physiatrist did not tell her to stop PT but said she  did not need to continue.    Pertinent History Patient is a 51 y.o. female who presents to outpatient physical therapy with a referral for medical diagnosis Right-sided low back pain without sciatica, unspecified chronicity. This patient's chief complaints consist of worsening chronic R sided low back pain that intermittently radiates down the leg and across the back to the left side leading to the following functional deficits: sudden grabbing stops her in her tracks and she must sit down, stretching, bending, work responsibilities, dressing, petting cats, caring for cats, lifting, moving patients, yardwork, gardening, functional mobility, etc.  Relevant past medical history and comorbidities include L breast cancer stage IIIb (dx Nov 2021. ER+/PR+ HER2-, underwent a bilateral mastectomy and ALND on L (7/16) on 03/17/21, reconstruction in summer 2022, neuropathy of hands, lower legs and feet, completed chemo, completed radiation Dec17, 2021 to February 18, 2021, currently taking oral medication and infusions), scoliosis, internal hemorrhoids, atherosclerosis of aorta, hepatic steatosis, chemotherapy -induced neuropathy, anxiety/depression, chronic insomnia, arthralgia, B12 deficiency, GERD, IBS, diverticulitis, current smoker (trying to quit), UTIs, abdominal hysterectomy.Patient denies hx of  stroke, seizures, lung problem, major cardiac events, diabetes, changes in bowel or bladder problems, new onset stumbling or dropping things (that appears related to cancer treatments), spinal surgeries, osteoporosis (but is taking medication that can weaken bones).    Limitations House hold activities;Lifting;Standing;Walking;Other (comment)   sudden grabbing stops her in her tracks and she must sit down, stretching, bending, work responsibilities, dressing, petting cats, caring for cats, lifting, moving patients, yardwork, gardening, functional mobility, etc.   Diagnostic tests R hip xray report 08/05/2021: negative. Lumbar  xray report 10/06/2021: "IMPRESSION:  Severe lumbar spine scoliosis concave right. Diffuse multilevel  degenerative change. No acute or focal bony abnormality identified." CT abdomen/pelvis report 07/18/2021: "Musculoskeletal: No acute or destructive bony lesions. Left convex  thoracolumbar scoliosis. Reconstructed images demonstrate no  additional findings. IMPRESSION:  1. Diverticulosis of the rectosigmoid colon, with no evidence of  acute diverticulitis. Mild wall thickening of the sigmoid colon  likely reflects scarring from previous bouts of diverticulitis.  2. Normal appendix.  3. Mild hepatic steatosis.  4.  Aortic Atherosclerosis (ICD10-I70.0)."    Currently in Pain? Yes    Pain Score 6               TREATMENT:   denies latex sensitivity   Therapeutic exercise: to centralize symptoms and improve ROM, strength, muscular endurance, and activity tolerance required for successful completion of functional activities.   Pre- intervention: pain 6/10 "depending on how I move" in right lumbar to hip region. 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 Stands with minimal weigh bearing through R LE and ambulates with antalgic gait favoring R LE.   Interventions:  - prone lying with 1 pillow under abdomen 1x3 min - prone lying with no pillows under abdomen, 1x3 min -  prone on elbows 1x3-5 min. Gentle STM manual therapy provided over right lumbar paraspinals during this time (not billed separately since primary treatment was the position).  - prone press up 1x10 pain gone in prone, very difficult for arms, improving ROM to fully extended elbows by last 3 reps.   - standing lumbar extension 1x7 (patient removed R arm from sacrum due to R UE discomfort). Back pain no worse.  - standing lumbar extension with sacrum braced on elevated plinth, 1x10 (good ability and no increase in pain).  - education in directional preference, theories behind why pain improved with  sustained and repeated lumbar extension, education about HEP and importance of avoiding flexion as much as possible in the short term while performing extension regularly (at least every 4 hours and protocol suggests every 2 hours).  - education and trial of sitting with lumbar roll and discussion of needing to place additional pillows at the back of chair because of short femurs.   Post-Intervention: patient report she "feels better" and rates pain in similar region 2/10. Able to stand completely straight and come up from sitting with less difficulty. Ambulates with even gait. Mild offloading of R LE in standing. Reports anxiety that she will hurt her back during current exacerbation and doing any lifting or heavy activities in her life in general.    Pt required multimodal cuing for proper technique and to facilitate improved neuromuscular control, strength, range of motion, and functional ability resulting in improved performance and form.   HOME EXERCISE PROGRAM Access Code: [email or web address] URL: [email or web address] Date: 11/08/2021 Prepared by: Rosita Kea  Exercises Bridge with Hip Abduction and Resistance - Ground Touches - 3 x weekly - 1 sets - 20 reps - 5 seconds hold Supine Transversus Abdominis Bracing with Leg Extension - 3 x weekly - 3 sets - 10 reps Single Leg Bridge - 3 x weekly - 3 sets - 10 reps - 1-5 seconds hold Reverse Curl - 3 x weekly - 1 sets - 10-20 reps - 5-10 seconds hold Squat with Chair Touch - 3 x weekly - 3 sets - 10 reps Diagonal Hip Extension with Resistance - 3 x weekly - 3 sets - 10 reps - 1 second hold Runner's Step Up/Down - 3 x weekly - 3 sets - 10 reps Lying Prone with 1 Pillow - 2 x daily - minutes hold Lying Prone - 2 x daily - 5 minutes hold Static Prone on Elbows - 2 x daily - 5 minutes hold Prone Press Up - 4 x daily - 10-15 reps - 1 second hold Standing Lumbar Extension - 4 x daily - 1 sets - 10-15 reps - 1 second hold Standing Lumbar  Extension with Counter - 4 x daily - 1 sets - 10-15 reps - 1 second hold Seated Correct Posture   PT Education - 11/08/21 1048     Education Details Exercise purpose/form. Self management techniques. HEP. Directional preference. Healing. flair up management.    Person(s) Educated Patient    Methods Explanation;Demonstration;Tactile cues;Verbal cues;Handout    Comprehension Verbalized understanding;Returned demonstration;Verbal cues required;Tactile cues required;Need further instruction              PT Short Term Goals - 09/27/21 0957       PT SHORT TERM GOAL #1   Title Be independent with initial home exercise program for self-management of symptoms.    Baseline initial HEP to be provided at visit 2 as appropriate (08/25/2021); initial HEP provided at visit 2 (  08/30/2021);    Time 2    Period Weeks    Status Achieved    Target Date 09/08/21               PT Long Term Goals - 11/08/21 1049       PT LONG TERM GOAL #1   Title Be independent with a long-term home exercise program for self-management of symptoms.    Baseline initial HEP to be provided at visit 2 as appropriate (08/25/2021); initial HEP provided on visit 2 (08/30/2021); HEP updated to be more comprehensive for long term use with tentative discharge today (10/04/2021);    Time 12    Period Weeks    Status Partially Met   TARGET DATE FOR ALL LONG TERM GOALS: 11/17/2021     PT LONG TERM GOAL #2   Title Demonstrate improved FOTO to equal or greater than 71 by visit #10 to demonstrate improvement in overall condition and self-reported functional ability.    Baseline 54 (08/25/2021); 56 at visit #6 (10/04/2021);    Time 12    Period Weeks    Status Partially Met      PT LONG TERM GOAL #3   Title Have full lumbar AROM with no compensations outside of those expected from scoliosis or increase in pain in all planes except intermittent end range discomfort to improve ability  to complete valued activities such bending,  carrying, rolling, dressing, etc.    Baseline painful and limited - see objective (08/25/2021);    Time 12    Period Weeks    Status On-going      PT LONG TERM GOAL #4   Title Improve B hip strength to equal or greater than 4+/5 with no increase in pain to improve patients ability to complete functional tasks such as working, dressing, walking, yardwork with less difficulty.    Baseline painful and weak - see objective (08/25/2021);    Time 12    Period Weeks    Status On-going      PT LONG TERM GOAL #5   Title Reduce pain with functional activities to equal or less than 1/10 to allow patient to complete usual activities including housework, walking, bending, sleeping with less difficulty.    Baseline up to 10/10 (08/25/2021);    Time 12    Period Weeks    Status On-going      PT LONG TERM GOAL #6   Title Complete community, work and/or recreational activities without limitation due to current condition.    Baseline sudden grabbing stops her in her tracks and she must sit down, stretching, bending, work responsibilities, dressing, petting cats, caring for cats, lifting, moving patients, yardwork, gardening, functional mobility, etc (08/25/2021);    Time 12    Period Weeks    Status On-going                   Plan - 11/08/21 1100     Clinical Impression Statement Patient arrives today with acute flair of low back pain with apparent flexion deformity. Based on description of symptoms and behavior during session appears to be non-neural possibly disc related pain that responds well to lumbar extension. Patient was able to decrease symptoms and physical deformity dramatically during session but did not completely abolish symptoms. Provided patient with education and HEP with specific exercises to help her manager her exacerbation more effectively at home to be able to return to more core and functional strengthening exercises. Patient has financial limitations and  is concerned about  her ability to continue with PT specifically for her back. Encouraged her to discuss with lymphedema/cancer PT if they can help with this aspect of her care under their treatment umbrella to help with affordability. Patient would benefit from continued management of limiting condition by skilled physical therapist to address remaining impairments and functional limitations to work towards stated goals and return to PLOF or maximal functional independence.    Personal Factors and Comorbidities Comorbidity 3+;Past/Current Experience;Profession;Fitness;Social Background;Time since onset of injury/illness/exacerbation    Comorbidities Relevant past medical history and comorbidities include L breast cancer stage IIIb (dx Nov 2021. ER+/PR+ HER2-, underwent a bilateral mastectomy and ALND on L (7/16) on 03/17/21, reconstruction in summer 2022, neuropathy of hands, lower legs and feet, completed chemo, completed radiation Dec17, 2021 to February 18, 2021, currently taking oral medication and infusions), scoliosis, internal hemorrhoids, atherosclerosis of aorta, hepatic steatosis, chemotherapy -induced neuropathy, anxiety/depression, chronic insomnia, arthralgia, B12 deficiency, GERD, IBS, diverticulitis, current smoker (trying to quit), UTIs, abdominal hysterectomy    Examination-Activity Limitations Bed Mobility;Lift;Squat;Bend;Locomotion Level;Stand;Caring for Others;Carry;Transfers;Sleep;Dressing    Examination-Participation Restrictions Laundry;Shop;Cleaning;Community Activity;Meal Prep;Occupation;Yard Work;Interpersonal Relationship;Other   sudden grabbing stops her in her tracks and she must sit down, stretching, bending, work responsibilities, dressing, petting cats, caring for cats, lifting, moving patients, yardwork, gardening, functional mobility, etc.   Stability/Clinical Decision Making Evolving/Moderate complexity    Rehab Potential Good    PT Frequency 2x / week    PT Duration 12 weeks    PT  Treatment/Interventions ADLs/Self Care Home Management;Aquatic Therapy;Cryotherapy;Gait training;Stair training;Functional mobility training;Therapeutic activities;Therapeutic exercise;Balance training;Neuromuscular re-education;Manual techniques;Dry needling;Energy conservation;Patient/family education    PT Next Visit Plan core/functional/LE strengthening as tolerated in continuing PT but plan to discharge PT at this time if she calls back with information from MD clarifying this recommendation and patient's preference.    PT Home Exercise Plan Medbridge Access Code: [email or web address]    Consulted and Agree with Plan of Care Patient             Patient will benefit from skilled therapeutic intervention in order to improve the following deficits and impairments:  Abnormal gait, Impaired sensation, Improper body mechanics, Pain, Postural dysfunction, Hypermobility, Decreased activity tolerance, Decreased endurance, Decreased range of motion, Decreased strength, Impaired perceived functional ability, Difficulty walking, Decreased balance  Visit Diagnosis: Chronic right-sided low back pain, unspecified whether sciatica present  Sacrococcygeal disorders, not elsewhere classified  Muscle weakness (generalized)  Difficulty in walking, not elsewhere classified     Problem List Patient Active Problem List   Diagnosis Date Noted   Acquired absence of breast 08/16/2021   Dysuria 08/05/2021   B12 deficiency 07/21/2021   Atherosclerosis of aorta ([place]) 07/20/2021   Hepatic steatosis 07/20/2021   Abdominal pain 07/18/2021   Breast cancer ([place]) 03/17/2021   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[place]) 03/14/2021   Genetic testing 11/22/2020   Family history of ovarian cancer    Candidal vulvovaginitis 11/01/2020   Low back pain 10/06/2020   Encounter for medical examination to establish care 09/26/2020   Malignant neoplasm of upper-outer quadrant of left breast in female, estrogen receptor positive ([place])  09/26/2020   BACK STRAIN, LUMBAR 03/08/2010   INTERNAL HEMORRHOIDS 01/27/2010   ANAL FISSURE 01/27/2010   OSTEOARTHRITIS, HANDS, BILATERAL 01/04/2010   ARTHRALGIA 10/05/2009   INSOMNIA, CHRONIC 05/06/2009   ALLERGIC RHINITIS, SEASONAL 02/09/2009   TOBACCO ABUSE 12/29/2008   IBS 10/13/2008   Anxiety state 08/25/2008   DEPRESSION, RECURRENT 08/25/2008    Everlean Alstrom. Graylon Good, PT, DPT 11/08/21,  11:02 AM  Addendum to correct visit number Everlean Alstrom. Graylon Good, PT, DPT 11/14/21, 10:21 AM   [facility] [address], [place], [address] Phone: [phone]   Fax:  [phone]  Name: ALEXIANA LAVERDURE MRN: [id number] Date of Birth: 1970/12/10

## 2021-11-09 ENCOUNTER — Other Ambulatory Visit: Payer: Self-pay

## 2021-11-09 ENCOUNTER — Inpatient Hospital Stay: Payer: No Typology Code available for payment source

## 2021-11-09 ENCOUNTER — Inpatient Hospital Stay: Payer: No Typology Code available for payment source | Attending: Oncology

## 2021-11-09 ENCOUNTER — Encounter: Payer: Self-pay | Admitting: Oncology

## 2021-11-09 ENCOUNTER — Inpatient Hospital Stay (HOSPITAL_BASED_OUTPATIENT_CLINIC_OR_DEPARTMENT_OTHER): Payer: No Typology Code available for payment source | Admitting: Oncology

## 2021-11-09 ENCOUNTER — Encounter: Payer: No Typology Code available for payment source | Admitting: Physical Therapy

## 2021-11-09 VITALS — BP 121/84 | HR 84 | Temp 96.2°F | Resp 16 | Ht 60.0 in | Wt 114.3 lb

## 2021-11-09 VITALS — BP 112/77 | HR 65 | Resp 16

## 2021-11-09 DIAGNOSIS — C50412 Malignant neoplasm of upper-outer quadrant of left female breast: Secondary | ICD-10-CM

## 2021-11-09 DIAGNOSIS — Z79899 Other long term (current) drug therapy: Secondary | ICD-10-CM | POA: Insufficient documentation

## 2021-11-09 DIAGNOSIS — Z5112 Encounter for antineoplastic immunotherapy: Secondary | ICD-10-CM | POA: Diagnosis present

## 2021-11-09 DIAGNOSIS — Z5111 Encounter for antineoplastic chemotherapy: Secondary | ICD-10-CM | POA: Diagnosis present

## 2021-11-09 DIAGNOSIS — G620 Drug-induced polyneuropathy: Secondary | ICD-10-CM | POA: Diagnosis not present

## 2021-11-09 DIAGNOSIS — Z170 Estrogen receptor positive status [ER+]: Secondary | ICD-10-CM | POA: Diagnosis not present

## 2021-11-09 DIAGNOSIS — T451X5A Adverse effect of antineoplastic and immunosuppressive drugs, initial encounter: Secondary | ICD-10-CM

## 2021-11-09 LAB — CBC WITH DIFFERENTIAL/PLATELET
Abs Immature Granulocytes: 0.01 10*3/uL (ref 0.00–0.07)
Basophils Absolute: 0 10*3/uL (ref 0.0–0.1)
Basophils Relative: 1 %
Eosinophils Absolute: 0.1 10*3/uL (ref 0.0–0.5)
Eosinophils Relative: 2 %
HCT: 42.1 % (ref 36.0–46.0)
Hemoglobin: 13.7 g/dL (ref 12.0–15.0)
Immature Granulocytes: 0 %
Lymphocytes Relative: 31 %
Lymphs Abs: 2.2 10*3/uL (ref 0.7–4.0)
MCH: 29 pg (ref 26.0–34.0)
MCHC: 32.5 g/dL (ref 30.0–36.0)
MCV: 89 fL (ref 80.0–100.0)
Monocytes Absolute: 0.6 10*3/uL (ref 0.1–1.0)
Monocytes Relative: 8 %
Neutro Abs: 4.1 10*3/uL (ref 1.7–7.7)
Neutrophils Relative %: 58 %
Platelets: 174 10*3/uL (ref 150–400)
RBC: 4.73 MIL/uL (ref 3.87–5.11)
RDW: 13.4 % (ref 11.5–15.5)
WBC: 7 10*3/uL (ref 4.0–10.5)
nRBC: 0 % (ref 0.0–0.2)

## 2021-11-09 LAB — COMPREHENSIVE METABOLIC PANEL
ALT: 38 U/L (ref 0–44)
AST: 43 U/L — ABNORMAL HIGH (ref 15–41)
Albumin: 3.9 g/dL (ref 3.5–5.0)
Alkaline Phosphatase: 72 U/L (ref 38–126)
Anion gap: 10 (ref 5–15)
BUN: 11 mg/dL (ref 6–20)
CO2: 25 mmol/L (ref 22–32)
Calcium: 9 mg/dL (ref 8.9–10.3)
Chloride: 103 mmol/L (ref 98–111)
Creatinine, Ser: 0.86 mg/dL (ref 0.44–1.00)
GFR, Estimated: >60 mL/min (ref >60)
Glucose, Bld: 139 mg/dL — ABNORMAL HIGH (ref 70–99)
Potassium: 3.6 mmol/L (ref 3.5–5.1)
Sodium: 138 mmol/L (ref 135–145)
Total Bilirubin: 0.7 mg/dL (ref 0.3–1.2)
Total Protein: 6.8 g/dL (ref 6.5–8.1)

## 2021-11-09 MED ORDER — PERTUZ-TRASTUZ-HYALURON-ZZXF 60-60-2000 MG-MG-U/ML CHEMO ~~LOC~~ SOLN
10.0000 mL | Freq: Once | SUBCUTANEOUS | Status: AC
  Administered 2021-11-09: 10 mL via SUBCUTANEOUS
  Filled 2021-11-09: qty 10

## 2021-11-09 MED ORDER — ACETAMINOPHEN 325 MG PO TABS
650.0000 mg | ORAL_TABLET | Freq: Once | ORAL | Status: AC
  Administered 2021-11-09: 650 mg via ORAL
  Filled 2021-11-09: qty 2

## 2021-11-09 MED ORDER — DIPHENHYDRAMINE HCL 25 MG PO CAPS
50.0000 mg | ORAL_CAPSULE | Freq: Once | ORAL | Status: AC
  Administered 2021-11-09: 50 mg via ORAL
  Filled 2021-11-09: qty 2

## 2021-11-09 MED ORDER — GOSERELIN ACETATE 3.6 MG ~~LOC~~ IMPL
3.6000 mg | DRUG_IMPLANT | Freq: Once | SUBCUTANEOUS | Status: AC
  Administered 2021-11-09: 3.6 mg via SUBCUTANEOUS
  Filled 2021-11-09: qty 3.6

## 2021-11-09 NOTE — Progress Notes (Signed)
Hematology/Oncology Consult note [facility]  Telephone:([phone] [phone]  Patient Care Team: Burnard Hawthorne, FNP as PCP - General (Family Medicine) Rico Junker, RN as Oncology Nurse Navigator Sindy Guadeloupe, MD as Consulting Physician (Hematology and Oncology)   Name of the patient: Tricia Berry  [id number]  1971-03-10   Date of visit: 11/09/21  Diagnosis- invasive mammary carcinoma of the left breast stage III ypT2 ypN2 cM0 ER/PR positive and HER2 positive by [facility]  Chief complaint/ Reason for visit-treatment assessment prior to cycle 9 of adjuvant Herceptin and Perjeta  Heme/Onc history: 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  She then noticed that her lump is gradually getting larger with an area of ulceration on the skin and underwent a diagnostic bilateral mammogram on 09/08/2020 which showed hypoechoic interconnected masses in the left breast from 10:00 to 2 o'clock position spanning at least an area of 4.8 cm.  Ultrasound also showed 5 abnormal lymph nodes in the axilla with cortical thickening.  Both the mass and the lymph nodes were biopsied and was consistent with invasive mammary carcinoma grade 2.  Tumor was ER +91- 100%, PR +11 to 20% and HER-2 negative.  Ki-67 15%. Patient will be meeting [name] from [facility] surgery to discuss surgical management.  She is here for medical oncology recommendations.  In terms of her general health patient is otherwise doing well and does not have significant comorbidities.  She does endorse significant pain in the area of her left breast.  Tylenol and Motrin has not been helping her with this pain.  She lives with her husband and 2 children who have special needs.  No prior history of abnormal breast  mammograms or breast biopsies.   PET CT scan showed hypermetabolism in the area of the left breast but no evidence of hypermetabolism in the left axilla Or evidence of distant metastatic disease.   Neoadjuvant dose dense ACT chemotherapy started on 10/08/2020. Interim ultrasound after 4 cycles of dose dense AC chemotherapy showed overall decrease in volume of the tumor.  Nodularity previously seen on ultrasound was also decreased in size.   Patient completed neoadjuvant AC Taxol chemotherapy and underwent bilateral mastectomy with reconstruction.Final pathology showed fibrocystic changes in the right breast with no malignancy.  3.6 cm invasive mammary carcinoma in the left breast.  7 out of 16 lymph nodes positive for malignancy.  Treatment effect in the breast present but not robust.  Treatment effect in the lymph nodes minimal.  Margins negative.  Overall grade 2.  Extranodal extension present.  ER greater than 90% positive, PR 15% positive.  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  She is using Lyrica for the same.  Also uses occasional oxycodone for that.  Has been having ongoing back pain for which she sees Ortho and is supposed to be getting steroid injections.  She is also using oxycodone for that.  Fatigue still persist but gradually improving.  ECOG PS- 1 Pain scale- 3 Opioid associated constipation- no  Review of systems- Review of Systems  Constitutional:  Positive for malaise/fatigue. Negative for chills, fever and weight loss.  HENT:  Negative for congestion, ear discharge and nosebleeds.   Eyes:  Negative for blurred vision.  Respiratory:  Negative for cough, hemoptysis, sputum production, shortness of breath and wheezing.   Cardiovascular:  Negative for chest pain, palpitations, orthopnea and claudication.  Gastrointestinal:  Negative for abdominal pain, blood in stool, constipation, diarrhea, heartburn, melena, nausea  and vomiting.  Genitourinary:  Negative for dysuria, flank pain, frequency, hematuria and urgency.  Musculoskeletal:  Positive for back pain. Negative for joint pain and myalgias.  Skin:  Negative for rash.  Neurological:  Positive for sensory change (Peripheral neuropathy). Negative for dizziness, tingling, focal weakness, seizures, weakness and headaches.  Endo/Heme/Allergies:  Does not bruise/bleed easily.  Psychiatric/Behavioral:  Negative for depression and suicidal ideas. The patient does not have insomnia.     Allergies  Allergen Reactions   Morphine Nausea And Vomiting and Other (See Comments)    migranes Other reaction(s): Headache Migraine, vomiting   Sertraline Hcl     REACTION: Worsened symptoms of IBS   Sulfa Antibiotics Rash   Sulfamethoxazole Rash   Sulfonamide Derivatives Rash     Past Medical History:  Diagnosis Date   Anxiety    Breast cancer ([place])    Diverticulitis    Family history of ovarian cancer    GERD (gastroesophageal reflux disease)    IBS (irritable bowel syndrome)    PONV (postoperative nausea and vomiting)    severe migraine and vomiting post anesthesia   Scoliosis      Past Surgical History:  Procedure Laterality Date   ABDOMINAL HYSTERECTOMY     still has ovaries, no gyn cancer, hysterectomy due to endometriosis. NO cervix on exam 01/10/21   BREAST BIOPSY Left 09/15/2020   [place] bx of mass, path pending, Q marker   BREAST BIOPSY Left 09/15/2020   [place] bx of LN, hydromarker, path pending   BREAST RECONSTRUCTION WITH PLACEMENT OF TISSUE EXPANDER AND FLEX HD (ACELLULAR HYDRATED DERMIS) Bilateral 03/17/2021   Procedure: IMMEDIATE BILATERAL BREAST RECONSTRUCTION WITH PLACEMENT OF TISSUE EXPANDER AND FLEX HD (ACELLULAR HYDRATED DERMIS);  Surgeon: Wallace Going, DO;  Location: [place];  Service: Plastics;  Laterality: Bilateral;   IR IMAGING GUIDED PORT INSERTION  10/01/2020   MODIFIED MASTECTOMY Left 03/17/2021   Procedure:  LEFT MODIFIED RADICAL MASTECTOMY;  Surgeon: Erroll Luna, MD;  Location: [place];  Service: General;  Laterality: Left;   PORTA CATH REMOVAL Right 03/17/2021   Procedure: PORTA CATH REMOVAL;  Surgeon: Erroll Luna, MD;  Location: [place];  Service: General;  Laterality: Right;   REMOVAL OF BILATERAL TISSUE EXPANDERS WITH PLACEMENT OF BILATERAL BREAST IMPLANTS Bilateral 05/09/2021   Procedure: REMOVAL OF BILATERAL TISSUE EXPANDERS WITH PLACEMENT OF BILATERAL BREAST IMPLANTS;  Surgeon: Wallace Going, DO;  Location: [place];  Service: Plastics;  Laterality: Bilateral;  90 min   TOTAL MASTECTOMY Right 03/17/2021   Procedure: TOTAL MASTECTOMY;  Surgeon: Erroll Luna, MD;  Location: [place];  Service: General;  Laterality: Right;    Social History   Socioeconomic History   Marital status: Divorced    Spouse name: Not on file   Number of children: 2   Years of education: Not on file   Highest education level: Not on file  Occupational History   Occupation: Nurse    Employer: [employer]  Tobacco Use   Smoking status: Every Day    Packs/day: 0.25    Types: Cigarettes   Smokeless tobacco: Never   Tobacco comments:    Pt trying to quit now. Has  reduced amount significantly  Vaping Use   Vaping Use: Never used  Substance and Sexual Activity   Alcohol use: Not Currently   Drug use: No   Sexual activity: Not Currently    Birth control/protection: None  Other Topics Concern   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 She and her spouse are primary caregivers.    Social Determinants of Health   Financial Resource Strain: Not on file  Food Insecurity: Not on file  Transportation Needs: Not on file  Physical Activity: Not on file  Stress: Not on file  Social Connections: Not on file  Intimate Partner Violence: Not on  file    Family History  Problem Relation Age of Onset   Hypertension Mother    Osteoarthritis Mother    Diverticulitis Mother    Heart failure Father    Hypertension Father    Gout Father    Diverticulitis Brother    Ovarian cancer Paternal Grandmother      Current Outpatient Medications:    anastrozole (ARIMIDEX) 1 MG tablet, Take 1 tablet (1 mg total) by mouth daily., Disp: 30 tablet, Rfl: 3   cyanocobalamin (,VITAMIN B-12,) 1000 MCG/ML injection, 1000 mcg (1 mL) intramuscular injection in the thigh ( vastus lateralis) once per month., Disp: 3 mL, Rfl: 4   HYDROcodone bit-homatropine (HYCODAN) 5-1.5 MG/5ML syrup, Take 5 mLs by mouth every 6 (six) hours as needed for cough., Disp: 120 mL, Rfl: 0   Lactobacillus (PROBIOTIC ACIDOPHILUS PO), Take 1 capsule by mouth daily., Disp: , Rfl:    lidocaine-prilocaine (EMLA) cream, Apply 1 application topically as needed., Disp: 30 g, Rfl: 0   LORazepam (ATIVAN) 0.5 MG tablet, Take 1 tablet (0.5 mg total) by mouth at bedtime as needed for up to 30 doses for anxiety., Disp: 30 tablet, Rfl: 0   Multiple Vitamins-Minerals (MULTIVITAL PO), Take 1 Dose by mouth daily., Disp: , Rfl:    nicotine (NICODERM CQ - DOSED IN MG/24 HOURS) 21 mg/24hr patch, use 1 patch daily, Disp: 28 patch, Rfl: 0   nicotine (NICOTROL) 10 MG inhaler, FOLLOW INSTRUCTIONS ON PACKAGE AS NEEDED FOR SMOKING CESSATION, Disp: 168 each, Rfl: 0   nicotine polacrilex (SM NICOTINE) 2 MG lozenge, Take by mouth., Disp: 72 lozenge, Rfl: 0   ondansetron (ZOFRAN) 8 MG tablet, Take 1 tablet (8 mg total) by mouth every 8 (eight) hours as needed for nausea or vomiting., Disp: 60 tablet, Rfl: 0   Oxycodone HCl 10 MG TABS, Take 1 tablet (10 mg total) by mouth every 6 (six) hours as needed., Disp: 120 tablet, Rfl: 0   pregabalin (LYRICA) 75 MG capsule, Take 1 capsule (75 mg total) by mouth 2 (two) times daily., Disp: 60 capsule, Rfl: 2   rosuvastatin (CRESTOR) 5 MG tablet, Take 1 tablet (5 mg total)  by mouth every evening., Disp: 90 tablet, Rfl: 3   tiZANidine (ZANAFLEX) 4 MG tablet, Take 4 mg by mouth 3 (three) times daily., Disp: , Rfl:    traZODone (DESYREL) 50 MG tablet, Take 0.5-1 tablets (25-50 mg total) by mouth at bedtime as needed for sleep., Disp: 30 tablet, Rfl: 3   trimethoprim (TRIMPEX) 100 MG tablet, Take 1 tablet (100 mg total) by mouth daily., Disp: 30 tablet, Rfl: 5 No current facility-administered medications for this visit.  Facility-Administered Medications Ordered in Other Visits:    goserelin (ZOLADEX) injection 3.6 mg,  3.6 mg, Subcutaneous, Q28 days, Sindy Guadeloupe, MD, 3.6 mg at 05/04/21 1150   heparin lock flush 100 unit/mL, 500 Units, Intravenous, Once, Sindy Guadeloupe, MD  Physical exam:  Vitals:   11/09/21 1014  BP: 121/84  Pulse: 84  Resp: 16  Temp: (!) 96.2 F (35.7 C)  TempSrc: Tympanic  SpO2: 100%  Weight: 114 lb 4.8 oz (51.8 kg)  Height: 5' (1.524 m)   Physical Exam Constitutional:      General: She is not in acute distress. Cardiovascular:     Rate and Rhythm: Normal rate and regular rhythm.     Heart sounds: Normal heart sounds.  Pulmonary:     Effort: Pulmonary effort is normal.     Breath sounds: Normal breath sounds.  Skin:    General: Skin is warm and dry.  Neurological:     Mental Status: She is alert and oriented to person, place, and time.    Chest wall exam: Patient is s/p bilateral mastectomy with reconstruction.  No evidence of chest wall recurrence.  No evidence of bilateral palpable adenopathy.   CMP Latest Ref Rng & Units 11/09/2021  Glucose 70 - 99 mg/dL 139(H)  BUN 6 - 20 mg/dL 11  Creatinine 0.44 - 1.00 mg/dL 0.86  Sodium 135 - 145 mmol/L 138  Potassium 3.5 - 5.1 mmol/L 3.6  Chloride 98 - 111 mmol/L 103  CO2 22 - 32 mmol/L 25  Calcium 8.9 - 10.3 mg/dL 9.0  Total Protein 6.5 - 8.1 g/dL 6.8  Total Bilirubin 0.3 - 1.2 mg/dL 0.7  Alkaline Phos 38 - 126 U/L 72  AST 15 - 41 U/L 43(H)  ALT 0 - 44 U/L 38   CBC Latest  Ref Rng & Units 11/09/2021  WBC 4.0 - 10.5 K/uL 7.0  Hemoglobin 12.0 - 15.0 g/dL 13.7  Hematocrit 36.0 - 46.0 % 42.1  Platelets 150 - 400 K/uL 174    No images are attached to the encounter.  MR LUMBAR SPINE WO CONTRAST  Result Date: 10/13/2021 CLINICAL DATA:  Low back pain and right greater than left buttock pain, worsening over the last few months EXAM: MRI LUMBAR SPINE WITHOUT CONTRAST TECHNIQUE: Multiplanar, multisequence MR imaging of the lumbar spine was performed. No intravenous contrast was administered. COMPARISON:  CT scan 07/18/2021 FINDINGS: Segmentation: The lowest lumbar type non-rib-bearing vertebra is labeled as L5. Alignment: Levoconvex lumbar scoliosis with substantial rotary component. No subluxation. Vertebrae: Mild type 1 degenerative endplate findings along the inferior endplate of L2 eccentric to the right. Disc desiccation at L3-4 and L5-S1 with mild loss of disc height at L5-S1. Mild degenerative facet edema on the right at L3-4 Conus medullaris and cauda equina: Conus extends to the upper L2 level. Conus and cauda equina appear normal. Paraspinal and other soft tissues: Unremarkable Disc levels: L1-2: Unremarkable. L2-3: Unremarkable. L3-4: Mild right and borderline left foraminal stenosis due to disc bulge and bilateral degenerative facet arthropathy. Small right facet joint effusion. L4-5: Borderline left foraminal stenosis due to degenerative facet arthropathy and mild disc bulge. Borderline left subarticular lateral recess stenosis. L5-S1: Borderline left foraminal stenosis and borderline bilateral subarticular lateral recess stenosis due to disc bulge, facet arthropathy, and a small right lateral recess disc protrusion. IMPRESSION: 1. Lumbar spondylosis, scoliosis, and degenerative disc disease, resulting in mild impingement at L3-4 and borderline impingement at L4-5 and L5-S1. 2. Substantial levoconvex lumbar scoliosis with rotary component. 3. Small right facet joint  effusion the L3-4 level. Electronically Signed   By:  Van Clines M.D.   On: 10/13/2021 15:53   ECHOCARDIOGRAM COMPLETE  Result Date: 11/02/2021    ECHOCARDIOGRAM REPORT   Patient Name:   Tricia Berry Date of Exam: 11/02/2021 Medical Rec #:  [id number]           Height:       60.0 in Accession #:    [id number]          Weight:       110.0 lb Date of Birth:  November 18, 1970           BSA:          1.448 m Patient Age:    51 years            BP:           124/83 mmHg Patient Gender: F                   HR:           79 bpm. Exam Location:  [facility] Procedure: 2D Echo, Color Doppler, Cardiac Doppler and Strain Analysis Indications:     Z09 Chemo  History:         Patient has prior history of Echocardiogram examinations, most                  recent 08/04/2021. Malignant neoplasm of upper-outer quadrant                  of left breast in female, estrogen receptor positive.  Sonographer:     Charmayne Sheer Referring Phys:  [id number] Weston Anna Chisum Habenicht Diagnosing Phys: Kate Sable MD  Sonographer Comments: Image acquisition challenging due to breast implants. Global longitudinal strain was attempted. IMPRESSIONS  1. Left ventricular ejection fraction, by estimation, is 55 to 60%. The left ventricle has normal function. The left ventricle has no regional wall motion abnormalities. Left ventricular diastolic parameters were normal. The average left ventricular global longitudinal strain is -23.6 %. The global longitudinal strain is normal.  2. Right ventricular systolic function is normal. The right ventricular size is normal.  3. The mitral valve is normal in structure. No evidence of mitral valve regurgitation.  4. The aortic valve is tricuspid. Aortic valve regurgitation is not visualized.  5. The inferior vena cava is normal in size with greater than 50% respiratory variability, suggesting right atrial pressure of 3 mmHg. FINDINGS  Left Ventricle: Left ventricular ejection fraction, by estimation, is 55 to 60%. The  left ventricle has normal function. The left ventricle has no regional wall motion abnormalities. The average left ventricular global longitudinal strain is -23.6 %. The global longitudinal strain is normal. The left ventricular internal cavity size was normal in size. There is no left ventricular hypertrophy. Left ventricular diastolic parameters were normal. Right Ventricle: The right ventricular size is normal. No increase in right ventricular wall thickness. Right ventricular systolic function is normal. Left Atrium: Left atrial size was normal in size. Right Atrium: Right atrial size was normal in size. Pericardium: There is no evidence of pericardial effusion. Mitral Valve: The mitral valve is normal in structure. No evidence of mitral valve regurgitation. MV peak gradient, 3.2 mmHg. The mean mitral valve gradient is 1.0 mmHg. Tricuspid Valve: The tricuspid valve is not well visualized. Tricuspid valve regurgitation is not demonstrated. Aortic Valve: The aortic valve is tricuspid. Aortic valve regurgitation is not visualized. Aortic valve mean gradient measures 5.0 mmHg. Aortic valve peak gradient measures 8.4 mmHg. Aortic valve  area, by VTI measures 2.14 cm. Pulmonic Valve: The pulmonic valve was not well visualized. Pulmonic valve regurgitation is not visualized. Aorta: The aortic root and ascending aorta are structurally normal, with no evidence of dilitation. Venous: The inferior vena cava is normal in size with greater than 50% respiratory variability, suggesting right atrial pressure of 3 mmHg. IAS/Shunts: No atrial level shunt detected by color flow Doppler.  LEFT VENTRICLE PLAX 2D LVIDd:         3.80 cm   Diastology LVIDs:         2.74 cm   LV e' medial:    8.16 cm/s LV PW:         1.03 cm   LV E/e' medial:  7.6 LV IVS:        0.79 cm   LV e' lateral:   13.80 cm/s LVOT diam:     1.90 cm   LV E/e' lateral: 4.5 LV SV:         60 LV SV Index:   41        2D Longitudinal Strain LVOT Area:     2.84 cm  2D  Strain GLS Avg:     -23.6 %  LEFT ATRIUM             Index LA diam:        3.00 cm 2.07 cm/m LA Vol (A2C):   28.2 ml 19.48 ml/m LA Vol (A4C):   22.7 ml 15.68 ml/m LA Biplane Vol: 25.3 ml 17.47 ml/m  AORTIC VALVE                     PULMONIC VALVE AV Area (Vmax):    2.05 cm      PV Vmax:       0.80 m/s AV Area (Vmean):   2.07 cm      PV Vmean:      57.800 cm/s AV Area (VTI):     2.14 cm      PV VTI:        0.157 m AV Vmax:           145.00 cm/s   PV Peak grad:  2.6 mmHg AV Vmean:          102.000 cm/s  PV Mean grad:  1.0 mmHg AV VTI:            0.278 m AV Peak Grad:      8.4 mmHg AV Mean Grad:      5.0 mmHg LVOT Vmax:         105.00 cm/s LVOT Vmean:        74.300 cm/s LVOT VTI:          0.210 m LVOT/AV VTI ratio: 0.76  AORTA Ao Root diam: 2.80 cm MITRAL VALVE MV Area (PHT): 3.65 cm    SHUNTS MV Area VTI:   3.31 cm    Systemic VTI:  0.21 m MV Peak grad:  3.2 mmHg    Systemic Diam: 1.90 cm MV Mean grad:  1.0 mmHg MV Vmax:       0.89 m/s MV Vmean:      51.2 cm/s MV Decel Time: 208 msec MV E velocity: 62.10 cm/s MV A velocity: 57.00 cm/s MV E/A ratio:  1.09 Kate Sable MD Electronically signed by Kate Sable MD Signature Date/Time: 11/02/2021/6:12:04 PM    Final      Assessment and plan- Patient is a 51 y.o. female   with known history  of locally advanced left breast cancer which was ER/PR positive and HER2 negative on initial biopsy s/p neoadjuvant AC Taxol chemotherapy final pathology showed 3.6 cm residual breast mass with 7 positive lymph nodes with extracapsular extension.  ER/PR positive and HER2 positive.  She is here for on treatment assessment prior to cycle 9 of adjuvant Herceptin and Perjeta  I have reviewed her recent echocardiogram which remains within normal limits.  She will proceed with cycle 9 of adjuvant Herceptin and Perjeta today which she is getting in the form of [place] injections.  Patient is also on Zoladex for ovarian suppression along with Arimidex and she will get her  next dose today.  Zoladex again in 4 weeks.  Patient will continue to take calcium and vitamin D along with Arimidex.  Chemo induced peripheral neuropathy: Continue Lyrica.  I did renew her oxycodone prescription today but I explained to her that might of cut off to stop oxycodone would be in about 4 months and patient needs to wean herself off oxycodone by that time.  I am unable to prescribe long-term narcotics.  Neuropathic pain symptoms have remained stable overall and she is seeing orthopedics for her ongoing back pain   Visit Diagnosis 1. Encounter for monoclonal antibody treatment for malignancy   2. Malignant neoplasm of upper-outer quadrant of left breast in female, estrogen receptor positive ([place])      Dr. Randa Evens, MD, MPH [facility] [phone] 11/09/2021 12:36 PM

## 2021-11-09 NOTE — Patient Instructions (Signed)
[facility] AT [place]  Discharge Instructions: Thank you for choosing [facility] to provide your oncology and hematology care.  If you have a lab appointment with the [facility], please go directly to the [facility] and check in at the registration area.  Wear comfortable clothing and clothing appropriate for easy access to any Portacath or PICC line.   We strive to give you quality time with your provider. You may need to reschedule your appointment if you arrive late (15 or more minutes).  Arriving late affects you and other patients whose appointments are after yours.  Also, if you miss three or more appointments without notifying the office, you may be dismissed from the clinic at the providers discretion.      For prescription refill requests, have your pharmacy contact our office and allow 72 hours for refills to be completed.    Today you received the following chemotherapy and/or immunotherapy agents - [place]      To help prevent nausea and vomiting after your treatment, we encourage you to take your nausea medication as directed.  BELOW ARE SYMPTOMS THAT SHOULD BE REPORTED IMMEDIATELY: *FEVER GREATER THAN 100.4 F (38 C) OR HIGHER *CHILLS OR SWEATING *NAUSEA AND VOMITING THAT IS NOT CONTROLLED WITH YOUR NAUSEA MEDICATION *UNUSUAL SHORTNESS OF BREATH *UNUSUAL BRUISING OR BLEEDING *URINARY PROBLEMS (pain or burning when urinating, or frequent urination) *BOWEL PROBLEMS (unusual diarrhea, constipation, pain near the anus) TENDERNESS IN MOUTH AND THROAT WITH OR WITHOUT PRESENCE OF ULCERS (sore throat, sores in mouth, or a toothache) UNUSUAL RASH, SWELLING OR PAIN  UNUSUAL VAGINAL DISCHARGE OR ITCHING   Items with * indicate a potential emergency and should be followed up as soon as possible or go to the Emergency Department if any problems should occur.  Please show the CHEMOTHERAPY ALERT CARD or IMMUNOTHERAPY ALERT CARD at check-in to  the Emergency Department and triage nurse.  Should you have questions after your visit or need to cancel or reschedule your appointment, please contact [facility] CANCER [place] AT [place]  [phone] and follow the prompts.  Office hours are 8:00 a.m. to 4:30 p.m. Monday - Friday. Please note that voicemails left after 4:00 p.m. may not be returned until the following business day.  We are closed weekends and major holidays. You have access to a nurse at all times for urgent questions. Please call the main number to the clinic [phone] and follow the prompts.  For any non-urgent questions, you may also contact your provider using MyChart. We now offer e-Visits for anyone 56 and older to request care online for non-urgent symptoms. For details visit [email or web address].   Also download the MyChart app! Go to the app store, search "MyChart", open the app, select [facility], and log in with your MyChart username and password.  Due to Covid, a mask is required upon entering the hospital/clinic. If you do not have a mask, one will be given to you upon arrival. For doctor visits, patients may have 1 support person aged 82 or older with them. For treatment visits, patients cannot have anyone with them due to current Covid guidelines and our immunocompromised population.   Pertuzumab; Trastuzumab; Hyaluronidase Injection What is this medication? PERTUZUMAB (per TOOZ ue mab); TRASTUZUMAB (tras TOO zoo mab); HYALURONIDASE (hye al ur ON i dase) is used to treat breast cancer. Pertuzumab and trastuzumab are a monoclonal antibodies. Hyaluronidase is used to improve the effects of pertuzumab and trastuzumab. This medicine may be used  for other purposes; ask your health care provider or pharmacist if you have questions. COMMON BRAND NAME(S): PHESGO What should I tell my care team before I take this medication? They need to know if you have any of these conditions: heart disease high blood  pressure history of irregular heartbeat lung or breathing disease, like asthma an unusual or allergic reaction to pertuzumab, trastuzumab, hyaluronidase, other medicines, foods, dyes, or preservatives pregnant or trying to get pregnant breast-feeding How should I use this medication? This medicine is for injection under the skin. It may be given by a health care professional in a hospital or clinic setting or a health care professional may give you this medicine at home. Talk to your pediatrician regarding the use of this medicine in children. Special care may be needed. Overdosage: If you think you have taken too much of this medicine contact a poison control center or emergency room at once. NOTE: This medicine is only for you. Do not share this medicine with others. What if I miss a dose? It is important not to miss your dose. Call your doctor or health care professional if you are unable to keep an appointment. What may interact with this medication? This medicine may interact with the following medications: certain types of chemotherapy, such as daunorubicin, doxorubicin, epirubicin, and idarubicin This list may not describe all possible interactions. Give your health care provider a list of all the medicines, herbs, non-prescription drugs, or dietary supplements you use. Also tell them if you smoke, drink alcohol, or use illegal drugs. Some items may interact with your medicine. What should I watch for while using this medication? Visit your health care professional for regular checks on your progress. Tell your health care professional if your symptoms do not start to get better or if they get worse. Your condition will be monitored carefully while you are receiving this medicine. Do not become pregnant while taking this medicine or for 7 months after stopping it. Women should inform their doctor if they wish to become pregnant or think they might be pregnant. There is a potential for  serious side effects to an unborn child. Talk to your health care professional or pharmacist for more information. What side effects may I notice from receiving this medication? Side effects that you should report to your doctor or health care professional as soon as possible: allergic reactions like skin rash, itching or hives, swelling of the face, lips, or tongue breathing problems chest pain cough dizziness feeling faint; lightheaded, falls fever or chills loss of consciousness pain, tingling, numbness in the hands or feet palpitations sudden weight gain swelling of the ankles or legs unusually weak or tired Side effects that usually do not require medical attention (report these to your doctor or health care professional if they continue or are bothersome): diarrhea hair loss joint pain muscle pain nausea, vomiting pain, redness, or irritation at site where injected tiredness This list may not describe all possible side effects. Call your doctor for medical advice about side effects. You may report side effects to FDA at 1-800-FDA-1088. Where should I keep my medication? Keep out of the reach of children. If you are using this medicine at home, you will be instructed on how to store it. Throw away any unused medicine after the expiration date on the label. NOTE: This sheet is a summary. It may not cover all possible information. If you have questions about this medicine, talk to your doctor, pharmacist, or health care provider.  2022 Elsevier/Gold Standard (2021-06-28 00:00:00)

## 2021-11-09 NOTE — Progress Notes (Signed)
Zoladex today and in 4 weeks (2/20) per Dr. Janese Banks.

## 2021-11-09 NOTE — Progress Notes (Signed)
Nutrition Follow-up:  Infusion finished before RD could see patient in infusion area.  Patient agreeable for RD to call later this pm.   Patient with breast cancer. Receiving herceptin, perjeta.    Spoke with patient via phone.  Patient with questions regarding what type of foods/diet she should be following for overall health after chemotherapy treatments. Patient also concerned about keeping weight stable or gaining some more.    Medications: reviewed  Labs: reviewed  Anthropometrics:   Weight 114 lb 4.8 oz today  110 lb 12/20 GI office 116 lb on 9/26  Estimated Energy Needs  Kcals: 1300-1560 Protein: 65-78 g  Fluid: 1300-1573ml  NUTRITION DIAGNOSIS: Food and nutrition related knowledge deficit related to nutrition for cancer survivors.   INTERVENTION:  Discussed AICR guidelines for nutrition and breast cancer survivors.  Will email handout and encouraged patient to visit website for further information.  Discussed ways to add calories (ie extra virgin olive, nuts, nut butters, avocados) Patient with questions about Mediterranean diet.   Will mail patient sample meal plans and AICR guidelines for Breast Cancer Survivors Encouraged patient to explore [email or web address] Contact information to be mailed     NEXT VISIT: no follow-up RD available as needed  Kaelea Gathright B. Zenia Resides, [place], [place] Registered Dietitian [phone] (mobile)

## 2021-11-14 ENCOUNTER — Ambulatory Visit: Payer: No Typology Code available for payment source | Admitting: Physical Therapy

## 2021-11-14 ENCOUNTER — Encounter: Payer: Self-pay | Admitting: Physical Therapy

## 2021-11-14 DIAGNOSIS — M6281 Muscle weakness (generalized): Secondary | ICD-10-CM

## 2021-11-14 DIAGNOSIS — G8929 Other chronic pain: Secondary | ICD-10-CM

## 2021-11-14 DIAGNOSIS — R262 Difficulty in walking, not elsewhere classified: Secondary | ICD-10-CM

## 2021-11-14 DIAGNOSIS — M545 Low back pain, unspecified: Secondary | ICD-10-CM

## 2021-11-14 DIAGNOSIS — M533 Sacrococcygeal disorders, not elsewhere classified: Secondary | ICD-10-CM

## 2021-11-14 NOTE — Therapy (Signed)
[facility] [address] [place], [address] Phone: [phone]   Fax:  [phone]  Physical Therapy No-Visit Discharge Summary Dates of reporting from 08/25/2021 to 11/14/2021  Patient Details  Name: Tricia Berry MRN: [id number] Date of Birth: April 08, 1971 Referring Provider (PT): Burnard Hawthorne, FNP   Encounter Date: 11/14/2021    Past Medical History:  Diagnosis Date   Anxiety    Breast cancer ([place])    Diverticulitis    Family history of ovarian cancer    GERD (gastroesophageal reflux disease)    IBS (irritable bowel syndrome)    PONV (postoperative nausea and vomiting)    severe migraine and vomiting post anesthesia   Scoliosis     Past Surgical History:  Procedure Laterality Date   ABDOMINAL HYSTERECTOMY     still has ovaries, no gyn cancer, hysterectomy due to endometriosis. NO cervix on exam 01/10/21   BREAST BIOPSY Left 09/15/2020   [place] bx of mass, path pending, Q marker   BREAST BIOPSY Left 09/15/2020   [place] bx of LN, hydromarker, path pending   BREAST RECONSTRUCTION WITH PLACEMENT OF TISSUE EXPANDER AND FLEX HD (ACELLULAR HYDRATED DERMIS) Bilateral 03/17/2021   Procedure: IMMEDIATE BILATERAL BREAST RECONSTRUCTION WITH PLACEMENT OF TISSUE EXPANDER AND FLEX HD (ACELLULAR HYDRATED DERMIS);  Surgeon: Wallace Going, DO;  Location: [facility];  Service: Plastics;  Laterality: Bilateral;   IR IMAGING GUIDED PORT INSERTION  10/01/2020   MODIFIED MASTECTOMY Left 03/17/2021   Procedure: LEFT MODIFIED RADICAL MASTECTOMY;  Surgeon: Erroll Luna, MD;  Location: [facility];  Service: General;  Laterality: Left;   PORTA CATH REMOVAL Right 03/17/2021   Procedure: PORTA CATH REMOVAL;  Surgeon: Erroll Luna, MD;  Location: [facility];  Service: General;  Laterality: Right;   REMOVAL OF BILATERAL TISSUE EXPANDERS WITH PLACEMENT OF BILATERAL BREAST  IMPLANTS Bilateral 05/09/2021   Procedure: REMOVAL OF BILATERAL TISSUE EXPANDERS WITH PLACEMENT OF BILATERAL BREAST IMPLANTS;  Surgeon: Wallace Going, DO;  Location: [facility];  Service: Plastics;  Laterality: Bilateral;  90 min   TOTAL MASTECTOMY Right 03/17/2021   Procedure: TOTAL MASTECTOMY;  Surgeon: Erroll Luna, MD;  Location: [facility];  Service: General;  Laterality: Right;    There were no vitals filed for this visit.   Subjective Assessment - 11/14/21 1014     Subjective Patient called and requested discharge from PT for low back pain due to financial limitations.    Pertinent History Patient is a 52 y.o. female who presents to outpatient physical therapy with a referral for medical diagnosis Right-sided low back pain without sciatica, unspecified chronicity. This patient's chief complaints consist of worsening chronic R sided low back pain that intermittently radiates down the leg and across the back to the left side leading to the following functional deficits: sudden grabbing stops her in her tracks and she must sit down, stretching, bending, work responsibilities, dressing, petting cats, caring for cats, lifting, moving patients, yardwork, gardening, functional mobility, etc.  Relevant past medical history and comorbidities include L breast cancer stage IIIb (dx Nov 2021. ER+/PR+ HER2-, underwent a bilateral mastectomy and ALND on L (7/16) on 03/17/21, reconstruction in summer 2022, neuropathy of hands, lower legs and feet, completed chemo, completed radiation Dec17, 2021 to February 18, 2021, currently taking oral medication and infusions), scoliosis, internal hemorrhoids, atherosclerosis of aorta, hepatic steatosis, chemotherapy -induced neuropathy, anxiety/depression, chronic insomnia, arthralgia, B12 deficiency, GERD, IBS,  diverticulitis, current smoker (trying to quit), UTIs, abdominal hysterectomy.Patient denies hx of  stroke, seizures, lung problem,  major cardiac events, diabetes, changes in bowel or bladder problems, new onset stumbling or dropping things (that appears related to cancer treatments), spinal surgeries, osteoporosis (but is taking medication that can weaken bones).    Limitations House hold activities;Lifting;Standing;Walking;Other (comment)   sudden grabbing stops her in her tracks and she must sit down, stretching, bending, work responsibilities, dressing, petting cats, caring for cats, lifting, moving patients, yardwork, gardening, functional mobility, etc.   Diagnostic tests R hip xray report 08/05/2021: negative. Lumbar xray report 10/06/2021: "IMPRESSION:  Severe lumbar spine scoliosis concave right. Diffuse multilevel  degenerative change. No acute or focal bony abnormality identified." CT abdomen/pelvis report 07/18/2021: "Musculoskeletal: No acute or destructive bony lesions. Left convex  thoracolumbar scoliosis. Reconstructed images demonstrate no  additional findings. IMPRESSION:  1. Diverticulosis of the rectosigmoid colon, with no evidence of  acute diverticulitis. Mild wall thickening of the sigmoid colon  likely reflects scarring from previous bouts of diverticulitis.  2. Normal appendix.  3. Mild hepatic steatosis.  4.  Aortic Atherosclerosis (ICD10-I70.0)."            OBJECTIVE Patient is not present for examination at this time. Please see previous documentation for latest objective data.      PT Short Term Goals - 09/27/21 0957       PT SHORT TERM GOAL #1   Title Be independent with initial home exercise program for self-management of symptoms.    Baseline initial HEP to be provided at visit 2 as appropriate (08/25/2021); initial HEP provided at visit 2 (08/30/2021);    Time 2    Period Weeks    Status Achieved    Target Date 09/08/21               PT Long Term Goals - 11/14/21 1022       PT LONG TERM GOAL #1   Title Be independent with a long-term home exercise program for self-management of  symptoms.    Baseline initial HEP to be provided at visit 2 as appropriate (08/25/2021); initial HEP provided on visit 2 (08/30/2021); HEP updated to be more comprehensive for long term use with tentative discharge today (10/04/2021); patient has received long term HEP (11/14/2021):    Time 12    Period Weeks    Status Achieved   TARGET DATE FOR ALL LONG TERM GOALS: 11/17/2021     PT LONG TERM GOAL #2   Title Demonstrate improved FOTO to equal or greater than 71 by visit #10 to demonstrate improvement in overall condition and self-reported functional ability.    Baseline 53 (08/25/2021); 55 at visit #6 (10/04/2021);    Time 12    Period Weeks    Status Partially Met      PT LONG TERM GOAL #3   Title Have full lumbar AROM with no compensations outside of those expected from scoliosis or increase in pain in all planes except intermittent end range discomfort to improve ability  to complete valued activities such bending, carrying, rolling, dressing, etc.    Baseline painful and limited - see objective (08/25/2021);    Time 12    Period Weeks    Status Not Met      PT LONG TERM GOAL #4   Title Improve B hip strength to equal or greater than 4+/5 with no increase in pain to improve patients ability to complete functional tasks such as working, dressing,  walking, yardwork with less difficulty.    Baseline painful and weak - see objective (08/25/2021); patient had improved strength based on    Time 12    Period Weeks    Status Unable to assess      PT LONG TERM GOAL #5   Title Reduce pain with functional activities to equal or less than 1/10 to allow patient to complete usual activities including housework, walking, bending, sleeping with less difficulty.    Baseline up to 10/10 (08/25/2021); patient had pain exacerbation at last session recently >6/10 (11/14/2021);    Time 12    Period Weeks    Status Not Met      PT LONG TERM GOAL #6   Title Complete community, work and/or recreational  activities without limitation due to current condition.    Baseline sudden grabbing stops her in her tracks and she must sit down, stretching, bending, work responsibilities, dressing, petting cats, caring for cats, lifting, moving patients, yardwork, gardening, functional mobility, etc (08/25/2021); patient had been making progress then had pain exerbation last session (11/14/2021);    Time 12    Period Weeks    Status Not Met                   Plan - 11/14/21 1030     Clinical Impression Statement Patient has attended 8 physical therapy sessions for her low back since starting this episode of care on 08/25/2022. Patient was making some progress in functional activity tolerance and strength when she was set back by a COVID19 infection. Shortly after she returned to PT she had an acute exacerbation of her low back symptoms but appeared to be responding to an extension directional preference in the clinic and was given appropriate HEP to help manage her pain to work back to being able to perform her prior HEP focusing on core and generalized functional strengthening. Patient has requested discharge due to financial limitations and is now discharged from PT for the low back. She was hindered in her participation and activity tolerance by generalized weakness, fatigue, and total body joint pain related to her cancer diagnosis and treatment. She has been provided with a long term HEP appropriate to improve her low back pain and generalized conditioning given her acute flair of back pain decreases and she can return to it. Patient was provided with advice and guidelines at last PT visit for use of specific exercises for pain control and appropriate return to activity and long term HEP.    Personal Factors and Comorbidities Comorbidity 3+;Past/Current Experience;Profession;Fitness;Social Background;Time since onset of injury/illness/exacerbation    Comorbidities Relevant past medical history and  comorbidities include L breast cancer stage IIIb (dx Nov 2021. ER+/PR+ HER2-, underwent a bilateral mastectomy and ALND on L (7/16) on 03/17/21, reconstruction in summer 2022, neuropathy of hands, lower legs and feet, completed chemo, completed radiation Dec17, 2021 to February 18, 2021, currently taking oral medication and infusions), scoliosis, internal hemorrhoids, atherosclerosis of aorta, hepatic steatosis, chemotherapy -induced neuropathy, anxiety/depression, chronic insomnia, arthralgia, B12 deficiency, GERD, IBS, diverticulitis, current smoker (trying to quit), UTIs, abdominal hysterectomy    Examination-Activity Limitations Bed Mobility;Lift;Squat;Bend;Locomotion Level;Stand;Caring for Others;Carry;Transfers;Sleep;Dressing    Examination-Participation Restrictions Laundry;Shop;Cleaning;Community Activity;Meal Prep;Occupation;Yard Work;Interpersonal Relationship;Other   sudden grabbing stops her in her tracks and she must sit down, stretching, bending, work responsibilities, dressing, petting cats, caring for cats, lifting, moving patients, yardwork, gardening, functional mobility, etc.   Stability/Clinical Decision Making Evolving/Moderate complexity    Rehab Potential Good  PT Frequency 2x / week    PT Duration 12 weeks    PT Treatment/Interventions ADLs/Self Care Home Management;Aquatic Therapy;Cryotherapy;Gait training;Stair training;Functional mobility training;Therapeutic activities;Therapeutic exercise;Balance training;Neuromuscular re-education;Manual techniques;Dry needling;Energy conservation;Patient/family education    PT Next Visit Plan Patient is now discharged from PT for the low back due to financial constraints    PT [place] Access Code: [place] and Agree with Plan of Care Patient             Patient will benefit from skilled therapeutic intervention in order to improve the following deficits and impairments:  Abnormal gait, Impaired  sensation, Improper body mechanics, Pain, Postural dysfunction, Hypermobility, Decreased activity tolerance, Decreased endurance, Decreased range of motion, Decreased strength, Impaired perceived functional ability, Difficulty walking, Decreased balance  Visit Diagnosis: Chronic right-sided low back pain, unspecified whether sciatica present  Sacrococcygeal disorders, not elsewhere classified  Muscle weakness (generalized)  Difficulty in walking, not elsewhere classified     Problem List Patient Active Problem List   Diagnosis Date Noted   Acquired absence of breast 08/16/2021   Dysuria 08/05/2021   B12 deficiency 07/21/2021   Atherosclerosis of aorta ([place]) 07/20/2021   Hepatic steatosis 07/20/2021   Abdominal pain 07/18/2021   Breast cancer ([place]) 03/17/2021   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[place]) 03/14/2021   Genetic testing 11/22/2020   Family history of ovarian cancer    Candidal vulvovaginitis 11/01/2020   Low back pain 10/06/2020   Encounter for medical examination to establish care 09/26/2020   Malignant neoplasm of upper-outer quadrant of left breast in female, estrogen receptor positive ([place]) 09/26/2020   BACK STRAIN, LUMBAR 03/08/2010   INTERNAL HEMORRHOIDS 01/27/2010   ANAL FISSURE 01/27/2010   OSTEOARTHRITIS, HANDS, BILATERAL 01/04/2010   ARTHRALGIA 10/05/2009   INSOMNIA, CHRONIC 05/06/2009   ALLERGIC RHINITIS, SEASONAL 02/09/2009   TOBACCO ABUSE 12/29/2008   IBS 10/13/2008   Anxiety state 08/25/2008   DEPRESSION, RECURRENT 08/25/2008    Everlean Alstrom. Graylon Good, PT, DPT 11/14/21, 10:31 AM   [facility] [address] [place], [address] Phone: [phone]   Fax:  [phone]  Name: Tricia Berry MRN: [id number] Date of Birth: 27-Feb-1971

## 2021-11-15 ENCOUNTER — Encounter: Payer: Self-pay | Admitting: Physical Therapy

## 2021-11-15 ENCOUNTER — Ambulatory Visit: Payer: No Typology Code available for payment source | Admitting: Physical Therapy

## 2021-11-15 ENCOUNTER — Other Ambulatory Visit: Payer: Self-pay

## 2021-11-15 DIAGNOSIS — C50912 Malignant neoplasm of unspecified site of left female breast: Secondary | ICD-10-CM

## 2021-11-15 DIAGNOSIS — M25612 Stiffness of left shoulder, not elsewhere classified: Secondary | ICD-10-CM | POA: Diagnosis not present

## 2021-11-15 DIAGNOSIS — R293 Abnormal posture: Secondary | ICD-10-CM

## 2021-11-15 DIAGNOSIS — L599 Disorder of the skin and subcutaneous tissue related to radiation, unspecified: Secondary | ICD-10-CM

## 2021-11-15 DIAGNOSIS — Z483 Aftercare following surgery for neoplasm: Secondary | ICD-10-CM

## 2021-11-15 NOTE — Therapy (Signed)
[facility] [place], [place], [address] Phone: [phone]   Fax:  [phone]  Physical Therapy Treatment  Patient Details  Name: Tricia Berry MRN: [id number] Date of Birth: 08/28/1971 Referring Provider (PT): Burnard Hawthorne, FNP   Encounter Date: 11/15/2021   PT End of Session - 11/15/21 0954     Visit Number 16    Number of Visits 20    Date for PT Re-Evaluation 12/01/21    Authorization Type [facility] [employer] reporting period from 08/25/2021    PT Start Time 0904    PT Stop Time 0956    PT Time Calculation (min) 52 min    Activity Tolerance Patient tolerated treatment well;No increased pain    Behavior During Therapy WFL for tasks assessed/performed             Past Medical History:  Diagnosis Date   Anxiety    Breast cancer ([facility])    Diverticulitis    Family history of ovarian cancer    GERD (gastroesophageal reflux disease)    IBS (irritable bowel syndrome)    PONV (postoperative nausea and vomiting)    severe migraine and vomiting post anesthesia   Scoliosis     Past Surgical History:  Procedure Laterality Date   ABDOMINAL HYSTERECTOMY     still has ovaries, no gyn cancer, hysterectomy due to endometriosis. NO cervix on exam 01/10/21   BREAST BIOPSY Left 09/15/2020   [place] bx of mass, path pending, Q marker   BREAST BIOPSY Left 09/15/2020   [place] bx of LN, hydromarker, path pending   BREAST RECONSTRUCTION WITH PLACEMENT OF TISSUE EXPANDER AND FLEX HD (ACELLULAR HYDRATED DERMIS) Bilateral 03/17/2021   Procedure: IMMEDIATE BILATERAL BREAST RECONSTRUCTION WITH PLACEMENT OF TISSUE EXPANDER AND FLEX HD (ACELLULAR HYDRATED DERMIS);  Surgeon: Wallace Going, DO;  Location: [facility];  Service: Plastics;  Laterality: Bilateral;   IR IMAGING GUIDED PORT INSERTION  10/01/2020   MODIFIED MASTECTOMY Left 03/17/2021   Procedure: LEFT MODIFIED RADICAL MASTECTOMY;  Surgeon:  Erroll Luna, MD;  Location: [facility];  Service: General;  Laterality: Left;   PORTA CATH REMOVAL Right 03/17/2021   Procedure: PORTA CATH REMOVAL;  Surgeon: Erroll Luna, MD;  Location: [facility];  Service: General;  Laterality: Right;   REMOVAL OF BILATERAL TISSUE EXPANDERS WITH PLACEMENT OF BILATERAL BREAST IMPLANTS Bilateral 05/09/2021   Procedure: REMOVAL OF BILATERAL TISSUE EXPANDERS WITH PLACEMENT OF BILATERAL BREAST IMPLANTS;  Surgeon: Wallace Going, DO;  Location: [facility];  Service: Plastics;  Laterality: Bilateral;  90 min   TOTAL MASTECTOMY Right 03/17/2021   Procedure: TOTAL MASTECTOMY;  Surgeon: Erroll Luna, MD;  Location: [facility];  Service: General;  Laterality: Right;    There were no vitals filed for this visit.   Subjective Assessment - 11/15/21 0909     Subjective I am doing good. My arm is tight today.    Pertinent History L breast cancer ER+/PR+ HER2-, stage III, underwent a bilateral mastectomy and ALND on L (7/16) on 03/17/21, reconstruction in summer 2022, neuropathy of hands, lower legs and feet, completed radiation, completed radiation Dec17, 2021 to February 18, 2021    Patient Stated Goals to gain info from providers    Currently in Pain? No/denies    Pain Score 0-No pain  [facility] Adult PT Treatment/Exercise - 11/15/21 0001       Exercises   Exercises Other Exercises    Other Exercises  Instructed pt in pec stretch lying supine over a foam roll      Manual Therapy   Soft tissue mobilization to L pec using cocoa butter with UE in various degrees of flexion and abduction    Myofascial Release MFR done to 1 cords which was palpable in L axilla ending down into breast and upwards in to upper arm                      PT Long Term Goals - 11/15/21 0001       PT LONG TERM GOAL #1   Title Pt will return to baseline shoulder  ROM measurements and not demonstrate any signs or symptoms of lymphedema.    Baseline 05/31/21- pt has 180 degrees of flex and abd bilaterally    Time 4    Period Weeks    Status Achieved      PT LONG TERM GOAL #2   Title Pt will demonstrate 165 degrees of bilateral flexion to allow pt to reach overhead.    Baseline R 143 L 101; 05/02/21- 172 on R, 175 on L    Time 4    Period Weeks    Status Achieved      PT LONG TERM GOAL #3   Title Pt will demonstrate 165 degrees of bilateral shoulder abduction to allow her to reach out the side    Baseline R 152 L 78; 05/02/21- R 180 L 179    Time 4    Period Weeks    Status Achieved      PT LONG TERM GOAL #4   Title Pt will report she is no longer having muscle spasms across her chest to allow improved comfort    Baseline 05/02/21- pt reports she is no longer having muscle spasms    Time 4    Period Weeks    Status Achieved      PT LONG TERM GOAL #5   Title Pt will be independent in a home exercise program for long term strengthening and stretching.    Time 4    Period Weeks    Status On-going      PT LONG TERM GOAL #6   Title Pt will report a 50% improvement in edema in L trunk and chest to allow improved comfort.    Baseline 05/02/21- 20% improved; 05/31/21 no edema present    Time 4    Period Weeks    Status Achieved      PT LONG TERM GOAL #7   Title Pt will be independent in Strength ABC program for long term stretching and strengthening after completion of radiation.    Time 4    Status On-going      PT LONG TERM GOAL #8   Title Pt will demonstrate baseline ROM (180 degrees) of flexion and abduction of LUE to allow pt to return to prior level of function.    Baseline 160 flex, 164 abd; 09/29/21- flex- 160, abd - 156 both with pain from cording in axilla    Time 4    Period Weeks                   Plan - 11/15/21 0957     Clinical Impression Statement Pt continues to demonstrate tightness across L pec and cording in L  axilla that causes increased discomfort with ROM of LUE. Continued with soft tissue mobilization to L pec and myofascial release to cording. Educated pt to lie over a thick rolled up towel down her spine with arms outstretched for a pec stretch at home.    PT Frequency 2x / week    PT Duration 4 weeks    PT Treatment/Interventions ADLs/Self Care Home Management;Therapeutic exercise;Patient/family education;Scar mobilization;Passive range of motion;Manual techniques;Manual lymph drainage;Compression bandaging;Taping;Vasopneumatic Device    PT Next Visit Plan make sure pt scheduled 3 month SOZO in mid March, MFR to L axilla in area of cording, STM to L pec, pec stretching, eventually instruct in Strength ABC program; Cont every 3 month L-Dex screens for up to 2 years from her SLNB    PT Home Exercise Plan post op breast exercises, instructed pt in supine dowel, supine scap series    Consulted and Agree with Plan of Care Patient             Patient will benefit from skilled therapeutic intervention in order to improve the following deficits and impairments:  Pain, Postural dysfunction, Decreased knowledge of precautions, Impaired UE functional use, Increased fascial restricitons, Decreased strength, Decreased range of motion, Decreased scar mobility, Increased edema, Increased muscle spasms  Visit Diagnosis: Stiffness of left shoulder, not elsewhere classified  Disorder of the skin and subcutaneous tissue related to radiation, unspecified  Aftercare following surgery for neoplasm  Abnormal posture  Malignant 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, estrogen receptor positive, unspecified site of breast [facility])     Problem List Patient Active Problem List   Diagnosis Date Noted   Acquired absence of breast 08/16/2021   Dysuria 08/05/2021   B12 deficiency 07/21/2021   Atherosclerosis of aorta ([facility] [place]) 07/20/2021   Hepatic steatosis 07/20/2021   Abdominal pain 07/18/2021   Breast cancer ([facility])  03/17/2021   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[facility]) 03/14/2021   Genetic testing 11/22/2020   Family history of ovarian cancer    Candidal vulvovaginitis 11/01/2020   Low back pain 10/06/2020   Encounter for medical examination to establish care 09/26/2020   Malignant neoplasm of upper-outer quadrant of left breast in female, estrogen receptor positive ([facility]) 09/26/2020   BACK STRAIN, LUMBAR 03/08/2010   INTERNAL HEMORRHOIDS 01/27/2010   ANAL FISSURE 01/27/2010   OSTEOARTHRITIS, HANDS, BILATERAL 01/04/2010   ARTHRALGIA 10/05/2009   INSOMNIA, CHRONIC 05/06/2009   ALLERGIC RHINITIS, SEASONAL 02/09/2009   TOBACCO ABUSE 12/29/2008   IBS 10/13/2008   Anxiety state 08/25/2008   DEPRESSION, RECURRENT 08/25/2008    Manus Gunning, PT 11/15/2021, 10:06 AM  [facility] [address] [facility] [place], [place], [address] Phone: [phone]   Fax:  [phone]  Name: Tricia Berry MRN: [id number] Date of Birth: 1971-07-29

## 2021-11-16 ENCOUNTER — Encounter: Payer: Self-pay | Admitting: Family

## 2021-11-16 ENCOUNTER — Ambulatory Visit (INDEPENDENT_AMBULATORY_CARE_PROVIDER_SITE_OTHER): Payer: No Typology Code available for payment source | Admitting: Family

## 2021-11-16 ENCOUNTER — Other Ambulatory Visit: Payer: Self-pay

## 2021-11-16 ENCOUNTER — Telehealth: Payer: Self-pay | Admitting: Family

## 2021-11-16 VITALS — BP 112/72 | HR 82 | Temp 98.1°F | Ht 60.0 in | Wt 113.8 lb

## 2021-11-16 DIAGNOSIS — M545 Low back pain, unspecified: Secondary | ICD-10-CM | POA: Diagnosis not present

## 2021-11-16 DIAGNOSIS — E538 Deficiency of other specified B group vitamins: Secondary | ICD-10-CM | POA: Diagnosis not present

## 2021-11-16 DIAGNOSIS — F411 Generalized anxiety disorder: Secondary | ICD-10-CM | POA: Diagnosis not present

## 2021-11-16 DIAGNOSIS — T451X5A Adverse effect of antineoplastic and immunosuppressive drugs, initial encounter: Secondary | ICD-10-CM

## 2021-11-16 DIAGNOSIS — Z8249 Family history of ischemic heart disease and other diseases of the circulatory system: Secondary | ICD-10-CM

## 2021-11-16 DIAGNOSIS — G620 Drug-induced polyneuropathy: Secondary | ICD-10-CM

## 2021-11-16 DIAGNOSIS — I700 Atherosclerosis of aorta: Secondary | ICD-10-CM

## 2021-11-16 LAB — B12 AND FOLATE PANEL
Folate: 12.1 ng/mL (ref >5.9)
Vitamin B-12: 176 pg/mL — ABNORMAL LOW (ref 211–911)

## 2021-11-16 MED ORDER — CYANOCOBALAMIN 1000 MCG/ML IJ SOLN
INTRAMUSCULAR | 4 refills | Status: DC
  Filled 2021-11-16: qty 3, 84d supply, fill #0
  Filled 2022-04-05: qty 3, 90d supply, fill #1
  Filled 2022-07-06: qty 3, 90d supply, fill #2
  Filled 2022-09-28: qty 3, 90d supply, fill #3

## 2021-11-16 MED ORDER — FLUOXETINE HCL 20 MG PO CAPS: 20.0000 mg | ORAL_CAPSULE | Freq: Every morning | ORAL | 3 refills | Status: DC

## 2021-11-16 MED ORDER — PREGABALIN 100 MG PO CAPS: 100.0000 mg | ORAL_CAPSULE | Freq: Two times a day (BID) | ORAL | 2 refills | Status: DC

## 2021-11-16 NOTE — Progress Notes (Signed)
Subjective:    Patient ID: Tricia Berry, female    DOB: Jan 18, 1971, 51 y.o.   MRN: [id number]  CC: Tricia Berry is a 51 y.o. female who presents today for follow up.   HPI: She endorses increased anxiety regarding her health over the past year.  Cymbalta has been very helpful for managing anxiety unfortunately she experience hot flashes on this medication.  These have since resolved that she has stopped Cymbalta   CLBP-she would also like to reduce oxycodone and she has self reduced from 4 tablets a day to 3 tablets with breakthrough pain.  She would like to ultimately be off this medication altogether she worries about this being habit-forming.   She is not very physically active as this is limited by her chronic low back pain and she suspects that she has deconditioned over the past year as well.   She does go for walks and denies chest pain, left arm numbness.  Occasional shortness of breath with activity which is self-limiting. She is a current smoker  Atherosclerosis-compliant with Crestor 5 mg  Chronic low back pain-compliant with Lyrica 75 mg twice daily.  She has tizanidine 4 mg as needed TID.   She is no longer on cymbalta  continues to follow with Dr. Janese Banks, last visit 11/09/2021 for left breast cancer.  Currently undergoing chemotherapy.  Chemo induced peripheral neuropathy-she is compliant with Lyrica and as needed oxycodone IR 10mg   as prescribed by Dr. Janese Banks  Consult with Carolee Rota 11/07/2021 for right low back pain.  No benefit from physical therapy Order placed for ESI  HISTORY:  Past Medical History:  Diagnosis Date   Anxiety    Breast cancer ([facility])    Diverticulitis    Family history of ovarian cancer    GERD (gastroesophageal reflux disease)    IBS (irritable bowel syndrome)    PONV (postoperative nausea and vomiting)    severe migraine and vomiting post anesthesia   Scoliosis    Past Surgical History:  Procedure Laterality Date    ABDOMINAL HYSTERECTOMY     still has ovaries, no gyn cancer, hysterectomy due to endometriosis. NO cervix on exam 01/10/21   BREAST BIOPSY Left 09/15/2020   [place] bx of mass, path pending, Q marker   BREAST BIOPSY Left 09/15/2020   [place] bx of LN, hydromarker, path pending   BREAST RECONSTRUCTION WITH PLACEMENT OF TISSUE EXPANDER AND FLEX HD (ACELLULAR HYDRATED DERMIS) Bilateral 03/17/2021   Procedure: IMMEDIATE BILATERAL BREAST RECONSTRUCTION WITH PLACEMENT OF TISSUE EXPANDER AND FLEX HD (ACELLULAR HYDRATED DERMIS);  Surgeon: Wallace Going, DO;  Location: [facility];  Service: Plastics;  Laterality: Bilateral;   IR IMAGING GUIDED PORT INSERTION  10/01/2020   MODIFIED MASTECTOMY Left 03/17/2021   Procedure: LEFT MODIFIED RADICAL MASTECTOMY;  Surgeon: Erroll Luna, MD;  Location: [facility];  Service: General;  Laterality: Left;   PORTA CATH REMOVAL Right 03/17/2021   Procedure: PORTA CATH REMOVAL;  Surgeon: Erroll Luna, MD;  Location: [facility];  Service: General;  Laterality: Right;   REMOVAL OF BILATERAL TISSUE EXPANDERS WITH PLACEMENT OF BILATERAL BREAST IMPLANTS Bilateral 05/09/2021   Procedure: REMOVAL OF BILATERAL TISSUE EXPANDERS WITH PLACEMENT OF BILATERAL BREAST IMPLANTS;  Surgeon: Wallace Going, DO;  Location: [facility];  Service: Plastics;  Laterality: Bilateral;  90 min   TOTAL MASTECTOMY Right 03/17/2021   Procedure: TOTAL MASTECTOMY;  Surgeon: Erroll Luna, MD;  Location: [facility];  Service:  General;  Laterality: Right;   Family History  Problem Relation Age of Onset   Hypertension Mother    Osteoarthritis Mother    Diverticulitis Mother    Heart failure Father    Hypertension Father    Gout Father    Heart attack Father 52   Diverticulitis Brother    Ovarian cancer Paternal Grandmother     Allergies: Morphine, Sertraline hcl, Sulfa antibiotics, Sulfamethoxazole,  and Sulfonamide derivatives Current Outpatient Medications on File Prior to Visit  Medication Sig Dispense Refill   anastrozole (ARIMIDEX) 1 MG tablet Take 1 tablet (1 mg total) by mouth daily. 30 tablet 3   Lactobacillus (PROBIOTIC ACIDOPHILUS PO) Take 1 capsule by mouth daily.     lidocaine-prilocaine (EMLA) cream Apply 1 application topically as needed. 30 g 0   LORazepam (ATIVAN) 0.5 MG tablet Take 1 tablet (0.5 mg total) by mouth at bedtime as needed for up to 30 doses for anxiety. 30 tablet 0   Multiple Vitamins-Minerals (MULTIVITAL PO) Take 1 Dose by mouth daily.     nicotine (NICODERM CQ - DOSED IN MG/24 HOURS) 21 mg/24hr patch use 1 patch daily 28 patch 0   nicotine (NICOTROL) 10 MG inhaler FOLLOW INSTRUCTIONS ON PACKAGE AS NEEDED FOR SMOKING CESSATION 168 each 0   nicotine polacrilex (SM NICOTINE) 2 MG lozenge Take by mouth. 72 lozenge 0   ondansetron (ZOFRAN) 8 MG tablet Take 1 tablet (8 mg total) by mouth every 8 (eight) hours as needed for nausea or vomiting. 60 tablet 0   Oxycodone HCl 10 MG TABS Take 1 tablet (10 mg total) by mouth every 6 (six) hours as needed. 120 tablet 0   rosuvastatin (CRESTOR) 5 MG tablet Take 1 tablet (5 mg total) by mouth every evening. 90 tablet 3   tiZANidine (ZANAFLEX) 4 MG tablet Take 4 mg by mouth 3 (three) times daily.     traZODone (DESYREL) 50 MG tablet Take 0.5-1 tablets (25-50 mg total) by mouth at bedtime as needed for sleep. 30 tablet 3   trimethoprim (TRIMPEX) 100 MG tablet Take 1 tablet (100 mg total) by mouth daily. 30 tablet 5   Current Facility-Administered Medications on File Prior to Visit  Medication Dose Route Frequency Provider Last Rate Last Admin   goserelin (ZOLADEX) injection 3.6 mg  3.6 mg Subcutaneous Q28 days Sindy Guadeloupe, MD   3.6 mg at 05/04/21 1150   heparin lock flush 100 unit/mL  500 Units Intravenous Once Sindy Guadeloupe, MD        Social History   Tobacco Use   Smoking status: Every Day     Packs/day: 0.25    Types: Cigarettes   Smokeless tobacco: Never   Tobacco comments:    Pt trying to quit now. Has reduced amount significantly  Vaping Use   Vaping Use: Never used  Substance Use Topics   Alcohol use: Not Currently   Drug use: No    Review of Systems  Constitutional:  Negative for chills and fever.  Respiratory:  Positive for shortness of breath (occassional with activity). Negative for cough.   Cardiovascular:  Negative for chest pain and palpitations.  Gastrointestinal:  Negative for nausea and vomiting.  Musculoskeletal:  Positive for back pain.  Neurological:  Positive for numbness.  Psychiatric/Behavioral:  The patient is nervous/anxious.      Objective:    BP 112/72 (BP Location: Right Arm, Patient Position: Sitting, Cuff Size: Normal)    Pulse 82    Temp 98.1 F (  36.7 C) (Oral)    Ht 5' (1.524 m)    Wt 113 lb 12.8 oz (51.6 kg)    SpO2 98%    BMI 22.23 kg/m  BP Readings from Last 3 Encounters:  11/16/21 112/72  11/09/21 112/77  11/09/21 121/84   Wt Readings from Last 3 Encounters:  11/16/21 113 lb 12.8 oz (51.6 kg)  11/09/21 114 lb 4.8 oz (51.8 kg)  10/11/21 110 lb (49.9 kg)    Physical Exam Vitals reviewed.  Constitutional:      Appearance: She is well-developed.  Eyes:     Conjunctiva/sclera: Conjunctivae normal.  Cardiovascular:     Rate and Rhythm: Normal rate and regular rhythm.     Pulses: Normal pulses.     Heart sounds: Normal heart sounds.  Pulmonary:     Effort: Pulmonary effort is normal.     Breath sounds: Normal breath sounds. No wheezing, rhonchi or rales.  Skin:    General: Skin is warm and dry.  Neurological:     Mental Status: She is alert.  Psychiatric:        Speech: Speech normal.        Behavior: Behavior normal.        Thought Content: Thought content normal.       Assessment & Plan:   Problem List Items Addressed This Visit       Cardiovascular and Mediastinum   Atherosclerosis of aorta ([facility])     Symptomatically stable.  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  We agreed to start Prozac 20 mg.  EKG performed for baseline to ensure no QT prolongation.  close follow-up      Relevant Medications   FLUoxetine (PROZAC) 20 MG capsule   Other Relevant Orders   EKG 12-Lead (Completed)   B12 deficiency   Relevant Orders   B12 and Folate Panel   Family history of heart disease    EKG obtained as baseline prior to prescribing Prozac to ensure no QT prolongation . EKG shows sinus rhythm with T wave inversion V1, V2.  No STEMI.  No prior EKG to compare too.  Discussed father's history of myocardial infarction, patient's  history of smoking and individual risk factors.  Fortunately she is asymptomatic at this time and I counseled her on the importance of very close vigilance if she would develop any symptoms prior to seeing cardiology which would necessitate going to the emergency room.  Referral to cardiology has been placed.   will follow      Relevant Orders   Ambulatory referral to Cardiology   Low back pain    Chronic, uncontrolled.  She has upcoming epidural injection with Dr. Sharlet Salina  We agreed to increase Lyrica to 100 mg twice daily.  We also discussed tramadol or even requip as question if  restless legs may be exacerbating underlying low back pain.  Long discussion and agreed with patient and her desire to decrease, discontinue oxycodone as prescribed by oncology.  Agreed to gradually decrease dosing of the next several weeks while we titrate Lyrica, consider tramadol. Close follow up.  Relevant Medications   pregabalin (LYRICA) 100 MG capsule     I have  discontinued Haevyn G. Rider "[name]"'s HYDROcodone bit-homatropine. I have also changed her pregabalin. Additionally, I am having her start on FLUoxetine. Lastly, I am having her maintain her Multiple Vitamins-Minerals (MULTIVITAL PO), Lactobacillus (PROBIOTIC ACIDOPHILUS PO), traZODone, lidocaine-prilocaine, rosuvastatin, nicotine, nicotine, nicotine polacrilex, tiZANidine, LORazepam, trimethoprim, Oxycodone HCl, ondansetron, anastrozole, and cyanocobalamin.   Meds ordered this encounter  Medications   pregabalin (LYRICA) 100 MG capsule    Sig: Take 1 capsule (100 mg total) by mouth 2 (two) times daily.    Dispense:  60 capsule    Refill:  2    Order Specific Question:   Supervising Provider    Answer:   Deborra Medina L [[id number]]   FLUoxetine (PROZAC) 20 MG capsule    Sig: Take 1 capsule (20 mg total) by mouth every morning.    Dispense:  90 capsule    Refill:  3    Order Specific Question:   Supervising Provider    Answer:   Deborra Medina L [[id number]]   cyanocobalamin (,VITAMIN B-12,) 1000 MCG/ML injection    Sig: 1000 mcg (1 mL) intramuscular injection in the thigh ( vastus lateralis) once per month.    Dispense:  3 mL    Refill:  4    Order Specific Question:   Supervising Provider    Answer:   Crecencio Mc [[id number]]    Return precautions given.   Risks, benefits, and alternatives of the medications and treatment plan prescribed today were discussed, and patient expressed understanding.   Education regarding symptom management and diagnosis given to patient on AVS.  Continue to follow with Burnard Hawthorne, FNP for routine health maintenance.   Alden Benjamin Headley and I agreed with plan.   Mable Paris, FNP

## 2021-11-16 NOTE — Telephone Encounter (Signed)
Patient was seen today 1/25 and is wanting to know why exactly she needed her b12 with folate. Please give patient a call.

## 2021-11-16 NOTE — Assessment & Plan Note (Addendum)
Suboptimal control.  We agreed to start Prozac 20 mg.  EKG performed for baseline to ensure no QT prolongation.  close follow-up

## 2021-11-16 NOTE — Assessment & Plan Note (Signed)
Symptomatically stable.  Continue Crestor 5 mg

## 2021-11-16 NOTE — Telephone Encounter (Signed)
Pt was just confused because when she checked out she was told that she needed a fasting B-12 & folate drawn. I advised that maybe there was an old message in Margaret's check notes? B-12 & folate would not need to be repeated. She just follows up in 2 weeks with [name].

## 2021-11-16 NOTE — Patient Instructions (Addendum)
Please ensure you have repeat urine as blood was seen in urine with Dr Erlene Quan.  Consider tramadol or requip for addition of pain management while we decrease oxycodone gradually.  Increase lyrica to 100 mg twice daily.   Start prozac 20mg   Referral to cardiology.Let us know if you dont hear back within a week in regards to an appointment being scheduled.    As discussed, let's start by scheduling Tylenol Arthritis which is a 650mg  tablet .   You may take 1-2 tablets every 8 hours ( scheduled) with maximum of 6 tablets per day.   For example , you could take two tablets in the morning ( 8am) and then two tablets again at 4pm.

## 2021-11-16 NOTE — Assessment & Plan Note (Signed)
Chronic, uncontrolled.  She has upcoming epidural injection with Dr. Sharlet Salina  We agreed to increase Lyrica to 100 mg twice daily.  We also discussed tramadol or even requip as question if  restless legs may be exacerbating underlying low back pain.  Long discussion and agreed with patient and her desire to decrease, discontinue oxycodone as prescribed by oncology.  Agreed to gradually decrease dosing of the next several weeks while we titrate Lyrica, consider tramadol. Close follow up.

## 2021-11-16 NOTE — Assessment & Plan Note (Signed)
EKG obtained as baseline prior to prescribing Prozac to ensure no QT prolongation . EKG shows sinus rhythm with T wave inversion V1, V2.  No STEMI.  No prior EKG to compare too.  Discussed father's history of myocardial infarction, patient's  history of smoking and individual risk factors.  Fortunately she is asymptomatic at this time and I counseled her on the importance of very close vigilance if she would develop any symptoms prior to seeing cardiology which would necessitate going to the emergency room.  Referral to cardiology has been placed.   will follow

## 2021-11-18 ENCOUNTER — Encounter: Payer: Self-pay | Admitting: Urology

## 2021-11-18 ENCOUNTER — Other Ambulatory Visit: Payer: No Typology Code available for payment source

## 2021-11-18 ENCOUNTER — Ambulatory Visit: Payer: No Typology Code available for payment source

## 2021-11-18 ENCOUNTER — Encounter: Payer: Self-pay | Admitting: Family

## 2021-11-18 DIAGNOSIS — R3129 Other microscopic hematuria: Secondary | ICD-10-CM

## 2021-11-21 ENCOUNTER — Ambulatory Visit
Admission: RE | Admit: 2021-11-21 | Discharge: 2021-11-21 | Disposition: A | Discharge status: Home / Self Care | Payer: No Typology Code available for payment source | Attending: Gastroenterology | Admitting: Gastroenterology

## 2021-11-21 ENCOUNTER — Encounter
Admission: RE | Disposition: A | Discharge status: Home / Self Care | Payer: Self-pay | Source: Home / Self Care | Attending: Gastroenterology

## 2021-11-21 ENCOUNTER — Encounter: Payer: Self-pay | Admitting: Gastroenterology

## 2021-11-21 ENCOUNTER — Encounter: Payer: No Typology Code available for payment source | Admitting: Physical Therapy

## 2021-11-21 ENCOUNTER — Ambulatory Visit: Payer: No Typology Code available for payment source | Admitting: Certified Registered"

## 2021-11-21 DIAGNOSIS — K219 Gastro-esophageal reflux disease without esophagitis: Secondary | ICD-10-CM | POA: Insufficient documentation

## 2021-11-21 DIAGNOSIS — Z853 Personal history of malignant neoplasm of breast: Secondary | ICD-10-CM | POA: Insufficient documentation

## 2021-11-21 DIAGNOSIS — F17210 Nicotine dependence, cigarettes, uncomplicated: Secondary | ICD-10-CM | POA: Insufficient documentation

## 2021-11-21 DIAGNOSIS — Z1211 Encounter for screening for malignant neoplasm of colon: Secondary | ICD-10-CM

## 2021-11-21 DIAGNOSIS — F32A Depression, unspecified: Secondary | ICD-10-CM | POA: Insufficient documentation

## 2021-11-21 DIAGNOSIS — F419 Anxiety disorder, unspecified: Secondary | ICD-10-CM | POA: Insufficient documentation

## 2021-11-21 DIAGNOSIS — K5730 Diverticulosis of large intestine without perforation or abscess without bleeding: Secondary | ICD-10-CM | POA: Insufficient documentation

## 2021-11-21 HISTORY — PX: COLONOSCOPY WITH PROPOFOL: SHX5780

## 2021-11-21 SURGERY — COLONOSCOPY WITH PROPOFOL
Anesthesia: General

## 2021-11-21 MED ORDER — LIDOCAINE HCL (CARDIAC) PF 100 MG/5ML IV SOSY
PREFILLED_SYRINGE | INTRAVENOUS | Status: DC | PRN
  Administered 2021-11-21: 100 mg via INTRAVENOUS

## 2021-11-21 MED ORDER — SODIUM CHLORIDE 0.9 % IV SOLN: INTRAVENOUS | Status: DC

## 2021-11-21 MED ORDER — PROPOFOL 500 MG/50ML IV EMUL
INTRAVENOUS | Status: DC | PRN
  Administered 2021-11-21: 150 ug/kg/min via INTRAVENOUS

## 2021-11-21 MED ORDER — PROPOFOL 10 MG/ML IV BOLUS
INTRAVENOUS | Status: DC | PRN
  Administered 2021-11-21: 100 mg via INTRAVENOUS

## 2021-11-21 NOTE — Anesthesia Preprocedure Evaluation (Signed)
Anesthesia Evaluation  Patient identified by MRN, date of birth, ID band Patient awake    Reviewed: Allergy & Precautions, H&P , NPO status , Patient's Chart, lab work & pertinent test results  History of Anesthesia Complications (+) PONV and history of anesthetic complications  Airway Mallampati: II  TM Distance: >3 FB Neck ROM: Full    Dental  (+) Upper Dentures, Lower Dentures   Pulmonary neg sleep apnea, neg COPD, Current SmokerPatient did not abstain from smoking.,    Pulmonary exam normal breath sounds clear to auscultation       Cardiovascular Exercise Tolerance: Good METS(-) hypertension(-) CAD and (-) Past MI Normal cardiovascular exam(-) dysrhythmias  Rhythm:Regular Rate:Normal  Normal TTE 2023   Neuro/Psych PSYCHIATRIC DISORDERS Anxiety Depression  Neuromuscular disease    GI/Hepatic Neg liver ROS, GERD  Medicated,  Endo/Other  negative endocrine ROSneg diabetes  Renal/GU negative Renal ROS  negative genitourinary   Musculoskeletal  (+) Arthritis , Osteoarthritis,    Abdominal   Peds negative pediatric ROS (+)  Hematology negative hematology ROS (+)   Anesthesia Other Findings Past Medical History: No date: Anxiety No date: Breast cancer (HCC) No date: Diverticulitis No date: Family history of ovarian cancer No date: GERD (gastroesophageal reflux disease) No date: IBS (irritable bowel syndrome) No date: PONV (postoperative nausea and vomiting)     Comment:  severe migraine and vomiting post anesthesia No date: Scoliosis  Reproductive/Obstetrics negative OB ROS                             Anesthesia Physical  Anesthesia Plan  ASA: 3  Anesthesia Plan: General   Post-op Pain Management: Minimal or no pain anticipated   Induction: Intravenous  PONV Risk Score and Plan: 4 or greater and Ondansetron, Treatment may vary due to age or medical condition, TIVA and Propofol  infusion  Airway Management Planned: Natural Airway  Additional Equipment: None  Intra-op Plan:   Post-operative Plan:   Informed Consent: I have reviewed the patients History and Physical, chart, labs and discussed the procedure including the risks, benefits and alternatives for the proposed anesthesia with the patient or authorized representative who has indicated his/her understanding and acceptance.     Dental advisory given  Plan Discussed with: Anesthesiologist and CRNA  Anesthesia Plan Comments: (Discussed risks of anesthesia with patient, including possibility of difficulty with spontaneous ventilation under anesthesia necessitating airway intervention, PONV, and rare risks such as cardiac or respiratory or neurological events, and allergic reactions. Discussed the role of CRNA in patient's perioperative care. Patient understands.)        Anesthesia Quick Evaluation

## 2021-11-21 NOTE — H&P (Addendum)
Cephas Darby, MD [address]  [place]  [place], [place] [address]  Main: [phone]  Fax: [phone] Pager: [phone]  Primary Care Physician:  Burnard Hawthorne, FNP Primary Gastroenterologist:  Dr. Cephas Darby  Pre-Procedure History & Physical: HPI:  Tricia Berry is a 51 y.o. female is here for an colonoscopy.   Past Medical History:  Diagnosis Date   Anxiety    Breast cancer ([facility])    Diverticulitis    Family history of ovarian cancer    GERD (gastroesophageal reflux disease)    IBS (irritable bowel syndrome)    PONV (postoperative nausea and vomiting)    severe migraine and vomiting post anesthesia   Scoliosis     Past Surgical History:  Procedure Laterality Date   ABDOMINAL HYSTERECTOMY     still has ovaries, no gyn cancer, hysterectomy due to endometriosis. NO cervix on exam 01/10/21   BREAST BIOPSY Left 09/15/2020   [place] bx of mass, path pending, Q marker   BREAST BIOPSY Left 09/15/2020   [place] bx of LN, hydromarker, path pending   BREAST RECONSTRUCTION WITH PLACEMENT OF TISSUE EXPANDER AND FLEX HD (ACELLULAR HYDRATED DERMIS) Bilateral 03/17/2021   Procedure: IMMEDIATE BILATERAL BREAST RECONSTRUCTION WITH PLACEMENT OF TISSUE EXPANDER AND FLEX HD (ACELLULAR HYDRATED DERMIS);  Surgeon: Wallace Going, DO;  Location: [facility];  Service: Plastics;  Laterality: Bilateral;   IR IMAGING GUIDED PORT INSERTION  10/01/2020   MODIFIED MASTECTOMY Left 03/17/2021   Procedure: LEFT MODIFIED RADICAL MASTECTOMY;  Surgeon: Erroll Luna, MD;  Location: [facility];  Service: General;  Laterality: Left;   PORTA CATH REMOVAL Right 03/17/2021   Procedure: PORTA CATH REMOVAL;  Surgeon: Erroll Luna, MD;  Location: [facility];  Service: General;  Laterality: Right;   REMOVAL OF BILATERAL TISSUE EXPANDERS WITH PLACEMENT OF BILATERAL BREAST IMPLANTS Bilateral 05/09/2021   Procedure: REMOVAL OF BILATERAL TISSUE  EXPANDERS WITH PLACEMENT OF BILATERAL BREAST IMPLANTS;  Surgeon: Wallace Going, DO;  Location: [facility];  Service: Plastics;  Laterality: Bilateral;  90 min   TOTAL MASTECTOMY Right 03/17/2021   Procedure: TOTAL MASTECTOMY;  Surgeon: Erroll Luna, MD;  Location: [facility];  Service: General;  Laterality: Right;    Prior to Admission medications   Medication Sig Start Date End Date Taking? Authorizing Provider  Oxycodone HCl 10 MG TABS Take 1 tablet (10 mg total) by mouth every 6 (six) hours as needed. 10/25/21  Yes Sindy Guadeloupe, MD  pregabalin (LYRICA) 100 MG capsule Take 1 capsule (100 mg total) by mouth 2 (two) times daily. 11/16/21  Yes Arnett, Yvetta Coder, FNP  rosuvastatin (CRESTOR) 5 MG tablet Take 1 tablet (5 mg total) by mouth every evening. 08/05/21  Yes Arnett, Yvetta Coder, FNP  trimethoprim (TRIMPEX) 100 MG tablet Take 1 tablet (100 mg total) by mouth daily. 10/07/21  Yes Hollice Espy, MD  anastrozole (ARIMIDEX) 1 MG tablet Take 1 tablet (1 mg total) by mouth daily. 10/28/21   Sindy Guadeloupe, MD  cyanocobalamin (,VITAMIN B-12,) 1000 MCG/ML injection 1000 mcg (1 mL) intramuscular injection in the thigh ( vastus lateralis) once per month. 11/16/21   Burnard Hawthorne, FNP  FLUoxetine (PROZAC) 20 MG capsule Take 1 capsule (20 mg total) by mouth every morning. 11/16/21   Burnard Hawthorne, FNP  Lactobacillus (PROBIOTIC ACIDOPHILUS PO) Take 1 capsule by mouth daily.    [provider]  lidocaine-prilocaine (EMLA) cream Apply 1 application  topically as needed. 06/10/21   Sindy Guadeloupe, MD  LORazepam (ATIVAN) 0.5 MG tablet Take 1 tablet (0.5 mg total) by mouth at bedtime as needed for up to 30 doses for anxiety. 09/22/21   Sindy Guadeloupe, MD  Multiple Vitamins-Minerals (MULTIVITAL PO) Take 1 Dose by mouth daily.    [provider]  nicotine (NICODERM CQ - DOSED IN MG/24 HOURS) 21 mg/24hr patch use 1 patch daily 08/16/21 08/16/22  Nicks,  Ewing Schlein, RPH  nicotine (NICOTROL) 10 MG inhaler FOLLOW INSTRUCTIONS ON PACKAGE AS NEEDED FOR SMOKING CESSATION 08/15/21   Jacquelin Hawking, NP  nicotine polacrilex (SM NICOTINE) 2 MG lozenge Take by mouth. 08/16/21   Nicks, Ewing Schlein, RPH  ondansetron (ZOFRAN) 8 MG tablet Take 1 tablet (8 mg total) by mouth every 8 (eight) hours as needed for nausea or vomiting. 10/25/21   Sindy Guadeloupe, MD  tiZANidine (ZANAFLEX) 4 MG tablet Take 4 mg by mouth 3 (three) times daily. 08/12/21   [provider]  traZODone (DESYREL) 50 MG tablet Take 0.5-1 tablets (25-50 mg total) by mouth at bedtime as needed for sleep. 03/14/21   Burnard Hawthorne, FNP    Allergies as of 11/18/2021 - Review Complete 11/18/2021  Allergen Reaction Noted   Morphine Nausea And Vomiting and Other (See Comments) 08/25/2008   Sertraline hcl     Sulfa antibiotics Rash 04/26/2021   Sulfamethoxazole Rash 12/15/2014   Sulfonamide derivatives Rash 08/25/2008    Family History  Problem Relation Age of Onset   Hypertension Mother    Osteoarthritis Mother    Diverticulitis Mother    Heart failure Father    Hypertension Father    Gout Father    Heart attack Father 85   Diverticulitis Brother    Ovarian cancer Paternal Grandmother     Social History   Socioeconomic History   Marital status: Divorced    Spouse name: Not on file   Number of children: 2   Years of education: Not on file   Highest education level: Not on file  Occupational History   Occupation: Nurse    Employer:   Tobacco Use   Smoking status: Every Day    Packs/day: 0.25    Types: Cigarettes   Smokeless tobacco: Never   Tobacco comments:    Pt trying to quit now. Has reduced amount significantly  Vaping Use   Vaping Use: Never used  Substance and Sexual Activity   Alcohol use: Not Currently   Drug use: No   Sexual activity: Not Currently    Birth control/protection: None  Other Topics Concern   Not on file  Social History  Narrative   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 She and her spouse are primary caregivers.    Social Determinants of Health   Financial Resource Strain: Not on file  Food Insecurity: Not on file  Transportation Needs: Not on file  Physical Activity: Not on file  Stress: Not on file  Social Connections: Not on file  Intimate Partner Violence: Not on file    Review of Systems: See HPI, otherwise negative ROS  Physical Exam: BP 119/89    Pulse 92    Temp (!) 97.5 F (36.4 C) (Temporal)    Resp 16    Ht 5' (1.524 m)    Wt 51.3 kg    SpO2 (!) 1%    BMI 22.07 kg/m  General:  Alert,  pleasant and cooperative in NAD Head:  Normocephalic and atraumatic. Neck:  Supple; no masses or thyromegaly. Lungs:  Clear throughout to auscultation.    Heart:  Regular rate and rhythm. Abdomen:  Soft, nontender and nondistended. Normal bowel sounds, without guarding, and without rebound.   Neurologic:  Alert and  oriented x4;  grossly normal neurologically.  Impression/Plan: Tricia Berry is here for an colonoscopy to be performed for colon cancer screening  Risks, benefits, limitations, and alternatives regarding  colonoscopy have been reviewed with the patient.  Questions have been answered.  All parties agreeable.   Sherri Sear, MD  11/21/2021, 10:18 AM

## 2021-11-21 NOTE — Transfer of Care (Addendum)
Immediate Anesthesia Transfer of Care Note  Patient: Tricia Berry  Procedure(s) Performed: COLONOSCOPY WITH PROPOFOL  Patient Location: PACU  Anesthesia Type:General  Level of Consciousness: drowsy  Airway & Oxygen Therapy: Patient Spontanous Breathing  Post-op Assessment: Report given to RN  Post vital signs: stable  Last Vitals:  Vitals Value Taken Time  BP    Temp    Pulse    Resp    SpO2      Last Pain:  Vitals:   11/21/21 0954  TempSrc: Temporal  PainSc: 4          Complications: No notable events documented.

## 2021-11-21 NOTE — Anesthesia Postprocedure Evaluation (Signed)
Anesthesia Post Note  Patient: Tricia Berry  Procedure(s) Performed: COLONOSCOPY WITH PROPOFOL  Patient location during evaluation: Endoscopy Anesthesia Type: General Level of consciousness: awake and alert Pain management: pain level controlled Vital Signs Assessment: post-procedure vital signs reviewed and stable Respiratory status: spontaneous breathing, nonlabored ventilation, respiratory function stable and patient connected to nasal cannula oxygen Cardiovascular status: blood pressure returned to baseline and stable Postop Assessment: no apparent nausea or vomiting Anesthetic complications: no   No notable events documented.   Last Vitals:  Vitals:   11/21/21 1118 11/21/21 1128  BP: 111/71 122/78  Pulse: 72 74  Resp: 14 19  Temp:    SpO2: 97% 100%    Last Pain:  Vitals:   11/21/21 1128  TempSrc:   PainSc: 0-No pain                 Arita Miss

## 2021-11-21 NOTE — Op Note (Signed)
[facility] Gastroenterology Patient Name: Tricia Berry Procedure Date: 11/21/2021 10:43 AM MRN: [id number] Account #: [id number] Date of Birth: 02-Apr-1971 Admit Type: Outpatient Age: 51 Room: [facility] ENDO ROOM 1 Gender: Female Note Status: Finalized Instrument Name: Peds Colonoscope [id number] Procedure:             Colonoscopy Indications:           Screening for colorectal malignant neoplasm, This is                         the patient's first colonoscopy Providers:             Lin Landsman MD, MD Medicines:             General Anesthesia Complications:         No immediate complications. Estimated blood loss: None. Procedure:             Pre-Anesthesia Assessment:                        - Prior to the procedure, a History and Physical was                         performed, and patient medications and allergies were                         reviewed. The patient is competent. The risks and                         benefits of the procedure and the sedation options and                         risks were discussed with the patient. All questions                         were answered and informed consent was obtained.                         Patient identification and proposed procedure were                         verified by the physician, the nurse, the                         anesthesiologist, the anesthetist and the technician                         in the pre-procedure area in the procedure room in the                         endoscopy suite. Mental Status Examination: alert and                         oriented. Airway Examination: normal oropharyngeal                         airway and neck mobility. Respiratory Examination:                         clear to  auscultation. CV Examination: normal.                         Prophylactic Antibiotics: The patient does not require                         prophylactic antibiotics. Prior Anticoagulants: The                          patient has taken no previous anticoagulant or                         antiplatelet agents. ASA Grade Assessment: III - A                         patient with severe systemic disease. After reviewing                         the risks and benefits, the patient was deemed in                         satisfactory condition to undergo the procedure. The                         anesthesia plan was to use general anesthesia.                         Immediately prior to administration of medications,                         the patient was re-assessed for adequacy to receive                         sedatives. The heart rate, respiratory rate, oxygen                         saturations, blood pressure, adequacy of pulmonary                         ventilation, and response to care were monitored                         throughout the procedure. The physical status of the                         patient was re-assessed after the procedure.                        After obtaining informed consent, the colonoscope was                         passed under direct vision. Throughout the procedure,                         the patient's blood pressure, pulse, and oxygen                         saturations were monitored continuously. The  Colonoscope was introduced through the anus and                         advanced to the 10 cm into the ileum. The colonoscopy                         was performed without difficulty. The patient                         tolerated the procedure well. The quality of the bowel                         preparation was evaluated using the BBPS [facility] Bowel                         Preparation Scale) with scores of: Right Colon = 3,                         Transverse Colon = 3 and Left Colon = 3 (entire mucosa                         seen well with no residual staining, small fragments                         of stool or opaque liquid). The  total BBPS score                         equals 9. Findings:      The perianal and digital rectal examinations were normal. Pertinent       negatives include normal sphincter tone and no palpable rectal lesions.      The terminal ileum appeared normal.      Multiple diverticula were found in the recto-sigmoid colon, sigmoid       colon and descending colon. There was no evidence of diverticular       bleeding.      The retroflexed view of the distal rectum and anal verge was normal and       showed no anal or rectal abnormalities.      The exam was otherwise without abnormality. Impression:            - The examined portion of the ileum was normal.                        - Severe diverticulosis in the recto-sigmoid colon, in                         the sigmoid colon and in the descending colon. There                         was no evidence of diverticular bleeding.                        - The distal rectum and anal verge are normal on                         retroflexion view.                        -  The examination was otherwise normal.                        - No specimens collected. Recommendation:        - Discharge patient to home (with escort).                        - Resume previous diet today.                        - Continue present medications.                        - Repeat colonoscopy in 10 years for screening                         purposes. Procedure Code(s):     --- Professional ---                        J1941, Colorectal cancer screening; colonoscopy on                         individual not meeting criteria for high risk Diagnosis Code(s):     --- Professional ---                        Z12.11, Encounter for screening for malignant neoplasm                         of colon                        K57.30, Diverticulosis of large intestine without                         perforation or abscess without bleeding CPT copyright 2019 American [employer] Association. All  rights reserved. The codes documented in this report are preliminary and upon coder review may  be revised to meet current compliance requirements. Dr. Ulyess Mort Lin Landsman MD, MD 11/21/2021 11:05:26 AM This report has been signed electronically. Number of Addenda: 0 Note Initiated On: 11/21/2021 10:43 AM Scope Withdrawal Time: 0 hours 6 minutes 11 seconds  Total Procedure Duration: 0 hours 9 minutes 22 seconds  Estimated Blood Loss:  Estimated blood loss: none.      [facility]

## 2021-11-23 ENCOUNTER — Other Ambulatory Visit: Payer: Self-pay | Admitting: Family

## 2021-11-23 ENCOUNTER — Ambulatory Visit: Payer: No Typology Code available for payment source | Attending: Surgery | Admitting: Physical Therapy

## 2021-11-23 ENCOUNTER — Other Ambulatory Visit: Payer: Self-pay | Admitting: Oncology

## 2021-11-23 ENCOUNTER — Other Ambulatory Visit: Payer: Self-pay

## 2021-11-23 ENCOUNTER — Encounter: Payer: Self-pay | Admitting: Physical Therapy

## 2021-11-23 DIAGNOSIS — R293 Abnormal posture: Secondary | ICD-10-CM

## 2021-11-23 DIAGNOSIS — M25612 Stiffness of left shoulder, not elsewhere classified: Secondary | ICD-10-CM | POA: Diagnosis present

## 2021-11-23 DIAGNOSIS — C50912 Malignant neoplasm of unspecified site of left female breast: Secondary | ICD-10-CM

## 2021-11-23 DIAGNOSIS — Z170 Estrogen receptor positive status [ER+]: Secondary | ICD-10-CM | POA: Diagnosis present

## 2021-11-23 DIAGNOSIS — L599 Disorder of the skin and subcutaneous tissue related to radiation, unspecified: Secondary | ICD-10-CM

## 2021-11-23 DIAGNOSIS — Z483 Aftercare following surgery for neoplasm: Secondary | ICD-10-CM | POA: Diagnosis present

## 2021-11-23 MED ORDER — OXYCODONE HCL 10 MG PO TABS
10.0000 mg | ORAL_TABLET | Freq: Three times a day (TID) | ORAL | 0 refills | Status: DC | PRN
  Filled 2021-11-23: qty 90, 30d supply, fill #0

## 2021-11-23 NOTE — Therapy (Signed)
[place] °[facility] °[address] °[place], [place], [address] °Phone: [phone]   Fax:  [phone] ° °Physical Therapy Treatment ° °Patient Details  °Name: Tricia Berry °MRN: [id number] °Date of Birth: 09/27/1971 °Referring Provider (PT): Arnett, Margaret G, FNP ° ° °Encounter Date: 11/23/2021 ° ° PT End of Session - 11/23/21 0954   ° ° Visit Number 17   ° Number of Visits 20   ° Date for PT Re-Evaluation 12/01/21   ° PT Start Time 0904   ° PT Stop Time 0953   ° PT Time Calculation (min) 49 min   ° Activity Tolerance Patient tolerated treatment well;No increased pain   ° Behavior During Therapy WFL for tasks assessed/performed   ° °  °  ° °  ° ° °Past Medical History:  °Diagnosis Date  ° Anxiety   ° Breast cancer (HCC)   ° Diverticulitis   ° Family history of ovarian cancer   ° GERD (gastroesophageal reflux disease)   ° IBS (irritable bowel syndrome)   ° PONV (postoperative nausea and vomiting)   ° severe migraine and vomiting post anesthesia  ° Scoliosis   ° ° °Past Surgical History:  °Procedure Laterality Date  ° ABDOMINAL HYSTERECTOMY    ° still has ovaries, no gyn cancer, hysterectomy due to endometriosis. NO cervix on exam 01/10/21  ° BREAST BIOPSY Left 09/15/2020  ° us bx of mass, path pending, Q marker  ° BREAST BIOPSY Left 09/15/2020  ° us bx of LN, hydromarker, path pending  ° BREAST RECONSTRUCTION WITH PLACEMENT OF TISSUE EXPANDER AND FLEX HD (ACELLULAR HYDRATED DERMIS) Bilateral 03/17/2021  ° Procedure: IMMEDIATE BILATERAL BREAST RECONSTRUCTION WITH PLACEMENT OF TISSUE EXPANDER AND FLEX HD (ACELLULAR HYDRATED DERMIS);  Surgeon: Dillingham, Claire S, DO;  Location: [facility];  Service: Plastics;  Laterality: Bilateral;  ° COLONOSCOPY WITH PROPOFOL N/A 11/21/2021  ° Procedure: COLONOSCOPY WITH PROPOFOL;  Surgeon: Vanga, Rohini Reddy, MD;  Location: [facility];  Service: Gastroenterology;  Laterality: N/A;  ° IR IMAGING GUIDED PORT  INSERTION  10/01/2020  ° MODIFIED MASTECTOMY Left 03/17/2021  ° Procedure: LEFT MODIFIED RADICAL MASTECTOMY;  Surgeon: Cornett, Thomas, MD;  Location: [facility];  Service: General;  Laterality: Left;  ° PORTA CATH REMOVAL Right 03/17/2021  ° Procedure: PORTA CATH REMOVAL;  Surgeon: Cornett, Thomas, MD;  Location: [facility];  Service: General;  Laterality: Right;  ° REMOVAL OF BILATERAL TISSUE EXPANDERS WITH PLACEMENT OF BILATERAL BREAST IMPLANTS Bilateral 05/09/2021  ° Procedure: REMOVAL OF BILATERAL TISSUE EXPANDERS WITH PLACEMENT OF BILATERAL BREAST IMPLANTS;  Surgeon: Dillingham, Claire S, DO;  Location: [facility];  Service: Plastics;  Laterality: Bilateral;  90 min  ° TOTAL MASTECTOMY Right 03/17/2021  ° Procedure: TOTAL MASTECTOMY;  Surgeon: Cornett, Thomas, MD;  Location:  SURGERY CENTER;  Service: General;  Laterality: Right;  ° ° °There were no vitals filed for this visit. ° ° Subjective Assessment - 11/23/21 0904   ° ° Subjective The colonoscopy did not show anything. My arm gets a little better then it gets a little worse. I think it is the tightness or the cords.   ° Pertinent History L breast cancer ER+/PR+ HER2-, stage III, underwent a bilateral mastectomy and ALND on L (7/16) on 03/17/21, reconstruction in summer 2022, neuropathy of hands, lower legs and feet, completed radiation, completed radiation Dec17, 2021 to February 18, 2021   ° Currently in Pain? No/denies   °   Pain Score 0-No pain   ° °  °  ° °  ° ° ° ° ° ° ° ° ° ° ° ° ° ° ° ° ° ° ° ° [facility] Adult PT Treatment/Exercise - 11/23/21 0001   ° °  ° Manual Therapy  ° Soft tissue mobilization to L pec using cocoa butter with UE in various degrees of flexion and abduction   ° Myofascial Release MFR done to 1 cords which was palpable in L axilla ending down into breast and upwards in to upper arm   ° °  °  ° °  ° ° ° ° ° ° ° ° ° ° ° ° PT Short Term Goals - 09/27/21 0957   ° °  ° PT SHORT TERM GOAL #1  °  Title Be independent with initial home exercise program for self-management of symptoms.   ° Baseline initial HEP to be provided at visit 2 as appropriate (08/25/2021); initial HEP provided at visit 2 (08/30/2021);   ° Time 2   ° Period Weeks   ° Status Achieved   ° Target Date 09/08/21   ° °  °  ° °  ° ° ° ° PT Long Term Goals - 11/15/21 0001   ° °  ° PT LONG TERM GOAL #1  ° Title Pt will return to baseline shoulder ROM measurements and not demonstrate any signs or symptoms of lymphedema.   ° Baseline 05/31/21- pt has 180 degrees of flex and abd bilaterally   ° Time 4   ° Period Weeks   ° Status Achieved   °  ° PT LONG TERM GOAL #2  ° Title Pt will demonstrate 165 degrees of bilateral flexion to allow pt to reach overhead.   ° Baseline R 143 L 101; 05/02/21- 172 on R, 175 on L   ° Time 4   ° Period Weeks   ° Status Achieved   °  ° PT LONG TERM GOAL #3  ° Title Pt will demonstrate 165 degrees of bilateral shoulder abduction to allow her to reach out the side   ° Baseline R 152 L 78; 05/02/21- R 180 L 179   ° Time 4   ° Period Weeks   ° Status Achieved   °  ° PT LONG TERM GOAL #4  ° Title Pt will report she is no longer having muscle spasms across her chest to allow improved comfort   ° Baseline 05/02/21- pt reports she is no longer having muscle spasms   ° Time 4   ° Period Weeks   ° Status Achieved   °  ° PT LONG TERM GOAL #5  ° Title Pt will be independent in a home exercise program for long term strengthening and stretching.   ° Time 4   ° Period Weeks   ° Status On-going   °  ° PT LONG TERM GOAL #6  ° Title Pt will report a 50% improvement in edema in L trunk and chest to allow improved comfort.   ° Baseline 05/02/21- 20% improved; 05/31/21 no edema present   ° Time 4   ° Period Weeks   ° Status Achieved   °  ° PT LONG TERM GOAL #7  ° Title Pt will be independent in Strength ABC program for long term stretching and strengthening after completion of radiation.   ° Time 4   ° Status On-going   °  ° PT LONG TERM GOAL #8  °    Title Pt will demonstrate baseline ROM (180 degrees) of flexion and abduction of LUE to allow pt to return to prior level of function.    Baseline 160 flex, 164 abd; 09/29/21- flex- 160, abd - 156 both with pain from cording in axilla    Time 4    Period Weeks                   Plan - 11/23/21 0962     Clinical Impression Statement Continued with myofascial release to cording in L axilla. One thick cord palpable extending down in to chest. Pt reports feeling tightness at end range of motion especially when reaching up in to a cabinet. Continued with soft tissue mobilization across L pec. By end of session cord was less palpable and pt reported less tightness.    PT Frequency 2x / week    PT Duration 4 weeks    PT Treatment/Interventions ADLs/Self Care Home Management;Therapeutic exercise;Patient/family education;Scar mobilization;Passive range of motion;Manual techniques;Manual lymph drainage;Compression bandaging;Taping;Vasopneumatic Device    PT Next Visit Plan make sure pt scheduled 3 month SOZO in mid March, MFR to L axilla in area of cording, STM to L pec, pec stretching, eventually instruct in Strength ABC program; Cont every 3 month L-Dex screens for up to 2 years from her SLNB    PT Home Exercise Plan post op breast exercises, instructed pt in supine dowel, supine scap series    Consulted and Agree with Plan of Care Patient             Patient will benefit from skilled therapeutic intervention in order to improve the following deficits and impairments:  Pain, Postural dysfunction, Decreased knowledge of precautions, Impaired UE functional use, Increased fascial restricitons, Decreased strength, Decreased range of motion, Decreased scar mobility, Increased edema, Increased muscle spasms  Visit Diagnosis: Stiffness of left shoulder, not elsewhere classified  Disorder of the skin and subcutaneous tissue related to radiation, unspecified  Aftercare following surgery for  neoplasm  Abnormal posture  Malignant 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, estrogen receptor positive, unspecified site of breast [facility])     Problem List Patient Active Problem List   Diagnosis Date Noted   Colon cancer screening    Family history of heart disease 11/16/2021   Acquired absence of breast 08/16/2021   Dysuria 08/05/2021   B12 deficiency 07/21/2021   Atherosclerosis of aorta ([place]) 07/20/2021   Hepatic steatosis 07/20/2021   Abdominal pain 07/18/2021   Breast cancer ([place]) 03/17/2021   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[place]) 03/14/2021   Genetic testing 11/22/2020   Family history of ovarian cancer    Candidal vulvovaginitis 11/01/2020   Low back pain 10/06/2020   Encounter for medical examination to establish care 09/26/2020   Malignant neoplasm of upper-outer quadrant of left breast in female, estrogen receptor positive ([place]) 09/26/2020   BACK STRAIN, LUMBAR 03/08/2010   INTERNAL HEMORRHOIDS 01/27/2010   ANAL FISSURE 01/27/2010   OSTEOARTHRITIS, HANDS, BILATERAL 01/04/2010   ARTHRALGIA 10/05/2009   INSOMNIA, CHRONIC 05/06/2009   ALLERGIC RHINITIS, SEASONAL 02/09/2009   TOBACCO ABUSE 12/29/2008   IBS 10/13/2008   Anxiety state 08/25/2008   DEPRESSION, RECURRENT 08/25/2008    Manus Gunning, PT 11/23/2021, 9:58 AM  [facility] @ [place], [place], [address] Phone: [phone]   Fax:  [phone]  Name: VELETA YAMAMOTO MRN: [id number] Date of Birth: 30-Jun-1971

## 2021-11-24 ENCOUNTER — Telehealth: Payer: Self-pay | Admitting: *Deleted

## 2021-11-24 NOTE — Telephone Encounter (Signed)
Dr. Janese Banks wanted me to discuss with pt about her pain regimen. Dr. Janese Banks has spoke to pt about tapering off on the pain meds and the ativan. Each month the refill is always 30 days and  sometimes early. I spoke to pt about the tapering. She states that she has already had a visit with Dr. Vidal Schwalbe about this. The plan is 4 pills a day 1 day then 3 the next day. That will go on 1-2 weeks and then decrease to 3 a day and next day 2 again about 1-2 weeks. Dr.Arnett said that she will start her on tramadol along the way once she gets down to small amont of pills. Also the PCP increased the lyrica and started her on depression med. Dr. Janese Banks gave her a refill 90 pills and she will continue to help with this until pt is off the pain med oxycodone. Pt wants to get off the meds but needs help and Dr. Vidal Schwalbe is helping

## 2021-11-25 ENCOUNTER — Other Ambulatory Visit: Payer: Self-pay

## 2021-11-25 ENCOUNTER — Inpatient Hospital Stay: Payer: No Typology Code available for payment source | Attending: Oncology

## 2021-11-25 ENCOUNTER — Inpatient Hospital Stay: Payer: No Typology Code available for payment source

## 2021-11-25 VITALS — BP 133/86 | HR 77 | Temp 98.1°F | Resp 18

## 2021-11-25 DIAGNOSIS — C50412 Malignant neoplasm of upper-outer quadrant of left female breast: Secondary | ICD-10-CM | POA: Insufficient documentation

## 2021-11-25 DIAGNOSIS — Z79899 Other long term (current) drug therapy: Secondary | ICD-10-CM | POA: Diagnosis not present

## 2021-11-25 DIAGNOSIS — Z170 Estrogen receptor positive status [ER+]: Secondary | ICD-10-CM

## 2021-11-25 DIAGNOSIS — Z5112 Encounter for antineoplastic immunotherapy: Secondary | ICD-10-CM | POA: Diagnosis present

## 2021-11-25 LAB — CBC WITH DIFFERENTIAL/PLATELET
Abs Immature Granulocytes: 0.01 10*3/uL (ref 0.00–0.07)
Basophils Absolute: 0.1 10*3/uL (ref 0.0–0.1)
Basophils Relative: 1 %
Eosinophils Absolute: 0.2 10*3/uL (ref 0.0–0.5)
Eosinophils Relative: 2 %
HCT: 42.1 % (ref 36.0–46.0)
Hemoglobin: 13.7 g/dL (ref 12.0–15.0)
Immature Granulocytes: 0 %
Lymphocytes Relative: 39 %
Lymphs Abs: 3 10*3/uL (ref 0.7–4.0)
MCH: 29.3 pg (ref 26.0–34.0)
MCHC: 32.5 g/dL (ref 30.0–36.0)
MCV: 90 fL (ref 80.0–100.0)
Monocytes Absolute: 0.5 10*3/uL (ref 0.1–1.0)
Monocytes Relative: 6 %
Neutro Abs: 4.1 10*3/uL (ref 1.7–7.7)
Neutrophils Relative %: 52 %
Platelets: 194 10*3/uL (ref 150–400)
RBC: 4.68 MIL/uL (ref 3.87–5.11)
RDW: 14.1 % (ref 11.5–15.5)
WBC: 7.7 10*3/uL (ref 4.0–10.5)
nRBC: 0 % (ref 0.0–0.2)

## 2021-11-25 LAB — COMPREHENSIVE METABOLIC PANEL
ALT: 19 U/L (ref 0–44)
AST: 23 U/L (ref 15–41)
Albumin: 4 g/dL (ref 3.5–5.0)
Alkaline Phosphatase: 60 U/L (ref 38–126)
Anion gap: 8 (ref 5–15)
BUN: 8 mg/dL (ref 6–20)
CO2: 25 mmol/L (ref 22–32)
Calcium: 9.2 mg/dL (ref 8.9–10.3)
Chloride: 104 mmol/L (ref 98–111)
Creatinine, Ser: 0.83 mg/dL (ref 0.44–1.00)
GFR, Estimated: >60 mL/min (ref >60)
Glucose, Bld: 148 mg/dL — ABNORMAL HIGH (ref 70–99)
Potassium: 3.9 mmol/L (ref 3.5–5.1)
Sodium: 137 mmol/L (ref 135–145)
Total Bilirubin: 0.4 mg/dL (ref 0.3–1.2)
Total Protein: 7 g/dL (ref 6.5–8.1)

## 2021-11-25 MED ORDER — TIZANIDINE HCL 4 MG PO TABS
4.0000 mg | ORAL_TABLET | Freq: Three times a day (TID) | ORAL | 0 refills | Status: DC
  Filled 2021-11-25: qty 30, 10d supply, fill #0

## 2021-11-25 MED ORDER — ZOLEDRONIC ACID 4 MG/100ML IV SOLN
4.0000 mg | INTRAVENOUS | Status: DC
  Administered 2021-11-25: 4 mg via INTRAVENOUS
  Filled 2021-11-25: qty 100

## 2021-11-25 MED ORDER — SODIUM CHLORIDE 0.9 % IV SOLN
Freq: Once | INTRAVENOUS | Status: AC
  Filled 2021-11-25: qty 250

## 2021-11-25 NOTE — Patient Instructions (Signed)
[facility]   Discharge Instructions: Thank you for choosing [facility] to provide your oncology and hematology care.  If you have a lab appointment with the [facility], please go directly to the [facility] and check in at the registration area.   We strive to give you quality time with your provider. You may need to reschedule your appointment if you arrive late (15 or more minutes).  Arriving late affects you and other patients whose appointments are after yours.  Also, if you miss three or more appointments without notifying the office, you may be dismissed from the clinic at the providers discretion.      For prescription refill requests, have your pharmacy contact our office and allow 72 hours for refills to be completed.    Today you received the following: [place].      To help prevent nausea and vomiting after your treatment, we encourage you to take your nausea medication as directed.  BELOW ARE SYMPTOMS THAT SHOULD BE REPORTED IMMEDIATELY: *FEVER GREATER THAN 100.4 F (38 C) OR HIGHER *CHILLS OR SWEATING *NAUSEA AND VOMITING THAT IS NOT CONTROLLED WITH YOUR NAUSEA MEDICATION *UNUSUAL SHORTNESS OF BREATH *UNUSUAL BRUISING OR BLEEDING *URINARY PROBLEMS (pain or burning when urinating, or frequent urination) *BOWEL PROBLEMS (unusual diarrhea, constipation, pain near the anus) TENDERNESS IN MOUTH AND THROAT WITH OR WITHOUT PRESENCE OF ULCERS (sore throat, sores in mouth, or a toothache) UNUSUAL RASH, SWELLING OR PAIN  UNUSUAL VAGINAL DISCHARGE OR ITCHING   Items with * indicate a potential emergency and should be followed up as soon as possible or go to the Emergency Department if any problems should occur.  Please show the CHEMOTHERAPY ALERT CARD or IMMUNOTHERAPY ALERT CARD at check-in to the Emergency Department and triage nurse.  Should you have questions after your visit or need to cancel or reschedule your appointment,  please contact [facility] AT [place] [facility]  Dept: [phone]  and follow the prompts.  Office hours are 8:00 a.m. to 4:30 p.m. Monday - Friday. Please note that voicemails left after 4:00 p.m. may not be returned until the following business day.  We are closed weekends and major holidays. You have access to a nurse at all times for urgent questions. Please call the main number to the clinic Dept: [phone] and follow the prompts.  For any non-urgent questions, you may also contact your provider using MyChart. We now offer e-Visits for anyone 68 and older to request care online for non-urgent symptoms. For details visit [email or web address].   Also download the MyChart app! Go to the app store, search "MyChart", open the app, select [facility], and log in with your MyChart username and password.  Due to Covid, a mask is required upon entering the hospital/clinic. If you do not have a mask, one will be given to you upon arrival. For doctor visits, patients may have 1 support person aged 85 or older with them. For treatment visits, patients cannot have anyone with them due to current Covid guidelines and our immunocompromised population.

## 2021-11-29 ENCOUNTER — Other Ambulatory Visit: Payer: Self-pay

## 2021-11-30 ENCOUNTER — Other Ambulatory Visit: Payer: Self-pay

## 2021-11-30 ENCOUNTER — Inpatient Hospital Stay: Payer: No Typology Code available for payment source

## 2021-11-30 VITALS — BP 128/74 | HR 70 | Temp 97.0°F | Resp 19 | Wt 112.4 lb

## 2021-11-30 DIAGNOSIS — Z5112 Encounter for antineoplastic immunotherapy: Secondary | ICD-10-CM | POA: Diagnosis not present

## 2021-11-30 DIAGNOSIS — C50412 Malignant neoplasm of upper-outer quadrant of left female breast: Secondary | ICD-10-CM

## 2021-11-30 MED ORDER — ACETAMINOPHEN 325 MG PO TABS
650.0000 mg | ORAL_TABLET | Freq: Once | ORAL | Status: AC
  Administered 2021-11-30: 650 mg via ORAL
  Filled 2021-11-30: qty 2

## 2021-11-30 MED ORDER — PERTUZ-TRASTUZ-HYALURON-ZZXF 60-60-2000 MG-MG-U/ML CHEMO ~~LOC~~ SOLN
10.0000 mL | Freq: Once | SUBCUTANEOUS | Status: AC
  Administered 2021-11-30: 10 mL via SUBCUTANEOUS
  Filled 2021-11-30: qty 10

## 2021-11-30 MED ORDER — DIPHENHYDRAMINE HCL 25 MG PO CAPS
50.0000 mg | ORAL_CAPSULE | Freq: Once | ORAL | Status: AC
  Administered 2021-11-30: 50 mg via ORAL
  Filled 2021-11-30: qty 2

## 2021-11-30 MED ORDER — SODIUM CHLORIDE 0.9 % IV SOLN
Freq: Once | INTRAVENOUS | Status: DC
  Filled 2021-11-30: qty 250

## 2021-11-30 NOTE — Progress Notes (Signed)
Pt observed post injection for 15 minutes per protocol. Injection site WDL. VSS. Pt stable for discharge.   Tricia Berry [employer]

## 2021-12-01 ENCOUNTER — Encounter: Payer: Self-pay | Admitting: Physical Therapy

## 2021-12-01 ENCOUNTER — Ambulatory Visit: Payer: No Typology Code available for payment source | Admitting: Physical Therapy

## 2021-12-01 DIAGNOSIS — Z483 Aftercare following surgery for neoplasm: Secondary | ICD-10-CM

## 2021-12-01 DIAGNOSIS — C50912 Malignant neoplasm of unspecified site of left female breast: Secondary | ICD-10-CM

## 2021-12-01 DIAGNOSIS — M25612 Stiffness of left shoulder, not elsewhere classified: Secondary | ICD-10-CM | POA: Diagnosis not present

## 2021-12-01 DIAGNOSIS — R293 Abnormal posture: Secondary | ICD-10-CM

## 2021-12-01 DIAGNOSIS — L599 Disorder of the skin and subcutaneous tissue related to radiation, unspecified: Secondary | ICD-10-CM

## 2021-12-01 NOTE — Therapy (Signed)
[facility] @ [place], [place], [address] Phone: [phone]   Fax:  [phone]  Physical Therapy Treatment  Patient Details  Name: Tricia Berry MRN: [id number] Date of Birth: 1971-01-05 Referring Provider (PT): Burnard Hawthorne, FNP   Encounter Date: 12/01/2021   PT End of Session - 12/01/21 1156     Visit Number 18    Number of Visits 22    Date for PT Re-Evaluation 12/29/21    PT Start Time 1110    PT Stop Time 1155    PT Time Calculation (min) 45 min    Activity Tolerance Patient tolerated treatment well    Behavior During Therapy [facility] for tasks assessed/performed             Past Medical History:  Diagnosis Date   Anxiety    Breast cancer ([place])    Diverticulitis    Family history of ovarian cancer    GERD (gastroesophageal reflux disease)    IBS (irritable bowel syndrome)    PONV (postoperative nausea and vomiting)    severe migraine and vomiting post anesthesia   Scoliosis     Past Surgical History:  Procedure Laterality Date   ABDOMINAL HYSTERECTOMY     still has ovaries, no gyn cancer, hysterectomy due to endometriosis. NO cervix on exam 01/10/21   BREAST BIOPSY Left 09/15/2020   [place] bx of mass, path pending, Q marker   BREAST BIOPSY Left 09/15/2020   [place] bx of LN, hydromarker, path pending   BREAST RECONSTRUCTION WITH PLACEMENT OF TISSUE EXPANDER AND FLEX HD (ACELLULAR HYDRATED DERMIS) Bilateral 03/17/2021   Procedure: IMMEDIATE BILATERAL BREAST RECONSTRUCTION WITH PLACEMENT OF TISSUE EXPANDER AND FLEX HD (ACELLULAR HYDRATED DERMIS);  Surgeon: Wallace Going, DO;  Location: [facility];  Service: Plastics;  Laterality: Bilateral;   COLONOSCOPY WITH PROPOFOL N/A 11/21/2021   Procedure: COLONOSCOPY WITH PROPOFOL;  Surgeon: Lin Landsman, MD;  Location: [facility];  Service: Gastroenterology;  Laterality: N/A;   IR IMAGING GUIDED PORT INSERTION  10/01/2020    MODIFIED MASTECTOMY Left 03/17/2021   Procedure: LEFT MODIFIED RADICAL MASTECTOMY;  Surgeon: Erroll Luna, MD;  Location: [facility];  Service: General;  Laterality: Left;   PORTA CATH REMOVAL Right 03/17/2021   Procedure: PORTA CATH REMOVAL;  Surgeon: Erroll Luna, MD;  Location: [facility];  Service: General;  Laterality: Right;   REMOVAL OF BILATERAL TISSUE EXPANDERS WITH PLACEMENT OF BILATERAL BREAST IMPLANTS Bilateral 05/09/2021   Procedure: REMOVAL OF BILATERAL TISSUE EXPANDERS WITH PLACEMENT OF BILATERAL BREAST IMPLANTS;  Surgeon: Wallace Going, DO;  Location: [facility];  Service: Plastics;  Laterality: Bilateral;  90 min   TOTAL MASTECTOMY Right 03/17/2021   Procedure: TOTAL MASTECTOMY;  Surgeon: Erroll Luna, MD;  Location: [facility];  Service: General;  Laterality: Right;    There were no vitals filed for this visit.   Subjective Assessment - 12/01/21 1111     Subjective I think the cording is getting better.    Pertinent History L breast cancer ER+/PR+ HER2-, stage III, underwent a bilateral mastectomy and ALND on L (7/16) on 03/17/21, reconstruction in summer 2022, neuropathy of hands, lower legs and feet, completed radiation, completed radiation Dec17, 2021 to February 18, 2021    Patient Stated Goals to gain info from providers    Currently in Pain? No/denies    Pain Score 0-No pain  [facility] Adult PT Treatment/Exercise - 12/01/21 0001       Manual Therapy   Soft tissue mobilization to L pec using cocoa butter with UE in various degrees of flexion and abduction    Myofascial Release MFR done to 1 cords which was palpable in L axilla ending down into breast and upwards in to upper arm but this is less taut than last session                          PT Long Term Goals - 12/01/21 1157       PT LONG TERM GOAL #1   Title Pt will return to  baseline shoulder ROM measurements and not demonstrate any signs or symptoms of lymphedema.    Baseline 05/31/21- pt has 180 degrees of flex and abd bilaterally    Time 4    Period Weeks    Status Achieved      PT LONG TERM GOAL #2   Title Pt will demonstrate 165 degrees of bilateral flexion to allow pt to reach overhead.    Baseline R 143 L 101; 05/02/21- 172 on R, 175 on L    Time 4    Period Weeks    Status Achieved      PT LONG TERM GOAL #3   Title Pt will demonstrate 165 degrees of bilateral shoulder abduction to allow her to reach out the side    Baseline R 152 L 78; 05/02/21- R 180 L 179    Time 4    Period Weeks    Status Achieved      PT LONG TERM GOAL #4   Title Pt will report she is no longer having muscle spasms across her chest to allow improved comfort    Baseline 05/02/21- pt reports she is no longer having muscle spasms    Time 4    Period Weeks    Status Achieved      PT LONG TERM GOAL #5   Title Pt will be independent in a home exercise program for long term strengthening and stretching.    Baseline 12/01/21- will instruct pt in Strength ABC next week    Time 4    Period Weeks    Status On-going      PT LONG TERM GOAL #6   Title Pt will report a 50% improvement in edema in L trunk and chest to allow improved comfort.    Baseline 05/02/21- 20% improved; 05/31/21 no edema present    Time 4    Period Weeks    Status Achieved      PT LONG TERM GOAL #7   Title Pt will be independent in Strength ABC program for long term stretching and strengthening after completion of radiation.    Time 4    Period Weeks    Status On-going      PT LONG TERM GOAL #8   Title Pt will demonstrate baseline ROM (180 degrees) of flexion and abduction of LUE to allow pt to return to prior level of function.    Baseline 160 flex, 164 abd; 09/29/21- flex- 160, abd - 156 both with pain from cording in axilla    Time 4    Period Weeks    Status Achieved                   Plan -  12/01/21 1159     Clinical Impression Statement Assessed pt's progress towards  goals in therapy. Her ROM has improved greatly and she is having less tightness at end range due to cording. Pt still has a thick cord in L axilla extending to chest but it is not as taut as it has been. Pt reports she has been stretching and massaging this area. Continued with STM to L pec to decrease tightness and myofascial release to cording in L axilla. Pt would benefit from continued skilled PT services to instruct pt in Strength After Breast Cancer program and assess pt independence with this.    PT Frequency 1x / week    PT Duration 4 weeks    PT Treatment/Interventions ADLs/Self Care Home Management;Therapeutic exercise;Patient/family education;Scar mobilization;Passive range of motion;Manual techniques;Manual lymph drainage;Compression bandaging;Taping;Vasopneumatic Device    PT Next Visit Plan instruct in Strength ABC program; Cont every 3 month L-Dex screens for up to 2 years from her SLNB    PT Home Exercise Plan post op breast exercises, instructed pt in supine dowel, supine scap series    Consulted and Agree with Plan of Care Patient             Patient will benefit from skilled therapeutic intervention in order to improve the following deficits and impairments:  Pain, Postural dysfunction, Decreased knowledge of precautions, Impaired UE functional use, Increased fascial restricitons, Decreased strength, Decreased range of motion, Decreased scar mobility, Increased edema, Increased muscle spasms  Visit Diagnosis: Stiffness of left shoulder, not elsewhere classified  Disorder of the skin and subcutaneous tissue related to radiation, unspecified  Aftercare following surgery for neoplasm  Abnormal posture  Malignant 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, estrogen receptor positive, unspecified site of breast [facility])     Problem List Patient Active Problem List   Diagnosis Date Noted   Colon cancer  screening    Family history of heart disease 11/16/2021   Acquired absence of breast 08/16/2021   Dysuria 08/05/2021   B12 deficiency 07/21/2021   Atherosclerosis of aorta ([place]) 07/20/2021   Hepatic steatosis 07/20/2021   Abdominal pain 07/18/2021   Breast cancer ([place]) 03/17/2021   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[place]) 03/14/2021   Genetic testing 11/22/2020   Family history of ovarian cancer    Candidal vulvovaginitis 11/01/2020   Low back pain 10/06/2020   Encounter for medical examination to establish care 09/26/2020   Malignant neoplasm of upper-outer quadrant of left breast in female, estrogen receptor positive ([place]) 09/26/2020   BACK STRAIN, LUMBAR 03/08/2010   INTERNAL HEMORRHOIDS 01/27/2010   ANAL FISSURE 01/27/2010   OSTEOARTHRITIS, HANDS, BILATERAL 01/04/2010   ARTHRALGIA 10/05/2009   INSOMNIA, CHRONIC 05/06/2009   ALLERGIC RHINITIS, SEASONAL 02/09/2009   TOBACCO ABUSE 12/29/2008   IBS 10/13/2008   Anxiety state 08/25/2008   DEPRESSION, RECURRENT 08/25/2008    Manus Gunning, PT 12/01/2021, 12:01 PM  [facility] @ [place], [place], [address] Phone: [phone]   Fax:  [phone]  Name: Tricia Berry MRN: [id number] Date of Birth: [birth year]

## 2021-12-02 ENCOUNTER — Other Ambulatory Visit: Payer: Self-pay

## 2021-12-06 ENCOUNTER — Encounter: Payer: Self-pay | Admitting: Physical Therapy

## 2021-12-06 ENCOUNTER — Ambulatory Visit: Payer: No Typology Code available for payment source | Admitting: Physical Therapy

## 2021-12-06 ENCOUNTER — Other Ambulatory Visit: Payer: Self-pay

## 2021-12-06 DIAGNOSIS — M25612 Stiffness of left shoulder, not elsewhere classified: Secondary | ICD-10-CM | POA: Diagnosis not present

## 2021-12-06 DIAGNOSIS — R293 Abnormal posture: Secondary | ICD-10-CM

## 2021-12-06 DIAGNOSIS — Z483 Aftercare following surgery for neoplasm: Secondary | ICD-10-CM

## 2021-12-06 DIAGNOSIS — C50912 Malignant neoplasm of unspecified site of left female breast: Secondary | ICD-10-CM

## 2021-12-06 DIAGNOSIS — L599 Disorder of the skin and subcutaneous tissue related to radiation, unspecified: Secondary | ICD-10-CM

## 2021-12-06 NOTE — Therapy (Signed)
[facility] @ [address] [place], [place], [address] Phone: [phone]   Fax:  [phone]  Physical Therapy Treatment  Patient Details  Name: Tricia Berry MRN: [id number] Date of Birth: July 03, 1971 Referring Provider (PT): Burnard Hawthorne, FNP   Encounter Date: 12/06/2021   PT End of Session - 12/06/21 0956     Visit Number 19    Number of Visits 22    Date for PT Re-Evaluation 12/29/21    Authorization Type [place] FOCUS reporting period from 08/25/2021    PT Start Time 0903    PT Stop Time 0953    PT Time Calculation (min) 50 min    Activity Tolerance Patient tolerated treatment well    Behavior During Therapy [facility] for tasks assessed/performed             Past Medical History:  Diagnosis Date   Anxiety    Breast cancer ([place])    Diverticulitis    Family history of ovarian cancer    GERD (gastroesophageal reflux disease)    IBS (irritable bowel syndrome)    PONV (postoperative nausea and vomiting)    severe migraine and vomiting post anesthesia   Scoliosis     Past Surgical History:  Procedure Laterality Date   ABDOMINAL HYSTERECTOMY     still has ovaries, no gyn cancer, hysterectomy due to endometriosis. NO cervix on exam 01/10/21   BREAST BIOPSY Left 09/15/2020   [place] bx of mass, path pending, Q marker   BREAST BIOPSY Left 09/15/2020   [place] bx of LN, hydromarker, path pending   BREAST RECONSTRUCTION WITH PLACEMENT OF TISSUE EXPANDER AND FLEX HD (ACELLULAR HYDRATED DERMIS) Bilateral 03/17/2021   Procedure: IMMEDIATE BILATERAL BREAST RECONSTRUCTION WITH PLACEMENT OF TISSUE EXPANDER AND FLEX HD (ACELLULAR HYDRATED DERMIS);  Surgeon: Wallace Going, DO;  Location: [facility];  Service: Plastics;  Laterality: Bilateral;   COLONOSCOPY WITH PROPOFOL N/A 11/21/2021   Procedure: COLONOSCOPY WITH PROPOFOL;  Surgeon: Lin Landsman, MD;  Location: [facility] ENDOSCOPY;  Service:  Gastroenterology;  Laterality: N/A;   IR IMAGING GUIDED PORT INSERTION  10/01/2020   MODIFIED MASTECTOMY Left 03/17/2021   Procedure: LEFT MODIFIED RADICAL MASTECTOMY;  Surgeon: Erroll Luna, MD;  Location: [place];  Service: General;  Laterality: Left;   PORTA CATH REMOVAL Right 03/17/2021   Procedure: PORTA CATH REMOVAL;  Surgeon: Erroll Luna, MD;  Location: [place];  Service: General;  Laterality: Right;   REMOVAL OF BILATERAL TISSUE EXPANDERS WITH PLACEMENT OF BILATERAL BREAST IMPLANTS Bilateral 05/09/2021   Procedure: REMOVAL OF BILATERAL TISSUE EXPANDERS WITH PLACEMENT OF BILATERAL BREAST IMPLANTS;  Surgeon: Wallace Going, DO;  Location: [place];  Service: Plastics;  Laterality: Bilateral;  90 min   TOTAL MASTECTOMY Right 03/17/2021   Procedure: TOTAL MASTECTOMY;  Surgeon: Erroll Luna, MD;  Location: [place];  Service: General;  Laterality: Right;    There were no vitals filed for this visit.   Subjective Assessment - 12/06/21 0905     Subjective It gets better and then if I don't do anything it goes backwards. It is just my new normal.    Currently in Pain? Yes    Pain Score 4     Pain Location Back    Pain Orientation Lower    Pain Descriptors / Indicators Aching;Dull    Pain Type Chronic pain    Pain Onset In the past 7 days  Pain Frequency Constant    Aggravating Factors  doing ADLs - vacuuming, sweeping, lifting, bending    Pain Relieving Factors resting    Effect of Pain on Daily Activities pt does not vacuuming or sweep                               [facility] Adult PT Treatment/Exercise - 12/06/21 0001       Exercises   Exercises Other Exercises    Other Exercises  Instruct pt in Strength ABC program through one arm row then stopped due to time contraints. Pt held all exercises for 30 sec bilaterally and did all exercises x 10 reps with 1 lb weights bilaterally. Pt  required min verbal cues for correct form. Instructed pt in windshield wiper yoga pose for L pec stretch and also bow and arrow in R S/L with elbow bent and then progressed to elbow straight to increase pec stretch.                          PT Long Term Goals - 12/01/21 1157       PT LONG TERM GOAL #1   Title Pt will return to baseline shoulder ROM measurements and not demonstrate any signs or symptoms of lymphedema.    Baseline 05/31/21- pt has 180 degrees of flex and abd bilaterally    Time 4    Period Weeks    Status Achieved      PT LONG TERM GOAL #2   Title Pt will demonstrate 165 degrees of bilateral flexion to allow pt to reach overhead.    Baseline R 143 L 101; 05/02/21- 172 on R, 175 on L    Time 4    Period Weeks    Status Achieved      PT LONG TERM GOAL #3   Title Pt will demonstrate 165 degrees of bilateral shoulder abduction to allow her to reach out the side    Baseline R 152 L 78; 05/02/21- R 180 L 179    Time 4    Period Weeks    Status Achieved      PT LONG TERM GOAL #4   Title Pt will report she is no longer having muscle spasms across her chest to allow improved comfort    Baseline 05/02/21- pt reports she is no longer having muscle spasms    Time 4    Period Weeks    Status Achieved      PT LONG TERM GOAL #5   Title Pt will be independent in a home exercise program for long term strengthening and stretching.    Baseline 12/01/21- will instruct pt in Strength ABC next week    Time 4    Period Weeks    Status On-going      PT LONG TERM GOAL #6   Title Pt will report a 50% improvement in edema in L trunk and chest to allow improved comfort.    Baseline 05/02/21- 20% improved; 05/31/21 no edema present    Time 4    Period Weeks    Status Achieved      PT LONG TERM GOAL #7   Title Pt will be independent in Strength ABC program for long term stretching and strengthening after completion of radiation.    Time 4    Period Weeks    Status On-going  PT LONG TERM GOAL #8   Title Pt will demonstrate baseline ROM (180 degrees) of flexion and abduction of LUE to allow pt to return to prior level of function.    Baseline 160 flex, 164 abd; 09/29/21- flex- 160, abd - 156 both with pain from cording in axilla    Time 4    Period Weeks    Status Achieved                   Plan - 12/06/21 0957     Clinical Impression Statement Began instructing pt in yoga exercises to help stretch her low back and pec muscles. Educated pt she can do these at home. Issued the Strength After Breast Cancer program and instructed pt in all stretches and exercises until one arm row then stopped due to time constraints. Will continue instructing pt in the rest of the program at next session. Educated pt on proper way to progress weights with exercises as to not increase risk of lymphedema. Pt felt fatigued while doing program due to deconditioning.    PT Frequency 1x / week    PT Duration 4 weeks    PT Treatment/Interventions ADLs/Self Care Home Management;Therapeutic exercise;Patient/family education;Scar mobilization;Passive range of motion;Manual techniques;Manual lymph drainage;Compression bandaging;Taping;Vasopneumatic Device    PT Next Visit Plan cont to instruct in Strength ABC program; Cont every 3 month L-Dex screens for up to 2 years from her SLNB    PT Home Exercise Plan post op breast exercises, instructed pt in supine dowel, supine scap series, windshield wiper yoga ex, bow and arrow yoga ex, strength ABC    Consulted and Agree with Plan of Care Patient             Patient will benefit from skilled therapeutic intervention in order to improve the following deficits and impairments:  Pain, Postural dysfunction, Decreased knowledge of precautions, Impaired UE functional use, Increased fascial restricitons, Decreased strength, Decreased range of motion, Decreased scar mobility, Increased edema, Increased muscle spasms  Visit  Diagnosis: Stiffness of left shoulder, not elsewhere classified  Disorder of the skin and subcutaneous tissue related to radiation, unspecified  Aftercare following surgery for neoplasm  Abnormal posture  Malignant 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, estrogen receptor positive, unspecified site of breast [facility])     Problem List Patient Active Problem List   Diagnosis Date Noted   Colon cancer screening    Family history of heart disease 11/16/2021   Acquired absence of breast 08/16/2021   Dysuria 08/05/2021   B12 deficiency 07/21/2021   Atherosclerosis of aorta ([place]) 07/20/2021   Hepatic steatosis 07/20/2021   Abdominal pain 07/18/2021   Breast cancer ([place]) 03/17/2021   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[place]) 03/14/2021   Genetic testing 11/22/2020   Family history of ovarian cancer    Candidal vulvovaginitis 11/01/2020   Low back pain 10/06/2020   Encounter for medical examination to establish care 09/26/2020   Malignant neoplasm of upper-outer quadrant of left breast in female, estrogen receptor positive ([place]) 09/26/2020   BACK STRAIN, LUMBAR 03/08/2010   INTERNAL HEMORRHOIDS 01/27/2010   ANAL FISSURE 01/27/2010   OSTEOARTHRITIS, HANDS, BILATERAL 01/04/2010   ARTHRALGIA 10/05/2009   INSOMNIA, CHRONIC 05/06/2009   ALLERGIC RHINITIS, SEASONAL 02/09/2009   TOBACCO ABUSE 12/29/2008   IBS 10/13/2008   Anxiety state 08/25/2008   DEPRESSION, RECURRENT 08/25/2008    Manus Gunning, PT 12/06/2021, 10:03 AM  [facility] @ [place], [place], [address]  Phone: [phone]   Fax:  [phone]  Name: GRACLYN LAWTHER MRN: [id number] Date of Birth: 03-Jan-1971

## 2021-12-07 ENCOUNTER — Other Ambulatory Visit: Payer: Self-pay

## 2021-12-07 ENCOUNTER — Ambulatory Visit (INDEPENDENT_AMBULATORY_CARE_PROVIDER_SITE_OTHER): Payer: No Typology Code available for payment source | Admitting: Family

## 2021-12-07 ENCOUNTER — Encounter: Payer: Self-pay | Admitting: Family

## 2021-12-07 VITALS — BP 102/62 | HR 72 | Temp 98.5°F | Ht 60.0 in | Wt 113.4 lb

## 2021-12-07 DIAGNOSIS — F411 Generalized anxiety disorder: Secondary | ICD-10-CM | POA: Diagnosis not present

## 2021-12-07 DIAGNOSIS — M545 Low back pain, unspecified: Secondary | ICD-10-CM | POA: Diagnosis not present

## 2021-12-07 MED ORDER — TRAMADOL HCL 50 MG PO TABS
50.0000 mg | ORAL_TABLET | Freq: Two times a day (BID) | ORAL | 1 refills | Status: DC | PRN
  Filled 2021-12-07: qty 60, 30d supply, fill #0

## 2021-12-07 NOTE — Patient Instructions (Addendum)
Start tramadol 50mg  midday in place of oxycodone.   Be mindful of all of these medications that may cause you to feel sedated.  We will slowly increase as you begin to wean further off of oxycodone.  Please monitor for symptoms of serotonin syndrome as tramadol, Prozac and trazodone all increase serotonin.  This is rare however symptoms of this would include: Heart palpitations, sweatiness, insomnia.   Please let me know how you are doing

## 2021-12-07 NOTE — Assessment & Plan Note (Signed)
Slight improvement.  Continue prozac 20mg . Will follow.

## 2021-12-07 NOTE — Assessment & Plan Note (Addendum)
Chronic, stable.  Continue Lyrica 100 mg twice daily.  She continues to reduce doses of oxycodone 10 mg as prescribed by Dr. Janese Banks, oncology.  She plans to take oxycodone 10 mg in the morning and at night.  Advised that she may use tramadol 50mg  midday dose if needed.  We can slowly titrate tramadol if she comes off of oxycodone.  Controlled substance contract signed today. Close follow up.   I looked up patient on [place] Controlled Substances Reporting System PMP AWARE and saw no activity that raised concern of inappropriate use.

## 2021-12-07 NOTE — Progress Notes (Signed)
Subjective:    Patient ID: Tricia Berry, female    DOB: 05-22-1971, 51 y.o.   MRN: [id number]  CC: Tricia Berry is a 51 y.o. female who presents today for follow up.   HPI: She feels well today No new concerns   Low back pain- continues to bother her daily. She is planning to have another injection with Dr Sharlet Salina.   she is taking lyrica 100mg  bid. She is taking oxycodone 10mg  TID and plans to decrease to BID. She is doing tylenol arthritis scheduled with relief.    Started prozac 20mg  and feeling more energy. Anxiety has improved.   She is scheduled to see Dr. Charlestine Night 12/15/21   HISTORY:  Past Medical History:  Diagnosis Date   Anxiety    Breast cancer ([place])    Diverticulitis    Family history of ovarian cancer    GERD (gastroesophageal reflux disease)    IBS (irritable bowel syndrome)    PONV (postoperative nausea and vomiting)    severe migraine and vomiting post anesthesia   Scoliosis    Past Surgical History:  Procedure Laterality Date   ABDOMINAL HYSTERECTOMY     still has ovaries, no gyn cancer, hysterectomy due to endometriosis. NO cervix on exam 01/10/21   BREAST BIOPSY Left 09/15/2020   [place] bx of mass, path pending, Q marker   BREAST BIOPSY Left 09/15/2020   [place] bx of LN, hydromarker, path pending   BREAST RECONSTRUCTION WITH PLACEMENT OF TISSUE EXPANDER AND FLEX HD (ACELLULAR HYDRATED DERMIS) Bilateral 03/17/2021   Procedure: IMMEDIATE BILATERAL BREAST RECONSTRUCTION WITH PLACEMENT OF TISSUE EXPANDER AND FLEX HD (ACELLULAR HYDRATED DERMIS);  Surgeon: Wallace Going, DO;  Location: [facility];  Service: Plastics;  Laterality: Bilateral;   COLONOSCOPY WITH PROPOFOL N/A 11/21/2021   Procedure: COLONOSCOPY WITH PROPOFOL;  Surgeon: Lin Landsman, MD;  Location: [facility] ENDOSCOPY;  Service: Gastroenterology;  Laterality: N/A;   IR IMAGING GUIDED PORT INSERTION  10/01/2020   MODIFIED MASTECTOMY Left 03/17/2021   Procedure: LEFT MODIFIED  RADICAL MASTECTOMY;  Surgeon: Erroll Luna, MD;  Location: [place];  Service: General;  Laterality: Left;   PORTA CATH REMOVAL Right 03/17/2021   Procedure: PORTA CATH REMOVAL;  Surgeon: Erroll Luna, MD;  Location: [place];  Service: General;  Laterality: Right;   REMOVAL OF BILATERAL TISSUE EXPANDERS WITH PLACEMENT OF BILATERAL BREAST IMPLANTS Bilateral 05/09/2021   Procedure: REMOVAL OF BILATERAL TISSUE EXPANDERS WITH PLACEMENT OF BILATERAL BREAST IMPLANTS;  Surgeon: Wallace Going, DO;  Location: [place];  Service: Plastics;  Laterality: Bilateral;  90 min   TOTAL MASTECTOMY Right 03/17/2021   Procedure: TOTAL MASTECTOMY;  Surgeon: Erroll Luna, MD;  Location: [place];  Service: General;  Laterality: Right;   Family History  Problem Relation Age of Onset   Hypertension Mother    Osteoarthritis Mother    Diverticulitis Mother    Heart failure Father    Hypertension Father    Gout Father    Heart attack Father 71   Diverticulitis Brother    Ovarian cancer Paternal Grandmother     Allergies: Morphine, Sertraline hcl, Sulfa antibiotics, Sulfamethoxazole, and Sulfonamide derivatives Current Outpatient Medications on File Prior to Visit  Medication Sig Dispense Refill   anastrozole (ARIMIDEX) 1 MG tablet Take 1 tablet (1 mg total) by mouth daily. 30 tablet 3   cyanocobalamin (,VITAMIN B-12,) 1000 MCG/ML injection 1000 mcg (1 mL) intramuscular injection in the thigh (  vastus lateralis) once per month. 3 mL 4   FLUoxetine (PROZAC) 20 MG capsule Take 1 capsule (20 mg total) by mouth every morning. 90 capsule 3   Lactobacillus (PROBIOTIC ACIDOPHILUS PO) Take 1 capsule by mouth daily.     lidocaine-prilocaine (EMLA) cream Apply 1 application topically as needed. 30 g 0   LORazepam (ATIVAN) 0.5 MG tablet Take 1 tablet (0.5 mg total) by mouth at bedtime as needed for up to 30 doses for anxiety. 30 tablet 0    Multiple Vitamins-Minerals (MULTIVITAL PO) Take 1 Dose by mouth daily.     nicotine (NICODERM CQ - DOSED IN MG/24 HOURS) 21 mg/24hr patch use 1 patch daily 28 patch 0   nicotine (NICOTROL) 10 MG inhaler FOLLOW INSTRUCTIONS ON PACKAGE AS NEEDED FOR SMOKING CESSATION 168 each 0   nicotine polacrilex (SM NICOTINE) 2 MG lozenge Take by mouth. 72 lozenge 0   ondansetron (ZOFRAN) 8 MG tablet Take 1 tablet (8 mg total) by mouth every 8 (eight) hours as needed for nausea or vomiting. 60 tablet 0   Oxycodone HCl 10 MG TABS Take 1 tablet (10 mg total) by mouth every 8 (eight) hours as needed. 90 tablet 0   pregabalin (LYRICA) 100 MG capsule Take 1 capsule (100 mg total) by mouth 2 (two) times daily. 60 capsule 2   rosuvastatin (CRESTOR) 5 MG tablet Take 1 tablet (5 mg total) by mouth every evening. 90 tablet 3   tiZANidine (ZANAFLEX) 4 MG tablet Take 1 tablet (4 mg total) by mouth 3 (three) times daily. 30 tablet 0   traZODone (DESYREL) 50 MG tablet Take 0.5-1 tablets (25-50 mg total) by mouth at bedtime as needed for sleep. 30 tablet 3   trimethoprim (TRIMPEX) 100 MG tablet Take 1 tablet (100 mg total) by mouth daily. 30 tablet 5   Current Facility-Administered Medications on File Prior to Visit  Medication Dose Route Frequency Provider Last Rate Last Admin   goserelin (ZOLADEX) injection 3.6 mg  3.6 mg Subcutaneous Q28 days Sindy Guadeloupe, MD   3.6 mg at 05/04/21 1150   heparin lock flush 100 unit/mL  500 Units Intravenous Once Sindy Guadeloupe, MD        Social History   Tobacco Use   Smoking status: Every Day    Packs/day: 0.25    Types: Cigarettes   Smokeless tobacco: Never   Tobacco comments:    Pt trying to quit now. Has reduced amount significantly  Vaping Use   Vaping Use: Never used  Substance Use Topics   Alcohol use: Not Currently   Drug use: No    Review of Systems  Constitutional:  Negative for chills and fever.  Respiratory:  Negative for cough.   Cardiovascular:  Negative for  chest pain and palpitations.  Gastrointestinal:  Negative for nausea and vomiting.     Objective:    BP 102/62 (BP Location: Right Arm, Patient Position: Sitting, Cuff Size: Normal)    Pulse 72    Temp 98.5 F (36.9 C) (Oral)    Ht 5' (1.524 m)    Wt 113 lb 6.4 oz (51.4 kg)    SpO2 98%    BMI 22.15 kg/m  BP Readings from Last 3 Encounters:  12/07/21 102/62  11/30/21 128/74  11/25/21 133/86   Wt Readings from Last 3 Encounters:  12/07/21 113 lb 6.4 oz (51.4 kg)  11/30/21 112 lb 7 oz (51 kg)  11/21/21 113 lb (51.3 kg)    Physical Exam Vitals  reviewed.  Constitutional:      Appearance: She is well-developed.  Eyes:     Conjunctiva/sclera: Conjunctivae normal.  Cardiovascular:     Rate and Rhythm: Normal rate and regular rhythm.     Pulses: Normal pulses.     Heart sounds: Normal heart sounds.  Pulmonary:     Effort: Pulmonary effort is normal.     Breath sounds: Normal breath sounds. No wheezing, rhonchi or rales.  Skin:    General: Skin is warm and dry.  Neurological:     Mental Status: She is alert.  Psychiatric:        Speech: Speech normal.        Behavior: Behavior normal.        Thought Content: Thought content normal.       Assessment & Plan:   Problem List Items Addressed This Visit       Other   Anxiety state    Slight improvement.  Continue prozac 20mg . Will follow.       Low back pain - Primary    Chronic, stable.  Continue Lyrica 100 mg twice daily.  She continues to reduce doses of oxycodone 10 mg as prescribed by Dr. Janese Banks, oncology.  She plans to take oxycodone 10 mg in the morning and at night.  Advised that she may use tramadol 50mg  midday dose if needed.  We can slowly titrate tramadol if she comes off of oxycodone.  Controlled substance contract signed today. Close follow up.   I looked up patient on [place] Controlled Substances Reporting System PMP AWARE and saw no activity that raised concern of inappropriate use.        Relevant Medications    traMADol (ULTRAM) 50 MG tablet     I am having Tricia Berry "[name]" start on traMADol. I am also having her maintain her Multiple Vitamins-Minerals (MULTIVITAL PO), Lactobacillus (PROBIOTIC ACIDOPHILUS PO), traZODone, lidocaine-prilocaine, rosuvastatin, nicotine, nicotine, nicotine polacrilex, LORazepam, trimethoprim, ondansetron, anastrozole, pregabalin, FLUoxetine, cyanocobalamin, Oxycodone HCl, and tiZANidine.   Meds ordered this encounter  Medications   traMADol (ULTRAM) 50 MG tablet    Sig: Take 1 tablet (50 mg total) by mouth every 12 (twelve) hours as needed for up to 5 days.    Dispense:  60 tablet    Refill:  1    Order Specific Question:   Supervising Provider    Answer:   Crecencio Mc [[id number]]    Return precautions given.   Risks, benefits, and alternatives of the medications and treatment plan prescribed today were discussed, and patient expressed understanding.   Education regarding symptom management and diagnosis given to patient on AVS.  Continue to follow with Burnard Hawthorne, FNP for routine health maintenance.   Alden Benjamin Vanmaanen and I agreed with plan.   Mable Paris, FNP

## 2021-12-09 ENCOUNTER — Ambulatory Visit
Admission: RE | Admit: 2021-12-09 | Discharge: 2021-12-09 | Disposition: A | Discharge status: Home / Self Care | Payer: No Typology Code available for payment source | Source: Ambulatory Visit | Attending: Urology | Admitting: Urology

## 2021-12-09 ENCOUNTER — Other Ambulatory Visit: Payer: Self-pay

## 2021-12-09 DIAGNOSIS — R3129 Other microscopic hematuria: Secondary | ICD-10-CM | POA: Diagnosis not present

## 2021-12-09 MED ORDER — IOHEXOL 300 MG/ML  SOLN
100.0000 mL | Freq: Once | INTRAMUSCULAR | Status: AC | PRN
  Administered 2021-12-09: 100 mL via INTRAVENOUS

## 2021-12-12 ENCOUNTER — Other Ambulatory Visit: Payer: Self-pay

## 2021-12-12 ENCOUNTER — Ambulatory Visit: Payer: No Typology Code available for payment source

## 2021-12-12 ENCOUNTER — Inpatient Hospital Stay: Payer: No Typology Code available for payment source

## 2021-12-12 ENCOUNTER — Encounter: Payer: Self-pay | Admitting: Family

## 2021-12-12 DIAGNOSIS — Z5112 Encounter for antineoplastic immunotherapy: Secondary | ICD-10-CM | POA: Diagnosis not present

## 2021-12-12 DIAGNOSIS — Z170 Estrogen receptor positive status [ER+]: Secondary | ICD-10-CM

## 2021-12-12 DIAGNOSIS — H9313 Tinnitus, bilateral: Secondary | ICD-10-CM

## 2021-12-12 DIAGNOSIS — F17200 Nicotine dependence, unspecified, uncomplicated: Secondary | ICD-10-CM

## 2021-12-12 DIAGNOSIS — C50412 Malignant neoplasm of upper-outer quadrant of left female breast: Secondary | ICD-10-CM

## 2021-12-12 IMAGING — MG MM BREAST LOCALIZATION CLIP
6 series · 6 of 18 positions shown · non-contrast
Comparison: Previous exam(s).

CLINICAL DATA: Status post LEFT breast mass and lymph node biopsy

EXAM:
DIAGNOSTIC LEFT MAMMOGRAM POST ULTRASOUND BIOPSY

[L MLO synth-2D]
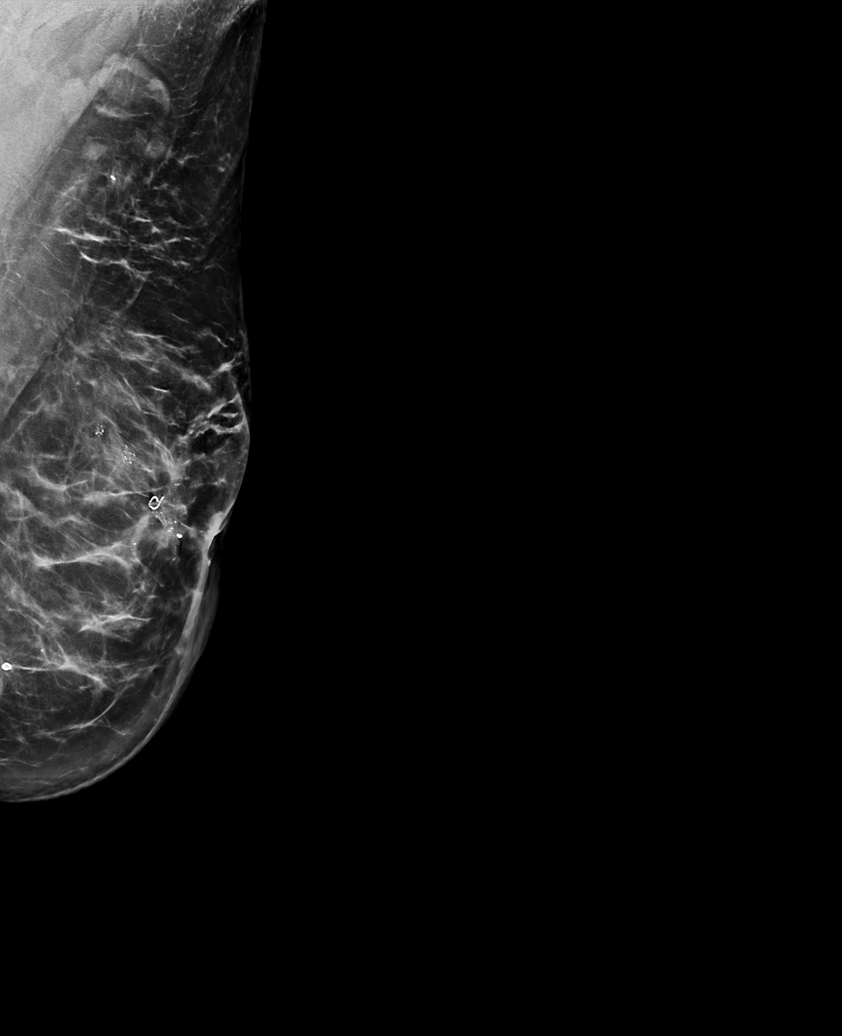

[L ML synth-2D]
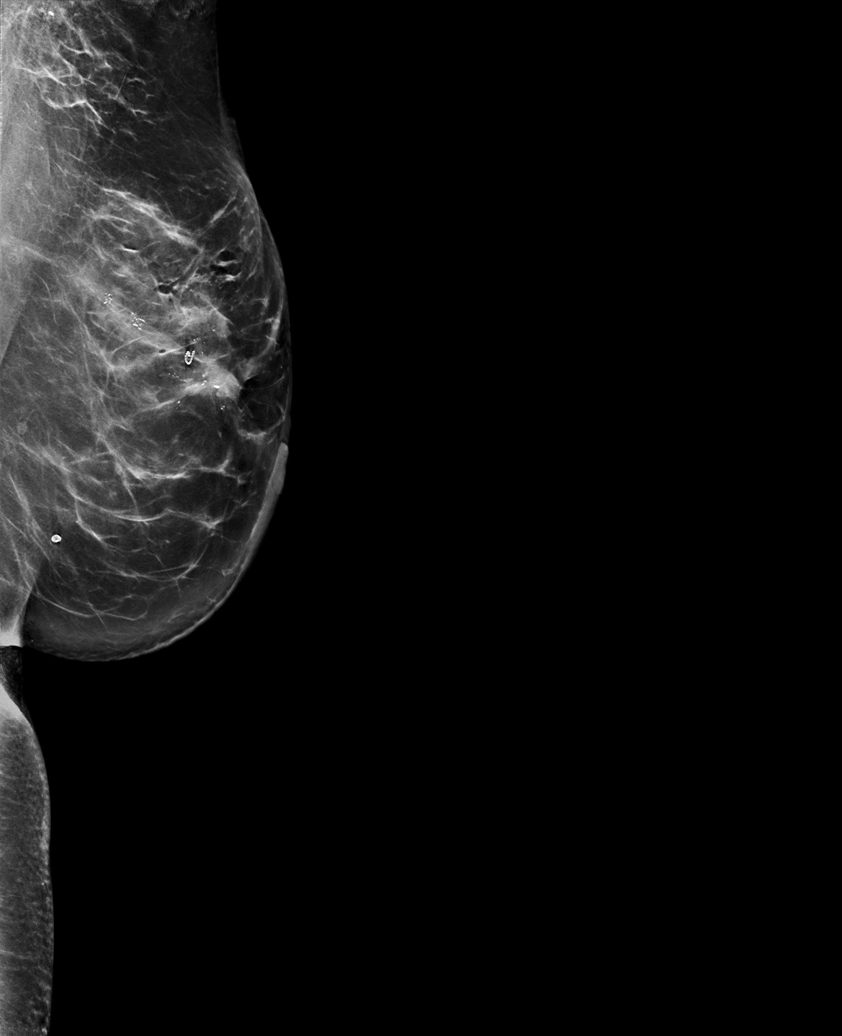

[L CC synth-2D]
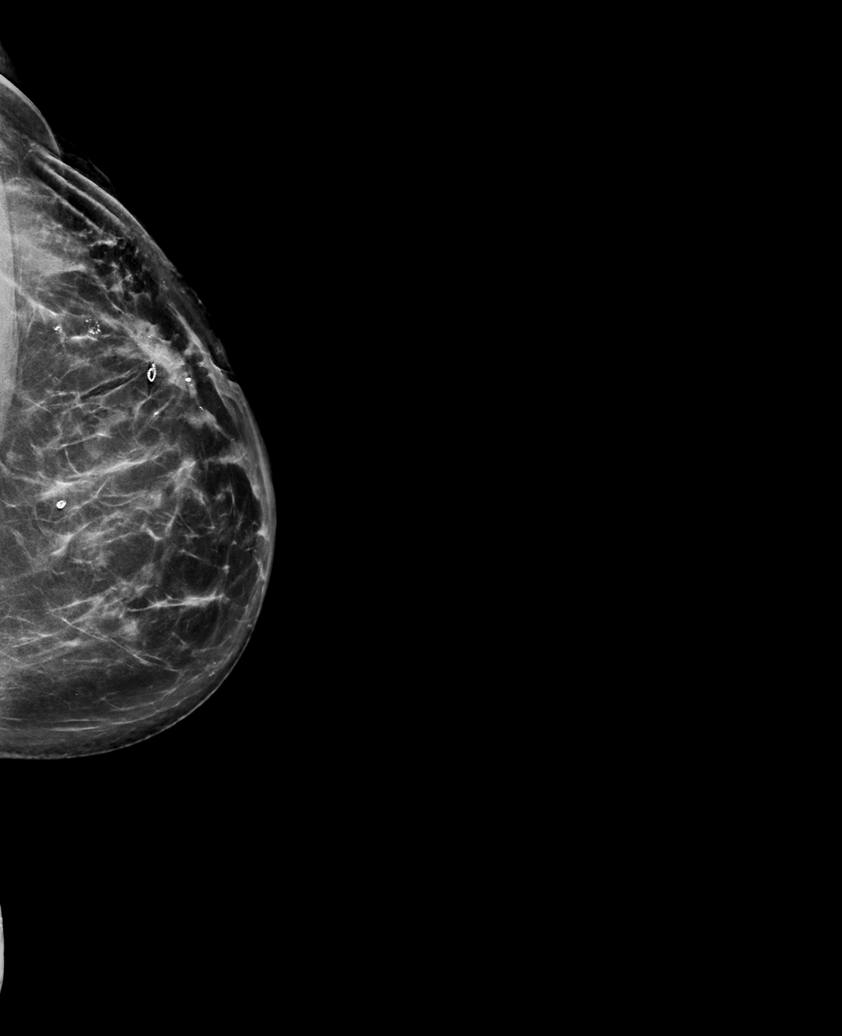

[L ML tomo · tomo slice 45/90.0]
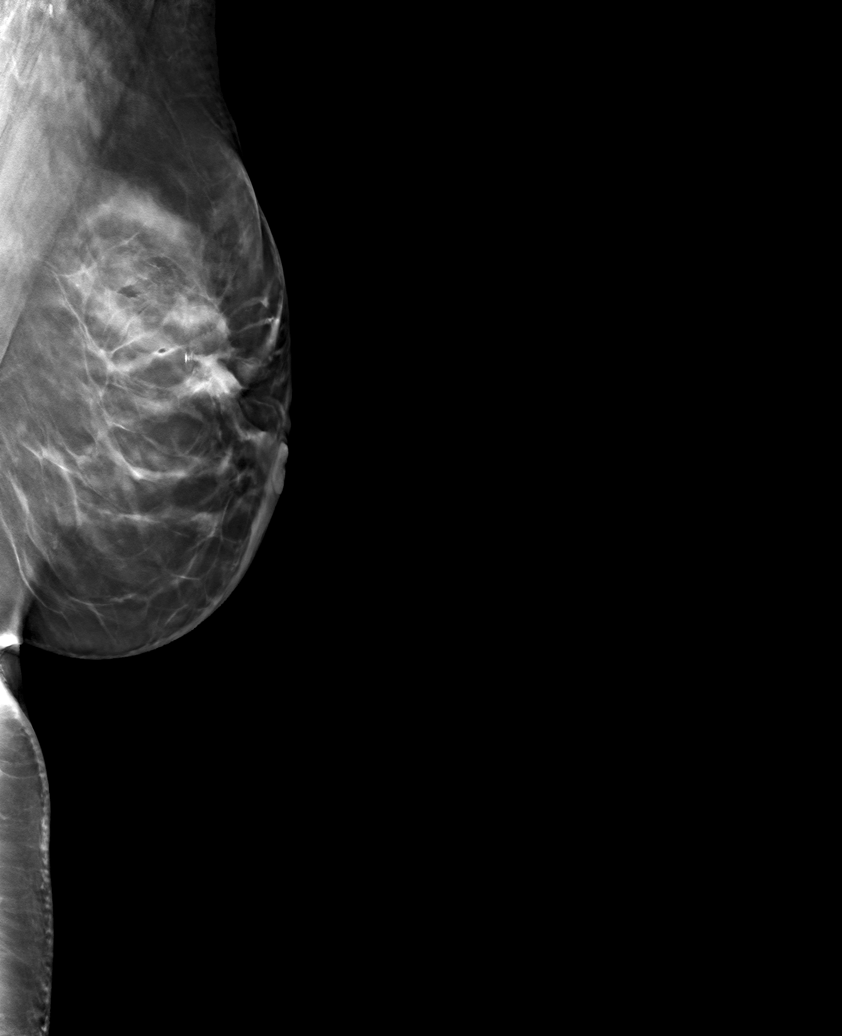

[L CC tomo · tomo slice 45/88.0]
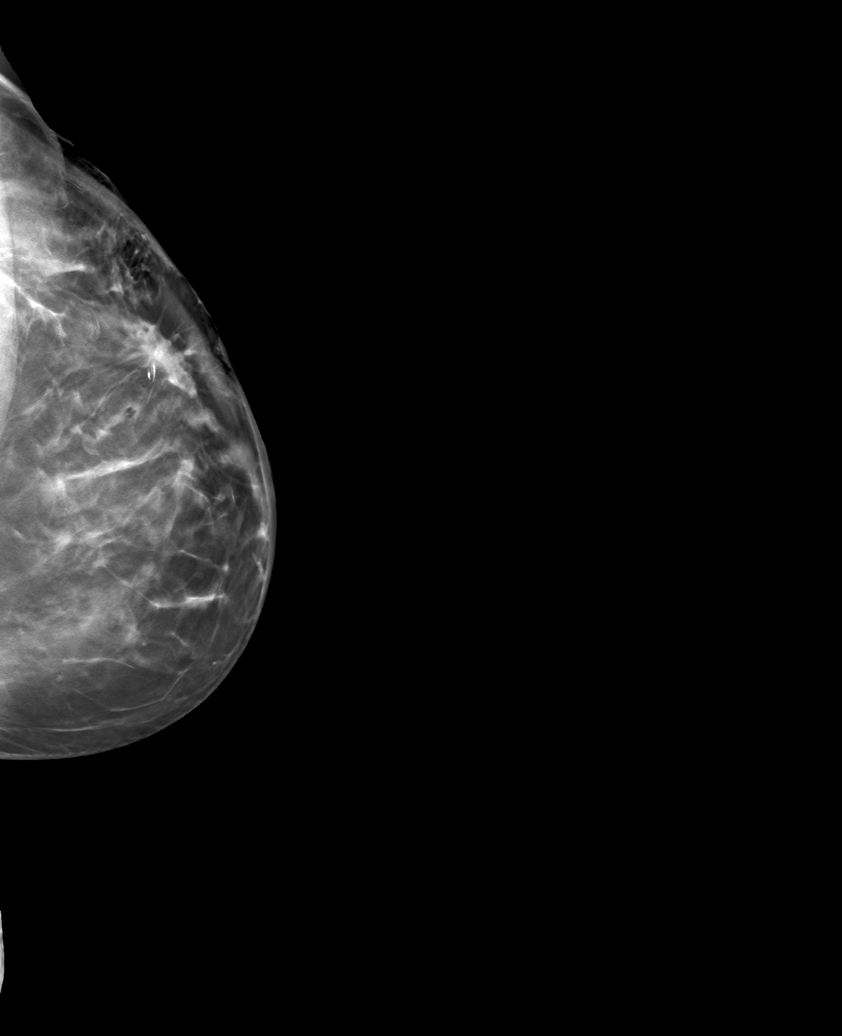

[L MLO tomo · tomo slice 44/87.0]
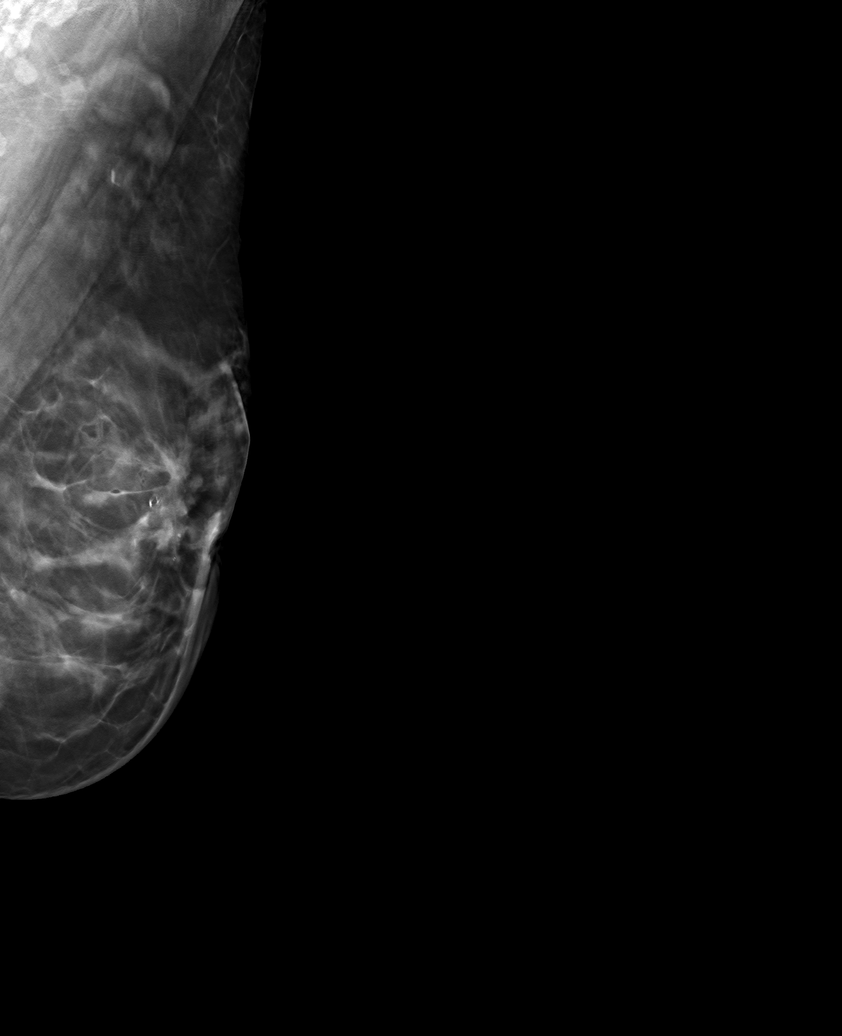

[6 of 18 positions shown; findings below may reference images not displayed]

FINDINGS: Site 1: Ulcerating LEFT breast mass.

Mammographic images were obtained following ultrasound guided biopsy
of a irregular LEFT outer breast mass. The Q biopsy marking clip is
in expected position at the site of biopsy.

Site 2: LEFT axillary lymph node

Mammographic images were obtained following ultrasound guided biopsy
of a LEFT axillary lymph node without a fatty hila. The HYDROMARK
biopsy marking clip is in expected position at the site of biopsy.
IMPRESSION: 1. Appropriate positioning of the Q shaped biopsy marking clip at
the site of biopsy in the outer breast.

2. Appropriate positioning of the HYDROMARK shaped biopsy marking
clip at the site of biopsy in the axilla.

Final Assessment: Post Procedure Mammograms for Marker Placement

## 2021-12-12 MED ORDER — GOSERELIN ACETATE 3.6 MG ~~LOC~~ IMPL
3.6000 mg | DRUG_IMPLANT | Freq: Once | SUBCUTANEOUS | Status: AC
  Administered 2021-12-12: 3.6 mg via SUBCUTANEOUS
  Filled 2021-12-12: qty 3.6

## 2021-12-12 NOTE — Progress Notes (Signed)
° °  12/13/21  CC:  Chief Complaint  Patient presents with   Cysto     HPI: Tricia Berry is a 51 y.o.female with a personal history of rUTIs, microscopic hematuria, and smoking, who presents today for cystoscopy   She had a ct of abdomen and pelvis in 06/2021  that showed kidneys enhance normally and symmetrically. No urinary tract calculi or obstructive uropathy within either kidney. The adrenals and bladder are unremarkable  Review of her medical record indicates that she has had a UTI almost every month for the past year, alternate between E. coli and Enterococcus.  She does have some resistance to Cipro and ampicillin on some of the cultures.  Her preference is to avoid Cipro because of the black box warning.  She is currently on suppressive trimethoprim.   She has a personal history of breast cancer and currently on Arimidex.  CT urogram on 12/09/2021 visualized no hydronephrosis.  No renal, ureteral or bladder calculi. No solid enhancing renal mass.  She reports urgency with no burning.    Urinalysis today shows 11-30 red blood cells but otherwise unremarkable.  Vitals:   12/13/21 1057  BP: 118/70  Pulse: 89  NED. A&Ox3.   No respiratory distress   Abd soft, NT, ND Normal external genitalia with patent urethral meatus  Cystoscopy Procedure Note  Patient identification was confirmed, informed consent was obtained, and patient was prepped using Betadine solution.  Lidocaine jelly was administered per urethral meatus.    Diffuse vaginal and periurethral atrophy appreciated.  Procedure: - Flexible cystoscope introduced, without any difficulty.   - Thorough search of the bladder revealed:    normal urethral meatus with very slight stenosis externally    normal urothelium    no stones    no ulcers     no tumors    no urethral polyps    no trabeculation  - Ureteral orifices were normal in position and appearance.  Post-Procedure: - Patient tolerated the  procedure well  Assessment/ Plan:   1. Microscopic hematuria -  Cystoscopy today showed unremarkable  -CT urogram personally reviewed, no obvious etiology microscopic blood in the urine   2. Recurrent UTI   - She was given a 1 time dose of Keflex - Continue trimethoprim daily   - We discussed okay to resume d-mannose, cranberry tablets, and continue probiotics as prevention  3. Urinary urgency  - Discusses addition of either  gemtesa and myrbetriq for urinary symptoms as she has issues with dryness from anticholinergic medications.  She would like to research further on these medications. -She was given samples of Gemtesa 75 mg x 1 month let us know if she like to pursue this.  She will also check with her insurance company to see if either of these medications on formulary.  4. Vaginal urethral dryness  - Recommend she go back to using replens she is not a candidate   for vaginal estrogen cream due to her breast cancer.   F/u 1 year with UA or sooner as needed  I,Kailey Littlejohn,acting as a scribe for Hollice Espy, MD.,have documented all relevant documentation on the behalf of Hollice Espy, MD,as directed by  Hollice Espy, MD while in the presence of Hollice Espy, MD.  I have reviewed the above documentation for accuracy and completeness, and I agree with the above.   Hollice Espy, MD

## 2021-12-12 NOTE — Progress Notes (Signed)
Mychart message sent.

## 2021-12-13 ENCOUNTER — Ambulatory Visit (INDEPENDENT_AMBULATORY_CARE_PROVIDER_SITE_OTHER): Payer: No Typology Code available for payment source | Admitting: Urology

## 2021-12-13 ENCOUNTER — Encounter: Payer: Self-pay | Admitting: Urology

## 2021-12-13 ENCOUNTER — Encounter: Payer: No Typology Code available for payment source | Admitting: Physical Therapy

## 2021-12-13 VITALS — BP 118/70 | HR 89 | Ht 60.0 in | Wt 113.0 lb

## 2021-12-13 DIAGNOSIS — R3129 Other microscopic hematuria: Secondary | ICD-10-CM | POA: Diagnosis not present

## 2021-12-13 DIAGNOSIS — R300 Dysuria: Secondary | ICD-10-CM

## 2021-12-13 MED ORDER — CEPHALEXIN 250 MG PO CAPS
500.0000 mg | ORAL_CAPSULE | Freq: Once | ORAL | Status: AC
  Administered 2021-12-13: 500 mg via ORAL

## 2021-12-13 NOTE — Patient Instructions (Signed)
Gemtesa 75 mg  Myrbetriq 50 mg

## 2021-12-14 ENCOUNTER — Ambulatory Visit: Payer: No Typology Code available for payment source | Admitting: Physical Therapy

## 2021-12-14 ENCOUNTER — Other Ambulatory Visit: Payer: Self-pay

## 2021-12-14 ENCOUNTER — Other Ambulatory Visit: Payer: Self-pay | Admitting: Family

## 2021-12-14 ENCOUNTER — Encounter: Payer: Self-pay | Admitting: Physical Therapy

## 2021-12-14 DIAGNOSIS — M25612 Stiffness of left shoulder, not elsewhere classified: Secondary | ICD-10-CM | POA: Diagnosis not present

## 2021-12-14 DIAGNOSIS — C50912 Malignant neoplasm of unspecified site of left female breast: Secondary | ICD-10-CM

## 2021-12-14 DIAGNOSIS — L599 Disorder of the skin and subcutaneous tissue related to radiation, unspecified: Secondary | ICD-10-CM

## 2021-12-14 DIAGNOSIS — Z483 Aftercare following surgery for neoplasm: Secondary | ICD-10-CM

## 2021-12-14 DIAGNOSIS — I700 Atherosclerosis of aorta: Secondary | ICD-10-CM

## 2021-12-14 DIAGNOSIS — R293 Abnormal posture: Secondary | ICD-10-CM

## 2021-12-14 DIAGNOSIS — Z170 Estrogen receptor positive status [ER+]: Secondary | ICD-10-CM

## 2021-12-14 LAB — MICROSCOPIC EXAMINATION: Bacteria, UA: NONE SEEN

## 2021-12-14 LAB — URINALYSIS, COMPLETE
Bilirubin, UA: NEGATIVE
Glucose, UA: NEGATIVE
Ketones, UA: NEGATIVE
Leukocytes,UA: NEGATIVE
Nitrite, UA: NEGATIVE
Protein,UA: NEGATIVE
Specific Gravity, UA: 1.01 (ref 1.005–1.030)
Urobilinogen, Ur: 0.2 mg/dL (ref 0.2–1.0)
pH, UA: 7 (ref 5.0–7.5)

## 2021-12-14 NOTE — Therapy (Signed)
[facility] @ [place], [place], [address] Phone: [phone]   Fax:  [phone]  Physical Therapy Treatment  Patient Details  Name: Tricia Berry MRN: [id number] Date of Birth: 04-12-1971 Referring Provider (PT): Burnard Hawthorne, FNP   Encounter Date: 12/14/2021   PT End of Session - 12/14/21 1054     Visit Number 20    Number of Visits 22    Date for PT Re-Evaluation 12/29/21    PT Start Time 1003    PT Stop Time 1053    PT Time Calculation (min) 50 min    Activity Tolerance Patient tolerated treatment well    Behavior During Therapy [facility] for tasks assessed/performed             Past Medical History:  Diagnosis Date   Anxiety    Breast cancer ([place])    Diverticulitis    Family history of ovarian cancer    GERD (gastroesophageal reflux disease)    IBS (irritable bowel syndrome)    PONV (postoperative nausea and vomiting)    severe migraine and vomiting post anesthesia   Scoliosis     Past Surgical History:  Procedure Laterality Date   ABDOMINAL HYSTERECTOMY     still has ovaries, no gyn cancer, hysterectomy due to endometriosis. NO cervix on exam 01/10/21   BREAST BIOPSY Left 09/15/2020   [place] bx of mass, path pending, Q marker   BREAST BIOPSY Left 09/15/2020   [place] bx of LN, hydromarker, path pending   BREAST RECONSTRUCTION WITH PLACEMENT OF TISSUE EXPANDER AND FLEX HD (ACELLULAR HYDRATED DERMIS) Bilateral 03/17/2021   Procedure: IMMEDIATE BILATERAL BREAST RECONSTRUCTION WITH PLACEMENT OF TISSUE EXPANDER AND FLEX HD (ACELLULAR HYDRATED DERMIS);  Surgeon: Wallace Going, DO;  Location: [place];  Service: Plastics;  Laterality: Bilateral;   COLONOSCOPY WITH PROPOFOL N/A 11/21/2021   Procedure: COLONOSCOPY WITH PROPOFOL;  Surgeon: Lin Landsman, MD;  Location: [facility] ENDOSCOPY;  Service: Gastroenterology;  Laterality: N/A;   IR IMAGING GUIDED PORT INSERTION  10/01/2020    MODIFIED MASTECTOMY Left 03/17/2021   Procedure: LEFT MODIFIED RADICAL MASTECTOMY;  Surgeon: Erroll Luna, MD;  Location: [place];  Service: General;  Laterality: Left;   PORTA CATH REMOVAL Right 03/17/2021   Procedure: PORTA CATH REMOVAL;  Surgeon: Erroll Luna, MD;  Location: [place];  Service: General;  Laterality: Right;   REMOVAL OF BILATERAL TISSUE EXPANDERS WITH PLACEMENT OF BILATERAL BREAST IMPLANTS Bilateral 05/09/2021   Procedure: REMOVAL OF BILATERAL TISSUE EXPANDERS WITH PLACEMENT OF BILATERAL BREAST IMPLANTS;  Surgeon: Wallace Going, DO;  Location: [place];  Service: Plastics;  Laterality: Bilateral;  90 min   TOTAL MASTECTOMY Right 03/17/2021   Procedure: TOTAL MASTECTOMY;  Surgeon: Erroll Luna, MD;  Location: [place];  Service: General;  Laterality: Right;    There were no vitals filed for this visit.   Subjective Assessment - 12/14/21 1101     Subjective I brought all of my exercises for you to look at.    Pertinent History L breast cancer ER+/PR+ HER2-, stage III, underwent a bilateral mastectomy and ALND on L (7/16) on 03/17/21, reconstruction in summer 2022, neuropathy of hands, lower legs and feet, completed radiation, completed radiation Dec17, 2021 to February 18, 2021    Patient Stated Goals to gain info from providers    Currently in Pain? --   pt did not rate today  [facility] Adult PT Treatment/Exercise - 12/14/21 0001       Exercises   Exercises Other Exercises    Other Exercises  Went through all of pts current home exercises and consolidated them. Removed more basic exercises and kept more advanced versions that pt is in need of now. Also combined exercises from cancer rehab with exercises from the PT working with her back pain. Replaced several After Breast Cancer exercises with similar exercises from the back pain PT. Instructed pt  today in the following in her Strength ABC program: single leg bridge x 10 reps each, TA bracing with leg x 10 reps each and 5 sec holds, reverse curls x 10 with 5 sec holds, bridge with hip abd using red band x 10 reps, hip extension in standing with red band x 10 reps bilat, removed dead lift to protect back, standing tricep ext with 1 lb weight x 10 reps                          PT Long Term Goals - 12/01/21 1157       PT LONG TERM GOAL #1   Title Pt will return to baseline shoulder ROM measurements and not demonstrate any signs or symptoms of lymphedema.    Baseline 05/31/21- pt has 180 degrees of flex and abd bilaterally    Time 4    Period Weeks    Status Achieved      PT LONG TERM GOAL #2   Title Pt will demonstrate 165 degrees of bilateral flexion to allow pt to reach overhead.    Baseline R 143 L 101; 05/02/21- 172 on R, 175 on L    Time 4    Period Weeks    Status Achieved      PT LONG TERM GOAL #3   Title Pt will demonstrate 165 degrees of bilateral shoulder abduction to allow her to reach out the side    Baseline R 152 L 78; 05/02/21- R 180 L 179    Time 4    Period Weeks    Status Achieved      PT LONG TERM GOAL #4   Title Pt will report she is no longer having muscle spasms across her chest to allow improved comfort    Baseline 05/02/21- pt reports she is no longer having muscle spasms    Time 4    Period Weeks    Status Achieved      PT LONG TERM GOAL #5   Title Pt will be independent in a home exercise program for long term strengthening and stretching.    Baseline 12/01/21- will instruct pt in Strength ABC next week    Time 4    Period Weeks    Status On-going      PT LONG TERM GOAL #6   Title Pt will report a 50% improvement in edema in L trunk and chest to allow improved comfort.    Baseline 05/02/21- 20% improved; 05/31/21 no edema present    Time 4    Period Weeks    Status Achieved      PT LONG TERM GOAL #7   Title Pt will be independent  in Strength ABC program for long term stretching and strengthening after completion of radiation.    Time 4    Period Weeks    Status On-going      PT LONG TERM GOAL #8   Title Pt will demonstrate baseline  ROM (180 degrees) of flexion and abduction of LUE to allow pt to return to prior level of function.    Baseline 160 flex, 164 abd; 09/29/21- flex- 160, abd - 156 both with pain from cording in axilla    Time 4    Period Weeks    Status Achieved                   Plan - 12/14/21 1056     Clinical Impression Statement Pt returns to PT and brought all of her current home execises. She is hoping to consolidate her exercises to help improve her compliance. Several exercises were taken out of strength after breast cancer program and replaced with similar exercises given to her by her PT helping her with her back pain. Educated pt on importance of doing her back extension exercises numerous times daily. Removed post surgical stretches and stretches with dowel and educated pt to continue supine scapular strengthening series. Continued with instruction in Strength ABC program today including instructing pt in replaced exercises with emphasis on engaging core muscles.    PT Frequency 1x / week    PT Duration 4 weeks    PT Treatment/Interventions ADLs/Self Care Home Management;Therapeutic exercise;Patient/family education;Scar mobilization;Passive range of motion;Manual techniques;Manual lymph drainage;Compression bandaging;Taping;Vasopneumatic Device    PT Next Visit Plan cont to instruct in Strength ABC program; Cont every 3 month L-Dex screens for up to 2 years from her SLNB    PT Home Exercise Plan post op breast exercises, instructed pt in supine dowel, supine scap series, windshield wiper yoga ex, bow and arrow yoga ex, strength ABC    Consulted and Agree with Plan of Care Patient             Patient will benefit from skilled therapeutic intervention in order to improve the  following deficits and impairments:  Pain, Postural dysfunction, Decreased knowledge of precautions, Impaired UE functional use, Increased fascial restricitons, Decreased strength, Decreased range of motion, Decreased scar mobility, Increased edema, Increased muscle spasms  Visit Diagnosis: Stiffness of left shoulder, not elsewhere classified  Disorder of the skin and subcutaneous tissue related to radiation, unspecified  Aftercare following surgery for neoplasm  Abnormal posture  Malignant 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, estrogen receptor positive, unspecified site of breast [facility])     Problem List Patient Active Problem List   Diagnosis Date Noted   Colon cancer screening    Family history of heart disease 11/16/2021   Acquired absence of breast 08/16/2021   Dysuria 08/05/2021   B12 deficiency 07/21/2021   Atherosclerosis of aorta ([place]) 07/20/2021   Hepatic steatosis 07/20/2021   Abdominal pain 07/18/2021   Breast cancer ([place]) 03/17/2021   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[place]) 03/14/2021   Genetic testing 11/22/2020   Family history of ovarian cancer    Candidal vulvovaginitis 11/01/2020   Low back pain 10/06/2020   Encounter for medical examination to establish care 09/26/2020   Malignant neoplasm of upper-outer quadrant of left breast in female, estrogen receptor positive ([place]) 09/26/2020   BACK STRAIN, LUMBAR 03/08/2010   INTERNAL HEMORRHOIDS 01/27/2010   ANAL FISSURE 01/27/2010   OSTEOARTHRITIS, HANDS, BILATERAL 01/04/2010   ARTHRALGIA 10/05/2009   INSOMNIA, CHRONIC 05/06/2009   ALLERGIC RHINITIS, SEASONAL 02/09/2009   TOBACCO ABUSE 12/29/2008   IBS 10/13/2008   Anxiety state 08/25/2008   DEPRESSION, RECURRENT 08/25/2008    Allyson Sabal Parkman, PT 12/14/2021, 11:02 AM  [facility] @ [place] [address]  [place], [place], [address] Phone: [phone]   Fax:  [phone]  Name: Tricia Berry MRN:  [id number] Date of Birth: 28-Sep-1971

## 2021-12-15 ENCOUNTER — Ambulatory Visit (INDEPENDENT_AMBULATORY_CARE_PROVIDER_SITE_OTHER): Payer: No Typology Code available for payment source | Admitting: Cardiology

## 2021-12-15 ENCOUNTER — Encounter: Payer: Self-pay | Admitting: Oncology

## 2021-12-15 ENCOUNTER — Other Ambulatory Visit: Payer: Self-pay

## 2021-12-15 ENCOUNTER — Encounter: Payer: Self-pay | Admitting: Urology

## 2021-12-15 ENCOUNTER — Encounter: Payer: Self-pay | Admitting: Cardiology

## 2021-12-15 DIAGNOSIS — F17210 Nicotine dependence, cigarettes, uncomplicated: Secondary | ICD-10-CM

## 2021-12-15 DIAGNOSIS — R0609 Other forms of dyspnea: Secondary | ICD-10-CM

## 2021-12-15 DIAGNOSIS — E782 Mixed hyperlipidemia: Secondary | ICD-10-CM

## 2021-12-15 DIAGNOSIS — F17200 Nicotine dependence, unspecified, uncomplicated: Secondary | ICD-10-CM

## 2021-12-15 MED ORDER — METOPROLOL TARTRATE 100 MG PO TABS
100.0000 mg | ORAL_TABLET | Freq: Once | ORAL | 0 refills | Status: DC
Start: 2021-12-15 — End: 2021-12-23
  Filled 2021-12-15: qty 1, 1d supply, fill #0

## 2021-12-15 NOTE — Progress Notes (Signed)
Cardiology Office Note:    Date:  12/15/2021   ID:  WANNA GULLY, DOB [birth year], MRN [id number]  PCP:  Burnard Hawthorne, FNP   [facility] Providers Cardiologist:  Kate Sable, MD     Referring MD: Burnard Hawthorne, FNP   Chief Complaint  Patient presents with   New Patient (Initial Visit)    Referred by PCP for Family hx. Meds reviewed verbally with patient.    Tricia Berry is a 51 y.o. female who is being seen today for the evaluation of abnormal ECG.  At the request of Tricia Schwalbe Yvetta Coder, FNP.   History of Present Illness:    Tricia Berry is a 51 y.o. female with a hx of hyperlipidemia, current smoker x20+ years, breast cancer on chemo, who presents due to dyspnea and abnormal EKG.  Patient had an ECG 11/11/2021 showing inverted T waves in V1 V2.  She was advised to follow-up with cardiology.  She denies chest pain but endorses shortness of breath with exertion.  She is unsure if this is secondary to deconditioning but would like to get checked out since her dad had a massive MI in his 25s.  Was started on Crestor 3 months ago for hyperlipidemia.  Repeat lipid panel is planned for next week with PCP.  Echocardiogram 10/2021 EF 87-56, diastolic function normal no valvular abnormalities.  Past Medical History:  Diagnosis Date   Anxiety    Breast cancer ([place])    Diverticulitis    Family history of ovarian cancer    GERD (gastroesophageal reflux disease)    IBS (irritable bowel syndrome)    PONV (postoperative nausea and vomiting)    severe migraine and vomiting post anesthesia   Scoliosis     Past Surgical History:  Procedure Laterality Date   ABDOMINAL HYSTERECTOMY     still has ovaries, no gyn cancer, hysterectomy due to endometriosis. NO cervix on exam 01/10/21   BREAST BIOPSY Left 09/15/2020   [place] bx of mass, path pending, Q marker   BREAST BIOPSY Left 09/15/2020   [place] bx of LN, hydromarker, path pending   BREAST RECONSTRUCTION WITH  PLACEMENT OF TISSUE EXPANDER AND FLEX HD (ACELLULAR HYDRATED DERMIS) Bilateral 03/17/2021   Procedure: IMMEDIATE BILATERAL BREAST RECONSTRUCTION WITH PLACEMENT OF TISSUE EXPANDER AND FLEX HD (ACELLULAR HYDRATED DERMIS);  Surgeon: Wallace Going, DO;  Location: [place];  Service: Plastics;  Laterality: Bilateral;   COLONOSCOPY WITH PROPOFOL N/A 11/21/2021   Procedure: COLONOSCOPY WITH PROPOFOL;  Surgeon: Lin Landsman, MD;  Location: [facility] ENDOSCOPY;  Service: Gastroenterology;  Laterality: N/A;   IR IMAGING GUIDED PORT INSERTION  10/01/2020   MODIFIED MASTECTOMY Left 03/17/2021   Procedure: LEFT MODIFIED RADICAL MASTECTOMY;  Surgeon: Erroll Luna, MD;  Location: [place];  Service: General;  Laterality: Left;   PORTA CATH REMOVAL Right 03/17/2021   Procedure: PORTA CATH REMOVAL;  Surgeon: Erroll Luna, MD;  Location: [place];  Service: General;  Laterality: Right;   REMOVAL OF BILATERAL TISSUE EXPANDERS WITH PLACEMENT OF BILATERAL BREAST IMPLANTS Bilateral 05/09/2021   Procedure: REMOVAL OF BILATERAL TISSUE EXPANDERS WITH PLACEMENT OF BILATERAL BREAST IMPLANTS;  Surgeon: Wallace Going, DO;  Location: [place];  Service: Plastics;  Laterality: Bilateral;  90 min   TOTAL MASTECTOMY Right 03/17/2021   Procedure: TOTAL MASTECTOMY;  Surgeon: Erroll Luna, MD;  Location: [place];  Service: General;  Laterality: Right;    Current Medications: Current Meds  Medication Sig   anastrozole (ARIMIDEX) 1 MG tablet Take 1 tablet (1 mg total) by mouth daily.   cyanocobalamin (,VITAMIN B-12,) 1000 MCG/ML injection 1000 mcg (1 mL) intramuscular injection in the thigh ( vastus lateralis) once per month.   FLUoxetine (PROZAC) 20 MG capsule Take 1 capsule (20 mg total) by mouth every morning.   Lactobacillus (PROBIOTIC ACIDOPHILUS PO) Take 1 capsule by mouth daily.   lidocaine-prilocaine (EMLA) cream Apply 1  application topically as needed.   LORazepam (ATIVAN) 0.5 MG tablet Take 1 tablet (0.5 mg total) by mouth at bedtime as needed for up to 30 doses for anxiety.   metoprolol tartrate (LOPRESSOR) 100 MG tablet Take 1 tablet (100 mg total) by mouth once for 1 dose. Take 2 hours prior to your CT scan.   Multiple Vitamins-Minerals (MULTIVITAL PO) Take 1 Dose by mouth daily.   nicotine (NICODERM CQ - DOSED IN MG/24 HOURS) 21 mg/24hr patch use 1 patch daily   nicotine (NICOTROL) 10 MG inhaler FOLLOW INSTRUCTIONS ON PACKAGE AS NEEDED FOR SMOKING CESSATION   nicotine polacrilex (SM NICOTINE) 2 MG lozenge Take by mouth.   ondansetron (ZOFRAN) 8 MG tablet Take 1 tablet (8 mg total) by mouth every 8 (eight) hours as needed for nausea or vomiting.   Oxycodone HCl 10 MG TABS Take 1 tablet (10 mg total) by mouth every 8 (eight) hours as needed.   pregabalin (LYRICA) 100 MG capsule Take 1 capsule (100 mg total) by mouth 2 (two) times daily.   rosuvastatin (CRESTOR) 5 MG tablet Take 1 tablet (5 mg total) by mouth every evening.   tiZANidine (ZANAFLEX) 4 MG tablet Take 1 tablet (4 mg total) by mouth 3 (three) times daily.   traMADol (ULTRAM) 50 MG tablet Take 1 tablet (50 mg total) by mouth every 12 (twelve) hours as needed for up to 5 days.   traZODone (DESYREL) 50 MG tablet Take 0.5-1 tablets (25-50 mg total) by mouth at bedtime as needed for sleep.   trimethoprim (TRIMPEX) 100 MG tablet Take 1 tablet (100 mg total) by mouth daily.     Allergies:   Morphine, Sertraline hcl, Sulfa antibiotics, Sulfamethoxazole, and Sulfonamide derivatives   Social History   Socioeconomic History   Marital status: Divorced    Spouse name: Not on file   Number of children: 2   Years of education: Not on file   Highest education level: Not on file  Occupational History   Occupation: Nurse    Employer: [employer]  Tobacco Use   Smoking status: Every Day    Packs/day: 0.25    Types: Cigarettes   Smokeless tobacco: Never    Tobacco comments:    Pt trying to quit now. Has reduced amount significantly  Vaping Use   Vaping Use: Never used  Substance and Sexual Activity   Alcohol use: Not Currently   Drug use: No   Sexual activity: Not Currently    Birth control/protection: None  Other Topics Concern   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 She and her spouse are primary caregivers.    Social Determinants of Health   Financial Resource Strain: Not on file  Food Insecurity: Not on file  Transportation Needs: Not on file  Physical Activity: Not on file  Stress: Not on file  Social Connections: Not on file     Family History: The patient's family  history includes Diverticulitis in her brother and mother; Gout in her father; Heart attack (age of onset: 17) in her father; Heart failure in her father; Hypertension in her father and mother; Osteoarthritis in her mother; Ovarian cancer in her paternal grandmother.  ROS:   Please see the history of present illness.     All other systems reviewed and are negative.  EKGs/Labs/Other Studies Reviewed:    The following studies were reviewed today:   EKG:  EKG is  ordered today.  The ekg ordered today demonstrates normal sinus rhythm, septal T wave inversions.  Recent Labs: 01/21/2021: TSH 1.44 11/25/2021: ALT 19; BUN 8; Creatinine, Ser 0.83; Hemoglobin 13.7; Platelets 194; Potassium 3.9; Sodium 137  Recent Lipid Panel    Component Value Date/Time   CHOL 248 (H) 08/05/2021 1239   TRIG 345.0 (H) 08/05/2021 1239   HDL 37.80 (L) 08/05/2021 1239   CHOLHDL 7 08/05/2021 1239   VLDL 69.0 (H) 08/05/2021 1239   LDLCALC 116 (H) 12/29/2008 2156   LDLDIRECT 163.0 08/05/2021 1239     Risk Assessment/Calculations:          Physical Exam:    VS:  BP 120/70 (BP Location: Right Arm, Patient Position: Sitting, Cuff Size: Normal)    Pulse 67    Ht 5' (1.524 m)    Wt 115 lb  (52.2 kg)    SpO2 98%    BMI 22.46 kg/m     Wt Readings from Last 3 Encounters:  12/15/21 115 lb (52.2 kg)  12/13/21 113 lb (51.3 kg)  12/07/21 113 lb 6.4 oz (51.4 kg)     GEN:  Well nourished, well developed in no acute distress HEENT: Normal NECK: No JVD; No carotid bruits LYMPHATICS: No lymphadenopathy CARDIAC: RRR, no murmurs, rubs, gallops RESPIRATORY:  Clear to auscultation without rales, wheezing or rhonchi  ABDOMEN: Soft, non-tender, non-distended MUSCULOSKELETAL:  No edema; No deformity  SKIN: Warm and dry NEUROLOGIC:  Alert and oriented x 3 PSYCHIATRIC:  Normal affect   ASSESSMENT:    1. Dyspnea on exertion   2. Mixed hyperlipidemia   3. Smoking    PLAN:    In order of problems listed above:  Dyspnea on exertion, risk factors hyperlipidemia, current smoker.  This could be an anginal equivalent.  Obtain coronary CTA to evaluate presence of CAD.  Echo 10/2021 showed normal systolic and diastolic function, EF 55 to 60%. Mixed hyperlipidemia, continue Crestor 5 mg daily.  Repeat fasting lipid profile as scheduled next week.  Titrate statin as needed for adequate lipid control. Current smoker, cessation advised.  Follow-up after coronary CTA.       Medication Adjustments/Labs and Tests Ordered: Current medicines are reviewed at length with the patient today.  Concerns regarding medicines are outlined above.  Orders Placed This Encounter  Procedures   CT CORONARY MORPH W/CTA COR W/SCORE W/CA W/CM &/OR WO/CM   EKG 12-Lead   Meds ordered this encounter  Medications   metoprolol tartrate (LOPRESSOR) 100 MG tablet    Sig: Take 1 tablet (100 mg total) by mouth once for 1 dose. Take 2 hours prior to your CT scan.    Dispense:  1 tablet    Refill:  0    Patient Instructions  Medication Instructions:  Your physician recommends that you continue on your current medications as directed. Please refer to the Current Medication list given to you today.  *If you need a  refill on your cardiac medications before your next appointment, please call your  pharmacy*   Lab Work: None ordered If you have labs (blood work) drawn today and your tests are completely normal, you will receive your results only by: [place] (if you have MyChart) OR A paper copy in the mail If you have any lab test that is abnormal or we need to change your treatment, we will call you to review the results.   Testing/Procedures:  Your physician has requested that you have cardiac CT. Cardiac computed tomography (CT) is a painless test that uses an x-ray machine to take clear, detailed pictures of your heart.   Your cardiac CT will be scheduled at:  [facility] [address] [place], [place] [address] [phone]   Thursday 12/22/21 at 12:30  Please arrive 15 mins early for check-in and test prep.    Please follow these instructions carefully (unless otherwise directed):    On the Night Before the Test: Be sure to Drink plenty of water. Do not consume any caffeinated/decaffeinated beverages or chocolate 12 hours prior to your test.   On the Day of the Test: Drink plenty of water until 1 hour prior to the test. Do not eat any food 4 hours prior to the test. You may take your regular medications prior to the test.  Take metoprolol (Lopressor) two hours prior to test. FEMALES- please wear underwire-free bra if available, avoid dresses & tight clothing       After the Test: Drink plenty of water. After receiving IV contrast, you may experience a mild flushed feeling. This is normal. On occasion, you may experience a mild rash up to 24 hours after the test. This is not dangerous. If this occurs, you can take Benadryl 25 mg and increase your fluid intake. If you experience trouble breathing, this can be serious. If it is severe call 911 IMMEDIATELY. If it is mild, please call our office. If you take any of these  medications: Glipizide/Metformin, Avandament, Glucavance, please do not take 48 hours after completing test unless otherwise instructed.  Please allow 2-4 weeks for scheduling of routine cardiac CTs. Some insurance companies require a pre-authorization which may delay scheduling of this test.   For non-scheduling related questions, please contact the cardiac imaging nurse navigator should you have any questions/concerns: Marchia Bond, Cardiac Imaging Nurse Navigator Gordy Clement, Cardiac Imaging Nurse Navigator [facility] Direct Office Dial: [phone]   For scheduling needs, including cancellations and rescheduling, please call [place], [phone].      Follow-Up: At [facility], you and your health needs are our priority.  As part of our continuing mission to provide you with exceptional heart care, we have created designated Provider Care Teams.  These Care Teams include your primary Cardiologist (physician) and Advanced Practice Providers (APPs -  Physician Assistants and Nurse Practitioners) who all work together to provide you with the care you need, when you need it.  We recommend signing up for the patient portal called "MyChart".  Sign up information is provided on this After Visit Summary.  MyChart is used to connect with patients for Virtual Visits (Telemedicine).  Patients are able to view lab/test results, encounter notes, upcoming appointments, etc.  Non-urgent messages can be sent to your provider as well.   To learn more about what you can do with MyChart, go to [email or web address].    Your next appointment:   Follow up scheduled for 3/6 at 3:40  The format for your next appointment:   In  Person  Provider:   You may see Kate Sable, MD or one of the following Advanced Practice Providers on your designated Care Team:   Murray Hodgkins, NP Christell Faith, PA-C Cadence Kathlen Mody, [place]    Other Instructions     Signed, Kate Sable, MD  12/15/2021 10:52 AM    [place]

## 2021-12-15 NOTE — Patient Instructions (Addendum)
Medication Instructions:  Your physician recommends that you continue on your current medications as directed. Please refer to the Current Medication list given to you today.  *If you need a refill on your cardiac medications before your next appointment, please call your pharmacy*   Lab Work: None ordered If you have labs (blood work) drawn today and your tests are completely normal, you will receive your results only by: [place] (if you have MyChart) OR A paper copy in the mail If you have any lab test that is abnormal or we need to change your treatment, we will call you to review the results.   Testing/Procedures:  Your physician has requested that you have cardiac CT. Cardiac computed tomography (CT) is a painless test that uses an x-ray machine to take clear, detailed pictures of your heart.   Your cardiac CT will be scheduled at:  [facility] [address] [place], [place] [address] [phone]   Thursday 12/22/21 at 12:30  Please arrive 15 mins early for check-in and test prep.    Please follow these instructions carefully (unless otherwise directed):    On the Night Before the Test: Be sure to Drink plenty of water. Do not consume any caffeinated/decaffeinated beverages or chocolate 12 hours prior to your test.   On the Day of the Test: Drink plenty of water until 1 hour prior to the test. Do not eat any food 4 hours prior to the test. You may take your regular medications prior to the test.  Take metoprolol (Lopressor) two hours prior to test. FEMALES- please wear underwire-free bra if available, avoid dresses & tight clothing       After the Test: Drink plenty of water. After receiving IV contrast, you may experience a mild flushed feeling. This is normal. On occasion, you may experience a mild rash up to 24 hours after the test. This is not dangerous. If this occurs, you can take Benadryl 25 mg and  increase your fluid intake. If you experience trouble breathing, this can be serious. If it is severe call 911 IMMEDIATELY. If it is mild, please call our office. If you take any of these medications: Glipizide/Metformin, Avandament, Glucavance, please do not take 48 hours after completing test unless otherwise instructed.  Please allow 2-4 weeks for scheduling of routine cardiac CTs. Some insurance companies require a pre-authorization which may delay scheduling of this test.   For non-scheduling related questions, please contact the cardiac imaging nurse navigator should you have any questions/concerns: Marchia Bond, Cardiac Imaging Nurse Navigator Gordy Clement, Cardiac Imaging Nurse Navigator [facility] Direct Office Dial: [phone]   For scheduling needs, including cancellations and rescheduling, please call [place], [phone].      Follow-Up: At [facility], you and your health needs are our priority.  As part of our continuing mission to provide you with exceptional heart care, we have created designated Provider Care Teams.  These Care Teams include your primary Cardiologist (physician) and Advanced Practice Providers (APPs -  Physician Assistants and Nurse Practitioners) who all work together to provide you with the care you need, when you need it.  We recommend signing up for the patient portal called "MyChart".  Sign up information is provided on this After Visit Summary.  MyChart is used to connect with patients for Virtual Visits (Telemedicine).  Patients are able to view lab/test results, encounter notes, upcoming appointments, etc.  Non-urgent messages can be sent to your  provider as well.   To learn more about what you can do with MyChart, go to [email or web address].    Your next appointment:   Follow up scheduled for 3/6 at 3:40  The format for your next appointment:   In Person  Provider:   You may see Kate Sable, MD or  one of the following Advanced Practice Providers on your designated Care Team:   Murray Hodgkins, NP Christell Faith, PA-C Cadence Kathlen Mody, [place]    Other Instructions

## 2021-12-16 ENCOUNTER — Encounter: Payer: Self-pay | Admitting: Family

## 2021-12-16 ENCOUNTER — Other Ambulatory Visit: Payer: Self-pay

## 2021-12-16 DIAGNOSIS — M545 Low back pain, unspecified: Secondary | ICD-10-CM

## 2021-12-16 DIAGNOSIS — G620 Drug-induced polyneuropathy: Secondary | ICD-10-CM

## 2021-12-16 DIAGNOSIS — T451X5A Adverse effect of antineoplastic and immunosuppressive drugs, initial encounter: Secondary | ICD-10-CM

## 2021-12-16 DIAGNOSIS — F411 Generalized anxiety disorder: Secondary | ICD-10-CM

## 2021-12-16 MED ORDER — PREGABALIN 100 MG PO CAPS: 100.0000 mg | ORAL_CAPSULE | Freq: Two times a day (BID) | ORAL | 2 refills | Status: DC

## 2021-12-16 MED ORDER — FLUOXETINE HCL 20 MG PO CAPS
20.0000 mg | ORAL_CAPSULE | Freq: Every morning | ORAL | 3 refills | Status: DC
  Filled 2021-12-16: qty 90, 90d supply, fill #0

## 2021-12-16 MED ORDER — OXYBUTYNIN CHLORIDE ER 10 MG PO TB24
10.0000 mg | ORAL_TABLET | Freq: Every day | ORAL | 11 refills | Status: DC
  Filled 2021-12-16: qty 30, 30d supply, fill #0

## 2021-12-16 MED ORDER — PREGABALIN 100 MG PO CAPS
100.0000 mg | ORAL_CAPSULE | Freq: Two times a day (BID) | ORAL | 2 refills | Status: DC
  Filled 2021-12-16: qty 60, 30d supply, fill #0

## 2021-12-19 ENCOUNTER — Other Ambulatory Visit: Payer: Self-pay

## 2021-12-20 ENCOUNTER — Ambulatory Visit: Payer: No Typology Code available for payment source | Admitting: Physical Therapy

## 2021-12-20 ENCOUNTER — Other Ambulatory Visit: Payer: Self-pay

## 2021-12-20 ENCOUNTER — Encounter: Payer: Self-pay | Admitting: Physical Therapy

## 2021-12-20 DIAGNOSIS — Z483 Aftercare following surgery for neoplasm: Secondary | ICD-10-CM

## 2021-12-20 DIAGNOSIS — M25612 Stiffness of left shoulder, not elsewhere classified: Secondary | ICD-10-CM

## 2021-12-20 DIAGNOSIS — C50912 Malignant neoplasm of unspecified site of left female breast: Secondary | ICD-10-CM

## 2021-12-20 DIAGNOSIS — R293 Abnormal posture: Secondary | ICD-10-CM

## 2021-12-20 DIAGNOSIS — Z170 Estrogen receptor positive status [ER+]: Secondary | ICD-10-CM

## 2021-12-20 DIAGNOSIS — L599 Disorder of the skin and subcutaneous tissue related to radiation, unspecified: Secondary | ICD-10-CM

## 2021-12-20 NOTE — Therapy (Signed)
[name], [place], [address] Phone: [phone]   Fax:  [phone]  Physical Therapy Treatment  Patient Details  Name: Tricia Berry MRN: [id number] Date of Birth: March 24, 1971 Referring Provider (PT): Burnard Hawthorne, FNP   Encounter Date: 12/20/2021   PT End of Session - 12/20/21 1104     Visit Number 21    Number of Visits 22    Date for PT Re-Evaluation 12/29/21    Authorization Type [place] FOCUS reporting period from 08/25/2021    PT Start Time 1103    PT Stop Time 1157    PT Time Calculation (min) 54 min    Activity Tolerance Patient tolerated treatment well    Behavior During Therapy [facility] for tasks assessed/performed             Past Medical History:  Diagnosis Date   Anxiety    Breast cancer ([place])    Diverticulitis    Family history of ovarian cancer    GERD (gastroesophageal reflux disease)    IBS (irritable bowel syndrome)    PONV (postoperative nausea and vomiting)    severe migraine and vomiting post anesthesia   Scoliosis     Past Surgical History:  Procedure Laterality Date   ABDOMINAL HYSTERECTOMY     still has ovaries, no gyn cancer, hysterectomy due to endometriosis. NO cervix on exam 01/10/21   BREAST BIOPSY Left 09/15/2020   [place] bx of mass, path pending, Q marker   BREAST BIOPSY Left 09/15/2020   [place] bx of LN, hydromarker, path pending   BREAST RECONSTRUCTION WITH PLACEMENT OF TISSUE EXPANDER AND FLEX HD (ACELLULAR HYDRATED DERMIS) Bilateral 03/17/2021   Procedure: IMMEDIATE BILATERAL BREAST RECONSTRUCTION WITH PLACEMENT OF TISSUE EXPANDER AND FLEX HD (ACELLULAR HYDRATED DERMIS);  Surgeon: Wallace Going, DO;  Location: [facility];  Service: Plastics;  Laterality: Bilateral;   COLONOSCOPY WITH PROPOFOL N/A 11/21/2021   Procedure: COLONOSCOPY WITH PROPOFOL;  Surgeon: Lin Landsman, MD;  Location: [facility] ENDOSCOPY;  Service:  Gastroenterology;  Laterality: N/A;   IR IMAGING GUIDED PORT INSERTION  10/01/2020   MODIFIED MASTECTOMY Left 03/17/2021   Procedure: LEFT MODIFIED RADICAL MASTECTOMY;  Surgeon: Erroll Luna, MD;  Location: [place] [facility];  Service: General;  Laterality: Left;   PORTA CATH REMOVAL Right 03/17/2021   Procedure: PORTA CATH REMOVAL;  Surgeon: Erroll Luna, MD;  Location: [facility];  Service: General;  Laterality: Right;   REMOVAL OF BILATERAL TISSUE EXPANDERS WITH PLACEMENT OF BILATERAL BREAST IMPLANTS Bilateral 05/09/2021   Procedure: REMOVAL OF BILATERAL TISSUE EXPANDERS WITH PLACEMENT OF BILATERAL BREAST IMPLANTS;  Surgeon: Wallace Going, DO;  Location: [facility];  Service: Plastics;  Laterality: Bilateral;  90 min   TOTAL MASTECTOMY Right 03/17/2021   Procedure: TOTAL MASTECTOMY;  Surgeon: Erroll Luna, MD;  Location: [facility];  Service: General;  Laterality: Right;    There were no vitals filed for this visit.   Subjective Assessment - 12/20/21 1201     Subjective The cording still gets tight.    Pertinent History L breast cancer ER+/PR+ HER2-, stage III, underwent a bilateral mastectomy and ALND on L (7/16) on 03/17/21, reconstruction in summer 2022, neuropathy of hands, lower legs and feet, completed radiation, completed radiation Dec17, 2021 to February 18, 2021    Patient Stated Goals to gain info from providers    Currently in Pain? No/denies    Pain  Score 0-No pain                               [facility] Adult PT Treatment/Exercise - 12/20/21 0001       Exercises   Exercises Other Exercises    Other Exercises  Instruct pt in final exercises in Strength ABC program now pt has been instructed in entire program and will begin to add to HEP      Manual Therapy   Myofascial Release MFR done to 1 very thick cord which was palpable in L axilla ending down into breast and upwards in to upper arm                           PT Long Term Goals - 12/01/21 1157       PT LONG TERM GOAL #1   Title Pt will return to baseline shoulder ROM measurements and not demonstrate any signs or symptoms of lymphedema.    Baseline 05/31/21- pt has 180 degrees of flex and abd bilaterally    Time 4    Period Weeks    Status Achieved      PT LONG TERM GOAL #2   Title Pt will demonstrate 165 degrees of bilateral flexion to allow pt to reach overhead.    Baseline R 143 L 101; 05/02/21- 172 on R, 175 on L    Time 4    Period Weeks    Status Achieved      PT LONG TERM GOAL #3   Title Pt will demonstrate 165 degrees of bilateral shoulder abduction to allow her to reach out the side    Baseline R 152 L 78; 05/02/21- R 180 L 179    Time 4    Period Weeks    Status Achieved      PT LONG TERM GOAL #4   Title Pt will report she is no longer having muscle spasms across her chest to allow improved comfort    Baseline 05/02/21- pt reports she is no longer having muscle spasms    Time 4    Period Weeks    Status Achieved      PT LONG TERM GOAL #5   Title Pt will be independent in a home exercise program for long term strengthening and stretching.    Baseline 12/01/21- will instruct pt in Strength ABC next week    Time 4    Period Weeks    Status On-going      PT LONG TERM GOAL #6   Title Pt will report a 50% improvement in edema in L trunk and chest to allow improved comfort.    Baseline 05/02/21- 20% improved; 05/31/21 no edema present    Time 4    Period Weeks    Status Achieved      PT LONG TERM GOAL #7   Title Pt will be independent in Strength ABC program for long term stretching and strengthening after completion of radiation.    Time 4    Period Weeks    Status On-going      PT LONG TERM GOAL #8   Title Pt will demonstrate baseline ROM (180 degrees) of flexion and abduction of LUE to allow pt to return to prior level of function.    Baseline 160 flex, 164 abd; 09/29/21- flex- 160, abd  - 156 both with pain from cording in axilla  Time 4    Period Weeks    Status Achieved                   Plan - 12/20/21 1156     Clinical Impression Statement Instructed pt in the rest of the Strength ABC program. Pt will practice this over the next week. Continued with manual therapy to decrease cording in L axilla. Will assess cording again next week and determine whether to place pt on hold for a few weeks or to continue with weekly manual therapy to decrease cording.    PT Frequency 1x / week    PT Duration 4 weeks    PT Treatment/Interventions ADLs/Self Care Home Management;Therapeutic exercise;Patient/family education;Scar mobilization;Passive range of motion;Manual techniques;Manual lymph drainage;Compression bandaging;Taping;Vasopneumatic Device    PT Next Visit Plan manual therapy to cording in L axilla; Cont every 3 month L-Dex screens for up to 2 years from her SLNB    PT Home Exercise Plan post op breast exercises, instructed pt in supine dowel, supine scap series, windshield wiper yoga ex, bow and arrow yoga ex, strength ABC    Consulted and Agree with Plan of Care Patient             Patient will benefit from skilled therapeutic intervention in order to improve the following deficits and impairments:  Pain, Postural dysfunction, Decreased knowledge of precautions, Impaired UE functional use, Increased fascial restricitons, Decreased strength, Decreased range of motion, Decreased scar mobility, Increased edema, Increased muscle spasms  Visit Diagnosis: Stiffness of left shoulder, not elsewhere classified  Disorder of the skin and subcutaneous tissue related to radiation, unspecified  Aftercare following surgery for neoplasm  Abnormal posture  Malignant 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, estrogen receptor positive, unspecified site of breast [facility])     Problem List Patient Active Problem List   Diagnosis Date Noted   Colon cancer screening    Family  history of heart disease 11/16/2021   Acquired absence of breast 08/16/2021   Dysuria 08/05/2021   B12 deficiency 07/21/2021   Atherosclerosis of aorta ([place]) 07/20/2021   Hepatic steatosis 07/20/2021   Abdominal pain 07/18/2021   Breast cancer ([place]) 03/17/2021   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/14/2021   Genetic testing 11/22/2020   Family history of ovarian cancer    Candidal vulvovaginitis 11/01/2020   Low back pain 10/06/2020   Encounter for medical examination to establish care 09/26/2020   Malignant neoplasm of upper-outer quadrant of left breast in female, estrogen receptor positive ([place]) 09/26/2020   BACK STRAIN, LUMBAR 03/08/2010   INTERNAL HEMORRHOIDS 01/27/2010   ANAL FISSURE 01/27/2010   OSTEOARTHRITIS, HANDS, BILATERAL 01/04/2010   ARTHRALGIA 10/05/2009   INSOMNIA, CHRONIC 05/06/2009   ALLERGIC RHINITIS, SEASONAL 02/09/2009   TOBACCO ABUSE 12/29/2008   IBS 10/13/2008   Anxiety state 08/25/2008   DEPRESSION, RECURRENT 08/25/2008    Allyson Sabal [place], PT 12/20/2021, 12:01 PM  [facility] @ [place], [place], [address] Phone: [phone]   Fax:  [phone]  Name: DELENE MORAIS MRN: [id number] Date of Birth: 1971-10-14

## 2021-12-21 ENCOUNTER — Other Ambulatory Visit: Payer: Self-pay | Admitting: *Deleted

## 2021-12-21 ENCOUNTER — Inpatient Hospital Stay: Payer: No Typology Code available for payment source

## 2021-12-21 ENCOUNTER — Other Ambulatory Visit: Payer: Self-pay

## 2021-12-21 ENCOUNTER — Inpatient Hospital Stay: Payer: No Typology Code available for payment source | Attending: Oncology | Admitting: Oncology

## 2021-12-21 ENCOUNTER — Other Ambulatory Visit: Payer: No Typology Code available for payment source

## 2021-12-21 ENCOUNTER — Telehealth (HOSPITAL_COMMUNITY): Payer: Self-pay | Admitting: Emergency Medicine

## 2021-12-21 ENCOUNTER — Encounter: Payer: Self-pay | Admitting: Oncology

## 2021-12-21 VITALS — BP 112/70 | HR 73 | Temp 97.1°F | Resp 16 | Ht 65.0 in | Wt 116.0 lb

## 2021-12-21 DIAGNOSIS — C50412 Malignant neoplasm of upper-outer quadrant of left female breast: Secondary | ICD-10-CM

## 2021-12-21 DIAGNOSIS — C50919 Malignant neoplasm of unspecified site of unspecified female breast: Secondary | ICD-10-CM

## 2021-12-21 DIAGNOSIS — Z170 Estrogen receptor positive status [ER+]: Secondary | ICD-10-CM

## 2021-12-21 DIAGNOSIS — Z5112 Encounter for antineoplastic immunotherapy: Secondary | ICD-10-CM | POA: Diagnosis not present

## 2021-12-21 DIAGNOSIS — Z79899 Other long term (current) drug therapy: Secondary | ICD-10-CM | POA: Diagnosis not present

## 2021-12-21 DIAGNOSIS — T451X5A Adverse effect of antineoplastic and immunosuppressive drugs, initial encounter: Secondary | ICD-10-CM | POA: Diagnosis not present

## 2021-12-21 DIAGNOSIS — G620 Drug-induced polyneuropathy: Secondary | ICD-10-CM | POA: Diagnosis not present

## 2021-12-21 HISTORY — DX: Malignant neoplasm of unspecified site of unspecified female breast: C50.919

## 2021-12-21 LAB — CBC WITH DIFFERENTIAL/PLATELET
Abs Immature Granulocytes: 0.03 10*3/uL (ref 0.00–0.07)
Basophils Absolute: 0 10*3/uL (ref 0.0–0.1)
Basophils Relative: 1 %
Eosinophils Absolute: 0.1 10*3/uL (ref 0.0–0.5)
Eosinophils Relative: 1 %
HCT: 42.9 % (ref 36.0–46.0)
Hemoglobin: 14.2 g/dL (ref 12.0–15.0)
Immature Granulocytes: 0 %
Lymphocytes Relative: 38 %
Lymphs Abs: 2.9 10*3/uL (ref 0.7–4.0)
MCH: 30.5 pg (ref 26.0–34.0)
MCHC: 33.1 g/dL (ref 30.0–36.0)
MCV: 92.1 fL (ref 80.0–100.0)
Monocytes Absolute: 0.6 10*3/uL (ref 0.1–1.0)
Monocytes Relative: 8 %
Neutro Abs: 3.9 10*3/uL (ref 1.7–7.7)
Neutrophils Relative %: 52 %
Platelets: 222 10*3/uL (ref 150–400)
RBC: 4.66 MIL/uL (ref 3.87–5.11)
RDW: 14.6 % (ref 11.5–15.5)
WBC: 7.6 10*3/uL (ref 4.0–10.5)
nRBC: 0 % (ref 0.0–0.2)

## 2021-12-21 LAB — COMPREHENSIVE METABOLIC PANEL WITH GFR
ALT: 25 U/L (ref 0–44)
AST: 27 U/L (ref 15–41)
Albumin: 4.2 g/dL (ref 3.5–5.0)
Alkaline Phosphatase: 67 U/L (ref 38–126)
Anion gap: 4 — ABNORMAL LOW (ref 5–15)
BUN: 9 mg/dL (ref 6–20)
CO2: 30 mmol/L (ref 22–32)
Calcium: 9.2 mg/dL (ref 8.9–10.3)
Chloride: 102 mmol/L (ref 98–111)
Creatinine, Ser: 0.93 mg/dL (ref 0.44–1.00)
GFR, Estimated: >60 mL/min
Glucose, Bld: 109 mg/dL — ABNORMAL HIGH (ref 70–99)
Potassium: 3.8 mmol/L (ref 3.5–5.1)
Sodium: 136 mmol/L (ref 135–145)
Total Bilirubin: <0.1 mg/dL — ABNORMAL LOW (ref 0.3–1.2)
Total Protein: 7.2 g/dL (ref 6.5–8.1)

## 2021-12-21 MED ORDER — PERTUZ-TRASTUZ-HYALURON-ZZXF 60-60-2000 MG-MG-U/ML CHEMO ~~LOC~~ SOLN
10.0000 mL | Freq: Once | SUBCUTANEOUS | Status: AC
  Administered 2021-12-21: 10 mL via SUBCUTANEOUS
  Filled 2021-12-21: qty 10

## 2021-12-21 MED ORDER — DIAZEPAM 5 MG PO TABS
ORAL_TABLET | ORAL | 0 refills | Status: DC
  Filled 2021-12-21: qty 1, 1d supply, fill #0

## 2021-12-21 MED ORDER — OXYCODONE HCL 10 MG PO TABS
10.0000 mg | ORAL_TABLET | Freq: Three times a day (TID) | ORAL | 0 refills | Status: DC | PRN
  Filled 2021-12-21: qty 90, 30d supply, fill #0

## 2021-12-21 MED ORDER — DIPHENHYDRAMINE HCL 25 MG PO CAPS
50.0000 mg | ORAL_CAPSULE | Freq: Once | ORAL | Status: AC
  Administered 2021-12-21: 50 mg via ORAL
  Filled 2021-12-21: qty 2

## 2021-12-21 MED ORDER — ACETAMINOPHEN 325 MG PO TABS
650.0000 mg | ORAL_TABLET | Freq: Once | ORAL | Status: AC
  Administered 2021-12-21: 650 mg via ORAL
  Filled 2021-12-21: qty 2

## 2021-12-21 NOTE — Progress Notes (Signed)
Hematology/Oncology Consult note [facility]  Telephone:([phone] [phone]  Patient Care Team: Burnard Hawthorne, FNP as PCP - General (Family Medicine) Kate Sable, MD as PCP - Cardiology (Cardiology) Rico Junker, RN as Oncology Nurse Navigator Sindy Guadeloupe, MD as Consulting Physician (Hematology and Oncology)   Name of the patient: Tricia Berry  [id number]  02-19-1971   Date of visit: 12/21/21  Diagnosis- invasive mammary carcinoma of the left breast stage III ypT2 ypN2 cM0 ER/PR positive and HER2 positive by [facility]  Chief complaint/ Reason for visit-on treatment assessment prior to cycle 11 of adjuvant Herceptin and Perjeta  Heme/Onc history: 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  She then noticed that her lump is gradually getting larger with an area of ulceration on the skin and underwent a diagnostic bilateral mammogram on 09/08/2020 which showed hypoechoic interconnected masses in the left breast from 10:00 to 2 o'clock position spanning at least an area of 4.8 cm.  Ultrasound also showed 5 abnormal lymph nodes in the axilla with cortical thickening.  Both the mass and the lymph nodes were biopsied and was consistent with invasive mammary carcinoma grade 2.  Tumor was ER +91- 100%, PR +11 to 20% and HER-2 negative.  Ki-67 15%. Patient will be meeting [name] from [facility] surgery to discuss surgical management.  She is here for medical oncology recommendations.  In terms of her general health patient is otherwise doing well and does not have significant comorbidities.  She does endorse significant pain in the area of her left breast.  Tylenol and Motrin has not been helping her with this pain.  She lives with her husband and 2 children who  have special needs.  No prior history of abnormal breast mammograms or breast biopsies.   PET CT scan showed hypermetabolism in the area of the left breast but no evidence of hypermetabolism in the left axilla Or evidence of distant metastatic disease.   Neoadjuvant dose dense ACT chemotherapy started on 10/08/2020. Interim ultrasound after 4 cycles of dose dense AC chemotherapy showed overall decrease in volume of the tumor.  Nodularity previously seen on ultrasound was also decreased in size.   Patient completed neoadjuvant AC Taxol chemotherapy and underwent bilateral mastectomy with reconstruction.Final pathology showed fibrocystic changes in the right breast with no malignancy.  3.6 cm invasive mammary carcinoma in the left breast.  7 out of 16 lymph nodes positive for malignancy.  Treatment effect in the breast present but not robust.  Treatment effect in the lymph nodes minimal.  Margins negative.  Overall grade 2.  Extranodal extension present.  ER greater than 90% positive, PR 15% positive.  HER2 +2 equivocal and positive by FISH  Interval history-has ongoing chronic back pain as well as neuropathy in her feet.  She is working with her primary care doctor and trying to wean herself off oxycodone.  Presently she uses 3 oxycodones on 1 day and 2 on the other and slowly trying to substitute the oxycodone with tramadol.  ECOG PS- 1 Pain scale- 4   Review of systems- Review of Systems  Constitutional:  Positive for malaise/fatigue. Negative for chills, fever and weight loss.  HENT:  Negative for congestion, ear discharge and nosebleeds.   Eyes:  Negative  for blurred vision.  Respiratory:  Negative for cough, hemoptysis, sputum production, shortness of breath and wheezing.   Cardiovascular:  Negative for chest pain, palpitations, orthopnea and claudication.  Gastrointestinal:  Negative for abdominal pain, blood in stool, constipation, diarrhea, heartburn, melena, nausea and vomiting.   Genitourinary:  Negative for dysuria, flank pain, frequency, hematuria and urgency.  Musculoskeletal:  Positive for back pain. Negative for joint pain and myalgias.  Skin:  Negative for rash.  Neurological:  Positive for sensory change (Peripheral neuropathy). Negative for dizziness, tingling, focal weakness, seizures, weakness and headaches.  Endo/Heme/Allergies:  Does not bruise/bleed easily.  Psychiatric/Behavioral:  Negative for depression and suicidal ideas. The patient does not have insomnia.       Allergies  Allergen Reactions   Morphine Nausea And Vomiting and Other (See Comments)    migranes Other reaction(s): Headache Migraine, vomiting   Sertraline Hcl     REACTION: Worsened symptoms of IBS   Sulfa Antibiotics Rash   Sulfamethoxazole Rash   Sulfonamide Derivatives Rash     Past Medical History:  Diagnosis Date   Anxiety    Breast cancer ([place])    Diverticulitis    Family history of ovarian cancer    GERD (gastroesophageal reflux disease)    IBS (irritable bowel syndrome)    PONV (postoperative nausea and vomiting)    severe migraine and vomiting post anesthesia   Scoliosis      Past Surgical History:  Procedure Laterality Date   ABDOMINAL HYSTERECTOMY     still has ovaries, no gyn cancer, hysterectomy due to endometriosis. NO cervix on exam 01/10/21   BREAST BIOPSY Left 09/15/2020   [place] bx of mass, path pending, Q marker   BREAST BIOPSY Left 09/15/2020   [place] bx of LN, hydromarker, path pending   BREAST RECONSTRUCTION WITH PLACEMENT OF TISSUE EXPANDER AND FLEX HD (ACELLULAR HYDRATED DERMIS) Bilateral 03/17/2021   Procedure: IMMEDIATE BILATERAL BREAST RECONSTRUCTION WITH PLACEMENT OF TISSUE EXPANDER AND FLEX HD (ACELLULAR HYDRATED DERMIS);  Surgeon: Wallace Going, DO;  Location: [place];  Service: Plastics;  Laterality: Bilateral;   COLONOSCOPY WITH PROPOFOL N/A 11/21/2021   Procedure: COLONOSCOPY WITH PROPOFOL;  Surgeon: Lin Landsman, MD;  Location: [facility] ENDOSCOPY;  Service: Gastroenterology;  Laterality: N/A;   IR IMAGING GUIDED PORT INSERTION  10/01/2020   MODIFIED MASTECTOMY Left 03/17/2021   Procedure: LEFT MODIFIED RADICAL MASTECTOMY;  Surgeon: Erroll Luna, MD;  Location: [place];  Service: General;  Laterality: Left;   PORTA CATH REMOVAL Right 03/17/2021   Procedure: PORTA CATH REMOVAL;  Surgeon: Erroll Luna, MD;  Location: [place];  Service: General;  Laterality: Right;   REMOVAL OF BILATERAL TISSUE EXPANDERS WITH PLACEMENT OF BILATERAL BREAST IMPLANTS Bilateral 05/09/2021   Procedure: REMOVAL OF BILATERAL TISSUE EXPANDERS WITH PLACEMENT OF BILATERAL BREAST IMPLANTS;  Surgeon: Wallace Going, DO;  Location: [place];  Service: Plastics;  Laterality: Bilateral;  90 min   TOTAL MASTECTOMY Right 03/17/2021   Procedure: TOTAL MASTECTOMY;  Surgeon: Erroll Luna, MD;  Location: [place];  Service: General;  Laterality: Right;    Social History   Socioeconomic History   Marital status: Divorced    Spouse name: Not on file   Number of children: 2   Years of education: Not on file   Highest education level: Not on file  Occupational History   Occupation: Nurse    Employer: [employer]  Tobacco Use   Smoking status:  Every Day    Packs/day: 0.25    Types: Cigarettes   Smokeless tobacco: Never   Tobacco comments:    Pt trying to quit now. Has reduced amount significantly  Vaping Use   Vaping Use: Never used  Substance and Sexual Activity   Alcohol use: Not Currently   Drug use: No   Sexual activity: Not Currently    Birth control/protection: None  Other Topics Concern   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 She and her spouse are primary caregivers.    Social Determinants of Health   Financial Resource Strain: Not on file  Food  Insecurity: Not on file  Transportation Needs: Not on file  Physical Activity: Not on file  Stress: Not on file  Social Connections: Not on file  Intimate Partner Violence: Not on file    Family History  Problem Relation Age of Onset   Hypertension Mother    Osteoarthritis Mother    Diverticulitis Mother    Heart failure Father    Hypertension Father    Gout Father    Heart attack Father 40   Diverticulitis Brother    Ovarian cancer Paternal Grandmother      Current Outpatient Medications:    anastrozole (ARIMIDEX) 1 MG tablet, Take 1 tablet (1 mg total) by mouth daily., Disp: 30 tablet, Rfl: 3   cyanocobalamin (,VITAMIN B-12,) 1000 MCG/ML injection, 1000 mcg (1 mL) intramuscular injection in the thigh ( vastus lateralis) once per month., Disp: 3 mL, Rfl: 4   FLUoxetine (PROZAC) 20 MG capsule, Take 1 capsule (20 mg total) by mouth every morning., Disp: 90 capsule, Rfl: 3   Lactobacillus (PROBIOTIC ACIDOPHILUS PO), Take 1 capsule by mouth daily., Disp: , Rfl:    lidocaine-prilocaine (EMLA) cream, Apply 1 application topically as needed., Disp: 30 g, Rfl: 0   LORazepam (ATIVAN) 0.5 MG tablet, Take 1 tablet (0.5 mg total) by mouth at bedtime as needed for up to 30 doses for anxiety., Disp: 30 tablet, Rfl: 0   metoprolol tartrate (LOPRESSOR) 100 MG tablet, Take 1 tablet (100 mg total) by mouth once for 1 dose. Take 2 hours prior to your CT scan., Disp: 1 tablet, Rfl: 0   Multiple Vitamins-Minerals (MULTIVITAL PO), Take 1 Dose by mouth daily., Disp: , Rfl:    nicotine (NICODERM CQ - DOSED IN MG/24 HOURS) 21 mg/24hr patch, use 1 patch daily, Disp: 28 patch, Rfl: 0   nicotine polacrilex (SM NICOTINE) 2 MG lozenge, Take by mouth., Disp: 72 lozenge, Rfl: 0   ondansetron (ZOFRAN) 8 MG tablet, Take 1 tablet (8 mg total) by mouth every 8 (eight) hours as needed for nausea or vomiting., Disp: 60 tablet, Rfl: 0   pregabalin (LYRICA) 100 MG capsule, Take 1 capsule (100 mg total) by mouth 2 (two)  times daily., Disp: 60 capsule, Rfl: 2   rosuvastatin (CRESTOR) 5 MG tablet, Take 1 tablet (5 mg total) by mouth every evening., Disp: 90 tablet, Rfl: 3   tiZANidine (ZANAFLEX) 4 MG tablet, Take 1 tablet (4 mg total) by mouth 3 (three) times daily., Disp: 30 tablet, Rfl: 0   traMADol (ULTRAM) 50 MG tablet, Take 1 tablet (50 mg total) by mouth every 12 (twelve) hours as needed for up to 5 days., Disp: 60 tablet, Rfl: 1   traZODone (DESYREL) 50 MG tablet, Take 0.5-1 tablets (25-50 mg total) by mouth at  bedtime as needed for sleep., Disp: 30 tablet, Rfl: 3   trimethoprim (TRIMPEX) 100 MG tablet, Take 1 tablet (100 mg total) by mouth daily., Disp: 30 tablet, Rfl: 5   oxybutynin (DITROPAN-XL) 10 MG 24 hr tablet, Take 1 tablet (10 mg total) by mouth daily. (Patient not taking: Reported on 12/21/2021), Disp: 30 tablet, Rfl: 11   Oxycodone HCl 10 MG TABS, Take 1 tablet (10 mg total) by mouth every 8 (eight) hours as needed., Disp: 90 tablet, Rfl: 0 No current facility-administered medications for this visit.  Facility-Administered Medications Ordered in Other Visits:    goserelin (ZOLADEX) injection 3.6 mg, 3.6 mg, Subcutaneous, Q28 days, Sindy Guadeloupe, MD, 3.6 mg at 05/04/21 1150   heparin lock flush 100 unit/mL, 500 Units, Intravenous, Once, Sindy Guadeloupe, MD   pertuz-trastuz-hyaluron-zzxf (PHESGO) 600-600-20000 MG-MG-U/10ML chemo SQ injection maintenance dose 10 mL, 10 mL, Subcutaneous, Once, Sindy Guadeloupe, MD  Physical exam:  Vitals:   12/21/21 1402  BP: 112/70  Pulse: 73  Resp: 16  Temp: (!) 97.1 F (36.2 C)  TempSrc: Tympanic  SpO2: 96%  Weight: 116 lb (52.6 kg)  Height: _0  (1.651 m)   Physical Exam Constitutional:      General: She is not in acute distress. Cardiovascular:     Rate and Rhythm: Normal rate and regular rhythm.     Heart sounds: Normal heart sounds.  Pulmonary:     Effort: Pulmonary effort is normal.     Breath sounds: Normal breath sounds.  Skin:    General:  Skin is warm and dry.  Neurological:     Mental Status: She is alert and oriented to person, place, and time.     CMP Latest Ref Rng & Units 12/21/2021  Glucose 70 - 99 mg/dL 109(H)  BUN 6 - 20 mg/dL 9  Creatinine 0.44 - 1.00 mg/dL 0.93  Sodium 135 - 145 mmol/L 136  Potassium 3.5 - 5.1 mmol/L 3.8  Chloride 98 - 111 mmol/L 102  CO2 22 - 32 mmol/L 30  Calcium 8.9 - 10.3 mg/dL 9.2  Total Protein 6.5 - 8.1 g/dL 7.2  Total Bilirubin 0.3 - 1.2 mg/dL <0.1(L)  Alkaline Phos 38 - 126 U/L 67  AST 15 - 41 U/L 27  ALT 0 - 44 U/L 25   CBC Latest Ref Rng & Units 12/21/2021  WBC 4.0 - 10.5 K/uL 7.6  Hemoglobin 12.0 - 15.0 g/dL 14.2  Hematocrit 36.0 - 46.0 % 42.9  Platelets 150 - 400 K/uL 222    No images are attached to the encounter.  CT HEMATURIA WORKUP  Result Date: 12/10/2021 CLINICAL DATA:  Recurrent UTIs with difficulty voiding. EXAM: CT ABDOMEN AND PELVIS WITHOUT AND WITH CONTRAST TECHNIQUE: Multidetector CT imaging of the abdomen and pelvis was performed following the standard protocol before and following the bolus administration of intravenous contrast. RADIATION DOSE REDUCTION: This exam was performed according to the departmental dose-optimization program which includes automated exposure control, adjustment of the mA and/or kV according to patient size and/or use of iterative reconstruction technique. CONTRAST:  140m OMNIPAQUE IOHEXOL 300 MG/ML  SOLN COMPARISON:  CT July 18, 2021 FINDINGS: Lower chest: No acute abnormality. Hepatobiliary: No suspicious hepatic lesion. Gallbladder is unremarkable. No biliary ductal dilation. Pancreas: No pancreatic ductal dilation or evidence of acute inflammation. Spleen: Normal size spleen without focal lesion. Adrenals/Urinary Tract: Bilateral adrenal glands appear normal. Bilateral extrarenal pelvis. No hydronephrosis. No renal, ureteral or bladder calculi identified. No solid enhancing renal mass. Kidneys  demonstrate symmetric enhancement and  excretion of contrast material. No collecting system loop duplication. No suspicious filling defect visualized within the opacified portions of the collecting systems or ureters on delayed imaging. However the distal right ureter is not well opacified limiting evaluation. Evaluation of the urinary bladder is slightly limited by urine contamination within this context there is no abnormal wall thickening or suspicious intraluminal filling defect identified. Stomach/Bowel: No enteric contrast was administered. Stomach is unremarkable for degree of distension. No pathologic dilation of small or large bowel. The appendix and terminal ileum appear normal. Left-sided colonic diverticulosis with unchanged sigmoid colonic wall thickening without adjacent inflammation. No evidence of acute bowel inflammation. Vascular/Lymphatic: Aortic and branch vessel atherosclerosis without abdominal aortic aneurysm. No pathologically enlarged abdominal or pelvic lymph nodes. Reproductive: Status post hysterectomy. No adnexal masses. Other: No significant abdominopelvic free fluid. No pneumoperitoneum. No walled off fluid collections. Musculoskeletal: Levoconvex curvature of the thoracolumbar spine with associated degenerative change. No acute osseous abnormality. Scattered benign bone islands in the pelvic girdle. IMPRESSION: 1. No hydronephrosis.  No renal, ureteral or bladder calculi. 2. No solid enhancing renal mass. 3. Left-sided colonic diverticulosis with unchanged sigmoid colonic wall thickening without adjacent inflammation, findings which are favored to reflect chronic changes of prior diverticulitis versus segmental colitis associated with diverticulosis. 4. Aortic Atherosclerosis (ICD10-I70.0). Electronically Signed   By: Dahlia Bailiff M.D.   On: 12/10/2021 10:50     Assessment and plan- Patient is a 52 y.o. female  with known history of locally advanced left breast cancer which was ER/PR positive and HER2 negative on  initial biopsy s/p neoadjuvant AC Taxol chemotherapy final pathology showed 3.6 cm residual breast mass with 7 positive lymph nodes with extracapsular extension.  ER/PR positive and HER2 positive.  She is here for on treatment assessment prior to cycle 11 of adjuvant Herceptin and Perjeta  Counseled to proceed with cycle 11 of adjuvant Herceptin and Perjeta today.  She will directly proceed for cycle 12 in 3 weeks and I will see him back in 6 weeks for cycle 13.  She will be completing adjuvant Herceptin and Perjeta treatment in September 2023.  She will need an echocardiogram in 6 to 7 weeks  Patient has ER positive disease and is on ovarian suppression with Zoladex which she will receive again in 3 weeks.  She is also continuing Arimidex.  Patient received her last dose of Zometa on 11/25/2021 and would be due for her next dose in May 2023.  Chronic back pain and peripheral neuropathy: I have refilled oxycodone 90 tablets today which she will use it for 2 months with a plan to gradually get off oxycodone altogether in the future.  Patient understands that I will not be able to prescribe oxycodone long-term   Visit Diagnosis 1. Encounter for monoclonal antibody treatment for malignancy   2. Malignant neoplasm of upper-outer quadrant of left breast in female, estrogen receptor positive ([place])   3. Chemotherapy-induced peripheral neuropathy ([place])      Dr. Randa Evens, MD, MPH [facility] [phone] 12/21/2021 3:59 PM

## 2021-12-21 NOTE — Telephone Encounter (Signed)
Attempted to call patient regarding upcoming cardiac CT appointment. °Left message on voicemail with name and callback number °Adonai Helzer RN Navigator Cardiac Imaging °[facility] °[phone] Office °[phone] Cell ° °

## 2021-12-21 NOTE — Progress Notes (Signed)
Orders in 

## 2021-12-22 ENCOUNTER — Other Ambulatory Visit: Payer: Self-pay

## 2021-12-22 ENCOUNTER — Ambulatory Visit
Admission: RE | Admit: 2021-12-22 | Discharge: 2021-12-22 | Disposition: A | Discharge status: Home / Self Care | Payer: No Typology Code available for payment source | Source: Ambulatory Visit | Attending: Cardiology | Admitting: Cardiology

## 2021-12-22 DIAGNOSIS — R9431 Abnormal electrocardiogram [ECG] [EKG]: Secondary | ICD-10-CM | POA: Diagnosis not present

## 2021-12-22 DIAGNOSIS — R0609 Other forms of dyspnea: Secondary | ICD-10-CM

## 2021-12-22 MED ORDER — NITROGLYCERIN 0.4 MG SL SUBL
0.4000 mg | SUBLINGUAL_TABLET | Freq: Once | SUBLINGUAL | Status: AC
Start: 2021-12-22 — End: 2021-12-22
  Administered 2021-12-22: 0.4 mg via SUBLINGUAL

## 2021-12-22 MED ORDER — IOHEXOL 350 MG/ML SOLN: 60.0000 mL | Freq: Once | INTRAVENOUS | Status: DC | PRN

## 2021-12-22 MED ORDER — IOHEXOL 350 MG/ML SOLN
65.0000 mL | Freq: Once | INTRAVENOUS | Status: AC | PRN
  Administered 2021-12-22: 65 mL via INTRAVENOUS

## 2021-12-22 NOTE — Progress Notes (Signed)
Patient tolerated procedure well. Ambulate w/o difficulty. Denies light headedness or being dizzy. Sitting in chair drinking water provided. Encouraged to drink extra water today and reasoning explained. Verbalized understanding. All questions answered. ABC intact. No further needs. Discharge from procedure area w/o issues.   °

## 2021-12-23 ENCOUNTER — Other Ambulatory Visit: Payer: Self-pay

## 2021-12-23 ENCOUNTER — Encounter: Payer: Self-pay | Admitting: Cardiology

## 2021-12-23 ENCOUNTER — Ambulatory Visit (INDEPENDENT_AMBULATORY_CARE_PROVIDER_SITE_OTHER): Payer: No Typology Code available for payment source | Admitting: Family

## 2021-12-23 ENCOUNTER — Telehealth: Payer: Self-pay

## 2021-12-23 ENCOUNTER — Encounter: Payer: Self-pay | Admitting: Family

## 2021-12-23 VITALS — BP 96/52 | HR 62 | Temp 98.0°F | Ht 60.0 in | Wt 116.0 lb

## 2021-12-23 DIAGNOSIS — M545 Low back pain, unspecified: Secondary | ICD-10-CM | POA: Diagnosis not present

## 2021-12-23 MED ORDER — TRAMADOL HCL 50 MG PO TABS
50.0000 mg | ORAL_TABLET | Freq: Three times a day (TID) | ORAL | 0 refills | Status: DC | PRN
Start: 2021-12-23 — End: 2022-01-20
  Filled 2021-12-23 – 2021-12-29 (×2): qty 90, 30d supply, fill #0

## 2021-12-23 MED ORDER — PREGABALIN 150 MG PO CAPS
150.0000 mg | ORAL_CAPSULE | Freq: Two times a day (BID) | ORAL | 1 refills | Status: DC
Start: 2021-12-23 — End: 2022-01-06
  Filled 2021-12-23: qty 60, 30d supply, fill #0

## 2021-12-23 NOTE — Progress Notes (Signed)
Subjective:    Patient ID: Tricia Berry, female    DOB: 1971/04/28, 51 y.o.   MRN: [id number]  CC: Tricia Berry is a 51 y.o. female who presents today for follow up.   HPI: Follow-up low back pain Compliant with Lyrica 100 mg twice daily.  She is using tramadol 100 mg midday as 50mg  midday wasn't helpful.  She has weaned from oxycodone she has noticed peripheral neuropathy in hands and feet is very painful for her.  She is pursuing acupuncture through her oncology nurse navigator.  She would like to get back to work, and she is concerned that neuropathy and chronic low back pain will prohibit this.  She wants to use as least medication as possible and she is optimistic that following with physiatry ultimately will leave in reduction of pain.      follow-up with Dr. Janese Banks 12/21/2021 for left breast cancer, chemotherapy-induced peripheral neuropathy.  She refilled oxycodone 90 tablets which she will use for 2 months with a plan to gradually altogether.  Seen 12/21/2021 with me kneeler, NP for chronic lateral low back pain.  Plan for radiofrequency ablation and ESI Dr Sharlet Salina 12/30/21  HISTORY:  Past Medical History:  Diagnosis Date   Anxiety    Breast cancer ([place])    Diverticulitis    Family history of ovarian cancer    GERD (gastroesophageal reflux disease)    IBS (irritable bowel syndrome)    PONV (postoperative nausea and vomiting)    severe migraine and vomiting post anesthesia   Scoliosis    Past Surgical History:  Procedure Laterality Date   ABDOMINAL HYSTERECTOMY     still has ovaries, no gyn cancer, hysterectomy due to endometriosis. NO cervix on exam 01/10/21   BREAST BIOPSY Left 09/15/2020   [place] bx of mass, path pending, Q marker   BREAST BIOPSY Left 09/15/2020   [place] bx of LN, hydromarker, path pending   BREAST RECONSTRUCTION WITH PLACEMENT OF TISSUE EXPANDER AND FLEX HD (ACELLULAR HYDRATED DERMIS) Bilateral 03/17/2021   Procedure: IMMEDIATE BILATERAL BREAST  RECONSTRUCTION WITH PLACEMENT OF TISSUE EXPANDER AND FLEX HD (ACELLULAR HYDRATED DERMIS);  Surgeon: Wallace Going, DO;  Location: [place];  Service: Plastics;  Laterality: Bilateral;   COLONOSCOPY WITH PROPOFOL N/A 11/21/2021   Procedure: COLONOSCOPY WITH PROPOFOL;  Surgeon: Lin Landsman, MD;  Location: [facility] ENDOSCOPY;  Service: Gastroenterology;  Laterality: N/A;   IR IMAGING GUIDED PORT INSERTION  10/01/2020   MODIFIED MASTECTOMY Left 03/17/2021   Procedure: LEFT MODIFIED RADICAL MASTECTOMY;  Surgeon: Erroll Luna, MD;  Location: [place];  Service: General;  Laterality: Left;   PORTA CATH REMOVAL Right 03/17/2021   Procedure: PORTA CATH REMOVAL;  Surgeon: Erroll Luna, MD;  Location: [place];  Service: General;  Laterality: Right;   REMOVAL OF BILATERAL TISSUE EXPANDERS WITH PLACEMENT OF BILATERAL BREAST IMPLANTS Bilateral 05/09/2021   Procedure: REMOVAL OF BILATERAL TISSUE EXPANDERS WITH PLACEMENT OF BILATERAL BREAST IMPLANTS;  Surgeon: Wallace Going, DO;  Location: [place];  Service: Plastics;  Laterality: Bilateral;  90 min   TOTAL MASTECTOMY Right 03/17/2021   Procedure: TOTAL MASTECTOMY;  Surgeon: Erroll Luna, MD;  Location: [place];  Service: General;  Laterality: Right;   Family History  Problem Relation Age of Onset   Hypertension Mother    Osteoarthritis Mother    Diverticulitis Mother    Heart failure Father    Hypertension Father    Gout Father  Heart attack Father 55   Diverticulitis Brother    Ovarian cancer Paternal Grandmother     Allergies: Morphine, Sertraline hcl, Sulfa antibiotics, Sulfamethoxazole, and Sulfonamide derivatives Current Outpatient Medications on File Prior to Visit  Medication Sig Dispense Refill   anastrozole (ARIMIDEX) 1 MG tablet Take 1 tablet (1 mg total) by mouth daily. 30 tablet 3   cyanocobalamin (,VITAMIN B-12,) 1000 MCG/ML  injection 1000 mcg (1 mL) intramuscular injection in the thigh ( vastus lateralis) once per month. 3 mL 4   FLUoxetine (PROZAC) 20 MG capsule Take 1 capsule (20 mg total) by mouth every morning. 90 capsule 3   Lactobacillus (PROBIOTIC ACIDOPHILUS PO) Take 1 capsule by mouth daily.     lidocaine-prilocaine (EMLA) cream Apply 1 application topically as needed. 30 g 0   LORazepam (ATIVAN) 0.5 MG tablet Take 1 tablet (0.5 mg total) by mouth at bedtime as needed for up to 30 doses for anxiety. 30 tablet 0   Multiple Vitamins-Minerals (MULTIVITAL PO) Take 1 Dose by mouth daily.     nicotine (NICODERM CQ - DOSED IN MG/24 HOURS) 21 mg/24hr patch use 1 patch daily 28 patch 0   nicotine polacrilex (SM NICOTINE) 2 MG lozenge Take by mouth. 72 lozenge 0   ondansetron (ZOFRAN) 8 MG tablet Take 1 tablet (8 mg total) by mouth every 8 (eight) hours as needed for nausea or vomiting. 60 tablet 0   oxybutynin (DITROPAN-XL) 10 MG 24 hr tablet Take 1 tablet (10 mg total) by mouth daily. 30 tablet 11   Oxycodone HCl 10 MG TABS Take 1 tablet (10 mg total) by mouth every 8 (eight) hours as needed. 90 tablet 0   rosuvastatin (CRESTOR) 5 MG tablet Take 1 tablet (5 mg total) by mouth every evening. 90 tablet 3   tiZANidine (ZANAFLEX) 4 MG tablet Take 1 tablet (4 mg total) by mouth 3 (three) times daily. 30 tablet 0   traZODone (DESYREL) 50 MG tablet Take 0.5-1 tablets (25-50 mg total) by mouth at bedtime as needed for sleep. 30 tablet 3   trimethoprim (TRIMPEX) 100 MG tablet Take 1 tablet (100 mg total) by mouth daily. 30 tablet 5   Current Facility-Administered Medications on File Prior to Visit  Medication Dose Route Frequency Provider Last Rate Last Admin   goserelin (ZOLADEX) injection 3.6 mg  3.6 mg Subcutaneous Q28 days Sindy Guadeloupe, MD   3.6 mg at 05/04/21 1150   heparin lock flush 100 unit/mL  500 Units Intravenous Once Sindy Guadeloupe, MD        Social History   Tobacco Use   Smoking status: Every Day     Packs/day: 0.25    Types: Cigarettes   Smokeless tobacco: Never   Tobacco comments:    Pt trying to quit now. Has reduced amount significantly  Vaping Use   Vaping Use: Never used  Substance Use Topics   Alcohol use: Not Currently   Drug use: No    Review of Systems  Constitutional:  Negative for chills and fever.  Respiratory:  Negative for cough.   Cardiovascular:  Negative for chest pain and palpitations.  Gastrointestinal:  Negative for nausea and vomiting.  Musculoskeletal:  Positive for back pain.  Neurological:  Positive for numbness.     Objective:    BP (!) 96/52 (BP Location: Right Arm, Patient Position: Sitting, Cuff Size: Normal)    Pulse 62    Temp 98 F (36.7 C) (Oral)    Ht 5\' 5"  (1.651  m)    Wt 116 lb (52.6 kg)    SpO2 97%    BMI 19.30 kg/m  BP Readings from Last 3 Encounters:  12/23/21 (!) 96/52  12/22/21 97/68  12/21/21 112/70   Wt Readings from Last 3 Encounters:  12/23/21 116 lb (52.6 kg)  12/21/21 116 lb (52.6 kg)  12/15/21 115 lb (52.2 kg)    Physical Exam Vitals reviewed.  Constitutional:      Appearance: She is well-developed.  Eyes:     Conjunctiva/sclera: Conjunctivae normal.  Cardiovascular:     Rate and Rhythm: Normal rate and regular rhythm.     Pulses: Normal pulses.     Heart sounds: Normal heart sounds.  Pulmonary:     Effort: Pulmonary effort is normal.     Breath sounds: Normal breath sounds. No wheezing, rhonchi or rales.  Skin:    General: Skin is warm and dry.  Neurological:     Mental Status: She is alert.  Psychiatric:        Speech: Speech normal.        Behavior: Behavior normal.        Thought Content: Thought content normal.       Assessment & Plan:   Problem List Items Addressed This Visit       Other   Low back pain - Primary    Chronic, stable.  Advised to increase Lyrica to 150 mg twice daily.  We agreed taking a lower dose of tramadol more frequently may be more beneficial for managing pain as patient  weans off of oxycodone.  In a couple days after she is adjusted to Lyrica 150 mg twice daily, if needed she will then start tramadol 50 mg every 8 hours as needed.  She will continue to wean from oxycodone.  She will let me know if she were to feel overly sedated on this regimen.  She will continue following with Dr. Sharlet Salina next for low back intervention as scheduled.  Blood pressure ongoing today.  Advised patient to continue to monitor blood pressure. close follow-up in 2 weeks.  I looked up patient on [place] Controlled Substances Reporting System PMP AWARE and saw no activity that raised concern of inappropriate use.        Relevant Medications   pregabalin (LYRICA) 150 MG capsule   traMADol (ULTRAM) 50 MG tablet     I have discontinued Nela G. Macfadden "[name]"'s metoprolol tartrate and pregabalin. I have also changed her traMADol. Additionally, I am having her start on pregabalin. Lastly, I am having her maintain her Multiple Vitamins-Minerals (MULTIVITAL PO), Lactobacillus (PROBIOTIC ACIDOPHILUS PO), traZODone, lidocaine-prilocaine, rosuvastatin, nicotine, nicotine polacrilex, LORazepam, trimethoprim, ondansetron, anastrozole, cyanocobalamin, tiZANidine, oxybutynin, FLUoxetine, and Oxycodone HCl.   Meds ordered this encounter  Medications   pregabalin (LYRICA) 150 MG capsule    Sig: Take 1 capsule (150 mg total) by mouth 2 (two) times daily.    Dispense:  60 capsule    Refill:  1    Order Specific Question:   Supervising Provider    Answer:   Deborra Medina L [[id number]]   traMADol (ULTRAM) 50 MG tablet    Sig: Take 1 tablet (50 mg total) by mouth every 8 (eight) hours as needed for up to 7 days.    Dispense:  90 tablet    Refill:  0    Order Specific Question:   Supervising Provider    Answer:   Crecencio Mc [[id number]]    Return precautions given.  Risks, benefits, and alternatives of the medications and treatment plan prescribed today were discussed, and patient expressed  understanding.   Education regarding symptom management and diagnosis given to patient on AVS.  Continue to follow with Burnard Hawthorne, FNP for routine health maintenance.   Alden [place] Cervantez and I agreed with plan.   Mable Paris, FNP

## 2021-12-23 NOTE — Assessment & Plan Note (Addendum)
Chronic, stable.  Advised to increase Lyrica to 150 mg twice daily.  We agreed taking a lower dose of tramadol more frequently may be more beneficial for managing pain as patient weans off of oxycodone.  In a couple days after she is adjusted to Lyrica 150 mg twice daily, if needed she will then start tramadol 50 mg every 8 hours as needed.  She will continue to wean from oxycodone.  She will let me know if she were to feel overly sedated on this regimen.  She will continue following with Dr. Sharlet Salina next for low back intervention as scheduled.  Blood pressure ongoing today.  Advised patient to continue to monitor blood pressure. close follow-up in 2 weeks.  ?I looked up patient on [place] Controlled Substances Reporting System PMP AWARE and saw no activity that raised concern of inappropriate use.  ? ?

## 2021-12-23 NOTE — Patient Instructions (Signed)
As discussed, please increase Lyrica to 150 mg twice per day.  After you have done this for couple days and if not completely effective, then you may increase tramadol to taking 50 mg every 8 hours.  Please ensure you are not taking tramadol at the same time as oxycodone as prescribed by Dr. Janese Banks.  I want you to be safe and do not want to escalate regimen too quickly ?

## 2021-12-26 ENCOUNTER — Ambulatory Visit: Payer: No Typology Code available for payment source | Admitting: Cardiology

## 2021-12-26 ENCOUNTER — Other Ambulatory Visit: Payer: Self-pay

## 2021-12-28 ENCOUNTER — Other Ambulatory Visit: Payer: Self-pay

## 2021-12-28 ENCOUNTER — Other Ambulatory Visit: Payer: Self-pay | Admitting: Oncology

## 2021-12-28 ENCOUNTER — Encounter: Payer: Self-pay | Admitting: Physical Therapy

## 2021-12-28 ENCOUNTER — Ambulatory Visit: Payer: No Typology Code available for payment source | Attending: Surgery | Admitting: Physical Therapy

## 2021-12-28 DIAGNOSIS — M25612 Stiffness of left shoulder, not elsewhere classified: Secondary | ICD-10-CM | POA: Insufficient documentation

## 2021-12-28 DIAGNOSIS — R293 Abnormal posture: Secondary | ICD-10-CM | POA: Diagnosis present

## 2021-12-28 DIAGNOSIS — Z170 Estrogen receptor positive status [ER+]: Secondary | ICD-10-CM | POA: Diagnosis present

## 2021-12-28 DIAGNOSIS — Z483 Aftercare following surgery for neoplasm: Secondary | ICD-10-CM | POA: Diagnosis present

## 2021-12-28 DIAGNOSIS — C50912 Malignant neoplasm of unspecified site of left female breast: Secondary | ICD-10-CM | POA: Insufficient documentation

## 2021-12-28 DIAGNOSIS — L599 Disorder of the skin and subcutaneous tissue related to radiation, unspecified: Secondary | ICD-10-CM | POA: Insufficient documentation

## 2021-12-28 MED ORDER — LORAZEPAM 0.5 MG PO TABS
0.5000 mg | ORAL_TABLET | Freq: Every evening | ORAL | 0 refills | Status: DC | PRN
  Filled 2021-12-28: qty 30, 30d supply, fill #0

## 2021-12-28 NOTE — Therapy (Signed)
[place] [facility] @ [address] [place], [place], [address] Phone: [phone]   Fax:  [phone]  Physical Therapy Treatment  Patient Details  Name: Tricia Berry MRN: [id number] Date of Birth: 1971/05/27 Referring Provider (PT): Burnard Hawthorne, FNP   Encounter Date: 12/28/2021   PT End of Session - 12/28/21 0910     Visit Number 22    Number of Visits 22    Date for PT Re-Evaluation 12/29/21    Authorization Type [place] FOCUS reporting period from 08/25/2021    PT Start Time 0909    PT Stop Time 0951    PT Time Calculation (min) 42 min    Activity Tolerance Patient tolerated treatment well    Behavior During Therapy [facility] for tasks assessed/performed             Past Medical History:  Diagnosis Date   Anxiety    Breast cancer ([place])    Diverticulitis    Family history of ovarian cancer    GERD (gastroesophageal reflux disease)    IBS (irritable bowel syndrome)    PONV (postoperative nausea and vomiting)    severe migraine and vomiting post anesthesia   Scoliosis     Past Surgical History:  Procedure Laterality Date   ABDOMINAL HYSTERECTOMY     still has ovaries, no gyn cancer, hysterectomy due to endometriosis. NO cervix on exam 01/10/21   BREAST BIOPSY Left 09/15/2020   [place] bx of mass, path pending, Q marker   BREAST BIOPSY Left 09/15/2020   [place] bx of LN, hydromarker, path pending   BREAST RECONSTRUCTION WITH PLACEMENT OF TISSUE EXPANDER AND FLEX HD (ACELLULAR HYDRATED DERMIS) Bilateral 03/17/2021   Procedure: IMMEDIATE BILATERAL BREAST RECONSTRUCTION WITH PLACEMENT OF TISSUE EXPANDER AND FLEX HD (ACELLULAR HYDRATED DERMIS);  Surgeon: Wallace Going, DO;  Location: [place];  Service: Plastics;  Laterality: Bilateral;   COLONOSCOPY WITH PROPOFOL N/A 11/21/2021   Procedure: COLONOSCOPY WITH PROPOFOL;  Surgeon: Lin Landsman, MD;  Location[facility] ENDOSCOPY;  Service:  Gastroenterology;  Laterality: N/A;   IR IMAGING GUIDED PORT INSERTION  10/01/2020   MODIFIED MASTECTOMY Left 03/17/2021   Procedure: LEFT MODIFIED RADICAL MASTECTOMY;  Surgeon: Erroll Luna, MD;  Location: [place];  Service: General;  Laterality: Left;   PORTA CATH REMOVAL Right 03/17/2021   Procedure: PORTA CATH REMOVAL;  Surgeon: Erroll Luna, MD;  Location: [place];  Service: General;  Laterality: Right;   REMOVAL OF BILATERAL TISSUE EXPANDERS WITH PLACEMENT OF BILATERAL BREAST IMPLANTS Bilateral 05/09/2021   Procedure: REMOVAL OF BILATERAL TISSUE EXPANDERS WITH PLACEMENT OF BILATERAL BREAST IMPLANTS;  Surgeon: Wallace Going, DO;  Location: [place];  Service: Plastics;  Laterality: Bilateral;  90 min   TOTAL MASTECTOMY Right 03/17/2021   Procedure: TOTAL MASTECTOMY;  Surgeon: Erroll Luna, MD;  Location: [place];  Service: General;  Laterality: Right;    There were no vitals filed for this visit.   Subjective Assessment - 12/28/21 0910     Subjective When I stretch my arm it feels better.    Pertinent History L breast cancer ER+/PR+ HER2-, stage III, underwent a bilateral mastectomy and ALND on L (7/16) on 03/17/21, reconstruction in summer 2022, neuropathy of hands, lower legs and feet, completed radiation, completed radiation Dec17, 2021 to February 18, 2021    Patient Stated Goals to gain info from providers    Currently in Pain? No/denies  Pain Score 0-No pain                               [facility] Adult PT Treatment/Exercise - 12/28/21 0001       Manual Therapy   Myofascial Release MFR done to 1 very thick cord which was palpable in L axilla ending down into breast and upwards in to upper arm                          PT Long Term Goals - 12/28/21 1008       PT LONG TERM GOAL #1   Title Pt will return to baseline shoulder ROM measurements and not demonstrate  any signs or symptoms of lymphedema.    Baseline 05/31/21- pt has 180 degrees of flex and abd bilaterally    Time 4    Period Weeks    Status Achieved      PT LONG TERM GOAL #2   Title Pt will demonstrate 165 degrees of bilateral flexion to allow pt to reach overhead.    Baseline R 143 L 101; 05/02/21- 172 on R, 175 on L    Time 4    Period Weeks    Status Achieved      PT LONG TERM GOAL #3   Title Pt will demonstrate 165 degrees of bilateral shoulder abduction to allow her to reach out the side    Baseline R 152 L 78; 05/02/21- R 180 L 179    Time 4    Period Weeks    Status Achieved      PT LONG TERM GOAL #4   Title Pt will report she is no longer having muscle spasms across her chest to allow improved comfort    Baseline 05/02/21- pt reports she is no longer having muscle spasms    Time 4    Period Weeks      PT LONG TERM GOAL #5   Title Pt will be independent in a home exercise program for long term strengthening and stretching.    Baseline 12/01/21- will instruct pt in Strength ABC next week; 12/28/21- pt is now independent in strength ABC class    Time 4    Period Weeks    Status Achieved      PT LONG TERM GOAL #6   Title Pt will report a 50% improvement in edema in L trunk and chest to allow improved comfort.    Baseline 05/02/21- 20% improved; 05/31/21 no edema present    Time 4    Period Weeks    Status Achieved      PT LONG TERM GOAL #7   Title Pt will be independent in Strength ABC program for long term stretching and strengthening after completion of radiation.    Time 4    Period Weeks    Status Achieved      PT LONG TERM GOAL #8   Title Pt will demonstrate baseline ROM (180 degrees) of flexion and abduction of LUE to allow pt to return to prior level of function.    Baseline 160 flex, 164 abd; 09/29/21- flex- 160, abd - 156 both with pain from cording in axilla    Time 4    Period Weeks    Status Achieved                   Plan - 12/28/21 1014  Clinical Impression Statement Pt is now ready for discharge from skilled PT services. She has returned to full ROM. She is independent in the Strength ABC program and her home exercise program has been consolidated to help pt be more compliant and less overwhelmed with the number of exercises. She still has cording in the L axilla but this does not inhibit functional ability. Pt will be discharged from skilled PT services at this time but will continue with 3 month L dex screens for subclinical lymphedema.    PT Frequency 1x / week    PT Duration 4 weeks    PT Treatment/Interventions ADLs/Self Care Home Management;Therapeutic exercise;Patient/family education;Scar mobilization;Passive range of motion;Manual techniques;Manual lymph drainage;Compression bandaging;Taping;Vasopneumatic Device    PT Next Visit Plan Cont every 3 month L-Dex screens for up to 2 years from her SLNB    PT Home Exercise Plan post op breast exercises, instructed pt in supine dowel, supine scap series, windshield wiper yoga ex, bow and arrow yoga ex, strength ABC    Consulted and Agree with Plan of Care Patient             Patient will benefit from skilled therapeutic intervention in order to improve the following deficits and impairments:  Pain, Postural dysfunction, Decreased knowledge of precautions, Impaired UE functional use, Increased fascial restricitons, Decreased strength, Decreased range of motion, Decreased scar mobility, Increased edema, Increased muscle spasms  Visit Diagnosis: Stiffness of left shoulder, not elsewhere classified  Disorder of the skin and subcutaneous tissue related to radiation, unspecified  Aftercare following surgery for neoplasm  Abnormal posture  Malignant 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, estrogen receptor positive, unspecified site of breast [facility])     Problem List Patient Active Problem List   Diagnosis Date Noted   Colon cancer screening    Family history of heart disease  11/16/2021   Acquired absence of breast 08/16/2021   Dysuria 08/05/2021   B12 deficiency 07/21/2021   Atherosclerosis of aorta ([place]) 07/20/2021   Hepatic steatosis 07/20/2021   Abdominal pain 07/18/2021   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[place]) 03/14/2021   Genetic testing 11/22/2020   Family history of ovarian cancer    Candidal vulvovaginitis 11/01/2020   Low back pain 10/06/2020   Encounter for medical examination to establish care 09/26/2020   Malignant neoplasm of upper-outer quadrant of left breast in female, estrogen receptor positive ([place]) 09/26/2020   BACK STRAIN, LUMBAR 03/08/2010   INTERNAL HEMORRHOIDS 01/27/2010   ANAL FISSURE 01/27/2010   OSTEOARTHRITIS, HANDS, BILATERAL 01/04/2010   ARTHRALGIA 10/05/2009   INSOMNIA, CHRONIC 05/06/2009   ALLERGIC RHINITIS, SEASONAL 02/09/2009   TOBACCO ABUSE 12/29/2008   IBS 10/13/2008   Anxiety state 08/25/2008   DEPRESSION, RECURRENT 08/25/2008    Manus Gunning, PT 12/28/2021, 10:17 AM  [place] @ [place], [place], [address] Phone: [phone]   Fax:  [phone]  Name: MERSADIE KAVANAUGH MRN: [id number] Date of Birth: [birth year]   PHYSICAL THERAPY DISCHARGE SUMMARY  Visits from Start of Care: 22  Current functional level related to goals / functional outcomes: All goals met   Remaining deficits: One cord remaining in L axilla but does not inhibit function   Education / Equipment: HEP   Patient agrees to discharge. Patient goals were met. Patient is being discharged due to meeting the stated rehab goals.  Allyson Sabal [place], [place] 12/28/21 10:17 AM

## 2021-12-28 NOTE — Telephone Encounter (Signed)
Late entry from 12/23/21. ? ?Called patient and left a detailed VM per DPR on file with CCTA results and recommendation to cancel follow up due to normal results. Also sent the following message through MyChart: ? ? ?Hi Nini, ?  ?Your CCTA was reviewed by Dr. Garen Lah and he stated that it was Normal coronary CTA, no evidence for CAD. We can cancel your appointment on Monday 12/26/21 and have you just follow up as needed since everything was normal. I did leave you a VM. Please call our office or respond to my message here and let us know you received it so I can cancel your appointment for you. ?  ?Sincerely, ?  ?Finnlee Silvernail RN ?

## 2021-12-29 ENCOUNTER — Other Ambulatory Visit: Payer: Self-pay

## 2021-12-29 ENCOUNTER — Ambulatory Visit (INDEPENDENT_AMBULATORY_CARE_PROVIDER_SITE_OTHER): Payer: No Typology Code available for payment source

## 2021-12-29 ENCOUNTER — Other Ambulatory Visit: Payer: No Typology Code available for payment source

## 2021-12-29 DIAGNOSIS — Z23 Encounter for immunization: Secondary | ICD-10-CM

## 2021-12-29 DIAGNOSIS — I700 Atherosclerosis of aorta: Secondary | ICD-10-CM | POA: Diagnosis not present

## 2021-12-29 LAB — LIPID PANEL
Cholesterol: 166 mg/dL (ref 0–200)
HDL: 40.9 mg/dL (ref >39.00)
LDL Cholesterol: 85 mg/dL (ref 0–99)
NonHDL: 124.7
Total CHOL/HDL Ratio: 4
Triglycerides: 198 mg/dL — ABNORMAL HIGH (ref 0.0–149.0)
VLDL: 39.6 mg/dL (ref 0.0–40.0)

## 2021-12-29 LAB — BASIC METABOLIC PANEL
BUN: 10 mg/dL (ref 6–23)
CO2: 28 mEq/L (ref 19–32)
Calcium: 9.3 mg/dL (ref 8.4–10.5)
Chloride: 104 mEq/L (ref 96–112)
Creatinine, Ser: 0.98 mg/dL (ref 0.40–1.20)
GFR: 66.76 mL/min (ref >60.00)
Glucose, Bld: 93 mg/dL (ref 70–99)
Potassium: 4.2 mEq/L (ref 3.5–5.1)
Sodium: 139 mEq/L (ref 135–145)

## 2021-12-29 NOTE — Progress Notes (Signed)
Patient came in today for 1st Heplisav-B injection given in right deltoid IM. Patient tolerated well with no signs of distress.  ?

## 2021-12-31 ENCOUNTER — Encounter: Payer: Self-pay | Admitting: Family

## 2022-01-04 ENCOUNTER — Ambulatory Visit: Payer: No Typology Code available for payment source | Admitting: Physical Therapy

## 2022-01-05 ENCOUNTER — Telehealth: Payer: Self-pay

## 2022-01-05 NOTE — Telephone Encounter (Signed)
Called patient to do Pre charting but was unsuccessful, but did leave message for patient to call back if within the next 30 mins ?

## 2022-01-06 ENCOUNTER — Other Ambulatory Visit: Payer: Self-pay

## 2022-01-06 ENCOUNTER — Encounter: Payer: Self-pay | Admitting: Oncology

## 2022-01-06 ENCOUNTER — Encounter: Payer: Self-pay | Admitting: Family

## 2022-01-06 ENCOUNTER — Ambulatory Visit (INDEPENDENT_AMBULATORY_CARE_PROVIDER_SITE_OTHER): Payer: No Typology Code available for payment source | Admitting: Family

## 2022-01-06 VITALS — BP 122/76 | HR 66 | Temp 98.7°F | Ht 60.0 in | Wt 118.2 lb

## 2022-01-06 DIAGNOSIS — M545 Low back pain, unspecified: Secondary | ICD-10-CM

## 2022-01-06 MED ORDER — PREGABALIN 150 MG PO CAPS
150.0000 mg | ORAL_CAPSULE | Freq: Three times a day (TID) | ORAL | 1 refills | Status: DC
  Filled 2022-01-06 – 2022-01-20 (×2): qty 90, 30d supply, fill #0
  Filled 2022-02-27: qty 90, 30d supply, fill #1

## 2022-01-06 NOTE — Assessment & Plan Note (Addendum)
Chronic, largely unchanged.  She is awaiting a nerve block with physiatry.  She is not weaning any further from oxycodone and has not been taking tramadol as it made her head feel funny and she was unable to sleep on medication.  She does have a history of restless leg syndrome.  We discussed increasing Lyrica and she may gradually introduce tramadol to see if side effects are less bothersome. Counseled her that risk of serotonin syndrome are low when gradually increasing tramadol.  We also discussed increasing Prozac at follow-up.  We also agreed on referral to pain management for further discussion about medications and intervention.  additionally, have sent a message to Dr. Janese Banks, hematology oncology to ask if consideration for repeat PET scan would be warranted at any point. ?

## 2022-01-06 NOTE — Patient Instructions (Signed)
Referral to pain management ?Let us know if you dont hear back within a week in regards to an appointment being scheduled.  ? ?Increase Lyrica to 150 mg taken 3 tabs per day.  This is the maximum dose ?Trial taking tramadol 50mg  in the morning with prozac 20mg . Do not take oxycodone at same time as tramadol ? ? ?

## 2022-01-06 NOTE — Progress Notes (Signed)
? ?Subjective:  ? ? Patient ID: Tricia Berry, female    DOB: January 16, 1971, 51 y.o.   MRN: [id number] ? ?CC: Tricia Berry is a 51 y.o. female who presents today for follow up.  ? ?HPI: Follow-up low back pain ?Low back is unchanged. There are good days and bad days.  ?No unusual weight loss, fever, chills.  ? ? ? ?Increased Lyrica to 150 mg BID  She hasnt been taking Tramadol 50mg  as causes her to 'head to funny' and unable to sleep, feeling 'more restless'.  ? ?She has returned to using oxycodone as was also worried about taking prozac  20mg  and tramadol.  ? ?follow-up physiatry 01/10/2022 ?She had L3 and L4 medial branch and L5 dorsal blocks performed 12/30/2021 ? ?HISTORY:  ?Past Medical History:  ?Diagnosis Date  ? Anxiety   ? Breast cancer ([place])   ? Diverticulitis   ? Family history of ovarian cancer   ? GERD (gastroesophageal reflux disease)   ? IBS (irritable bowel syndrome)   ? PONV (postoperative nausea and vomiting)   ? severe migraine and vomiting post anesthesia  ? Scoliosis   ? ?Past Surgical History:  ?Procedure Laterality Date  ? ABDOMINAL HYSTERECTOMY    ? still has ovaries, no gyn cancer, hysterectomy due to endometriosis. NO cervix on exam 01/10/21  ? BREAST BIOPSY Left 09/15/2020  ? [place] bx of mass, path pending, Q marker  ? BREAST BIOPSY Left 09/15/2020  ? [place] bx of LN, hydromarker, path pending  ? BREAST RECONSTRUCTION WITH PLACEMENT OF TISSUE EXPANDER AND FLEX HD (ACELLULAR HYDRATED DERMIS) Bilateral 03/17/2021  ? Procedure: IMMEDIATE BILATERAL BREAST RECONSTRUCTION WITH PLACEMENT OF TISSUE EXPANDER AND FLEX HD (ACELLULAR HYDRATED DERMIS);  Surgeon: Wallace Going, DO;  Location: [place];  Service: Plastics;  Laterality: Bilateral;  ? COLONOSCOPY WITH PROPOFOL N/A 11/21/2021  ? Procedure: COLONOSCOPY WITH PROPOFOL;  Surgeon: Lin Landsman, MD;  Location: [facility]COPY;  Service: Gastroenterology;  Laterality: N/A;  ? IR IMAGING GUIDED PORT INSERTION  10/01/2020   ? MODIFIED MASTECTOMY Left 03/17/2021  ? Procedure: LEFT MODIFIED RADICAL MASTECTOMY;  Surgeon: Erroll Luna, MD;  Location: [place];  Service: General;  Laterality: Left;  ? PORTA CATH REMOVAL Right 03/17/2021  ? Procedure: PORTA CATH REMOVAL;  Surgeon: Erroll Luna, MD;  Location: [place];  Service: General;  Laterality: Right;  ? REMOVAL OF BILATERAL TISSUE EXPANDERS WITH PLACEMENT OF BILATERAL BREAST IMPLANTS Bilateral 05/09/2021  ? Procedure: REMOVAL OF BILATERAL TISSUE EXPANDERS WITH PLACEMENT OF BILATERAL BREAST IMPLANTS;  Surgeon: Wallace Going, DO;  Location: [place];  Service: Plastics;  Laterality: Bilateral;  90 min  ? TOTAL MASTECTOMY Right 03/17/2021  ? Procedure: TOTAL MASTECTOMY;  Surgeon: Erroll Luna, MD;  Location: [place];  Service: General;  Laterality: Right;  ? ?Family History  ?Problem Relation Age of Onset  ? Hypertension Mother   ? Osteoarthritis Mother   ? Diverticulitis Mother   ? Heart failure Father   ? Hypertension Father   ? Gout Father   ? Heart attack Father 40  ? Diverticulitis Brother   ? Ovarian cancer Paternal Grandmother   ? ? ?Allergies: Morphine, Sertraline hcl, Sulfa antibiotics, Sulfamethoxazole, and Sulfonamide derivatives ?Current Outpatient Medications on File Prior to Visit  ?Medication Sig Dispense Refill  ? anastrozole (ARIMIDEX) 1 MG tablet Take 1 tablet (1 mg total) by mouth daily. 30 tablet 3  ? cyanocobalamin (,VITAMIN B-12,) 1000 MCG/ML  injection 1000 mcg (1 mL) intramuscular injection in the thigh ( vastus lateralis) once per month. 3 mL 4  ? FLUoxetine (PROZAC) 20 MG capsule Take 1 capsule (20 mg total) by mouth every morning. 90 capsule 3  ? Lactobacillus (PROBIOTIC ACIDOPHILUS PO) Take 1 capsule by mouth daily.    ? lidocaine-prilocaine (EMLA) cream Apply 1 application topically as needed. 30 g 0  ? LORazepam (ATIVAN) 0.5 MG tablet Take 1 tablet (0.5 mg total) by mouth at  bedtime as needed for up to 30 doses for anxiety. 30 tablet 0  ? Multiple Vitamins-Minerals (MULTIVITAL PO) Take 1 Dose by mouth daily.    ? nicotine (NICODERM CQ - DOSED IN MG/24 HOURS) 21 mg/24hr patch use 1 patch daily 28 patch 0  ? nicotine polacrilex (SM NICOTINE) 2 MG lozenge Take by mouth. 72 lozenge 0  ? ondansetron (ZOFRAN) 8 MG tablet Take 1 tablet (8 mg total) by mouth every 8 (eight) hours as needed for nausea or vomiting. 60 tablet 0  ? oxybutynin (DITROPAN-XL) 10 MG 24 hr tablet Take 1 tablet (10 mg total) by mouth daily. 30 tablet 11  ? Oxycodone HCl 10 MG TABS Take 1 tablet (10 mg total) by mouth every 8 (eight) hours as needed. 90 tablet 0  ? rosuvastatin (CRESTOR) 5 MG tablet Take 1 tablet (5 mg total) by mouth every evening. 90 tablet 3  ? tiZANidine (ZANAFLEX) 4 MG tablet Take 1 tablet (4 mg total) by mouth 3 (three) times daily. 30 tablet 0  ? traMADol (ULTRAM) 50 MG tablet Take 1 tablet (50 mg total) by mouth every 8 (eight) hours as needed for up to 7 days. 90 tablet 0  ? traZODone (DESYREL) 50 MG tablet Take 0.5-1 tablets (25-50 mg total) by mouth at bedtime as needed for sleep. 30 tablet 3  ? trimethoprim (TRIMPEX) 100 MG tablet Take 1 tablet (100 mg total) by mouth daily. 30 tablet 5  ? ?Current Facility-Administered Medications on File Prior to Visit  ?Medication Dose Route Frequency Provider Last Rate Last Admin  ? goserelin (ZOLADEX) injection 3.6 mg  3.6 mg Subcutaneous Q28 days Sindy Guadeloupe, MD   3.6 mg at 05/04/21 1150  ? heparin lock flush 100 unit/mL  500 Units Intravenous Once Sindy Guadeloupe, MD      ? ? ?Social History  ? ?Tobacco Use  ? Smoking status: Every Day  ?  Packs/day: 0.25  ?  Types: Cigarettes  ? Smokeless tobacco: Never  ? Tobacco comments:  ?  Pt trying to quit now. Has reduced amount significantly  ?Vaping Use  ? Vaping Use: Never used  ?Substance Use Topics  ? Alcohol use: Not Currently  ? Drug use: No  ? ? ?Review of Systems  ?Constitutional:  Negative for chills  and fever.  ?Respiratory:  Negative for cough.   ?Cardiovascular:  Negative for chest pain and palpitations.  ?Gastrointestinal:  Negative for nausea and vomiting.  ?Musculoskeletal:  Positive for back pain.  ?Neurological:  Positive for numbness.  ?Psychiatric/Behavioral:  Positive for sleep disturbance.   ?   ?Objective:  ?  ?BP 122/76 (BP Location: Right Arm, Patient Position: Sitting, Cuff Size: Normal)   Pulse 66   Temp 98.7 ?F (37.1 ?C) (Oral)   Ht 5' (1.524 m)   Wt 118 lb 3.2 oz (53.6 kg)   SpO2 97%   BMI 23.08 kg/m?  ?BP Readings from Last 3 Encounters:  ?01/06/22 122/76  ?12/23/21 (!) 96/52  ?12/22/21 97/68  ? ?  Wt Readings from Last 3 Encounters:  ?01/06/22 118 lb 3.2 oz (53.6 kg)  ?12/23/21 116 lb (52.6 kg)  ?12/21/21 116 lb (52.6 kg)  ? ? ?Physical Exam ?Vitals reviewed.  ?Constitutional:   ?   Appearance: She is well-developed.  ?Eyes:  ?   Conjunctiva/sclera: Conjunctivae normal.  ?Cardiovascular:  ?   Rate and Rhythm: Normal rate and regular rhythm.  ?   Pulses: Normal pulses.  ?   Heart sounds: Normal heart sounds.  ?Pulmonary:  ?   Effort: Pulmonary effort is normal.  ?   Breath sounds: Normal breath sounds. No wheezing, rhonchi or rales.  ?Skin: ?   General: Skin is warm and dry.  ?Neurological:  ?   Mental Status: She is alert.  ?Psychiatric:     ?   Speech: Speech normal.     ?   Behavior: Behavior normal.     ?   Thought Content: Thought content normal.  ? ? ?   ?Assessment & Plan:  ? ?Problem List Items Addressed This Visit   ? ?  ? Other  ? Low back pain - Primary  ?  Chronic, largely unchanged.  She is awaiting a nerve block with physiatry.  She is not weaning any further from oxycodone and has not been taking tramadol as it made her head feel funny and she was unable to sleep on medication.  She does have a history of restless leg syndrome.  We discussed increasing Lyrica and she may gradually introduce tramadol to see if side effects are less bothersome. Counseled her that risk of  serotonin syndrome are low when gradually increasing tramadol.  We also discussed increasing Prozac at follow-up.  We also agreed on referral to pain management for further discussion about medications and interv

## 2022-01-10 ENCOUNTER — Other Ambulatory Visit: Payer: Self-pay

## 2022-01-11 ENCOUNTER — Other Ambulatory Visit: Payer: Self-pay

## 2022-01-11 ENCOUNTER — Inpatient Hospital Stay: Payer: No Typology Code available for payment source

## 2022-01-11 ENCOUNTER — Ambulatory Visit (INDEPENDENT_AMBULATORY_CARE_PROVIDER_SITE_OTHER): Payer: No Typology Code available for payment source | Admitting: Gastroenterology

## 2022-01-11 ENCOUNTER — Other Ambulatory Visit: Payer: Self-pay | Admitting: Oncology

## 2022-01-11 ENCOUNTER — Encounter: Payer: Self-pay | Admitting: Gastroenterology

## 2022-01-11 VITALS — BP 95/69 | HR 65 | Temp 96.9°F | Resp 18 | Wt 119.3 lb

## 2022-01-11 VITALS — BP 111/72 | HR 66 | Temp 98.4°F | Ht 60.0 in | Wt 120.0 lb

## 2022-01-11 DIAGNOSIS — Z5112 Encounter for antineoplastic immunotherapy: Secondary | ICD-10-CM | POA: Diagnosis not present

## 2022-01-11 DIAGNOSIS — R1031 Right lower quadrant pain: Secondary | ICD-10-CM | POA: Diagnosis not present

## 2022-01-11 DIAGNOSIS — C50412 Malignant neoplasm of upper-outer quadrant of left female breast: Secondary | ICD-10-CM

## 2022-01-11 MED ORDER — PERTUZ-TRASTUZ-HYALURON-ZZXF 60-60-2000 MG-MG-U/ML CHEMO ~~LOC~~ SOLN
10.0000 mL | Freq: Once | SUBCUTANEOUS | Status: AC
  Administered 2022-01-11: 10 mL via SUBCUTANEOUS
  Filled 2022-01-11: qty 10

## 2022-01-11 MED ORDER — GOSERELIN ACETATE 3.6 MG ~~LOC~~ IMPL
3.6000 mg | DRUG_IMPLANT | SUBCUTANEOUS | Status: DC
  Administered 2022-01-11: 3.6 mg via SUBCUTANEOUS
  Filled 2022-01-11: qty 3.6

## 2022-01-11 MED ORDER — ACETAMINOPHEN 325 MG PO TABS
650.0000 mg | ORAL_TABLET | Freq: Once | ORAL | Status: AC
  Administered 2022-01-11: 650 mg via ORAL
  Filled 2022-01-11: qty 2

## 2022-01-11 MED ORDER — DIPHENHYDRAMINE HCL 25 MG PO CAPS
50.0000 mg | ORAL_CAPSULE | Freq: Once | ORAL | Status: AC
  Administered 2022-01-11: 50 mg via ORAL
  Filled 2022-01-11: qty 2

## 2022-01-11 NOTE — Progress Notes (Signed)
?  ?Cephas Darby, MD ?[address]  ?[address]  ?[place], [place] [address]  ?Main: [phone]  ?Fax: [phone] ? ? ? ?Gastroenterology Consultation ? ?Referring Provider:     Burnard Hawthorne, FNP ?Primary Care Physician:  Burnard Hawthorne, FNP ?Primary Gastroenterologist:  Dr. Bonna Gains ?Reason for Consultation:     History of diverticulitis ?      ? HPI:   ?Tricia Berry is a 51 y.o. female referred by Dr. Burnard Hawthorne, FNP  for consultation & management of right lower quadrant pain.  Patient underwent colonoscopy in 10/2021, which revealed severe left-sided diverticulosis only.  Patient reports that her abdominal pain has resolved.  She does not have any GI symptoms today. ? ?NSAIDs: None ? ?Antiplts/Anticoagulants/Anti thrombotics: None ? ?GI Procedures:  ?Colonoscopy 11/21/2021 ?- The examined portion of the ileum was normal. ?- Severe diverticulosis in the recto-sigmoid colon, in the sigmoid colon and in the descending colon. There was ?no evidence of diverticular bleeding. ?- The distal rectum and anal verge are normal on retroflexion view. ?- The examination was otherwise normal. ?- No specimens collected. ? ?Past Medical History:  ?Diagnosis Date  ?? Anxiety   ?? Breast cancer ([place])   ?? Diverticulitis   ?? Family history of ovarian cancer   ?? GERD (gastroesophageal reflux disease)   ?? IBS (irritable bowel syndrome)   ?? PONV (postoperative nausea and vomiting)   ? severe migraine and vomiting post anesthesia  ?? Scoliosis   ? ? ?Past Surgical History:  ?Procedure Laterality Date  ?? ABDOMINAL HYSTERECTOMY    ? still has ovaries, no gyn cancer, hysterectomy due to endometriosis. NO cervix on exam 01/10/21  ?? BREAST BIOPSY Left 09/15/2020  ? [place] bx of mass, path pending, Q marker  ?? BREAST BIOPSY Left 09/15/2020  ? [place] bx of LN, hydromarker, path pending  ?? BREAST RECONSTRUCTION WITH PLACEMENT OF TISSUE EXPANDER AND FLEX HD (ACELLULAR HYDRATED DERMIS) Bilateral 03/17/2021  ?  Procedure: IMMEDIATE BILATERAL BREAST RECONSTRUCTION WITH PLACEMENT OF TISSUE EXPANDER AND FLEX HD (ACELLULAR HYDRATED DERMIS);  Surgeon: Wallace Going, DO;  Location: [place];  Service: Plastics;  Laterality: Bilateral;  ?? COLONOSCOPY WITH PROPOFOL N/A 11/21/2021  ? Procedure: COLONOSCOPY WITH PROPOFOL;  Surgeon: Lin Landsman, MD;  Location: [facility] ENDOSCOPY;  Service: Gastroenterology;  Laterality: N/A;  ?? IR IMAGING GUIDED PORT INSERTION  10/01/2020  ?? MODIFIED MASTECTOMY Left 03/17/2021  ? Procedure: LEFT MODIFIED RADICAL MASTECTOMY;  Surgeon: Erroll Luna, MD;  Location: [place];  Service: General;  Laterality: Left;  ?? PORTA CATH REMOVAL Right 03/17/2021  ? Procedure: PORTA CATH REMOVAL;  Surgeon: Erroll Luna, MD;  Location: [place];  Service: General;  Laterality: Right;  ?? REMOVAL OF BILATERAL TISSUE EXPANDERS WITH PLACEMENT OF BILATERAL BREAST IMPLANTS Bilateral 05/09/2021  ? Procedure: REMOVAL OF BILATERAL TISSUE EXPANDERS WITH PLACEMENT OF BILATERAL BREAST IMPLANTS;  Surgeon: Wallace Going, DO;  Location: [place];  Service: Plastics;  Laterality: Bilateral;  90 min  ?? TOTAL MASTECTOMY Right 03/17/2021  ? Procedure: TOTAL MASTECTOMY;  Surgeon: Erroll Luna, MD;  Location: [place];  Service: General;  Laterality: Right;  ? ? ?Current Outpatient Medications:  ??  anastrozole (ARIMIDEX) 1 MG tablet, Take 1 tablet (1 mg total) by mouth daily., Disp: 30 tablet, Rfl: 3 ??  cyanocobalamin (,VITAMIN B-12,) 1000 MCG/ML injection, 1000 mcg (1 mL) intramuscular injection in the thigh ( vastus lateralis) once  per month., Disp: 3 mL, Rfl: 4 ??  FLUoxetine (PROZAC) 20 MG capsule, Take 1 capsule (20 mg total) by mouth every morning., Disp: 90 capsule, Rfl: 3 ??  Lactobacillus (PROBIOTIC ACIDOPHILUS PO), Take 1 capsule by mouth daily., Disp: , Rfl:  ??  lidocaine-prilocaine (EMLA) cream, Apply 1  application topically as needed., Disp: 30 g, Rfl: 0 ??  LORazepam (ATIVAN) 0.5 MG tablet, Take 1 tablet (0.5 mg total) by mouth at bedtime as needed for up to 30 doses for anxiety., Disp: 30 tablet, Rfl: 0 ??  Multiple Vitamins-Minerals (MULTIVITAL PO), Take 1 Dose by mouth daily., Disp: , Rfl:  ??  nicotine (NICODERM CQ - DOSED IN MG/24 HOURS) 21 mg/24hr patch, use 1 patch daily, Disp: 28 patch, Rfl: 0 ??  nicotine polacrilex (SM NICOTINE) 2 MG lozenge, Take by mouth., Disp: 72 lozenge, Rfl: 0 ??  ondansetron (ZOFRAN) 8 MG tablet, Take 1 tablet (8 mg total) by mouth every 8 (eight) hours as needed for nausea or vomiting., Disp: 60 tablet, Rfl: 0 ??  oxybutynin (DITROPAN-XL) 10 MG 24 hr tablet, Take 1 tablet (10 mg total) by mouth daily., Disp: 30 tablet, Rfl: 11 ??  Oxycodone HCl 10 MG TABS, Take 1 tablet (10 mg total) by mouth every 8 (eight) hours as needed., Disp: 90 tablet, Rfl: 0 ??  pregabalin (LYRICA) 150 MG capsule, Take 1 capsule (150 mg total) by mouth in the morning, at noon, and at bedtime., Disp: 90 capsule, Rfl: 1 ??  rosuvastatin (CRESTOR) 5 MG tablet, Take 1 tablet (5 mg total) by mouth every evening., Disp: 90 tablet, Rfl: 3 ??  tiZANidine (ZANAFLEX) 4 MG tablet, Take 1 tablet (4 mg total) by mouth 3 (three) times daily., Disp: 30 tablet, Rfl: 0 ??  traMADol (ULTRAM) 50 MG tablet, Take 1 tablet (50 mg total) by mouth every 8 (eight) hours as needed for up to 7 days., Disp: 90 tablet, Rfl: 0 ??  traZODone (DESYREL) 50 MG tablet, Take 0.5-1 tablets (25-50 mg total) by mouth at bedtime as needed for sleep., Disp: 30 tablet, Rfl: 3 ??  trimethoprim (TRIMPEX) 100 MG tablet, Take 1 tablet (100 mg total) by mouth daily., Disp: 30 tablet, Rfl: 5 ?No current facility-administered medications for this visit. ? ?Facility-Administered Medications Ordered in Other Visits:  ??  goserelin (ZOLADEX) injection 3.6 mg, 3.6 mg, Subcutaneous, Q28 days, Sindy Guadeloupe, MD, 3.6 mg at 05/04/21 1150 ??  goserelin  (ZOLADEX) injection 3.6 mg, 3.6 mg, Subcutaneous, Q28 days, Sindy Guadeloupe, MD, 3.6 mg at 01/11/22 1003 ??  heparin lock flush 100 unit/mL, 500 Units, Intravenous, Once, Sindy Guadeloupe, MD ? ?Family History  ?Problem Relation Age of Onset  ?? Hypertension Mother   ?? Osteoarthritis Mother   ?? Diverticulitis Mother   ?? Heart failure Father   ?? Hypertension Father   ?? Gout Father   ?? Heart attack Father 84  ?? Diverticulitis Brother   ?? Ovarian cancer Paternal Grandmother   ?  ? ?Social History  ? ?Tobacco Use  ?? Smoking status: Every Day  ?  Packs/day: 0.25  ?  Types: Cigarettes  ?? Smokeless tobacco: Never  ?? Tobacco comments:  ?  Pt trying to quit now. Has reduced amount significantly  ?Vaping Use  ?? Vaping Use: Never used  ?Substance Use Topics  ?? Alcohol use: Not Currently  ?? Drug use: No  ? ? ?Allergies as of 01/11/2022 - Review Complete 01/11/2022  ?Allergen Reaction Noted  ?? Morphine Nausea  And Vomiting and Other (See Comments) 08/25/2008  ?? Sertraline hcl    ?? Sulfa antibiotics Rash 04/26/2021  ?? Sulfamethoxazole Rash 12/15/2014  ?? Sulfonamide derivatives Rash 08/25/2008  ? ? ?Review of Systems:    ?All systems reviewed and negative except where noted in HPI. ? ? Physical Exam:  ?BP 111/72 (BP Location: Left Arm, Patient Position: Sitting, Cuff Size: Normal)   Pulse 66   Temp 98.4 ?F (36.9 ?C) (Oral)   Ht 5' (1.524 m)   Wt 120 lb (54.4 kg)   BMI 23.44 kg/m?  ?No LMP recorded. Patient has had a hysterectomy. ? ?General:   Alert,  Well-developed, well-nourished, pleasant and cooperative in NAD ?Head:  Normocephalic and atraumatic. ?Eyes:  Sclera clear, no icterus.   Conjunctiva pink. ?Ears:  Normal auditory acuity. ?Nose:  No deformity, discharge, or lesions. ?Mouth:  No deformity or lesions,oropharynx pink & moist. ?Neck:  Supple; no masses or thyromegaly. ?Lungs:  Respirations even and unlabored.  Clear throughout to auscultation.   No wheezes, crackles, or rhonchi. No acute  distress. ?Heart:  Regular rate and rhythm; no murmurs, clicks, rubs, or gallops. ?Abdomen:  Normal bowel sounds. Soft, non-tender and non-distended without masses, hepatosplenomegaly or hernias noted.  No guarding or rebound tenderness.

## 2022-01-16 ENCOUNTER — Other Ambulatory Visit: Payer: Self-pay

## 2022-01-16 ENCOUNTER — Ambulatory Visit: Payer: No Typology Code available for payment source

## 2022-01-16 ENCOUNTER — Other Ambulatory Visit: Payer: Self-pay | Admitting: Oncology

## 2022-01-16 DIAGNOSIS — Z483 Aftercare following surgery for neoplasm: Secondary | ICD-10-CM

## 2022-01-16 NOTE — Therapy (Signed)
?OUTPATIENT PHYSICAL THERAPY SOZO SCREENING NOTE ? ? ?Patient Name: Tricia Berry ?MRN: [id number] ?DOB:1970/12/24, 51 y.o., female ?Today's Date: 01/16/2022 ? ?PCP: Burnard Hawthorne, FNP ?REFERRING PROVIDER: Erroll Luna, MD ? ? PT End of Session - 01/16/22 1000   ? ? Visit Number 22   # unchanged due to screen only  ? PT Start Time 1314   ? PT Stop Time 1003   ? PT Time Calculation (min) 6 min   ? Activity Tolerance Patient tolerated treatment well   ? Behavior During Therapy [facility] for tasks assessed/performed   ? ?  ?  ? ?  ? ? ?Past Medical History:  ?Diagnosis Date  ? Anxiety   ? Breast cancer ([place])   ? Diverticulitis   ? Family history of ovarian cancer   ? GERD (gastroesophageal reflux disease)   ? IBS (irritable bowel syndrome)   ? PONV (postoperative nausea and vomiting)   ? severe migraine and vomiting post anesthesia  ? Scoliosis   ? ?Past Surgical History:  ?Procedure Laterality Date  ? ABDOMINAL HYSTERECTOMY    ? still has ovaries, no gyn cancer, hysterectomy due to endometriosis. NO cervix on exam 01/10/21  ? BREAST BIOPSY Left 09/15/2020  ? [place] bx of mass, path pending, Q marker  ? BREAST BIOPSY Left 09/15/2020  ? [place] bx of LN, hydromarker, path pending  ? BREAST RECONSTRUCTION WITH PLACEMENT OF TISSUE EXPANDER AND FLEX HD (ACELLULAR HYDRATED DERMIS) Bilateral 03/17/2021  ? Procedure: IMMEDIATE BILATERAL BREAST RECONSTRUCTION WITH PLACEMENT OF TISSUE EXPANDER AND FLEX HD (ACELLULAR HYDRATED DERMIS);  Surgeon: Wallace Going, DO;  Location: [place];  Service: Plastics;  Laterality: Bilateral;  ? COLONOSCOPY WITH PROPOFOL N/A 11/21/2021  ? Procedure: COLONOSCOPY WITH PROPOFOL;  Surgeon: Lin Landsman, MD;  Location: [name] ENDOSCOPY;  Service: Gastroenterology;  Laterality: N/A;  ? IR IMAGING GUIDED PORT INSERTION  10/01/2020  ? MODIFIED MASTECTOMY Left 03/17/2021  ? Procedure: LEFT MODIFIED RADICAL MASTECTOMY;  Surgeon: Erroll Luna, MD;  Location: [place];  Service: General;  Laterality: Left;  ? PORTA CATH REMOVAL Right 03/17/2021  ? Procedure: PORTA CATH REMOVAL;  Surgeon: Erroll Luna, MD;  Location: [place];  Service: General;  Laterality: Right;  ? REMOVAL OF BILATERAL TISSUE EXPANDERS WITH PLACEMENT OF BILATERAL BREAST IMPLANTS Bilateral 05/09/2021  ? Procedure: REMOVAL OF BILATERAL TISSUE EXPANDERS WITH PLACEMENT OF BILATERAL BREAST IMPLANTS;  Surgeon: Wallace Going, DO;  Location: [place];  Service: Plastics;  Laterality: Bilateral;  90 min  ? TOTAL MASTECTOMY Right 03/17/2021  ? Procedure: TOTAL MASTECTOMY;  Surgeon: Erroll Luna, MD;  Location: [place];  Service: General;  Laterality: Right;  ? ?Patient Active Problem List  ? Diagnosis Date Noted  ? Family history of heart disease 11/16/2021  ? Acquired absence of breast 08/16/2021  ? Atherosclerosis of aorta ([place]) 07/20/2021  ? Hepatic steatosis 07/20/2021  ? 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 Genetic testing 11/22/2020  ? Family history of ovarian cancer   ? Candidal vulvovaginitis 11/01/2020  ? Low back pain 10/06/2020  ? Encounter for medical examination to establish care 09/26/2020  ? Malignant neoplasm of upper-outer quadrant of left breast in female, estrogen receptor positive ([place]) 09/26/2020  ? BACK STRAIN, LUMBAR 03/08/2010  ? INTERNAL HEMORRHOIDS 01/27/2010  ? ANAL FISSURE 01/27/2010  ? OSTEOARTHRITIS, HANDS, BILATERAL 01/04/2010  ? ARTHRALGIA 10/05/2009  ? INSOMNIA, CHRONIC 05/06/2009  ? ALLERGIC RHINITIS, SEASONAL 02/09/2009  ? TOBACCO  ABUSE 12/29/2008  ? IBS 10/13/2008  ? Anxiety state 08/25/2008  ? DEPRESSION, RECURRENT 08/25/2008  ? ? ?REFERRING DIAG: left breast cancer at risk for lymphedema ? ?THERAPY DIAG:  ?Aftercare following surgery for neoplasm ? ?PERTINENT HISTORY: L breast cancer ER+/PR+ HER2-, stage III, underwent a bilateral mastectomy and ALND on L (7/16) on 03/17/21, reconstruction in summer  2022, neuropathy of hands, lower legs and feet, completed radiation, completed radiation Dec17, 2021 to February 18, 2021 ? ?PRECAUTIONS: left UE Lymphedema risk, None ? ?SUBJECTIVE: Pt returns for her 3 month L-Dex screen.  ? ?PAIN:  ?Are you having pain? No ? ?SOZO SCREENING: ?Patient was assessed today using the SOZO machine to determine the lymphedema index score. This was compared to her baseline score. It was determined that she is within the recommended range when compared to her baseline and no further action is needed at this time. She will continue SOZO screenings. These are done every 3 months for 2 years post operatively followed by every 6 months for 2 years, and then annually. ? ?Plan: Cont every 3 month L-Dex screens for up to 2 years from her ALND (~03/18/2023) ? ? ?Otelia Limes, PTA ?01/16/2022, 10:08 AM ? ?  ? ?

## 2022-01-17 ENCOUNTER — Other Ambulatory Visit: Payer: Self-pay

## 2022-01-18 ENCOUNTER — Encounter: Payer: Self-pay | Admitting: Family

## 2022-01-18 ENCOUNTER — Encounter: Payer: Self-pay | Admitting: Oncology

## 2022-01-18 ENCOUNTER — Other Ambulatory Visit: Payer: Self-pay

## 2022-01-19 ENCOUNTER — Telehealth: Payer: Self-pay | Admitting: Acute Care

## 2022-01-20 ENCOUNTER — Ambulatory Visit (INDEPENDENT_AMBULATORY_CARE_PROVIDER_SITE_OTHER): Payer: No Typology Code available for payment source | Admitting: Family

## 2022-01-20 ENCOUNTER — Other Ambulatory Visit: Payer: Self-pay

## 2022-01-20 ENCOUNTER — Encounter: Payer: Self-pay | Admitting: Family

## 2022-01-20 VITALS — BP 122/76 | HR 85 | Temp 98.3°F | Ht 60.0 in | Wt 115.8 lb

## 2022-01-20 DIAGNOSIS — M2550 Pain in unspecified joint: Secondary | ICD-10-CM

## 2022-01-20 DIAGNOSIS — M545 Low back pain, unspecified: Secondary | ICD-10-CM | POA: Diagnosis not present

## 2022-01-20 DIAGNOSIS — F411 Generalized anxiety disorder: Secondary | ICD-10-CM | POA: Diagnosis not present

## 2022-01-20 LAB — SEDIMENTATION RATE: Sed Rate: 26 mm/hr (ref 0–30)

## 2022-01-20 LAB — C-REACTIVE PROTEIN: CRP: <1 mg/dL (ref 0.5–20.0)

## 2022-01-20 MED ORDER — FLUOXETINE HCL 40 MG PO CAPS
40.0000 mg | ORAL_CAPSULE | Freq: Every morning | ORAL | 1 refills | Status: DC
  Filled 2022-01-20: qty 90, 90d supply, fill #0
  Filled 2022-04-17: qty 57, 57d supply, fill #1
  Filled 2022-04-17: qty 90, 90d supply, fill #1

## 2022-01-20 MED ORDER — PREDNISONE 10 MG PO TABS
ORAL_TABLET | ORAL | 0 refills | Status: DC
  Filled 2022-01-20: qty 10, 4d supply, fill #0

## 2022-01-20 NOTE — Assessment & Plan Note (Signed)
Uncontrolled particularly in the setting of chronic pain.  Increase Prozac to 40 mg.  Close follow-up ?

## 2022-01-20 NOTE — Progress Notes (Signed)
? ?Subjective:  ? ? Patient ID: Tricia Berry, female    DOB: August 25, 1971, 51 y.o.   MRN: [id number] ? ?CC: Tricia Berry is a 51 y.o. female who presents today for follow up.  ? ?HPI: Complains of 'all over joint pain'.  No particular joint is bothering her today.  She feels symptoms much more pronounced since she has stopped oxycodone. ? ?Complains of neck pain, right shoulder, hands hurt though not necessarily at the same time. Low back pain has improved with nerve block.  ? ?Endorses stiffness in the morning.  ?No wrist pain,  joint swelling , joint redness or rash,  ? 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 Symptoms are more apparent since off the oxycodone.  ? ?No recent prednisone use  ?She has been on prednisone in the past and tolerated medication well ? ? ? ? Dr Janese Banks didn't refill oxycodone and she is no longer on oxycodone as of 4 days ago.  ?Compliant with Lyrica 150 mg BID  ?Tramadol makes her 'head fell funny' and she is feels restless.  ? ?She is compliant with Prozac 20 mg and is interested in increasing this dose as she feels pain as well as abrupt  discontinuation of oxycodone have made her feel more anxious. ? ?01/17/2022 Dr Sharlet Salina placed nerve block ? ?Consult Dr. Marius Ditch 01/11/2022 for right side abdominal pain.  This pain has completely resolved.  No further work-up ?HISTORY:  ?Past Medical History:  ?Diagnosis Date  ? Anxiety   ? Breast cancer ([place])   ? Diverticulitis   ? Family history of ovarian cancer   ? GERD (gastroesophageal reflux disease)   ? IBS (irritable bowel syndrome)   ? PONV (postoperative nausea and vomiting)   ? severe migraine and vomiting post anesthesia  ? Scoliosis   ? ?Past Surgical History:  ?Procedure Laterality Date  ? ABDOMINAL HYSTERECTOMY    ? still has ovaries, no gyn cancer, hysterectomy due to endometriosis. NO cervix on exam 01/10/21  ? BREAST BIOPSY Left 09/15/2020  ? [place] bx of mass, path pending, Q marker  ? BREAST BIOPSY Left  09/15/2020  ? [place] bx of LN, hydromarker, path pending  ? BREAST RECONSTRUCTION WITH PLACEMENT OF TISSUE EXPANDER AND FLEX HD (ACELLULAR HYDRATED DERMIS) Bilateral 03/17/2021  ? Procedure: IMMEDIATE BILATERAL BREAST RECONSTRUCTION WITH PLACEMENT OF TISSUE EXPANDER AND FLEX HD (ACELLULAR HYDRATED DERMIS);  Surgeon: Wallace Going, DO;  Location: [place];  Service: Plastics;  Laterality: Bilateral;  ? COLONOSCOPY WITH PROPOFOL N/A 11/21/2021  ? Procedure: COLONOSCOPY WITH PROPOFOL;  Surgeon: Lin Landsman, MD;  Location: [facility] ENDOSCOPY;  Service: Gastroenterology;  Laterality: N/A;  ? IR IMAGING GUIDED PORT INSERTION  10/01/2020  ? MODIFIED MASTECTOMY Left 03/17/2021  ? Procedure: LEFT MODIFIED RADICAL MASTECTOMY;  Surgeon: Erroll Luna, MD;  Location: [place];  Service: General;  Laterality: Left;  ? PORTA CATH REMOVAL Right 03/17/2021  ? Procedure: PORTA CATH REMOVAL;  Surgeon: Erroll Luna, MD;  Location: [place];  Service: General;  Laterality: Right;  ? REMOVAL OF BILATERAL TISSUE EXPANDERS WITH PLACEMENT OF BILATERAL BREAST IMPLANTS Bilateral 05/09/2021  ? Procedure: REMOVAL OF BILATERAL TISSUE EXPANDERS WITH PLACEMENT OF BILATERAL BREAST IMPLANTS;  Surgeon: Wallace Going, DO;  Location: [place];  Service: Plastics;  Laterality: Bilateral;  90 min  ? TOTAL MASTECTOMY Right 03/17/2021  ? Procedure: TOTAL MASTECTOMY;  Surgeon: Erroll Luna, MD;  Location: [place];  Service: General;  Laterality: Right;  ? ?Family History  ?Problem Relation Age of Onset  ? Hypertension Mother   ? Osteoarthritis Mother   ? Diverticulitis Mother   ? Heart failure Father   ? Hypertension Father   ? Gout Father   ? Heart attack Father 56  ? Diverticulitis Brother   ? Ovarian cancer Paternal Grandmother   ? ? ?Allergies: Morphine, Sertraline hcl, Sulfa antibiotics, Sulfamethoxazole, and Sulfonamide derivatives ?Current  Outpatient Medications on File Prior to Visit  ?Medication Sig Dispense Refill  ? anastrozole (ARIMIDEX) 1 MG tablet Take 1 tablet (1 mg total) by mouth daily. 30 tablet 3  ? cyanocobalamin (,VITAMIN B-12,) 1000 MCG/ML injection 1000 mcg (1 mL) intramuscular injection in the thigh ( vastus lateralis) once per month. 3 mL 4  ? Lactobacillus (PROBIOTIC ACIDOPHILUS PO) Take 1 capsule by mouth daily.    ? lidocaine-prilocaine (EMLA) cream Apply 1 application topically as needed. 30 g 0  ? LORazepam (ATIVAN) 0.5 MG tablet Take 1 tablet (0.5 mg total) by mouth at bedtime as needed for up to 30 doses for anxiety. 30 tablet 0  ? Multiple Vitamins-Minerals (MULTIVITAL PO) Take 1 Dose by mouth daily.    ? nicotine (NICODERM CQ - DOSED IN MG/24 HOURS) 21 mg/24hr patch use 1 patch daily 28 patch 0  ? nicotine polacrilex (SM NICOTINE) 2 MG lozenge Take by mouth. 72 lozenge 0  ? ondansetron (ZOFRAN) 8 MG tablet Take 1 tablet (8 mg total) by mouth every 8 (eight) hours as needed for nausea or vomiting. 60 tablet 0  ? oxybutynin (DITROPAN-XL) 10 MG 24 hr tablet Take 1 tablet (10 mg total) by mouth daily. 30 tablet 11  ? Oxycodone HCl 10 MG TABS Take 1 tablet (10 mg total) by mouth every 8 (eight) hours as needed. 90 tablet 0  ? pregabalin (LYRICA) 150 MG capsule Take 1 capsule (150 mg total) by mouth in the morning, at noon, and at bedtime. 90 capsule 1  ? rosuvastatin (CRESTOR) 5 MG tablet Take 1 tablet (5 mg total) by mouth every evening. 90 tablet 3  ? tiZANidine (ZANAFLEX) 4 MG tablet Take 1 tablet (4 mg total) by mouth 3 (three) times daily. 30 tablet 0  ? traZODone (DESYREL) 50 MG tablet Take 0.5-1 tablets (25-50 mg total) by mouth at bedtime as needed for sleep. 30 tablet 3  ? trimethoprim (TRIMPEX) 100 MG tablet Take 1 tablet (100 mg total) by mouth daily. 30 tablet 5  ? ?Current Facility-Administered Medications on File Prior to Visit  ?Medication Dose Route Frequency Provider Last Rate Last Admin  ? goserelin (ZOLADEX)  injection 3.6 mg  3.6 mg Subcutaneous Q28 days Sindy Guadeloupe, MD   3.6 mg at 05/04/21 1150  ? heparin lock flush 100 unit/mL  500 Units Intravenous Once Sindy Guadeloupe, MD      ? ? ?Social History  ? ?Tobacco Use  ? Smoking status: Every Day  ?  Packs/day: 0.25  ?  Types: Cigarettes  ? Smokeless tobacco: Never  ? Tobacco comments:  ?  Pt trying to quit now. Has reduced amount significantly  ?Vaping Use  ? Vaping Use: Never used  ?Substance Use Topics  ? Alcohol use: Not Currently  ? Drug use: No  ? ? ?Review of Systems  ?Constitutional:  Negative for chills and fever.  ?Respiratory:  Negative for cough.   ?Cardiovascular:  Negative for chest pain and palpitations.  ?Gastrointestinal:  Negative for nausea and vomiting.  ?  Musculoskeletal:  Positive for arthralgias and back pain.  ?Neurological:  Positive for numbness.  ?   ?Objective:  ?  ?BP 122/76 (BP Location: Left Arm, Patient Position: Sitting, Cuff Size: Normal)   Pulse 85   Temp 98.3 ?F (36.8 ?C) (Oral)   Ht 5' (1.524 m)   Wt 115 lb 12.8 oz (52.5 kg)   SpO2 99%   BMI 22.62 kg/m?  ?BP Readings from Last 3 Encounters:  ?01/20/22 122/76  ?01/11/22 111/72  ?01/11/22 95/69  ? ?Wt Readings from Last 3 Encounters:  ?01/20/22 115 lb 12.8 oz (52.5 kg)  ?01/11/22 120 lb (54.4 kg)  ?01/11/22 119 lb 4.3 oz (54.1 kg)  ? ? ?Physical Exam ?Vitals reviewed.  ?Constitutional:   ?   Appearance: She is well-developed.  ?Eyes:  ?   Conjunctiva/sclera: Conjunctivae normal.  ?Cardiovascular:  ?   Rate and Rhythm: Normal rate and regular rhythm.  ?   Pulses: Normal pulses.  ?   Heart sounds: Normal heart sounds.  ?Pulmonary:  ?   Effort: Pulmonary effort is normal.  ?   Breath sounds: Normal breath sounds. No wheezing, rhonchi or rales.  ?Skin: ?   General: Skin is warm and dry.  ?Neurological:  ?   Mental Status: She is alert.  ?Psychiatric:     ?   Speech: Speech normal.     ?   Behavior: Behavior normal.     ?   Thought Content: Thought content normal.  ? ? ?   ?Assessment  & Plan:  ? ?Problem List Items Addressed This Visit   ? ?  ? Other  ? Anxiety state  ?  Uncontrolled particularly in the setting of chronic pain.  Increase Prozac to 40 mg.  Close follow-up ?  ?  ? Relevant Medications  ? [place]

## 2022-01-20 NOTE — Assessment & Plan Note (Signed)
Overall improved since recent nerve block, Dr. Sharlet Salina.  We will continue Lyrica 150 mg twice daily.  Tramadol caused side effects and she will no longer take tramadol; I have discontinued. ?

## 2022-01-20 NOTE — Patient Instructions (Addendum)
Start prednisone dose ? ?Increase prozac to 40mg  ? ?Continue lyrica 150mg  three times daily ? ?Please discuss with Dr. Janese Banks arthralgias and arimidex; as discussed, I am concerned your joint pain is related to side effects of arimidex-Arthralgia (15%; literature suggests incidence as high as 36%) ,back pain (10% to 12%) ? ?Let me know how you are doing ?

## 2022-01-20 NOTE — Assessment & Plan Note (Addendum)
Fortunately low back pain has improved.  However she is bothered intermittently by neck pain, right shoulder, and hands pain.  She politely declines dedicated imaging at this time.  We had a long discussion as it relates to my concern that joint pain is related to side effects of arimidex. Per uptodate, Arthralgia (15%; literature suggests incidence as high as 36%) ,back pain (10% to 12%).  ?Pending autoimmune, inflammatory markers as well.  I have provided her with a 4 day course of prednisone to see if this improves her symptoms.  Consider rheumatology referral at follow-up ? ?She has upcoming appointment Dr. Janese Banks 02/01/22 and I strongly encouraged her to speak with Dr. Janese Banks about changing potentially if Dr. Janese Banks thinks Arimidex could be exacerbating underlying symptoms.  We also discussed with the abrupt cessation of oxycodone, neuropathy likely contributing as well.  We will continue Lyrica 50 mg twice daily.   Referral is in place for pain management as well. ?

## 2022-01-22 LAB — ANA: Anti Nuclear Antibody (ANA): NEGATIVE

## 2022-01-22 LAB — RHEUMATOID FACTOR: Rheumatoid fact SerPl-aCnc: <14 IU/mL (ref <14)

## 2022-01-23 ENCOUNTER — Other Ambulatory Visit: Payer: Self-pay | Admitting: *Deleted

## 2022-01-23 LAB — CYCLIC CITRUL PEPTIDE ANTIBODY, IGG/IGA: Cyclic Citrullin Peptide Ab: 2 units (ref 0–19)

## 2022-01-24 ENCOUNTER — Other Ambulatory Visit: Payer: Self-pay

## 2022-01-24 ENCOUNTER — Encounter: Payer: Self-pay | Admitting: Oncology

## 2022-01-24 ENCOUNTER — Encounter: Payer: Self-pay | Admitting: Urology

## 2022-01-24 MED ORDER — OXYCODONE HCL 10 MG PO TABS
10.0000 mg | ORAL_TABLET | Freq: Two times a day (BID) | ORAL | 0 refills | Status: DC | PRN
  Filled 2022-01-24: qty 60, 30d supply, fill #0

## 2022-01-24 MED ORDER — GEMTESA 75 MG PO TABS
75.0000 mg | ORAL_TABLET | Freq: Every day | ORAL | 11 refills | Status: DC
Start: 2022-01-24 — End: 2022-05-23

## 2022-01-24 NOTE — Telephone Encounter (Signed)
Left message for pt to call back regarding lung screening referral. Will close this message and refer to referral notes.  ?

## 2022-01-24 NOTE — Telephone Encounter (Signed)
Spoke with patient regarding Oxybutynin, she will try the samples of Gemtesa and will send RX to [facility] to see if she qualifies for a lower tier exemption.  ?

## 2022-01-27 ENCOUNTER — Other Ambulatory Visit: Payer: Self-pay

## 2022-01-30 ENCOUNTER — Telehealth: Payer: Self-pay | Admitting: Acute Care

## 2022-01-30 ENCOUNTER — Ambulatory Visit
Admission: RE | Admit: 2022-01-30 | Discharge: 2022-01-30 | Disposition: A | Discharge status: Home / Self Care | Payer: No Typology Code available for payment source | Source: Ambulatory Visit | Attending: Oncology | Admitting: Oncology

## 2022-01-30 DIAGNOSIS — Z0189 Encounter for other specified special examinations: Secondary | ICD-10-CM | POA: Diagnosis not present

## 2022-01-30 DIAGNOSIS — Z170 Estrogen receptor positive status [ER+]: Secondary | ICD-10-CM | POA: Insufficient documentation

## 2022-01-30 DIAGNOSIS — C50412 Malignant neoplasm of upper-outer quadrant of left female breast: Secondary | ICD-10-CM | POA: Insufficient documentation

## 2022-01-30 LAB — ECHOCARDIOGRAM COMPLETE
AR max vel: 1.97 cm2
AV Area VTI: 2.16 cm2
AV Area mean vel: 1.91 cm2
AV Mean grad: 3 mmHg
AV Peak grad: 6.1 mmHg
Ao pk vel: 1.23 m/s
Area-P 1/2: 4.21 cm2
MV VTI: 1.58 cm2
S' Lateral: 2.6 cm

## 2022-01-30 NOTE — Progress Notes (Signed)
*  PRELIMINARY RESULTS* ?Echocardiogram ?2D Echocardiogram has been performed. ? ?Antwione Picotte, Sonia Side ?01/30/2022, 10:39 AM ?

## 2022-01-30 NOTE — Telephone Encounter (Signed)
FYI: I have spoken with pt regarding lung cancer screening. Pt was dx with Breast cancer in 08/2020. Lung screening guidelines are that a pt must be cancer free for 5 years before they are eligible for lung cancer screening. Pt is aware to f/u with PCP or Oncology for further follow up until she has reached the 5 yr mark.  ?

## 2022-01-31 ENCOUNTER — Encounter: Payer: Self-pay | Admitting: Physical Therapy

## 2022-01-31 ENCOUNTER — Ambulatory Visit (INDEPENDENT_AMBULATORY_CARE_PROVIDER_SITE_OTHER): Payer: No Typology Code available for payment source

## 2022-01-31 DIAGNOSIS — Z23 Encounter for immunization: Secondary | ICD-10-CM

## 2022-01-31 NOTE — Telephone Encounter (Signed)
noted 

## 2022-01-31 NOTE — Progress Notes (Signed)
Patient came in today for 2nd Heplisav vaccine given in right deltoid IM. Patient tolerated well with no signs of distress.  ?

## 2022-02-01 ENCOUNTER — Other Ambulatory Visit: Payer: Self-pay

## 2022-02-01 ENCOUNTER — Encounter: Payer: Self-pay | Admitting: Oncology

## 2022-02-01 ENCOUNTER — Inpatient Hospital Stay: Payer: No Typology Code available for payment source | Attending: Oncology

## 2022-02-01 ENCOUNTER — Inpatient Hospital Stay (HOSPITAL_BASED_OUTPATIENT_CLINIC_OR_DEPARTMENT_OTHER): Payer: No Typology Code available for payment source | Admitting: Oncology

## 2022-02-01 ENCOUNTER — Inpatient Hospital Stay: Payer: No Typology Code available for payment source

## 2022-02-01 VITALS — BP 109/73 | HR 68 | Temp 96.2°F | Resp 16 | Wt 114.8 lb

## 2022-02-01 DIAGNOSIS — Z79899 Other long term (current) drug therapy: Secondary | ICD-10-CM | POA: Insufficient documentation

## 2022-02-01 DIAGNOSIS — Z5111 Encounter for antineoplastic chemotherapy: Secondary | ICD-10-CM | POA: Diagnosis present

## 2022-02-01 DIAGNOSIS — E2839 Other primary ovarian failure: Secondary | ICD-10-CM | POA: Diagnosis not present

## 2022-02-01 DIAGNOSIS — Z5112 Encounter for antineoplastic immunotherapy: Secondary | ICD-10-CM | POA: Diagnosis present

## 2022-02-01 DIAGNOSIS — Z5181 Encounter for therapeutic drug level monitoring: Secondary | ICD-10-CM

## 2022-02-01 DIAGNOSIS — Z79811 Long term (current) use of aromatase inhibitors: Secondary | ICD-10-CM

## 2022-02-01 DIAGNOSIS — C50412 Malignant neoplasm of upper-outer quadrant of left female breast: Secondary | ICD-10-CM | POA: Diagnosis present

## 2022-02-01 DIAGNOSIS — Z170 Estrogen receptor positive status [ER+]: Secondary | ICD-10-CM

## 2022-02-01 DIAGNOSIS — Z7983 Long term (current) use of bisphosphonates: Secondary | ICD-10-CM

## 2022-02-01 LAB — COMPREHENSIVE METABOLIC PANEL
ALT: 37 U/L (ref 0–44)
AST: 37 U/L (ref 15–41)
Albumin: 3.7 g/dL (ref 3.5–5.0)
Alkaline Phosphatase: 96 U/L (ref 38–126)
Anion gap: 10 (ref 5–15)
BUN: 9 mg/dL (ref 6–20)
CO2: 23 mmol/L (ref 22–32)
Calcium: 8.8 mg/dL — ABNORMAL LOW (ref 8.9–10.3)
Chloride: 101 mmol/L (ref 98–111)
Creatinine, Ser: 0.79 mg/dL (ref 0.44–1.00)
GFR, Estimated: >60 mL/min (ref >60)
Glucose, Bld: 154 mg/dL — ABNORMAL HIGH (ref 70–99)
Potassium: 4 mmol/L (ref 3.5–5.1)
Sodium: 134 mmol/L — ABNORMAL LOW (ref 135–145)
Total Bilirubin: 0.5 mg/dL (ref 0.3–1.2)
Total Protein: 7.3 g/dL (ref 6.5–8.1)

## 2022-02-01 LAB — CBC WITH DIFFERENTIAL/PLATELET
Abs Immature Granulocytes: 0.02 10*3/uL (ref 0.00–0.07)
Basophils Absolute: 0 10*3/uL (ref 0.0–0.1)
Basophils Relative: 0 %
Eosinophils Absolute: 0.2 10*3/uL (ref 0.0–0.5)
Eosinophils Relative: 2 %
HCT: 38.3 % (ref 36.0–46.0)
Hemoglobin: 12.5 g/dL (ref 12.0–15.0)
Immature Granulocytes: 0 %
Lymphocytes Relative: 20 %
Lymphs Abs: 1.6 10*3/uL (ref 0.7–4.0)
MCH: 30.1 pg (ref 26.0–34.0)
MCHC: 32.6 g/dL (ref 30.0–36.0)
MCV: 92.3 fL (ref 80.0–100.0)
Monocytes Absolute: 0.8 10*3/uL (ref 0.1–1.0)
Monocytes Relative: 11 %
Neutro Abs: 5.3 10*3/uL (ref 1.7–7.7)
Neutrophils Relative %: 67 %
Platelets: 245 10*3/uL (ref 150–400)
RBC: 4.15 MIL/uL (ref 3.87–5.11)
RDW: 13.6 % (ref 11.5–15.5)
WBC: 7.9 10*3/uL (ref 4.0–10.5)
nRBC: 0 % (ref 0.0–0.2)

## 2022-02-01 MED ORDER — ACETAMINOPHEN 325 MG PO TABS
650.0000 mg | ORAL_TABLET | Freq: Once | ORAL | Status: AC
  Administered 2022-02-01: 650 mg via ORAL
  Filled 2022-02-01: qty 2

## 2022-02-01 MED ORDER — EXEMESTANE 25 MG PO TABS
25.0000 mg | ORAL_TABLET | Freq: Every day | ORAL | 3 refills | Status: DC
Start: 2022-02-01 — End: 2022-04-26
  Filled 2022-02-01: qty 30, 30d supply, fill #0
  Filled 2022-03-29: qty 30, 30d supply, fill #1

## 2022-02-01 MED ORDER — DIPHENHYDRAMINE HCL 25 MG PO CAPS
50.0000 mg | ORAL_CAPSULE | Freq: Once | ORAL | Status: AC
  Administered 2022-02-01: 50 mg via ORAL
  Filled 2022-02-01: qty 2

## 2022-02-01 MED ORDER — PERTUZ-TRASTUZ-HYALURON-ZZXF 60-60-2000 MG-MG-U/ML CHEMO ~~LOC~~ SOLN
10.0000 mL | Freq: Once | SUBCUTANEOUS | Status: AC
  Administered 2022-02-01: 10 mL via SUBCUTANEOUS
  Filled 2022-02-01: qty 10

## 2022-02-01 NOTE — Progress Notes (Signed)
? ? ? ?Hematology/Oncology Consult note ?[place]  ?[phone] [phone] ? ?Patient Care Team: ?Burnard Hawthorne, FNP as PCP - General (Family Medicine) ?Kate Sable, MD as PCP - Cardiology (Cardiology) ?Rico Junker, RN as Oncology Nurse Navigator ?Sindy Guadeloupe, MD as Consulting Physician (Hematology and Oncology)  ? ?Name of the patient: Tricia Berry  ?[id number]  ?November 25, 1970  ? ?Date of visit: 02/01/22 ? 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 ?Chief complaint/ Reason for visit-on treatment assessment prior to cycle 13 of adjuvant Herceptin and Perjeta ? 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  She then noticed that her lump is gradually getting larger with an area of ulceration on the skin and underwent a diagnostic bilateral mammogram on 09/08/2020 which showed hypoechoic interconnected masses in the left breast from 10:00 to 2 o'clock position spanning at least an area of 4.8 cm.  Ultrasound also showed 5 abnormal lymph nodes in the axilla with cortical thickening.  Both the mass and the lymph nodes were biopsied and was consistent with invasive mammary carcinoma grade 2.  Tumor was ER +91- 100%, PR +11 to 20% and HER-2 negative.  Ki-67 15%. Patient will be meeting [name] from [facility] surgery to discuss surgical management.  She is here for medical oncology recommendations.  In terms of her general health patient is otherwise doing well and does not have significant comorbidities.  She does endorse significant pain in the area of her left breast.  Tylenol and Motrin has not been helping her with this pain.  She lives with her husband and 2 children who  have special needs.  No prior history of abnormal breast mammograms or breast biopsies. ?  ?PET CT scan showed hypermetabolism in the area of the left breast but no evidence of hypermetabolism in the left axilla Or evidence of distant metastatic disease. ?  ?Neoadjuvant dose dense ACT chemotherapy started on 10/08/2020. Interim ultrasound after 4 cycles of dose dense AC chemotherapy showed overall decrease in volume of the tumor.  Nodularity previously seen on ultrasound was also decreased in size. ?  ?Patient completed neoadjuvant AC Taxol chemotherapy and underwent bilateral mastectomy with reconstruction.Final pathology showed fibrocystic changes in the right breast with no malignancy.  3.6 cm invasive mammary carcinoma in the left breast.  7 out of 16 lymph nodes positive for malignancy.  Treatment effect in the breast present but not robust.  Treatment effect in the lymph nodes minimal.  Margins negative.  Overall grade 2.  Extranodal extension present.  ER greater than 90% positive, PR 15% positive.  HER2 +2 equivocal and positive by FISH.  Patient started on adjuvant Herceptin and Perjeta since July 2022. ?  ? ?Interval history-patient is presently on oxycodone 10 mg twice a day as needed and is hoping to continue to decrease her daily requirements.  She reports chronic fatigue as well as generalized body aches.  Reports ongoing back pain as well as tingling numbness in her hands and feet.  For her neuropathy she is on Lyrica. ? ?ECOG PS- 1 ?Pain scale- 3 ?Opioid associated constipation- no ? ?Review of systems- Review of Systems  ?Constitutional:  Negative for  chills, fever, malaise/fatigue and weight loss.  ?HENT:  Negative for congestion, ear discharge and nosebleeds.   ?Eyes:  Negative for blurred vision.  ?Respiratory:  Negative for cough, hemoptysis, sputum production, shortness of breath and wheezing.   ?Cardiovascular:  Negative for chest pain, palpitations, orthopnea and claudication.   ?Gastrointestinal:  Negative for abdominal pain, blood in stool, constipation, diarrhea, heartburn, melena, nausea and vomiting.  ?Genitourinary:  Negative for dysuria, flank pain, frequency, hematuria and urgency.  ?Musculoskeletal:  Positive for joint pain. Negative for back pain and myalgias.  ?     Body aches  ?Skin:  Negative for rash.  ?Neurological:  Negative for dizziness, tingling, focal weakness, seizures, weakness and headaches.  ?Endo/Heme/Allergies:  Does not bruise/bleed easily.  ?Psychiatric/Behavioral:  Negative for depression and suicidal ideas. The patient does not have insomnia.    ? ? ?Allergies  ?Allergen Reactions  ? Morphine Nausea And Vomiting and Other (See Comments)  ?  migranes ?Other reaction(s): Headache ?Migraine, vomiting  ? Sertraline Hcl   ?  REACTION: Worsened symptoms of IBS  ? Sulfa Antibiotics Rash  ? Sulfamethoxazole Rash  ? Sulfonamide Derivatives Rash  ? ? ? ?Past Medical History:  ?Diagnosis Date  ? Anxiety   ? Breast cancer ([place])   ? Diverticulitis   ? Family history of ovarian cancer   ? GERD (gastroesophageal reflux disease)   ? IBS (irritable bowel syndrome)   ? PONV (postoperative nausea and vomiting)   ? severe migraine and vomiting post anesthesia  ? Scoliosis   ? ? ? ?Past Surgical History:  ?Procedure Laterality Date  ? ABDOMINAL HYSTERECTOMY    ? still has ovaries, no gyn cancer, hysterectomy due to endometriosis. NO cervix on exam 01/10/21  ? BREAST BIOPSY Left 09/15/2020  ? [place] bx of mass, path pending, Q marker  ? BREAST BIOPSY Left 09/15/2020  ? [place] bx of LN, hydromarker, path pending  ? BREAST RECONSTRUCTION WITH PLACEMENT OF TISSUE EXPANDER AND FLEX HD (ACELLULAR HYDRATED DERMIS) Bilateral 03/17/2021  ? Procedure: IMMEDIATE BILATERAL BREAST RECONSTRUCTION WITH PLACEMENT OF TISSUE EXPANDER AND FLEX HD (ACELLULAR HYDRATED DERMIS);  Surgeon: Wallace Going, DO;  Location: [place];  Service: Plastics;  Laterality: Bilateral;  ? COLONOSCOPY  WITH PROPOFOL N/A 11/21/2021  ? Procedure: COLONOSCOPY WITH PROPOFOL;  Surgeon: Lin Landsman, MD;  Location: [facility] ENDOSCOPY;  Service: Gastroenterology;  Laterality: N/A;  ? IR IMAGING GUIDED PORT INSERTION  10/01/2020  ? MODIFIED MASTECTOMY Left 03/17/2021  ? Procedure: LEFT MODIFIED RADICAL MASTECTOMY;  Surgeon: Erroll Luna, MD;  Location: [place];  Service: General;  Laterality: Left;  ? PORTA CATH REMOVAL Right 03/17/2021  ? Procedure: PORTA CATH REMOVAL;  Surgeon: Erroll Luna, MD;  Location: [place];  Service: General;  Laterality: Right;  ? REMOVAL OF BILATERAL TISSUE EXPANDERS WITH PLACEMENT OF BILATERAL BREAST IMPLANTS Bilateral 05/09/2021  ? Procedure: REMOVAL OF BILATERAL TISSUE EXPANDERS WITH PLACEMENT OF BILATERAL BREAST IMPLANTS;  Surgeon: Wallace Going, DO;  Location: [place];  Service: Plastics;  Laterality: Bilateral;  90 min  ? TOTAL MASTECTOMY Right 03/17/2021  ? Procedure: TOTAL MASTECTOMY;  Surgeon: Erroll Luna, MD;  Location: [place];  Service: General;  Laterality: Right;  ? ? ?Social History  ? ?Socioeconomic History  ? Marital status: Divorced  ?  Spouse name: Not on file  ? Number of children: 2  ? Years of education: Not on file  ? Highest education level: Not  on file  ?Occupational History  ? Occupation: Nurse  ?  Employer: [employer]  ?Tobacco Use  ? Smoking status: Every Day  ?  Packs/day: 0.25  ?  Types: Cigarettes  ? Smokeless tobacco: Never  ? Tobacco comments:  ?  Pt trying to quit now. Has reduced amount significantly  ?Vaping Use  ? Vaping Use: Never used  ?Substance and Sexual Activity  ? Alcohol use: Not Currently  ? Drug use: No  ? Sexual activity: Not Currently  ?  Birth control/protection: None  ?Other Topics Concern  ? Not on file  ?Social History Narrative  ? 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 She and her spouse are primary  caregivers.   ? ?Social Determinants of Health  ? ?Financial Resource Strain: Not on file  ?Food Insecurity: Not on file  ?Transportation Needs: Not on file  ?Physical Activity: Not on file  ?Stress: Not on file  ?Social Co

## 2022-02-01 NOTE — Patient Instructions (Signed)
[facility] CANCER CTR AT [place]  Discharge Instructions: ?Thank you for choosing [place] to provide your oncology and hematology care.  ?If you have a lab appointment with the [place], please go directly to the [place] and check in at the registration area. ? ?Wear comfortable clothing and clothing appropriate for easy access to any Portacath or PICC line.  ? ?We strive to give you quality time with your provider. You may need to reschedule your appointment if you arrive late (15 or more minutes).  Arriving late affects you and other patients whose appointments are after yours.  Also, if you miss three or more appointments without notifying the office, you may be dismissed from the clinic at the provider?s discretion.    ?  ?For prescription refill requests, have your pharmacy contact our office and allow 72 hours for refills to be completed.   ? ?Today you received the following chemotherapy and/or immunotherapy agents PHESGO    ?  ?To help prevent nausea and vomiting after your treatment, we encourage you to take your nausea medication as directed. ? ?BELOW ARE SYMPTOMS THAT SHOULD BE REPORTED IMMEDIATELY: ?*FEVER GREATER THAN 100.4 F (38 ?C) OR HIGHER ?*CHILLS OR SWEATING ?*NAUSEA AND VOMITING THAT IS NOT CONTROLLED WITH YOUR NAUSEA MEDICATION ?*UNUSUAL SHORTNESS OF BREATH ?*UNUSUAL BRUISING OR BLEEDING ?*URINARY PROBLEMS (pain or burning when urinating, or frequent urination) ?*BOWEL PROBLEMS (unusual diarrhea, constipation, pain near the anus) ?TENDERNESS IN MOUTH AND THROAT WITH OR WITHOUT PRESENCE OF ULCERS (sore throat, sores in mouth, or a toothache) ?UNUSUAL RASH, SWELLING OR PAIN  ?UNUSUAL VAGINAL DISCHARGE OR ITCHING  ? ?Items with * indicate a potential emergency and should be followed up as soon as possible or go to the Emergency Department if any problems should occur. ? ?Please show the CHEMOTHERAPY ALERT CARD or IMMUNOTHERAPY ALERT CARD at check-in to the  Emergency Department and triage nurse. ? ?Should you have questions after your visit or need to cancel or reschedule your appointment, please contact [facility] CANCER [place] AT [place]  [phone] and follow the prompts.  Office hours are 8:00 a.m. to 4:30 p.m. Monday - Friday. Please note that voicemails left after 4:00 p.m. may not be returned until the following business day.  We are closed weekends and major holidays. You have access to a nurse at all times for urgent questions. Please call the main number to the clinic [phone] and follow the prompts. ? ?For any non-urgent questions, you may also contact your provider using MyChart. We now offer e-Visits for anyone 42 and older to request care online for non-urgent symptoms. For details visit [email or web address]. ?  ?Also download the MyChart app! Go to the app store, search "MyChart", open the app, select [place], and log in with your MyChart username and password. ? ?Due to Covid, a mask is required upon entering the hospital/clinic. If you do not have a mask, one will be given to you upon arrival. For doctor visits, patients may have 1 support person aged 74 or older with them. For treatment visits, patients cannot have anyone with them due to current Covid guidelines and our immunocompromised population.  ? ?Pertuzumab; Trastuzumab; Hyaluronidase Injection ?What is this medication? ?PERTUZUMAB (per TOOZ ue mab); TRASTUZUMAB (tras TOO zoo mab); HYALURONIDASE (hye al ur ON i dase) is used to treat breast cancer. Pertuzumab and trastuzumab are a monoclonal antibodies. Hyaluronidase is used to improve the effects of pertuzumab and trastuzumab. ?This medicine may be used for  other purposes; ask your health care provider or pharmacist if you have questions. ?COMMON BRAND NAME(S): PHESGO ?What should I tell my care team before I take this medication? ?They need to know if you have any of these conditions: ?heart disease ?high blood  pressure ?history of irregular heartbeat ?lung or breathing disease, like asthma ?an unusual or allergic reaction to pertuzumab, trastuzumab, hyaluronidase, other medicines, foods, dyes, or preservatives ?pregnant or trying to get pregnant ?breast-feeding ?How should I use this medication? ?This medicine is for injection under the skin. It may be given by a health care professional in a hospital or clinic setting or a health care professional may give you this medicine at home. ?Talk to your pediatrician regarding the use of this medicine in children. Special care may be needed. ?Overdosage: If you think you have taken too much of this medicine contact a poison control center or emergency room at once. ?NOTE: This medicine is only for you. Do not share this medicine with others. ?What if I miss a dose? ?It is important not to miss your dose. Call your doctor or health care professional if you are unable to keep an appointment. ?What may interact with this medication? ?This medicine may interact with the following medications: ?certain types of chemotherapy, such as daunorubicin, doxorubicin, epirubicin, and idarubicin ?This list may not describe all possible interactions. Give your health care provider a list of all the medicines, herbs, non-prescription drugs, or dietary supplements you use. Also tell them if you smoke, drink alcohol, or use illegal drugs. Some items may interact with your medicine. ?What should I watch for while using this medication? ?Visit your health care professional for regular checks on your progress. Tell your health care professional if your symptoms do not start to get better or if they get worse. Your condition will be monitored carefully while you are receiving this medicine. ?Do not become pregnant while taking this medicine or for 7 months after stopping it. Women should inform their doctor if they wish to become pregnant or think they might be pregnant. There is a potential for  serious side effects to an unborn child. Talk to your health care professional or pharmacist for more information. ?What side effects may I notice from receiving this medication? ?Side effects that you should report to your doctor or health care professional as soon as possible: ?allergic reactions like skin rash, itching or hives, swelling of the face, lips, or tongue ?breathing problems ?chest pain ?cough ?dizziness ?feeling faint; lightheaded, falls ?fever or chills ?loss of consciousness ?pain, tingling, numbness in the hands or feet ?palpitations ?sudden weight gain ?swelling of the ankles or legs ?unusually weak or tired ?Side effects that usually do not require medical attention (report these to your doctor or health care professional if they continue or are bothersome): ?diarrhea ?hair loss ?joint pain ?muscle pain ?nausea, vomiting ?pain, redness, or irritation at site where injected ?tiredness ?This list may not describe all possible side effects. Call your doctor for medical advice about side effects. You may report side effects to FDA at 1-800-FDA-1088. ?Where should I keep my medication? ?Keep out of the reach of children. ?If you are using this medicine at home, you will be instructed on how to store it. Throw away any unused medicine after the expiration date on the label. ?NOTE: This sheet is a summary. It may not cover all possible information. If you have questions about this medicine, talk to your doctor, pharmacist, or health care provider. ??  2022 Elsevier/Gold Standard (2021-06-28 00:00:00) ? ?

## 2022-02-01 NOTE — Progress Notes (Signed)
Pt in for follow up, reports seeing urologists for "some issues".   ?

## 2022-02-02 ENCOUNTER — Ambulatory Visit
Admission: RE | Admit: 2022-02-02 | Discharge: 2022-02-02 | Disposition: A | Discharge status: Home / Self Care | Payer: No Typology Code available for payment source | Source: Ambulatory Visit | Attending: Radiation Oncology | Admitting: Radiation Oncology

## 2022-02-02 ENCOUNTER — Encounter: Payer: Self-pay | Admitting: Radiation Oncology

## 2022-02-02 VITALS — BP 80/74 | HR 73 | Temp 96.5°F | Resp 16 | Wt 113.9 lb

## 2022-02-02 DIAGNOSIS — Z170 Estrogen receptor positive status [ER+]: Secondary | ICD-10-CM | POA: Insufficient documentation

## 2022-02-02 DIAGNOSIS — M5136 Other intervertebral disc degeneration, lumbar region: Secondary | ICD-10-CM | POA: Diagnosis not present

## 2022-02-02 DIAGNOSIS — Z9013 Acquired absence of bilateral breasts and nipples: Secondary | ICD-10-CM | POA: Insufficient documentation

## 2022-02-02 DIAGNOSIS — Z79811 Long term (current) use of aromatase inhibitors: Secondary | ICD-10-CM | POA: Insufficient documentation

## 2022-02-02 DIAGNOSIS — Z923 Personal history of irradiation: Secondary | ICD-10-CM | POA: Diagnosis not present

## 2022-02-02 DIAGNOSIS — C50412 Malignant neoplasm of upper-outer quadrant of left female breast: Secondary | ICD-10-CM | POA: Insufficient documentation

## 2022-02-02 NOTE — Progress Notes (Signed)
Radiation Oncology ?Follow up Note ? ?Name: Tricia Berry   ?Date:   02/02/2022 ?MRN:  [id number] ?DOB: 07-07-1971  ? ? ?This 51 y.o. 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 ? ?REFERRING PROVIDER: ABurnard Hawthorne FNP ? 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  She is having no swelling in her upper extremities..  She is currently on Aromasin tolerating that well without side effect.  She had a MRI of her lumbar spine back in December showing degenerative disc disease as well as lumbar spondylosis no evidence of malignancy.  She also had a CT scan back in March which I have reviewed showing no significant noncardiac findings.  Noted with her bilateral breast implants with reconstruction no suspicious osseous lesions. ? ?COMPLICATIONS OF TREATMENT: none ? ?FOLLOW UP COMPLIANCE: keeps appointments  ? ?PHYSICAL EXAM:  ?BP (!) 80/74 (BP Location: Left Arm, Patient Position: Sitting)   Pulse 73   Temp (!) 96.5 ?F (35.8 ?C) (Tympanic)   Resp 16   Wt 113 lb 14.4 oz (51.7 kg)   BMI 22.24 kg/m?  ?Patient has bilateral breast reconstruction no evidence of mass or nodularity is noted in her bilateral breasts.  No axillary or supraclavicular adenopathy is identified.  No evidence of lymphedema is noted.  Well-developed well-nourished patient in NAD. HEENT reveals PERLA, EOMI, discs not visualized.  Oral cavity is clear. No oral mucosal lesions are identified. Neck is clear without evidence of cervical or supraclavicular  adenopathy. Lungs are clear to A&P. Cardiac examination is essentially unremarkable with regular rate and rhythm without murmur rub or thrill. Abdomen is benign with no organomegaly or masses noted. Motor sensory and DTR levels are equal and symmetric in the upper and lower extremities. Cranial nerves II through XII are grossly intact. Proprioception is intact. No peripheral adenopathy or edema is identified. No motor or sensory levels are noted. Crude visual fields are within normal range. ? ?RADIOLOGY RESULTS: CT scans reviewed as well as MRI of her lumbar spine compatible with above-stated findings ? ?PLAN: Present time patient is 6 months out with no evidence of disease.  And pleased with her overall progress.  I have asked to see her back in 6 months for follow-up and then we will start seeing her once a year.  She continues on Aromasin without side effect.  Patient is to call with any concerns. ? ?I would like to take this opportunity to thank you for allowing me to participate in the care of your patient.. ?  ? GNoreene Filbert MD ? ?

## 2022-02-03 ENCOUNTER — Other Ambulatory Visit: Payer: Self-pay

## 2022-02-08 ENCOUNTER — Inpatient Hospital Stay: Payer: No Typology Code available for payment source

## 2022-02-08 DIAGNOSIS — Z170 Estrogen receptor positive status [ER+]: Secondary | ICD-10-CM

## 2022-02-08 DIAGNOSIS — Z5111 Encounter for antineoplastic chemotherapy: Secondary | ICD-10-CM | POA: Diagnosis not present

## 2022-02-08 MED ORDER — GOSERELIN ACETATE 3.6 MG ~~LOC~~ IMPL
3.6000 mg | DRUG_IMPLANT | SUBCUTANEOUS | Status: DC
  Administered 2022-02-08: 3.6 mg via SUBCUTANEOUS
  Filled 2022-02-08: qty 3.6

## 2022-02-15 ENCOUNTER — Encounter: Payer: Self-pay | Admitting: Family

## 2022-02-20 ENCOUNTER — Other Ambulatory Visit: Payer: Self-pay | Admitting: Oncology

## 2022-02-21 ENCOUNTER — Other Ambulatory Visit: Payer: Self-pay

## 2022-02-21 ENCOUNTER — Encounter: Payer: Self-pay | Admitting: Oncology

## 2022-02-22 ENCOUNTER — Inpatient Hospital Stay: Payer: No Typology Code available for payment source | Attending: Oncology

## 2022-02-22 ENCOUNTER — Other Ambulatory Visit: Payer: Self-pay

## 2022-02-22 ENCOUNTER — Encounter: Payer: Self-pay | Admitting: Oncology

## 2022-02-22 ENCOUNTER — Inpatient Hospital Stay: Payer: No Typology Code available for payment source

## 2022-02-22 ENCOUNTER — Other Ambulatory Visit: Payer: Self-pay | Admitting: *Deleted

## 2022-02-22 VITALS — BP 133/82 | HR 63 | Temp 97.9°F | Resp 16 | Wt 114.4 lb

## 2022-02-22 DIAGNOSIS — C50411 Malignant neoplasm of upper-outer quadrant of right female breast: Secondary | ICD-10-CM | POA: Insufficient documentation

## 2022-02-22 DIAGNOSIS — Z5111 Encounter for antineoplastic chemotherapy: Secondary | ICD-10-CM | POA: Insufficient documentation

## 2022-02-22 DIAGNOSIS — C773 Secondary and unspecified malignant neoplasm of axilla and upper limb lymph nodes: Secondary | ICD-10-CM | POA: Insufficient documentation

## 2022-02-22 DIAGNOSIS — C50412 Malignant neoplasm of upper-outer quadrant of left female breast: Secondary | ICD-10-CM

## 2022-02-22 DIAGNOSIS — Z79899 Other long term (current) drug therapy: Secondary | ICD-10-CM | POA: Diagnosis not present

## 2022-02-22 LAB — BASIC METABOLIC PANEL
Anion gap: 9 (ref 5–15)
BUN: 7 mg/dL (ref 6–20)
CO2: 24 mmol/L (ref 22–32)
Calcium: 9.4 mg/dL (ref 8.9–10.3)
Chloride: 103 mmol/L (ref 98–111)
Creatinine, Ser: 0.85 mg/dL (ref 0.44–1.00)
GFR, Estimated: >60 mL/min (ref >60)
Glucose, Bld: 146 mg/dL — ABNORMAL HIGH (ref 70–99)
Potassium: 4.2 mmol/L (ref 3.5–5.1)
Sodium: 136 mmol/L (ref 135–145)

## 2022-02-22 MED ORDER — ACETAMINOPHEN 325 MG PO TABS
650.0000 mg | ORAL_TABLET | Freq: Once | ORAL | Status: AC
  Administered 2022-02-22: 650 mg via ORAL
  Filled 2022-02-22: qty 2

## 2022-02-22 MED ORDER — SODIUM CHLORIDE 0.9 % IV SOLN
Freq: Once | INTRAVENOUS | Status: AC
  Filled 2022-02-22: qty 250

## 2022-02-22 MED ORDER — PERTUZ-TRASTUZ-HYALURON-ZZXF 60-60-2000 MG-MG-U/ML CHEMO ~~LOC~~ SOLN
10.0000 mL | Freq: Once | SUBCUTANEOUS | Status: AC
  Administered 2022-02-22: 10 mL via SUBCUTANEOUS
  Filled 2022-02-22: qty 10

## 2022-02-22 MED ORDER — ZOLEDRONIC ACID 4 MG/100ML IV SOLN
4.0000 mg | INTRAVENOUS | Status: DC
  Administered 2022-02-22: 4 mg via INTRAVENOUS
  Filled 2022-02-22: qty 100

## 2022-02-22 MED ORDER — DIPHENHYDRAMINE HCL 25 MG PO CAPS
50.0000 mg | ORAL_CAPSULE | Freq: Once | ORAL | Status: AC
  Administered 2022-02-22: 50 mg via ORAL
  Filled 2022-02-22: qty 2

## 2022-02-22 MED ORDER — OXYCODONE HCL 10 MG PO TABS
10.0000 mg | ORAL_TABLET | ORAL | 0 refills | Status: DC
  Filled 2022-02-22: qty 67, 45d supply, fill #0

## 2022-02-22 NOTE — Progress Notes (Signed)
Patient monitored x 15 minutes post Phesgo injection. Patient tolerated treatment well.  ?

## 2022-02-22 NOTE — Patient Instructions (Signed)
[facility] CANCER CTR AT [place]  Discharge Instructions: ?Thank you for choosing [place] to provide your oncology and hematology care.  ?If you have a lab appointment with the [facility], please go directly to the [place] and check in at the registration area. ? ?Wear comfortable clothing and clothing appropriate for easy access to any Portacath or PICC line.  ? ?We strive to give you quality time with your provider. You may need to reschedule your appointment if you arrive late (15 or more minutes).  Arriving late affects you and other patients whose appointments are after yours.  Also, if you miss three or more appointments without notifying the office, you may be dismissed from the clinic at the provider?s discretion.    ?  ?For prescription refill requests, have your pharmacy contact our office and allow 72 hours for refills to be completed.   ? ?Today you received the following chemotherapy and/or immunotherapy agents Phesgo    ?  ?To help prevent nausea and vomiting after your treatment, we encourage you to take your nausea medication as directed. ? ?BELOW ARE SYMPTOMS THAT SHOULD BE REPORTED IMMEDIATELY: ?*FEVER GREATER THAN 100.4 F (38 ?C) OR HIGHER ?*CHILLS OR SWEATING ?*NAUSEA AND VOMITING THAT IS NOT CONTROLLED WITH YOUR NAUSEA MEDICATION ?*UNUSUAL SHORTNESS OF BREATH ?*UNUSUAL BRUISING OR BLEEDING ?*URINARY PROBLEMS (pain or burning when urinating, or frequent urination) ?*BOWEL PROBLEMS (unusual diarrhea, constipation, pain near the anus) ?TENDERNESS IN MOUTH AND THROAT WITH OR WITHOUT PRESENCE OF ULCERS (sore throat, sores in mouth, or a toothache) ?UNUSUAL RASH, SWELLING OR PAIN  ?UNUSUAL VAGINAL DISCHARGE OR ITCHING  ? ?Items with * indicate a potential emergency and should be followed up as soon as possible or go to the Emergency Department if any problems should occur. ? ?Please show the CHEMOTHERAPY ALERT CARD or IMMUNOTHERAPY ALERT CARD at check-in to the  Emergency Department and triage nurse. ? ?Should you have questions after your visit or need to cancel or reschedule your appointment, please contact [facility] CANCER [place] AT [place]  [phone] and follow the prompts.  Office hours are 8:00 a.m. to 4:30 p.m. Monday - Friday. Please note that voicemails left after 4:00 p.m. may not be returned until the following business day.  We are closed weekends and major holidays. You have access to a nurse at all times for urgent questions. Please call the main number to the clinic [phone] and follow the prompts. ? ?For any non-urgent questions, you may also contact your provider using MyChart. We now offer e-Visits for anyone 68 and older to request care online for non-urgent symptoms. For details visit [email or web address]. ?  ?Also download the MyChart app! Go to the app store, search "MyChart", open the app, select [place], and log in with your MyChart username and password. ? ?Due to Covid, a mask is required upon entering the hospital/clinic. If you do not have a mask, one will be given to you upon arrival. For doctor visits, patients may have 1 support person aged 30 or older with them. For treatment visits, patients cannot have anyone with them due to current Covid guidelines and our immunocompromised population.  ?

## 2022-02-27 ENCOUNTER — Other Ambulatory Visit: Payer: Self-pay | Admitting: Oncology

## 2022-02-27 ENCOUNTER — Other Ambulatory Visit: Payer: Self-pay

## 2022-02-27 ENCOUNTER — Telehealth: Payer: Self-pay

## 2022-02-27 NOTE — Telephone Encounter (Signed)
Faxed over disability papers to [employer] at [phone] and receieved a fax confirmation.  ?

## 2022-02-28 ENCOUNTER — Other Ambulatory Visit: Payer: Self-pay

## 2022-03-01 ENCOUNTER — Encounter: Payer: Self-pay | Admitting: Oncology

## 2022-03-01 ENCOUNTER — Encounter: Payer: Self-pay | Admitting: *Deleted

## 2022-03-01 ENCOUNTER — Other Ambulatory Visit: Payer: Self-pay

## 2022-03-01 MED ORDER — LORAZEPAM 0.5 MG PO TABS
0.5000 mg | ORAL_TABLET | Freq: Every evening | ORAL | 0 refills | Status: DC | PRN
  Filled 2022-03-01: qty 30, 30d supply, fill #0

## 2022-03-03 ENCOUNTER — Ambulatory Visit: Payer: No Typology Code available for payment source | Admitting: Family

## 2022-03-08 ENCOUNTER — Other Ambulatory Visit: Payer: Self-pay

## 2022-03-15 ENCOUNTER — Inpatient Hospital Stay: Payer: No Typology Code available for payment source

## 2022-03-15 ENCOUNTER — Inpatient Hospital Stay (HOSPITAL_BASED_OUTPATIENT_CLINIC_OR_DEPARTMENT_OTHER): Payer: No Typology Code available for payment source | Admitting: Oncology

## 2022-03-15 ENCOUNTER — Encounter: Payer: Self-pay | Admitting: Oncology

## 2022-03-15 VITALS — BP 107/68 | HR 59 | Temp 98.0°F | Resp 17

## 2022-03-15 VITALS — BP 130/80 | HR 68 | Temp 98.1°F | Resp 16 | Wt 112.8 lb

## 2022-03-15 DIAGNOSIS — Z5112 Encounter for antineoplastic immunotherapy: Secondary | ICD-10-CM

## 2022-03-15 DIAGNOSIS — C50412 Malignant neoplasm of upper-outer quadrant of left female breast: Secondary | ICD-10-CM | POA: Diagnosis not present

## 2022-03-15 DIAGNOSIS — G620 Drug-induced polyneuropathy: Secondary | ICD-10-CM | POA: Diagnosis not present

## 2022-03-15 DIAGNOSIS — Z170 Estrogen receptor positive status [ER+]: Secondary | ICD-10-CM

## 2022-03-15 DIAGNOSIS — Z5111 Encounter for antineoplastic chemotherapy: Secondary | ICD-10-CM | POA: Diagnosis not present

## 2022-03-15 DIAGNOSIS — T451X5A Adverse effect of antineoplastic and immunosuppressive drugs, initial encounter: Secondary | ICD-10-CM

## 2022-03-15 LAB — COMPREHENSIVE METABOLIC PANEL
ALT: 19 U/L (ref 0–44)
AST: 23 U/L (ref 15–41)
Albumin: 4 g/dL (ref 3.5–5.0)
Alkaline Phosphatase: 82 U/L (ref 38–126)
Anion gap: 8 (ref 5–15)
BUN: 8 mg/dL (ref 6–20)
CO2: 26 mmol/L (ref 22–32)
Calcium: 9.5 mg/dL (ref 8.9–10.3)
Chloride: 103 mmol/L (ref 98–111)
Creatinine, Ser: 1.08 mg/dL — ABNORMAL HIGH (ref 0.44–1.00)
GFR, Estimated: >60 mL/min (ref >60)
Glucose, Bld: 127 mg/dL — ABNORMAL HIGH (ref 70–99)
Potassium: 4.2 mmol/L (ref 3.5–5.1)
Sodium: 137 mmol/L (ref 135–145)
Total Bilirubin: 0.2 mg/dL — ABNORMAL LOW (ref 0.3–1.2)
Total Protein: 7.8 g/dL (ref 6.5–8.1)

## 2022-03-15 LAB — CBC WITH DIFFERENTIAL/PLATELET
Abs Immature Granulocytes: 0.02 10*3/uL (ref 0.00–0.07)
Basophils Absolute: 0.1 10*3/uL (ref 0.0–0.1)
Basophils Relative: 1 %
Eosinophils Absolute: 0 10*3/uL (ref 0.0–0.5)
Eosinophils Relative: 1 %
HCT: 42.3 % (ref 36.0–46.0)
Hemoglobin: 13.7 g/dL (ref 12.0–15.0)
Immature Granulocytes: 0 %
Lymphocytes Relative: 39 %
Lymphs Abs: 3.1 10*3/uL (ref 0.7–4.0)
MCH: 29.7 pg (ref 26.0–34.0)
MCHC: 32.4 g/dL (ref 30.0–36.0)
MCV: 91.6 fL (ref 80.0–100.0)
Monocytes Absolute: 0.4 10*3/uL (ref 0.1–1.0)
Monocytes Relative: 5 %
Neutro Abs: 4.3 10*3/uL (ref 1.7–7.7)
Neutrophils Relative %: 54 %
Platelets: 301 10*3/uL (ref 150–400)
RBC: 4.62 MIL/uL (ref 3.87–5.11)
RDW: 14.1 % (ref 11.5–15.5)
WBC: 8 10*3/uL (ref 4.0–10.5)
nRBC: 0 % (ref 0.0–0.2)

## 2022-03-15 MED ORDER — DIPHENHYDRAMINE HCL 25 MG PO CAPS
50.0000 mg | ORAL_CAPSULE | Freq: Once | ORAL | Status: AC
  Administered 2022-03-15: 50 mg via ORAL
  Filled 2022-03-15: qty 2

## 2022-03-15 MED ORDER — PERTUZ-TRASTUZ-HYALURON-ZZXF 60-60-2000 MG-MG-U/ML CHEMO ~~LOC~~ SOLN
10.0000 mL | Freq: Once | SUBCUTANEOUS | Status: AC
  Administered 2022-03-15: 10 mL via SUBCUTANEOUS
  Filled 2022-03-15: qty 10

## 2022-03-15 MED ORDER — GOSERELIN ACETATE 3.6 MG ~~LOC~~ IMPL
3.6000 mg | DRUG_IMPLANT | SUBCUTANEOUS | Status: DC
  Administered 2022-03-15: 3.6 mg via SUBCUTANEOUS
  Filled 2022-03-15: qty 3.6

## 2022-03-15 MED ORDER — ACETAMINOPHEN 325 MG PO TABS
650.0000 mg | ORAL_TABLET | Freq: Once | ORAL | Status: AC
  Administered 2022-03-15: 650 mg via ORAL
  Filled 2022-03-15: qty 2

## 2022-03-15 NOTE — Patient Instructions (Signed)
[facility] CANCER CTR AT [place]  Discharge Instructions: Thank you for choosing [facility] to provide your oncology and hematology care.  If you have a lab appointment with the [facility], please go directly to the [facility] and check in at the registration area.  Wear comfortable clothing and clothing appropriate for easy access to any Portacath or PICC line.   We strive to give you quality time with your provider. You may need to reschedule your appointment if you arrive late (15 or more minutes).  Arriving late affects you and other patients whose appointments are after yours.  Also, if you miss three or more appointments without notifying the office, you may be dismissed from the clinic at the provider's discretion.      For prescription refill requests, have your pharmacy contact our office and allow 72 hours for refills to be completed.    Today you received the following chemotherapy and/or immunotherapy agents Phesgo       To help prevent nausea and vomiting after your treatment, we encourage you to take your nausea medication as directed.  BELOW ARE SYMPTOMS THAT SHOULD BE REPORTED IMMEDIATELY: *FEVER GREATER THAN 100.4 F (38 C) OR HIGHER *CHILLS OR SWEATING *NAUSEA AND VOMITING THAT IS NOT CONTROLLED WITH YOUR NAUSEA MEDICATION *UNUSUAL SHORTNESS OF BREATH *UNUSUAL BRUISING OR BLEEDING *URINARY PROBLEMS (pain or burning when urinating, or frequent urination) *BOWEL PROBLEMS (unusual diarrhea, constipation, pain near the anus) TENDERNESS IN MOUTH AND THROAT WITH OR WITHOUT PRESENCE OF ULCERS (sore throat, sores in mouth, or a toothache) UNUSUAL RASH, SWELLING OR PAIN  UNUSUAL VAGINAL DISCHARGE OR ITCHING   Items with * indicate a potential emergency and should be followed up as soon as possible or go to the Emergency Department if any problems should occur.  Please show the CHEMOTHERAPY ALERT CARD or IMMUNOTHERAPY ALERT CARD at check-in to the  Emergency Department and triage nurse.  Should you have questions after your visit or need to cancel or reschedule your appointment, please contact [facility] CANCER [place] AT [place]  [phone] and follow the prompts.  Office hours are 8:00 a.m. to 4:30 p.m. Monday - Friday. Please note that voicemails left after 4:00 p.m. may not be returned until the following business day.  We are closed weekends and major holidays. You have access to a nurse at all times for urgent questions. Please call the main number to the clinic [phone] and follow the prompts.  For any non-urgent questions, you may also contact your provider using MyChart. We now offer e-Visits for anyone 43 and older to request care online for non-urgent symptoms. For details visit [email or web address].   Also download the MyChart app! Go to the app store, search "MyChart", open the app, select [place], and log in with your MyChart username and password.  Due to Covid, a mask is required upon entering the hospital/clinic. If you do not have a mask, one will be given to you upon arrival. For doctor visits, patients may have 1 support person aged 60 or older with them. For treatment visits, patients cannot have anyone with them due to current Covid guidelines and our immunocompromised population.     Goserelin injection What is this medication? GOSERELIN (GOE se rel in) is similar to a hormone found in the body. It lowers the amount of sex hormones that the body makes. Men will have lower testosterone levels and women will have lower estrogen levels while taking this medicine. In men, this medicine  is used to treat prostate cancer; the injection is either given once per month or once every 12 weeks. A once per month injection (only) is used to treat women with endometriosis, dysfunctional uterine bleeding, or advanced breast cancer. This medicine may be used for other purposes; ask your health care provider or  pharmacist if you have questions. COMMON BRAND NAME(S): Zoladex, Zoladex 39-Month What should I tell my care team before I take this medication? They need to know if you have any of these conditions: bone problems diabetes heart disease history of irregular heartbeat an unusual or allergic reaction to goserelin, other medicines, foods, dyes, or preservatives pregnant or trying to get pregnant breast-feeding How should I use this medication? This medicine is for injection under the skin. It is given by a health care professional in a hospital or clinic setting. Talk to your pediatrician regarding the use of this medicine in children. Special care may be needed. Overdosage: If you think you have taken too much of this medicine contact a poison control center or emergency room at once. NOTE: This medicine is only for you. Do not share this medicine with others. What if I miss a dose? It is important not to miss your dose. Call your doctor or health care professional if you are unable to keep an appointment. What may interact with this medication? Do not take this medicine with any of the following medications: cisapride dronedarone pimozide thioridazine This medicine may also interact with the following medications: other medicines that prolong the QT interval (an abnormal heart rhythm) This list may not describe all possible interactions. Give your health care provider a list of all the medicines, herbs, non-prescription drugs, or dietary supplements you use. Also tell them if you smoke, drink alcohol, or use illegal drugs. Some items may interact with your medicine. What should I watch for while using this medication? Visit your doctor or health care provider for regular checks on your progress. Your symptoms may appear to get worse during the first weeks of this therapy. Tell your doctor or healthcare provider if your symptoms do not start to get better or if they get worse after this  time. Your bones may get weaker if you take this medicine for a long time. If you smoke or frequently drink alcohol you may increase your risk of bone loss. A family history of osteoporosis, chronic use of drugs for seizures (convulsions), or corticosteroids can also increase your risk of bone loss. Talk to your doctor about how to keep your bones strong. This medicine should stop regular monthly menstruation in women. Tell your doctor if you continue to menstruate. Women should not become pregnant while taking this medicine or for 12 weeks after stopping this medicine. Women should inform their doctor if they wish to become pregnant or think they might be pregnant. There is a potential for serious side effects to an unborn child. Talk to your health care professional or pharmacist for more information. Do not breast-feed an infant while taking this medicine. Men should inform their doctors if they wish to father a child. This medicine may lower sperm counts. Talk to your health care professional or pharmacist for more information. This medicine may increase blood sugar. Ask your healthcare provider if changes in diet or medicines are needed if you have diabetes. What side effects may I notice from receiving this medication? Side effects that you should report to your doctor or health care professional as soon as possible: allergic reactions like  skin rash, itching or hives, swelling of the face, lips, or tongue bone pain breathing problems changes in vision chest pain feeling faint or lightheaded, falls fever, chills pain, swelling, warmth in the leg pain, tingling, numbness in the hands or feet signs and symptoms of high blood sugar such as being more thirsty or hungry or having to urinate more than normal. You may also feel very tired or have blurry vision signs and symptoms of low blood pressure like dizziness; feeling faint or lightheaded, falls; unusually weak or tired stomach pain swelling  of the ankles, feet, hands trouble passing urine or change in the amount of urine unusually high or low blood pressure unusually weak or tired Side effects that usually do not require medical attention (report to your doctor or health care professional if they continue or are bothersome): change in sex drive or performance changes in breast size in both males and females changes in emotions or moods headache hot flashes irritation at site where injected loss of appetite skin problems like acne, dry skin vaginal dryness This list may not describe all possible side effects. Call your doctor for medical advice about side effects. You may report side effects to FDA at 1-800-FDA-1088. Where should I keep my medication? This drug is given in a hospital or clinic and will not be stored at home. NOTE: This sheet is a summary. It may not cover all possible information. If you have questions about this medicine, talk to your doctor, pharmacist, or health care provider.  2023 Elsevier/Gold Standard (2019-02-07 00:00:00)

## 2022-03-15 NOTE — Progress Notes (Signed)
Hematology/Oncology Consult note [facility]  [phone] [phone]  Patient Care Team: Burnard Hawthorne, FNP as PCP - General (Family Medicine) Kate Sable, MD as PCP - Cardiology (Cardiology) Rico Junker, RN as Oncology Nurse Navigator Sindy Guadeloupe, MD as Consulting Physician (Hematology and Oncology)   Name of the patient: Tricia Berry  [id number]  1971-09-12   Date of visit: 03/15/22  Diagnosis-  invasive mammary carcinoma of the left breast stage III ypT2 ypN2 cM0 ER/PR positive and HER2 positive by [facility]  Chief complaint/ Reason for visit-on treatment assessment prior to cycle 15 of adjuvant Herceptin and Perjeta  Heme/Onc history: 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  She then noticed that her lump is gradually getting larger with an area of ulceration on the skin and underwent a diagnostic bilateral mammogram on 09/08/2020 which showed hypoechoic interconnected masses in the left breast from 10:00 to 2 o'clock position spanning at least an area of 4.8 cm.  Ultrasound also showed 5 abnormal lymph nodes in the axilla with cortical thickening.  Both the mass and the lymph nodes were biopsied and was consistent with invasive mammary carcinoma grade 2.  Tumor was ER +91- 100%, PR +11 to 20% and HER-2 negative.  Ki-67 15%. Patient will be meeting [name] from [facility] surgery to discuss surgical management.  She is here for medical oncology recommendations.  In terms of her general health patient is otherwise doing well and does not have significant comorbidities.  She does endorse significant pain in the area of her left breast.  Tylenol and Motrin has not been helping her with this pain.  She lives with her husband and 2 children who  have special needs.  No prior history of abnormal breast mammograms or breast biopsies.   PET CT scan showed hypermetabolism in the area of the left breast but no evidence of hypermetabolism in the left axilla Or evidence of distant metastatic disease.   Neoadjuvant dose dense ACT chemotherapy started on 10/08/2020. Interim ultrasound after 4 cycles of dose dense AC chemotherapy showed overall decrease in volume of the tumor.  Nodularity previously seen on ultrasound was also decreased in size.   Patient completed neoadjuvant AC Taxol chemotherapy and underwent bilateral mastectomy with reconstruction.Final pathology showed fibrocystic changes in the right breast with no malignancy.  3.6 cm invasive mammary carcinoma in the left breast.  7 out of 16 lymph nodes positive for malignancy.  Treatment effect in the breast present but not robust.  Treatment effect in the lymph nodes minimal.  Margins negative.  Overall grade 2.  Extranodal extension present.  ER greater than 90% positive, PR 15% positive.  HER2 +2 equivocal and positive by FISH.  Patient started on adjuvant Herceptin and Perjeta since July 2022.    Interval history-patient has not yet heard back from her pain clinic appointment.  Neuropathy in her hands and feet is overall stable.  ECOG PS- 1 Pain scale- 3 Opioid associated constipation- no  Review of systems- Review of Systems  Constitutional:  Positive for malaise/fatigue.  Neurological:  Positive for sensory change (Peripheral neuropathy).      Allergies  Allergen Reactions   Morphine Nausea And Vomiting and Other (See Comments)    migranes Other reaction(s): Headache  Migraine, vomiting   Sertraline Hcl     REACTION: Worsened symptoms of IBS   Sulfa Antibiotics Rash   Sulfamethoxazole Rash   Sulfonamide Derivatives Rash     Past Medical History:  Diagnosis Date   Anxiety    Breast cancer ([place])    Diverticulitis    Family history of ovarian cancer    GERD  (gastroesophageal reflux disease)    IBS (irritable bowel syndrome)    PONV (postoperative nausea and vomiting)    severe migraine and vomiting post anesthesia   Scoliosis      Past Surgical History:  Procedure Laterality Date   ABDOMINAL HYSTERECTOMY     still has ovaries, no gyn cancer, hysterectomy due to endometriosis. NO cervix on exam 01/10/21   BREAST BIOPSY Left 09/15/2020   [place] bx of mass, path pending, Q marker   BREAST BIOPSY Left 09/15/2020   [place] bx of LN, hydromarker, path pending   BREAST RECONSTRUCTION WITH PLACEMENT OF TISSUE EXPANDER AND FLEX HD (ACELLULAR HYDRATED DERMIS) Bilateral 03/17/2021   Procedure: IMMEDIATE BILATERAL BREAST RECONSTRUCTION WITH PLACEMENT OF TISSUE EXPANDER AND FLEX HD (ACELLULAR HYDRATED DERMIS);  Surgeon: Wallace Going, DO;  Location: [place];  Service: Plastics;  Laterality: Bilateral;   COLONOSCOPY WITH PROPOFOL N/A 11/21/2021   Procedure: COLONOSCOPY WITH PROPOFOL;  Surgeon: Lin Landsman, MD;  Location: [facility] ENDOSCOPY;  Service: Gastroenterology;  Laterality: N/A;   IR IMAGING GUIDED PORT INSERTION  10/01/2020   MODIFIED MASTECTOMY Left 03/17/2021   Procedure: LEFT MODIFIED RADICAL MASTECTOMY;  Surgeon: Erroll Luna, MD;  Location: [place];  Service: General;  Laterality: Left;   PORTA CATH REMOVAL Right 03/17/2021   Procedure: PORTA CATH REMOVAL;  Surgeon: Erroll Luna, MD;  Location: [place];  Service: General;  Laterality: Right;   REMOVAL OF BILATERAL TISSUE EXPANDERS WITH PLACEMENT OF BILATERAL BREAST IMPLANTS Bilateral 05/09/2021   Procedure: REMOVAL OF BILATERAL TISSUE EXPANDERS WITH PLACEMENT OF BILATERAL BREAST IMPLANTS;  Surgeon: Wallace Going, DO;  Location: [place];  Service: Plastics;  Laterality: Bilateral;  90 min   TOTAL MASTECTOMY Right 03/17/2021   Procedure: TOTAL MASTECTOMY;  Surgeon: Erroll Luna, MD;  Location: [place];  Service: General;  Laterality: Right;    Social History   Socioeconomic History   Marital status: Divorced    Spouse name: Not on file   Number of children: 2   Years of education: Not on file   Highest education level: Not on file  Occupational History   Occupation: Nurse    Employer: [employer]  Tobacco Use   Smoking status: Every Day    Packs/day: 0.25    Types: Cigarettes   Smokeless tobacco: Never   Tobacco comments:    Pt trying to quit now. Has reduced amount significantly  Vaping Use   Vaping Use: Never used  Substance and Sexual Activity   Alcohol use: Not Currently   Drug use: No   Sexual activity: Not Currently    Birth control/protection: None  Other Topics Concern   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 She and her spouse are primary caregivers.    Social Determinants of Health   Financial Resource Strain: Not on file  Food Insecurity: Not on file  Transportation Needs: Not on file  Physical Activity: Not on file  Stress:  Not on file  Social Connections: Not on file  Intimate Partner Violence: Not on file    Family History  Problem Relation Age of Onset   Hypertension Mother    Osteoarthritis Mother    Diverticulitis Mother    Heart failure Father    Hypertension Father    Gout Father    Heart attack Father 81   Diverticulitis Brother    Ovarian cancer Paternal Grandmother      Current Outpatient Medications:    cyanocobalamin (,VITAMIN B-12,) 1000 MCG/ML injection, 1000 mcg (1 mL) intramuscular injection in the thigh ( vastus lateralis) once per month., Disp: 3 mL, Rfl: 4   exemestane (AROMASIN) 25 MG tablet, Take 1 tablet (25 mg total) by mouth daily after breakfast., Disp: 30 tablet, Rfl: 3   FLUoxetine (PROZAC) 40 MG capsule, Take 1 capsule (40 mg total) by mouth every morning., Disp: 90 capsule, Rfl: 1   Lactobacillus (PROBIOTIC  ACIDOPHILUS PO), Take 1 capsule by mouth daily., Disp: , Rfl:    LORazepam (ATIVAN) 0.5 MG tablet, Take 1 tablet (0.5 mg total) by mouth at bedtime as needed for up to 30 doses for anxiety., Disp: 30 tablet, Rfl: 0   Multiple Vitamins-Minerals (MULTIVITAL PO), Take 1 Dose by mouth daily., Disp: , Rfl:    nicotine (NICODERM CQ - DOSED IN MG/24 HOURS) 21 mg/24hr patch, use 1 patch daily, Disp: 28 patch, Rfl: 0   nicotine polacrilex (SM NICOTINE) 2 MG lozenge, Take by mouth., Disp: 72 lozenge, Rfl: 0   ondansetron (ZOFRAN) 8 MG tablet, Take 1 tablet (8 mg total) by mouth every 8 (eight) hours as needed for nausea or vomiting., Disp: 60 tablet, Rfl: 0   Oxycodone HCl 10 MG TABS, Take 1 tablet (10 mg total) by mouth as directed. Alternating with day 1 pill one day and the next day can take 2 tablet through the day. This will cover the patient for 45 days, Disp: 67 tablet, Rfl: 0   pregabalin (LYRICA) 150 MG capsule, Take 1 capsule (150 mg total) by mouth in the morning, at noon, and at bedtime., Disp: 90 capsule, Rfl: 1   rosuvastatin (CRESTOR) 5 MG tablet, Take 1 tablet (5 mg total) by mouth every evening., Disp: 90 tablet, Rfl: 3   trimethoprim (TRIMPEX) 100 MG tablet, Take 1 tablet (100 mg total) by mouth daily., Disp: 30 tablet, Rfl: 5   lidocaine-prilocaine (EMLA) cream, Apply 1 application topically as needed., Disp: 30 g, Rfl: 0   predniSONE (DELTASONE) 10 MG tablet, Take 40 mg by mouth on day 1, then taper 10 mg daily until gone (Patient not taking: Reported on 02/01/2022), Disp: 10 tablet, Rfl: 0   tiZANidine (ZANAFLEX) 4 MG tablet, Take 1 tablet (4 mg total) by mouth 3 (three) times daily. (Patient not taking: Reported on 03/15/2022), Disp: 30 tablet, Rfl: 0   traZODone (DESYREL) 50 MG tablet, Take 0.5-1 tablets (25-50 mg total) by mouth at bedtime as needed for sleep. (Patient not taking: Reported on 02/01/2022), Disp: 30 tablet, Rfl: 3   Vibegron (GEMTESA) 75 MG TABS, Take 75 mg by mouth daily.  (Patient not taking: Reported on 02/01/2022), Disp: 30 tablet, Rfl: 11 No current facility-administered medications for this visit.  Facility-Administered Medications Ordered in Other Visits:    goserelin (ZOLADEX) injection 3.6 mg, 3.6 mg, Subcutaneous, Q28 days, Sindy Guadeloupe, MD, 3.6 mg at 05/04/21 1150   goserelin (ZOLADEX) injection 3.6 mg, 3.6 mg, Subcutaneous, Q28 days, Sindy Guadeloupe, MD, 3.6 mg at  03/15/22 1546   heparin lock flush 100 unit/mL, 500 Units, Intravenous, Once, Sindy Guadeloupe, MD  Physical exam:  Vitals:   03/15/22 1345  BP: 130/80  Pulse: 68  Resp: 16  Temp: 98.1 F (36.7 C)  SpO2: 98%  Weight: 112 lb 12.8 oz (51.2 kg)   Physical Exam Constitutional:      General: She is not in acute distress. Cardiovascular:     Rate and Rhythm: Normal rate and regular rhythm.     Heart sounds: Normal heart sounds.  Pulmonary:     Effort: Pulmonary effort is normal.     Breath sounds: Normal breath sounds.  Skin:    General: Skin is warm and dry.  Neurological:     Mental Status: She is alert and oriented to person, place, and time.  Chest wall exam: Patient is s/p bilateral mastectomy without reconstruction.  No evidence of chest wall recurrence     Latest Ref Rng & Units 03/15/2022    1:17 PM  CMP  Glucose 70 - 99 mg/dL 127    BUN 6 - 20 mg/dL 8    Creatinine 0.44 - 1.00 mg/dL 1.08    Sodium 135 - 145 mmol/L 137    Potassium 3.5 - 5.1 mmol/L 4.2    Chloride 98 - 111 mmol/L 103    CO2 22 - 32 mmol/L 26    Calcium 8.9 - 10.3 mg/dL 9.5    Total Protein 6.5 - 8.1 g/dL 7.8    Total Bilirubin 0.3 - 1.2 mg/dL 0.2    Alkaline Phos 38 - 126 U/L 82    AST 15 - 41 U/L 23    ALT 0 - 44 U/L 19        Latest Ref Rng & Units 03/15/2022    1:17 PM  CBC  WBC 4.0 - 10.5 K/uL 8.0    Hemoglobin 12.0 - 15.0 g/dL 13.7    Hematocrit 36.0 - 46.0 % 42.3    Platelets 150 - 400 K/uL 301       Assessment and plan- Patient is a 51 y.o. female with known history of locally  advanced left breast cancer which was ER/PR positive and HER2 negative on initial biopsy s/p neoadjuvant AC Taxol chemotherapy final pathology showed 3.6 cm residual breast mass with 7 positive lymph nodes with extracapsular extension.  ER/PR positive and HER2 positive.  She is here for on treatment assessment prior to cycle 15 of adjuvant Herceptin and Perjeta  Counts okay to proceed with cycle 15 of adjuvant Herceptin and Perjeta today.  She will directly proceed for cycle 16 in 3 weeks and I will see her back in 6 weeks for cycle 17 which would be her last cycle.  Her recent echocardiogram was normal.  Patient will receive Zoladex for ovarian suppression today now for about 3 months we will hold off on giving her Zoladex and see if she naturally attains menopause.  She is currently on Aromasin for endocrine therapyWhich she will continue.  Patient is also getting Zometa every 3 months for high risk breast cancer and will receive her next dose in August 2023.  Chemo-induced peripheral neuropathy: Continue Lyrica.  She is also on as needed oxycodone.  She is yet to hear back from pain clinic.  We mutually agreed upon not continuing any oxycodone beyond the month of August.  She will be getting a prescription next week for 60 tablets for a month followed by 30 tablets for a month followed  by 15 tablets for a month and then stop  Patient will also no longer be receiving any Ativan refills.  She will be talking to her primary care provider about considering trazodone for insomnia.   Visit Diagnosis 1. Encounter for monoclonal antibody treatment for malignancy   2. Malignant neoplasm of upper-outer quadrant of left breast in female, estrogen receptor positive ([place])      Dr. Randa Evens, MD, MPH [facility] at [facility] [phone] 03/15/2022 4:01 PM

## 2022-03-15 NOTE — Progress Notes (Signed)
Pt will like to discuss oxycodone schedule.

## 2022-03-20 ENCOUNTER — Other Ambulatory Visit: Payer: Self-pay | Admitting: Oncology

## 2022-03-21 ENCOUNTER — Encounter: Payer: Self-pay | Admitting: Oncology

## 2022-03-21 ENCOUNTER — Other Ambulatory Visit: Payer: Self-pay

## 2022-03-21 MED ORDER — OXYCODONE HCL 10 MG PO TABS
10.0000 mg | ORAL_TABLET | ORAL | 0 refills | Status: DC
  Filled 2022-03-21 (×2): qty 60, 41d supply, fill #0

## 2022-03-21 MED ORDER — ONDANSETRON HCL 8 MG PO TABS
8.0000 mg | ORAL_TABLET | Freq: Three times a day (TID) | ORAL | 0 refills | Status: DC | PRN
  Filled 2022-03-21: qty 60, 20d supply, fill #0

## 2022-03-28 ENCOUNTER — Ambulatory Visit (INDEPENDENT_AMBULATORY_CARE_PROVIDER_SITE_OTHER): Payer: No Typology Code available for payment source | Admitting: Student

## 2022-03-28 DIAGNOSIS — Z9013 Acquired absence of bilateral breasts and nipples: Secondary | ICD-10-CM

## 2022-03-28 DIAGNOSIS — N651 Disproportion of reconstructed breast: Secondary | ICD-10-CM | POA: Diagnosis not present

## 2022-03-28 NOTE — Progress Notes (Signed)
   Referring Provider Burnard Hawthorne, FNP [address] [place],  [place] [address]   CC:  Chief Complaint  Patient presents with   Follow-up      Tricia Berry is an 51 y.o. female.  HPI:  Patient is a 51 year old female with past medical history including left breast cancer.  Patient underwent bilateral mastectomies with [name] and immediate bilateral breast reconstruction with placement of acellular dermal matrix and tissue expanders with Dr. Marla Roe in May 2022.  Patient then underwent bilateral exchange of tissue expanders for implants in July 2022.  The implants that were placed were Mentor smooth round high-profile saline 320 cc bilaterally.  Patient was last seen in the clinic on 08/16/2021.  Patient was interested in fat grafting for better symmetry of her breasts.  Patient did undergo radiation therapy and the skin was fairly damaged from this.  Patient was recommended to follow-up in 6 months to avoid radiation complications.  Today, patient reports she is doing well.  Patient states that she still feels her breasts are asymmetric and that she would still like to pursue potential fat grafting to improve symmetry.  Patient is also inquiring if the right breast could be lifted as well.  Patient denies any major changes in her health since she was last seen in the clinic.  She reports she finished radiation in early October 2022.    Review of Systems General: Denies fevers, chills, or any changes in interim health Breasts: Reports breast asymmetry   Physical Exam    03/15/2022    3:56 PM 03/15/2022    1:45 PM 02/22/2022   10:51 AM  Vitals with BMI  Weight  112 lbs 13 oz   Systolic 585 277 824  Diastolic 68 80 82  Pulse 59 68 63    Chaperone present on exam.  General:  No acute distress,  Alert and oriented, Non-Toxic, Normal speech and affect Chest: Right breast has settled more inferiorly than the left breast.  There is decreased volume in the  medial aspect of the right breast.  There is no erythema, drainage or significant swelling in either breast. Psych: Normal behavior and mood Respiratory: No increased WOB MSK: Ambulatory  Assessment/Plan  Discussed with patient that she can move forward with fat grafting and a lift on the right breast for symmetry, but these procedures cannot be performed at the same time per Dr. Marla Roe. Discussed this with the patient and patient states she would like to move forward with the lift first.  Patient also expressed interest in nipple tattooing but would like some more time to think about it.   Discussed with patient that our clinic will reach out to her in regards to next steps.  Patient to call if she has any questions or concerns.  Objective findings and plan discussed with Dr. Marla Roe.  Pictures were obtained and placed in the patient's chart with the patient's permission.   Clance Boll 03/28/2022, 12:30 PM

## 2022-03-29 ENCOUNTER — Other Ambulatory Visit: Payer: Self-pay | Admitting: Family

## 2022-03-29 ENCOUNTER — Other Ambulatory Visit: Payer: Self-pay

## 2022-03-29 DIAGNOSIS — M545 Low back pain, unspecified: Secondary | ICD-10-CM

## 2022-03-29 MED ORDER — PREGABALIN 150 MG PO CAPS
150.0000 mg | ORAL_CAPSULE | Freq: Three times a day (TID) | ORAL | 2 refills | Status: DC
  Filled 2022-03-29: qty 90, 30d supply, fill #0
  Filled 2022-05-02: qty 90, 30d supply, fill #1
  Filled 2022-05-28: qty 90, 30d supply, fill #2

## 2022-04-03 ENCOUNTER — Other Ambulatory Visit: Payer: Self-pay | Admitting: Urology

## 2022-04-04 ENCOUNTER — Other Ambulatory Visit: Payer: Self-pay

## 2022-04-05 ENCOUNTER — Other Ambulatory Visit: Payer: Self-pay

## 2022-04-05 ENCOUNTER — Inpatient Hospital Stay: Payer: No Typology Code available for payment source | Attending: Oncology

## 2022-04-05 VITALS — BP 102/65 | HR 55 | Temp 96.9°F | Resp 18

## 2022-04-05 DIAGNOSIS — Z5111 Encounter for antineoplastic chemotherapy: Secondary | ICD-10-CM | POA: Diagnosis present

## 2022-04-05 DIAGNOSIS — C50412 Malignant neoplasm of upper-outer quadrant of left female breast: Secondary | ICD-10-CM | POA: Diagnosis present

## 2022-04-05 DIAGNOSIS — Z79899 Other long term (current) drug therapy: Secondary | ICD-10-CM | POA: Insufficient documentation

## 2022-04-05 DIAGNOSIS — Z5112 Encounter for antineoplastic immunotherapy: Secondary | ICD-10-CM | POA: Insufficient documentation

## 2022-04-05 DIAGNOSIS — Z170 Estrogen receptor positive status [ER+]: Secondary | ICD-10-CM

## 2022-04-05 MED ORDER — SODIUM CHLORIDE 0.9 % IV SOLN
Freq: Once | INTRAVENOUS | Status: DC
  Filled 2022-04-05: qty 250

## 2022-04-05 MED ORDER — DIPHENHYDRAMINE HCL 25 MG PO CAPS
50.0000 mg | ORAL_CAPSULE | Freq: Once | ORAL | Status: AC
  Administered 2022-04-05: 50 mg via ORAL
  Filled 2022-04-05: qty 2

## 2022-04-05 MED ORDER — PERTUZ-TRASTUZ-HYALURON-ZZXF 60-60-2000 MG-MG-U/ML CHEMO ~~LOC~~ SOLN
10.0000 mL | Freq: Once | SUBCUTANEOUS | Status: AC
  Administered 2022-04-05: 10 mL via SUBCUTANEOUS
  Filled 2022-04-05: qty 10

## 2022-04-05 MED ORDER — ACETAMINOPHEN 325 MG PO TABS
650.0000 mg | ORAL_TABLET | Freq: Once | ORAL | Status: AC
  Administered 2022-04-05: 650 mg via ORAL
  Filled 2022-04-05: qty 2

## 2022-04-05 NOTE — Patient Instructions (Signed)
[facility] AT [place]  Discharge Instructions: Thank you for choosing [facility] to provide your oncology and hematology care.  If you have a lab appointment with the [facility], please go directly to the [facility] and check in at the registration area.  Wear comfortable clothing and clothing appropriate for easy access to any Portacath or PICC line.   We strive to give you quality time with your provider. You may need to reschedule your appointment if you arrive late (15 or more minutes).  Arriving late affects you and other patients whose appointments are after yours.  Also, if you miss three or more appointments without notifying the office, you may be dismissed from the clinic at the provider's discretion.      For prescription refill requests, have your pharmacy contact our office and allow 72 hours for refills to be completed.    Today you received the following chemotherapy and/or immunotherapy agents PHESGO      To help prevent nausea and vomiting after your treatment, we encourage you to take your nausea medication as directed.  BELOW ARE SYMPTOMS THAT SHOULD BE REPORTED IMMEDIATELY: *FEVER GREATER THAN 100.4 F (38 C) OR HIGHER *CHILLS OR SWEATING *NAUSEA AND VOMITING THAT IS NOT CONTROLLED WITH YOUR NAUSEA MEDICATION *UNUSUAL SHORTNESS OF BREATH *UNUSUAL BRUISING OR BLEEDING *URINARY PROBLEMS (pain or burning when urinating, or frequent urination) *BOWEL PROBLEMS (unusual diarrhea, constipation, pain near the anus) TENDERNESS IN MOUTH AND THROAT WITH OR WITHOUT PRESENCE OF ULCERS (sore throat, sores in mouth, or a toothache) UNUSUAL RASH, SWELLING OR PAIN  UNUSUAL VAGINAL DISCHARGE OR ITCHING   Items with * indicate a potential emergency and should be followed up as soon as possible or go to the Emergency Department if any problems should occur.  Please show the CHEMOTHERAPY ALERT CARD or IMMUNOTHERAPY ALERT CARD at check-in to the  Emergency Department and triage nurse.  Should you have questions after your visit or need to cancel or reschedule your appointment, please contact [facility] [place] AT [place]  [phone] and follow the prompts.  Office hours are 8:00 a.m. to 4:30 p.m. Monday - Friday. Please note that voicemails left after 4:00 p.m. may not be returned until the following business day.  We are closed weekends and major holidays. You have access to a nurse at all times for urgent questions. Please call the main number to the clinic [phone] and follow the prompts.  For any non-urgent questions, you may also contact your provider using MyChart. We now offer e-Visits for anyone 57 and older to request care online for non-urgent symptoms. For details visit [email or web address].   Also download the MyChart app! Go to the app store, search "MyChart", open the app, select [facility], and log in with your MyChart username and password.  Masks are optional in the cancer centers. If you would like for your care team to wear a mask while they are taking care of you, please let them know. For doctor visits, patients may have with them one support person who is at least 52 years old. At this time, visitors are not allowed in the infusion area.  Pertuzumab; Trastuzumab; Hyaluronidase Injection What is this medication? PERTUZUMAB (per TOOZ ue mab); TRASTUZUMAB (tras TOO zoo mab); HYALURONIDASE (hye al ur ON i dase) is used to treat breast cancer. Pertuzumab and trastuzumab are a monoclonal antibodies. Hyaluronidase is used to improve the effects of pertuzumab and trastuzumab. This medicine may be used for other  purposes; ask your health care provider or pharmacist if you have questions. COMMON BRAND NAME(S): PHESGO What should I tell my care team before I take this medication? They need to know if you have any of these conditions: heart disease high blood pressure history of irregular heartbeat lung  or breathing disease, like asthma an unusual or allergic reaction to pertuzumab, trastuzumab, hyaluronidase, other medicines, foods, dyes, or preservatives pregnant or trying to get pregnant breast-feeding How should I use this medication? This medicine is for injection under the skin. It may be given by a health care professional in a hospital or clinic setting or a health care professional may give you this medicine at home. Talk to your pediatrician regarding the use of this medicine in children. Special care may be needed. Overdosage: If you think you have taken too much of this medicine contact a poison control center or emergency room at once. NOTE: This medicine is only for you. Do not share this medicine with others. What if I miss a dose? It is important not to miss your dose. Call your doctor or health care professional if you are unable to keep an appointment. What may interact with this medication? This medicine may interact with the following medications: certain types of chemotherapy, such as daunorubicin, doxorubicin, epirubicin, and idarubicin This list may not describe all possible interactions. Give your health care provider a list of all the medicines, herbs, non-prescription drugs, or dietary supplements you use. Also tell them if you smoke, drink alcohol, or use illegal drugs. Some items may interact with your medicine. What should I watch for while using this medication? Visit your health care professional for regular checks on your progress. Tell your health care professional if your symptoms do not start to get better or if they get worse. Your condition will be monitored carefully while you are receiving this medicine. Do not become pregnant while taking this medicine or for 7 months after stopping it. Women should inform their doctor if they wish to become pregnant or think they might be pregnant. There is a potential for serious side effects to an unborn child. Talk to  your health care professional or pharmacist for more information. What side effects may I notice from receiving this medication? Side effects that you should report to your doctor or health care professional as soon as possible: allergic reactions like skin rash, itching or hives, swelling of the face, lips, or tongue breathing problems chest pain cough dizziness feeling faint; lightheaded, falls fever or chills loss of consciousness pain, tingling, numbness in the hands or feet palpitations sudden weight gain swelling of the ankles or legs unusually weak or tired Side effects that usually do not require medical attention (report these to your doctor or health care professional if they continue or are bothersome): diarrhea hair loss joint pain muscle pain nausea, vomiting pain, redness, or irritation at site where injected tiredness This list may not describe all possible side effects. Call your doctor for medical advice about side effects. You may report side effects to FDA at 1-800-FDA-1088. Where should I keep my medication? Keep out of the reach of children. If you are using this medicine at home, you will be instructed on how to store it. Throw away any unused medicine after the expiration date on the label. NOTE: This sheet is a summary. It may not cover all possible information. If you have questions about this medicine, talk to your doctor, pharmacist, or health care provider.  2023  Elsevier/Gold Standard (2021-09-09 00:00:00)

## 2022-04-07 ENCOUNTER — Other Ambulatory Visit: Payer: Self-pay

## 2022-04-12 ENCOUNTER — Inpatient Hospital Stay: Payer: No Typology Code available for payment source

## 2022-04-12 DIAGNOSIS — C50412 Malignant neoplasm of upper-outer quadrant of left female breast: Secondary | ICD-10-CM

## 2022-04-12 DIAGNOSIS — Z5111 Encounter for antineoplastic chemotherapy: Secondary | ICD-10-CM | POA: Diagnosis not present

## 2022-04-12 MED ORDER — GOSERELIN ACETATE 3.6 MG ~~LOC~~ IMPL
3.6000 mg | DRUG_IMPLANT | SUBCUTANEOUS | Status: DC
  Administered 2022-04-12: 3.6 mg via SUBCUTANEOUS
  Filled 2022-04-12: qty 3.6

## 2022-04-17 ENCOUNTER — Other Ambulatory Visit: Payer: Self-pay

## 2022-04-19 ENCOUNTER — Other Ambulatory Visit: Payer: Self-pay

## 2022-04-19 ENCOUNTER — Other Ambulatory Visit: Payer: Self-pay | Admitting: Oncology

## 2022-04-19 MED ORDER — OXYCODONE HCL 10 MG PO TABS
10.0000 mg | ORAL_TABLET | ORAL | 0 refills | Status: DC
  Filled 2022-04-19: qty 30, 30d supply, fill #0

## 2022-04-20 ENCOUNTER — Telehealth: Payer: Self-pay | Admitting: Plastic Surgery

## 2022-04-20 NOTE — Telephone Encounter (Signed)
Authorization initiated with [employer] and faxed off to [phone]

## 2022-04-21 ENCOUNTER — Encounter: Payer: Self-pay | Admitting: Urology

## 2022-04-21 ENCOUNTER — Encounter: Payer: Self-pay | Admitting: Family

## 2022-04-24 ENCOUNTER — Other Ambulatory Visit: Payer: Self-pay

## 2022-04-24 ENCOUNTER — Ambulatory Visit: Payer: No Typology Code available for payment source | Attending: Surgery

## 2022-04-24 VITALS — Wt 111.2 lb

## 2022-04-24 DIAGNOSIS — Z483 Aftercare following surgery for neoplasm: Secondary | ICD-10-CM | POA: Insufficient documentation

## 2022-04-24 NOTE — Therapy (Signed)
OUTPATIENT PHYSICAL THERAPY SOZO SCREENING NOTE   Patient Name: Tricia Berry MRN: [id number] DOB:[birth year], 51 y.o., female Today's Date: 04/24/2022  PCP: Burnard Hawthorne, FNP REFERRING PROVIDER: Erroll Luna, MD   PT End of Session - 04/24/22 1039     Visit Number 22   # unchanged due to screen only   PT Start Time 62    PT Stop Time 1044    PT Time Calculation (min) 8 min    Activity Tolerance Patient tolerated treatment well    Behavior During Therapy WFL for tasks assessed/performed             Past Medical History:  Diagnosis Date   Anxiety    Breast cancer ([place])    Diverticulitis    Family history of ovarian cancer    GERD (gastroesophageal reflux disease)    IBS (irritable bowel syndrome)    PONV (postoperative nausea and vomiting)    severe migraine and vomiting post anesthesia   Scoliosis    Past Surgical History:  Procedure Laterality Date   ABDOMINAL HYSTERECTOMY     still has ovaries, no gyn cancer, hysterectomy due to endometriosis. NO cervix on exam 01/10/21   BREAST BIOPSY Left 09/15/2020   [place] bx of mass, path pending, Q marker   BREAST BIOPSY Left 09/15/2020   [place] bx of LN, hydromarker, path pending   BREAST RECONSTRUCTION WITH PLACEMENT OF TISSUE EXPANDER AND FLEX HD (ACELLULAR HYDRATED DERMIS) Bilateral 03/17/2021   Procedure: IMMEDIATE BILATERAL BREAST RECONSTRUCTION WITH PLACEMENT OF TISSUE EXPANDER AND FLEX HD (ACELLULAR HYDRATED DERMIS);  Surgeon: Wallace Going, DO;  Location: [place];  Service: Plastics;  Laterality: Bilateral;   COLONOSCOPY WITH PROPOFOL N/A 11/21/2021   Procedure: COLONOSCOPY WITH PROPOFOL;  Surgeon: Lin Landsman, MD;  Location: [facility] ENDOSCOPY;  Service: Gastroenterology;  Laterality: N/A;   IR IMAGING GUIDED PORT INSERTION  10/01/2020   MODIFIED MASTECTOMY Left 03/17/2021   Procedure: LEFT MODIFIED RADICAL MASTECTOMY;  Surgeon: Erroll Luna, MD;  Location: [place];  Service: General;  Laterality: Left;   PORTA CATH REMOVAL Right 03/17/2021   Procedure: PORTA CATH REMOVAL;  Surgeon: Erroll Luna, MD;  Location: [place];  Service: General;  Laterality: Right;   REMOVAL OF BILATERAL TISSUE EXPANDERS WITH PLACEMENT OF BILATERAL BREAST IMPLANTS Bilateral 05/09/2021   Procedure: REMOVAL OF BILATERAL TISSUE EXPANDERS WITH PLACEMENT OF BILATERAL BREAST IMPLANTS;  Surgeon: Wallace Going, DO;  Location: [place];  Service: Plastics;  Laterality: Bilateral;  90 min   TOTAL MASTECTOMY Right 03/17/2021   Procedure: TOTAL MASTECTOMY;  Surgeon: Erroll Luna, MD;  Location: [place];  Service: General;  Laterality: Right;   Patient Active Problem List   Diagnosis Date Noted   Family history of heart disease 11/16/2021   Acquired absence of breast 08/16/2021   Atherosclerosis of aorta ([place]) 07/20/2021   Hepatic steatosis 07/20/2021   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[place]) 03/14/2021   Genetic testing 11/22/2020   Family history of ovarian cancer    Candidal vulvovaginitis 11/01/2020   Low back pain 10/06/2020   Encounter for medical examination to establish care 09/26/2020   Malignant neoplasm of upper-outer quadrant of left breast in female, estrogen receptor positive ([place]) 09/26/2020   BACK STRAIN, LUMBAR 03/08/2010   INTERNAL HEMORRHOIDS 01/27/2010   ANAL FISSURE 01/27/2010   OSTEOARTHRITIS, HANDS, BILATERAL 01/04/2010   Arthralgia 10/05/2009   INSOMNIA, CHRONIC 05/06/2009   ALLERGIC RHINITIS, SEASONAL 02/09/2009   TOBACCO  ABUSE 12/29/2008   IBS 10/13/2008   Anxiety state 08/25/2008   DEPRESSION, RECURRENT 08/25/2008    REFERRING DIAG: left breast cancer at risk for lymphedema  THERAPY DIAG:  Aftercare following surgery for neoplasm  PERTINENT HISTORY: L breast cancer ER+/PR+ HER2-, stage III, underwent a bilateral mastectomy and ALND on L (7/16) on 03/17/21, reconstruction in summer  2022, neuropathy of hands, lower legs and feet, completed radiation, completed radiation Dec17, 2021 to February 18, 2021  PRECAUTIONS: left UE Lymphedema risk, None  SUBJECTIVE: Pt returns for her 3 month L-Dex screen.   PAIN:  Are you having pain? No  SOZO SCREENING: Patient was assessed today using the SOZO machine to determine the lymphedema index score. This was compared to her baseline score. It was determined that she is within the recommended range when compared to her baseline and no further action is needed at this time. She will continue SOZO screenings. These are done every 3 months for 2 years post operatively followed by every 6 months for 2 years, and then annually.  Plan: Cont every 3 month L-Dex screens for up to 2 years from her ALND (~03/18/2023)   Otelia Limes, PTA 04/24/2022, 10:44 AM

## 2022-04-26 ENCOUNTER — Encounter: Payer: Self-pay | Admitting: Oncology

## 2022-04-26 ENCOUNTER — Inpatient Hospital Stay (HOSPITAL_BASED_OUTPATIENT_CLINIC_OR_DEPARTMENT_OTHER): Payer: No Typology Code available for payment source | Admitting: Oncology

## 2022-04-26 ENCOUNTER — Inpatient Hospital Stay: Payer: No Typology Code available for payment source | Attending: Oncology

## 2022-04-26 ENCOUNTER — Inpatient Hospital Stay: Payer: No Typology Code available for payment source

## 2022-04-26 ENCOUNTER — Other Ambulatory Visit: Payer: Self-pay

## 2022-04-26 VITALS — BP 95/60 | HR 66 | Temp 96.8°F | Resp 16 | Ht 60.0 in | Wt 112.7 lb

## 2022-04-26 DIAGNOSIS — Z79899 Other long term (current) drug therapy: Secondary | ICD-10-CM | POA: Diagnosis not present

## 2022-04-26 DIAGNOSIS — C50412 Malignant neoplasm of upper-outer quadrant of left female breast: Secondary | ICD-10-CM | POA: Diagnosis present

## 2022-04-26 DIAGNOSIS — Z5112 Encounter for antineoplastic immunotherapy: Secondary | ICD-10-CM

## 2022-04-26 DIAGNOSIS — Z170 Estrogen receptor positive status [ER+]: Secondary | ICD-10-CM

## 2022-04-26 DIAGNOSIS — T451X5A Adverse effect of antineoplastic and immunosuppressive drugs, initial encounter: Secondary | ICD-10-CM

## 2022-04-26 DIAGNOSIS — G620 Drug-induced polyneuropathy: Secondary | ICD-10-CM

## 2022-04-26 DIAGNOSIS — Z7983 Long term (current) use of bisphosphonates: Secondary | ICD-10-CM

## 2022-04-26 DIAGNOSIS — Z5181 Encounter for therapeutic drug level monitoring: Secondary | ICD-10-CM | POA: Diagnosis not present

## 2022-04-26 DIAGNOSIS — Z79811 Long term (current) use of aromatase inhibitors: Secondary | ICD-10-CM

## 2022-04-26 LAB — CBC WITH DIFFERENTIAL/PLATELET
Abs Immature Granulocytes: 0.01 10*3/uL (ref 0.00–0.07)
Basophils Absolute: 0 10*3/uL (ref 0.0–0.1)
Basophils Relative: 1 %
Eosinophils Absolute: 0.1 10*3/uL (ref 0.0–0.5)
Eosinophils Relative: 2 %
HCT: 38.6 % (ref 36.0–46.0)
Hemoglobin: 12.3 g/dL (ref 12.0–15.0)
Immature Granulocytes: 0 %
Lymphocytes Relative: 38 %
Lymphs Abs: 2.4 10*3/uL (ref 0.7–4.0)
MCH: 29.2 pg (ref 26.0–34.0)
MCHC: 31.9 g/dL (ref 30.0–36.0)
MCV: 91.7 fL (ref 80.0–100.0)
Monocytes Absolute: 0.6 10*3/uL (ref 0.1–1.0)
Monocytes Relative: 10 %
Neutro Abs: 3.2 10*3/uL (ref 1.7–7.7)
Neutrophils Relative %: 49 %
Platelets: 191 10*3/uL (ref 150–400)
RBC: 4.21 MIL/uL (ref 3.87–5.11)
RDW: 15.4 % (ref 11.5–15.5)
WBC: 6.4 10*3/uL (ref 4.0–10.5)
nRBC: 0 % (ref 0.0–0.2)

## 2022-04-26 LAB — COMPREHENSIVE METABOLIC PANEL
ALT: 23 U/L (ref 0–44)
AST: 29 U/L (ref 15–41)
Albumin: 3.7 g/dL (ref 3.5–5.0)
Alkaline Phosphatase: 63 U/L (ref 38–126)
Anion gap: 6 (ref 5–15)
BUN: <5 mg/dL — ABNORMAL LOW (ref 6–20)
CO2: 28 mmol/L (ref 22–32)
Calcium: 9 mg/dL (ref 8.9–10.3)
Chloride: 103 mmol/L (ref 98–111)
Creatinine, Ser: 0.99 mg/dL (ref 0.44–1.00)
GFR, Estimated: >60 mL/min (ref >60)
Glucose, Bld: 117 mg/dL — ABNORMAL HIGH (ref 70–99)
Potassium: 3.8 mmol/L (ref 3.5–5.1)
Sodium: 137 mmol/L (ref 135–145)
Total Bilirubin: 0.5 mg/dL (ref 0.3–1.2)
Total Protein: 6.9 g/dL (ref 6.5–8.1)

## 2022-04-26 MED ORDER — SODIUM CHLORIDE 0.9 % IV SOLN
Freq: Once | INTRAVENOUS | Status: DC
  Filled 2022-04-26: qty 250

## 2022-04-26 MED ORDER — TRIMETHOPRIM 100 MG PO TABS
100.0000 mg | ORAL_TABLET | Freq: Every day | ORAL | 3 refills | Status: DC
  Filled 2022-04-26: qty 90, 90d supply, fill #0
  Filled 2022-07-27: qty 90, 90d supply, fill #1
  Filled 2022-10-29: qty 90, 90d supply, fill #2
  Filled 2023-02-04: qty 90, 90d supply, fill #3

## 2022-04-26 MED ORDER — PERTUZ-TRASTUZ-HYALURON-ZZXF 60-60-2000 MG-MG-U/ML CHEMO ~~LOC~~ SOLN
10.0000 mL | Freq: Once | SUBCUTANEOUS | Status: AC
  Administered 2022-04-26: 10 mL via SUBCUTANEOUS
  Filled 2022-04-26: qty 10

## 2022-04-26 MED ORDER — ANASTROZOLE 1 MG PO TABS
1.0000 mg | ORAL_TABLET | Freq: Every day | ORAL | 3 refills | Status: DC
  Filled 2022-04-26: qty 30, 30d supply, fill #0
  Filled 2022-05-28: qty 30, 30d supply, fill #1
  Filled 2022-06-26: qty 30, 30d supply, fill #2
  Filled 2022-07-27: qty 30, 30d supply, fill #3

## 2022-04-26 MED ORDER — DIPHENHYDRAMINE HCL 25 MG PO CAPS
50.0000 mg | ORAL_CAPSULE | Freq: Once | ORAL | Status: AC
  Administered 2022-04-26: 50 mg via ORAL
  Filled 2022-04-26: qty 2

## 2022-04-26 MED ORDER — ACETAMINOPHEN 325 MG PO TABS
650.0000 mg | ORAL_TABLET | Freq: Once | ORAL | Status: AC
  Administered 2022-04-26: 650 mg via ORAL
  Filled 2022-04-26: qty 2

## 2022-04-26 NOTE — Progress Notes (Signed)
Hematology/Oncology Consult note [facility]  Telephone:([phone] [phone]  Patient Care Team: Burnard Hawthorne, FNP as PCP - General (Family Medicine) Kate Sable, MD as PCP - Cardiology (Cardiology) Rico Junker, RN as Oncology Nurse Navigator Sindy Guadeloupe, MD as Consulting Physician (Hematology and Oncology)   Name of the patient: Tricia Berry  [id number]  [birth year]   Date of visit: 04/26/22  Diagnosis- invasive mammary carcinoma of the left breast stage III ypT2 ypN2 cM0 ER/PR positive and HER2 positive by [facility]  Chief complaint/ Reason for visit-on treatment assessment prior to cycle 14 of adjuvant Herceptin and Perjeta  Heme/Onc history: 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  She then noticed that her lump is gradually getting larger with an area of ulceration on the skin and underwent a diagnostic bilateral mammogram on 09/08/2020 which showed hypoechoic interconnected masses in the left breast from 10:00 to 2 o'clock position spanning at least an area of 4.8 cm.  Ultrasound also showed 5 abnormal lymph nodes in the axilla with cortical thickening.  Both the mass and the lymph nodes were biopsied and was consistent with invasive mammary carcinoma grade 2.  Tumor was ER +91- 100%, PR +11 to 20% and HER-2 negative.  Ki-67 15%.   PET CT scan showed hypermetabolism in the area of the left breast but no evidence of hypermetabolism in the left axilla Or evidence of distant metastatic disease.   Neoadjuvant dose dense ACT chemotherapy started on 10/08/2020. Interim ultrasound after 4 cycles of dose dense AC chemotherapy showed overall decrease in volume of the tumor.  Nodularity previously seen on ultrasound was also decreased in size.   Patient  completed neoadjuvant AC Taxol chemotherapy and underwent bilateral mastectomy with reconstruction.Final pathology showed fibrocystic changes in the right breast with no malignancy.  3.6 cm invasive mammary carcinoma in the left breast.  7 out of 16 lymph nodes positive for malignancy.  Treatment effect in the breast present but not robust.  Treatment effect in the lymph nodes minimal.  Margins negative.  Overall grade 2.  Extranodal extension present.  ER greater than 90% positive, PR 15% positive.  HER2 +2 equivocal and positive by FISH on the primary breast specimen.  HER2 testing was however negative in the lymph nodes.  Patient started on adjuvant Herceptin and Perjeta since July 2022.     Interval history-has baseline chronic back pain as well as tingling numbness in her hands and feet.  She is on Lyrica 150 mg 3 times a day for her neuropathy.  She is gradually weaning off her oxycodone.  She does report some imbalance while walking.  She has not had any falls.  Also states that exemestane made her existing arthralgias worse and she prefers to go back on Arimidex  ECOG PS- 1 Pain scale- 4 Opioid associated constipation- no  Review of systems- Review of Systems  Constitutional:  Positive for malaise/fatigue. Negative for chills, fever and weight loss.  HENT:  Negative for congestion, ear discharge and nosebleeds.   Eyes:  Negative for blurred vision.  Respiratory:  Negative for cough, hemoptysis, sputum production, shortness of breath and wheezing.   Cardiovascular:  Negative for chest pain, palpitations, orthopnea and claudication.  Gastrointestinal:  Negative for abdominal pain, blood in  stool, constipation, diarrhea, heartburn, melena, nausea and vomiting.  Genitourinary:  Negative for dysuria, flank pain, frequency, hematuria and urgency.  Musculoskeletal:  Positive for back pain. Negative for joint pain and myalgias.  Skin:  Negative for rash.  Neurological:  Positive for sensory change  (peripheral neuropathy). Negative for dizziness, tingling, focal weakness, seizures, weakness and headaches.  Endo/Heme/Allergies:  Does not bruise/bleed easily.  Psychiatric/Behavioral:  Negative for depression and suicidal ideas. The patient does not have insomnia.       Allergies  Allergen Reactions   Morphine Nausea And Vomiting and Other (See Comments)    migranes Other reaction(s): Headache Migraine, vomiting   Sertraline Hcl     REACTION: Worsened symptoms of IBS   Sulfa Antibiotics Rash   Sulfamethoxazole Rash   Sulfonamide Derivatives Rash     Past Medical History:  Diagnosis Date   Anxiety    Breast cancer ([place])    Diverticulitis    Family history of ovarian cancer    GERD (gastroesophageal reflux disease)    IBS (irritable bowel syndrome)    PONV (postoperative nausea and vomiting)    severe migraine and vomiting post anesthesia   Scoliosis      Past Surgical History:  Procedure Laterality Date   ABDOMINAL HYSTERECTOMY     still has ovaries, no gyn cancer, hysterectomy due to endometriosis. NO cervix on exam 01/10/21   BREAST BIOPSY Left 09/15/2020   [place] bx of mass, path pending, Q marker   BREAST BIOPSY Left 09/15/2020   [place] bx of LN, hydromarker, path pending   BREAST RECONSTRUCTION WITH PLACEMENT OF TISSUE EXPANDER AND FLEX HD (ACELLULAR HYDRATED DERMIS) Bilateral 03/17/2021   Procedure: IMMEDIATE BILATERAL BREAST RECONSTRUCTION WITH PLACEMENT OF TISSUE EXPANDER AND FLEX HD (ACELLULAR HYDRATED DERMIS);  Surgeon: Wallace Going, DO;  Location: [place];  Service: Plastics;  Laterality: Bilateral;   COLONOSCOPY WITH PROPOFOL N/A 11/21/2021   Procedure: COLONOSCOPY WITH PROPOFOL;  Surgeon: Lin Landsman, MD;  Location: [facility] ENDOSCOPY;  Service: Gastroenterology;  Laterality: N/A;   IR IMAGING GUIDED PORT INSERTION  10/01/2020   MODIFIED MASTECTOMY Left 03/17/2021   Procedure: LEFT MODIFIED RADICAL MASTECTOMY;  Surgeon: Erroll Luna,  MD;  Location: [place];  Service: General;  Laterality: Left;   PORTA CATH REMOVAL Right 03/17/2021   Procedure: PORTA CATH REMOVAL;  Surgeon: Erroll Luna, MD;  Location: [place];  Service: General;  Laterality: Right;   REMOVAL OF BILATERAL TISSUE EXPANDERS WITH PLACEMENT OF BILATERAL BREAST IMPLANTS Bilateral 05/09/2021   Procedure: REMOVAL OF BILATERAL TISSUE EXPANDERS WITH PLACEMENT OF BILATERAL BREAST IMPLANTS;  Surgeon: Wallace Going, DO;  Location: [place];  Service: Plastics;  Laterality: Bilateral;  90 min   TOTAL MASTECTOMY Right 03/17/2021   Procedure: TOTAL MASTECTOMY;  Surgeon: Erroll Luna, MD;  Location: [place];  Service: General;  Laterality: Right;    Social History   Socioeconomic History   Marital status: Divorced    Spouse name: Not on file   Number of children: 2   Years of education: Not on file   Highest education level: Not on file  Occupational History   Occupation: Nurse    Employer: [employer]  Tobacco Use   Smoking status: Every Day    Packs/day: 0.25    Types: Cigarettes   Smokeless tobacco: Never   Tobacco comments:    Pt trying to quit now. Has reduced amount significantly  Vaping Use  Vaping Use: Never used  Substance and Sexual Activity   Alcohol use: Not Currently   Drug use: No   Sexual activity: Not Currently    Birth control/protection: None  Other Topics Concern   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 She and her spouse are primary caregivers.    Social Determinants of Health   Financial Resource Strain: Not on file  Food Insecurity: Not on file  Transportation Needs: Not on file  Physical Activity: Not on file  Stress: Not on file  Social Connections: Not on file  Intimate Partner Violence: Not on file    Family History  Problem Relation Age of Onset    Hypertension Mother    Osteoarthritis Mother    Diverticulitis Mother    Heart failure Father    Hypertension Father    Gout Father    Heart attack Father 20   Diverticulitis Brother    Ovarian cancer Paternal Grandmother      Current Outpatient Medications:    anastrozole (ARIMIDEX) 1 MG tablet, Take 1 tablet (1 mg total) by mouth daily., Disp: 30 tablet, Rfl: 3   cyanocobalamin (,VITAMIN B-12,) 1000 MCG/ML injection, 1000 mcg (1 mL) intramuscular injection in the thigh ( vastus lateralis) once per month., Disp: 3 mL, Rfl: 4   FLUoxetine (PROZAC) 40 MG capsule, Take 1 capsule (40 mg total) by mouth every morning., Disp: 90 capsule, Rfl: 1   Lactobacillus (PROBIOTIC ACIDOPHILUS PO), Take 1 capsule by mouth daily., Disp: , Rfl:    lidocaine-prilocaine (EMLA) cream, Apply 1 application topically as needed., Disp: 30 g, Rfl: 0   LORazepam (ATIVAN) 0.5 MG tablet, Take 1 tablet (0.5 mg total) by mouth at bedtime as needed for up to 30 doses for anxiety., Disp: 30 tablet, Rfl: 0   Multiple Vitamins-Minerals (MULTIVITAL PO), Take 1 Dose by mouth daily., Disp: , Rfl:    nicotine (NICODERM CQ - DOSED IN MG/24 HOURS) 21 mg/24hr patch, use 1 patch daily, Disp: 28 patch, Rfl: 0   nicotine polacrilex (SM NICOTINE) 2 MG lozenge, Take by mouth., Disp: 72 lozenge, Rfl: 0   ondansetron (ZOFRAN) 8 MG tablet, Take 1 tablet (8 mg total) by mouth every 8 (eight) hours as needed for nausea or vomiting., Disp: 60 tablet, Rfl: 0   Oxycodone HCl 10 MG TABS, Take 1 tablet (10 mg total) by mouth as directed., Disp: 30 tablet, Rfl: 0   pregabalin (LYRICA) 150 MG capsule, Take 1 capsule (150 mg total) by mouth in the morning, at noon, and at bedtime., Disp: 90 capsule, Rfl: 2   rosuvastatin (CRESTOR) 5 MG tablet, Take 1 tablet (5 mg total) by mouth every evening., Disp: 90 tablet, Rfl: 3   tiZANidine (ZANAFLEX) 4 MG tablet, Take 1 tablet (4 mg total) by mouth 3 (three) times daily., Disp: 30 tablet, Rfl: 0    trimethoprim (TRIMPEX) 100 MG tablet, Take 1 tablet (100 mg total) by mouth daily., Disp: 90 tablet, Rfl: 3   traZODone (DESYREL) 50 MG tablet, Take 0.5-1 tablets (25-50 mg total) by mouth at bedtime as needed for sleep. (Patient not taking: Reported on 04/26/2022), Disp: 30 tablet, Rfl: 3   Vibegron (GEMTESA) 75 MG TABS, Take 75 mg by mouth daily. (Patient not taking: Reported on 04/26/2022), Disp: 30 tablet, Rfl: 11 No current facility-administered medications for this visit.  Facility-Administered Medications Ordered in Other Visits:  0.9 %  sodium chloride infusion, , Intravenous, Once, Sindy Guadeloupe, MD   goserelin (ZOLADEX) injection 3.6 mg, 3.6 mg, Subcutaneous, Q28 days, Sindy Guadeloupe, MD, 3.6 mg at 05/04/21 1150   heparin lock flush 100 unit/mL, 500 Units, Intravenous, Once, Sindy Guadeloupe, MD  Physical exam:  Vitals:   04/26/22 1348  BP: 95/60  Pulse: 66  Resp: 16  Temp: (!) 96.8 F (36 C)  TempSrc: Tympanic  SpO2: 96%  Weight: 112 lb 11.2 oz (51.1 kg)  Height: 5' (1.524 m)   Physical Exam Constitutional:      General: She is not in acute distress. Cardiovascular:     Rate and Rhythm: Normal rate and regular rhythm.     Heart sounds: Normal heart sounds.  Pulmonary:     Effort: Pulmonary effort is normal.     Breath sounds: Normal breath sounds.  Abdominal:     General: Bowel sounds are normal.     Palpations: Abdomen is soft.  Skin:    General: Skin is warm and dry.  Neurological:     General: No focal deficit present.     Mental Status: She is alert and oriented to person, place, and time.         Latest Ref Rng & Units 04/26/2022    1:41 PM  CMP  Glucose 70 - 99 mg/dL 117   BUN 6 - 20 mg/dL <5   Creatinine 0.44 - 1.00 mg/dL 0.99   Sodium 135 - 145 mmol/L 137   Potassium 3.5 - 5.1 mmol/L 3.8   Chloride 98 - 111 mmol/L 103   CO2 22 - 32 mmol/L 28   Calcium 8.9 - 10.3 mg/dL 9.0   Total Protein 6.5 - 8.1 g/dL 6.9   Total Bilirubin 0.3 - 1.2 mg/dL 0.5    Alkaline Phos 38 - 126 U/L 63   AST 15 - 41 U/L 29   ALT 0 - 44 U/L 23       Latest Ref Rng & Units 04/26/2022    1:41 PM  CBC  WBC 4.0 - 10.5 K/uL 6.4   Hemoglobin 12.0 - 15.0 g/dL 12.3   Hematocrit 36.0 - 46.0 % 38.6   Platelets 150 - 400 K/uL 191      Assessment and plan- Patient is a 51 y.o. female who is here for following issues:  Stage III left breast cancer: Patient completed neoadjuvant chemotherapy and was then noted to have residual disease both in her primary breast mass as well as lymph nodes.  Primary breast mass biomarker testing was ER positive and HER2 positive but lymph node testing was ER positive and HER2 negative.  She is now finishing up adjuvant Herceptin and Perjeta.  This is cycle 14 today.  She will directly proceed for cycle 15 in 3 weeks and I will see her back in 6 weeks for cycle 16.  Plan is to complete 18 cycles.  She will need an echocardiogram prior.  2.  Chemo-induced peripheral neuropathy: She is currently on Lyrica 150 mg 3 times a day which is gradually being uptitrated by her PCP Faylene Kurtz.  We are also trying to get her off oxycodone which she has been on for 2 years now.  She will be getting her last prescription in 4 weeks from now.  She is still waiting to hear back from pain clinic.  I would like to refer her to acupuncture.  I will also refer her to Dr. Mickeal Skinner for  further management of her neuropathy  3.  Chronic back pain: This has been an ongoing issue for a while and also had an MRI lumbar spine in December 2022 which did not show any evidence of metastatic disease.  4.  Ovarian suppression: Patient is s/p hysterectomy but has ovaries in situ and hormone levels were not conclusively menopausal prior to initiating treatment.  She has been on goserelin every 3 months.  She last received her dose in June 2023.  I will plan to hold off on further dosing at this time and see if she has naturally entered menopause based on her hormone levels.  I  am switching her back from exam is changed to Arimidex at this time based on her tolerance.  5.  Patient is also receiving Zometa every 3 months as adjuvant treatment for high risk breast cancer and she will be due for her next dose in early August 2023.  6.  I have not started the patient on adjuvant abemaciclib initially because her Ki-67 was not more than 20% which was the previous requirement for approval.  However presently it is approved for ER positive HER2 negative breast cancer regardless of GI 27.  I am therefore looking intoAbemaciclib given 150 mg twice daily for 2 years.  Discussed risks and benefits of abemaciclib including all but not limited to nausea vomiting diarrhea low blood counts and risk of infections.  Treatment will be given with a curative intent.  Patient understands and agrees to proceed as planned.  We will work on Government social research officer for the same.  7.  Gait and balance: Likely secondary to neuropathy.  However I will get MRI brain with and without contrast to rule out metastatic disease given HER2 positive breast cancer has a predilection to go to the main   Visit Diagnosis 1. Malignant neoplasm of upper-outer quadrant of left breast in female, estrogen receptor positive ([place])   2. Chemotherapy-induced peripheral neuropathy ([place])   3. Encounter for monoclonal antibody treatment for malignancy   4. Visit for monitoring Arimidex therapy   5. Encounter for monitoring zoledronic acid therapy      Dr. Randa Evens, MD, MPH [facility] [phone] 04/26/2022 3:30 PM

## 2022-04-26 NOTE — Patient Instructions (Signed)
[facility] AT [facility]  Discharge Instructions: Thank you for choosing [facility] to provide your oncology and hematology care.   If you have a lab appointment with the [facility], please go directly to the [facility] and check in at the registration area.   Wear comfortable clothing and clothing appropriate for easy access to any Portacath or PICC line.   We strive to give you quality time with your provider. You may need to reschedule your appointment if you arrive late (15 or more minutes).  Arriving late affects you and other patients whose appointments are after yours.  Also, if you miss three or more appointments without notifying the office, you may be dismissed from the clinic at the provider's discretion.      For prescription refill requests, have your pharmacy contact our office and allow 72 hours for refills to be completed.    Today you received the following chemotherapy and/or immunotherapy agents       To help prevent nausea and vomiting after your treatment, we encourage you to take your nausea medication as directed.  BELOW ARE SYMPTOMS THAT SHOULD BE REPORTED IMMEDIATELY: *FEVER GREATER THAN 100.4 F (38 C) OR HIGHER *CHILLS OR SWEATING *NAUSEA AND VOMITING THAT IS NOT CONTROLLED WITH YOUR NAUSEA MEDICATION *UNUSUAL SHORTNESS OF BREATH *UNUSUAL BRUISING OR BLEEDING *URINARY PROBLEMS (pain or burning when urinating, or frequent urination) *BOWEL PROBLEMS (unusual diarrhea, constipation, pain near the anus) TENDERNESS IN MOUTH AND THROAT WITH OR WITHOUT PRESENCE OF ULCERS (sore throat, sores in mouth, or a toothache) UNUSUAL RASH, SWELLING OR PAIN  UNUSUAL VAGINAL DISCHARGE OR ITCHING   Items with * indicate a potential emergency and should be followed up as soon as possible or go to the Emergency Department if any problems should occur.  Please show the CHEMOTHERAPY ALERT CARD or IMMUNOTHERAPY ALERT CARD at check-in to the  Emergency Department and triage nurse.  Should you have questions after your visit or need to cancel or reschedule your appointment, please contact [facility]  Dept: [phone]  and follow the prompts.  Office hours are 8:00 a.m. to 4:30 p.m. Monday - Friday. Please note that voicemails left after 4:00 p.m. may not be returned until the following business day.  We are closed weekends and major holidays. You have access to a nurse at all times for urgent questions. Please call the main number to the clinic Dept: [phone] and follow the prompts.   For any non-urgent questions, you may also contact your provider using MyChart. We now offer e-Visits for anyone 76 and older to request care online for non-urgent symptoms. For details visit [email or web address].   Also download the MyChart app! Go to the app store, search "MyChart", open the app, select [facility], and log in with your MyChart username and password.  Masks are optional in the cancer centers. If you would like for your care team to wear a mask while they are taking care of you, please let them know. For doctor visits, patients may have with them one support person who is at least 52 years old. At this time, visitors are not allowed in the infusion area.

## 2022-04-28 ENCOUNTER — Telehealth: Payer: Self-pay | Admitting: Plastic Surgery

## 2022-04-28 NOTE — Telephone Encounter (Signed)
LVM to discuss surgery date

## 2022-05-01 ENCOUNTER — Other Ambulatory Visit (HOSPITAL_COMMUNITY): Payer: Self-pay

## 2022-05-01 ENCOUNTER — Encounter: Payer: Self-pay | Admitting: Oncology

## 2022-05-01 ENCOUNTER — Telehealth: Payer: Self-pay | Admitting: Pharmacist

## 2022-05-01 ENCOUNTER — Telehealth: Payer: Self-pay | Admitting: Pharmacy Technician

## 2022-05-01 DIAGNOSIS — C50412 Malignant neoplasm of upper-outer quadrant of left female breast: Secondary | ICD-10-CM

## 2022-05-01 NOTE — Telephone Encounter (Signed)
Oral Oncology Patient Advocate Encounter   Received notification that prior authorization for Verzenio is required.   PA submitted on 05/01/2022 Key [id number] Status is pending     Tricia Berry, CPhT-Adv Pharmacy Patient Advocate Specialist [facility] Patient Advocate Team Direct Number: [phone]  Fax: [phone]

## 2022-05-01 NOTE — Telephone Encounter (Signed)
Oral Oncology Pharmacist Encounter  Received new prescription for Verzenio (abemaciclib) for the adjuvant treatment of high risk breast cancer in conjunction with anastrazole, planned duration of 2 years.  Of note, patient has discordant breast mas and lymph nodes. Her breast mass is ER/PR positive and HER2 postivive and her lymph notes are ER/PR positive and HER2 negative. She is close to completing her planned course of HER2 targeted therapy.  CMP/CBC from 04/26/22 assessed, no relevant lab abnormalities. Prescription dose and frequency assessed.   Current medication list in Epic reviewed, no DDIs with abemaciclib identified:  Evaluated chart and no patient barriers to medication adherence identified.   Prescription has been e-scribed to the [facility] for benefits analysis and approval.  Oral Oncology Clinic will continue to follow for insurance authorization, copayment issues, initial counseling and start date.   Darl Pikes, PharmD, BCPS, BCOP, CPP Hematology/Oncology Clinical Pharmacist Practitioner [place]/DB/AP Oral [facility] [phone]  05/01/2022 12:19 PM

## 2022-05-01 NOTE — Telephone Encounter (Addendum)
Oral Oncology Patient Advocate Encounter  Prior Authorization for Tricia Berry has been approved.    PA#  [id number] Effective dates: 05/01/2022 through 05/01/2023  Patients co-pay is $1,923.  Patient has been enrolled in [employer] savings program.  Billing information is as follows and has been shared with [facility].   Bin: [id number] PCN: [id number] Group: [id number] ID: [id number]   Lady Deutscher, CPhT-Adv Pharmacy Patient Advocate Specialist [place] Patient Advocate Team Direct Number: [phone]  Fax: [phone]

## 2022-05-02 ENCOUNTER — Encounter: Payer: Self-pay | Admitting: Family

## 2022-05-02 ENCOUNTER — Ambulatory Visit (INDEPENDENT_AMBULATORY_CARE_PROVIDER_SITE_OTHER): Payer: No Typology Code available for payment source | Admitting: Family

## 2022-05-02 ENCOUNTER — Other Ambulatory Visit (HOSPITAL_COMMUNITY): Payer: Self-pay

## 2022-05-02 ENCOUNTER — Other Ambulatory Visit: Payer: Self-pay

## 2022-05-02 DIAGNOSIS — I700 Atherosclerosis of aorta: Secondary | ICD-10-CM | POA: Diagnosis not present

## 2022-05-02 DIAGNOSIS — M545 Low back pain, unspecified: Secondary | ICD-10-CM

## 2022-05-02 DIAGNOSIS — T451X5A Adverse effect of antineoplastic and immunosuppressive drugs, initial encounter: Secondary | ICD-10-CM

## 2022-05-02 DIAGNOSIS — G620 Drug-induced polyneuropathy: Secondary | ICD-10-CM | POA: Diagnosis not present

## 2022-05-02 DIAGNOSIS — F411 Generalized anxiety disorder: Secondary | ICD-10-CM | POA: Diagnosis not present

## 2022-05-02 DIAGNOSIS — E538 Deficiency of other specified B group vitamins: Secondary | ICD-10-CM | POA: Diagnosis not present

## 2022-05-02 MED ORDER — FLUOXETINE HCL 20 MG PO CAPS
20.0000 mg | ORAL_CAPSULE | Freq: Every morning | ORAL | 3 refills | Status: DC
  Filled 2022-05-02: qty 90, 90d supply, fill #0
  Filled 2022-07-27: qty 90, 90d supply, fill #1
  Filled 2022-10-29: qty 90, 90d supply, fill #2
  Filled 2023-01-21: qty 90, 90d supply, fill #3

## 2022-05-02 NOTE — Progress Notes (Signed)
Subjective:    Patient ID: Tricia Berry, female    DOB: 08/09/1971, 51 y.o.   MRN: [id number]  CC: Tricia Berry is a 51 y.o. female who presents today for follow up.   HPI: Feels well today No new concerns  She continues to have bilateral hand and feet neuropathy. No neuropathy in arm. Neuropathy starts in bilateral hands. She has noticed jerking motion in hands, random. No precipitating factors she has noticed. She questions if related to increase in prozac as correlates with onset of symptom.   She continues to have low back pain and arthralgias, most notable in her knees.     Anxiety-increased Prozac at previous visit to 40 mg. Anxiety had improved.  She is going stop ativan as precribed by Dr Janese Banks as doesn't feel she needs any more and had been using rarely.  Trouble staying and falling asleep. She has yet to start trazodone however plans to as she stops oxycodone.   She is following closely with Dr. Janese Banks for left breast cancer.  She is weaning off oxycodone.  She is on Lyrica 150 mg 3 times daily for neuropathy and Dr Janese Banks referred her to Dr Mickeal Skinner ( appointment 06/02/22) . She is on arimidex due to tolerance; she is no longer on exemestane.   She is awaiting an appointment with Dr Dossie Arbour , pain clinic  She has been seen by Dr Sharlet Salina , s/p radiofrequency ablation without relief. No follow up scheduled  HISTORY:  Past Medical History:  Diagnosis Date  . Anxiety   . Breast cancer ([place])   . Diverticulitis   . Family history of ovarian cancer   . GERD (gastroesophageal reflux disease)   . IBS (irritable bowel syndrome)   . PONV (postoperative nausea and vomiting)    severe migraine and vomiting post anesthesia  . Scoliosis    Past Surgical History:  Procedure Laterality Date  . ABDOMINAL HYSTERECTOMY     still has ovaries, no gyn cancer, hysterectomy due to endometriosis. NO cervix on exam 01/10/21  . BREAST BIOPSY Left 09/15/2020   [place] bx of mass, path  pending, Q marker  . BREAST BIOPSY Left 09/15/2020   [place] bx of LN, hydromarker, path pending  . BREAST RECONSTRUCTION WITH PLACEMENT OF TISSUE EXPANDER AND FLEX HD (ACELLULAR HYDRATED DERMIS) Bilateral 03/17/2021   Procedure: IMMEDIATE BILATERAL BREAST RECONSTRUCTION WITH PLACEMENT OF TISSUE EXPANDER AND FLEX HD (ACELLULAR HYDRATED DERMIS);  Surgeon: Wallace Going, DO;  Location: [place];  Service: Plastics;  Laterality: Bilateral;  . COLONOSCOPY WITH PROPOFOL N/A 11/21/2021   Procedure: COLONOSCOPY WITH PROPOFOL;  Surgeon: Lin Landsman, MD;  Location: [facility];  Service: Gastroenterology;  Laterality: N/A;  . IR IMAGING GUIDED PORT INSERTION  10/01/2020  . MODIFIED MASTECTOMY Left 03/17/2021   Procedure: LEFT MODIFIED RADICAL MASTECTOMY;  Surgeon: Erroll Luna, MD;  Location: [place];  Service: General;  Laterality: Left;  . PORTA CATH REMOVAL Right 03/17/2021   Procedure: PORTA CATH REMOVAL;  Surgeon: Erroll Luna, MD;  Location: [place];  Service: General;  Laterality: Right;  . REMOVAL OF BILATERAL TISSUE EXPANDERS WITH PLACEMENT OF BILATERAL BREAST IMPLANTS Bilateral 05/09/2021   Procedure: REMOVAL OF BILATERAL TISSUE EXPANDERS WITH PLACEMENT OF BILATERAL BREAST IMPLANTS;  Surgeon: Wallace Going, DO;  Location: [place];  Service: Plastics;  Laterality: Bilateral;  90 min  . TOTAL MASTECTOMY Right 03/17/2021   Procedure: TOTAL MASTECTOMY;  Surgeon: Erroll Luna, MD;  Location: [place];  Service: General;  Laterality: Right;   Family History  Problem Relation Age of Onset  . Hypertension Mother   . Osteoarthritis Mother   . Diverticulitis Mother   . Heart failure Father   . Hypertension Father   . Gout Father   . Heart attack Father 76  . Diverticulitis Brother   . Ovarian cancer Paternal Grandmother     Allergies: Morphine, Sertraline hcl, Sulfa antibiotics,  Sulfamethoxazole, and Sulfonamide derivatives Current Outpatient Medications on File Prior to Visit  Medication Sig Dispense Refill  . anastrozole (ARIMIDEX) 1 MG tablet Take 1 tablet (1 mg total) by mouth daily. 30 tablet 3  . cyanocobalamin (,VITAMIN B-12,) 1000 MCG/ML injection 1000 mcg (1 mL) intramuscular injection in the thigh ( vastus lateralis) once per month. 3 mL 4  . Lactobacillus (PROBIOTIC ACIDOPHILUS PO) Take 1 capsule by mouth daily.    [place] Kitchen lidocaine-prilocaine (EMLA) cream Apply 1 application topically as needed. 30 g 0  . LORazepam (ATIVAN) 0.5 MG tablet Take 1 tablet (0.5 mg total) by mouth at bedtime as needed for up to 30 doses for anxiety. 30 tablet 0  . Multiple Vitamins-Minerals (MULTIVITAL PO) Take 1 Dose by mouth daily.    . nicotine (NICODERM CQ - DOSED IN MG/24 HOURS) 21 mg/24hr patch use 1 patch daily 28 patch 0  . nicotine polacrilex (SM NICOTINE) 2 MG lozenge Take by mouth. 72 lozenge 0  . ondansetron (ZOFRAN) 8 MG tablet Take 1 tablet (8 mg total) by mouth every 8 (eight) hours as needed for nausea or vomiting. 60 tablet 0  . Oxycodone HCl 10 MG TABS Take 1 tablet (10 mg total) by mouth as directed. 30 tablet 0  . pregabalin (LYRICA) 150 MG capsule Take 1 capsule (150 mg total) by mouth in the morning, at noon, and at bedtime. 90 capsule 2  . rosuvastatin (CRESTOR) 5 MG tablet Take 1 tablet (5 mg total) by mouth every evening. 90 tablet 3  . tiZANidine (ZANAFLEX) 4 MG tablet Take 1 tablet (4 mg total) by mouth 3 (three) times daily. 30 tablet 0  . trimethoprim (TRIMPEX) 100 MG tablet Take 1 tablet (100 mg total) by mouth daily. 90 tablet 3  . traZODone (DESYREL) 50 MG tablet Take 0.5-1 tablets (25-50 mg total) by mouth at bedtime as needed for sleep. (Patient not taking: Reported on 04/26/2022) 30 tablet 3  . Vibegron (GEMTESA) 75 MG TABS Take 75 mg by mouth daily. (Patient not taking: Reported on 04/26/2022) 30 tablet 11   Current Facility-Administered Medications on  File Prior to Visit  Medication Dose Route Frequency Provider Last Rate Last Admin  . goserelin (ZOLADEX) injection 3.6 mg  3.6 mg Subcutaneous Q28 days Sindy Guadeloupe, MD   3.6 mg at 05/04/21 1150  . heparin lock flush 100 unit/mL  500 Units Intravenous Once Sindy Guadeloupe, MD        Social History   Tobacco Use  . Smoking status: Every Day    Packs/day: 0.25    Types: Cigarettes  . Smokeless tobacco: Never  . Tobacco comments:    Pt trying to quit now. Has reduced amount significantly  Vaping Use  . Vaping Use: Never used  Substance Use Topics  . Alcohol use: Not Currently  . Drug use: No    Review of Systems  Constitutional:  Negative for chills and fever.  Respiratory:  Negative for cough.   Cardiovascular:  Negative for chest pain and palpitations.  Gastrointestinal:  Negative for nausea and vomiting.  Musculoskeletal:  Positive for arthralgias and back pain.  Neurological:  Positive for numbness.      Objective:    BP 128/78 (BP Location: Right Arm, Patient Position: Sitting, Cuff Size: Small)   Pulse 65   Temp 98.6 F (37 C) (Oral)   Ht 5' (1.524 m)   Wt 112 lb (50.8 kg)   SpO2 98%   BMI 21.87 kg/m  BP Readings from Last 3 Encounters:  05/02/22 128/78  04/26/22 95/60  04/05/22 102/65   Wt Readings from Last 3 Encounters:  05/02/22 112 lb (50.8 kg)  04/26/22 112 lb 11.2 oz (51.1 kg)  04/24/22 111 lb 4 oz (50.5 kg)    Physical Exam Vitals reviewed.  Constitutional:      Appearance: She is well-developed.  Eyes:     Conjunctiva/sclera: Conjunctivae normal.  Cardiovascular:     Rate and Rhythm: Normal rate and regular rhythm.     Pulses: Normal pulses.     Heart sounds: Normal heart sounds.  Pulmonary:     Effort: Pulmonary effort is normal.     Breath sounds: Normal breath sounds. No wheezing, rhonchi or rales.  Skin:    General: Skin is warm and dry.  Neurological:     Mental Status: She is alert.     Motor: No tremor.  Psychiatric:         Speech: Speech normal.        Behavior: Behavior normal.        Thought Content: Thought content normal.       Assessment & Plan:   Problem List Items Addressed This Visit       Cardiovascular and Mediastinum   Atherosclerosis of aorta ([place])    In setting of arthralgia, advised to trial stop crestor 5mg  for 2 weeks to see if contributory. We discussed resuming once weekly with gradual increase after suspending medication for 2 weeks.         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 She has upcoming consult with Dr Mickeal Skinner, will follow. Continue lyrica 150mg  tid. Restart b12 supplementation.       Relevant Medications   FLUoxetine (PROZAC) 20 MG capsule     Other   Anxiety state    Chronic, stable. Concern for jerking sensation of hands ( not observed today) may be related to prozac. Decrease prozac to 20mg  and monitor for resolution. Advised she may start trazodone 25mg  to aid in sleep as she weans from oxycodone. Counseled on sedation medications including Lyrica, tizanidine prn, oxycodone, trazodone and the importance of not taking either these medications at the same time or together.  She understands risk of sedation.      Relevant Medications   FLUoxetine (PROZAC) 20 MG capsule   B12 deficiency    Concerned b12 could be exacerbating neuropathy and realized patient may not be on b12 after patient left office. I have sent her a mychart to resume b12 whether IM or PO.       Low back pain    Chronic, uncontrolled.  Patient will call pain management to see if stable to schedule appointment.  We will continue Lyrica 150 mg 3 times daily.        I have discontinued Penelopi Mikrut. [place] "[name]"'s FLUoxetine. I am also having her start on FLUoxetine. Additionally, I am having her maintain her Multiple Vitamins-Minerals (MULTIVITAL PO), Lactobacillus (PROBIOTIC ACIDOPHILUS PO), traZODone, lidocaine-prilocaine, rosuvastatin,  nicotine, nicotine  polacrilex, cyanocobalamin, tiZANidine, Gemtesa, LORazepam, ondansetron, pregabalin, Oxycodone HCl, trimethoprim, and anastrozole.   Meds ordered this encounter  Medications  . FLUoxetine (PROZAC) 20 MG capsule    Sig: Take 1 capsule (20 mg total) by mouth every morning.    Dispense:  90 capsule    Refill:  3    Order Specific Question:   Supervising Provider    Answer:   Crecencio Mc [[id number]]    Return precautions given.   Risks, benefits, and alternatives of the medications and treatment plan prescribed today were discussed, and patient expressed understanding.   Education regarding symptom management and diagnosis given to patient on AVS.  Continue to follow with Burnard Hawthorne, FNP for routine health maintenance.   Alden Benjamin Espejo and I agreed with plan.   Mable Paris, FNP

## 2022-05-02 NOTE — Patient Instructions (Addendum)
Decrease prozac to 20mg   Once you start weaning off of oxycodone, you may start trazodone 25mg  Do not take trazodone with tizanzidine, ativan or alcohol as sedating.   Trial stop crestor for 2 weeks and see if joint pain improves.    Call Dr Tyna Jaksch office to schedule for pain clinic. Let me know once you are scheduled  [phone].

## 2022-05-02 NOTE — Assessment & Plan Note (Signed)
Chronic, uncontrolled.  Patient will call pain management to see if stable to schedule appointment.  We will continue Lyrica 150 mg 3 times daily.

## 2022-05-02 NOTE — Assessment & Plan Note (Addendum)
Poor control. She has upcoming consult with Dr Mickeal Skinner, will follow. Continue lyrica 150mg  tid. Restart b12 supplementation.

## 2022-05-02 NOTE — Assessment & Plan Note (Signed)
Concerned b12 could be exacerbating neuropathy and realized patient may not be on b12 after patient left office. I have sent her a mychart to resume b12 whether IM or PO.

## 2022-05-02 NOTE — Assessment & Plan Note (Signed)
In setting of arthralgia, advised to trial stop crestor 5mg  for 2 weeks to see if contributory. We discussed resuming once weekly with gradual increase after suspending medication for 2 weeks.

## 2022-05-02 NOTE — Assessment & Plan Note (Signed)
Chronic, stable. Concern for jerking sensation of hands ( not observed today) may be related to prozac. Decrease prozac to 20mg  and monitor for resolution. Advised she may start trazodone 25mg  to aid in sleep as she weans from oxycodone. Counseled on sedation medications including Lyrica, tizanidine prn, oxycodone, trazodone and the importance of not taking either these medications at the same time or together.  She understands risk of sedation.

## 2022-05-08 ENCOUNTER — Ambulatory Visit
Admission: RE | Admit: 2022-05-08 | Discharge: 2022-05-08 | Disposition: A | Discharge status: Home / Self Care | Payer: No Typology Code available for payment source | Source: Ambulatory Visit | Attending: Oncology | Admitting: Oncology

## 2022-05-08 ENCOUNTER — Telehealth: Payer: Self-pay | Admitting: *Deleted

## 2022-05-08 ENCOUNTER — Other Ambulatory Visit (HOSPITAL_COMMUNITY): Payer: Self-pay

## 2022-05-08 ENCOUNTER — Encounter: Payer: Self-pay | Admitting: Oncology

## 2022-05-08 DIAGNOSIS — Z170 Estrogen receptor positive status [ER+]: Secondary | ICD-10-CM | POA: Diagnosis present

## 2022-05-08 DIAGNOSIS — C50412 Malignant neoplasm of upper-outer quadrant of left female breast: Secondary | ICD-10-CM | POA: Insufficient documentation

## 2022-05-08 MED ORDER — GADOBUTROL 1 MMOL/ML IV SOLN
5.0000 mL | Freq: Once | INTRAVENOUS | Status: AC | PRN
  Administered 2022-05-08: 5 mL via INTRAVENOUS

## 2022-05-08 MED ORDER — ABEMACICLIB 150 MG PO TABS
150.0000 mg | ORAL_TABLET | Freq: Two times a day (BID) | ORAL | 0 refills | Status: DC
  Filled 2022-05-08 – 2022-05-09 (×2): qty 56, 28d supply, fill #0

## 2022-05-08 NOTE — Telephone Encounter (Signed)
Called report  IMPRESSION: 1. Punctate enhancing focus within the posteromedial right frontal lobe. 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 A short-interval 6-12 week follow-up brain MRI without and with contrast is recommended for surveillance. 2. Age advanced multifocal T2 FLAIR hyperintense signal abnormality within the cerebral white matter and pons, as described. Findings are nonspecific, but most often secondary to chronic small vessel ischemia.     Electronically Signed   By: Kellie Simmering D.O.   On: 05/08/2022 16:02

## 2022-05-09 ENCOUNTER — Telehealth: Payer: Self-pay | Admitting: Pharmacist

## 2022-05-09 ENCOUNTER — Other Ambulatory Visit (HOSPITAL_COMMUNITY): Payer: Self-pay

## 2022-05-09 ENCOUNTER — Other Ambulatory Visit: Payer: Self-pay | Admitting: Oncology

## 2022-05-09 ENCOUNTER — Encounter: Payer: Self-pay | Admitting: Oncology

## 2022-05-09 ENCOUNTER — Other Ambulatory Visit: Payer: Self-pay

## 2022-05-09 ENCOUNTER — Ambulatory Visit (HOSPITAL_BASED_OUTPATIENT_CLINIC_OR_DEPARTMENT_OTHER)
Admission: RE | Admit: 2022-05-09 | Discharge: 2022-05-09 | Disposition: A | Discharge status: Home / Self Care | Payer: No Typology Code available for payment source | Source: Ambulatory Visit | Attending: Oncology | Admitting: Oncology

## 2022-05-09 DIAGNOSIS — Z170 Estrogen receptor positive status [ER+]: Secondary | ICD-10-CM

## 2022-05-09 DIAGNOSIS — Z0189 Encounter for other specified special examinations: Secondary | ICD-10-CM

## 2022-05-09 DIAGNOSIS — C50412 Malignant neoplasm of upper-outer quadrant of left female breast: Secondary | ICD-10-CM

## 2022-05-09 LAB — ECHOCARDIOGRAM COMPLETE
AR max vel: 1.71 cm2
AV Area VTI: 1.79 cm2
AV Area mean vel: 1.71 cm2
AV Mean grad: 3 mmHg
AV Peak grad: 6.6 mmHg
Ao pk vel: 1.28 m/s
Area-P 1/2: 3.12 cm2
S' Lateral: 2.51 cm

## 2022-05-09 MED ORDER — ONDANSETRON HCL 8 MG PO TABS
8.0000 mg | ORAL_TABLET | Freq: Two times a day (BID) | ORAL | 2 refills | Status: DC
  Filled 2022-05-09: qty 60, 30d supply, fill #0

## 2022-05-09 MED ORDER — PROCHLORPERAZINE MALEATE 10 MG PO TABS
10.0000 mg | ORAL_TABLET | Freq: Four times a day (QID) | ORAL | 2 refills | Status: DC | PRN
  Filled 2022-05-09: qty 30, 8d supply, fill #0

## 2022-05-09 NOTE — Telephone Encounter (Signed)
Oral Chemotherapy Pharmacist Encounter  Successfully signed the patient up for a copay card for Verzenio.  ID: [id number] BIN: [id number] Group: [id number] PCN: [id number]  Billing information will be shared with [facility]. I will place a copy of the card to be scanned into patient's chart. Copy also mailed to patient for her records.

## 2022-05-09 NOTE — Telephone Encounter (Signed)
Spoke with patient earlier today in reference to something else. She asked me about her Zoladex when she was on the line. Dr. Janese Banks would like for her to get her Zoladex as scheduled this week, then after that they will be held. Patient stated her understanding of the plan.

## 2022-05-09 NOTE — Telephone Encounter (Signed)
Oral Chemotherapy Pharmacist Encounter  [place] [facility] will deliver Verzenio to patient by 05/11/22. She will start when she has medication in hand.  Patient Education I spoke with patient for overview of new oral chemotherapy medication: Verzenio (abemaciclib) for the adjuvant treatment of high risk breast cancer in conjunction with anastrazole, planned duration of 2 years.   Of note, patient has discordant breast mass and lymph nodes. Her breast mass is ER/PR positive and HER2 postivive and her lymph notes are ER/PR positive and HER2 negative.  Pt is doing well. Counseled patient on administration, dosing, side effects, monitoring, drug-food interactions, safe handling, storage, and disposal. Patient will take 1 tablet (150 mg total) by mouth 2 (two) times daily.  Side effects include but not limited to: diarrhea, nausea, fatigue, decreased wbc/hgb/plt. Nausea: prescriptions for anti-emetics sent to her local pharmacy Diarrhea: patient has loperamide to use at home if needed    Reviewed with patient importance of keeping a medication schedule and plan for any missed doses.  After discussion with patient no patient barriers to medication adherence identified.   Tricia Berry voiced understanding and appreciation. All questions answered. Medication handout provided.  Provided patient with Oral [place] [place] [facility] phone number. Patient knows to call the office with questions or concerns. Oral Chemotherapy Navigation Clinic will continue to follow.  Darl Pikes, PharmD, BCPS, BCOP, CPP Hematology/Oncology Clinical Pharmacist Practitioner [place]/DB/AP Oral [place] [facility] [phone]  05/09/2022 3:34 PM

## 2022-05-10 ENCOUNTER — Encounter: Payer: Self-pay | Admitting: Oncology

## 2022-05-10 ENCOUNTER — Other Ambulatory Visit (HOSPITAL_COMMUNITY): Payer: Self-pay

## 2022-05-12 ENCOUNTER — Inpatient Hospital Stay: Payer: No Typology Code available for payment source

## 2022-05-12 ENCOUNTER — Other Ambulatory Visit (HOSPITAL_COMMUNITY): Payer: Self-pay

## 2022-05-12 DIAGNOSIS — Z170 Estrogen receptor positive status [ER+]: Secondary | ICD-10-CM

## 2022-05-12 DIAGNOSIS — Z5112 Encounter for antineoplastic immunotherapy: Secondary | ICD-10-CM | POA: Diagnosis not present

## 2022-05-12 MED ORDER — GOSERELIN ACETATE 3.6 MG ~~LOC~~ IMPL
3.6000 mg | DRUG_IMPLANT | SUBCUTANEOUS | Status: DC
  Administered 2022-05-12: 3.6 mg via SUBCUTANEOUS
  Filled 2022-05-12: qty 3.6

## 2022-05-15 ENCOUNTER — Other Ambulatory Visit: Payer: Self-pay

## 2022-05-17 ENCOUNTER — Inpatient Hospital Stay: Payer: No Typology Code available for payment source

## 2022-05-17 ENCOUNTER — Other Ambulatory Visit: Payer: Self-pay

## 2022-05-17 ENCOUNTER — Other Ambulatory Visit: Payer: Self-pay | Admitting: Oncology

## 2022-05-17 ENCOUNTER — Telehealth: Payer: Self-pay | Admitting: Plastic Surgery

## 2022-05-17 VITALS — BP 107/62 | HR 61 | Resp 16 | Ht 60.0 in | Wt 109.2 lb

## 2022-05-17 DIAGNOSIS — Z5112 Encounter for antineoplastic immunotherapy: Secondary | ICD-10-CM | POA: Diagnosis not present

## 2022-05-17 DIAGNOSIS — Z170 Estrogen receptor positive status [ER+]: Secondary | ICD-10-CM

## 2022-05-17 MED ORDER — ACETAMINOPHEN 325 MG PO TABS
650.0000 mg | ORAL_TABLET | Freq: Once | ORAL | Status: AC
  Administered 2022-05-17: 650 mg via ORAL
  Filled 2022-05-17: qty 2

## 2022-05-17 MED ORDER — OXYCODONE HCL 10 MG PO TABS
10.0000 mg | ORAL_TABLET | ORAL | 0 refills | Status: DC
  Filled 2022-05-17 – ????-??-?? (×3): qty 30, 30d supply, fill #0

## 2022-05-17 MED ORDER — PERTUZ-TRASTUZ-HYALURON-ZZXF 60-60-2000 MG-MG-U/ML CHEMO ~~LOC~~ SOLN
10.0000 mL | Freq: Once | SUBCUTANEOUS | Status: AC
  Administered 2022-05-17: 10 mL via SUBCUTANEOUS
  Filled 2022-05-17: qty 10

## 2022-05-17 MED ORDER — DIPHENHYDRAMINE HCL 25 MG PO CAPS
50.0000 mg | ORAL_CAPSULE | Freq: Once | ORAL | Status: AC
  Administered 2022-05-17: 50 mg via ORAL
  Filled 2022-05-17: qty 2

## 2022-05-17 NOTE — Telephone Encounter (Signed)
Please let patient know this will be her last ocy prescription. Has she heard back from pain clinic?

## 2022-05-17 NOTE — Telephone Encounter (Signed)
Trlvm need to schedfule surgery by 08.31.23--- opening 08.02 morning

## 2022-05-17 NOTE — Patient Instructions (Signed)
[facility] CANCER CTR AT [place]  Discharge Instructions: Thank you for choosing [facility] to provide your oncology and hematology care.  If you have a lab appointment with the [facility], please go directly to the [facility] and check in at the registration area.  Wear comfortable clothing and clothing appropriate for easy access to any Portacath or PICC line.   We strive to give you quality time with your provider. You may need to reschedule your appointment if you arrive late (15 or more minutes).  Arriving late affects you and other patients whose appointments are after yours.  Also, if you miss three or more appointments without notifying the office, you may be dismissed from the clinic at the provider's discretion.      For prescription refill requests, have your pharmacy contact our office and allow 72 hours for refills to be completed.    Today you received the following chemotherapy and/or immunotherapy agents [place]      To help prevent nausea and vomiting after your treatment, we encourage you to take your nausea medication as directed.  BELOW ARE SYMPTOMS THAT SHOULD BE REPORTED IMMEDIATELY: *FEVER GREATER THAN 100.4 F (38 C) OR HIGHER *CHILLS OR SWEATING *NAUSEA AND VOMITING THAT IS NOT CONTROLLED WITH YOUR NAUSEA MEDICATION *UNUSUAL SHORTNESS OF BREATH *UNUSUAL BRUISING OR BLEEDING *URINARY PROBLEMS (pain or burning when urinating, or frequent urination) *BOWEL PROBLEMS (unusual diarrhea, constipation, pain near the anus) TENDERNESS IN MOUTH AND THROAT WITH OR WITHOUT PRESENCE OF ULCERS (sore throat, sores in mouth, or a toothache) UNUSUAL RASH, SWELLING OR PAIN  UNUSUAL VAGINAL DISCHARGE OR ITCHING   Items with * indicate a potential emergency and should be followed up as soon as possible or go to the Emergency Department if any problems should occur.  Please show the CHEMOTHERAPY ALERT CARD or IMMUNOTHERAPY ALERT CARD at check-in to the  Emergency Department and triage nurse.  Should you have questions after your visit or need to cancel or reschedule your appointment, please contact [facility] CANCER [address] AT [place]  [phone] and follow the prompts.  Office hours are 8:00 a.m. to 4:30 p.m. Monday - Friday. Please note that voicemails left after 4:00 p.m. may not be returned until the following business day.  We are closed weekends and major holidays. You have access to a nurse at all times for urgent questions. Please call the main number to the clinic [phone] and follow the prompts.  For any non-urgent questions, you may also contact your provider using MyChart. We now offer e-Visits for anyone 35 and older to request care online for non-urgent symptoms. For details visit [email or web address].   Also download the MyChart app! Go to the app store, search "MyChart", open the app, select [place], and log in with your MyChart username and password.  Masks are optional in the cancer centers. If you would like for your care team to wear a mask while they are taking care of you, please let them know. For doctor visits, patients may have with them one support person who is at least 52 years old. At this time, visitors are not allowed in the infusion area.

## 2022-05-19 ENCOUNTER — Other Ambulatory Visit: Payer: Self-pay

## 2022-05-22 ENCOUNTER — Other Ambulatory Visit: Payer: Self-pay

## 2022-05-22 ENCOUNTER — Telehealth: Payer: Self-pay | Admitting: *Deleted

## 2022-05-22 DIAGNOSIS — C50412 Malignant neoplasm of upper-outer quadrant of left female breast: Secondary | ICD-10-CM

## 2022-05-22 NOTE — Telephone Encounter (Signed)
Patients significant other called triage line to request visit. Patient with fever, nausea and vomiting that has worsened over the past few days. He reports she recently started Verzenio. Secure chat message to clinic team as well as [facility] team.

## 2022-05-22 NOTE — Telephone Encounter (Signed)
Spoke to pt's significant other, Randall Hiss. He is agreeable to bring pt to [facility] in the AM. Instructed to increase fluid intake and to go to ER if symptoms worsen overnight. Verbalized understanding. Appointments scheduled.

## 2022-05-23 ENCOUNTER — Other Ambulatory Visit: Payer: Self-pay

## 2022-05-23 ENCOUNTER — Encounter: Payer: Self-pay | Admitting: *Deleted

## 2022-05-23 ENCOUNTER — Inpatient Hospital Stay: Payer: No Typology Code available for payment source | Attending: Oncology

## 2022-05-23 ENCOUNTER — Inpatient Hospital Stay (HOSPITAL_BASED_OUTPATIENT_CLINIC_OR_DEPARTMENT_OTHER): Payer: No Typology Code available for payment source | Admitting: Hospice and Palliative Medicine

## 2022-05-23 ENCOUNTER — Encounter: Payer: Self-pay | Admitting: Hospice and Palliative Medicine

## 2022-05-23 ENCOUNTER — Other Ambulatory Visit (HOSPITAL_COMMUNITY): Payer: Self-pay

## 2022-05-23 ENCOUNTER — Inpatient Hospital Stay: Payer: No Typology Code available for payment source

## 2022-05-23 VITALS — BP 94/66 | HR 75 | Temp 98.0°F | Resp 18 | Ht 60.0 in | Wt 109.0 lb

## 2022-05-23 DIAGNOSIS — Z8041 Family history of malignant neoplasm of ovary: Secondary | ICD-10-CM | POA: Insufficient documentation

## 2022-05-23 DIAGNOSIS — C50412 Malignant neoplasm of upper-outer quadrant of left female breast: Secondary | ICD-10-CM

## 2022-05-23 DIAGNOSIS — Z170 Estrogen receptor positive status [ER+]: Secondary | ICD-10-CM | POA: Insufficient documentation

## 2022-05-23 DIAGNOSIS — Z5112 Encounter for antineoplastic immunotherapy: Secondary | ICD-10-CM | POA: Diagnosis present

## 2022-05-23 DIAGNOSIS — G629 Polyneuropathy, unspecified: Secondary | ICD-10-CM | POA: Diagnosis not present

## 2022-05-23 DIAGNOSIS — R197 Diarrhea, unspecified: Secondary | ICD-10-CM

## 2022-05-23 DIAGNOSIS — R112 Nausea with vomiting, unspecified: Secondary | ICD-10-CM | POA: Insufficient documentation

## 2022-05-23 DIAGNOSIS — Z9013 Acquired absence of bilateral breasts and nipples: Secondary | ICD-10-CM | POA: Insufficient documentation

## 2022-05-23 DIAGNOSIS — R110 Nausea: Secondary | ICD-10-CM

## 2022-05-23 DIAGNOSIS — R509 Fever, unspecified: Secondary | ICD-10-CM | POA: Diagnosis not present

## 2022-05-23 DIAGNOSIS — C7931 Secondary malignant neoplasm of brain: Secondary | ICD-10-CM | POA: Insufficient documentation

## 2022-05-23 DIAGNOSIS — Z9221 Personal history of antineoplastic chemotherapy: Secondary | ICD-10-CM | POA: Diagnosis not present

## 2022-05-23 DIAGNOSIS — E860 Dehydration: Secondary | ICD-10-CM

## 2022-05-23 DIAGNOSIS — F17210 Nicotine dependence, cigarettes, uncomplicated: Secondary | ICD-10-CM | POA: Diagnosis not present

## 2022-05-23 LAB — CBC WITH DIFFERENTIAL/PLATELET
Abs Immature Granulocytes: 0.05 10*3/uL (ref 0.00–0.07)
Basophils Absolute: 0 10*3/uL (ref 0.0–0.1)
Basophils Relative: 0 %
Eosinophils Absolute: 0 10*3/uL (ref 0.0–0.5)
Eosinophils Relative: 0 %
HCT: 35 % — ABNORMAL LOW (ref 36.0–46.0)
Hemoglobin: 11.6 g/dL — ABNORMAL LOW (ref 12.0–15.0)
Immature Granulocytes: 1 %
Lymphocytes Relative: 24 %
Lymphs Abs: 1.8 10*3/uL (ref 0.7–4.0)
MCH: 29.4 pg (ref 26.0–34.0)
MCHC: 33.1 g/dL (ref 30.0–36.0)
MCV: 88.8 fL (ref 80.0–100.0)
Monocytes Absolute: 0.2 10*3/uL (ref 0.1–1.0)
Monocytes Relative: 3 %
Neutro Abs: 5.2 10*3/uL (ref 1.7–7.7)
Neutrophils Relative %: 72 %
Platelets: 170 10*3/uL (ref 150–400)
RBC: 3.94 MIL/uL (ref 3.87–5.11)
RDW: 15.5 % (ref 11.5–15.5)
WBC: 7.3 10*3/uL (ref 4.0–10.5)
nRBC: 0 % (ref 0.0–0.2)

## 2022-05-23 LAB — COMPREHENSIVE METABOLIC PANEL
ALT: 22 U/L (ref 0–44)
AST: 21 U/L (ref 15–41)
Albumin: 3.5 g/dL (ref 3.5–5.0)
Alkaline Phosphatase: 74 U/L (ref 38–126)
Anion gap: 10 (ref 5–15)
BUN: 16 mg/dL (ref 6–20)
CO2: 25 mmol/L (ref 22–32)
Calcium: 8.6 mg/dL — ABNORMAL LOW (ref 8.9–10.3)
Chloride: 98 mmol/L (ref 98–111)
Creatinine, Ser: 1.04 mg/dL — ABNORMAL HIGH (ref 0.44–1.00)
GFR, Estimated: >60 mL/min (ref >60)
Glucose, Bld: 120 mg/dL — ABNORMAL HIGH (ref 70–99)
Potassium: 3.4 mmol/L — ABNORMAL LOW (ref 3.5–5.1)
Sodium: 133 mmol/L — ABNORMAL LOW (ref 135–145)
Total Bilirubin: 1.2 mg/dL (ref 0.3–1.2)
Total Protein: 7.4 g/dL (ref 6.5–8.1)

## 2022-05-23 LAB — MAGNESIUM: Magnesium: 1.8 mg/dL (ref 1.7–2.4)

## 2022-05-23 MED ORDER — ONDANSETRON HCL 4 MG/2ML IJ SOLN
8.0000 mg | Freq: Once | INTRAMUSCULAR | Status: AC
  Administered 2022-05-23: 8 mg via INTRAVENOUS
  Filled 2022-05-23: qty 4

## 2022-05-23 MED ORDER — SODIUM CHLORIDE 0.9 % IV SOLN
10.0000 mg | Freq: Once | INTRAVENOUS | Status: AC
  Administered 2022-05-23: 10 mg via INTRAVENOUS
  Filled 2022-05-23: qty 10

## 2022-05-23 MED ORDER — SODIUM CHLORIDE 0.9 % IV SOLN
INTRAVENOUS | Status: DC
  Filled 2022-05-23 (×2): qty 250

## 2022-05-23 MED ORDER — ONDANSETRON 8 MG PO TBDP
8.0000 mg | ORAL_TABLET | Freq: Three times a day (TID) | ORAL | 2 refills | Status: DC | PRN
  Filled 2022-05-23: qty 45, 15d supply, fill #0

## 2022-05-23 NOTE — Progress Notes (Signed)
Symptom Management and [place] at [facility] [phone] [phone]  Patient Care Team: Burnard Hawthorne, FNP as PCP - General (Family Medicine) Kate Sable, MD as PCP - Cardiology (Cardiology) Rico Junker, RN as Oncology Nurse Navigator Sindy Guadeloupe, MD as Consulting Physician (Hematology and Oncology)   NAME OF PATIENT: Tricia Berry  [id number]  1971/06/02   DATE OF VISIT: 05/23/22  REASON FOR CONSULT: Tricia Berry is a 51 y.o. 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  Patient is status post neoadjuvant AC Taxol chemotherapy and underwent bilateral mastectomy with reconstruction.  She was noted to have residual disease and so was treated with adjuvant Herceptin and Perjeta.   INTERVAL HISTORY: Patient last saw Dr. Janese Banks on 04/26/2022 at which time she continued to have chronic neuropathy managed with Lyrica.  She had arthralgias worse on the exemestane and so was rotated back to Arimidex.  Patient received cycle 15 Herceptin and Perjeta on 05/17/2022.  She is also recently started Verzenio.  Patient presented to clinic for evaluation of intermittent nausea, vomiting, and diarrhea since starting Verzenio.  However, patient reports that those symptoms are somewhat improved today.  She has been taking Zofran but sometimes cannot keep it down.  She has tried Imodium, which does help improve the diarrhea but at times causes constipation.  Additionally, patient reports 4 to 5 days of mild sore throat, watery/burning eyes, occasional cough/congestion, and occasional headaches.  She also says that she has had intermittent low-grade fevers over the same time period.  She took a home COVID test, which was negative.  Patient offers no further specific complaints today.  SOCIAL HISTORY:     reports that she has been smoking cigarettes. She has been  smoking an average of .25 packs per day. She has never used smokeless tobacco. She reports that she does not currently use alcohol. She reports that she does not use drugs.  Patient was an ICU nurse at [employer].  ADVANCE DIRECTIVES:  None on file  CODE STATUS:    PAST MEDICAL HISTORY: Past Medical History:  Diagnosis Date   Anxiety    Breast cancer ([place])    Diverticulitis    Family history of ovarian cancer    GERD (gastroesophageal reflux disease)    IBS (irritable bowel syndrome)    PONV (postoperative nausea and vomiting)    severe migraine and vomiting post anesthesia   Scoliosis     PAST SURGICAL HISTORY:  Past Surgical History:  Procedure Laterality Date   ABDOMINAL HYSTERECTOMY     still has ovaries, no gyn cancer, hysterectomy due to endometriosis. NO cervix on exam 01/10/21   BREAST BIOPSY Left 09/15/2020   [place] bx of mass, path pending, Q marker   BREAST BIOPSY Left 09/15/2020   [place] bx of LN, hydromarker, path pending   BREAST RECONSTRUCTION WITH PLACEMENT OF TISSUE EXPANDER AND FLEX HD (ACELLULAR HYDRATED DERMIS) Bilateral 03/17/2021   Procedure: IMMEDIATE BILATERAL BREAST RECONSTRUCTION WITH PLACEMENT OF TISSUE EXPANDER AND FLEX HD (ACELLULAR HYDRATED DERMIS);  Surgeon: Wallace Going, DO;  Location: [place];  Service: Plastics;  Laterality: Bilateral;   COLONOSCOPY WITH PROPOFOL N/A 11/21/2021   Procedure: COLONOSCOPY WITH PROPOFOL;  Surgeon: Lin Landsman, MD;  Location[facility] ENDOSCOPY;  Service: Gastroenterology;  Laterality: N/A;   IR IMAGING GUIDED PORT INSERTION  10/01/2020   MODIFIED MASTECTOMY Left 03/17/2021   Procedure: LEFT MODIFIED RADICAL MASTECTOMY;  Surgeon: Erroll Luna, MD;  Location: [place];  Service: General;  Laterality: Left;   PORTA CATH REMOVAL Right 03/17/2021   Procedure: PORTA CATH REMOVAL;  Surgeon: Erroll Luna, MD;  Location: [place];  Service: General;  Laterality: Right;    REMOVAL OF BILATERAL TISSUE EXPANDERS WITH PLACEMENT OF BILATERAL BREAST IMPLANTS Bilateral 05/09/2021   Procedure: REMOVAL OF BILATERAL TISSUE EXPANDERS WITH PLACEMENT OF BILATERAL BREAST IMPLANTS;  Surgeon: Wallace Going, DO;  Location: [place];  Service: Plastics;  Laterality: Bilateral;  90 min   TOTAL MASTECTOMY Right 03/17/2021   Procedure: TOTAL MASTECTOMY;  Surgeon: Erroll Luna, MD;  Location: [place];  Service: General;  Laterality: Right;    HEMATOLOGY/ONCOLOGY HISTORY:  Oncology History  Malignant neoplasm of upper-outer quadrant of left breast in female, estrogen receptor positive ([place])  09/26/2020 Initial Diagnosis   Malignant neoplasm of upper-outer quadrant of left breast in female, estrogen receptor positive ([place])   10/08/2020 - 02/18/2021 Chemotherapy         10/08/2020 Cancer Staging   Staging form: Breast, AJCC 8th Edition - Clinical stage from 10/08/2020: Stage IIIB (cT4, cN1, cM0, G2, ER+, PR+, HER2-) - Signed by Sindy Guadeloupe, MD on 10/08/2020    Genetic Testing   Negative genetic testing. No pathogenic variants identified on the [facility] Common Hereditary Cancers Panel. The report date is 11/18/2020.  The Multi-Cancer Panel offered by [facility] includes sequencing and/or deletion duplication testing of the following 84 genes: AIP, ALK, APC, ATM, AXIN2,BAP1,  BARD1, BLM, BMPR1A, BRCA1, BRCA2, BRIP1, CASR, CDC73, CDH1, CDK4, CDKN1B, CDKN1C, CDKN2A (p14ARF), CDKN2A (p16INK4a), CEBPA, CHEK2, CTNNA1, DICER1, DIS3L2, EGFR (c.2369C>T, p.Thr790Met variant only), EPCAM (Deletion/duplication testing only), FH, FLCN, GATA2, GPC3, GREM1 (Promoter region deletion/duplication testing only), HOXB13 (c.251G>A, p.Gly84Glu), HRAS, KIT, MAX, MEN1, MET, MITF (c.952G>A, p.Glu318Lys variant only), MLH1, MSH2, MSH3, MSH6, MUTYH, NBN, NF1, NF2, NTHL1, PALB2, PDGFRA, PHOX2B, PMS2, POLD1, POLE, POT1, PRKAR1A, PTCH1, PTEN, RAD50, RAD51C, RAD51D, RB1,  RECQL4, RET,  RUNX1, SDHAF2, SDHA (sequence changes only), SDHB, SDHC, SDHD, SMAD4, SMARCA4, SMARCB1, SMARCE1, STK11, SUFU, TERC, TERT, TMEM127, TP53, TSC1, TSC2, VHL, WRN and WT1.    04/11/2021 Cancer Staging   Staging form: Breast, AJCC 8th Edition - Pathologic stage from 04/11/2021: No Stage Recommended (ypT2, pN2a, cM0, G2, ER+, PR+, HER2+) - Signed by Sindy Guadeloupe, MD on 04/11/2021 Stage prefix: Post-therapy Histologic grading system: 3 grade system   04/27/2021 - 06/10/2021 Chemotherapy         07/01/2021 -  Chemotherapy   Patient is on Treatment Plan : BREAST Weekly Paclitaxel + Trastuzumab + Pertuzumab q21d x 8 cycles / Trastuzumab + Pertuzumab q21d x 4 cycles       ALLERGIES:  is allergic to morphine, sertraline hcl, sulfa antibiotics, sulfamethoxazole, and sulfonamide derivatives.  MEDICATIONS:  Current Outpatient Medications  Medication Sig Dispense Refill   abemaciclib (VERZENIO) 150 MG tablet Take 1 tablet (150 mg total) by mouth 2 (two) times daily. 56 tablet 0   anastrozole (ARIMIDEX) 1 MG tablet Take 1 tablet (1 mg total) by mouth daily. 30 tablet 3   CALCIUM-VITAMIN D PO Take 1 tablet by mouth daily at 12 noon.     cyanocobalamin (,VITAMIN B-12,) 1000 MCG/ML injection 1000 mcg (1 mL) intramuscular injection in the thigh ( vastus lateralis) once per month. 3 mL 4   FLUoxetine (PROZAC) 20 MG capsule Take 1 capsule (20 mg total) by mouth every morning. 90 capsule 3   Lactobacillus (  PROBIOTIC ACIDOPHILUS PO) Take 1 capsule by mouth daily.     LORazepam (ATIVAN) 0.5 MG tablet Take 1 tablet (0.5 mg total) by mouth at bedtime as needed for up to 30 doses for anxiety. 30 tablet 0   Multiple Vitamins-Minerals (MULTIVITAL PO) Take 1 Dose by mouth daily.     nicotine (NICODERM CQ - DOSED IN MG/24 HOURS) 21 mg/24hr patch use 1 patch daily 28 patch 0   ondansetron (ZOFRAN) 8 MG tablet Take 1 tablet (8 mg total) by mouth 2 (two) times daily. Take 30-60 mins prior to Verzenio to prevent  nausea. 60 tablet 2   pregabalin (LYRICA) 150 MG capsule Take 1 capsule (150 mg total) by mouth in the morning, at noon, and at bedtime. 90 capsule 2   prochlorperazine (COMPAZINE) 10 MG tablet Take 1 tablet (10 mg total) by mouth every 6 (six) hours as needed for nausea or vomiting. 30 tablet 2   trimethoprim (TRIMPEX) 100 MG tablet Take 1 tablet (100 mg total) by mouth daily. 90 tablet 3   VITAMIN D PO Take 2,000 Int'l Units/day by mouth daily at 12 noon.     lidocaine-prilocaine (EMLA) cream Apply 1 application topically as needed. (Patient not taking: Reported on 05/23/2022) 30 g 0   nicotine polacrilex (SM NICOTINE) 2 MG lozenge Take by mouth. (Patient not taking: Reported on 05/23/2022) 72 lozenge 0   Oxycodone HCl 10 MG TABS Take 1 tablet (10 mg total) by mouth as directed. (Patient not taking: Reported on 05/23/2022) 30 tablet 0   rosuvastatin (CRESTOR) 5 MG tablet Take 1 tablet (5 mg total) by mouth every evening. (Patient not taking: Reported on 05/23/2022) 90 tablet 3   tiZANidine (ZANAFLEX) 4 MG tablet Take 1 tablet (4 mg total) by mouth 3 (three) times daily. (Patient not taking: Reported on 05/23/2022) 30 tablet 0   traZODone (DESYREL) 50 MG tablet Take 0.5-1 tablets (25-50 mg total) by mouth at bedtime as needed for sleep. (Patient not taking: Reported on 04/26/2022) 30 tablet 3   No current facility-administered medications for this visit.   Facility-Administered Medications Ordered in Other Visits  Medication Dose Route Frequency Provider Last Rate Last Admin   goserelin (ZOLADEX) injection 3.6 mg  3.6 mg Subcutaneous Q28 days Sindy Guadeloupe, MD   3.6 mg at 05/04/21 1150   heparin lock flush 100 unit/mL  500 Units Intravenous Once Sindy Guadeloupe, MD        VITAL SIGNS: BP 94/66   Pulse 75   Temp 98 F (36.7 C) (Oral)   Resp 18   Ht 5' (1.524 m)   Wt 109 lb (49.4 kg)   SpO2 98% Comment: room air  BMI 21.29 kg/m  Filed Weights   05/23/22 0910  Weight: 109 lb (49.4 kg)     Estimated body mass index is 21.29 kg/m as calculated from the following:   Height as of this encounter: 5' (1.524 m).   Weight as of this encounter: 109 lb (49.4 kg).  LABS: CBC:    Component Value Date/Time   WBC 7.3 05/23/2022 0838   HGB 11.6 (L) 05/23/2022 0838   HGB 14.3 08/23/2020 1033   HCT 35.0 (L) 05/23/2022 0838   HCT 42.9 08/23/2020 1033   PLT 170 05/23/2022 0838   PLT 222 08/23/2020 1033   MCV 88.8 05/23/2022 0838   MCV 91 08/23/2020 1033   NEUTROABS 5.2 05/23/2022 0838   NEUTROABS 2.7 08/23/2020 1033   LYMPHSABS 1.8 05/23/2022 7035  LYMPHSABS 4.2 (H) 08/23/2020 1033   MONOABS 0.2 05/23/2022 0838   EOSABS 0.0 05/23/2022 0838   EOSABS 0.1 08/23/2020 1033   BASOSABS 0.0 05/23/2022 0838   BASOSABS 0.1 08/23/2020 1033   Comprehensive Metabolic Panel:    Component Value Date/Time   NA 133 (L) 05/23/2022 0838   K 3.4 (L) 05/23/2022 0838   CL 98 05/23/2022 0838   CO2 25 05/23/2022 0838   BUN 16 05/23/2022 0838   CREATININE 1.04 (H) 05/23/2022 0838   GLUCOSE 120 (H) 05/23/2022 0838   CALCIUM 8.6 (L) 05/23/2022 0838   AST 21 05/23/2022 0838   ALT 22 05/23/2022 0838   ALKPHOS 74 05/23/2022 0838   BILITOT 1.2 05/23/2022 0838   PROT 7.4 05/23/2022 0838   ALBUMIN 3.5 05/23/2022 0838    RADIOGRAPHIC STUDIES: ECHOCARDIOGRAM COMPLETE  Result Date: 05/09/2022    ECHOCARDIOGRAM REPORT   Patient Name:   ADIANA SMELCER Date of Exam: 05/09/2022 Medical Rec #:  [id number]           Height:       60.0 in Accession #:    [id number]          Weight:       112.0 lb Date of Birth:  1971-01-26           BSA:          1.459 m Patient Age:    61 years            BP:           128/78 mmHg Patient Gender: F                   HR:           57 bpm. Exam Location:  [facility] Procedure: 2D Echo, Cardiac Doppler, Color Doppler and Strain Analysis Indications:     Malignant neoplasm upper outer quadrant left breast, estrogen                  receptor positive  History:         Patient has  prior history of Echocardiogram examinations, most                  recent 01/30/2022. Breast cancer; Risk Factors:Current Smoker.  Sonographer:     Rosalia Hammers Referring Phys:  [id number] Weston Anna RAO Diagnosing Phys: Nelva Bush MD  Sonographer Comments: Suboptimal parasternal window. Image acquisition challenging due to respiratory motion, Image acquisition challenging due to mastectomy and Image acquisition challenging due to breast implants. Global longitudinal strain was attempted. IMPRESSIONS  1. Left ventricular ejection fraction, by estimation, is 60 to 65%. The left ventricle has normal function. Left ventricular endocardial border not optimally defined to evaluate regional wall motion. Left ventricular diastolic parameters were normal.  2. Right ventricular systolic function is normal. The right ventricular size is normal.  3. The mitral valve is normal in structure. Trivial mitral valve regurgitation.  4. The aortic valve is tricuspid. Aortic valve regurgitation is not visualized. No aortic stenosis is present.  5. The inferior vena cava is normal in size with greater than 50% respiratory variability, suggesting right atrial pressure of 3 mmHg. FINDINGS  Left Ventricle: Left ventricular ejection fraction, by estimation, is 60 to 65%. The left ventricle has normal function. Left ventricular endocardial border not optimally defined to evaluate regional wall motion. Global longitudinal strain performed but  not reported based on interpreter judgement due to suboptimal tracking. The left  ventricular internal cavity size was normal in size. There is borderline left ventricular hypertrophy. Left ventricular diastolic parameters were normal. Right Ventricle: The right ventricular size is normal. No increase in right ventricular wall thickness. Right ventricular systolic function is normal. Left Atrium: Left atrial size was normal in size. Right Atrium: Right atrial size was normal in size. Pericardium: There  is no evidence of pericardial effusion. Mitral Valve: The mitral valve is normal in structure. Trivial mitral valve regurgitation. Tricuspid Valve: The tricuspid valve is normal in structure. Tricuspid valve regurgitation is not demonstrated. Aortic Valve: The aortic valve is tricuspid. Aortic valve regurgitation is not visualized. No aortic stenosis is present. Aortic valve mean gradient measures 3.0 mmHg. Aortic valve peak gradient measures 6.6 mmHg. Aortic valve area, by VTI measures 1.79 cm. Pulmonic Valve: The pulmonic valve was normal in structure. Pulmonic valve regurgitation is trivial. No evidence of pulmonic stenosis. Aorta: The aortic root and ascending aorta are structurally normal, with no evidence of dilitation. Pulmonary Artery: The pulmonary artery is of normal size. Venous: The inferior vena cava is normal in size with greater than 50% respiratory variability, suggesting right atrial pressure of 3 mmHg. IAS/Shunts: No atrial level shunt detected by color flow Doppler.  LEFT VENTRICLE PLAX 2D LVIDd:         3.92 cm   Diastology LVIDs:         2.51 cm   LV e' medial:    12.70 cm/s LV PW:         1.00 cm   LV E/e' medial:  6.5 LV IVS:        1.00 cm   LV e' lateral:   12.50 cm/s LVOT diam:     1.70 cm   LV E/e' lateral: 6.6 LV SV:         47 LV SV Index:   32 LVOT Area:     2.27 cm                           3D Volume EF:                          3D EF:        65 %                          LV EDV:       100 ml                          LV ESV:       35 ml                          LV SV:        64 ml RIGHT VENTRICLE RV Basal diam:  3.14 cm RV S prime:     10.90 cm/s TAPSE (M-mode): 2.1 cm LEFT ATRIUM             Index        RIGHT ATRIUM           Index LA diam:        3.10 cm 2.12 cm/m   RA Area:     13.30 cm LA Vol (A2C):   39.0 ml 26.73 ml/m  RA Volume:   28.80 ml  19.74 ml/m LA Vol (  A4C):   24.1 ml 16.52 ml/m LA Biplane Vol: 31.4 ml 21.52 ml/m  AORTIC VALVE AV Area (Vmax):    1.71 cm AV Area  (Vmean):   1.71 cm AV Area (VTI):     1.79 cm AV Vmax:           128.00 cm/s AV Vmean:          84.000 cm/s AV VTI:            0.264 m AV Peak Grad:      6.6 mmHg AV Mean Grad:      3.0 mmHg LVOT Vmax:         96.60 cm/s LVOT Vmean:        63.200 cm/s LVOT VTI:          0.208 m LVOT/AV VTI ratio: 0.79  AORTA Ao Root diam: 2.60 cm MITRAL VALVE MV Area (PHT): 3.12 cm    SHUNTS MV Decel Time: 243 msec    Systemic VTI:  0.21 m MV E velocity: 83.10 cm/s  Systemic Diam: 1.70 cm MV A velocity: 57.80 cm/s MV E/A ratio:  1.44 Harrell Gave End MD Electronically signed by Nelva Bush MD Signature Date/Time: 05/09/2022/11:43:29 AM    Final    MR Brain W Wo Contrast  Result Date: 05/08/2022 CLINICAL DATA:  Provided history: Malignant neoplasm of upper-outer quadrant of left breast in female, estrogen receptor positive. Breast cancer. EXAM: MRI HEAD WITHOUT AND WITH CONTRAST TECHNIQUE: Multiplanar, multiecho pulse sequences of the brain and surrounding structures were obtained without and with intravenous contrast. CONTRAST:  41m GADAVIST GADOBUTROL 1 MMOL/ML IV SOLN COMPARISON:  No pertinent prior exams available for comparison. FINDINGS: Brain: No age advanced or lobar predominant parenchymal atrophy. Punctate focus of enhancement within the posteromedial right frontal lobe (series 1027, image 73) (series 13, image 95). Tiny curvilinear focus of enhancement along the high right parietal lobe, favored vascular (series 1027, images 63 and 64). Moderate multifocal T2 FLAIR hyperintense signal abnormality within the cerebral white matter. Advanced T2 FLAIR hyperintense signal abnormality within the pons. There is no acute infarct. No chronic intracranial blood products. No extra-axial fluid collection. No midline shift. Vascular: Maintained flow voids within the proximal large arterial vessels. Skull and upper cervical spine: No focal suspicious marrow lesion. Sinuses/Orbits: No mass or acute finding within the imaged  orbits. No significant paranasal sinus disease. Other: Trace fluid within the bilateral mastoid air cells. Impression #1 will be called to the ordering clinician or representative by the Radiologist Assistant, and communication documented in the PACS or [employer] IMPRESSION: 1. Punctate enhancing focus within the posteromedial right frontal lobe. 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 A short-interval 6-12 week follow-up brain MRI without and with contrast is recommended for surveillance. 2. Age advanced multifocal T2 FLAIR hyperintense signal abnormality within the cerebral white matter and pons, as described. Findings are nonspecific, but most often secondary to chronic small vessel ischemia. Electronically Signed   By: KKellie SimmeringD.O.   On: 05/08/2022 16:02    PERFORMANCE STATUS (ECOG) : 1 - Symptomatic but completely ambulatory  Review of Systems Unless otherwise noted, a complete review of systems is negative.  Physical Exam General: NAD HEENT: No exudate, no lymphadenopathy Cardiovascular: regular rate and rhythm Pulmonary: clear ant fields Abdomen: soft, nontender, + bowel sounds GU: no suprapubic tenderness Extremities: no edema, no joint deformities Skin: no rashes Neurological: Weakness but otherwise nonfocal  IMPRESSION: I suspect the patient's nausea, vomiting, and diarrhea is  secondary to treatment as is suggested by the timing of symptoms starting when patient began Verzenio.  Labs suggestive of mild dehydration but otherwise unchanged from baseline. We will proceed with IV fluids today.  We will send new prescription for ondansetron ODT.  These symptoms are improved today but we will plan to continue supportive measures as needed.  Patient also appears to have unrelated new upper respiratory symptoms with negative home COVID test.  We will send for COVID/flu PCR but will plan to treat symptomatically unless PCR positive.  Patient will notify us  if positive PCR.  PLAN: -IV fluids/antiemetics today -Continue home antiemetics/antidiarrheals as needed -Ondansetron ODT and may increase to 3 times daily dosing as needed -Recommend COVID/flu PCR -RTC next week for labs/fluids   Patient expressed understanding and was in agreement with this plan. She also understands that She can call clinic at any time with any questions, concerns, or complaints.   Thank you for allowing me to participate in the care of this very pleasant patient.   Time Total: 15 minutes  Visit consisted of counseling and education dealing with the complex and emotionally intense issues of symptom management in the setting of serious illness.Greater than 50%  of this time was spent counseling and coordinating care related to the above assessment and plan.  Signed by: Altha Harm, PhD, NP-C

## 2022-05-23 NOTE — Progress Notes (Signed)
Patient requested to move her zometa infusion to 8/16 as she does not feel she can tolerate it tomorrow given her symptoms this weekend.

## 2022-05-23 NOTE — Patient Instructions (Addendum)
[facility] for covid 19 testing/pcr/flu testing [place], [place],  [id number]  ~14.7 mi

## 2022-05-24 ENCOUNTER — Inpatient Hospital Stay: Payer: No Typology Code available for payment source

## 2022-05-24 ENCOUNTER — Telehealth: Payer: Self-pay | Admitting: *Deleted

## 2022-05-24 NOTE — Telephone Encounter (Signed)
Contacted patient s/p [facility] infusion apt to follow-up with patient. Pt states that she "did feel a little better after iv fluids yesterday." She stated that her iv site from yesterday is a little sore. No redness at iv site, but the area is slightly swollen. Pt will apply cold/warm compresses 15 mins on and off. She will do the cold compresses as directed. Pt does not feel that she needs any additional apts this week in the cancer center. She was instructed to keep apts as scheduled next week in cancer center.

## 2022-05-25 ENCOUNTER — Telehealth: Payer: Self-pay | Admitting: Surgical

## 2022-05-25 NOTE — Telephone Encounter (Signed)
Called patient to discuss her current chemotherapy of Herceptin and Perjeta, she did not answer.  I was planning to discuss with her that the recommendation is to hold off on any surgical intervention due to increased risk of complications related to wound healing and increased risk of infection related to dual therapy.  I have previously notified nursing staff in the office that if patient calls the reason for my call.

## 2022-05-28 DIAGNOSIS — Z79899 Other long term (current) drug therapy: Secondary | ICD-10-CM | POA: Insufficient documentation

## 2022-05-28 DIAGNOSIS — M899 Disorder of bone, unspecified: Secondary | ICD-10-CM | POA: Insufficient documentation

## 2022-05-28 DIAGNOSIS — Z789 Other specified health status: Secondary | ICD-10-CM | POA: Insufficient documentation

## 2022-05-28 DIAGNOSIS — G894 Chronic pain syndrome: Secondary | ICD-10-CM | POA: Insufficient documentation

## 2022-05-28 NOTE — Progress Notes (Signed)
Patient: Tricia Berry  Service Category: E/M  Provider: Gaspar Cola, MD  DOB: 1971/09/13  DOS: 05/29/2022  Referring Provider: Burnard Hawthorne, FNP  MRN: [id number]  Setting: Ambulatory outpatient  PCP: Burnard Hawthorne, FNP  Type: New Patient  Specialty: Interventional Pain Management    Location: Office  Delivery: Face-to-face     Primary Reason(s) for Visit: Encounter for initial evaluation of one or more chronic problems (new to examiner) potentially causing chronic pain, and posing a threat to normal musculoskeletal function. (Level of risk: High) CC: Back Pain (low), Neck Pain, Shoulder Pain (left), and Arm Pain (arm)  HPI  [name] is a 51 y.o. year old, female patient, who comes for the first time to our practice referred by Burnard Hawthorne, FNP for our initial evaluation of her chronic pain. She has Anxiety state; TOBACCO ABUSE; INSOMNIA, CHRONIC; DEPRESSION, RECURRENT; INTERNAL HEMORRHOIDS; ALLERGIC RHINITIS, SEASONAL; IBS; Anal fissure; OSTEOARTHRITIS, HANDS, BILATERAL; Arthralgia; BACK STRAIN, LUMBAR; Encounter for medical examination to establish care; Malignant neoplasm of upper-outer quadrant of left breast in female, estrogen receptor positive ([place]); Chronic low back pain (1ry area of Pain) (Bilateral) (R>L) w/o sciatica; Candidal vulvovaginitis; Family history of ovarian cancer; Genetic testing; 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[place]); Atherosclerosis of aorta ([place]); Hepatic steatosis; B12 deficiency; Acquired absence of breast; Family history of heart disease; Chronic pain syndrome; Pharmacologic therapy; Disorder of skeletal system; Problems influencing health status; Pain medication agreement signed (12/07/21); History of breast cancer (Left); History of mastectomy (Bilateral); Chronic calf pain (2ry area of Pain) (Bilateral); Chronic lower extremity pain (3ry area of Pain) (Bilateral); Chronic feet pain (Bilateral); Chronic upper extremity pain (4th area of Pain)  (Bilateral) (L>R); Chronic hand pain (Bilateral) (L>R); Chronic generalized pain (5th area of Pain); and Abnormal MRI, lumbar spine (10/13/2021) on their problem list. Today she comes in for evaluation of her Back Pain (low), Neck Pain, Shoulder Pain (left), and Arm Pain (arm)  Pain Assessment: Location: Lower Back Radiating: occaionally will radiate into right hip and leg Onset: More than a month ago Duration: Chronic pain Quality: Aching, Sharp, Stabbing, Shooting Severity: 3 /10 (subjective, self-reported pain score)  Effect on ADL: Limits activities Timing: Constant Modifying factors: rest BP: 124/76  HR: 67  Onset and Duration: Sudden Cause of pain: Unknown Severity: No change since onset, NAS-11 at its worse: 8/10, NAS-11 at its best: 3/10, NAS-11 now: 5/10, and NAS-11 on the average: 5/10 Timing: Not influenced by the time of the day, During activity or exercise, After activity or exercise, and After a period of immobility Aggravating Factors: Bending, Lifiting, Prolonged sitting, Prolonged standing, Twisting, and Walking Alleviating Factors: Medications Associated Problems: Fatigue, Inability to concentrate, Nausea, Numbness, Temperature changes, Tingling, Weakness, Pain that wakes patient up, and Pain that does not allow patient to sleep Quality of Pain: Burning, Constant, Disabling, Dull, Exhausting, Fearful, Pulsating, Sharp, Shooting, and Tingling Previous Examinations or Tests: CT scan, MRI scan, and Orthopedic evaluation Previous Treatments: Epidural steroid injections, Narcotic medications, Physical Therapy, Steroid treatments by mouth, and Strengthening exercises  The patient indicates having 3 different referrals to our practice: According to the patient she was referred by [facility] for possible spinal cord stimulator trial and implant.  The second referral was from the primary care physician for evaluation of her chronic pain and neuropathy.  And according to  the patient the third was from her oncologist for [place] to take over the long-term management of her opioids.  According to the patient the primary area  of pain is that of the lower back (Bilateral) (R>L).  The patient denies any prior back surgeries but she admits to having had some nerve blocks done by [employer], DO.  In addition she had physical therapy at [facility] (15 visits) in 2023, which did not help.  The patient does have an MRI of the lumbar spine done on 10/13/2021 as well as a more recent MRI of the brain.  According to the patient all of this started after her treatment for left breast cancer.  She indicated having had daily radiation therapy for approximately 5 weeks, as well as chemotherapy.  She also indicates that she is still undergoing chemotherapy, every 3 weeks.  She had a bilateral mastectomy in 2022 and after that she has continued to have some left-sided chest pain and left arm pain with some "cording" of the left upper extremity after therapy.  Interventional therapies completed by Sharlet Salina, DO:   Therapeutic bilateral L3, L4, and L5 medial branch RFA x1 (02/16/2022) by Sharlet Salina, DO  Diagnostic/therapeutic bilateral L3, L4, and L5 MBB x2 (12/30/2021; 01/17/2022) by Sharlet Salina, DO  Diagnostic/therapeutic right L5-S1 TESI + right S1 TESI x1 (11/29/2021) by Sharlet Salina, DO   The patient denies having had any significant benefit from the injections done by Dr. Sharlet Salina.  She could not remember the results of the transforaminal epidural steroid injections and she indicated that when she had the diagnostic lumbar facet blocks the first 1 did not help at all and the second 1 helped only for 1 to 2 hours.  According to the procedure notes from Dr. Sharlet Salina, he did bilateral L3, L4, and L5 medial branch blocks.  This levels would effectively block the L4-5 facet joint and partially blocked L5-S1 facet joint.  The MRI of the lumbar spine done on  10/13/2021 indicated that the patient had a small right facet joint effusion at the L3-4 level.  The patient's secondary area pain is that of her calfs (Bilateral) (R=L).  The patient denies any surgeries in that area, she also denies any x-rays or ultrasounds of the lower extremities and she denies having had any nerve conduction test.  She is scheduled to see a neurologist at the oncology department (Dr. Mickeal Skinner) for evaluation on some findings on the MRI of her brain done on 05/08/2022 which indicated the patient to be having some nonspecific within the posteromedial right frontal lobe that could reflect cortical metastases.  Apparently the patient has an appointment to be evaluated by Dr. Mickeal Skinner (neurologist) on 06/02/2022 (this upcoming Friday).  The patient's third area pain is out of the lower extremities (Bilateral) (primarily her feet).  The patient indicates that she has been told that this is a chemotherapy-induced neuropathy.  She describes the pain as going to the top and bottom of the foot, specifically affecting the big toe and what could represent an L5/S1 dermatomal distribution.  She describes that the distribution of the pain is identical on both feet.  She also describes that in the case of the right foot the toe is very sensitive to touch, especially when she is trying to sleep at night.  She describes the pain as a burning, shocking, electrical-like pain.  The patient's fourth area pain is that of the upper extremities (Bilateral) (L>R) (primarily the hands).  She describes the problem as being primarily numbness, tingling, and occasionally having shooting pains to all of the fingers on the left hand and the index finger and middle finger  on the right hand.  The patient's fifth area pain is described to be that of generalized muscle and bone pain which she attributes to the medications that she is taking for the cancer.  Pharmacotherapy: The patient initially indicated taking oxycodone  IR 10 mg twice daily, but according to the patient's last PMP the prescriptions that she has been receiving are for 10 mg p.o. daily.  Today I took the time to provide the patient with information regarding my pain practice. The patient was informed that my practice is divided into two sections: an interventional pain management section, as well as a completely separate and distinct medication management section. I explained that I have procedure days for my interventional therapies, and evaluation days for follow-ups and medication management. Because of the amount of documentation required during both, they are kept separated. This means that there is the possibility that she may be scheduled for a procedure on one day, and medication management the next. I have also informed her that because of staffing and facility limitations, I no longer take patients for medication management only. To illustrate the reasons for this, I gave the patient the example of surgeons, and how inappropriate it would be to refer a patient to his/her care, just to write for the post-surgical antibiotics on a surgery done by a different surgeon.   Because interventional pain management is my board-certified specialty, the patient was informed that joining my practice means that they are open to any and all interventional therapies. I made it clear that this does not mean that they will be forced to have any procedures done. What this means is that I believe interventional therapies to be essential part of the diagnosis and proper management of chronic pain conditions. Therefore, patients not interested in these interventional alternatives will be better served under the care of a different practitioner.  The patient was also made aware of my Comprehensive Pain Management Safety Guidelines where by joining my practice, they limit all of their nerve blocks and joint injections to those done by our practice, for as long as we are  retained to manage their care.   Historic Controlled Substance Pharmacotherapy Review  PMP and historical list of controlled substances: Oxycodone IR 10 mg tablet, 1 tab p.o. daily (# 30) (last filled on 05/19/2022); pregabalin 150 mg capsule, 1 cap p.o. 3 times daily (# 90) (last filled 05/02/2022); lorazepam 0.5 mg tablet, 1 tab p.o. daily (# 30) (12/28/2021) Current opioid analgesics:   Oxycodone IR 10 mg tablet, 1 tab p.o. daily (# 30) (last filled on 05/19/2022) MME/day: 15 mg/day  Historical Monitoring: The patient  reports no history of drug use. List of prior UDS Testing: No results found for: "MDMA", "COCAINSCRNUR", "PCPSCRNUR", "PCPQUANT", "CANNABQUANT", "THCU", "ETH", "CBDTHCR", "D8THCCBX", "D9THCCBX" Historical Background Evaluation: [place] PMP: PDMP reviewed during this encounter. Review of the past 28-month conducted.             PMP NARX Score Report:  Narcotic: 591 Sedative: 561 Stimulant: 000 [place] Department of public safety, offender search: (Editor, commissioningInformation) Non-contributory Risk Assessment Profile: Aberrant behavior: None observed or detected today Risk factors for fatal opioid overdose: None identified today PMP NARX Overdose Risk Score: 320 Fatal overdose hazard ratio (HR): Calculation deferred Non-fatal overdose hazard ratio (HR): Calculation deferred Risk of opioid abuse or dependence: 0.7-3.0% with doses ? 36 MME/day and 6.1-26% with doses ? 120 MME/day. Substance use disorder (SUD) risk level: See below Personal History of Substance Abuse (SUD-Substance use disorder):  Alcohol: Negative  Illegal Drugs: Negative  Rx Drugs: Negative  ORT Risk Level calculation: Low Risk  Opioid Risk Tool - 05/29/22 1125       Family History of Substance Abuse   Alcohol Negative    Illegal Drugs Negative    Rx Drugs Negative      Personal History of Substance Abuse   Alcohol Negative    Illegal Drugs Negative    Rx Drugs Negative      Age   Age between 96-45 years  No       History of Preadolescent Sexual Abuse   History of Preadolescent Sexual Abuse Negative or Female      Psychological Disease   Psychological Disease Positive      Total Score   Opioid Risk Tool Scoring 2    Opioid Risk Interpretation Low Risk            ORT Scoring interpretation table:  Score <3 = Low Risk for SUD  Score between 4-7 = Moderate Risk for SUD  Score >8 = High Risk for Opioid Abuse   PHQ-2 Depression Scale:  Total score: 0  PHQ-2 Scoring interpretation table: (Score and probability of major depressive disorder)  Score 0 = No depression  Score 1 = 15.4% Probability  Score 2 = 21.1% Probability  Score 3 = 38.4% Probability  Score 4 = 45.5% Probability  Score 5 = 56.4% Probability  Score 6 = 78.6% Probability   PHQ-9 Depression Scale:  Total score: 0  PHQ-9 Scoring interpretation table:  Score 0-4 = No depression  Score 5-9 = Mild depression  Score 10-14 = Moderate depression  Score 15-19 = Moderately severe depression  Score 20-27 = Severe depression (2.4 times higher risk of SUD and 2.89 times higher risk of overuse)   Pharmacologic Plan: As per protocol, I have not taken over any controlled substance management, pending the results of ordered tests and/or consults.            Initial impression: Pending review of available data and ordered tests.  Meds   Current Outpatient Medications:    abemaciclib (VERZENIO) 150 MG tablet, Take 1 tablet (150 mg total) by mouth 2 (two) times daily., Disp: 56 tablet, Rfl: 0   anastrozole (ARIMIDEX) 1 MG tablet, Take 1 tablet (1 mg total) by mouth daily., Disp: 30 tablet, Rfl: 3   CALCIUM-VITAMIN D PO, Take 1 tablet by mouth daily at 12 noon., Disp: , Rfl:    cyanocobalamin (,VITAMIN B-12,) 1000 MCG/ML injection, 1000 mcg (1 mL) intramuscular injection in the thigh ( vastus lateralis) once per month., Disp: 3 mL, Rfl: 4   FLUoxetine (PROZAC) 20 MG capsule, Take 1 capsule (20 mg total) by mouth every morning., Disp: 90  capsule, Rfl: 3   Lactobacillus (PROBIOTIC ACIDOPHILUS PO), Take 1 capsule by mouth daily., Disp: , Rfl:    lidocaine-prilocaine (EMLA) cream, Apply 1 application topically as needed., Disp: 30 g, Rfl: 0   LORazepam (ATIVAN) 0.5 MG tablet, Take 1 tablet (0.5 mg total) by mouth at bedtime as needed for up to 30 doses for anxiety., Disp: 30 tablet, Rfl: 0   Multiple Vitamins-Minerals (MULTIVITAL PO), Take 1 Dose by mouth daily., Disp: , Rfl:    nicotine (NICODERM CQ - DOSED IN MG/24 HOURS) 21 mg/24hr patch, use 1 patch daily, Disp: 28 patch, Rfl: 0   ondansetron (ZOFRAN-ODT) 8 MG disintegrating tablet, Take 1 tablet (8 mg total) by mouth every 8 (eight) hours as needed for nausea  or vomiting., Disp: 45 tablet, Rfl: 2   Oxycodone HCl 10 MG TABS, Take 1 tablet (10 mg total) by mouth as directed., Disp: 30 tablet, Rfl: 0   pregabalin (LYRICA) 150 MG capsule, Take 1 capsule (150 mg total) by mouth in the morning, at noon, and at bedtime., Disp: 90 capsule, Rfl: 2   prochlorperazine (COMPAZINE) 10 MG tablet, Take 1 tablet (10 mg total) by mouth every 6 (six) hours as needed for nausea or vomiting., Disp: 30 tablet, Rfl: 2   rosuvastatin (CRESTOR) 5 MG tablet, Take 1 tablet (5 mg total) by mouth every evening., Disp: 90 tablet, Rfl: 3   tiZANidine (ZANAFLEX) 4 MG tablet, Take 1 tablet (4 mg total) by mouth 3 (three) times daily., Disp: 30 tablet, Rfl: 0   trimethoprim (TRIMPEX) 100 MG tablet, Take 1 tablet (100 mg total) by mouth daily., Disp: 90 tablet, Rfl: 3   VITAMIN D PO, Take 2,000 Int'l Units/day by mouth daily at 12 noon., Disp: , Rfl:  No current facility-administered medications for this visit.  Facility-Administered Medications Ordered in Other Visits:    goserelin (ZOLADEX) injection 3.6 mg, 3.6 mg, Subcutaneous, Q28 days, Sindy Guadeloupe, MD, 3.6 mg at 05/04/21 1150   heparin lock flush 100 unit/mL, 500 Units, Intravenous, Once, Sindy Guadeloupe, MD  Imaging Review  Lumbosacral  Imaging: Lumbar MR wo contrast: Results for orders placed during the hospital encounter of 10/13/21 MR LUMBAR SPINE WO CONTRAST  Narrative CLINICAL DATA:  Low back pain and right greater than left buttock pain, worsening over the last few months  EXAM: MRI LUMBAR SPINE WITHOUT CONTRAST  TECHNIQUE: Multiplanar, multisequence MR imaging of the lumbar spine was performed. No intravenous contrast was administered.  COMPARISON:  CT scan 07/18/2021  FINDINGS: Segmentation: The lowest lumbar type non-rib-bearing vertebra is labeled as L5.  Alignment: Levoconvex lumbar scoliosis with substantial rotary component. No subluxation.  Vertebrae: Mild type 1 degenerative endplate findings along the inferior endplate of L2 eccentric to the right. Disc desiccation at L3-4 and L5-S1 with mild loss of disc height at L5-S1. Mild degenerative facet edema on the right at L3-4  Conus medullaris and cauda equina: Conus extends to the upper L2 level. Conus and cauda equina appear normal.  Paraspinal and other soft tissues: Unremarkable  Disc levels:  L1-2: Unremarkable.  L2-3: Unremarkable.  L3-4: Mild right and borderline left foraminal stenosis due to disc bulge and bilateral degenerative facet arthropathy. Small right facet joint effusion.  L4-5: Borderline left foraminal stenosis due to degenerative facet arthropathy and mild disc bulge. Borderline left subarticular lateral recess stenosis.  L5-S1: Borderline left foraminal stenosis and borderline bilateral subarticular lateral recess stenosis due to disc bulge, facet arthropathy, and a small right lateral recess disc protrusion.  IMPRESSION: 1. Lumbar spondylosis, scoliosis, and degenerative disc disease, resulting in mild impingement at L3-4 and borderline impingement at L4-5 and L5-S1. 2. Substantial levoconvex lumbar scoliosis with rotary component. 3. Small right facet joint effusion the L3-4 level.   Electronically  Signed By: Van Clines M.D. On: 10/13/2021 15:53  Lumbar DG (Complete) 4+V: Results for orders placed in visit on 10/06/20 DG Lumbar Spine Complete  Narrative CLINICAL DATA:  Low back pain.  History of scoliosis.  EXAM: LUMBAR SPINE - COMPLETE 4+ VIEW  COMPARISON:  PET CT 09/29/2020.  FINDINGS: Severe lumbar spine scoliosis concave right. Diffuse multilevel degenerative change. No acute or focal bony abnormalities identified. Aortoiliac atherosclerotic vascular calcification.  IMPRESSION: Severe lumbar spine scoliosis concave right. Diffuse  multilevel degenerative change. No acute or focal bony abnormality identified.   Electronically Signed By: Marcello Moores  Register On: 10/07/2020 13:23  Hip Imaging: Hip-R DG 2-3 views: Results for orders placed in visit on 08/05/21 DG Hip Unilat W OR W/O Pelvis 2-3 Views Right  Narrative CLINICAL DATA:  Right-sided hip pain  EXAM: DG HIP (WITH OR WITHOUT PELVIS) 2-3V RIGHT  COMPARISON:  CT 07/18/2021  FINDINGS: There is no evidence of hip fracture or dislocation. There is no evidence of arthropathy or other focal bone abnormality.  IMPRESSION: Negative.   Electronically Signed By: Donavan Foil M.D. On: 08/08/2021 22:54  Complexity Note: Imaging results reviewed.                         ROS  Cardiovascular: No reported cardiovascular signs or symptoms such as High blood pressure, coronary artery disease, abnormal heart rate or rhythm, heart attack, blood thinner therapy or heart weakness and/or failure Pulmonary or Respiratory: Smoking Neurological: Abnormal skin sensations (Peripheral Neuropathy) and Curved spine Psychological-Psychiatric: Anxiousness Gastrointestinal: Reflux or heatburn and Alternating episodes iof diarrhea and constipation (IBS-Irritable bowe syndrome) Genitourinary: Peeing blood Hematological: No reported hematological signs or symptoms such as prolonged bleeding, low or poor functioning  platelets, bruising or bleeding easily, hereditary bleeding problems, low energy levels due to low hemoglobin or being anemic Endocrine: No reported endocrine signs or symptoms such as high or low blood sugar, rapid heart rate due to high thyroid levels, obesity or weight gain due to slow thyroid or thyroid disease Rheumatologic: Constant unexplained fatigue (Chronic Fatigue Syndrome) Musculoskeletal: Negative for myasthenia gravis, muscular dystrophy, multiple sclerosis or malignant hyperthermia Work History: Disabled  Allergies  Ms. Sowle is allergic to morphine, sertraline hcl, sulfa antibiotics, sulfamethoxazole, and sulfonamide derivatives.  Laboratory Chemistry Profile   Renal Lab Results  Component Value Date   BUN 16 05/23/2022   CREATININE 1.04 (H) 05/23/2022   GFR 66.76 12/29/2021   GFRAA >90 05/09/2012   GFRNONAA >60 05/23/2022   SPECGRAV 1.010 12/13/2021   PHUR 7.0 12/13/2021   PROTEINUR Negative 12/13/2021     Electrolytes Lab Results  Component Value Date   NA 133 (L) 05/23/2022   K 3.4 (L) 05/23/2022   CL 98 05/23/2022   CALCIUM 8.6 (L) 05/23/2022   MG 1.8 05/23/2022     Hepatic Lab Results  Component Value Date   AST 21 05/23/2022   ALT 22 05/23/2022   ALBUMIN 3.5 05/23/2022   ALKPHOS 74 05/23/2022     ID No results found for: "LYMEIGGIGMAB", "HIV", "SARSCOV2NAA", "STAPHAUREUS", "MRSAPCR", "HCVAB", "PREGTESTUR", "RMSFIGG", "QFVRPH1IGG", "QFVRPH2IGG"   Bone No results found for: "VD25OH", "ZO109UE4VWU", "JW1191YN8", "GN5621HY8", "25OHVITD1", "25OHVITD2", "65HQIONG2", "TESTOFREE", "TESTOSTERONE"   Endocrine Lab Results  Component Value Date   GLUCOSE 120 (H) 05/23/2022   GLUCOSEU Negative 12/13/2021   TSH 1.44 01/21/2021   FREET4 1.12 01/21/2021     Neuropathy Lab Results  Component Value Date   VITAMINB12 176 (L) 11/16/2021   FOLATE 12.1 11/16/2021     CNS No results found for: "COLORCSF", "APPEARCSF", "RBCCOUNTCSF", "WBCCSF",  "POLYSCSF", "LYMPHSCSF", "EOSCSF", "PROTEINCSF", "GLUCCSF", "JCVIRUS", "CSFOLI", "IGGCSF", "LABACHR", "ACETBL"   Inflammation (CRP: Acute  ESR: Chronic) Lab Results  Component Value Date   CRP <1.0 01/20/2022   ESRSEDRATE 26 01/20/2022     Rheumatology Lab Results  Component Value Date   RF <14 01/20/2022   ANA NEGATIVE 01/20/2022     Coagulation Lab Results  Component Value Date   INR  1.1 10/01/2020   LABPROT 13.6 10/01/2020   PLT 170 05/23/2022     Cardiovascular Lab Results  Component Value Date   HGB 11.6 (L) 05/23/2022   HCT 35.0 (L) 05/23/2022     Screening No results found for: "SARSCOV2NAA", "COVIDSOURCE", "STAPHAUREUS", "MRSAPCR", "HCVAB", "HIV", "PREGTESTUR"   Cancer No results found for: "CEA", "CA125", "LABCA2"   Allergens No results found for: "ALMOND", "APPLE", "ASPARAGUS", "AVOCADO", "BANANA", "BARLEY", "BASIL", "BAYLEAF", "GREENBEAN", "LIMABEAN", "WHITEBEAN", "BEEFIGE", "REDBEET", "BLUEBERRY", "BROCCOLI", "CABBAGE", "MELON", "CARROT", "CASEIN", "CASHEWNUT", "CAULIFLOWER", "CELERY"     Note: Lab results reviewed.  PFSH  Drug: Ms. Sciara  reports no history of drug use. Alcohol:  reports that she does not currently use alcohol. Tobacco:  reports that she has been smoking cigarettes. She has been smoking an average of .25 packs per day. She has never used smokeless tobacco. Medical:  has a past medical history of Anxiety, Breast cancer ([place]), Diverticulitis, Family history of ovarian cancer, GERD (gastroesophageal reflux disease), IBS (irritable bowel syndrome), PONV (postoperative nausea and vomiting), and Scoliosis. Family: family history includes Diverticulitis in her brother and mother; Gout in her father; Heart attack (age of onset: 80) in her father; Heart failure in her father; Hypertension in her father and mother; Osteoarthritis in her mother; Ovarian cancer in her paternal grandmother.  Past Surgical History:  Procedure Laterality Date    ABDOMINAL HYSTERECTOMY     still has ovaries, no gyn cancer, hysterectomy due to endometriosis. NO cervix on exam 01/10/21   BREAST BIOPSY Left 09/15/2020   [place] bx of mass, path pending, Q marker   BREAST BIOPSY Left 09/15/2020   [place] bx of LN, hydromarker, path pending   BREAST RECONSTRUCTION WITH PLACEMENT OF TISSUE EXPANDER AND FLEX HD (ACELLULAR HYDRATED DERMIS) Bilateral 03/17/2021   Procedure: IMMEDIATE BILATERAL BREAST RECONSTRUCTION WITH PLACEMENT OF TISSUE EXPANDER AND FLEX HD (ACELLULAR HYDRATED DERMIS);  Surgeon: Wallace Going, DO;  Location: [place];  Service: Plastics;  Laterality: Bilateral;   COLONOSCOPY WITH PROPOFOL N/A 11/21/2021   Procedure: COLONOSCOPY WITH PROPOFOL;  Surgeon: Lin Landsman, MD;  Location: [facility] ENDOSCOPY;  Service: Gastroenterology;  Laterality: N/A;   IR IMAGING GUIDED PORT INSERTION  10/01/2020   MODIFIED MASTECTOMY Left 03/17/2021   Procedure: LEFT MODIFIED RADICAL MASTECTOMY;  Surgeon: Erroll Luna, MD;  Location: [facility];  Service: General;  Laterality: Left;   PORTA CATH REMOVAL Right 03/17/2021   Procedure: PORTA CATH REMOVAL;  Surgeon: Erroll Luna, MD;  Location: [place];  Service: General;  Laterality: Right;   REMOVAL OF BILATERAL TISSUE EXPANDERS WITH PLACEMENT OF BILATERAL BREAST IMPLANTS Bilateral 05/09/2021   Procedure: REMOVAL OF BILATERAL TISSUE EXPANDERS WITH PLACEMENT OF BILATERAL BREAST IMPLANTS;  Surgeon: Wallace Going, DO;  Location: [facility];  Service: Plastics;  Laterality: Bilateral;  90 min   TOTAL MASTECTOMY Right 03/17/2021   Procedure: TOTAL MASTECTOMY;  Surgeon: Erroll Luna, MD;  Location: [place];  Service: General;  Laterality: Right;   Active Ambulatory Problems    Diagnosis Date Noted   Anxiety state 08/25/2008   TOBACCO ABUSE 12/29/2008   INSOMNIA, CHRONIC 05/06/2009   DEPRESSION, RECURRENT 08/25/2008   INTERNAL  HEMORRHOIDS 01/27/2010   ALLERGIC RHINITIS, SEASONAL 02/09/2009   IBS 10/13/2008   Anal fissure 01/27/2010   OSTEOARTHRITIS, HANDS, BILATERAL 01/04/2010   Arthralgia 10/05/2009   BACK STRAIN, LUMBAR 03/08/2010   Encounter for medical examination to establish care 09/26/2020   Malignant neoplasm of upper-outer  quadrant of left breast in female, estrogen receptor positive ([place]) 09/26/2020   Chronic low back pain (1ry area of Pain) (Bilateral) (R>L) w/o sciatica 10/06/2020   Candidal vulvovaginitis 11/01/2020   Family history of ovarian cancer    Genetic testing 11/22/2020   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[place]) 03/14/2021   Atherosclerosis of aorta ([place]) 07/20/2021   Hepatic steatosis 07/20/2021   B12 deficiency 07/21/2021   Acquired absence of breast 08/16/2021   Family history of heart disease 11/16/2021   Chronic pain syndrome 05/28/2022   Pharmacologic therapy 05/28/2022   Disorder of skeletal system 05/28/2022   Problems influencing health status 05/28/2022   Pain medication agreement signed (12/07/21) 05/29/2022   History of breast cancer (Left) 05/29/2022   History of mastectomy (Bilateral) 05/29/2022   Chronic calf pain (2ry area of Pain) (Bilateral) 05/29/2022   Chronic lower extremity pain (3ry area of Pain) (Bilateral) 05/29/2022   Chronic feet pain (Bilateral) 05/29/2022   Chronic upper extremity pain (4th area of Pain) (Bilateral) (L>R) 05/29/2022   Chronic hand pain (Bilateral) (L>R) 05/29/2022   Chronic generalized pain (5th area of Pain) 05/29/2022   Abnormal MRI, lumbar spine (10/13/2021) 05/29/2022   Resolved Ambulatory Problems    Diagnosis Date Noted   Breast cancer ([place]) 03/17/2021   Abdominal pain 07/18/2021   Dysuria 08/05/2021   Colon cancer screening    Past Medical History:  Diagnosis Date   Anxiety    Diverticulitis    GERD (gastroesophageal reflux disease)    IBS (irritable bowel syndrome)    PONV (postoperative nausea and vomiting)     Scoliosis    Constitutional Exam  General appearance: Well nourished, well developed, and well hydrated. In no apparent acute distress Vitals:   05/29/22 1117  BP: 124/76  Pulse: 67  Resp: 16  SpO2: 100%  Weight: 109 lb (49.4 kg)  Height: 5' (1.524 m)   BMI Assessment: Estimated body mass index is 21.29 kg/m as calculated from the following:   Height as of this encounter: 5' (1.524 m).   Weight as of this encounter: 109 lb (49.4 kg).  BMI interpretation table: BMI level Category Range association with higher incidence of chronic pain  <18 kg/m2 Underweight   18.5-24.9 kg/m2 Ideal body weight   25-29.9 kg/m2 Overweight Increased incidence by 20%  30-34.9 kg/m2 Obese (Class I) Increased incidence by 68%  35-39.9 kg/m2 Severe obesity (Class II) Increased incidence by 136%  >40 kg/m2 Extreme obesity (Class III) Increased incidence by 254%   Patient's current BMI Ideal Body weight  Body mass index is 21.29 kg/m. Ideal body weight: 45.5 kg (100 lb 4.9 oz) Adjusted ideal body weight: 47.1 kg (103 lb 12.6 oz)   BMI Readings from Last 4 Encounters:  05/29/22 21.29 kg/m  05/23/22 21.29 kg/m  05/17/22 21.33 kg/m  05/02/22 21.87 kg/m   Wt Readings from Last 4 Encounters:  05/29/22 109 lb (49.4 kg)  05/23/22 109 lb (49.4 kg)  05/17/22 109 lb 3.8 oz (49.6 kg)  05/02/22 112 lb (50.8 kg)    Psych/Mental status: Alert, oriented x 3 (person, place, & time)       Eyes: PERLA Respiratory: No evidence of acute respiratory distress  Assessment  Primary Diagnosis & Pertinent Problem List: The primary encounter diagnosis was Chronic pain syndrome. Diagnoses of Pharmacologic therapy, Disorder of skeletal system, Problems influencing health status, Chronic use of opiate for therapeutic purpose, Chronic low back pain (1ry area of Pain) (Bilateral) (R>L) w/o sciatica, Pain medication agreement signed (12/07/21), History of  breast cancer (Left), History of mastectomy (Bilateral), Chronic  lower extremity pain (3ry area of Pain) (Bilateral), Chronic calf pain (2ry area of Pain) (Bilateral), Chronic feet pain (Bilateral), Chronic upper extremity pain (4th area of Pain) (Bilateral) (L>R), Chronic hand pain (Bilateral) (L>R), Chronic generalized pain (5th area of Pain), Abnormal MRI, lumbar spine (10/13/2021), and 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[place]) were also pertinent to this visit.  Visit Diagnosis (New problems to examiner): 1. Chronic pain syndrome   2. Pharmacologic therapy   3. Disorder of skeletal system   4. Problems influencing health status   5. Chronic use of opiate for therapeutic purpose   6. Chronic low back pain (1ry area of Pain) (Bilateral) (R>L) w/o sciatica   7. Pain medication agreement signed (12/07/21)   8. History of breast cancer (Left)   9. History of mastectomy (Bilateral)   10. Chronic lower extremity pain (3ry area of Pain) (Bilateral)   11. Chronic calf pain (2ry area of Pain) (Bilateral)   12. Chronic feet pain (Bilateral)   13. Chronic upper extremity pain (4th area of Pain) (Bilateral) (L>R)   14. Chronic hand pain (Bilateral) (L>R)   15. Chronic generalized pain (5th area of Pain)   16. Abnormal MRI, lumbar spine (10/13/2021)   17. 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[place])    Plan of Care (Initial workup plan)  Note: Ms. Fredricksen was reminded that as per protocol, today's visit has been an evaluation only. We have not taken over the patient's controlled substance management.  Problem-specific plan: No problem-specific Assessment & Plan notes found for this encounter.  Lab Orders         Compliance Drug Analysis, Ur         Sedimentation rate         25-Hydroxy vitamin D Lcms D2+D3         C-reactive protein     Imaging Orders         DG Lumbar Spine Complete W/Bend         DG Thoracic Spine 4V         DG Cervical Spine With Flex & Extend     Referral Orders  No referral(s) requested today   Procedure Orders    No procedure(s) ordered  today   Pharmacotherapy (current): Medications ordered:  No orders of the defined types were placed in this encounter.  Medications administered during this visit: Michel Eskelson. Lomeli "Steffanie Dunn" had no medications administered during this visit.   Pharmacological management options:  Opioid Analgesics: The patient was informed that there is no guarantee that she would be a candidate for opioid analgesics. The decision will be made following CDC guidelines. This decision will be based on the results of diagnostic studies, as well as Ms. Winer's risk profile.   Membrane stabilizer: To be determined at a later time  Muscle relaxant: To be determined at a later time  NSAID: To be determined at a later time  Other analgesic(s): To be determined at a later time   Interventional management options: Ms. Fentress was informed that there is no guarantee that she would be a candidate for interventional therapies. The decision will be based on the results of diagnostic studies, as well as Ms. Mcneel's risk profile.  Procedure(s) under consideration:  Pending results of ordered studies      Interventional Therapies  Risk  Complexity Considerations:   Estimated body mass index is 21.29 kg/m as calculated from the following:   Height as of this encounter: 5' (1.524 m).  Weight as of this encounter: 109 lb (49.4 kg). WNL   Planned  Pending:   Referral to neurology for nerve conduction test (EMG/PNCV) of the lower and upper extremities (Bilateral)    Under consideration:   Diagnostic/therapeutic (Midline) caudal ESI + epidurogram #1  Diagnostic/therapeutic bilateral L3 transforaminal ESI #1  Diagnostic bilateral lumbar facet MBB (to include the L3-4 level) #1    Completed:   None at this time   Completed by other providers:   Therapeutic bilateral L3, L4, and L5 medial branch RFA x1 (02/16/2022) by Sharlet Salina, DO  Diagnostic/therapeutic bilateral L3, L4, and L5 MBB x2  (12/30/2021; 01/17/2022) by Sharlet Salina, DO  Diagnostic/therapeutic right L5-S1 TESI + right S1 TESI x1 (11/29/2021) by Sharlet Salina, DO  Therapeutic sacral trigger point injections x1 (09/30/2021) by Rosalia Hammers, DO    Therapeutic  Palliative (PRN) options:   None established    Provider-requested follow-up: Return for (50mn), Eval-day (M,W), (F2F), 2nd Visit, for review of ordered tests.  Future Appointments  Date Time Provider DGolinda 06/02/2022  8:30 AM [facility]MO LAB [facility] None  06/02/2022  9:00 AM Vaslow, ZAcey Lav MD [facility] None  06/02/2022 10:00 AM [facility]-FLUID CLINIC 2 [facility] None  06/07/2022  9:30 AM [facility]-PORT FLUSH [facility] None  06/07/2022 10:00 AM C[place]ton, SHolli Humbles PA-C [facility] None  06/07/2022 10:30 AM [facility]- MO INFUSION CHAIR 8 [facility] None  06/12/2022  1:15 PM [facility] INJECTION [facility] None  06/29/2022 10:00 AM Scheeler, MCarola Rhine PA-C [facility] None  07/05/2022 10:00 AM NMilinda Pointer MD [facility] None  07/21/2022 10:00 AM Scheeler, MCarola Rhine PA-C [facility] None  08/01/2022 10:30 AM ABurnard Hawthorne FNP [facility]  08/02/2022 10:00 AM CNoreene Filbert MD [facility] None  08/14/2022 10:10 AM RSuanne Marker PTA [facility] None  12/13/2022 11:30 AM McGowan, SHunt Oris PA-C [facility] None    Note by: FGaspar Cola MD Date: 05/29/2022; Time: 2:14 PM

## 2022-05-29 ENCOUNTER — Ambulatory Visit: Payer: No Typology Code available for payment source | Attending: Pain Medicine | Admitting: Pain Medicine

## 2022-05-29 ENCOUNTER — Telehealth: Payer: Self-pay | Admitting: *Deleted

## 2022-05-29 ENCOUNTER — Other Ambulatory Visit (HOSPITAL_COMMUNITY): Payer: Self-pay

## 2022-05-29 ENCOUNTER — Other Ambulatory Visit: Payer: Self-pay

## 2022-05-29 ENCOUNTER — Encounter: Payer: Self-pay | Admitting: Pain Medicine

## 2022-05-29 VITALS — BP 124/76 | HR 67 | Resp 16 | Ht 60.0 in | Wt 109.0 lb

## 2022-05-29 DIAGNOSIS — M899 Disorder of bone, unspecified: Secondary | ICD-10-CM | POA: Diagnosis present

## 2022-05-29 DIAGNOSIS — Z853 Personal history of malignant neoplasm of breast: Secondary | ICD-10-CM | POA: Insufficient documentation

## 2022-05-29 DIAGNOSIS — M545 Low back pain, unspecified: Secondary | ICD-10-CM | POA: Insufficient documentation

## 2022-05-29 DIAGNOSIS — M79672 Pain in left foot: Secondary | ICD-10-CM | POA: Insufficient documentation

## 2022-05-29 DIAGNOSIS — M79604 Pain in right leg: Secondary | ICD-10-CM | POA: Insufficient documentation

## 2022-05-29 DIAGNOSIS — M79661 Pain in right lower leg: Secondary | ICD-10-CM | POA: Diagnosis present

## 2022-05-29 DIAGNOSIS — Z79891 Long term (current) use of opiate analgesic: Secondary | ICD-10-CM | POA: Insufficient documentation

## 2022-05-29 DIAGNOSIS — Z789 Other specified health status: Secondary | ICD-10-CM | POA: Insufficient documentation

## 2022-05-29 DIAGNOSIS — R937 Abnormal findings on diagnostic imaging of other parts of musculoskeletal system: Secondary | ICD-10-CM | POA: Diagnosis present

## 2022-05-29 DIAGNOSIS — Z79899 Other long term (current) drug therapy: Secondary | ICD-10-CM | POA: Insufficient documentation

## 2022-05-29 DIAGNOSIS — M79641 Pain in right hand: Secondary | ICD-10-CM | POA: Insufficient documentation

## 2022-05-29 DIAGNOSIS — T451X5A Adverse effect of antineoplastic and immunosuppressive drugs, initial encounter: Secondary | ICD-10-CM | POA: Diagnosis present

## 2022-05-29 DIAGNOSIS — Z9013 Acquired absence of bilateral breasts and nipples: Secondary | ICD-10-CM | POA: Diagnosis present

## 2022-05-29 DIAGNOSIS — M79671 Pain in right foot: Secondary | ICD-10-CM | POA: Insufficient documentation

## 2022-05-29 DIAGNOSIS — G620 Drug-induced polyneuropathy: Secondary | ICD-10-CM | POA: Insufficient documentation

## 2022-05-29 DIAGNOSIS — M79642 Pain in left hand: Secondary | ICD-10-CM | POA: Insufficient documentation

## 2022-05-29 DIAGNOSIS — M79601 Pain in right arm: Secondary | ICD-10-CM | POA: Insufficient documentation

## 2022-05-29 DIAGNOSIS — G894 Chronic pain syndrome: Secondary | ICD-10-CM | POA: Insufficient documentation

## 2022-05-29 DIAGNOSIS — R52 Pain, unspecified: Secondary | ICD-10-CM | POA: Insufficient documentation

## 2022-05-29 DIAGNOSIS — M79602 Pain in left arm: Secondary | ICD-10-CM | POA: Diagnosis present

## 2022-05-29 DIAGNOSIS — M79662 Pain in left lower leg: Secondary | ICD-10-CM | POA: Insufficient documentation

## 2022-05-29 DIAGNOSIS — Z0289 Encounter for other administrative examinations: Secondary | ICD-10-CM | POA: Insufficient documentation

## 2022-05-29 DIAGNOSIS — M79605 Pain in left leg: Secondary | ICD-10-CM | POA: Insufficient documentation

## 2022-05-29 DIAGNOSIS — G8929 Other chronic pain: Secondary | ICD-10-CM | POA: Diagnosis present

## 2022-05-29 NOTE — Telephone Encounter (Signed)
Fax to med watch for the MRI that pt had done on 7/17. It was abnormal and I sent the results and on the mri it is suggested to redo the scan again in 6 to 12 weeks. Fax to [phone]., the fax did go through

## 2022-05-29 NOTE — Progress Notes (Signed)
Safety precautions to be maintained throughout the outpatient stay will include: orient to surroundings, keep bed in low position, maintain call bell within reach at all times, provide assistance with transfer out of bed and ambulation.  

## 2022-05-31 ENCOUNTER — Encounter: Payer: Self-pay | Admitting: Oncology

## 2022-05-31 ENCOUNTER — Other Ambulatory Visit (HOSPITAL_COMMUNITY): Payer: Self-pay

## 2022-05-31 ENCOUNTER — Other Ambulatory Visit: Payer: Self-pay | Admitting: Pharmacist

## 2022-05-31 ENCOUNTER — Telehealth: Payer: Self-pay | Admitting: Pharmacist

## 2022-05-31 DIAGNOSIS — C50412 Malignant neoplasm of upper-outer quadrant of left female breast: Secondary | ICD-10-CM

## 2022-05-31 MED ORDER — ABEMACICLIB 100 MG PO TABS
100.0000 mg | ORAL_TABLET | Freq: Two times a day (BID) | ORAL | 0 refills | Status: DC
  Filled 2022-05-31 (×2): qty 56, 28d supply, fill #0

## 2022-05-31 NOTE — Telephone Encounter (Signed)
Pt left voicemail wanting to speak with [facility] after receiving voicemail.   Callback # [phone].  Thanks

## 2022-05-31 NOTE — Telephone Encounter (Signed)
Oral Chemotherapy Pharmacist Encounter   Ms. Gallus called to say that she is feeling back to her baseline since coming into [facility] clinic last week. She was calling about resuming her Verzenio (abemaciclib). Due to her issues on 150mg  dosing, she will be reduced to 100mg  dosing.   She is scheduled to RTC on Friday 06/02/22 and will have labs checked at that time. Pending her labs results, she will be given the go ahead to resume abemaciclib at 100mg  twice daily. She already has an appt scheduled for 06/07/22, which will be a good check in post resuming abemaciclib.  Ms. Dyk stated her understanding of the plan.   Also reviewed taking ondansetron as a premedication given her experience with nausea last week.   Darl Pikes, PharmD, BCPS, BCOP, CPP Hematology/Oncology Clinical Pharmacist [place]/DB/AP [facility] [phone]  05/31/2022 12:01 PM

## 2022-05-31 NOTE — Telephone Encounter (Signed)
Called patient back this afternoon at 3:04pm. No answer. Did not leave VMM.

## 2022-06-01 ENCOUNTER — Other Ambulatory Visit (HOSPITAL_COMMUNITY): Payer: Self-pay

## 2022-06-01 LAB — COMPLIANCE DRUG ANALYSIS, UR

## 2022-06-01 NOTE — Telephone Encounter (Signed)
Called patient again, 06/01/22, No answer. Did not leave VMM

## 2022-06-02 ENCOUNTER — Inpatient Hospital Stay: Payer: No Typology Code available for payment source

## 2022-06-02 ENCOUNTER — Encounter: Payer: Self-pay | Admitting: Internal Medicine

## 2022-06-02 ENCOUNTER — Inpatient Hospital Stay (HOSPITAL_BASED_OUTPATIENT_CLINIC_OR_DEPARTMENT_OTHER): Payer: No Typology Code available for payment source | Admitting: Internal Medicine

## 2022-06-02 VITALS — BP 112/76 | HR 69 | Temp 97.6°F | Resp 16 | Ht 60.0 in | Wt 103.0 lb

## 2022-06-02 DIAGNOSIS — C7931 Secondary malignant neoplasm of brain: Secondary | ICD-10-CM

## 2022-06-02 DIAGNOSIS — C50412 Malignant neoplasm of upper-outer quadrant of left female breast: Secondary | ICD-10-CM

## 2022-06-02 LAB — COMPREHENSIVE METABOLIC PANEL
ALT: 11 U/L (ref 0–44)
AST: 17 U/L (ref 15–41)
Albumin: 3.8 g/dL (ref 3.5–5.0)
Alkaline Phosphatase: 58 U/L (ref 38–126)
Anion gap: 10 (ref 5–15)
BUN: 13 mg/dL (ref 6–20)
CO2: 27 mmol/L (ref 22–32)
Calcium: 9.2 mg/dL (ref 8.9–10.3)
Chloride: 102 mmol/L (ref 98–111)
Creatinine, Ser: 1.02 mg/dL — ABNORMAL HIGH (ref 0.44–1.00)
GFR, Estimated: >60 mL/min (ref >60)
Glucose, Bld: 137 mg/dL — ABNORMAL HIGH (ref 70–99)
Potassium: 4.7 mmol/L (ref 3.5–5.1)
Sodium: 139 mmol/L (ref 135–145)
Total Bilirubin: 0.2 mg/dL — ABNORMAL LOW (ref 0.3–1.2)
Total Protein: 7.1 g/dL (ref 6.5–8.1)

## 2022-06-02 LAB — CBC WITH DIFFERENTIAL/PLATELET
Abs Immature Granulocytes: 0.02 10*3/uL (ref 0.00–0.07)
Basophils Absolute: 0 10*3/uL (ref 0.0–0.1)
Basophils Relative: 0 %
Eosinophils Absolute: 0.1 10*3/uL (ref 0.0–0.5)
Eosinophils Relative: 1 %
HCT: 39.7 % (ref 36.0–46.0)
Hemoglobin: 12.9 g/dL (ref 12.0–15.0)
Immature Granulocytes: 0 %
Lymphocytes Relative: 40 %
Lymphs Abs: 3 10*3/uL (ref 0.7–4.0)
MCH: 29.7 pg (ref 26.0–34.0)
MCHC: 32.5 g/dL (ref 30.0–36.0)
MCV: 91.3 fL (ref 80.0–100.0)
Monocytes Absolute: 0.5 10*3/uL (ref 0.1–1.0)
Monocytes Relative: 7 %
Neutro Abs: 3.8 10*3/uL (ref 1.7–7.7)
Neutrophils Relative %: 52 %
Platelets: 148 10*3/uL — ABNORMAL LOW (ref 150–400)
RBC: 4.35 MIL/uL (ref 3.87–5.11)
RDW: 16.5 % — ABNORMAL HIGH (ref 11.5–15.5)
WBC: 7.4 10*3/uL (ref 4.0–10.5)
nRBC: 0 % (ref 0.0–0.2)

## 2022-06-02 NOTE — Progress Notes (Signed)
[facility] [place], [place] [address] [phone]   New Patient Evaluation  Date of Service: 06/02/22 Patient Name: Tricia Berry Patient MRN: [id number] Patient DOB: [birth year] Provider: Ventura Sellers, MD  Identifying Statement:  Tricia Berry is a 51 y.o. female with Metastasis to brain [facility]) - Plan: MR [place] who presents for initial consultation and evaluation regarding cancer associated neurologic deficits.    Referring Provider: Burnard Hawthorne, FNP [address] [place],  [place] [address]  Primary Cancer:  Oncologic History: Oncology History  Malignant neoplasm of upper-outer quadrant of left breast in female, estrogen receptor positive ([place])  09/26/2020 Initial Diagnosis   Malignant neoplasm of upper-outer quadrant of left breast in female, estrogen receptor positive ([place])   10/08/2020 - 02/18/2021 Chemotherapy         10/08/2020 Cancer Staging   Staging form: Breast, AJCC 8th Edition - Clinical stage from 10/08/2020: Stage IIIB (cT4, cN1, cM0, G2, ER+, PR+, HER2-) - Signed by Tricia Guadeloupe, MD on 10/08/2020    Genetic Testing   Negative genetic testing. No pathogenic variants identified on the [facility] Common Hereditary Cancers Panel. The report date is 11/18/2020.  The Multi-Cancer Panel offered by [facility] includes sequencing and/or deletion duplication testing of the following 84 genes: AIP, ALK, APC, ATM, AXIN2,BAP1,  BARD1, BLM, BMPR1A, BRCA1, BRCA2, BRIP1, CASR, CDC73, CDH1, CDK4, CDKN1B, CDKN1C, CDKN2A (p14ARF), CDKN2A (p16INK4a), CEBPA, CHEK2, CTNNA1, DICER1, DIS3L2, EGFR (c.2369C>T, p.Thr790Met variant only), EPCAM (Deletion/duplication testing only), FH, FLCN, GATA2, GPC3, GREM1 (Promoter region deletion/duplication testing only), HOXB13 (c.251G>A, p.Gly84Glu), HRAS, KIT, MAX, MEN1, MET, MITF (c.952G>A, p.Glu318Lys variant only), MLH1, MSH2, MSH3, MSH6, MUTYH, NBN,  NF1, NF2, NTHL1, PALB2, PDGFRA, PHOX2B, PMS2, POLD1, POLE, POT1, PRKAR1A, PTCH1, PTEN, RAD50, RAD51C, RAD51D, RB1, RECQL4, RET,  RUNX1, SDHAF2, SDHA (sequence changes only), SDHB, SDHC, SDHD, SMAD4, SMARCA4, SMARCB1, SMARCE1, STK11, SUFU, TERC, TERT, TMEM127, TP53, TSC1, TSC2, VHL, WRN and WT1.    04/11/2021 Cancer Staging   Staging form: Breast, AJCC 8th Edition - Pathologic stage from 04/11/2021: No Stage Recommended (ypT2, pN2a, cM0, G2, ER+, PR+, HER2+) - Signed by Tricia Guadeloupe, MD on 04/11/2021 Stage prefix: Post-therapy Histologic grading system: 3 grade system   04/27/2021 - 06/10/2021 Chemotherapy         07/01/2021 -  Chemotherapy   Patient is on Treatment Plan : BREAST Weekly Paclitaxel + Trastuzumab + Pertuzumab q21d x 8 cycles / Trastuzumab + Pertuzumab q21d x 4 cycles       History of Present Illness: The patient's records from the referring physician were obtained and reviewed and the patient interviewed to confirm this HPI.  Tricia Berry presents today to review brain MRI, as well as neuropathic symptoms.  She describes >1 year history of tingling, pain, burning, cold sensitivity affecting her feet and hands.  Pain is severe in particular at night, with burning in her left big toe keeping her awake.  In addition she describes more chronic aching pain in her lower back.  She also complains of feelings of imbalance, but no falls and no walking aid.  This does not appear to be getting worse in recent months since taxol was discontinued.   She otherwise denies focal neurologic deficits, no focal weakness, numbness, double vision, slurred speech.  No seizures.  Medications: Current Outpatient Medications on File Prior to Visit  Medication Sig Dispense Refill   anastrozole (ARIMIDEX) 1 MG tablet  Take 1 tablet (1 mg total) by mouth daily. 30 tablet 3   CALCIUM-VITAMIN D PO Take 1 tablet by mouth daily at 12 noon.     cyanocobalamin (,VITAMIN B-12,) 1000 MCG/ML injection 1000 mcg (1  mL) intramuscular injection in the thigh ( vastus lateralis) once per month. 3 mL 4   FLUoxetine (PROZAC) 20 MG capsule Take 1 capsule (20 mg total) by mouth every morning. 90 capsule 3   Lactobacillus (PROBIOTIC ACIDOPHILUS PO) Take 1 capsule by mouth daily.     lidocaine-prilocaine (EMLA) cream Apply 1 application topically as needed. 30 g 0   LORazepam (ATIVAN) 0.5 MG tablet Take 1 tablet (0.5 mg total) by mouth at bedtime as needed for up to 30 doses for anxiety. 30 tablet 0   Multiple Vitamins-Minerals (MULTIVITAL PO) Take 1 Dose by mouth daily.     nicotine (NICODERM CQ - DOSED IN MG/24 HOURS) 21 mg/24hr patch use 1 patch daily 28 patch 0   ondansetron (ZOFRAN-ODT) 8 MG disintegrating tablet Take 1 tablet (8 mg total) by mouth every 8 (eight) hours as needed for nausea or vomiting. 45 tablet 2   Oxycodone HCl 10 MG TABS Take 1 tablet (10 mg total) by mouth as directed. 30 tablet 0   pregabalin (LYRICA) 150 MG capsule Take 1 capsule (150 mg total) by mouth in the morning, at noon, and at bedtime. 90 capsule 2   prochlorperazine (COMPAZINE) 10 MG tablet Take 1 tablet (10 mg total) by mouth every 6 (six) hours as needed for nausea or vomiting. 30 tablet 2   rosuvastatin (CRESTOR) 5 MG tablet Take 1 tablet (5 mg total) by mouth every evening. 90 tablet 3   tiZANidine (ZANAFLEX) 4 MG tablet Take 1 tablet (4 mg total) by mouth 3 (three) times daily. 30 tablet 0   trimethoprim (TRIMPEX) 100 MG tablet Take 1 tablet (100 mg total) by mouth daily. 90 tablet 3   VITAMIN D PO Take 2,000 Int'l Units/day by mouth daily at 12 noon.     abemaciclib (VERZENIO) 100 MG tablet Take 1 tablet (100 mg total) by mouth 2 (two) times daily. (Patient not taking: Reported on 06/02/2022) 56 tablet 0   Current Facility-Administered Medications on File Prior to Visit  Medication Dose Route Frequency Provider Last Rate Last Admin   goserelin (ZOLADEX) injection 3.6 mg  3.6 mg Subcutaneous Q28 days Tricia Guadeloupe, MD   3.6  mg at 05/04/21 1150   heparin lock flush 100 unit/mL  500 Units Intravenous Once Tricia Guadeloupe, MD        Allergies:  Allergies  Allergen Reactions   Morphine Nausea And Vomiting and Other (See Comments)    migranes Other reaction(s): Headache Migraine, vomiting   Sertraline Hcl     REACTION: Worsened symptoms of IBS   Sulfa Antibiotics Rash   Sulfamethoxazole Rash   Sulfonamide Derivatives Rash   Past Medical History:  Past Medical History:  Diagnosis Date   Anxiety    Breast cancer ([place])    Diverticulitis    Family history of ovarian cancer    GERD (gastroesophageal reflux disease)    IBS (irritable bowel syndrome)    PONV (postoperative nausea and vomiting)    severe migraine and vomiting post anesthesia   Scoliosis    Past Surgical History:  Past Surgical History:  Procedure Laterality Date   ABDOMINAL HYSTERECTOMY     still has ovaries, no gyn cancer, hysterectomy due to endometriosis. NO cervix on exam 01/10/21  BREAST BIOPSY Left 09/15/2020   [place] bx of mass, path pending, Q marker   BREAST BIOPSY Left 09/15/2020   [place] bx of LN, hydromarker, path pending   BREAST RECONSTRUCTION WITH PLACEMENT OF TISSUE EXPANDER AND FLEX HD (ACELLULAR HYDRATED DERMIS) Bilateral 03/17/2021   Procedure: IMMEDIATE BILATERAL BREAST RECONSTRUCTION WITH PLACEMENT OF TISSUE EXPANDER AND FLEX HD (ACELLULAR HYDRATED DERMIS);  Surgeon: Tricia Going, DO;  Location: [facility];  Service: Plastics;  Laterality: Bilateral;   COLONOSCOPY WITH PROPOFOL N/A 11/21/2021   Procedure: COLONOSCOPY WITH PROPOFOL;  Surgeon: Tricia Landsman, MD;  Location[facility] ENDOSCOPY;  Service: Gastroenterology;  Laterality: N/A;   IR IMAGING GUIDED PORT INSERTION  10/01/2020   MODIFIED MASTECTOMY Left 03/17/2021   Procedure: LEFT MODIFIED RADICAL MASTECTOMY;  Surgeon: Tricia Luna, MD;  Location: [facility];  Service: General;  Laterality: Left;   PORTA CATH REMOVAL Right  03/17/2021   Procedure: PORTA CATH REMOVAL;  Surgeon: Tricia Luna, MD;  Location: [facility];  Service: General;  Laterality: Right;   REMOVAL OF BILATERAL TISSUE EXPANDERS WITH PLACEMENT OF BILATERAL BREAST IMPLANTS Bilateral 05/09/2021   Procedure: REMOVAL OF BILATERAL TISSUE EXPANDERS WITH PLACEMENT OF BILATERAL BREAST IMPLANTS;  Surgeon: Tricia Going, DO;  Location: [facility];  Service: Plastics;  Laterality: Bilateral;  90 min   TOTAL MASTECTOMY Right 03/17/2021   Procedure: TOTAL MASTECTOMY;  Surgeon: Tricia Luna, MD;  Location: [facility];  Service: General;  Laterality: Right;   Social History:  Social History   Socioeconomic History   Marital status: Divorced    Spouse name: Not on file   Number of children: 2   Years of education: Not on file   Highest education level: Not on file  Occupational History   Occupation: Nurse    Employer: [employer]  Tobacco Use   Smoking status: Every Day    Packs/day: 0.25    Types: Cigarettes   Smokeless tobacco: Never   Tobacco comments:    Pt trying to quit now. Has reduced amount significantly  Vaping Use   Vaping Use: Never used  Substance and Sexual Activity   Alcohol use: Not Currently   Drug use: No   Sexual activity: Not Currently    Birth control/protection: None  Other Topics Concern   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 She and her spouse are primary caregivers.    Social Determinants of Health   Financial Resource Strain: Not on file  Food Insecurity: Not on file  Transportation Needs: Not on file  Physical Activity: Not on file  Stress: Not on file  Social Connections: Not on file  Intimate Partner Violence: Not on file   Family History:  Family History  Problem Relation Age of Onset   Hypertension Mother    Osteoarthritis Mother    Diverticulitis Mother     Heart failure Father    Hypertension Father    Gout Father    Heart attack Father 21   Diverticulitis Brother    Ovarian cancer Paternal Grandmother     Review of Systems: Constitutional: Doesn't report fevers, chills or abnormal weight loss Eyes: Doesn't report blurriness of vision Ears, nose, mouth, throat, and face: Doesn't report sore throat Respiratory: Doesn't report cough, dyspnea or wheezes Cardiovascular: Doesn't report palpitation, chest discomfort  Gastrointestinal:  Doesn't report nausea, constipation,  diarrhea GU: Doesn't report incontinence Skin: Doesn't report skin rashes Neurological: Per HPI Musculoskeletal: Doesn't report joint pain Behavioral/Psych: ++anxiety  Physical Exam: Vitals:   06/02/22 0853  BP: 112/76  Pulse: 69  Resp: 16  Temp: 97.6 F (36.4 C)  SpO2: 100%   KPS: 90. General: Alert, cooperative, pleasant, in no acute distress Head: Normal EENT: No conjunctival injection or scleral icterus.  Lungs: Resp effort normal Cardiac: Regular rate Abdomen: Non-distended abdomen Skin: No rashes cyanosis or petechiae. Extremities: No clubbing or edema  Neurologic Exam: Mental Status: Awake, alert, attentive to examiner. Oriented to self and environment. Language is fluent with intact comprehension.  Cranial Nerves: Visual acuity is grossly normal. Visual fields are full. Extra-ocular movements intact. No ptosis. Face is symmetric Motor: Tone and bulk are normal. Power is full in both arms and legs. Reflexes are diminished, no pathologic reflexes present.  Sensory: Stocking neuropathy changes Gait: Normal.   Labs: I have reviewed the data as listed    Component Value Date/Time   NA 139 06/02/2022 0839   K 4.7 06/02/2022 0839   CL 102 06/02/2022 0839   CO2 27 06/02/2022 0839   GLUCOSE 137 (H) 06/02/2022 0839   BUN 13 06/02/2022 0839   CREATININE 1.02 (H) 06/02/2022 0839   CALCIUM 9.2 06/02/2022 0839   PROT 7.1 06/02/2022 0839   ALBUMIN 3.8  06/02/2022 0839   AST 17 06/02/2022 0839   ALT 11 06/02/2022 0839   ALKPHOS 58 06/02/2022 0839   BILITOT 0.2 (L) 06/02/2022 0839   GFRNONAA >60 06/02/2022 0839   GFRAA >90 05/09/2012 1731   Lab Results  Component Value Date   WBC 7.4 06/02/2022   NEUTROABS 3.8 06/02/2022   HGB 12.9 06/02/2022   HCT 39.7 06/02/2022   MCV 91.3 06/02/2022   PLT 148 (L) 06/02/2022    Imaging:  ECHOCARDIOGRAM COMPLETE  Result Date: 05/09/2022    ECHOCARDIOGRAM REPORT   Patient Name:   Tricia Berry Date of Exam: 05/09/2022 Medical Rec #:  [id number]           Height:       60.0 in Accession #:    [id number]          Weight:       112.0 lb Date of Birth:  [birth year]           BSA:          1.459 m Patient Age:    34 years            BP:           128/78 mmHg Patient Gender: F                   HR:           57 bpm. Exam Location:  [facility] Procedure: 2D Echo, Cardiac Doppler, Color Doppler and Strain Analysis Indications:     Malignant neoplasm upper outer quadrant left breast, estrogen                  receptor positive  History:         Patient has prior history of Echocardiogram examinations, most                  recent 01/30/2022. Breast cancer; Risk Factors:Current Smoker.  Sonographer:     Tricia Berry Referring Phys:  [id number] Tricia Berry Diagnosing Phys: Tricia Bush MD  Sonographer Comments: Suboptimal parasternal window. Image acquisition challenging due  to respiratory motion, Image acquisition challenging due to mastectomy and Image acquisition challenging due to breast implants. Global longitudinal strain was attempted. IMPRESSIONS  1. Left ventricular ejection fraction, by estimation, is 60 to 65%. The left ventricle has normal function. Left ventricular endocardial border not optimally defined to evaluate regional wall motion. Left ventricular diastolic parameters were normal.  2. Right ventricular systolic function is normal. The right ventricular size is normal.  3. The mitral valve is normal in  structure. Trivial mitral valve regurgitation.  4. The aortic valve is tricuspid. Aortic valve regurgitation is not visualized. No aortic stenosis is present.  5. The inferior vena cava is normal in size with greater than 50% respiratory variability, suggesting right atrial pressure of 3 mmHg. FINDINGS  Left Ventricle: Left ventricular ejection fraction, by estimation, is 60 to 65%. The left ventricle has normal function. Left ventricular endocardial border not optimally defined to evaluate regional wall motion. Global longitudinal strain performed but  not reported based on interpreter judgement due to suboptimal tracking. The left ventricular internal cavity size was normal in size. There is borderline left ventricular hypertrophy. Left ventricular diastolic parameters were normal. Right Ventricle: The right ventricular size is normal. No increase in right ventricular wall thickness. Right ventricular systolic function is normal. Left Atrium: Left atrial size was normal in size. Right Atrium: Right atrial size was normal in size. Pericardium: There is no evidence of pericardial effusion. Mitral Valve: The mitral valve is normal in structure. Trivial mitral valve regurgitation. Tricuspid Valve: The tricuspid valve is normal in structure. Tricuspid valve regurgitation is not demonstrated. Aortic Valve: The aortic valve is tricuspid. Aortic valve regurgitation is not visualized. No aortic stenosis is present. Aortic valve mean gradient measures 3.0 mmHg. Aortic valve peak gradient measures 6.6 mmHg. Aortic valve area, by VTI measures 1.79 cm. Pulmonic Valve: The pulmonic valve was normal in structure. Pulmonic valve regurgitation is trivial. No evidence of pulmonic stenosis. Aorta: The aortic root and ascending aorta are structurally normal, with no evidence of dilitation. Pulmonary Artery: The pulmonary artery is of normal size. Venous: The inferior vena cava is normal in size with greater than 50% respiratory  variability, suggesting right atrial pressure of 3 mmHg. IAS/Shunts: No atrial level shunt detected by color flow Doppler.  LEFT VENTRICLE PLAX 2D LVIDd:         3.92 cm   Diastology LVIDs:         2.51 cm   LV e' medial:    12.70 cm/s LV PW:         1.00 cm   LV E/e' medial:  6.5 LV IVS:        1.00 cm   LV e' lateral:   12.50 cm/s LVOT diam:     1.70 cm   LV E/e' lateral: 6.6 LV SV:         47 LV SV Index:   32 LVOT Area:     2.27 cm                           3D Volume EF:                          3D EF:        65 %                          LV EDV:  100 ml                          LV ESV:       35 ml                          LV SV:        64 ml RIGHT VENTRICLE RV Basal diam:  3.14 cm RV S prime:     10.90 cm/s TAPSE (M-mode): 2.1 cm LEFT ATRIUM             Index        RIGHT ATRIUM           Index LA diam:        3.10 cm 2.12 cm/m   RA Area:     13.30 cm LA Vol (A2C):   39.0 ml 26.73 ml/m  RA Volume:   28.80 ml  19.74 ml/m LA Vol (A4C):   24.1 ml 16.52 ml/m LA Biplane Vol: 31.4 ml 21.52 ml/m  AORTIC VALVE AV Area (Vmax):    1.71 cm AV Area (Vmean):   1.71 cm AV Area (VTI):     1.79 cm AV Vmax:           128.00 cm/s AV Vmean:          84.000 cm/s AV VTI:            0.264 m AV Peak Grad:      6.6 mmHg AV Mean Grad:      3.0 mmHg LVOT Vmax:         96.60 cm/s LVOT Vmean:        63.200 cm/s LVOT VTI:          0.208 m LVOT/AV VTI ratio: 0.79  AORTA Ao Root diam: 2.60 cm MITRAL VALVE MV Area (PHT): 3.12 cm    SHUNTS MV Decel Time: 243 msec    Systemic VTI:  0.21 m MV E velocity: 83.10 cm/s  Systemic Diam: 1.70 cm MV A velocity: 57.80 cm/s MV E/A ratio:  1.44 Harrell Gave End MD Electronically signed by Tricia Bush MD Signature Date/Time: 05/09/2022/11:43:29 AM    Final    MR Brain W Wo Contrast  Result Date: 05/08/2022 CLINICAL DATA:  Provided history: Malignant neoplasm of upper-outer quadrant of left breast in female, estrogen receptor positive. Breast cancer. EXAM: MRI HEAD WITHOUT AND WITH  CONTRAST TECHNIQUE: Multiplanar, multiecho pulse sequences of the brain and surrounding structures were obtained without and with intravenous contrast. CONTRAST:  32m GADAVIST GADOBUTROL 1 MMOL/ML IV SOLN COMPARISON:  No pertinent prior exams available for comparison. FINDINGS: Brain: No age advanced or lobar predominant parenchymal atrophy. Punctate focus of enhancement within the posteromedial right frontal lobe (series 1027, image 73) (series 13, image 95). Tiny curvilinear focus of enhancement along the high right parietal lobe, favored vascular (series 1027, images 63 and 64). Moderate multifocal T2 FLAIR hyperintense signal abnormality within the cerebral white matter. Advanced T2 FLAIR hyperintense signal abnormality within the pons. There is no acute infarct. No chronic intracranial blood products. No extra-axial fluid collection. No midline shift. Vascular: Maintained flow voids within the proximal large arterial vessels. Skull and upper cervical spine: No focal suspicious marrow lesion. Sinuses/Orbits: No mass or acute finding within the imaged orbits. No significant paranasal sinus disease. Other: Trace fluid within the bilateral mastoid air cells. Impression #1 will be called to the ordering clinician or representative  by the Radiologist Assistant, and communication documented in the PACS or [employer]. IMPRESSION: 1. Punctate enhancing focus within the posteromedial right frontal lobe. 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 A short-interval 6-12 week follow-up brain MRI without and with contrast is recommended for surveillance. 2. Age advanced multifocal T2 FLAIR hyperintense signal abnormality within the cerebral white matter and pons, as described. Findings are nonspecific, but most often secondary to chronic small vessel ischemia. Electronically Signed   By: Tricia Simmering D.O.   On: 05/08/2022 16:02     Assessment/Plan Metastasis to brain [facility]) - Plan: MR BRAIN [place] presents with clinical syndrome consistent with symmetric, length dependent, small and large fiber peripheral neuropathy.  Etiology is exposure to taxol chemotherapy.  She is currently maxed on Lyrica; we discussed adding Elavil 81m HS given nocturnal symptoms.  She would like to defer further medications at this time, however.  Focus of enhancement on brain MRI is of unclear etiology.  Could be very small metastasis vs blood brain barrier disruption NOS.  Will recommend repeating MRI brain in 2 months for further characterization.  [facility] c[facility]w [facility]  She is agreeable with this plan.  We reviewed pathophysiology of chemotherapy induced neuropathy, available treatments, and goals of care.  We spent twenty additional minutes teaching regarding the natural history, biology, and historical experience in the treatment of neurologic complications of cancer.   We appreciate the opportunity to participate in the [facility]   We ask that KBRYSTOL WASILEWSKIreturn to clinic in 2 months following next brain MRI, or sooner as needed.  All questions were answered. The patient knows to call the clinic with any problems, questions or concerns. No barriers to learning were detected.  The total time spent in the encounter was 40 minutes and more than 50% was on counseling and review of test results   ZVentura Sellers MD Medical Director of Neuro-Oncology [facility] [place]08/11/23 10:15 AM

## 2022-06-06 ENCOUNTER — Ambulatory Visit
Admission: RE | Admit: 2022-06-06 | Discharge: 2022-06-06 | Disposition: A | Discharge status: Home / Self Care | Payer: No Typology Code available for payment source | Source: Ambulatory Visit | Attending: Pain Medicine | Admitting: Pain Medicine

## 2022-06-06 ENCOUNTER — Other Ambulatory Visit: Payer: Self-pay | Admitting: Pain Medicine

## 2022-06-06 ENCOUNTER — Ambulatory Visit
Admission: RE | Admit: 2022-06-06 | Discharge: 2022-06-06 | Disposition: A | Discharge status: Home / Self Care | Payer: No Typology Code available for payment source | Attending: Pain Medicine | Admitting: Pain Medicine

## 2022-06-06 DIAGNOSIS — M79642 Pain in left hand: Secondary | ICD-10-CM | POA: Insufficient documentation

## 2022-06-06 DIAGNOSIS — M79661 Pain in right lower leg: Secondary | ICD-10-CM | POA: Insufficient documentation

## 2022-06-06 DIAGNOSIS — Z853 Personal history of malignant neoplasm of breast: Secondary | ICD-10-CM

## 2022-06-06 DIAGNOSIS — R937 Abnormal findings on diagnostic imaging of other parts of musculoskeletal system: Secondary | ICD-10-CM

## 2022-06-06 DIAGNOSIS — M79641 Pain in right hand: Secondary | ICD-10-CM | POA: Insufficient documentation

## 2022-06-06 DIAGNOSIS — R52 Pain, unspecified: Secondary | ICD-10-CM | POA: Insufficient documentation

## 2022-06-06 DIAGNOSIS — M79662 Pain in left lower leg: Secondary | ICD-10-CM | POA: Diagnosis present

## 2022-06-06 DIAGNOSIS — G8929 Other chronic pain: Secondary | ICD-10-CM | POA: Insufficient documentation

## 2022-06-06 DIAGNOSIS — M79601 Pain in right arm: Secondary | ICD-10-CM | POA: Insufficient documentation

## 2022-06-06 DIAGNOSIS — M79605 Pain in left leg: Secondary | ICD-10-CM | POA: Diagnosis present

## 2022-06-06 DIAGNOSIS — M79604 Pain in right leg: Secondary | ICD-10-CM | POA: Insufficient documentation

## 2022-06-06 DIAGNOSIS — M545 Low back pain, unspecified: Secondary | ICD-10-CM | POA: Insufficient documentation

## 2022-06-06 DIAGNOSIS — M79672 Pain in left foot: Secondary | ICD-10-CM | POA: Diagnosis present

## 2022-06-06 DIAGNOSIS — M79602 Pain in left arm: Secondary | ICD-10-CM | POA: Diagnosis present

## 2022-06-06 DIAGNOSIS — M79671 Pain in right foot: Secondary | ICD-10-CM | POA: Insufficient documentation

## 2022-06-07 ENCOUNTER — Other Ambulatory Visit: Payer: Self-pay | Admitting: Oncology

## 2022-06-07 ENCOUNTER — Inpatient Hospital Stay (HOSPITAL_BASED_OUTPATIENT_CLINIC_OR_DEPARTMENT_OTHER): Payer: No Typology Code available for payment source | Admitting: Medical Oncology

## 2022-06-07 ENCOUNTER — Inpatient Hospital Stay: Payer: No Typology Code available for payment source

## 2022-06-07 ENCOUNTER — Ambulatory Visit: Payer: No Typology Code available for payment source

## 2022-06-07 VITALS — BP 126/99 | HR 66 | Temp 98.4°F | Resp 16 | Ht 60.0 in | Wt 109.4 lb

## 2022-06-07 DIAGNOSIS — E2839 Other primary ovarian failure: Secondary | ICD-10-CM

## 2022-06-07 DIAGNOSIS — Z170 Estrogen receptor positive status [ER+]: Secondary | ICD-10-CM

## 2022-06-07 DIAGNOSIS — G8929 Other chronic pain: Secondary | ICD-10-CM

## 2022-06-07 DIAGNOSIS — G620 Drug-induced polyneuropathy: Secondary | ICD-10-CM

## 2022-06-07 DIAGNOSIS — C50412 Malignant neoplasm of upper-outer quadrant of left female breast: Secondary | ICD-10-CM

## 2022-06-07 DIAGNOSIS — Z5111 Encounter for antineoplastic chemotherapy: Secondary | ICD-10-CM | POA: Diagnosis not present

## 2022-06-07 DIAGNOSIS — C7931 Secondary malignant neoplasm of brain: Secondary | ICD-10-CM

## 2022-06-07 DIAGNOSIS — T451X5A Adverse effect of antineoplastic and immunosuppressive drugs, initial encounter: Secondary | ICD-10-CM

## 2022-06-07 DIAGNOSIS — Z5181 Encounter for therapeutic drug level monitoring: Secondary | ICD-10-CM

## 2022-06-07 DIAGNOSIS — M549 Dorsalgia, unspecified: Secondary | ICD-10-CM | POA: Diagnosis not present

## 2022-06-07 DIAGNOSIS — Z7983 Long term (current) use of bisphosphonates: Secondary | ICD-10-CM

## 2022-06-07 LAB — CBC WITH DIFFERENTIAL/PLATELET
Abs Immature Granulocytes: 0.03 10*3/uL (ref 0.00–0.07)
Basophils Absolute: 0.1 10*3/uL (ref 0.0–0.1)
Basophils Relative: 1 %
Eosinophils Absolute: 0 10*3/uL (ref 0.0–0.5)
Eosinophils Relative: 1 %
HCT: 42.1 % (ref 36.0–46.0)
Hemoglobin: 13.4 g/dL (ref 12.0–15.0)
Immature Granulocytes: 0 %
Lymphocytes Relative: 46 %
Lymphs Abs: 3.1 10*3/uL (ref 0.7–4.0)
MCH: 29.1 pg (ref 26.0–34.0)
MCHC: 31.8 g/dL (ref 30.0–36.0)
MCV: 91.5 fL (ref 80.0–100.0)
Monocytes Absolute: 0.6 10*3/uL (ref 0.1–1.0)
Monocytes Relative: 9 %
Neutro Abs: 2.9 10*3/uL (ref 1.7–7.7)
Neutrophils Relative %: 43 %
Platelets: 199 10*3/uL (ref 150–400)
RBC: 4.6 MIL/uL (ref 3.87–5.11)
RDW: 16.8 % — ABNORMAL HIGH (ref 11.5–15.5)
WBC: 6.7 10*3/uL (ref 4.0–10.5)
nRBC: 0 % (ref 0.0–0.2)

## 2022-06-07 LAB — SEDIMENTATION RATE

## 2022-06-07 LAB — 25-HYDROXY VITAMIN D LCMS D2+D3
25-Hydroxy, Vitamin D-2: 1.2 ng/mL
25-Hydroxy, Vitamin D-3: 37 ng/mL
25-Hydroxy, Vitamin D: 38 ng/mL

## 2022-06-07 LAB — COMPREHENSIVE METABOLIC PANEL
ALT: 17 U/L (ref 0–44)
AST: 23 U/L (ref 15–41)
Albumin: 3.8 g/dL (ref 3.5–5.0)
Alkaline Phosphatase: 65 U/L (ref 38–126)
Anion gap: 9 (ref 5–15)
BUN: 7 mg/dL (ref 6–20)
CO2: 25 mmol/L (ref 22–32)
Calcium: 9.2 mg/dL (ref 8.9–10.3)
Chloride: 102 mmol/L (ref 98–111)
Creatinine, Ser: 0.91 mg/dL (ref 0.44–1.00)
GFR, Estimated: >60 mL/min (ref >60)
Glucose, Bld: 127 mg/dL — ABNORMAL HIGH (ref 70–99)
Potassium: 4.2 mmol/L (ref 3.5–5.1)
Sodium: 136 mmol/L (ref 135–145)
Total Bilirubin: 0.5 mg/dL (ref 0.3–1.2)
Total Protein: 7.4 g/dL (ref 6.5–8.1)

## 2022-06-07 LAB — C-REACTIVE PROTEIN: CRP: 5 mg/L (ref 0–10)

## 2022-06-07 MED ORDER — DIPHENHYDRAMINE HCL 25 MG PO CAPS
50.0000 mg | ORAL_CAPSULE | Freq: Once | ORAL | Status: AC
  Administered 2022-06-07: 50 mg via ORAL

## 2022-06-07 MED ORDER — SODIUM CHLORIDE 0.9 % IV SOLN
Freq: Once | INTRAVENOUS | Status: AC
  Filled 2022-06-07: qty 250

## 2022-06-07 MED ORDER — ZOLEDRONIC ACID 4 MG/100ML IV SOLN
INTRAVENOUS | Status: AC
  Filled 2022-06-07: qty 100

## 2022-06-07 MED ORDER — DIPHENHYDRAMINE HCL 25 MG PO CAPS
ORAL_CAPSULE | ORAL | Status: AC
  Filled 2022-06-07: qty 2

## 2022-06-07 MED ORDER — PERTUZ-TRASTUZ-HYALURON-ZZXF 60-60-2000 MG-MG-U/ML CHEMO ~~LOC~~ SOLN
10.0000 mL | Freq: Once | SUBCUTANEOUS | Status: AC
  Administered 2022-06-07: 10 mL via SUBCUTANEOUS
  Filled 2022-06-07: qty 10

## 2022-06-07 MED ORDER — ACETAMINOPHEN 325 MG PO TABS
650.0000 mg | ORAL_TABLET | Freq: Once | ORAL | Status: AC
  Administered 2022-06-07: 650 mg via ORAL

## 2022-06-07 MED ORDER — ZOLEDRONIC ACID 4 MG/100ML IV SOLN
4.0000 mg | INTRAVENOUS | Status: DC
  Administered 2022-06-07: 4 mg via INTRAVENOUS

## 2022-06-07 MED ORDER — SODIUM CHLORIDE 0.9 % IV SOLN
Freq: Once | INTRAVENOUS | Status: DC
  Filled 2022-06-07: qty 250

## 2022-06-07 MED ORDER — ACETAMINOPHEN 325 MG PO TABS
ORAL_TABLET | ORAL | Status: AC
  Filled 2022-06-07: qty 2

## 2022-06-07 NOTE — Progress Notes (Signed)
She says she gets nauseated and has constipation when she is on verzenio.

## 2022-06-07 NOTE — Progress Notes (Signed)
Hematology/Oncology Consult note [facility]  [phone] [phone]  Patient Care Team: Burnard Hawthorne, FNP as PCP - General (Family Medicine) Kate Sable, MD as PCP - Cardiology (Cardiology) Rico Junker, RN as Oncology Nurse Navigator Sindy Guadeloupe, MD as Consulting Physician (Hematology and Oncology) Gloris Ham, RN as Registered Nurse (Oncology) Borders, Kirt Boys, NP as Nurse Practitioner [facility] and Palliative Medicine)   Name of the patient: Tricia Berry  [id number]  07-Jul-1971   Date of visit: 06/07/22  Diagnosis- invasive mammary carcinoma of the left breast stage III ypT2 ypN2 cM0 ER/PR positive and HER2 positive by [facility]  Chief complaint/ Reason for visit-on treatment assessment prior to cycle 14 of adjuvant Herceptin and Perjeta  Heme/Onc history: 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  She then noticed that her lump is gradually getting larger with an area of ulceration on the skin and underwent a diagnostic bilateral mammogram on 09/08/2020 which showed hypoechoic interconnected masses in the left breast from 10:00 to 2 o'clock position spanning at least an area of 4.8 cm.  Ultrasound also showed 5 abnormal lymph nodes in the axilla with cortical thickening.  Both the mass and the lymph nodes were biopsied and was consistent with invasive mammary carcinoma grade 2.  Tumor was ER +91- 100%, PR +11 to 20% and HER-2 negative.  Ki-67 15%.   PET CT scan showed hypermetabolism in the area of the left breast but no evidence of hypermetabolism in the left axilla Or evidence of distant metastatic disease.   Neoadjuvant dose dense ACT chemotherapy started on 10/08/2020. Interim ultrasound after 4 cycles of dose dense AC chemotherapy  showed overall decrease in volume of the tumor.  Nodularity previously seen on ultrasound was also decreased in size.   Patient completed neoadjuvant AC Taxol chemotherapy and underwent bilateral mastectomy with reconstruction.Final pathology showed fibrocystic changes in the right breast with no malignancy.  3.6 cm invasive mammary carcinoma in the left breast.  7 out of 16 lymph nodes positive for malignancy.  Treatment effect in the breast present but not robust.  Treatment effect in the lymph nodes minimal.  Margins negative.  Overall grade 2.  Extranodal extension present.  ER greater than 90% positive, PR 15% positive.  HER2 +2 equivocal and positive by FISH on the primary breast specimen.  HER2 testing was however negative in the lymph nodes.  Patient started on adjuvant Herceptin and Perjeta since July 2022.     Interval history- Pt reports that overall she is doing well. She is a bit nervous about the Verenzio as this caused her nausea, vomiting and some diarrhea. Was seen in [facility] and improved- dose reduced by 100 mg daily. She was also seen by Dr. Mickeal Skinner recently for pain and neuropathy- currently managed by her PCP. Dose of her Lyrica was not changed. She continues to wean off of her oxycodone for her chronic back pain which is not thought to be related to her malignancy- currently taking one per day. Has pain management appointment in September. At her appointment with Dr. Mickeal Skinner they discussed a recent MRI she had which showed a small area which could possibly represent metastatic disease. Decision was made to repeat scan in 2 months.   ECOG PS- 1 Pain  scale- 4 Opioid associated constipation- no  Review of systems- Review of Systems  Constitutional:  Positive for malaise/fatigue. Negative for chills, fever and weight loss.  HENT:  Negative for congestion, ear discharge and nosebleeds.   Eyes:  Negative for blurred vision.  Respiratory:  Negative for cough, hemoptysis, sputum production,  shortness of breath and wheezing.   Cardiovascular:  Negative for chest pain, palpitations, orthopnea and claudication.  Gastrointestinal:  Negative for abdominal pain, blood in stool, constipation, diarrhea, heartburn, melena, nausea and vomiting.  Genitourinary:  Negative for dysuria, flank pain, frequency, hematuria and urgency.  Musculoskeletal:  Positive for back pain. Negative for joint pain and myalgias.  Skin:  Negative for rash.  Neurological:  Positive for sensory change (peripheral neuropathy). Negative for dizziness, tingling, focal weakness, seizures, weakness and headaches.  Endo/Heme/Allergies:  Does not bruise/bleed easily.  Psychiatric/Behavioral:  Negative for depression and suicidal ideas. The patient does not have insomnia.       Allergies  Allergen Reactions   Morphine Nausea And Vomiting and Other (See Comments)    migranes Other reaction(s): Headache Migraine, vomiting   Sertraline Hcl     REACTION: Worsened symptoms of IBS   Sulfa Antibiotics Rash   Sulfamethoxazole Rash   Sulfonamide Derivatives Rash     Past Medical History:  Diagnosis Date   Anxiety    Breast cancer ([place])    Diverticulitis    Family history of ovarian cancer    GERD (gastroesophageal reflux disease)    IBS (irritable bowel syndrome)    PONV (postoperative nausea and vomiting)    severe migraine and vomiting post anesthesia   Scoliosis      Past Surgical History:  Procedure Laterality Date   ABDOMINAL HYSTERECTOMY     still has ovaries, no gyn cancer, hysterectomy due to endometriosis. NO cervix on exam 01/10/21   BREAST BIOPSY Left 09/15/2020   [place] bx of mass, path pending, Q marker   BREAST BIOPSY Left 09/15/2020   [place] bx of LN, hydromarker, path pending   BREAST RECONSTRUCTION WITH PLACEMENT OF TISSUE EXPANDER AND FLEX HD (ACELLULAR HYDRATED DERMIS) Bilateral 03/17/2021   Procedure: IMMEDIATE BILATERAL BREAST RECONSTRUCTION WITH PLACEMENT OF TISSUE EXPANDER AND FLEX HD  (ACELLULAR HYDRATED DERMIS);  Surgeon: Wallace Going, DO;  Location: [place];  Service: Plastics;  Laterality: Bilateral;   COLONOSCOPY WITH PROPOFOL N/A 11/21/2021   Procedure: COLONOSCOPY WITH PROPOFOL;  Surgeon: Lin Landsman, MD;  Location: [facility] ENDOSCOPY;  Service: Gastroenterology;  Laterality: N/A;   IR IMAGING GUIDED PORT INSERTION  10/01/2020   MODIFIED MASTECTOMY Left 03/17/2021   Procedure: LEFT MODIFIED RADICAL MASTECTOMY;  Surgeon: Erroll Luna, MD;  Location: [place];  Service: General;  Laterality: Left;   PORTA CATH REMOVAL Right 03/17/2021   Procedure: PORTA CATH REMOVAL;  Surgeon: Erroll Luna, MD;  Location: [place];  Service: General;  Laterality: Right;   REMOVAL OF BILATERAL TISSUE EXPANDERS WITH PLACEMENT OF BILATERAL BREAST IMPLANTS Bilateral 05/09/2021   Procedure: REMOVAL OF BILATERAL TISSUE EXPANDERS WITH PLACEMENT OF BILATERAL BREAST IMPLANTS;  Surgeon: Wallace Going, DO;  Location: [place];  Service: Plastics;  Laterality: Bilateral;  90 min   TOTAL MASTECTOMY Right 03/17/2021   Procedure: TOTAL MASTECTOMY;  Surgeon: Erroll Luna, MD;  Location: [place];  Service: General;  Laterality: Right;    Social History   Socioeconomic History   Marital status: Divorced    Spouse name: Not on file  Number of children: 2   Years of education: Not on file   Highest education level: Not on file  Occupational History   Occupation: Nurse    Employer: [employer]  Tobacco Use   Smoking status: Every Day    Packs/day: 0.25    Types: Cigarettes   Smokeless tobacco: Never   Tobacco comments:    Pt trying to quit now. Has reduced amount significantly  Vaping Use   Vaping Use: Never used  Substance and Sexual Activity   Alcohol use: Not Currently   Drug use: No   Sexual activity: Not Currently    Birth control/protection: None  Other Topics Concern   Not  on file  Social History Narrative   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 She and her spouse are primary caregivers.    Social Determinants of Health   Financial Resource Strain: Not on file  Food Insecurity: Not on file  Transportation Needs: Not on file  Physical Activity: Not on file  Stress: Not on file  Social Connections: Not on file  Intimate Partner Violence: Not on file    Family History  Problem Relation Age of Onset   Hypertension Mother    Osteoarthritis Mother    Diverticulitis Mother    Heart failure Father    Hypertension Father    Gout Father    Heart attack Father 63   Diverticulitis Brother    Ovarian cancer Paternal Grandmother      Current Outpatient Medications:    anastrozole (ARIMIDEX) 1 MG tablet, Take 1 tablet (1 mg total) by mouth daily., Disp: 30 tablet, Rfl: 3   CALCIUM-VITAMIN D PO, Take 1 tablet by mouth daily at 12 noon., Disp: , Rfl:    cyanocobalamin (,VITAMIN B-12,) 1000 MCG/ML injection, 1000 mcg (1 mL) intramuscular injection in the thigh ( vastus lateralis) once per month., Disp: 3 mL, Rfl: 4   FLUoxetine (PROZAC) 20 MG capsule, Take 1 capsule (20 mg total) by mouth every morning., Disp: 90 capsule, Rfl: 3   Lactobacillus (PROBIOTIC ACIDOPHILUS PO), Take 1 capsule by mouth daily., Disp: , Rfl:    LORazepam (ATIVAN) 0.5 MG tablet, Take 1 tablet (0.5 mg total) by mouth at bedtime as needed for up to 30 doses for anxiety., Disp: 30 tablet, Rfl: 0   Multiple Vitamins-Minerals (MULTIVITAL PO), Take 1 Dose by mouth daily., Disp: , Rfl:    nicotine (NICODERM CQ - DOSED IN MG/24 HOURS) 21 mg/24hr patch, use 1 patch daily, Disp: 28 patch, Rfl: 0   ondansetron (ZOFRAN-ODT) 8 MG disintegrating tablet, Take 1 tablet (8 mg total) by mouth every 8 (eight) hours as needed for nausea or vomiting., Disp: 45 tablet, Rfl: 2   Oxycodone HCl 10 MG TABS, Take 1 tablet (10 mg total) by mouth as directed., Disp:  30 tablet, Rfl: 0   pregabalin (LYRICA) 150 MG capsule, Take 1 capsule (150 mg total) by mouth in the morning, at noon, and at bedtime., Disp: 90 capsule, Rfl: 2   tiZANidine (ZANAFLEX) 4 MG tablet, Take 1 tablet (4 mg total) by mouth 3 (three) times daily., Disp: 30 tablet, Rfl: 0   trimethoprim (TRIMPEX) 100 MG tablet, Take 1 tablet (100 mg total) by mouth daily., Disp: 90 tablet, Rfl: 3   VITAMIN D PO, Take 2,000 Int'l Units/day by mouth daily at 12 noon., Disp: , Rfl:    abemaciclib (VERZENIO) 100 MG tablet, Take 1 tablet (  100 mg total) by mouth 2 (two) times daily. (Patient not taking: Reported on 06/02/2022), Disp: 56 tablet, Rfl: 0   prochlorperazine (COMPAZINE) 10 MG tablet, Take 1 tablet (10 mg total) by mouth every 6 (six) hours as needed for nausea or vomiting. (Patient not taking: Reported on 06/07/2022), Disp: 30 tablet, Rfl: 2   rosuvastatin (CRESTOR) 5 MG tablet, Take 1 tablet (5 mg total) by mouth every evening. (Patient not taking: Reported on 06/07/2022), Disp: 90 tablet, Rfl: 3 No current facility-administered medications for this visit.  Facility-Administered Medications Ordered in Other Visits:    0.9 %  sodium chloride infusion, , Intravenous, Once, Sindy Guadeloupe, MD   0.9 %  sodium chloride infusion, , Intravenous, Once, Sindy Guadeloupe, MD   acetaminophen (TYLENOL) tablet 650 mg, 650 mg, Oral, Once, Sindy Guadeloupe, MD   diphenhydrAMINE (BENADRYL) capsule 50 mg, 50 mg, Oral, Once, Sindy Guadeloupe, MD   goserelin (ZOLADEX) injection 3.6 mg, 3.6 mg, Subcutaneous, Q28 days, Sindy Guadeloupe, MD, 3.6 mg at 05/04/21 1150   heparin lock flush 100 unit/mL, 500 Units, Intravenous, Once, Sindy Guadeloupe, MD   pertuz-trastuz-hyaluron-zzxf (PHESGO) 600-600-20000 MG-MG-U/10ML chemo SQ injection maintenance dose 10 mL, 10 mL, Subcutaneous, Once, Sindy Guadeloupe, MD   Zoledronic Acid (ZOMETA) IVPB 4 mg, 4 mg, Intravenous, Q90 days, Sindy Guadeloupe, MD  Physical exam:  Vitals:   06/07/22 1036   BP: (!) 126/99  Pulse: 66  Resp: 16  Temp: 98.4 F (36.9 C)  TempSrc: Oral  Weight: 109 lb 6.4 oz (49.6 kg)  Height: 5' (1.524 m)   Physical Exam Constitutional:      General: She is not in acute distress. Cardiovascular:     Rate and Rhythm: Normal rate and regular rhythm.     Heart sounds: Normal heart sounds.  Pulmonary:     Effort: Pulmonary effort is normal.     Breath sounds: Normal breath sounds.  Abdominal:     General: Bowel sounds are normal.     Palpations: Abdomen is soft.  Skin:    General: Skin is warm and dry.  Neurological:     General: No focal deficit present.     Mental Status: She is alert and oriented to person, place, and time.         Latest Ref Rng & Units 06/07/2022    9:42 AM  CMP  Glucose 70 - 99 mg/dL 127   BUN 6 - 20 mg/dL 7   Creatinine 0.44 - 1.00 mg/dL 0.91   Sodium 135 - 145 mmol/L 136   Potassium 3.5 - 5.1 mmol/L 4.2   Chloride 98 - 111 mmol/L 102   CO2 22 - 32 mmol/L 25   Calcium 8.9 - 10.3 mg/dL 9.2   Total Protein 6.5 - 8.1 g/dL 7.4   Total Bilirubin 0.3 - 1.2 mg/dL 0.5   Alkaline Phos 38 - 126 U/L 65   AST 15 - 41 U/L 23   ALT 0 - 44 U/L 17       Latest Ref Rng & Units 06/07/2022    9:42 AM  CBC  WBC 4.0 - 10.5 K/uL 6.7   Hemoglobin 12.0 - 15.0 g/dL 13.4   Hematocrit 36.0 - 46.0 % 42.1   Platelets 150 - 400 K/uL 199      Assessment and plan- Patient is a 51 y.o. female who is here for following issues:  Stage III left breast cancer: "Patient completed neoadjuvant chemotherapy and was  then noted to have residual disease both in her primary breast mass as well as lymph nodes.  Primary breast mass biomarker testing was ER positive and HER2 positive but lymph node testing was ER positive and HER2 negative.  She is now finishing up adjuvant Herceptin and Perjeta." Today is cycle 16.  RTC 3 weeks for Cycle 17. Plan is to complete 18 cycles total.  Per Dr. Janese Banks she will need an echocardiogram prior.  2.  Chemo-induced  peripheral neuropathy: Chronic and stable. Managed by her PCP. Currently on Lyrica 150 mg 3 times a day. Has upcoming pain management appointment with bridge medication planned.   3.  Chronic back pain: Chronic. See above.   4.  Ovarian suppression: Formerly on Goserelin Q 3 months- last in June 2023. Now switching to Arimidex.  5.  Patient is also receiving Zometa every 3 months as adjuvant treatment for high risk breast cancer and she will be due for her next dose in early November 2023 following today's dose.   6.  Adjuvant abemaciclib initially 150 mg twice daily for 2 years however dose lowered due to side effects. Now 100 mg BID. Pt wishes to hold until after today's treatment.   7. MRI abnormality- Continue follow up and planned MRI with Dr. Mickeal Skinner.    Visit Diagnosis 1. Malignant neoplasm of upper-outer quadrant of left breast in female, estrogen receptor positive ([place])   2. Encounter for antineoplastic chemotherapy   3. Chemotherapy-induced peripheral neuropathy ([place])   4. Chronic back pain, unspecified back location, unspecified back pain laterality   5. Suppression of ovarian secretion   6. Encounter for monitoring zoledronic acid therapy   7. Metastasis to brain [facility])     Nelwyn Salisbury PA-C [place] [facility] 06/07/2022 11:15 AM

## 2022-06-07 NOTE — Patient Instructions (Signed)
[facility] CANCER CTR AT [place]  Discharge Instructions: Thank you for choosing [facility] to provide your oncology and hematology care.  If you have a lab appointment with the [facility], please go directly to the [facility] and check in at the registration area.  Wear comfortable clothing and clothing appropriate for easy access to any Portacath or PICC line.   We strive to give you quality time with your provider. You may need to reschedule your appointment if you arrive late (15 or more minutes).  Arriving late affects you and other patients whose appointments are after yours.  Also, if you miss three or more appointments without notifying the office, you may be dismissed from the clinic at the provider's discretion.      For prescription refill requests, have your pharmacy contact our office and allow 72 hours for refills to be completed.    Today you received the following chemotherapy and/or immunotherapy agents ZOMETA and PHESGO      To help prevent nausea and vomiting after your treatment, we encourage you to take your nausea medication as directed.  BELOW ARE SYMPTOMS THAT SHOULD BE REPORTED IMMEDIATELY: *FEVER GREATER THAN 100.4 F (38 C) OR HIGHER *CHILLS OR SWEATING *NAUSEA AND VOMITING THAT IS NOT CONTROLLED WITH YOUR NAUSEA MEDICATION *UNUSUAL SHORTNESS OF BREATH *UNUSUAL BRUISING OR BLEEDING *URINARY PROBLEMS (pain or burning when urinating, or frequent urination) *BOWEL PROBLEMS (unusual diarrhea, constipation, pain near the anus) TENDERNESS IN MOUTH AND THROAT WITH OR WITHOUT PRESENCE OF ULCERS (sore throat, sores in mouth, or a toothache) UNUSUAL RASH, SWELLING OR PAIN  UNUSUAL VAGINAL DISCHARGE OR ITCHING   Items with * indicate a potential emergency and should be followed up as soon as possible or go to the Emergency Department if any problems should occur.  Please show the CHEMOTHERAPY ALERT CARD or IMMUNOTHERAPY ALERT CARD at  check-in to the Emergency Department and triage nurse.  Should you have questions after your visit or need to cancel or reschedule your appointment, please contact [facility] CANCER [place] AT [place]  [phone] and follow the prompts.  Office hours are 8:00 a.m. to 4:30 p.m. Monday - Friday. Please note that voicemails left after 4:00 p.m. may not be returned until the following business day.  We are closed weekends and major holidays. You have access to a nurse at all times for urgent questions. Please call the main number to the clinic [phone] and follow the prompts.  For any non-urgent questions, you may also contact your provider using MyChart. We now offer e-Visits for anyone 72 and older to request care online for non-urgent symptoms. For details visit [email or web address].   Also download the MyChart app! Go to the app store, search "MyChart", open the app, select [facility], and log in with your MyChart username and password.  Masks are optional in the cancer centers. If you would like for your care team to wear a mask while they are taking care of you, please let them know. For doctor visits, patients may have with them one support person who is at least 52 years old. At this time, visitors are not allowed in the infusion area.  Zoledronic Acid Injection (Cancer) What is this medication? ZOLEDRONIC ACID (ZOE le dron ik AS id) treats high calcium levels in the blood caused by cancer. It may also be used with chemotherapy to treat weakened bones caused by cancer. It works by slowing down the release of calcium from bones. This lowers calcium  levels in your blood. It also makes your bones stronger and less likely to break (fracture). It belongs to a group of medications called bisphosphonates. This medicine may be used for other purposes; ask your health care provider or pharmacist if you have questions. COMMON BRAND NAME(S): Zometa, Zometa Powder What should I tell my care  team before I take this medication? They need to know if you have any of these conditions: Dehydration Dental disease Kidney disease Liver disease Low levels of calcium in the blood Lung or breathing disease, such as asthma Receiving steroids, such as dexamethasone or prednisone An unusual or allergic reaction to zoledronic acid, other medications, foods, dyes, or preservatives Pregnant or trying to get pregnant Breast-feeding How should I use this medication? This medication is injected into a vein. It is given by your care team in a hospital or clinic setting. Talk to your care team about the use of this medication in children. Special care may be needed. Overdosage: If you think you have taken too much of this medicine contact a poison control center or emergency room at once. NOTE: This medicine is only for you. Do not share this medicine with others. What if I miss a dose? Keep appointments for follow-up doses. It is important not to miss your dose. Call your care team if you are unable to keep an appointment. What may interact with this medication? Certain antibiotics given by injection Diuretics, such as bumetanide, furosemide NSAIDs, medications for pain and inflammation, such as ibuprofen or naproxen Teriparatide Thalidomide This list may not describe all possible interactions. Give your health care provider a list of all the medicines, herbs, non-prescription drugs, or dietary supplements you use. Also tell them if you smoke, drink alcohol, or use illegal drugs. Some items may interact with your medicine. What should I watch for while using this medication? Visit your care team for regular checks on your progress. It may be some time before you see the benefit from this medication. Some people who take this medication have severe bone, joint, or muscle pain. This medication may also increase your risk for jaw problems or a broken thigh bone. Tell your care team right away if you  have severe pain in your jaw, bones, joints, or muscles. Tell you care team if you have any pain that does not go away or that gets worse. Tell your dentist and dental surgeon that you are taking this medication. You should not have major dental surgery while on this medication. See your dentist to have a dental exam and fix any dental problems before starting this medication. Take good care of your teeth while on this medication. Make sure you see your dentist for regular follow-up appointments. You should make sure you get enough calcium and vitamin D while you are taking this medication. Discuss the foods you eat and the vitamins you take with your care team. Check with your care team if you have severe diarrhea, nausea, and vomiting, or if you sweat a lot. The loss of too much body fluid may make it dangerous for you to take this medication. You may need bloodwork while taking this medication. Talk to your care team if you wish to become pregnant or think you might be pregnant. This medication can cause serious birth defects. What side effects may I notice from receiving this medication? Side effects that you should report to your care team as soon as possible: Allergic reactions--skin rash, itching, hives, swelling of the face, lips, tongue,  or throat Kidney injury--decrease in the amount of urine, swelling of the ankles, hands, or feet Low calcium level--muscle pain or cramps, confusion, tingling, or numbness in the hands or feet Osteonecrosis of the jaw--pain, swelling, or redness in the mouth, numbness of the jaw, poor healing after dental work, unusual discharge from the mouth, visible bones in the mouth Severe bone, joint, or muscle pain Side effects that usually do not require medical attention (report to your care team if they continue or are bothersome): Constipation Fatigue Fever Loss of appetite Nausea Stomach pain This list may not describe all possible side effects. Call your  doctor for medical advice about side effects. You may report side effects to FDA at 1-800-FDA-1088. Where should I keep my medication? This medication is given in a hospital or clinic. It will not be stored at home. NOTE: This sheet is a summary. It may not cover all possible information. If you have questions about this medicine, talk to your doctor, pharmacist, or health care provider.  2023 Elsevier/Gold Standard (2021-11-24 00:00:00)  Pertuzumab; Trastuzumab; Hyaluronidase Injection What is this medication? PERTUZUMAB; TRASTUZUMAB; HYALURONIDASE (per TOOZ ue mab; tras TOO zoo mab; hye al ur ON i dase) treats breast cancer. Pertuzumab and trastuzumab work by blocking a protein that causes cancer cells to grow and multiply. This helps to slow or stop the spread of cancer cells. Hyaluronidase works by increasing the absorption of other medications in the body to help them work better. It is a combination medication that contains two monoclonal antibodies. This medicine may be used for other purposes; ask your health care provider or pharmacist if you have questions. COMMON BRAND NAME(S): PHESGO What should I tell my care team before I take this medication? They need to know if you have any of these conditions: Heart failure High blood pressure Irregular heartbeat or rhythm Lung disease An unusual or allergic reaction to pertuzumab, trastuzumab, hyaluronidase, other medications, foods, dyes, or preservatives Pregnant or trying to get pregnant Breast-feeding How should I use this medication? This medication is injected under the skin. It is usually given by your care team in a hospital or clinic setting. It may also be given at home by your care team. Talk to your care team about the use of this medication in children. Special care may be needed. Overdosage: If you think you have taken too much of this medicine contact a poison control center or emergency room at once. NOTE: This medicine is  only for you. Do not share this medicine with others. What if I miss a dose? Keep appointments for follow-up doses. It is important not to miss your dose. Call your care team if you are unable to keep an appointment. What may interact with this medication? Certain types of chemotherapy, such as daunorubicin, doxorubicin, epirubicin, idarubicin This list may not describe all possible interactions. Give your health care provider a list of all the medicines, herbs, non-prescription drugs, or dietary supplements you use. Also tell them if you smoke, drink alcohol, or use illegal drugs. Some items may interact with your medicine. What should I watch for while using this medication? Your condition will be monitored carefully while you are receiving this medication. This medication may make you feel generally unwell. This is not uncommon as chemotherapy can affect healthy cells as well as cancer cells. Report any side effects. Continue your course of treatment even though you feel ill unless your care team tells you to stop. This medication may increase your risk  of getting an infection. Call your care team for advice if you get a fever, chills, sore throat, or other symptoms of a cold or flu. Do not treat yourself. Try to avoid being around people who are sick. Avoid taking medications that contain aspirin, acetaminophen, ibuprofen, naproxen, or ketoprofen unless instructed by your care team. These medications may hide a fever. Talk to your care team if you may be pregnant. Serious birth defects can occur if you take this medication during pregnancy and for 7 months after the last dose. You will need a negative pregnancy test before starting this medication. Contraception is recommended while taking this medication and for 7 months after the last dose. Your care team can help you find the option that works for you. Do not breastfeed while taking this medication and for 7 months after the last dose. What side  effects may I notice from receiving this medication? Side effects that you should report to your care team as soon as possible: Allergic reactions or angioedema--skin rash, itching or hives, swelling of the face, eyes, lips, tongue, arms, or legs, trouble swallowing or breathing Dry cough, shortness of breath or trouble breathing Heart failure--shortness of breath, swelling of the ankles, feet, or hands, sudden weight gain, unusual weakness or fatigue Infection--fever, chills, cough, or sore throat Infusion reactions--chest pain, shortness of breath or trouble breathing, feeling faint or lightheaded Side effects that usually do not require medical attention (report these to your care team if they continue or are bothersome): Diarrhea Hair loss Nausea Pain, redness, or irritation at injection site Pain, redness, or swelling with sores inside the mouth or throat Unusual weakness or fatigue This list may not describe all possible side effects. Call your doctor for medical advice about side effects. You may report side effects to FDA at 1-800-FDA-1088. Where should I keep my medication? This medication is given in a hospital or clinic. It will not be stored at home. NOTE: This sheet is a summary. It may not cover all possible information. If you have questions about this medicine, talk to your doctor, pharmacist, or health care provider.  2023 Elsevier/Gold Standard (2022-02-21 00:00:00)

## 2022-06-12 ENCOUNTER — Inpatient Hospital Stay: Payer: No Typology Code available for payment source

## 2022-06-14 ENCOUNTER — Encounter: Payer: Self-pay | Admitting: Oncology

## 2022-06-14 ENCOUNTER — Telehealth: Payer: Self-pay

## 2022-06-14 NOTE — Telephone Encounter (Signed)
Called to cancelled  surgery on  9/20 due to injections series will not be completed until November along with Pre OP and PO appointments. Patient is schedule a televisit with Matt on 08/07/2022

## 2022-06-19 ENCOUNTER — Other Ambulatory Visit: Payer: Self-pay | Admitting: Oncology

## 2022-06-19 ENCOUNTER — Encounter: Payer: Self-pay | Admitting: Oncology

## 2022-06-19 ENCOUNTER — Other Ambulatory Visit: Payer: Self-pay

## 2022-06-19 ENCOUNTER — Telehealth: Payer: Self-pay | Admitting: *Deleted

## 2022-06-19 MED FILL — Lorazepam Tab 0.5 MG: ORAL | 30 days supply | Qty: 30 | Fill #0 | Status: AC

## 2022-06-19 MED FILL — Oxycodone HCl Tab 10 MG: ORAL | 30 days supply | Qty: 30 | Fill #0 | Status: AC

## 2022-06-19 NOTE — Telephone Encounter (Signed)
Nurse called from [facility] to discuss continuation of prescribing patient pain meds and ativan.  Patient told them that they would need to continue therapy for her until [facility] prescribes.  I told her that this was correct, medications are never prescribed on the first visit and may not ever be prescribed.  Patient is not under pain contract but if FN does prescribe that contract will be signed and it will be very clear as to how she should get her medicine.  Nurse is going to report this back to Dr Janese Banks.

## 2022-06-20 ENCOUNTER — Other Ambulatory Visit (HOSPITAL_COMMUNITY): Payer: Self-pay

## 2022-06-20 ENCOUNTER — Other Ambulatory Visit: Payer: Self-pay

## 2022-06-21 ENCOUNTER — Ambulatory Visit: Payer: No Typology Code available for payment source | Admitting: Oncology

## 2022-06-21 ENCOUNTER — Ambulatory Visit: Payer: No Typology Code available for payment source

## 2022-06-21 ENCOUNTER — Other Ambulatory Visit: Payer: No Typology Code available for payment source

## 2022-06-22 ENCOUNTER — Other Ambulatory Visit (HOSPITAL_COMMUNITY): Payer: Self-pay

## 2022-06-26 ENCOUNTER — Other Ambulatory Visit: Payer: Self-pay

## 2022-06-27 ENCOUNTER — Other Ambulatory Visit: Payer: Self-pay

## 2022-06-27 ENCOUNTER — Other Ambulatory Visit (HOSPITAL_COMMUNITY): Payer: Self-pay

## 2022-06-27 ENCOUNTER — Other Ambulatory Visit: Payer: Self-pay | Admitting: Pharmacist

## 2022-06-27 DIAGNOSIS — Z170 Estrogen receptor positive status [ER+]: Secondary | ICD-10-CM

## 2022-06-28 ENCOUNTER — Other Ambulatory Visit: Payer: Self-pay | Admitting: Family

## 2022-06-28 ENCOUNTER — Encounter: Payer: Self-pay | Admitting: Oncology

## 2022-06-28 ENCOUNTER — Other Ambulatory Visit: Payer: Self-pay

## 2022-06-28 ENCOUNTER — Inpatient Hospital Stay (HOSPITAL_BASED_OUTPATIENT_CLINIC_OR_DEPARTMENT_OTHER): Payer: No Typology Code available for payment source | Admitting: Oncology

## 2022-06-28 ENCOUNTER — Inpatient Hospital Stay: Payer: No Typology Code available for payment source | Attending: Oncology

## 2022-06-28 ENCOUNTER — Other Ambulatory Visit: Payer: Self-pay | Admitting: *Deleted

## 2022-06-28 ENCOUNTER — Inpatient Hospital Stay: Payer: No Typology Code available for payment source

## 2022-06-28 ENCOUNTER — Other Ambulatory Visit (HOSPITAL_COMMUNITY): Payer: Self-pay

## 2022-06-28 VITALS — BP 104/72 | HR 73 | Temp 96.5°F | Resp 16 | Wt 113.6 lb

## 2022-06-28 VITALS — BP 109/72 | HR 72 | Temp 97.1°F | Resp 17

## 2022-06-28 DIAGNOSIS — Z79899 Other long term (current) drug therapy: Secondary | ICD-10-CM

## 2022-06-28 DIAGNOSIS — G620 Drug-induced polyneuropathy: Secondary | ICD-10-CM | POA: Diagnosis not present

## 2022-06-28 DIAGNOSIS — Z170 Estrogen receptor positive status [ER+]: Secondary | ICD-10-CM | POA: Diagnosis not present

## 2022-06-28 DIAGNOSIS — C50412 Malignant neoplasm of upper-outer quadrant of left female breast: Secondary | ICD-10-CM

## 2022-06-28 DIAGNOSIS — C7931 Secondary malignant neoplasm of brain: Secondary | ICD-10-CM | POA: Diagnosis present

## 2022-06-28 DIAGNOSIS — M545 Low back pain, unspecified: Secondary | ICD-10-CM

## 2022-06-28 DIAGNOSIS — Z5112 Encounter for antineoplastic immunotherapy: Secondary | ICD-10-CM | POA: Insufficient documentation

## 2022-06-28 DIAGNOSIS — T451X5A Adverse effect of antineoplastic and immunosuppressive drugs, initial encounter: Secondary | ICD-10-CM

## 2022-06-28 DIAGNOSIS — E2839 Other primary ovarian failure: Secondary | ICD-10-CM

## 2022-06-28 LAB — COMPREHENSIVE METABOLIC PANEL
ALT: 20 U/L (ref 0–44)
AST: 20 U/L (ref 15–41)
Albumin: 3.7 g/dL (ref 3.5–5.0)
Alkaline Phosphatase: 61 U/L (ref 38–126)
Anion gap: 8 (ref 5–15)
BUN: 10 mg/dL (ref 6–20)
CO2: 26 mmol/L (ref 22–32)
Calcium: 9.5 mg/dL (ref 8.9–10.3)
Chloride: 104 mmol/L (ref 98–111)
Creatinine, Ser: 1.05 mg/dL — ABNORMAL HIGH (ref 0.44–1.00)
GFR, Estimated: >60 mL/min (ref >60)
Glucose, Bld: 115 mg/dL — ABNORMAL HIGH (ref 70–99)
Potassium: 3.8 mmol/L (ref 3.5–5.1)
Sodium: 138 mmol/L (ref 135–145)
Total Bilirubin: 0.3 mg/dL (ref 0.3–1.2)
Total Protein: 7 g/dL (ref 6.5–8.1)

## 2022-06-28 LAB — CBC WITH DIFFERENTIAL/PLATELET
Abs Immature Granulocytes: 0.01 10*3/uL (ref 0.00–0.07)
Basophils Absolute: 0.1 10*3/uL (ref 0.0–0.1)
Basophils Relative: 1 %
Eosinophils Absolute: 0.1 10*3/uL (ref 0.0–0.5)
Eosinophils Relative: 1 %
HCT: 35.5 % — ABNORMAL LOW (ref 36.0–46.0)
Hemoglobin: 11.7 g/dL — ABNORMAL LOW (ref 12.0–15.0)
Immature Granulocytes: 0 %
Lymphocytes Relative: 44 %
Lymphs Abs: 2.7 10*3/uL (ref 0.7–4.0)
MCH: 30.2 pg (ref 26.0–34.0)
MCHC: 33 g/dL (ref 30.0–36.0)
MCV: 91.7 fL (ref 80.0–100.0)
Monocytes Absolute: 0.3 10*3/uL (ref 0.1–1.0)
Monocytes Relative: 4 %
Neutro Abs: 3.1 10*3/uL (ref 1.7–7.7)
Neutrophils Relative %: 50 %
Platelets: 176 10*3/uL (ref 150–400)
RBC: 3.87 MIL/uL (ref 3.87–5.11)
RDW: 17.3 % — ABNORMAL HIGH (ref 11.5–15.5)
WBC: 6.1 10*3/uL (ref 4.0–10.5)
nRBC: 0 % (ref 0.0–0.2)

## 2022-06-28 MED ORDER — PREGABALIN 150 MG PO CAPS
150.0000 mg | ORAL_CAPSULE | Freq: Three times a day (TID) | ORAL | 2 refills | Status: DC
  Filled 2022-06-28: qty 90, 30d supply, fill #0
  Filled 2022-07-27: qty 90, 30d supply, fill #1
  Filled 2022-08-29: qty 90, 30d supply, fill #2

## 2022-06-28 MED ORDER — ABEMACICLIB 100 MG PO TABS
100.0000 mg | ORAL_TABLET | Freq: Two times a day (BID) | ORAL | 1 refills | Status: DC
  Filled 2022-06-28: qty 56, 28d supply, fill #0
  Filled 2022-07-26: qty 56, 28d supply, fill #1

## 2022-06-28 MED ORDER — DIPHENHYDRAMINE HCL 25 MG PO CAPS
50.0000 mg | ORAL_CAPSULE | Freq: Once | ORAL | Status: AC
  Administered 2022-06-28: 50 mg via ORAL

## 2022-06-28 MED ORDER — ACETAMINOPHEN 325 MG PO TABS
650.0000 mg | ORAL_TABLET | Freq: Once | ORAL | Status: AC
  Administered 2022-06-28: 650 mg via ORAL

## 2022-06-28 MED ORDER — PERTUZ-TRASTUZ-HYALURON-ZZXF 60-60-2000 MG-MG-U/ML CHEMO ~~LOC~~ SOLN
10.0000 mL | Freq: Once | SUBCUTANEOUS | Status: AC
  Administered 2022-06-28: 10 mL via SUBCUTANEOUS
  Filled 2022-06-28: qty 10

## 2022-06-28 NOTE — Patient Instructions (Signed)
[facility] CANCER CTR AT [place]  Discharge Instructions: Thank you for choosing [facility] to provide your oncology and hematology care.  If you have a lab appointment with the [facility], please go directly to the [facility] and check in at the registration area.  Wear comfortable clothing and clothing appropriate for easy access to any Portacath or PICC line.   We strive to give you quality time with your provider. You may need to reschedule your appointment if you arrive late (15 or more minutes).  Arriving late affects you and other patients whose appointments are after yours.  Also, if you miss three or more appointments without notifying the office, you may be dismissed from the clinic at the provider's discretion.      For prescription refill requests, have your pharmacy contact our office and allow 72 hours for refills to be completed.    Today you received the following chemotherapy and/or immunotherapy agents phesgo      To help prevent nausea and vomiting after your treatment, we encourage you to take your nausea medication as directed.  BELOW ARE SYMPTOMS THAT SHOULD BE REPORTED IMMEDIATELY: *FEVER GREATER THAN 100.4 F (38 C) OR HIGHER *CHILLS OR SWEATING *NAUSEA AND VOMITING THAT IS NOT CONTROLLED WITH YOUR NAUSEA MEDICATION *UNUSUAL SHORTNESS OF BREATH *UNUSUAL BRUISING OR BLEEDING *URINARY PROBLEMS (pain or burning when urinating, or frequent urination) *BOWEL PROBLEMS (unusual diarrhea, constipation, pain near the anus) TENDERNESS IN MOUTH AND THROAT WITH OR WITHOUT PRESENCE OF ULCERS (sore throat, sores in mouth, or a toothache) UNUSUAL RASH, SWELLING OR PAIN  UNUSUAL VAGINAL DISCHARGE OR ITCHING   Items with * indicate a potential emergency and should be followed up as soon as possible or go to the Emergency Department if any problems should occur.  Please show the CHEMOTHERAPY ALERT CARD or IMMUNOTHERAPY ALERT CARD at check-in to the  Emergency Department and triage nurse.  Should you have questions after your visit or need to cancel or reschedule your appointment, please contact [facility] CANCER [place] AT [place]  [phone] and follow the prompts.  Office hours are 8:00 a.m. to 4:30 p.m. Monday - Friday. Please note that voicemails left after 4:00 p.m. may not be returned until the following business day.  We are closed weekends and major holidays. You have access to a nurse at all times for urgent questions. Please call the main number to the clinic [phone] and follow the prompts.  For any non-urgent questions, you may also contact your provider using MyChart. We now offer e-Visits for anyone 14 and older to request care online for non-urgent symptoms. For details visit [email or web address].   Also download the MyChart app! Go to the app store, search "MyChart", open the app, select [facility], and log in with your MyChart username and password.  Masks are optional in the cancer centers. If you would like for your care team to wear a mask while they are taking care of you, please let them know. For doctor visits, patients may have with them one support person who is at least 52 years old. At this time, visitors are not allowed in the infusion area.

## 2022-06-28 NOTE — Progress Notes (Signed)
OFF PATHWAY REGIMEN - Other  No Change  Continue With Treatment as Ordered.  Original Decision Date/Time: 04/11/2021 16:41   [id number]:Pertuzumab 420 mg IV + Trastuzumab 6 mg/kg IV q21 Days:   A cycle is every 21 days:     Trastuzumab-xxxx      Pertuzumab   **Always confirm dose/schedule in your pharmacy ordering system**  Patient Characteristics: Intent of Therapy: Curative Intent, Discussed with Patient

## 2022-06-28 NOTE — Progress Notes (Signed)
1652: Pt monitored post Phesgo 15 mins per order. Pt tolerated treatment well with no concerns. Pt stable at discharge.

## 2022-06-29 ENCOUNTER — Other Ambulatory Visit: Payer: Self-pay

## 2022-06-29 ENCOUNTER — Encounter: Payer: No Typology Code available for payment source | Admitting: Surgical

## 2022-06-30 ENCOUNTER — Other Ambulatory Visit: Payer: Self-pay

## 2022-07-02 ENCOUNTER — Encounter: Payer: Self-pay | Admitting: Oncology

## 2022-07-02 NOTE — Progress Notes (Signed)
Hematology/Oncology Consult note [facility]  [phone] [phone]  Patient Care Team: Burnard Hawthorne, FNP as PCP - General (Family Medicine) Kate Sable, MD as PCP - Cardiology (Cardiology) Rico Junker, RN as Oncology Nurse Navigator Sindy Guadeloupe, MD as Consulting Physician (Hematology and Oncology) Gloris Ham, RN as Registered Nurse (Oncology) Borders, Kirt Boys, NP as Nurse Practitioner [facility])   Name of the patient: Tricia Berry  [id number]  September 04, 1971   Date of visit: 07/02/22  Diagnosis- invasive mammary carcinoma of the left breast stage III ypT2 ypN2 cM0 ER/PR positive and HER2 positive by [facility]  Chief complaint/ Reason for visit: Routine follow-up of breast cancer on Verzenio  Heme/Onc history: 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  She then noticed that her lump is gradually getting larger with an area of ulceration on the skin and underwent a diagnostic bilateral mammogram on 09/08/2020 which showed hypoechoic interconnected masses in the left breast from 10:00 to 2 o'clock position spanning at least an area of 4.8 cm.  Ultrasound also showed 5 abnormal lymph nodes in the axilla with cortical thickening.  Both the mass and the lymph nodes were biopsied and was consistent with invasive mammary carcinoma grade 2.  Tumor was ER +91- 100%, PR +11 to 20% and HER-2 negative.  Ki-67 15%.    PET CT scan showed hypermetabolism in the area of the left breast but no evidence of hypermetabolism in the left axilla Or evidence of distant metastatic disease.   Neoadjuvant dose dense ACT chemotherapy started on 10/08/2020. Interim ultrasound after 4 cycles of dose dense AC chemotherapy showed overall decrease in  volume of the tumor.  Nodularity previously seen on ultrasound was also decreased in size.   Patient completed neoadjuvant AC Taxol chemotherapy and underwent bilateral mastectomy with reconstruction.Final pathology showed fibrocystic changes in the right breast with no malignancy.  3.6 cm invasive mammary carcinoma in the left breast.  7 out of 16 lymph nodes positive for malignancy.  Treatment effect in the breast present but not robust.  Treatment effect in the lymph nodes minimal.  Margins negative.  Overall grade 2.  Extranodal extension present.  ER greater than 90% positive, PR 15% positive.  HER2 +2 equivocal and positive by FISH on the primary breast specimen.  HER2 testing was however negative in the lymph nodes.  Patient completed 1 year of adjuvant Herceptin and Perjeta in August 2023.   Interval history-patient reports ongoing fatigue and somewhat worse after starting Verzenio.  She still has peripheral neuropathy in her hands and feet and is following up with pain clinic.  Reports nausea has been worse after she started Verzenio.  ECOG PS- 1 Pain scale- 0   Review of systems- Review of Systems  Constitutional:  Positive for malaise/fatigue. Negative for chills, fever and weight loss.  HENT:  Negative for congestion, ear discharge and nosebleeds.   Eyes:  Negative for blurred vision.  Respiratory:  Negative for cough, hemoptysis, sputum production, shortness of breath and wheezing.   Cardiovascular:  Negative for chest pain, palpitations, orthopnea and claudication.  Gastrointestinal:  Negative for abdominal pain, blood in stool, constipation, diarrhea, heartburn, melena, nausea and vomiting.  Genitourinary:  Negative for dysuria, flank pain, frequency, hematuria and  urgency.  Musculoskeletal:  Negative for back pain, joint pain and myalgias.  Skin:  Negative for rash.  Neurological:  Positive for sensory change (peripheral neuropathy). Negative for dizziness, tingling, focal  weakness, seizures, weakness and headaches.  Endo/Heme/Allergies:  Does not bruise/bleed easily.  Psychiatric/Behavioral:  Negative for depression and suicidal ideas. The patient does not have insomnia.       Allergies  Allergen Reactions   Morphine Nausea And Vomiting and Other (See Comments)    migranes Other reaction(s): Headache Migraine, vomiting   Sertraline Hcl     REACTION: Worsened symptoms of IBS   Sulfa Antibiotics Rash   Sulfamethoxazole Rash   Sulfonamide Derivatives Rash     Past Medical History:  Diagnosis Date   Anxiety    Breast cancer ([place])    Diverticulitis    Family history of ovarian cancer    GERD (gastroesophageal reflux disease)    IBS (irritable bowel syndrome)    PONV (postoperative nausea and vomiting)    severe migraine and vomiting post anesthesia   Scoliosis      Past Surgical History:  Procedure Laterality Date   ABDOMINAL HYSTERECTOMY     still has ovaries, no gyn cancer, hysterectomy due to endometriosis. NO cervix on exam 01/10/21   BREAST BIOPSY Left 09/15/2020   [place] bx of mass, path pending, Q marker   BREAST BIOPSY Left 09/15/2020   [place] bx of LN, hydromarker, path pending   BREAST RECONSTRUCTION WITH PLACEMENT OF TISSUE EXPANDER AND FLEX HD (ACELLULAR HYDRATED DERMIS) Bilateral 03/17/2021   Procedure: IMMEDIATE BILATERAL BREAST RECONSTRUCTION WITH PLACEMENT OF TISSUE EXPANDER AND FLEX HD (ACELLULAR HYDRATED DERMIS);  Surgeon: Wallace Going, DO;  Location: [place];  Service: Plastics;  Laterality: Bilateral;   COLONOSCOPY WITH PROPOFOL N/A 11/21/2021   Procedure: COLONOSCOPY WITH PROPOFOL;  Surgeon: Lin Landsman, MD;  Location: [facility] ENDOSCOPY;  Service: Gastroenterology;  Laterality: N/A;   IR IMAGING GUIDED PORT INSERTION  10/01/2020   MODIFIED MASTECTOMY Left 03/17/2021   Procedure: LEFT MODIFIED RADICAL MASTECTOMY;  Surgeon: Erroll Luna, MD;  Location: [place];  Service: General;   Laterality: Left;   PORTA CATH REMOVAL Right 03/17/2021   Procedure: PORTA CATH REMOVAL;  Surgeon: Erroll Luna, MD;  Location: [place];  Service: General;  Laterality: Right;   REMOVAL OF BILATERAL TISSUE EXPANDERS WITH PLACEMENT OF BILATERAL BREAST IMPLANTS Bilateral 05/09/2021   Procedure: REMOVAL OF BILATERAL TISSUE EXPANDERS WITH PLACEMENT OF BILATERAL BREAST IMPLANTS;  Surgeon: Wallace Going, DO;  Location: [place];  Service: Plastics;  Laterality: Bilateral;  90 min   TOTAL MASTECTOMY Right 03/17/2021   Procedure: TOTAL MASTECTOMY;  Surgeon: Erroll Luna, MD;  Location: [place];  Service: General;  Laterality: Right;    Social History   Socioeconomic History   Marital status: Divorced    Spouse name: Not on file   Number of children: 2   Years of education: Not on file   Highest education level: Not on file  Occupational History   Occupation: Nurse    Employer: [employer]  Tobacco Use   Smoking status: Every Day    Packs/day: 0.25    Types: Cigarettes   Smokeless tobacco: Never   Tobacco comments:    Pt trying to quit now. Has reduced amount significantly  Vaping Use   Vaping Use: Never used  Substance and Sexual Activity   Alcohol use: Not Currently   Drug use: No  Sexual activity: Not Currently    Birth control/protection: None  Other Topics Concern   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 She and her spouse are primary caregivers.    Social Determinants of Health   Financial Resource Strain: Not on file  Food Insecurity: Not on file  Transportation Needs: Not on file  Physical Activity: Not on file  Stress: Not on file  Social Connections: Not on file  Intimate Partner Violence: Not on file    Family History  Problem Relation Age of Onset   Hypertension Mother    Osteoarthritis Mother     Diverticulitis Mother    Heart failure Father    Hypertension Father    Gout Father    Heart attack Father 62   Diverticulitis Brother    Ovarian cancer Paternal Grandmother      Current Outpatient Medications:    anastrozole (ARIMIDEX) 1 MG tablet, Take 1 tablet (1 mg total) by mouth daily., Disp: 30 tablet, Rfl: 3   CALCIUM-VITAMIN D PO, Take 1 tablet by mouth daily at 12 noon., Disp: , Rfl:    cyanocobalamin (,VITAMIN B-12,) 1000 MCG/ML injection, 1000 mcg (1 mL) intramuscular injection in the thigh ( vastus lateralis) once per month., Disp: 3 mL, Rfl: 4   FLUoxetine (PROZAC) 20 MG capsule, Take 1 capsule (20 mg total) by mouth every morning., Disp: 90 capsule, Rfl: 3   Lactobacillus (PROBIOTIC ACIDOPHILUS PO), Take 1 capsule by mouth daily., Disp: , Rfl:    LORazepam (ATIVAN) 0.5 MG tablet, Take 1 tablet (0.5 mg total) by mouth at bedtime as needed for up to 30 doses for anxiety., Disp: 30 tablet, Rfl: 0   Multiple Vitamins-Minerals (MULTIVITAL PO), Take 1 Dose by mouth daily., Disp: , Rfl:    nicotine (NICODERM CQ - DOSED IN MG/24 HOURS) 21 mg/24hr patch, use 1 patch daily, Disp: 28 patch, Rfl: 0   ondansetron (ZOFRAN-ODT) 8 MG disintegrating tablet, Take 1 tablet (8 mg total) by mouth every 8 (eight) hours as needed for nausea or vomiting., Disp: 45 tablet, Rfl: 2   Oxycodone HCl 10 MG TABS, Take 1 tablet (10 mg total) by mouth as directed., Disp: 30 tablet, Rfl: 0   pregabalin (LYRICA) 150 MG capsule, Take 1 capsule (150 mg total) by mouth in the morning, at noon, and at bedtime., Disp: 90 capsule, Rfl: 2   rosuvastatin (CRESTOR) 5 MG tablet, Take 1 tablet (5 mg total) by mouth every evening., Disp: 90 tablet, Rfl: 3   trimethoprim (TRIMPEX) 100 MG tablet, Take 1 tablet (100 mg total) by mouth daily., Disp: 90 tablet, Rfl: 3   VITAMIN D PO, Take 2,000 Int'l Units/day by mouth daily at 12 noon., Disp: , Rfl:    abemaciclib (VERZENIO) 100 MG tablet, Take 1 tablet (100 mg total) by mouth  2 (two) times daily., Disp: 56 tablet, Rfl: 1   prochlorperazine (COMPAZINE) 10 MG tablet, Take 1 tablet (10 mg total) by mouth every 6 (six) hours as needed for nausea or vomiting. (Patient not taking: Reported on 06/07/2022), Disp: 30 tablet, Rfl: 2   tiZANidine (ZANAFLEX) 4 MG tablet, Take 1 tablet (4 mg total) by mouth 3 (three) times daily. (Patient not taking: Reported on 06/28/2022), Disp: 30 tablet, Rfl: 0 No current facility-administered medications for this visit.  Facility-Administered Medications Ordered in Other Visits:    acetaminophen (TYLENOL) 325 MG tablet, , , ,  diphenhydrAMINE (BENADRYL) 25 mg capsule, , , ,    goserelin (ZOLADEX) injection 3.6 mg, 3.6 mg, Subcutaneous, Q28 days, Randa Evens C, MD, 3.6 mg at 05/04/21 1150   heparin lock flush 100 unit/mL, 500 Units, Intravenous, Once, Sindy Guadeloupe, MD   Zoledronic Acid (ZOMETA) 4 MG/100ML IVPB, , , ,   Physical exam:  Vitals:   06/28/22 1512  BP: 104/72  Pulse: 73  Resp: 16  Temp: (!) 96.5 F (35.8 C)  SpO2: 98%  Weight: 113 lb 9.6 oz (51.5 kg)   Physical Exam Constitutional:      General: She is not in acute distress. Cardiovascular:     Rate and Rhythm: Normal rate and regular rhythm.     Heart sounds: Normal heart sounds.  Pulmonary:     Effort: Pulmonary effort is normal.     Breath sounds: Normal breath sounds.  Abdominal:     General: Bowel sounds are normal.     Palpations: Abdomen is soft.  Skin:    General: Skin is warm and dry.  Neurological:     Mental Status: She is alert and oriented to person, place, and time.   Chest wall exam: Patient is s/p bilateral mastectomy with reconstruction.  No evidence of chest wall recurrence.  No palpable bilateral axillary adenopathy.     Latest Ref Rng & Units 06/28/2022    2:58 PM  CMP  Glucose 70 - 99 mg/dL 115   BUN 6 - 20 mg/dL 10   Creatinine 0.44 - 1.00 mg/dL 1.05   Sodium 135 - 145 mmol/L 138   Potassium 3.5 - 5.1 mmol/L 3.8   Chloride 98 -  111 mmol/L 104   CO2 22 - 32 mmol/L 26   Calcium 8.9 - 10.3 mg/dL 9.5   Total Protein 6.5 - 8.1 g/dL 7.0   Total Bilirubin 0.3 - 1.2 mg/dL 0.3   Alkaline Phos 38 - 126 U/L 61   AST 15 - 41 U/L 20   ALT 0 - 44 U/L 20       Latest Ref Rng & Units 06/28/2022    2:58 PM  CBC  WBC 4.0 - 10.5 K/uL 6.1   Hemoglobin 12.0 - 15.0 g/dL 11.7   Hematocrit 36.0 - 46.0 % 35.5   Platelets 150 - 400 K/uL 176       DG Cervical Spine With Flex & Extend  Result Date: 06/07/2022 CLINICAL DATA:  Chronic neck pain and back pain. EXAM: CERVICAL SPINE COMPLETE WITH FLEXION AND EXTENSION VIEWS COMPARISON:  Soft tissue neck CT, 05/09/2012. FINDINGS: No fracture, bone lesion or spondylolisthesis. Moderate loss of disc height at C6-C7 anterior endplate osteophytes. Remaining cervical discs spaces are well preserved. Mild neural foraminal narrowing bilaterally at C6-C7 due to uncovertebral spurring. Facet spurring noted the left, C3-C4 through C5-C6, with mild neural foraminal narrowing on the left at C5-C6. No subluxation noted flexion and extension views. Soft tissues are unremarkable. IMPRESSION: 1. No fracture or acute finding.  No spondylolisthesis. 2. No subluxation with flexion or extension. 3. Disc and facet degenerative changes as detailed, findings most notable at C6-C7. C6-C7 degenerative changes and facet degenerative changes have progressed since the CT scan of the neck from 05/09/2012. Electronically Signed   By: Lajean Manes M.D.   On: 06/07/2022 16:15   DG Thoracic Spine W/Swimmers  Result Date: 06/07/2022 CLINICAL DATA:  Chronic back pain. History of scoliosis. History of breast carcinoma. EXAM: THORACIC SPINE - 3 VIEWS COMPARISON:  PET-CT, 04/18/2021.  FINDINGS: Moderate dextroscoliosis, apex in the mid thoracic spine. No fracture or bone lesion.  No spondylolisthesis. Minor disc degenerative changes along the mid to lower thoracic spine, stable. Soft tissues are unremarkable. IMPRESSION: 1. No fracture  or acute finding.  No spondylolisthesis. 2. Moderate dextroscoliosis.  Minor disc degenerative changes. Electronically Signed   By: Lajean Manes M.D.   On: 06/07/2022 16:10   DG Lumbar Spine Complete W/Bend  Result Date: 06/07/2022 CLINICAL DATA:  Back pain.  History of scoliosis. EXAM: LUMBAR SPINE - COMPLETE WITH BENDING VIEWS COMPARISON:  10/06/2020. FINDINGS: Marked levoscoliosis, with a rotary component, apex L2, stable from prior study. No fracture, bone lesion or spondylolisthesis. Mild to moderate loss of disc height at L2-L3 and L3-L4. Mild loss of disc height at L4-L5. Facet degenerative change on the left, L2-L3 through L5-S1 and on the right at L4-L5 and L5-S1. No subluxation on flexion or extension diffuse. Mild scattered aortic atherosclerotic calcifications. IMPRESSION: 1. No fracture or acute finding.  No spondylolisthesis. 2. Marked levoscoliosis with a rotary component. This is stable from prior exam. 3. Disc and facet degenerative changes as detailed. Disc degenerative changes are similar to the prior exam. Electronically Signed   By: Lajean Manes M.D.   On: 06/07/2022 16:08     Assessment and plan- Patient is a 51 y.o. female who is here for follow-up of following issues:  Stage III left breast cancer: She has completed neoadjuvant chemotherapy followed by surgery and adjuvant Herceptin and Perjeta.  She had mixed HER2 positive and HER2 negative disease.  For ER/PR positive HER2 negative high risk breast cancer she is presently on Verzenio which she will continue for 2 years.  She is presently at a lower dose of 100 mg twice daily and we discussed her ongoing fatigue and nausea and if she would like to decrease it to 50 mg twice daily.  Patient prefers to keep it at the present dose.Patient has been off ovarian suppression and we will be checking her repeat hormone level sometime around November.  She will continue Arimidex at this time  Patient is receiving Zometa when I will in  November 2023 her with labs.  Chemo-induced peripheral neuropathy: Continue as needed oxycodone.  She is following up with pain clinic and until they decide what to do with her pain regimenShe will remain on oxycodone along with Lyrica.  She also has known chemo related back pain which is being followed by pain clinic.  Interim labs in 4 weeks and see Roosvelt Harps   Visit Diagnosis 1. Malignant neoplasm of upper-outer quadrant of left breast in female, estrogen receptor positive ([place])   2. High risk medication use   3. Chemotherapy-induced peripheral neuropathy ([place])      Dr. Randa Evens, MD, MPH [facility] [phone] 07/02/2022 10:25 AM

## 2022-07-04 NOTE — Progress Notes (Unsigned)
PROVIDER NOTE: Information contained herein reflects review and annotations entered in association with encounter. Interpretation of such information and data should be left to medically-trained personnel. Information provided to patient can be located elsewhere in the medical record under "Patient Instructions". Document created using STT-dictation technology, any transcriptional errors that may result from process are unintentional.    Patient: Tricia Berry  Service Category: E/M  Provider: Gaspar Cola, MD  DOB: 1971-02-24  DOS: 07/05/2022  Referring Provider: Burnard Hawthorne, FNP  MRN: [id number]  Specialty: Interventional Pain Management  PCP: Burnard Hawthorne, FNP  Type: Established Patient  Setting: Ambulatory outpatient    Location: Office  Delivery: Face-to-face     Primary Reason(s) for Visit: Encounter for evaluation before starting new chronic pain management plan of care (Level of risk: moderate) CC: No chief complaint on file.  HPI  Tricia Berry is a 51 y.o. year old, female patient, who comes today for a follow-up evaluation to review the test results and decide on a treatment plan. She has Anxiety state; TOBACCO ABUSE; INSOMNIA, CHRONIC; DEPRESSION, RECURRENT; INTERNAL HEMORRHOIDS; ALLERGIC RHINITIS, SEASONAL; IBS; Anal fissure; OSTEOARTHRITIS, HANDS, BILATERAL; Arthralgia; BACK STRAIN, LUMBAR; Encounter for medical examination to establish care; Malignant neoplasm of upper-outer quadrant of left breast in female, estrogen receptor positive ([place]); Chronic low back pain (1ry area of Pain) (Bilateral) (R>L) w/o sciatica; Candidal vulvovaginitis; Family history of ovarian cancer; Genetic testing; 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[place]); Atherosclerosis of aorta ([place]); Hepatic steatosis; B12 deficiency; Acquired absence of breast; Family history of heart disease; Chronic pain syndrome; Pharmacologic therapy; Disorder of skeletal system; Problems influencing health status; Pain  medication agreement signed (12/07/21); History of breast cancer (Left); History of mastectomy (Bilateral); Chronic calf pain (2ry area of Pain) (Bilateral); Chronic lower extremity pain (3ry area of Pain) (Bilateral); Chronic feet pain (Bilateral); Chronic upper extremity pain (4th area of Pain) (Bilateral) (L>R); Chronic hand pain (Bilateral) (L>R); Chronic generalized pain (5th area of Pain); and Abnormal MRI, lumbar spine (10/13/2021) on their problem list. Her primarily concern today is the No chief complaint on file.  Pain Assessment: Location:     Radiating:   Onset:   Duration:   Quality:   Severity:  /10 (subjective, self-reported pain score)  Effect on ADL:   Timing:   Modifying factors:   BP:    HR:    Tricia Berry comes in today for a follow-up visit after her initial evaluation on 06/19/2022. Today we went over the results of her tests. These were explained in "Layman's terms". During today's appointment we went over my diagnostic impression, as well as the proposed treatment plan.  ***  In considering the treatment plan options, [name] was reminded that I no longer take patients for medication management only. I asked her to let me know if she had no intention of taking advantage of the interventional therapies, so that we could make arrangements to provide this space to someone interested. I also made it clear that undergoing interventional therapies for the purpose of getting pain medications is very inappropriate on the part of a patient, and it will not be tolerated in this practice. This type of behavior would suggest true addiction and therefore it requires referral to an addiction specialist.   Further details on both, my assessment(s), as well as the proposed treatment plan, please see below.  Controlled Substance Pharmacotherapy Assessment REMS (Risk Evaluation and Mitigation Strategy)  Opioid Analgesic: Oxycodone IR 10 mg tablet, 1 tab p.o. daily (# 30) (last  filled on 05/19/2022) MME/day: 15 mg/day  Pill Count: None expected due to no prior prescriptions written by our practice. No notes on file Pharmacokinetics: Liberation and absorption (onset of action): WNL Distribution (time to peak effect): WNL Metabolism and excretion (duration of action): WNL         Pharmacodynamics: Desired effects: Analgesia: Ms. Thoen reports >50% benefit. Functional ability: Patient reports that medication allows her to accomplish basic ADLs Clinically meaningful improvement in function (CMIF): Sustained CMIF goals met Perceived effectiveness: Described as relatively effective, allowing for increase in activities of daily living (ADL) Undesirable effects: Side-effects or Adverse reactions: None reported Monitoring: [place] PMP: PDMP reviewed during this encounter. Online review of the past 20-monthperiod previously conducted. Not applicable at this point since we have not taken over the patient's medication management yet. List of other Serum/Urine Drug Screening Test(s):  No results found for: "AMPHSCRSER", "BARBSCRSER", "BENZOSCRSER", "COCAINSCRSER", "COCAINSCRNUR", "PCPSCRSER", "THCSCRSER", "THCU", "CANNABQUANT", "OPIATESCRSER", "OXYSCRSER", "PROPOXSCRSER", "ETH", "CBDTHCR", "D8THCCBX", "D9THCCBX" List of all UDS test(s) done:  Lab Results  Component Value Date   SUMMARY Note 05/29/2022   Last UDS on record: Summary  Date Value Ref Range Status  05/29/2022 Note  Final    Comment:    ==================================================================== Compliance Drug Analysis, Ur ==================================================================== Test                             Result       Flag       Units  Drug Present and Declared for Prescription Verification   Lorazepam                      1231         EXPECTED   ng/mg creat    Source of lorazepam is a scheduled prescription medication.    Oxycodone                      1391         EXPECTED    ng/mg creat   Noroxycodone                   5467         EXPECTED   ng/mg creat    Sources of oxycodone include scheduled prescription medications.    Noroxycodone is an expected metabolite of oxycodone.    Pregabalin                     PRESENT      EXPECTED   Fluoxetine                     PRESENT      EXPECTED   Norfluoxetine                  PRESENT      EXPECTED    Norfluoxetine is an expected metabolite of fluoxetine.  Drug Present not Declared for Prescription Verification   Oxazepam                       376          UNEXPECTED ng/mg creat   Temazepam                      41           UNEXPECTED ng/mg creat    Oxazepam and  temazepam are expected metabolites of diazepam.    Oxazepam is also an expected metabolite of other benzodiazepine    drugs, including chlordiazepoxide, prazepam, clorazepate, halazepam,    and temazepam.  Oxazepam and temazepam are available as scheduled    prescription medications.    Acetaminophen                  PRESENT      UNEXPECTED  Drug Absent but Declared for Prescription Verification   Tizanidine                     Not Detected UNEXPECTED    Tizanidine, as indicated in the declared medication list, is not    always detected even when used as directed.    Prochlorperazine               Not Detected UNEXPECTED   Lidocaine                      Not Detected UNEXPECTED    Lidocaine, as indicated in the declared medication list, is not    always detected even when used as directed.  ==================================================================== Test                      Result    Flag   Units      Ref Range   Creatinine              54               mg/dL      >=20 ==================================================================== Declared Medications:  The flagging and interpretation on this report are based on the  following declared medications.  Unexpected results may arise from  inaccuracies in the declared medications.   **Note:  The testing scope of this panel includes these medications:   Fluoxetine (Prozac)  Lorazepam (Ativan)  Oxycodone  Pregabalin (Lyrica)  Prochlorperazine (Compazine)   **Note: The testing scope of this panel does not include small to  moderate amounts of these reported medications:   Tizanidine (Zanaflex)  Topical Lidocaine (EMLA)   **Note: The testing scope of this panel does not include the  following reported medications:   Abemaciclib (Verzenio)  Anastrozole (Arimidex)  Calcium  Multivitamin  Nicotine  Ondansetron (Zofran)  Prilocaine (EMLA)  Probiotic  Rosuvastatin (Crestor)  Supplement  Trimethoprim  Vitamin B12  Vitamin D ==================================================================== For clinical consultation, please call [phone]. ====================================================================    UDS interpretation: No unexpected findings.          Medication Assessment Form: Not applicable. No opioids. Treatment compliance: Not applicable Risk Assessment Profile: Aberrant behavior: See initial evaluations. None observed or detected today Comorbid factors increasing risk of overdose: See initial evaluation. No additional risks detected today Opioid risk tool (ORT):     05/29/2022   11:25 AM  Opioid Risk   Alcohol 0  Illegal Drugs 0  Rx Drugs 0  Alcohol 0  Illegal Drugs 0  Rx Drugs 0  Age between 16-45 years  0  History of Preadolescent Sexual Abuse 0  Psychological Disease 2  Opioid Risk Tool Scoring 2  Opioid Risk Interpretation Low Risk    ORT Scoring interpretation table:  Score <3 = Low Risk for SUD  Score between 4-7 = Moderate Risk for SUD  Score >8 = High Risk for Opioid Abuse   Risk of substance use disorder (SUD): Low  Risk Mitigation Strategies:  Patient opioid safety  counseling: No controlled substances prescribed. Patient-Prescriber Agreement (PPA): No agreement signed.  Controlled substance notification to other  providers: None required. No opioid therapy.  Pharmacologic Plan: Non-opioid analgesic therapy offered. Interventional alternatives discussed.             Laboratory Chemistry Profile   Renal Lab Results  Component Value Date   BUN 10 06/28/2022   CREATININE 1.05 (H) 06/28/2022   GFR 66.76 12/29/2021   GFRAA >90 05/09/2012   GFRNONAA >60 06/28/2022   SPECGRAV 1.010 12/13/2021   PHUR 7.0 12/13/2021   PROTEINUR Negative 12/13/2021     Electrolytes Lab Results  Component Value Date   NA 138 06/28/2022   K 3.8 06/28/2022   CL 104 06/28/2022   CALCIUM 9.5 06/28/2022   MG 1.8 05/23/2022     Hepatic Lab Results  Component Value Date   AST 20 06/28/2022   ALT 20 06/28/2022   ALBUMIN 3.7 06/28/2022   ALKPHOS 61 06/28/2022     ID No results found for: "LYMEIGGIGMAB", "HIV", "SARSCOV2NAA", "STAPHAUREUS", "MRSAPCR", "HCVAB", "PREGTESTUR", "RMSFIGG", "QFVRPH1IGG", "QFVRPH2IGG"   Bone Lab Results  Component Value Date   25OHVITD1 38 05/29/2022   25OHVITD2 1.2 05/29/2022   25OHVITD3 37 05/29/2022     Endocrine Lab Results  Component Value Date   GLUCOSE 115 (H) 06/28/2022   GLUCOSEU Negative 12/13/2021   TSH 1.44 01/21/2021   FREET4 1.12 01/21/2021     Neuropathy Lab Results  Component Value Date   VITAMINB12 176 (L) 11/16/2021   FOLATE 12.1 11/16/2021     CNS No results found for: "COLORCSF", "APPEARCSF", "RBCCOUNTCSF", "WBCCSF", "POLYSCSF", "LYMPHSCSF", "EOSCSF", "PROTEINCSF", "GLUCCSF", "JCVIRUS", "CSFOLI", "IGGCSF", "LABACHR", "ACETBL"   Inflammation (CRP: Acute  ESR: Chronic) Lab Results  Component Value Date   CRP 5 05/29/2022   ESRSEDRATE CANCELED 05/29/2022     Rheumatology Lab Results  Component Value Date   RF <14 01/20/2022   ANA NEGATIVE 01/20/2022     Coagulation Lab Results  Component Value Date   INR 1.1 10/01/2020   LABPROT 13.6 10/01/2020   PLT 176 06/28/2022     Cardiovascular Lab Results  Component Value Date   HGB 11.7 (L)  06/28/2022   HCT 35.5 (L) 06/28/2022     Screening No results found for: "SARSCOV2NAA", "COVIDSOURCE", "STAPHAUREUS", "MRSAPCR", "HCVAB", "HIV", "PREGTESTUR"   Cancer No results found for: "CEA", "CA125", "LABCA2"   Allergens No results found for: "ALMOND", "APPLE", "ASPARAGUS", "AVOCADO", "BANANA", "BARLEY", "BASIL", "BAYLEAF", "GREENBEAN", "LIMABEAN", "WHITEBEAN", "BEEFIGE", "REDBEET", "BLUEBERRY", "BROCCOLI", "CABBAGE", "MELON", "CARROT", "CASEIN", "CASHEWNUT", "CAULIFLOWER", "CELERY"     Note: Lab results reviewed.  Recent Diagnostic Imaging Review  Cervical Imaging: Cervical MR wo contrast: No results found for this or any previous visit.  Cervical MR wo contrast: No valid procedures specified. Cervical CT wo contrast: No results found for this or any previous visit.  Cervical DG Bending/F/E views: Results for orders placed during the hospital encounter of 06/06/22  DG Cervical Spine With Flex & Extend  Narrative CLINICAL DATA:  Chronic neck pain and back pain.  EXAM: CERVICAL SPINE COMPLETE WITH FLEXION AND EXTENSION VIEWS  COMPARISON:  Soft tissue neck CT, 05/09/2012.  FINDINGS: No fracture, bone lesion or spondylolisthesis.  Moderate loss of disc height at C6-C7 anterior endplate osteophytes. Remaining cervical discs spaces are well preserved.  Mild neural foraminal narrowing bilaterally at C6-C7 due to uncovertebral spurring. Facet spurring noted the left, C3-C4 through C5-C6, with mild neural foraminal narrowing on the left at C5-C6.  No subluxation noted flexion  and extension views.  Soft tissues are unremarkable.  IMPRESSION: 1. No fracture or acute finding.  No spondylolisthesis. 2. No subluxation with flexion or extension. 3. Disc and facet degenerative changes as detailed, findings most notable at C6-C7. C6-C7 degenerative changes and facet degenerative changes have progressed since the CT scan of the neck from 05/09/2012.   Electronically  Signed By: Lajean Manes M.D. On: 06/07/2022 16:15   Shoulder Imaging: Shoulder-R MR wo contrast: No results found for this or any previous visit.  Shoulder-L MR wo contrast: No results found for this or any previous visit.  Shoulder-R DG: No results found for this or any previous visit.  Shoulder-L DG: No results found for this or any previous visit.   Thoracic Imaging: Thoracic MR wo contrast: No results found for this or any previous visit.  Thoracic MR wo contrast: No valid procedures specified. Thoracic CT wo contrast: No results found for this or any previous visit.  Thoracic DG 4 views: No results found for this or any previous visit.  Thoracic DG w/swimmers view: Results for orders placed during the hospital encounter of 06/06/22  [facility] Thoracic Spine W/Swimmers  Narrative CLINICAL DATA:  Chronic back pain. History of scoliosis. History of breast carcinoma.  EXAM: THORACIC SPINE - 3 VIEWS  COMPARISON:  PET-CT, 04/18/2021.  FINDINGS: Moderate dextroscoliosis, apex in the mid thoracic spine.  No fracture or bone lesion.  No spondylolisthesis.  Minor disc degenerative changes along the mid to lower thoracic spine, stable.  Soft tissues are unremarkable.  IMPRESSION: 1. No fracture or acute finding.  No spondylolisthesis. 2. Moderate dextroscoliosis.  Minor disc degenerative changes.   Electronically Signed By: Lajean Manes M.D. On: 06/07/2022 16:10   Lumbosacral Imaging: Lumbar MR wo contrast: Results for orders placed during the hospital encounter of 10/13/21  MR LUMBAR SPINE WO CONTRAST  Narrative CLINICAL DATA:  Low back pain and right greater than left buttock pain, worsening over the last few months  EXAM: MRI LUMBAR SPINE WITHOUT CONTRAST  TECHNIQUE: Multiplanar, multisequence MR imaging of the lumbar spine was performed. No intravenous contrast was administered.  COMPARISON:  CT scan 07/18/2021  FINDINGS: Segmentation: The lowest  lumbar type non-rib-bearing vertebra is labeled as L5.  Alignment: Levoconvex lumbar scoliosis with substantial rotary component. No subluxation.  Vertebrae: Mild type 1 degenerative endplate findings along the inferior endplate of L2 eccentric to the right. Disc desiccation at L3-4 and L5-S1 with mild loss of disc height at L5-S1. Mild degenerative facet edema on the right at L3-4  Conus medullaris and cauda equina: Conus extends to the upper L2 level. Conus and cauda equina appear normal.  Paraspinal and other soft tissues: Unremarkable  Disc levels:  L1-2: Unremarkable.  L2-3: Unremarkable.  L3-4: Mild right and borderline left foraminal stenosis due to disc bulge and bilateral degenerative facet arthropathy. Small right facet joint effusion.  L4-5: Borderline left foraminal stenosis due to degenerative facet arthropathy and mild disc bulge. Borderline left subarticular lateral recess stenosis.  L5-S1: Borderline left foraminal stenosis and borderline bilateral subarticular lateral recess stenosis due to disc bulge, facet arthropathy, and a small right lateral recess disc protrusion.  IMPRESSION: 1. Lumbar spondylosis, scoliosis, and degenerative disc disease, resulting in mild impingement at L3-4 and borderline impingement at L4-5 and L5-S1. 2. Substantial levoconvex lumbar scoliosis with rotary component. 3. Small right facet joint effusion the L3-4 level.   Electronically Signed By: Van Clines M.D. On: 10/13/2021 15:53  Lumbar MR wo contrast: No valid procedures  specified. Lumbar CT wo contrast: No results found for this or any previous visit.  Lumbar DG Bending views: Results for orders placed during the hospital encounter of 06/06/22  DG Lumbar Spine Complete W/Bend  Narrative CLINICAL DATA:  Back pain.  History of scoliosis.  EXAM: LUMBAR SPINE - COMPLETE WITH BENDING VIEWS  COMPARISON:  10/06/2020.  FINDINGS: Marked levoscoliosis, with a  rotary component, apex L2, stable from prior study.  No fracture, bone lesion or spondylolisthesis.  Mild to moderate loss of disc height at L2-L3 and L3-L4. Mild loss of disc height at L4-L5. Facet degenerative change on the left, L2-L3 through L5-S1 and on the right at L4-L5 and L5-S1.  No subluxation on flexion or extension diffuse.  Mild scattered aortic atherosclerotic calcifications.  IMPRESSION: 1. No fracture or acute finding.  No spondylolisthesis. 2. Marked levoscoliosis with a rotary component. This is stable from prior exam. 3. Disc and facet degenerative changes as detailed. Disc degenerative changes are similar to the prior exam.   Electronically Signed By: Lajean Manes M.D. On: 06/07/2022 16:08         Sacroiliac Joint Imaging: Sacroiliac Joint DG: No results found for this or any previous visit.   Hip Imaging: Hip-R MR wo contrast: No results found for this or any previous visit.  Hip-L MR wo contrast: No results found for this or any previous visit.  Hip-R CT wo contrast: No results found for this or any previous visit.  Hip-L CT wo contrast: No results found for this or any previous visit.  Hip-R DG 2-3 views: Results for orders placed in visit on 08/05/21  DG Hip Unilat W OR W/O Pelvis 2-3 Views Right  Narrative CLINICAL DATA:  Right-sided hip pain  EXAM: DG HIP (WITH OR WITHOUT PELVIS) 2-3V RIGHT  COMPARISON:  CT 07/18/2021  FINDINGS: There is no evidence of hip fracture or dislocation. There is no evidence of arthropathy or other focal bone abnormality.  IMPRESSION: Negative.   Electronically Signed By: Donavan Foil M.D. On: 08/08/2021 22:54  Hip-L DG 2-3 views: No results found for this or any previous visit.  Hip-B DG Bilateral: No results found for this or any previous visit.   Knee Imaging: Knee-R MR wo contrast: No results found for this or any previous visit.  Knee-L MR wo contrast: No results found for this or any  previous visit.  Knee-R CT wo contrast: No results found for this or any previous visit.  Knee-L CT wo contrast: No results found for this or any previous visit.  Knee-R DG 4 views: No results found for this or any previous visit.  Knee-L DG 4 views: No results found for this or any previous visit.   Ankle Imaging: Ankle-R DG Complete: No results found for this or any previous visit.  Ankle-L DG Complete: No results found for this or any previous visit.   Foot Imaging: Foot-R DG Complete: No results found for this or any previous visit.  Foot-L DG Complete: No results found for this or any previous visit.   Elbow Imaging: Elbow-R DG Complete: No results found for this or any previous visit.  Elbow-L DG Complete: No results found for this or any previous visit.   Wrist Imaging: Wrist-R DG Complete: No results found for this or any previous visit.  Wrist-L DG Complete: No results found for this or any previous visit.   Hand Imaging: Hand-R DG Complete: No results found for this or any previous visit.  Hand-L DG  Complete: No results found for this or any previous visit.   Complexity Note: Imaging results reviewed.                         Meds   Current Outpatient Medications:    abemaciclib (VERZENIO) 100 MG tablet, Take 1 tablet (100 mg total) by mouth 2 (two) times daily., Disp: 56 tablet, Rfl: 1   anastrozole (ARIMIDEX) 1 MG tablet, Take 1 tablet (1 mg total) by mouth daily., Disp: 30 tablet, Rfl: 3   CALCIUM-VITAMIN D PO, Take 1 tablet by mouth daily at 12 noon., Disp: , Rfl:    cyanocobalamin (,VITAMIN B-12,) 1000 MCG/ML injection, 1000 mcg (1 mL) intramuscular injection in the thigh ( vastus lateralis) once per month., Disp: 3 mL, Rfl: 4   FLUoxetine (PROZAC) 20 MG capsule, Take 1 capsule (20 mg total) by mouth every morning., Disp: 90 capsule, Rfl: 3   Lactobacillus (PROBIOTIC ACIDOPHILUS PO), Take 1 capsule by mouth daily., Disp: , Rfl:    LORazepam (ATIVAN)  0.5 MG tablet, Take 1 tablet (0.5 mg total) by mouth at bedtime as needed for up to 30 doses for anxiety., Disp: 30 tablet, Rfl: 0   Multiple Vitamins-Minerals (MULTIVITAL PO), Take 1 Dose by mouth daily., Disp: , Rfl:    nicotine (NICODERM CQ - DOSED IN MG/24 HOURS) 21 mg/24hr patch, use 1 patch daily, Disp: 28 patch, Rfl: 0   ondansetron (ZOFRAN-ODT) 8 MG disintegrating tablet, Take 1 tablet (8 mg total) by mouth every 8 (eight) hours as needed for nausea or vomiting., Disp: 45 tablet, Rfl: 2   Oxycodone HCl 10 MG TABS, Take 1 tablet (10 mg total) by mouth as directed., Disp: 30 tablet, Rfl: 0   pregabalin (LYRICA) 150 MG capsule, Take 1 capsule (150 mg total) by mouth in the morning, at noon, and at bedtime., Disp: 90 capsule, Rfl: 2   prochlorperazine (COMPAZINE) 10 MG tablet, Take 1 tablet (10 mg total) by mouth every 6 (six) hours as needed for nausea or vomiting. (Patient not taking: Reported on 06/07/2022), Disp: 30 tablet, Rfl: 2   rosuvastatin (CRESTOR) 5 MG tablet, Take 1 tablet (5 mg total) by mouth every evening., Disp: 90 tablet, Rfl: 3   tiZANidine (ZANAFLEX) 4 MG tablet, Take 1 tablet (4 mg total) by mouth 3 (three) times daily. (Patient not taking: Reported on 06/28/2022), Disp: 30 tablet, Rfl: 0   trimethoprim (TRIMPEX) 100 MG tablet, Take 1 tablet (100 mg total) by mouth daily., Disp: 90 tablet, Rfl: 3   VITAMIN D PO, Take 2,000 Int'l Units/day by mouth daily at 12 noon., Disp: , Rfl:  No current facility-administered medications for this visit.  Facility-Administered Medications Ordered in Other Visits:    acetaminophen (TYLENOL) 325 MG tablet, , , ,    diphenhydrAMINE (BENADRYL) 25 mg capsule, , , ,    goserelin (ZOLADEX) injection 3.6 mg, 3.6 mg, Subcutaneous, Q28 days, Sindy Guadeloupe, MD, 3.6 mg at 05/04/21 1150   heparin lock flush 100 unit/mL, 500 Units, Intravenous, Once, Sindy Guadeloupe, MD   Zoledronic Acid (ZOMETA) 4 MG/100ML IVPB, , , ,   ROS  Constitutional: Denies any  fever or chills Gastrointestinal: No reported hemesis, hematochezia, vomiting, or acute GI distress Musculoskeletal: Denies any acute onset joint swelling, redness, loss of ROM, or weakness Neurological: No reported episodes of acute onset apraxia, aphasia, dysarthria, agnosia, amnesia, paralysis, loss of coordination, or loss of consciousness  Allergies  Ms. Kafer is  allergic to morphine, sertraline hcl, sulfa antibiotics, sulfamethoxazole, and sulfonamide derivatives.  PFSH  Drug: Ms. Goeden  reports no history of drug use. Alcohol:  reports that she does not currently use alcohol. Tobacco:  reports that she has been smoking cigarettes. She has been smoking an average of .25 packs per day. She has never used smokeless tobacco. Medical:  has a past medical history of Anxiety, Breast cancer ([place]), Diverticulitis, Family history of ovarian cancer, GERD (gastroesophageal reflux disease), IBS (irritable bowel syndrome), PONV (postoperative nausea and vomiting), and Scoliosis. Surgical: Ms. Habermehl  has a past surgical history that includes Abdominal hysterectomy; Breast biopsy (Left, 09/15/2020); Breast biopsy (Left, 09/15/2020); IR IMAGING GUIDED PORT INSERTION (10/01/2020); Modified mastectomy (Left, 03/17/2021); Total mastectomy (Right, 03/17/2021); PORTA CATH REMOVAL (Right, 03/17/2021); Breast reconstruction with placement of tissue expander and flex hd (acellular hydrated dermis) (Bilateral, 03/17/2021); Removal of bilateral tissue expanders with placement of bilateral breast implants (Bilateral, 05/09/2021); and Colonoscopy with propofol (N/A, 11/21/2021). Family: family history includes Diverticulitis in her brother and mother; Gout in her father; Heart attack (age of onset: 71) in her father; Heart failure in her father; Hypertension in her father and mother; Osteoarthritis in her mother; Ovarian cancer in her paternal grandmother.  Constitutional Exam  General appearance: Well nourished, well  developed, and well hydrated. In no apparent acute distress There were no vitals filed for this visit. BMI Assessment: Estimated body mass index is 22.19 kg/m as calculated from the following:   Height as of 06/07/22: 5' (1.524 m).   Weight as of 06/28/22: 113 lb 9.6 oz (51.5 kg).  BMI interpretation table: BMI level Category Range association with higher incidence of chronic pain  <18 kg/m2 Underweight   18.5-24.9 kg/m2 Ideal body weight   25-29.9 kg/m2 Overweight Increased incidence by 20%  30-34.9 kg/m2 Obese (Class I) Increased incidence by 68%  35-39.9 kg/m2 Severe obesity (Class II) Increased incidence by 136%  >40 kg/m2 Extreme obesity (Class III) Increased incidence by 254%   Patient's current BMI Ideal Body weight  There is no height or weight on file to calculate BMI. Ideal body weight: 45.5 kg (100 lb 4.9 oz) Adjusted ideal body weight: 47.9 kg (105 lb 10 oz)   BMI Readings from Last 4 Encounters:  06/28/22 22.19 kg/m  06/07/22 21.37 kg/m  06/02/22 20.12 kg/m  05/29/22 21.29 kg/m   Wt Readings from Last 4 Encounters:  06/28/22 113 lb 9.6 oz (51.5 kg)  06/07/22 109 lb 6.4 oz (49.6 kg)  06/02/22 103 lb (46.7 kg)  05/29/22 109 lb (49.4 kg)    Psych/Mental status: Alert, oriented x 3 (person, place, & time)       Eyes: PERLA Respiratory: No evidence of acute respiratory distress  Assessment & Plan  Primary Diagnosis & Pertinent Problem List: There were no encounter diagnoses.  Visit Diagnosis: No diagnosis found. Problems updated and reviewed during this visit: No problems updated.  Plan of Care  Pharmacotherapy (Medications Ordered): No orders of the defined types were placed in this encounter.  Procedure Orders    No procedure(s) ordered today   Lab Orders  No laboratory test(s) ordered today   Imaging Orders  No imaging studies ordered today   Referral Orders  No referral(s) requested today    Pharmacological management options:  Opioid  Analgesics: I will not be prescribing any opioids at this time Membrane stabilizer: I will not be prescribing any at this time Muscle relaxant: I will not be prescribing any at this time NSAID:  I will not be prescribing any at this time Other analgesic(s): I will not be prescribing any at this time     Interventional Therapies  Risk  Complexity Considerations:   Estimated body mass index is 21.29 kg/m as calculated from the following:   Height as of this encounter: 5' (1.524 m).   Weight as of this encounter: 109 lb (49.4 kg). WNL   Planned  Pending:   Referral to neurology for nerve conduction test (EMG/PNCV) of the lower and upper extremities (Bilateral)    Under consideration:   Diagnostic/therapeutic (Midline) caudal ESI + epidurogram #1  Diagnostic/therapeutic bilateral L3 transforaminal ESI #1  Diagnostic bilateral lumbar facet MBB (to include the L3-4 level) #1    Completed:   None at this time   Completed by other providers:   Therapeutic bilateral L3, L4, and L5 medial branch RFA x1 (02/16/2022) by Sharlet Salina, DO  Diagnostic/therapeutic bilateral L3, L4, and L5 MBB x2 (12/30/2021; 01/17/2022) by Sharlet Salina, DO  Diagnostic/therapeutic right L5-S1 TESI + right S1 TESI x1 (11/29/2021) by Sharlet Salina, DO  Therapeutic sacral trigger point injections x1 (09/30/2021) by Rosalia Hammers, DO    Therapeutic  Palliative (PRN) options:   None established     Provider-requested follow-up: No follow-ups on file. Recent Visits Date Type Provider Dept  05/29/22 Office Visit Milinda Pointer, MD [facility]  Showing recent visits within past 90 days and meeting all other requirements Future Appointments Date Type Provider Dept  07/05/22 Appointment Milinda Pointer, MD [facility]  Showing future appointments within next 90 days and meeting all other requirements  Primary Care Physician: Burnard Hawthorne, FNP Note by: Gaspar Cola,  MD Date: 07/05/2022; Time: 4:38 PM

## 2022-07-05 ENCOUNTER — Encounter: Payer: Self-pay | Admitting: Pain Medicine

## 2022-07-05 ENCOUNTER — Ambulatory Visit: Payer: No Typology Code available for payment source | Attending: Pain Medicine | Admitting: Pain Medicine

## 2022-07-05 ENCOUNTER — Other Ambulatory Visit (HOSPITAL_COMMUNITY): Payer: Self-pay

## 2022-07-05 ENCOUNTER — Other Ambulatory Visit: Payer: Self-pay

## 2022-07-05 VITALS — BP 110/86 | HR 74 | Temp 97.2°F | Resp 16 | Ht 60.0 in | Wt 113.0 lb

## 2022-07-05 DIAGNOSIS — M47819 Spondylosis without myelopathy or radiculopathy, site unspecified: Secondary | ICD-10-CM | POA: Diagnosis present

## 2022-07-05 DIAGNOSIS — Z853 Personal history of malignant neoplasm of breast: Secondary | ICD-10-CM | POA: Insufficient documentation

## 2022-07-05 DIAGNOSIS — M79661 Pain in right lower leg: Secondary | ICD-10-CM | POA: Insufficient documentation

## 2022-07-05 DIAGNOSIS — Z79899 Other long term (current) drug therapy: Secondary | ICD-10-CM | POA: Insufficient documentation

## 2022-07-05 DIAGNOSIS — M47817 Spondylosis without myelopathy or radiculopathy, lumbosacral region: Secondary | ICD-10-CM | POA: Insufficient documentation

## 2022-07-05 DIAGNOSIS — M4156 Other secondary scoliosis, lumbar region: Secondary | ICD-10-CM | POA: Insufficient documentation

## 2022-07-05 DIAGNOSIS — M79605 Pain in left leg: Secondary | ICD-10-CM | POA: Insufficient documentation

## 2022-07-05 DIAGNOSIS — M5416 Radiculopathy, lumbar region: Secondary | ICD-10-CM | POA: Insufficient documentation

## 2022-07-05 DIAGNOSIS — M79672 Pain in left foot: Secondary | ICD-10-CM | POA: Insufficient documentation

## 2022-07-05 DIAGNOSIS — M79602 Pain in left arm: Secondary | ICD-10-CM | POA: Insufficient documentation

## 2022-07-05 DIAGNOSIS — M545 Low back pain, unspecified: Secondary | ICD-10-CM | POA: Insufficient documentation

## 2022-07-05 DIAGNOSIS — M79671 Pain in right foot: Secondary | ICD-10-CM | POA: Insufficient documentation

## 2022-07-05 DIAGNOSIS — R52 Pain, unspecified: Secondary | ICD-10-CM | POA: Diagnosis present

## 2022-07-05 DIAGNOSIS — G620 Drug-induced polyneuropathy: Secondary | ICD-10-CM | POA: Diagnosis present

## 2022-07-05 DIAGNOSIS — Z79891 Long term (current) use of opiate analgesic: Secondary | ICD-10-CM | POA: Diagnosis present

## 2022-07-05 DIAGNOSIS — G8929 Other chronic pain: Secondary | ICD-10-CM | POA: Diagnosis present

## 2022-07-05 DIAGNOSIS — M79662 Pain in left lower leg: Secondary | ICD-10-CM | POA: Diagnosis present

## 2022-07-05 DIAGNOSIS — M4807 Spinal stenosis, lumbosacral region: Secondary | ICD-10-CM | POA: Diagnosis present

## 2022-07-05 DIAGNOSIS — M79604 Pain in right leg: Secondary | ICD-10-CM | POA: Diagnosis present

## 2022-07-05 DIAGNOSIS — T451X5A Adverse effect of antineoplastic and immunosuppressive drugs, initial encounter: Secondary | ICD-10-CM | POA: Diagnosis present

## 2022-07-05 DIAGNOSIS — G894 Chronic pain syndrome: Secondary | ICD-10-CM | POA: Diagnosis present

## 2022-07-05 DIAGNOSIS — M2540 Effusion, unspecified joint: Secondary | ICD-10-CM | POA: Diagnosis present

## 2022-07-05 DIAGNOSIS — R937 Abnormal findings on diagnostic imaging of other parts of musculoskeletal system: Secondary | ICD-10-CM | POA: Diagnosis present

## 2022-07-05 DIAGNOSIS — M79601 Pain in right arm: Secondary | ICD-10-CM | POA: Diagnosis present

## 2022-07-05 MED ORDER — OXYCODONE HCL 10 MG PO TABS
10.0000 mg | ORAL_TABLET | Freq: Every day | ORAL | 0 refills | Status: DC
  Filled 2022-07-19: qty 30, 30d supply, fill #0

## 2022-07-05 MED ORDER — ALPHA-LIPOIC ACID 600 MG PO CAPS
600.0000 mg | ORAL_CAPSULE | Freq: Every day | ORAL | 0 refills | Status: AC
  Filled 2022-07-05 – 2022-07-06 (×2): qty 30, 30d supply, fill #0

## 2022-07-05 NOTE — Patient Instructions (Addendum)
____________________________________________________________________________________________  Pharmacy Shortages of Pain Medication   Introduction Shockingly as it may seem, .  "No U.[place] Supreme Court decision has ever interpreted the Constitution as guaranteeing a right to health care for all Americans." - https://huff.com/  "With respect to human rights, the [place] has no formally codified right to health, nor does it participate in a human rights treaty that specifies a right to health." - Scott J. Schweikart, JD, MBE  Situation By now, most of our patients have had the experience of being told by their pharmacist that they do not have enough medication to cover their prescription. If you have not had this experience, just know that you soon will.  Problem There appears to be a shortage of these medications, either at the national level or locally. This is happening with all pharmacies. When there is not enough medication, patients are offered a partial fill and they are told that they will try to get the rest of the medicine for them at a later time. If they do not have enough for even a partial fill, the pharmacists are telling the patients to call us (the prescribing physicians) to request that we send another prescription to another pharmacy to get the medicine.   This reordering of a controlled substance creates documentation problems where additional paperwork needs to be created to explain why two prescriptions for the same period of time and the same medicine are being prescribed to the same patient. It also creates situations where the last appointment note does not accurately reflect when and what prescriptions were given to a patient. This leads to prescribing errors down the line, in subsequent follow-up visits.   [employer] of Pharmacy ([employer]) Research revealed that Surveyor, quantity .1806 (21  NCAC 46.1806) authorizes pharmacists to the transfer of prescriptions among pharmacies, and it sets forth procedural and recordkeeping requirements for doing so. However, this requires the pharmacist to complete the previously mentioned procedural paperwork to accomplish the transfer. As it turns out, it is much easier for them to have the prescribing physicians do the work.   Possible solutions 1. You can ask your physician to assist you in weaning yourself off these medications. 2. Ask your pharmacy if the medication is in stock, 3 days prior to your refill. 3. If you need a pharmacy change, let us know at your medication management visit. Prescriptions that have already been electronically sent to a pharmacy will not be re-sent to a different pharmacy if your pharmacy of record does not have it in stock. Proper stocking of medication is a pharmacy problem, not a prescriber problem. Work with your pharmacist to solve the problem. 4. Have the [facility] Assembly add a provision to the "STOP ACT" (the law that mandates how controlled substances are prescribed) where there is an exception to the electronic prescribing rule that states that in the event there are shortages of medications the physicians are allowed to use written prescriptions as opposed to electronic ones. This would allow patients to take their prescriptions to a different pharmacy that may have enough medication available to fill the prescription. The problem is that currently there is a law that does not allow for written prescriptions, with the exception of instances where the electronic medical record is down due to technical issues.  5. Have [place] Congress ease the pressure on pharmaceutical companies, allowing them to produce enough quantities of the medication to adequately supply the population. 6. Have pharmacies keep enough  stocks of these medications to cover their client base.  7. Have the [facility] Assembly add  a provision to the "STOP ACT" where they ease the regulations surrounding the transfer of controlled substances between pharmacies, so as to simplify the transfer of supplies. As an alternative, develop a system to allow patients to obtain the remainder of their prescription at another one of their pharmacies or at an associate pharmacy.   How this shortage will affect you.  Understand that this is a pharmacy supply problem, not a prescriber problem. Work with your pharmacy to solve it. The job of the prescriber is to evaluate and monitor the patient for the appropriate indications and use of these medicines. It is not the job of the prescriber to supply the medication or to solve problems with that supply. The responsibility and the choice to obtain the medication resides on the patient. By law, supplying the medication is the job of the pharmacy. It is certainly not the job of the prescriber to solve supply problems.   Due to the above problems we are no longer taking patients to write for their pain medication. Future discussions with your physician may include potentially weaning medications or transitioning to alternatives.  We will be focusing primarily on interventional based pain management. We will continue to evaluate for appropriate indications and we may provide recommendations regarding medication, dose, and schedule, as well as monitoring recommendations, however, we will not be taking over the actual prescribing of these substances. On those patients where we are treating their chronic pain with interventional therapies, exceptions will be considered on a case by case basis. At this time, we will try to continue providing this supplemental service to those patients we have been managing in the past. However, as of August 1st, 2023, we no longer will be sending additional prescriptions to other pharmacies for the purpose of solving their supply problems. Once we send a prescription to a pharmacy,  we will not be resending it again to another pharmacy to cover for their shortages.   What to do. Write as many letters as you can. Recruit the help of family members in writing these letters. Below are some of the places where you can write to make your voice heard. Let them know what the problem is and push them to look for solutions.   Search internet for: "[employer] find your legislators" NoseSwap.is  Search internet for: "[employer] commissioner complaints" Starlas.fi  Search internet for: "[place] complaints" https://www.hernandez-brewer.com/.htm  Search internet for: "[facility] pharmacy complaints" Email [facility] Customer Relations woondaal.com.jsp?callType=store  Search internet for: Programme researcher, broadcasting/film/video customer service complaints" https://www.[facility].com/topic/marketing/contactus/contactus_customerservice.jsp  ____________________________________________________________________________________________  ____________________________________________________________________________________________  Medication Rules  Purpose: To inform patients, and their family members, of our rules and regulations.  Applies to: All patients receiving prescriptions (written or electronic).  Pharmacy of record: Pharmacy where electronic prescriptions will be sent. If written prescriptions are taken to a different pharmacy, please inform the nursing staff. The pharmacy listed in the electronic medical record should be the one where you would like electronic prescriptions to be sent.  Electronic prescriptions: In compliance with the [place] (STOP) Act of 2017 (Session Lanny Cramp [phone]), effective October 23, 2018, all controlled substances must be electronically prescribed. Calling prescriptions  to the pharmacy will cease to exist.  Prescription refills: Only during scheduled appointments. Applies to all prescriptions.  NOTE: The following applies primarily to controlled substances (Opioid* Pain Medications).  Type of encounter (visit): For patients receiving controlled substances, face-to-face visits are required. (Not an option or up to the patient.)  Patient's responsibilities: Pain Pills: Bring all pain pills to every appointment (except for procedure appointments). Pill Bottles: Bring pills in original pharmacy bottle. Always bring the newest bottle. Bring bottle, even if empty. Medication refills: You are responsible for knowing and keeping track of what medications you take and those you need refilled. The day before your appointment: write a list of all prescriptions that need to be refilled. The day of the appointment: give the list to the admitting nurse. Prescriptions will be written only during appointments. No prescriptions will be written on procedure days. If you forget a medication: it will not be "Called in", "Faxed", or "electronically sent". You will need to get another appointment to get these prescribed. No early refills. Do not call asking to have your prescription filled early. Prescription Accuracy: You are responsible for carefully inspecting your prescriptions before leaving our office. Have the discharge nurse carefully go over each prescription with you, before taking them home. Make sure that your name is accurately spelled, that your address is correct. Check the name and dose of your medication to make sure it is accurate. Check the number of pills, and the written instructions to make sure they are clear and accurate. Make sure that you are given enough medication to last until your next medication refill appointment. Taking Medication: Take medication as prescribed. When it comes to controlled substances, taking less pills or less frequently than prescribed  is permitted and encouraged. Never take more pills than instructed. Never take medication more frequently than prescribed.  Inform other Doctors: Always inform, all of your healthcare providers, of all the medications you take. Pain Medication from other Providers: You are not allowed to accept any additional pain medication from any other Doctor or Healthcare provider. There are two exceptions to this rule. (see below) In the event that you require additional pain medication, you are responsible for notifying us, as stated below. Cough Medicine: Often these contain an opioid, such as codeine or hydrocodone. Never accept or take cough medicine containing these opioids if you are already taking an opioid* medication. The combination may cause respiratory failure and death. Medication Agreement: You are responsible for carefully reading and following our Medication Agreement. This must be signed before receiving any prescriptions from our practice. Safely store a copy of your signed Agreement. Violations to the Agreement will result in no further prescriptions. (Additional copies of our Medication Agreement are available upon request.) Laws, Rules, & Regulations: All patients are expected to follow all Federal and [employer], [employer], Rules, [employer]. Ignorance of the Laws does not constitute a valid excuse.  Illegal drugs and Controlled Substances: The use of illegal substances (including, but not limited to marijuana and its derivatives) and/or the illegal use of any controlled substances is strictly prohibited. Violation of this rule may result in the immediate and permanent discontinuation of any and all prescriptions being written by our practice. The use of any illegal substances is prohibited. Adopted CDC guidelines & recommendations: Target dosing levels will be at or below 60 MME/day. Use of benzodiazepines** is not recommended.  Exceptions: There are only two exceptions to the rule of not  receiving pain medications from other Healthcare Providers. Exception #1 (Emergencies): In the event of an emergency (i.e.: accident requiring emergency care), you are allowed to receive additional pain medication. However, you are responsible for: As soon as  you are able, call our office [phone], at any time of the day or night, and leave a message stating your name, the date and nature of the emergency, and the name and dose of the medication prescribed. In the event that your call is answered by a member of our staff, make sure to document and save the date, time, and the name of the person that took your information.  Exception #2 (Planned Surgery): In the event that you are scheduled by another doctor or dentist to have any type of surgery or procedure, you are allowed (for a period no longer than 30 days), to receive additional pain medication, for the acute post-op pain. However, in this case, you are responsible for picking up a copy of our "Post-op Pain Management for Surgeons" handout, and giving it to your surgeon or dentist. This document is available at our office, and does not require an appointment to obtain it. Simply go to our office during business hours (Monday-Thursday from 8:00 AM to 4:00 PM) (Friday 8:00 AM to 12:00 Noon) or if you have a scheduled appointment with [place], prior to your surgery, and ask for it by name. In addition, you are responsible for: calling our office [phone], at any time of the day or night, and leaving a message stating your name, name of your surgeon, type of surgery, and date of procedure or surgery. Failure to comply with your responsibilities may result in termination of therapy involving the controlled substances. Medication Agreement Violation. Following the above rules, including your responsibilities will help you in avoiding a Medication Agreement Violation ("Breaking your Pain Medication Contract").  *Opioid medications include: morphine,  codeine, oxycodone, oxymorphone, hydrocodone, hydromorphone, meperidine, tramadol, tapentadol, buprenorphine, fentanyl, methadone. **Benzodiazepine medications include: diazepam (Valium), alprazolam (Xanax), clonazepam (Klonopine), lorazepam (Ativan), clorazepate (Tranxene), chlordiazepoxide (Librium), estazolam (Prosom), oxazepam (Serax), temazepam (Restoril), triazolam (Halcion) (Last updated: 07/20/2021) ____________________________________________________________________________________________  ____________________________________________________________________________________________  Medication Recommendations and Reminders  Applies to: All patients receiving prescriptions (written and/or electronic).  Medication Rules & Regulations: These rules and regulations exist for your safety and that of others. They are not flexible and neither are we. Dismissing or ignoring them will be considered "non-compliance" with medication therapy, resulting in complete and irreversible termination of such therapy. (See document titled "Medication Rules" for more details.) In all conscience, because of safety reasons, we cannot continue providing a therapy where the patient does not follow instructions.  Pharmacy of record:  Definition: This is the pharmacy where your electronic prescriptions will be sent.  We do not endorse any particular pharmacy, however, we have experienced problems with [facility] not securing enough medication supply for the community. We do not restrict you in your choice of pharmacy. However, once we write for your prescriptions, we will NOT be re-sending more prescriptions to fix restricted supply problems created by your pharmacy, or your insurance.  The pharmacy listed in the electronic medical record should be the one where you want electronic prescriptions to be sent. If you choose to change pharmacy, simply notify our nursing staff.  Recommendations: Keep all of your pain  medications in a safe place, under lock and key, even if you live alone. We will NOT replace lost, stolen, or damaged medication. After you fill your prescription, take 1 week's worth of pills and put them away in a safe place. You should keep a separate, properly labeled bottle for this purpose. The remainder should be kept in the original bottle. Use this as your primary supply, until  it runs out. Once it's gone, then you know that you have 1 week's worth of medicine, and it is time to come in for a prescription refill. If you do this correctly, it is unlikely that you will ever run out of medicine. To make sure that the above recommendation works, it is very important that you make sure your medication refill appointments are scheduled at least 1 week before you run out of medicine. To do this in an effective manner, make sure that you do not leave the office without scheduling your next medication management appointment. Always ask the nursing staff to show you in your prescription , when your medication will be running out. Then arrange for the receptionist to get you a return appointment, at least 7 days before you run out of medicine. Do not wait until you have 1 or 2 pills left, to come in. This is very poor planning and does not take into consideration that we may need to cancel appointments due to bad weather, sickness, or emergencies affecting our staff. DO NOT ACCEPT A "Partial Fill": If for any reason your pharmacy does not have enough pills/tablets to completely fill or refill your prescription, do not allow for a "partial fill". The law allows the pharmacy to complete that prescription within 72 hours, without requiring a new prescription. If they do not fill the rest of your prescription within those 72 hours, you will need a separate prescription to fill the remaining amount, which we will NOT provide. If the reason for the partial fill is your insurance, you will need to talk to the pharmacist  about payment alternatives for the remaining tablets, but again, DO NOT ACCEPT A PARTIAL FILL, unless you can trust your pharmacist to obtain the remainder of the pills within 72 hours.  Prescription refills and/or changes in medication(s):  Prescription refills, and/or changes in dose or medication, will be conducted only during scheduled medication management appointments. (Applies to both, written and electronic prescriptions.) No refills on procedure days. No medication will be changed or started on procedure days. No changes, adjustments, and/or refills will be conducted on a procedure day. Doing so will interfere with the diagnostic portion of the procedure. No phone refills. No medications will be "called into the pharmacy". No Fax refills. No weekend refills. No Holliday refills. No after hours refills.  Remember:  Business hours are:  Monday to Thursday 8:00 AM to 4:00 PM Provider's Schedule: Milinda Pointer, MD - Appointments are:  Medication management: Monday and Wednesday 8:00 AM to 4:00 PM Procedure day: Tuesday and Thursday 7:30 AM to 4:00 PM Gillis Santa, MD - Appointments are:  Medication management: Tuesday and Thursday 8:00 AM to 4:00 PM Procedure day: Monday and Wednesday 7:30 AM to 4:00 PM (Last update: 05/12/2020) ____________________________________________________________________________________________  ____________________________________________________________________________________________  CBD (cannabidiol) & Delta-8 (Delta-8 tetrahydrocannabinol) WARNING  Intro: Cannabidiol (CBD) and tetrahydrocannabinol (THC), are two natural compounds found in plants of the Cannabis genus. They can both be extracted from hemp or cannabis. Hemp and cannabis come from the Cannabis sativa plant. Both compounds interact with your body's endocannabinoid system, but they have very different effects. CBD does not produce the high sensation associated with cannabis. Delta-8  tetrahydrocannabinol, also known as delta-8 THC, is a psychoactive substance found in the Cannabis sativa plant, of which marijuana and hemp are two varieties. THC is responsible for the high associated with the illicit use of marijuana.  Applicable to: All individuals currently taking or considering taking CBD (cannabidiol) and,  more important, all patients taking opioid analgesic controlled substances (pain medication). (Example: oxycodone; oxymorphone; hydrocodone; hydromorphone; morphine; methadone; tramadol; tapentadol; fentanyl; buprenorphine; butorphanol; dextromethorphan; meperidine; codeine; etc.)  Legal status: CBD remains a Schedule I drug prohibited for any use. CBD is illegal with one exception. In the [place], CBD has a limited Transport planner (FDA) approval for the treatment of two specific types of epilepsy disorders. Only one CBD product has been approved by the FDA for this purpose: "Epidiolex". FDA is aware that some companies are marketing products containing cannabis and cannabis-derived compounds in ways that violate the [employer], Drug and Cosmetic Act [facility] Act) and that may put the health and safety of consumers at risk. The FDA, a Federal agency, has not enforced the CBD status since 2018. UPDATE: (12/09/2021) The Drug Enforcement Agency ([place]) issued a letter stating that "delta" cannabinoids, including Delta-8-THCO and Delta-9-THCO, synthetically derived from hemp do not qualify as hemp and will be viewed as Schedule I drugs. (Schedule I drugs, substances, or chemicals are defined as drugs with no currently accepted medical use and a high potential for abuse. Some examples of Schedule I drugs are: heroin, lysergic acid diethylamide (LSD), marijuana (cannabis), 3,4-methylenedioxymethamphetamine (ecstasy), methaqualone, and peyote.) (https://jennings.com/)  Legality: Some manufacturers ship CBD products nationally, which is illegal. Often such products are sold  online and are therefore available throughout the country. CBD is openly sold in head shops and health food stores in some states where such sales have not been explicitly legalized. Selling unapproved products with unsubstantiated therapeutic claims is not only a violation of the law, but also can put patients at risk, as these products have not been proven to be safe or effective. Federal illegality makes it difficult to conduct research on CBD.  Reference: "FDA Regulation of Cannabis and Cannabis-Derived Products, Including Cannabidiol (CBD)" - SeekArtists.com.pt  Warning: CBD is not FDA approved and has not undergo the same manufacturing controls as prescription drugs.  This means that the purity and safety of available CBD may be questionable. Most of the time, despite manufacturer's claims, it is contaminated with THC (delta-9-tetrahydrocannabinol - the chemical in marijuana responsible for the "HIGH").  When this is the case, the [facility] contaminant will trigger a positive urine drug screen (UDS) test for Marijuana (carboxy-THC). Because a positive UDS for any illicit substance is a violation of our medication agreement, your opioid analgesics (pain medicine) may be permanently discontinued. The FDA recently put out a warning about 5 things that everyone should be aware of regarding Delta-8 THC: Delta-8 THC products have not been evaluated or approved by the FDA for safe use and may be marketed in ways that put the public health at risk. The FDA has received adverse event reports involving delta-8 THC-containing products. Delta-8 THC has psychoactive and intoxicating effects. Delta-8 THC manufacturing often involve use of potentially harmful chemicals to create the concentrations of delta-8 THC claimed in the marketplace. The final delta-8 THC product may have potentially harmful by-products  (contaminants) due to the chemicals used in the process. Manufacturing of delta-8 THC products may occur in uncontrolled or unsanitary settings, which may lead to the presence of unsafe contaminants or other potentially harmful substances. Delta-8 THC products should be kept out of the reach of children and pets.  MORE ABOUT CBD  General Information: CBD was discovered in [birth year] and it is a derivative of the cannabis sativa genus plants (Marijuana and Hemp). It is one of the 113 identified substances found in Marijuana.  It accounts for up to 40% of the plant's extract. As of 2018, preliminary clinical studies on CBD included research for the treatment of anxiety, movement disorders, and pain. CBD is available and consumed in multiple forms, including inhalation of smoke or vapor, as an aerosol spray, and by mouth. It may be supplied as an oil containing CBD, capsules, dried cannabis, or as a liquid solution. CBD is thought not to be as psychoactive as THC (delta-9-tetrahydrocannabinol - the chemical in marijuana responsible for the "HIGH"). Studies suggest that CBD may interact with different biological target receptors in the body, including cannabinoid and other neurotransmitter receptors. As of 2018 the mechanism of action for its biological effects has not been determined.  Side-effects  Adverse reactions: Dry mouth, diarrhea, decreased appetite, fatigue, drowsiness, malaise, weakness, sleep disturbances, and others.  Drug interactions: CBC may interact with other medications such as blood-thinners. Because CBD causes drowsiness on its own, it also increases the drowsiness caused by other medications, including antihistamines (such as Benadryl), benzodiazepines (Xanax, Ativan, Valium), antipsychotics, antidepressants and opioids, as well as alcohol and supplements such as kava, melatonin and St. John's Wort. Be cautious with the following combinations:   Brivaracetam (Briviact) Brivaracetam is changed  and broken down by the body. CBD might decrease how quickly the body breaks down brivaracetam. This might increase levels of brivaracetam in the body.  Caffeine Caffeine is changed and broken down by the body. CBD might decrease how quickly the body breaks down caffeine. This might increase levels of caffeine in the body.  Carbamazepine (Tegretol) Carbamazepine is changed and broken down by the body. CBD might decrease how quickly the body breaks down carbamazepine. This might increase levels of carbamazepine in the body and increase its side effects.  Citalopram (Celexa) Citalopram is changed and broken down by the body. CBD might decrease how quickly the body breaks down citalopram. This might increase levels of citalopram in the body and increase its side effects.  Clobazam (Onfi) Clobazam is changed and broken down by the liver. CBD might decrease how quickly the liver breaks down clobazam. This might increase the effects and side effects of clobazam.  Eslicarbazepine (Aptiom) Eslicarbazepine is changed and broken down by the body. CBD might decrease how quickly the body breaks down eslicarbazepine. This might increase levels of eslicarbazepine in the body by a small amount.  Everolimus (Zostress) Everolimus is changed and broken down by the body. CBD might decrease how quickly the body breaks down everolimus. This might increase levels of everolimus in the body.  Lithium Taking higher doses of CBD might increase levels of lithium. This can increase the risk of lithium toxicity.  Medications changed by the liver (Cytochrome P450 1A1 (CYP1A1) substrates) Some medications are changed and broken down by the liver. CBD might change how quickly the liver breaks down these medications. This could change the effects and side effects of these medications.  Medications changed by the liver (Cytochrome P450 1A2 (CYP1A2) substrates) Some medications are changed and broken down by the liver. CBD  might change how quickly the liver breaks down these medications. This could change the effects and side effects of these medications.  Medications changed by the liver (Cytochrome P450 1B1 (CYP1B1) substrates) Some medications are changed and broken down by the liver. CBD might change how quickly the liver breaks down these medications. This could change the effects and side effects of these medications.  Medications changed by the liver (Cytochrome P450 2A6 (CYP2A6) substrates) Some medications are changed and  broken down by the liver. CBD might change how quickly the liver breaks down these medications. This could change the effects and side effects of these medications.  Medications changed by the liver (Cytochrome P450 2B6 (CYP2B6) substrates) Some medications are changed and broken down by the liver. CBD might change how quickly the liver breaks down these medications. This could change the effects and side effects of these medications.  Medications changed by the liver (Cytochrome P450 2C19 (CYP2C19) substrates) Some medications are changed and broken down by the liver. CBD might change how quickly the liver breaks down these medications. This could change the effects and side effects of these medications.  Medications changed by the liver (Cytochrome P450 2C8 (CYP2C8) substrates) Some medications are changed and broken down by the liver. CBD might change how quickly the liver breaks down these medications. This could change the effects and side effects of these medications.  Medications changed by the liver (Cytochrome P450 2C9 (CYP2C9) substrates) Some medications are changed and broken down by the liver. CBD might change how quickly the liver breaks down these medications. This could change the effects and side effects of these medications.  Medications changed by the liver (Cytochrome P450 2D6 (CYP2D6) substrates) Some medications are changed and broken down by the liver. CBD might  change how quickly the liver breaks down these medications. This could change the effects and side effects of these medications.  Medications changed by the liver (Cytochrome P450 2E1 (CYP2E1) substrates) Some medications are changed and broken down by the liver. CBD might change how quickly the liver breaks down these medications. This could change the effects and side effects of these medications.  Medications changed by the liver (Cytochrome P450 3A4 (CYP3A4) substrates) Some medications are changed and broken down by the liver. CBD might change how quickly the liver breaks down these medications. This could change the effects and side effects of these medications.  Medications changed by the liver (Glucuronidated drugs) Some medications are changed and broken down by the liver. CBD might change how quickly the liver breaks down these medications. This could change the effects and side effects of these medications.  Medications that decrease the breakdown of other medications by the liver (Cytochrome P450 2C19 (CYP2C19) inhibitors) CBD is changed and broken down by the liver. Some drugs decrease how quickly the liver changes and breaks down CBD. This could change the effects and side effects of CBD.  Medications that decrease the breakdown of other medications in the liver (Cytochrome P450 3A4 (CYP3A4) inhibitors) CBD is changed and broken down by the liver. Some drugs decrease how quickly the liver changes and breaks down CBD. This could change the effects and side effects of CBD.  Medications that increase breakdown of other medications by the liver (Cytochrome P450 3A4 (CYP3A4) inducers) CBD is changed and broken down by the liver. Some drugs increase how quickly the liver changes and breaks down CBD. This could change the effects and side effects of CBD.  Medications that increase the breakdown of other medications by the liver (Cytochrome P450 2C19 (CYP2C19) inducers) CBD is changed and  broken down by the liver. Some drugs increase how quickly the liver changes and breaks down CBD. This could change the effects and side effects of CBD.  Methadone (Dolophine) Methadone is broken down by the liver. CBD might decrease how quickly the liver breaks down methadone. Taking cannabidiol along with methadone might increase the effects and side effects of methadone.  Rufinamide (Banzel) Rufinamide is  changed and broken down by the body. CBD might decrease how quickly the body breaks down rufinamide. This might increase levels of rufinamide in the body by a small amount.  Sedative medications (CNS depressants) CBD might cause sleepiness and slowed breathing. Some medications, called sedatives, can also cause sleepiness and slowed breathing. Taking CBD with sedative medications might cause breathing problems and/or too much sleepiness.  Sirolimus (Rapamune) Sirolimus is changed and broken down by the body. CBD might decrease how quickly the body breaks down sirolimus. This might increase levels of sirolimus in the body.  Stiripentol (Diacomit) Stiripentol is changed and broken down by the body. CBD might decrease how quickly the body breaks down stiripentol. This might increase levels of stiripentol in the body and increase its side effects.  Tacrolimus (Prograf) Tacrolimus is changed and broken down by the body. CBD might decrease how quickly the body breaks down tacrolimus. This might increase levels of tacrolimus in the body.  Tamoxifen (Soltamox) Tamoxifen is changed and broken down by the body. CBD might affect how quickly the body breaks down tamoxifen. This might affect levels of tamoxifen in the body.  Topiramate (Topamax) Topiramate is changed and broken down by the body. CBD might decrease how quickly the body breaks down topiramate. This might increase levels of topiramate in the body by a small amount.  Valproate Valproic acid can cause liver injury. Taking cannabidiol  with valproic acid might increase the chance of liver injury. CBD and/or valproic acid might need to be stopped, or the dose might need to be reduced.  Warfarin (Coumadin) CBD might increase levels of warfarin, which can increase the risk for bleeding. CBD and/or warfarin might need to be stopped, or the dose might need to be reduced.  Zonisamide Zonisamide is changed and broken down by the body. CBD might decrease how quickly the body breaks down zonisamide. This might increase levels of zonisamide in the body by a small amount. (Last update: 12/21/2021) ____________________________________________________________________________________________  ____________________________________________________________________________________________  Drug Holidays (Slow)  What is a "Drug Holiday"? Drug Holiday: is the name given to the period of time during which a patient stops taking a medication(s) for the purpose of eliminating tolerance to the drug.  Benefits Improved effectiveness of opioids. Decreased opioid dose needed to achieve benefits. Improved pain with lesser dose.  What is tolerance? Tolerance: is the progressive decreased in effectiveness of a drug due to its repetitive use. With repetitive use, the body gets use to the medication and as a consequence, it loses its effectiveness. This is a common problem seen with opioid pain medications. As a result, a larger dose of the drug is needed to achieve the same effect that used to be obtained with a smaller dose.  How long should a "Drug Holiday" last? You should stay off of the pain medicine for at least 14 consecutive days. (2 weeks)  Should I stop the medicine "cold [place]"? No. You should always coordinate with your Pain Specialist so that he/she can provide you with the correct medication dose to make the transition as smoothly as possible.  How do I stop the medicine? Slowly. You will be instructed to decrease the daily amount of  pills that you take by one (1) pill every seven (7) days. This is called a "slow downward taper" of your dose. For example: if you normally take four (4) pills per day, you will be asked to drop this dose to three (3) pills per day for seven (7) days, then to  two (2) pills per day for seven (7) days, then to one (1) per day for seven (7) days, and at the end of those last seven (7) days, this is when the "Drug Holiday" would start.   Will I have withdrawals? By doing a "slow downward taper" like this one, it is unlikely that you will experience any significant withdrawal symptoms. Typically, what triggers withdrawals is the sudden stop of a high dose opioid therapy. Withdrawals can usually be avoided by slowly decreasing the dose over a prolonged period of time. If you do not follow these instructions and decide to stop your medication abruptly, withdrawals may be possible.  What are withdrawals? Withdrawals: refers to the wide range of symptoms that occur after stopping or dramatically reducing opiate drugs after heavy and prolonged use. Withdrawal symptoms do not occur to patients that use low dose opioids, or those who take the medication sporadically. Contrary to benzodiazepine (example: Valium, Xanax, etc.) or alcohol withdrawals ("Delirium Tremens"), opioid withdrawals are not lethal. Withdrawals are the physical manifestation of the body getting rid of the excess receptors.  Expected Symptoms Early symptoms of withdrawal may include: Agitation Anxiety Muscle aches Increased tearing Insomnia Runny nose Sweating Yawning  Late symptoms of withdrawal may include: Abdominal cramping Diarrhea Dilated pupils Goose bumps Nausea Vomiting  Will I experience withdrawals? Due to the slow nature of the taper, it is very unlikely that you will experience any.  What is a slow taper? Taper: refers to the gradual decrease in dose.  (Last update:  05/12/2020) ____________________________________________________________________________________________   ______________________________________________________________________  Preparing for Procedure with Sedation  NOTICE: Due to recent regulatory changes, starting on May 23, 2021, procedures requiring intravenous (IV) sedation will no longer be performed at the [facility].  These types of procedures are required to be performed at [facility] ambulatory surgery facility.  We are very sorry for the inconvenience.  Procedure appointments are limited to planned procedures: No Prescription Refills. No disability issues will be discussed. No medication changes will be discussed.  Instructions: Oral Intake: Do not eat or drink anything for at least 8 hours prior to your procedure. (Exception: Blood Pressure Medication. See below.) Transportation: A driver is required. You may not drive yourself after the procedure. Blood Pressure Medicine: Do not forget to take your blood pressure medicine with a sip of water the morning of the procedure. If your Diastolic (lower reading) is above 100 mmHg, elective cases will be cancelled/rescheduled. Blood thinners: These will need to be stopped for procedures. Notify our staff if you are taking any blood thinners. Depending on which one you take, there will be specific instructions on how and when to stop it. Diabetics on insulin: Notify the staff so that you can be scheduled 1st case in the morning. If your diabetes requires high dose insulin, take only  of your normal insulin dose the morning of the procedure and notify the staff that you have done so. Preventing infections: Shower with an antibacterial soap the morning of your procedure. Build-up your immune system: Take 1000 mg of Vitamin C with every meal (3 times a day) the day prior to your procedure. Antibiotics: Inform the staff if you have a condition or reason that requires you to take  antibiotics before dental procedures. Pregnancy: If you are pregnant, call and cancel the procedure. Sickness: If you have a cold, fever, or any active infections, call and cancel the procedure. Arrival: You must be in the facility at least 30 minutes prior to  your scheduled procedure. Children: Do not bring children with you. Dress appropriately: There is always the possibility that your clothing may get soiled. Valuables: Do not bring any jewelry or valuables.  Reasons to call and reschedule or cancel your procedure: (Following these recommendations will minimize the risk of a serious complication.) Surgeries: Avoid having procedures within 2 weeks of any surgery. (Avoid for 2 weeks before or after any surgery). Flu Shots: Avoid having procedures within 2 weeks of a flu shots. (Avoid for 2 weeks before or after immunizations). Barium: Avoid having a procedure within 7-10 days after having had a radiological study involving the use of radiological contrast. (Myelograms, Barium swallow or enema study). Heart attacks: Avoid any elective procedures or surgeries for the initial 6 months after a "Myocardial Infarction" (Heart Attack). Blood thinners: It is imperative that you stop these medications before procedures. Let us know if you if you take any blood thinner.  Infection: Avoid procedures during or within two weeks of an infection (including chest colds or gastrointestinal problems). Symptoms associated with infections include: Localized redness, fever, chills, night sweats or profuse sweating, burning sensation when voiding, cough, congestion, stuffiness, runny nose, sore throat, diarrhea, nausea, vomiting, cold or Flu symptoms, recent or current infections. It is specially important if the infection is over the area that we intend to treat. Heart and lung problems: Symptoms that may suggest an active cardiopulmonary problem include: cough, chest pain, breathing difficulties or shortness of breath,  dizziness, ankle swelling, uncontrolled high or unusually low blood pressure, and/or palpitations. If you are experiencing any of these symptoms, cancel your procedure and contact your primary care physician for an evaluation.  Remember:  Regular Business hours are:  Monday to Thursday 8:00 AM to 4:00 PM  Provider's Schedule: Milinda Pointer, MD:  Procedure days: Tuesday and Thursday 7:30 AM to 4:00 PM  Gillis Santa, MD:  Procedure days: Monday and Wednesday 7:30 AM to 4:00 PM ______________________________________________________________________  ____________________________________________________________________________________________  General Risks and Possible Complications  Patient Responsibilities: It is important that you read this as it is part of your informed consent. It is our duty to inform you of the risks and possible complications associated with treatments offered to you. It is your responsibility as a patient to read this and to ask questions about anything that is not clear or that you believe was not covered in this document.  Patient's Rights: You have the right to refuse treatment. You also have the right to change your mind, even after initially having agreed to have the treatment done. However, under this last option, if you wait until the last second to change your mind, you may be charged for the materials used up to that point.  Introduction: Medicine is not an Chief Strategy Officer. Everything in Medicine, including the lack of treatment(s), carries the potential for danger, harm, or loss (which is by definition: Risk). In Medicine, a complication is a secondary problem, condition, or disease that can aggravate an already existing one. All treatments carry the risk of possible complications. The fact that a side effects or complications occurs, does not imply that the treatment was conducted incorrectly. It must be clearly understood that these can happen even when  everything is done following the highest safety standards.  No treatment: You can choose not to proceed with the proposed treatment alternative. The "PRO(s)" would include: avoiding the risk of complications associated with the therapy. The "CON(s)" would include: not getting any of the treatment benefits. These benefits fall under  one of three categories: diagnostic; therapeutic; and/or palliative. Diagnostic benefits include: getting information which can ultimately lead to improvement of the disease or symptom(s). Therapeutic benefits are those associated with the successful treatment of the disease. Finally, palliative benefits are those related to the decrease of the primary symptoms, without necessarily curing the condition (example: decreasing the pain from a flare-up of a chronic condition, such as incurable terminal cancer).  General Risks and Complications: These are associated to most interventional treatments. They can occur alone, or in combination. They fall under one of the following six (6) categories: no benefit or worsening of symptoms; bleeding; infection; nerve damage; allergic reactions; and/or death. No benefits or worsening of symptoms: In Medicine there are no guarantees, only probabilities. No healthcare provider can ever guarantee that a medical treatment will work, they can only state the probability that it may. Furthermore, there is always the possibility that the condition may worsen, either directly, or indirectly, as a consequence of the treatment. Bleeding: This is more common if the patient is taking a blood thinner, either prescription or over the counter (example: Goody Powders, Fish oil, Aspirin, Garlic, etc.), or if suffering a condition associated with impaired coagulation (example: Hemophilia, cirrhosis of the liver, low platelet counts, etc.). However, even if you do not have one on these, it can still happen. If you have any of these conditions, or take one of these  drugs, make sure to notify your treating physician. Infection: This is more common in patients with a compromised immune system, either due to disease (example: diabetes, cancer, human immunodeficiency virus [HIV], etc.), or due to medications or treatments (example: therapies used to treat cancer and rheumatological diseases). However, even if you do not have one on these, it can still happen. If you have any of these conditions, or take one of these drugs, make sure to notify your treating physician. Nerve Damage: This is more common when the treatment is an invasive one, but it can also happen with the use of medications, such as those used in the treatment of cancer. The damage can occur to small secondary nerves, or to large primary ones, such as those in the spinal cord and brain. This damage may be temporary or permanent and it may lead to impairments that can range from temporary numbness to permanent paralysis and/or brain death. Allergic Reactions: Any time a substance or material comes in contact with our body, there is the possibility of an allergic reaction. These can range from a mild skin rash (contact dermatitis) to a severe systemic reaction (anaphylactic reaction), which can result in death. Death: In general, any medical intervention can result in death, most of the time due to an unforeseen complication. ____________________________________________________________________________________________ ______________________________________________________________________  Preparing for Procedure with Sedation  NOTICE: Due to recent regulatory changes, starting on May 23, 2021, procedures requiring intravenous (IV) sedation will no longer be performed at the [place].  These types of procedures are required to be performed at [facility] ambulatory surgery facility.  We are very sorry for the inconvenience.  Procedure appointments are limited to planned procedures: No Prescription  Refills. No disability issues will be discussed. No medication changes will be discussed.  Instructions: Oral Intake: Do not eat or drink anything for at least 8 hours prior to your procedure. (Exception: Blood Pressure Medication. See below.) Transportation: A driver is required. You may not drive yourself after the procedure. Blood Pressure Medicine: Do not forget to take your blood pressure medicine with a sip  of water the morning of the procedure. If your Diastolic (lower reading) is above 100 mmHg, elective cases will be cancelled/rescheduled. Blood thinners: These will need to be stopped for procedures. Notify our staff if you are taking any blood thinners. Depending on which one you take, there will be specific instructions on how and when to stop it. Diabetics on insulin: Notify the staff so that you can be scheduled 1st case in the morning. If your diabetes requires high dose insulin, take only  of your normal insulin dose the morning of the procedure and notify the staff that you have done so. Preventing infections: Shower with an antibacterial soap the morning of your procedure. Build-up your immune system: Take 1000 mg of Vitamin C with every meal (3 times a day) the day prior to your procedure. Antibiotics: Inform the staff if you have a condition or reason that requires you to take antibiotics before dental procedures. Pregnancy: If you are pregnant, call and cancel the procedure. Sickness: If you have a cold, fever, or any active infections, call and cancel the procedure. Arrival: You must be in the facility at least 30 minutes prior to your scheduled procedure. Children: Do not bring children with you. Dress appropriately: There is always the possibility that your clothing may get soiled. Valuables: Do not bring any jewelry or valuables.  Reasons to call and reschedule or cancel your procedure: (Following these recommendations will minimize the risk of a serious  complication.) Surgeries: Avoid having procedures within 2 weeks of any surgery. (Avoid for 2 weeks before or after any surgery). Flu Shots: Avoid having procedures within 2 weeks of a flu shots. (Avoid for 2 weeks before or after immunizations). Barium: Avoid having a procedure within 7-10 days after having had a radiological study involving the use of radiological contrast. (Myelograms, Barium swallow or enema study). Heart attacks: Avoid any elective procedures or surgeries for the initial 6 months after a "Myocardial Infarction" (Heart Attack). Blood thinners: It is imperative that you stop these medications before procedures. Let us know if you if you take any blood thinner.  Infection: Avoid procedures during or within two weeks of an infection (including chest colds or gastrointestinal problems). Symptoms associated with infections include: Localized redness, fever, chills, night sweats or profuse sweating, burning sensation when voiding, cough, congestion, stuffiness, runny nose, sore throat, diarrhea, nausea, vomiting, cold or Flu symptoms, recent or current infections. It is specially important if the infection is over the area that we intend to treat. Heart and lung problems: Symptoms that may suggest an active cardiopulmonary problem include: cough, chest pain, breathing difficulties or shortness of breath, dizziness, ankle swelling, uncontrolled high or unusually low blood pressure, and/or palpitations. If you are experiencing any of these symptoms, cancel your procedure and contact your primary care physician for an evaluation.  Remember:  Regular Business hours are:  Monday to Thursday 8:00 AM to 4:00 PM  Provider's Schedule: Milinda Pointer, MD:  Procedure days: Tuesday and Thursday 7:30 AM to 4:00 PM  Gillis Santa, MD:  Procedure days: Monday and Wednesday 7:30 AM to 4:00 PM ______________________________________________________________________   ____________________________________________________________________________________________  General Risks and Possible Complications  Patient Responsibilities: It is important that you read this as it is part of your informed consent. It is our duty to inform you of the risks and possible complications associated with treatments offered to you. It is your responsibility as a patient to read this and to ask questions about anything that is not clear or that  you believe was not covered in this document.  Patient's Rights: You have the right to refuse treatment. You also have the right to change your mind, even after initially having agreed to have the treatment done. However, under this last option, if you wait until the last second to change your mind, you may be charged for the materials used up to that point.  Introduction: Medicine is not an Chief Strategy Officer. Everything in Medicine, including the lack of treatment(s), carries the potential for danger, harm, or loss (which is by definition: Risk). In Medicine, a complication is a secondary problem, condition, or disease that can aggravate an already existing one. All treatments carry the risk of possible complications. The fact that a side effects or complications occurs, does not imply that the treatment was conducted incorrectly. It must be clearly understood that these can happen even when everything is done following the highest safety standards.  No treatment: You can choose not to proceed with the proposed treatment alternative. The "PRO(s)" would include: avoiding the risk of complications associated with the therapy. The "CON(s)" would include: not getting any of the treatment benefits. These benefits fall under one of three categories: diagnostic; therapeutic; and/or palliative. Diagnostic benefits include: getting information which can ultimately lead to improvement of the disease or symptom(s). Therapeutic benefits are those associated with the  successful treatment of the disease. Finally, palliative benefits are those related to the decrease of the primary symptoms, without necessarily curing the condition (example: decreasing the pain from a flare-up of a chronic condition, such as incurable terminal cancer).  General Risks and Complications: These are associated to most interventional treatments. They can occur alone, or in combination. They fall under one of the following six (6) categories: no benefit or worsening of symptoms; bleeding; infection; nerve damage; allergic reactions; and/or death. No benefits or worsening of symptoms: In Medicine there are no guarantees, only probabilities. No healthcare provider can ever guarantee that a medical treatment will work, they can only state the probability that it may. Furthermore, there is always the possibility that the condition may worsen, either directly, or indirectly, as a consequence of the treatment. Bleeding: This is more common if the patient is taking a blood thinner, either prescription or over the counter (example: Goody Powders, Fish oil, Aspirin, Garlic, etc.), or if suffering a condition associated with impaired coagulation (example: Hemophilia, cirrhosis of the liver, low platelet counts, etc.). However, even if you do not have one on these, it can still happen. If you have any of these conditions, or take one of these drugs, make sure to notify your treating physician. Infection: This is more common in patients with a compromised immune system, either due to disease (example: diabetes, cancer, human immunodeficiency virus [HIV], etc.), or due to medications or treatments (example: therapies used to treat cancer and rheumatological diseases). However, even if you do not have one on these, it can still happen. If you have any of these conditions, or take one of these drugs, make sure to notify your treating physician. Nerve Damage: This is more common when the treatment is an invasive  one, but it can also happen with the use of medications, such as those used in the treatment of cancer. The damage can occur to small secondary nerves, or to large primary ones, such as those in the spinal cord and brain. This damage may be temporary or permanent and it may lead to impairments that can range from temporary numbness to permanent paralysis and/or  brain death. Allergic Reactions: Any time a substance or material comes in contact with our body, there is the possibility of an allergic reaction. These can range from a mild skin rash (contact dermatitis) to a severe systemic reaction (anaphylactic reaction), which can result in death. Death: In general, any medical intervention can result in death, most of the time due to an unforeseen complication. ____________________________________________________________________________________________ Facet Blocks Patient Information  Description: The facets are joints in the spine between the vertebrae.  Like any joints in the body, facets can become irritated and painful.  Arthritis can also effect the facets.  By injecting steroids and local anesthetic in and around these joints, we can temporarily block the nerve supply to them.  Steroids act directly on irritated nerves and tissues to reduce selling and inflammation which often leads to decreased pain.  Facet blocks may be done anywhere along the spine from the neck to the low back depending upon the location of your pain.   After numbing the skin with local anesthetic (like Novocaine), a small needle is passed onto the facet joints under x-ray guidance.  You may experience a sensation of pressure while this is being done.  The entire block usually lasts about 15-25 minutes.   Conditions which may be treated by facet blocks:  Low back/buttock pain Neck/shoulder pain Certain types of headaches  Preparation for the injection:  Do not eat any solid food or dairy products within 8 hours of your  appointment. You may drink clear liquid up to 3 hours before appointment.  Clear liquids include water, black coffee, juice or soda.  No milk or cream please. You may take your regular medication, including pain medications, with a sip of water before your appointment.  Diabetics should hold regular insulin (if taken separately) and take 1/2 normal NPH dose the morning of the procedure.  Carry some sugar containing items with you to your appointment. A driver must accompany you and be prepared to drive you home after your procedure. Bring all your current medications with you. An IV may be inserted and sedation may be given at the discretion of the physician. A blood pressure cuff, EKG and other monitors will often be applied during the procedure.  Some patients may need to have extra oxygen administered for a short period. You will be asked to provide medical information, including your allergies and medications, prior to the procedure.  We must know immediately if you are taking blood thinners (like Coumadin/Warfarin) or if you are allergic to IV iodine contrast (dye).  We must know if you could possible be pregnant.  Possible side-effects:  Bleeding from needle site Infection (rare, may require surgery) Nerve injury (rare) Numbness & tingling (temporary) Difficulty urinating (rare, temporary) Spinal headache (a headache worse with upright posture) Light-headedness (temporary) Pain at injection site (serveral days) Decreased blood pressure (rare, temporary) Weakness in arm/leg (temporary) Pressure sensation in back/neck (temporary)   Call if you experience:  Fever/chills associated with headache or increased back/neck pain Headache worsened by an upright position New onset, weakness or numbness of an extremity below the injection site Hives or difficulty breathing (go to the emergency room) Inflammation or drainage at the injection site(s) Severe back/neck pain greater than  usual New symptoms which are concerning to you  Please note:  Although the local anesthetic injected can often make your back or neck feel good for several hours after the injection, the pain will likely return. It takes 3-7 days for steroids to work.  You may not notice any pain relief for at least one week.  If effective, we will often do a series of 2-3 injections spaced 3-6 weeks apart to maximally decrease your pain.  After the initial series, you may be a candidate for a more permanent nerve block of the facets.  If you have any questions, please call #[phone]) [facility]

## 2022-07-05 NOTE — Progress Notes (Signed)
Safety precautions to be maintained throughout the outpatient stay will include: orient to surroundings, keep bed in low position, maintain call bell within reach at all times, provide assistance with transfer out of bed and ambulation.  

## 2022-07-06 ENCOUNTER — Other Ambulatory Visit: Payer: Self-pay

## 2022-07-07 ENCOUNTER — Other Ambulatory Visit: Payer: Self-pay

## 2022-07-07 ENCOUNTER — Encounter: Payer: Self-pay | Admitting: Oncology

## 2022-07-11 ENCOUNTER — Other Ambulatory Visit: Payer: Self-pay

## 2022-07-12 ENCOUNTER — Ambulatory Visit (HOSPITAL_BASED_OUTPATIENT_CLINIC_OR_DEPARTMENT_OTHER): Admit: 2022-07-12 | Payer: No Typology Code available for payment source | Admitting: Plastic Surgery

## 2022-07-12 ENCOUNTER — Encounter (HOSPITAL_BASED_OUTPATIENT_CLINIC_OR_DEPARTMENT_OTHER): Payer: Self-pay

## 2022-07-12 SURGERY — CAPSULECTOMY, BREAST
Anesthesia: General | Laterality: Right

## 2022-07-17 ENCOUNTER — Other Ambulatory Visit: Payer: Self-pay

## 2022-07-19 ENCOUNTER — Inpatient Hospital Stay: Payer: No Typology Code available for payment source

## 2022-07-19 ENCOUNTER — Other Ambulatory Visit: Payer: Self-pay

## 2022-07-19 VITALS — BP 117/82 | HR 76 | Temp 97.5°F | Resp 16 | Ht 60.0 in | Wt 116.5 lb

## 2022-07-19 DIAGNOSIS — Z5112 Encounter for antineoplastic immunotherapy: Secondary | ICD-10-CM | POA: Diagnosis not present

## 2022-07-19 DIAGNOSIS — Z170 Estrogen receptor positive status [ER+]: Secondary | ICD-10-CM

## 2022-07-19 MED ORDER — SODIUM CHLORIDE 0.9 % IV SOLN
Freq: Once | INTRAVENOUS | Status: DC
  Filled 2022-07-19: qty 250

## 2022-07-19 MED ORDER — DIPHENHYDRAMINE HCL 25 MG PO CAPS
50.0000 mg | ORAL_CAPSULE | Freq: Once | ORAL | Status: AC
  Administered 2022-07-19: 50 mg via ORAL
  Filled 2022-07-19: qty 2

## 2022-07-19 MED ORDER — PERTUZ-TRASTUZ-HYALURON-ZZXF 60-60-2000 MG-MG-U/ML CHEMO ~~LOC~~ SOLN
10.0000 mL | Freq: Once | SUBCUTANEOUS | Status: AC
  Administered 2022-07-19: 10 mL via SUBCUTANEOUS
  Filled 2022-07-19: qty 10

## 2022-07-19 MED ORDER — ACETAMINOPHEN 325 MG PO TABS
650.0000 mg | ORAL_TABLET | Freq: Once | ORAL | Status: AC
  Administered 2022-07-19: 650 mg via ORAL
  Filled 2022-07-19: qty 2

## 2022-07-19 NOTE — Patient Instructions (Signed)
[facility] AT [place]  Discharge Instructions: Thank you for choosing [facility] to provide your oncology and hematology care.  If you have a lab appointment with the [facility], please go directly to the [facility] and check in at the registration area.  Wear comfortable clothing and clothing appropriate for easy access to any Portacath or PICC line.   We strive to give you quality time with your provider. You may need to reschedule your appointment if you arrive late (15 or more minutes).  Arriving late affects you and other patients whose appointments are after yours.  Also, if you miss three or more appointments without notifying the office, you may be dismissed from the clinic at the provider's discretion.      For prescription refill requests, have your pharmacy contact our office and allow 72 hours for refills to be completed.    Today you received the following chemotherapy and/or immunotherapy agents [place]      To help prevent nausea and vomiting after your treatment, we encourage you to take your nausea medication as directed.  BELOW ARE SYMPTOMS THAT SHOULD BE REPORTED IMMEDIATELY: *FEVER GREATER THAN 100.4 F (38 C) OR HIGHER *CHILLS OR SWEATING *NAUSEA AND VOMITING THAT IS NOT CONTROLLED WITH YOUR NAUSEA MEDICATION *UNUSUAL SHORTNESS OF BREATH *UNUSUAL BRUISING OR BLEEDING *URINARY PROBLEMS (pain or burning when urinating, or frequent urination) *BOWEL PROBLEMS (unusual diarrhea, constipation, pain near the anus) TENDERNESS IN MOUTH AND THROAT WITH OR WITHOUT PRESENCE OF ULCERS (sore throat, sores in mouth, or a toothache) UNUSUAL RASH, SWELLING OR PAIN  UNUSUAL VAGINAL DISCHARGE OR ITCHING   Items with * indicate a potential emergency and should be followed up as soon as possible or go to the Emergency Department if any problems should occur.  Please show the CHEMOTHERAPY ALERT CARD or IMMUNOTHERAPY ALERT CARD at check-in to the  Emergency Department and triage nurse.  Should you have questions after your visit or need to cancel or reschedule your appointment, please contact [facility] [place] AT [place]  [phone] and follow the prompts.  Office hours are 8:00 a.m. to 4:30 p.m. Monday - Friday. Please note that voicemails left after 4:00 p.m. may not be returned until the following business day.  We are closed weekends and major holidays. You have access to a nurse at all times for urgent questions. Please call the main number to the clinic [phone] and follow the prompts.  For any non-urgent questions, you may also contact your provider using MyChart. We now offer e-Visits for anyone 52 and older to request care online for non-urgent symptoms. For details visit [email or web address].   Also download the MyChart app! Go to the app store, search "MyChart", open the app, select [facility], and log in with your MyChart username and password.  Masks are optional in the cancer centers. If you would like for your care team to wear a mask while they are taking care of you, please let them know. For doctor visits, patients may have with them one support person who is at least 52 years old. At this time, visitors are not allowed in the infusion area.

## 2022-07-20 ENCOUNTER — Other Ambulatory Visit: Payer: Self-pay

## 2022-07-20 MED ORDER — GENTAMICIN SULFATE 0.1 % EX OINT
TOPICAL_OINTMENT | CUTANEOUS | 12 refills | Status: DC
  Filled 2022-07-20: qty 30, 30d supply, fill #0

## 2022-07-21 ENCOUNTER — Encounter: Payer: No Typology Code available for payment source | Admitting: Surgical

## 2022-07-25 ENCOUNTER — Telehealth: Payer: Self-pay

## 2022-07-25 NOTE — Telephone Encounter (Signed)
Insurance Treatment Denial Note  Date order was entered:  Order entered by: Milinda Pointer, MD Requested treatment: 06/25/2022 Reason for denial:  patient has already had facets by [facility] Recommended for approval:  no recommendations   Because the patient had facets done by Dr. Sharlet Salina with no results they are not approving another one, even though a different level was requested. They state there is no indication why the member requires a third set of repeat facet blocks. Where do you want to go from here?

## 2022-07-26 ENCOUNTER — Other Ambulatory Visit (HOSPITAL_COMMUNITY): Payer: Self-pay

## 2022-07-27 ENCOUNTER — Inpatient Hospital Stay: Payer: No Typology Code available for payment source | Attending: Oncology

## 2022-07-27 ENCOUNTER — Encounter: Payer: Self-pay | Admitting: Pharmacist

## 2022-07-27 ENCOUNTER — Other Ambulatory Visit: Payer: Self-pay

## 2022-07-27 ENCOUNTER — Inpatient Hospital Stay: Payer: No Typology Code available for payment source | Admitting: Pharmacist

## 2022-07-27 VITALS — BP 92/60 | HR 73 | Temp 98.5°F | Resp 16 | Ht 60.0 in | Wt 115.8 lb

## 2022-07-27 DIAGNOSIS — T451X5A Adverse effect of antineoplastic and immunosuppressive drugs, initial encounter: Secondary | ICD-10-CM | POA: Diagnosis not present

## 2022-07-27 DIAGNOSIS — C50412 Malignant neoplasm of upper-outer quadrant of left female breast: Secondary | ICD-10-CM | POA: Insufficient documentation

## 2022-07-27 DIAGNOSIS — C7931 Secondary malignant neoplasm of brain: Secondary | ICD-10-CM | POA: Insufficient documentation

## 2022-07-27 DIAGNOSIS — Z79899 Other long term (current) drug therapy: Secondary | ICD-10-CM | POA: Diagnosis not present

## 2022-07-27 DIAGNOSIS — Z170 Estrogen receptor positive status [ER+]: Secondary | ICD-10-CM

## 2022-07-27 DIAGNOSIS — E2839 Other primary ovarian failure: Secondary | ICD-10-CM

## 2022-07-27 DIAGNOSIS — Z79811 Long term (current) use of aromatase inhibitors: Secondary | ICD-10-CM | POA: Diagnosis not present

## 2022-07-27 DIAGNOSIS — G620 Drug-induced polyneuropathy: Secondary | ICD-10-CM | POA: Diagnosis not present

## 2022-07-27 LAB — COMPREHENSIVE METABOLIC PANEL
ALT: 15 U/L (ref 0–44)
AST: 20 U/L (ref 15–41)
Albumin: 3.6 g/dL (ref 3.5–5.0)
Alkaline Phosphatase: 58 U/L (ref 38–126)
Anion gap: 8 (ref 5–15)
BUN: 9 mg/dL (ref 6–20)
CO2: 27 mmol/L (ref 22–32)
Calcium: 9.3 mg/dL (ref 8.9–10.3)
Chloride: 101 mmol/L (ref 98–111)
Creatinine, Ser: 1.13 mg/dL — ABNORMAL HIGH (ref 0.44–1.00)
GFR, Estimated: 59 mL/min — ABNORMAL LOW (ref >60)
Glucose, Bld: 132 mg/dL — ABNORMAL HIGH (ref 70–99)
Potassium: 3.7 mmol/L (ref 3.5–5.1)
Sodium: 136 mmol/L (ref 135–145)
Total Bilirubin: 0.4 mg/dL (ref 0.3–1.2)
Total Protein: 7.4 g/dL (ref 6.5–8.1)

## 2022-07-27 LAB — CBC WITH DIFFERENTIAL/PLATELET
Abs Immature Granulocytes: 0.01 10*3/uL (ref 0.00–0.07)
Basophils Absolute: 0.1 10*3/uL (ref 0.0–0.1)
Basophils Relative: 1 %
Eosinophils Absolute: 0.1 10*3/uL (ref 0.0–0.5)
Eosinophils Relative: 1 %
HCT: 32.8 % — ABNORMAL LOW (ref 36.0–46.0)
Hemoglobin: 11.1 g/dL — ABNORMAL LOW (ref 12.0–15.0)
Immature Granulocytes: 0 %
Lymphocytes Relative: 45 %
Lymphs Abs: 2.2 10*3/uL (ref 0.7–4.0)
MCH: 31.8 pg (ref 26.0–34.0)
MCHC: 33.8 g/dL (ref 30.0–36.0)
MCV: 94 fL (ref 80.0–100.0)
Monocytes Absolute: 0.3 10*3/uL (ref 0.1–1.0)
Monocytes Relative: 6 %
Neutro Abs: 2.3 10*3/uL (ref 1.7–7.7)
Neutrophils Relative %: 47 %
Platelets: 200 10*3/uL (ref 150–400)
RBC: 3.49 MIL/uL — ABNORMAL LOW (ref 3.87–5.11)
RDW: 17.6 % — ABNORMAL HIGH (ref 11.5–15.5)
WBC: 4.9 10*3/uL (ref 4.0–10.5)
nRBC: 0 % (ref 0.0–0.2)

## 2022-07-27 NOTE — Progress Notes (Signed)
[facility]  Telephone:([phone] [phone]  Patient Care Team: Burnard Hawthorne, FNP as PCP - General (Family Medicine) Kate Sable, MD as PCP - Cardiology (Cardiology) Rico Junker, RN as Oncology Nurse Navigator Sindy Guadeloupe, MD as Consulting Physician (Hematology and Oncology) Gloris Ham, RN as Registered Nurse (Oncology) Borders, Kirt Boys, NP as Nurse Practitioner [facility] and Palliative Medicine)   Name of the patient: Tricia Berry  [id number]  12-Sep-1971   Date of visit: 07/27/22  HPI: Patient is a 51 y.o. female with stage III ER/PR positive, HER2 negative breast cancer. She is on Verzenio (abemaciclib) for the adjuvant treatment for a planned duration of 2 years. Abemaciclib was started on 05/11/22. She also takes anastrozole.   Reason for Consult: Oral chemotherapy follow-up for abemaciclib therapy.   PAST MEDICAL HISTORY: Past Medical History:  Diagnosis Date   Anxiety    Breast cancer ([place])    Diverticulitis    Family history of ovarian cancer    GERD (gastroesophageal reflux disease)    IBS (irritable bowel syndrome)    PONV (postoperative nausea and vomiting)    severe migraine and vomiting post anesthesia   Scoliosis     HEMATOLOGY/ONCOLOGY HISTORY:  Oncology History  Malignant neoplasm of upper-outer quadrant of left breast in female, estrogen receptor positive ([place])  09/26/2020 Initial Diagnosis   Malignant neoplasm of upper-outer quadrant of left breast in female, estrogen receptor positive ([place])   10/08/2020 - 02/18/2021 Chemotherapy         10/08/2020 Cancer Staging   Staging form: Breast, AJCC 8th Edition - Clinical stage from 10/08/2020: Stage IIIB (cT4, cN1, cM0, G2, ER+, PR+, HER2-) - Signed by Sindy Guadeloupe, MD on 10/08/2020    Genetic Testing   Negative genetic testing. No pathogenic variants identified on the [facility] Common Hereditary Cancers Panel. The  report date is 11/18/2020.  The Multi-Cancer Panel offered by [facility] includes sequencing and/or deletion duplication testing of the following 84 genes: AIP, ALK, APC, ATM, AXIN2,BAP1,  BARD1, BLM, BMPR1A, BRCA1, BRCA2, BRIP1, CASR, CDC73, CDH1, CDK4, CDKN1B, CDKN1C, CDKN2A (p14ARF), CDKN2A (p16INK4a), CEBPA, CHEK2, CTNNA1, DICER1, DIS3L2, EGFR (c.2369C>T, p.Thr790Met variant only), EPCAM (Deletion/duplication testing only), FH, FLCN, GATA2, GPC3, GREM1 (Promoter region deletion/duplication testing only), HOXB13 (c.251G>A, p.Gly84Glu), HRAS, KIT, MAX, MEN1, MET, MITF (c.952G>A, p.Glu318Lys variant only), MLH1, MSH2, MSH3, MSH6, MUTYH, NBN, NF1, NF2, NTHL1, PALB2, PDGFRA, PHOX2B, PMS2, POLD1, POLE, POT1, PRKAR1A, PTCH1, PTEN, RAD50, RAD51C, RAD51D, RB1, RECQL4, RET,  RUNX1, SDHAF2, SDHA (sequence changes only), SDHB, SDHC, SDHD, SMAD4, SMARCA4, SMARCB1, SMARCE1, STK11, SUFU, TERC, TERT, TMEM127, TP53, TSC1, TSC2, VHL, WRN and WT1.    04/11/2021 Cancer Staging   Staging form: Breast, AJCC 8th Edition - Pathologic stage from 04/11/2021: No Stage Recommended (ypT2, pN2a, cM0, G2, ER+, PR+, HER2+) - Signed by Sindy Guadeloupe, MD on 04/11/2021 Stage prefix: Post-therapy Histologic grading system: 3 grade system   04/27/2021 - 06/10/2021 Chemotherapy         07/01/2021 - 06/07/2022 Chemotherapy   Patient is on Treatment Plan : BREAST Weekly Paclitaxel + Trastuzumab + Pertuzumab q21d x 8 cycles / Trastuzumab + Pertuzumab q21d x 4 cycles     06/28/2022 -  Chemotherapy   Patient is on Treatment Plan : BREAST Trastuzumab  + Pertuzumab q21d x 13 cycles       ALLERGIES:  is allergic to morphine, sertraline hcl, sulfa antibiotics, sulfamethoxazole, and sulfonamide derivatives.  MEDICATIONS:  Current Outpatient Medications  Medication  Sig Dispense Refill   abemaciclib (VERZENIO) 100 MG tablet Take 1 tablet (100 mg total) by mouth 2 (two) times daily. 56 tablet 1   Alpha-Lipoic Acid 600 MG CAPS Take 1 capsule (600  mg total) by mouth daily. 30 capsule 0   anastrozole (ARIMIDEX) 1 MG tablet Take 1 tablet (1 mg total) by mouth daily. 30 tablet 3   CALCIUM-VITAMIN D PO Take 1 tablet by mouth daily at 12 noon.     cyanocobalamin (VITAMIN B12) 1000 MCG/ML injection 1000 mcg (1 mL) intramuscular injection in the thigh ( vastus lateralis) once per month. 3 mL 4   FLUoxetine (PROZAC) 20 MG capsule Take 1 capsule (20 mg total) by mouth every morning. 90 capsule 3   gentamicin ointment (GARAMYCIN) 0.1 % Apply to nose three times daily for 2 weeks then as needed 30 g 12   Lactobacillus (PROBIOTIC ACIDOPHILUS PO) Take 1 capsule by mouth daily.     Multiple Vitamins-Minerals (MULTIVITAL PO) Take 1 Dose by mouth daily.     nicotine (NICODERM CQ - DOSED IN MG/24 HOURS) 21 mg/24hr patch use 1 patch daily 28 patch 0   ondansetron (ZOFRAN-ODT) 8 MG disintegrating tablet Take 1 tablet (8 mg total) by mouth every 8 (eight) hours as needed for nausea or vomiting. 45 tablet 2   pregabalin (LYRICA) 150 MG capsule Take 1 capsule (150 mg total) by mouth in the morning, at noon, and at bedtime. 90 capsule 2   rosuvastatin (CRESTOR) 5 MG tablet Take 1 tablet (5 mg total) by mouth every evening. (Patient taking differently: Take 5 mg by mouth every evening. Taking twice weekly) 90 tablet 3   trimethoprim (TRIMPEX) 100 MG tablet Take 1 tablet (100 mg total) by mouth daily. 90 tablet 3   VITAMIN D PO Take 2,000 Int'l Units/day by mouth daily at 12 noon.     Oxycodone HCl 10 MG TABS Take 1 tablet (10 mg total) by mouth daily. Must last 30 days. 30 tablet 0   tiZANidine (ZANAFLEX) 4 MG tablet Take 1 tablet (4 mg total) by mouth 3 (three) times daily. (Patient not taking: Reported on 07/27/2022) 30 tablet 0   No current facility-administered medications for this visit.   Facility-Administered Medications Ordered in Other Visits  Medication Dose Route Frequency Provider Last Rate Last Admin   acetaminophen (TYLENOL) 325 MG tablet             diphenhydrAMINE (BENADRYL) 25 mg capsule            goserelin (ZOLADEX) injection 3.6 mg  3.6 mg Subcutaneous Q28 days Sindy Guadeloupe, MD   3.6 mg at 05/04/21 1150   heparin lock flush 100 unit/mL  500 Units Intravenous Once Sindy Guadeloupe, MD       Zoledronic Acid (ZOMETA) 4 MG/100ML IVPB             VITAL SIGNS: BP 92/60   Pulse 73   Temp 98.5 F (36.9 C) (Oral)   Resp 16   Ht 5' (1.524 m)   Wt 52.5 kg (115 lb 12.8 oz)   BMI 22.62 kg/m  Filed Weights   07/27/22 0931  Weight: 52.5 kg (115 lb 12.8 oz)    Estimated body mass index is 22.62 kg/m as calculated from the following:   Height as of this encounter: 5' (1.524 m).   Weight as of this encounter: 52.5 kg (115 lb 12.8 oz).  LABS: CBC:    Component Value Date/Time   WBC  4.9 07/27/2022 0855   HGB 11.1 (L) 07/27/2022 0855   HGB 14.3 08/23/2020 1033   HCT 32.8 (L) 07/27/2022 0855   HCT 42.9 08/23/2020 1033   PLT 200 07/27/2022 0855   PLT 222 08/23/2020 1033   MCV 94.0 07/27/2022 0855   MCV 91 08/23/2020 1033   NEUTROABS 2.3 07/27/2022 0855   NEUTROABS 2.7 08/23/2020 1033   LYMPHSABS 2.2 07/27/2022 0855   LYMPHSABS 4.2 (H) 08/23/2020 1033   MONOABS 0.3 07/27/2022 0855   EOSABS 0.1 07/27/2022 0855   EOSABS 0.1 08/23/2020 1033   BASOSABS 0.1 07/27/2022 0855   BASOSABS 0.1 08/23/2020 1033   Comprehensive Metabolic Panel:    Component Value Date/Time   NA 136 07/27/2022 0855   K 3.7 07/27/2022 0855   CL 101 07/27/2022 0855   CO2 27 07/27/2022 0855   BUN 9 07/27/2022 0855   CREATININE 1.13 (H) 07/27/2022 0855   GLUCOSE 132 (H) 07/27/2022 0855   CALCIUM 9.3 07/27/2022 0855   AST 20 07/27/2022 0855   ALT 15 07/27/2022 0855   ALKPHOS 58 07/27/2022 0855   BILITOT 0.4 07/27/2022 0855   PROT 7.4 07/27/2022 0855   ALBUMIN 3.6 07/27/2022 0855     Present during today's visit: patient only  Assessment and Plan: CBC/CMP reviewed, continue abemaciclib 137m bid   Oral Chemotherapy Side Effect/Intolerance:   Diarrhea: patient reports have diarrhea every day and no normal stools. She is hesitant to use loperamide due to concerns for constipation. Discussed using lopermide liquid so the she could try taking half a dose of loperamide. Nausea: comes and goes, patient estimates that she needs a ondansetron dose every other day. Discussed premedicating her abemaciclib dose if she notices a pattern in the nausea timing Fatigue: ongoing, but not as bad as when she was on the 1560mdosing. Encouraged patient to stay as active as possible  Other: Cramping: patient reports cramping in feet, left hand, back, and abdomin. This is not a common and rarely reported abemaciclib from what I could find. Patient reports not drinking much water, encouraged her to increase her water intake to see if this helps with her cramps. Of note, potassium and calcium today wnl. Mag was last checked on 05/23/22 and was wnl. Order entered to recheck this at her next lab check in Nov.   Oral Chemotherapy Adherence: No missed doses reported No patient barriers to medication adherence identified.   New medications: None reported  Medication Access Issues: [facility]  Patient expressed understanding and was in agreement with this plan. She also understands that She can call clinic at any time with any questions, concerns, or complaints.   Follow-up plan: RTC for medonc visit in one month  Thank you for allowing me to participate in the care of this very pleasant patient.   Time Total: 15 mins  Visit consisted of counseling and education on dealing with issues of symptom management in the setting of serious and potentially life-threatening illness.Greater than 50%  of this time was spent counseling and coordinating care related to the above assessment and plan.  Signed by: AlDarl PikesPharmD, BCPS, BCSalley SlaughterCPP Hematology/Oncology Clinical Pharmacist  Practitioner [place]/DB/AP [phone]/02/2022 10:19 AM

## 2022-07-27 NOTE — Progress Notes (Signed)
Pt is tired, has muscle aches. She has belly cramps and her hands and feet can have spasms and then it goes away

## 2022-07-28 ENCOUNTER — Ambulatory Visit
Admission: RE | Admit: 2022-07-28 | Discharge: 2022-07-28 | Disposition: A | Discharge status: Home / Self Care | Payer: No Typology Code available for payment source | Source: Ambulatory Visit | Attending: Internal Medicine | Admitting: Internal Medicine

## 2022-07-28 ENCOUNTER — Other Ambulatory Visit: Payer: Self-pay

## 2022-07-28 DIAGNOSIS — C7931 Secondary malignant neoplasm of brain: Secondary | ICD-10-CM | POA: Diagnosis present

## 2022-07-28 LAB — FSH/LH
FSH: 22 m[IU]/mL
LH: 10.8 m[IU]/mL

## 2022-07-28 LAB — ESTRADIOL: Estradiol: 29.5 pg/mL

## 2022-07-28 MED ORDER — GADOBUTROL 1 MMOL/ML IV SOLN
5.0000 mL | Freq: Once | INTRAVENOUS | Status: AC | PRN
  Administered 2022-07-28: 7.5 mL via INTRAVENOUS

## 2022-08-01 ENCOUNTER — Ambulatory Visit (INDEPENDENT_AMBULATORY_CARE_PROVIDER_SITE_OTHER): Payer: No Typology Code available for payment source | Admitting: Family

## 2022-08-01 ENCOUNTER — Other Ambulatory Visit (HOSPITAL_COMMUNITY): Payer: Self-pay

## 2022-08-01 ENCOUNTER — Other Ambulatory Visit: Payer: Self-pay

## 2022-08-01 VITALS — BP 138/78 | HR 70 | Temp 98.1°F | Ht 60.0 in | Wt 115.8 lb

## 2022-08-01 DIAGNOSIS — T451X5A Adverse effect of antineoplastic and immunosuppressive drugs, initial encounter: Secondary | ICD-10-CM | POA: Diagnosis not present

## 2022-08-01 DIAGNOSIS — Z23 Encounter for immunization: Secondary | ICD-10-CM | POA: Diagnosis not present

## 2022-08-01 DIAGNOSIS — G620 Drug-induced polyneuropathy: Secondary | ICD-10-CM | POA: Diagnosis not present

## 2022-08-01 DIAGNOSIS — F411 Generalized anxiety disorder: Secondary | ICD-10-CM

## 2022-08-01 MED ORDER — LORAZEPAM 0.5 MG PO TABS
0.5000 mg | ORAL_TABLET | Freq: Every day | ORAL | 2 refills | Status: DC
  Filled 2022-08-01: qty 30, 30d supply, fill #0
  Filled 2022-08-29 (×2): qty 30, 30d supply, fill #1
  Filled 2022-09-26: qty 30, 30d supply, fill #2

## 2022-08-01 NOTE — Progress Notes (Signed)
Subjective:    Patient ID: Tricia Berry, female    DOB: 1971/03/26, 51 y.o.   MRN: [id number]  CC: Tricia Berry is a 51 y.o. female who presents today for follow up.   HPI: HPI  No alcohol use.    Trial stop of crestor due to arthralgia  Chemotherapy-induced neuropathy-compliant with lyrica `50 mg 3 times daily, B12 She is now taking oxycodone 10omg  once per day.   Anxiety-decrease Prozac 40 mg due to jerking sensation of hands and tremor has stopped.   She is compliant with trazodone 25 mg to aid with sleep.   Left breast cancer and following with Dr. Janese Banks.  Last visit 06/28/2022. She is no longer on zoladex. Compliant with verzenio.   Following with Dr. 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 Anxiety   . Breast cancer ([place])   . Diverticulitis   . Family history of ovarian cancer   . GERD (gastroesophageal reflux disease)   . IBS (irritable bowel syndrome)   . PONV (postoperative nausea and vomiting)    severe migraine and vomiting post anesthesia  . Scoliosis    Past Surgical History:  Procedure Laterality Date  . ABDOMINAL HYSTERECTOMY     still has ovaries, no gyn cancer, hysterectomy due to endometriosis. NO cervix on exam 01/10/21  . BREAST BIOPSY Left 09/15/2020   [place] bx of mass, path pending, Q marker  . BREAST BIOPSY Left 09/15/2020   [place] bx of LN, hydromarker, path pending  . BREAST RECONSTRUCTION WITH PLACEMENT OF TISSUE EXPANDER AND FLEX HD (ACELLULAR HYDRATED DERMIS) Bilateral 03/17/2021   Procedure: IMMEDIATE BILATERAL BREAST RECONSTRUCTION WITH PLACEMENT OF TISSUE EXPANDER AND FLEX HD (ACELLULAR HYDRATED DERMIS);  Surgeon: Wallace Going, DO;  Location: [place];  Service: Plastics;  Laterality: Bilateral;  . COLONOSCOPY WITH PROPOFOL N/A 11/21/2021   Procedure: COLONOSCOPY WITH PROPOFOL;  Surgeon: Lin Landsman, MD;  Location: [facility] ENDOSCOPY;  Service:  Gastroenterology;  Laterality: N/A;  . IR IMAGING GUIDED PORT INSERTION  10/01/2020  . MODIFIED MASTECTOMY Left 03/17/2021   Procedure: LEFT MODIFIED RADICAL MASTECTOMY;  Surgeon: Erroll Luna, MD;  Location: [place];  Service: General;  Laterality: Left;  . PORTA CATH REMOVAL Right 03/17/2021   Procedure: PORTA CATH REMOVAL;  Surgeon: Erroll Luna, MD;  Location: [place];  Service: General;  Laterality: Right;  . REMOVAL OF BILATERAL TISSUE EXPANDERS WITH PLACEMENT OF BILATERAL BREAST IMPLANTS Bilateral 05/09/2021   Procedure: REMOVAL OF BILATERAL TISSUE EXPANDERS WITH PLACEMENT OF BILATERAL BREAST IMPLANTS;  Surgeon: Wallace Going, DO;  Location: [place];  Service: Plastics;  Laterality: Bilateral;  90 min  . TOTAL MASTECTOMY Right 03/17/2021   Procedure: TOTAL MASTECTOMY;  Surgeon: Erroll Luna, MD;  Location: [place];  Service: General;  Laterality: Right;   Family History  Problem Relation Age of Onset  . Hypertension Mother   . Osteoarthritis Mother   . Diverticulitis Mother   . Heart failure Father   . Hypertension Father   . Gout Father   . Heart attack Father 58  . Diverticulitis Brother   . Ovarian cancer Paternal Grandmother     Allergies: Morphine, Sertraline hcl, Sulfa antibiotics, Sulfamethoxazole, and Sulfonamide derivatives Current Outpatient Medications on File Prior to Visit  Medication Sig Dispense Refill  . abemaciclib (VERZENIO) 100 MG tablet Take 1 tablet (100 mg total) by mouth 2 (  two) times daily. 56 tablet 1  . Alpha-Lipoic Acid 600 MG CAPS Take 1 capsule (600 mg total) by mouth daily. 30 capsule 0  . anastrozole (ARIMIDEX) 1 MG tablet Take 1 tablet (1 mg total) by mouth daily. 30 tablet 3  . CALCIUM-VITAMIN D PO Take 1 tablet by mouth daily at 12 noon.    . cyanocobalamin (VITAMIN B12) 1000 MCG/ML injection 1000 mcg (1 mL) intramuscular injection in the thigh ( vastus  lateralis) once per month. 3 mL 4  . FLUoxetine (PROZAC) 20 MG capsule Take 1 capsule (20 mg total) by mouth every morning. 90 capsule 3  . gentamicin ointment (GARAMYCIN) 0.1 % Apply to nose three times daily for 2 weeks then as needed 30 g 12  . Lactobacillus (PROBIOTIC ACIDOPHILUS PO) Take 1 capsule by mouth daily.    . Multiple Vitamins-Minerals (MULTIVITAL PO) Take 1 Dose by mouth daily.    . nicotine (NICODERM CQ - DOSED IN MG/24 HOURS) 21 mg/24hr patch use 1 patch daily 28 patch 0  . ondansetron (ZOFRAN-ODT) 8 MG disintegrating tablet Take 1 tablet (8 mg total) by mouth every 8 (eight) hours as needed for nausea or vomiting. 45 tablet 2  . Oxycodone HCl 10 MG TABS Take 1 tablet (10 mg total) by mouth daily. Must last 30 days. 30 tablet 0  . pregabalin (LYRICA) 150 MG capsule Take 1 capsule (150 mg total) by mouth in the morning, at noon, and at bedtime. 90 capsule 2  . rosuvastatin (CRESTOR) 5 MG tablet Take 1 tablet (5 mg total) by mouth every evening. (Patient taking differently: Take 5 mg by mouth every evening. Taking twice weekly) 90 tablet 3  . tiZANidine (ZANAFLEX) 4 MG tablet Take 1 tablet (4 mg total) by mouth 3 (three) times daily. 30 tablet 0  . trimethoprim (TRIMPEX) 100 MG tablet Take 1 tablet (100 mg total) by mouth daily. 90 tablet 3  . VITAMIN D PO Take 2,000 Int'l Units/day by mouth daily at 12 noon.     Current Facility-Administered Medications on File Prior to Visit  Medication Dose Route Frequency Provider Last Rate Last Admin  . acetaminophen (TYLENOL) 325 MG tablet           . diphenhydrAMINE (BENADRYL) 25 mg capsule           . goserelin (ZOLADEX) injection 3.6 mg  3.6 mg Subcutaneous Q28 days Sindy Guadeloupe, MD   3.6 mg at 05/04/21 1150  . heparin lock flush 100 unit/mL  500 Units Intravenous Once Sindy Guadeloupe, MD      . Zoledronic Acid (ZOMETA) 4 MG/100ML IVPB             Social History   Tobacco Use  . Smoking status: Some Days    Packs/day: 0.25     Types: Cigarettes  . Smokeless tobacco: Never  . Tobacco comments:    Pt trying to quit now. Has reduced amount significantly  Vaping Use  . Vaping Use: Never used  Substance Use Topics  . Alcohol use: Not Currently  . Drug use: No    Review of Systems    Objective:    BP 138/78   Pulse 70   Temp 98.1 F (36.7 C) (Oral)   Ht 5' (1.524 m)   Wt 115 lb 12.8 oz (52.5 kg)   SpO2 96%   BMI 22.62 kg/m  BP Readings from Last 3 Encounters:  08/01/22 138/78  07/27/22 92/60  07/19/22 117/82   Wt  Readings from Last 3 Encounters:  08/01/22 115 lb 12.8 oz (52.5 kg)  07/27/22 115 lb 12.8 oz (52.5 kg)  07/19/22 116 lb 8.2 oz (52.9 kg)    Physical Exam     Assessment & Plan:   Problem List Items Addressed This Visit   None    I am having Tricia Berry "Tricia Berry" maintain her Multiple Vitamins-Minerals (MULTIVITAL PO), Lactobacillus (PROBIOTIC ACIDOPHILUS PO), rosuvastatin, nicotine, cyanocobalamin, tiZANidine, trimethoprim, anastrozole, FLUoxetine, VITAMIN D PO, CALCIUM-VITAMIN D PO, ondansetron, abemaciclib, pregabalin, Oxycodone HCl, Alpha-Lipoic Acid, and gentamicin ointment.   No orders of the defined types were placed in this encounter.   Return precautions given.   Risks, benefits, and alternatives of the medications and treatment plan prescribed today were discussed, and patient expressed understanding.   Education regarding symptom management and diagnosis given to patient on AVS.  Continue to follow with Burnard Hawthorne, FNP for routine health maintenance.   Alden Benjamin Lutzke and I agreed with plan.   Mable Paris, FNP

## 2022-08-01 NOTE — Patient Instructions (Addendum)
You may trial Patanol ophthalmalgic eye drops for allergies.   B complex vitamin for nerve pain  Nice to you, always.

## 2022-08-02 ENCOUNTER — Encounter: Payer: Self-pay | Admitting: Family

## 2022-08-02 ENCOUNTER — Ambulatory Visit
Admission: RE | Admit: 2022-08-02 | Discharge: 2022-08-02 | Disposition: A | Discharge status: Home / Self Care | Payer: No Typology Code available for payment source | Source: Ambulatory Visit | Attending: Radiation Oncology | Admitting: Radiation Oncology

## 2022-08-02 ENCOUNTER — Telehealth: Payer: Self-pay

## 2022-08-02 VITALS — BP 112/93 | HR 75 | Temp 95.9°F | Resp 16 | Ht 60.0 in | Wt 116.2 lb

## 2022-08-02 DIAGNOSIS — Z79811 Long term (current) use of aromatase inhibitors: Secondary | ICD-10-CM | POA: Diagnosis not present

## 2022-08-02 DIAGNOSIS — Z170 Estrogen receptor positive status [ER+]: Secondary | ICD-10-CM | POA: Insufficient documentation

## 2022-08-02 DIAGNOSIS — C50412 Malignant neoplasm of upper-outer quadrant of left female breast: Secondary | ICD-10-CM | POA: Insufficient documentation

## 2022-08-02 DIAGNOSIS — Z923 Personal history of irradiation: Secondary | ICD-10-CM | POA: Insufficient documentation

## 2022-08-02 NOTE — Progress Notes (Signed)
Radiation Oncology Follow up Note  Name: Tricia Berry   Date:   08/02/2022 MRN:  [id number] DOB: 07/28/1971    This 51 y.o. 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  REFERRING PROVIDER: Arnett, Margaret G, FNP  HPI: Patient is a 51-year-old female now out 1 year having completed radiation therapy to her left breast and peripheral lymphatics for stage III ER/PR positive invasive mammary carcinoma seen today in routine follow-up she is doing well.  She specifically denies any chest wall tenderness bone pain or cough.  She is not having mammograms which I think is fine based on her bilateral mastectomies.  She will discuss that with Dr. Rao..  She is currently on Arimidex tolerating well without side effect.  She does have a 2 mm focus of enhancement in the medial posterior right frontal lobe which is stable remains concerning for focal metastasis.  COMPLICATIONS OF TREATMENT: none  FOLLOW UP COMPLIANCE: keeps appointments   PHYSICAL EXAM:  BP (!) 112/93   Pulse 75   Temp (!) 95.9 F (35.5 C)   Resp 16   Ht 5' (1.524 m)   Wt 116 lb 3.2 oz (52.7 kg)   BMI 22.69 kg/m  Patient's had bilateral reconstruction.  No evidence of mass or nodularity is noted in either chest.  No axillary or supraclavicular adenopathy is appreciated.  Well-developed well-nourished patient in NAD. HEENT reveals PERLA, EOMI, discs not visualized.  Oral cavity is clear. No oral mucosal lesions are identified. Neck is clear without evidence of cervical or supraclavicular adenopathy. Lungs are clear to A&P. Cardiac examination is essentially unremarkable with regular rate and rhythm without murmur rub or thrill. Abdomen is benign with no organomegaly or masses noted.  Motor sensory and DTR levels are equal and symmetric in the upper and lower extremities. Cranial nerves II through XII are grossly intact. Proprioception is intact. No peripheral adenopathy or edema is identified. No motor or sensory levels are noted. Crude visual fields are within normal range.  RADIOLOGY RESULTS: Brain MRI reviewed compatible with above-stated findings  PLAN: At this time would not act on her brain finding since they are stable.  I am pleased with her overall progress.  She continues close follow-up care with medical oncology.  Of asked to see her back in 1 year for follow-up.  Patient is to call with any concerns.  I would like to take this opportunity to thank you for allowing me to participate in the care of your patient..    Glenn Chrystal, MD  

## 2022-08-02 NOTE — Assessment & Plan Note (Addendum)
Chronic, stable.  Continue Lyrica 150 mg 3 times daily and B12 PO.

## 2022-08-02 NOTE — Telephone Encounter (Signed)
Received fax requesting additional information for same Cpt code 19370 and 19380 that was authorized under case ID [id number]. New case [id number].Only need to extend date through December. Someone from clinical will call back. Patient has tele-visit with Catalina Antigua 10/17 to see if patient is finished with injections and ready to schedule surgery.

## 2022-08-02 NOTE — Assessment & Plan Note (Signed)
Chronic, stable.  Continue Prozac 40 mg, trazodone 25 mg

## 2022-08-03 ENCOUNTER — Other Ambulatory Visit: Payer: Self-pay

## 2022-08-04 ENCOUNTER — Other Ambulatory Visit: Payer: No Typology Code available for payment source

## 2022-08-04 ENCOUNTER — Ambulatory Visit: Payer: No Typology Code available for payment source

## 2022-08-04 ENCOUNTER — Ambulatory Visit: Payer: No Typology Code available for payment source | Admitting: Oncology

## 2022-08-04 ENCOUNTER — Encounter: Payer: Self-pay | Admitting: Internal Medicine

## 2022-08-04 ENCOUNTER — Inpatient Hospital Stay (HOSPITAL_BASED_OUTPATIENT_CLINIC_OR_DEPARTMENT_OTHER): Payer: No Typology Code available for payment source | Admitting: Internal Medicine

## 2022-08-04 ENCOUNTER — Other Ambulatory Visit: Payer: Self-pay

## 2022-08-04 VITALS — BP 128/79 | HR 87 | Temp 97.6°F | Resp 16 | Wt 117.0 lb

## 2022-08-04 DIAGNOSIS — C7931 Secondary malignant neoplasm of brain: Secondary | ICD-10-CM

## 2022-08-04 DIAGNOSIS — G620 Drug-induced polyneuropathy: Secondary | ICD-10-CM

## 2022-08-04 DIAGNOSIS — C50412 Malignant neoplasm of upper-outer quadrant of left female breast: Secondary | ICD-10-CM | POA: Diagnosis not present

## 2022-08-04 DIAGNOSIS — I890 Lymphedema, not elsewhere classified: Secondary | ICD-10-CM | POA: Diagnosis not present

## 2022-08-04 DIAGNOSIS — T451X5A Adverse effect of antineoplastic and immunosuppressive drugs, initial encounter: Secondary | ICD-10-CM

## 2022-08-04 MED ORDER — AMITRIPTYLINE HCL 50 MG PO TABS
50.0000 mg | ORAL_TABLET | Freq: Every day | ORAL | 1 refills | Status: DC
  Filled 2022-08-04: qty 60, 60d supply, fill #0

## 2022-08-04 MED ORDER — BACLOFEN 10 MG PO TABS
10.0000 mg | ORAL_TABLET | Freq: Three times a day (TID) | ORAL | 1 refills | Status: DC | PRN
  Filled 2022-08-04: qty 15, 5d supply, fill #0
  Filled 2022-08-16: qty 15, 5d supply, fill #1

## 2022-08-04 NOTE — Progress Notes (Signed)
Pt reports had nosebleed daily x 4 days.  States having more spasms in right leg as well as left hand is more difficult to open and close.

## 2022-08-04 NOTE — Progress Notes (Signed)
[facility] [place], [place] [address] [phone]   Interval Evaluation  Date of Service: 08/04/22 Patient Name: Tricia Berry Patient MRN: [id number] Patient DOB: 08-Apr-1971 Provider: Ventura Sellers, MD  Identifying Statement:  Tricia Berry is a 51 y.o. female with 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[place])   Primary Cancer:  Oncologic History: Oncology History  Malignant neoplasm of upper-outer quadrant of left breast in female, estrogen receptor positive ([place])  09/26/2020 Initial Diagnosis   Malignant neoplasm of upper-outer quadrant of left breast in female, estrogen receptor positive ([place])   10/08/2020 - 02/18/2021 Chemotherapy         10/08/2020 Cancer Staging   Staging form: Breast, AJCC 8th Edition - Clinical stage from 10/08/2020: Stage IIIB (cT4, cN1, cM0, G2, ER+, PR+, HER2-) - Signed by Sindy Guadeloupe, MD on 10/08/2020    Genetic Testing   Negative genetic testing. No pathogenic variants identified on the [facility] Common Hereditary Cancers Panel. The report date is 11/18/2020.  The Multi-Cancer Panel offered by [facility] includes sequencing and/or deletion duplication testing of the following 84 genes: AIP, ALK, APC, ATM, AXIN2,BAP1,  BARD1, BLM, BMPR1A, BRCA1, BRCA2, BRIP1, CASR, CDC73, CDH1, CDK4, CDKN1B, CDKN1C, CDKN2A (p14ARF), CDKN2A (p16INK4a), CEBPA, CHEK2, CTNNA1, DICER1, DIS3L2, EGFR (c.2369C>T, p.Thr790Met variant only), EPCAM (Deletion/duplication testing only), FH, FLCN, GATA2, GPC3, GREM1 (Promoter region deletion/duplication testing only), HOXB13 (c.251G>A, p.Gly84Glu), HRAS, KIT, MAX, MEN1, MET, MITF (c.952G>A, p.Glu318Lys variant only), MLH1, MSH2, MSH3, MSH6, MUTYH, NBN, NF1, NF2, NTHL1, PALB2, PDGFRA, PHOX2B, PMS2, POLD1, POLE, POT1, PRKAR1A, PTCH1, PTEN, RAD50, RAD51C, RAD51D, RB1, RECQL4, RET,  RUNX1, SDHAF2, SDHA (sequence changes only), SDHB, SDHC, SDHD, SMAD4, SMARCA4, SMARCB1, SMARCE1,  STK11, SUFU, TERC, TERT, TMEM127, TP53, TSC1, TSC2, VHL, WRN and WT1.    04/11/2021 Cancer Staging   Staging form: Breast, AJCC 8th Edition - Pathologic stage from 04/11/2021: No Stage Recommended (ypT2, pN2a, cM0, G2, ER+, PR+, HER2+) - Signed by Sindy Guadeloupe, MD on 04/11/2021 Stage prefix: Post-therapy Histologic grading system: 3 grade system   04/27/2021 - 06/10/2021 Chemotherapy         07/01/2021 - 06/07/2022 Chemotherapy   Patient is on Treatment Plan : BREAST Weekly Paclitaxel + Trastuzumab + Pertuzumab q21d x 8 cycles / Trastuzumab + Pertuzumab q21d x 4 cycles     06/28/2022 -  Chemotherapy   Patient is on Treatment Plan : BREAST Trastuzumab  + Pertuzumab q21d x 13 cycles       Interval History: Tricia Berry presents today for follow up after recent MRI brain.  She doesn't describe any improvements with neuropathy symptoms.  Complains of difficulty manipulating her phone and opening jars.  She has completed HER2 infusions, now on verzenio.  No other new or progressive deficits.  H+P (06/02/22) Patient presents today to review brain MRI, as well as neuropathic symptoms.  She describes >1 year history of tingling, pain, burning, cold sensitivity affecting her feet and hands.  Pain is severe in particular at night, with burning in her left big toe keeping her awake.  In addition she describes more chronic aching pain in her lower back.  She also complains of feelings of imbalance, but no falls and no walking aid.  This does not appear to be getting worse in recent months since taxol was discontinued.   She otherwise denies focal neurologic deficits, no focal weakness, numbness, double vision, slurred speech.  No seizures.  Medications: Current Outpatient Medications on File Prior to  Visit  Medication Sig Dispense Refill   abemaciclib (VERZENIO) 100 MG tablet Take 1 tablet (100 mg total) by mouth 2 (two) times daily. 56 tablet 1   Alpha-Lipoic Acid 600 MG CAPS Take 1 capsule (600 mg  total) by mouth daily. 30 capsule 0   anastrozole (ARIMIDEX) 1 MG tablet Take 1 tablet (1 mg total) by mouth daily. 30 tablet 3   CALCIUM-VITAMIN D PO Take 1 tablet by mouth daily at 12 noon.     cyanocobalamin (VITAMIN B12) 1000 MCG/ML injection 1000 mcg (1 mL) intramuscular injection in the thigh ( vastus lateralis) once per month. 3 mL 4   FLUoxetine (PROZAC) 20 MG capsule Take 1 capsule (20 mg total) by mouth every morning. 90 capsule 3   gentamicin ointment (GARAMYCIN) 0.1 % Apply to nose three times daily for 2 weeks then as needed 30 g 12   Lactobacillus (PROBIOTIC ACIDOPHILUS PO) Take 1 capsule by mouth daily.     LORazepam (ATIVAN) 0.5 MG tablet Take 1 tablet (0.5 mg total) by mouth at bedtime. 30 tablet 2   Multiple Vitamins-Minerals (MULTIVITAL PO) Take 1 Dose by mouth daily.     nicotine (NICODERM CQ - DOSED IN MG/24 HOURS) 21 mg/24hr patch use 1 patch daily 28 patch 0   ondansetron (ZOFRAN-ODT) 8 MG disintegrating tablet Take 1 tablet (8 mg total) by mouth every 8 (eight) hours as needed for nausea or vomiting. 45 tablet 2   Oxycodone HCl 10 MG TABS Take 1 tablet (10 mg total) by mouth daily. Must last 30 days. 30 tablet 0   pregabalin (LYRICA) 150 MG capsule Take 1 capsule (150 mg total) by mouth in the morning, at noon, and at bedtime. 90 capsule 2   rosuvastatin (CRESTOR) 5 MG tablet Take 1 tablet (5 mg total) by mouth every evening. (Patient taking differently: Take 5 mg by mouth every evening. Taking twice weekly) 90 tablet 3   tiZANidine (ZANAFLEX) 4 MG tablet Take 1 tablet (4 mg total) by mouth 3 (three) times daily. 30 tablet 0   trimethoprim (TRIMPEX) 100 MG tablet Take 1 tablet (100 mg total) by mouth daily. 90 tablet 3   VITAMIN D PO Take 2,000 Int'l Units/day by mouth daily at 12 noon.     Current Facility-Administered Medications on File Prior to Visit  Medication Dose Route Frequency Provider Last Rate Last Admin   acetaminophen (TYLENOL) 325 MG tablet             diphenhydrAMINE (BENADRYL) 25 mg capsule            goserelin (ZOLADEX) injection 3.6 mg  3.6 mg Subcutaneous Q28 days Sindy Guadeloupe, MD   3.6 mg at 05/04/21 1150   heparin lock flush 100 unit/mL  500 Units Intravenous Once Sindy Guadeloupe, MD       Zoledronic Acid (ZOMETA) 4 MG/100ML IVPB             Allergies:  Allergies  Allergen Reactions   Morphine Nausea And Vomiting and Other (See Comments)    migranes Other reaction(s): Headache Migraine, vomiting   Sertraline Hcl     REACTION: Worsened symptoms of IBS   Sulfa Antibiotics Rash   Sulfamethoxazole Rash   Sulfonamide Derivatives Rash   Past Medical History:  Past Medical History:  Diagnosis Date   Anxiety    Breast cancer ([place])    Diverticulitis    Family history of ovarian cancer    GERD (gastroesophageal reflux disease)  IBS (irritable bowel syndrome)    PONV (postoperative nausea and vomiting)    severe migraine and vomiting post anesthesia   Scoliosis    Past Surgical History:  Past Surgical History:  Procedure Laterality Date   ABDOMINAL HYSTERECTOMY     still has ovaries, no gyn cancer, hysterectomy due to endometriosis. NO cervix on exam 01/10/21   BREAST BIOPSY Left 09/15/2020   [place] bx of mass, path pending, Q marker   BREAST BIOPSY Left 09/15/2020   [place] bx of LN, hydromarker, path pending   BREAST RECONSTRUCTION WITH PLACEMENT OF TISSUE EXPANDER AND FLEX HD (ACELLULAR HYDRATED DERMIS) Bilateral 03/17/2021   Procedure: IMMEDIATE BILATERAL BREAST RECONSTRUCTION WITH PLACEMENT OF TISSUE EXPANDER AND FLEX HD (ACELLULAR HYDRATED DERMIS);  Surgeon: Wallace Going, DO;  Location: [facility];  Service: Plastics;  Laterality: Bilateral;   COLONOSCOPY WITH PROPOFOL N/A 11/21/2021   Procedure: COLONOSCOPY WITH PROPOFOL;  Surgeon: Lin Landsman, MD;  [facility] ENDOSCOPY;  Service: Gastroenterology;  Laterality: N/A;   IR IMAGING GUIDED PORT INSERTION  10/01/2020   MODIFIED MASTECTOMY Left  03/17/2021   Procedure: LEFT MODIFIED RADICAL MASTECTOMY;  Surgeon: Erroll Luna, MD;  Location: [facility];  Service: General;  Laterality: Left;   PORTA CATH REMOVAL Right 03/17/2021   Procedure: PORTA CATH REMOVAL;  Surgeon: Erroll Luna, MD;  Location: [facility];  Service: General;  Laterality: Right;   REMOVAL OF BILATERAL TISSUE EXPANDERS WITH PLACEMENT OF BILATERAL BREAST IMPLANTS Bilateral 05/09/2021   Procedure: REMOVAL OF BILATERAL TISSUE EXPANDERS WITH PLACEMENT OF BILATERAL BREAST IMPLANTS;  Surgeon: Wallace Going, DO;  Location: [facility];  Service: Plastics;  Laterality: Bilateral;  90 min   TOTAL MASTECTOMY Right 03/17/2021   Procedure: TOTAL MASTECTOMY;  Surgeon: Erroll Luna, MD;  Location: [facility];  Service: General;  Laterality: Right;   Social History:  Social History   Socioeconomic History   Marital status: Divorced    Spouse name: Not on file   Number of children: 2   Years of education: Not on file   Highest education level: Not on file  Occupational History   Occupation: Nurse    Employer: [employer]  Tobacco Use   Smoking status: Some Days    Packs/day: 0.25    Types: Cigarettes   Smokeless tobacco: Never   Tobacco comments:    Pt trying to quit now. Has reduced amount significantly  Vaping Use   Vaping Use: Never used  Substance and Sexual Activity   Alcohol use: Not Currently   Drug use: No   Sexual activity: Not Currently    Birth control/protection: None  Other Topics Concern   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 She and her spouse are primary caregivers.    Social Determinants of Health   Financial Resource Strain: Not on file  Food Insecurity: Not on file  Transportation Needs: Not on file  Physical Activity: Not on file  Stress: Not on file  Social Connections: Not on  file  Intimate Partner Violence: Not on file   Family History:  Family History  Problem Relation Age of Onset   Hypertension Mother    Osteoarthritis Mother    Diverticulitis Mother    Heart failure Father    Hypertension Father    Gout Father    Heart attack Father 36  Diverticulitis Brother    Ovarian cancer Paternal Grandmother     Review of Systems: Constitutional: Doesn't report fevers, chills or abnormal weight loss Eyes: Doesn't report blurriness of vision Ears, nose, mouth, throat, and face: Doesn't report sore throat Respiratory: Doesn't report cough, dyspnea or wheezes Cardiovascular: Doesn't report palpitation, chest discomfort  Gastrointestinal:  Doesn't report nausea, constipation, diarrhea GU: Doesn't report incontinence Skin: Doesn't report skin rashes Neurological: Per HPI Musculoskeletal: Doesn't report joint pain Behavioral/Psych: ++anxiety  Physical Exam: Vitals:   08/04/22 0909  BP: 128/79  Pulse: 87  Resp: 16  Temp: 97.6 F (36.4 C)  SpO2: 100%   KPS: 90. General: Alert, cooperative, pleasant, in no acute distress Head: Normal EENT: No conjunctival injection or scleral icterus.  Lungs: Resp effort normal Cardiac: Regular rate Abdomen: Non-distended abdomen Skin: No rashes cyanosis or petechiae. Extremities: No clubbing or edema  Neurologic Exam: Mental Status: Awake, alert, attentive to examiner. Oriented to self and environment. Language is fluent with intact comprehension.  Cranial Nerves: Visual acuity is grossly normal. Visual fields are full. Extra-ocular movements intact. No ptosis. Face is symmetric Motor: Tone and bulk are normal. Power is full in both arms and legs. Reflexes are diminished, no pathologic reflexes present.  Sensory: Stocking neuropathy changes Gait: Normal.   Labs: I have reviewed the data as listed    Component Value Date/Time   NA 136 07/27/2022 0855   K 3.7 07/27/2022 0855   CL 101 07/27/2022 0855   CO2  27 07/27/2022 0855   GLUCOSE 132 (H) 07/27/2022 0855   BUN 9 07/27/2022 0855   CREATININE 1.13 (H) 07/27/2022 0855   CALCIUM 9.3 07/27/2022 0855   PROT 7.4 07/27/2022 0855   ALBUMIN 3.6 07/27/2022 0855   AST 20 07/27/2022 0855   ALT 15 07/27/2022 0855   ALKPHOS 58 07/27/2022 0855   BILITOT 0.4 07/27/2022 0855   GFRNONAA 59 (L) 07/27/2022 0855   GFRAA >90 05/09/2012 1731   Lab Results  Component Value Date   WBC 4.9 07/27/2022   NEUTROABS 2.3 07/27/2022   HGB 11.1 (L) 07/27/2022   HCT 32.8 (L) 07/27/2022   MCV 94.0 07/27/2022   PLT 200 07/27/2022   Imaging:  [facility] Clinician Interpretation: I have personally reviewed the CNS images as listed.  My interpretation, in the context of the patient's clinical presentation, is stable disease  MR BRAIN W WO CONTRAST  Result Date: 07/31/2022 CLINICAL DATA:  Brain/CNS neoplasm, assess treatment response. Breast cancer. EXAM: MRI HEAD WITHOUT AND WITH CONTRAST TECHNIQUE: Multiplanar, multiecho pulse sequences of the brain and surrounding structures were obtained without and with intravenous contrast. CONTRAST:  7.4m GADAVIST GADOBUTROL 1 MMOL/ML IV SOLN COMPARISON:  MRI of the head without and with contrast 05/08/2022 FINDINGS: Brain: A stable 2 mm focus of enhancement is present within the medial posterior right frontal lobe. Lesion is best seen on sagittal image 87 of series 13 and axial image 187 of series 1023. No new enhancing lesions are present. Periventricular and subcortical T2 hyperintensities bilaterally are moderately advanced for age. Extensive T2 signal changes within the central pons are again noted, stable. No acute infarct or hemorrhage is present. The ventricles are of normal size. No significant extraaxial fluid collection is present. Deep brain nuclei are within normal limits. A cerebellum is within normal limits Vascular: Flow is present in the major intracranial arteries. Skull and upper cervical spine: The craniocervical  junction is normal. Upper cervical spine is within normal limits. Marrow signal is unremarkable. Sinuses/Orbits:  The paranasal sinuses and mastoid air cells are clear. The globes and orbits are within normal limits. IMPRESSION: 1. Stable 2 mm focus of enhancement within the medial posterior right frontal lobe remains concerning for a focal metastasis. 2. No new enhancing lesions are present. 3. Periventricular and subcortical T2 hyperintensities bilaterally are moderately advanced for age. The finding is nonspecific but can be seen in the setting of chronic microvascular ischemia, a demyelinating process such as multiple sclerosis, vasculitis, complicated migraine headaches, or as the sequelae of a prior infectious or inflammatory process. 4. Extensive T2 changes within the central pons suggests the other changes are likely due to chronic microvascular ischemia. Electronically Signed   By: San Morelle M.D.   On: 07/31/2022 07:14     Assessment/Plan 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H[facility])  Tricia Berry presents with clinical syndrome consistent with symmetric, length dependent, small and large fiber peripheral neuropathy.  Etiology is exposure to taxol chemotherapy.  We again discussed adding Elavil 75m HS given nocturnal symptoms, she is agreeable to this.  Also ok with trial of 559mbaclofen TID prn for spasticity symptoms.  MRI brain demonstrates stable findings after 2 month follow up interval.  Etiology is metastasis vs non-specific enhancement.  Will recommend repeating MRI brain in 4 months for further characterization.  [facility] c/w met [facility] She is agreeable with this plan.  We appreciate the opportunity to participate in the care of [place]  We ask that Kr[facility]YCE STONEHOUSEeturn to clinic in 4 months following next brain MRI, or sooner as needed.  All questions were answered. The patient knows to call the clinic with any problems, questions or  concerns. No barriers to learning were detected.  The total time spent in the encounter was 30 minutes and more than 50% was on counseling and review of test results   ZaVentura SellersMD Medical Director of Neuro-Oncology [facility] We[facility]0/13/23 9:02 AM

## 2022-08-07 ENCOUNTER — Telehealth: Payer: No Typology Code available for payment source | Admitting: Surgical

## 2022-08-08 ENCOUNTER — Encounter: Payer: Self-pay | Admitting: Oncology

## 2022-08-08 ENCOUNTER — Ambulatory Visit (INDEPENDENT_AMBULATORY_CARE_PROVIDER_SITE_OTHER): Payer: No Typology Code available for payment source | Admitting: Surgical

## 2022-08-08 ENCOUNTER — Telehealth: Payer: Self-pay

## 2022-08-08 ENCOUNTER — Encounter: Payer: Self-pay | Admitting: Surgical

## 2022-08-08 DIAGNOSIS — Z923 Personal history of irradiation: Secondary | ICD-10-CM

## 2022-08-08 DIAGNOSIS — Z9013 Acquired absence of bilateral breasts and nipples: Secondary | ICD-10-CM | POA: Diagnosis not present

## 2022-08-08 DIAGNOSIS — C50412 Malignant neoplasm of upper-outer quadrant of left female breast: Secondary | ICD-10-CM | POA: Diagnosis not present

## 2022-08-08 DIAGNOSIS — Z170 Estrogen receptor positive status [ER+]: Secondary | ICD-10-CM | POA: Diagnosis not present

## 2022-08-08 NOTE — Progress Notes (Signed)
   Referring Provider Burnard Hawthorne, FNP [address] [place],  [place] [address]   CC:  Chief Complaint  Patient presents with   Follow-up    Tricia Berry is an 51 y.o. female.  HPI: Patient is a 51 year old female who underwent bilateral breast reconstruction with Dr. Marla Roe on 05/09/2021.  She had saline Mentor smooth round high-profile implants placed bilaterally, 320 cc saline placed in each implant.  She was previously scheduled for capsulectomy on 07/12/2022 with Dr. Marla Roe, however this surgery was postponed due to the patient being on dual chemotherapy with Herceptin and Perjeta.  Patient reports today that she finished this September 27.  Of note she has a history of radiation to the left breast, she is 1 year out from radiation to the left breast and peripheral lymphatics.  She also reports that she is now on a new medication called Verzenio or abemaciclib, reports that she will be on this for 2 years along with anastrozole.  She reports that she is still interested in pursuing surgical intervention for repositioning of the right breast implant.  She is bothered by the asymmetry.  She reports she previously discussed with Dr. Marla Roe that fat grafting and capsulectomy cannot be done at the same time.  She reports she is going to reach out to oncology to request if she can hold Verzenio prior to and after surgery.  Of note, she does have a 2 mm focus of enhancement in the medial posterior right frontal lobe which was seen on MRI, stable, but remains concerning for focal mets.   The patient gave consent to have this visit done by telemedicine / virtual visit, two identifiers were used to identify patient. This is also consent for access the chart and treat the patient via this visit. The patient is located in [place].  I, the provider, am at the office.  We spent approximately 10 minutes together for the visit.  Joined by phone.  Physical  Exam    08/04/2022    9:09 AM 08/02/2022   10:04 AM 08/01/2022   10:36 AM  Vitals with BMI  Height  5\' 0"  5\' 0"   Weight 117 lbs 116 lbs 3 oz 115 lbs 13 oz  BMI 22.85 53.64 68.03  Systolic 212 248 250  Diastolic 79 93 78  Pulse 87 75 70    Psych: Normal mood  Assessment/Plan 51 year old female status post bilateral breast reconstruction, she was initially scheduled for capsulectomy on the right for improved symmetry.  However this was postponed due to patient being on dual chemotherapy with Herceptin and Perjeta.  She is interested in pursuing surgical intervention, she is hopeful to have this done before the end of the year.  She reports she is going to reach out to oncology to see if she can hold Verzenio prior to and after surgery.  I would like to schedule the patient for telephone visit with Dr. Marla Roe in a few weeks to further discuss surgery.  All the patient's questions were answered to her content.   Carola Rhine Tricia Berry 08/08/2022, 11:00 AM

## 2022-08-08 NOTE — Telephone Encounter (Signed)
Ref# Al [id number]. 08/09/2022. Case ID# [id number]. Callled to check on status. Requested for 11/15-12/31. Waiting for approv. Should respond within 24 hours.

## 2022-08-09 ENCOUNTER — Inpatient Hospital Stay: Payer: No Typology Code available for payment source | Admitting: Occupational Therapy

## 2022-08-09 DIAGNOSIS — I890 Lymphedema, not elsewhere classified: Secondary | ICD-10-CM

## 2022-08-09 DIAGNOSIS — G620 Drug-induced polyneuropathy: Secondary | ICD-10-CM

## 2022-08-09 NOTE — Therapy (Signed)
[name] [place], [place], [place], [address] Phone: [phone]   Fax:  [phone]  Occupational Therapy Screen:  Patient Details  Name: Tricia Berry MRN: [id number] Date of Birth: [birth year] No data recorded  Encounter Date: 08/09/2022   OT End of Session - 08/09/22 2315     Visit Number 0             Past Medical History:  Diagnosis Date   Anxiety    Breast cancer ([place])    Diverticulitis    Family history of ovarian cancer    GERD (gastroesophageal reflux disease)    IBS (irritable bowel syndrome)    PONV (postoperative nausea and vomiting)    severe migraine and vomiting post anesthesia   Scoliosis     Past Surgical History:  Procedure Laterality Date   ABDOMINAL HYSTERECTOMY     still has ovaries, no gyn cancer, hysterectomy due to endometriosis. NO cervix on exam 01/10/21   BREAST BIOPSY Left 09/15/2020   [place] bx of mass, path pending, Q marker   BREAST BIOPSY Left 09/15/2020   [place] bx of LN, hydromarker, path pending   BREAST RECONSTRUCTION WITH PLACEMENT OF TISSUE EXPANDER AND FLEX HD (ACELLULAR HYDRATED DERMIS) Bilateral 03/17/2021   Procedure: IMMEDIATE BILATERAL BREAST RECONSTRUCTION WITH PLACEMENT OF TISSUE EXPANDER AND FLEX HD (ACELLULAR HYDRATED DERMIS);  Surgeon: Wallace Going, DO;  Location: [place];  Service: Plastics;  Laterality: Bilateral;   COLONOSCOPY WITH PROPOFOL N/A 11/21/2021   Procedure: COLONOSCOPY WITH PROPOFOL;  Surgeon: Lin Landsman, MD;  Location: [facility];  Service: Gastroenterology;  Laterality: N/A;   IR IMAGING GUIDED PORT INSERTION  10/01/2020   MODIFIED MASTECTOMY Left 03/17/2021   Procedure: LEFT MODIFIED RADICAL MASTECTOMY;  Surgeon: Erroll Luna, MD;  Location: [place];  Service: General;  Laterality: Left;   PORTA CATH REMOVAL Right 03/17/2021   Procedure: PORTA CATH REMOVAL;  Surgeon: Erroll Luna, MD;  Location: [place];  Service: General;  Laterality: Right;   REMOVAL OF BILATERAL TISSUE EXPANDERS WITH PLACEMENT OF BILATERAL BREAST IMPLANTS Bilateral 05/09/2021   Procedure: REMOVAL OF BILATERAL TISSUE EXPANDERS WITH PLACEMENT OF BILATERAL BREAST IMPLANTS;  Surgeon: Wallace Going, DO;  Location: [place];  Service: Plastics;  Laterality: Bilateral;  90 min   TOTAL MASTECTOMY Right 03/17/2021   Procedure: TOTAL MASTECTOMY;  Surgeon: Erroll Luna, MD;  Location: [place];  Service: General;  Laterality: Right;    There were no vitals filed for this visit.   Subjective Assessment - 08/09/22 2310     Subjective  I do not have date for reconstruction yet on my R breast - but have neuropathy and now spams into my L hand and fingers - fingers and thumb bends into fist sometimes into my forearm - I do want to preventitive sleeve for my  L arm when I am doing like yardwork    Currently in Pain? --   No pain scale provide                LYMPHEDEMA/ONCOLOGY QUESTIONNAIRE - 08/09/22 0001       Right Upper Extremity Lymphedema   15 cm Proximal to Olecranon Process 26 cm    10 cm Proximal to Olecranon Process 23 cm    Olecranon Process 22.5 cm    15 cm Proximal to Ulnar Styloid Process 22.5 cm    Just Proximal to  Ulnar Styloid Process 14.5 cm      Left Upper Extremity Lymphedema   15 cm Proximal to Olecranon Process 26 cm    10 cm Proximal to Olecranon Process 23 cm    Olecranon Process 22 cm    15 cm Proximal to Ulnar Styloid Process 21.5 cm    Just Proximal to Ulnar Styloid Process 18 cm             Plastic surgery - [place] PA: 08/08/22  HPI: Patient is a 51 year old female who underwent bilateral breast reconstruction with Dr. Marla Roe on 05/09/2021.  She had saline Mentor smooth round high-profile implants placed bilaterally, 320 cc saline placed in each implant.   She was previously scheduled for  capsulectomy on 07/12/2022 with Dr. Marla Roe, however this surgery was postponed due to the patient being on dual chemotherapy with Herceptin and Perjeta.  Patient reports today that she finished this September 27.   Of note she has a history of radiation to the left breast, she is 1 year out from radiation to the left breast and peripheral lymphatics.   She also reports that she is now on a new medication called Verzenio or abemaciclib, reports that she will be on this for 2 years along with anastrozole.  She reports that she is still interested in pursuing surgical intervention for repositioning of the right breast implant.  She is bothered by the asymmetry.  She reports she previously discussed with Dr. Marla Roe that fat grafting and capsulectomy cannot be done at the same time.  She reports she is going to reach out to oncology to request if she can hold Verzenio prior to and after surgery.   Of note, she does have a 2 mm focus of enhancement in the medial posterior right frontal lobe which was seen on MRI, stable, but remains concerning for focal mets.   Assessment/Plan 51 year old female status post bilateral breast reconstruction, she was initially scheduled for capsulectomy on the right for improved symmetry.  However this was postponed due to patient being on dual chemotherapy with Herceptin and Perjeta.  She is interested in pursuing surgical intervention, she is hopeful to have this done before the end of the year.   She reports she is going to reach out to oncology to see if she can hold Verzenio prior to and after surgery.   I would like to schedule the patient for telephone visit with Dr. Marla Roe in a few weeks to further discuss surgery.  All the patient's questions were answered to her content.    OT SCREEN 08/09/22: Patient referred by Dr. Janese Banks possible compression sleeve on left arm for high risk activities.  Patient report neuropathy in bilateral hands with left worse and  with increasing spasms in thumb through third digit at times locking into a fist as well as spasm into the forearm.  Causing her to favor left hand and not using it as much.  And with increased weakness.  Patient circumference of bilateral upper extremity measured left even with the right dominant upper extremity. Patient to report some increased edema at times in left thoracic.  Was using a compression bra most of the time but is getting too small or tight.  Patient also seen in the past for cording in axilla.  At another clinic. Would recommend at this stage for patient a Jovi pack unilateral postmastectomy pad to decongest thoracic and prevent backing up of lymphatics in the upper extremity.  As well as over-the-counter compression sleeve for  left upper extremity with glove. Educated patient on trying to do contrast 2 times a day on left hand and forearm followed by husband doing carpal tunnel spreads and doing some volar flexor stretches with elbow to side 5 reps hold 5 seconds. Patient was also educated on overhead shoulder flexion as well as abduction active assisted range of motion using wand 10 reps and external rotation. Patient can benefit from OT for eval and treat for neuropathy in bilateral hands left the worse for decreasing pain and spasm increase motion and functional use. Dr. Owens Shark vacation this week we will ask for OT eval and treat ordered next week as well as order for Jovi pack and over-the-counter compression sleeve.                          Patient will benefit from skilled therapeutic intervention in order to improve the following deficits and impairments:           Visit Diagnosis: Lymphedema of arm  Chemotherapy-induced peripheral neuropathy [facility])    Problem List Patient Active Problem List   Diagnosis Date Noted   Lumbosacral facet arthropathy (Multilevel) (Bilateral) 07/05/2022   Lumbar spinal facet joint arthropathy with concurrent  effusion (L3-4) (Right) 07/05/2022   Degenerative lumbar rotatory levoscoliosis 07/05/2022   Lumbosacral foraminal stenosis (Bilateral: L3-4) (Left: L4-5, L5-S1) 07/05/2022   Lumbosacral lateral recess stenosis (Left: L4-5) (Bilateral: L5-S1) 07/05/2022   Lumbar nerve root impingement (Bilateral: L3-4, L5-S1) (Left: L4-5) 07/05/2022   Long term prescription benzodiazepine use 07/05/2022   Chronic use of opiate for therapeutic purpose 07/05/2022   Lumbosacral facet syndrome (Bilateral) 07/05/2022   Pain medication agreement signed (12/07/21) 05/29/2022   History of breast cancer (Left) 05/29/2022   History of mastectomy (Bilateral) 05/29/2022   Chronic calf pain (2ry area of Pain) (Bilateral) 05/29/2022   Chronic lower extremity pain (3ry area of Pain) (Bilateral) 05/29/2022   Chronic feet pain (Bilateral) 05/29/2022   Chronic upper extremity pain (4th area of Pain) (Bilateral) (L>R) 05/29/2022   Chronic hand pain (Bilateral) (L>R) 05/29/2022   Chronic generalized pain (5th area of Pain) 05/29/2022   Abnormal MRI, lumbar spine (10/13/2021) 05/29/2022   Chronic pain syndrome 05/28/2022   Pharmacologic therapy 05/28/2022   Disorder of skeletal system 05/28/2022   Problems influencing health status 05/28/2022   Family history of heart disease 11/16/2021   Acquired absence of breast 08/16/2021   B12 deficiency 07/21/2021   Atherosclerosis of aorta ([place]) 07/20/2021   Hepatic steatosis 07/20/2021   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[place]) 03/14/2021   Genetic testing 11/22/2020   Family history of ovarian cancer    Candidal vulvovaginitis 11/01/2020   Chronic low back pain (1ry area of Pain) (Bilateral) (R>L) w/o sciatica 10/06/2020   Encounter for medical examination to establish care 09/26/2020   Malignant neoplasm of upper-outer quadrant of left breast in female, estrogen receptor positive ([place]) 09/26/2020   BACK STRAIN, LUMBAR 03/08/2010   INTERNAL HEMORRHOIDS 01/27/2010   Anal fissure  01/27/2010   OSTEOARTHRITIS, HANDS, BILATERAL 01/04/2010   Arthralgia 10/05/2009   INSOMNIA, CHRONIC 05/06/2009   ALLERGIC RHINITIS, SEASONAL 02/09/2009   TOBACCO ABUSE 12/29/2008   IBS 10/13/2008   Anxiety state 08/25/2008   DEPRESSION, RECURRENT 08/25/2008    Rosalyn Gess, OTR/L,CLT 08/09/2022, 11:16 PM  [facility] [address], [place], [place], [address] Phone: [phone]   Fax:  [phone]  Name: MEHLANI BLANKENBURG MRN: [id number] Date of Birth:  03/18/1971  

## 2022-08-12 NOTE — Progress Notes (Unsigned)
PROVIDER NOTE: Information contained herein reflects review and annotations entered in association with encounter. Interpretation of such information and data should be left to medically-trained personnel. Information provided to patient can be located elsewhere in the medical record under "Patient Instructions". Document created using STT-dictation technology, any transcriptional errors that may result from process are unintentional.    Patient: Tricia Berry  Service Category: E/M  Provider: Gaspar Cola, MD  DOB: 08/20/1971  DOS: 08/14/2022  Referring Provider: Burnard Hawthorne, FNP  MRN: [id number]  Specialty: Interventional Pain Management  PCP: Burnard Hawthorne, FNP  Type: Established Patient  Setting: Ambulatory outpatient    Location: Office  Delivery: Face-to-face     HPI  Tricia Berry, a 51 y.o. year old female, is here today because of her No primary diagnosis found.. Tricia Berry's primary complain today is No chief complaint on file. Last encounter: My last encounter with her was on 07/05/2022. Pertinent problems: Tricia Berry has OSTEOARTHRITIS, HANDS, BILATERAL; Arthralgia; BACK STRAIN, LUMBAR; Malignant neoplasm of upper-outer quadrant of left breast in female, estrogen receptor positive ([place]); Chronic low back pain (1ry area of Pain) (Bilateral) (R>L) w/o sciatica; 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[place]); Chronic pain syndrome; History of breast cancer (Left); History of mastectomy (Bilateral); Chronic calf pain (2ry area of Pain) (Bilateral); Chronic lower extremity pain (3ry area of Pain) (Bilateral); Chronic feet pain (Bilateral); Chronic upper extremity pain (4th area of Pain) (Bilateral) (L>R); Chronic hand pain (Bilateral) (L>R); Chronic generalized pain (5th area of Pain); Abnormal MRI, lumbar spine (10/13/2021); Lumbosacral facet arthropathy (Multilevel) (Bilateral); Lumbar spinal facet joint arthropathy with concurrent effusion (L3-4) (Right); Degenerative  lumbar rotatory levoscoliosis; 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 Pain Assessment: Severity of   is reported as a  /10. Location:    / . Onset:  . Quality:  . Timing:  . Modifying factor(s):  Marland Kitchen Vitals:  vitals were not taken for this visit.   Reason for encounter:  *** . ***  Pharmacotherapy Assessment  Analgesic: Oxycodone IR 10 mg tablet, 1 tab p.o. daily (# 30) (last filled on 06/19/2022) MME/day: 15 mg/day   Monitoring: [place] PMP: PDMP reviewed during this encounter.       Pharmacotherapy: No side-effects or adverse reactions reported. Compliance: No problems identified. Effectiveness: Clinically acceptable.  No notes on file  No results found for: "CBDTHCR" No results found for: "D8THCCBX" No results found for: "D9THCCBX"  UDS:  Summary  Date Value Ref Range Status  05/29/2022 Note  Final    Comment:    ==================================================================== Compliance Drug Analysis, Ur ==================================================================== Test                             Result       Flag       Units  Drug Present and Declared for Prescription Verification   Lorazepam                      1231         EXPECTED   ng/mg creat    Source of lorazepam is a scheduled prescription medication.    Oxycodone                      1391  EXPECTED   ng/mg creat   Noroxycodone                   5467         EXPECTED   ng/mg creat    Sources of oxycodone include scheduled prescription medications.    Noroxycodone is an expected metabolite of oxycodone.    Pregabalin                     PRESENT      EXPECTED   Fluoxetine                     PRESENT      EXPECTED   Norfluoxetine                  PRESENT       EXPECTED    Norfluoxetine is an expected metabolite of fluoxetine.  Drug Present not Declared for Prescription Verification   Oxazepam                       376          UNEXPECTED ng/mg creat   Temazepam                      41           UNEXPECTED ng/mg creat    Oxazepam and temazepam are expected metabolites of diazepam.    Oxazepam is also an expected metabolite of other benzodiazepine    drugs, including chlordiazepoxide, prazepam, clorazepate, halazepam,    and temazepam.  Oxazepam and temazepam are available as scheduled    prescription medications.    Acetaminophen                  PRESENT      UNEXPECTED  Drug Absent but Declared for Prescription Verification   Tizanidine                     Not Detected UNEXPECTED    Tizanidine, as indicated in the declared medication list, is not    always detected even when used as directed.    Prochlorperazine               Not Detected UNEXPECTED   Lidocaine                      Not Detected UNEXPECTED    Lidocaine, as indicated in the declared medication list, is not    always detected even when used as directed.  ==================================================================== Test                      Result    Flag   Units      Ref Range   Creatinine              54               mg/dL      >=20 ==================================================================== Declared Medications:  The flagging and interpretation on this report are based on the  following declared medications.  Unexpected results may arise from  inaccuracies in the declared medications.   **Note: The testing scope of this panel includes these medications:   Fluoxetine (Prozac)  Lorazepam (Ativan)  Oxycodone  Pregabalin (Lyrica)  Prochlorperazine (Compazine)   **Note: The testing scope of this panel does not include small to  moderate  amounts of these reported medications:   Tizanidine (Zanaflex)  Topical Lidocaine (EMLA)   **Note: The testing  scope of this panel does not include the  following reported medications:   Abemaciclib (Verzenio)  Anastrozole (Arimidex)  Calcium  Multivitamin  Nicotine  Ondansetron (Zofran)  Prilocaine (EMLA)  Probiotic  Rosuvastatin (Crestor)  Supplement  Trimethoprim  Vitamin B12  Vitamin D ==================================================================== For clinical consultation, please call [phone]. ====================================================================       ROS  Constitutional: Denies any fever or chills Gastrointestinal: No reported hemesis, hematochezia, vomiting, or acute GI distress Musculoskeletal: Denies any acute onset joint swelling, redness, loss of ROM, or weakness Neurological: No reported episodes of acute onset apraxia, aphasia, dysarthria, agnosia, amnesia, paralysis, loss of coordination, or loss of consciousness  Medication Review  Calcium-Vitamin D, FLUoxetine, LORazepam, Lactobacillus, Multiple Vitamins-Minerals, Oxycodone HCl, Vitamin D, abemaciclib, amitriptyline, anastrozole, b complex vitamins, baclofen, cyanocobalamin, gentamicin ointment, nicotine, olopatadine, ondansetron, pregabalin, rosuvastatin, tiZANidine, and trimethoprim  History Review  Allergy: Tricia Berry is allergic to morphine, sertraline hcl, sulfa antibiotics, sulfamethoxazole, and sulfonamide derivatives. Drug: Tricia Berry  reports no history of drug use. Alcohol:  reports that she does not currently use alcohol. Tobacco:  reports that she has been smoking cigarettes. She has been smoking an average of .25 packs per day. She has never used smokeless tobacco. Social: Tricia Berry  reports that she has been smoking cigarettes. She has been smoking an average of .25 packs per day. She has never used smokeless tobacco. She reports that she does not currently use alcohol. She reports that she does not use drugs. Medical:  has a past medical history of Anxiety, Breast cancer  (HCC), Diverticulitis, Family history of ovarian cancer, GERD (gastroesophageal reflux disease), IBS (irritable bowel syndrome), PONV (postoperative nausea and vomiting), and Scoliosis. Surgical: Tricia Berry  has a past surgical history that includes Abdominal hysterectomy; Breast biopsy (Left, 09/15/2020); Breast biopsy (Left, 09/15/2020); IR IMAGING GUIDED PORT INSERTION (10/01/2020); Modified mastectomy (Left, 03/17/2021); Total mastectomy (Right, 03/17/2021); PORTA CATH REMOVAL (Right, 03/17/2021); Breast reconstruction with placement of tissue expander and flex hd (acellular hydrated dermis) (Bilateral, 03/17/2021); Removal of bilateral tissue expanders with placement of bilateral breast implants (Bilateral, 05/09/2021); and Colonoscopy with propofol (N/A, 11/21/2021). Family: family history includes Diverticulitis in her brother and mother; Gout in her father; Heart attack (age of onset: 3) in her father; Heart failure in her father; Hypertension in her father and mother; Osteoarthritis in her mother; Ovarian cancer in her paternal grandmother.  Laboratory Chemistry Profile   Renal Lab Results  Component Value Date   BUN 9 07/27/2022   CREATININE 1.13 (H) 07/27/2022   GFR 66.76 12/29/2021   GFRAA >90 05/09/2012   GFRNONAA 59 (L) 07/27/2022    Hepatic Lab Results  Component Value Date   AST 20 07/27/2022   ALT 15 07/27/2022   ALBUMIN 3.6 07/27/2022   ALKPHOS 58 07/27/2022    Electrolytes Lab Results  Component Value Date   NA 136 07/27/2022   K 3.7 07/27/2022   CL 101 07/27/2022   CALCIUM 9.3 07/27/2022   MG 1.8 05/23/2022    Bone Lab Results  Component Value Date   25OHVITD1 38 05/29/2022   25OHVITD2 1.2 05/29/2022   25OHVITD3 37 05/29/2022    Inflammation (CRP: Acute Phase) (ESR: Chronic Phase) Lab Results  Component Value Date   CRP 5 05/29/2022   ESRSEDRATE CANCELED 05/29/2022         Note: Above Lab results reviewed.  Recent Imaging Review  MR BRAIN W WO  CONTRAST CLINICAL DATA:  Brain/CNS neoplasm, assess treatment response. Breast cancer.  EXAM: MRI HEAD WITHOUT AND WITH CONTRAST  TECHNIQUE: Multiplanar, multiecho pulse sequences of the brain and surrounding structures were obtained without and with intravenous contrast.  CONTRAST:  7.51mL GADAVIST GADOBUTROL 1 MMOL/ML IV SOLN  COMPARISON:  MRI of the head without and with contrast 05/08/2022  FINDINGS: Brain: A stable 2 mm focus of enhancement is present within the medial posterior right frontal lobe. Lesion is best seen on sagittal image 87 of series 13 and axial image 187 of series 1023.  No new enhancing lesions are present.  Periventricular and subcortical T2 hyperintensities bilaterally are moderately advanced for age. Extensive T2 signal changes within the central pons are again noted, stable.  No acute infarct or hemorrhage is present. The ventricles are of normal size. No significant extraaxial fluid collection is present. Deep brain nuclei are within normal limits. A cerebellum is within normal limits  Vascular: Flow is present in the major intracranial arteries.  Skull and upper cervical spine: The craniocervical junction is normal. Upper cervical spine is within normal limits. Marrow signal is unremarkable.  Sinuses/Orbits: The paranasal sinuses and mastoid air cells are clear. The globes and orbits are within normal limits.  IMPRESSION: 1. Stable 2 mm focus of enhancement within the medial posterior right frontal lobe remains concerning for a focal metastasis. 2. No new enhancing lesions are present. 3. Periventricular and subcortical T2 hyperintensities bilaterally are moderately advanced for age. The finding is nonspecific but can be seen in the setting of chronic microvascular ischemia, a demyelinating process such as multiple sclerosis, vasculitis, complicated migraine headaches, or as the sequelae of a prior infectious or inflammatory process. 4.  Extensive T2 changes within the central pons suggests the other changes are likely due to chronic microvascular ischemia.  Electronically Signed   By: San Morelle M.D.   On: 07/31/2022 07:14 Note: Reviewed        Physical Exam  General appearance: Well nourished, well developed, and well hydrated. In no apparent acute distress Mental status: Alert, oriented x 3 (person, place, & time)       Respiratory: No evidence of acute respiratory distress Eyes: PERLA Vitals: There were no vitals taken for this visit. BMI: Estimated body mass index is 22.85 kg/m as calculated from the following:   Height as of 08/02/22: 5' (1.524 m).   Weight as of 08/04/22: 117 lb (53.1 kg). Ideal: Ideal body weight: 45.5 kg (100 lb 4.9 oz) Adjusted ideal body weight: 48.5 kg (106 lb 15.8 oz)  Assessment   Diagnosis Status  No diagnosis found. Controlled Controlled Controlled   Updated Problems: No problems updated.   Plan of Care  Problem-specific:  No problem-specific Assessment & Plan notes found for this encounter.  Tricia Berry has a current medication list which includes the following long-term medication(s): amitriptyline, calcium-vitamin d, fluoxetine, oxycodone hcl, pregabalin, and rosuvastatin.  Pharmacotherapy (Medications Ordered): No orders of the defined types were placed in this encounter.  Orders:  No orders of the defined types were placed in this encounter.  Follow-up plan:   No follow-ups on file.     Interventional Therapies  Risk  Complexity Considerations:   Estimated body mass index is 21.29 kg/m as calculated from the following:   Height as of this encounter: 5' (1.524 m).   Weight as of this encounter: 109 lb (49.4 kg). WNL   Planned  Pending:   Referral to  neurology for nerve conduction test (EMG/PNCV) of the lower and upper extremities (Bilateral)    Under consideration:   Diagnostic/therapeutic (Midline) caudal ESI + epidurogram #1   Diagnostic/therapeutic bilateral L3 transforaminal ESI #1  Diagnostic bilateral lumbar facet MBB (to include the L3-4 level) #1    Completed:   None at this time   Completed by other providers:   Therapeutic bilateral L3, L4, and L5 medial branch RFA x1 (02/16/2022) by Sharlet Salina, DO  Diagnostic/therapeutic bilateral L3, L4, and L5 MBB x2 (12/30/2021; 01/17/2022) by Sharlet Salina, DO  Diagnostic/therapeutic right L5-S1 TESI + right S1 TESI x1 (11/29/2021) by Sharlet Salina, DO  Therapeutic sacral trigger point injections x1 (09/30/2021) by Rosalia Hammers, DO    Therapeutic  Palliative (PRN) options:   None established      Recent Visits Date Type Provider Dept  07/05/22 Office Visit Milinda Pointer, MD [facility]  05/29/22 Office Visit Milinda Pointer, MD [facility]  Showing recent visits within past 90 days and meeting all other requirements Future Appointments Date Type Provider Dept  08/14/22 Appointment Milinda Pointer, MD [facility]  Showing future appointments within next 90 days and meeting all other requirements  I discussed the assessment and treatment plan with the patient. The patient was provided an opportunity to ask questions and all were answered. The patient agreed with the plan and demonstrated an understanding of the instructions.  Patient advised to call back or seek an in-person evaluation if the symptoms or condition worsens.  Duration of encounter: *** minutes.  Total time on encounter, as per AMA guidelines included both the face-to-face and non-face-to-face time personally spent by the physician and/or other qualified health care professional(s) on the day of the encounter (includes time in activities that require the physician or other qualified health care professional and does not include time in activities normally performed by clinical staff). Physician's time may include the following activities when  performed: preparing to see the patient (eg, review of tests, pre-charting review of records) obtaining and/or reviewing separately obtained history performing a medically appropriate examination and/or evaluation counseling and educating the patient/family/caregiver ordering medications, tests, or procedures referring and communicating with other health care professionals (when not separately reported) documenting clinical information in the electronic or other health record independently interpreting results (not separately reported) and communicating results to the patient/ family/caregiver care coordination (not separately reported)  Note by: Gaspar Cola, MD Date: 08/14/2022; Time: 7:58 AM

## 2022-08-14 ENCOUNTER — Ambulatory Visit: Payer: No Typology Code available for payment source | Attending: Pain Medicine | Admitting: Pain Medicine

## 2022-08-14 ENCOUNTER — Encounter: Payer: Self-pay | Admitting: Pain Medicine

## 2022-08-14 ENCOUNTER — Ambulatory Visit: Payer: No Typology Code available for payment source

## 2022-08-14 ENCOUNTER — Other Ambulatory Visit: Payer: Self-pay

## 2022-08-14 VITALS — BP 135/86 | HR 70 | Temp 97.8°F | Resp 18 | Ht 60.0 in | Wt 117.0 lb

## 2022-08-14 DIAGNOSIS — M79672 Pain in left foot: Secondary | ICD-10-CM | POA: Diagnosis present

## 2022-08-14 DIAGNOSIS — M4156 Other secondary scoliosis, lumbar region: Secondary | ICD-10-CM

## 2022-08-14 DIAGNOSIS — M545 Low back pain, unspecified: Secondary | ICD-10-CM | POA: Insufficient documentation

## 2022-08-14 DIAGNOSIS — M2540 Effusion, unspecified joint: Secondary | ICD-10-CM | POA: Insufficient documentation

## 2022-08-14 DIAGNOSIS — M79661 Pain in right lower leg: Secondary | ICD-10-CM

## 2022-08-14 DIAGNOSIS — Z853 Personal history of malignant neoplasm of breast: Secondary | ICD-10-CM

## 2022-08-14 DIAGNOSIS — M79671 Pain in right foot: Secondary | ICD-10-CM | POA: Insufficient documentation

## 2022-08-14 DIAGNOSIS — M79601 Pain in right arm: Secondary | ICD-10-CM | POA: Insufficient documentation

## 2022-08-14 DIAGNOSIS — M79662 Pain in left lower leg: Secondary | ICD-10-CM | POA: Diagnosis present

## 2022-08-14 DIAGNOSIS — M47819 Spondylosis without myelopathy or radiculopathy, site unspecified: Secondary | ICD-10-CM | POA: Diagnosis present

## 2022-08-14 DIAGNOSIS — R52 Pain, unspecified: Secondary | ICD-10-CM | POA: Insufficient documentation

## 2022-08-14 DIAGNOSIS — G8929 Other chronic pain: Secondary | ICD-10-CM | POA: Diagnosis present

## 2022-08-14 DIAGNOSIS — Z79899 Other long term (current) drug therapy: Secondary | ICD-10-CM | POA: Diagnosis present

## 2022-08-14 DIAGNOSIS — M47817 Spondylosis without myelopathy or radiculopathy, lumbosacral region: Secondary | ICD-10-CM

## 2022-08-14 DIAGNOSIS — M79604 Pain in right leg: Secondary | ICD-10-CM | POA: Insufficient documentation

## 2022-08-14 DIAGNOSIS — Z79891 Long term (current) use of opiate analgesic: Secondary | ICD-10-CM

## 2022-08-14 DIAGNOSIS — G894 Chronic pain syndrome: Secondary | ICD-10-CM | POA: Diagnosis present

## 2022-08-14 DIAGNOSIS — M4807 Spinal stenosis, lumbosacral region: Secondary | ICD-10-CM

## 2022-08-14 DIAGNOSIS — M5416 Radiculopathy, lumbar region: Secondary | ICD-10-CM

## 2022-08-14 DIAGNOSIS — M79602 Pain in left arm: Secondary | ICD-10-CM | POA: Insufficient documentation

## 2022-08-14 DIAGNOSIS — M79605 Pain in left leg: Secondary | ICD-10-CM | POA: Diagnosis present

## 2022-08-14 MED ORDER — OXYCODONE HCL 5 MG PO TABS
5.0000 mg | ORAL_TABLET | Freq: Two times a day (BID) | ORAL | 0 refills | Status: DC
  Filled 2022-08-18: qty 60, 30d supply, fill #0
  Filled ????-??-??: fill #0

## 2022-08-14 MED ORDER — NALOXONE HCL 4 MG/0.1ML NA LIQD
1.0000 | NASAL | 0 refills | Status: AC | PRN
  Filled 2022-08-14: qty 2, 30d supply, fill #0

## 2022-08-14 NOTE — Progress Notes (Signed)
Safety precautions to be maintained throughout the outpatient stay will include: orient to surroundings, keep bed in low position, maintain call bell within reach at all times, provide assistance with transfer out of bed and ambulation.   Nursing Pain Medication Assessment:  Safety precautions to be maintained throughout the outpatient stay will include: orient to surroundings, keep bed in low position, maintain call bell within reach at all times, provide assistance with transfer out of bed and ambulation.  Medication Inspection Compliance: Pill count conducted under aseptic conditions, in front of the patient. Neither the pills nor the bottle was removed from the patient's sight at any time. Once count was completed pills were immediately returned to the patient in their original bottle.  Medication: Oxycodone HCI 10mg  Pill/Patch Count:  3 of 30 pills remain Pill/Patch Appearance: Markings consistent with prescribed medication Bottle Appearance: Standard pharmacy container. Clearly labeled. Filled Date: 09 / 27 / 2024 Last Medication intake:  Today

## 2022-08-15 ENCOUNTER — Encounter: Payer: Self-pay | Admitting: Plastic Surgery

## 2022-08-15 ENCOUNTER — Ambulatory Visit: Payer: No Typology Code available for payment source | Attending: Surgery | Admitting: Physical Therapy

## 2022-08-15 ENCOUNTER — Other Ambulatory Visit: Payer: Self-pay

## 2022-08-15 ENCOUNTER — Ambulatory Visit (INDEPENDENT_AMBULATORY_CARE_PROVIDER_SITE_OTHER): Payer: No Typology Code available for payment source | Admitting: Plastic Surgery

## 2022-08-15 ENCOUNTER — Other Ambulatory Visit: Payer: Self-pay | Admitting: *Deleted

## 2022-08-15 DIAGNOSIS — Z853 Personal history of malignant neoplasm of breast: Secondary | ICD-10-CM

## 2022-08-15 DIAGNOSIS — C50912 Malignant neoplasm of unspecified site of left female breast: Secondary | ICD-10-CM | POA: Insufficient documentation

## 2022-08-15 DIAGNOSIS — N651 Disproportion of reconstructed breast: Secondary | ICD-10-CM

## 2022-08-15 DIAGNOSIS — Z9013 Acquired absence of bilateral breasts and nipples: Secondary | ICD-10-CM

## 2022-08-15 DIAGNOSIS — G620 Drug-induced polyneuropathy: Secondary | ICD-10-CM

## 2022-08-15 DIAGNOSIS — Z170 Estrogen receptor positive status [ER+]: Secondary | ICD-10-CM | POA: Insufficient documentation

## 2022-08-15 DIAGNOSIS — F17200 Nicotine dependence, unspecified, uncomplicated: Secondary | ICD-10-CM

## 2022-08-15 DIAGNOSIS — I890 Lymphedema, not elsewhere classified: Secondary | ICD-10-CM

## 2022-08-15 DIAGNOSIS — C50412 Malignant neoplasm of upper-outer quadrant of left female breast: Secondary | ICD-10-CM

## 2022-08-15 NOTE — Progress Notes (Signed)
   Subjective:    Patient ID: Tricia Berry, female    DOB: Jan 06, 1971, 51 y.o.   MRN: [id number]  The patient is a 51 year old female who is joining me by phone today.  She underwent bilateral breast reconstruction and July.  She currently has an Product manager smooth round high-profile implants that are 320 cc saline implants.  She had radiation of the left breast that ended 1 year ago.  We have been waiting to do some adjustments.  The right implant is low and she is interested in having it lifted.  I think that is reasonable.  It probably be safer not to do the fat grafting and the capsule adjustment at the same time.  The patient's okay with this plan. She is to taking Verzenio and has clearance to hold 2 weeks before and 2 weeks after the surgery just to prevent slow healing.     Review of Systems  Constitutional: Negative.   HENT: Negative.    Eyes: Negative.   Respiratory: Negative.    Cardiovascular: Negative.   Gastrointestinal: Negative.   Endocrine: Negative.   Genitourinary: Negative.   Musculoskeletal: Negative.        Objective:   Physical Exam      Assessment & Plan:     ICD-10-CM   1. Acquired absence of both breasts  Z90.13     2. History of breast cancer (Left)  Z85.3     3. TOBACCO ABUSE  F17.200     4. Breast asymmetry following reconstructive surgery  N65.1        I connected with  Tricia Berry on 08/15/22 by phone and verified that I am speaking with the correct person using two identifiers. The patient was at home and I was at the office.  We spent 10 minutes in discussion about the plan which is release of capsule and readjustment of right breast implant.  We will not do anything to the left breast at this time.   I discussed the limitations of evaluation and management by telemedicine. The patient expressed understanding and agreed to proceed.

## 2022-08-15 NOTE — Therapy (Signed)
OUTPATIENT PHYSICAL THERAPY SOZO SCREENING NOTE   Patient Name: Tricia Berry MRN: [id number] DOB:October 10, 1971, 51 y.o., female Today's Date: 08/15/2022  PCP: Burnard Hawthorne, FNP REFERRING PROVIDER: Erroll Luna, MD   PT End of Session - 08/15/22 1101     Visit Number 23   unchaged due to screen only   PT Start Time 16    PT Stop Time 1013    PT Time Calculation (min) 11 min    Activity Tolerance Patient tolerated treatment well    Behavior During Therapy WFL for tasks assessed/performed             Past Medical History:  Diagnosis Date   Anxiety    Breast cancer ([place])    Diverticulitis    Family history of ovarian cancer    GERD (gastroesophageal reflux disease)    IBS (irritable bowel syndrome)    PONV (postoperative nausea and vomiting)    severe migraine and vomiting post anesthesia   Scoliosis    Past Surgical History:  Procedure Laterality Date   ABDOMINAL HYSTERECTOMY     still has ovaries, no gyn cancer, hysterectomy due to endometriosis. NO cervix on exam 01/10/21   BREAST BIOPSY Left 09/15/2020   [place] bx of mass, path pending, Q marker   BREAST BIOPSY Left 09/15/2020   [place] bx of LN, hydromarker, path pending   BREAST RECONSTRUCTION WITH PLACEMENT OF TISSUE EXPANDER AND FLEX HD (ACELLULAR HYDRATED DERMIS) Bilateral 03/17/2021   Procedure: IMMEDIATE BILATERAL BREAST RECONSTRUCTION WITH PLACEMENT OF TISSUE EXPANDER AND FLEX HD (ACELLULAR HYDRATED DERMIS);  Surgeon: Wallace Going, DO;  Location: [place];  Service: Plastics;  Laterality: Bilateral;   COLONOSCOPY WITH PROPOFOL N/A 11/21/2021   Procedure: COLONOSCOPY WITH PROPOFOL;  Surgeon: Lin Landsman, MD;  Location[facility] ENDOSCOPY;  Service: Gastroenterology;  Laterality: N/A;   IR IMAGING GUIDED PORT INSERTION  10/01/2020   MODIFIED MASTECTOMY Left 03/17/2021   Procedure: LEFT MODIFIED RADICAL MASTECTOMY;  Surgeon: Erroll Luna, MD;  Location: [place];  Service: General;  Laterality: Left;   PORTA CATH REMOVAL Right 03/17/2021   Procedure: PORTA CATH REMOVAL;  Surgeon: Erroll Luna, MD;  Location: [place];  Service: General;  Laterality: Right;   REMOVAL OF BILATERAL TISSUE EXPANDERS WITH PLACEMENT OF BILATERAL BREAST IMPLANTS Bilateral 05/09/2021   Procedure: REMOVAL OF BILATERAL TISSUE EXPANDERS WITH PLACEMENT OF BILATERAL BREAST IMPLANTS;  Surgeon: Wallace Going, DO;  Location: [place];  Service: Plastics;  Laterality: Bilateral;  90 min   TOTAL MASTECTOMY Right 03/17/2021   Procedure: TOTAL MASTECTOMY;  Surgeon: Erroll Luna, MD;  Location: [place];  Service: General;  Laterality: Right;   Patient Active Problem List   Diagnosis Date Noted   Breast asymmetry following reconstructive surgery 08/15/2022   Lumbosacral facet arthropathy (Multilevel) (Bilateral) 07/05/2022   Lumbar spinal facet joint arthropathy with concurrent effusion (L3-4) (Right) 07/05/2022   Degenerative lumbar rotatory levoscoliosis 07/05/2022   Lumbosacral foraminal stenosis (Bilateral: L3-4) (Left: L4-5, L5-S1) 07/05/2022   Lumbosacral lateral recess stenosis (Left: L4-5) (Bilateral: L5-S1) 07/05/2022   Lumbar nerve root impingement (Bilateral: L3-4, L5-S1) (Left: L4-5) 07/05/2022   Long term prescription benzodiazepine use 07/05/2022   Chronic use of opiate for therapeutic purpose 07/05/2022   Lumbosacral facet syndrome (Bilateral) 07/05/2022   Pain medication agreement signed (12/07/21) 05/29/2022   History of breast cancer (Left) 05/29/2022   History of mastectomy (Bilateral) 05/29/2022   Chronic calf pain (2ry area  of Pain) (Bilateral) 05/29/2022   Chronic lower extremity pain (3ry area of Pain) (Bilateral) 05/29/2022   Chronic feet pain (Bilateral) 05/29/2022   Chronic upper extremity pain (4th area of Pain) (Bilateral) (L>R) 05/29/2022   Chronic hand pain (Bilateral) (L>R) 05/29/2022    Chronic generalized pain (5th area of Pain) 05/29/2022   Abnormal MRI, lumbar spine (10/13/2021) 05/29/2022   Chronic pain syndrome 05/28/2022   Pharmacologic therapy 05/28/2022   Disorder of skeletal system 05/28/2022   Problems influencing health status 05/28/2022   Family history of heart disease 11/16/2021   Acquired absence of breast 08/16/2021   B12 deficiency 07/21/2021   Atherosclerosis of aorta ([place]) 07/20/2021   Hepatic steatosis 07/20/2021   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[place]) 03/14/2021   Genetic testing 11/22/2020   Family history of ovarian cancer    Candidal vulvovaginitis 11/01/2020   Chronic low back pain (1ry area of Pain) (Bilateral) (R>L) w/o sciatica 10/06/2020   Encounter for medical examination to establish care 09/26/2020   Malignant neoplasm of upper-outer quadrant of left breast in female, estrogen receptor positive ([place]) 09/26/2020   BACK STRAIN, LUMBAR 03/08/2010   INTERNAL HEMORRHOIDS 01/27/2010   Anal fissure 01/27/2010   OSTEOARTHRITIS, HANDS, BILATERAL 01/04/2010   Arthralgia 10/05/2009   INSOMNIA, CHRONIC 05/06/2009   ALLERGIC RHINITIS, SEASONAL 02/09/2009   TOBACCO ABUSE 12/29/2008   IBS 10/13/2008   Anxiety state 08/25/2008   DEPRESSION, RECURRENT 08/25/2008    REFERRING DIAG: left breast cancer at risk for lymphedema  THERAPY DIAG:  Malignant 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, estrogen receptor positive, unspecified site of breast ([place])  PERTINENT HISTORY: : L breast cancer ER+/PR+ HER2-, stage III, underwent a bilateral mastectomy and ALND on L (7/16) on 03/17/21, reconstruction in summer 2022, neuropathy of hands, lower legs and feet, completed radiation, completed radiation Dec17, 2021 to February 18, 2021  PRECAUTIONS: left UE Lymphedema risk, None  SUBJECTIVE: They found a mass in my brain and I have to have another MRI.   PAIN:  Are you having pain?  Not rated on a 0-10 scale, pt reports cording is back, educated pt that we are here  if she needs PT for cording again  SOZO SCREENING: Patient was assessed today using the SOZO machine to determine the lymphedema index score. This was compared to her baseline score. It was determined that she is within the recommended range when compared to her baseline and no further action is needed at this time. She will continue SOZO screenings. These are done every 3 months for 2 years post operatively followed by every 6 months for 2 years, and then annually.      [employer], PT 08/15/2022, 11:03 AM

## 2022-08-15 NOTE — Progress Notes (Signed)
Amb

## 2022-08-16 ENCOUNTER — Other Ambulatory Visit: Payer: Self-pay | Admitting: Pharmacist

## 2022-08-16 ENCOUNTER — Other Ambulatory Visit: Payer: Self-pay

## 2022-08-16 ENCOUNTER — Encounter: Payer: Self-pay | Admitting: Family

## 2022-08-17 ENCOUNTER — Telehealth: Payer: Self-pay | Admitting: *Deleted

## 2022-08-17 NOTE — Telephone Encounter (Signed)
MyChart message to patient about surgery date 08/23/22. Awaiting call or reply to confirm date

## 2022-08-18 ENCOUNTER — Other Ambulatory Visit: Payer: Self-pay

## 2022-08-18 ENCOUNTER — Other Ambulatory Visit: Payer: Self-pay | Admitting: Family

## 2022-08-18 ENCOUNTER — Telehealth: Payer: Self-pay | Admitting: *Deleted

## 2022-08-18 DIAGNOSIS — G8929 Other chronic pain: Secondary | ICD-10-CM

## 2022-08-18 NOTE — Telephone Encounter (Signed)
LVM on home about sx date for 11/1

## 2022-08-21 ENCOUNTER — Other Ambulatory Visit (HOSPITAL_COMMUNITY): Payer: Self-pay

## 2022-08-22 ENCOUNTER — Encounter: Payer: Self-pay | Admitting: Occupational Therapy

## 2022-08-22 ENCOUNTER — Ambulatory Visit: Payer: No Typology Code available for payment source | Attending: Nurse Practitioner | Admitting: Occupational Therapy

## 2022-08-22 ENCOUNTER — Other Ambulatory Visit: Payer: Self-pay

## 2022-08-22 DIAGNOSIS — I972 Postmastectomy lymphedema syndrome: Secondary | ICD-10-CM | POA: Insufficient documentation

## 2022-08-22 MED ORDER — NICOTINE 21 MG/24HR TD PT24
MEDICATED_PATCH | Freq: Every day | TRANSDERMAL | 0 refills | Status: DC
  Filled 2022-08-22: qty 28, 28d supply, fill #0

## 2022-08-22 NOTE — Therapy (Signed)
OUTPATIENT OT UPPER EXTREMITY LYMPHEDEMA EVALUATION  Patient Name: Tricia Berry MRN: [id number] DOB:09/10/1971, 51 y.o., female Today's Date: 08/22/2022    OT End of Session - 08/22/22 1103     Visit Number 1    Number of Visits 36    Date for OT Re-Evaluation 11/20/22    OT Start Time 0915    OT Stop Time 1015    OT Time Calculation (min) 60 min    Activity Tolerance Patient tolerated treatment well;No increased pain    Behavior During Therapy WFL for tasks assessed/performed              Past Medical History:  Diagnosis Date   Anxiety    Breast cancer ([place])    Diverticulitis    Family history of ovarian cancer    GERD (gastroesophageal reflux disease)    IBS (irritable bowel syndrome)    PONV (postoperative nausea and vomiting)    severe migraine and vomiting post anesthesia   Scoliosis    Past Surgical History:  Procedure Laterality Date   ABDOMINAL HYSTERECTOMY     still has ovaries, no gyn cancer, hysterectomy due to endometriosis. NO cervix on exam 01/10/21   BREAST BIOPSY Left 09/15/2020   [place] bx of mass, path pending, Q marker   BREAST BIOPSY Left 09/15/2020   [place] bx of LN, hydromarker, path pending   BREAST RECONSTRUCTION WITH PLACEMENT OF TISSUE EXPANDER AND FLEX HD (ACELLULAR HYDRATED DERMIS) Bilateral 03/17/2021   Procedure: IMMEDIATE BILATERAL BREAST RECONSTRUCTION WITH PLACEMENT OF TISSUE EXPANDER AND FLEX HD (ACELLULAR HYDRATED DERMIS);  Surgeon: Wallace Going, DO;  Location: [place];  Service: Plastics;  Laterality: Bilateral;   COLONOSCOPY WITH PROPOFOL N/A 11/21/2021   Procedure: COLONOSCOPY WITH PROPOFOL;  Surgeon: Lin Landsman, MD;  Location[facility] ENDOSCOPY;  Service: Gastroenterology;  Laterality: N/A;   IR IMAGING GUIDED PORT INSERTION  10/01/2020   MODIFIED MASTECTOMY Left 03/17/2021   Procedure: LEFT MODIFIED RADICAL MASTECTOMY;  Surgeon: Erroll Luna, MD;  Location: [place];  Service:  General;  Laterality: Left;   PORTA CATH REMOVAL Right 03/17/2021   Procedure: PORTA CATH REMOVAL;  Surgeon: Erroll Luna, MD;  Location: [place];  Service: General;  Laterality: Right;   REMOVAL OF BILATERAL TISSUE EXPANDERS WITH PLACEMENT OF BILATERAL BREAST IMPLANTS Bilateral 05/09/2021   Procedure: REMOVAL OF BILATERAL TISSUE EXPANDERS WITH PLACEMENT OF BILATERAL BREAST IMPLANTS;  Surgeon: Wallace Going, DO;  Location: [place];  Service: Plastics;  Laterality: Bilateral;  90 min   TOTAL MASTECTOMY Right 03/17/2021   Procedure: TOTAL MASTECTOMY;  Surgeon: Erroll Luna, MD;  Location: [place];  Service: General;  Laterality: Right;   Patient Active Problem List   Diagnosis Date Noted   Breast asymmetry following reconstructive surgery 08/15/2022   Lumbosacral facet arthropathy (Multilevel) (Bilateral) 07/05/2022   Lumbar spinal facet joint arthropathy with concurrent effusion (L3-4) (Right) 07/05/2022   Degenerative lumbar rotatory levoscoliosis 07/05/2022   Lumbosacral foraminal stenosis (Bilateral: L3-4) (Left: L4-5, L5-S1) 07/05/2022   Lumbosacral lateral recess stenosis (Left: L4-5) (Bilateral: L5-S1) 07/05/2022   Lumbar nerve root impingement (Bilateral: L3-4, L5-S1) (Left: L4-5) 07/05/2022   Long term prescription benzodiazepine use 07/05/2022   Chronic use of opiate for therapeutic purpose 07/05/2022   Lumbosacral facet syndrome (Bilateral) 07/05/2022   Pain medication agreement signed (12/07/21) 05/29/2022   History of breast cancer (Left) 05/29/2022   History of mastectomy (Bilateral) 05/29/2022   Chronic calf pain (2ry  area of Pain) (Bilateral) 05/29/2022   Chronic lower extremity pain (3ry area of Pain) (Bilateral) 05/29/2022   Chronic feet pain (Bilateral) 05/29/2022   Chronic upper extremity pain (4th area of Pain) (Bilateral) (L>R) 05/29/2022   Chronic hand pain (Bilateral) (L>R) 05/29/2022   Chronic  generalized pain (5th area of Pain) 05/29/2022   Abnormal MRI, lumbar spine (10/13/2021) 05/29/2022   Chronic pain syndrome 05/28/2022   Pharmacologic therapy 05/28/2022   Disorder of skeletal system 05/28/2022   Problems influencing health status 05/28/2022   Family history of heart disease 11/16/2021   Acquired absence of breast 08/16/2021   B12 deficiency 07/21/2021   Atherosclerosis of aorta ([place]) 07/20/2021   Hepatic steatosis 07/20/2021   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[place]) 03/14/2021   Genetic testing 11/22/2020   Family history of ovarian cancer    Candidal vulvovaginitis 11/01/2020   Chronic low back pain (1ry area of Pain) (Bilateral) (R>L) w/o sciatica 10/06/2020   Encounter for medical examination to establish care 09/26/2020   Malignant neoplasm of upper-outer quadrant of left breast in female, estrogen receptor positive ([place]) 09/26/2020   BACK STRAIN, LUMBAR 03/08/2010   INTERNAL HEMORRHOIDS 01/27/2010   Anal fissure 01/27/2010   OSTEOARTHRITIS, HANDS, BILATERAL 01/04/2010   Arthralgia 10/05/2009   INSOMNIA, CHRONIC 05/06/2009   ALLERGIC RHINITIS, SEASONAL 02/09/2009   TOBACCO ABUSE 12/29/2008   IBS 10/13/2008   Anxiety state 08/25/2008   DEPRESSION, RECURRENT 08/25/2008    PCP: Burnard Hawthorne, FNP  REFERRING PROVIDER: Verlon Au, NP  REFERRING DIAG: 197.2  THERAPY DIAG:  LUE/ LUQ Post-mastectomy lymphedema syndrome  ONSET DATE: 03/17/21  Rationale for Evaluation and Treatment: Rehabilitation  SUBJECTIVE                                                                                                                                                                                           SUBJECTIVE STATEMENT:Tricia Berry is referred to Occupational  Therapy by Verlon Au, NP, for evaluation and treatment of LUE/LUQ post mastectomy, breast cancer-related lymphedema. Tricia Berry reports fluctuating swelling in her L hand and lateral trunk  adjacent to her chest wall and L axilla. She has pain, including numbness, tightness and pulling in the arm and trunk, especially at end ranges of shoulder and elbow AROM. Pt tells me she has been treated for "chording" before and it has not resolved. Pt reports she was recently screened for lymphedema and limb volumes were essentially symmetrical. She also recently underwent SOZO bioimpedance screening and current values are WNL, indicating lymphedema treatment is not indicated at this time. Pt states her goals are to limit lymphedema progression  and  to reduce pain in the L lateral trunk. She tells me she has capsulectomy of the R breast implant scheduled for 11/16, so this may interrupt OT.  PERTINENT HISTORY: L Breast Ca ER+, PR+, HER2-, stage III  L breast biopsy 09/15/20; Neoadjuvant chemotherapy; L modified radical mastectomy and R prophylactic total mastectomy with immediate placement of tissue expanders 03/17/21; L ALND 7+/17. XRT completed 02/18/21. Marland Kitchen  Upcoming R capsulectomy scheduled 09/07/22. Chronic pain syndrome, chronic insomnia, OA B hands, Tobacco abuse, IBS, Anxiety, Recurrent Depression   PAIN:  Are you having pain? Yes NPRS scale: not rated numerically Pain location: L lateral trunk, axilla, volar arm and forearm, chest wall Pain orientation L PAIN TYPE: pulling, discomfort, fullness, tightness Pain description: intermittent Aggravating factors: end range shoulder AROM, lifting, carrying, stretching Relieving factors: MLD, compression  Are you having pain? Yes NPRS scale: 4/10 Pain location: lower back Pain orientation: Medial  PAIN TYPE: aching Pain description: constant  Aggravating factors: weight bearing Relieving factors: medication  Are you having pain? Yes NPRS scale: 4/10 Pain location: hands Pain orientation: bilateral PAIN TYPE: burning, shooting, pins and needles, stabbing Pain description: constant  Aggravating factors: unknown Relieving factors:  medication  Are you having pain? Yes NPRS scale: 5/10 Pain location: toes Pain orientation: bilateral PAIN TYPE: burning, shooting, pins and needles, stabbing Pain description: constant  Aggravating factors: unknown Relieving factors: medication  PRECAUTIONS: Falls,  Lymphedema Precautions  WEIGHT BEARING RESTRICTIONS: No  FALLS:  Has patient fallen in last 6 months? No  LIVING ENVIRONMENT: Lives with: lives alone Lives in: House/apartment Stairs:  unknown Has following equipment at home: None  OCCUPATION: on disability; formerly ICU nurse  LEISURE: family  HAND DOMINANCE : right   PRIOR LEVEL OF FUNCTION: Independent  PATIENT GOALS: make sure lymphedema. Doesn't get worse; reduce pain and discomfort in my hands and arm and trunk   OBJECTIVE  COGNITION:  Overall cognitive status: Within functional limits for tasks assessed   OBSERVATIONS / OTHER ASSESSMENTS: well healed surgical mastectomy scars.Palpable "chording", or axillary web, in L axilla and adjacent chest wall. No visible or palpable limb swelling. Pt essentially reporting sensory symptoms associated with sub clinical lymphedema and axillary web syndrome.  SENSATION: Chemo-induced neuropathy in hands and feet Post surgical sensory changes and numbness at L chest wall, axilla, axilla and medial volar and dorsal arm  POSTURE: scoliosis  UPPER EXTREMITY AROM/PROM: WNL; shoulder pain and pulling in L axilla  and volar arm at end ranges  UPPER EXTREMITY STRENGTH: deficits  LOWER EXTREMITY AROM/PROM: WNL  LYMPHEDEMA ASSESSMENTS:  SURGERY TYPE/DATE: B radical mastectomy on L w ALND. Total mastectomy R (prophylactic); Immediate reconstruction with tissue expanders, then implants. R capsulectomy scheduled 09/07/22 2/2 breast asymmetry  NUMBER OF LYMPH NODES REMOVED:  L +7/16  CHEMOTHERAPY: neoadjuvant  RADIATION:completed   HORMONE TREATMENT: Anastrozole   INFECTIONS: denies seroma, denies  cellulitis  LYMPHEDEMA ASSESSMENTS:   FOTO FUNCTIONAL OUTCOME SCORE: Intake 59/100%  LYMPHEDEMA LIFE IMPACT SCALE (LLIS) Intake TBA 1st Rx visit. Pt did not complete backside of page  TODAY'S TREATMENT:  DATE: 08/22/22 Initial OT evaluation for LUE/LUQ Post mastectomy lymphedema Pt edu   PATIENT EDUCATION:  Education details: Provided Pt/ family education regarding lymphatic structure and function, etiology, onset patterns and stages of progression. Taught interaction between blood circulatory system and lymphatic circulation.Discussed  impact of gravity and co-morbidities on lymphatic function. Outlined Complete Decongestive Therapy (CDT)  as standard of care and provided in depth information regarding 4 primary components of Intensive and Self Management Phases, including Manual Lymph Drainage (MLD), compression wrapping and garments, skin care, and therapeutic exercise. Pilar Plate discussion with re need for frequent attendance and high burden of care when caregiver is needed, impact of co morbidities. We discussed  the chronic, progressive nature of lymphedema and Importance of daily, ongoing LE self-care essential for limiting progression and infection risk. Discussed the demanding nature of CDT and importance of high compliance with all home program components for optimal outcome. Person educated: Patient Education method: Explanation, Demonstration, and Handouts Education comprehension: verbalized understanding, returned demonstration, and needs further education  LYMPHEDEMA SELF-CARE HOME PROGRAM: Observe lymphedema precautions and prevention principals to limit progression Simple Self-Manual lymphatic drainage (MLD LUE/LUQ Lymphatic pumping there ex and soft tissue stretching there ex Daily and PRN skin care Compression arm sleeve prophylactic ly and PRN  for 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 Ensure pad covers serratus anterior and L lateral trunk as well as chest wall. Pt is of petite stature so custom pas and sleeve may be required to achieve correct fit  ASSESSMENT:  CLINICAL IMPRESSION: Bianka Liberati is a 48 y o female presenting with stage 0, subclinical LUE/LUQ, breast cancer -related, post mastectomy lymphedema and L axillary web syndrome. Risk of lymphedema progression is relatively high due to ongoing sensory LUE symptoms and guarding and history of radiation therapy with high proportion of lymph nodes removed for biopsy. Pt will benefit from skilled Occupational Therapy to reduce lymphedema progression risk , to improve LUE shoulder and elbow AROM and function arm use, to reduce L axillary and chest wall pain due to AWS,  to fit Pt with a prophylactic compression arm sleeve, compression bra and convoluted foam pad insert designed to reduce scar tissue and limit fibrosis formation. Pt will also benefit from LE self care education, including how to perform simple self-MLD, scar issue massage, flexibility there ex/ stretching, skin care and lymphatic pumping there ex to use as preventive measures PRN. Without skilled OT lymphedema may progress resulting in further functional decline.  Pt mentioned fitting with thumb splints on her hands for pain and CTS symptoms. This OT will explore this with the screening OT and refer back for hand therapy PRN.   OBJECTIVE IMPAIRMENTS: decreased activity tolerance, decreased balance, decreased endurance, decreased knowledge of condition, decreased knowledge of use of DME, decreased ROM, decreased strength, impaired flexibility, impaired sensation, impaired UE functional use, postural dysfunction, pain, and increased lymphedema progression risk .   ACTIVITY LIMITATIONS: carrying, lifting, sleeping, bed mobility,  reach over head, hygiene/grooming, caring for others, and working as Marine scientist. driving  PARTICIPATION LIMITATIONS: cleaning, laundry, driving, shopping, occupation, and yard work  PERSONAL FACTORS: 3+ comorbidities: chronic pain, lumbar stenosis and DJD, chemo-induced neuropathy in all 4 limbs  are also affecting patient's functional outcome.   REHAB POTENTIAL: Good for limiting LE progression  CLINICAL DECISION MAKING: Stable/uncomplicated  EVALUATION COMPLEXITY: Moderate  GOALS: Goals reviewed with patient? Yes  SHORT TERM GOALS: Target date: 11/14/2022    ***  Baseline: Goal status: {GOALSTATUS:25110}  2.  *** Baseline:  Goal status: {GOALSTATUS:25110}  3.  *** Baseline:  Goal status: {GOALSTATUS:25110}  4.  *** Baseline:  Goal status: {GOALSTATUS:25110}  5.  *** Baseline:  Goal status: {GOALSTATUS:25110}  6.  *** Baseline:  Goal status: {GOALSTATUS:25110}  LONG TERM GOALS: Target date: {follow up:25551}    *** Baseline:  Goal status: {GOALSTATUS:25110}  2.  *** Baseline:  Goal status: {GOALSTATUS:25110}  3.  *** Baseline:  Goal status: {GOALSTATUS:25110}  4.  *** Baseline:  Goal status: {GOALSTATUS:25110}  5.  *** Baseline:  Goal status: {GOALSTATUS:25110}  6.  *** Baseline:  Goal status: {GOALSTATUS:25110}  PLAN:  PT FREQUENCY: {rehab frequency:25116}  PT DURATION: {rehab duration:25117}  PLANNED INTERVENTIONS: {rehab planned interventions:25118::"Therapeutic exercises","Therapeutic activity","Neuromuscular re-education","Balance training","Gait training","Patient/Family education","Self Care","Joint mobilization"}  PLAN FOR NEXT SESSION: ***   Ansel Bong, OT 08/22/2022, 11:05 AM

## 2022-08-24 ENCOUNTER — Encounter: Payer: Self-pay | Admitting: Occupational Therapy

## 2022-08-24 ENCOUNTER — Ambulatory Visit: Payer: No Typology Code available for payment source | Attending: Nurse Practitioner | Admitting: Occupational Therapy

## 2022-08-24 DIAGNOSIS — I972 Postmastectomy lymphedema syndrome: Secondary | ICD-10-CM | POA: Diagnosis present

## 2022-08-24 NOTE — Therapy (Signed)
OUTPATIENT OT UPPER EXTREMITY LYMPHEDEMA EVALUATION  Patient Name: Tricia Berry MRN: [id number] DOB:12/05/1970, 51 y.o., female Today's Date: 08/24/2022    OT End of Session - 08/24/22 0901     Visit Number 2    Number of Visits 36    Date for OT Re-Evaluation 11/20/22    OT Start Time 0900    OT Stop Time 1015    OT Time Calculation (min) 75 min    Activity Tolerance Patient tolerated treatment well;No increased pain    Behavior During Therapy WFL for tasks assessed/performed              Past Medical History:  Diagnosis Date   Anxiety    Breast cancer ([place])    Diverticulitis    Family history of ovarian cancer    GERD (gastroesophageal reflux disease)    IBS (irritable bowel syndrome)    PONV (postoperative nausea and vomiting)    severe migraine and vomiting post anesthesia   Scoliosis    Past Surgical History:  Procedure Laterality Date   ABDOMINAL HYSTERECTOMY     still has ovaries, no gyn cancer, hysterectomy due to endometriosis. NO cervix on exam 01/10/21   BREAST BIOPSY Left 09/15/2020   [place] bx of mass, path pending, Q marker   BREAST BIOPSY Left 09/15/2020   [place] bx of LN, hydromarker, path pending   BREAST RECONSTRUCTION WITH PLACEMENT OF TISSUE EXPANDER AND FLEX HD (ACELLULAR HYDRATED DERMIS) Bilateral 03/17/2021   Procedure: IMMEDIATE BILATERAL BREAST RECONSTRUCTION WITH PLACEMENT OF TISSUE EXPANDER AND FLEX HD (ACELLULAR HYDRATED DERMIS);  Surgeon: Wallace Going, DO;  Location: [facility];  Service: Plastics;  Laterality: Bilateral;   COLONOSCOPY WITH PROPOFOL N/A 11/21/2021   Procedure: COLONOSCOPY WITH PROPOFOL;  Surgeon: Lin Landsman, MD;  Location: [facility] ENDOSCOPY;  Service: Gastroenterology;  Laterality: N/A;   IR IMAGING GUIDED PORT INSERTION  10/01/2020   MODIFIED MASTECTOMY Left 03/17/2021   Procedure: LEFT MODIFIED RADICAL MASTECTOMY;  Surgeon: Erroll Luna, MD;  Location: [facility];  Service:  General;  Laterality: Left;   PORTA CATH REMOVAL Right 03/17/2021   Procedure: PORTA CATH REMOVAL;  Surgeon: Erroll Luna, MD;  Location: [facility];  Service: General;  Laterality: Right;   REMOVAL OF BILATERAL TISSUE EXPANDERS WITH PLACEMENT OF BILATERAL BREAST IMPLANTS Bilateral 05/09/2021   Procedure: REMOVAL OF BILATERAL TISSUE EXPANDERS WITH PLACEMENT OF BILATERAL BREAST IMPLANTS;  Surgeon: Wallace Going, DO;  Location: [facility];  Service: Plastics;  Laterality: Bilateral;  90 min   TOTAL MASTECTOMY Right 03/17/2021   Procedure: TOTAL MASTECTOMY;  Surgeon: Erroll Luna, MD;  Location: [facility];  Service: General;  Laterality: Right;   Patient Active Problem List   Diagnosis Date Noted   Breast asymmetry following reconstructive surgery 08/15/2022   Lumbosacral facet arthropathy (Multilevel) (Bilateral) 07/05/2022   Lumbar spinal facet joint arthropathy with concurrent effusion (L3-4) (Right) 07/05/2022   Degenerative lumbar rotatory levoscoliosis 07/05/2022   Lumbosacral foraminal stenosis (Bilateral: L3-4) (Left: L4-5, L5-S1) 07/05/2022   Lumbosacral lateral recess stenosis (Left: L4-5) (Bilateral: L5-S1) 07/05/2022   Lumbar nerve root impingement (Bilateral: L3-4, L5-S1) (Left: L4-5) 07/05/2022   Long term prescription benzodiazepine use 07/05/2022   Chronic use of opiate for therapeutic purpose 07/05/2022   Lumbosacral facet syndrome (Bilateral) 07/05/2022   Pain medication agreement signed (12/07/21) 05/29/2022   History of breast cancer (Left) 05/29/2022   History of mastectomy (Bilateral) 05/29/2022   Chronic calf pain (2ry  area of Pain) (Bilateral) 05/29/2022   Chronic lower extremity pain (3ry area of Pain) (Bilateral) 05/29/2022   Chronic feet pain (Bilateral) 05/29/2022   Chronic upper extremity pain (4th area of Pain) (Bilateral) (L>R) 05/29/2022   Chronic hand pain (Bilateral) (L>R) 05/29/2022   Chronic  generalized pain (5th area of Pain) 05/29/2022   Abnormal MRI, lumbar spine (10/13/2021) 05/29/2022   Chronic pain syndrome 05/28/2022   Pharmacologic therapy 05/28/2022   Disorder of skeletal system 05/28/2022   Problems influencing health status 05/28/2022   Family history of heart disease 11/16/2021   Acquired absence of breast 08/16/2021   B12 deficiency 07/21/2021   Atherosclerosis of aorta ([place]) 07/20/2021   Hepatic steatosis 07/20/2021   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[place]) 03/14/2021   Genetic testing 11/22/2020   Family history of ovarian cancer    Candidal vulvovaginitis 11/01/2020   Chronic low back pain (1ry area of Pain) (Bilateral) (R>L) w/o sciatica 10/06/2020   Encounter for medical examination to establish care 09/26/2020   Malignant neoplasm of upper-outer quadrant of left breast in female, estrogen receptor positive ([place]) 09/26/2020   BACK STRAIN, LUMBAR 03/08/2010   INTERNAL HEMORRHOIDS 01/27/2010   Anal fissure 01/27/2010   OSTEOARTHRITIS, HANDS, BILATERAL 01/04/2010   Arthralgia 10/05/2009   INSOMNIA, CHRONIC 05/06/2009   ALLERGIC RHINITIS, SEASONAL 02/09/2009   TOBACCO ABUSE 12/29/2008   IBS 10/13/2008   Anxiety state 08/25/2008   DEPRESSION, RECURRENT 08/25/2008    PCP: Burnard Hawthorne, FNP  REFERRING PROVIDER: Verlon Au, NP  REFERRING DIAG: 197.2  THERAPY DIAG:  LUE/ LUQ Post-mastectomy lymphedema syndrome  ONSET DATE: 03/17/21  Rationale for Evaluation and Treatment: Rehabilitation  SUBJECTIVE                                                                                                                                                                                           SUBJECTIVE STATEMENT:Tricia Berry presents for OT Rx visit 1. LE-related pain is unchanged since initial evaluation. Pt has no new complaints. Pt states she is glad to get compression garment measurements underway today.  PERTINENT HISTORY: L Breast Ca ER+,  PR+, HER2-, stage III  L breast biopsy 09/15/20; Neoadjuvant chemotherapy; L modified radical mastectomy and R prophylactic total mastectomy with immediate placement of tissue expanders 03/17/21; L ALND 7+/17. XRT completed 02/18/21. Marland Kitchen  Upcoming R capsulectomy scheduled 09/07/22. Chronic pain syndrome, chronic insomnia, OA B hands, Tobacco abuse, IBS, Anxiety, Recurrent Depression   PAIN:  Are you having pain? Yes NPRS scale: not rated numerically Pain location: L lateral trunk, axilla, volar arm and forearm, chest wall Pain orientation L PAIN TYPE: pulling, discomfort, fullness,  tightness Pain description: intermittent Aggravating factors: end range shoulder AROM, lifting, carrying, stretching Relieving factors: MLD, compression  Are you having pain? Yes NPRS scale: 4/10 Pain location: lower back Pain orientation: Medial  PAIN TYPE: aching Pain description: constant  Aggravating factors: weight bearing Relieving factors: medication  Are you having pain? Yes NPRS scale: 4/10 Pain location: hands Pain orientation: bilateral PAIN TYPE: burning, shooting, pins and needles, stabbing Pain description: constant  Aggravating factors: unknown Relieving factors: medication  Are you having pain? Yes NPRS scale: 5/10 Pain location: toes Pain orientation: bilateral PAIN TYPE: burning, shooting, pins and needles, stabbing Pain description: constant  Aggravating factors: unknown Relieving factors: medication  PRECAUTIONS: Falls,  Lymphedema Precautions  HAND DOMINANCE : right   PRIOR LEVEL OF FUNCTION: Independent  PATIENT GOALS: make sure lymphedema. Doesn't get worse; reduce pain and discomfort in my hands and arm and trunk   OBJECTIVE   LYMPHEDEMA ASSESSMENTS:   FOTO FUNCTIONAL OUTCOME SCORE: Intake 59/100%  LYMPHEDEMA LIFE IMPACT SCALE (LLIS) Intake TBA 1st Rx visit. Pt did not complete backside of page  TODAY'S TREATMENT:  Pt edu for compression garment needs, options,  recommendations and ordering through DME vendor. Discussed roles of vendor, therapist and insurance billing.  Pt edu re [employer] neoprene [facility] support. Provided online resources.  Completed anatomical measurements for LUE custom compression arm sleeve, compression glove, compression bra  and convoluted foam bra insert pad to limit scar tissue and fibrosis formation and facilitate improved lymphatic circulation during HOS. Pt is small stature, so unable to fit her with off-the-shelf compression garments, which are less costly.  PATIENT EDUCATION:  Education details: Pt edu focused on differences between custom  flat knit and off-the-shelf circular elastic compression garments.  Educated Pt re indications for both and pros and cons of each, and rational for therapist's current recommendations  Educated Pt re estimated costs, measuring and fitting process ,DME vendor's involvement and access to insurance benefits. 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 Ensure pad covers serratus anterior and L lateral trunk as well as chest wall. Pt is of petite stature so custom pas and sleeve may be required to achieve correct fit  ASSESSMENT:  CLINICAL IMPRESSION: Tricia Berry is a 41 y o female presenting with stage 0, subclinical LUE/LUQ, breast cancer -related, post mastectomy lymphedema and L axillary web syndrome. Pt has high risk  of lymphedema progression and will benefit from utilizing a compression arm sleeve, glove and compression bra with convoluted foam pad insert to limit fibrosis formation and to promote  increase lymphatic flow through left breast, L trunk, L arm and hand during HOS.  Completed anatomical measurements for custom Juzo, ccl 1 (20-30 mmHg) compression arm sleeve and glove.Unable to fit with off the shelf compression garments as Pt is too petite to fit into manufacturer's size range for smallest size 1.Also completed measurements for Belisse compression bra ( size needed 32 cc/dd) and small Jovi lumpectomy pad. This style of pad is needed t=to accommodate breast reconstruction. Cont as per POC.   OBJECTIVE IMPAIRMENTS: decreased activity  tolerance, decreased balance, decreased endurance, decreased knowledge of condition, decreased knowledge of use of DME, impaired LUE shoulder AROM, decreased strength, impaired flexibility, impaired sensation, impaired UE functional use, postural dysfunction, impaired dynamic balance, muscle weakness, pain, and increased lymphedema progression risk.   ACTIVITY LIMITATIONS: carrying, lifting, sleeping, bed mobility, reach over head, hygiene/grooming, caring for others, working as Marine scientist, driving, meal prep, cooking  PARTICIPATION LIMITATIONS: cleaning, laundry, driving, shopping, occupation, and yard work  PERSONAL FACTORS: 3+ comorbidities: chronic pain, lumbar stenosis, chemo-induced neuropathy in all 4 limbs  are also affecting patient's functional outcome.   REHAB POTENTIAL: Good for limiting LE progression   GOALS: Goals reviewed with patient? Yes  SHORT TERM GOALS: Target date: 4th OT Rx visit    Pt will demonstrate understanding of lymphedema precautions and prevention strategies with modified independence using a printed reference to identify at least 5 precautions and discussing how s/he may implement them into daily life to reduce risk of progression and to  limit infection risk. Baseline:Max A Goal status: INITIAL   LONG TERM GOALS: Target date: 11/16/2022    Given this patient's Intake score of 59/100% on the functional outcomes FOTO tool, patient will experience an increase in function of 3 points  to improve basic and instrumental ADLs performance, including lymphedema self-care. Baseline: 59/100% Goal status: INITIAL  2.  Given this patient's Intake score of TBA/100% on the Lymphedema Life Impact Scale (LLIS), patient will experience an increase of 5 points in her perceived level of functional impairment resulting from lymphedema to improve functional performance and quality of life (QOL). Baseline: TBA Goal status: INITIAL  3.  With assistive devices and modified independence (extra time) Pt will be able to don and doff appropriate compression garments and/or devices to control BLE lymphedema and to limit progression.  Baseline: Max A Goal status: INITIAL  4.  Pt will be able to correctly perform all lymphedema self-care home program components using correct techniques with extra time and assistive devices PRN (modified independence), including simple self MLD, lymphatic pumping exercise, don and doff appropriate compression garments/ devices, and perform daily skin care regime to limit progression. Baseline: Max A Goal status: INITIAL  PLAN:  PT FREQUENCY: 1-2x/week  PT DURATION: 12 weeks  PLANNED INTERVENTIONS: Therapeutic exercises, Therapeutic activity, Patient/Family education, Self Care, DME instructions, Manual lymph drainage, Taping, Manual therapy, and compression garment measurement , fitting and training  PLAN FOR NEXT SESSION:  Complete compression glove measurements Further assess AWS Have Pt complete LLIS Complete initial volumetrics using percentages and limb volume differential vs circumferences.  Andrey Spearman, MS, OTR/L, CLT-LANA 08/24/22 1:43 PM

## 2022-08-25 ENCOUNTER — Other Ambulatory Visit: Payer: Self-pay

## 2022-08-25 ENCOUNTER — Other Ambulatory Visit (HOSPITAL_COMMUNITY): Payer: Self-pay

## 2022-08-28 ENCOUNTER — Other Ambulatory Visit: Payer: Self-pay

## 2022-08-28 ENCOUNTER — Ambulatory Visit (INDEPENDENT_AMBULATORY_CARE_PROVIDER_SITE_OTHER): Payer: No Typology Code available for payment source | Admitting: Student

## 2022-08-28 ENCOUNTER — Telehealth: Payer: Self-pay | Admitting: Pain Medicine

## 2022-08-28 VITALS — BP 100/64 | HR 86 | Ht 60.0 in | Wt 118.2 lb

## 2022-08-28 DIAGNOSIS — N651 Disproportion of reconstructed breast: Secondary | ICD-10-CM

## 2022-08-28 MED ORDER — CEPHALEXIN 500 MG PO CAPS
500.0000 mg | ORAL_CAPSULE | Freq: Four times a day (QID) | ORAL | 0 refills | Status: DC
  Filled 2022-08-28: qty 12, 3d supply, fill #0

## 2022-08-28 NOTE — Progress Notes (Signed)
Patient ID: Tricia Berry, female    DOB: 01-18-1971, 51 y.o.   MRN: [id number]  Chief Complaint  Patient presents with   Pre-op Exam      ICD-10-CM   1. Breast asymmetry following reconstructive surgery  N65.1        History of Present Illness: Tricia Berry is a 51 y.o.  female  with a history of breast cancer.  She presents for preoperative evaluation for upcoming procedure, right breast capsule release with adjustment, scheduled for 09/06/2022 with Dr. Marla Roe.  Patient reports that she has had anesthesia before and sometimes experiences some postoperative nausea and vomiting.  Patient denies any history of cardiac disease.  She denies taking any blood thinners.  Patient reports that she is a current smoker.  She states her last cigarette was this morning.  She also states that she uses a nicotine patch.  Patient denies any use of birth control or hormone replacement.  She denies any history of miscarriages.  She denies any personal or family history of blood clots or clotting diseases.  She denies any recent traumas, infections, hospitalizations or surgeries.  She denies any history of heart attack or stroke.  She denies any history of COPD or asthma.   Patient does report she has recently had a cough and some shortness of breath.  She denies any chest pain.  She denies any dizziness or lightheadedness.  She reports that she did have a fever last week.  She denies any current fevers or chills.  Patient's oxygenation in the clinic was in the high 80s/low 90s.  Oxygenation was monitored for few minutes and went up to 93/94%.   Summary of Previous Visit: Patient was last seen in the clinic on 08/15/2022 with Dr. Marla Roe.  Patient reported that the right implant was low and she was interested in having it lifted.  It was discussed with the patient that she should not do fat grafting and capsular adjustment at the same time.  Patient was okay with this plan.  She also  reported that she was taking Verzenio and had clearance to hold it 2 weeks before and 2 weeks after surgery.  Plan was to move forward with release of capsule and readjustment of the Right Breast Implant.  Job: Not working at this time  [place] Significant for: Breast cancer, chronic pain syndrome, anxiety, depression, osteoarthritis   Past Medical History: Allergies: Allergies  Allergen Reactions   Morphine Nausea And Vomiting and Other (See Comments)    migranes Other reaction(s): Headache Migraine, vomiting   Sertraline Hcl     REACTION: Worsened symptoms of IBS   Sulfa Antibiotics Rash   Sulfamethoxazole Rash   Sulfonamide Derivatives Rash    Current Medications:  Current Outpatient Medications:    abemaciclib (VERZENIO) 100 MG tablet, Take 1 tablet (100 mg total) by mouth 2 (two) times daily., Disp: 56 tablet, Rfl: 1   amitriptyline (ELAVIL) 50 MG tablet, Take 1 tablet (50 mg total) by mouth at bedtime., Disp: 60 tablet, Rfl: 1   anastrozole (ARIMIDEX) 1 MG tablet, Take 1 tablet (1 mg total) by mouth daily., Disp: 30 tablet, Rfl: 3   b complex vitamins capsule, Take 1 capsule by mouth daily., Disp: , Rfl:    baclofen (LIORESAL) 10 MG tablet, Take 1 tablet (10 mg total) by mouth 3 (three) times daily as needed for muscle spasms., Disp: 15 tablet, Rfl: 1   CALCIUM-VITAMIN D PO, Take 1 tablet by mouth daily  at 12 noon., Disp: , Rfl:    cephALEXin (KEFLEX) 500 MG capsule, Take 1 capsule (500 mg total) by mouth 4 (four) times daily for 3 days., Disp: 12 capsule, Rfl: 0   cyanocobalamin (VITAMIN B12) 1000 MCG/ML injection, 1000 mcg (1 mL) intramuscular injection in the thigh ( vastus lateralis) once per month., Disp: 3 mL, Rfl: 4   FLUoxetine (PROZAC) 20 MG capsule, Take 1 capsule (20 mg total) by mouth every morning., Disp: 90 capsule, Rfl: 3   gentamicin ointment (GARAMYCIN) 0.1 %, Apply to nose three times daily for 2 weeks then as needed, Disp: 30 g, Rfl: 12   Lactobacillus  (PROBIOTIC ACIDOPHILUS PO), Take 1 capsule by mouth daily., Disp: , Rfl:    LORazepam (ATIVAN) 0.5 MG tablet, Take 1 tablet (0.5 mg total) by mouth at bedtime., Disp: 30 tablet, Rfl: 2   Multiple Vitamins-Minerals (MULTIVITAL PO), Take 1 Dose by mouth daily., Disp: , Rfl:    naloxone (NARCAN) nasal spray 4 mg/0.1 mL, Place 1 spray into the nose as needed for opioid-induced respiratory depresssion. In case of emergency (overdose), spray once into each nostril. If no response within 3 minutes, repeat application and call [phone]., Disp: 2 each, Rfl: 0   nicotine (NICODERM CQ - DOSED IN MG/24 HOURS) 21 mg/24hr patch, use 1 patch daily, Disp: 28 patch, Rfl: 0   olopatadine (PATADAY) 0.1 % ophthalmic solution, 1 drop 2 (two) times daily., Disp: , Rfl:    ondansetron (ZOFRAN-ODT) 8 MG disintegrating tablet, Take 1 tablet (8 mg total) by mouth every 8 (eight) hours as needed for nausea or vomiting., Disp: 45 tablet, Rfl: 2   oxyCODONE (OXY IR/ROXICODONE) 5 MG immediate release tablet, Take 1 tablet (5 mg total) by mouth 2 (two) times daily. Must last 30 days., Disp: 60 tablet, Rfl: 0   pregabalin (LYRICA) 150 MG capsule, Take 1 capsule (150 mg total) by mouth in the morning, at noon, and at bedtime., Disp: 90 capsule, Rfl: 2   rosuvastatin (CRESTOR) 5 MG tablet, Take 1 tablet (5 mg total) by mouth every evening. (Patient taking differently: Take 5 mg by mouth every evening. Taking twice weekly), Disp: 90 tablet, Rfl: 3   tiZANidine (ZANAFLEX) 4 MG tablet, Take 1 tablet (4 mg total) by mouth 3 (three) times daily., Disp: 30 tablet, Rfl: 0   trimethoprim (TRIMPEX) 100 MG tablet, Take 1 tablet (100 mg total) by mouth daily., Disp: 90 tablet, Rfl: 3   VITAMIN D PO, Take 2,000 Int'l Units/day by mouth daily at 12 noon., Disp: , Rfl:    Oxycodone HCl 10 MG TABS, Take 1 tablet (10 mg total) by mouth daily. Must last 30 days., Disp: 30 tablet, Rfl: 0 No current facility-administered medications for this  visit.  Facility-Administered Medications Ordered in Other Visits:    acetaminophen (TYLENOL) 325 MG tablet, , , ,    diphenhydrAMINE (BENADRYL) 25 mg capsule, , , ,    goserelin (ZOLADEX) injection 3.6 mg, 3.6 mg, Subcutaneous, Q28 days, Sindy Guadeloupe, MD, 3.6 mg at 05/04/21 1150   heparin lock flush 100 unit/mL, 500 Units, Intravenous, Once, Sindy Guadeloupe, MD   Zoledronic Acid (ZOMETA) 4 MG/100ML IVPB, , , ,   Past Medical Problems: Past Medical History:  Diagnosis Date   Anxiety    Breast cancer ([place])    Diverticulitis    Family history of ovarian cancer    GERD (gastroesophageal reflux disease)    IBS (irritable bowel syndrome)    PONV (postoperative  nausea and vomiting)    severe migraine and vomiting post anesthesia   Scoliosis     Past Surgical History: Past Surgical History:  Procedure Laterality Date   ABDOMINAL HYSTERECTOMY     still has ovaries, no gyn cancer, hysterectomy due to endometriosis. NO cervix on exam 01/10/21   BREAST BIOPSY Left 09/15/2020   [place] bx of mass, path pending, Q marker   BREAST BIOPSY Left 09/15/2020   [place] bx of LN, hydromarker, path pending   BREAST RECONSTRUCTION WITH PLACEMENT OF TISSUE EXPANDER AND FLEX HD (ACELLULAR HYDRATED DERMIS) Bilateral 03/17/2021   Procedure: IMMEDIATE BILATERAL BREAST RECONSTRUCTION WITH PLACEMENT OF TISSUE EXPANDER AND FLEX HD (ACELLULAR HYDRATED DERMIS);  Surgeon: Wallace Going, DO;  Location: [place];  Service: Plastics;  Laterality: Bilateral;   COLONOSCOPY WITH PROPOFOL N/A 11/21/2021   Procedure: COLONOSCOPY WITH PROPOFOL;  Surgeon: Lin Landsman, MD;  Location[facility] END[facility]COPY;  Service: Gastroenterology;  Laterality: N/A;   IR IMAGING GUIDED PORT INSERTION  10/01/2020   MODIFIED MASTECTOMY Left 03/17/2021   Procedure: LEFT MODIFIED RADICAL MASTECTOMY;  Surgeon: Erroll Luna, MD;  Location: [place];  Service: General;  Laterality: Left;   PORTA CATH REMOVAL  Right 03/17/2021   Procedure: PORTA CATH REMOVAL;  Surgeon: Erroll Luna, MD;  Location: [place];  Service: General;  Laterality: Right;   REMOVAL OF BILATERAL TISSUE EXPANDERS WITH PLACEMENT OF BILATERAL BREAST IMPLANTS Bilateral 05/09/2021   Procedure: REMOVAL OF BILATERAL TISSUE EXPANDERS WITH PLACEMENT OF BILATERAL BREAST IMPLANTS;  Surgeon: Wallace Going, DO;  Location: [place];  Service: Plastics;  Laterality: Bilateral;  90 min   TOTAL MASTECTOMY Right 03/17/2021   Procedure: TOTAL MASTECTOMY;  Surgeon: Erroll Luna, MD;  Location: [place];  Service: General;  Laterality: Right;    Social History: Social History   Socioeconomic History   Marital status: Divorced    Spouse name: Not on file   Number of children: 2   Years of education: Not on file   Highest education level: Not on file  Occupational History   Occupation: Nurse    Employer: [employer]  Tobacco Use   Smoking status: Some Days    Packs/day: 0.25    Types: Cigarettes   Smokeless tobacco: Never   Tobacco comments:    Pt trying to quit now. Has reduced amount significantly  Vaping Use   Vaping Use: Never used  Substance and Sexual Activity   Alcohol use: Not Currently   Drug use: No   Sexual activity: Not Currently    Birth control/protection: None  Other Topics Concern   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 She and her spouse are primary caregivers.    Social Determinants of Health   Financial Resource Strain: Not on file  Food Insecurity: Not on file  Transportation Needs: Not on file  Physical Activity: Not on file  Stress: Not on file  Social Connections: Not on file  Intimate Partner Violence: Not on file    Family History: Family History  Problem Relation Age of Onset   Hypertension Mother    Osteoarthritis Mother    Diverticulitis  Mother    Heart failure Father    Hypertension Father    Gout Father    Heart attack Father 29   Diverticulitis Brother    Ovarian cancer  Paternal Grandmother     Review of Systems: Denies any chest pain, current fevers or chills.  She reports cough and intermittent shortness of breath.  Physical Exam: Vital Signs BP 100/64 (BP Location: Right Arm, Patient Position: Sitting, Cuff Size: Small)   Pulse 86   Ht 5' (1.524 m)   Wt 118 lb 3.2 oz (53.6 kg)   SpO2 91%   BMI 23.08 kg/m   Physical Exam  Constitutional:      General: Not in acute distress.    Appearance: Normal appearance. Not ill-appearing.  HENT:     Head: Normocephalic and atraumatic.  Neck:     Musculoskeletal: Normal range of motion.  Cardiovascular:     Rate and Rhythm: Normal rate Pulmonary:     Effort: Pulmonary effort is normal. No respiratory distress.  Musculoskeletal: Normal range of motion.  Lower Extremities: Nonswollen, no varicose veins noted bilaterally. Skin:    General: Skin is warm and dry.     Findings: No erythema or rash.  Neurological:     Mental Status: Alert and oriented to person, place, and time. Mental status is at baseline.  Psychiatric:        Mood and Affect: Mood normal.        Behavior: Behavior normal.    Assessment/Plan: The patient is scheduled for right breast capsular release with adjustment with Dr. Marla Roe.  Risks, benefits, and alternatives of procedure discussed, questions answered and consent obtained.    I discussed with the patient that she should call her primary care provider today and get in with them regarding her lower oxygenation levels.  I discussed with the patient that if she develops any worsening shortness of breath, chest pain, fevers, cough or any worsening symptoms that she should go to the emergency room.  Patient expressed understanding.  She states she is going to call her PCP after today's visit.  I will also send a clearance to the patient's PCP  for upcoming surgery.  Smoking Status: Current smoker; Counseling Given?  I discussed with the patient that she should stop smoking now as smoking can increase the risk of complications including delayed wound healing.  I also discussed with the patient that she should hold her nicotine patch as nicotine-containing products can also contribute to complications as well.  Patient expressed understanding and states that she is going to stop smoking now.  I discussed that she should also stop smoking in the postoperative period as well.  I discussed with the patient that given her recent symptoms and that her last cigarette was this morning, I am going to discuss moving forward with surgery with Dr. Marla Roe.  Discussed with the patient that there is a possibility her surgery could get postponed.  Patient expressed understanding.  Caprini Score: 6; Risk Factors include: Age, history of cancer, current smoker, and length of planned surgery. Recommendation for mechanical prophylaxis. Encourage early ambulation.   Pictures obtained: Today  Post-op Rx sent to pharmacy:  Keflex  Patient receives oxy 5 mg from her pain management doctor.  I discussed with the patient that she can reach out to pain management if she feels she needs more pain medication in the postoperative period.  Patient was in agreement with this plan.  Patient also reports that she has plenty of Zofran at home that she can take postoperatively.  Patient to hold Verzenio for 2 weeks before and 2 weeks after surgery and has been holding it.  I discussed with the patient to  hold ibuprofen 1 week before surgery.  I discussed with the patient to hold multivitamins, vitamins and dietary supplements 1 week before surgery.  I discussed with the patient to hold Valium, oxycodone, Zanaflex, Flexeril the morning of surgery.  Patient was provided with the General Surgical Risk consent document and Pain Medication Agreement prior to their appointment.   They had adequate time to read through the risk consent documents and Pain Medication Agreement. We also discussed them in person together during this preop appointment. All of their questions were answered to their satisfaction.  Recommended calling if they have any further questions.  Risk consent form and Pain Medication Agreement to be scanned into patient's chart.  The consent was obtained with risks and complications reviewed which included bleeding, pain, scar, infection and the risk of anesthesia.  The patients questions were answered to the patients expressed satisfaction.    Electronically signed by: Clance Boll, PA-C 08/28/2022 4:18 PM

## 2022-08-28 NOTE — Telephone Encounter (Signed)
Please give patient a call, patient has some questions that she will like to ask nurse about what medications she can or can't take the day of her procedure. Thanks

## 2022-08-28 NOTE — Telephone Encounter (Signed)
Questions answered. Instructed her to take all necessary meds with a small sip of water.

## 2022-08-29 ENCOUNTER — Ambulatory Visit: Payer: No Typology Code available for payment source | Admitting: Occupational Therapy

## 2022-08-29 ENCOUNTER — Other Ambulatory Visit: Payer: Self-pay

## 2022-08-29 ENCOUNTER — Telehealth: Payer: Self-pay

## 2022-08-29 ENCOUNTER — Ambulatory Visit (INDEPENDENT_AMBULATORY_CARE_PROVIDER_SITE_OTHER): Payer: No Typology Code available for payment source | Admitting: Family Medicine

## 2022-08-29 ENCOUNTER — Encounter: Payer: Self-pay | Admitting: Oncology

## 2022-08-29 ENCOUNTER — Ambulatory Visit (INDEPENDENT_AMBULATORY_CARE_PROVIDER_SITE_OTHER)
Admission: RE | Admit: 2022-08-29 | Discharge: 2022-08-29 | Disposition: A | Discharge status: Home / Self Care | Payer: No Typology Code available for payment source | Source: Ambulatory Visit | Attending: Family Medicine | Admitting: Family Medicine

## 2022-08-29 ENCOUNTER — Encounter: Payer: Self-pay | Admitting: Family Medicine

## 2022-08-29 VITALS — BP 120/68 | HR 79 | Temp 97.9°F | Ht 60.0 in | Wt 117.1 lb

## 2022-08-29 DIAGNOSIS — J189 Pneumonia, unspecified organism: Secondary | ICD-10-CM | POA: Diagnosis not present

## 2022-08-29 DIAGNOSIS — F17200 Nicotine dependence, unspecified, uncomplicated: Secondary | ICD-10-CM

## 2022-08-29 DIAGNOSIS — R0902 Hypoxemia: Secondary | ICD-10-CM | POA: Diagnosis not present

## 2022-08-29 DIAGNOSIS — R051 Acute cough: Secondary | ICD-10-CM

## 2022-08-29 DIAGNOSIS — R079 Chest pain, unspecified: Secondary | ICD-10-CM

## 2022-08-29 DIAGNOSIS — R0602 Shortness of breath: Secondary | ICD-10-CM | POA: Insufficient documentation

## 2022-08-29 DIAGNOSIS — C50412 Malignant neoplasm of upper-outer quadrant of left female breast: Secondary | ICD-10-CM

## 2022-08-29 DIAGNOSIS — I972 Postmastectomy lymphedema syndrome: Secondary | ICD-10-CM

## 2022-08-29 DIAGNOSIS — R040 Epistaxis: Secondary | ICD-10-CM

## 2022-08-29 DIAGNOSIS — Z170 Estrogen receptor positive status [ER+]: Secondary | ICD-10-CM

## 2022-08-29 DIAGNOSIS — J984 Other disorders of lung: Secondary | ICD-10-CM | POA: Insufficient documentation

## 2022-08-29 LAB — CBC WITH DIFFERENTIAL/PLATELET
Basophils Absolute: 0 10*3/uL (ref 0.0–0.1)
Basophils Relative: 0.4 % (ref 0.0–3.0)
Eosinophils Absolute: 0 10*3/uL (ref 0.0–0.7)
Eosinophils Relative: 0.4 % (ref 0.0–5.0)
HCT: 30.7 % — ABNORMAL LOW (ref 36.0–46.0)
Hemoglobin: 10.5 g/dL — ABNORMAL LOW (ref 12.0–15.0)
Lymphocytes Relative: 48.1 % — ABNORMAL HIGH (ref 12.0–46.0)
Lymphs Abs: 1.4 10*3/uL (ref 0.7–4.0)
MCHC: 34.1 g/dL (ref 30.0–36.0)
MCV: 96.6 fl (ref 78.0–100.0)
Monocytes Absolute: 0.3 10*3/uL (ref 0.1–1.0)
Monocytes Relative: 11.7 % (ref 3.0–12.0)
Neutro Abs: 1.1 10*3/uL — ABNORMAL LOW (ref 1.4–7.7)
Neutrophils Relative %: 39.4 % — ABNORMAL LOW (ref 43.0–77.0)
Platelets: 167 10*3/uL (ref 150.0–400.0)
RBC: 3.18 Mil/uL — ABNORMAL LOW (ref 3.87–5.11)
RDW: 16.4 % — ABNORMAL HIGH (ref 11.5–15.5)
WBC: 2.9 10*3/uL — ABNORMAL LOW (ref 4.0–10.5)

## 2022-08-29 LAB — BASIC METABOLIC PANEL
BUN: 6 mg/dL (ref 6–23)
CO2: 32 mEq/L (ref 19–32)
Calcium: 8.8 mg/dL (ref 8.4–10.5)
Chloride: 102 mEq/L (ref 96–112)
Creatinine, Ser: 0.89 mg/dL (ref 0.40–1.20)
GFR: 74.6 mL/min (ref >60.00)
Glucose, Bld: 90 mg/dL (ref 70–99)
Potassium: 3.9 mEq/L (ref 3.5–5.1)
Sodium: 141 mEq/L (ref 135–145)

## 2022-08-29 LAB — POCT INFLUENZA A/B
Influenza A, POC: NEGATIVE
Influenza B, POC: NEGATIVE

## 2022-08-29 LAB — D-DIMER, QUANTITATIVE: D-Dimer, Quant: 1.9 mcg/mL FEU — ABNORMAL HIGH (ref <0.50)

## 2022-08-29 MED ORDER — AZITHROMYCIN 250 MG PO TABS
ORAL_TABLET | ORAL | 0 refills | Status: DC
  Filled 2022-08-29: qty 6, 5d supply, fill #0

## 2022-08-29 MED ORDER — AMOXICILLIN-POT CLAVULANATE 875-125 MG PO TABS
1.0000 | ORAL_TABLET | Freq: Two times a day (BID) | ORAL | 0 refills | Status: DC
  Filled 2022-08-29: qty 20, 10d supply, fill #0

## 2022-08-29 MED ORDER — CHERATUSSIN AC 100-10 MG/5ML PO SOLN
5.0000 mL | Freq: Two times a day (BID) | ORAL | 0 refills | Status: DC | PRN
  Filled 2022-08-29: qty 120, 12d supply, fill #0

## 2022-08-29 NOTE — Assessment & Plan Note (Signed)
Dyspnea hypoxia and cough present for the past week.  Flu swab negative today.  Home COVID swab negative as well.  Cancer hx and on meds that increase blood clot risk - check D dimer. If positive, discussed need for CTA chest r/o PE.  See above.

## 2022-08-29 NOTE — Assessment & Plan Note (Addendum)
CXR suspicious for R sided pneumonia in setting of cough, hypoxia, dyspnea and fever/chill at home. Infiltrates not present on thoracic films from 05/2022.  Rx augmentin + azithromycin antibiotic treatment. Rx cheratussin PRN cough.  Discussed hold UTIppx TMP while on these antibiotics. Comorbidities present include immunocompromised status due to active breast cancer currently on chemotherapy, long smoking history.  Rec f/u with PCP next week for recheck, seek further evaluation if worsening symptoms.

## 2022-08-29 NOTE — Telephone Encounter (Signed)
Faxed surgical clearnce to Mable Paris, FNP

## 2022-08-29 NOTE — Patient Instructions (Addendum)
Xray today, labs today.  Start antibiotics sent to pharmacy.  We will await lab results and may order CT chest for further evaluation.  Schedule follow up appointment with Mable Paris.

## 2022-08-29 NOTE — Telephone Encounter (Signed)
Kayla of [facility] is calling with critical lab results for pt.  States pt's D-dimer is 1.90.

## 2022-08-29 NOTE — Assessment & Plan Note (Signed)
Encouraged ongoing cessation efforts. She is down to 5-10 cig/day, using nicoderm patches.

## 2022-08-29 NOTE — Progress Notes (Signed)
Patient ID: Tricia Berry, female    DOB: 1971/06/12, 51 y.o.   MRN: [id number]  This visit was conducted in person.  BP 120/68   Pulse 79   Temp 97.9 F (36.6 C) (Temporal)   Ht 5' (1.524 m)   Wt 117 lb 2 oz (53.1 kg)   SpO2 95%   BMI 22.87 kg/m    CC: cough with shortness of breath Subjective:   HPI: Tricia Berry is a 51 y.o. female presenting on 08/29/2022 for Shortness of Breath (C/o SOB, cough- worse at night, chills and nosebleeds.  Sxs started 08/23/22.  Neg home COVID test on 08/26/22. )   Steffanie Dunn has a past medical history of Anxiety, Breast cancer ([place]), Diverticulitis, Family history of ovarian cancer, GERD (gastroesophageal reflux disease), IBS (irritable bowel syndrome), PONV (postoperative nausea and vomiting), and Scoliosis.   6d h/o cough, body aches, fevers/chills. Dry cough worse at night time or when supine.  Also gets very short winded in evenings. Some sore throat and PNdrainage.  Notes increased nosebleeds as well, but this has been present over the past month.  Head > chest congestion.  Worsening sinus headaches for the past week.  She's been taking tylenol for this.   No ear pain. No leg swelling or recent prolonged periods of immobility.  No sick contacts No h/o asthma or COPD. Smoking currently 5-10 cig/day - using nicoderm cq patches to help her quit.   She is on arimidex and abemaciclib for known left breast cancer. Abnormal MRI - evaluating for possible met.   Upcoming plastic surgery for breast reconstruction scheduled next week.  Was found to have hypoxia yesterday to 80s% at surgeon's preop visit, referred for evaluation.  Negative COVID test last week at home.      Relevant past medical, surgical, family and social history reviewed and updated as indicated. Interim medical history since our last visit reviewed. Allergies and medications reviewed and updated. Outpatient Medications Prior to Visit  Medication Sig Dispense Refill    abemaciclib (VERZENIO) 100 MG tablet Take 1 tablet (100 mg total) by mouth 2 (two) times daily. 56 tablet 1   amitriptyline (ELAVIL) 50 MG tablet Take 1 tablet (50 mg total) by mouth at bedtime. 60 tablet 1   anastrozole (ARIMIDEX) 1 MG tablet Take 1 tablet (1 mg total) by mouth daily. 30 tablet 3   b complex vitamins capsule Take 1 capsule by mouth daily.     baclofen (LIORESAL) 10 MG tablet Take 1 tablet (10 mg total) by mouth 3 (three) times daily as needed for muscle spasms. 15 tablet 1   CALCIUM-VITAMIN D PO Take 1 tablet by mouth daily at 12 noon.     cephALEXin (KEFLEX) 500 MG capsule Take 1 capsule (500 mg total) by mouth 4 (four) times daily for 3 days. 12 capsule 0   cyanocobalamin (VITAMIN B12) 1000 MCG/ML injection 1000 mcg (1 mL) intramuscular injection in the thigh ( vastus lateralis) once per month. 3 mL 4   FLUoxetine (PROZAC) 20 MG capsule Take 1 capsule (20 mg total) by mouth every morning. 90 capsule 3   gentamicin ointment (GARAMYCIN) 0.1 % Apply to nose three times daily for 2 weeks then as needed 30 g 12   Lactobacillus (PROBIOTIC ACIDOPHILUS PO) Take 1 capsule by mouth daily.     LORazepam (ATIVAN) 0.5 MG tablet Take 1 tablet (0.5 mg total) by mouth at bedtime. 30 tablet 2   Multiple Vitamins-Minerals (MULTIVITAL PO) Take 1  Dose by mouth daily.     naloxone (NARCAN) nasal spray 4 mg/0.1 mL Place 1 spray into the nose as needed for opioid-induced respiratory depresssion. In case of emergency (overdose), spray once into each nostril. If no response within 3 minutes, repeat application and call [phone]. 2 each 0   nicotine (NICODERM CQ - DOSED IN MG/24 HOURS) 21 mg/24hr patch use 1 patch daily 28 patch 0   olopatadine (PATADAY) 0.1 % ophthalmic solution 1 drop 2 (two) times daily.     ondansetron (ZOFRAN-ODT) 8 MG disintegrating tablet Take 1 tablet (8 mg total) by mouth every 8 (eight) hours as needed for nausea or vomiting. 45 tablet 2   oxyCODONE (OXY IR/ROXICODONE) 5 MG  immediate release tablet Take 1 tablet (5 mg total) by mouth 2 (two) times daily. Must last 30 days. 60 tablet 0   pregabalin (LYRICA) 150 MG capsule Take 1 capsule (150 mg total) by mouth in the morning, at noon, and at bedtime. 90 capsule 2   rosuvastatin (CRESTOR) 5 MG tablet Take 1 tablet (5 mg total) by mouth every evening. (Patient taking differently: Take 5 mg by mouth every evening. Taking twice weekly) 90 tablet 3   tiZANidine (ZANAFLEX) 4 MG tablet Take 1 tablet (4 mg total) by mouth 3 (three) times daily. 30 tablet 0   trimethoprim (TRIMPEX) 100 MG tablet Take 1 tablet (100 mg total) by mouth daily. 90 tablet 3   VITAMIN D PO Take 2,000 Int'l Units/day by mouth daily at 12 noon.     Oxycodone HCl 10 MG TABS Take 1 tablet (10 mg total) by mouth daily. Must last 30 days. 30 tablet 0   Facility-Administered Medications Prior to Visit  Medication Dose Route Frequency Provider Last Rate Last Admin   acetaminophen (TYLENOL) 325 MG tablet            diphenhydrAMINE (BENADRYL) 25 mg capsule            goserelin (ZOLADEX) injection 3.6 mg  3.6 mg Subcutaneous Q28 days Sindy Guadeloupe, MD   3.6 mg at 05/04/21 1150   heparin lock flush 100 unit/mL  500 Units Intravenous Once Sindy Guadeloupe, MD       Zoledronic Acid (ZOMETA) 4 MG/100ML IVPB              Per HPI unless specifically indicated in ROS section below Review of Systems  Objective:  BP 120/68   Pulse 79   Temp 97.9 F (36.6 C) (Temporal)   Ht 5' (1.524 m)   Wt 117 lb 2 oz (53.1 kg)   SpO2 95%   BMI 22.87 kg/m   Wt Readings from Last 3 Encounters:  08/29/22 117 lb 2 oz (53.1 kg)  08/28/22 118 lb 3.2 oz (53.6 kg)  08/14/22 117 lb (53.1 kg)      Physical Exam Vitals and nursing note reviewed.  Constitutional:      Appearance: Normal appearance. She is not ill-appearing.     Comments: Tired appearing  HENT:     Head: Normocephalic and atraumatic.     Right Ear: Tympanic membrane, ear canal and external ear normal. There  is no impacted cerumen.     Left Ear: Tympanic membrane, ear canal and external ear normal. There is no impacted cerumen.     Nose: Mucosal edema present. No congestion or rhinorrhea.     Right Nostril: Epistaxis present.     Left Nostril: Epistaxis present.     Right Turbinates: Not  enlarged or swollen.     Left Turbinates: Not enlarged or swollen.     Right Sinus: Maxillary sinus tenderness and frontal sinus tenderness present.     Left Sinus: Maxillary sinus tenderness and frontal sinus tenderness present.     Comments: Dry blood bilateral nares    Mouth/Throat:     Mouth: Mucous membranes are moist.     Pharynx: Oropharynx is clear. No oropharyngeal exudate or posterior oropharyngeal erythema.  Eyes:     Extraocular Movements: Extraocular movements intact.     Conjunctiva/sclera: Conjunctivae normal.     Pupils: Pupils are equal, round, and reactive to light.  Cardiovascular:     Rate and Rhythm: Normal rate and regular rhythm.     Pulses: Normal pulses.     Heart sounds: Normal heart sounds. No murmur heard. Pulmonary:     Effort: Pulmonary effort is normal. No respiratory distress.     Breath sounds: Rhonchi (few on right) present. No wheezing or rales.  Musculoskeletal:     Right lower leg: No edema.     Left lower leg: No edema.  Lymphadenopathy:     Head:     Right side of head: No submental, submandibular, tonsillar, preauricular or posterior auricular adenopathy.     Left side of head: No submental, submandibular, tonsillar, preauricular or posterior auricular adenopathy.     Cervical: No cervical adenopathy.     Right cervical: No superficial cervical adenopathy.    Left cervical: No superficial cervical adenopathy.     Upper Body:     Right upper body: No supraclavicular adenopathy.     Left upper body: No supraclavicular adenopathy.  Skin:    Findings: No rash.  Neurological:     Mental Status: She is alert.  Psychiatric:        Mood and Affect: Mood normal.         Behavior: Behavior normal.       Results for orders placed or performed in visit on 08/29/22  Influenza A/B  Result Value Ref Range   Influenza A, POC Negative Negative   Influenza B, POC Negative Negative   *Note: Due to a large number of results and/or encounters for the requested time period, some results have not been displayed. A complete set of results can be found in Results Review.   Lab Results  Component Value Date   CREATININE 1.13 (H) 07/27/2022   BUN 9 07/27/2022   NA 136 07/27/2022   K 3.7 07/27/2022   CL 101 07/27/2022   CO2 27 07/27/2022    Lab Results  Component Value Date   WBC 4.9 07/27/2022   HGB 11.1 (L) 07/27/2022   HCT 32.8 (L) 07/27/2022   MCV 94.0 07/27/2022   PLT 200 07/27/2022   DG Chest 2 View CLINICAL DATA:  Shortness of breath.  Cough with hypoxia.  EXAM: CHEST - 2 VIEW  COMPARISON:  CT 12/22/2021 PET CT 04/18/2021.  FINDINGS: Heart size is normal. Chronic scoliotic curvature convex to the right. Abnormal pulmonary markings affecting both lungs most consistent with chronic scarring. An element of active pneumonia is not excluded by radiography. Consider chest CT if concern persists. No visible effusion.  IMPRESSION: Abnormal pulmonary markings most consistent with chronic scarring. An element of active pneumonia is not excluded by radiography. Consider chest CT if concern persists.  Electronically Signed   By: Nelson Chimes M.D.   On: 08/29/2022 13:27   Assessment & Plan:  She will need to schedule  appt with her PCP for follow up and preop plastic surgery clearance.   Will forward today's note to PCP as well as oncology.   Problem List Items Addressed This Visit     TOBACCO ABUSE    Encouraged ongoing cessation efforts. She is down to 5-10 cig/day, using nicoderm patches.       Malignant neoplasm of upper-outer quadrant of left breast in female, estrogen receptor positive (HCC)   Relevant Medications    amoxicillin-clavulanate (AUGMENTIN) 875-125 MG tablet   azithromycin (ZITHROMAX) 250 MG tablet   CAP (community acquired pneumonia)    CXR suspicious for R sided pneumonia in setting of cough, hypoxia, dyspnea and fever/chill at home. Infiltrates not present on thoracic films from 05/2022.  Rx augmentin + azithromycin antibiotic treatment. Rx cheratussin PRN cough.  Discussed hold UTIppx TMP while on these antibiotics. Comorbidities present include immunocompromised status due to active breast cancer currently on chemotherapy, long smoking history.  Rec f/u with PCP next week for recheck, seek further evaluation if worsening symptoms.       Relevant Medications   amoxicillin-clavulanate (AUGMENTIN) 875-125 MG tablet   azithromycin (ZITHROMAX) 250 MG tablet   guaiFENesin-codeine (CHERATUSSIN AC) 100-10 MG/5ML syrup   Hypoxia - Primary    Dyspnea hypoxia and cough present for the past week.  Flu swab negative today.  Home COVID swab negative as well.  Cancer hx and on meds that increase blood clot risk - check D dimer. If positive, discussed need for CTA chest r/o PE.  See above.       Relevant Orders   Basic metabolic panel   CBC with Differential/Platelet   D-dimer, quantitative   Influenza A/B (Completed)   Other Visit Diagnoses     Epistaxis       Acute cough       Relevant Orders   DG Chest 2 View (Completed)   Influenza A/B (Completed)        Meds ordered this encounter  Medications   amoxicillin-clavulanate (AUGMENTIN) 875-125 MG tablet    Sig: Take 1 tablet by mouth 2 (two) times daily for 10 days.    Dispense:  20 tablet    Refill:  0   azithromycin (ZITHROMAX) 250 MG tablet    Sig: Take 2 tablets by mouth on day 1, then 1 tablet daily on days 2 through 5    Dispense:  6 tablet    Refill:  0   guaiFENesin-codeine (CHERATUSSIN AC) 100-10 MG/5ML syrup    Sig: Take 5 mLs by mouth 2 (two) times daily as needed for cough.    Dispense:  120 mL    Refill:  0    Orders Placed This Encounter  Procedures   DG Chest 2 View    Standing Status:   Future    Number of Occurrences:   1    Standing Expiration Date:   08/30/2023    Order Specific Question:   Reason for Exam (SYMPTOM  OR DIAGNOSIS REQUIRED)    Answer:   cough with hypoxia, chemotherpay    Order Specific Question:   Is patient pregnant?    Answer:   No    Order Specific Question:   Preferred imaging location?    Answer:   [facility]   Basic metabolic panel   CBC with Differential/Platelet   D-dimer, quantitative   Influenza A/B    Patient Instructions  Xray today, labs today.  Start antibiotics sent to pharmacy.  We will await lab  results and may order CT chest for further evaluation.  Schedule follow up appointment with Mable Paris.   Follow up plan: No follow-ups on file.  Ria Bush, MD

## 2022-08-29 NOTE — Telephone Encounter (Signed)
Replied via lab results.  Will order CTA chest.  Recommend ER evaluation for febrile neutropenia if fever >100.4.

## 2022-08-29 NOTE — Telephone Encounter (Signed)
Lvm asking pt to call back.  Need to relay results, Dr. Synthia Innocent message and let her know Dr. Darnell Level sent pt a MyChart message.  Labs/Dr. Synthia Innocent msg: Your D dimer test returned elevated - for this reason I recommend proceeding with CT angiography of chest - we will work towards getting this set up as soon as possible this week. Your kidneys and sugar returned normal. Your white count was very low, you're a bit more anemic. Because white cell count is low, if you have any fever >100.4, I recommend you go to ER for evaluation.

## 2022-08-29 NOTE — Addendum Note (Signed)
Addended by: Ria Bush on: 08/29/2022 05:18 PM   Modules accepted: Orders

## 2022-08-29 NOTE — Telephone Encounter (Signed)
Pt called in because she was at her plastic surgeons office yesterday and her O2 stats would not come up. Her O2 stas were at 92 and the lowest it got was 80.   They told her they wanted her to be seen by her PCP and she would need clearance from her PCP before the 15th.   Pt states she does not want to go to ED or Urgent care at all. So I got pt scheduled with Dr. Ria Bush at [facility] today at 12:30pm

## 2022-08-29 NOTE — Telephone Encounter (Signed)
Opened in error

## 2022-08-29 NOTE — Telephone Encounter (Signed)
NOTED Thanks Cory Munch!

## 2022-08-30 ENCOUNTER — Other Ambulatory Visit: Payer: Self-pay | Admitting: Family

## 2022-08-30 ENCOUNTER — Encounter: Payer: Self-pay | Admitting: Occupational Therapy

## 2022-08-30 ENCOUNTER — Other Ambulatory Visit: Payer: Self-pay | Admitting: Internal Medicine

## 2022-08-30 ENCOUNTER — Other Ambulatory Visit: Payer: Self-pay | Admitting: Oncology

## 2022-08-30 ENCOUNTER — Ambulatory Visit
Admission: RE | Admit: 2022-08-30 | Discharge: 2022-08-30 | Disposition: A | Discharge status: Home / Self Care | Payer: No Typology Code available for payment source | Source: Ambulatory Visit | Attending: Family Medicine | Admitting: Family Medicine

## 2022-08-30 DIAGNOSIS — R079 Chest pain, unspecified: Secondary | ICD-10-CM | POA: Diagnosis present

## 2022-08-30 DIAGNOSIS — I700 Atherosclerosis of aorta: Secondary | ICD-10-CM

## 2022-08-30 MED ORDER — IOHEXOL 350 MG/ML SOLN
75.0000 mL | Freq: Once | INTRAVENOUS | Status: AC | PRN
  Administered 2022-08-30: 75 mL via INTRAVENOUS

## 2022-08-30 MED ORDER — ANASTROZOLE 1 MG PO TABS
1.0000 mg | ORAL_TABLET | Freq: Every day | ORAL | 5 refills | Status: DC
  Filled 2022-08-30: qty 30, 30d supply, fill #0
  Filled 2022-09-28: qty 30, 30d supply, fill #1
  Filled 2022-10-29: qty 30, 30d supply, fill #2
  Filled 2022-11-29 – 2022-11-30 (×2): qty 30, 30d supply, fill #3
  Filled 2022-12-28: qty 30, 30d supply, fill #4
  Filled 2023-02-04: qty 30, 30d supply, fill #5

## 2022-08-30 NOTE — Therapy (Signed)
OUTPATIENT OT UPPER EXTREMITY LYMPHEDEMA EVALUATION  Patient Name: Tricia Berry MRN: [id number] DOB:[birth year], 51 y.o., female Today's Date: 08/30/2022    OT End of Session - 08/29/22 1128     Visit Number 3    Number of Visits 36    Date for OT Re-Evaluation 11/20/22    OT Start Time 0910    OT Stop Time 1015    OT Time Calculation (min) 65 min    Activity Tolerance Patient tolerated treatment well;No increased pain    Behavior During Therapy WFL for tasks assessed/performed              Past Medical History:  Diagnosis Date   Anxiety    Breast cancer ([place])    Diverticulitis    Family history of ovarian cancer    GERD (gastroesophageal reflux disease)    IBS (irritable bowel syndrome)    PONV (postoperative nausea and vomiting)    severe migraine and vomiting post anesthesia   Scoliosis    Past Surgical History:  Procedure Laterality Date   ABDOMINAL HYSTERECTOMY     still has ovaries, no gyn cancer, hysterectomy due to endometriosis. NO cervix on exam 01/10/21   BREAST BIOPSY Left 09/15/2020   [place] bx of mass, path pending, Q marker   BREAST BIOPSY Left 09/15/2020   [place] bx of LN, hydromarker, path pending   BREAST RECONSTRUCTION WITH PLACEMENT OF TISSUE EXPANDER AND FLEX HD (ACELLULAR HYDRATED DERMIS) Bilateral 03/17/2021   Procedure: IMMEDIATE BILATERAL BREAST RECONSTRUCTION WITH PLACEMENT OF TISSUE EXPANDER AND FLEX HD (ACELLULAR HYDRATED DERMIS);  Surgeon: Wallace Going, DO;  Location: [facility];  Service: Plastics;  Laterality: Bilateral;   COLONOSCOPY WITH PROPOFOL N/A 11/21/2021   Procedure: COLONOSCOPY WITH PROPOFOL;  Surgeon: Lin Landsman, MD;  Location: [facility] ENDOSCOPY;  Service: Gastroenterology;  Laterality: N/A;   IR IMAGING GUIDED PORT INSERTION  10/01/2020   MODIFIED MASTECTOMY Left 03/17/2021   Procedure: LEFT MODIFIED RADICAL MASTECTOMY;  Surgeon: Erroll Luna, MD;  Location: [place];  Service:  General;  Laterality: Left;   PORTA CATH REMOVAL Right 03/17/2021   Procedure: PORTA CATH REMOVAL;  Surgeon: Erroll Luna, MD;  Location: [facility];  Service: General;  Laterality: Right;   REMOVAL OF BILATERAL TISSUE EXPANDERS WITH PLACEMENT OF BILATERAL BREAST IMPLANTS Bilateral 05/09/2021   Procedure: REMOVAL OF BILATERAL TISSUE EXPANDERS WITH PLACEMENT OF BILATERAL BREAST IMPLANTS;  Surgeon: Wallace Going, DO;  Location: [place];  Service: Plastics;  Laterality: Bilateral;  90 min   TOTAL MASTECTOMY Right 03/17/2021   Procedure: TOTAL MASTECTOMY;  Surgeon: Erroll Luna, MD;  Location: [facility];  Service: General;  Laterality: Right;   Patient Active Problem List   Diagnosis Date Noted   CAP (community acquired pneumonia) 08/29/2022   Hypoxia 08/29/2022   Breast asymmetry following reconstructive surgery 08/15/2022   Lumbosacral facet arthropathy (Multilevel) (Bilateral) 07/05/2022   Lumbar spinal facet joint arthropathy with concurrent effusion (L3-4) (Right) 07/05/2022   Degenerative lumbar rotatory levoscoliosis 07/05/2022   Lumbosacral foraminal stenosis (Bilateral: L3-4) (Left: L4-5, L5-S1) 07/05/2022   Lumbosacral lateral recess stenosis (Left: L4-5) (Bilateral: L5-S1) 07/05/2022   Lumbar nerve root impingement (Bilateral: L3-4, L5-S1) (Left: L4-5) 07/05/2022   Long term prescription benzodiazepine use 07/05/2022   Chronic use of opiate for therapeutic purpose 07/05/2022   Lumbosacral facet syndrome (Bilateral) 07/05/2022   Pain medication agreement signed (12/07/21) 05/29/2022   History of breast cancer (Left) 05/29/2022  History of mastectomy (Bilateral) 05/29/2022   Chronic calf pain (2ry area of Pain) (Bilateral) 05/29/2022   Chronic lower extremity pain (3ry area of Pain) (Bilateral) 05/29/2022   Chronic feet pain (Bilateral) 05/29/2022   Chronic upper extremity pain (4th area of Pain) (Bilateral) (L>R) 05/29/2022    Chronic hand pain (Bilateral) (L>R) 05/29/2022   Chronic generalized pain (5th area of Pain) 05/29/2022   Abnormal MRI, lumbar spine (10/13/2021) 05/29/2022   Chronic pain syndrome 05/28/2022   Pharmacologic therapy 05/28/2022   Disorder of skeletal system 05/28/2022   Problems influencing health status 05/28/2022   Family history of heart disease 11/16/2021   Acquired absence of breast 08/16/2021   B12 deficiency 07/21/2021   Atherosclerosis of aorta ([place]) 07/20/2021   Hepatic steatosis 07/20/2021   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[place]) 03/14/2021   Genetic testing 11/22/2020   Family history of ovarian cancer    Candidal vulvovaginitis 11/01/2020   Chronic low back pain (1ry area of Pain) (Bilateral) (R>L) w/o sciatica 10/06/2020   Encounter for medical examination to establish care 09/26/2020   Malignant neoplasm of upper-outer quadrant of left breast in female, estrogen receptor positive ([place]) 09/26/2020   BACK STRAIN, LUMBAR 03/08/2010   INTERNAL HEMORRHOIDS 01/27/2010   Anal fissure 01/27/2010   OSTEOARTHRITIS, HANDS, BILATERAL 01/04/2010   Arthralgia 10/05/2009   INSOMNIA, CHRONIC 05/06/2009   ALLERGIC RHINITIS, SEASONAL 02/09/2009   TOBACCO ABUSE 12/29/2008   IBS 10/13/2008   Anxiety state 08/25/2008   DEPRESSION, RECURRENT 08/25/2008    PCP: Burnard Hawthorne, FPN  REFERRING PROVIDER: Verlon Au, NP  REFERRING DIAG: 197.2  THERAPY DIAG:  LUE/ LUQ Post-mastectomy lymphedema syndrome  ONSET DATE: 03/17/21  Rationale for Evaluation and Treatment: Rehabilitation  SUBJECTIVE                                                                                                                                                                                           SUBJECTIVE STATEMENT:Tricia Berry presents for OT to address L breast cancer related lymphedema. Marland Kitchen LE-related pain is unchanged since last session. Pt has no new complaints.   PERTINENT HISTORY: L  Breast Ca ER+, PR+, HER2-, stage III  L breast biopsy 09/15/20; Neoadjuvant chemotherapy; L modified radical mastectomy and R prophylactic total mastectomy with immediate placement of tissue expanders 03/17/21; L ALND 7+/17. XRT completed 02/18/21. Marland Kitchen  Upcoming R capsulectomy scheduled 09/07/22. Chronic pain syndrome, chronic insomnia, OA B hands, Tobacco abuse, IBS, Anxiety, Recurrent Depression   PAIN:  Are you having pain? Yes NPRS scale: not rated numerically Pain location: L lateral trunk, axilla, volar arm and forearm, chest wall Pain orientation L  PAIN TYPE: pulling, discomfort, fullness, tightness Pain description: intermittent Aggravating factors: end range shoulder AROM, lifting, carrying, stretching Relieving factors: MLD, compression  Are you having pain? Yes NPRS scale: 4/10 Pain location: lower back Pain orientation: Medial  PAIN TYPE: aching Pain description: constant  Aggravating factors: weight bearing Relieving factors: medication  Are you having pain? Yes NPRS scale: 4/10 Pain location: hands Pain orientation: bilateral PAIN TYPE: burning, shooting, pins and needles, stabbing Pain description: constant  Aggravating factors: unknown Relieving factors: medication  Are you having pain? Yes NPRS scale: 5/10 Pain location: toes Pain orientation: bilateral PAIN TYPE: burning, shooting, pins and needles, stabbing Pain description: constant  Aggravating factors: unknown Relieving factors: medication  PRECAUTIONS: Falls,  Lymphedema Precautions  HAND DOMINANCE : right   PRIOR LEVEL OF FUNCTION: Independent  PATIENT GOALS: make sure lymphedema. Doesn't get worse; reduce pain and discomfort in my hands and arm and trunk   OBJECTIVE   LYMPHEDEMA ASSESSMENTS:   FOTO FUNCTIONAL OUTCOME SCORE: Intake 59/100%  LYMPHEDEMA LIFE IMPACT SCALE (LLIS) Intake TBA 1st Rx visit. Pt did not complete backside of page  TODAY'S TREATMENT:  Pt edu for compression garment  needs, options, recommendations and ordering through DME vendor. Discussed roles of vendor, therapist and insurance billing.  Completed anatomical measurements for LUE custom compression arm sleeve, compression glove, compression bra  and convoluted foam bra insert pad to limit scar tissue and fibrosis formation and facilitate improved lymphatic circulation during HOS. Pt is small stature, so unable to fit her with off-the-shelf compression garments, which are less costly.   PATIENT EDUCATION:  Education details: Pt edu focused on differences between custom  flat knit and off-the-shelf circular elastic compression garments.  Educated Pt re indications for both and pros and cons of each, and rational for therapist's current recommendations  Educated Pt re estimated costs, measuring and fitting process ,DME vendor's involvement and access to insurance benefits. 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 Ensure pad covers serratus anterior and L lateral trunk as well as chest wall. Pt is of petite stature so custom pas and sleeve may be required to achieve correct fit  ASSESSMENT:  CLINICAL IMPRESSION: COMPRESSION: Completed remaining anatomical measurements for medically necessary compression garments , including: Custom Juzo, ccl 1 (20-30 mmHg) compression arm sleeve, x 2 Custom ccl 1 Juzo glove, x 2 JoviPak lumpectomy  pad Belisse compression bras , x 2 Unable to fit with off the shelf compression garments as Pt is too petite to fit into manufacturer's size range for smallest size 1  Pt tolerated MLD and myofascial release techniques to L axilla , arm, and trunk. Painful axillary chording is visible and palpable after session. I could not feel any release of axillary web vessels with manual therapy.   Pt would like to continues  with manual therapy but is hoping she can return to the clinic she visited in [place] because there is no co pay at that facility. She reports she has a 40$ co pay at [facility] outpatient rehab facility, and this poses a significant hardship for  her. Burnis Medin call her for garment fitting once her pieces have arrived from DME vendor. Pt given vendor contact info so she is able to check status on garments. I'm happy to assist with axillary web syndrome Rx PRN.    OBJECTIVE IMPAIRMENTS: decreased activity tolerance, decreased balance, decreased endurance, decreased knowledge of condition, decreased knowledge of use of DME, impaired LUE shoulder AROM, decreased strength, impaired flexibility, impaired sensation, impaired UE functional use, postural dysfunction, impaired dynamic balance, muscle weakness, pain, and increased lymphedema progression risk.   ACTIVITY LIMITATIONS: carrying, lifting, sleeping, bed mobility, reach over head, hygiene/grooming, caring for others, working as Marine scientist, driving, meal prep, cooking  PARTICIPATION LIMITATIONS: cleaning, laundry, driving, shopping, occupation, and yard work  PERSONAL FACTORS: 3+ comorbidities: chronic pain, lumbar stenosis, chemo-induced neuropathy in all 4 limbs  are also affecting patient's functional outcome.   REHAB POTENTIAL: Good for limiting LE progression   GOALS: Goals reviewed with patient? Yes  SHORT TERM GOALS: Target date: 4th OT Rx visit    Pt will demonstrate understanding of lymphedema precautions and prevention strategies  with modified independence using a printed reference to identify at least 5 precautions and discussing how s/he may implement them into daily life to reduce risk of progression and to limit infection risk. Baseline:Max A Goal status: INITIAL   LONG TERM GOALS: Target date: 11/22/2022    Given this patient's Intake score of 59/100% on the functional outcomes FOTO tool, patient will experience an increase in function of 3 points  to improve basic and instrumental ADLs performance, including lymphedema self-care. Baseline: 59/100% Goal status: INITIAL  2.  Given this patient's Intake score of TBA/100% on the Lymphedema Life Impact Scale (LLIS), patient will experience an increase of 5 points in her perceived level of functional impairment resulting from lymphedema to improve functional performance and quality of life (QOL). Baseline: TBA Goal status: INITIAL  3.  With assistive devices and modified independence (extra time) Pt will be able to don and doff appropriate compression garments and/or devices to control BLE lymphedema and to limit progression.  Baseline: Max A Goal status: INITIAL  4.  Pt will be able to correctly perform all lymphedema self-care home program components using correct techniques with extra time and assistive devices PRN (modified independence), including simple self MLD, lymphatic pumping exercise, don and doff appropriate compression garments/ devices, and perform daily skin care regime to limit progression. Baseline: Max A Goal status: INITIAL  PLAN:  PT FREQUENCY: 1-2x/week  PT DURATION: 12 weeks  PLANNED INTERVENTIONS: Therapeutic exercises, Therapeutic activity, Patient/Family education, Self Care, DME instructions, Manual lymph drainage, Taping, Manual therapy, and compression garment measurement , fitting and training  PLAN FOR NEXT SESSION:  Complete compression glove measurements Further assess AWS Have Pt complete LLIS Complete initial volumetrics  using percentages and limb volume differential vs circumferences.  Andrey Spearman, MS, OTR/L, CLT-LANA 08/30/22 11:50 AM

## 2022-08-30 NOTE — Telephone Encounter (Signed)
Pt rtn call.  We spoke about Dr. Synthia Innocent MyChart message and that she will get a call to get CT scheduled.  Pt verbalized understanding.

## 2022-08-30 NOTE — Telephone Encounter (Signed)
Pt viewed Dr. Synthia Innocent MyChart message on 08/29/22 at 8:38 PM.

## 2022-08-30 NOTE — Telephone Encounter (Signed)
Left another message on voicemail for patient to call the office back. 

## 2022-08-30 NOTE — Telephone Encounter (Signed)
Call pt Sch f/u with me for pneumonia

## 2022-08-31 ENCOUNTER — Other Ambulatory Visit: Payer: Self-pay

## 2022-08-31 ENCOUNTER — Ambulatory Visit: Payer: No Typology Code available for payment source | Admitting: Occupational Therapy

## 2022-08-31 ENCOUNTER — Telehealth: Payer: Self-pay | Admitting: Student

## 2022-08-31 MED ORDER — ROSUVASTATIN CALCIUM 5 MG PO TABS
5.0000 mg | ORAL_TABLET | Freq: Every evening | ORAL | 3 refills | Status: DC
  Filled 2022-08-31: qty 90, 90d supply, fill #0

## 2022-08-31 MED ORDER — BACLOFEN 10 MG PO TABS
10.0000 mg | ORAL_TABLET | Freq: Three times a day (TID) | ORAL | 1 refills | Status: DC | PRN
  Filled 2022-08-31: qty 15, 5d supply, fill #0
  Filled 2022-09-26: qty 15, 5d supply, fill #1

## 2022-08-31 NOTE — Telephone Encounter (Signed)
Spoke to pt and scheduled f/up for pneumonia for 10/17 @ 10:15am

## 2022-08-31 NOTE — Telephone Encounter (Signed)
I called the patient to inform her that her surgery has been canceled due to her pneumonia.  I discussed with the patient that we will have to wait until her pneumonia is completely resolved before moving forward with surgery.  Patient expressed understanding.  I discussed with the patient that I will speak with our surgery scheduler to see when we can get her back on the schedule.

## 2022-09-01 ENCOUNTER — Telehealth: Payer: Self-pay

## 2022-09-01 NOTE — Progress Notes (Signed)
Subjective:    Patient ID: Tricia Berry, female    DOB: 03/04/1971, 51 y.o.   MRN: [id number]  CC: Tricia Berry is a 51 y.o. female who presents today for follow up.   HPI: PNA follow up  SOB and dry cough have improved   Sa02 at home 89-95% with exertion.   Endorses sob with exertion.   Completed augumentin.   Continues to cough, episodic, worse when outside and from allergies ( from leaves). Coughs when lays down at night.   Requests refill of cheratussin which has been helpful.  She has not been overly sedated on this  No fever, wheezing, left arm numbness, chills, ear pain   She is smoker  Scheduled for breast surgery , right breast capsule release, 10/18/22, moved from 09/06/22 due to PNA. General anesthesia. Dr Marla Roe  Seen by Dr Danise Mina 08/29/22 for hypoxia, diagnosed with PNA and started on augmentin, azithromycin,prn cheratussin.   D Dimer positive, CTA obtained without evidence of pulmonary embolism, multiple patchy airspace opacities are noted bilaterally consistent with multifocal pneumonia. Negative influenza CXR 08/29/22 showed chronic scarring. Smoker WBC 2.9  Hemoglobin 10.5      HISTORY:  Past Medical History:  Diagnosis Date   Anxiety    Breast cancer ([place])    Diverticulitis    Family history of ovarian cancer    GERD (gastroesophageal reflux disease)    IBS (irritable bowel syndrome)    PONV (postoperative nausea and vomiting)    severe migraine and vomiting post anesthesia   Scoliosis    Past Surgical History:  Procedure Laterality Date   ABDOMINAL HYSTERECTOMY     still has ovaries, no gyn cancer, hysterectomy due to endometriosis. NO cervix on exam 01/10/21   BREAST BIOPSY Left 09/15/2020   [place] bx of mass, path pending, Q marker   BREAST BIOPSY Left 09/15/2020   [place] bx of LN, hydromarker, path pending   BREAST RECONSTRUCTION WITH PLACEMENT OF TISSUE EXPANDER AND FLEX HD (ACELLULAR HYDRATED DERMIS) Bilateral 03/17/2021    Procedure: IMMEDIATE BILATERAL BREAST RECONSTRUCTION WITH PLACEMENT OF TISSUE EXPANDER AND FLEX HD (ACELLULAR HYDRATED DERMIS);  Surgeon: Wallace Going, DO;  Location: [place];  Service: Plastics;  Laterality: Bilateral;   COLONOSCOPY WITH PROPOFOL N/A 11/21/2021   Procedure: COLONOSCOPY WITH PROPOFOL;  Surgeon: Lin Landsman, MD;  Location: [facility] ENDOSCOPY;  Service: Gastroenterology;  Laterality: N/A;   IR IMAGING GUIDED PORT INSERTION  10/01/2020   MODIFIED MASTECTOMY Left 03/17/2021   Procedure: LEFT MODIFIED RADICAL MASTECTOMY;  Surgeon: Erroll Luna, MD;  Location: [place];  Service: General;  Laterality: Left;   PORTA CATH REMOVAL Right 03/17/2021   Procedure: PORTA CATH REMOVAL;  Surgeon: Erroll Luna, MD;  Location: [facility];  Service: General;  Laterality: Right;   REMOVAL OF BILATERAL TISSUE EXPANDERS WITH PLACEMENT OF BILATERAL BREAST IMPLANTS Bilateral 05/09/2021   Procedure: REMOVAL OF BILATERAL TISSUE EXPANDERS WITH PLACEMENT OF BILATERAL BREAST IMPLANTS;  Surgeon: Wallace Going, DO;  Location: [place];  Service: Plastics;  Laterality: Bilateral;  90 min   TOTAL MASTECTOMY Right 03/17/2021   Procedure: TOTAL MASTECTOMY;  Surgeon: Erroll Luna, MD;  Location: [place];  Service: General;  Laterality: Right;   Family History  Problem Relation Age of Onset   Hypertension Mother    Osteoarthritis Mother    Diverticulitis Mother    Heart failure Father    Hypertension Father    Gout Father  Heart attack Father 33   Diverticulitis Brother    Ovarian cancer Paternal Grandmother     Allergies: Morphine, Sertraline hcl, Sulfa antibiotics, Sulfamethoxazole, and Sulfonamide derivatives Current Outpatient Medications on File Prior to Visit  Medication Sig Dispense Refill   abemaciclib (VERZENIO) 100 MG tablet Take 1 tablet (100 mg total) by mouth 2 (two) times daily. 56  tablet 1   amitriptyline (ELAVIL) 50 MG tablet Take 1 tablet (50 mg total) by mouth at bedtime. 60 tablet 1   anastrozole (ARIMIDEX) 1 MG tablet Take 1 tablet (1 mg total) by mouth daily. 30 tablet 5   b complex vitamins capsule Take 1 capsule by mouth daily.     baclofen (LIORESAL) 10 MG tablet Take 1 tablet (10 mg total) by mouth 3 (three) times daily as needed for muscle spasms. 15 tablet 1   CALCIUM-VITAMIN D PO Take 1 tablet by mouth daily at 12 noon.     cyanocobalamin (VITAMIN B12) 1000 MCG/ML injection 1000 mcg (1 mL) intramuscular injection in the thigh ( vastus lateralis) once per month. 3 mL 4   FLUoxetine (PROZAC) 20 MG capsule Take 1 capsule (20 mg total) by mouth every morning. 90 capsule 3   gentamicin ointment (GARAMYCIN) 0.1 % Apply to nose three times daily for 2 weeks then as needed 30 g 12   Lactobacillus (PROBIOTIC ACIDOPHILUS PO) Take 1 capsule by mouth daily.     LORazepam (ATIVAN) 0.5 MG tablet Take 1 tablet (0.5 mg total) by mouth at bedtime. 30 tablet 2   Multiple Vitamins-Minerals (MULTIVITAL PO) Take 1 Dose by mouth daily.     naloxone (NARCAN) nasal spray 4 mg/0.1 mL Place 1 spray into the nose as needed for opioid-induced respiratory depresssion. In case of emergency (overdose), spray once into each nostril. If no response within 3 minutes, repeat application and call 528. 2 each 0   nicotine (NICODERM CQ - DOSED IN MG/24 HOURS) 21 mg/24hr patch use 1 patch daily 28 patch 0   olopatadine (PATADAY) 0.1 % ophthalmic solution 1 drop 2 (two) times daily.     ondansetron (ZOFRAN-ODT) 8 MG disintegrating tablet Take 1 tablet (8 mg total) by mouth every 8 (eight) hours as needed for nausea or vomiting. 45 tablet 2   oxyCODONE (OXY IR/ROXICODONE) 5 MG immediate release tablet Take 1 tablet (5 mg total) by mouth 2 (two) times daily. Must last 30 days. 60 tablet 0   pregabalin (LYRICA) 150 MG capsule Take 1 capsule (150 mg total) by mouth in the morning, at noon, and at bedtime.  90 capsule 2   rosuvastatin (CRESTOR) 5 MG tablet Take 1 tablet (5 mg total) by mouth every evening. 90 tablet 3   trimethoprim (TRIMPEX) 100 MG tablet Take 1 tablet (100 mg total) by mouth daily. 90 tablet 3   VITAMIN D PO Take 2,000 Int'l Units/day by mouth daily at 12 noon.     Current Facility-Administered Medications on File Prior to Visit  Medication Dose Route Frequency Provider Last Rate Last Admin   acetaminophen (TYLENOL) 325 MG tablet            diphenhydrAMINE (BENADRYL) 25 mg capsule            goserelin (ZOLADEX) injection 3.6 mg  3.6 mg Subcutaneous Q28 days Sindy Guadeloupe, MD   3.6 mg at 05/04/21 1150   heparin lock flush 100 unit/mL  500 Units Intravenous Once Sindy Guadeloupe, MD       Zoledronic Acid (ZOMETA)  4 MG/100ML IVPB             Social History   Tobacco Use   Smoking status: Some Days    Packs/day: 0.25    Types: Cigarettes   Smokeless tobacco: Never   Tobacco comments:    Pt trying to quit now. Has reduced amount significantly  Vaping Use   Vaping Use: Never used  Substance Use Topics   Alcohol use: Not Currently   Drug use: No    Review of Systems  Constitutional:  Negative for chills and fever.  HENT:  Negative for congestion.   Respiratory:  Positive for cough, chest tightness and shortness of breath.   Cardiovascular:  Negative for chest pain, palpitations and leg swelling.  Gastrointestinal:  Negative for nausea and vomiting.      Objective:    BP 116/80 (BP Location: Left Arm, Patient Position: Sitting, Cuff Size: Normal)   Pulse 78   Temp 98.1 F (36.7 C) (Oral)   Wt 115 lb (52.2 kg)   SpO2 98%   BMI 22.46 kg/m  BP Readings from Last 3 Encounters:  09/08/22 116/80  08/29/22 120/68  08/28/22 100/64   Wt Readings from Last 3 Encounters:  09/08/22 115 lb (52.2 kg)  08/29/22 117 lb 2 oz (53.1 kg)  08/28/22 118 lb 3.2 oz (53.6 kg)    Physical Exam Vitals reviewed.  Constitutional:      Appearance: She is well-developed.  Eyes:      Conjunctiva/sclera: Conjunctivae normal.  Cardiovascular:     Rate and Rhythm: Normal rate and regular rhythm.     Pulses: Normal pulses.     Heart sounds: Normal heart sounds.  Pulmonary:     Effort: Pulmonary effort is normal.     Breath sounds: Normal breath sounds. No wheezing, rhonchi or rales.  Skin:    General: Skin is warm and dry.  Neurological:     Mental Status: She is alert.  Psychiatric:        Speech: Speech normal.        Behavior: Behavior normal.        Thought Content: Thought content normal.        Assessment & Plan:   Problem List Items Addressed This Visit       Respiratory   CAP (community acquired pneumonia) - Primary    Afebrile and nontoxic in appearance.  Patient felt much better with complete resolution of chest tightness after nebulizer, DuoNeb, given in the office today.  No adventitious lung sounds.   PNA complicated as patient is immunocompromised ( WBC 2.9) and smoker. I provided a sample Breztri 160-9, refilled Cherritussin.  Counseled on sedation with Cheratussin and to only use at bedtime as needed. Pending CXR EKG shows SR with without acute changes when compared to previous EKG as obtained by cardiology, Dr. Charlestine Night 12/15/21, as well1/25/2023.  No new T wave inversions. However in setting of upcoming breast surgery, I have sent a staff message to Dt Etang to ensure patient optimized. I also strictly advised that if SOB and cough persist she needs to make Dr Marla Roe ( surgery) and Dr Lowella Dandy ( pain management) aware prior to any anesthesia due to risk of respiratory compromise as procedure may need to be delayed.  She verbalized understanding.   I looked up patient on [place] Controlled Substances Reporting System PMP AWARE and saw no activity that raised concern of inappropriate use.        Relevant Medications   albuterol (  VENTOLIN HFA) 108 (90 Base) MCG/ACT inhaler   Other Relevant Orders   DG Chest 2 View   Protime-INR   CBC with  Differential/Platelet   Comprehensive metabolic panel   EKG 68-TLXB (Completed)     I have discontinued Jaycee G. Donofrio "[name]"'s tiZANidine, cephALEXin, amoxicillin-clavulanate, azithromycin, and Cheratussin AC. I am also having her start on albuterol. Additionally, I am having her maintain her Multiple Vitamins-Minerals (MULTIVITAL PO), Lactobacillus (PROBIOTIC ACIDOPHILUS PO), cyanocobalamin, trimethoprim, FLUoxetine, VITAMIN D PO, CALCIUM-VITAMIN D PO, ondansetron, abemaciclib, pregabalin, gentamicin ointment, LORazepam, b complex vitamins, olopatadine, amitriptyline, naloxone, oxyCODONE, nicotine, anastrozole, rosuvastatin, and baclofen.   Meds ordered this encounter  Medications   DISCONTD: guaiFENesin-codeine (CHERATUSSIN AC) 100-10 MG/5ML syrup    Sig: Take 5 mLs by mouth at bedtime as needed for cough.    Dispense:  70 mL    Refill:  0    Order Specific Question:   Supervising Provider    Answer:   Deborra Medina L [[id number]]   albuterol (VENTOLIN HFA) 108 (90 Base) MCG/ACT inhaler    Sig: Inhale 2 puffs into the lungs every 6 (six) hours as needed for wheezing or shortness of breath.    Dispense:  6.7 g    Refill:  0    Order Specific Question:   Supervising Provider    Answer:   Crecencio Mc [[id number]]    Return precautions given.   Risks, benefits, and alternatives of the medications and treatment plan prescribed today were discussed, and patient expressed understanding.   Education regarding symptom management and diagnosis given to patient on AVS.  Continue to follow with Burnard Hawthorne, FNP for routine health maintenance.   Alden Benjamin Flessner and I agreed with plan.   Mable Paris, FNP  I have spent 28 minutes with a patient including precharting, exam, reviewing medical records, and discussion plan of care.

## 2022-09-01 NOTE — Telephone Encounter (Signed)
LVM to call back to see if pt is available to come in on Mon @ 4pm  to see if we  can get her surgical clearance. instead of waiting until the17th.  I already added her to schedule for Monday just in case per [facility].

## 2022-09-04 ENCOUNTER — Ambulatory Visit: Payer: No Typology Code available for payment source | Admitting: Family

## 2022-09-04 ENCOUNTER — Encounter: Payer: Self-pay | Admitting: Family

## 2022-09-04 NOTE — Progress Notes (Deleted)
Subjective:    Patient ID: Tricia Berry, female    DOB: 03-Jan-1971, 51 y.o.   MRN: [id number]  CC: Tricia Berry is a 51 y.o. female who presents today for follow up.   HPI: HPI Scheduled for breast surgery , right breast capsule release, 10/18/22, moved from 09/06/22 due to PNA. General anesthesia.  Seen by Dr Danise Mina 117/23 for hypoxia, diagnosed with PNA and started on augmentin, azithromycin,prn cheratussin.  D Dimer positive, CTA obtained without evidence of pulmonary embolism, multiple patchy airspace opacities are noted bilaterally consistent with multifocal pneumonia. Negative influenza CXR 08/29/22 showed chronic scarring. Smoker WBC 2.9  Hemoglobin 10.5  Chemotherapy-induced neuropathy-compliant with Lyrica 50 mg 3 times daily, B12  Anxiety-compliant with Prozac 40 mg, trazodone 25 mg, Ativan 0.5 mg as needed  HISTORY:  Past Medical History:  Diagnosis Date   Anxiety    Breast cancer ([place])    Diverticulitis    Family history of ovarian cancer    GERD (gastroesophageal reflux disease)    IBS (irritable bowel syndrome)    PONV (postoperative nausea and vomiting)    severe migraine and vomiting post anesthesia   Scoliosis    Past Surgical History:  Procedure Laterality Date   ABDOMINAL HYSTERECTOMY     still has ovaries, no gyn cancer, hysterectomy due to endometriosis. NO cervix on exam 01/10/21   BREAST BIOPSY Left 09/15/2020   [place] bx of mass, path pending, Q marker   BREAST BIOPSY Left 09/15/2020   [place] bx of LN, hydromarker, path pending   BREAST RECONSTRUCTION WITH PLACEMENT OF TISSUE EXPANDER AND FLEX HD (ACELLULAR HYDRATED DERMIS) Bilateral 03/17/2021   Procedure: IMMEDIATE BILATERAL BREAST RECONSTRUCTION WITH PLACEMENT OF TISSUE EXPANDER AND FLEX HD (ACELLULAR HYDRATED DERMIS);  Surgeon: Wallace Going, DO;  Location: [place];  Service: Plastics;  Laterality: Bilateral;   COLONOSCOPY WITH PROPOFOL N/A 11/21/2021   Procedure:  COLONOSCOPY WITH PROPOFOL;  Surgeon: Lin Landsman, MD;  Location: [facility] ENDOSCOPY;  Service: Gastroenterology;  Laterality: N/A;   IR IMAGING GUIDED PORT INSERTION  10/01/2020   MODIFIED MASTECTOMY Left 03/17/2021   Procedure: LEFT MODIFIED RADICAL MASTECTOMY;  Surgeon: Erroll Luna, MD;  Location: [place];  Service: General;  Laterality: Left;   PORTA CATH REMOVAL Right 03/17/2021   Procedure: PORTA CATH REMOVAL;  Surgeon: Erroll Luna, MD;  Location: [place];  Service: General;  Laterality: Right;   REMOVAL OF BILATERAL TISSUE EXPANDERS WITH PLACEMENT OF BILATERAL BREAST IMPLANTS Bilateral 05/09/2021   Procedure: REMOVAL OF BILATERAL TISSUE EXPANDERS WITH PLACEMENT OF BILATERAL BREAST IMPLANTS;  Surgeon: Wallace Going, DO;  Location: [place];  Service: Plastics;  Laterality: Bilateral;  90 min   TOTAL MASTECTOMY Right 03/17/2021   Procedure: TOTAL MASTECTOMY;  Surgeon: Erroll Luna, MD;  Location: [place];  Service: General;  Laterality: Right;   Family History  Problem Relation Age of Onset   Hypertension Mother    Osteoarthritis Mother    Diverticulitis Mother    Heart failure Father    Hypertension Father    Gout Father    Heart attack Father 7   Diverticulitis Brother    Ovarian cancer Paternal Grandmother     Allergies: Morphine, Sertraline hcl, Sulfa antibiotics, Sulfamethoxazole, and Sulfonamide derivatives Current Outpatient Medications on File Prior to Visit  Medication Sig Dispense Refill   abemaciclib (VERZENIO) 100 MG tablet Take 1 tablet (100 mg total) by mouth 2 (two) times daily. [place]  tablet 1   amitriptyline (ELAVIL) 50 MG tablet Take 1 tablet (50 mg total) by mouth at bedtime. 60 tablet 1   amoxicillin-clavulanate (AUGMENTIN) 875-125 MG tablet Take 1 tablet by mouth 2 (two) times daily for 10 days. 20 tablet 0   anastrozole (ARIMIDEX) 1 MG tablet Take 1 tablet (1 mg total) by  mouth daily. 30 tablet 5   b complex vitamins capsule Take 1 capsule by mouth daily.     baclofen (LIORESAL) 10 MG tablet Take 1 tablet (10 mg total) by mouth 3 (three) times daily as needed for muscle spasms. 15 tablet 1   CALCIUM-VITAMIN D PO Take 1 tablet by mouth daily at 12 noon.     cyanocobalamin (VITAMIN B12) 1000 MCG/ML injection 1000 mcg (1 mL) intramuscular injection in the thigh ( vastus lateralis) once per month. 3 mL 4   FLUoxetine (PROZAC) 20 MG capsule Take 1 capsule (20 mg total) by mouth every morning. 90 capsule 3   gentamicin ointment (GARAMYCIN) 0.1 % Apply to nose three times daily for 2 weeks then as needed 30 g 12   guaiFENesin-codeine (CHERATUSSIN AC) 100-10 MG/5ML syrup Take 5 mLs by mouth 2 (two) times daily as needed for cough. 120 mL 0   Lactobacillus (PROBIOTIC ACIDOPHILUS PO) Take 1 capsule by mouth daily.     LORazepam (ATIVAN) 0.5 MG tablet Take 1 tablet (0.5 mg total) by mouth at bedtime. 30 tablet 2   Multiple Vitamins-Minerals (MULTIVITAL PO) Take 1 Dose by mouth daily.     naloxone (NARCAN) nasal spray 4 mg/0.1 mL Place 1 spray into the nose as needed for opioid-induced respiratory depresssion. In case of emergency (overdose), spray once into each nostril. If no response within 3 minutes, repeat application and call 174. 2 each 0   nicotine (NICODERM CQ - DOSED IN MG/24 HOURS) 21 mg/24hr patch use 1 patch daily 28 patch 0   olopatadine (PATADAY) 0.1 % ophthalmic solution 1 drop 2 (two) times daily.     ondansetron (ZOFRAN-ODT) 8 MG disintegrating tablet Take 1 tablet (8 mg total) by mouth every 8 (eight) hours as needed for nausea or vomiting. 45 tablet 2   oxyCODONE (OXY IR/ROXICODONE) 5 MG immediate release tablet Take 1 tablet (5 mg total) by mouth 2 (two) times daily. Must last 30 days. 60 tablet 0   pregabalin (LYRICA) 150 MG capsule Take 1 capsule (150 mg total) by mouth in the morning, at noon, and at bedtime. 90 capsule 2   rosuvastatin (CRESTOR) 5 MG  tablet Take 1 tablet (5 mg total) by mouth every evening. 90 tablet 3   tiZANidine (ZANAFLEX) 4 MG tablet Take 1 tablet (4 mg total) by mouth 3 (three) times daily. 30 tablet 0   trimethoprim (TRIMPEX) 100 MG tablet Take 1 tablet (100 mg total) by mouth daily. 90 tablet 3   VITAMIN D PO Take 2,000 Int'l Units/day by mouth daily at 12 noon.     Current Facility-Administered Medications on File Prior to Visit  Medication Dose Route Frequency Provider Last Rate Last Admin   acetaminophen (TYLENOL) 325 MG tablet            diphenhydrAMINE (BENADRYL) 25 mg capsule            goserelin (ZOLADEX) injection 3.6 mg  3.6 mg Subcutaneous Q28 days Sindy Guadeloupe, MD   3.6 mg at 05/04/21 1150   heparin lock flush 100 unit/mL  500 Units Intravenous Once Sindy Guadeloupe, MD  Zoledronic Acid (ZOMETA) 4 MG/100ML IVPB             Social History   Tobacco Use   Smoking status: Some Days    Packs/day: 0.25    Types: Cigarettes   Smokeless tobacco: Never   Tobacco comments:    Pt trying to quit now. Has reduced amount significantly  Vaping Use   Vaping Use: Never used  Substance Use Topics   Alcohol use: Not Currently   Drug use: No    Review of Systems    Objective:    There were no vitals taken for this visit. BP Readings from Last 3 Encounters:  08/29/22 120/68  08/28/22 100/64  08/14/22 135/86   Wt Readings from Last 3 Encounters:  08/29/22 117 lb 2 oz (53.1 kg)  08/28/22 118 lb 3.2 oz (53.6 kg)  08/14/22 117 lb (53.1 kg)    Physical Exam     Assessment & Plan:   Problem List Items Addressed This Visit   None    I am having Tricia G. Abplanalp "[name]" maintain her Multiple Vitamins-Minerals (MULTIVITAL PO), Lactobacillus (PROBIOTIC ACIDOPHILUS PO), cyanocobalamin, tiZANidine, trimethoprim, FLUoxetine, VITAMIN D PO, CALCIUM-VITAMIN D PO, ondansetron, abemaciclib, pregabalin, gentamicin ointment, LORazepam, b complex vitamins, olopatadine, amitriptyline, naloxone,  oxyCODONE, nicotine, amoxicillin-clavulanate, Cheratussin AC, anastrozole, rosuvastatin, and baclofen.   No orders of the defined types were placed in this encounter.   Return precautions given.   Risks, benefits, and alternatives of the medications and treatment plan prescribed today were discussed, and patient expressed understanding.   Education regarding symptom management and diagnosis given to patient on AVS.  Continue to follow with Burnard Hawthorne, FNP for routine health maintenance.   Alden Benjamin Dupras and I agreed with plan.   Mable Paris, FNP

## 2022-09-05 ENCOUNTER — Ambulatory Visit: Payer: No Typology Code available for payment source | Admitting: Occupational Therapy

## 2022-09-06 ENCOUNTER — Encounter (HOSPITAL_BASED_OUTPATIENT_CLINIC_OR_DEPARTMENT_OTHER): Admission: RE | Payer: Self-pay | Source: Home / Self Care

## 2022-09-06 ENCOUNTER — Ambulatory Visit (HOSPITAL_BASED_OUTPATIENT_CLINIC_OR_DEPARTMENT_OTHER)
Admission: RE | Admit: 2022-09-06 | Payer: No Typology Code available for payment source | Source: Home / Self Care | Admitting: Plastic Surgery

## 2022-09-06 SURGERY — CAPSULECTOMY, BREAST
Anesthesia: General | Site: Breast | Laterality: Right

## 2022-09-07 ENCOUNTER — Encounter: Payer: No Typology Code available for payment source | Admitting: Occupational Therapy

## 2022-09-08 ENCOUNTER — Telehealth: Payer: Self-pay | Admitting: Family

## 2022-09-08 ENCOUNTER — Other Ambulatory Visit: Payer: Self-pay

## 2022-09-08 ENCOUNTER — Encounter: Payer: Self-pay | Admitting: Family

## 2022-09-08 ENCOUNTER — Ambulatory Visit (INDEPENDENT_AMBULATORY_CARE_PROVIDER_SITE_OTHER): Payer: No Typology Code available for payment source | Admitting: Family

## 2022-09-08 ENCOUNTER — Ambulatory Visit (INDEPENDENT_AMBULATORY_CARE_PROVIDER_SITE_OTHER): Payer: No Typology Code available for payment source

## 2022-09-08 VITALS — BP 116/80 | HR 78 | Temp 98.1°F | Wt 115.0 lb

## 2022-09-08 DIAGNOSIS — J189 Pneumonia, unspecified organism: Secondary | ICD-10-CM

## 2022-09-08 LAB — COMPREHENSIVE METABOLIC PANEL
ALT: 14 U/L (ref 0–35)
AST: 23 U/L (ref 0–37)
Albumin: 4 g/dL (ref 3.5–5.2)
Alkaline Phosphatase: 75 U/L (ref 39–117)
BUN: 9 mg/dL (ref 6–23)
CO2: 28 mEq/L (ref 19–32)
Calcium: 9.3 mg/dL (ref 8.4–10.5)
Chloride: 102 mEq/L (ref 96–112)
Creatinine, Ser: 0.76 mg/dL (ref 0.40–1.20)
GFR: 90.14 mL/min (ref >60.00)
Glucose, Bld: 82 mg/dL (ref 70–99)
Potassium: 3.8 mEq/L (ref 3.5–5.1)
Sodium: 138 mEq/L (ref 135–145)
Total Bilirubin: 0.3 mg/dL (ref 0.2–1.2)
Total Protein: 7.4 g/dL (ref 6.0–8.3)

## 2022-09-08 LAB — CBC WITH DIFFERENTIAL/PLATELET
Basophils Absolute: 0 10*3/uL (ref 0.0–0.1)
Basophils Relative: 0.5 % (ref 0.0–3.0)
Eosinophils Absolute: 0.1 10*3/uL (ref 0.0–0.7)
Eosinophils Relative: 1.1 % (ref 0.0–5.0)
HCT: 37.5 % (ref 36.0–46.0)
Hemoglobin: 12.3 g/dL (ref 12.0–15.0)
Lymphocytes Relative: 51.3 % — ABNORMAL HIGH (ref 12.0–46.0)
Lymphs Abs: 2.9 10*3/uL (ref 0.7–4.0)
MCHC: 32.9 g/dL (ref 30.0–36.0)
MCV: 96.3 fl (ref 78.0–100.0)
Monocytes Absolute: 0.5 10*3/uL (ref 0.1–1.0)
Monocytes Relative: 9 % (ref 3.0–12.0)
Neutro Abs: 2.2 10*3/uL (ref 1.4–7.7)
Neutrophils Relative %: 38.1 % — ABNORMAL LOW (ref 43.0–77.0)
Platelets: 194 10*3/uL (ref 150.0–400.0)
RBC: 3.89 Mil/uL (ref 3.87–5.11)
RDW: 15.8 % — ABNORMAL HIGH (ref 11.5–15.5)
WBC: 5.7 10*3/uL (ref 4.0–10.5)

## 2022-09-08 LAB — PROTIME-INR
INR: 1.1 ratio — ABNORMAL HIGH (ref 0.8–1.0)
Prothrombin Time: 11.8 s (ref 9.6–13.1)

## 2022-09-08 MED ORDER — CHERATUSSIN AC 100-10 MG/5ML PO SOLN
5.0000 mL | Freq: Every evening | ORAL | 0 refills | Status: DC | PRN
  Filled 2022-09-08: qty 70, 14d supply, fill #0

## 2022-09-08 MED ORDER — ALBUTEROL SULFATE HFA 108 (90 BASE) MCG/ACT IN AERS
2.0000 | INHALATION_SPRAY | Freq: Four times a day (QID) | RESPIRATORY_TRACT | 0 refills | Status: DC | PRN
  Filled 2022-09-08: qty 6.7, 25d supply, fill #0

## 2022-09-08 NOTE — Patient Instructions (Addendum)
I have refilled cough syrup, Cheratussin.  Please be very careful this medication can cause sedation  Please take cough medication at night only as needed. As we discussed, I do not recommend dosing throughout the day as coughing is a protective mechanism . It also helps to break up thick mucous.  Do not take cough suppressants with alcohol as can lead to trouble breathing. Advise caution if taking cough suppressant and operating machinery ( i.e driving a car) as you may feel very tired.   Use albuterol every 6 hours for first 24 hours to get good medication into the lungs and loosen congestion; after, you may use as needed and eventually stop all together when cough resolves.  Trial of breztri  I will consult with Dr. Charlestine Night, cardiology, as it relates to optimizing you for surgery next month prior to signing paperwork.   Please let me know how you are doing.

## 2022-09-08 NOTE — Telephone Encounter (Signed)
-----   Message from Kate Sable, MD sent at 09/08/2022  1:06 PM EST ----- No need to see her. Echo and coronary ct performed earlier this year was unremarkable. Hopefully she stops smoking if she still smokes.  Ty BA ----- Message ----- From: Burnard Hawthorne, FNP Sent: 09/08/2022   1:04 PM EST To: Kate Sable, MD  Dr Charlestine Night,   [facility] that you are doing well.  I just wanted to run this past you as patient has upcoming breast surgery with Dr. Marla Roe next month.  It was postponed from this month due to pneumonia.    I saw her today and overall she is looking much better.  I did do a baseline EKG due to chest tightness and for surgery.  Unchanged from prior.    I just wanted ensure you did not feel the need to see her head of her surgery in late December.  Hope you have a wonderful Thanksgiving.   [name]

## 2022-09-08 NOTE — Telephone Encounter (Signed)
Patient was seen today by Arnett. Patient said that her cough medication was not called into the pharmacy. Also patient would like to know how often she should use the sample inhaler that was given to her at the office.

## 2022-09-08 NOTE — Telephone Encounter (Signed)
Spoke to pt to inform her that she does not have to see Dr Charlestine Night before her surgery

## 2022-09-08 NOTE — Telephone Encounter (Signed)
Call pt  I received a nice note from Dr Charlestine Night  He does not feel that she needs to see him prior to her surgery next month.  I will fax over medical release

## 2022-09-08 NOTE — Assessment & Plan Note (Addendum)
Afebrile and nontoxic in appearance.  Patient felt much better with complete resolution of chest tightness after nebulizer, DuoNeb, given in the office today.  No adventitious lung sounds.   PNA complicated as patient is immunocompromised ( WBC 2.9) and smoker. I provided a sample Breztri 160-9, refilled Cherritussin.  Counseled on sedation with Cheratussin and to only use at bedtime as needed. Pending CXR EKG shows SR with without acute changes when compared to previous EKG as obtained by cardiology, Dr. Charlestine Night 12/15/21, as well1/25/2023.  No new T wave inversions. However in setting of upcoming breast surgery, I have sent a staff message to Dt Etang to ensure patient optimized. I also strictly advised that if SOB and cough persist she needs to make Dr Marla Roe ( surgery) and Dr Lowella Dandy ( pain management) aware prior to any anesthesia due to risk of respiratory compromise as procedure may need to be delayed.  She verbalized understanding.   I looked up patient on [place] Controlled Substances Reporting System PMP AWARE and saw no activity that raised concern of inappropriate use.

## 2022-09-11 NOTE — Progress Notes (Unsigned)
PROVIDER NOTE: Information contained herein reflects review and annotations entered in association with encounter. Interpretation of such information and data should be left to medically-trained personnel. Information provided to patient can be located elsewhere in the medical record under "Patient Instructions". Document created using STT-dictation technology, any transcriptional errors that may result from process are unintentional.    Patient: Tricia Berry  Service Category: E/M  Provider: Gaspar Cola, MD  DOB: [birth year]  DOS: 09/13/2022  Referring Provider: Burnard Hawthorne, FNP  MRN: [id number]  Specialty: Interventional Pain Management  PCP: Burnard Hawthorne, FNP  Type: Established Patient  Setting: Ambulatory outpatient    Location: Office  Delivery: Face-to-face     HPI  Ms. Tricia Berry, a 51 y.o. year old female, is here today because of her No primary diagnosis found.. [name] primary complain today is No chief complaint on file. Last encounter: My last encounter with her was on 08/28/2022. Pertinent problems: [name] has OSTEOARTHRITIS, HANDS, BILATERAL; Arthralgia; BACK STRAIN, LUMBAR; Malignant neoplasm of upper-outer quadrant of left breast in female, estrogen receptor positive ([place]); Chronic low back pain (1ry area of Pain) (Bilateral) (R>L) w/o sciatica; 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[place]); Chronic pain syndrome; History of breast cancer (Left); History of mastectomy (Bilateral); Chronic calf pain (2ry area of Pain) (Bilateral); Chronic lower extremity pain (3ry area of Pain) (Bilateral); Chronic feet pain (Bilateral); Chronic upper extremity pain (4th area of Pain) (Bilateral) (L>R); Chronic hand pain (Bilateral) (L>R); Chronic generalized pain (5th area of Pain); Abnormal MRI, lumbar spine (10/13/2021); Lumbosacral facet arthropathy (Multilevel) (Bilateral); Lumbar spinal facet joint arthropathy with concurrent effusion (L3-4) (Right); Degenerative  lumbar rotatory levoscoliosis; 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 Pain Assessment: Severity of   is reported as a  /10. Location:    / . Onset:  . Quality:  . Timing:  . Modifying factor(s):  Marland Kitchen Vitals:  vitals were not taken for this visit.   Reason for encounter:  *** . ***  Pharmacotherapy Assessment  Analgesic: Oxycodone IR 10 mg tablet, 1 tab p.o. daily (# 30) (last filled on 06/19/2022) MME/day: 15 mg/day   Monitoring: [place] PMP: PDMP reviewed during this encounter.       Pharmacotherapy: No side-effects or adverse reactions reported. Compliance: No problems identified. Effectiveness: Clinically acceptable.  No notes on file  No results found for: "CBDTHCR" No results found for: "D8THCCBX" No results found for: "D9THCCBX"  UDS:  Summary  Date Value Ref Range Status  05/29/2022 Note  Final    Comment:    ==================================================================== Compliance Drug Analysis, Ur ==================================================================== Test                             Result       Flag       Units  Drug Present and Declared for Prescription Verification   Lorazepam                      1231         EXPECTED   ng/mg creat    Source of lorazepam is a scheduled prescription medication.    Oxycodone                      1391  EXPECTED   ng/mg creat   Noroxycodone                   5467         EXPECTED   ng/mg creat    Sources of oxycodone include scheduled prescription medications.    Noroxycodone is an expected metabolite of oxycodone.    Pregabalin                     PRESENT      EXPECTED   Fluoxetine                     PRESENT      EXPECTED   Norfluoxetine                  PRESENT       EXPECTED    Norfluoxetine is an expected metabolite of fluoxetine.  Drug Present not Declared for Prescription Verification   Oxazepam                       376          UNEXPECTED ng/mg creat   Temazepam                      41           UNEXPECTED ng/mg creat    Oxazepam and temazepam are expected metabolites of diazepam.    Oxazepam is also an expected metabolite of other benzodiazepine    drugs, including chlordiazepoxide, prazepam, clorazepate, halazepam,    and temazepam.  Oxazepam and temazepam are available as scheduled    prescription medications.    Acetaminophen                  PRESENT      UNEXPECTED  Drug Absent but Declared for Prescription Verification   Tizanidine                     Not Detected UNEXPECTED    Tizanidine, as indicated in the declared medication list, is not    always detected even when used as directed.    Prochlorperazine               Not Detected UNEXPECTED   Lidocaine                      Not Detected UNEXPECTED    Lidocaine, as indicated in the declared medication list, is not    always detected even when used as directed.  ==================================================================== Test                      Result    Flag   Units      Ref Range   Creatinine              54               mg/dL      >=20 ==================================================================== Declared Medications:  The flagging and interpretation on this report are based on the  following declared medications.  Unexpected results may arise from  inaccuracies in the declared medications.   **Note: The testing scope of this panel includes these medications:   Fluoxetine (Prozac)  Lorazepam (Ativan)  Oxycodone  Pregabalin (Lyrica)  Prochlorperazine (Compazine)   **Note: The testing scope of this panel does not include small to  moderate  amounts of these reported medications:   Tizanidine (Zanaflex)  Topical Lidocaine (EMLA)   **Note: The testing  scope of this panel does not include the  following reported medications:   Abemaciclib (Verzenio)  Anastrozole (Arimidex)  Calcium  Multivitamin  Nicotine  Ondansetron (Zofran)  Prilocaine (EMLA)  Probiotic  Rosuvastatin (Crestor)  Supplement  Trimethoprim  Vitamin B12  Vitamin D ==================================================================== For clinical consultation, please call [phone]. ====================================================================       ROS  Constitutional: Denies any fever or chills Gastrointestinal: No reported hemesis, hematochezia, vomiting, or acute GI distress Musculoskeletal: Denies any acute onset joint swelling, redness, loss of ROM, or weakness Neurological: No reported episodes of acute onset apraxia, aphasia, dysarthria, agnosia, amnesia, paralysis, loss of coordination, or loss of consciousness  Medication Review  Calcium-Vitamin D, FLUoxetine, LORazepam, Lactobacillus, Multiple Vitamins-Minerals, Vitamin D, abemaciclib, albuterol, amitriptyline, anastrozole, b complex vitamins, baclofen, cyanocobalamin, gentamicin ointment, naloxone, nicotine, olopatadine, ondansetron, oxyCODONE, pregabalin, rosuvastatin, and trimethoprim  History Review  Allergy: Ms. Aina is allergic to morphine, sertraline hcl, sulfa antibiotics, sulfamethoxazole, and sulfonamide derivatives. Drug: Ms. Cunning  reports no history of drug use. Alcohol:  reports that she does not currently use alcohol. Tobacco:  reports that she has been smoking cigarettes. She has been smoking an average of .25 packs per day. She has never used smokeless tobacco. Social: Ms. Diles  reports that she has been smoking cigarettes. She has been smoking an average of .25 packs per day. She has never used smokeless tobacco. She reports that she does not currently use alcohol. She reports that she does not use drugs. Medical:  has a past medical history of Anxiety, Breast  cancer ([place]), Diverticulitis, Family history of ovarian cancer, GERD (gastroesophageal reflux disease), IBS (irritable bowel syndrome), PONV (postoperative nausea and vomiting), and Scoliosis. Surgical: Ms. Lucus  has a past surgical history that includes Abdominal hysterectomy; Breast biopsy (Left, 09/15/2020); Breast biopsy (Left, 09/15/2020); IR IMAGING GUIDED PORT INSERTION (10/01/2020); Modified mastectomy (Left, 03/17/2021); Total mastectomy (Right, 03/17/2021); PORTA CATH REMOVAL (Right, 03/17/2021); Breast reconstruction with placement of tissue expander and flex hd (acellular hydrated dermis) (Bilateral, 03/17/2021); Removal of bilateral tissue expanders with placement of bilateral breast implants (Bilateral, 05/09/2021); and Colonoscopy with propofol (N/A, 11/21/2021). Family: family history includes Diverticulitis in her brother and mother; Gout in her father; Heart attack (age of onset: 31) in her father; Heart failure in her father; Hypertension in her father and mother; Osteoarthritis in her mother; Ovarian cancer in her paternal grandmother.  Laboratory Chemistry Profile   Renal Lab Results  Component Value Date   BUN 9 09/08/2022   CREATININE 0.76 09/08/2022   GFR 90.14 09/08/2022   GFRAA >90 05/09/2012   GFRNONAA 59 (L) 07/27/2022    Hepatic Lab Results  Component Value Date   AST 23 09/08/2022   ALT 14 09/08/2022   ALBUMIN 4.0 09/08/2022   ALKPHOS 75 09/08/2022    Electrolytes Lab Results  Component Value Date   NA 138 09/08/2022   K 3.8 09/08/2022   CL 102 09/08/2022   CALCIUM 9.3 09/08/2022   MG 1.8 05/23/2022    Bone Lab Results  Component Value Date   25OHVITD1 38 05/29/2022   25OHVITD2 1.2 05/29/2022   25OHVITD3 37 05/29/2022    Inflammation (CRP: Acute Phase) (ESR: Chronic Phase) Lab Results  Component Value Date   CRP 5 05/29/2022   ESRSEDRATE CANCELED 05/29/2022         Note: Above Lab results reviewed.  Recent Imaging Review  DG  Chest 2  View CLINICAL DATA:  Follow-up pneumonia  EXAM: CHEST - 2 VIEW  COMPARISON:  August 29, 2022 chest x-ray, chest CT August 30, 2022  FINDINGS: The heart size and mediastinal contours are stable. Patchy consolidation of the right lung and left upper lung are identified not significantly changed. There is no pleural effusion. Marked scoliosis of the spine identified.  IMPRESSION: Patchy consolidation of the right lung and left upper lung are not significantly changed.  Electronically Signed   By: Abelardo Diesel M.D.   On: 09/10/2022 09:49 Note: Reviewed        Physical Exam  General appearance: Well nourished, well developed, and well hydrated. In no apparent acute distress Mental status: Alert, oriented x 3 (person, place, & time)       Respiratory: No evidence of acute respiratory distress Eyes: PERLA Vitals: There were no vitals taken for this visit. BMI: Estimated body mass index is 22.46 kg/m as calculated from the following:   Height as of 08/29/22: 5' (1.524 m).   Weight as of 09/08/22: 115 lb (52.2 kg). Ideal: Ideal body weight: 45.5 kg (100 lb 4.9 oz) Adjusted ideal body weight: 48.2 kg (106 lb 3 oz)  Assessment   Diagnosis Status  No diagnosis found. Controlled Controlled Controlled   Updated Problems: No problems updated.  Plan of Care  Problem-specific:  No problem-specific Assessment & Plan notes found for this encounter.  Ms. JARIYAH HACKLEY has a current medication list which includes the following long-term medication(s): albuterol, amitriptyline, calcium-vitamin d, fluoxetine, oxycodone, pregabalin, and rosuvastatin.  Pharmacotherapy (Medications Ordered): No orders of the defined types were placed in this encounter.  Orders:  No orders of the defined types were placed in this encounter.  Follow-up plan:   No follow-ups on file.     Interventional Therapies  Risk  Complexity Considerations:   Estimated body mass index is 21.29 kg/m as  calculated from the following:   Height as of this encounter: 5' (1.524 m).   Weight as of this encounter: 109 lb (49.4 kg). WNL   Planned  Pending:   Referral to neurology for nerve conduction test (EMG/PNCV) of the lower and upper extremities (Bilateral)    Under consideration:   Diagnostic/therapeutic (Midline) caudal ESI + epidurogram #1  Diagnostic/therapeutic bilateral L3 transforaminal ESI #1  Diagnostic bilateral lumbar facet MBB (to include the L3-4 level) #1    Completed:   None at this time   Completed by other providers:   Therapeutic bilateral L3, L4, and L5 medial branch RFA x1 (02/16/2022) by Sharlet Salina, DO  Diagnostic/therapeutic bilateral L3, L4, and L5 MBB x2 (12/30/2021; 01/17/2022) by Sharlet Salina, DO  Diagnostic/therapeutic right L5-S1 TESI + right S1 TESI x1 (11/29/2021) by Sharlet Salina, DO  Therapeutic sacral trigger point injections x1 (09/30/2021) by Rosalia Hammers, DO    Therapeutic  Palliative (PRN) options:   None established     Recent Visits Date Type Provider Dept  08/14/22 Office Visit Milinda Pointer, MD [facility]  07/05/22 Office Visit Milinda Pointer, MD [facility]  Showing recent visits within past 90 days and meeting all other requirements Future Appointments Date Type Provider Dept  09/13/22 Appointment Milinda Pointer, MD [facility]  09/19/22 Appointment Milinda Pointer, MD [facility]  Showing future appointments within next 90 days and meeting all other requirements  I discussed the assessment and treatment plan with the patient. The patient was provided an opportunity to ask questions and all were  answered. The patient agreed with the plan and demonstrated an understanding of the instructions.  Patient advised to call back or seek an in-person evaluation if the symptoms or condition worsens.  Duration of encounter: *** minutes.  Total time on encounter, as per AMA  guidelines included both the face-to-face and non-face-to-face time personally spent by the physician and/or other qualified health care professional(s) on the day of the encounter (includes time in activities that require the physician or other qualified health care professional and does not include time in activities normally performed by clinical staff). Physician's time may include the following activities when performed: preparing to see the patient (eg, review of tests, pre-charting review of records) obtaining and/or reviewing separately obtained history performing a medically appropriate examination and/or evaluation counseling and educating the patient/family/caregiver ordering medications, tests, or procedures referring and communicating with other health care professionals (when not separately reported) documenting clinical information in the electronic or other health record independently interpreting results (not separately reported) and communicating results to the patient/ family/caregiver care coordination (not separately reported)  Note by: Gaspar Cola, MD Date: 09/13/2022; Time: 12:41 PM

## 2022-09-12 ENCOUNTER — Inpatient Hospital Stay: Payer: No Typology Code available for payment source

## 2022-09-12 ENCOUNTER — Inpatient Hospital Stay: Payer: No Typology Code available for payment source | Attending: Oncology

## 2022-09-12 ENCOUNTER — Encounter: Payer: Self-pay | Admitting: Oncology

## 2022-09-12 ENCOUNTER — Encounter: Payer: No Typology Code available for payment source | Admitting: Occupational Therapy

## 2022-09-12 ENCOUNTER — Inpatient Hospital Stay (HOSPITAL_BASED_OUTPATIENT_CLINIC_OR_DEPARTMENT_OTHER): Payer: No Typology Code available for payment source | Admitting: Oncology

## 2022-09-12 ENCOUNTER — Other Ambulatory Visit: Payer: Self-pay | Admitting: Family

## 2022-09-12 VITALS — BP 128/88 | HR 80 | Resp 16 | Wt 115.9 lb

## 2022-09-12 DIAGNOSIS — Z79811 Long term (current) use of aromatase inhibitors: Secondary | ICD-10-CM

## 2022-09-12 DIAGNOSIS — E2839 Other primary ovarian failure: Secondary | ICD-10-CM | POA: Diagnosis not present

## 2022-09-12 DIAGNOSIS — Z79899 Other long term (current) drug therapy: Secondary | ICD-10-CM | POA: Diagnosis not present

## 2022-09-12 DIAGNOSIS — Z170 Estrogen receptor positive status [ER+]: Secondary | ICD-10-CM

## 2022-09-12 DIAGNOSIS — G620 Drug-induced polyneuropathy: Secondary | ICD-10-CM | POA: Diagnosis not present

## 2022-09-12 DIAGNOSIS — C50412 Malignant neoplasm of upper-outer quadrant of left female breast: Secondary | ICD-10-CM | POA: Insufficient documentation

## 2022-09-12 DIAGNOSIS — J189 Pneumonia, unspecified organism: Secondary | ICD-10-CM

## 2022-09-12 DIAGNOSIS — Z5181 Encounter for therapeutic drug level monitoring: Secondary | ICD-10-CM | POA: Diagnosis not present

## 2022-09-12 DIAGNOSIS — T451X5A Adverse effect of antineoplastic and immunosuppressive drugs, initial encounter: Secondary | ICD-10-CM

## 2022-09-12 LAB — COMPREHENSIVE METABOLIC PANEL
ALT: 21 U/L (ref 0–44)
AST: 31 U/L (ref 15–41)
Albumin: 3.8 g/dL (ref 3.5–5.0)
Alkaline Phosphatase: 86 U/L (ref 38–126)
Anion gap: 9 (ref 5–15)
BUN: 8 mg/dL (ref 6–20)
CO2: 25 mmol/L (ref 22–32)
Calcium: 9.2 mg/dL (ref 8.9–10.3)
Chloride: 102 mmol/L (ref 98–111)
Creatinine, Ser: 0.83 mg/dL (ref 0.44–1.00)
GFR, Estimated: >60 mL/min (ref >60)
Glucose, Bld: 149 mg/dL — ABNORMAL HIGH (ref 70–99)
Potassium: 3.6 mmol/L (ref 3.5–5.1)
Sodium: 136 mmol/L (ref 135–145)
Total Bilirubin: 0.3 mg/dL (ref 0.3–1.2)
Total Protein: 7.4 g/dL (ref 6.5–8.1)

## 2022-09-12 LAB — CBC WITH DIFFERENTIAL/PLATELET
Abs Immature Granulocytes: 0.04 10*3/uL (ref 0.00–0.07)
Basophils Absolute: 0.1 10*3/uL (ref 0.0–0.1)
Basophils Relative: 1 %
Eosinophils Absolute: 0.2 10*3/uL (ref 0.0–0.5)
Eosinophils Relative: 3 %
HCT: 37.7 % (ref 36.0–46.0)
Hemoglobin: 12.4 g/dL (ref 12.0–15.0)
Immature Granulocytes: 1 %
Lymphocytes Relative: 33 %
Lymphs Abs: 2.1 10*3/uL (ref 0.7–4.0)
MCH: 31.6 pg (ref 26.0–34.0)
MCHC: 32.9 g/dL (ref 30.0–36.0)
MCV: 96.2 fL (ref 80.0–100.0)
Monocytes Absolute: 0.6 10*3/uL (ref 0.1–1.0)
Monocytes Relative: 9 %
Neutro Abs: 3.4 10*3/uL (ref 1.7–7.7)
Neutrophils Relative %: 53 %
Platelets: 205 10*3/uL (ref 150–400)
RBC: 3.92 MIL/uL (ref 3.87–5.11)
RDW: 14.6 % (ref 11.5–15.5)
WBC: 6.3 10*3/uL (ref 4.0–10.5)
nRBC: 0 % (ref 0.0–0.2)

## 2022-09-12 LAB — MAGNESIUM: Magnesium: 1.9 mg/dL (ref 1.7–2.4)

## 2022-09-12 MED ORDER — ZOLEDRONIC ACID 4 MG/100ML IV SOLN
4.0000 mg | INTRAVENOUS | Status: DC
  Administered 2022-09-12: 4 mg via INTRAVENOUS
  Filled 2022-09-12: qty 100

## 2022-09-12 MED ORDER — SODIUM CHLORIDE 0.9 % IV SOLN
INTRAVENOUS | Status: DC
  Filled 2022-09-12: qty 250

## 2022-09-12 NOTE — Patient Instructions (Signed)
[facility] CANCER CTR AT [place]  Discharge Instructions: Thank you for choosing [facility] to provide your oncology and hematology care.  If you have a lab appointment with the [facility], please go directly to the [facility] and check in at the registration area.  Wear comfortable clothing and clothing appropriate for easy access to any Portacath or PICC line.   We strive to give you quality time with your provider. You may need to reschedule your appointment if you arrive late (15 or more minutes).  Arriving late affects you and other patients whose appointments are after yours.  Also, if you miss three or more appointments without notifying the office, you may be dismissed from the clinic at the provider's discretion.      For prescription refill requests, have your pharmacy contact our office and allow 72 hours for refills to be completed.    Today you received the following chemotherapy and/or immunotherapy agents Zometa.      To help prevent nausea and vomiting after your treatment, we encourage you to take your nausea medication as directed.  BELOW ARE SYMPTOMS THAT SHOULD BE REPORTED IMMEDIATELY: *FEVER GREATER THAN 100.4 F (38 C) OR HIGHER *CHILLS OR SWEATING *NAUSEA AND VOMITING THAT IS NOT CONTROLLED WITH YOUR NAUSEA MEDICATION *UNUSUAL SHORTNESS OF BREATH *UNUSUAL BRUISING OR BLEEDING *URINARY PROBLEMS (pain or burning when urinating, or frequent urination) *BOWEL PROBLEMS (unusual diarrhea, constipation, pain near the anus) TENDERNESS IN MOUTH AND THROAT WITH OR WITHOUT PRESENCE OF ULCERS (sore throat, sores in mouth, or a toothache) UNUSUAL RASH, SWELLING OR PAIN  UNUSUAL VAGINAL DISCHARGE OR ITCHING   Items with * indicate a potential emergency and should be followed up as soon as possible or go to the Emergency Department if any problems should occur.  Please show the CHEMOTHERAPY ALERT CARD or IMMUNOTHERAPY ALERT CARD at check-in to the  Emergency Department and triage nurse.  Should you have questions after your visit or need to cancel or reschedule your appointment, please contact [facility] CANCER [place] AT [place]  [phone] and follow the prompts.  Office hours are 8:00 a.m. to 4:30 p.m. Monday - Friday. Please note that voicemails left after 4:00 p.m. may not be returned until the following business day.  We are closed weekends and major holidays. You have access to a nurse at all times for urgent questions. Please call the main number to the clinic [phone] and follow the prompts.  For any non-urgent questions, you may also contact your provider using MyChart. We now offer e-Visits for anyone 54 and older to request care online for non-urgent symptoms. For details visit [email or web address].   Also download the MyChart app! Go to the app store, search "MyChart", open the app, select [facility], and log in with your MyChart username and password.  Masks are optional in the cancer centers. If you would like for your care team to wear a mask while they are taking care of you, please let them know. For doctor visits, patients may have with them one support person who is at least 52 years old. At this time, visitors are not allowed in the infusion area.

## 2022-09-12 NOTE — Progress Notes (Signed)
Hematology/Oncology Consult note [facility]  [phone] [phone]  Patient Care Team: Burnard Hawthorne, FNP as PCP - General (Family Medicine) Kate Sable, MD as PCP - Cardiology (Cardiology) Rico Junker, RN as Oncology Nurse Navigator Sindy Guadeloupe, MD as Consulting Physician (Hematology and Oncology) Gloris Ham, RN as Registered Nurse (Oncology) Borders, Kirt Boys, NP as Nurse Practitioner [facility] and Palliative Medicine)   Name of the patient: Tricia Berry  [id number]  26-Apr-1971   Date of visit: 09/12/22  Diagnosis- invasive mammary carcinoma of the left breast stage III ypT2 ypN2 cM0 ER/PR positive and HER2 positive by [facility]    Chief complaint/ Reason for visit-routine follow-up of breast cancer on Verzenio  Heme/Onc history: 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  She then noticed that her lump is gradually getting larger with an area of ulceration on the skin and underwent a diagnostic bilateral mammogram on 09/08/2020 which showed hypoechoic interconnected masses in the left breast from 10:00 to 2 o'clock position spanning at least an area of 4.8 cm.  Ultrasound also showed 5 abnormal lymph nodes in the axilla with cortical thickening.  Both the mass and the lymph nodes were biopsied and was consistent with invasive mammary carcinoma grade 2.  Tumor was ER +91- 100%, PR +11 to 20% and HER-2 negative.  Ki-67 15%.    PET CT scan showed hypermetabolism in the area of the left breast but no evidence of hypermetabolism in the left axilla Or evidence of distant metastatic disease.   Neoadjuvant dose dense ACT chemotherapy started on 10/08/2020. Interim ultrasound after 4 cycles of dose dense AC chemotherapy showed overall decrease in  volume of the tumor.  Nodularity previously seen on ultrasound was also decreased in size.   Patient completed neoadjuvant AC Taxol chemotherapy and underwent bilateral mastectomy with reconstruction.Final pathology showed fibrocystic changes in the right breast with no malignancy.  3.6 cm invasive mammary carcinoma in the left breast.  7 out of 16 lymph nodes positive for malignancy.  Treatment effect in the breast present but not robust.  Treatment effect in the lymph nodes minimal.  Margins negative.  Overall grade 2.  Extranodal extension present.  ER greater than 90% positive, PR 15% positive.  HER2 +2 equivocal and positive by FISH on the primary breast specimen.  HER2 testing was however negative in the lymph nodes.  Patient completed 1 year of adjuvant Herceptin and Perjeta in August 2023.    Interval history-patient reports random episodes of spasms involving her bilateral legs as well as neck and occasionally her upper extremities.  She is taking as needed baclofen but is not helping.  She is currently taking oxycodone 5 mg twice a day but still reports generalized body aches.  She is currently following up with [facility] and will be receiving another injection for her back next week.  Patient was supposed to go for redo of her right breast reconstruction surgery and therefore held her Verzenio for 2 weeks.  However she was found to have pneumonia and the surgery has now been postponed by 1 month.  ECOG PS- 1 Pain scale- 4 Opioid associated constipation- no  Review of systems- Review of Systems  Constitutional:  Positive for malaise/fatigue. Negative for chills, fever  and weight loss.  HENT:  Negative for congestion, ear discharge and nosebleeds.   Eyes:  Negative for blurred vision.  Respiratory:  Negative for cough, hemoptysis, sputum production, shortness of breath and wheezing.   Cardiovascular:  Negative for chest pain, palpitations, orthopnea and claudication.   Gastrointestinal:  Negative for abdominal pain, blood in stool, constipation, diarrhea, heartburn, melena, nausea and vomiting.  Genitourinary:  Negative for dysuria, flank pain, frequency, hematuria and urgency.  Musculoskeletal:  Positive for back pain and joint pain. Negative for myalgias.  Skin:  Negative for rash.  Neurological:  Negative for dizziness, tingling, focal weakness, seizures, weakness and headaches.  Endo/Heme/Allergies:  Does not bruise/bleed easily.  Psychiatric/Behavioral:  Negative for depression and suicidal ideas. The patient does not have insomnia.       Allergies  Allergen Reactions   Morphine Nausea And Vomiting and Other (See Comments)    migranes Other reaction(s): Headache Migraine, vomiting   Sertraline Hcl     REACTION: Worsened symptoms of IBS   Sulfa Antibiotics Rash   Sulfamethoxazole Rash   Sulfonamide Derivatives Rash     Past Medical History:  Diagnosis Date   Anxiety    Breast cancer ([place])    Diverticulitis    Family history of ovarian cancer    GERD (gastroesophageal reflux disease)    IBS (irritable bowel syndrome)    PONV (postoperative nausea and vomiting)    severe migraine and vomiting post anesthesia   Scoliosis      Past Surgical History:  Procedure Laterality Date   ABDOMINAL HYSTERECTOMY     still has ovaries, no gyn cancer, hysterectomy due to endometriosis. NO cervix on exam 01/10/21   BREAST BIOPSY Left 09/15/2020   [place] bx of mass, path pending, Q marker   BREAST BIOPSY Left 09/15/2020   [place] bx of LN, hydromarker, path pending   BREAST RECONSTRUCTION WITH PLACEMENT OF TISSUE EXPANDER AND FLEX HD (ACELLULAR HYDRATED DERMIS) Bilateral 03/17/2021   Procedure: IMMEDIATE BILATERAL BREAST RECONSTRUCTION WITH PLACEMENT OF TISSUE EXPANDER AND FLEX HD (ACELLULAR HYDRATED DERMIS);  Surgeon: Wallace Going, DO;  Location: [place];  Service: Plastics;  Laterality: Bilateral;   COLONOSCOPY WITH PROPOFOL N/A  11/21/2021   Procedure: COLONOSCOPY WITH PROPOFOL;  Surgeon: Lin Landsman, MD;  Location: [facility] ENDOSCOPY;  Service: Gastroenterology;  Laterality: N/A;   IR IMAGING GUIDED PORT INSERTION  10/01/2020   MODIFIED MASTECTOMY Left 03/17/2021   Procedure: LEFT MODIFIED RADICAL MASTECTOMY;  Surgeon: Erroll Luna, MD;  Location: [place];  Service: General;  Laterality: Left;   PORTA CATH REMOVAL Right 03/17/2021   Procedure: PORTA CATH REMOVAL;  Surgeon: Erroll Luna, MD;  Location: [place];  Service: General;  Laterality: Right;   REMOVAL OF BILATERAL TISSUE EXPANDERS WITH PLACEMENT OF BILATERAL BREAST IMPLANTS Bilateral 05/09/2021   Procedure: REMOVAL OF BILATERAL TISSUE EXPANDERS WITH PLACEMENT OF BILATERAL BREAST IMPLANTS;  Surgeon: Wallace Going, DO;  Location: [place];  Service: Plastics;  Laterality: Bilateral;  90 min   TOTAL MASTECTOMY Right 03/17/2021   Procedure: TOTAL MASTECTOMY;  Surgeon: Erroll Luna, MD;  Location: [place];  Service: General;  Laterality: Right;    Social History   Socioeconomic History   Marital status: Divorced    Spouse name: Not on file   Number of children: 2   Years of education: Not on file   Highest education level: Not on file  Occupational History   Occupation: Nurse  Employer: [employer]  Tobacco Use   Smoking status: Some Days    Packs/day: 0.25    Types: Cigarettes   Smokeless tobacco: Never   Tobacco comments:    Pt trying to quit now. Has reduced amount significantly  Vaping Use   Vaping Use: Never used  Substance and Sexual Activity   Alcohol use: Not Currently   Drug use: No   Sexual activity: Not Currently    Birth control/protection: None  Other Topics Concern   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 She and her spouse are primary caregivers.     Social Determinants of Health   Financial Resource Strain: Not on file  Food Insecurity: Not on file  Transportation Needs: Not on file  Physical Activity: Not on file  Stress: Not on file  Social Connections: Not on file  Intimate Partner Violence: Not on file    Family History  Problem Relation Age of Onset   Hypertension Mother    Osteoarthritis Mother    Diverticulitis Mother    Heart failure Father    Hypertension Father    Gout Father    Heart attack Father 72   Diverticulitis Brother    Ovarian cancer Paternal Grandmother      Current Outpatient Medications:    albuterol (VENTOLIN HFA) 108 (90 Base) MCG/ACT inhaler, Inhale 2 puffs into the lungs every 6 (six) hours as needed for wheezing or shortness of breath., Disp: 6.7 g, Rfl: 0   anastrozole (ARIMIDEX) 1 MG tablet, Take 1 tablet (1 mg total) by mouth daily., Disp: 30 tablet, Rfl: 5   baclofen (LIORESAL) 10 MG tablet, Take 1 tablet (10 mg total) by mouth 3 (three) times daily as needed for muscle spasms., Disp: 15 tablet, Rfl: 1   Budeson-Glycopyrrol-Formoterol (BREZTRI AEROSPHERE IN), Inhale into the lungs., Disp: , Rfl:    FLUoxetine (PROZAC) 20 MG capsule, Take 1 capsule (20 mg total) by mouth every morning., Disp: 90 capsule, Rfl: 3   Lactobacillus (PROBIOTIC ACIDOPHILUS PO), Take 1 capsule by mouth daily., Disp: , Rfl:    LORazepam (ATIVAN) 0.5 MG tablet, Take 1 tablet (0.5 mg total) by mouth at bedtime., Disp: 30 tablet, Rfl: 2   nicotine (NICODERM CQ - DOSED IN MG/24 HOURS) 21 mg/24hr patch, use 1 patch daily, Disp: 28 patch, Rfl: 0   oxyCODONE (OXY IR/ROXICODONE) 5 MG immediate release tablet, Take 1 tablet (5 mg total) by mouth 2 (two) times daily. Must last 30 days., Disp: 60 tablet, Rfl: 0   pregabalin (LYRICA) 150 MG capsule, Take 1 capsule (150 mg total) by mouth in the morning, at noon, and at bedtime., Disp: 90 capsule, Rfl: 2   trimethoprim (TRIMPEX) 100 MG tablet, Take 1 tablet (100 mg total) by  mouth daily., Disp: 90 tablet, Rfl: 3   abemaciclib (VERZENIO) 100 MG tablet, Take 1 tablet (100 mg total) by mouth 2 (two) times daily. (Patient not taking: Reported on 09/12/2022), Disp: 56 tablet, Rfl: 1   amitriptyline (ELAVIL) 50 MG tablet, Take 1 tablet (50 mg total) by mouth at bedtime. (Patient not taking: Reported on 09/12/2022), Disp: 60 tablet, Rfl: 1   b complex vitamins capsule, Take 1 capsule by mouth daily. (Patient not taking: Reported on 09/12/2022), Disp: , Rfl:    CALCIUM-VITAMIN D PO, Take 1 tablet by mouth daily at 12 noon. (Patient not taking: Reported on 09/12/2022),  Disp: , Rfl:    cyanocobalamin (VITAMIN B12) 1000 MCG/ML injection, 1000 mcg (1 mL) intramuscular injection in the thigh ( vastus lateralis) once per month., Disp: 3 mL, Rfl: 4   gentamicin ointment (GARAMYCIN) 0.1 %, Apply to nose three times daily for 2 weeks then as needed, Disp: 30 g, Rfl: 12   Multiple Vitamins-Minerals (MULTIVITAL PO), Take 1 Dose by mouth daily., Disp: , Rfl:    naloxone (NARCAN) nasal spray 4 mg/0.1 mL, Place 1 spray into the nose as needed for opioid-induced respiratory depresssion. In case of emergency (overdose), spray once into each nostril. If no response within 3 minutes, repeat application and call [phone]., Disp: 2 each, Rfl: 0   olopatadine (PATADAY) 0.1 % ophthalmic solution, 1 drop 2 (two) times daily. (Patient not taking: Reported on 09/12/2022), Disp: , Rfl:    ondansetron (ZOFRAN-ODT) 8 MG disintegrating tablet, Take 1 tablet (8 mg total) by mouth every 8 (eight) hours as needed for nausea or vomiting. (Patient not taking: Reported on 09/12/2022), Disp: 45 tablet, Rfl: 2   rosuvastatin (CRESTOR) 5 MG tablet, Take 1 tablet (5 mg total) by mouth every evening. (Patient not taking: Reported on 09/12/2022), Disp: 90 tablet, Rfl: 3   VITAMIN D PO, Take 2,000 Int'l Units/day by mouth daily at 12 noon. (Patient not taking: Reported on 09/12/2022), Disp: , Rfl:  No current  facility-administered medications for this visit.  Facility-Administered Medications Ordered in Other Visits:    0.9 %  sodium chloride infusion, , Intravenous, Continuous, Sindy Guadeloupe, MD, Stopped at 09/12/22 1516   acetaminophen (TYLENOL) 325 MG tablet, , , ,    diphenhydrAMINE (BENADRYL) 25 mg capsule, , , ,    goserelin (ZOLADEX) injection 3.6 mg, 3.6 mg, Subcutaneous, Q28 days, Sindy Guadeloupe, MD, 3.6 mg at 05/04/21 1150   heparin lock flush 100 unit/mL, 500 Units, Intravenous, Once, Sindy Guadeloupe, MD   Zoledronic Acid (ZOMETA) 4 MG/100ML IVPB, , , ,    Zoledronic Acid (ZOMETA) IVPB 4 mg, 4 mg, Intravenous, Q90 days, Sindy Guadeloupe, MD, Stopped at 09/12/22 1509  Physical exam:  Vitals:   09/12/22 1338  BP: 128/88  Pulse: 80  Resp: 16  SpO2: 98%  Weight: 115 lb 14.4 oz (52.6 kg)   Physical Exam      Latest Ref Rng & Units 09/12/2022    1:01 PM  CMP  Glucose 70 - 99 mg/dL 149   BUN 6 - 20 mg/dL 8   Creatinine 0.44 - 1.00 mg/dL 0.83   Sodium 135 - 145 mmol/L 136   Potassium 3.5 - 5.1 mmol/L 3.6   Chloride 98 - 111 mmol/L 102   CO2 22 - 32 mmol/L 25   Calcium 8.9 - 10.3 mg/dL 9.2   Total Protein 6.5 - 8.1 g/dL 7.4   Total Bilirubin 0.3 - 1.2 mg/dL 0.3   Alkaline Phos 38 - 126 U/L 86   AST 15 - 41 U/L 31   ALT 0 - 44 U/L 21       Latest Ref Rng & Units 09/12/2022    1:01 PM  CBC  WBC 4.0 - 10.5 K/uL 6.3   Hemoglobin 12.0 - 15.0 g/dL 12.4   Hematocrit 36.0 - 46.0 % 37.7   Platelets 150 - 400 K/uL 205     No images are attached to the encounter.  DG Chest 2 View  Result Date: 09/10/2022 CLINICAL DATA:  Follow-up pneumonia EXAM: CHEST - 2 VIEW COMPARISON:  August 29, 2022 chest x-ray, chest CT August 30, 2022 FINDINGS: The heart size and mediastinal contours are stable. Patchy consolidation of the right lung and left upper lung are identified not significantly changed. There is no pleural effusion. Marked scoliosis of the spine identified. IMPRESSION: Patchy  consolidation of the right lung and left upper lung are not significantly changed. Electronically Signed   By: Abelardo Diesel M.D.   On: 09/10/2022 09:49   CT Angio Chest W/Cm &/Or Wo Cm  Result Date: 08/30/2022 CLINICAL DATA:  Shortness of breath, chest pain. EXAM: CT ANGIOGRAPHY CHEST WITH CONTRAST TECHNIQUE: Multidetector CT imaging of the chest was performed using the standard protocol during bolus administration of intravenous contrast. Multiplanar CT image reconstructions and MIPs were obtained to evaluate the vascular anatomy. RADIATION DOSE REDUCTION: This exam was performed according to the departmental dose-optimization program which includes automated exposure control, adjustment of the mA and/or kV according to patient size and/or use of iterative reconstruction technique. CONTRAST:  17m OMNIPAQUE IOHEXOL 350 MG/ML SOLN COMPARISON:  December 22, 2021.  April 18, 2021. FINDINGS: Cardiovascular: Satisfactory opacification of the pulmonary arteries to the segmental level. No evidence of pulmonary embolism. Normal heart size. No pericardial effusion. Mediastinum/Nodes: No enlarged mediastinal, hilar, or axillary lymph nodes. Thyroid gland, trachea, and esophagus demonstrate no significant findings. Lungs/Pleura: No pneumothorax or pleural effusion is noted. Multiple patchy airspace opacities are noted bilaterally consistent with multifocal pneumonia. Upper Abdomen: No acute abnormality. Musculoskeletal: No chest wall abnormality. No acute or significant osseous findings. Review of the MIP images confirms the above findings. IMPRESSION: No definite evidence of pulmonary embolus. Multiple patchy airspace opacities are noted bilaterally consistent with multifocal pneumonia. Electronically Signed   By: JMarijo ConceptionM.D.   On: 08/30/2022 14:03   DG Chest 2 View  Result Date: 08/29/2022 CLINICAL DATA:  Shortness of breath.  Cough with hypoxia. EXAM: CHEST - 2 VIEW COMPARISON:  CT 12/22/2021 PET CT  04/18/2021. FINDINGS: Heart size is normal. Chronic scoliotic curvature convex to the right. Abnormal pulmonary markings affecting both lungs most consistent with chronic scarring. An element of active pneumonia is not excluded by radiography. Consider chest CT if concern persists. No visible effusion. IMPRESSION: Abnormal pulmonary markings most consistent with chronic scarring. An element of active pneumonia is not excluded by radiography. Consider chest CT if concern persists. Electronically Signed   By: MNelson ChimesM.D.   On: 08/29/2022 13:27     Assessment and plan- Patient is a 51y.o. female with history of stage III left breast cancer ER/PR positive and mixed pathology of HER2 positive and HER2 negative.  She is here for routine follow-up of following issues.  Breast cancer: Patient's hormone levels checked in October 2023 were not consistently in the postmenopausal range.  And therefore initiating her Zoladex shots again and she will continue those monthly.  She is willing to consider bilateral oophorectomy at this time in order to avoid getting shots for a prolonged basis.  She will get in touch with GYN about it.  It is unclear if patient's symptoms of body spasms are related to her aromatase inhibitors.  Previously she was on letrozole and was then switched to Arimidex.  I have asked her to hold off on taking Arimidex for 2 weeks and see if her spasms get better.  If they do then I will plan to switch her to exemestane at that time.  Presently patient's Verzenio is on hold due to recent diagnosis of pneumonia as well as  upcoming breast surgery in 1 month's time.  She will restart her Verzenio 2 weeks after surgery.  She will receive her Zometa today and continue to receive it every 3 months.  Patient has persistent generalized body aches as well as pain from her peripheral neuropathy.  She is currently following up with [facility] but tells me that they are not able to take her on  for chronic opioid management as they are at their capacity to prescribe opioid medications.  She was referred by her primary care provider to [facility] pain associated's but tells me that they will not take her on if she follows up with [facility].  Patient is willing to stop following up with [facility] and follow-up again referred pain associated if they can take her on as a patient.  We will notify them about this.  I will release her back in 2 months with labs   Visit Diagnosis 1. Chemotherapy-induced peripheral neuropathy ([place])   2. High risk medication use   3. Visit for monitoring Arimidex therapy      Dr. Randa Evens, MD, MPH [facility] [phone] 09/12/2022 4:18 PM

## 2022-09-13 ENCOUNTER — Other Ambulatory Visit: Payer: Self-pay

## 2022-09-13 ENCOUNTER — Encounter: Payer: Self-pay | Admitting: Pain Medicine

## 2022-09-13 ENCOUNTER — Ambulatory Visit: Payer: No Typology Code available for payment source | Attending: Pain Medicine | Admitting: Pain Medicine

## 2022-09-13 VITALS — BP 121/83 | HR 87 | Temp 97.1°F | Ht 60.0 in | Wt 115.0 lb

## 2022-09-13 DIAGNOSIS — M47817 Spondylosis without myelopathy or radiculopathy, lumbosacral region: Secondary | ICD-10-CM | POA: Diagnosis present

## 2022-09-13 DIAGNOSIS — R52 Pain, unspecified: Secondary | ICD-10-CM | POA: Insufficient documentation

## 2022-09-13 DIAGNOSIS — M79671 Pain in right foot: Secondary | ICD-10-CM | POA: Insufficient documentation

## 2022-09-13 DIAGNOSIS — M4807 Spinal stenosis, lumbosacral region: Secondary | ICD-10-CM | POA: Diagnosis present

## 2022-09-13 DIAGNOSIS — Z79899 Other long term (current) drug therapy: Secondary | ICD-10-CM | POA: Diagnosis present

## 2022-09-13 DIAGNOSIS — Z853 Personal history of malignant neoplasm of breast: Secondary | ICD-10-CM

## 2022-09-13 DIAGNOSIS — Z79891 Long term (current) use of opiate analgesic: Secondary | ICD-10-CM | POA: Diagnosis present

## 2022-09-13 DIAGNOSIS — M79661 Pain in right lower leg: Secondary | ICD-10-CM

## 2022-09-13 DIAGNOSIS — M2540 Effusion, unspecified joint: Secondary | ICD-10-CM | POA: Insufficient documentation

## 2022-09-13 DIAGNOSIS — M4156 Other secondary scoliosis, lumbar region: Secondary | ICD-10-CM

## 2022-09-13 DIAGNOSIS — G894 Chronic pain syndrome: Secondary | ICD-10-CM

## 2022-09-13 DIAGNOSIS — M79602 Pain in left arm: Secondary | ICD-10-CM | POA: Diagnosis present

## 2022-09-13 DIAGNOSIS — M545 Low back pain, unspecified: Secondary | ICD-10-CM | POA: Insufficient documentation

## 2022-09-13 DIAGNOSIS — M79605 Pain in left leg: Secondary | ICD-10-CM | POA: Insufficient documentation

## 2022-09-13 DIAGNOSIS — M5416 Radiculopathy, lumbar region: Secondary | ICD-10-CM

## 2022-09-13 DIAGNOSIS — M79601 Pain in right arm: Secondary | ICD-10-CM | POA: Diagnosis present

## 2022-09-13 DIAGNOSIS — M79672 Pain in left foot: Secondary | ICD-10-CM | POA: Insufficient documentation

## 2022-09-13 DIAGNOSIS — G8929 Other chronic pain: Secondary | ICD-10-CM | POA: Diagnosis present

## 2022-09-13 DIAGNOSIS — M79662 Pain in left lower leg: Secondary | ICD-10-CM | POA: Diagnosis present

## 2022-09-13 DIAGNOSIS — M79604 Pain in right leg: Secondary | ICD-10-CM | POA: Insufficient documentation

## 2022-09-13 DIAGNOSIS — M47819 Spondylosis without myelopathy or radiculopathy, site unspecified: Secondary | ICD-10-CM

## 2022-09-13 MED ORDER — OXYCODONE HCL 5 MG PO TABS
5.0000 mg | ORAL_TABLET | Freq: Two times a day (BID) | ORAL | 0 refills | Status: DC
  Filled 2022-09-18: qty 60, 30d supply, fill #0

## 2022-09-13 NOTE — Progress Notes (Signed)
Nursing Pain Medication Assessment:  Safety precautions to be maintained throughout the outpatient stay will include: orient to surroundings, keep bed in low position, maintain call bell within reach at all times, provide assistance with transfer out of bed and ambulation.  Medication Inspection Compliance: Pill count conducted under aseptic conditions, in front of the patient. Neither the pills nor the bottle was removed from the patient's sight at any time. Once count was completed pills were immediately returned to the patient in their original bottle.  Medication: Oxycodone IR Pill/Patch Count:  10 of 60 pills remain Pill/Patch Appearance: Markings consistent with prescribed medication Bottle Appearance: Standard pharmacy container. Clearly labeled. Filled Date: 63 / 27 / 2023 Last Medication intake:  TodaySafety precautions to be maintained throughout the outpatient stay will include: orient to surroundings, keep bed in low position, maintain call bell within reach at all times, provide assistance with transfer out of bed and ambulation.

## 2022-09-13 NOTE — Patient Instructions (Signed)
______________________________________________________________________  Preparing for your procedure  During your procedure appointment there will be: No Prescription Refills. No disability issues to discussed. No medication changes or discussions.  Instructions: Food intake: Avoid eating anything solid for at least 8 hours prior to your procedure. Clear liquid intake: You may take clear liquids such as water up to 2 hours prior to your procedure. (No carbonated drinks. No soda.) Transportation: Unless otherwise stated by your physician, bring a driver. Morning Medicines: Except for blood thinners, take all of your other morning medications with a sip of water. Make sure to take your heart and blood pressure medicines. If your blood pressure's lower number is above 100, the case will be rescheduled. Blood thinners: If you take a blood thinner, but were not instructed to stop it, call our office [phone] and ask to talk to a nurse. Not stopping a blood thinner prior to certain procedures could lead to serious complications. Diabetics on insulin: Notify the staff so that you can be scheduled 1st case in the morning. If your diabetes requires high dose insulin, take only  of your normal insulin dose the morning of the procedure and notify the staff that you have done so. Preventing infections: Shower with an antibacterial soap the morning of your procedure.  Build-up your immune system: Take 1000 mg of Vitamin C with every meal (3 times a day) the day prior to your procedure. Antibiotics: Inform the nursing staff if you are taking any antibiotics or if you have any conditions that may require antibiotics prior to procedures. (Example: recent joint implants)   Pregnancy: If you are pregnant make sure to notify the nursing staff. Not doing so may result in injury to the fetus, including death.  Sickness: If you have a cold, fever, or any active infections, call and cancel or reschedule your  procedure. Receiving steroids while having an infection may result in complications. Arrival: You must be in the facility at least 30 minutes prior to your scheduled procedure. Tardiness: Your scheduled time is also the cutoff time. If you do not arrive at least 15 minutes prior to your procedure, you will be rescheduled.  Children: Do not bring any children with you. Make arrangements to keep them home. Dress appropriately: There is always a possibility that your clothing may get soiled. Avoid long dresses. Valuables: Do not bring any jewelry or valuables.  Reasons to call and reschedule or cancel your procedure: (Following these recommendations will minimize the risk of a serious complication.) Surgeries: Avoid having procedures within 2 weeks of any surgery. (Avoid for 2 weeks before or after any surgery). Flu Shots: Avoid having procedures within 2 weeks of a flu shots or . (Avoid for 2 weeks before or after immunizations). Barium: Avoid having a procedure within 7-10 days after having had a radiological study involving the use of radiological contrast. (Myelograms, Barium swallow or enema study). Heart attacks: Avoid any elective procedures or surgeries for the initial 6 months after a "Myocardial Infarction" (Heart Attack). Blood thinners: It is imperative that you stop these medications before procedures. Let us know if you if you take any blood thinner.  Infection: Avoid procedures during or within two weeks of an infection (including chest colds or gastrointestinal problems). Symptoms associated with infections include: Localized redness, fever, chills, night sweats or profuse sweating, burning sensation when voiding, cough, congestion, stuffiness, runny nose, sore throat, diarrhea, nausea, vomiting, cold or Flu symptoms, recent or current infections. It is specially important if the infection is  over the area that we intend to treat. Heart and lung problems: Symptoms that may suggest an  active cardiopulmonary problem include: cough, chest pain, breathing difficulties or shortness of breath, dizziness, ankle swelling, uncontrolled high or unusually low blood pressure, and/or palpitations. If you are experiencing any of these symptoms, cancel your procedure and contact your primary care physician for an evaluation.  Remember:  Regular Business hours are:  Monday to Thursday 8:00 AM to 4:00 PM  Provider's Schedule: Milinda Pointer, MD:  Procedure days: Tuesday and Thursday 7:30 AM to 4:00 PM  Gillis Santa, MD:  Procedure days: Monday and Wednesday 7:30 AM to 4:00 PM  ______________________________________________________________________    ____________________________________________________________________________________________  General Risks and Possible Complications  Patient Responsibilities: It is important that you read this as it is part of your informed consent. It is our duty to inform you of the risks and possible complications associated with treatments offered to you. It is your responsibility as a patient to read this and to ask questions about anything that is not clear or that you believe was not covered in this document.  Patient's Rights: You have the right to refuse treatment. You also have the right to change your mind, even after initially having agreed to have the treatment done. However, under this last option, if you wait until the last second to change your mind, you may be charged for the materials used up to that point.  Introduction: Medicine is not an Chief Strategy Officer. Everything in Medicine, including the lack of treatment(s), carries the potential for danger, harm, or loss (which is by definition: Risk). In Medicine, a complication is a secondary problem, condition, or disease that can aggravate an already existing one. All treatments carry the risk of possible complications. The fact that a side effects or complications occurs, does not imply  that the treatment was conducted incorrectly. It must be clearly understood that these can happen even when everything is done following the highest safety standards.  No treatment: You can choose not to proceed with the proposed treatment alternative. The "PRO(s)" would include: avoiding the risk of complications associated with the therapy. The "CON(s)" would include: not getting any of the treatment benefits. These benefits fall under one of three categories: diagnostic; therapeutic; and/or palliative. Diagnostic benefits include: getting information which can ultimately lead to improvement of the disease or symptom(s). Therapeutic benefits are those associated with the successful treatment of the disease. Finally, palliative benefits are those related to the decrease of the primary symptoms, without necessarily curing the condition (example: decreasing the pain from a flare-up of a chronic condition, such as incurable terminal cancer).  General Risks and Complications: These are associated to most interventional treatments. They can occur alone, or in combination. They fall under one of the following six (6) categories: no benefit or worsening of symptoms; bleeding; infection; nerve damage; allergic reactions; and/or death. No benefits or worsening of symptoms: In Medicine there are no guarantees, only probabilities. No healthcare provider can ever guarantee that a medical treatment will work, they can only state the probability that it may. Furthermore, there is always the possibility that the condition may worsen, either directly, or indirectly, as a consequence of the treatment. Bleeding: This is more common if the patient is taking a blood thinner, either prescription or over the counter (example: Goody Powders, Fish oil, Aspirin, Garlic, etc.), or if suffering a condition associated with impaired coagulation (example: Hemophilia, cirrhosis of the liver, low platelet counts, etc.). However, even if  you  do not have one on these, it can still happen. If you have any of these conditions, or take one of these drugs, make sure to notify your treating physician. Infection: This is more common in patients with a compromised immune system, either due to disease (example: diabetes, cancer, human immunodeficiency virus [HIV], etc.), or due to medications or treatments (example: therapies used to treat cancer and rheumatological diseases). However, even if you do not have one on these, it can still happen. If you have any of these conditions, or take one of these drugs, make sure to notify your treating physician. Nerve Damage: This is more common when the treatment is an invasive one, but it can also happen with the use of medications, such as those used in the treatment of cancer. The damage can occur to small secondary nerves, or to large primary ones, such as those in the spinal cord and brain. This damage may be temporary or permanent and it may lead to impairments that can range from temporary numbness to permanent paralysis and/or brain death. Allergic Reactions: Any time a substance or material comes in contact with our body, there is the possibility of an allergic reaction. These can range from a mild skin rash (contact dermatitis) to a severe systemic reaction (anaphylactic reaction), which can result in death. Death: In general, any medical intervention can result in death, most of the time due to an unforeseen complication. ____________________________________________________________________________________________    _______________________________________________________________________  Medication Rules  Purpose: To inform patients, and their family members, of our medication rules and regulations.  Applies to: All patients receiving prescriptions from our practice (written or electronic).  Pharmacy of record: This is the pharmacy where your electronic prescriptions will be sent. Make sure we  have the correct one.  Electronic prescriptions: In compliance with the [place] (STOP) Act of 2017 (Session Lanny Cramp [phone]), effective October 23, 2018, all controlled substances must be electronically prescribed. Written prescriptions, faxing, or calling prescriptions to a pharmacy will no longer be done.  Prescription refills: These will be provided only during in-person appointments. No medications will be renewed without a "face-to-face" evaluation with your provider. Applies to all prescriptions.  NOTE: The following applies primarily to controlled substances (Opioid* Pain Medications).   Type of encounter (visit): For patients receiving controlled substances, face-to-face visits are required. (Not an option and not up to the patient.)  Patient's responsibilities: Pain Pills: Bring all pain pills to every appointment (except for procedure appointments). Pill Bottles: Bring pills in original pharmacy bottle. Bring bottle, even if empty. Always bring the bottle of the most recent fill.  Medication refills: You are responsible for knowing and keeping track of what medications you are taking and when is it that you will need a refill. The day before your appointment: write a list of all prescriptions that need to be refilled. The day of the appointment: give the list to the admitting nurse. Prescriptions will be written only during appointments. No prescriptions will be written on procedure days. If you forget a medication: it will not be "Called in", "Faxed", or "electronically sent". You will need to get another appointment to get these prescribed. No early refills. Do not call asking to have your prescription filled early. Partial  or short prescriptions: Occasionally your pharmacy may not have enough pills to fill your prescription.  NEVER ACCEPT a partial fill or a prescription that is short of the total amount of pills that you were prescribed.  With controlled substances the law allows 72 hours for the pharmacy to complete the prescription.  If the prescription is not completed within 72 hours, the pharmacist will require a new prescription to be written. This means that you will be short on your medicine and we WILL NOT send another prescription to complete your original prescription.  Instead, request the pharmacy to send a carrier to a nearby branch to get enough medication to provide you with your full prescription. Prescription Accuracy: You are responsible for carefully inspecting your prescriptions before leaving our office. Have the discharge nurse carefully go over each prescription with you, before taking them home. Make sure that your name is accurately spelled, that your address is correct. Check the name and dose of your medication to make sure it is accurate. Check the number of pills, and the written instructions to make sure they are clear and accurate. Make sure that you are given enough medication to last until your next medication refill appointment. Taking Medication: Take medication as prescribed. When it comes to controlled substances, taking less pills or less frequently than prescribed is permitted and encouraged. Never take more pills than instructed. Never take the medication more frequently than prescribed.  Inform other Doctors: Always inform, all of your healthcare providers, of all the medications you take. Pain Medication from other Providers: You are not allowed to accept any additional pain medication from any other Doctor or Healthcare provider. There are two exceptions to this rule. (see below) In the event that you require additional pain medication, you are responsible for notifying us, as stated below. Cough Medicine: Often these contain an opioid, such as codeine or hydrocodone. Never accept or take cough medicine containing these opioids if you are already taking an opioid* medication. The combination may  cause respiratory failure and death. Medication Agreement: You are responsible for carefully reading and following our Medication Agreement. This must be signed before receiving any prescriptions from our practice. Safely store a copy of your signed Agreement. Violations to the Agreement will result in no further prescriptions. (Additional copies of our Medication Agreement are available upon request.) Laws, Rules, & Regulations: All patients are expected to follow all Federal and [employer], [employer], Rules, [employer]. Ignorance of the Laws does not constitute a valid excuse.  Illegal drugs and Controlled Substances: The use of illegal substances (including, but not limited to marijuana and its derivatives) and/or the illegal use of any controlled substances is strictly prohibited. Violation of this rule may result in the immediate and permanent discontinuation of any and all prescriptions being written by our practice. The use of any illegal substances is prohibited. Adopted CDC guidelines & recommendations: Target dosing levels will be at or below 60 MME/day. Use of benzodiazepines** is not recommended.  Exceptions: There are only two exceptions to the rule of not receiving pain medications from other Healthcare Providers. Exception #1 (Emergencies): In the event of an emergency (i.e.: accident requiring emergency care), you are allowed to receive additional pain medication. However, you are responsible for: As soon as you are able, call our office [phone], at any time of the day or night, and leave a message stating your name, the date and nature of the emergency, and the name and dose of the medication prescribed. In the event that your call is answered by a member of our staff, make sure to document and save the date, time, and the name of the person that took your information.  Exception #2 (Planned  Surgery): In the event that you are scheduled by another doctor or dentist to have any type  of surgery or procedure, you are allowed (for a period no longer than 30 days), to receive additional pain medication, for the acute post-op pain. However, in this case, you are responsible for picking up a copy of our "Post-op Pain Management for Surgeons" handout, and giving it to your surgeon or dentist. This document is available at our office, and does not require an appointment to obtain it. Simply go to our office during business hours (Monday-Thursday from 8:00 AM to 4:00 PM) (Friday 8:00 AM to 12:00 Noon) or if you have a scheduled appointment with [place], prior to your surgery, and ask for it by name. In addition, you are responsible for: calling our office [phone], at any time of the day or night, and leaving a message stating your name, name of your surgeon, type of surgery, and date of procedure or surgery. Failure to comply with your responsibilities may result in termination of therapy involving the controlled substances. Medication Agreement Violation. Following the above rules, including your responsibilities will help you in avoiding a Medication Agreement Violation ("Breaking your Pain Medication Contract").  Consequences:  Not following the above rules may result in permanent discontinuation of medication prescription therapy.  *Opioid medications include: morphine, codeine, oxycodone, oxymorphone, hydrocodone, hydromorphone, meperidine, tramadol, tapentadol, buprenorphine, fentanyl, methadone. **Benzodiazepine medications include: diazepam (Valium), alprazolam (Xanax), clonazepam (Klonopine), lorazepam (Ativan), clorazepate (Tranxene), chlordiazepoxide (Librium), estazolam (Prosom), oxazepam (Serax), temazepam (Restoril), triazolam (Halcion) (Last updated: 08/15/2022) ______________________________________________________________________    ______________________________________________________________________  Medication Recommendations and Reminders  Applies to: All patients  receiving prescriptions (written and/or electronic).  Medication Rules & Regulations: You are responsible for reading, knowing, and following our "Medication Rules" document. These exist for your safety and that of others. They are not flexible and neither are we. Dismissing or ignoring them is an act of "non-compliance" that may result in complete and irreversible termination of such medication therapy. For safety reasons, "non-compliance" will not be tolerated. As with the [place] fundamental legal principle of "ignorance of the law is no defense", we will accept no excuses for not having read and knowing the content of documents provided to you by our practice.  Pharmacy of record:  Definition: This is the pharmacy where your electronic prescriptions will be sent.  We do not endorse any particular pharmacy. It is up to you and your insurance to decide what pharmacy to use.  We do not restrict you in your choice of pharmacy. However, once we write for your prescriptions, we will NOT be re-sending more prescriptions to fix restricted supply problems created by your pharmacy, or your insurance.  The pharmacy listed in the electronic medical record should be the one where you want electronic prescriptions to be sent. If you choose to change pharmacy, simply notify our nursing staff. Changes will be made only during your regular appointments and not over the phone.  Recommendations: Keep all of your pain medications in a safe place, under lock and key, even if you live alone. We will NOT replace lost, stolen, or damaged medication. We do not accept "Police Reports" as proof of medications having been stolen. After you fill your prescription, take 1 week's worth of pills and put them away in a safe place. You should keep a separate, properly labeled bottle for this purpose. The remainder should be kept in the original bottle. Use this as your primary supply, until it runs out. Once  it's gone, then you know  that you have 1 week's worth of medicine, and it is time to come in for a prescription refill. If you do this correctly, it is unlikely that you will ever run out of medicine. To make sure that the above recommendation works, it is very important that you make sure your medication refill appointments are scheduled at least 1 week before you run out of medicine. To do this in an effective manner, make sure that you do not leave the office without scheduling your next medication management appointment. Always ask the nursing staff to show you in your prescription , when your medication will be running out. Then arrange for the receptionist to get you a return appointment, at least 7 days before you run out of medicine. Do not wait until you have 1 or 2 pills left, to come in. This is very poor planning and does not take into consideration that we may need to cancel appointments due to bad weather, sickness, or emergencies affecting our staff. DO NOT ACCEPT A "Partial Fill": If for any reason your pharmacy does not have enough pills/tablets to completely fill or refill your prescription, do not allow for a "partial fill". The law allows the pharmacy to complete that prescription within 72 hours, without requiring a new prescription. If they do not fill the rest of your prescription within those 72 hours, you will need a separate prescription to fill the remaining amount, which we will NOT provide. If the reason for the partial fill is your insurance, you will need to talk to the pharmacist about payment alternatives for the remaining tablets, but again, DO NOT ACCEPT A PARTIAL FILL, unless you can trust your pharmacist to obtain the remainder of the pills within 72 hours.  Prescription refills and/or changes in medication(s):  Prescription refills, and/or changes in dose or medication, will be conducted only during scheduled medication management appointments. (Applies to both, written and electronic  prescriptions.) No refills on procedure days. No medication will be changed or started on procedure days. No changes, adjustments, and/or refills will be conducted on a procedure day. Doing so will interfere with the diagnostic portion of the procedure. No phone refills. No medications will be "called into the pharmacy". No Fax refills. No weekend refills. No Holliday refills. No after hours refills.  Remember:  Business hours are:  Monday to Thursday 8:00 AM to 4:00 PM Provider's Schedule: Milinda Pointer, MD - Appointments are:  Medication management: Monday and Wednesday 8:00 AM to 4:00 PM Procedure day: Tuesday and Thursday 7:30 AM to 4:00 PM Gillis Santa, MD - Appointments are:  Medication management: Tuesday and Thursday 8:00 AM to 4:00 PM Procedure day: Monday and Wednesday 7:30 AM to 4:00 PM (Last update: 08/15/2022) ______________________________________________________________________    ____________________________________________________________________________________________  Pharmacy Shortages of Pain Medication   Introduction Shockingly as it may seem, .  "No U.[place] Supreme Court decision has ever interpreted the Constitution as guaranteeing a right to health care for all Americans." - https://huff.com/  "With respect to human rights, the [place] has no formally codified right to health, nor does it participate in a human rights treaty that specifies a right to health." - Scott J. Schweikart, JD, MBE  Situation By now, most of our patients have had the experience of being told by their pharmacist that they do not have enough medication to cover their prescription. If you have not had this experience, just know that you soon will.  Problem There appears  to be a shortage of these medications, either at the national level or locally. This is happening with all pharmacies. When there is not  enough medication, patients are offered a partial fill and they are told that they will try to get the rest of the medicine for them at a later time. If they do not have enough for even a partial fill, the pharmacists are telling the patients to call us (the prescribing physicians) to request that we send another prescription to another pharmacy to get the medicine.   This reordering of a controlled substance creates documentation problems where additional paperwork needs to be created to explain why two prescriptions for the same period of time and the same medicine are being prescribed to the same patient. It also creates situations where the last appointment note does not accurately reflect when and what prescriptions were given to a patient. This leads to prescribing errors down the line, in subsequent follow-up visits.   [employer] ([employer]) Research revealed that Surveyor, quantity .1806 (21 NCAC 46.1806) authorizes pharmacists to the transfer of prescriptions among pharmacies, and it sets forth procedural and recordkeeping requirements for doing so. However, this requires the pharmacist to complete the previously mentioned procedural paperwork to accomplish the transfer. As it turns out, it is much easier for them to have the prescribing physicians do the work.   Possible solutions 1. You can ask your physician to assist you in weaning yourself off these medications. 2. Ask your pharmacy if the medication is in stock, 3 days prior to your refill. 3. If you need a pharmacy change, let us know at your medication management visit. Prescriptions that have already been electronically sent to a pharmacy will not be re-sent to a different pharmacy if your pharmacy of record does not have it in stock. Proper stocking of medication is a pharmacy problem, not a prescriber problem. Work with your pharmacist to solve the problem. 4. Have the [employer] [facility] [employer] [employer] Assembly add a provision  to the "STOP ACT" (the law that mandates how controlled substances are prescribed) where there is an exception to the electronic prescribing rule that states that in the event there are shortages of medications the physicians are allowed to use written prescriptions as opposed to electronic ones. This would allow patients to take their prescriptions to a different pharmacy that may have enough medication available to fill the prescription. The problem is that currently there is a law that does not allow for written prescriptions, with the exception of instances where the electronic medical record is down due to technical issues.  5. Have [place] Congress ease the pressure on pharmaceutical companies, allowing them to produce enough quantities of the medication to adequately supply the population. 6. Have pharmacies keep enough stocks of these medications to cover their client base.  7. Have the [facility] Assembly add a provision to the "STOP ACT" where they ease the regulations surrounding the transfer of controlled substances between pharmacies, so as to simplify the transfer of supplies. As an alternative, develop a system to allow patients to obtain the remainder of their prescription at another one of their pharmacies or at an associate pharmacy.   How this shortage will affect you.  Understand that this is a pharmacy supply problem, not a prescriber problem. Work with your pharmacy to solve it. The job of the prescriber is to evaluate and monitor the patient for the appropriate indications and use of these medicines.  It is not the job of the prescriber to supply the medication or to solve problems with that supply. The responsibility and the choice to obtain the medication resides on the patient. By law, supplying the medication is the job of the pharmacy. It is certainly not the job of the prescriber to solve supply problems.   Due to the above problems we are no longer taking patients to write  for their pain medication. Future discussions with your physician may include potentially weaning medications or transitioning to alternatives.  We will be focusing primarily on interventional based pain management. We will continue to evaluate for appropriate indications and we may provide recommendations regarding medication, dose, and schedule, as well as monitoring recommendations, however, we will not be taking over the actual prescribing of these substances. On those patients where we are treating their chronic pain with interventional therapies, exceptions will be considered on a case by case basis. At this time, we will try to continue providing this supplemental service to those patients we have been managing in the past. However, as of August 1st, 2023, we no longer will be sending additional prescriptions to other pharmacies for the purpose of solving their supply problems. Once we send a prescription to a pharmacy, we will not be resending it again to another pharmacy to cover for their shortages.   What to do. Write as many letters as you can. Recruit the help of family members in writing these letters. Below are some of the places where you can write to make your voice heard. Let them know what the problem is and push them to look for solutions.   Search internet for: "[employer] find your legislators" NoseSwap.is  Search internet for: "[employer] commissioner complaints" Starlas.fi  Search internet for: "[place] complaints" https://www.hernandez-brewer.com/.htm  Search internet for: "[facility] pharmacy complaints" Email [facility] Customer [facility] woondaal.com.jsp?callType=store  Search internet for: Programme researcher, broadcasting/film/video customer service  complaints" https://www.[facility].com/topic/marketing/[email or web address]_customerservice.jsp  ____________________________________________________________________________________________     ____________________________________________________________________________________________  Drug Holidays (Slow)  What is a "Drug Holiday"? Drug Holiday: is the name given to the period of time during which a patient stops taking a medication(s) for the purpose of eliminating tolerance to the drug.  Benefits Improved effectiveness of opioids. Decreased opioid dose needed to achieve benefits. Improved pain with lesser dose.  What is tolerance? Tolerance: is the progressive decreased in effectiveness of a drug due to its repetitive use. With repetitive use, the body gets use to the medication and as a consequence, it loses its effectiveness. This is a common problem seen with opioid pain medications. As a result, a larger dose of the drug is needed to achieve the same effect that used to be obtained with a smaller dose.  How long should a "Drug Holiday" last? You should stay off of the pain medicine for at least 14 consecutive days. (2 weeks)  Should I stop the medicine "cold [place]"? No. You should always coordinate with your Pain Specialist so that he/she can provide you with the correct medication dose to make the transition as smoothly as possible.  How do I stop the medicine? Slowly. You will be instructed to decrease the daily amount of pills that you take by one (1) pill every seven (7) days. This is called a "slow downward taper" of your dose. For example: if you normally take four (4) pills per day, you will be asked to drop this dose to three (3) pills per day for seven (7)  days, then to two (2) pills per day for seven (7) days, then to one (1) per day for seven (7) days, and at the end of those last seven (7) days, this is when the "Drug Holiday" would start.   Will I have  withdrawals? By doing a "slow downward taper" like this one, it is unlikely that you will experience any significant withdrawal symptoms. Typically, what triggers withdrawals is the sudden stop of a high dose opioid therapy. Withdrawals can usually be avoided by slowly decreasing the dose over a prolonged period of time. If you do not follow these instructions and decide to stop your medication abruptly, withdrawals may be possible.  What are withdrawals? Withdrawals: refers to the wide range of symptoms that occur after stopping or dramatically reducing opiate drugs after heavy and prolonged use. Withdrawal symptoms do not occur to patients that use low dose opioids, or those who take the medication sporadically. Contrary to benzodiazepine (example: Valium, Xanax, etc.) or alcohol withdrawals ("Delirium Tremens"), opioid withdrawals are not lethal. Withdrawals are the physical manifestation of the body getting rid of the excess receptors.  Expected Symptoms Early symptoms of withdrawal may include: Agitation Anxiety Muscle aches Increased tearing Insomnia Runny nose Sweating Yawning  Late symptoms of withdrawal may include: Abdominal cramping Diarrhea Dilated pupils Goose bumps Nausea Vomiting  Will I experience withdrawals? Due to the slow nature of the taper, it is very unlikely that you will experience any.  What is a slow taper? Taper: refers to the gradual decrease in dose.  (Last update: 05/12/2020) ____________________________________________________________________________________________    ____________________________________________________________________________________________  CBD (cannabidiol) & Delta-8 (Delta-8 tetrahydrocannabinol) WARNING  Intro: Cannabidiol (CBD) and tetrahydrocannabinol (THC), are two natural compounds found in plants of the Cannabis genus. They can both be extracted from hemp or cannabis. Hemp and cannabis come from the Cannabis sativa  plant. Both compounds interact with your body's endocannabinoid system, but they have very different effects. CBD does not produce the high sensation associated with cannabis. Delta-8 tetrahydrocannabinol, also known as delta-8 THC, is a psychoactive substance found in the Cannabis sativa plant, of which marijuana and hemp are two varieties. THC is responsible for the high associated with the illicit use of marijuana.  Applicable to: All individuals currently taking or considering taking CBD (cannabidiol) and, more important, all patients taking opioid analgesic controlled substances (pain medication). (Example: oxycodone; oxymorphone; hydrocodone; hydromorphone; morphine; methadone; tramadol; tapentadol; fentanyl; buprenorphine; butorphanol; dextromethorphan; meperidine; codeine; etc.)  Legal status: CBD remains a Schedule I drug prohibited for any use. CBD is illegal with one exception. In the [place], CBD has a limited Transport planner (FDA) approval for the treatment of two specific types of epilepsy disorders. Only one CBD product has been approved by the FDA for this purpose: "Epidiolex". FDA is aware that some companies are marketing products containing cannabis and cannabis-derived compounds in ways that violate the [employer], Drug and Cosmetic Act [facility] Act) and that may put the health and safety of consumers at risk. The FDA, a Federal agency, has not enforced the CBD status since 2018. UPDATE: (12/09/2021) The Drug Enforcement Agency ([place]) issued a letter stating that "delta" cannabinoids, including Delta-8-THCO and Delta-9-THCO, synthetically derived from hemp do not qualify as hemp and will be viewed as Schedule I drugs. (Schedule I drugs, substances, or chemicals are defined as drugs with no currently accepted medical use and a high potential for abuse. Some examples of Schedule I drugs are: heroin, lysergic acid diethylamide (LSD), marijuana (cannabis),  3,4-methylenedioxymethamphetamine (ecstasy), methaqualone, and peyote.) (https://jennings.com/)  Legality: Some manufacturers ship CBD products nationally, which is illegal. Often such products are sold online and are therefore available throughout the country. CBD is openly sold in head shops and health food stores in some states where such sales have not been explicitly legalized. Selling unapproved products with unsubstantiated therapeutic claims is not only a violation of the law, but also can put patients at risk, as these products have not been proven to be safe or effective. Federal illegality makes it difficult to conduct research on CBD.  Reference: "FDA Regulation of Cannabis and Cannabis-Derived Products, Including Cannabidiol (CBD)" - SeekArtists.com.pt  Warning: CBD is not FDA approved and has not undergo the same manufacturing controls as prescription drugs.  This means that the purity and safety of available CBD may be questionable. Most of the time, despite manufacturer's claims, it is contaminated with THC (delta-9-tetrahydrocannabinol - the chemical in marijuana responsible for the "HIGH").  When this is the case, the [facility] contaminant will trigger a positive urine drug screen (UDS) test for Marijuana (carboxy-THC). Because a positive UDS for any illicit substance is a violation of our medication agreement, your opioid analgesics (pain medicine) may be permanently discontinued. The FDA recently put out a warning about 5 things that everyone should be aware of regarding Delta-8 THC: Delta-8 THC products have not been evaluated or approved by the FDA for safe use and may be marketed in ways that put the public health at risk. The FDA has received adverse event reports involving delta-8 THC-containing products. Delta-8 THC has psychoactive and intoxicating effects. Delta-8 THC  manufacturing often involve use of potentially harmful chemicals to create the concentrations of delta-8 THC claimed in the marketplace. The final delta-8 THC product may have potentially harmful by-products (contaminants) due to the chemicals used in the process. Manufacturing of delta-8 THC products may occur in uncontrolled or unsanitary settings, which may lead to the presence of unsafe contaminants or other potentially harmful substances. Delta-8 THC products should be kept out of the reach of children and pets.  MORE ABOUT CBD  General Information: CBD was discovered in [birth year] and it is a derivative of the cannabis sativa genus plants (Marijuana and Hemp). It is one of the 113 identified substances found in Marijuana. It accounts for up to 40% of the plant's extract. As of 2018, preliminary clinical studies on CBD included research for the treatment of anxiety, movement disorders, and pain. CBD is available and consumed in multiple forms, including inhalation of smoke or vapor, as an aerosol spray, and by mouth. It may be supplied as an oil containing CBD, capsules, dried cannabis, or as a liquid solution. CBD is thought not to be as psychoactive as THC (delta-9-tetrahydrocannabinol - the chemical in marijuana responsible for the "HIGH"). Studies suggest that CBD may interact with different biological target receptors in the body, including cannabinoid and other neurotransmitter receptors. As of 2018 the mechanism of action for its biological effects has not been determined.  Side-effects  Adverse reactions: Dry mouth, diarrhea, decreased appetite, fatigue, drowsiness, malaise, weakness, sleep disturbances, and others.  Drug interactions: CBC may interact with other medications such as blood-thinners. Because CBD causes drowsiness on its own, it also increases the drowsiness caused by other medications, including antihistamines (such as Benadryl), benzodiazepines (Xanax, Ativan, Valium),  antipsychotics, antidepressants and opioids, as well as alcohol and supplements such as kava, melatonin and St. John's Wort. Be cautious with the following combinations:   Brivaracetam (Briviact) Brivaracetam is  changed and broken down by the body. CBD might decrease how quickly the body breaks down brivaracetam. This might increase levels of brivaracetam in the body.  Caffeine Caffeine is changed and broken down by the body. CBD might decrease how quickly the body breaks down caffeine. This might increase levels of caffeine in the body.  Carbamazepine (Tegretol) Carbamazepine is changed and broken down by the body. CBD might decrease how quickly the body breaks down carbamazepine. This might increase levels of carbamazepine in the body and increase its side effects.  Citalopram (Celexa) Citalopram is changed and broken down by the body. CBD might decrease how quickly the body breaks down citalopram. This might increase levels of citalopram in the body and increase its side effects.  Clobazam (Onfi) Clobazam is changed and broken down by the liver. CBD might decrease how quickly the liver breaks down clobazam. This might increase the effects and side effects of clobazam.  Eslicarbazepine (Aptiom) Eslicarbazepine is changed and broken down by the body. CBD might decrease how quickly the body breaks down eslicarbazepine. This might increase levels of eslicarbazepine in the body by a small amount.  Everolimus (Zostress) Everolimus is changed and broken down by the body. CBD might decrease how quickly the body breaks down everolimus. This might increase levels of everolimus in the body.  Lithium Taking higher doses of CBD might increase levels of lithium. This can increase the risk of lithium toxicity.  Medications changed by the liver (Cytochrome P450 1A1 (CYP1A1) substrates) Some medications are changed and broken down by the liver. CBD might change how quickly the liver breaks down these  medications. This could change the effects and side effects of these medications.  Medications changed by the liver (Cytochrome P450 1A2 (CYP1A2) substrates) Some medications are changed and broken down by the liver. CBD might change how quickly the liver breaks down these medications. This could change the effects and side effects of these medications.  Medications changed by the liver (Cytochrome P450 1B1 (CYP1B1) substrates) Some medications are changed and broken down by the liver. CBD might change how quickly the liver breaks down these medications. This could change the effects and side effects of these medications.  Medications changed by the liver (Cytochrome P450 2A6 (CYP2A6) substrates) Some medications are changed and broken down by the liver. CBD might change how quickly the liver breaks down these medications. This could change the effects and side effects of these medications.  Medications changed by the liver (Cytochrome P450 2B6 (CYP2B6) substrates) Some medications are changed and broken down by the liver. CBD might change how quickly the liver breaks down these medications. This could change the effects and side effects of these medications.  Medications changed by the liver (Cytochrome P450 2C19 (CYP2C19) substrates) Some medications are changed and broken down by the liver. CBD might change how quickly the liver breaks down these medications. This could change the effects and side effects of these medications.  Medications changed by the liver (Cytochrome P450 2C8 (CYP2C8) substrates) Some medications are changed and broken down by the liver. CBD might change how quickly the liver breaks down these medications. This could change the effects and side effects of these medications.  Medications changed by the liver (Cytochrome P450 2C9 (CYP2C9) substrates) Some medications are changed and broken down by the liver. CBD might change how quickly the liver breaks down these  medications. This could change the effects and side effects of these medications.  Medications changed by the liver (Cytochrome P450  2D6 (CYP2D6) substrates) Some medications are changed and broken down by the liver. CBD might change how quickly the liver breaks down these medications. This could change the effects and side effects of these medications.  Medications changed by the liver (Cytochrome P450 2E1 (CYP2E1) substrates) Some medications are changed and broken down by the liver. CBD might change how quickly the liver breaks down these medications. This could change the effects and side effects of these medications.  Medications changed by the liver (Cytochrome P450 3A4 (CYP3A4) substrates) Some medications are changed and broken down by the liver. CBD might change how quickly the liver breaks down these medications. This could change the effects and side effects of these medications.  Medications changed by the liver (Glucuronidated drugs) Some medications are changed and broken down by the liver. CBD might change how quickly the liver breaks down these medications. This could change the effects and side effects of these medications.  Medications that decrease the breakdown of other medications by the liver (Cytochrome P450 2C19 (CYP2C19) inhibitors) CBD is changed and broken down by the liver. Some drugs decrease how quickly the liver changes and breaks down CBD. This could change the effects and side effects of CBD.  Medications that decrease the breakdown of other medications in the liver (Cytochrome P450 3A4 (CYP3A4) inhibitors) CBD is changed and broken down by the liver. Some drugs decrease how quickly the liver changes and breaks down CBD. This could change the effects and side effects of CBD.  Medications that increase breakdown of other medications by the liver (Cytochrome P450 3A4 (CYP3A4) inducers) CBD is changed and broken down by the liver. Some drugs increase how quickly the  liver changes and breaks down CBD. This could change the effects and side effects of CBD.  Medications that increase the breakdown of other medications by the liver (Cytochrome P450 2C19 (CYP2C19) inducers) CBD is changed and broken down by the liver. Some drugs increase how quickly the liver changes and breaks down CBD. This could change the effects and side effects of CBD.  Methadone (Dolophine) Methadone is broken down by the liver. CBD might decrease how quickly the liver breaks down methadone. Taking cannabidiol along with methadone might increase the effects and side effects of methadone.  Rufinamide (Banzel) Rufinamide is changed and broken down by the body. CBD might decrease how quickly the body breaks down rufinamide. This might increase levels of rufinamide in the body by a small amount.  Sedative medications (CNS depressants) CBD might cause sleepiness and slowed breathing. Some medications, called sedatives, can also cause sleepiness and slowed breathing. Taking CBD with sedative medications might cause breathing problems and/or too much sleepiness.  Sirolimus (Rapamune) Sirolimus is changed and broken down by the body. CBD might decrease how quickly the body breaks down sirolimus. This might increase levels of sirolimus in the body.  Stiripentol (Diacomit) Stiripentol is changed and broken down by the body. CBD might decrease how quickly the body breaks down stiripentol. This might increase levels of stiripentol in the body and increase its side effects.  Tacrolimus (Prograf) Tacrolimus is changed and broken down by the body. CBD might decrease how quickly the body breaks down tacrolimus. This might increase levels of tacrolimus in the body.  Tamoxifen (Soltamox) Tamoxifen is changed and broken down by the body. CBD might affect how quickly the body breaks down tamoxifen. This might affect levels of tamoxifen in the body.  Topiramate (Topamax) Topiramate is changed and broken  down by the body.  CBD might decrease how quickly the body breaks down topiramate. This might increase levels of topiramate in the body by a small amount.  Valproate Valproic acid can cause liver injury. Taking cannabidiol with valproic acid might increase the chance of liver injury. CBD and/or valproic acid might need to be stopped, or the dose might need to be reduced.  Warfarin (Coumadin) CBD might increase levels of warfarin, which can increase the risk for bleeding. CBD and/or warfarin might need to be stopped, or the dose might need to be reduced.  Zonisamide Zonisamide is changed and broken down by the body. CBD might decrease how quickly the body breaks down zonisamide. This might increase levels of zonisamide in the body by a small amount. (Last update: 12/21/2021) ____________________________________________________________________________________________    ____________________________________________________________________________________________  Patient Information update  To: All of our patients.  Re: Name change.  It has been made official that our current name, "[facility]"   will soon be changed to "[facility]".   The purpose of this change is to eliminate any confusion created by the concept of our practice being a "Medication Management Pain [facility]". In the past this has led to the misconception that we treat pain primarily by the use of prescription medications.  Nothing can be farther from the truth.   Understanding PAIN MANAGEMENT: To further understand what our practice does, you first have to understand that "Pain Management" is a subspecialty that requires additional training once a physician has completed their specialty training, which can be in either Anesthesia, Neurology, Psychiatry, or Physical Medicine and Rehabilitation (PMR). Each one of these  contributes to the final approach taken by each physician to the management of their patient's pain. To be a "Pain Management Specialist" you must have first completed one of the specialty trainings below.  Anesthesiologists - trained in clinical pharmacology and interventional techniques such as nerve blockade and regional as well as central neuroanatomy. They are trained to block pain before, during, and after surgical interventions.  Neurologists - trained in the diagnosis and pharmacological treatment of complex neurological conditions, such as Multiple Sclerosis, Parkinson's, spinal cord injuries, and other systemic conditions that may be associated with symptoms that may include but are not limited to pain. They tend to rely primarily on the treatment of chronic pain using prescription medications.  Psychiatrist - trained in conditions affecting the psychosocial wellbeing of patients including but not limited to depression, anxiety, schizophrenia, personality disorders, addiction, and other substance use disorders that may be associated with chronic pain. They tend to rely primarily on the treatment of chronic pain using prescription medications.   Physical Medicine and Rehabilitation (PMR) physicians, also known as physiatrists - trained to treat a wide variety of medical conditions affecting the brain, spinal cord, nerves, bones, joints, ligaments, muscles, and tendons. Their training is primarily aimed at treating patients that have suffered injuries that have caused severe physical impairment. Their training is primarily aimed at the physical therapy and rehabilitation of those patients. They may also work alongside orthopedic surgeons or neurosurgeons using their expertise in assisting surgical patients to recover after their surgeries.  INTERVENTIONAL PAIN MANAGEMENT is sub-subspecialty of Pain Management.  Our physicians are Board-certified in Anesthesia, Pain Management, and Interventional  Pain Management.  This meaning that not only have they been trained and Board-certified in their specialty of Anesthesia, and subspecialty of Pain Management, but they have also received further training in the sub-subspecialty  of Interventional Pain Management, in order to become Board-certified as INTERVENTIONAL PAIN MANAGEMENT SPECIALIST.    Mission: Our goal is to use our skills in  [place] as alternatives to the chronic use of prescription opioid medications for the treatment of pain. To make this more clear, we have changed our name to reflect what we do and offer. We will continue to offer medication management assessment and recommendations, but we will not be taking over any patient's medication management.  ____________________________________________________________________________________________

## 2022-09-16 NOTE — Progress Notes (Signed)
PROVIDER NOTE: Interpretation of information contained herein should be left to medically-trained personnel. Specific patient instructions are provided elsewhere under "Patient Instructions" section of medical record. This document was created in part using STT-dictation technology, any transcriptional errors that may result from this process are unintentional.  Patient: Tricia Berry Type: Established DOB: [birth year] MRN: [id number] PCP: Burnard Hawthorne, FNP  Service: Procedure DOS: 09/19/2022 Setting: Ambulatory Location: Ambulatory outpatient facility Delivery: Face-to-face Provider: Gaspar Cola, MD Specialty: Interventional Pain Management Specialty designation: 09 Location: Outpatient facility Ref. Prov.: Burnard Hawthorne, FNP    Procedure:           Type: Lumbar Facet, Medial Branch Block(s) #1  Laterality: Bilateral  Level: L3, L4, L5, & S1 Medial Branch Level(s). Injecting these levels blocks the L4-5 and L5-S1 lumbar facet joints. Imaging: Fluoroscopic guidance         Anesthesia: Local anesthesia (1-2% Lidocaine) Anxiolysis: IV Versed 3.0 mg Sedation: Moderate Sedation Fentanyl 1 mL (50 mcg) DOS: 09/19/2022 Performed by: Gaspar Cola, MD  Primary Purpose: Diagnostic/Therapeutic Indications: Low back pain severe enough to impact quality of life or function. 1. Chronic low back pain (1ry area of Pain) (Bilateral) (R>L) w/o sciatica   2. Lumbosacral facet syndrome (Bilateral)   3. 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 Lumbosacral facet arthropathy (Multilevel) (Bilateral)   5. Chronic lower extremity pain (3ry area of Pain) (Bilateral)    NAS-11 Pain score:   Pre-procedure: 4 /10   Post-procedure: 0-No pain/10     Position / Prep / Materials:  Position: Prone  Prep solution: DuraPrep (Iodine Povacrylex [0.7% available iodine] and Isopropyl Alcohol, 74% w/w) Area Prepped: Posterolateral Lumbosacral  Spine (Wide prep: From the lower border of the scapula down to the end of the tailbone and from flank to flank.)  Materials:  Tray: Block Needle(s):  Type: Spinal  Gauge (G): 22  Length: 3.5-in Qty: 4     Pre-op H&P Assessment:  Tricia Berry is a 51 y.o. (year old), female patient, seen today for interventional treatment. She  has a past surgical history that includes Abdominal hysterectomy; Breast biopsy (Left, 09/15/2020); Breast biopsy (Left, 09/15/2020); IR IMAGING GUIDED PORT INSERTION (10/01/2020); Modified mastectomy (Left, 03/17/2021); Total mastectomy (Right, 03/17/2021); PORTA CATH REMOVAL (Right, 03/17/2021); Breast reconstruction with placement of tissue expander and flex hd (acellular hydrated dermis) (Bilateral, 03/17/2021); Removal of bilateral tissue expanders with placement of bilateral breast implants (Bilateral, 05/09/2021); and Colonoscopy with propofol (N/A, 11/21/2021). Tricia Berry has a current medication list which includes the following prescription(s): albuterol, anastrozole, baclofen, budeson-glycopyrrol-formoterol, cyanocobalamin, fluoxetine, gentamicin ointment, lactobacillus, lorazepam, multiple vitamins-minerals, naloxone, nicotine, oxycodone, pregabalin, and trimethoprim, and the following Facility-Administered Medications: acetaminophen, diphenhydramine, fentanyl, goserelin, heparin lock flush, and zoledronic acid. Her primarily concern today is the Back Pain  Initial Vital Signs:  Pulse/HCG Rate: 73ECG Heart Rate: 63 (NSR) Temp: (!) 97.1 F (36.2 C) Resp: 16 BP: 121/84 SpO2: 97 %  BMI: Estimated body mass index is 22.46 kg/m as calculated from the following:   Height as of this encounter: 5' (1.524 m).   Weight as of this encounter: 115 lb (52.2 kg).  Risk Assessment: Allergies: Reviewed. She is allergic to morphine, sertraline hcl, sulfa antibiotics, sulfamethoxazole, and sulfonamide derivatives.  Allergy Precautions: None required Coagulopathies: Reviewed.  None identified.  Blood-thinner therapy: None at this time Active Infection(s): Reviewed. None identified. Tricia Berry is afebrile  Site Confirmation: Tricia Berry was asked to confirm the procedure and laterality  before marking the site Procedure checklist: Completed Consent: Before the procedure and under the influence of no sedative(s), amnesic(s), or anxiolytics, the patient was informed of the treatment options, risks and possible complications. To fulfill our ethical and legal obligations, as recommended by the American Medical Association's Code of Ethics, I have informed the patient of my clinical impression; the nature and purpose of the treatment or procedure; the risks, benefits, and possible complications of the intervention; the alternatives, including doing nothing; the risk(s) and benefit(s) of the alternative treatment(s) or procedure(s); and the risk(s) and benefit(s) of doing nothing. The patient was provided information about the general risks and possible complications associated with the procedure. These may include, but are not limited to: failure to achieve desired goals, infection, bleeding, organ or nerve damage, allergic reactions, paralysis, and death. In addition, the patient was informed of those risks and complications associated to Spine-related procedures, such as failure to decrease pain; infection (i.e.: Meningitis, epidural or intraspinal abscess); bleeding (i.e.: epidural hematoma, subarachnoid hemorrhage, or any other type of intraspinal or peri-dural bleeding); organ or nerve damage (i.e.: Any type of peripheral nerve, nerve root, or spinal cord injury) with subsequent damage to sensory, motor, and/or autonomic systems, resulting in permanent pain, numbness, and/or weakness of one or several areas of the body; allergic reactions; (i.e.: anaphylactic reaction); and/or death. Furthermore, the patient was informed of those risks and complications associated with the  medications. These include, but are not limited to: allergic reactions (i.e.: anaphylactic or anaphylactoid reaction(s)); adrenal axis suppression; blood sugar elevation that in diabetics may result in ketoacidosis or comma; water retention that in patients with history of congestive heart failure may result in shortness of breath, pulmonary edema, and decompensation with resultant heart failure; weight gain; swelling or edema; medication-induced neural toxicity; particulate matter embolism and blood vessel occlusion with resultant organ, and/or nervous system infarction; and/or aseptic necrosis of one or more joints. Finally, the patient was informed that Medicine is not an exact science; therefore, there is also the possibility of unforeseen or unpredictable risks and/or possible complications that may result in a catastrophic outcome. The patient indicated having understood very clearly. We have given the patient no guarantees and we have made no promises. Enough time was given to the patient to ask questions, all of which were answered to the patient's satisfaction. Ms. Bendavid has indicated that she wanted to continue with the procedure. Attestation: I, the ordering provider, attest that I have discussed with the patient the benefits, risks, side-effects, alternatives, likelihood of achieving goals, and potential problems during recovery for the procedure that I have provided informed consent. Date  Time: 09/19/2022  8:00 AM  Pre-Procedure Preparation:  Monitoring: As per clinic protocol. Respiration, ETCO2, SpO2, BP, heart rate and rhythm monitor placed and checked for adequate function Safety Precautions: Patient was assessed for positional comfort and pressure points before starting the procedure. Time-out: I initiated and conducted the "Time-out" before starting the procedure, as per protocol. The patient was asked to participate by confirming the accuracy of the "Time Out" information.  Verification of the correct person, site, and procedure were performed and confirmed by me, the nursing staff, and the patient. "Time-out" conducted as per Joint Commission's Universal Protocol (UP.01.01.01). Time: 3818  Description of Procedure:          Laterality: Bilateral. The procedure was performed in identical fashion on both sides. Targeted Levels: L3, L4, L5, & S1 Medial Branch Level(s)  Safety Precautions: Aspiration looking for blood return was  conducted prior to all injections. At no point did we inject any substances, as a needle was being advanced. Before injecting, the patient was told to immediately notify me if she was experiencing any new onset of "ringing in the ears, or metallic taste in the mouth". No attempts were made at seeking any paresthesias. Safe injection practices and needle disposal techniques used. Medications properly checked for expiration dates. SDV (single dose vial) medications used. After the completion of the procedure, all disposable equipment used was discarded in the proper designated medical waste containers. Local Anesthesia: Protocol guidelines were followed. The patient was positioned over the fluoroscopy table. The area was prepped in the usual manner. The time-out was completed. The target area was identified using fluoroscopy. A 12-in long, straight, sterile hemostat was used with fluoroscopic guidance to locate the targets for each level blocked. Once located, the skin was marked with an approved surgical skin marker. Once all sites were marked, the skin (epidermis, dermis, and hypodermis), as well as deeper tissues (fat, connective tissue and muscle) were infiltrated with a small amount of a short-acting local anesthetic, loaded on a 10cc syringe with a 25G, 1.5-in  Needle. An appropriate amount of time was allowed for local anesthetics to take effect before proceeding to the next step. Local Anesthetic: Lidocaine 2.0% The unused portion of the local  anesthetic was discarded in the proper designated containers. Technical description of process:   L3 Medial Branch Nerve Block (MBB): The target area for the L3 medial branch is at the junction of the postero-lateral aspect of the superior articular process and the superior, posterior, and medial edge of the transverse process of L4. Under fluoroscopic guidance, a Quincke needle was inserted until contact was made with os over the superior postero-lateral aspect of the pedicular shadow (target area). After negative aspiration for blood, 0.5 mL of the nerve block solution was injected without difficulty or complication. The needle was removed intact. L4 Medial Branch Nerve Block (MBB): The target area for the L4 medial branch is at the junction of the postero-lateral aspect of the superior articular process and the superior, posterior, and medial edge of the transverse process of L5. Under fluoroscopic guidance, a Quincke needle was inserted until contact was made with os over the superior postero-lateral aspect of the pedicular shadow (target area). After negative aspiration for blood, 0.5 mL of the nerve block solution was injected without difficulty or complication. The needle was removed intact. L5 Medial Branch Nerve Block (MBB): The target area for the L5 medial branch is at the junction of the postero-lateral aspect of the superior articular process and the superior, posterior, and medial edge of the sacral ala. Under fluoroscopic guidance, a Quincke needle was inserted until contact was made with os over the superior postero-lateral aspect of the pedicular shadow (target area). After negative aspiration for blood, 0.5 mL of the nerve block solution was injected without difficulty or complication. The needle was removed intact. S1 Medial Branch Nerve Block (MBB): The target area for the S1 medial branch is at the posterior and inferior 6 o'clock position of the L5-S1 facet joint. Under fluoroscopic  guidance, the Quincke needle inserted for the L5 MBB was redirected until contact was made with os over the inferior and postero aspect of the sacrum, at the 6 o' clock position under the L5-S1 facet joint (Target area). After negative aspiration for blood, 0.5 mL of the nerve block solution was injected without difficulty or complication. The needle was removed intact.  Once the entire procedure was completed, the treated area was cleaned, making sure to leave some of the prepping solution back to take advantage of its long term bactericidal properties.         Illustration of the posterior view of the lumbar spine and the posterior neural structures. Laminae of L2 through S1 are labeled. DPRL5, dorsal primary ramus of L5; DPRS1, dorsal primary ramus of S1; DPR3, dorsal primary ramus of L3; FJ, facet (zygapophyseal) joint L3-L4; I, inferior articular process of L4; LB1, lateral branch of dorsal primary ramus of L1; IAB, inferior articular branches from L3 medial branch (supplies L4-L5 facet joint); IBP, intermediate branch plexus; MB3, medial branch of dorsal primary ramus of L3; NR3, third lumbar nerve root; S, superior articular process of L5; SAB, superior articular branches from L4 (supplies L4-5 facet joint also); TP3, transverse process of L3.  Vitals:   09/19/22 0847 09/19/22 0854 09/19/22 0904 09/19/22 0914  BP: (!) 99/37 (!) 143/78 137/77 (!) 141/80  Pulse:      Resp: 16 16 16 16   Temp:  (!) 97 F (36.1 C)  (!) 97 F (36.1 C)  SpO2: 100% 95% 95% 97%  Weight:      Height:         Start Time: 0835 hrs. End Time: 0846 hrs.  Imaging Guidance (Spinal):          Type of Imaging Technique: Fluoroscopy Guidance (Spinal) Indication(s): Assistance in needle guidance and placement for procedures requiring needle placement in or near specific anatomical locations not easily accessible without such assistance. Exposure Time: Please see nurses notes. Contrast: None used. Fluoroscopic  Guidance: I was personally present during the use of fluoroscopy. "Tunnel Vision Technique" used to obtain the best possible view of the target area. Parallax error corrected before commencing the procedure. "Direction-depth-direction" technique used to introduce the needle under continuous pulsed fluoroscopy. Once target was reached, antero-posterior, oblique, and lateral fluoroscopic projection used confirm needle placement in all planes. Images permanently stored in EMR. Interpretation: No contrast injected. I personally interpreted the imaging intraoperatively. Adequate needle placement confirmed in multiple planes. Permanent images saved into the patient's record.  Antibiotic Prophylaxis:   Anti-infectives (From admission, onward)    None      Indication(s): None identified  Post-operative Assessment:  Post-procedure Vital Signs:  Pulse/HCG Rate: 7370 Temp: (!) 97 F (36.1 C) Resp: 16 BP: (!) 141/80 SpO2: 97 %  EBL: None  Complications: No immediate post-treatment complications observed by team, or reported by patient.  Note: The patient tolerated the entire procedure well. A repeat set of vitals were taken after the procedure and the patient was kept under observation following institutional policy, for this type of procedure. Post-procedural neurological assessment was performed, showing return to baseline, prior to discharge. The patient was provided with post-procedure discharge instructions, including a section on how to identify potential problems. Should any problems arise concerning this procedure, the patient was given instructions to immediately contact us, at any time, without hesitation. In any case, we plan to contact the patient by telephone for a follow-up status report regarding this interventional procedure.  Comments:  No additional relevant information.  Plan of Care  Orders:  Orders Placed This Encounter  Procedures   LUMBAR FACET(MEDIAL BRANCH NERVE BLOCK)  MBNB    Scheduling Instructions:     Procedure: Lumbar facet block (AKA.: Lumbosacral medial branch nerve block)     Side: Bilateral     Level: L3-4 & L5-S1 Facets (L2, L3,  L4, L5, & S1 Medial Branch Nerves)     Sedation: Patient's choice.     Timeframe: Today    Order Specific Question:   Where will this procedure be performed?    Answer:   [facility]   DG PAIN [facility] C-ARM 1-60 MIN NO REPORT    Intraoperative interpretation by procedural physician at [place].    Standing Status:   Standing    Number of Occurrences:   1    Order Specific Question:   Reason for exam:    Answer:   Assistance in needle guidance and placement for procedures requiring needle placement in or near specific anatomical locations not easily accessible without such assistance.   Informed Consent Details: Physician/Practitioner Attestation; Transcribe to consent form and obtain patient signature    Nursing Order: Transcribe to consent form and obtain patient signature. Note: Always confirm laterality of pain with Ms. Sciuto, before procedure.    Order Specific Question:   Physician/Practitioner attestation of informed consent for procedure/surgical case    Answer:   I, the physician/practitioner, attest that I have discussed with the patient the benefits, risks, side effects, alternatives, likelihood of achieving goals and potential problems during recovery for the procedure that I have provided informed consent.    Order Specific Question:   Procedure    Answer:   Lumbar Facet Block  under fluoroscopic guidance    Order Specific Question:   Physician/Practitioner performing the procedure    Answer:   Fritz Cauthon A. Dossie Arbour MD    Order Specific Question:   Indication/Reason    Answer:   Low Back Pain, with our without leg pain, due to Facet Joint Arthralgia (Joint Pain) Spondylosis (Arthritis of the Spine), without myelopathy or radiculopathy (Nerve Damage).   Provide equipment / supplies at  bedside    "Block Tray" (Disposable  single use) Skin infiltration needle: Regular - 1.5-in, 25-G, (x1) Block Needle type: Spinal Amount/quantity: 4 Size: Regular (3.5-inch) Gauge: 22G    Standing Status:   Standing    Number of Occurrences:   1    Order Specific Question:   Specify    Answer:   Block Tray   NCV with EMG(electromyography)    Bilateral testing requested.    Standing Status:   Future    Standing Expiration Date:   01/18/2023    Scheduling Instructions:     Please refer this patient to [facility] for Nerve Conduction testing of the lower extremities. (EMG & PNCV)    Order Specific Question:   Where should this test be performed?    Answer:   other    Order Specific Question:   Release to patient    Answer:   Immediate   Chronic Opioid Analgesic:  Oxycodone IR 10 mg tablet, 1 tab p.o. daily (# 30) (last filled on 06/19/2022) MME/day: 15 mg/day   Medications ordered for procedure: Meds ordered this encounter  Medications   lidocaine (XYLOCAINE) 2 % (with pres) injection 400 mg   pentafluoroprop-tetrafluoroeth (GEBAUERS) aerosol   lactated ringers infusion   midazolam (VERSED) injection 0.5-2 mg    Make sure Flumazenil is available in the pyxis when using this medication. If oversedation occurs, administer 0.2 mg IV over 15 sec. If after 45 sec no response, administer 0.2 mg again over 1 min; may repeat at 1 min intervals; not to exceed 4 doses (1 mg)   fentaNYL (SUBLIMAZE) injection 25-50 mcg    Make sure Narcan is available in  the pyxis when using this medication. In the event of respiratory depression (RR< 8/min): Titrate NARCAN (naloxone) in increments of 0.1 to 0.2 mg IV at 2-3 minute intervals, until desired degree of reversal.   ropivacaine (PF) 2 mg/mL (0.2%) (NAROPIN) injection 18 mL   triamcinolone acetonide (KENALOG-40) injection 80 mg   Medications administered: We administered lidocaine, pentafluoroprop-tetrafluoroeth, lactated ringers,  midazolam, fentaNYL, ropivacaine (PF) 2 mg/mL (0.2%), and triamcinolone acetonide.  See the medical record for exact dosing, route, and time of administration.  Follow-up plan:   Return in about 2 weeks (around 10/03/2022) for Proc-day (T,Th), (F2F), (PPE).       Interventional Therapies  Risk  Complexity Considerations:   Estimated body mass index is 21.29 kg/m as calculated from the following:   Height as of this encounter: 5' (1.524 m).   Weight as of this encounter: 109 lb (49.4 kg). WNL   Planned  Pending:   Pending LE EMG/PNCV    Under consideration:   Diagnostic/therapeutic (Midline) caudal ESI + epidurogram #1  Diagnostic/therapeutic bilateral L3 transforaminal ESI #1  Diagnostic bilateral lumbar facet MBB (L2-S1) (to include the L3-4 level) #1    Completed:   Diagnostic bilateral lumbar facet MBB (L3-S1) x1 (09/19/2022) (4-0/10)    Completed by other providers:   Therapeutic bilateral L3, L4, and L5 MB RFA x1 (02/16/2022) by Sharlet Salina, DO  Diagnostic bilateral L3-5 MBB x2 (12/30/2021; 01/17/2022) by Sharlet Salina, DO  Therapeutic right L5-S1 TESI + right S1 TESI x1 (11/29/2021) by Sharlet Salina, DO  Therapeutic sacral trigger point inj. 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    Therapeutic  Palliative (PRN) options:   None established     Recent Visits Date Type Provider Dept  09/13/22 Office Visit Milinda Pointer, MD [facility]  08/14/22 Office Visit Milinda Pointer, MD [facility]  07/05/22 Office Visit Milinda Pointer, MD [facility]  Showing recent visits within past 90 days and meeting all other requirements Today's Visits Date Type Provider Dept  09/19/22 Procedure visit Milinda Pointer, MD [facility]   Showing today's visits and meeting all other requirements Future Appointments Date Type Provider Dept  10/03/22 Appointment Milinda Pointer, MD [facility]  10/11/22 Appointment Milinda Pointer, MD [facility]  Showing future appointments within next 90 days and meeting all other requirements  Disposition: Discharge home  Discharge (Date  Time): 09/19/2022; 0919 hrs.   Primary Care Physician: Burnard Hawthorne, FNP Location: [facility] Note by: Gaspar Cola, MD Date: 09/19/2022; Time: 10:44 AM  Disclaimer:  Medicine is not an Chief Strategy Officer. The only guarantee in medicine is that nothing is guaranteed. It is important to note that the decision to proceed with this intervention was based on the information collected from the patient. The Data and conclusions were drawn from the patient's questionnaire, the interview, and the physical examination. Because the information was provided in large part by the patient, it cannot be guaranteed that it has not been purposely or unconsciously manipulated. Every effort has been made to obtain as much relevant data as possible for this evaluation. It is important to note that the conclusions that lead to this procedure are derived in large part from the available data. Always take into account that the treatment will also  be dependent on availability of resources and existing treatment guidelines, considered by other Pain Management Practitioners as being common knowledge and practice, at the time of the intervention. For Medico-Legal purposes, it is also important to point out that variation in procedural techniques and pharmacological choices are the acceptable norm. The indications, contraindications, technique, and results of the above procedure should only be interpreted and judged by a Board-Certified Interventional Pain Specialist with extensive familiarity and expertise in the same exact  procedure and technique.

## 2022-09-18 ENCOUNTER — Encounter: Payer: No Typology Code available for payment source | Admitting: Student

## 2022-09-18 ENCOUNTER — Other Ambulatory Visit: Payer: Self-pay

## 2022-09-19 ENCOUNTER — Encounter: Payer: No Typology Code available for payment source | Admitting: Occupational Therapy

## 2022-09-19 ENCOUNTER — Ambulatory Visit
Admission: RE | Admit: 2022-09-19 | Discharge: 2022-09-19 | Disposition: A | Discharge status: Home / Self Care | Payer: No Typology Code available for payment source | Source: Ambulatory Visit | Attending: Pain Medicine | Admitting: Pain Medicine

## 2022-09-19 ENCOUNTER — Ambulatory Visit: Payer: No Typology Code available for payment source | Attending: Pain Medicine | Admitting: Pain Medicine

## 2022-09-19 ENCOUNTER — Encounter: Payer: Self-pay | Admitting: Pain Medicine

## 2022-09-19 VITALS — BP 141/80 | HR 73 | Temp 97.0°F | Resp 16 | Ht 60.0 in | Wt 115.0 lb

## 2022-09-19 DIAGNOSIS — M47817 Spondylosis without myelopathy or radiculopathy, lumbosacral region: Secondary | ICD-10-CM | POA: Insufficient documentation

## 2022-09-19 DIAGNOSIS — M545 Low back pain, unspecified: Secondary | ICD-10-CM | POA: Diagnosis present

## 2022-09-19 DIAGNOSIS — G8929 Other chronic pain: Secondary | ICD-10-CM | POA: Insufficient documentation

## 2022-09-19 DIAGNOSIS — M2540 Effusion, unspecified joint: Secondary | ICD-10-CM | POA: Insufficient documentation

## 2022-09-19 DIAGNOSIS — M79604 Pain in right leg: Secondary | ICD-10-CM | POA: Insufficient documentation

## 2022-09-19 DIAGNOSIS — M5416 Radiculopathy, lumbar region: Secondary | ICD-10-CM | POA: Insufficient documentation

## 2022-09-19 DIAGNOSIS — M79605 Pain in left leg: Secondary | ICD-10-CM | POA: Insufficient documentation

## 2022-09-19 DIAGNOSIS — M47819 Spondylosis without myelopathy or radiculopathy, site unspecified: Secondary | ICD-10-CM | POA: Diagnosis present

## 2022-09-19 DIAGNOSIS — R937 Abnormal findings on diagnostic imaging of other parts of musculoskeletal system: Secondary | ICD-10-CM | POA: Insufficient documentation

## 2022-09-19 DIAGNOSIS — M4807 Spinal stenosis, lumbosacral region: Secondary | ICD-10-CM | POA: Insufficient documentation

## 2022-09-19 MED ORDER — TRIAMCINOLONE ACETONIDE 40 MG/ML IJ SUSP
80.0000 mg | Freq: Once | INTRAMUSCULAR | Status: AC
  Administered 2022-09-19: 80 mg

## 2022-09-19 MED ORDER — FENTANYL CITRATE (PF) 100 MCG/2ML IJ SOLN
25.0000 ug | INTRAMUSCULAR | Status: DC | PRN
  Administered 2022-09-19: 50 ug via INTRAVENOUS

## 2022-09-19 MED ORDER — MIDAZOLAM HCL 2 MG/2ML IJ SOLN
0.5000 mg | Freq: Once | INTRAMUSCULAR | Status: AC
  Administered 2022-09-19: 3 mg via INTRAVENOUS

## 2022-09-19 MED ORDER — TRIAMCINOLONE ACETONIDE 40 MG/ML IJ SUSP
INTRAMUSCULAR | Status: AC
  Filled 2022-09-19: qty 2

## 2022-09-19 MED ORDER — ROPIVACAINE HCL 2 MG/ML IJ SOLN
18.0000 mL | Freq: Once | INTRAMUSCULAR | Status: AC
  Administered 2022-09-19: 18 mL via PERINEURAL

## 2022-09-19 MED ORDER — MIDAZOLAM HCL 5 MG/5ML IJ SOLN
INTRAMUSCULAR | Status: AC
  Filled 2022-09-19: qty 5

## 2022-09-19 MED ORDER — LIDOCAINE HCL 2 % IJ SOLN
20.0000 mL | Freq: Once | INTRAMUSCULAR | Status: AC
  Administered 2022-09-19: 400 mg

## 2022-09-19 MED ORDER — ROPIVACAINE HCL 2 MG/ML IJ SOLN
INTRAMUSCULAR | Status: AC
  Filled 2022-09-19: qty 20

## 2022-09-19 MED ORDER — LIDOCAINE HCL 2 % IJ SOLN
INTRAMUSCULAR | Status: AC
  Filled 2022-09-19: qty 20

## 2022-09-19 MED ORDER — PENTAFLUOROPROP-TETRAFLUOROETH EX AERO
INHALATION_SPRAY | Freq: Once | CUTANEOUS | Status: AC
  Administered 2022-09-19: 30 via TOPICAL

## 2022-09-19 MED ORDER — LACTATED RINGERS IV SOLN: Freq: Once | INTRAVENOUS | Status: AC

## 2022-09-19 MED ORDER — FENTANYL CITRATE (PF) 100 MCG/2ML IJ SOLN
INTRAMUSCULAR | Status: AC
  Filled 2022-09-19: qty 2

## 2022-09-19 NOTE — Patient Instructions (Addendum)
Post-procedure Information What to expect: Most procedures involve the use of a local anesthetic (numbing medicine), and a steroid (anti-inflammatory medicine).  The local anesthetics may cause temporary numbness and weakness of the legs or arms, depending on the location of the block. This numbness/weakness may last 4-6 hours, depending on the local anesthetic used. In rare instances, it can last up to 24 hours. While numb, you must be very careful not to injure the extremity.  After any procedure, you could expect the pain to get better within 15-20 minutes. This relief is temporary and may last 4-6 hours. Once the local anesthetics wears off, you could experience discomfort, possibly more than usual, for up to 10 (ten) days. In the case of radiofrequencies, it may last up to 6 weeks. Surgeries may take up to 8 weeks for the healing process. The discomfort is due to the irritation caused by needles going through skin and muscle. To minimize the discomfort, we recommend using ice the first day, and heat from then on. The ice should be applied for 15 minutes on, and 15 minutes off. Keep repeating this cycle until bedtime. Avoid applying the ice directly to the skin, to prevent frostbite. Heat should be used daily, until the pain improves (4-10 days). Be careful not to burn yourself.  Occasionally you may experience muscle spasms or cramps. These occur as a consequence of the irritation caused by the needle sticks to the muscle and the blood that will inevitably be lost into the surrounding muscle tissue. Blood tends to be very irritating to tissues, which tend to react by going into spasm. These spasms may start the same day of your procedure, but they may also take days to develop. This late onset type of spasm or cramp is usually caused by electrolyte imbalances triggered by the steroids, at the level of the kidney. Cramps and spasms tend to respond well to muscle relaxants, multivitamins (some are  triggered by the procedure, but may have their origins in vitamin deficiencies), and "Gatorade", or any sports drinks that can replenish any electrolyte imbalances. (If you are a diabetic, ask your pharmacist to get you a sugar-free brand.) Warm showers or baths may also be helpful. Stretching exercises are highly recommended. General Instructions:  Be alert for signs of possible infection: redness, swelling, heat, red streaks, elevated temperature, and/or fever. These typically appear 4 to 6 days after the procedure. Immediately notify your doctor if you experience unusual bleeding, difficulty breathing, or loss of bowel or bladder control. If you experience increased pain, do not increase your pain medicine intake, unless instructed by your pain physician. Post-Procedure Care:  Be careful in moving about. Muscle spasms in the area of the injection may occur. Applying ice or heat to the area is often helpful. The incidence of spinal headaches after epidural injections ranges between 1.4% and 6%. If you develop a headache that does not seem to respond to conservative therapy, please let your physician know. This can be treated with an epidural blood patch.   Post-procedure numbness or redness is to be expected, however it should average 4 to 6 hours. If numbness and weakness of your extremities begins to develop 4 to 6 hours after your procedure, and is felt to be progressing and worsening, immediately contact your physician.   Diet:  If you experience nausea, do not eat until this sensation goes away. If you had a "Stellate Ganglion Block" for upper extremity "Reflex Sympathetic Dystrophy", do not eat or drink until your  hoarseness goes away. In any case, always start with liquids first and if you tolerate them well, then slowly progress to more solid foods. Activity:  For the first 4 to 6 hours after the procedure, use caution in moving about as you may experience numbness and/or weakness. Use caution in  cooking, using household electrical appliances, and climbing steps. If you need to reach your Doctor call our office: [phone] Monday-Thursday 8:00 am - 4:00 PM    Fridays: Closed     In case of an emergency: In case of emergency, call 911 or go to the nearest emergency room and have the physician there call us.  Interpretation of Procedure Every nerve block has two components: a diagnostic component, and a treatment component. Unrealistic expectations are the most common causes of "perceived failure".  In a perfect world, a single nerve block should be able to completely and permanently eliminate the pain. Sadly, the world is not perfect.  Most pain management nerve blocks are performed using local anesthetics and steroids. Steroids are responsible for any long-term benefit that you may experience. Their purpose is to decrease any chronic swelling that may exist in the area. Steroids begin to work immediately after being injected. However, most patients will not experience any benefits until 5 to 10 days after the injection, when the swelling has come down to the point where they can tell a difference. Steroids will only help if there is swelling to be treated. As such, they can assist with the diagnosis. If effective, they suggest an inflammatory component to the pain, and if ineffective, they rule out inflammation as the main cause or component of the problem. If the problem is one of mechanical compression, you will get no benefit from those steroids.   In the case of local anesthetics, they have a crucial role in the diagnosis of your condition. Most will begin to work within15 to 20 minutes after injection. The duration will depend on the type used (short- vs. Long-acting). It is of outmost importance that patients keep tract of their pain, after the procedure. To assist with this matter, a "Post-procedure Pain Diary" is provided. Make sure to complete it and to bring it back to your  follow-up appointment.  As long as the patient keeps accurate, detailed records of their symptoms after every procedure, and returns to have those interpreted, every procedure will provide [place] with invaluable information. Even a block that does not provide the patient with any relief, will always provide [place] with information about the mechanism and the origin of the pain. The only time a nerve block can be considered a waste of time is when patients do not keep track of the results, or do not keep their post-procedure appointment.  Reporting the results back to your physician The Pain Score  Pain is a subjective complaint. It cannot be seen, touched, or measured. We depend entirely on the patient's report of the pain in order to assess your condition and treatment. To evaluate the pain, we use a pain scale, where "0" means "No Pain", and a "10" is "the worst possible pain that you can even imagine" (i.e. something like been eaten alive by a shark or being torn apart by a lion).   You will frequently be asked to rate your pain. Please be as accurate, remember that medical decisions will be based on your responses. Please do not rate your pain above a 10. Doing so is actually interpreted as "symptom magnification" (exaggeration), as  well as lack of understanding with regards to the scale. To put this into perspective, when you tell us that your pain is at a 10 (ten), what you are saying is that there is nothing we can do to make this pain any worse. (Carefully think about that.)  ____________________________________________________________________________________________  Post-Procedure Discharge Instructions  Instructions: Apply ice:  Purpose: This will minimize any swelling and discomfort after procedure.  When: Day of procedure, as soon as you get home. How: Fill a plastic sandwich bag with crushed ice. Cover it with a small towel and apply to injection site. How long: (15 min on, 15 min off) Apply  for 15 minutes then remove x 15 minutes.  Repeat sequence on day of procedure, until you go to bed. Apply heat:  Purpose: To treat any soreness and discomfort from the procedure. When: Starting the next day after the procedure. How: Apply heat to procedure site starting the day following the procedure. How long: May continue to repeat daily, until discomfort goes away. Food intake: Start with clear liquids (like water) and advance to regular food, as tolerated.  Physical activities: Keep activities to a minimum for the first 8 hours after the procedure. After that, then as tolerated. Driving: If you have received any sedation, be responsible and do not drive. You are not allowed to drive for 24 hours after having sedation. Blood thinner: (Applies only to those taking blood thinners) You may restart your blood thinner 6 hours after your procedure. Insulin: (Applies only to Diabetic patients taking insulin) As soon as you can eat, you may resume your normal dosing schedule. Infection prevention: Keep procedure site clean and dry. Shower daily and clean area with soap and water. Post-procedure Pain Diary: Extremely important that this be done correctly and accurately. Recorded information will be used to determine the next step in treatment. For the purpose of accuracy, follow these rules: Evaluate only the area treated. Do not report or include pain from an untreated area. For the purpose of this evaluation, ignore all other areas of pain, except for the treated area. After your procedure, avoid taking a long nap and attempting to complete the pain diary after you wake up. Instead, set your alarm clock to go off every hour, on the hour, for the initial 8 hours after the procedure. Document the duration of the numbing medicine, and the relief you are getting from it. Do not go to sleep and attempt to complete it later. It will not be accurate. If you received sedation, it is likely that you were given a  medication that may cause amnesia. Because of this, completing the diary at a later time may cause the information to be inaccurate. This information is needed to plan your care. Follow-up appointment: Keep your post-procedure follow-up evaluation appointment after the procedure (usually 2 weeks for most procedures, 6 weeks for radiofrequencies). DO NOT FORGET to bring you pain diary with you.   Expect: (What should I expect to see with my procedure?) From numbing medicine (AKA: Local Anesthetics): Numbness or decrease in pain. You may also experience some weakness, which if present, could last for the duration of the local anesthetic. Onset: Full effect within 15 minutes of injected. Duration: It will depend on the type of local anesthetic used. On the average, 1 to 8 hours.  From steroids (Applies only if steroids were used): Decrease in swelling or inflammation. Once inflammation is improved, relief of the pain will follow. Onset of benefits: Depends on the  amount of swelling present. The more swelling, the longer it will take for the benefits to be seen. In some cases, up to 10 days. Duration: Steroids will stay in the system x 2 weeks. Duration of benefits will depend on multiple posibilities including persistent irritating factors. Side-effects: If present, they may typically last 2 weeks (the duration of the steroids). Frequent: Cramps (if they occur, drink Gatorade and take over-the-counter Magnesium 450-500 mg once to twice a day); water retention with temporary weight gain; increases in blood sugar; decreased immune system response; increased appetite. Occasional: Facial flushing (red, warm cheeks); mood swings; menstrual changes. Uncommon: Long-term decrease or suppression of natural hormones; bone thinning. (These are more common with higher doses or more frequent use. This is why we prefer that our patients avoid having any injection therapies in other practices.)  Very Rare: Severe mood  changes; psychosis; aseptic necrosis. From procedure: Some discomfort is to be expected once the numbing medicine wears off. This should be minimal if ice and heat are applied as instructed.  Call if: (When should I call?) You experience numbness and weakness that gets worse with time, as opposed to wearing off. New onset bowel or bladder incontinence. (Applies only to procedures done in the spine)  Emergency Numbers: Durning business hours (Monday - Thursday, 8:00 AM - 4:00 PM) (Friday, 9:00 AM - 12:00 Noon): [phone] After hours: [phone] NOTE: If you are having a problem and are unable connect with, or to talk to a provider, then go to your nearest urgent care or emergency department. If the problem is serious and urgent, please call 911. ____________________________________________________________________________________________    ____________________________________________________________________________________________  Virtual Visits   What is a "Virtual Visit"? It is a Metallurgist (medical visit) that takes place on real time (NOT TEXT or E-MAIL) over the telephone or computer device (desktop, laptop, tablet, smart phone, etc.). It allows for more location flexibility between the patient and the healthcare provider.  Who decides when these types of visits will be used? The physician.  Who is eligible for these types of visits? Only those patients that can be reliably reached over the telephone.  What do you mean by reliably? We do not have time to call everyone multiple times, therefore those that tend to screen calls and then call back later are not suitable candidates for this system. We understand how people are reluctant to pickup on "unknown" calls, therefore, we suggest adding our telephone numbers to your list of "CONTACT(s)". This way, you should be able to readily identify our calls when you receive one. All of our numbers are  available below.   Who is not eligible? This option is not available for medication management encounters, specially for controlled substances. Patients on pain medications that fall under the category of controlled substances have to come in for "Face-to-Face" encounters. This is required for mandatory monitoring of these substances. You may be asked to provide a sample for an unannounced urine drug screening test (UDS), and we will need to count your pain pills. Not bringing your pills to be counted may result in no refill. Obviously, neither one of these can be done over the phone.  When will this type of visits be used? You can request a virtual visit whenever you are physically unable to attend a regular appointment. The decision will be made by the physician (or healthcare provider) on a case by case basis.   At what time will I be called? This is an  excellent question. The providers will try to call you whenever they have time available. Do not expect to be called at any specific time. The secretaries will assign you a time for your virtual visit appointment, but this is done simply to keep a list of those patients that need to be called, but not for the purpose of keeping a time schedule. Be advised that the call may come in anytime during the day, between the hours of 8:00 AM and 8::00 PM, depending on provider availability. We do understand that the system is not perfect. If you are unable to be available that day on a moments notice, then request an "in-person" appointment rather than a "virtual visit".  Can I request my medication visits to be "Virtual"? Yes you may request it, but the decision is entirely up to the healthcare provider. Control substances require specific monitoring that requires Face-to-Face encounters. The number of encounters  and the extent of the monitoring is determined on a case by case basis.  Add a new contact to your smart phone and label it "PAIN CLINIC" Under  this contact add the following numbers: Main: [phone] (Official Contact Number) Nurses: [phone] (These are outgoing only calling systems. Do not call this number.) Dr. Dossie Arbour: [phone] or [phone] (Outgoing calls only. Do not call this number.)  ____________________________________________________________________________________________

## 2022-09-20 ENCOUNTER — Ambulatory Visit: Payer: No Typology Code available for payment source | Admitting: Occupational Therapy

## 2022-09-20 ENCOUNTER — Telehealth: Payer: Self-pay

## 2022-09-20 ENCOUNTER — Ambulatory Visit: Payer: No Typology Code available for payment source

## 2022-09-20 NOTE — Telephone Encounter (Signed)
Post procedure follow up.  Patient states that she is doing good.

## 2022-09-21 ENCOUNTER — Ambulatory Visit: Payer: No Typology Code available for payment source | Admitting: Occupational Therapy

## 2022-09-25 ENCOUNTER — Other Ambulatory Visit (HOSPITAL_COMMUNITY): Payer: Self-pay

## 2022-09-26 ENCOUNTER — Other Ambulatory Visit: Payer: Self-pay | Admitting: Family

## 2022-09-26 ENCOUNTER — Encounter: Payer: No Typology Code available for payment source | Admitting: Plastic Surgery

## 2022-09-26 DIAGNOSIS — M545 Low back pain, unspecified: Secondary | ICD-10-CM

## 2022-09-27 ENCOUNTER — Other Ambulatory Visit (HOSPITAL_COMMUNITY): Payer: Self-pay

## 2022-09-27 ENCOUNTER — Other Ambulatory Visit: Payer: Self-pay

## 2022-09-27 ENCOUNTER — Inpatient Hospital Stay: Payer: No Typology Code available for payment source | Attending: Oncology

## 2022-09-27 DIAGNOSIS — Z170 Estrogen receptor positive status [ER+]: Secondary | ICD-10-CM

## 2022-09-27 DIAGNOSIS — Z5111 Encounter for antineoplastic chemotherapy: Secondary | ICD-10-CM | POA: Diagnosis present

## 2022-09-27 DIAGNOSIS — C50412 Malignant neoplasm of upper-outer quadrant of left female breast: Secondary | ICD-10-CM | POA: Diagnosis present

## 2022-09-27 DIAGNOSIS — Z79899 Other long term (current) drug therapy: Secondary | ICD-10-CM | POA: Diagnosis not present

## 2022-09-27 MED ORDER — GOSERELIN ACETATE 3.6 MG ~~LOC~~ IMPL
3.6000 mg | DRUG_IMPLANT | SUBCUTANEOUS | Status: DC
  Administered 2022-09-27: 3.6 mg via SUBCUTANEOUS
  Filled 2022-09-27: qty 3.6

## 2022-09-28 ENCOUNTER — Other Ambulatory Visit: Payer: Self-pay

## 2022-09-29 ENCOUNTER — Encounter: Payer: Self-pay | Admitting: Family

## 2022-09-29 ENCOUNTER — Other Ambulatory Visit: Payer: Self-pay | Admitting: Family

## 2022-09-29 ENCOUNTER — Other Ambulatory Visit: Payer: Self-pay

## 2022-09-29 DIAGNOSIS — M545 Low back pain, unspecified: Secondary | ICD-10-CM

## 2022-10-02 ENCOUNTER — Encounter: Payer: Self-pay | Admitting: Oncology

## 2022-10-02 ENCOUNTER — Other Ambulatory Visit: Payer: Self-pay | Admitting: Family

## 2022-10-02 ENCOUNTER — Other Ambulatory Visit: Payer: Self-pay

## 2022-10-02 DIAGNOSIS — M545 Low back pain, unspecified: Secondary | ICD-10-CM

## 2022-10-03 ENCOUNTER — Ambulatory Visit: Payer: No Typology Code available for payment source | Attending: Pain Medicine | Admitting: Pain Medicine

## 2022-10-03 ENCOUNTER — Other Ambulatory Visit: Payer: Self-pay

## 2022-10-03 ENCOUNTER — Encounter: Payer: Self-pay | Admitting: Pain Medicine

## 2022-10-03 VITALS — BP 124/86 | HR 83 | Temp 97.7°F | Resp 18 | Ht 60.0 in | Wt 119.0 lb

## 2022-10-03 DIAGNOSIS — R52 Pain, unspecified: Secondary | ICD-10-CM | POA: Diagnosis present

## 2022-10-03 DIAGNOSIS — M4156 Other secondary scoliosis, lumbar region: Secondary | ICD-10-CM | POA: Diagnosis present

## 2022-10-03 DIAGNOSIS — M79662 Pain in left lower leg: Secondary | ICD-10-CM | POA: Diagnosis present

## 2022-10-03 DIAGNOSIS — Z853 Personal history of malignant neoplasm of breast: Secondary | ICD-10-CM | POA: Diagnosis present

## 2022-10-03 DIAGNOSIS — M25552 Pain in left hip: Secondary | ICD-10-CM | POA: Diagnosis present

## 2022-10-03 DIAGNOSIS — M79602 Pain in left arm: Secondary | ICD-10-CM | POA: Insufficient documentation

## 2022-10-03 DIAGNOSIS — G894 Chronic pain syndrome: Secondary | ICD-10-CM | POA: Diagnosis present

## 2022-10-03 DIAGNOSIS — M545 Low back pain, unspecified: Secondary | ICD-10-CM | POA: Diagnosis not present

## 2022-10-03 DIAGNOSIS — M533 Sacrococcygeal disorders, not elsewhere classified: Secondary | ICD-10-CM | POA: Insufficient documentation

## 2022-10-03 DIAGNOSIS — M2540 Effusion, unspecified joint: Secondary | ICD-10-CM | POA: Insufficient documentation

## 2022-10-03 DIAGNOSIS — Z79891 Long term (current) use of opiate analgesic: Secondary | ICD-10-CM

## 2022-10-03 DIAGNOSIS — M79604 Pain in right leg: Secondary | ICD-10-CM | POA: Insufficient documentation

## 2022-10-03 DIAGNOSIS — M79672 Pain in left foot: Secondary | ICD-10-CM | POA: Insufficient documentation

## 2022-10-03 DIAGNOSIS — Z79899 Other long term (current) drug therapy: Secondary | ICD-10-CM

## 2022-10-03 DIAGNOSIS — G8929 Other chronic pain: Secondary | ICD-10-CM | POA: Insufficient documentation

## 2022-10-03 DIAGNOSIS — M47817 Spondylosis without myelopathy or radiculopathy, lumbosacral region: Secondary | ICD-10-CM

## 2022-10-03 DIAGNOSIS — M5416 Radiculopathy, lumbar region: Secondary | ICD-10-CM

## 2022-10-03 DIAGNOSIS — M79661 Pain in right lower leg: Secondary | ICD-10-CM

## 2022-10-03 DIAGNOSIS — M79671 Pain in right foot: Secondary | ICD-10-CM | POA: Diagnosis present

## 2022-10-03 DIAGNOSIS — M47819 Spondylosis without myelopathy or radiculopathy, site unspecified: Secondary | ICD-10-CM

## 2022-10-03 DIAGNOSIS — M4807 Spinal stenosis, lumbosacral region: Secondary | ICD-10-CM

## 2022-10-03 DIAGNOSIS — M79601 Pain in right arm: Secondary | ICD-10-CM | POA: Insufficient documentation

## 2022-10-03 DIAGNOSIS — R937 Abnormal findings on diagnostic imaging of other parts of musculoskeletal system: Secondary | ICD-10-CM | POA: Diagnosis present

## 2022-10-03 DIAGNOSIS — M79605 Pain in left leg: Secondary | ICD-10-CM | POA: Diagnosis present

## 2022-10-03 MED ORDER — OXYCODONE HCL 5 MG PO TABS
5.0000 mg | ORAL_TABLET | Freq: Two times a day (BID) | ORAL | 0 refills | Status: DC
  Filled 2022-10-18: qty 60, 30d supply, fill #0

## 2022-10-03 NOTE — Patient Instructions (Signed)
______________________________________________________________________  Preparing for your procedure  During your procedure appointment there will be: No Prescription Refills. No disability issues to discussed. No medication changes or discussions.  Instructions: Food intake: Avoid eating anything solid for at least 8 hours prior to your procedure. Clear liquid intake: You may take clear liquids such as water up to 2 hours prior to your procedure. (No carbonated drinks. No soda.) Transportation: Unless otherwise stated by your physician, bring a driver. Morning Medicines: Except for blood thinners, take all of your other morning medications with a sip of water. Make sure to take your heart and blood pressure medicines. If your blood pressure's lower number is above 100, the case will be rescheduled. Blood thinners: If you take a blood thinner, but were not instructed to stop it, call our office [phone] and ask to talk to a nurse. Not stopping a blood thinner prior to certain procedures could lead to serious complications. Diabetics on insulin: Notify the staff so that you can be scheduled 1st case in the morning. If your diabetes requires high dose insulin, take only  of your normal insulin dose the morning of the procedure and notify the staff that you have done so. Preventing infections: Shower with an antibacterial soap the morning of your procedure.  Build-up your immune system: Take 1000 mg of Vitamin C with every meal (3 times a day) the day prior to your procedure. Antibiotics: Inform the nursing staff if you are taking any antibiotics or if you have any conditions that may require antibiotics prior to procedures. (Example: recent joint implants)   Pregnancy: If you are pregnant make sure to notify the nursing staff. Not doing so may result in injury to the fetus, including death.  Sickness: If you have a cold, fever, or any active infections, call and cancel or reschedule your  procedure. Receiving steroids while having an infection may result in complications. Arrival: You must be in the facility at least 30 minutes prior to your scheduled procedure. Tardiness: Your scheduled time is also the cutoff time. If you do not arrive at least 15 minutes prior to your procedure, you will be rescheduled.  Children: Do not bring any children with you. Make arrangements to keep them home. Dress appropriately: There is always a possibility that your clothing may get soiled. Avoid long dresses. Valuables: Do not bring any jewelry or valuables.  Reasons to call and reschedule or cancel your procedure: (Following these recommendations will minimize the risk of a serious complication.) Surgeries: Avoid having procedures within 2 weeks of any surgery. (Avoid for 2 weeks before or after any surgery). Flu Shots: Avoid having procedures within 2 weeks of a flu shots or . (Avoid for 2 weeks before or after immunizations). Barium: Avoid having a procedure within 7-10 days after having had a radiological study involving the use of radiological contrast. (Myelograms, Barium swallow or enema study). Heart attacks: Avoid any elective procedures or surgeries for the initial 6 months after a "Myocardial Infarction" (Heart Attack). Blood thinners: It is imperative that you stop these medications before procedures. Let us know if you if you take any blood thinner.  Infection: Avoid procedures during or within two weeks of an infection (including chest colds or gastrointestinal problems). Symptoms associated with infections include: Localized redness, fever, chills, night sweats or profuse sweating, burning sensation when voiding, cough, congestion, stuffiness, runny nose, sore throat, diarrhea, nausea, vomiting, cold or Flu symptoms, recent or current infections. It is specially important if the infection is  over the area that we intend to treat. Heart and lung problems: Symptoms that may suggest an  active cardiopulmonary problem include: cough, chest pain, breathing difficulties or shortness of breath, dizziness, ankle swelling, uncontrolled high or unusually low blood pressure, and/or palpitations. If you are experiencing any of these symptoms, cancel your procedure and contact your primary care physician for an evaluation.  Remember:  Regular Business hours are:  Monday to Thursday 8:00 AM to 4:00 PM  Provider's Schedule: Milinda Pointer, MD:  Procedure days: Tuesday and Thursday 7:30 AM to 4:00 PM  Gillis Santa, MD:  Procedure days: Monday and Wednesday 7:30 AM to 4:00 PM  ______________________________________________________________________    ____________________________________________________________________________________________  General Risks and Possible Complications  Patient Responsibilities: It is important that you read this as it is part of your informed consent. It is our duty to inform you of the risks and possible complications associated with treatments offered to you. It is your responsibility as a patient to read this and to ask questions about anything that is not clear or that you believe was not covered in this document.  Patient's Rights: You have the right to refuse treatment. You also have the right to change your mind, even after initially having agreed to have the treatment done. However, under this last option, if you wait until the last second to change your mind, you may be charged for the materials used up to that point.  Introduction: Medicine is not an Chief Strategy Officer. Everything in Medicine, including the lack of treatment(s), carries the potential for danger, harm, or loss (which is by definition: Risk). In Medicine, a complication is a secondary problem, condition, or disease that can aggravate an already existing one. All treatments carry the risk of possible complications. The fact that a side effects or complications occurs, does not imply  that the treatment was conducted incorrectly. It must be clearly understood that these can happen even when everything is done following the highest safety standards.  No treatment: You can choose not to proceed with the proposed treatment alternative. The "PRO(s)" would include: avoiding the risk of complications associated with the therapy. The "CON(s)" would include: not getting any of the treatment benefits. These benefits fall under one of three categories: diagnostic; therapeutic; and/or palliative. Diagnostic benefits include: getting information which can ultimately lead to improvement of the disease or symptom(s). Therapeutic benefits are those associated with the successful treatment of the disease. Finally, palliative benefits are those related to the decrease of the primary symptoms, without necessarily curing the condition (example: decreasing the pain from a flare-up of a chronic condition, such as incurable terminal cancer).  General Risks and Complications: These are associated to most interventional treatments. They can occur alone, or in combination. They fall under one of the following six (6) categories: no benefit or worsening of symptoms; bleeding; infection; nerve damage; allergic reactions; and/or death. No benefits or worsening of symptoms: In Medicine there are no guarantees, only probabilities. No healthcare provider can ever guarantee that a medical treatment will work, they can only state the probability that it may. Furthermore, there is always the possibility that the condition may worsen, either directly, or indirectly, as a consequence of the treatment. Bleeding: This is more common if the patient is taking a blood thinner, either prescription or over the counter (example: Goody Powders, Fish oil, Aspirin, Garlic, etc.), or if suffering a condition associated with impaired coagulation (example: Hemophilia, cirrhosis of the liver, low platelet counts, etc.). However, even if  you  do not have one on these, it can still happen. If you have any of these conditions, or take one of these drugs, make sure to notify your treating physician. Infection: This is more common in patients with a compromised immune system, either due to disease (example: diabetes, cancer, human immunodeficiency virus [HIV], etc.), or due to medications or treatments (example: therapies used to treat cancer and rheumatological diseases). However, even if you do not have one on these, it can still happen. If you have any of these conditions, or take one of these drugs, make sure to notify your treating physician. Nerve Damage: This is more common when the treatment is an invasive one, but it can also happen with the use of medications, such as those used in the treatment of cancer. The damage can occur to small secondary nerves, or to large primary ones, such as those in the spinal cord and brain. This damage may be temporary or permanent and it may lead to impairments that can range from temporary numbness to permanent paralysis and/or brain death. Allergic Reactions: Any time a substance or material comes in contact with our body, there is the possibility of an allergic reaction. These can range from a mild skin rash (contact dermatitis) to a severe systemic reaction (anaphylactic reaction), which can result in death. Death: In general, any medical intervention can result in death, most of the time due to an unforeseen complication. ____________________________________________________________________________________________    ____________________________________________________________________________________________  Patient Information update  To: All of our patients.  Re: Name change.  It has been made official that our current name, "[facility]"   will soon be changed to "[facility]".    The purpose of this change is to eliminate any confusion created by the concept of our practice being a "Medication Management Pain Clinic". In the past this has led to the misconception that we treat pain primarily by the use of prescription medications.  Nothing can be farther from the truth.   Understanding PAIN MANAGEMENT: To further understand what our practice does, you first have to understand that "Pain Management" is a subspecialty that requires additional training once a physician has completed their specialty training, which can be in either Anesthesia, Neurology, Psychiatry, or Physical Medicine and Rehabilitation (PMR). Each one of these contributes to the final approach taken by each physician to the management of their patient's pain. To be a "Pain Management Specialist" you must have first completed one of the specialty trainings below.  Anesthesiologists - trained in clinical pharmacology and interventional techniques such as nerve blockade and regional as well as central neuroanatomy. They are trained to block pain before, during, and after surgical interventions.  Neurologists - trained in the diagnosis and pharmacological treatment of complex neurological conditions, such as Multiple Sclerosis, Parkinson's, spinal cord injuries, and other systemic conditions that may be associated with symptoms that may include but are not limited to pain. They tend to rely primarily on the treatment of chronic pain using prescription medications.  Psychiatrist - trained in conditions affecting the psychosocial wellbeing of patients including but not limited to depression, anxiety, schizophrenia, personality disorders, addiction, and other substance use disorders that may be associated with chronic pain. They tend to rely primarily on the treatment of chronic pain using prescription medications.   Physical Medicine and Rehabilitation (PMR) physicians, also known as physiatrists - trained to treat a  wide variety of medical conditions affecting  the brain, spinal cord, nerves, bones, joints, ligaments, muscles, and tendons. Their training is primarily aimed at treating patients that have suffered injuries that have caused severe physical impairment. Their training is primarily aimed at the physical therapy and rehabilitation of those patients. They may also work alongside orthopedic surgeons or neurosurgeons using their expertise in assisting surgical patients to recover after their surgeries.  INTERVENTIONAL PAIN MANAGEMENT is sub-subspecialty of Pain Management.  Our physicians are Board-certified in Anesthesia, Pain Management, and Interventional Pain Management.  This meaning that not only have they been trained and Board-certified in their specialty of Anesthesia, and subspecialty of Pain Management, but they have also received further training in the sub-subspecialty of Interventional Pain Management, in order to become Board-certified as INTERVENTIONAL PAIN MANAGEMENT SPECIALIST.    Mission: Our goal is to use our skills in  [place] as alternatives to the chronic use of prescription opioid medications for the treatment of pain. To make this more clear, we have changed our name to reflect what we do and offer. We will continue to offer medication management assessment and recommendations, but we will not be taking over any patient's medication management.  ____________________________________________________________________________________________     ____________________________________________________________________________________________  National Pain Medication Shortage  The [place] is experiencing worsening drug shortages. These have had a negative widespread effect on patient care and treatment. Not expected to improve any time soon. Predicted to last past 2029.   Drug shortage list (generic names) Oxycodone IR Oxycodone/APAP Oxymorphone  IR Hydromorphone Hydrocodone/APAP Morphine  Where is the problem?  Manufacturing and supply level.  Will this shortage affect you?  Only if you take any of the above pain medications.  How? You may be unable to fill your prescription.  Your pharmacist may offer a "partial fill" of your prescription. (Warning: Do not accept partial fills.) Prescriptions partially filled cannot be transferred to another pharmacy. Read our Medication Rules and Regulation. Depending on how much medicine you are dependent on, you may experience withdrawals when unable to get the medication.  Recommendations: Consider ending your dependence on opioid pain medications. Ask your pain specialist to assist you with the process. Consider switching to a medication currently not in shortage, such as Buprenorphine. Talk to your pain specialist about this option. Consider decreasing your pain medication requirements by managing tolerance thru "Drug Holidays". This may help minimize withdrawals, should you run out of medicine. Control your pain thru the use of non-pharmacological interventional therapies.   Your prescriber: Prescribers cannot be blamed for shortages. Medication manufacturing and supply issues cannot be fixed by the prescriber.   NOTE: The prescriber is not responsible for supplying the medication, or solving supply issues. Work with your pharmacist to solve it. The patient is responsible for the decision to take or continue taking the medication and for identifying and securing a legal supply source. By law, supplying the medication is the job and responsibility of the pharmacy. The prescriber is responsible for the evaluation, monitoring, and prescribing of these medications.   Prescribers will NOT: Re-issue prescriptions that have been partially filled. Re-issue prescriptions already sent to a pharmacy.  Re-send prescriptions to a different pharmacy because yours did not have your medication. Ask  pharmacist to order more medicine or transfer the prescription to another pharmacy. (Read below.)  New 2023 regulation: "June 23, 2022 Revised Regulation Allows DEA-Registered Pharmacies to Transfer Electronic Prescriptions at a Patient's Request [place] Patients now have the ability to request their electronic  prescription be transferred to another pharmacy without having to go back to their practitioner to initiate the request. This revised regulation went into effect on Monday, June 19, 2022.     At a patient's request, a DEA-registered retail pharmacy can now transfer an electronic prescription for a controlled substance (schedules II-V) to another DEA-registered retail pharmacy. Prior to this change, patients would have to go through their practitioner to cancel their prescription and have it re-issued to a different pharmacy. The process was taxing and time consuming for both patients and practitioners.    The Drug Enforcement Administration [facility]) published its intent to revise the process for transferring electronic prescriptions on September 10, 2020.  The final rule was published in the federal register on May 18, 2022 and went into effect 30 days later.  Under the final rule, a prescription can only be transferred once between pharmacies, and only if allowed under existing state or other applicable law. The prescription must remain in its electronic form; may not be altered in any way; and the transfer must be communicated directly between two licensed pharmacists. It's important to note, any authorized refills transfer with the original prescription, which means the entire prescription will be filled at the same pharmacy".  Reference: [email or web address] [facility] website  announcement)  [email or web address] ([place])   [employer] / Vol. 88, No. 143 / Thursday, May 18, 2022 / Rules and Regulations DEPARTMENT OF JUSTICE  Drug Enforcement Administration  21 CFR Part 1306  [Docket No. [id number]]  RIN [id number] Transfer of Electronic Prescriptions for Schedules II-V Controlled Substances Between Pharmacies for Initial Filling  ____________________________________________________________________________________________     ____________________________________________________________________________________________  Drug Holidays  What is a "Drug Holiday"? Drug Holiday: is the name given to the process of slowly tapering down and temporarily stopping the pain medication for the purpose of decreasing or eliminating tolerance to the drug.  Benefits Improved effectiveness Decreased required effective dose Improved pain control End dependence on high dose therapy Decrease cost of therapy Uncovering "opioid-induced hyperalgesia". (OIH)  What is "opioid hyperalgesia"? It is a paradoxical increase in pain caused by exposure to opioids. Stopping the opioid pain medication, contrary to the expected, it actually decreases or completely eliminates the pain. Ref.: "A comprehensive review of opioid-induced hyperalgesia". Brion Aliment, et.al. Pain Physician. 2011 Mar-Apr;14(2):145-61.  What is tolerance? Tolerance: the progressive loss of effectiveness of a pain medicine due to repetitive use. A common problem of opioid pain medications.  How long should a "Drug Holiday" last? Effectiveness depends on the patient staying off all opioid pain medicines for a minimum of 14 consecutive days. (2 weeks)  How about just taking less of the medicine? Does not work. Will not accomplish goal of eliminating the excess receptors.  How about switching to a different pain medicine? (AKA.  "Opioid rotation") Does not work. Creates the illusion of effectiveness by taking advantage of inaccurate equivalent dose calculations between different opioids. -This "technique" was promoted by studies funded by [employer], such as [employer], creators of "OxyContin".  Can I stop the medicine "cold [place]"? Depends. You should always coordinate with your Pain Specialist to make the transition as smoothly as possible. Avoid stopping the medicine abruptly without consulting. We recommend a "slow taper".  What is a slow taper? Taper: refers to the gradual decrease in dose.   How do I stop/taper the dose? Slowly. Decrease the daily amount of pills that you take by one (1) pill every seven (7) days. This is  called a "slow downward taper". Example: if you normally take four (4) pills per day, drop it to three (3) pills per day for seven (7) days, then to two (2) pills per day for seven (7) days, then to one (1) per day for seven (7) days, and then stop the medicine. The 14 day "Drug Holiday" starts on the first day without medicine.   Will I experience withdrawals? Unlikely with a slow taper.  What triggers withdrawals? Withdrawals are triggered by the sudden/abrupt stop of high dose opioids. Withdrawals can be avoided by slowly decreasing the dose over a prolonged period of time.  What are withdrawals? Symptoms associated with sudden/abrupt reduction/stopping of high-dose, long-term use of pain medication. Withdrawal are seldom seen on low dose therapy, or patients rarely taking opioid medication.  Early Withdrawal Symptoms may include: Agitation Anxiety Muscle aches Increased tearing Insomnia Runny nose Sweating Yawning  Late symptoms may include: Abdominal cramping Diarrhea Dilated pupils Goose bumps Nausea Vomiting  (Last update: 10/01/2022) ____________________________________________________________________________________________     _______________________________________________________________________  Medication Rules  Purpose: To inform patients, and their family members, of our medication rules and regulations.  Applies to: All patients receiving prescriptions from our practice (written or electronic).  Pharmacy of record: This is the pharmacy where your electronic prescriptions will be sent. Make sure we have the correct one.  Electronic prescriptions: In compliance with the [place] (STOP) Act of 2017 (Session Lanny Cramp [phone]), effective October 23, 2018, all controlled substances must be electronically prescribed. Written prescriptions, faxing, or calling prescriptions to a pharmacy will no longer be done.  Prescription refills: These will be provided only during in-person appointments. No medications will be renewed without a "face-to-face" evaluation with your provider. Applies to all prescriptions.  NOTE: The following applies primarily to controlled substances (Opioid* Pain Medications).   Type of encounter (visit): For patients receiving controlled substances, face-to-face visits are required. (Not an option and not up to the patient.)  Patient's responsibilities: Pain Pills: Bring all pain pills to every appointment (except for procedure appointments). Pill Bottles: Bring pills in original pharmacy bottle. Bring bottle, even if empty. Always bring the bottle of the most recent fill.  Medication refills: You are responsible for knowing and keeping track of what medications you are taking and when is it that you will need a refill. The day before your appointment: write a list of all prescriptions that need to be refilled. The day of the appointment: give the list to the admitting nurse. Prescriptions will be written only during appointments. No prescriptions will be written on procedure days. If you forget a medication: it will not be "Called in", "Faxed", or  "electronically sent". You will need to get another appointment to get these prescribed. No early refills. Do not call asking to have your prescription filled early. Partial  or short prescriptions: Occasionally your pharmacy may not have enough pills to fill your prescription.  NEVER ACCEPT a partial fill or a prescription that is short of the total amount of pills that you were prescribed.  With controlled substances the law allows 72 hours for the pharmacy to complete the prescription.  If the prescription is not completed within 72 hours, the pharmacist will require a new prescription to be written. This means that you will be short on your medicine and we WILL NOT send another prescription to complete your original prescription.  Instead, request the pharmacy to send a carrier to a nearby branch to get enough medication  to provide you with your full prescription. Prescription Accuracy: You are responsible for carefully inspecting your prescriptions before leaving our office. Have the discharge nurse carefully go over each prescription with you, before taking them home. Make sure that your name is accurately spelled, that your address is correct. Check the name and dose of your medication to make sure it is accurate. Check the number of pills, and the written instructions to make sure they are clear and accurate. Make sure that you are given enough medication to last until your next medication refill appointment. Taking Medication: Take medication as prescribed. When it comes to controlled substances, taking less pills or less frequently than prescribed is permitted and encouraged. Never take more pills than instructed. Never take the medication more frequently than prescribed.  Inform other Doctors: Always inform, all of your healthcare providers, of all the medications you take. Pain Medication from other Providers: You are not allowed to accept any additional pain medication from any other Doctor or  Healthcare provider. There are two exceptions to this rule. (see below) In the event that you require additional pain medication, you are responsible for notifying us, as stated below. Cough Medicine: Often these contain an opioid, such as codeine or hydrocodone. Never accept or take cough medicine containing these opioids if you are already taking an opioid* medication. The combination may cause respiratory failure and death. Medication Agreement: You are responsible for carefully reading and following our Medication Agreement. This must be signed before receiving any prescriptions from our practice. Safely store a copy of your signed Agreement. Violations to the Agreement will result in no further prescriptions. (Additional copies of our Medication Agreement are available upon request.) Laws, Rules, & Regulations: All patients are expected to follow all Federal and [employer], [employer], Rules, [employer] [facility]. Ignorance of the Laws does not constitute a valid excuse.  Illegal drugs and Controlled Substances: The use of illegal substances (including, but not limited to marijuana and its derivatives) and/or the illegal use of any controlled substances is strictly prohibited. Violation of this rule may result in the immediate and permanent discontinuation of any and all prescriptions being written by our practice. The use of any illegal substances is prohibited. Adopted CDC guidelines & recommendations: Target dosing levels will be at or below 60 MME/day. Use of benzodiazepines** is not recommended.  Exceptions: There are only two exceptions to the rule of not receiving pain medications from other Healthcare Providers. Exception #1 (Emergencies): In the event of an emergency (i.e.: accident requiring emergency care), you are allowed to receive additional pain medication. However, you are responsible for: As soon as you are able, call our office [phone], at any time of the day or night, and leave a  message stating your name, the date and nature of the emergency, and the name and dose of the medication prescribed. In the event that your call is answered by a member of our staff, make sure to document and save the date, time, and the name of the person that took your information.  Exception #2 (Planned Surgery): In the event that you are scheduled by another doctor or dentist to have any type of surgery or procedure, you are allowed (for a period no longer than 30 days), to receive additional pain medication, for the acute post-op pain. However, in this case, you are responsible for picking up a copy of our "Post-op Pain Management for Surgeons" handout, and giving it to your surgeon or dentist. This document is  available at our office, and does not require an appointment to obtain it. Simply go to our office during business hours (Monday-Thursday from 8:00 AM to 4:00 PM) (Friday 8:00 AM to 12:00 Noon) or if you have a scheduled appointment with [place], prior to your surgery, and ask for it by name. In addition, you are responsible for: calling our office [phone], at any time of the day or night, and leaving a message stating your name, name of your surgeon, type of surgery, and date of procedure or surgery. Failure to comply with your responsibilities may result in termination of therapy involving the controlled substances. Medication Agreement Violation. Following the above rules, including your responsibilities will help you in avoiding a Medication Agreement Violation ("Breaking your Pain Medication Contract").  Consequences:  Not following the above rules may result in permanent discontinuation of medication prescription therapy.  *Opioid medications include: morphine, codeine, oxycodone, oxymorphone, hydrocodone, hydromorphone, meperidine, tramadol, tapentadol, buprenorphine, fentanyl, methadone. **Benzodiazepine medications include: diazepam (Valium), alprazolam (Xanax), clonazepam (Klonopine),  lorazepam (Ativan), clorazepate (Tranxene), chlordiazepoxide (Librium), estazolam (Prosom), oxazepam (Serax), temazepam (Restoril), triazolam (Halcion) (Last updated: 08/15/2022) ______________________________________________________________________    ______________________________________________________________________  Medication Recommendations and Reminders  Applies to: All patients receiving prescriptions (written and/or electronic).  Medication Rules & Regulations: You are responsible for reading, knowing, and following our "Medication Rules" document. These exist for your safety and that of others. They are not flexible and neither are we. Dismissing or ignoring them is an act of "non-compliance" that may result in complete and irreversible termination of such medication therapy. For safety reasons, "non-compliance" will not be tolerated. As with the [place] fundamental legal principle of "ignorance of the law is no defense", we will accept no excuses for not having read and knowing the content of documents provided to you by our practice.  Pharmacy of record:  Definition: This is the pharmacy where your electronic prescriptions will be sent.  We do not endorse any particular pharmacy. It is up to you and your insurance to decide what pharmacy to use.  We do not restrict you in your choice of pharmacy. However, once we write for your prescriptions, we will NOT be re-sending more prescriptions to fix restricted supply problems created by your pharmacy, or your insurance.  The pharmacy listed in the electronic medical record should be the one where you want electronic prescriptions to be sent. If you choose to change pharmacy, simply notify our nursing staff. Changes will be made only during your regular appointments and not over the phone.  Recommendations: Keep all of your pain medications in a safe place, under lock and key, even if you live alone. We will NOT replace lost, stolen, or  damaged medication. We do not accept "Police Reports" as proof of medications having been stolen. After you fill your prescription, take 1 week's worth of pills and put them away in a safe place. You should keep a separate, properly labeled bottle for this purpose. The remainder should be kept in the original bottle. Use this as your primary supply, until it runs out. Once it's gone, then you know that you have 1 week's worth of medicine, and it is time to come in for a prescription refill. If you do this correctly, it is unlikely that you will ever run out of medicine. To make sure that the above recommendation works, it is very important that you make sure your medication refill appointments are scheduled at least 1 week before you run out of medicine. To  do this in an effective manner, make sure that you do not leave the office without scheduling your next medication management appointment. Always ask the nursing staff to show you in your prescription , when your medication will be running out. Then arrange for the receptionist to get you a return appointment, at least 7 days before you run out of medicine. Do not wait until you have 1 or 2 pills left, to come in. This is very poor planning and does not take into consideration that we may need to cancel appointments due to bad weather, sickness, or emergencies affecting our staff. DO NOT ACCEPT A "Partial Fill": If for any reason your pharmacy does not have enough pills/tablets to completely fill or refill your prescription, do not allow for a "partial fill". The law allows the pharmacy to complete that prescription within 72 hours, without requiring a new prescription. If they do not fill the rest of your prescription within those 72 hours, you will need a separate prescription to fill the remaining amount, which we will NOT provide. If the reason for the partial fill is your insurance, you will need to talk to the pharmacist about payment alternatives for  the remaining tablets, but again, DO NOT ACCEPT A PARTIAL FILL, unless you can trust your pharmacist to obtain the remainder of the pills within 72 hours.  Prescription refills and/or changes in medication(s):  Prescription refills, and/or changes in dose or medication, will be conducted only during scheduled medication management appointments. (Applies to both, written and electronic prescriptions.) No refills on procedure days. No medication will be changed or started on procedure days. No changes, adjustments, and/or refills will be conducted on a procedure day. Doing so will interfere with the diagnostic portion of the procedure. No phone refills. No medications will be "called into the pharmacy". No Fax refills. No weekend refills. No Holliday refills. No after hours refills.  Remember:  Business hours are:  Monday to Thursday 8:00 AM to 4:00 PM Provider's Schedule: Milinda Pointer, MD - Appointments are:  Medication management: Monday and Wednesday 8:00 AM to 4:00 PM Procedure day: Tuesday and Thursday 7:30 AM to 4:00 PM Gillis Santa, MD - Appointments are:  Medication management: Tuesday and Thursday 8:00 AM to 4:00 PM Procedure day: Monday and Wednesday 7:30 AM to 4:00 PM (Last update: 08/15/2022) ______________________________________________________________________    ____________________________________________________________________________________________  WARNING: CBD (cannabidiol) & Delta (Delta-8 tetrahydrocannabinol) products.   Applicable to:  All individuals currently taking or considering taking CBD (cannabidiol) and, more important, all patients taking opioid analgesic controlled substances (pain medication). (Example: oxycodone; oxymorphone; hydrocodone; hydromorphone; morphine; methadone; tramadol; tapentadol; fentanyl; buprenorphine; butorphanol; dextromethorphan; meperidine; codeine; etc.)  Introduction:  Recently there has been a drive towards the use of  "natural" products for the treatment of different conditions, including pain anxiety and sleep disorders. Marijuana and hemp are two varieties of the cannabis genus plants. Marijuana and its derivatives are illegal, while hemp and its derivatives are not. Cannabidiol (CBD) and tetrahydrocannabinol (THC), are two natural compounds found in plants of the Cannabis genus. They can both be extracted from hemp or marijuana. Both compounds interact with your body's endocannabinoid system in very different ways. CBD is associated with pain relief (analgesia) while THC is associated with the psychoactive effects ("the high") obtained from the use of marijuana products. There are two main types of THC: Delta-9, which comes from the marijuana plant and it is illegal, and Delta-8, which comes from the hemp plant, and it is legal. (Both,  Delta-9-THC and Delta-8-THC are psychoactive and give you "the high".)   Legality:  Marijuana and its derivatives: illegal Hemp and its derivatives: Legal (State dependent) UPDATE: (12/09/2021) The Drug Enforcement Agency ([place]) issued a letter stating that "delta" cannabinoids, including Delta-8-THCO and Delta-9-THCO, synthetically derived from hemp do not qualify as hemp and will be viewed as Schedule I drugs. (Schedule I drugs, substances, or chemicals are defined as drugs with no currently accepted medical use and a high potential for abuse. Some examples of Schedule I drugs are: heroin, lysergic acid diethylamide (LSD), marijuana (cannabis), 3,4-methylenedioxymethamphetamine (ecstasy), methaqualone, and peyote.) (https://jennings.com/)  Legal status of CBD in [place]:  "Conditionally Legal"  Reference: "FDA Regulation of Cannabis and Cannabis-Derived Products, Including Cannabidiol (CBD)" - [employer].pt  Warning:  CBD is not FDA approved and has not undergo the same  manufacturing controls as prescription drugs.  This means that the purity and safety of available CBD may be questionable. Most of the time, despite manufacturer's claims, it is contaminated with THC (delta-9-tetrahydrocannabinol - the chemical in marijuana responsible for the "HIGH").  When this is the case, the [facility] contaminant will trigger a positive urine drug screen (UDS) test for Marijuana (carboxy-THC).   The FDA recently put out a warning about 5 things that everyone should be aware of regarding Delta-8 THC: Delta-8 THC products have not been evaluated or approved by the FDA for safe use and may be marketed in ways that put the public health at risk. The FDA has received adverse event reports involving delta-8 THC-containing products. Delta-8 THC has psychoactive and intoxicating effects. Delta-8 THC manufacturing often involve use of potentially harmful chemicals to create the concentrations of delta-8 THC claimed in the marketplace. The final delta-8 THC product may have potentially harmful by-products (contaminants) due to the chemicals used in the process. Manufacturing of delta-8 THC products may occur in uncontrolled or unsanitary settings, which may lead to the presence of unsafe contaminants or other potentially harmful substances. Delta-8 THC products should be kept out of the reach of children and pets.  NOTE: Because a positive UDS for any illicit substance is a violation of our medication agreement, your opioid analgesics (pain medicine) may be permanently discontinued.  MORE ABOUT CBD  General Information: CBD was discovered in [birth year] and it is a derivative of the cannabis sativa genus plants (Marijuana and Hemp). It is one of the 113 identified substances found in Marijuana. It accounts for up to 40% of the plant's extract. As of 2018, preliminary clinical studies on CBD included research for the treatment of anxiety, movement disorders, and pain. CBD is available and consumed in  multiple forms, including inhalation of smoke or vapor, as an aerosol spray, and by mouth. It may be supplied as an oil containing CBD, capsules, dried cannabis, or as a liquid solution. CBD is thought not to be as psychoactive as THC (delta-9-tetrahydrocannabinol - the chemical in marijuana responsible for the "HIGH"). Studies suggest that CBD may interact with different biological target receptors in the body, including cannabinoid and other neurotransmitter receptors. As of 2018 the mechanism of action for its biological effects has not been determined.  Side-effects  Adverse reactions: Dry mouth, diarrhea, decreased appetite, fatigue, drowsiness, malaise, weakness, sleep disturbances, and others.  Drug interactions:  CBD may interact with medications such as blood-thinners. CBD causes drowsiness on its own and it will increase drowsiness caused by other medications, including antihistamines (such as Benadryl), benzodiazepines (Xanax, Ativan, Valium), antipsychotics, antidepressants, opioids, alcohol and supplements such as kava,  melatonin and St. John's Wort.  Other drug interactions: Brivaracetam (Briviact); Caffeine; Carbamazepine (Tegretol); Citalopram (Celexa); Clobazam (Onfi); Eslicarbazepine (Aptiom); Everolimus (Zostress); Lithium; Methadone (Dolophine); Rufinamide (Banzel); Sedative medications (CNS depressants); Sirolimus (Rapamune); Stiripentol (Diacomit); Tacrolimus (Prograf); Tamoxifen ; Soltamox); Topiramate (Topamax); Valproate; Warfarin (Coumadin); Zonisamide. (Last update: 10/02/2022) ____________________________________________________________________________________________

## 2022-10-03 NOTE — Telephone Encounter (Signed)
Spoke to pt in regards to her message and she just did not understand what was going on because it had never happened before. I explained it to her pt verbalized understanding.

## 2022-10-03 NOTE — Progress Notes (Signed)
PROVIDER NOTE: Information contained herein reflects review and annotations entered in association with encounter. Interpretation of such information and data should be left to medically-trained personnel. Information provided to patient can be located elsewhere in the medical record under "Patient Instructions". Document created using STT-dictation technology, any transcriptional errors that may result from process are unintentional.    Patient: Tricia Berry  Service Category: E/M  Provider: Gaspar Cola, MD  DOB: 09-13-1971  DOS: 10/03/2022  Referring Provider: Burnard Hawthorne, FNP  MRN: [id number]  Specialty: Interventional Pain Management  PCP: Burnard Hawthorne, FNP  Type: Established Patient  Setting: Ambulatory outpatient    Location: Office  Delivery: Face-to-face     HPI  Ms. AALIYAH GAVEL, a 51 y.o. year old female, is here today because of her Chronic bilateral low back pain without sciatica [M54.50, G89.29]. Ms. Pearcy's primary complain today is Back Pain Last encounter: My last encounter with her was on 09/19/2022. Pertinent problems: Ms. Middendorf has Osteoarthrosis, hand; Arthralgia; Sprain of lumbar region; Malignant neoplasm of upper-outer quadrant of left breast in female, estrogen receptor positive ([place]); Chronic low back pain (1ry area of Pain) (Bilateral) (R>L) w/o sciatica; 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[place]); Chronic pain syndrome; History of breast cancer (Left); History of mastectomy (Bilateral); Chronic calf pain (2ry area of Pain) (Bilateral); Chronic lower extremity pain (3ry area of Pain) (Bilateral); Chronic feet pain (Bilateral); Chronic upper extremity pain (4th area of Pain) (Bilateral) (L>R); Chronic hand pain (Bilateral) (L>R); Chronic generalized pain (5th area of Pain); Abnormal MRI, lumbar spine (10/13/2021); Lumbosacral facet arthropathy (Multilevel) (Bilateral); 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 Pain Assessment: Severity of Chronic pain is reported as a 6 /10. Location: Back Lower, Right, Left/right side buttocks. Onset: More than a month ago. Quality: Constant, Aching, Sharp. Timing: Constant. Modifying factor(s): Medication helps some. Vitals:  height is 5' (1.524 m) and weight is 119 lb (54 kg). Her temporal temperature is 97.7 F (36.5 C). Her blood pressure is 124/86 and her pulse is 83. Her respiration is 18 and oxygen saturation is 99%.  BMI: Estimated body mass index is 23.24 kg/m as calculated from the following:   Height as of this encounter: 5' (1.524 m).   Weight as of this encounter: 119 lb (54 kg).  Reason for encounter: post-procedure evaluation and assessment.  The patient indicates having attained 100% relief of the pain for the duration of the local anesthetic followed by an ongoing 50% relief in the left lower back and 0 long-term relief from the right side.  Physical exam today showed the patient to have a positive hyperextension and rotation maneuver with pain reproduction of point going towards the left side with some discomfort going towards the right but not as much.  Sacroiliac joint provocative testing and Patrick maneuver were positive for left hip arthralgia and bilateral sacroiliac joint pain with the left side being positive on several of the provocative test while the right side was not.  After I completed the testing, the patient commented that she had been under the impression that the left side was better than the right, but upon doing the physical exam, she has realized  that the left is worse  than the right.  The patient today requested to have her medications refilled today.  She also requested that we go up on the oxycodone from 5 mg to 10 mg.  This request was denied since the patient has continued to take lorazepam 0.5 mg p.o. daily as well as codeine/guaifenesin and pregabalin.  I will concentrate on the interventional management and hopefully begin to bring her off of the opioid analgesics.  Post-procedure evaluation   Type: Lumbar Facet, Medial Branch Block(s) #1  Laterality: Bilateral  Level: L3, L4, L5, & S1 Medial Branch Level(s). Injecting these levels blocks the L4-5 and L5-S1 lumbar facet joints. Imaging: Fluoroscopic guidance         Anesthesia: Local anesthesia (1-2% Lidocaine) Anxiolysis: IV Versed 3.0 mg Sedation: Moderate Sedation Fentanyl 1 mL (50 mcg) DOS: 09/19/2022 Performed by: Gaspar Cola, MD  Primary Purpose: Diagnostic/Therapeutic Indications: Low back pain severe enough to impact quality of life or function. 1. Chronic low back pain (1ry area of Pain) (Bilateral) (R>L) w/o sciatica   2. Lumbosacral facet syndrome (Bilateral)   3. 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 Lumbosacral facet arthropathy (Multilevel) (Bilateral)   5. Chronic lower extremity pain (3ry area of Pain) (Bilateral)    NAS-11 Pain score:   Pre-procedure: 4 /10   Post-procedure: 0-No pain/10      Effectiveness:  Initial hour after procedure: 100 %. Subsequent 4-6 hours post-procedure: 100 %. Analgesia past initial 6 hours:  (left side 50% relief; Right side 0% relief). Ongoing improvement:  Analgesic: The patient indicates continued to have an ongoing 50% improvement of the left side but on the right side he is back to baseline. Function: Somewhat improved ROM: Somewhat improved.   Sacroiliac Joint Evaluation  Inspection: No masses, redness, or swelling. Alignment: Symmetrical         Pain Pattern:  Articular.   Sacroiliac Provocative Tests Item Cluster: Palpation (Standing or sitting. PSIS pressure.) (Sensitive: 95% ):  Result: (+) sacral sulcus tenderness predominantly left  Distraction test (anterior) (Supine. Downward pressure over bilateral ASIS) (Reliability: 65.4%  Sensitivity: 25.75%  Specificity: 88.2%  (PPV): 60%  (NPV): 81%):  Right: (-)(negative)         Left: (+)(positive) Replication of patient's symptoms on left side.  Thigh-thrust test (Supine. LE flexed on tested side.) (Reliability: 74%  Sensitivity: 88%  Specificity: 75%  Positive predictive value (PPV): 58%  Negative predictive value (NPV): 92%):  Right-side test: (-)(negative) Asymptomatic Left-side test: (+)(positive) Reproduction of  buttock's pain on left side.  Gaenslen's maneuver (Supine. Tested side held hyperextended while flexing opposite.) (Sensitivity: 50-53%  Specificity: %  Positive predictive value (PPV): 47-50%  Negative predictive value (NPV): 76-77%):  Right-side test: ((-)(negative) Asymptomatic Left-side test: (+)(positive)   positive for pain in the left hip but not the sacroiliac joint.    Patrick's maneuver (FABER: Supine. Leg Flexion, ABduction and External Rotation) (Sensitivity: 69%  Specificity: 100%  (PPV): 100%  (NPV): 9%):  Right: (+)(positive) for right-sided S-I arthralgia         Left: (+)(positive) for left-sided S-I arthralgia Replication of patient's symptoms on left side.  This was also positive on the left side for left hip arthralgia with pain in the left groin area and decreased range of motion of the left hip.  Compression test (Lateral decubitus. Symptomatic side up.) (Reliability: 84%  Sensitivity: 100%  Specificity: 89%  (PPV): 52%  (NPV): 82%):  Right: (-)(negative)  Left: (+)(positive) Reproduction of patient's symptoms on left side.    Pharmacotherapy Assessment  Analgesic: Oxycodone IR 10 mg tablet, 1 tab p.o. daily (# 30) (last filled on  06/19/2022) MME/day: 15 mg/day   Monitoring: [place] PMP: PDMP reviewed during this encounter.       Pharmacotherapy: No side-effects or adverse reactions reported. Compliance: No problems identified. Effectiveness: Clinically acceptable.  Al Decant, RN  10/03/2022  1:40 PM  Sign when Signing Visit  Safety precautions to be maintained throughout the outpatient stay will include: orient to surroundings, keep bed in low position, maintain call bell within reach at all times, provide assistance with transfer out of bed and ambulation.   Nursing Pain Medication Assessment:  Safety precautions to be maintained throughout the outpatient stay will include: orient to surroundings, keep bed in low position, maintain call bell within reach at all times, provide assistance with transfer out of bed and ambulation.  Medication Inspection Compliance: Pill count conducted under aseptic conditions, in front of the patient. Neither the pills nor the bottle was removed from the patient's sight at any time. Once count was completed pills were immediately returned to the patient in their original bottle.  Medication: Oxycodne 41m Pill/Patch Count:  33 of 60 pills remain Pill/Patch Appearance: Markings consistent with prescribed medication Bottle Appearance: Standard pharmacy container. Clearly labeled. Filled Date: 186/ 27 / 2023 Last Medication intake:  Today    No results found for: "CBDTHCR" No results found for: "D8THCCBX" No results found for: "D9THCCBX"  UDS:  Summary  Date Value Ref Range Status  05/29/2022 Note  Final    Comment:    ==================================================================== Compliance Drug Analysis, Ur ==================================================================== Test                             Result       Flag       Units  Drug Present and Declared for Prescription Verification   Lorazepam                      1231         EXPECTED   ng/mg creat    Source  of lorazepam is a scheduled prescription medication.    Oxycodone                      1391         EXPECTED   ng/mg creat   Noroxycodone                   5467         EXPECTED   ng/mg creat    Sources of oxycodone include scheduled prescription medications.    Noroxycodone is an expected metabolite of oxycodone.    Pregabalin                     PRESENT      EXPECTED   Fluoxetine                     PRESENT      EXPECTED   Norfluoxetine                  PRESENT      EXPECTED    Norfluoxetine is an expected metabolite of fluoxetine.  Drug Present not Declared for Prescription Verification   Oxazepam  376          UNEXPECTED ng/mg creat   Temazepam                      41           UNEXPECTED ng/mg creat    Oxazepam and temazepam are expected metabolites of diazepam.    Oxazepam is also an expected metabolite of other benzodiazepine    drugs, including chlordiazepoxide, prazepam, clorazepate, halazepam,    and temazepam.  Oxazepam and temazepam are available as scheduled    prescription medications.    Acetaminophen                  PRESENT      UNEXPECTED  Drug Absent but Declared for Prescription Verification   Tizanidine                     Not Detected UNEXPECTED    Tizanidine, as indicated in the declared medication list, is not    always detected even when used as directed.    Prochlorperazine               Not Detected UNEXPECTED   Lidocaine                      Not Detected UNEXPECTED    Lidocaine, as indicated in the declared medication list, is not    always detected even when used as directed.  ==================================================================== Test                      Result    Flag   Units      Ref Range   Creatinine              54               mg/dL      >=20 ==================================================================== Declared Medications:  The flagging and interpretation on this report are based on the  following  declared medications.  Unexpected results may arise from  inaccuracies in the declared medications.   **Note: The testing scope of this panel includes these medications:   Fluoxetine (Prozac)  Lorazepam (Ativan)  Oxycodone  Pregabalin (Lyrica)  Prochlorperazine (Compazine)   **Note: The testing scope of this panel does not include small to  moderate amounts of these reported medications:   Tizanidine (Zanaflex)  Topical Lidocaine (EMLA)   **Note: The testing scope of this panel does not include the  following reported medications:   Abemaciclib (Verzenio)  Anastrozole (Arimidex)  Calcium  Multivitamin  Nicotine  Ondansetron (Zofran)  Prilocaine (EMLA)  Probiotic  Rosuvastatin (Crestor)  Supplement  Trimethoprim  Vitamin B12  Vitamin D ==================================================================== For clinical consultation, please call [phone]. ====================================================================       ROS  Constitutional: Denies any fever or chills Gastrointestinal: No reported hemesis, hematochezia, vomiting, or acute GI distress Musculoskeletal: Denies any acute onset joint swelling, redness, loss of ROM, or weakness Neurological: No reported episodes of acute onset apraxia, aphasia, dysarthria, agnosia, amnesia, paralysis, loss of coordination, or loss of consciousness  Medication Review  Budeson-Glycopyrrol-Formoterol, FLUoxetine, LORazepam, Lactobacillus, Multiple Vitamins-Minerals, albuterol, anastrozole, baclofen, cyanocobalamin, gentamicin ointment, naloxone, nicotine, oxyCODONE, pregabalin, and trimethoprim  History Review  Allergy: Ms. Christon is allergic to morphine, sertraline hcl, sulfa antibiotics, sulfamethoxazole, and sulfonamide derivatives. Drug: Ms. Buzzelli  reports no history of drug use. Alcohol:  reports that she does not currently use alcohol.  Tobacco:  reports that she has been smoking cigarettes. She has been  smoking an average of .25 packs per day. She has never used smokeless tobacco. Social: Ms. Udovich  reports that she has been smoking cigarettes. She has been smoking an average of .25 packs per day. She has never used smokeless tobacco. She reports that she does not currently use alcohol. She reports that she does not use drugs. Medical:  has a past medical history of Anxiety, Breast cancer ([place]), Diverticulitis, Family history of ovarian cancer, GERD (gastroesophageal reflux disease), IBS (irritable bowel syndrome), PONV (postoperative nausea and vomiting), and Scoliosis. Surgical: Ms. Dhingra  has a past surgical history that includes Abdominal hysterectomy; Breast biopsy (Left, 09/15/2020); Breast biopsy (Left, 09/15/2020); IR IMAGING GUIDED PORT INSERTION (10/01/2020); Modified mastectomy (Left, 03/17/2021); Total mastectomy (Right, 03/17/2021); PORTA CATH REMOVAL (Right, 03/17/2021); Breast reconstruction with placement of tissue expander and flex hd (acellular hydrated dermis) (Bilateral, 03/17/2021); Removal of bilateral tissue expanders with placement of bilateral breast implants (Bilateral, 05/09/2021); and Colonoscopy with propofol (N/A, 11/21/2021). Family: family history includes Diverticulitis in her brother and mother; Gout in her father; Heart attack (age of onset: 65) in her father; Heart failure in her father; Hypertension in her father and mother; Osteoarthritis in her mother; Ovarian cancer in her paternal grandmother.  Laboratory Chemistry Profile   Renal Lab Results  Component Value Date   BUN 8 09/12/2022   CREATININE 0.83 09/12/2022   GFR 90.14 09/08/2022   GFRAA >90 05/09/2012   GFRNONAA >60 09/12/2022    Hepatic Lab Results  Component Value Date   AST 31 09/12/2022   ALT 21 09/12/2022   ALBUMIN 3.8 09/12/2022   ALKPHOS 86 09/12/2022    Electrolytes Lab Results  Component Value Date   NA 136 09/12/2022   K 3.6 09/12/2022   CL 102 09/12/2022   CALCIUM 9.2 09/12/2022    MG 1.9 09/12/2022    Bone Lab Results  Component Value Date   25OHVITD1 38 05/29/2022   25OHVITD2 1.2 05/29/2022   25OHVITD3 37 05/29/2022    Inflammation (CRP: Acute Phase) (ESR: Chronic Phase) Lab Results  Component Value Date   CRP 5 05/29/2022   ESRSEDRATE CANCELED 05/29/2022         Note: Above Lab results reviewed.  Recent Imaging Review  DG PAIN CLINIC C-ARM 1-60 MIN NO REPORT Fluoro was used, but no Radiologist interpretation will be provided.  Please refer to "NOTES" tab for provider progress note. Note: Reviewed        Physical Exam  General appearance: Well nourished, well developed, and well hydrated. In no apparent acute distress Mental status: Alert, oriented x 3 (person, place, & time)       Respiratory: No evidence of acute respiratory distress Eyes: PERLA Vitals: BP 124/86   Pulse 83   Temp 97.7 F (36.5 C) (Temporal)   Resp 18   Ht 5' (1.524 m)   Wt 119 lb (54 kg)   SpO2 99%   BMI 23.24 kg/m  BMI: Estimated body mass index is 23.24 kg/m as calculated from the following:   Height as of this encounter: 5' (1.524 m).   Weight as of this encounter: 119 lb (54 kg). Ideal: Ideal body weight: 45.5 kg (100 lb 4.9 oz) Adjusted ideal body weight: 48.9 kg (107 lb 12.6 oz)  Assessment   Diagnosis Status  1. Chronic low back pain (1ry area of Pain) (Bilateral) (R>L) w/o sciatica   2. Lumbosacral facet syndrome (Bilateral)  3. Chronic lower extremity pain (3ry area of Pain) (Bilateral)   4. Chronic calf pain (2ry area of Pain) (Bilateral)   5. Chronic pain syndrome   6. Abnormal MRI, lumbar spine (10/13/2021)   7. Pharmacologic therapy   8. Lumbar nerve root impingement (Bilateral: L3-4, L5-S1) (Left: L4-5)   9. 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 Lumbosacral lateral recess stenosis (Left: L4-5) (Bilateral: L5-S1)   11. Chronic generalized pain (5th area of Pain)   12. Lumbosacral facet arthropathy (Multilevel)  (Bilateral)   13. Encounter for medication management   14. Encounter for chronic pain management   15. History of breast cancer (Left)   16. Chronic upper extremity pain (4th area of Pain) (Bilateral) (L>R)   17. Chronic use of opiate for therapeutic purpose   18. 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 Degenerative lumbar rotatory levoscoliosis   20. Chronic feet pain (Bilateral)   21. Chronic sacroiliac joint pain (Bilateral)   22. Chronic hip pain (Left)    Controlled Controlled Controlled   Updated Problems: Problem  Chronic sacroiliac joint pain (Bilateral)  Chronic hip pain (Left)    Plan of Care  Problem-specific:  No problem-specific Assessment & Plan notes found for this encounter.  Ms. MELLISA ARSHAD has a current medication list which includes the following long-term medication(s): albuterol, fluoxetine, pregabalin, and [START ON 10/18/2022] oxycodone.  Pharmacotherapy (Medications Ordered): Meds ordered this encounter  Medications   oxyCODONE (OXY IR/ROXICODONE) 5 MG immediate release tablet    Sig: Take 1 tablet (5 mg total) by mouth 2 (two) times daily. Must last 30 days.    Dispense:  60 tablet    Refill:  0    DO NOT: delete (not duplicate); no partial-fill (will deny script to complete), no refill request (F/U required). DISPENSE: 1 day early if closed on fill date. WARN: No CNS-depressants within 8 hrs of med.   Orders:  Orders Placed This Encounter  Procedures   DG Si Joints    Standing Status:   Future    Standing Expiration Date:   11/03/2022    Scheduling Instructions:     Imaging must be done as soon as possible. Inform patient that order will expire within 30 days and I will not renew it.    Order Specific Question:   Reason for Exam (SYMPTOM  OR DIAGNOSIS REQUIRED)    Answer:   Sacroiliac joint pain    Order Specific Question:   Is the patient pregnant?    Answer:   No    Order Specific Question:    Preferred imaging location?    Answer:   [facility]    Order Specific Question:   Call Results- Best Contact Number?    Answer:   [phone] ([facility])    Order Specific Question:   Release to patient    Answer:   Immediate   DG HIP UNILAT W OR W/O PELVIS 2-3 VIEWS LEFT    Please describe any evidence of DJD, such as joint narrowing, asymmetry, cysts, or any anomalies in bone density, production, or erosion.    Standing Status:   Future    Standing Expiration Date:   11/03/2022    Scheduling Instructions:     Imaging must be done as soon as possible. Inform patient that order will expire within 30 days and I will not renew it.    Order Specific Question:   Reason for  Exam (SYMPTOM  OR DIAGNOSIS REQUIRED)    Answer:   Sacroiliac joint pain    Order Specific Question:   Is the patient pregnant?    Answer:   No    Order Specific Question:   Preferred imaging location?    Answer:   [facility]    Order Specific Question:   Call Results- Best Contact Number?    Answer:   [phone] ([facility])    Order Specific Question:   Release to patient    Answer:   Immediate   Follow-up plan:   Return for [facility]): (L) IA Hip Inj #1.     Interventional Therapies  Risk  Complexity Considerations:   Estimated body mass index is 21.29 kg/m as calculated from the following:   Height as of this encounter: 5' (1.524 m).   Weight as of this encounter: 109 lb (49.4 kg). WNL   Planned  Pending:   Pending LE EMG/PNCV    Under consideration:   Diagnostic/therapeutic (Midline) caudal ESI + epidurogram #1  Diagnostic/therapeutic bilateral L3 transforaminal ESI #1  Diagnostic bilateral lumbar facet MBB (L2-S1) (to include the L3-4 level) #1    Completed:   Diagnostic bilateral lumbar facet MBB (L3-S1) x1 (09/19/2022) (4-0/10)    Completed by other providers:   Therapeutic bilateral L3, L4, and L5 MB RFA x1 (02/16/2022) by Sharlet Salina, DO  Diagnostic  bilateral L3-5 MBB x2 (12/30/2021; 01/17/2022) by Sharlet Salina, DO  Therapeutic right L5-S1 TESI + right S1 TESI x1 (11/29/2021) by Sharlet Salina, DO  Therapeutic sacral trigger point inj. 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    Therapeutic  Palliative (PRN) options:   None established      Recent Visits Date Type Provider Dept  09/19/22 Procedure visit Milinda Pointer, MD [facility]  09/13/22 Office Visit Milinda Pointer, MD [facility]  08/14/22 Office Visit Milinda Pointer, MD [facility]  07/05/22 Office Visit Milinda Pointer, MD [facility]  Showing recent visits within past 90 days and meeting all other requirements Today's Visits Date Type Provider Dept  10/03/22 Office Visit Milinda Pointer, MD [facility]  Showing today's visits and meeting all other requirements Future Appointments Date Type Provider Dept  11/15/22 Appointment Milinda Pointer, MD [facility]  Showing future appointments within next 90 days and meeting all other requirements  I discussed the assessment and treatment plan with the patient. The patient was provided an opportunity to ask questions and all were answered. The patient agreed with the plan and demonstrated an understanding of the instructions.  Patient advised to call back or seek an in-person evaluation if the symptoms or condition worsens.  Duration of encounter: 43 minutes.  Total time on encounter, as per AMA guidelines included both the face-to-face and non-face-to-face time personally spent by the physician and/or other qualified health care professional(s) on the day of the encounter (includes time in activities that require the physician or other qualified health care  professional and does not include time in activities normally performed by clinical staff). Physician's time may include the following activities when performed: preparing to see the patient (eg, review of tests, pre-charting review of records) obtaining and/or reviewing separately obtained history performing a medically appropriate examination and/or evaluation counseling and educating the patient/family/caregiver ordering medications,  tests, or procedures referring and communicating with other health care professionals (when not separately reported) documenting clinical information in the electronic or other health record independently interpreting results (not separately reported) and communicating results to the patient/ family/caregiver care coordination (not separately reported)  Note by: Gaspar Cola, MD Date: 10/03/2022; Time: 2:17 PM

## 2022-10-03 NOTE — Progress Notes (Signed)
  Safety precautions to be maintained throughout the outpatient stay will include: orient to surroundings, keep bed in low position, maintain call bell within reach at all times, provide assistance with transfer out of bed and ambulation.   Nursing Pain Medication Assessment:  Safety precautions to be maintained throughout the outpatient stay will include: orient to surroundings, keep bed in low position, maintain call bell within reach at all times, provide assistance with transfer out of bed and ambulation.  Medication Inspection Compliance: Pill count conducted under aseptic conditions, in front of the patient. Neither the pills nor the bottle was removed from the patient's sight at any time. Once count was completed pills were immediately returned to the patient in their original bottle.  Medication: Oxycodne 5mg  Pill/Patch Count:  33 of 60 pills remain Pill/Patch Appearance: Markings consistent with prescribed medication Bottle Appearance: Standard pharmacy container. Clearly labeled. Filled Date: 49 / 27 / 2023 Last Medication intake:  Today

## 2022-10-04 ENCOUNTER — Other Ambulatory Visit: Payer: Self-pay

## 2022-10-04 MED FILL — Pregabalin Cap 150 MG: ORAL | 30 days supply | Qty: 90 | Fill #0 | Status: AC

## 2022-10-05 ENCOUNTER — Ambulatory Visit
Admission: RE | Admit: 2022-10-05 | Discharge: 2022-10-05 | Disposition: A | Discharge status: Home / Self Care | Payer: No Typology Code available for payment source | Source: Home / Self Care | Attending: Pain Medicine | Admitting: Pain Medicine

## 2022-10-05 ENCOUNTER — Ambulatory Visit
Admission: RE | Admit: 2022-10-05 | Discharge: 2022-10-05 | Disposition: A | Discharge status: Home / Self Care | Payer: No Typology Code available for payment source | Source: Ambulatory Visit | Attending: Pain Medicine | Admitting: Pain Medicine

## 2022-10-05 ENCOUNTER — Ambulatory Visit: Payer: No Typology Code available for payment source | Attending: Nurse Practitioner | Admitting: Occupational Therapy

## 2022-10-05 ENCOUNTER — Encounter: Payer: Self-pay | Admitting: Occupational Therapy

## 2022-10-05 DIAGNOSIS — G8929 Other chronic pain: Secondary | ICD-10-CM

## 2022-10-05 DIAGNOSIS — I972 Postmastectomy lymphedema syndrome: Secondary | ICD-10-CM | POA: Insufficient documentation

## 2022-10-05 DIAGNOSIS — M25552 Pain in left hip: Secondary | ICD-10-CM | POA: Diagnosis present

## 2022-10-05 DIAGNOSIS — M533 Sacrococcygeal disorders, not elsewhere classified: Secondary | ICD-10-CM | POA: Insufficient documentation

## 2022-10-05 NOTE — Therapy (Signed)
OUTPATIENT OT UPPER EXTREMITY LYMPHEDEMA EVALUATION  Patient Name: NASIRA JANUSZ MRN: [id number] DOB:October 21, 1971, 51 y.o., female Today's Date: 10/05/2022    OT End of Session - 10/05/22 1139     Visit Number 4    Number of Visits 36    Date for OT Re-Evaluation 11/20/22    OT Start Time 0900    OT Stop Time 1005    OT Time Calculation (min) 65 min    Activity Tolerance Patient tolerated treatment well;No increased pain    Behavior During Therapy WFL for tasks assessed/performed              Past Medical History:  Diagnosis Date   Anxiety    Breast cancer ([place])    Diverticulitis    Family history of ovarian cancer    GERD (gastroesophageal reflux disease)    IBS (irritable bowel syndrome)    PONV (postoperative nausea and vomiting)    severe migraine and vomiting post anesthesia   Scoliosis    Past Surgical History:  Procedure Laterality Date   ABDOMINAL HYSTERECTOMY     still has ovaries, no gyn cancer, hysterectomy due to endometriosis. NO cervix on exam 01/10/21   BREAST BIOPSY Left 09/15/2020   [place] bx of mass, path pending, Q marker   BREAST BIOPSY Left 09/15/2020   [place] bx of LN, hydromarker, path pending   BREAST RECONSTRUCTION WITH PLACEMENT OF TISSUE EXPANDER AND FLEX HD (ACELLULAR HYDRATED DERMIS) Bilateral 03/17/2021   Procedure: IMMEDIATE BILATERAL BREAST RECONSTRUCTION WITH PLACEMENT OF TISSUE EXPANDER AND FLEX HD (ACELLULAR HYDRATED DERMIS);  Surgeon: Wallace Going, DO;  Location: [facility];  Service: Plastics;  Laterality: Bilateral;   COLONOSCOPY WITH PROPOFOL N/A 11/21/2021   Procedure: COLONOSCOPY WITH PROPOFOL;  Surgeon: Lin Landsman, MD;  Location: [facility];  Service: Gastroenterology;  Laterality: N/A;   IR IMAGING GUIDED PORT INSERTION  10/01/2020   MODIFIED MASTECTOMY Left 03/17/2021   Procedure: LEFT MODIFIED RADICAL MASTECTOMY;  Surgeon: Erroll Luna, MD;  Location: [facility];  Service:  General;  Laterality: Left;   PORTA CATH REMOVAL Right 03/17/2021   Procedure: PORTA CATH REMOVAL;  Surgeon: Erroll Luna, MD;  Location: [place];  Service: General;  Laterality: Right;   REMOVAL OF BILATERAL TISSUE EXPANDERS WITH PLACEMENT OF BILATERAL BREAST IMPLANTS Bilateral 05/09/2021   Procedure: REMOVAL OF BILATERAL TISSUE EXPANDERS WITH PLACEMENT OF BILATERAL BREAST IMPLANTS;  Surgeon: Wallace Going, DO;  Location: [place];  Service: Plastics;  Laterality: Bilateral;  90 min   TOTAL MASTECTOMY Right 03/17/2021   Procedure: TOTAL MASTECTOMY;  Surgeon: Erroll Luna, MD;  Location: [place];  Service: General;  Laterality: Right;   Patient Active Problem List   Diagnosis Date Noted   Chronic sacroiliac joint pain (Bilateral) 10/03/2022    Class: Chronic   Chronic hip pain (Left) 10/03/2022    Class: Chronic   CAP (community acquired pneumonia) 08/29/2022   Hypoxia 08/29/2022   Breast asymmetry following reconstructive surgery 08/15/2022   Lumbosacral facet arthropathy (Multilevel) (Bilateral) 07/05/2022   Lumbar spinal facet joint arthropathy with concurrent effusion (L3-4) (Right) 07/05/2022   Degenerative lumbar rotatory levoscoliosis 07/05/2022   Lumbosacral foraminal stenosis (Bilateral: L3-4) (Left: L4-5, L5-S1) 07/05/2022   Lumbosacral lateral recess stenosis (Left: L4-5) (Bilateral: L5-S1) 07/05/2022   Lumbar nerve root impingement (Bilateral: L3-4, L5-S1) (Left: L4-5) 07/05/2022   Long term prescription benzodiazepine use 07/05/2022   Chronic use of opiate for therapeutic purpose 07/05/2022  Lumbosacral facet syndrome (Bilateral) 07/05/2022   Pain medication agreement signed (12/07/21) 05/29/2022   History of breast cancer (Left) 05/29/2022   History of mastectomy (Bilateral) 05/29/2022   Chronic calf pain (2ry area of Pain) (Bilateral) 05/29/2022   Chronic lower extremity pain (3ry area of Pain) (Bilateral)  05/29/2022   Chronic feet pain (Bilateral) 05/29/2022   Chronic upper extremity pain (4th area of Pain) (Bilateral) (L>R) 05/29/2022   Chronic hand pain (Bilateral) (L>R) 05/29/2022   Chronic generalized pain (5th area of Pain) 05/29/2022   Abnormal MRI, lumbar spine (10/13/2021) 05/29/2022   Chronic pain syndrome 05/28/2022   Pharmacologic therapy 05/28/2022   Disorder of skeletal system 05/28/2022   Problems influencing health status 05/28/2022   Family history of heart disease 11/16/2021   Acquired absence of breast 08/16/2021   B12 deficiency 07/21/2021   Atherosclerosis of aorta ([place]) 07/20/2021   Hepatic steatosis 07/20/2021   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[place]) 03/14/2021   Genetic testing 11/22/2020   Family history of ovarian cancer    Candidal vulvovaginitis 11/01/2020   Chronic low back pain (1ry area of Pain) (Bilateral) (R>L) w/o sciatica 10/06/2020   Encounter for medical examination to establish care 09/26/2020   Malignant neoplasm of upper-outer quadrant of left breast in female, estrogen receptor positive ([place]) 09/26/2020   Sprain of lumbar region 03/08/2010   INTERNAL HEMORRHOIDS 01/27/2010   Anal fissure 01/27/2010   Osteoarthrosis, hand 01/04/2010   Arthralgia 10/05/2009   INSOMNIA, CHRONIC 05/06/2009   ALLERGIC RHINITIS, SEASONAL 02/09/2009   TOBACCO ABUSE 12/29/2008   IBS 10/13/2008   Anxiety state 08/25/2008   DEPRESSION, RECURRENT 08/25/2008    PCP: Burnard Hawthorne, FPN  REFERRING PROVIDER: Verlon Au, NP  REFERRING DIAG: 197.2  THERAPY DIAG:  LUE/ LUQ Post-mastectomy lymphedema syndrome  ONSET DATE: 03/17/21  Rationale for Evaluation and Treatment: Rehabilitation  SUBJECTIVE                                                                                                                                                                                           SUBJECTIVE STATEMENT:Hoang G Rome presents for OT to address L breast  cancer related lymphedema. Marland Kitchen LE-related pain is unchanged since last session. Pt has no new complaints.   PERTINENT HISTORY: L Breast Ca ER+, PR+, HER2-, stage III  L breast biopsy 09/15/20; Neoadjuvant chemotherapy; L modified radical mastectomy and R prophylactic total mastectomy with immediate placement of tissue expanders 03/17/21; L ALND 7+/17. XRT completed 02/18/21. Marland Kitchen  Upcoming R capsulectomy scheduled 09/07/22. Chronic pain syndrome, chronic insomnia, OA B hands, Tobacco abuse, IBS, Anxiety, Recurrent Depression   PAIN:  Are you  having pain? Yes NPRS scale: not rated numerically Pain location: L lateral trunk, axilla, volar arm and forearm, chest wall Pain orientation L PAIN TYPE: pulling, discomfort, fullness, tightness Pain description: intermittent Aggravating factors: end range shoulder AROM, lifting, carrying, stretching Relieving factors: MLD, compression  Are you having pain? Yes NPRS scale: 4/10 Pain location: lower back Pain orientation: Medial  PAIN TYPE: aching Pain description: constant  Aggravating factors: weight bearing Relieving factors: medication  Are you having pain? Yes NPRS scale: 4/10 Pain location: hands Pain orientation: bilateral PAIN TYPE: burning, shooting, pins and needles, stabbing Pain description: constant  Aggravating factors: unknown Relieving factors: medication  Are you having pain? Yes NPRS scale: 5/10 Pain location: toes Pain orientation: bilateral PAIN TYPE: burning, shooting, pins and needles, stabbing Pain description: constant  Aggravating factors: unknown Relieving factors: medication  PRECAUTIONS: Falls,  Lymphedema Precautions  HAND DOMINANCE : right   PRIOR LEVEL OF FUNCTION: Independent  PATIENT GOALS: make sure lymphedema doesn't get worse; reduce pain and discomfort in my hands and arm and trunk   OBJECTIVE   LYMPHEDEMA ASSESSMENTS:   FOTO FUNCTIONAL OUTCOME SCORE: Intake 59/100%  LYMPHEDEMA LIFE IMPACT  SCALE (LLIS) Intake TBA 1st Rx visit. Pt did not complete backside of page  TODAY'S TREATMENT:  Pt edu for compression garment needs, options, recommendations and ordering through DME vendor. Discussed roles of vendor, therapist and insurance billing. Assisted with [facility] application process. Manual therapy, including MLD and myofacial release to L UE/LUQ  PATIENT EDUCATION:  Education details: Pt edu focused on differences between custom  flat knit and off-the-shelf circular elastic compression garments.  Educated Pt re indications for both and pros and cons of each, and rational for therapist's current recommendations  Educated Pt re estimated costs, measuring and fitting process ,DME vendor's involvement and access to insurance benefits. 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 Ensure pad covers serratus anterior and L lateral trunk as well as chest wall. Pt is of petite stature so custom pas and sleeve may be required to achieve correct fit  ASSESSMENT:  CLINICAL IMPRESSION: Pt was last seen on 08/29/22. She missed several scheduled visits b/c she had the flu. Pt has not obtained recommended compression glove, sleeve, bras and foam inserts. She reports that 2 DME vendors have contacted her re garments, but she is confused about what to do. Reviewed compression garment process and role of DME vendor. Reviewed  reason this OT recommends custom garments is because even smallest size off-the-shelf garments are too large to fit her correctly. Pt verbalized understanding and OT assisted Pt with application for [facility]  funding to assist with funding recommended compression garments and devices. OT requested itemized work order from DME vendor after session . Pt will email financial documents from home to accompany application after session. Remainder of session focused on providing manual therapy to LUE/LUQ, MLD and myofascial release to decrease pain and inflammation 2/2 axillary web syndrome. No change observed at end of session. Pt tolerated manual modalities well without increased pain. Pt in agreement with plan to  participate in OT 1 x weekly until compression garments are fitted. She reports that attending OT for lymphedema care at [facility] costs more than at off campus [facility] facility in [facility].  Pt tolerated MLD and myofascial release techniques to L axilla , arm, and trunk. Painful axillary chording is visible and palpable after session. I could not feel any release of axillary web vessels with manual therapy.   Pt would like to continues  with manual therapy but is hoping she can return to the clinic she visited in [place] because there is no co pay at that facility. She reports she has a 40$ co pay at [facility] outpatient rehab facility, and this poses a significant hardship for her. We'll call her for garment fitting once her pieces have arrived from DME vendor. Pt given vendor contact info so she is able to check status on garments. I'm happy to assist with axillary web syndrome Rx PRN.   RECOMMENDED COMPRESSION: Custom Juzo, ccl 1 (20-30 mmHg) compression arm sleeve, x 2 Custom ccl 1 Juzo glove, x 2 JoviPak lumpectomy pad Belisse compression bras , x 2 Unable to fit with off the shelf compression arm sleeve as Pt is too petite to fit into manufacturer's size range for smallest  size 1   OBJECTIVE IMPAIRMENTS: decreased activity tolerance, decreased balance, decreased endurance, decreased knowledge of condition, decreased knowledge of use of DME, impaired LUE shoulder AROM, decreased strength, impaired flexibility, impaired sensation, impaired UE functional use, postural dysfunction, impaired dynamic balance, muscle weakness, pain, and increased lymphedema progression risk.   ACTIVITY LIMITATIONS: carrying, lifting, sleeping, bed mobility, reach over head, hygiene/grooming, caring for others, working as Marine scientist, driving, meal prep, cooking  PARTICIPATION LIMITATIONS: cleaning, laundry, driving, shopping, occupation, and yard work  PERSONAL FACTORS: 3+ comorbidities: chronic pain, lumbar stenosis, chemo-induced neuropathy in all 4 limbs  are also affecting patient's functional outcome.   REHAB POTENTIAL: Good for limiting LE progression   GOALS: Goals reviewed with patient? Yes  SHORT TERM GOALS: Target date: 4th OT Rx visit    Pt will demonstrate understanding of lymphedema precautions and prevention strategies with modified independence using a printed reference to identify at least 5 precautions and discussing how s/he may implement them into daily life to reduce risk of progression and to limit infection risk. Baseline:Max A Goal status: INITIAL   LONG TERM GOALS: Target date: 12/28/2022    Given this patient's Intake score of 59/100% on the functional outcomes FOTO tool, patient will experience an increase in function of 3 points  to improve basic and instrumental ADLs performance, including lymphedema self-care. Baseline: 59/100% Goal status: INITIAL  2.  Given this patient's Intake score of TBA/100% on the Lymphedema Life Impact Scale (LLIS), patient will experience an increase of 5 points in her perceived level of functional impairment resulting from lymphedema to improve functional performance and quality of life (QOL). Baseline: TBA Goal status:  INITIAL  3.  With assistive devices and modified independence (extra time) Pt will be able to don and doff appropriate compression garments and/or devices to control BLE lymphedema and to limit progression.  Baseline: Max A Goal status: INITIAL  4.  Pt will be able to correctly perform all lymphedema self-care home program components using correct techniques with extra time and assistive devices PRN (modified independence), including simple self MLD, lymphatic pumping exercise, don and doff appropriate compression garments/ devices, and perform daily skin care regime to limit progression. Baseline: Max A Goal status: INITIAL  PLAN:  PT FREQUENCY: 1-2x/week  PT DURATION: 12 weeks  PLANNED INTERVENTIONS: Therapeutic exercises, Therapeutic activity, Patient/Family education, Self Care, DME instructions, Manual lymph drainage, Taping, Manual therapy, and compression garment measurement , fitting and training  PLAN FOR NEXT SESSION:  Complete compression glove measurements Further assess AWS Have Pt complete LLIS Complete initial volumetrics using percentages and limb volume differential vs circumferences.  Andrey Spearman, MS, OTR/L, CLT-LANA 10/05/22 11:53 AM

## 2022-10-07 ENCOUNTER — Encounter: Payer: Self-pay | Admitting: Oncology

## 2022-10-09 ENCOUNTER — Encounter: Payer: No Typology Code available for payment source | Admitting: Student

## 2022-10-09 ENCOUNTER — Ambulatory Visit: Payer: No Typology Code available for payment source | Admitting: Occupational Therapy

## 2022-10-09 ENCOUNTER — Ambulatory Visit (INDEPENDENT_AMBULATORY_CARE_PROVIDER_SITE_OTHER): Payer: No Typology Code available for payment source | Admitting: Student

## 2022-10-09 ENCOUNTER — Encounter (HOSPITAL_BASED_OUTPATIENT_CLINIC_OR_DEPARTMENT_OTHER): Payer: Self-pay | Admitting: Plastic Surgery

## 2022-10-09 ENCOUNTER — Encounter: Payer: Self-pay | Admitting: Student

## 2022-10-09 ENCOUNTER — Other Ambulatory Visit: Payer: Self-pay

## 2022-10-09 VITALS — BP 126/81 | HR 86 | Ht 60.0 in | Wt 121.8 lb

## 2022-10-09 DIAGNOSIS — N651 Disproportion of reconstructed breast: Secondary | ICD-10-CM

## 2022-10-09 NOTE — H&P (View-Only) (Signed)
Patient ID: Tricia Berry, female    DOB: [birth year], 51 y.o.   MRN: [id number]  Chief Complaint  Patient presents with   Pre-op Exam      ICD-10-CM   1. Breast asymmetry following reconstructive surgery  N65.1        History of Present Illness: Tricia Berry is a 51 y.o.  female  with a history of breast cancer.  She presents for preoperative evaluation for upcoming procedure, right breast capsule release with adjustment, scheduled for 10/18/2022 with Dr. Marla Roe.  Patient reports that after her previous visit before her surgery, she was diagnosed with bilateral pneumonia.  She states that she has been treated for this and feels much better and back at her baseline.  Patient reports she sometimes gets nauseated with anesthesia.  She reports this is well-controlled with a scopolamine patch and Zofran.  She denies any other issues with anesthesia.  Patient denies any cardiac issues.  She states that she has seen a cardiologist in the past for an abnormal EKG, but has not had issues in a long time.  Patient reports she currently smokes a few cigarettes per day every so often.   Patient states that she is currently on anastrozole.  She denies being on any birth control.  She denies any history of miscarriages.  She denies any personal or family history of blood clots or clotting diseases.  She denies any recent surgeries, traumas, or hospitalizations.  She denies any history of stroke or heart attack.  She denies any history of Crohn's disease or ulcerative colitis.  She denies any history of COPD or asthma.  She has history of cancer.  She denies any history of swollen extremities or varicose veins.  She denies any recent fevers or chills.  She denies any recent changes in her health.  Patient reports she sees Dr. Dossie Arbour in [facility] for pain management.  She states that she had a letter from them stating that it was okay to go ahead and prescribe postoperative pain medications.   Patient states she forgot the letter today though.  She reports to look for it and try to get it to [place].  Summary of Previous Visit: Patient was seen in the clinic on 08/28/2022 for her initial preoperative visit.  At this visit, she was having cough and some shortness of breath.  Her oxygen saturation in the clinic was in the high 80s/low 90s, but improved and went up to 93/94%.  Patient was instructed to follow-up with her primary care's provider as soon as possible.  After some lab work and imaging, patient was found to have bilateral pneumonia.  Job: Not working at this time  [place] Significant for: Breast cancer, chronic pain   Past Medical History: Allergies: Allergies  Allergen Reactions   Morphine Nausea And Vomiting and Other (See Comments)    migranes Other reaction(s): Headache Migraine, vomiting   Sertraline Hcl     REACTION: Worsened symptoms of IBS   Sulfa Antibiotics Rash   Sulfamethoxazole Rash   Sulfonamide Derivatives Rash    Current Medications:  Current Outpatient Medications:    albuterol (VENTOLIN HFA) 108 (90 Base) MCG/ACT inhaler, Inhale 2 puffs into the lungs every 6 (six) hours as needed for wheezing or shortness of breath., Disp: 6.7 g, Rfl: 0   anastrozole (ARIMIDEX) 1 MG tablet, Take 1 tablet (1 mg total) by mouth daily., Disp: 30 tablet, Rfl: 5   baclofen (LIORESAL) 10 MG tablet, Take 1  tablet (10 mg total) by mouth 3 (three) times daily as needed for muscle spasms., Disp: 15 tablet, Rfl: 1   cyanocobalamin (VITAMIN B12) 1000 MCG/ML injection, 1000 mcg (1 mL) intramuscular injection in the thigh ( vastus lateralis) once per month., Disp: 3 mL, Rfl: 4   FLUoxetine (PROZAC) 20 MG capsule, Take 1 capsule (20 mg total) by mouth every morning., Disp: 90 capsule, Rfl: 3   Lactobacillus (PROBIOTIC ACIDOPHILUS PO), Take 1 capsule by mouth daily., Disp: , Rfl:    LORazepam (ATIVAN) 0.5 MG tablet, Take 1 tablet (0.5 mg total) by mouth at bedtime., Disp: 30 tablet, Rfl:  2   Multiple Vitamins-Minerals (MULTIVITAL PO), Take 1 Dose by mouth daily., Disp: , Rfl:    naloxone (NARCAN) nasal spray 4 mg/0.1 mL, Place 1 spray into the nose as needed for opioid-induced respiratory depresssion. In case of emergency (overdose), spray once into each nostril. If no response within 3 minutes, repeat application and call [phone]., Disp: 2 each, Rfl: 0   nicotine (NICODERM CQ - DOSED IN MG/24 HOURS) 21 mg/24hr patch, use 1 patch daily, Disp: 28 patch, Rfl: 0   [START ON 10/18/2022] oxyCODONE (OXY IR/ROXICODONE) 5 MG immediate release tablet, Take 1 tablet (5 mg total) by mouth 2 (two) times daily. Must last 30 days., Disp: 60 tablet, Rfl: 0   pregabalin (LYRICA) 150 MG capsule, Take 1 capsule (150 mg total) by mouth in the morning, at noon, and at bedtime., Disp: 90 capsule, Rfl: 2   trimethoprim (TRIMPEX) 100 MG tablet, Take 1 tablet (100 mg total) by mouth daily., Disp: 90 tablet, Rfl: 3 No current facility-administered medications for this visit.  Facility-Administered Medications Ordered in Other Visits:    acetaminophen (TYLENOL) 325 MG tablet, , , ,    diphenhydrAMINE (BENADRYL) 25 mg capsule, , , ,    goserelin (ZOLADEX) injection 3.6 mg, 3.6 mg, Subcutaneous, Q28 days, Sindy Guadeloupe, MD, 3.6 mg at 05/04/21 1150   heparin lock flush 100 unit/mL, 500 Units, Intravenous, Once, Sindy Guadeloupe, MD   Zoledronic Acid (ZOMETA) 4 MG/100ML IVPB, , , ,   Past Medical Problems: Past Medical History:  Diagnosis Date   Anxiety    Breast cancer ([place]) 12/2021   left breast IMC   Diverticulitis    Family history of ovarian cancer    GERD (gastroesophageal reflux disease)    IBS (irritable bowel syndrome)    Pneumonia    PONV (postoperative nausea and vomiting)    severe migraine and vomiting post anesthesia   Scoliosis     Past Surgical History: Past Surgical History:  Procedure Laterality Date   ABDOMINAL HYSTERECTOMY     still has ovaries, no gyn cancer, hysterectomy due  to endometriosis. NO cervix on exam 01/10/21   BREAST BIOPSY Left 09/15/2020   [place] bx of mass, path pending, Q marker   BREAST BIOPSY Left 09/15/2020   [place] bx of LN, hydromarker, path pending   BREAST RECONSTRUCTION WITH PLACEMENT OF TISSUE EXPANDER AND FLEX HD (ACELLULAR HYDRATED DERMIS) Bilateral 03/17/2021   Procedure: IMMEDIATE BILATERAL BREAST RECONSTRUCTION WITH PLACEMENT OF TISSUE EXPANDER AND FLEX HD (ACELLULAR HYDRATED DERMIS);  Surgeon: Wallace Going, DO;  Location: [place];  Service: Plastics;  Laterality: Bilateral;   COLONOSCOPY WITH PROPOFOL N/A 11/21/2021   Procedure: COLONOSCOPY WITH PROPOFOL;  Surgeon: Lin Landsman, MD;  Location: [facility];  Service: Gastroenterology;  Laterality: N/A;   IR IMAGING GUIDED PORT INSERTION  10/01/2020   MODIFIED MASTECTOMY  Left 03/17/2021   Procedure: LEFT MODIFIED RADICAL MASTECTOMY;  Surgeon: Erroll Luna, MD;  Location: [place];  Service: General;  Laterality: Left;   PORTA CATH REMOVAL Right 03/17/2021   Procedure: PORTA CATH REMOVAL;  Surgeon: Erroll Luna, MD;  Location: [place];  Service: General;  Laterality: Right;   REMOVAL OF BILATERAL TISSUE EXPANDERS WITH PLACEMENT OF BILATERAL BREAST IMPLANTS Bilateral 05/09/2021   Procedure: REMOVAL OF BILATERAL TISSUE EXPANDERS WITH PLACEMENT OF BILATERAL BREAST IMPLANTS;  Surgeon: Wallace Going, DO;  Location: [place];  Service: Plastics;  Laterality: Bilateral;  90 min   TOTAL MASTECTOMY Right 03/17/2021   Procedure: TOTAL MASTECTOMY;  Surgeon: Erroll Luna, MD;  Location: [place];  Service: General;  Laterality: Right;    Social History: Social History   Socioeconomic History   Marital status: Divorced    Spouse name: Not on file   Number of children: 2   Years of education: Not on file   Highest education level: Not on file  Occupational History   Occupation: Nurse     Employer: [employer]  Tobacco Use   Smoking status: Every Day    Packs/day: 0.50    Types: Cigarettes   Smokeless tobacco: Never   Tobacco comments:    Pt trying to quit now. Has reduced amount significantly  Vaping Use   Vaping Use: Never used  Substance and Sexual Activity   Alcohol use: Not Currently   Drug use: No   Sexual activity: Not Currently    Birth control/protection: Surgical    Comment: hyst  Other Topics Concern   Not on file  Social History Narrative   Patient works as an Warden/ranger at [employer]. She has 2 children at home who have special needs. She and her spouse are primary caregivers.    Social Determinants of Health   Financial Resource Strain: Not on file  Food Insecurity: Not on file  Transportation Needs: Not on file  Physical Activity: Not on file  Stress: Not on file  Social Connections: Not on file  Intimate Partner Violence: Not on file    Family History: Family History  Problem Relation Age of Onset   Hypertension Mother    Osteoarthritis Mother    Diverticulitis Mother    Heart failure Father    Hypertension Father    Gout Father    Heart attack Father 79   Diverticulitis Brother    Ovarian cancer Paternal Grandmother     Review of Systems: Denies fevers, chills or recent changes in health  Physical Exam: Vital Signs BP 126/81 (BP Location: Right Arm, Patient Position: Sitting, Cuff Size: Normal)   Pulse 86   Ht 5' (1.524 m)   Wt 121 lb 12.8 oz (55.2 kg)   SpO2 98%   BMI 23.79 kg/m   Physical Exam  Constitutional:      General: Not in acute distress.    Appearance: Normal appearance. Not ill-appearing.  HENT:     Head: Normocephalic and atraumatic.  Neck:     Musculoskeletal: Normal range of motion.  Cardiovascular:     Rate and Rhythm: Normal rate Pulmonary:     Effort: Pulmonary effort is normal. No respiratory distress.  Musculoskeletal: Normal range of motion.  Skin:    General: Skin is warm and dry.     Findings:  No erythema or rash.  Neurological:      Mental Status: Alert and oriented to person, place, and  time. Mental status is at baseline.  Psychiatric:        Mood and Affect: Mood normal.        Behavior: Behavior normal.    Assessment/Plan: The patient is scheduled for right breast capsule release with adjustment with Dr. Marla Roe.  Risks, benefits, and alternatives of procedure discussed, questions answered and consent obtained.    Smoking Status: Current smoker; Counseling Given?  I encouraged the patient that she should stop smoking now and continue to to not smoke throughout the preoperative and postoperative period.  I discussed with the patient that smoking can increase the risk of complications postoperatively.  Patient expressed understanding. Last Mammogram: 09/08/2020; Results: Highly suggestive of malignancy  Caprini Score: 7; Risk Factors include: Age, recent pneumonia, history of malignancy, history of smoking, and length of planned surgery. Recommendation for mechanical and possible pharmacologic prophylaxis. Encourage early ambulation.  I will discuss the possibility of Lovenox postoperatively with the surgical team.  Pictures obtained: 08/28/22  Surgical clearances to be sent to patient's Cardiologist Dr. Kate Sable, patient's PCP Mable Paris FNP and to patient's pain management provider Dr. Milinda Pointer for postoperative pain management recommendations.   Post-op Rx sent to pharmacy: Patient reports she already has Zofran at home and already has the antibiotic that was sent in at her last preoperative appointment and states that she can take these postoperatively.  I discussed with the patient that I will have to get clearance back from her pain management provider before I prescribe any pain medications for postoperatively.  Patient expressed understanding.  I instructed the patient to hold any multivitamins, vitamins or supplements 1 week prior to surgery.  I  discussed with the patient that she should hold her baclofen, Ativan and oxycodone the day of surgery.  Patient expressed understanding.  Patient was provided with the General Surgical Risk consent document and Pain Medication Agreement prior to their appointment.  They had adequate time to read through the risk consent documents and Pain Medication Agreement. We also discussed them in person together during this preop appointment. All of their questions were answered to their satisfaction.  Recommended calling if they have any further questions.  Risk consent form and Pain Medication Agreement to be scanned into patient's chart.  The consent was obtained with risks and complications reviewed which included bleeding, pain, scar, infection and the risk of anesthesia.  The patients questions were answered to the patients expressed satisfaction.    Electronically signed by: Clance Boll, PA-C 10/09/2022 4:41 PM

## 2022-10-09 NOTE — Progress Notes (Addendum)
Patient ID: Tricia Berry, female    DOB: 31-Jul-1971, 51 y.o.   MRN: [id number]  Chief Complaint  Patient presents with   Pre-op Exam      ICD-10-CM   1. Breast asymmetry following reconstructive surgery  N65.1        History of Present Illness: Tricia Berry is a 51 y.o.  female  with a history of breast cancer.  She presents for preoperative evaluation for upcoming procedure, right breast capsule release with adjustment, scheduled for 10/18/2022 with Dr. Marla Roe.  Patient reports that after her previous visit before her surgery, she was diagnosed with bilateral pneumonia.  She states that she has been treated for this and feels much better and back at her baseline.  Patient reports she sometimes gets nauseated with anesthesia.  She reports this is well-controlled with a scopolamine patch and Zofran.  She denies any other issues with anesthesia.  Patient denies any cardiac issues.  She states that she has seen a cardiologist in the past for an abnormal EKG, but has not had issues in a long time.  Patient reports she currently smokes a few cigarettes per day every so often.   Patient states that she is currently on anastrozole.  She denies being on any birth control.  She denies any history of miscarriages.  She denies any personal or family history of blood clots or clotting diseases.  She denies any recent surgeries, traumas, or hospitalizations.  She denies any history of stroke or heart attack.  She denies any history of Crohn's disease or ulcerative colitis.  She denies any history of COPD or asthma.  She has history of cancer.  She denies any history of swollen extremities or varicose veins.  She denies any recent fevers or chills.  She denies any recent changes in her health.  Patient reports she sees Dr. Dossie Arbour in [facility] for pain management.  She states that she had a letter from them stating that it was okay to go ahead and prescribe postoperative pain medications.   Patient states she forgot the letter today though.  She reports to look for it and try to get it to [place].  Summary of Previous Visit: Patient was seen in the clinic on 08/28/2022 for her initial preoperative visit.  At this visit, she was having cough and some shortness of breath.  Her oxygen saturation in the clinic was in the high 80s/low 90s, but improved and went up to 93/94%.  Patient was instructed to follow-up with her primary care's provider as soon as possible.  After some lab work and imaging, patient was found to have bilateral pneumonia.  Job: Not working at this time  [place] Significant for: Breast cancer, chronic pain   Past Medical History: Allergies: Allergies  Allergen Reactions   Morphine Nausea And Vomiting and Other (See Comments)    migranes Other reaction(s): Headache Migraine, vomiting   Sertraline Hcl     REACTION: Worsened symptoms of IBS   Sulfa Antibiotics Rash   Sulfamethoxazole Rash   Sulfonamide Derivatives Rash    Current Medications:  Current Outpatient Medications:    albuterol (VENTOLIN HFA) 108 (90 Base) MCG/ACT inhaler, Inhale 2 puffs into the lungs every 6 (six) hours as needed for wheezing or shortness of breath., Disp: 6.7 g, Rfl: 0   anastrozole (ARIMIDEX) 1 MG tablet, Take 1 tablet (1 mg total) by mouth daily., Disp: 30 tablet, Rfl: 5   baclofen (LIORESAL) 10 MG tablet, Take 1  tablet (10 mg total) by mouth 3 (three) times daily as needed for muscle spasms., Disp: 15 tablet, Rfl: 1   cyanocobalamin (VITAMIN B12) 1000 MCG/ML injection, 1000 mcg (1 mL) intramuscular injection in the thigh ( vastus lateralis) once per month., Disp: 3 mL, Rfl: 4   FLUoxetine (PROZAC) 20 MG capsule, Take 1 capsule (20 mg total) by mouth every morning., Disp: 90 capsule, Rfl: 3   Lactobacillus (PROBIOTIC ACIDOPHILUS PO), Take 1 capsule by mouth daily., Disp: , Rfl:    LORazepam (ATIVAN) 0.5 MG tablet, Take 1 tablet (0.5 mg total) by mouth at bedtime., Disp: 30 tablet, Rfl:  2   Multiple Vitamins-Minerals (MULTIVITAL PO), Take 1 Dose by mouth daily., Disp: , Rfl:    naloxone (NARCAN) nasal spray 4 mg/0.1 mL, Place 1 spray into the nose as needed for opioid-induced respiratory depresssion. In case of emergency (overdose), spray once into each nostril. If no response within 3 minutes, repeat application and call [phone]., Disp: 2 each, Rfl: 0   nicotine (NICODERM CQ - DOSED IN MG/24 HOURS) 21 mg/24hr patch, use 1 patch daily, Disp: 28 patch, Rfl: 0   [START ON 10/18/2022] oxyCODONE (OXY IR/ROXICODONE) 5 MG immediate release tablet, Take 1 tablet (5 mg total) by mouth 2 (two) times daily. Must last 30 days., Disp: 60 tablet, Rfl: 0   pregabalin (LYRICA) 150 MG capsule, Take 1 capsule (150 mg total) by mouth in the morning, at noon, and at bedtime., Disp: 90 capsule, Rfl: 2   trimethoprim (TRIMPEX) 100 MG tablet, Take 1 tablet (100 mg total) by mouth daily., Disp: 90 tablet, Rfl: 3 No current facility-administered medications for this visit.  Facility-Administered Medications Ordered in Other Visits:    acetaminophen (TYLENOL) 325 MG tablet, , , ,    diphenhydrAMINE (BENADRYL) 25 mg capsule, , , ,    goserelin (ZOLADEX) injection 3.6 mg, 3.6 mg, Subcutaneous, Q28 days, Sindy Guadeloupe, MD, 3.6 mg at 05/04/21 1150   heparin lock flush 100 unit/mL, 500 Units, Intravenous, Once, Sindy Guadeloupe, MD   Zoledronic Acid (ZOMETA) 4 MG/100ML IVPB, , , ,   Past Medical Problems: Past Medical History:  Diagnosis Date   Anxiety    Breast cancer ([place]) 12/2021   left breast IMC   Diverticulitis    Family history of ovarian cancer    GERD (gastroesophageal reflux disease)    IBS (irritable bowel syndrome)    Pneumonia    PONV (postoperative nausea and vomiting)    severe migraine and vomiting post anesthesia   Scoliosis     Past Surgical History: Past Surgical History:  Procedure Laterality Date   ABDOMINAL HYSTERECTOMY     still has ovaries, no gyn cancer, hysterectomy due  to endometriosis. NO cervix on exam 01/10/21   BREAST BIOPSY Left 09/15/2020   [place] bx of mass, path pending, Q marker   BREAST BIOPSY Left 09/15/2020   [place] bx of LN, hydromarker, path pending   BREAST RECONSTRUCTION WITH PLACEMENT OF TISSUE EXPANDER AND FLEX HD (ACELLULAR HYDRATED DERMIS) Bilateral 03/17/2021   Procedure: IMMEDIATE BILATERAL BREAST RECONSTRUCTION WITH PLACEMENT OF TISSUE EXPANDER AND FLEX HD (ACELLULAR HYDRATED DERMIS);  Surgeon: Wallace Going, DO;  Location: [place];  Service: Plastics;  Laterality: Bilateral;   COLONOSCOPY WITH PROPOFOL N/A 11/21/2021   Procedure: COLONOSCOPY WITH PROPOFOL;  Surgeon: Lin Landsman, MD;  Location: [facility] ENDOSCOPY;  Service: Gastroenterology;  Laterality: N/A;   IR IMAGING GUIDED PORT INSERTION  10/01/2020   MODIFIED MASTECTOMY  Left 03/17/2021   Procedure: LEFT MODIFIED RADICAL MASTECTOMY;  Surgeon: Erroll Luna, MD;  Location: [place];  Service: General;  Laterality: Left;   PORTA CATH REMOVAL Right 03/17/2021   Procedure: PORTA CATH REMOVAL;  Surgeon: Erroll Luna, MD;  Location: [place];  Service: General;  Laterality: Right;   REMOVAL OF BILATERAL TISSUE EXPANDERS WITH PLACEMENT OF BILATERAL BREAST IMPLANTS Bilateral 05/09/2021   Procedure: REMOVAL OF BILATERAL TISSUE EXPANDERS WITH PLACEMENT OF BILATERAL BREAST IMPLANTS;  Surgeon: Wallace Going, DO;  Location: [place];  Service: Plastics;  Laterality: Bilateral;  90 min   TOTAL MASTECTOMY Right 03/17/2021   Procedure: TOTAL MASTECTOMY;  Surgeon: Erroll Luna, MD;  Location: [place];  Service: General;  Laterality: Right;    Social History: Social History   Socioeconomic History   Marital status: Divorced    Spouse name: Not on file   Number of children: 2   Years of education: Not on file   Highest education level: Not on file  Occupational History   Occupation: Nurse     Employer: [employer]  Tobacco Use   Smoking status: Every Day    Packs/day: 0.50    Types: Cigarettes   Smokeless tobacco: Never   Tobacco comments:    Pt trying to quit now. Has reduced amount significantly  Vaping Use   Vaping Use: Never used  Substance and Sexual Activity   Alcohol use: Not Currently   Drug use: No   Sexual activity: Not Currently    Birth control/protection: Surgical    Comment: hyst  Other Topics Concern   Not on file  Social History Narrative   Patient works as an Warden/ranger at [employer]. She has 2 children at home who have special needs. She and her spouse are primary caregivers.    Social Determinants of Health   Financial Resource Strain: Not on file  Food Insecurity: Not on file  Transportation Needs: Not on file  Physical Activity: Not on file  Stress: Not on file  Social Connections: Not on file  Intimate Partner Violence: Not on file    Family History: Family History  Problem Relation Age of Onset   Hypertension Mother    Osteoarthritis Mother    Diverticulitis Mother    Heart failure Father    Hypertension Father    Gout Father    Heart attack Father 73   Diverticulitis Brother    Ovarian cancer Paternal Grandmother     Review of Systems: Denies fevers, chills or recent changes in health  Physical Exam: Vital Signs BP 126/81 (BP Location: Right Arm, Patient Position: Sitting, Cuff Size: Normal)   Pulse 86   Ht 5' (1.524 m)   Wt 121 lb 12.8 oz (55.2 kg)   SpO2 98%   BMI 23.79 kg/m   Physical Exam  Constitutional:      General: Not in acute distress.    Appearance: Normal appearance. Not ill-appearing.  HENT:     Head: Normocephalic and atraumatic.  Neck:     Musculoskeletal: Normal range of motion.  Cardiovascular:     Rate and Rhythm: Normal rate Pulmonary:     Effort: Pulmonary effort is normal. No respiratory distress.  Musculoskeletal: Normal range of motion.  Skin:    General: Skin is warm and dry.     Findings:  No erythema or rash.  Neurological:      Mental Status: Alert and oriented to person, place, and  time. Mental status is at baseline.  Psychiatric:        Mood and Affect: Mood normal.        Behavior: Behavior normal.    Assessment/Plan: The patient is scheduled for right breast capsule release with adjustment with Dr. Marla Roe.  Risks, benefits, and alternatives of procedure discussed, questions answered and consent obtained.    Smoking Status: Current smoker; Counseling Given?  I encouraged the patient that she should stop smoking now and continue to to not smoke throughout the preoperative and postoperative period.  I discussed with the patient that smoking can increase the risk of complications postoperatively.  Patient expressed understanding. Last Mammogram: 09/08/2020; Results: Highly suggestive of malignancy  Caprini Score: 7; Risk Factors include: Age, recent pneumonia, history of malignancy, history of smoking, and length of planned surgery. Recommendation for mechanical and possible pharmacologic prophylaxis. Encourage early ambulation.  I will discuss the possibility of Lovenox postoperatively with the surgical team.  Pictures obtained: 08/28/22  Surgical clearances to be sent to patient's Cardiologist Dr. Kate Sable, patient's PCP Mable Paris FNP and to patient's pain management provider Dr. Milinda Pointer for postoperative pain management recommendations.   Post-op Rx sent to pharmacy: Patient reports she already has Zofran at home and already has the antibiotic that was sent in at her last preoperative appointment and states that she can take these postoperatively.  I discussed with the patient that I will have to get clearance back from her pain management provider before I prescribe any pain medications for postoperatively.  Patient expressed understanding.  I instructed the patient to hold any multivitamins, vitamins or supplements 1 week prior to surgery.  I  discussed with the patient that she should hold her baclofen, Ativan and oxycodone the day of surgery.  Patient expressed understanding.  Patient was provided with the General Surgical Risk consent document and Pain Medication Agreement prior to their appointment.  They had adequate time to read through the risk consent documents and Pain Medication Agreement. We also discussed them in person together during this preop appointment. All of their questions were answered to their satisfaction.  Recommended calling if they have any further questions.  Risk consent form and Pain Medication Agreement to be scanned into patient's chart.  The consent was obtained with risks and complications reviewed which included bleeding, pain, scar, infection and the risk of anesthesia.  The patients questions were answered to the patients expressed satisfaction.    Electronically signed by: Clance Boll, PA-C 10/09/2022 4:41 PM

## 2022-10-10 ENCOUNTER — Telehealth: Payer: Self-pay | Admitting: Family

## 2022-10-10 ENCOUNTER — Ambulatory Visit (INDEPENDENT_AMBULATORY_CARE_PROVIDER_SITE_OTHER): Payer: No Typology Code available for payment source

## 2022-10-10 ENCOUNTER — Other Ambulatory Visit: Payer: Self-pay | Admitting: Family

## 2022-10-10 ENCOUNTER — Telehealth: Payer: Self-pay | Admitting: Cardiology

## 2022-10-10 ENCOUNTER — Telehealth: Payer: Self-pay

## 2022-10-10 DIAGNOSIS — J189 Pneumonia, unspecified organism: Secondary | ICD-10-CM

## 2022-10-10 DIAGNOSIS — F17200 Nicotine dependence, unspecified, uncomplicated: Secondary | ICD-10-CM

## 2022-10-10 NOTE — Telephone Encounter (Signed)
Fax was placed on providers desk to be signed .

## 2022-10-10 NOTE — Telephone Encounter (Signed)
Left a message with pt's husband to have the pt call back to schedule a tele pre op appt.

## 2022-10-10 NOTE — Telephone Encounter (Signed)
   Pre-operative Risk Assessment    Patient Name: Tricia Berry  DOB: 11/11/1970 MRN: [id number]      Request for Surgical Clearance    Procedure:   Right Breast Capsule release with Adjustment  Date of Surgery:  Clearance 10/18/22                                 Surgeon:  Dr Audelia Hives Surgeon's Group or Practice Name:  [facility]  Phone number:  [phone] Fax number:  [phone]   Type of Clearance Requested:   - Medical    Type of Anesthesia:  Not Indicated   Additional requests/questions:    Manfred Arch   10/10/2022, 2:44 PM

## 2022-10-10 NOTE — Telephone Encounter (Signed)
We received a request for surgical clearance from [facility].  I handed the fax to Jenate Martinique, [place].

## 2022-10-10 NOTE — Telephone Encounter (Signed)
Tricia Berry,  Pt coming at 2;30 today for repeat CXR. FYI.   I Spoke with pt We agreed f/u PNA prudent.  Sob, cough has resolved. She is feeling better and ready to have surgery as scheduled 10/18/22.

## 2022-10-10 NOTE — Telephone Encounter (Signed)
Faxed surgical clearance to: Dr. Milinda Pointer [phone] (need recommendations/clearance for prescribing postoperative pain medications), Dr. Aaron Edelman [place] [phone], and [facility] [facility], [place] scheduled 10/18/2022

## 2022-10-10 NOTE — Telephone Encounter (Signed)
Pt came in for appt today.  

## 2022-10-10 NOTE — Telephone Encounter (Signed)
Faxed surgery clearance to Dr. Kate Sable, Margarete Arnett-FNP, and Milinda Pointer

## 2022-10-10 NOTE — Telephone Encounter (Signed)
   Name: Tricia Berry  DOB: 08-30-1971  MRN: [id number]  Primary Cardiologist: Kate Sable, MD   Preoperative team, please contact this patient and set up a phone call appointment for further preoperative risk assessment. Please obtain consent and complete medication review. Thank you for your help.  I confirm that guidance regarding antiplatelet and oral anticoagulation therapy has been completed and, if necessary, noted below (none requested).    [name], NP 10/10/2022, 3:55 PM [facility]

## 2022-10-11 ENCOUNTER — Encounter: Payer: No Typology Code available for payment source | Admitting: Pain Medicine

## 2022-10-11 ENCOUNTER — Telehealth: Payer: Self-pay

## 2022-10-11 ENCOUNTER — Encounter: Payer: Self-pay | Admitting: Family

## 2022-10-11 ENCOUNTER — Telehealth: Payer: Self-pay | Admitting: *Deleted

## 2022-10-11 ENCOUNTER — Telehealth: Payer: Self-pay | Admitting: Pain Medicine

## 2022-10-11 NOTE — Telephone Encounter (Signed)
Pt agreeable to plan of care for tele pre op appt 10/12/22 @ 10:20. Med rec and consent are done.

## 2022-10-11 NOTE — Telephone Encounter (Signed)
Fax was sent

## 2022-10-11 NOTE — Telephone Encounter (Signed)
PT stated that she will be having surgery on 10-18-22. PT will be having surgery on her right breast. Provider will be Dr Marla Roe. PT wanted to make Dr. Dossie Arbour aware of this surgery.

## 2022-10-11 NOTE — Telephone Encounter (Signed)
Patient returning call.

## 2022-10-11 NOTE — Telephone Encounter (Signed)
Patient will be having surgery.would like for [place] to know

## 2022-10-11 NOTE — Telephone Encounter (Signed)
Insurance Treatment Denial Note  Date order was entered:  Order entered by: Milinda Pointer, MD Requested treatment: Left hip injection Reason for denial: Treatment not covered by plan. Recommended for approval:  not medically necessary   Her insurance denied auth claiming it is not medically necessary and considered experimental.

## 2022-10-11 NOTE — Telephone Encounter (Signed)
Pt agreeable to plan of care for tele pre op appt 10/12/22 @ 10:20. Med rec and consent are done.      Patient Consent for Virtual Visit        Tricia Berry has provided verbal consent on 10/11/2022 for a virtual visit (video or telephone).   CONSENT FOR VIRTUAL VISIT FOR:  Tricia Berry  By participating in this virtual visit I agree to the following:  I hereby voluntarily request, consent and authorize [facility] and its employed or contracted physicians, physician assistants, nurse practitioners or other licensed health care professionals (the Practitioner), to provide me with telemedicine health care services (the "Services") as deemed necessary by the treating Practitioner. I acknowledge and consent to receive the Services by the Practitioner via telemedicine. I understand that the telemedicine visit will involve communicating with the Practitioner through live audiovisual communication technology and the disclosure of certain medical information by electronic transmission. I acknowledge that I have been given the opportunity to request an in-person assessment or other available alternative prior to the telemedicine visit and am voluntarily participating in the telemedicine visit.  I understand that I have the right to withhold or withdraw my consent to the use of telemedicine in the course of my care at any time, without affecting my right to future care or treatment, and that the Practitioner or I may terminate the telemedicine visit at any time. I understand that I have the right to inspect all information obtained and/or recorded in the course of the telemedicine visit and may receive copies of available information for a reasonable fee.  I understand that some of the potential risks of receiving the Services via telemedicine include:  Delay or interruption in medical evaluation due to technological equipment failure or disruption; Information transmitted may not  be sufficient (e.g. poor resolution of images) to allow for appropriate medical decision making by the Practitioner; and/or  In rare instances, security protocols could fail, causing a breach of personal health information.  Furthermore, I acknowledge that it is my responsibility to provide information about my medical history, conditions and care that is complete and accurate to the best of my ability. I acknowledge that Practitioner's advice, recommendations, and/or decision may be based on factors not within their control, such as incomplete or inaccurate data provided by me or distortions of diagnostic images or specimens that may result from electronic transmissions. I understand that the practice of medicine is not an exact science and that Practitioner makes no warranties or guarantees regarding treatment outcomes. I acknowledge that a copy of this consent can be made available to me via my patient portal ([facility]), or I can request a printed copy by calling the office of [facility].    I understand that my insurance will be billed for this visit.   I have read or had this consent read to me. I understand the contents of this consent, which adequately explains the benefits and risks of the Services being provided via telemedicine.  I have been provided ample opportunity to ask questions regarding this consent and the Services and have had my questions answered to my satisfaction. I give my informed consent for the services to be provided through the use of telemedicine in my medical care

## 2022-10-12 ENCOUNTER — Encounter: Payer: Self-pay | Admitting: Student

## 2022-10-12 ENCOUNTER — Ambulatory Visit: Payer: No Typology Code available for payment source | Attending: Internal Medicine | Admitting: Cardiology

## 2022-10-12 ENCOUNTER — Telehealth: Payer: Self-pay | Admitting: *Deleted

## 2022-10-12 DIAGNOSIS — Z01810 Encounter for preprocedural cardiovascular examination: Secondary | ICD-10-CM

## 2022-10-12 NOTE — Progress Notes (Signed)
Surgical Clearance has been received from patient's PCP, Mable Paris FNP, for patient's upcoming surgery with Dr. Marla Roe.  Per Mable Paris, FNP, patient is medically cleared for surgery as long as she is cleared by cardiology, pain management and Dr. Marla Roe is aware of the chest x-ray that patient recently had done.  Per patient's PCP, patient is moderate risk due to smoking history, atherosclerosis, recent pneumonia.  She stated in the clearance that she will need pain management and cardiology clearance.  Per patient's chart review, patient had a visit with cardiology today.  Per cardiology note, patient may proceed with surgery at an acceptable risk.  Pain management clearance has been sent and is still pending to be received by our clinic.

## 2022-10-12 NOTE — Telephone Encounter (Signed)
Spoke with patient, notified her of new sx time of 730 with 6a

## 2022-10-12 NOTE — Progress Notes (Signed)
Virtual Visit via Telephone Note   Because of Prudence G Flye's co-morbid illnesses, she is at least at moderate risk for complications without adequate follow up.  This format is felt to be most appropriate for this patient at this time.  The patient did not have access to video technology/had technical difficulties with video requiring transitioning to audio format only (telephone).  All issues noted in this document were discussed and addressed.  No physical exam could be performed with this format.  Please refer to the patient's chart for her consent to telehealth for [facility].  Evaluation Performed:  Preoperative cardiovascular risk assessment for right breast capsule release with adjustment with Dr Audelia Hives.  _____________   Date:  10/12/2022   Patient ID:  Tricia Berry, DOB Oct 05, 1971, MRN [id number] Patient Location:  Home Provider location:   Office  Primary Care Provider:  Burnard Hawthorne, FNP Primary Cardiologist:  Kate Sable, MD  Chief Complaint / Patient Profile   51 y.o. y/o female with a h/o hyperlipidemia, current smoker x20+ years, breast cancer on chemo, who is pending right breast capsule release with adjustment and presents today for telephonic preoperative cardiovascular risk assessment.  History of Present Illness    Tricia Berry is a 51 y.o. female who presents via audio/video conferencing for a telehealth visit today.  Pt was last seen in cardiology clinic on 12/15/21 by Dr. Garen Lah .  At that time Tricia Berry was doing ok, with some DOE, subsequent CTA showed calcium score of 0, echo LV 60-65%, no RWMA.  The patient is now pending procedure as outlined above. Since her last visit, she continues to do well from a cardiac standpoint.   Today, she denies chest pain, palpitations, dyspnea, pnd, orthopnea, n, v, dizziness, syncope, edema, weight gain, melena, hematochezia, or early satiety.   Past Medical  History    Past Medical History:  Diagnosis Date   Anxiety    Breast cancer ([place]) 12/2021   left breast IMC   Diverticulitis    Family history of ovarian cancer    GERD (gastroesophageal reflux disease)    IBS (irritable bowel syndrome)    Pneumonia    PONV (postoperative nausea and vomiting)    severe migraine and vomiting post anesthesia   Scoliosis    Past Surgical History:  Procedure Laterality Date   ABDOMINAL HYSTERECTOMY     still has ovaries, no gyn cancer, hysterectomy due to endometriosis. NO cervix on exam 01/10/21   BREAST BIOPSY Left 09/15/2020   [place] bx of mass, path pending, Q marker   BREAST BIOPSY Left 09/15/2020   [place] bx of LN, hydromarker, path pending   BREAST RECONSTRUCTION WITH PLACEMENT OF TISSUE EXPANDER AND FLEX HD (ACELLULAR HYDRATED DERMIS) Bilateral 03/17/2021   Procedure: IMMEDIATE BILATERAL BREAST RECONSTRUCTION WITH PLACEMENT OF TISSUE EXPANDER AND FLEX HD (ACELLULAR HYDRATED DERMIS);  Surgeon: Wallace Going, DO;  Location: [place];  Service: Plastics;  Laterality: Bilateral;   COLONOSCOPY WITH PROPOFOL N/A 11/21/2021   Procedure: COLONOSCOPY WITH PROPOFOL;  Surgeon: Lin Landsman, MD;  Location: [facility] ENDOSCOPY;  Service: Gastroenterology;  Laterality: N/A;   IR IMAGING GUIDED PORT INSERTION  10/01/2020   MODIFIED MASTECTOMY Left 03/17/2021   Procedure: LEFT MODIFIED RADICAL MASTECTOMY;  Surgeon: Erroll Luna, MD;  Location: [place];  Service: General;  Laterality: Left;   PORTA CATH REMOVAL Right 03/17/2021   Procedure: PORTA CATH REMOVAL;  Surgeon: Erroll Luna, MD;  Location:  [place];  Service: General;  Laterality: Right;   REMOVAL OF BILATERAL TISSUE EXPANDERS WITH PLACEMENT OF BILATERAL BREAST IMPLANTS Bilateral 05/09/2021   Procedure: REMOVAL OF BILATERAL TISSUE EXPANDERS WITH PLACEMENT OF BILATERAL BREAST IMPLANTS;  Surgeon: Wallace Going, DO;  Location: [place];  Service: Plastics;  Laterality: Bilateral;  90 min   TOTAL MASTECTOMY Right 03/17/2021   Procedure: TOTAL MASTECTOMY;  Surgeon: Erroll Luna, MD;  Location: [place];  Service: General;  Laterality: Right;    Allergies  Allergies  Allergen Reactions   Morphine Nausea And Vomiting and Other (See Comments)    migranes Other reaction(s): Headache Migraine, vomiting   Sertraline Hcl     REACTION: Worsened symptoms of IBS   Sulfa Antibiotics Rash   Sulfamethoxazole Rash   Sulfonamide Derivatives Rash    Home Medications    Prior to Admission medications   Medication Sig Start Date End Date Taking? Authorizing Provider  albuterol (VENTOLIN HFA) 108 (90 Base) MCG/ACT inhaler Inhale 2 puffs into the lungs every 6 (six) hours as needed for wheezing or shortness of breath. 09/08/22   Burnard Hawthorne, FNP  anastrozole (ARIMIDEX) 1 MG tablet Take 1 tablet (1 mg total) by mouth daily. 08/30/22   Sindy Guadeloupe, MD  baclofen (LIORESAL) 10 MG tablet Take 1 tablet (10 mg total) by mouth 3 (three) times daily as needed for muscle spasms. 08/31/22   Ventura Sellers, MD  cyanocobalamin (VITAMIN B12) 1000 MCG/ML injection 1000 mcg (1 mL) intramuscular injection in the thigh ( vastus lateralis) once per month. 11/16/21   Burnard Hawthorne, FNP  FLUoxetine (PROZAC) 20 MG capsule Take 1 capsule (20 mg total) by mouth every morning. 05/02/22   Burnard Hawthorne, FNP  Lactobacillus (PROBIOTIC ACIDOPHILUS PO) Take 1 capsule by mouth daily.    [provider]  LORazepam (ATIVAN) 0.5 MG tablet Take 1 tablet (0.5 mg total) by mouth at bedtime. 08/01/22   Burnard Hawthorne, FNP  Multiple Vitamins-Minerals (MULTIVITAL PO) Take 1 Dose by mouth daily.    [provider]  naloxone [facility]) nasal spray 4 mg/0.1 mL Place 1 spray into the nose as needed for opioid-induced respiratory depresssion. In case of emergency (overdose), spray once into each nostril. If no  response within 3 minutes, repeat application and call [phone]. 08/14/22 08/14/23  Milinda Pointer, MD  nicotine (NICODERM CQ - DOSED IN MG/24 HOURS) 21 mg/24hr patch use 1 patch daily 08/22/22 08/22/23  Letta Median, RPH  oxyCODONE (OXY IR/ROXICODONE) 5 MG immediate release tablet Take 1 tablet (5 mg total) by mouth 2 (two) times daily. Must last 30 days. 10/18/22 11/17/22  Milinda Pointer, MD  pregabalin (LYRICA) 150 MG capsule Take 1 capsule (150 mg total) by mouth in the morning, at noon, and at bedtime. 10/04/22   Burnard Hawthorne, FNP  trimethoprim (TRIMPEX) 100 MG tablet Take 1 tablet (100 mg total) by mouth daily. 04/26/22   Hollice Espy, MD    Physical Exam    Vital Signs:  Tricia Berry does not have vital signs available for review today.  Given telephonic nature of communication, physical exam is limited. AAOx3. NAD. Normal affect.  Speech and respirations are unlabored.  Accessory Clinical Findings    None  Assessment & Plan    1.  Preoperative Cardiovascular Risk Assessment: risk assessment for right breast capsule release     Tricia Berry's perioperative risk of a major cardiac event is 0.4%  according to the Revised Cardiac Risk Index (RCRI).  Therefore, she is at low risk for perioperative complications.    Recommendations: According to ACC/AHA guidelines, no further cardiovascular testing needed.  The patient may proceed to surgery at acceptable risk.      The patient was advised that if she develops new symptoms prior to surgery to contact our office to arrange for a follow-up visit, and she verbalized understanding.   Time:   Today, I have spent 7 minutes with the patient with telehealth technology discussing medical history, symptoms, and management plan. Prior to the phone visit spent over 10 minutes discussing reviewing her past medical history and cardiac medications.    Trudi Ida, NP  10/12/2022, 10:29 AM

## 2022-10-12 NOTE — H&P (View-Only) (Signed)
Surgical Clearance has been received from patient's PCP, Mable Paris FNP, for patient's upcoming surgery with Dr. Marla Roe.  Per Mable Paris, FNP, patient is medically cleared for surgery as long as she is cleared by cardiology, pain management and Dr. Marla Roe is aware of the chest x-ray that patient recently had done.  Per patient's PCP, patient is moderate risk due to smoking history, atherosclerosis, recent pneumonia.  She stated in the clearance that she will need pain management and cardiology clearance.  Per patient's chart review, patient had a visit with cardiology today.  Per cardiology note, patient may proceed with surgery at an acceptable risk.  Pain management clearance has been sent and is still pending to be received by our clinic.

## 2022-10-13 ENCOUNTER — Telehealth: Payer: Self-pay | Admitting: Pain Medicine

## 2022-10-13 NOTE — Telephone Encounter (Signed)
Surgeon letter faxed to number provided.

## 2022-10-13 NOTE — Telephone Encounter (Signed)
Olivia Mackie from plastic surgeon office left vmail asking for clearance of post op pain meds for this patient. Please fax to [phone] No phone numbers left

## 2022-10-17 ENCOUNTER — Other Ambulatory Visit: Payer: Self-pay | Admitting: Family

## 2022-10-17 ENCOUNTER — Encounter (HOSPITAL_BASED_OUTPATIENT_CLINIC_OR_DEPARTMENT_OTHER): Payer: Self-pay | Admitting: Plastic Surgery

## 2022-10-17 ENCOUNTER — Telehealth: Payer: Self-pay

## 2022-10-17 ENCOUNTER — Other Ambulatory Visit: Payer: Self-pay

## 2022-10-17 ENCOUNTER — Other Ambulatory Visit: Payer: Self-pay | Admitting: Internal Medicine

## 2022-10-17 DIAGNOSIS — F411 Generalized anxiety disorder: Secondary | ICD-10-CM

## 2022-10-17 MED ORDER — BACLOFEN 10 MG PO TABS
10.0000 mg | ORAL_TABLET | Freq: Three times a day (TID) | ORAL | 1 refills | Status: DC | PRN
  Filled 2022-10-17: qty 15, 5d supply, fill #0
  Filled 2022-10-29: qty 15, 5d supply, fill #1

## 2022-10-17 MED FILL — Lorazepam Tab 0.5 MG: ORAL | 30 days supply | Qty: 30 | Fill #0 | Status: CN

## 2022-10-17 NOTE — Telephone Encounter (Signed)
2nd follow up call Dr. Adalberto Cole office requesting surgery clearance to be faxed back ASAP. Surgery is scheduled tomorrow 12/27.

## 2022-10-17 NOTE — Anesthesia Preprocedure Evaluation (Addendum)
Anesthesia Evaluation  Patient identified by MRN, date of birth, ID band Patient awake    Reviewed: Allergy & Precautions, NPO status , Patient's Chart, lab work & pertinent test results  History of Anesthesia Complications (+) PONV and history of anesthetic complications  Airway Mallampati: II  TM Distance: >3 FB Neck ROM: Full    Dental  (+) Edentulous Upper, Edentulous Lower, Upper Dentures, Lower Dentures   Pulmonary pneumonia, resolved, Current Smoker and Patient abstained from smoking.   Pulmonary exam normal breath sounds clear to auscultation       Cardiovascular negative cardio ROS Normal cardiovascular exam Rhythm:Regular Rate:Normal     Neuro/Psych  PSYCHIATRIC DISORDERS Anxiety Depression    Chronic pain syndrome Chemotherapy induced peripheral neuropathy  Neuromuscular disease    GI/Hepatic ,GERD  Medicated,,Hx/o hepatic steatosis IBS   Endo/Other  Right breast assymetry after breast reconstruction Hx/o left breast Ca S/P bilateral mastectomies with reconstruction  Renal/GU negative Renal ROS  negative genitourinary   Musculoskeletal  (+) Arthritis , Osteoarthritis,  Scoliosis Chronic low back pain   Abdominal   Peds  Hematology negative hematology ROS (+)   Anesthesia Other Findings   Reproductive/Obstetrics                             Anesthesia Physical Anesthesia Plan  ASA: 2  Anesthesia Plan: General   Post-op Pain Management: Dilaudid IV, Precedex, Ketamine IV*, Tylenol PO (pre-op)* and Minimal or no pain anticipated   Induction: Intravenous  PONV Risk Score and Plan: 4 or greater and TIVA, Midazolam, Treatment may vary due to age or medical condition, Scopolamine patch - Pre-op, Dexamethasone, Ondansetron, Aprepitant and Diphenhydramine  Airway Management Planned: LMA  Additional Equipment: None  Intra-op Plan:   Post-operative Plan: Extubation in  OR  Informed Consent: I have reviewed the patients History and Physical, chart, labs and discussed the procedure including the risks, benefits and alternatives for the proposed anesthesia with the patient or authorized representative who has indicated his/her understanding and acceptance.     Dental advisory given  Plan Discussed with: Anesthesiologist and CRNA  Anesthesia Plan Comments:         Anesthesia Quick Evaluation

## 2022-10-18 ENCOUNTER — Ambulatory Visit (HOSPITAL_BASED_OUTPATIENT_CLINIC_OR_DEPARTMENT_OTHER)
Admission: RE | Admit: 2022-10-18 | Discharge: 2022-10-18 | Disposition: A | Discharge status: Home / Self Care | Payer: No Typology Code available for payment source | Attending: Plastic Surgery | Admitting: Plastic Surgery

## 2022-10-18 ENCOUNTER — Ambulatory Visit (HOSPITAL_BASED_OUTPATIENT_CLINIC_OR_DEPARTMENT_OTHER): Payer: No Typology Code available for payment source | Admitting: Anesthesiology

## 2022-10-18 ENCOUNTER — Telehealth: Payer: Self-pay | Admitting: Pain Medicine

## 2022-10-18 ENCOUNTER — Encounter (HOSPITAL_BASED_OUTPATIENT_CLINIC_OR_DEPARTMENT_OTHER): Payer: Self-pay | Admitting: Plastic Surgery

## 2022-10-18 ENCOUNTER — Encounter: Payer: Self-pay | Admitting: Oncology

## 2022-10-18 ENCOUNTER — Telehealth: Payer: Self-pay

## 2022-10-18 ENCOUNTER — Other Ambulatory Visit: Payer: Self-pay

## 2022-10-18 ENCOUNTER — Encounter (HOSPITAL_BASED_OUTPATIENT_CLINIC_OR_DEPARTMENT_OTHER)
Admission: RE | Disposition: A | Discharge status: Home / Self Care | Payer: Self-pay | Source: Home / Self Care | Attending: Plastic Surgery

## 2022-10-18 DIAGNOSIS — F418 Other specified anxiety disorders: Secondary | ICD-10-CM | POA: Diagnosis not present

## 2022-10-18 DIAGNOSIS — Z9013 Acquired absence of bilateral breasts and nipples: Secondary | ICD-10-CM

## 2022-10-18 DIAGNOSIS — Z853 Personal history of malignant neoplasm of breast: Secondary | ICD-10-CM | POA: Diagnosis not present

## 2022-10-18 DIAGNOSIS — Z45811 Encounter for adjustment or removal of right breast implant: Secondary | ICD-10-CM | POA: Diagnosis not present

## 2022-10-18 DIAGNOSIS — K219 Gastro-esophageal reflux disease without esophagitis: Secondary | ICD-10-CM | POA: Insufficient documentation

## 2022-10-18 DIAGNOSIS — K589 Irritable bowel syndrome without diarrhea: Secondary | ICD-10-CM | POA: Insufficient documentation

## 2022-10-18 DIAGNOSIS — Z9882 Breast implant status: Secondary | ICD-10-CM | POA: Diagnosis not present

## 2022-10-18 DIAGNOSIS — F17210 Nicotine dependence, cigarettes, uncomplicated: Secondary | ICD-10-CM | POA: Diagnosis not present

## 2022-10-18 DIAGNOSIS — N651 Disproportion of reconstructed breast: Secondary | ICD-10-CM | POA: Insufficient documentation

## 2022-10-18 DIAGNOSIS — Z45812 Encounter for adjustment or removal of left breast implant: Secondary | ICD-10-CM

## 2022-10-18 DIAGNOSIS — Z01818 Encounter for other preprocedural examination: Secondary | ICD-10-CM

## 2022-10-18 HISTORY — DX: Pneumonia, unspecified organism: J18.9

## 2022-10-18 HISTORY — PX: CAPSULECTOMY: SHX5381

## 2022-10-18 SURGERY — CAPSULECTOMY, BREAST
Anesthesia: General | Site: Breast | Laterality: Right

## 2022-10-18 MED ORDER — LIDOCAINE 2% (20 MG/ML) 5 ML SYRINGE
INTRAMUSCULAR | Status: AC
  Filled 2022-10-18: qty 5

## 2022-10-18 MED ORDER — BUPIVACAINE HCL (PF) 0.25 % IJ SOLN
INTRAMUSCULAR | Status: AC
  Filled 2022-10-18: qty 30

## 2022-10-18 MED ORDER — ONDANSETRON HCL 4 MG/2ML IJ SOLN
INTRAMUSCULAR | Status: AC
  Filled 2022-10-18: qty 2

## 2022-10-18 MED ORDER — EPINEPHRINE PF 1 MG/ML IJ SOLN
INTRAMUSCULAR | Status: AC
  Filled 2022-10-18: qty 1

## 2022-10-18 MED ORDER — DEXAMETHASONE SODIUM PHOSPHATE 10 MG/ML IJ SOLN
INTRAMUSCULAR | Status: AC
  Filled 2022-10-18: qty 1

## 2022-10-18 MED ORDER — OXYCODONE HCL 5 MG PO TABS
5.0000 mg | ORAL_TABLET | Freq: Three times a day (TID) | ORAL | 0 refills | Status: DC | PRN
  Filled 2022-10-18 (×2): qty 15, 5d supply, fill #0

## 2022-10-18 MED ORDER — OXYCODONE HCL 5 MG PO TABS
5.0000 mg | ORAL_TABLET | Freq: Once | ORAL | Status: AC | PRN
  Administered 2022-10-18: 5 mg via ORAL

## 2022-10-18 MED ORDER — ACETAMINOPHEN 500 MG PO TABS
1000.0000 mg | ORAL_TABLET | Freq: Once | ORAL | Status: AC
  Administered 2022-10-18: 1000 mg via ORAL

## 2022-10-18 MED ORDER — LIDOCAINE HCL (PF) 1 % IJ SOLN
INTRAMUSCULAR | Status: AC
  Filled 2022-10-18: qty 30

## 2022-10-18 MED ORDER — LIDOCAINE-EPINEPHRINE 1 %-1:100000 IJ SOLN
INTRAMUSCULAR | Status: DC | PRN
  Administered 2022-10-18: 10 mL

## 2022-10-18 MED ORDER — HYDROMORPHONE HCL 1 MG/ML IJ SOLN
INTRAMUSCULAR | Status: AC
  Filled 2022-10-18: qty 0.5

## 2022-10-18 MED ORDER — SCOPOLAMINE 1 MG/3DAYS TD PT72
1.0000 | MEDICATED_PATCH | TRANSDERMAL | Status: DC
  Administered 2022-10-18: 1.5 mg via TRANSDERMAL

## 2022-10-18 MED ORDER — DIPHENHYDRAMINE HCL 50 MG/ML IJ SOLN: 12.5000 mg | Freq: Once | INTRAMUSCULAR | Status: DC

## 2022-10-18 MED ORDER — DIPHENHYDRAMINE HCL 50 MG/ML IJ SOLN
INTRAMUSCULAR | Status: AC
  Filled 2022-10-18: qty 1

## 2022-10-18 MED ORDER — APREPITANT 40 MG PO CAPS
ORAL_CAPSULE | ORAL | Status: AC
  Filled 2022-10-18: qty 1

## 2022-10-18 MED ORDER — CEFAZOLIN SODIUM-DEXTROSE 2-4 GM/100ML-% IV SOLN
INTRAVENOUS | Status: AC
  Filled 2022-10-18: qty 100

## 2022-10-18 MED ORDER — SCOPOLAMINE 1 MG/3DAYS TD PT72
MEDICATED_PATCH | TRANSDERMAL | Status: AC
  Filled 2022-10-18: qty 1

## 2022-10-18 MED ORDER — DEXAMETHASONE SODIUM PHOSPHATE 10 MG/ML IJ SOLN
INTRAMUSCULAR | Status: DC | PRN
  Administered 2022-10-18: 10 mg via INTRAVENOUS

## 2022-10-18 MED ORDER — ONDANSETRON HCL 4 MG/2ML IJ SOLN: 4.0000 mg | Freq: Once | INTRAMUSCULAR | Status: DC | PRN

## 2022-10-18 MED ORDER — MIDAZOLAM HCL 2 MG/2ML IJ SOLN
INTRAMUSCULAR | Status: AC
  Filled 2022-10-18: qty 2

## 2022-10-18 MED ORDER — FENTANYL CITRATE (PF) 100 MCG/2ML IJ SOLN
INTRAMUSCULAR | Status: DC | PRN
  Administered 2022-10-18: 25 ug via INTRAVENOUS

## 2022-10-18 MED ORDER — LACTATED RINGERS IV SOLN: INTRAVENOUS | Status: DC

## 2022-10-18 MED ORDER — KETAMINE HCL 50 MG/5ML IJ SOSY
PREFILLED_SYRINGE | INTRAMUSCULAR | Status: AC
  Filled 2022-10-18: qty 5

## 2022-10-18 MED ORDER — SODIUM CHLORIDE 0.9 % IV SOLN
INTRAVENOUS | Status: AC
  Filled 2022-10-18: qty 10

## 2022-10-18 MED ORDER — MIDAZOLAM HCL 5 MG/5ML IJ SOLN
INTRAMUSCULAR | Status: DC | PRN
  Administered 2022-10-18: 2 mg via INTRAVENOUS

## 2022-10-18 MED ORDER — 0.9 % SODIUM CHLORIDE (POUR BTL) OPTIME
TOPICAL | Status: DC | PRN
  Administered 2022-10-18: 1000 mL

## 2022-10-18 MED ORDER — OXYCODONE HCL 5 MG PO TABS
ORAL_TABLET | ORAL | Status: AC
  Filled 2022-10-18: qty 1

## 2022-10-18 MED ORDER — DROPERIDOL 2.5 MG/ML IJ SOLN: 0.6250 mg | Freq: Once | INTRAMUSCULAR | Status: DC | PRN

## 2022-10-18 MED ORDER — SODIUM CHLORIDE 0.9 % IV SOLN
INTRAVENOUS | Status: DC | PRN
  Administered 2022-10-18: 500 mL

## 2022-10-18 MED ORDER — HYDROMORPHONE HCL 1 MG/ML IJ SOLN
0.2500 mg | INTRAMUSCULAR | Status: DC | PRN
  Administered 2022-10-18 (×3): 0.5 mg via INTRAVENOUS

## 2022-10-18 MED ORDER — APREPITANT 40 MG PO CAPS
40.0000 mg | ORAL_CAPSULE | Freq: Once | ORAL | Status: AC
  Administered 2022-10-18: 40 mg via ORAL

## 2022-10-18 MED ORDER — CEFAZOLIN SODIUM-DEXTROSE 2-4 GM/100ML-% IV SOLN
2.0000 g | INTRAVENOUS | Status: AC
  Administered 2022-10-18: 2 g via INTRAVENOUS

## 2022-10-18 MED ORDER — CHLORHEXIDINE GLUCONATE CLOTH 2 % EX PADS: 6.0000 | MEDICATED_PAD | Freq: Once | CUTANEOUS | Status: DC

## 2022-10-18 MED ORDER — PROPOFOL 10 MG/ML IV BOLUS
INTRAVENOUS | Status: AC
  Filled 2022-10-18: qty 20

## 2022-10-18 MED ORDER — FENTANYL CITRATE (PF) 100 MCG/2ML IJ SOLN
INTRAMUSCULAR | Status: AC
  Filled 2022-10-18: qty 2

## 2022-10-18 MED ORDER — PROPOFOL 500 MG/50ML IV EMUL
INTRAVENOUS | Status: DC | PRN
  Administered 2022-10-18: 175 ug/kg/min via INTRAVENOUS

## 2022-10-18 MED ORDER — LIDOCAINE-EPINEPHRINE 1 %-1:100000 IJ SOLN
INTRAMUSCULAR | Status: AC
  Filled 2022-10-18: qty 1

## 2022-10-18 MED ORDER — KETAMINE HCL 10 MG/ML IJ SOLN
INTRAMUSCULAR | Status: DC | PRN
  Administered 2022-10-18: 25 mg via INTRAVENOUS

## 2022-10-18 MED ORDER — ACETAMINOPHEN 500 MG PO TABS
ORAL_TABLET | ORAL | Status: AC
  Filled 2022-10-18: qty 2

## 2022-10-18 MED ORDER — LIDOCAINE HCL (CARDIAC) PF 100 MG/5ML IV SOSY
PREFILLED_SYRINGE | INTRAVENOUS | Status: DC | PRN
  Administered 2022-10-18: 60 mg via INTRATRACHEAL

## 2022-10-18 MED ORDER — DIPHENHYDRAMINE HCL 50 MG/ML IJ SOLN
INTRAMUSCULAR | Status: DC | PRN
  Administered 2022-10-18: 12.5 mg via INTRAVENOUS

## 2022-10-18 MED ORDER — OXYCODONE HCL 5 MG/5ML PO SOLN: 5.0000 mg | Freq: Once | ORAL | Status: AC | PRN

## 2022-10-18 MED ORDER — PROPOFOL 10 MG/ML IV BOLUS
INTRAVENOUS | Status: DC | PRN
  Administered 2022-10-18: 120 mg via INTRAVENOUS

## 2022-10-18 SURGICAL SUPPLY — 58 items
ADH SKN CLS APL DERMABOND .7 (GAUZE/BANDAGES/DRESSINGS)
BAG DECANTER FOR FLEXI CONT (MISCELLANEOUS) ×1 IMPLANT
BINDER BREAST LRG (GAUZE/BANDAGES/DRESSINGS) IMPLANT
BINDER BREAST MEDIUM (GAUZE/BANDAGES/DRESSINGS) IMPLANT
BINDER BREAST XLRG (GAUZE/BANDAGES/DRESSINGS) IMPLANT
BINDER BREAST XXLRG (GAUZE/BANDAGES/DRESSINGS) IMPLANT
BLADE HEX COATED 2.75 (ELECTRODE) ×1 IMPLANT
BLADE SURG 15 STRL LF DISP TIS (BLADE) ×2 IMPLANT
BLADE SURG 15 STRL SS (BLADE) ×2
BNDG GAUZE DERMACEA FLUFF 4 (GAUZE/BANDAGES/DRESSINGS) ×2 IMPLANT
BNDG GZE DERMACEA 4 6PLY (GAUZE/BANDAGES/DRESSINGS)
CANISTER SUCT 1200ML W/VALVE (MISCELLANEOUS) ×1 IMPLANT
COVER BACK TABLE 60X90IN (DRAPES) ×1 IMPLANT
COVER MAYO STAND STRL (DRAPES) ×1 IMPLANT
DERMABOND ADVANCED .7 DNX12 (GAUZE/BANDAGES/DRESSINGS) IMPLANT
DRAIN CHANNEL 19F RND (DRAIN) IMPLANT
DRAPE LAPAROSCOPIC ABDOMINAL (DRAPES) ×1 IMPLANT
ELECT BLADE 4.0 EZ CLEAN MEGAD (MISCELLANEOUS) ×1
ELECT BLADE 6.5 EXT (BLADE) IMPLANT
ELECT REM PT RETURN 9FT ADLT (ELECTROSURGICAL) ×1
ELECTRODE BLDE 4.0 EZ CLN MEGD (MISCELLANEOUS) IMPLANT
ELECTRODE REM PT RTRN 9FT ADLT (ELECTROSURGICAL) ×1 IMPLANT
EVACUATOR SILICONE 100CC (DRAIN) IMPLANT
GAUZE PAD ABD 8X10 STRL (GAUZE/BANDAGES/DRESSINGS) ×2 IMPLANT
GLOVE BIO SURGEON STRL SZ 6.5 (GLOVE) ×2 IMPLANT
GOWN STRL REUS W/ TWL LRG LVL3 (GOWN DISPOSABLE) ×2 IMPLANT
GOWN STRL REUS W/TWL LRG LVL3 (GOWN DISPOSABLE) ×3
KIT FILL ASEPTIC TRANSFER (MISCELLANEOUS) IMPLANT
NDL HYPO 25X1 1.5 SAFETY (NEEDLE) ×1 IMPLANT
NEEDLE HYPO 25X1 1.5 SAFETY (NEEDLE) ×1 IMPLANT
NS IRRIG 1000ML POUR BTL (IV SOLUTION) ×1 IMPLANT
PACK BASIN DAY SURGERY FS (CUSTOM PROCEDURE TRAY) ×1 IMPLANT
PENCIL SMOKE EVACUATOR (MISCELLANEOUS) ×1 IMPLANT
PIN SAFETY STERILE (MISCELLANEOUS) IMPLANT
SLEEVE SCD COMPRESS KNEE MED (STOCKING) ×1 IMPLANT
SPIKE FLUID TRANSFER (MISCELLANEOUS) IMPLANT
SPONGE T-LAP 18X18 ~~LOC~~+RFID (SPONGE) ×2 IMPLANT
STAPLER VISISTAT 35W (STAPLE) IMPLANT
STRIP CLOSURE SKIN 1/2X4 (GAUZE/BANDAGES/DRESSINGS) IMPLANT
STRIP SUTURE WOUND CLOSURE 1/2 (MISCELLANEOUS) IMPLANT
SUT MNCRL AB 4-0 PS2 18 (SUTURE) ×1 IMPLANT
SUT MON AB 3-0 SH 27 (SUTURE) ×1
SUT MON AB 3-0 SH27 (SUTURE) ×1 IMPLANT
SUT MON AB 5-0 PS2 18 (SUTURE) ×2 IMPLANT
SUT PDS 3-0 CT2 (SUTURE) ×1
SUT PDS AB 2-0 CT2 27 (SUTURE) IMPLANT
SUT PDS II 3-0 CT2 27 ABS (SUTURE) IMPLANT
SUT VIC AB 3-0 SH 27 (SUTURE)
SUT VIC AB 3-0 SH 27X BRD (SUTURE) IMPLANT
SUT VICRYL 4-0 PS2 18IN ABS (SUTURE) IMPLANT
SYR 50ML LL SCALE MARK (SYRINGE) IMPLANT
SYR BULB IRRIG 60ML STRL (SYRINGE) ×1 IMPLANT
SYR CONTROL 10ML LL (SYRINGE) ×1 IMPLANT
TOWEL GREEN STERILE FF (TOWEL DISPOSABLE) ×2 IMPLANT
TRAY DSU PREP LF (CUSTOM PROCEDURE TRAY) ×1 IMPLANT
TUBE CONNECTING 20X1/4 (TUBING) ×1 IMPLANT
UNDERPAD 30X36 HEAVY ABSORB (UNDERPADS AND DIAPERS) ×2 IMPLANT
YANKAUER SUCT BULB TIP NO VENT (SUCTIONS) ×1 IMPLANT

## 2022-10-18 NOTE — Anesthesia Postprocedure Evaluation (Signed)
Anesthesia Post Note  Patient: Tricia Berry  Procedure(s) Performed: Right breast capsule release with adjustment (Right: Breast)     Patient location during evaluation: PACU Anesthesia Type: General Level of consciousness: awake and alert and oriented Pain management: pain level controlled Vital Signs Assessment: post-procedure vital signs reviewed and stable Respiratory status: spontaneous breathing, nonlabored ventilation and respiratory function stable Cardiovascular status: blood pressure returned to baseline and stable Postop Assessment: no apparent nausea or vomiting Anesthetic complications: no   No notable events documented.  Last Vitals:  Vitals:   10/18/22 0845 10/18/22 0900  BP: 107/72 121/81  Pulse: 62 68  Resp: 15 14  Temp:    SpO2: 100% 100%    Last Pain:  Vitals:   10/18/22 0910  TempSrc:   PainSc: 5                  Sarena Jezek A.

## 2022-10-18 NOTE — Telephone Encounter (Signed)
Surgeons letter faxed to office./

## 2022-10-18 NOTE — Anesthesia Procedure Notes (Signed)
Procedure Name: LMA Insertion Date/Time: 10/18/2022 7:37 AM  Performed by: Glory Buff, CRNAPre-anesthesia Checklist: Patient identified, Emergency Drugs available, Suction available and Patient being monitored Patient Re-evaluated:Patient Re-evaluated prior to induction Oxygen Delivery Method: Circle system utilized Preoxygenation: Pre-oxygenation with 100% oxygen Induction Type: IV induction LMA: LMA inserted LMA Size: 4.0 Number of attempts: 1 Placement Confirmation: positive ETCO2 Tube secured with: Tape Dental Injury: Teeth and Oropharynx as per pre-operative assessment

## 2022-10-18 NOTE — Interval H&P Note (Signed)
History and Physical Interval Note:  10/18/2022 7:06 AM  Tricia Berry  has presented today for surgery, with the diagnosis of Acquired absence of both breasts, History of left breast cancer, Breast asymmetry following reconstructive surgery.  The various methods of treatment have been discussed with the patient and family. After consideration of risks, benefits and other options for treatment, the patient has consented to  Procedure(s): Right breast capsule release with adjustment (Right) as a surgical intervention.  The patient's history has been reviewed, patient examined, no change in status, stable for surgery.  I have reviewed the patient's chart and labs.  Questions were answered to the patient's satisfaction.     [name] Preston Garabedian

## 2022-10-18 NOTE — Discharge Instructions (Addendum)
INSTRUCTIONS FOR AFTER BREAST SURGERY   You will likely have some questions about what to expect following your operation.  The following information will help you and your family understand what to expect when you are discharged from the hospital.  It is important to follow these guidelines to help ensure a smooth recovery and reduce complication.  Postoperative instructions include information on: diet, wound care, medications and physical activity.  AFTER SURGERY Expect to go home after the procedure.  In some cases, you may need to spend one night in the hospital for observation.  DIET Breast surgery does not require a specific diet.  However, the healthier you eat the better your body will heal. It is important to increasing your protein intake.  This means limiting the foods with sugar and carbohydrates.  Focus on vegetables and some meat.  If you have liposuction during your procedure be sure to drink water.  If your urine is bright yellow, then it is concentrated, and you need to drink more water.  As a general rule after surgery, you should have 8 ounces of water every hour while awake.  If you find you are persistently nauseated or unable to take in liquids let us know.  NO TOBACCO USE or EXPOSURE.  This will slow your healing process and lead to a wound.  WOUND CARE Leave the binder on for 3 days . Use fragrance free soap like Dial, Dove or [place].   After 3 days you can remove the binder to shower. Once dry apply binder or sports bra. If you have liposuction you will have a soft and spongy dressing (Lipofoam) that helps prevent creases in your skin.  Remove before you shower and then replace it.  It is also available on [employer]. If you have steri-strips / tape directly attached to your skin leave them in place. It is OK to get these wet.   No baths, pools or hot tubs for four weeks. We close your incision to leave the smallest and best-looking scar. No ointment or creams on your incisions  for four weeks.  No Neosporin (Too many skin reactions).  A few weeks after surgery you can use Mederma and start massaging the scar. We ask you to wear your binder or sports bra for the first 6 weeks around the clock, including while sleeping. This provides added comfort and helps reduce the fluid accumulation at the surgery site. NO Ice or heating pads to the operative site.  You have a very high risk of a BURN before you feel the temperature change.  ACTIVITY No heavy lifting until cleared by the doctor.  This usually means no more than a half-gallon of milk.  It is OK to walk and climb stairs. Moving your legs is very important to decrease your risk of a blood clot.  It will also help keep you from getting deconditioned.  Every 1 to 2 hours get up and walk for 5 minutes. This will help with a quicker recovery back to normal.  Let pain be your guide so you don't do too much.  This time is for you to recover.  You will be more comfortable if you sleep and rest with your head elevated either with a few pillows under you or in a recliner.  No stomach sleeping for a three months.  WORK Everyone returns to work at different times. As a rough guide, most people take at least 1 - 2 weeks off prior to returning to work. If  you need documentation for your job, give the forms to the front staff at the clinic.  DRIVING Arrange for someone to bring you home from the hospital after your surgery.  You may be able to drive a few days after surgery but not while taking any narcotics or valium.  BOWEL MOVEMENTS Constipation can occur after anesthesia and while taking pain medication.  It is important to stay ahead for your comfort.  We recommend taking Milk of Magnesia (2 tablespoons; twice a day) while taking the pain pills.  MEDICATIONS You may be prescribed should start after surgery At your preoperative visit for you history and physical you may have been given the following medications: An antibiotic: Start  this medication when you get home and take according to the instructions on the bottle. Zofran 4 mg:  This is to treat nausea and vomiting.  You can take this every 6 hours as needed and only if needed. Valium 2 mg for breast cancer patients: This is for muscle tightness if you have an implant or expander. This will help relax your muscle which also helps with pain control.  This can be taken every 12 hours as needed. Don't drive after taking this medication. Oxycodone 5mg :  This is only to be used after you have taken the Motrin or the Tylenol. Every 8 hours as needed. Do not take in conjunction with your chronic Oxycodone 5mg  which you take twice daily. Take additional oxycodone 5mg  as needed for pain control related to surgery.   Over the counter Medication to take: Ibuprofen (Motrin) 600 mg:  Take this every 6 hours.  If you have additional pain then take 500 mg of the Tylenol every 8 hours.  Only take the Norco after you have tried these two. MiraLAX or Milk of Magnesia: Take this according to the bottle if you take the [place] Call your surgeon's office if any of the following occur: Fever 101 degrees F or greater Excessive bleeding or fluid from the incision site. Pain that increases over time without aid from the medications Redness, warmth, or pus draining from incision sites Persistent nausea or inability to take in liquids Severe misshapen area that underwent the operation.  No tylenol until 1pm   Post Anesthesia Home Care Instructions  Activity: Get plenty of rest for the remainder of the day. A responsible individual must stay with you for 24 hours following the procedure.  For the next 24 hours, DO NOT: -Drive a car -Paediatric nurse -Drink alcoholic beverages -Take any medication unless instructed by your physician -Make any legal decisions or sign important papers.  Meals: Start with liquid foods such as gelatin or soup. Progress to regular foods as  tolerated. Avoid greasy, spicy, heavy foods. If nausea and/or vomiting occur, drink only clear liquids until the nausea and/or vomiting subsides. Call your physician if vomiting continues.  Special Instructions/Symptoms: Your throat may feel dry or sore from the anesthesia or the breathing tube placed in your throat during surgery. If this causes discomfort, gargle with warm salt water. The discomfort should disappear within 24 hours.  If you had a scopolamine patch placed behind your ear for the management of post- operative nausea and/or vomiting:  1. The medication in the patch is effective for 72 hours, after which it should be removed.  Wrap patch in a tissue and discard in the trash. Wash hands thoroughly with soap and water. 2. You may remove the patch earlier than 72 hours if  you experience unpleasant side effects which may include dry mouth, dizziness or visual disturbances. 3. Avoid touching the patch. Wash your hands with soap and water after contact with the patch.

## 2022-10-18 NOTE — Transfer of Care (Signed)
Immediate Anesthesia Transfer of Care Note  Patient: Tricia Berry  Procedure(s) Performed: Right breast capsule release with adjustment (Right: Breast)  Patient Location: PACU  Anesthesia Type:General  Level of Consciousness: drowsy, patient cooperative, and responds to stimulation  Airway & Oxygen Therapy: Patient Spontanous Breathing and Patient connected to face mask oxygen  Post-op Assessment: Report given to RN and Post -op Vital signs reviewed and stable  Post vital signs: Reviewed and stable  Last Vitals:  Vitals Value Taken Time  BP    Temp    Pulse    Resp 17 10/18/22 0837  SpO2    Vitals shown include unvalidated device data.  Last Pain:  Vitals:   10/18/22 0639  TempSrc: Oral  PainSc: 0-No pain      Patients Stated Pain Goal: 6 (43/32/95 1884)  Complications: No notable events documented.

## 2022-10-18 NOTE — Interval H&P Note (Signed)
History and Physical Interval Note:  10/18/2022 7:06 AM  Tricia Berry  has presented today for surgery, with the diagnosis of Acquired absence of both breasts, History of left breast cancer, Breast asymmetry following reconstructive surgery.  The various methods of treatment have been discussed with the patient and family. After consideration of risks, benefits and other options for treatment, the patient has consented to  Procedure(s): Right breast capsule release with adjustment (Right) as a surgical intervention.  The patient's history has been reviewed, patient examined, no change in status, stable for surgery.  I have reviewed the patient's chart and labs.  Questions were answered to the patient's satisfaction.     [name] [name]

## 2022-10-18 NOTE — Telephone Encounter (Signed)
Olivia Mackie form plastic surgery call wanted to see if the clearance paper work for patient to get pain medication after her surgery has been fax back in. Olivia Mackie stated that the papers were fax over on 10-12-22 PT will be having surgery on today. Fax number is [phone] and if you have any questions you can call [phone]. Thanks

## 2022-10-18 NOTE — Op Note (Signed)
Op report Unilateral Breast Exchange   DATE OF OPERATION:  10/18/2022  LOCATION: [place]  SURGICAL DIVISION: Plastic Surgery  PREOPERATIVE DIAGNOSES:  1. History of left breast cancer.  2. Acquired absence of bilateral breast.   POSTOPERATIVE DIAGNOSES:  1. History of left breast cancer.  2. Acquired absence of bilateral breast.   PROCEDURE:  1. Right breast capsulectomy for implant respositioning.  SURGEON: Gizzelle Lacomb Sanger Yaseen Gilberg, DO  ASSISTANT: Roetta Sessions, PA  ANESTHESIA:  General.   COMPLICATIONS: None.   INDICATIONS FOR PROCEDURE:  The patient, Tricia Berry, is a 51 y.o. female born on 1971/07/29, is here for further treatment after bilateral mastectomies and placement of breast implants. She now presents for repositioning of the right implant.  She requires capsulectomies to better position the implant. MRN: [id number]  CONSENT:  Informed consent was obtained directly from the patient. Risks, benefits and alternatives were fully discussed. Specific risks including but not limited to bleeding, infection, hematoma, seroma, scarring, pain, implant infection, implant extrusion, capsular contracture, asymmetry, wound healing problems, and need for further surgery were all discussed. The patient did have an ample opportunity to have her questions answered to her satisfaction.   DESCRIPTION OF PROCEDURE:  The patient was taken to the operating room. SCDs were placed and IV antibiotics were given. The patient's chest was prepped and draped in a sterile fashion. A time out was performed and the implants to be used were identified.  One percent Xylocaine with epinephrine was used to infiltrate the area.   Right: The old mastectomy scar was opened and superior mastectomy and inferior mastectomy flaps were re-raised over the pectoralis major muscle. The ADM was split to expose the implant which was removed. Inspection of the pocket showed a normal  healthy capsule but the tissue and skin was very thin.   Superior capsulotomies were performed to allow for breast pocket expansion.  The lateral and inferior capsule was excised for ~ 1 cm.  The capsule was then advanced more medially and superiorly to reposition the capsule.  This was done with 2-0 PDS. Hemostasis was ensured with electrocautery.  The pocket was irrigated with antibiotic solution.  New gloves were placed.  The implant was placed in the pocket and oriented appropriately. The pectoralis major muscle and capsule on the anterior surface were re-closed with a 3-0 running PDS suture. The remaining skin was closed with 3-0 Monocryl deep dermal and 4-0 Monocryl subcuticular stitches.  Dermabond was applied.  A breast binder and ABD was applied.  The patient was allowed to wake from anesthesia and taken to the recovery room in satisfactory condition.   The advanced practice practitioner (APP) assisted throughout the case.  The APP was essential in retraction and counter traction when needed to make the case progress smoothly.  This retraction and assistance made it possible to see the tissue plans for the procedure.  The assistance was needed for blood control, tissue re-approximation and assisted with closure of the incision site.

## 2022-10-19 ENCOUNTER — Encounter (HOSPITAL_BASED_OUTPATIENT_CLINIC_OR_DEPARTMENT_OTHER): Payer: Self-pay | Admitting: Plastic Surgery

## 2022-10-20 ENCOUNTER — Inpatient Hospital Stay: Payer: Commercial Managed Care - HMO

## 2022-10-24 ENCOUNTER — Ambulatory Visit: Payer: 59 | Admitting: Occupational Therapy

## 2022-10-26 NOTE — Progress Notes (Signed)
Patient is a 52 year old female with history of breast cancer.  Patient recently underwent right breast capsulectomy for implant repositioning with Dr. Marla Roe on 10/18/2022.  Patient presents to the clinic for postoperative follow-up.  Today, patient reports she is doing well.  She states that overall her pain is controlled.  She states that the other night she stretched her arm out while she was sleeping and felt a little bit of a pulling sensation.  She denies any fevers or chills.  She denies any redness over the area.  Patient is overall happy with her results.  Chaperone present on exam.  On exam, patient is sitting upright in no acute distress.  Right breast is soft.  There is some mild swelling noted.  There is no overlying erythema or ecchymosis.  There are no significant fluid collections palpated on exam.  Mepilex border dressing was removed.  Incision is intact with Steri-Strips.  There is no surrounding erythema or drainage.  Right breast positioning appears to be improved from preoperative photos.  I discussed with the patient to continue to wear compression at all times and to avoid overhead stretching.  I discussed with the patient she may do some light range of motion to the right upper extremity and to avoid strenuous activities.  Patient expressed understanding.  Patient to follow-up in about 10 days.  I instructed the patient to call in the meantime if she has any questions or concerns.  Pictures were obtained of the patient and placed in the chart with the patient's or guardian's permission.

## 2022-10-27 ENCOUNTER — Encounter: Payer: Self-pay | Admitting: Student

## 2022-10-27 ENCOUNTER — Ambulatory Visit (INDEPENDENT_AMBULATORY_CARE_PROVIDER_SITE_OTHER): Payer: 59 | Admitting: Student

## 2022-10-27 ENCOUNTER — Inpatient Hospital Stay: Payer: 59 | Attending: Oncology

## 2022-10-27 ENCOUNTER — Other Ambulatory Visit (HOSPITAL_COMMUNITY): Payer: Self-pay

## 2022-10-27 VITALS — BP 107/70 | HR 78

## 2022-10-27 DIAGNOSIS — Z5111 Encounter for antineoplastic chemotherapy: Secondary | ICD-10-CM | POA: Insufficient documentation

## 2022-10-27 DIAGNOSIS — Z8041 Family history of malignant neoplasm of ovary: Secondary | ICD-10-CM | POA: Diagnosis not present

## 2022-10-27 DIAGNOSIS — N651 Disproportion of reconstructed breast: Secondary | ICD-10-CM

## 2022-10-27 DIAGNOSIS — Z79899 Other long term (current) drug therapy: Secondary | ICD-10-CM | POA: Insufficient documentation

## 2022-10-27 DIAGNOSIS — C50412 Malignant neoplasm of upper-outer quadrant of left female breast: Secondary | ICD-10-CM | POA: Insufficient documentation

## 2022-10-27 DIAGNOSIS — Z170 Estrogen receptor positive status [ER+]: Secondary | ICD-10-CM

## 2022-10-27 DIAGNOSIS — F17210 Nicotine dependence, cigarettes, uncomplicated: Secondary | ICD-10-CM | POA: Diagnosis not present

## 2022-10-27 MED ORDER — GOSERELIN ACETATE 3.6 MG ~~LOC~~ IMPL
3.6000 mg | DRUG_IMPLANT | SUBCUTANEOUS | Status: DC
  Administered 2022-10-27: 3.6 mg via SUBCUTANEOUS
  Filled 2022-10-27: qty 3.6

## 2022-10-29 MED FILL — Pregabalin Cap 150 MG: ORAL | 30 days supply | Qty: 90 | Fill #1 | Status: AC

## 2022-10-29 MED FILL — Lorazepam Tab 0.5 MG: ORAL | 30 days supply | Qty: 30 | Fill #0 | Status: AC

## 2022-10-30 ENCOUNTER — Other Ambulatory Visit: Payer: Self-pay

## 2022-10-30 ENCOUNTER — Other Ambulatory Visit (HOSPITAL_COMMUNITY): Payer: Self-pay

## 2022-10-31 ENCOUNTER — Other Ambulatory Visit: Payer: Self-pay

## 2022-11-01 ENCOUNTER — Ambulatory Visit (INDEPENDENT_AMBULATORY_CARE_PROVIDER_SITE_OTHER): Payer: 59 | Admitting: Family

## 2022-11-01 ENCOUNTER — Other Ambulatory Visit: Payer: Self-pay

## 2022-11-01 ENCOUNTER — Encounter: Payer: Self-pay | Admitting: Oncology

## 2022-11-01 ENCOUNTER — Encounter: Payer: Self-pay | Admitting: Family

## 2022-11-01 ENCOUNTER — Telehealth: Payer: Self-pay | Admitting: *Deleted

## 2022-11-01 VITALS — BP 116/78 | HR 70 | Temp 97.7°F | Ht 60.0 in | Wt 121.0 lb

## 2022-11-01 DIAGNOSIS — G894 Chronic pain syndrome: Secondary | ICD-10-CM

## 2022-11-01 DIAGNOSIS — J189 Pneumonia, unspecified organism: Secondary | ICD-10-CM | POA: Diagnosis not present

## 2022-11-01 DIAGNOSIS — M47817 Spondylosis without myelopathy or radiculopathy, lumbosacral region: Secondary | ICD-10-CM | POA: Diagnosis not present

## 2022-11-01 DIAGNOSIS — R0602 Shortness of breath: Secondary | ICD-10-CM

## 2022-11-01 DIAGNOSIS — F411 Generalized anxiety disorder: Secondary | ICD-10-CM

## 2022-11-01 LAB — CBC WITH DIFFERENTIAL/PLATELET
Basophils Absolute: 0.1 10*3/uL (ref 0.0–0.1)
Basophils Relative: 0.6 % (ref 0.0–3.0)
Eosinophils Absolute: 0.1 10*3/uL (ref 0.0–0.7)
Eosinophils Relative: 0.8 % (ref 0.0–5.0)
HCT: 42.5 % (ref 36.0–46.0)
Hemoglobin: 14.1 g/dL (ref 12.0–15.0)
Lymphocytes Relative: 33.1 % (ref 12.0–46.0)
Lymphs Abs: 2.9 10*3/uL (ref 0.7–4.0)
MCHC: 33.2 g/dL (ref 30.0–36.0)
MCV: 94.3 fl (ref 78.0–100.0)
Monocytes Absolute: 0.5 10*3/uL (ref 0.1–1.0)
Monocytes Relative: 6.2 % (ref 3.0–12.0)
Neutro Abs: 5.1 10*3/uL (ref 1.4–7.7)
Neutrophils Relative %: 59.3 % (ref 43.0–77.0)
Platelets: 253 10*3/uL (ref 150.0–400.0)
RBC: 4.51 Mil/uL (ref 3.87–5.11)
RDW: 16.2 % — ABNORMAL HIGH (ref 11.5–15.5)
WBC: 8.6 10*3/uL (ref 4.0–10.5)

## 2022-11-01 LAB — SEDIMENTATION RATE: Sed Rate: 59 mm/hr — ABNORMAL HIGH (ref 0–30)

## 2022-11-01 LAB — C-REACTIVE PROTEIN: CRP: <1 mg/dL (ref 0.5–20.0)

## 2022-11-01 MED ORDER — BREZTRI AEROSPHERE 160-9-4.8 MCG/ACT IN AERO
2.0000 | INHALATION_SPRAY | Freq: Two times a day (BID) | RESPIRATORY_TRACT | 11 refills | Status: DC
  Filled 2022-11-01: qty 10.7, 30d supply, fill #0
  Filled 2022-12-04 – 2022-12-19 (×6): qty 10.7, 30d supply, fill #1

## 2022-11-01 MED ORDER — ALBUTEROL SULFATE HFA 108 (90 BASE) MCG/ACT IN AERS
2.0000 | INHALATION_SPRAY | Freq: Four times a day (QID) | RESPIRATORY_TRACT | 0 refills | Status: DC | PRN
  Filled 2022-11-01: qty 6.7, 25d supply, fill #0

## 2022-11-01 NOTE — Progress Notes (Signed)
  Care Coordination   Note   11/01/2022 Name: Tricia Berry MRN: [id number] DOB: April 04, 1971  [name] is a 52 y.o. year old female who sees Arnett, Yvetta Coder, FNP for primary care. I reached out to [employer] by phone today to offer care coordination services.  [name] was given information about Care Coordination services today including:   The Care Coordination services include support from the care team which includes your Nurse Coordinator, Clinical Social Worker, or Pharmacist.  The Care Coordination team is here to help remove barriers to the health concerns and goals most important to you. Care Coordination services are voluntary, and the patient may decline or stop services at any time by request to their care team member.   Care Coordination Consent Status: Patient agreed to services and verbal consent obtained.   Follow up plan:  Telephone appointment with care coordination team member scheduled for:  11/03/2022  Encounter Outcome:  Pt. Scheduled from referral   Julian Hy, [place] Direct Dial: [phone]

## 2022-11-01 NOTE — Progress Notes (Signed)
Assessment & Plan:  SOB (shortness of breath) Assessment & Plan: Discussed Verzenio and side effects is interstitial lung disease/pneumonitis. Discussed consult with Dr Patsey Berthold in regards to her concern after Verzenio.I have sent a note to Eric Form, NP with regard pulmonology in regards to arranging appointment for shortness of breath as well as CT lung cancer screening program.Pending ESR CRP and CBC . Breztri sample provided and I reiterated the importance of her staying on this medication for maintenance versus using as needed.  Prescription has been sent as well  Orders: -     CBC with Differential/Platelet -     Sedimentation rate -     C-reactive protein -     Breztri Aerosphere; Inhale 2 puffs into the lungs 2 (two) times daily.  Dispense: 10.7 g; Refill: 11  Community acquired pneumonia of right lung, unspecified part of lung -     Albuterol Sulfate HFA; Inhale 2 puffs into the lungs every 6 (six) hours as needed for wheezing or shortness of breath.  Dispense: 6.7 g; Refill: 0  Lumbosacral facet arthropathy (Multilevel) (Bilateral) -     Ambulatory referral to Pain Clinic  Chronic pain syndrome -     AMB Referral to [place] [facility]  Anxiety state Assessment & Plan: Chronic, stable.  Continue Prozac 20 mg.      Return precautions given.   Risks, benefits, and alternatives of the medications and treatment plan prescribed today were discussed, and patient expressed understanding.   Education regarding symptom management and diagnosis given to patient on AVS either electronically or printed.  Return in about 3 months (around 01/31/2023).  Mable Paris, FNP  Subjective:    Patient ID: Tricia Berry, female    DOB: 1971/10/05, 52 y.o.   MRN: [id number]  CC: Tricia Berry is a 52 y.o. female who presents today for follow up.   HPI:  She continues to have SOB.  Start breath can occur with sitting or with walking.  She did feel that Breztri inhaler  was helpful and she has used PRN.      Describes episode 6 days ago in which Sao2  dropped to 'low 90's' when still. She didn't seek medication attention. She is unsure of reason for this and this has not recurred.   She had Verzenio 3 months ago, since paused.   She feels very calm and overall pleased with Prozac.    history of longstanding smoking, recently pneumonia seen on CT angio 08/30/2022.   CXR 10/10/22 which shows stable bilateral lung opacities, although acute superimposed inflammation cannot be excluded.  No h/o eosinophilia  Allergies: Morphine, Sertraline hcl, Sulfa antibiotics, Sulfamethoxazole, and Sulfonamide derivatives Current Outpatient Medications on File Prior to Visit  Medication Sig Dispense Refill   anastrozole (ARIMIDEX) 1 MG tablet Take 1 tablet (1 mg total) by mouth daily. 30 tablet 5   baclofen (LIORESAL) 10 MG tablet Take 1 tablet (10 mg total) by mouth 3 (three) times daily as needed for muscle spasms. 15 tablet 1   cyanocobalamin (VITAMIN B12) 1000 MCG/ML injection 1000 mcg (1 mL) intramuscular injection in the thigh ( vastus lateralis) once per month. 3 mL 4   FLUoxetine (PROZAC) 20 MG capsule Take 1 capsule (20 mg total) by mouth every morning. 90 capsule 3   Lactobacillus (PROBIOTIC ACIDOPHILUS PO) Take 1 capsule by mouth daily.     LORazepam (ATIVAN) 0.5 MG tablet Take 1 tablet (0.5 mg total) by mouth at bedtime. 30 tablet 2  Multiple Vitamins-Minerals (MULTIVITAL PO) Take 1 Dose by mouth daily.     naloxone (NARCAN) nasal spray 4 mg/0.1 mL Place 1 spray into the nose as needed for opioid-induced respiratory depresssion. In case of emergency (overdose), spray once into each nostril. If no response within 3 minutes, repeat application and call 099. 2 each 0   nicotine (NICODERM CQ - DOSED IN MG/24 HOURS) 21 mg/24hr patch use 1 patch daily 28 patch 0   pregabalin (LYRICA) 150 MG capsule Take 1 capsule (150 mg total) by mouth in the morning, at noon, and  at bedtime. 90 capsule 2   trimethoprim (TRIMPEX) 100 MG tablet Take 1 tablet (100 mg total) by mouth daily. 90 tablet 3   oxyCODONE (ROXICODONE) 5 MG immediate release tablet Take 1 tablet (5 mg total) by mouth every 8 (eight) hours as needed for up to 5 days for severe pain. 15 tablet 0   Current Facility-Administered Medications on File Prior to Visit  Medication Dose Route Frequency Provider Last Rate Last Admin   acetaminophen (TYLENOL) 325 MG tablet            diphenhydrAMINE (BENADRYL) 25 mg capsule            goserelin (ZOLADEX) injection 3.6 mg  3.6 mg Subcutaneous Q28 days Sindy Guadeloupe, MD   3.6 mg at 05/04/21 1150   heparin lock flush 100 unit/mL  500 Units Intravenous Once Sindy Guadeloupe, MD       Zoledronic Acid (ZOMETA) 4 MG/100ML IVPB             Review of Systems  Constitutional:  Negative for chills and fever.  Respiratory:  Positive for cough and shortness of breath. Negative for wheezing.   Cardiovascular:  Negative for chest pain and palpitations.  Gastrointestinal:  Negative for nausea and vomiting.      Objective:    BP 116/78   Pulse 70   Temp 97.7 F (36.5 C) (Oral)   Ht 5' (1.524 m)   Wt 121 lb (54.9 kg)   SpO2 98%   BMI 23.63 kg/m  BP Readings from Last 3 Encounters:  11/01/22 116/78  10/27/22 107/70  10/18/22 100/66   Wt Readings from Last 3 Encounters:  11/01/22 121 lb (54.9 kg)  10/18/22 121 lb 4.1 oz (55 kg)  10/09/22 121 lb 12.8 oz (55.2 kg)    Physical Exam Vitals reviewed.  Constitutional:      Appearance: She is well-developed.  Eyes:     Conjunctiva/sclera: Conjunctivae normal.  Cardiovascular:     Rate and Rhythm: Normal rate and regular rhythm.     Pulses: Normal pulses.     Heart sounds: Normal heart sounds.  Pulmonary:     Effort: Pulmonary effort is normal.     Breath sounds: Normal breath sounds. No wheezing, rhonchi or rales.  Skin:    General: Skin is warm and dry.  Neurological:     Mental Status: She is alert.   Psychiatric:        Speech: Speech normal.        Behavior: Behavior normal.        Thought Content: Thought content normal.

## 2022-11-01 NOTE — Patient Instructions (Signed)
I placed a referral to [facility], social work to see if they can help with insurance.  Referral to pain management also placed.    I have also reached out to pulmonology as you can be scheduled with them.  Please monitor oxygen and most certainly if it were to drop less than 90%, please call [phone].  Please continue Judithann Sauger which is a maintenance inhaler

## 2022-11-01 NOTE — Telephone Encounter (Signed)
Hi sarah,   This patient has a longstanding history of smoking, persistent shortness of breath after pneumonia 08/2022.  I placed a referral to your office for CT lung cancer screening program.  I think she would benefit from a consult to discuss COPD and CT lung cancer screen  Can you arrange appt for her?   I placed a referral to you 10/10/22 and it has not been scheduled.   Thank you so much  [name]

## 2022-11-01 NOTE — Assessment & Plan Note (Addendum)
Discussed Verzenio and side effects is interstitial lung disease/pneumonitis. Discussed consult with Dr Patsey Berthold in regards to her concern after Verzenio.I have sent a note to Eric Form, NP with regard pulmonology in regards to arranging appointment for shortness of breath as well as CT lung cancer screening program.Pending ESR CRP and CBC . Breztri sample provided and I reiterated the importance of her staying on this medication for maintenance versus using as needed.  Prescription has been sent as well

## 2022-11-01 NOTE — Assessment & Plan Note (Addendum)
Chronic, stable.  Continue Prozac 20 mg.

## 2022-11-02 ENCOUNTER — Other Ambulatory Visit: Payer: Self-pay

## 2022-11-02 ENCOUNTER — Other Ambulatory Visit: Payer: Self-pay | Admitting: Family

## 2022-11-02 DIAGNOSIS — R899 Unspecified abnormal finding in specimens from other organs, systems and tissues: Secondary | ICD-10-CM

## 2022-11-03 ENCOUNTER — Encounter: Payer: Self-pay | Admitting: *Deleted

## 2022-11-06 ENCOUNTER — Other Ambulatory Visit: Payer: Self-pay

## 2022-11-07 ENCOUNTER — Ambulatory Visit (INDEPENDENT_AMBULATORY_CARE_PROVIDER_SITE_OTHER): Payer: 59 | Admitting: Plastic Surgery

## 2022-11-07 ENCOUNTER — Encounter: Payer: Self-pay | Admitting: Plastic Surgery

## 2022-11-07 VITALS — BP 124/83 | HR 87 | Ht 60.0 in | Wt 122.0 lb

## 2022-11-07 DIAGNOSIS — Z9889 Other specified postprocedural states: Secondary | ICD-10-CM

## 2022-11-07 DIAGNOSIS — N651 Disproportion of reconstructed breast: Secondary | ICD-10-CM

## 2022-11-07 DIAGNOSIS — Z9013 Acquired absence of bilateral breasts and nipples: Secondary | ICD-10-CM

## 2022-11-07 DIAGNOSIS — C50412 Malignant neoplasm of upper-outer quadrant of left female breast: Secondary | ICD-10-CM

## 2022-11-07 DIAGNOSIS — Z170 Estrogen receptor positive status [ER+]: Secondary | ICD-10-CM

## 2022-11-07 NOTE — Progress Notes (Signed)
   Subjective:    Patient ID: Tricia Berry, female    DOB: [birth year], 52 y.o.   MRN: [id number]  The patient is a 52 year old female here for follow-up after undergoing breast surgery 2 weeks ago.  She had a bilateral mastectomy surgery with immediate reconstruction in May 2022.  She then had exchange to implants in July 2022 and has 320 cc saline implants in both breasts.  She had some drooping of the right implant so 10/18/2022 we did a revision.  She seems to be doing well from the surgery and the implant is in nice position.  Patient is pleased with the current results.      Review of Systems  Constitutional: Negative.   Eyes: Negative.   Respiratory: Negative.    Cardiovascular: Negative.   Gastrointestinal: Negative.   Endocrine: Negative.   Genitourinary: Negative.   Musculoskeletal: Negative.        Objective:   Physical Exam Constitutional:      Appearance: Normal appearance.  Cardiovascular:     Rate and Rhythm: Normal rate.     Pulses: Normal pulses.  Abdominal:     Palpations: Abdomen is soft.  Skin:    Capillary Refill: Capillary refill takes less than 2 seconds.  Neurological:     Mental Status: She is alert and oriented to person, place, and time.  Psychiatric:        Mood and Affect: Mood normal.        Behavior: Behavior normal.        Thought Content: Thought content normal.       Assessment & Plan:     ICD-10-CM   1. Malignant neoplasm of upper-outer quadrant of left breast in female, estrogen receptor positive (HCC)  C50.412    Z17.0     2. Breast asymmetry following reconstructive surgery  N65.1     3. Acquired absence of both breasts  Z90.13        Pictures were obtained of the patient and placed in the chart with the patient's or guardian's permission. We will set the patient up for nipple areola tattooing.  She has a follow-up in 2 weeks.  I will then plan to see her at least in a 1 year timeframe for a yearly visit.  Patient knows  to let us know if she has any questions or concerns.  At this time she is thinking she might not need fat filling.

## 2022-11-08 ENCOUNTER — Telehealth: Payer: Self-pay | Admitting: *Deleted

## 2022-11-08 ENCOUNTER — Ambulatory Visit: Payer: Self-pay | Admitting: *Deleted

## 2022-11-08 ENCOUNTER — Other Ambulatory Visit: Payer: Self-pay

## 2022-11-08 NOTE — Patient Outreach (Addendum)
  Care Coordination   Initial Visit Note   11/08/2022 Name: EMALEA MIX MRN: [id number] DOB: 1971-10-18  Juluis Mire Mccalister is a 52 y.o. year old female who sees Arnett, Lyn Records, FNP for primary care. I spoke with  Juluis Mire Touch by phone today.  What matters to the patients health and wellness today?  Insurance coverage and income options    Goals Addressed             This Visit's Progress    "I need more information on insurance coverage "       Care Coordination Interventions: Patient confirms that her [place] insurance will end at the end of the month and her LT disability ends at the end of March-per patient, she has applied for disability but was denied Patient to be covered by [place] for the month of February at $1000.00 per month Patient requesting additional information on possibly applying for medicaid following her [employer] coverage to cover her medical care Currently household bills are paid, however concerned about her income following the  LT disability that will end in March Discussed possibility of applying for [facility] through [facility] as well as discussing additional options with the Cancer Center social worker-VM message left requesting a return call Discussed plan to apply for food stamps as well  This social worker will research additional options and will follow up within 1 week         SDOH assessments and interventions completed:  Yes  SDOH Interventions Today    Flowsheet Row Most Recent Value  SDOH Interventions   Food Insecurity Interventions Intervention Not Indicated  Housing Interventions Intervention Not Indicated        Care Coordination Interventions:  Yes, provided   Follow up plan: Follow up call scheduled for 11/13/22    Encounter Outcome:  Pt. Visit Completed

## 2022-11-08 NOTE — Patient Instructions (Addendum)
Visit Information  Thank you for taking time to visit with me today. Please don't hesitate to contact me if I can be of assistance to you.   Following are the goals we discussed today:   Goals Addressed             This Visit's Progress    "I need more information on insurance coverage "       Care Coordination Interventions: Patient confirms that her [employer] insurance will end at the end of the month and her LT disability ends at the end of March-per patient, she has applied for disability but was denied Patient to be covered by [place] for the month of February at $1000.00 per month Patient requesting additional information on possibly applying for medicaid following her [employer] coverage to cover her medical care Currently household bills are paid, however concerned about her income following the  LT disability that will end in March Discussed possibility of applying for [facility] through [facility] as well as discussing additional options with the [facility] social worker-VM message left requesting a return call Discussed plan to apply for food stamps as well  This social worker will research additional options and will follow up within 1 week         Our next appointment is by telephone on 11/13/22 at 1:30pm  Please call the care guide team at [phone] if you need to cancel or reschedule your appointment.   If you are experiencing a Mental Health or Behavioral Health Crisis or need someone to talk to, please call [phone]   Patient verbalizes understanding of instructions and care plan provided today and agrees to view in MyChart. Active MyChart status and patient understanding of how to access instructions and care plan via MyChart confirmed with patient.     Telephone follow up appointment with care management team member scheduled for: 11/13/22  Verna Czech, LCSW Clinical Social Worker  [facility] [phone]

## 2022-11-08 NOTE — Patient Outreach (Signed)
  Care Coordination   11/08/2022 Name: Tricia Berry MRN: [id number] DOB: [birth year]   Care Coordination Outreach Attempts:  An unsuccessful telephone outreach was attempted for a scheduled appointment today.  Follow Up Plan:  Additional outreach attempts will be made to offer the patient care coordination information and services.   Encounter Outcome:  No Answer   Care Coordination Interventions:  No, not indicated    Deissy Guilbert, LCSW Clinical Social Worker  [facility] Care Management [phone]

## 2022-11-09 ENCOUNTER — Ambulatory Visit: Payer: 59 | Attending: Nurse Practitioner | Admitting: Occupational Therapy

## 2022-11-09 DIAGNOSIS — I972 Postmastectomy lymphedema syndrome: Secondary | ICD-10-CM | POA: Diagnosis not present

## 2022-11-10 ENCOUNTER — Inpatient Hospital Stay: Payer: 59

## 2022-11-10 ENCOUNTER — Inpatient Hospital Stay (HOSPITAL_BASED_OUTPATIENT_CLINIC_OR_DEPARTMENT_OTHER): Payer: 59 | Admitting: Oncology

## 2022-11-10 ENCOUNTER — Encounter: Payer: Self-pay | Admitting: Occupational Therapy

## 2022-11-10 ENCOUNTER — Encounter: Payer: Self-pay | Admitting: Oncology

## 2022-11-10 VITALS — BP 115/87 | HR 83 | Temp 97.8°F | Resp 17 | Wt 125.0 lb

## 2022-11-10 DIAGNOSIS — Z5111 Encounter for antineoplastic chemotherapy: Secondary | ICD-10-CM | POA: Diagnosis not present

## 2022-11-10 DIAGNOSIS — Z79899 Other long term (current) drug therapy: Secondary | ICD-10-CM | POA: Diagnosis not present

## 2022-11-10 DIAGNOSIS — G620 Drug-induced polyneuropathy: Secondary | ICD-10-CM

## 2022-11-10 DIAGNOSIS — Z5181 Encounter for therapeutic drug level monitoring: Secondary | ICD-10-CM

## 2022-11-10 DIAGNOSIS — Z79811 Long term (current) use of aromatase inhibitors: Secondary | ICD-10-CM | POA: Diagnosis not present

## 2022-11-10 DIAGNOSIS — Z8041 Family history of malignant neoplasm of ovary: Secondary | ICD-10-CM | POA: Diagnosis not present

## 2022-11-10 DIAGNOSIS — Z170 Estrogen receptor positive status [ER+]: Secondary | ICD-10-CM

## 2022-11-10 DIAGNOSIS — C50412 Malignant neoplasm of upper-outer quadrant of left female breast: Secondary | ICD-10-CM

## 2022-11-10 DIAGNOSIS — Z7983 Long term (current) use of bisphosphonates: Secondary | ICD-10-CM

## 2022-11-10 DIAGNOSIS — F17210 Nicotine dependence, cigarettes, uncomplicated: Secondary | ICD-10-CM | POA: Diagnosis not present

## 2022-11-10 LAB — COMPREHENSIVE METABOLIC PANEL
ALT: 26 U/L (ref 0–44)
AST: 26 U/L (ref 15–41)
Albumin: 4 g/dL (ref 3.5–5.0)
Alkaline Phosphatase: 68 U/L (ref 38–126)
Anion gap: 10 (ref 5–15)
BUN: 11 mg/dL (ref 6–20)
CO2: 24 mmol/L (ref 22–32)
Calcium: 9.1 mg/dL (ref 8.9–10.3)
Chloride: 104 mmol/L (ref 98–111)
Creatinine, Ser: 0.94 mg/dL (ref 0.44–1.00)
GFR, Estimated: >60 mL/min (ref >60)
Glucose, Bld: 126 mg/dL — ABNORMAL HIGH (ref 70–99)
Potassium: 4 mmol/L (ref 3.5–5.1)
Sodium: 138 mmol/L (ref 135–145)
Total Bilirubin: 0.5 mg/dL (ref 0.3–1.2)
Total Protein: 7.5 g/dL (ref 6.5–8.1)

## 2022-11-10 LAB — CBC WITH DIFFERENTIAL/PLATELET
Abs Immature Granulocytes: 0.04 10*3/uL (ref 0.00–0.07)
Basophils Absolute: 0.1 10*3/uL (ref 0.0–0.1)
Basophils Relative: 1 %
Eosinophils Absolute: 0.1 10*3/uL (ref 0.0–0.5)
Eosinophils Relative: 2 %
HCT: 44.4 % (ref 36.0–46.0)
Hemoglobin: 14.2 g/dL (ref 12.0–15.0)
Immature Granulocytes: 1 %
Lymphocytes Relative: 44 %
Lymphs Abs: 3.5 10*3/uL (ref 0.7–4.0)
MCH: 30.5 pg (ref 26.0–34.0)
MCHC: 32 g/dL (ref 30.0–36.0)
MCV: 95.3 fL (ref 80.0–100.0)
Monocytes Absolute: 0.6 10*3/uL (ref 0.1–1.0)
Monocytes Relative: 8 %
Neutro Abs: 3.5 10*3/uL (ref 1.7–7.7)
Neutrophils Relative %: 44 %
Platelets: 217 10*3/uL (ref 150–400)
RBC: 4.66 MIL/uL (ref 3.87–5.11)
RDW: 14.6 % (ref 11.5–15.5)
WBC: 7.8 10*3/uL (ref 4.0–10.5)
nRBC: 0 % (ref 0.0–0.2)

## 2022-11-10 NOTE — Progress Notes (Signed)
Hematology/Oncology Consult note [facility]  Telephone:([phone] Fax:([phone]) [phone]  Patient Care Team: Allegra Grana, FNP as PCP - General (Family Medicine) Debbe Odea, MD as PCP - Cardiology (Cardiology) Jim Like, RN as Oncology Nurse Navigator Creig Hines, MD as Consulting Physician (Hematology and Oncology) Keitha Butte, RN as Registered Nurse (Oncology) Borders, Daryl Eastern, NP as Nurse Practitioner [facility] and Palliative Medicine)   Name of the patient: Tricia Berry  [id number]  05-08-1971   Date of visit: 11/10/22  Diagnosis- invasive mammary carcinoma of the left breast stage III ypT2 ypN2 cM0 ER/PR positive and HER2 positive  Chief complaint/ Reason for visit-routine follow-up of his cancer  Heme/Onc history: patient is a 52 year old female who noticed a left breast lump for about a year but did not seek medical attention as she was out of medical insurance and did not have a PCP.  She then noticed that her lump is gradually getting larger with an area of ulceration on the skin and underwent a diagnostic bilateral mammogram on 09/08/2020 which showed hypoechoic interconnected masses in the left breast from 10:00 to 2 o'clock position spanning at least an area of 4.8 cm.  Ultrasound also showed 5 abnormal lymph nodes in the axilla with cortical thickening.  Both the mass and the lymph nodes were biopsied and was consistent with invasive mammary carcinoma grade 2.  Tumor was ER +91- 100%, PR +11 to 20% and HER-2 negative.  Ki-67 15%.    PET CT scan showed hypermetabolism in the area of the left breast but no evidence of hypermetabolism in the left axilla Or evidence of distant metastatic disease.   Neoadjuvant dose dense ACT chemotherapy started on 10/08/2020. Interim ultrasound after 4 cycles of dose dense AC chemotherapy showed overall decrease in volume of the tumor.  Nodularity previously seen on ultrasound was  also decreased in size.   Patient completed neoadjuvant AC Taxol chemotherapy and underwent bilateral mastectomy with reconstruction.Final pathology showed fibrocystic changes in the right breast with no malignancy.  3.6 cm invasive mammary carcinoma in the left breast.  7 out of 16 lymph nodes positive for malignancy.  Treatment effect in the breast present but not robust.  Treatment effect in the lymph nodes minimal.  Margins negative.  Overall grade 2.  Extranodal extension present.  ER greater than 90% positive, PR 15% positive.  HER2 +2 equivocal and positive by FISH on the primary breast specimen.  HER2 testing was however negative in the lymph nodes.  Patient completed 1 year of adjuvant Herceptin and Perjeta in August 2023.  Interval history-patient is concerned about restarting her Verzenio which she was taking at 100 mg twice daily but held it in November 2023 when she was going to have her breast reconstruction surgery.  Given during that time she was having significant nausea fatigue and inability to continue with her ADLs.  She continues to have issues with back pain as well as peripheral neuropathy.  Patient reports occasional right-sided jaw pain.  She uses dentures  ECOG PS- 1 Pain scale- 3   Review of systems- Review of Systems  Constitutional:  Positive for malaise/fatigue. Negative for chills, fever and weight loss.  HENT:  Negative for congestion, ear discharge and nosebleeds.   Eyes:  Negative for blurred vision.  Respiratory:  Negative for cough, hemoptysis, sputum production, shortness of breath and wheezing.   Cardiovascular:  Negative for chest pain, palpitations, orthopnea and claudication.  Gastrointestinal:  Negative for  abdominal pain, blood in stool, constipation, diarrhea, heartburn, melena, nausea and vomiting.  Genitourinary:  Negative for dysuria, flank pain, frequency, hematuria and urgency.  Musculoskeletal:  Negative for back pain, joint pain and myalgias.   Skin:  Negative for rash.  Neurological:  Positive for sensory change (Peripheral neuropathy). Negative for dizziness, tingling, focal weakness, seizures, weakness and headaches.  Endo/Heme/Allergies:  Does not bruise/bleed easily.  Psychiatric/Behavioral:  Negative for depression and suicidal ideas. The patient does not have insomnia.       Allergies  Allergen Reactions   Morphine Nausea And Vomiting and Other (See Comments)    migranes Other reaction(s): Headache Migraine, vomiting   Sertraline Hcl     REACTION: Worsened symptoms of IBS   Sulfa Antibiotics Rash   Sulfamethoxazole Rash   Sulfonamide Derivatives Rash     Past Medical History:  Diagnosis Date   Anxiety    Breast cancer (HCC) 12/2021   left breast IMC   Diverticulitis    Family history of ovarian cancer    GERD (gastroesophageal reflux disease)    IBS (irritable bowel syndrome)    Pneumonia    PONV (postoperative nausea and vomiting)    severe migraine and vomiting post anesthesia   Scoliosis      Past Surgical History:  Procedure Laterality Date   ABDOMINAL HYSTERECTOMY     still has ovaries, no gyn cancer, hysterectomy due to endometriosis. NO cervix on exam 01/10/21   BREAST BIOPSY Left 09/15/2020   [place] bx of mass, path pending, Q marker   BREAST BIOPSY Left 09/15/2020   [place] bx of LN, hydromarker, path pending   BREAST RECONSTRUCTION WITH PLACEMENT OF TISSUE EXPANDER AND FLEX HD (ACELLULAR HYDRATED DERMIS) Bilateral 03/17/2021   Procedure: IMMEDIATE BILATERAL BREAST RECONSTRUCTION WITH PLACEMENT OF TISSUE EXPANDER AND FLEX HD (ACELLULAR HYDRATED DERMIS);  Surgeon: Peggye Form, DO;  Location: [facility];  Service: Plastics;  Laterality: Bilateral;   CAPSULECTOMY Right 10/18/2022   Procedure: Right breast capsule release with adjustment;  Surgeon: Peggye Form, DO;  Location: [facility];  Service: Plastics;  Laterality: Right;   COLONOSCOPY WITH PROPOFOL N/A  11/21/2021   Procedure: COLONOSCOPY WITH PROPOFOL;  Surgeon: Toney Reil, MD;  Location: [facility];  Service: Gastroenterology;  Laterality: N/A;   IR IMAGING GUIDED PORT INSERTION  10/01/2020   MODIFIED MASTECTOMY Left 03/17/2021   Procedure: LEFT MODIFIED RADICAL MASTECTOMY;  Surgeon: Harriette Bouillon, MD;  Location: [facility];  Service: General;  Laterality: Left;   PORTA CATH REMOVAL Right 03/17/2021   Procedure: PORTA CATH REMOVAL;  Surgeon: Harriette Bouillon, MD;  Location: [facility];  Service: General;  Laterality: Right;   REMOVAL OF BILATERAL TISSUE EXPANDERS WITH PLACEMENT OF BILATERAL BREAST IMPLANTS Bilateral 05/09/2021   Procedure: REMOVAL OF BILATERAL TISSUE EXPANDERS WITH PLACEMENT OF BILATERAL BREAST IMPLANTS;  Surgeon: Peggye Form, DO;  Location: [facility];  Service: Plastics;  Laterality: Bilateral;  90 min   TOTAL MASTECTOMY Right 03/17/2021   Procedure: TOTAL MASTECTOMY;  Surgeon: Harriette Bouillon, MD;  Location: [facility];  Service: General;  Laterality: Right;    Social History   Socioeconomic History   Marital status: Divorced    Spouse name: Not on file   Number of children: 2   Years of education: Not on file   Highest education level: Not on file  Occupational History   Occupation: Nurse    Employer: [employer]  Tobacco  Use   Smoking status: Every Day    Packs/day: 0.50    Types: Cigarettes   Smokeless tobacco: Never   Tobacco comments:    Pt trying to quit now. Has reduced amount significantly  Vaping Use   Vaping Use: Never used  Substance and Sexual Activity   Alcohol use: Not Currently   Drug use: No   Sexual activity: Not Currently    Birth control/protection: Surgical    Comment: hyst  Other Topics Concern   Not on file  Social History Narrative   Patient works as an Insurance underwriter at [employer]. She has 2 children at home who have special needs. She and her spouse are primary  caregivers.    Social Determinants of Health   Financial Resource Strain: Not on file  Food Insecurity: No Food Insecurity (11/08/2022)   Hunger Vital Sign    Worried About Running Out of Food in the Last Year: Never true    Ran Out of Food in the Last Year: Never true  Transportation Needs: No Transportation Needs (11/08/2022)   PRAPARE - Administrator, Civil Service (Medical): No    Lack of Transportation (Non-Medical): No  Physical Activity: Not on file  Stress: Not on file  Social Connections: Not on file  Intimate Partner Violence: Not on file    Family History  Problem Relation Age of Onset   Hypertension Mother    Osteoarthritis Mother    Diverticulitis Mother    Heart failure Father    Hypertension Father    Gout Father    Heart attack Father 84   Diverticulitis Brother    Ovarian cancer Paternal Grandmother      Current Outpatient Medications:    albuterol (VENTOLIN HFA) 108 (90 Base) MCG/ACT inhaler, Inhale 2 puffs into the lungs every 6 (six) hours as needed for wheezing or shortness of breath., Disp: 6.7 g, Rfl: 0   anastrozole (ARIMIDEX) 1 MG tablet, Take 1 tablet (1 mg total) by mouth daily., Disp: 30 tablet, Rfl: 5   baclofen (LIORESAL) 10 MG tablet, Take 1 tablet (10 mg total) by mouth 3 (three) times daily as needed for muscle spasms., Disp: 15 tablet, Rfl: 1   Budeson-Glycopyrrol-Formoterol (BREZTRI AEROSPHERE) 160-9-4.8 MCG/ACT AERO, Inhale 2 puffs into the lungs 2 (two) times daily., Disp: 10.7 g, Rfl: 11   cyanocobalamin (VITAMIN B12) 1000 MCG/ML injection, 1000 mcg (1 mL) intramuscular injection in the thigh ( vastus lateralis) once per month., Disp: 3 mL, Rfl: 4   FLUoxetine (PROZAC) 20 MG capsule, Take 1 capsule (20 mg total) by mouth every morning., Disp: 90 capsule, Rfl: 3   Lactobacillus (PROBIOTIC ACIDOPHILUS PO), Take 1 capsule by mouth daily., Disp: , Rfl:    LORazepam (ATIVAN) 0.5 MG tablet, Take 1 tablet (0.5 mg total) by mouth at  bedtime., Disp: 30 tablet, Rfl: 2   Multiple Vitamins-Minerals (MULTIVITAL PO), Take 1 Dose by mouth daily., Disp: , Rfl:    naloxone (NARCAN) nasal spray 4 mg/0.1 mL, Place 1 spray into the nose as needed for opioid-induced respiratory depresssion. In case of emergency (overdose), spray once into each nostril. If no response within 3 minutes, repeat application and call 911., Disp: 2 each, Rfl: 0   nicotine (NICODERM CQ - DOSED IN MG/24 HOURS) 21 mg/24hr patch, use 1 patch daily, Disp: 28 patch, Rfl: 0   oxyCODONE (ROXICODONE) 5 MG immediate release tablet, Take 1 tablet (5 mg total) by mouth every 8 (eight) hours as needed for  up to 5 days for severe pain., Disp: 15 tablet, Rfl: 0   pregabalin (LYRICA) 150 MG capsule, Take 1 capsule (150 mg total) by mouth in the morning, at noon, and at bedtime., Disp: 90 capsule, Rfl: 2   rosuvastatin (CRESTOR) 5 MG tablet, Take 5 mg by mouth daily., Disp: , Rfl:    trimethoprim (TRIMPEX) 100 MG tablet, Take 1 tablet (100 mg total) by mouth daily., Disp: 90 tablet, Rfl: 3 No current facility-administered medications for this visit.  Facility-Administered Medications Ordered in Other Visits:    acetaminophen (TYLENOL) 325 MG tablet, , , ,    diphenhydrAMINE (BENADRYL) 25 mg capsule, , , ,    goserelin (ZOLADEX) injection 3.6 mg, 3.6 mg, Subcutaneous, Q28 days, Randa Evens C, MD, 3.6 mg at 05/04/21 1150   heparin lock flush 100 unit/mL, 500 Units, Intravenous, Once, Sindy Guadeloupe, MD   Zoledronic Acid (ZOMETA) 4 MG/100ML IVPB, , , ,   Physical exam:  Vitals:   11/10/22 0942  BP: 115/87  Pulse: 83  Resp: 17  Temp: 97.8 F (36.6 C)  TempSrc: Tympanic  SpO2: 99%  Weight: 125 lb (56.7 kg)   Physical Exam Constitutional:      General: She is not in acute distress. Cardiovascular:     Rate and Rhythm: Normal rate and regular rhythm.     Heart sounds: Normal heart sounds.  Pulmonary:     Effort: Pulmonary effort is normal.     Breath sounds: Normal  breath sounds.  Skin:    General: Skin is warm and dry.  Neurological:     Mental Status: She is alert and oriented to person, place, and time.         Latest Ref Rng & Units 11/10/2022    9:13 AM  CMP  Glucose 70 - 99 mg/dL 126   BUN 6 - 20 mg/dL 11   Creatinine 0.44 - 1.00 mg/dL 0.94   Sodium 135 - 145 mmol/L 138   Potassium 3.5 - 5.1 mmol/L 4.0   Chloride 98 - 111 mmol/L 104   CO2 22 - 32 mmol/L 24   Calcium 8.9 - 10.3 mg/dL 9.1   Total Protein 6.5 - 8.1 g/dL 7.5   Total Bilirubin 0.3 - 1.2 mg/dL 0.5   Alkaline Phos 38 - 126 U/L 68   AST 15 - 41 U/L 26   ALT 0 - 44 U/L 26       Latest Ref Rng & Units 11/10/2022    9:13 AM  CBC  WBC 4.0 - 10.5 K/uL 7.8   Hemoglobin 12.0 - 15.0 g/dL 14.2   Hematocrit 36.0 - 46.0 % 44.4   Platelets 150 - 400 K/uL 217      Assessment and plan- Patient is a 52 y.o. female with history of stage III left breast cancer ER/PR positive and mixed pathology of HER2 positive and HER2 negative.  She is here for follow-up of following issues:  Breast cancer: She is currently on ovarian suppression plus Arimidex.  Hormone levels were not conclusively postmenopausal in October 2023 and she is therefore receiving monthly Zoladex injections.  Patient is concerned about using adjuvant Verzenio.  She was having side effects even at 100 mg twice daily.  We discussed retrying it at the same dose versus trying it at a reduced dose of 50 mg twice daily versus not doing it at all.  After discussion of risks versus benefits patient would like to hold off on doing further Verzenio.  Patient is also receiving adjuvant Zometa every 3 months and will be due for her next dose in 1 month.  Patient does not have any open sores in her mouth although she reports right-sided jaw pain.  I was unable to examine her buccal mucosa as she had dentures on.  If pain worsens I will hold Zometa and refer her to dentist although I do not think that her symptoms are related to  Zometa.  Chemo-induced peripheral neuropathy:Continue pregabalin.  She is also on as needed oxycodone for her chronic back pain which is unrelated to her malignancy.  She has appointment coming up with pain clinic this month  I will see her back in 4 months when she is due for her next dose of Zometa   Visit Diagnosis 1. Malignant neoplasm of upper-outer quadrant of left breast in female, estrogen receptor positive (HCC)   2. Visit for monitoring Arimidex therapy   3. Encounter for monitoring zoledronic acid therapy      Dr. Owens Shark, MD, MPH [facility] [phone] 11/10/2022 4:03 PM

## 2022-11-10 NOTE — Therapy (Signed)
OUTPATIENT OCCUPATIONAL THERAPY TREATMENT NOTE POST MASTECTOMY LUE/LUQ UPPER EXTREMITY LYMPHEDEMA  Patient Name: Tricia Berry MRN: [id number] DOB:May 26, 1971, 52 y.o., female Today's Date: 11/10/2022    OT End of Session - 11/09/22 1420     Visit Number 5    Number of Visits 36    Date for OT Re-Evaluation 11/20/22    OT Start Time 0205    OT Stop Time 0300    OT Time Calculation (min) 55 min    Activity Tolerance Patient tolerated treatment well;No increased pain    Behavior During Therapy [facility] for tasks assessed/performed              Past Medical History:  Diagnosis Date   Anxiety    Breast cancer (HCC) 12/2021   left breast IMC   Diverticulitis    Family history of ovarian cancer    GERD (gastroesophageal reflux disease)    IBS (irritable bowel syndrome)    Pneumonia    PONV (postoperative nausea and vomiting)    severe migraine and vomiting post anesthesia   Scoliosis    Past Surgical History:  Procedure Laterality Date   ABDOMINAL HYSTERECTOMY     still has ovaries, no gyn cancer, hysterectomy due to endometriosis. NO cervix on exam 01/10/21   BREAST BIOPSY Left 09/15/2020   [place] bx of mass, path pending, Q marker   BREAST BIOPSY Left 09/15/2020   [place] bx of LN, hydromarker, path pending   BREAST RECONSTRUCTION WITH PLACEMENT OF TISSUE EXPANDER AND FLEX HD (ACELLULAR HYDRATED DERMIS) Bilateral 03/17/2021   Procedure: IMMEDIATE BILATERAL BREAST RECONSTRUCTION WITH PLACEMENT OF TISSUE EXPANDER AND FLEX HD (ACELLULAR HYDRATED DERMIS);  Surgeon: Peggye Form, DO;  Location: [facility];  Service: Plastics;  Laterality: Bilateral;   CAPSULECTOMY Right 10/18/2022   Procedure: Right breast capsule release with adjustment;  Surgeon: Peggye Form, DO;  Location: [facility];  Service: Plastics;  Laterality: Right;   COLONOSCOPY WITH PROPOFOL N/A 11/21/2021   Procedure: COLONOSCOPY WITH PROPOFOL;  Surgeon: Toney Reil,  MD;  Location: [facility];  Service: Gastroenterology;  Laterality: N/A;   IR IMAGING GUIDED PORT INSERTION  10/01/2020   MODIFIED MASTECTOMY Left 03/17/2021   Procedure: LEFT MODIFIED RADICAL MASTECTOMY;  Surgeon: Harriette Bouillon, MD;  Location: [facility];  Service: General;  Laterality: Left;   PORTA CATH REMOVAL Right 03/17/2021   Procedure: PORTA CATH REMOVAL;  Surgeon: Harriette Bouillon, MD;  Location: [facility];  Service: General;  Laterality: Right;   REMOVAL OF BILATERAL TISSUE EXPANDERS WITH PLACEMENT OF BILATERAL BREAST IMPLANTS Bilateral 05/09/2021   Procedure: REMOVAL OF BILATERAL TISSUE EXPANDERS WITH PLACEMENT OF BILATERAL BREAST IMPLANTS;  Surgeon: Peggye Form, DO;  Location: [facility];  Service: Plastics;  Laterality: Bilateral;  90 min   TOTAL MASTECTOMY Right 03/17/2021   Procedure: TOTAL MASTECTOMY;  Surgeon: Harriette Bouillon, MD;  Location: [facility];  Service: General;  Laterality: Right;   Patient Active Problem List   Diagnosis Date Noted   Chronic sacroiliac joint pain (Bilateral) 10/03/2022    Class: Chronic   Chronic hip pain (Left) 10/03/2022    Class: Chronic   CAP (community acquired pneumonia) 08/29/2022   SOB (shortness of breath) 08/29/2022   Breast asymmetry following reconstructive surgery 08/15/2022   Lumbosacral facet arthropathy (Multilevel) (Bilateral) 07/05/2022   Lumbar spinal facet joint arthropathy with concurrent effusion (L3-4) (Right) 07/05/2022   Degenerative lumbar rotatory levoscoliosis 07/05/2022   Lumbosacral  foraminal stenosis (Bilateral: L3-4) (Left: L4-5, L5-S1) 07/05/2022   Lumbosacral lateral recess stenosis (Left: L4-5) (Bilateral: L5-S1) 07/05/2022   Lumbar nerve root impingement (Bilateral: L3-4, L5-S1) (Left: L4-5) 07/05/2022   Long term prescription benzodiazepine use 07/05/2022   Chronic use of opiate for therapeutic purpose 07/05/2022   Lumbosacral facet  syndrome (Bilateral) 07/05/2022   Pain medication agreement signed (12/07/21) 05/29/2022   History of breast cancer (Left) 05/29/2022   History of mastectomy (Bilateral) 05/29/2022   Chronic calf pain (2ry area of Pain) (Bilateral) 05/29/2022   Chronic lower extremity pain (3ry area of Pain) (Bilateral) 05/29/2022   Chronic feet pain (Bilateral) 05/29/2022   Chronic upper extremity pain (4th area of Pain) (Bilateral) (L>R) 05/29/2022   Chronic hand pain (Bilateral) (L>R) 05/29/2022   Chronic generalized pain (5th area of Pain) 05/29/2022   Abnormal MRI, lumbar spine (10/13/2021) 05/29/2022   Chronic pain syndrome 05/28/2022   Pharmacologic therapy 05/28/2022   Disorder of skeletal system 05/28/2022   Problems influencing health status 05/28/2022   Family history of heart disease 11/16/2021   Acquired absence of breast 08/16/2021   B12 deficiency 07/21/2021   Atherosclerosis of aorta (HCC) 07/20/2021   Hepatic steatosis 07/20/2021   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HCC) 03/14/2021   Genetic testing 11/22/2020   Family history of ovarian cancer    Candidal vulvovaginitis 11/01/2020   Chronic low back pain (1ry area of Pain) (Bilateral) (R>L) w/o sciatica 10/06/2020   Encounter for medical examination to establish care 09/26/2020   Malignant neoplasm of upper-outer quadrant of left breast in female, estrogen receptor positive (HCC) 09/26/2020   Sprain of lumbar region 03/08/2010   INTERNAL HEMORRHOIDS 01/27/2010   Anal fissure 01/27/2010   Osteoarthrosis, hand 01/04/2010   Arthralgia 10/05/2009   INSOMNIA, CHRONIC 05/06/2009   ALLERGIC RHINITIS, SEASONAL 02/09/2009   TOBACCO ABUSE 12/29/2008   IBS 10/13/2008   Anxiety state 08/25/2008   DEPRESSION, RECURRENT 08/25/2008    PCP: Allegra Grana, FPN  REFERRING PROVIDER: Alinda Dooms, NP  REFERRING DIAG: 197.2  THERAPY DIAG:  LUE/ LUQ Post-mastectomy lymphedema syndrome  ONSET DATE: 03/17/21  SUBJECTIVE                                                                                                                                                                                            SUBJECTIVE STATEMENT:Tricia Berry presents for OT to address L breast cancer related lymphedema. She was last seen for OT on 10/06/22. She reports she called to cancel several scheduled OT appointments, but did not leave a message , or convey to staff why she cancelled. Pt reports she has been  dealing with multiple health issues during the interval. Pt is here today to resume manual therapy for L axillary cording and to follow up on obtaining custom compression garments funded by [employer] that we worked on measuring for back in December.   PERTINENT HISTORY: L Breast Ca ER+, PR+, HER2-, stage III  L breast biopsy 09/15/20; Neoadjuvant chemotherapy; L modified radical mastectomy and R prophylactic total mastectomy with immediate placement of tissue expanders 03/17/21; L ALND 7+/17. XRT completed 02/18/21. Marland Kitchen  Upcoming R capsulectomy scheduled 09/07/22. Chronic pain syndrome, chronic insomnia, OA B hands, Tobacco abuse, IBS, Anxiety, Recurrent Depression   PAIN:  Are you having pain? Yes. Unchanged since 10/06/22 NPRS scale: not rated numerically Pain location: L lateral trunk, axilla, volar arm and forearm, chest wall Pain orientation L PAIN TYPE: pulling, discomfort, fullness, tightness Pain description: intermittent Aggravating factors: end range shoulder AROM, lifting, carrying, stretching Relieving factors: MLD, compression  PRECAUTIONS: Falls,  Lymphedema Precautions  HAND DOMINANCE : right   PRIOR LEVEL OF FUNCTION: Independent  PATIENT GOALS: make sure lymphedema doesn't get worse; reduce pain and discomfort in my hands and arm and trunk   OBJECTIVE   LYMPHEDEMA ASSESSMENTS:   FOTO FUNCTIONAL OUTCOME SCORE: Intake 59/100%  LYMPHEDEMA LIFE IMPACT SCALE (LLIS) Intake TBA 1st Rx visit. Pt did  not complete backside of page  TODAY'S TREATMENT:  Pt edu  PATIENT EDUCATION:  Education details: Pt Discussed roles of patient, DME vendor, therapist and insurance , and [employer]. Reviewed [employer] application process and necessary financial documents.for Intensive and self management phases of CDT. Reviewed rehab attendance policy and importance of leaving a reason with VM when calling out. 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 Ensure pad covers serratus anterior and L lateral trunk as well as chest wall. Pt is of petite stature so custom pas and sleeve may be required to achieve correct fit RECOMMENDED COMPRESSION: Custom Juzo, ccl 1 (20-30 mmHg) compression arm sleeve, x 2 Custom ccl 1 Juzo glove, x 2 JoviPak lumpectomy pad Belisse compression bras , x 2 Unable to fit with off the shelf compression arm sleeve as Pt is too petite to fit into manufacturer's size range for smallest size 1  ASSESSMENT:  CLINICAL IMPRESSION: Pt demonstrates non-compliance with OT POC for lymphedema management by poor attendance and limited communication. To date, therefore, she has made minimal progress towards OT goals. She was not fitted with custom compression sleeve, glove and bra we measured for at her last visit on 10/06/22 because she did not return to therapy and did not provide necessary financial  documentation to complete order with DME provided. Pt would like to resume care to address L axillary cording. We agreed continue manual therapy using 2 remaining scheduled visits. After that we'll schedule one visit at a time / PRN. Pt tells me she completed purchase of recommended custom garments using insurance benefits today. .   OBJECTIVE IMPAIRMENTS: decreased activity tolerance, decreased balance, decreased endurance, decreased knowledge of condition, decreased knowledge of use of DME, impaired LUE shoulder AROM,  decreased strength, impaired flexibility, impaired sensation, impaired UE functional use, postural dysfunction, impaired dynamic balance, muscle weakness, pain, and increased lymphedema progression risk.   ACTIVITY LIMITATIONS: carrying, lifting, sleeping, bed mobility, reach over head, hygiene/grooming, caring for others, working as Engineer, civil (consulting), driving, meal prep, cooking  PARTICIPATION LIMITATIONS: cleaning, laundry, driving, shopping, occupation, and yard work  PERSONAL FACTORS: 3+ comorbidities: chronic pain, lumbar stenosis, chemo-induced neuropathy in all 4 limbs  are also affecting patient's functional outcome.   REHAB POTENTIAL: Good for limiting LE progression   GOALS: Goals reviewed with patient? Yes  SHORT TERM GOALS: Target date: 4th OT Rx visit    Pt will demonstrate understanding of lymphedema precautions and prevention strategies with modified independence using a printed reference to identify at least 5 precautions and discussing how s/he may implement them into daily life to reduce risk of progression and to limit infection risk. Baseline:Max A Goal status: 11/09/22 NOT MET   LONG TERM GOALS: Target date: 02/02/2023    Given this patient's Intake score of 59/100% on the functional outcomes FOTO tool, patient will experience an increase in function of 3 points  to improve basic and instrumental ADLs performance, including lymphedema self-care. Baseline:  59/100% Goal status:  11/09/22 NOT MET  2.  Given this patient's Intake score of TBA/100% on the Lymphedema Life Impact Scale (LLIS), patient will experience an increase of 5 points in her perceived level of functional impairment resulting from lymphedema to improve functional performance and quality of life (QOL). Baseline: TBA Goal status:  11/09/22 NOT MET  3.  With assistive devices and modified independence (extra time) Pt will be able to don and doff appropriate compression garments and/or devices to control BLE lymphedema and to limit progression.  Baseline: Max A Goal status:  11/09/22 NOT MET  4.  Pt will be able to correctly perform all lymphedema self-care home program components using correct techniques with extra time and assistive devices PRN (modified independence), including simple self MLD, lymphatic pumping exercise, don and doff appropriate compression garments/ devices, and perform daily skin care regime to limit progression. Baseline: Max A Goal status:  11/09/22 NOT MET  PLAN:  PT FREQUENCY: 1-2x/week  PT DURATION: 12 weeks  PLANNED INTERVENTIONS: Therapeutic exercises, Therapeutic activity, Patient/Family education, Self Care, DME instructions, Manual lymph drainage, Taping, Manual therapy, and compression garment measurement , fitting and training  PLAN FOR NEXT SESSION:  Complete compression glove measurements Further assess AWS Have Pt complete LLIS Complete initial volumetrics using percentages and limb volume differential vs circumferences.  Loel Dubonnet, MS, OTR/L, CLT-LANA 11/10/22 1:23 PM

## 2022-11-10 NOTE — Progress Notes (Signed)
Patient here for oncology follow-up appointment, concerns of SOB, has apt with pulmonologist

## 2022-11-13 ENCOUNTER — Ambulatory Visit: Payer: 59 | Attending: Surgery

## 2022-11-13 ENCOUNTER — Ambulatory Visit: Payer: Self-pay | Admitting: *Deleted

## 2022-11-13 VITALS — Wt 126.1 lb

## 2022-11-13 DIAGNOSIS — Z483 Aftercare following surgery for neoplasm: Secondary | ICD-10-CM | POA: Insufficient documentation

## 2022-11-13 NOTE — Patient Outreach (Signed)
  Care Coordination   11/13/2022 Name: Tricia Berry MRN: [id number] DOB: 03-25-1971   Care Coordination Outreach Attempts:  An unsuccessful telephone outreach was attempted for a scheduled appointment today.  Follow Up Plan:  Additional outreach attempts will be made to offer the patient care coordination information and services.   Encounter Outcome:  No Answer   Care Coordination Interventions:  No, not indicated    Atalia Litzinger, LCSW Clinical Social Worker  [facility] Care Management [phone]

## 2022-11-13 NOTE — Therapy (Signed)
OUTPATIENT PHYSICAL THERAPY SOZO SCREENING NOTE   Patient Name: Tricia Berry MRN: [id number] DOB:[birth year], 52 y.o., female Today's Date: 11/13/2022  PCP: Allegra Grana, FNP REFERRING PROVIDER: Harriette Bouillon, MD   PT End of Session - 11/13/22 1012     Visit Number 23   # unchanged due to scren only   PT Start Time 1009    PT Stop Time 1019    PT Time Calculation (min) 10 min    Activity Tolerance Patient tolerated treatment well    Behavior During Therapy [facility] for tasks assessed/performed             Past Medical History:  Diagnosis Date   Anxiety    Breast cancer (HCC) 12/2021   left breast IMC   Diverticulitis    Family history of ovarian cancer    GERD (gastroesophageal reflux disease)    IBS (irritable bowel syndrome)    Pneumonia    PONV (postoperative nausea and vomiting)    severe migraine and vomiting post anesthesia   Scoliosis    Past Surgical History:  Procedure Laterality Date   ABDOMINAL HYSTERECTOMY     still has ovaries, no gyn cancer, hysterectomy due to endometriosis. NO cervix on exam 01/10/21   BREAST BIOPSY Left 09/15/2020   [place] bx of mass, path pending, Q marker   BREAST BIOPSY Left 09/15/2020   [place] bx of LN, hydromarker, path pending   BREAST RECONSTRUCTION WITH PLACEMENT OF TISSUE EXPANDER AND FLEX HD (ACELLULAR HYDRATED DERMIS) Bilateral 03/17/2021   Procedure: IMMEDIATE BILATERAL BREAST RECONSTRUCTION WITH PLACEMENT OF TISSUE EXPANDER AND FLEX HD (ACELLULAR HYDRATED DERMIS);  Surgeon: Peggye Form, DO;  Location: [facility];  Service: Plastics;  Laterality: Bilateral;   CAPSULECTOMY Right 10/18/2022   Procedure: Right breast capsule release with adjustment;  Surgeon: Peggye Form, DO;  Location: [facility];  Service: Plastics;  Laterality: Right;   COLONOSCOPY WITH PROPOFOL N/A 11/21/2021   Procedure: COLONOSCOPY WITH PROPOFOL;  Surgeon: Toney Reil, MD;  Location: [facility];  Service: Gastroenterology;  Laterality: N/A;   IR IMAGING GUIDED PORT INSERTION  10/01/2020   MODIFIED MASTECTOMY Left 03/17/2021   Procedure: LEFT MODIFIED RADICAL MASTECTOMY;  Surgeon: Harriette Bouillon, MD;  Location: [facility];  Service: General;  Laterality: Left;   PORTA CATH REMOVAL Right 03/17/2021   Procedure: PORTA CATH REMOVAL;  Surgeon: Harriette Bouillon, MD;  Location: [facility];  Service: General;  Laterality: Right;   REMOVAL OF BILATERAL TISSUE EXPANDERS WITH PLACEMENT OF BILATERAL BREAST IMPLANTS Bilateral 05/09/2021   Procedure: REMOVAL OF BILATERAL TISSUE EXPANDERS WITH PLACEMENT OF BILATERAL BREAST IMPLANTS;  Surgeon: Peggye Form, DO;  Location: [facility];  Service: Plastics;  Laterality: Bilateral;  90 min   TOTAL MASTECTOMY Right 03/17/2021   Procedure: TOTAL MASTECTOMY;  Surgeon: Harriette Bouillon, MD;  Location: [facility];  Service: General;  Laterality: Right;   Patient Active Problem List   Diagnosis Date Noted   Chronic sacroiliac joint pain (Bilateral) 10/03/2022    Class: Chronic   Chronic hip pain (Left) 10/03/2022    Class: Chronic   CAP (community acquired pneumonia) 08/29/2022   SOB (shortness of breath) 08/29/2022   Breast asymmetry following reconstructive surgery 08/15/2022   Lumbosacral facet arthropathy (Multilevel) (Bilateral) 07/05/2022   Lumbar spinal facet joint arthropathy with concurrent effusion (L3-4) (Right) 07/05/2022   Degenerative lumbar rotatory levoscoliosis 07/05/2022   Lumbosacral foraminal stenosis (Bilateral: L3-4) (Left:  L4-5, L5-S1) 07/05/2022   Lumbosacral lateral recess stenosis (Left: L4-5) (Bilateral: L5-S1) 07/05/2022   Lumbar nerve root impingement (Bilateral: L3-4, L5-S1) (Left: L4-5) 07/05/2022   Long term prescription benzodiazepine use 07/05/2022   Chronic use of opiate for therapeutic purpose 07/05/2022   Lumbosacral facet syndrome (Bilateral)  07/05/2022   Pain medication agreement signed (12/07/21) 05/29/2022   History of breast cancer (Left) 05/29/2022   History of mastectomy (Bilateral) 05/29/2022   Chronic calf pain (2ry area of Pain) (Bilateral) 05/29/2022   Chronic lower extremity pain (3ry area of Pain) (Bilateral) 05/29/2022   Chronic feet pain (Bilateral) 05/29/2022   Chronic upper extremity pain (4th area of Pain) (Bilateral) (L>R) 05/29/2022   Chronic hand pain (Bilateral) (L>R) 05/29/2022   Chronic generalized pain (5th area of Pain) 05/29/2022   Abnormal MRI, lumbar spine (10/13/2021) 05/29/2022   Chronic pain syndrome 05/28/2022   Pharmacologic therapy 05/28/2022   Disorder of skeletal system 05/28/2022   Problems influencing health status 05/28/2022   Family history of heart disease 11/16/2021   Acquired absence of breast 08/16/2021   B12 deficiency 07/21/2021   Atherosclerosis of aorta (HCC) 07/20/2021   Hepatic steatosis 07/20/2021   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HCC) 03/14/2021   Genetic testing 11/22/2020   Family history of ovarian cancer    Candidal vulvovaginitis 11/01/2020   Chronic low back pain (1ry area of Pain) (Bilateral) (R>L) w/o sciatica 10/06/2020   Encounter for medical examination to establish care 09/26/2020   Malignant neoplasm of upper-outer quadrant of left breast in female, estrogen receptor positive (HCC) 09/26/2020   Sprain of lumbar region 03/08/2010   INTERNAL HEMORRHOIDS 01/27/2010   Anal fissure 01/27/2010   Osteoarthrosis, hand 01/04/2010   Arthralgia 10/05/2009   INSOMNIA, CHRONIC 05/06/2009   ALLERGIC RHINITIS, SEASONAL 02/09/2009   TOBACCO ABUSE 12/29/2008   IBS 10/13/2008   Anxiety state 08/25/2008   DEPRESSION, RECURRENT 08/25/2008    REFERRING DIAG: left breast cancer at risk for lymphedema  THERAPY DIAG:  Aftercare following surgery for neoplasm  PERTINENT HISTORY: : L breast cancer ER+/PR+ HER2-, stage III, underwent a bilateral mastectomy and ALND on L  (7/16) on 03/17/21, reconstruction in summer 2022, neuropathy of hands, lower legs and feet, completed radiation, completed radiation Dec17, 2021 to February 18, 2021  PRECAUTIONS: left UE Lymphedema risk, None  SUBJECTIVE: They found a mass in my brain and I have to have another MRI.   PAIN:  Not currently having any pain  SOZO SCREENING: Patient was assessed today using the SOZO machine to determine the lymphedema index score. This was compared to her baseline score. It was determined that she is within the recommended range when compared to her baseline and no further action is needed at this time. She will continue SOZO screenings. These are done every 3 months for 2 years post operatively followed by every 6 months for 2 years, and then annually.      Hermenia Bers, PTA 11/13/2022, 10:19 AM

## 2022-11-14 ENCOUNTER — Ambulatory Visit (INDEPENDENT_AMBULATORY_CARE_PROVIDER_SITE_OTHER): Payer: Self-pay | Admitting: Surgical

## 2022-11-14 ENCOUNTER — Other Ambulatory Visit (HOSPITAL_COMMUNITY): Payer: Self-pay

## 2022-11-14 VITALS — BP 112/77 | HR 93

## 2022-11-14 DIAGNOSIS — C50412 Malignant neoplasm of upper-outer quadrant of left female breast: Secondary | ICD-10-CM

## 2022-11-14 DIAGNOSIS — Z170 Estrogen receptor positive status [ER+]: Secondary | ICD-10-CM

## 2022-11-14 DIAGNOSIS — N651 Disproportion of reconstructed breast: Secondary | ICD-10-CM

## 2022-11-14 DIAGNOSIS — Z9013 Acquired absence of bilateral breasts and nipples: Secondary | ICD-10-CM

## 2022-11-14 DIAGNOSIS — Z853 Personal history of malignant neoplasm of breast: Secondary | ICD-10-CM

## 2022-11-14 NOTE — Progress Notes (Signed)
   Referring Provider Allegra Grana, FNP [address] [place],  [place] [address]   CC:  Chief Complaint  Patient presents with   Consult      Tricia Berry is an 52 y.o. female.  HPI: Patient is a 52 year old female here for follow-up on her bilateral breast reconstruction.  She does have a history of radiation to the left breast which ended approximately 07/2021  She most recently underwent release of right breast capsule and capsular adjustment on 10/18/2022 with Dr. Ulice Bold.  She is healing well from this.  She has no issues at this point.  She is here to discuss nipple areola tattooing and see if that is an option she would like to pursue.  Of note she is scheduled to have an MRI of her brain November 27, 2022  Review of Systems General: No fevers or chills  Physical Exam    11/14/2022    9:50 AM 11/13/2022   10:11 AM 11/10/2022    9:42 AM  Vitals with BMI  Weight  126 lbs 2 oz 125 lbs  BMI  24.63 24.41  Systolic 112  115  Diastolic 77  87  Pulse 93  83    General:  No acute distress,  Alert and oriented, Non-Toxic, Normal speech and affect  Bilateral breast: Left breast incision is intact and well-healed, right breast Steri-Strips in place.  Appear to be healing well, no surrounding erythema or cellulitic changes.  Incision is intact, CDI.  No subcutaneous fluid collections noted of either breast.  Bilateral breast capsules are soft.  Assessment/Plan 52 year old female with history of bilateral breast reconstruction, history of left breast radiation which ended greater than 12 months ago.  She is interested in nipple areola tattooing.  We discussed various options for nipple areola coloring, we selected a few color options.  We tried a various colors such as fair peach, cool honey, fair honey, bright peach, cool peach and warm peach.  Pictures were placed in her chart with her permission.  We discussed waiting until the end of February to plan  for tattooing to allow time for her right breast incision to heal more.  Kermit Balo Micah Galeno 11/14/2022, 11:26 AM

## 2022-11-14 NOTE — Progress Notes (Unsigned)
PROVIDER NOTE: Information contained herein reflects review and annotations entered in association with encounter. Interpretation of such information and data should be left to medically-trained personnel. Information provided to patient can be located elsewhere in the medical record under "Patient Instructions". Document created using STT-dictation technology, any transcriptional errors that may result from process are unintentional.    Patient: Tricia Berry  Service Category: E/M  Provider: Oswaldo Done, MD  DOB: 1971-06-28  DOS: 11/15/2022  Referring Provider: Allegra Grana, FNP  MRN: [id number]  Specialty: Interventional Pain Management  PCP: Allegra Grana, FNP  Type: Established Patient  Setting: Ambulatory outpatient    Location: Office  Delivery: Face-to-face     HPI  Ms. Tricia Berry, a 52 y.o. year old female, is here today because of her Chronic pain syndrome [G89.4]. Tricia Berry primary complain today is No chief complaint on file. Last encounter: My last encounter with her was on 10/18/2022. Pertinent problems: Tricia Berry has Osteoarthrosis, hand; Arthralgia; Sprain of lumbar region; Malignant neoplasm of upper-outer quadrant of left breast in female, estrogen receptor positive (HCC); Chronic low back pain (1ry area of Pain) (Bilateral) (R>L) w/o sciatica; 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HCC); Chronic pain syndrome; History of breast cancer (Left); History of mastectomy (Bilateral); Chronic calf pain (2ry area of Pain) (Bilateral); Chronic lower extremity pain (3ry area of Pain) (Bilateral); Chronic feet pain (Bilateral); Chronic upper extremity pain (4th area of Pain) (Bilateral) (L>R); Chronic hand pain (Bilateral) (L>R); Chronic generalized pain (5th area of Pain); Abnormal MRI, lumbar spine (10/13/2021); Lumbosacral facet arthropathy (Multilevel) (Bilateral); 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 Pain Assessment: Severity of   is reported as a  /10. Location:    / . Onset:  . Quality:  . Timing:  . Modifying factor(s):  Marland Kitchen Vitals:  vitals were not taken for this visit.  BMI: Estimated body mass index is 24.63 kg/m as calculated from the following:   Height as of 11/07/22: 5' (1.524 m).   Weight as of 11/13/22: 126 lb 2 oz (57.2 kg).  Reason for encounter: medication management. ***  PMP: It indicates the patient to have received a prescription for oxycodone IR 5 mg tablets (# 15) written by Kermit Balo Scheeler on 10/18/2022.  The last prescription I wrote for this patient was on 10/03/2022 and filled on 10/18/2022, the same date that she had the other prescription filled.  According to electronic medical record the patient had a right breast capsule release with adjustment by plastic surgery on 10/18/2022.  RTCB: 12/14/2022   Plan: Diagnostic right L3-4 lumbar facet Blk #1 (IA + MBB) (L2, L3)   Pharmacotherapy Assessment  Analgesic: Oxycodone IR 10 mg tablet, 1 tab p.o. daily (# 30) (last filled on 06/19/2022) MME/day: 15 mg/day   Monitoring: [place] PMP: PDMP reviewed during this encounter.       Pharmacotherapy: No side-effects or adverse reactions reported. Compliance: No problems identified. Effectiveness: Clinically acceptable.  No notes on file  No results found for: "CBDTHCR" No results found for: "D8THCCBX" No results found for: "D9THCCBX"  UDS:  Summary  Date Value Ref Range Status  05/29/2022 Note  Final    Comment:    ==================================================================== Compliance  Drug  Analysis, Ur ==================================================================== Test                             Result       Flag       Units  Drug Present and Declared for Prescription Verification   Lorazepam                      1231         EXPECTED   ng/mg creat    Source of lorazepam is a scheduled prescription medication.    Oxycodone                      1391         EXPECTED   ng/mg creat   Noroxycodone                   5467         EXPECTED   ng/mg creat    Sources of oxycodone include scheduled prescription medications.    Noroxycodone is an expected metabolite of oxycodone.    Pregabalin                     PRESENT      EXPECTED   Fluoxetine                     PRESENT      EXPECTED   Norfluoxetine                  PRESENT      EXPECTED    Norfluoxetine is an expected metabolite of fluoxetine.  Drug Present not Declared for Prescription Verification   Oxazepam                       376          UNEXPECTED ng/mg creat   Temazepam                      41           UNEXPECTED ng/mg creat    Oxazepam and temazepam are expected metabolites of diazepam.    Oxazepam is also an expected metabolite of other benzodiazepine    drugs, including chlordiazepoxide, prazepam, clorazepate, halazepam,    and temazepam.  Oxazepam and temazepam are available as scheduled    prescription medications.    Acetaminophen                  PRESENT      UNEXPECTED  Drug Absent but Declared for Prescription Verification   Tizanidine                     Not Detected UNEXPECTED    Tizanidine, as indicated in the declared medication list, is not    always detected even when used as directed.    Prochlorperazine               Not Detected UNEXPECTED   Lidocaine                      Not Detected UNEXPECTED    Lidocaine, as indicated in the declared medication list, is not    always detected even when used as directed.  ==================================================================== Test  Result    Flag   Units      Ref Range   Creatinine              54               mg/dL      >=95 ==================================================================== Declared Medications:  The flagging and interpretation on this report are based on the  following declared medications.  Unexpected results may arise from  inaccuracies in the declared medications.   **Note: The testing scope of this panel includes these medications:   Fluoxetine (Prozac)  Lorazepam (Ativan)  Oxycodone  Pregabalin (Lyrica)  Prochlorperazine (Compazine)   **Note: The testing scope of this panel does not include small to  moderate amounts of these reported medications:   Tizanidine (Zanaflex)  Topical Lidocaine (EMLA)   **Note: The testing scope of this panel does not include the  following reported medications:   Abemaciclib (Verzenio)  Anastrozole (Arimidex)  Calcium  Multivitamin  Nicotine  Ondansetron (Zofran)  Prilocaine (EMLA)  Probiotic  Rosuvastatin (Crestor)  Supplement  Trimethoprim  Vitamin B12  Vitamin D ==================================================================== For clinical consultation, please call [phone]. ====================================================================       ROS  Constitutional: Denies any fever or chills Gastrointestinal: No reported hemesis, hematochezia, vomiting, or acute GI distress Musculoskeletal: Denies any acute onset joint swelling, redness, loss of ROM, or weakness Neurological: No reported episodes of acute onset apraxia, aphasia, dysarthria, agnosia, amnesia, paralysis, loss of coordination, or loss of consciousness  Medication Review  Budeson-Glycopyrrol-Formoterol, FLUoxetine, LORazepam, Lactobacillus, Multiple Vitamins-Minerals, albuterol, anastrozole, baclofen, cyanocobalamin, naloxone, nicotine, pregabalin, rosuvastatin, and trimethoprim  History Review  Allergy: Tricia Berry is allergic to  morphine, sertraline hcl, sulfa antibiotics, sulfamethoxazole, and sulfonamide derivatives. Drug: Tricia Berry  reports no history of drug use. Alcohol:  reports that she does not currently use alcohol. Tobacco:  reports that she has been smoking cigarettes. She has been smoking an average of .5 packs per day. She has never used smokeless tobacco. Social: Tricia Berry  reports that she has been smoking cigarettes. She has been smoking an average of .5 packs per day. She has never used smokeless tobacco. She reports that she does not currently use alcohol. She reports that she does not use drugs. Medical:  has a past medical history of Anxiety, Breast cancer (HCC) (12/2021), Diverticulitis, Family history of ovarian cancer, GERD (gastroesophageal reflux disease), IBS (irritable bowel syndrome), Pneumonia, PONV (postoperative nausea and vomiting), and Scoliosis. Surgical: Ms. Amadi  has a past surgical history that includes Abdominal hysterectomy; Breast biopsy (Left, 09/15/2020); Breast biopsy (Left, 09/15/2020); IR IMAGING GUIDED PORT INSERTION (10/01/2020); Modified mastectomy (Left, 03/17/2021); Total mastectomy (Right, 03/17/2021); PORTA CATH REMOVAL (Right, 03/17/2021); Breast reconstruction with placement of tissue expander and flex hd (acellular hydrated dermis) (Bilateral, 03/17/2021); Removal of bilateral tissue expanders with placement of bilateral breast implants (Bilateral, 05/09/2021); Colonoscopy with propofol (N/A, 11/21/2021); and Capsulectomy (Right, 10/18/2022). Family: family history includes Diverticulitis in her brother and mother; Gout in her father; Heart attack (age of onset: 33) in her father; Heart failure in her father; Hypertension in her father and mother; Osteoarthritis in her mother; Ovarian cancer in her paternal grandmother.  Laboratory Chemistry Profile   Renal Lab Results  Component Value Date   BUN 11 11/10/2022   CREATININE 0.94 11/10/2022   GFR 90.14 09/08/2022    GFRAA >90 05/09/2012   GFRNONAA >60 11/10/2022    Hepatic Lab Results  Component Value Date   AST 26 11/10/2022  ALT 26 11/10/2022   ALBUMIN 4.0 11/10/2022   ALKPHOS 68 11/10/2022    Electrolytes Lab Results  Component Value Date   NA 138 11/10/2022   K 4.0 11/10/2022   CL 104 11/10/2022   CALCIUM 9.1 11/10/2022   MG 1.9 09/12/2022    Bone Lab Results  Component Value Date   25OHVITD1 38 05/29/2022   25OHVITD2 1.2 05/29/2022   25OHVITD3 37 05/29/2022    Inflammation (CRP: Acute Phase) (ESR: Chronic Phase) Lab Results  Component Value Date   CRP <1.0 11/01/2022   ESRSEDRATE 59 (H) 11/01/2022         Note: Above Lab results reviewed.  Recent Imaging Review  DG Chest 2 View CLINICAL DATA:  Bilateral pneumonia.  EXAM: CHEST - 2 VIEW  COMPARISON:  September 08, 2022.  FINDINGS: The heart size and mediastinal contours are within normal limits. Stable bilateral patchy airspace opacities are noted most consistent with scarring, although acute superimposed inflammation cannot be excluded. Stable scoliosis of visualized thoracic and lumbar spine is noted.  IMPRESSION: Stable bilateral lung opacities are noted most consistent with scarring, although acute superimposed inflammation cannot be excluded.  Electronically Signed   By: Lupita Raider M.D.   On: 10/10/2022 14:43 Note: Reviewed        Physical Exam  General appearance: Well nourished, well developed, and well hydrated. In no apparent acute distress Mental status: Alert, oriented x 3 (person, place, & time)       Respiratory: No evidence of acute respiratory distress Eyes: PERLA Vitals: There were no vitals taken for this visit. BMI: Estimated body mass index is 24.63 kg/m as calculated from the following:   Height as of 11/07/22: 5' (1.524 m).   Weight as of 11/13/22: 126 lb 2 oz (57.2 kg). Ideal: Ideal body weight: 45.5 kg (100 lb 4.9 oz) Adjusted ideal body weight: 50.2 kg (110 lb 10.2  oz)  Assessment   Diagnosis Status  1. Chronic pain syndrome   2. Chronic low back pain (1ry area of Pain) (Bilateral) (R>L) w/o sciatica   3. Chronic calf pain (2ry area of Pain) (Bilateral)   4. Chronic lower extremity pain (3ry area of Pain) (Bilateral)   5. Chronic upper extremity pain (4th area of Pain) (Bilateral) (L>R)   6. Chronic generalized pain (5th area of Pain)   7. Lumbar nerve root impingement (Bilateral: L3-4, L5-S1) (Left: L4-5)   8. 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 Lumbosacral lateral recess stenosis (Left: L4-5) (Bilateral: L5-S1)   10. Lumbosacral facet syndrome (Bilateral)   11. Lumbosacral facet arthropathy (Multilevel) (Bilateral)   12. History of breast cancer (Left)   13. 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 Degenerative lumbar rotatory levoscoliosis   15. Chronic feet pain (Bilateral)   16. Pharmacologic therapy   17. Chronic use of opiate for therapeutic purpose   18. Encounter for medication management   19. Encounter for chronic pain management   20. Abnormal MRI, lumbar spine (10/13/2021)    Controlled Controlled Controlled   Updated Problems: No problems updated.  Plan of Care  Problem-specific:  No problem-specific Assessment & Plan notes found for this encounter.  Tricia Berry has a current medication list which includes the following long-term medication(s): albuterol, fluoxetine, and pregabalin.  Pharmacotherapy (Medications Ordered): No orders of the defined types were placed in this encounter.  Orders:  No orders of the defined types were placed in  this encounter.  Follow-up plan:   No follow-ups on file.     Interventional Therapies  Risk  Complexity Considerations:   Hx of Breast CA  chemotherapy-induced neuropathy  Hepatic steatosis  tobacco abuse  anxiety  depression  SOB  IBS  GERD   Planned  Pending:   Diagnostic  right L3-4 lumbar facet Blk #1 (IA + MBB) (L2, L3)  Pending LE EMG/PNCV    Under consideration:   Diagnostic/therapeutic (Midline) caudal ESI + epidurogram #1  Diagnostic/therapeutic bilateral L3 transforaminal ESI #1  Diagnostic bilateral lumbar facet MBB (L2-S1) (to include the L3-4 level) #1    Completed:   Diagnostic bilateral lumbar facet MBB (L3-S1) x1 (09/19/2022) (4-0/10) (100/100/L50R0)    Completed by other providers:   Therapeutic bilateral L3, L4, and L5 MB RFA x1 (02/16/2022) by Merri Ray, DO  Diagnostic bilateral L3-5 MBB x2 (12/30/2021; 01/17/2022) by Merri Ray, DO  Therapeutic right L5-S1 TESI + right S1 TESI x1 (11/29/2021) by Merri Ray, DO  Therapeutic sacral trigger point inj. 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    Therapeutic  Palliative (PRN) options:   None established     Recent Visits Date Type Provider Dept  10/03/22 Office Visit Delano Metz, MD [facility]  09/19/22 Procedure visit Delano Metz, MD [facility]  09/13/22 Office Visit Delano Metz, MD [facility]  Showing recent visits within past 90 days and meeting all other requirements Future Appointments Date Type Provider Dept  11/15/22 Appointment Delano Metz, MD [facility]  Showing future appointments within next 90 days and meeting all other requirements  I discussed the assessment and treatment plan with the patient. The patient was provided an opportunity to ask questions and all were answered. The patient agreed with the plan and demonstrated an understanding of the instructions.  Patient advised to call back or seek an in-person evaluation if the symptoms or condition worsens.  Duration of encounter: *** minutes.   Total time on encounter, as per AMA guidelines included both the face-to-face and non-face-to-face time personally spent by the physician and/or other qualified health care professional(s) on the day of the encounter (includes time in activities that require the physician or other qualified health care professional and does not include time in activities normally performed by clinical staff). Physician's time may include the following activities when performed: Preparing to see the patient (e.g., pre-charting review of records, searching for previously ordered imaging, lab work, and nerve conduction tests) Review of prior analgesic pharmacotherapies. Reviewing PMP Interpreting ordered tests (e.g., lab work, imaging, nerve conduction tests) Performing post-procedure evaluations, including interpretation of diagnostic procedures Obtaining and/or reviewing separately obtained history Performing a medically appropriate examination and/or evaluation Counseling and educating the patient/family/caregiver Ordering medications, tests, or procedures Referring and communicating with other health care professionals (when not separately reported) Documenting clinical information in the electronic or other health record Independently interpreting results (not separately reported) and communicating results to the patient/ family/caregiver Care coordination (not separately reported)  Note by: Oswaldo Done, MD Date: 11/15/2022; Time: 4:21 PM

## 2022-11-15 ENCOUNTER — Telehealth: Payer: Self-pay | Admitting: *Deleted

## 2022-11-15 ENCOUNTER — Encounter: Payer: Self-pay | Admitting: Pain Medicine

## 2022-11-15 ENCOUNTER — Telehealth: Payer: Self-pay | Admitting: Surgical

## 2022-11-15 ENCOUNTER — Ambulatory Visit: Payer: 59 | Attending: Pain Medicine | Admitting: Pain Medicine

## 2022-11-15 ENCOUNTER — Other Ambulatory Visit: Payer: Self-pay

## 2022-11-15 VITALS — BP 106/74 | HR 81 | Temp 97.5°F | Resp 18 | Ht 60.0 in | Wt 126.0 lb

## 2022-11-15 DIAGNOSIS — M79601 Pain in right arm: Secondary | ICD-10-CM | POA: Insufficient documentation

## 2022-11-15 DIAGNOSIS — M47819 Spondylosis without myelopathy or radiculopathy, site unspecified: Secondary | ICD-10-CM

## 2022-11-15 DIAGNOSIS — G8929 Other chronic pain: Secondary | ICD-10-CM | POA: Diagnosis not present

## 2022-11-15 DIAGNOSIS — M5416 Radiculopathy, lumbar region: Secondary | ICD-10-CM | POA: Diagnosis not present

## 2022-11-15 DIAGNOSIS — M79661 Pain in right lower leg: Secondary | ICD-10-CM | POA: Insufficient documentation

## 2022-11-15 DIAGNOSIS — Z853 Personal history of malignant neoplasm of breast: Secondary | ICD-10-CM | POA: Insufficient documentation

## 2022-11-15 DIAGNOSIS — M47817 Spondylosis without myelopathy or radiculopathy, lumbosacral region: Secondary | ICD-10-CM | POA: Insufficient documentation

## 2022-11-15 DIAGNOSIS — G894 Chronic pain syndrome: Secondary | ICD-10-CM | POA: Diagnosis not present

## 2022-11-15 DIAGNOSIS — R937 Abnormal findings on diagnostic imaging of other parts of musculoskeletal system: Secondary | ICD-10-CM | POA: Insufficient documentation

## 2022-11-15 DIAGNOSIS — M79604 Pain in right leg: Secondary | ICD-10-CM | POA: Diagnosis not present

## 2022-11-15 DIAGNOSIS — M79672 Pain in left foot: Secondary | ICD-10-CM | POA: Diagnosis present

## 2022-11-15 DIAGNOSIS — M4156 Other secondary scoliosis, lumbar region: Secondary | ICD-10-CM

## 2022-11-15 DIAGNOSIS — M545 Low back pain, unspecified: Secondary | ICD-10-CM | POA: Diagnosis not present

## 2022-11-15 DIAGNOSIS — Z79899 Other long term (current) drug therapy: Secondary | ICD-10-CM | POA: Diagnosis not present

## 2022-11-15 DIAGNOSIS — M79662 Pain in left lower leg: Secondary | ICD-10-CM | POA: Diagnosis not present

## 2022-11-15 DIAGNOSIS — M2540 Effusion, unspecified joint: Secondary | ICD-10-CM | POA: Insufficient documentation

## 2022-11-15 DIAGNOSIS — M79671 Pain in right foot: Secondary | ICD-10-CM | POA: Insufficient documentation

## 2022-11-15 DIAGNOSIS — Z79891 Long term (current) use of opiate analgesic: Secondary | ICD-10-CM | POA: Diagnosis present

## 2022-11-15 DIAGNOSIS — M79605 Pain in left leg: Secondary | ICD-10-CM | POA: Insufficient documentation

## 2022-11-15 DIAGNOSIS — M79602 Pain in left arm: Secondary | ICD-10-CM | POA: Diagnosis present

## 2022-11-15 DIAGNOSIS — M4807 Spinal stenosis, lumbosacral region: Secondary | ICD-10-CM

## 2022-11-15 DIAGNOSIS — R52 Pain, unspecified: Secondary | ICD-10-CM | POA: Diagnosis not present

## 2022-11-15 MED ORDER — OXYCODONE HCL 5 MG PO TABS
5.0000 mg | ORAL_TABLET | Freq: Two times a day (BID) | ORAL | 0 refills | Status: DC
  Filled 2022-11-15 – 2022-11-17 (×3): qty 60, 30d supply, fill #0

## 2022-11-15 NOTE — Telephone Encounter (Signed)
Received STD forms, called patient to discuss, she did not answer. Left voicemail.  Will complete forms once patient returns call to discuss.

## 2022-11-15 NOTE — Progress Notes (Signed)
Nursing Pain Medication Assessment:  Safety precautions to be maintained throughout the outpatient stay will include: orient to surroundings, keep bed in low position, maintain call bell within reach at all times, provide assistance with transfer out of bed and ambulation.  Medication Inspection Compliance: Pill count conducted under aseptic conditions, in front of the patient. Neither the pills nor the bottle was removed from the patient's sight at any time. Once count was completed pills were immediately returned to the patient in their original bottle.  Medication: Oxycodone IR Pill/Patch Count:  14 of 60 pills remain Pill/Patch Appearance: Markings consistent with prescribed medication Bottle Appearance: Standard pharmacy container. Clearly labeled. Filled Date: 21 / 27 / 2024 Last Medication intake:  Today

## 2022-11-15 NOTE — Patient Outreach (Signed)
  Care Coordination   Collaboration phone call-  Visit Note   11/15/2022 Name: Tricia Berry MRN: [id number] DOB: 07/06/1971  Tricia Berry is a 52 y.o. year old female who sees Arnett, Yvetta Coder, FNP for primary care. I spoke with  Social Worker Development worker, community by phone today.  What matters to the patients health and wellness today?  Financial Resources    Goals Addressed             This Visit's Progress    "I need more information on insurance coverage "       Care Coordination Interventions: Collaboration phone call the the [place] worker to discuss options related to employment and resources This social worker will follow up within 1 week         SDOH assessments and interventions completed:  No     Care Coordination Interventions:  Yes, provided   Follow up plan: Follow up call scheduled for 1/41/24    Encounter Outcome:  Pt. Visit Completed

## 2022-11-15 NOTE — Patient Instructions (Signed)
____________________________________________________________________________________________  Opioid Pain Medication Update  To: All patients taking opioid pain medications. (I.e.: hydrocodone, hydromorphone, oxycodone, oxymorphone, morphine, codeine, methadone, tapentadol, tramadol, buprenorphine, fentanyl, etc.)  Re: Review of side effects and adverse reactions of opioid analgesics, as well as new information about long term effects of this class of medications.  Direct risks of long-term opioid therapy are not limited to opioid addiction and overdose. Potential medical risks include serious fractures, breathing problems during sleep, hyperalgesia, immunosuppression, chronic constipation, bowel obstruction, myocardial infarction, and tooth decay secondary to xerostomia.  Historically, the original case for using long-term opioid therapy to treat chronic noncancer pain was based on safety assumptions that subsequent experience has called into question. In 1996, the American Pain Society and the [place] [employer] issued a consensus statement supporting long-term opioid therapy. This statement acknowledged the dangers of opioid prescribing but concluded that the risk for addiction was low; respiratory depression induced by opioids was short-lived, occurred mainly in opioid-naive patients, and was antagonized by pain; tolerance was not a common problem; and efforts to control diversion should not constrain opioid prescribing. This has now proven to be wrong. Experience regarding the risks for opioid addiction, misuse, and overdose in community practice has failed to support these assumptions.  According to the Centers for Disease Control and Prevention, fatal overdoses involving opioid analgesics have increased sharply over the past decade. Currently, more than 96,700 people die from drug overdoses every year. Opioids are a factor in 7 out of every 10 overdose deaths. Deaths from drug  overdose have surpassed motor vehicle accidents as the leading cause of death for individuals between the ages of 79 and 72.  Clinical data suggest that neuroendocrine dysfunction may be very common in both men and women, potentially causing hypogonadism, erectile dysfunction, infertility, decreased libido, osteoporosis, and depression. Recent studies linked higher opioid dose to increased opioid-related mortality. Controlled observational studies reported that long-term opioid therapy may be associated with increased risk for cardiovascular events. Subsequent meta-analysis concluded that the safety of long-term opioid therapy in elderly patients has not been proven.   Side Effects and adverse reactions: Common side effects: Drowsiness (sedation). Dizziness. Nausea and vomiting. Constipation. Physical dependence -- Dependence often manifests with withdrawal symptoms when opioids are discontinued or decreased. Tolerance -- As you take repeated doses of opioids, you require increased medication to experience the same effect of pain relief. Respiratory depression -- This can occur in healthy people, especially with higher doses. However, people with COPD, asthma or other lung conditions may be even more susceptible to fatal respiratory impairment.  Uncommon side effects: An increased sensitivity to feeling pain and extreme response to pain (hyperalgesia). Chronic use of opioids can lead to this. Delayed gastric emptying (the process by which the contents of your stomach are moved into your small intestine). Muscle rigidity. Immune system and hormonal dysfunction. Quick, involuntary muscle jerks (myoclonus). Arrhythmia. Itchy skin (pruritus). Dry mouth (xerostomia).  Long-term side effects: Chronic constipation. Sleep-disordered breathing (SDB). Increased risk of bone fractures. Hypothalamic-pituitary-adrenal dysregulation. Increased risk of overdose.  RISKS: Fractures and Falls:   Opioids increase the risk and incidence of falls. This is of particular importance in elderly patients.  Endocrine System:  Long-term administration is associated with endocrine abnormalities. Influences on both the hypothalamic-pituitary-adrenal axis?and the hypothalamic-pituitary-gonadal axis have been demonstrated with consequent hypogonadism and adrenal insufficiency in both sexes. Hypogonadism and decreased levels of dehydroepiandrosterone sulfate have been reported in men and women. Endocrine effects can lead to: Amenorrhoea in women Reduced  libido in both sexes Erectile dysfunction in men Infertility Depression and fatigue Patients (particularly women of childbearing age) should avoid opioids. There is insufficient evidence to recommend routine monitoring of asymptomatic patients taking opioids in the long-term for hormonal deficiencies.  Immune System: Human studies have demonstrated that opioids have an immunomodulating effect. These effects are mediated via opioid receptors both on immune effector cells and in the central nervous system. Opioids have been demonstrated to have adverse effects on antimicrobial response and anti-tumour surveillance. Buprenorphine has been demonstrated to have no impact on immune function.  Opioid Induced Hyperalgesia: Human studies have demonstrated that prolonged use of opioids can lead to a state of abnormal pain sensitivity, sometimes called opioid induced hyperalgesia (OIH). Opioid induced hyperalgesia is not usually seen in the absence of tolerance to opioid analgesia. Clinically, hyperalgesia may be diagnosed if the patient on long-term opioid therapy presents with increased pain. This might be qualitatively and anatomically distinct from pain related to disease progression or to breakthrough pain resulting from development of opioid tolerance. Pain associated with hyperalgesia tends to be more diffuse than the pre-existing pain and less defined  in quality. Management of opioid induced hyperalgesia requires opioid dose reduction.  Cancer: Chronic opioid therapy has been associated with an increased risk of cancer among noncancer patients with chronic pain. This association was more evident in chronic strong opioid users. Chronic opioid consumption causes significant pathological changes in the small intestine and colon. Epidemiological studies have found that there is a link between opium dependence and initiation of gastrointestinal cancers. Cancer is the second leading cause of death after cardiovascular disease. Chronic use of opioids can cause multiple conditions such as GERD, immunosuppression and renal damage as well as carcinogenic effects, which are associated with the incidence of cancers.   Mortality: Long-term opioid use has been associated with increased mortality among patients with chronic non-cancer pain (CNCP).  Prescription of long-acting opioids for chronic noncancer pain was associated with a significantly increased risk of all-cause mortality, including deaths from causes other than overdose.  Reference: Von Korff M, Kolodny A, Deyo RA, Chou R. Long-term opioid therapy reconsidered. Ann Intern Med. 2011 Sep 6;155(5):325-8. doi: 10.7326/[id number]. PMID: 78469629; PMCID: [id number]. Morley Kos, Hayward RA, Dunn KM, [place] KP. Risk of adverse events in patients prescribed long-term opioids: A cohort study in the [place] Clinical Practice Research Datalink. Eur J Pain. 2019 May;23(5):908-922. doi: 10.1002/ejp.1357. Epub 2019 Jan 31. PMID: 10272536. Colameco S, Coren JS, Ciervo CA. Continuous opioid treatment for chronic noncancer pain: a time for moderation in prescribing. Postgrad Med. 2009 Jul;121(4):61-6. doi: 10.3810/pgm.2009.07.2032. PMID: 64403474. Heywood Bene RN, Pelican SD, Blazina I, Rosalio Loud, Bougatsos C, Deyo RA. The effectiveness and risks of long-term  opioid therapy for chronic pain: a systematic review for a [employer] of Health Pathways to [employer]. Ann Intern Med. 2015 Feb 17;162(4):276-86. doi: 25.9563/O75-6433. PMID: 29518841. Marjory Sneddon [facility], Makuc DM. NCHS Data Brief No. 22. [place]: Centers for Disease Control and Prevention; 2009. Sep, Increase in Fatal Poisonings Involving Opioid Analgesics in the [place], 1999-2006. Song IA, Choi HR, Oh TK. Long-term opioid use and mortality in patients with chronic non-cancer pain: Ten-year follow-up study in [place] from 2010 through 2019. EClinicalMedicine. 2022 Jul 18;51:101558. doi: 10.1016/j.eclinm.2022.660630. PMID: 16010932; PMCID: [id number]. Huser, W., Schubert, T., Vogelmann, T. et al. All-cause mortality in patients with long-term opioid therapy compared with non-opioid analgesics for chronic non-cancer pain: a database study. [place] Med 18,  162 (2020). [email or web address] Rashidian H, Roxy Cedar, Malekzadeh R, Haghdoost AA. An Ecological Study of the Association between Opiate Use and Incidence of Cancers. Addict Health. 2016 Fall;8(4):252-260. PMID: 51884166; PMCID: [id number].  Our Goals and Recommendations: Our goal is to control your pain with means other than the use of opioid pain medications. Talk to your physician about coming off of these medications. We can assist you with the tapering down and stopping these medicines. Based on the information above, even if you cannot completely stop these medicines, even a decrease in the dose has been shown to be associated with a decreased risk.  ____________________________________________________________________________________________     ____________________________________________________________________________________________  National Pain Medication Shortage  The [place] is experiencing worsening drug shortages. These have had a negative widespread effect on patient care  and treatment. Not expected to improve any time soon. Predicted to last past 2029.   Drug shortage list (generic names) Oxycodone IR Oxycodone/APAP Oxymorphone IR Hydromorphone Hydrocodone/APAP Morphine  Where is the problem?  Manufacturing and supply level.  Will this shortage affect you?  Only if you take any of the above pain medications.  How? You may be unable to fill your prescription.  Your pharmacist may offer a "partial fill" of your prescription. (Warning: Do not accept partial fills.) Prescriptions partially filled cannot be transferred to another pharmacy. Read our Medication Rules and Regulation. Depending on how much medicine you are dependent on, you may experience withdrawals when unable to get the medication.  Recommendations: Consider ending your dependence on opioid pain medications. Ask your pain specialist to assist you with the process. Consider switching to a medication currently not in shortage, such as Buprenorphine. Talk to your pain specialist about this option. Consider decreasing your pain medication requirements by managing tolerance thru "Drug Holidays". This may help minimize withdrawals, should you run out of medicine. Control your pain thru the use of non-pharmacological interventional therapies.   Your prescriber: Prescribers cannot be blamed for shortages. Medication manufacturing and supply issues cannot be fixed by the prescriber.   NOTE: The prescriber is not responsible for supplying the medication, or solving supply issues. Work with your pharmacist to solve it. The patient is responsible for the decision to take or continue taking the medication and for identifying and securing a legal supply source. By law, supplying the medication is the job and responsibility of the pharmacy. The prescriber is responsible for the evaluation, monitoring, and prescribing of these medications.   Prescribers will NOT: Re-issue prescriptions that have been  partially filled. Re-issue prescriptions already sent to a pharmacy.  Re-send prescriptions to a different pharmacy because yours did not have your medication. Ask pharmacist to order more medicine or transfer the prescription to another pharmacy. (Read below.)  New 2023 regulation: "June 23, 2022 Revised Regulation Allows DEA-Registered Pharmacies to Transfer Electronic Prescriptions at a Patient's Request [place] Patients now have the ability to request their electronic prescription be transferred to another pharmacy without having to go back to their practitioner to initiate the request. This revised regulation went into effect on Monday, June 19, 2022.     At a patient's request, a DEA-registered retail pharmacy can now transfer an electronic prescription for a controlled substance (schedules II-V) to another DEA-registered retail pharmacy. Prior to this change, patients would have to go through their practitioner to cancel their prescription and have it re-issued to a different pharmacy. The process was taxing and time consuming for both patients and practitioners.  The Drug Enforcement Administration [facility]) published its intent to revise the process for transferring electronic prescriptions on September 10, 2020.  The final rule was published in the federal register on May 18, 2022 and went into effect 30 days later.  Under the final rule, a prescription can only be transferred once between pharmacies, and only if allowed under existing state or other applicable law. The prescription must remain in its electronic form; may not be altered in any way; and the transfer must be communicated directly between two licensed pharmacists. It's important to note, any authorized refills transfer with the original prescription, which means the entire prescription will be filled at the same pharmacy".   Reference: [email or web address] [facility] website announcement)  [email or web address] ([place])   [employer] / Vol. 88, No. 143 / Thursday, May 18, 2022 / Rules and Regulations DEPARTMENT OF JUSTICE  Drug Enforcement Administration  21 CFR Part 1306  [Docket No. [id number]]  RIN [id number] Transfer of Electronic Prescriptions for Schedules II-V Controlled Substances Between Pharmacies for Initial Filling  ____________________________________________________________________________________________     _______________________________________________________________________  Medication Rules  Purpose: To inform patients, and their family members, of our medication rules and regulations.  Applies to: All patients receiving prescriptions from our practice (written or electronic).  Pharmacy of record: This is the pharmacy where your electronic prescriptions will be sent. Make sure we have the correct one.  Electronic prescriptions: In compliance with the [place] (STOP) Act of 2017 (Session Lanny Cramp [phone]), effective October 23, 2018, all controlled substances must be electronically prescribed. Written prescriptions, faxing, or calling prescriptions to a pharmacy will no longer be done.  Prescription refills: These will be provided only during in-person appointments. No medications will be renewed without a "face-to-face" evaluation with your provider. Applies to all prescriptions.  NOTE: The following applies primarily to controlled substances (Opioid* Pain Medications).   Type of encounter (visit): For patients receiving controlled substances, face-to-face visits are required. (Not an option and not up to the patient.)  Patient's responsibilities: Pain Pills: Bring all  pain pills to every appointment (except for procedure appointments). Pill Bottles: Bring pills in original pharmacy bottle. Bring bottle, even if empty. Always bring the bottle of the most recent fill.  Medication refills: You are responsible for knowing and keeping track of what medications you are taking and when is it that you will need a refill. The day before your appointment: write a list of all prescriptions that need to be refilled. The day of the appointment: give the list to the admitting nurse. Prescriptions will be written only during appointments. No prescriptions will be written on procedure days. If you forget a medication: it will not be "Called in", "Faxed", or "electronically sent". You will need to get another appointment to get these prescribed. No early refills. Do not call asking to have your prescription filled early. Partial  or short prescriptions: Occasionally your pharmacy may not have enough pills to fill your prescription.  NEVER ACCEPT a partial fill or a prescription that is short of the total amount of pills that you were prescribed.  With controlled substances the law allows 72 hours for the pharmacy to complete the prescription.  If the prescription is not completed within 72 hours, the pharmacist will require a new prescription to be written. This means that you will be short on your medicine and we WILL NOT send another prescription to complete your original prescription.  Instead, request the pharmacy  to send a carrier to a nearby branch to get enough medication to provide you with your full prescription. Prescription Accuracy: You are responsible for carefully inspecting your prescriptions before leaving our office. Have the discharge nurse carefully go over each prescription with you, before taking them home. Make sure that your name is accurately spelled, that your address is correct. Check the name and dose of your medication to make sure it is accurate. Check the  number of pills, and the written instructions to make sure they are clear and accurate. Make sure that you are given enough medication to last until your next medication refill appointment. Taking Medication: Take medication as prescribed. When it comes to controlled substances, taking less pills or less frequently than prescribed is permitted and encouraged. Never take more pills than instructed. Never take the medication more frequently than prescribed.  Inform other Doctors: Always inform, all of your healthcare providers, of all the medications you take. Pain Medication from other Providers: You are not allowed to accept any additional pain medication from any other Doctor or Healthcare provider. There are two exceptions to this rule. (see below) In the event that you require additional pain medication, you are responsible for notifying us, as stated below. Cough Medicine: Often these contain an opioid, such as codeine or hydrocodone. Never accept or take cough medicine containing these opioids if you are already taking an opioid* medication. The combination may cause respiratory failure and death. Medication Agreement: You are responsible for carefully reading and following our Medication Agreement. This must be signed before receiving any prescriptions from our practice. Safely store a copy of your signed Agreement. Violations to the Agreement will result in no further prescriptions. (Additional copies of our Medication Agreement are available upon request.) Laws, Rules, & Regulations: All patients are expected to follow all Federal and [employer], [employer], Rules, [employer]. Ignorance of the Laws does not constitute a valid excuse.  Illegal drugs and Controlled Substances: The use of illegal substances (including, but not limited to marijuana and its derivatives) and/or the illegal use of any controlled substances is strictly prohibited. Violation of this rule may result in the immediate and  permanent discontinuation of any and all prescriptions being written by our practice. The use of any illegal substances is prohibited. Adopted CDC guidelines & recommendations: Target dosing levels will be at or below 60 MME/day. Use of benzodiazepines** is not recommended.  Exceptions: There are only two exceptions to the rule of not receiving pain medications from other Healthcare Providers. Exception #1 (Emergencies): In the event of an emergency (i.e.: accident requiring emergency care), you are allowed to receive additional pain medication. However, you are responsible for: As soon as you are able, call our office [phone], at any time of the day or night, and leave a message stating your name, the date and nature of the emergency, and the name and dose of the medication prescribed. In the event that your call is answered by a member of our staff, make sure to document and save the date, time, and the name of the person that took your information.  Exception #2 (Planned Surgery): In the event that you are scheduled by another doctor or dentist to have any type of surgery or procedure, you are allowed (for a period no longer than 30 days), to receive additional pain medication, for the acute post-op pain. However, in this case, you are responsible for picking up a copy of our "Post-op Pain Management for Surgeons"  handout, and giving it to your surgeon or dentist. This document is available at our office, and does not require an appointment to obtain it. Simply go to our office during business hours (Monday-Thursday from 8:00 AM to 4:00 PM) (Friday 8:00 AM to 12:00 Noon) or if you have a scheduled appointment with [place], prior to your surgery, and ask for it by name. In addition, you are responsible for: calling our office [phone], at any time of the day or night, and leaving a message stating your name, name of your surgeon, type of surgery, and date of procedure or surgery. Failure to comply  with your responsibilities may result in termination of therapy involving the controlled substances. Medication Agreement Violation. Following the above rules, including your responsibilities will help you in avoiding a Medication Agreement Violation ("Breaking your Pain Medication Contract").  Consequences:  Not following the above rules may result in permanent discontinuation of medication prescription therapy.  *Opioid medications include: morphine, codeine, oxycodone, oxymorphone, hydrocodone, hydromorphone, meperidine, tramadol, tapentadol, buprenorphine, fentanyl, methadone. **Benzodiazepine medications include: diazepam (Valium), alprazolam (Xanax), clonazepam (Klonopine), lorazepam (Ativan), clorazepate (Tranxene), chlordiazepoxide (Librium), estazolam (Prosom), oxazepam (Serax), temazepam (Restoril), triazolam (Halcion) (Last updated: 08/15/2022) ______________________________________________________________________    ______________________________________________________________________  Medication Recommendations and Reminders  Applies to: All patients receiving prescriptions (written and/or electronic).  Medication Rules & Regulations: You are responsible for reading, knowing, and following our "Medication Rules" document. These exist for your safety and that of others. They are not flexible and neither are we. Dismissing or ignoring them is an act of "non-compliance" that may result in complete and irreversible termination of such medication therapy. For safety reasons, "non-compliance" will not be tolerated. As with the [place] fundamental legal principle of "ignorance of the law is no defense", we will accept no excuses for not having read and knowing the content of documents provided to you by our practice.  Pharmacy of record:  Definition: This is the pharmacy where your electronic prescriptions will be sent.  We do not endorse any particular pharmacy. It is up to you and your  insurance to decide what pharmacy to use.  We do not restrict you in your choice of pharmacy. However, once we write for your prescriptions, we will NOT be re-sending more prescriptions to fix restricted supply problems created by your pharmacy, or your insurance.  The pharmacy listed in the electronic medical record should be the one where you want electronic prescriptions to be sent. If you choose to change pharmacy, simply notify our nursing staff. Changes will be made only during your regular appointments and not over the phone.  Recommendations: Keep all of your pain medications in a safe place, under lock and key, even if you live alone. We will NOT replace lost, stolen, or damaged medication. We do not accept "Police Reports" as proof of medications having been stolen. After you fill your prescription, take 1 week's worth of pills and put them away in a safe place. You should keep a separate, properly labeled bottle for this purpose. The remainder should be kept in the original bottle. Use this as your primary supply, until it runs out. Once it's gone, then you know that you have 1 week's worth of medicine, and it is time to come in for a prescription refill. If you do this correctly, it is unlikely that you will ever run out of medicine. To make sure that the above recommendation works, it is very important that you make sure your medication refill appointments are  scheduled at least 1 week before you run out of medicine. To do this in an effective manner, make sure that you do not leave the office without scheduling your next medication management appointment. Always ask the nursing staff to show you in your prescription , when your medication will be running out. Then arrange for the receptionist to get you a return appointment, at least 7 days before you run out of medicine. Do not wait until you have 1 or 2 pills left, to come in. This is very poor planning and does not take into consideration  that we may need to cancel appointments due to bad weather, sickness, or emergencies affecting our staff. DO NOT ACCEPT A "Partial Fill": If for any reason your pharmacy does not have enough pills/tablets to completely fill or refill your prescription, do not allow for a "partial fill". The law allows the pharmacy to complete that prescription within 72 hours, without requiring a new prescription. If they do not fill the rest of your prescription within those 72 hours, you will need a separate prescription to fill the remaining amount, which we will NOT provide. If the reason for the partial fill is your insurance, you will need to talk to the pharmacist about payment alternatives for the remaining tablets, but again, DO NOT ACCEPT A PARTIAL FILL, unless you can trust your pharmacist to obtain the remainder of the pills within 72 hours.  Prescription refills and/or changes in medication(s):  Prescription refills, and/or changes in dose or medication, will be conducted only during scheduled medication management appointments. (Applies to both, written and electronic prescriptions.) No refills on procedure days. No medication will be changed or started on procedure days. No changes, adjustments, and/or refills will be conducted on a procedure day. Doing so will interfere with the diagnostic portion of the procedure. No phone refills. No medications will be "called into the pharmacy". No Fax refills. No weekend refills. No Holliday refills. No after hours refills.  Remember:  Business hours are:  Monday to Thursday 8:00 AM to 4:00 PM Provider's Schedule: Milinda Pointer, MD - Appointments are:  Medication management: Monday and Wednesday 8:00 AM to 4:00 PM Procedure day: Tuesday and Thursday 7:30 AM to 4:00 PM Gillis Santa, MD - Appointments are:  Medication management: Tuesday and Thursday 8:00 AM to 4:00 PM Procedure day: Monday and Wednesday 7:30 AM to 4:00 PM (Last update:  08/15/2022) ______________________________________________________________________    ____________________________________________________________________________________________  Drug Holidays  What is a "Drug Holiday"? Drug Holiday: is the name given to the process of slowly tapering down and temporarily stopping the pain medication for the purpose of decreasing or eliminating tolerance to the drug.  Benefits Improved effectiveness Decreased required effective dose Improved pain control End dependence on high dose therapy Decrease cost of therapy Uncovering "opioid-induced hyperalgesia". (OIH)  What is "opioid hyperalgesia"? It is a paradoxical increase in pain caused by exposure to opioids. Stopping the opioid pain medication, contrary to the expected, it actually decreases or completely eliminates the pain. Ref.: "A comprehensive review of opioid-induced hyperalgesia". Brion Aliment, et.al. Pain Physician. 2011 Mar-Apr;14(2):145-61.  What is tolerance? Tolerance: the progressive loss of effectiveness of a pain medicine due to repetitive use. A common problem of opioid pain medications.  How long should a "Drug Holiday" last? Effectiveness depends on the patient staying off all opioid pain medicines for a minimum of 14 consecutive days. (2 weeks)  How about just taking less of the medicine? Does not work. Will not accomplish goal of  eliminating the excess receptors.  How about switching to a different pain medicine? (AKA. "Opioid rotation") Does not work. Creates the illusion of effectiveness by taking advantage of inaccurate equivalent dose calculations between different opioids. -This "technique" was promoted by studies funded by [employer], such as [employer], creators of "OxyContin".  Can I stop the medicine "cold [place]"? Depends. You should always coordinate with your Pain Specialist to make the transition as smoothly as possible. Avoid stopping the  medicine abruptly without consulting. We recommend a "slow taper".  What is a slow taper? Taper: refers to the gradual decrease in dose.   How do I stop/taper the dose? Slowly. Decrease the daily amount of pills that you take by one (1) pill every seven (7) days. This is called a "slow downward taper". Example: if you normally take four (4) pills per day, drop it to three (3) pills per day for seven (7) days, then to two (2) pills per day for seven (7) days, then to one (1) per day for seven (7) days, and then stop the medicine. The 14 day "Drug Holiday" starts on the first day without medicine.   Will I experience withdrawals? Unlikely with a slow taper.  What triggers withdrawals? Withdrawals are triggered by the sudden/abrupt stop of high dose opioids. Withdrawals can be avoided by slowly decreasing the dose over a prolonged period of time.  What are withdrawals? Symptoms associated with sudden/abrupt reduction/stopping of high-dose, long-term use of pain medication. Withdrawal are seldom seen on low dose therapy, or patients rarely taking opioid medication.  Early Withdrawal Symptoms may include: Agitation Anxiety Muscle aches Increased tearing Insomnia Runny nose Sweating Yawning  Late symptoms may include: Abdominal cramping Diarrhea Dilated pupils Goose bumps Nausea Vomiting  (Last update: 10/01/2022) ____________________________________________________________________________________________    ____________________________________________________________________________________________  WARNING: CBD (cannabidiol) & Delta (Delta-8 tetrahydrocannabinol) products.   Applicable to:  All individuals currently taking or considering taking CBD (cannabidiol) and, more important, all patients taking opioid analgesic controlled substances (pain medication). (Example: oxycodone; oxymorphone; hydrocodone; hydromorphone; morphine; methadone; tramadol; tapentadol; fentanyl;  buprenorphine; butorphanol; dextromethorphan; meperidine; codeine; etc.)  Introduction:  Recently there has been a drive towards the use of "natural" products for the treatment of different conditions, including pain anxiety and sleep disorders. Marijuana and hemp are two varieties of the cannabis genus plants. Marijuana and its derivatives are illegal, while hemp and its derivatives are not. Cannabidiol (CBD) and tetrahydrocannabinol (THC), are two natural compounds found in plants of the Cannabis genus. They can both be extracted from hemp or marijuana. Both compounds interact with your body's endocannabinoid system in very different ways. CBD is associated with pain relief (analgesia) while THC is associated with the psychoactive effects ("the high") obtained from the use of marijuana products. There are two main types of THC: Delta-9, which comes from the marijuana plant and it is illegal, and Delta-8, which comes from the hemp plant, and it is legal. (Both, Delta-9-THC and Delta-8-THC are psychoactive and give you "the high".)   Legality:  Marijuana and its derivatives: illegal Hemp and its derivatives: Legal (State dependent) UPDATE: (12/09/2021) The Drug Enforcement Agency ([place]) issued a letter stating that "delta" cannabinoids, including Delta-8-THCO and Delta-9-THCO, synthetically derived from hemp do not qualify as hemp and will be viewed as Schedule I drugs. (Schedule I drugs, substances, or chemicals are defined as drugs with no currently accepted medical use and a high potential for abuse. Some examples of Schedule I drugs are: heroin, lysergic acid diethylamide (LSD), marijuana (cannabis),  3,4-methylenedioxymethamphetamine (ecstasy), methaqualone, and peyote.) (https://jennings.com/)  Legal status of CBD in [place]:  "Conditionally Legal"  Reference: "FDA Regulation of Cannabis and Cannabis-Derived Products, Including Cannabidiol (CBD)" -  SeekArtists.com.pt  Warning:  CBD is not FDA approved and has not undergo the same manufacturing controls as prescription drugs.  This means that the purity and safety of available CBD may be questionable. Most of the time, despite manufacturer's claims, it is contaminated with THC (delta-9-tetrahydrocannabinol - the chemical in marijuana responsible for the "HIGH").  When this is the case, the [facility] contaminant will trigger a positive urine drug screen (UDS) test for Marijuana (carboxy-THC).   The FDA recently put out a warning about 5 things that everyone should be aware of regarding Delta-8 THC: Delta-8 THC products have not been evaluated or approved by the FDA for safe use and may be marketed in ways that put the public health at risk. The FDA has received adverse event reports involving delta-8 THC-containing products. Delta-8 THC has psychoactive and intoxicating effects. Delta-8 THC manufacturing often involve use of potentially harmful chemicals to create the concentrations of delta-8 THC claimed in the marketplace. The final delta-8 THC product may have potentially harmful by-products (contaminants) due to the chemicals used in the process. Manufacturing of delta-8 THC products may occur in uncontrolled or unsanitary settings, which may lead to the presence of unsafe contaminants or other potentially harmful substances. Delta-8 THC products should be kept out of the reach of children and pets.  NOTE: Because a positive UDS for any illicit substance is a violation of our medication agreement, your opioid analgesics (pain medicine) may be permanently discontinued.  MORE ABOUT CBD  General Information: CBD was discovered in [birth year] and it is a derivative of the cannabis sativa genus plants (Marijuana and Hemp). It is one of the 113 identified substances found in Marijuana. It accounts  for up to 40% of the plant's extract. As of 2018, preliminary clinical studies on CBD included research for the treatment of anxiety, movement disorders, and pain. CBD is available and consumed in multiple forms, including inhalation of smoke or vapor, as an aerosol spray, and by mouth. It may be supplied as an oil containing CBD, capsules, dried cannabis, or as a liquid solution. CBD is thought not to be as psychoactive as THC (delta-9-tetrahydrocannabinol - the chemical in marijuana responsible for the "HIGH"). Studies suggest that CBD may interact with different biological target receptors in the body, including cannabinoid and other neurotransmitter receptors. As of 2018 the mechanism of action for its biological effects has not been determined.  Side-effects  Adverse reactions: Dry mouth, diarrhea, decreased appetite, fatigue, drowsiness, malaise, weakness, sleep disturbances, and others.  Drug interactions:  CBD may interact with medications such as blood-thinners. CBD causes drowsiness on its own and it will increase drowsiness caused by other medications, including antihistamines (such as Benadryl), benzodiazepines (Xanax, Ativan, Valium), antipsychotics, antidepressants, opioids, alcohol and supplements such as kava, melatonin and St. John's Wort.  Other drug interactions: Brivaracetam (Briviact); Caffeine; Carbamazepine (Tegretol); Citalopram (Celexa); Clobazam (Onfi); Eslicarbazepine (Aptiom); Everolimus (Zostress); Lithium; Methadone (Dolophine); Rufinamide (Banzel); Sedative medications (CNS depressants); Sirolimus (Rapamune); Stiripentol (Diacomit); Tacrolimus (Prograf); Tamoxifen ; Soltamox); Topiramate (Topamax); Valproate; Warfarin (Coumadin); Zonisamide. (Last update: 10/02/2022) ____________________________________________________________________________________________   ____________________________________________________________________________________________  Naloxone Nasal  Spray  Why am I receiving this medication? [place] STOP ACT requires that all patients taking high dose opioids or at risk of opioids respiratory depression, be prescribed an opioid reversal agent, such as Naloxone (AKA: Narcan).  What  is this medication? NALOXONE (nal OX one) treats opioid overdose, which causes slow or shallow breathing, severe drowsiness, or trouble staying awake. Call emergency services after using this medication. You may need additional treatment. Naloxone works by reversing the effects of opioids. It belongs to a group of medications called opioid blockers.  COMMON BRAND NAME(S): Kloxxado, Narcan  What should I tell my care team before I take this medication? They need to know if you have any of these conditions: Heart disease Substance use disorder An unusual or allergic reaction to naloxone, other medications, foods, dyes, or preservatives Pregnant or trying to get pregnant Breast-feeding  When to use this medication? This medication is to be used for the treatment of respiratory depression (less than 8 breaths per minute) secondary to opioid overdose.   How to use this medication? This medication is for use in the nose. Lay the person on their back. Support their neck with your hand and allow the head to tilt back before giving the medication. The nasal spray should be given into 1 nostril. After giving the medication, move the person onto their side. Do not remove or test the nasal spray until ready to use. Get emergency medical help right away after giving the first dose of this medication, even if the person wakes up. You should be familiar with how to recognize the signs and symptoms of a narcotic overdose. If more doses are needed, give the additional dose in the other nostril. Talk to your care team about the use of this medication in children. While this medication may be prescribed for children as young as newborns for selected conditions, precautions  do apply.  Naloxone Overdosage: If you think you have taken too much of this medicine contact a poison control center or emergency room at once.  NOTE: This medicine is only for you. Do not share this medicine with others.  What if I miss a dose? This does not apply.  What may interact with this medication? This is only used during an emergency. No interactions are expected during emergency use. This list may not describe all possible interactions. Give your health care provider a list of all the medicines, herbs, non-prescription drugs, or dietary supplements you use. Also tell them if you smoke, drink alcohol, or use illegal drugs. Some items may interact with your medicine.  What should I watch for while using this medication? Keep this medication ready for use in the case of an opioid overdose. Make sure that you have the phone number of your care team and local hospital ready. You may need to have additional doses of this medication. Each nasal spray contains a single dose. Some emergencies may require additional doses. After use, bring the treated person to the nearest hospital or call 911. Make sure the treating care team knows that the person has received a dose of this medication. You will receive additional instructions on what to do during and after use of this medication before an emergency occurs.  What side effects may I notice from receiving this medication? Side effects that you should report to your care team as soon as possible: Allergic reactions--skin rash, itching, hives, swelling of the face, lips, tongue, or throat Side effects that usually do not require medical attention (report these to your care team if they continue or are bothersome): Constipation Dryness or irritation inside the nose Headache Increase in blood pressure Muscle spasms Stuffy nose Toothache This list may not describe all possible side effects. Call  your doctor for medical advice about side  effects. You may report side effects to FDA at 1-800-FDA-1088.  Where should I keep my medication? Because this is an emergency medication, you should keep it with you at all times.  Keep out of the reach of children and pets. Store between 20 and 25 degrees C (68 and 77 degrees F). Do not freeze. Throw away any unused medication after the expiration date. Keep in original box until ready to use.  NOTE: This sheet is a summary. It may not cover all possible information. If you have questions about this medicine, talk to your doctor, pharmacist, or health care provider.   2023 Elsevier/Gold Standard (2021-06-17 00:00:00)  ____________________________________________________________________________________________   ____________________________________________________________________________________________  Patient Information update  To: All of our patients.  Re: Name change.  It has been made official that our current name, "[facility]"   will soon be changed to "[facility]".   The purpose of this change is to eliminate any confusion created by the concept of our practice being a "Medication Management Pain Clinic". In the past this has led to the misconception that we treat pain primarily by the use of prescription medications.  Nothing can be farther from the truth.   Understanding PAIN MANAGEMENT: To further understand what our practice does, you first have to understand that "Pain Management" is a subspecialty that requires additional training once a physician has completed their specialty training, which can be in either Anesthesia, Neurology, Psychiatry, or Physical Medicine and Rehabilitation (PMR). Each one of these contributes to the final approach taken by each physician to the management of their patient's pain. To be a "Pain Management Specialist" you must have first  completed one of the specialty trainings below.  Anesthesiologists - trained in clinical pharmacology and interventional techniques such as nerve blockade and regional as well as central neuroanatomy. They are trained to block pain before, during, and after surgical interventions.  Neurologists - trained in the diagnosis and pharmacological treatment of complex neurological conditions, such as Multiple Sclerosis, Parkinson's, spinal cord injuries, and other systemic conditions that may be associated with symptoms that may include but are not limited to pain. They tend to rely primarily on the treatment of chronic pain using prescription medications.  Psychiatrist - trained in conditions affecting the psychosocial wellbeing of patients including but not limited to depression, anxiety, schizophrenia, personality disorders, addiction, and other substance use disorders that may be associated with chronic pain. They tend to rely primarily on the treatment of chronic pain using prescription medications.   Physical Medicine and Rehabilitation (PMR) physicians, also known as physiatrists - trained to treat a wide variety of medical conditions affecting the brain, spinal cord, nerves, bones, joints, ligaments, muscles, and tendons. Their training is primarily aimed at treating patients that have suffered injuries that have caused severe physical impairment. Their training is primarily aimed at the physical therapy and rehabilitation of those patients. They may also work alongside orthopedic surgeons or neurosurgeons using their expertise in assisting surgical patients to recover after their surgeries.  INTERVENTIONAL PAIN MANAGEMENT is sub-subspecialty of Pain Management.  Our physicians are Board-certified in Anesthesia, Pain Management, and Interventional Pain Management.  This meaning that not only have they been trained and Board-certified in their specialty of Anesthesia, and subspecialty of Pain Management,  but they have also received further training in the sub-subspecialty of Interventional Pain Management, in order to become Board-certified as  INTERVENTIONAL PAIN MANAGEMENT SPECIALIST.    Mission: Our goal is to use our skills in  [place] as alternatives to the chronic use of prescription opioid medications for the treatment of pain. To make this more clear, we have changed our name to reflect what we do and offer. We will continue to offer medication management assessment and recommendations, but we will not be taking over any patient's medication management.  ____________________________________________________________________________________________

## 2022-11-16 ENCOUNTER — Ambulatory Visit: Payer: 59 | Admitting: Occupational Therapy

## 2022-11-16 ENCOUNTER — Telehealth: Payer: Self-pay | Admitting: *Deleted

## 2022-11-16 ENCOUNTER — Encounter: Payer: Self-pay | Admitting: Occupational Therapy

## 2022-11-16 DIAGNOSIS — I972 Postmastectomy lymphedema syndrome: Secondary | ICD-10-CM

## 2022-11-16 NOTE — Telephone Encounter (Addendum)
Received  Disability Claim form from [employer]. Form completed and sent for physician signature

## 2022-11-16 NOTE — Therapy (Signed)
OUTPATIENT OCCUPATIONAL THERAPY TREATMENT NOTE POST MASTECTOMY LUE/LUQ UPPER EXTREMITY LYMPHEDEMA  Patient Name: Tricia Berry MRN: [id number] DOB:[birth year], 52 y.o., female Today's Date: 11/21/2022    OT End of Session - 11/21/22 1601     Visit Number 6    Number of Visits 36    Date for OT Re-Evaluation 11/20/22    OT Start Time 0200    OT Stop Time 0300    OT Time Calculation (min) 60 min    Activity Tolerance Patient tolerated treatment well;No increased pain    Behavior During Therapy [facility] for tasks assessed/performed              Past Medical History:  Diagnosis Date   Anxiety    Breast cancer ([place]) 12/2021   left breast IMC   Diverticulitis    Family history of ovarian cancer    GERD (gastroesophageal reflux disease)    IBS (irritable bowel syndrome)    Pneumonia    PONV (postoperative nausea and vomiting)    severe migraine and vomiting post anesthesia   Scoliosis    Past Surgical History:  Procedure Laterality Date   ABDOMINAL HYSTERECTOMY     still has ovaries, no gyn cancer, hysterectomy due to endometriosis. NO cervix on exam 01/10/21   BREAST BIOPSY Left 09/15/2020   [place] bx of mass, path pending, Q marker   BREAST BIOPSY Left 09/15/2020   [place] bx of LN, hydromarker, path pending   BREAST RECONSTRUCTION WITH PLACEMENT OF TISSUE EXPANDER AND FLEX HD (ACELLULAR HYDRATED DERMIS) Bilateral 03/17/2021   Procedure: IMMEDIATE BILATERAL BREAST RECONSTRUCTION WITH PLACEMENT OF TISSUE EXPANDER AND FLEX HD (ACELLULAR HYDRATED DERMIS);  Surgeon: Wallace Going, DO;  Location: [place];  Service: Plastics;  Laterality: Bilateral;   CAPSULECTOMY Right 10/18/2022   Procedure: Right breast capsule release with adjustment;  Surgeon: Wallace Going, DO;  Location: [place];  Service: Plastics;  Laterality: Right;   COLONOSCOPY WITH PROPOFOL N/A 11/21/2021   Procedure: COLONOSCOPY WITH PROPOFOL;  Surgeon: Lin Landsman,  MD;  Location: [facility] ENDOSCOPY;  Service: Gastroenterology;  Laterality: N/A;   IR IMAGING GUIDED PORT INSERTION  10/01/2020   MODIFIED MASTECTOMY Left 03/17/2021   Procedure: LEFT MODIFIED RADICAL MASTECTOMY;  Surgeon: Erroll Luna, MD;  Location: [place];  Service: General;  Laterality: Left;   PORTA CATH REMOVAL Right 03/17/2021   Procedure: PORTA CATH REMOVAL;  Surgeon: Erroll Luna, MD;  Location: [place];  Service: General;  Laterality: Right;   REMOVAL OF BILATERAL TISSUE EXPANDERS WITH PLACEMENT OF BILATERAL BREAST IMPLANTS Bilateral 05/09/2021   Procedure: REMOVAL OF BILATERAL TISSUE EXPANDERS WITH PLACEMENT OF BILATERAL BREAST IMPLANTS;  Surgeon: Wallace Going, DO;  Location: [place];  Service: Plastics;  Laterality: Bilateral;  90 min   TOTAL MASTECTOMY Right 03/17/2021   Procedure: TOTAL MASTECTOMY;  Surgeon: Erroll Luna, MD;  Location: [place];  Service: General;  Laterality: Right;   Patient Active Problem List   Diagnosis Date Noted   Hypoxia 11/21/2022   Chronic sacroiliac joint pain (Bilateral) 10/03/2022    Class: Chronic   Chronic hip pain (Left) 10/03/2022    Class: Chronic   CAP (community acquired pneumonia) 08/29/2022   SOB (shortness of breath) 08/29/2022   Breast asymmetry following reconstructive surgery 08/15/2022   Lumbosacral facet arthropathy (Multilevel) (Bilateral) 07/05/2022   Lumbar spinal facet joint arthropathy with concurrent effusion (L3-4) (Right) 07/05/2022   Degenerative lumbar rotatory levoscoliosis  07/05/2022   Lumbosacral foraminal stenosis (Bilateral: L3-4) (Left: L4-5, L5-S1) 07/05/2022   Lumbosacral lateral recess stenosis (Left: L4-5) (Bilateral: L5-S1) 07/05/2022   Lumbar nerve root impingement (Bilateral: L3-4, L5-S1) (Left: L4-5) 07/05/2022   Long term prescription benzodiazepine use 07/05/2022   Chronic use of opiate for therapeutic purpose 07/05/2022    Lumbosacral facet syndrome (Bilateral) 07/05/2022   Pain medication agreement signed (12/07/21) 05/29/2022   History of breast cancer (Left) 05/29/2022   History of mastectomy (Bilateral) 05/29/2022   Chronic calf pain (2ry area of Pain) (Bilateral) 05/29/2022   Chronic lower extremity pain (3ry area of Pain) (Bilateral) 05/29/2022   Chronic feet pain (Bilateral) 05/29/2022   Chronic upper extremity pain (4th area of Pain) (Bilateral) (L>R) 05/29/2022   Chronic hand pain (Bilateral) (L>R) 05/29/2022   Chronic generalized pain (5th area of Pain) 05/29/2022   Abnormal MRI, lumbar spine (10/13/2021) 05/29/2022   Chronic pain syndrome 05/28/2022   Pharmacologic therapy 05/28/2022   Disorder of skeletal system 05/28/2022   Problems influencing health status 05/28/2022   Family history of heart disease 11/16/2021   Acquired absence of breast 08/16/2021   B12 deficiency 07/21/2021   Atherosclerosis of aorta ([place]) 07/20/2021   Hepatic steatosis 07/20/2021   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[place]) 03/14/2021   Genetic testing 11/22/2020   Family history of ovarian cancer    Candidal vulvovaginitis 11/01/2020   Chronic low back pain (1ry area of Pain) (Bilateral) (R>L) w/o sciatica 10/06/2020   Encounter for medical examination to establish care 09/26/2020   Malignant neoplasm of upper-outer quadrant of left breast in female, estrogen receptor positive ([place]) 09/26/2020   Sprain of lumbar region 03/08/2010   INTERNAL HEMORRHOIDS 01/27/2010   Anal fissure 01/27/2010   Osteoarthrosis, hand 01/04/2010   Arthralgia 10/05/2009   INSOMNIA, CHRONIC 05/06/2009   ALLERGIC RHINITIS, SEASONAL 02/09/2009   TOBACCO ABUSE 12/29/2008   IBS 10/13/2008   Anxiety state 08/25/2008   DEPRESSION, RECURRENT 08/25/2008    PCP: Burnard Hawthorne, FPN  REFERRING PROVIDER: Verlon Au, NP  REFERRING DIAG: 197.2  THERAPY DIAG:  LUE/ LUQ Post-mastectomy lymphedema syndrome  ONSET DATE:  03/17/21  SUBJECTIVE                                                                                                                                                                                           SUBJECTIVE STATEMENT:Tricia Berry presents for OT to address L breast cancer related lymphedema. Reports she continues to have pulling sensation in her axilla extending volarly down her arm towards elbow. Pt does not rate pain numerically.   PERTINENT HISTORY: L Breast Ca ER+, PR+, HER2-,  stage III  L breast biopsy 09/15/20; Neoadjuvant chemotherapy; L modified radical mastectomy and R prophylactic total mastectomy with immediate placement of tissue expanders 03/17/21; L ALND 7+/17. XRT completed 02/18/21. Marland Kitchen  Upcoming R capsulectomy scheduled 09/07/22. Chronic pain syndrome, chronic insomnia, OA B hands, Tobacco abuse, IBS, Anxiety, Recurrent Depression   PAIN:  Are you having pain? Yes. Unchanged since 10/06/22 NPRS scale: not rated numerically Pain location: L lateral trunk, axilla, volar arm and forearm, chest wall Pain orientation L PAIN TYPE: pulling, discomfort, fullness, tightness Pain description: intermittent Aggravating factors: end range shoulder AROM, lifting, carrying, stretching Relieving factors: MLD, compression  PRECAUTIONS: Falls,  Lymphedema Precautions  HAND DOMINANCE : right   PRIOR LEVEL OF FUNCTION: Independent  PATIENT GOALS: make sure lymphedema doesn't get worse; reduce pain and discomfort in my hands and arm and trunk   OBJECTIVE   LYMPHEDEMA ASSESSMENTS:   FOTO FUNCTIONAL OUTCOME SCORE: Intake 59/100%  LYMPHEDEMA LIFE IMPACT SCALE (LLIS) Intake TBA 1st Rx visit. Pt did not complete backside of page  TODAY'S TREATMENT:  Pt edu  PATIENT EDUCATION:  Education details: Pt edu for there ex    for axillary web syndrome- stretches to lengthen cording Person educated: Patient Education method: Consulting civil engineer, Demonstration, and Handouts Education  comprehension: verbalized understanding, returned demonstration, and needs further education  LYMPHEDEMA SELF-CARE HOME PROGRAM: Observe lymphedema precautions and prevention principals to limit progression Simple Self-Manual lymphatic drainage (MLD LUE/LUQ Lymphatic pumping there ex and soft tissue stretching there ex Daily and PRN skin care Compression arm sleeve prophylactic ly and PRN for 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 Ensure pad covers serratus anterior and L lateral trunk as well as chest wall. Pt is of petite stature so custom pas and sleeve may be required to achieve correct fit RECOMMENDED COMPRESSION: Custom Juzo, ccl 1 (20-30 mmHg) compression arm sleeve, x 2 Custom ccl 1 Juzo glove, x 2 JoviPak lumpectomy pad Belisse compression bras , x 2 Unable to fit with off the shelf compression arm sleeve as Pt is too petite to fit into manufacturer's size range for smallest size 1  ASSESSMENT:  CLINICAL IMPRESSION: Provided Pt edu for there ex    stretching to lengthen and or break axillary cording. Several cords are palpable, with the largest one too large to break manually. Pt encouraged to perform all there ex elements 5 reps each, in any order she likes, at least 2 x daily holding each stretch repetition a minute or more (length of TV commercial). Pt returned demonstration after skilled training. Remainder of session dedicated to manual therapy  , myofacial release and MLD, in effort to relieve axillary web. Cont as per OC. Fit garments, sleeve. Bra a,d Jovi pads ASAP.  OBJECTIVE IMPAIRMENTS: decreased activity tolerance, decreased balance, decreased endurance, decreased knowledge of condition, decreased knowledge of use of DME, impaired LUE shoulder AROM, decreased strength, impaired flexibility, impaired sensation, impaired UE functional use, postural dysfunction, impaired dynamic balance,  muscle weakness, pain, and increased lymphedema progression risk.   ACTIVITY LIMITATIONS: carrying, lifting, sleeping, bed mobility, reach over head, hygiene/grooming, caring for others, working as Marine scientist, driving, meal prep, cooking  PARTICIPATION LIMITATIONS: cleaning, laundry, driving, shopping, occupation, and yard work  PERSONAL FACTORS: 3+ comorbidities: chronic pain, lumbar stenosis, chemo-induced neuropathy in all 4 limbs  are also affecting patient's functional outcome.   REHAB POTENTIAL: Good for limiting LE progression   GOALS: Goals reviewed with patient? Yes  SHORT TERM GOALS: Target date: 4th OT Rx visit    Pt will demonstrate understanding of lymphedema precautions and prevention strategies with modified independence using a printed reference to identify at least 5 precautions and discussing how s/he may implement them into daily life to reduce risk of progression and to limit infection risk. Baseline:Max A Goal status: 11/09/22 NOT MET   LONG TERM GOALS: Target date: 02/13/2023    Given this patient's Intake score of 59/100% on the functional outcomes FOTO tool, patient will experience an increase in function of 3 points  to improve basic and instrumental ADLs performance, including lymphedema self-care. Baseline: 59/100% Goal status:  11/09/22 NOT MET  2.  Given this patient's Intake score of TBA/100% on the Lymphedema Life Impact Scale (LLIS), patient will experience an increase of 5 points in her perceived level of functional impairment resulting from lymphedema to improve functional performance and quality of life (QOL). Baseline: TBA Goal status:  11/09/22 NOT MET  3.  With assistive devices and modified independence (extra time) Pt will be able to don and doff appropriate compression garments and/or devices to control BLE lymphedema and to limit progression.  Baseline: Max A Goal status:  11/09/22 NOT MET  4.  Pt will be able to correctly perform all lymphedema  self-care home program components using correct techniques with extra time and assistive devices PRN (modified independence), including simple self MLD, lymphatic pumping exercise, don and doff appropriate compression garments/ devices, and perform daily skin care regime to limit progression. Baseline: Max A Goal status:  11/09/22 NOT MET  PLAN:  PT FREQUENCY: 1-2x/week  PT DURATION: 12 weeks  PLANNED INTERVENTIONS: Therapeutic exercises, Therapeutic activity, Patient/Family education, Self Care, DME instructions, Manual lymph drainage, Taping, Manual therapy, and compression garment measurement , fitting and training  PLAN FOR NEXT SESSION:  Complete compression glove measurements Further assess AWS Have Pt complete LLIS Complete initial volumetrics using percentages and limb volume differential vs circumferences.  Andrey Spearman, MS, OTR/L, CLT-LANA 11/21/22 4:03 PM

## 2022-11-16 NOTE — Telephone Encounter (Signed)
[facility] faxed request for: Diagnostic test results Medications and side effects Office notes with physical findings Operative or procedure al notes Outline of treatment plan Physical therapy/Occupational  For time period of 10/1/202 through 11/15/2022 from Neuro-Oncologist.    Submitted 08/04/2022 office note and 07/31/2022 MRI brain W WO Contrast which meet ROI criteria.  No further action performed by this nurse.

## 2022-11-17 ENCOUNTER — Other Ambulatory Visit: Payer: Self-pay

## 2022-11-17 ENCOUNTER — Ambulatory Visit (INDEPENDENT_AMBULATORY_CARE_PROVIDER_SITE_OTHER): Payer: 59 | Admitting: Student in an Organized Health Care Education/Training Program

## 2022-11-17 ENCOUNTER — Encounter: Payer: Self-pay | Admitting: Student in an Organized Health Care Education/Training Program

## 2022-11-17 VITALS — BP 120/72 | HR 76 | Temp 98.1°F | Ht 60.0 in | Wt 125.0 lb

## 2022-11-17 DIAGNOSIS — R0602 Shortness of breath: Secondary | ICD-10-CM | POA: Diagnosis not present

## 2022-11-17 NOTE — Progress Notes (Signed)
Synopsis: Referred in for shortness of breath by Burnard Hawthorne, FNP  Assessment & Plan:   1. Shortness of breath  She is presenting for the evaluation of shortness of breath in the setting of known breast cancer s/p chemotherapy, resection, and immune therapy. She was recently on Abemaciclib stopped in November of 2023. She did undergo a CT scan of the chest in November that on my review is notable for bilateral patchy ground glass opacities. The patient has decided to continue to hold Abemaciclib after having a recent discussion with her oncologist.  The differential for this, given the patients symptoms and history, includes drug induced pneumonitis secondary to Abemaciclib, hypersensitivity pneumonitis (given mold, symptoms not consistent with hypersensitivity but remains possible), and atypical infection. She has stopped the Abemaciclib and her symptoms (and oxygenation) has improved sans steroids. Finally, she could have underlying mild COPD that was unmasked by this acute event.  Given concern for drug induced pneumonitis, I will obtain a repeat CT scan of the chest to assess for any residual ground glass opacities that could signal active inflammation. If that is present, I will consider bronchoscopy with BAL to rule out infection (with possible transbronchial biopsies) prior to initiation of steroids. I will also obtain a pulmonary function test to assess for any obstructive lung disease.   - CT CHEST WO CONTRAST; Future - Pulmonary Function Test [facility] Only; Future   Return in about 2 weeks (around 12/01/2022).  I spent 60 minutes caring for this patient today, including preparing to see the patient, obtaining a medical history , reviewing a separately obtained history, performing a medically appropriate examination and/or evaluation, counseling and educating the patient/family/caregiver, ordering medications, tests, or procedures, documenting clinical information in the electronic  health record, and independently interpreting results (not separately reported/billed) and communicating results to the patient/family/caregiver  Armando Reichert, MD [facility] 11/17/2022 10:46 AM   End of visit medications:  No orders of the defined types were placed in this encounter.    Current Outpatient Medications:    albuterol (VENTOLIN HFA) 108 (90 Base) MCG/ACT inhaler, Inhale 2 puffs into the lungs every 6 (six) hours as needed for wheezing or shortness of breath., Disp: 6.7 g, Rfl: 0   anastrozole (ARIMIDEX) 1 MG tablet, Take 1 tablet (1 mg total) by mouth daily., Disp: 30 tablet, Rfl: 5   baclofen (LIORESAL) 10 MG tablet, Take 1 tablet (10 mg total) by mouth 3 (three) times daily as needed for muscle spasms., Disp: 15 tablet, Rfl: 1   Budeson-Glycopyrrol-Formoterol (BREZTRI AEROSPHERE) 160-9-4.8 MCG/ACT AERO, Inhale 2 puffs into the lungs 2 (two) times daily., Disp: 10.7 g, Rfl: 11   cyanocobalamin (VITAMIN B12) 1000 MCG/ML injection, 1000 mcg (1 mL) intramuscular injection in the thigh ( vastus lateralis) once per month., Disp: 3 mL, Rfl: 4   FLUoxetine (PROZAC) 20 MG capsule, Take 1 capsule (20 mg total) by mouth every morning., Disp: 90 capsule, Rfl: 3   Lactobacillus (PROBIOTIC ACIDOPHILUS PO), Take 1 capsule by mouth daily., Disp: , Rfl:    LORazepam (ATIVAN) 0.5 MG tablet, Take 1 tablet (0.5 mg total) by mouth at bedtime., Disp: 30 tablet, Rfl: 2   Multiple Vitamins-Minerals (MULTIVITAL PO), Take 1 Dose by mouth daily., Disp: , Rfl:    naloxone (NARCAN) nasal spray 4 mg/0.1 mL, Place 1 spray into the nose as needed for opioid-induced respiratory depresssion. In case of emergency (overdose), spray once into each nostril. If no response within 3 minutes, repeat application  and call 911., Disp: 2 each, Rfl: 0   nicotine (NICODERM CQ - DOSED IN MG/24 HOURS) 21 mg/24hr patch, use 1 patch daily, Disp: 28 patch, Rfl: 0   oxyCODONE (OXY IR/ROXICODONE) 5 MG  immediate release tablet, Take 1 tablet (5 mg total) by mouth 2 (two) times daily. Must last 30 days., Disp: 60 tablet, Rfl: 0   pregabalin (LYRICA) 150 MG capsule, Take 1 capsule (150 mg total) by mouth in the morning, at noon, and at bedtime., Disp: 90 capsule, Rfl: 2   rosuvastatin (CRESTOR) 5 MG tablet, Take 5 mg by mouth daily., Disp: , Rfl:    trimethoprim (TRIMPEX) 100 MG tablet, Take 1 tablet (100 mg total) by mouth daily., Disp: 90 tablet, Rfl: 3 No current facility-administered medications for this visit.  Facility-Administered Medications Ordered in Other Visits:    acetaminophen (TYLENOL) 325 MG tablet, , , ,    diphenhydrAMINE (BENADRYL) 25 mg capsule, , , ,    heparin lock flush 100 unit/mL, 500 Units, Intravenous, Once, Sindy Guadeloupe, MD   Zoledronic Acid (ZOMETA) 4 MG/100ML IVPB, , , ,    Subjective:   PATIENT ID: [name] GENDER: female DOB: 10/02/1971, MRN: [id number]  Chief Complaint  Patient presents with   pulmonary consult    PNA 08/2022-SOB with exertion and occ wheezing at night.      HPI  The patient is a pleasant 52 year old female with a past medical history of breast cancer presenting to clinic for the evaluation of shortness of breath.  The patient reports that her symptoms were insidious and she noticed them in November. At that time, she was undergoing an evaluation for breast reconstruction and was found to be severely hypoxic with SpO2 of 87%.  She was seen by her primary care physician where a chest x-ray showed findings concerning for pneumonia followed by a CT scan showing the same.  Her symptoms mostly revolve around dyspnea which at that time was occurring at rest and with minimal exertion.  She also reports a cough that is nonproductive.  The symptoms started in the summer around the time that Abemaciclib was initiated.  She first noticed a cough that happened throughout the summer and culminated in the sudden onset of shortness of breath.   The patient does note some chest pains and tightness that she attributes to her surgeries and reconstructions.  Following this, she was initiated on a course of antibiotics and abemaciclib was halted.  Her symptoms have slowly resolved but she continues to report exertional dyspnea with some mild wheezing that occurs at night.  She still has a cough that is mild and nonproductive. She was started on Breztri by her primary care physician which she feels was tremendously helpful. She is not using her rescue inhalers at the moment.  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  She has received a course of Pertuzumab and Trastuzumab following her resection. She had been on Abemaciclib which was stopped in November of 2023 and has not been initiated since.  Patient used to work as an Warden/ranger. She is a smoker, and has smoked on and off since her teenage years. She reports having mold in their bathroom that was professionally remediated.  Ancillary information including prior medications, full medical/surgical/family/social histories, and PFTs (when available) are listed below and have been reviewed.   Review of Systems  Constitutional:  Negative for chills, fever, malaise/fatigue and weight loss.  Respiratory:  Positive for cough, shortness of breath and wheezing. Negative for hemoptysis and sputum production.   Cardiovascular:  Negative for chest pain, leg swelling and PND.  Skin:  Negative for rash.     Objective:   Vitals:   11/17/22 0931  BP: 120/72  Pulse: 76  Temp: 98.1 F (36.7 C)  TempSrc: Temporal  SpO2: 97%  Weight: 125 lb (56.7 kg)  Height: 5' (1.524 m)   97% on RA  BMI Readings from Last 3 Encounters:  11/17/22 24.41 kg/m  11/15/22 24.61 kg/m  11/13/22 24.63 kg/m   Wt Readings from Last 3 Encounters:  11/17/22 125 lb (56.7  kg)  11/15/22 126 lb (57.2 kg)  11/13/22 126 lb 2 oz (57.2 kg)    Physical Exam Constitutional:      General: She is not in acute distress.    Appearance: Normal appearance. She is not ill-appearing.  HENT:     Head: Normocephalic.     Mouth/Throat:     Mouth: Mucous membranes are moist.  Cardiovascular:     Rate and Rhythm: Normal rate and regular rhythm.     Pulses: Normal pulses.     Heart sounds: Normal heart sounds.  Pulmonary:     Effort: Pulmonary effort is normal.     Breath sounds: Rales (scant rales on anterior auscultation of the left upper lung field) present.  Abdominal:     General: Abdomen is flat.     Palpations: Abdomen is soft.  Musculoskeletal:        General: Normal range of motion.     Right lower leg: No edema.     Left lower leg: No edema.  Neurological:     General: No focal deficit present.     Mental Status: She is alert and oriented to person, place, and time. Mental status is at baseline.     Ancillary Information    Past Medical History:  Diagnosis Date   Anxiety    Breast cancer ([place]) 12/2021   left breast IMC   Diverticulitis    Family history of ovarian cancer    GERD (gastroesophageal reflux disease)    IBS (irritable bowel syndrome)    Pneumonia    PONV (postoperative nausea and vomiting)    severe migraine and vomiting post anesthesia   Scoliosis      Family History  Problem Relation Age of Onset   Hypertension Mother    Osteoarthritis Mother    Diverticulitis Mother    Heart failure Father    Hypertension Father    Gout Father    Heart attack Father 47   Diverticulitis Brother    Ovarian cancer Paternal Grandmother      Past Surgical History:  Procedure Laterality Date   ABDOMINAL HYSTERECTOMY     still has ovaries, no gyn cancer, hysterectomy due to endometriosis. NO cervix on exam 01/10/21   BREAST BIOPSY Left 09/15/2020   [place] bx of mass, path pending, Q marker   BREAST BIOPSY Left 09/15/2020   [place] bx of LN,  hydromarker, path pending   BREAST RECONSTRUCTION WITH PLACEMENT OF TISSUE EXPANDER AND FLEX HD (ACELLULAR HYDRATED DERMIS) Bilateral 03/17/2021   Procedure: IMMEDIATE BILATERAL BREAST RECONSTRUCTION WITH PLACEMENT OF TISSUE EXPANDER AND FLEX HD (ACELLULAR HYDRATED DERMIS);  Surgeon: Wallace Going, DO;  Location: [place];  Service: Plastics;  Laterality: Bilateral;   CAPSULECTOMY Right 10/18/2022   Procedure: Right  breast capsule release with adjustment;  Surgeon: Wallace Going, DO;  Location: [place];  Service: Plastics;  Laterality: Right;   COLONOSCOPY WITH PROPOFOL N/A 11/21/2021   Procedure: COLONOSCOPY WITH PROPOFOL;  Surgeon: Lin Landsman, MD;  Location[facility] ENDOSCOPY;  Service: Gastroenterology;  Laterality: N/A;   IR IMAGING GUIDED PORT INSERTION  10/01/2020   MODIFIED MASTECTOMY Left 03/17/2021   Procedure: LEFT MODIFIED RADICAL MASTECTOMY;  Surgeon: Erroll Luna, MD;  Location: [place];  Service: General;  Laterality: Left;   PORTA CATH REMOVAL Right 03/17/2021   Procedure: PORTA CATH REMOVAL;  Surgeon: Erroll Luna, MD;  Location: [place];  Service: General;  Laterality: Right;   REMOVAL OF BILATERAL TISSUE EXPANDERS WITH PLACEMENT OF BILATERAL BREAST IMPLANTS Bilateral 05/09/2021   Procedure: REMOVAL OF BILATERAL TISSUE EXPANDERS WITH PLACEMENT OF BILATERAL BREAST IMPLANTS;  Surgeon: Wallace Going, DO;  Location: [place];  Service: Plastics;  Laterality: Bilateral;  90 min   TOTAL MASTECTOMY Right 03/17/2021   Procedure: TOTAL MASTECTOMY;  Surgeon: Erroll Luna, MD;  Location: [place];  Service: General;  Laterality: Right;    Social History   Socioeconomic History   Marital status: Divorced    Spouse name: Not on file   Number of children: 2   Years of education: Not on file   Highest education level: Not on file  Occupational History    Occupation: Nurse    Employer: [employer]  Tobacco Use   Smoking status: Every Day    Packs/day: 0.50    Years: 20.00    Total pack years: 10.00    Types: Cigarettes   Smokeless tobacco: Never  Vaping Use   Vaping Use: Never used  Substance and Sexual Activity   Alcohol use: Not Currently   Drug use: No   Sexual activity: Not Currently    Birth control/protection: Surgical    Comment: hyst  Other Topics Concern   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 She and her spouse are primary caregivers.    Social Determinants of Health   Financial Resource Strain: Not on file  Food Insecurity: No Food Insecurity (11/08/2022)   Hunger Vital Sign    Worried About Running Out of Food in the Last Year: Never true    Ran Out of Food in the Last Year: Never true  Transportation Needs: No Transportation Needs (11/08/2022)   PRAPARE - Hydrologist (Medical): No    Lack of Transportation (Non-Medical): No  Physical Activity: Not on file  Stress: Not on file  Social Connections: Not on file  Intimate Partner Violence: Not on file     Allergies  Allergen Reactions   Morphine Nausea And Vomiting and Other (See Comments)    migranes Other reaction(s): Headache Migraine, vomiting   Sertraline Hcl     REACTION: Worsened symptoms of IBS   Sulfa Antibiotics Rash   Sulfamethoxazole Rash   Sulfonamide Derivatives Rash     CBC    Component Value Date/Time   WBC 7.8 11/10/2022 0913   RBC 4.66 11/10/2022 0913   HGB 14.2 11/10/2022 0913   HGB 14.3 08/23/2020 1033   HCT 44.4 11/10/2022 0913   HCT 42.9 08/23/2020 1033   PLT 217 11/10/2022 0913   PLT 222 08/23/2020 1033   MCV 95.3 11/10/2022  0913   MCV 91 08/23/2020 1033   MCH 30.5 11/10/2022 0913   MCHC 32.0 11/10/2022 0913   RDW 14.6 11/10/2022 0913   RDW 13.1 08/23/2020 1033   LYMPHSABS 3.5 11/10/2022 0913    LYMPHSABS 4.2 (H) 08/23/2020 1033   MONOABS 0.6 11/10/2022 0913   EOSABS 0.1 11/10/2022 0913   EOSABS 0.1 08/23/2020 1033   BASOSABS 0.1 11/10/2022 0913   BASOSABS 0.1 08/23/2020 1033    Pulmonary Functions Testing Results:     No data to display          Outpatient Medications Prior to Visit  Medication Sig Dispense Refill   albuterol (VENTOLIN HFA) 108 (90 Base) MCG/ACT inhaler Inhale 2 puffs into the lungs every 6 (six) hours as needed for wheezing or shortness of breath. 6.7 g 0   anastrozole (ARIMIDEX) 1 MG tablet Take 1 tablet (1 mg total) by mouth daily. 30 tablet 5   baclofen (LIORESAL) 10 MG tablet Take 1 tablet (10 mg total) by mouth 3 (three) times daily as needed for muscle spasms. 15 tablet 1   Budeson-Glycopyrrol-Formoterol (BREZTRI AEROSPHERE) 160-9-4.8 MCG/ACT AERO Inhale 2 puffs into the lungs 2 (two) times daily. 10.7 g 11   cyanocobalamin (VITAMIN B12) 1000 MCG/ML injection 1000 mcg (1 mL) intramuscular injection in the thigh ( vastus lateralis) once per month. 3 mL 4   FLUoxetine (PROZAC) 20 MG capsule Take 1 capsule (20 mg total) by mouth every morning. 90 capsule 3   Lactobacillus (PROBIOTIC ACIDOPHILUS PO) Take 1 capsule by mouth daily.     LORazepam (ATIVAN) 0.5 MG tablet Take 1 tablet (0.5 mg total) by mouth at bedtime. 30 tablet 2   Multiple Vitamins-Minerals (MULTIVITAL PO) Take 1 Dose by mouth daily.     naloxone (NARCAN) nasal spray 4 mg/0.1 mL Place 1 spray into the nose as needed for opioid-induced respiratory depresssion. In case of emergency (overdose), spray once into each nostril. If no response within 3 minutes, repeat application and call 250. 2 each 0   nicotine (NICODERM CQ - DOSED IN MG/24 HOURS) 21 mg/24hr patch use 1 patch daily 28 patch 0   oxyCODONE (OXY IR/ROXICODONE) 5 MG immediate release tablet Take 1 tablet (5 mg total) by mouth 2 (two) times daily. Must last 30 days. 60 tablet 0   pregabalin (LYRICA) 150 MG capsule Take 1 capsule (150 mg  total) by mouth in the morning, at noon, and at bedtime. 90 capsule 2   rosuvastatin (CRESTOR) 5 MG tablet Take 5 mg by mouth daily.     trimethoprim (TRIMPEX) 100 MG tablet Take 1 tablet (100 mg total) by mouth daily. 90 tablet 3   Facility-Administered Medications Prior to Visit  Medication Dose Route Frequency Provider Last Rate Last Admin   acetaminophen (TYLENOL) 325 MG tablet            diphenhydrAMINE (BENADRYL) 25 mg capsule            heparin lock flush 100 unit/mL  500 Units Intravenous Once Sindy Guadeloupe, MD       Zoledronic Acid (ZOMETA) 4 MG/100ML IVPB

## 2022-11-20 ENCOUNTER — Telehealth: Payer: Self-pay

## 2022-11-20 ENCOUNTER — Inpatient Hospital Stay: Payer: 59

## 2022-11-20 DIAGNOSIS — C50412 Malignant neoplasm of upper-outer quadrant of left female breast: Secondary | ICD-10-CM

## 2022-11-20 NOTE — Progress Notes (Signed)
Pt here for Zoladex injection. Scheduled too early, unable to give until 2/2. Pt rescheduled for 2/2. Pt aware of changed

## 2022-11-20 NOTE — Telephone Encounter (Signed)
[facility] is requesting medical records for patient from 07/23/2022 to 01/*24/2024 but patient has not been seen during these times. Faxed this information back to them at [phone]

## 2022-11-21 ENCOUNTER — Other Ambulatory Visit: Payer: Self-pay

## 2022-11-21 ENCOUNTER — Emergency Department: Payer: 59

## 2022-11-21 ENCOUNTER — Ambulatory Visit (INDEPENDENT_AMBULATORY_CARE_PROVIDER_SITE_OTHER): Payer: 59 | Admitting: Family

## 2022-11-21 ENCOUNTER — Ambulatory Visit: Payer: 59 | Admitting: Occupational Therapy

## 2022-11-21 ENCOUNTER — Inpatient Hospital Stay
Admission: EM | Admit: 2022-11-21 | Discharge: 2022-11-24 | DRG: 193 | Disposition: A | Discharge status: Home / Self Care | Payer: 59 | Attending: Internal Medicine | Admitting: Internal Medicine

## 2022-11-21 ENCOUNTER — Encounter: Payer: Self-pay | Admitting: Family

## 2022-11-21 ENCOUNTER — Encounter: Payer: Self-pay | Admitting: Internal Medicine

## 2022-11-21 VITALS — BP 138/80 | HR 95 | Temp 98.7°F | Ht 60.0 in | Wt 126.8 lb

## 2022-11-21 DIAGNOSIS — Z9221 Personal history of antineoplastic chemotherapy: Secondary | ICD-10-CM | POA: Diagnosis not present

## 2022-11-21 DIAGNOSIS — T380X5A Adverse effect of glucocorticoids and synthetic analogues, initial encounter: Secondary | ICD-10-CM | POA: Diagnosis present

## 2022-11-21 DIAGNOSIS — R0902 Hypoxemia: Secondary | ICD-10-CM | POA: Diagnosis not present

## 2022-11-21 DIAGNOSIS — Z888 Allergy status to other drugs, medicaments and biological substances status: Secondary | ICD-10-CM

## 2022-11-21 DIAGNOSIS — C50012 Malignant neoplasm of nipple and areola, left female breast: Secondary | ICD-10-CM | POA: Diagnosis not present

## 2022-11-21 DIAGNOSIS — Z79899 Other long term (current) drug therapy: Secondary | ICD-10-CM

## 2022-11-21 DIAGNOSIS — Z79811 Long term (current) use of aromatase inhibitors: Secondary | ICD-10-CM | POA: Diagnosis not present

## 2022-11-21 DIAGNOSIS — F419 Anxiety disorder, unspecified: Secondary | ICD-10-CM | POA: Diagnosis present

## 2022-11-21 DIAGNOSIS — R7301 Impaired fasting glucose: Secondary | ICD-10-CM | POA: Diagnosis present

## 2022-11-21 DIAGNOSIS — A419 Sepsis, unspecified organism: Secondary | ICD-10-CM | POA: Diagnosis not present

## 2022-11-21 DIAGNOSIS — B370 Candidal stomatitis: Secondary | ICD-10-CM | POA: Diagnosis present

## 2022-11-21 DIAGNOSIS — Z1152 Encounter for screening for COVID-19: Secondary | ICD-10-CM

## 2022-11-21 DIAGNOSIS — R519 Headache, unspecified: Secondary | ICD-10-CM | POA: Diagnosis not present

## 2022-11-21 DIAGNOSIS — R059 Cough, unspecified: Secondary | ICD-10-CM | POA: Diagnosis not present

## 2022-11-21 DIAGNOSIS — Z7951 Long term (current) use of inhaled steroids: Secondary | ICD-10-CM | POA: Diagnosis not present

## 2022-11-21 DIAGNOSIS — Z885 Allergy status to narcotic agent status: Secondary | ICD-10-CM | POA: Diagnosis not present

## 2022-11-21 DIAGNOSIS — Z8041 Family history of malignant neoplasm of ovary: Secondary | ICD-10-CM

## 2022-11-21 DIAGNOSIS — J984 Other disorders of lung: Secondary | ICD-10-CM | POA: Diagnosis present

## 2022-11-21 DIAGNOSIS — E785 Hyperlipidemia, unspecified: Secondary | ICD-10-CM | POA: Diagnosis not present

## 2022-11-21 DIAGNOSIS — J9601 Acute respiratory failure with hypoxia: Secondary | ICD-10-CM | POA: Diagnosis not present

## 2022-11-21 DIAGNOSIS — J440 Chronic obstructive pulmonary disease with acute lower respiratory infection: Secondary | ICD-10-CM | POA: Diagnosis present

## 2022-11-21 DIAGNOSIS — J189 Pneumonia, unspecified organism: Secondary | ICD-10-CM | POA: Diagnosis not present

## 2022-11-21 DIAGNOSIS — G8929 Other chronic pain: Secondary | ICD-10-CM | POA: Diagnosis present

## 2022-11-21 DIAGNOSIS — J9621 Acute and chronic respiratory failure with hypoxia: Secondary | ICD-10-CM | POA: Diagnosis present

## 2022-11-21 DIAGNOSIS — Z9013 Acquired absence of bilateral breasts and nipples: Secondary | ICD-10-CM | POA: Diagnosis not present

## 2022-11-21 DIAGNOSIS — F17210 Nicotine dependence, cigarettes, uncomplicated: Secondary | ICD-10-CM | POA: Diagnosis present

## 2022-11-21 DIAGNOSIS — Z8249 Family history of ischemic heart disease and other diseases of the circulatory system: Secondary | ICD-10-CM | POA: Diagnosis not present

## 2022-11-21 DIAGNOSIS — R0602 Shortness of breath: Secondary | ICD-10-CM | POA: Diagnosis not present

## 2022-11-21 DIAGNOSIS — C50912 Malignant neoplasm of unspecified site of left female breast: Secondary | ICD-10-CM | POA: Diagnosis present

## 2022-11-21 DIAGNOSIS — J9801 Acute bronchospasm: Secondary | ICD-10-CM | POA: Diagnosis present

## 2022-11-21 DIAGNOSIS — R051 Acute cough: Secondary | ICD-10-CM

## 2022-11-21 DIAGNOSIS — Z720 Tobacco use: Secondary | ICD-10-CM | POA: Diagnosis present

## 2022-11-21 DIAGNOSIS — Z853 Personal history of malignant neoplasm of breast: Secondary | ICD-10-CM

## 2022-11-21 DIAGNOSIS — Z882 Allergy status to sulfonamides status: Secondary | ICD-10-CM

## 2022-11-21 DIAGNOSIS — K219 Gastro-esophageal reflux disease without esophagitis: Secondary | ICD-10-CM | POA: Diagnosis present

## 2022-11-21 DIAGNOSIS — R6520 Severe sepsis without septic shock: Secondary | ICD-10-CM | POA: Diagnosis not present

## 2022-11-21 LAB — COMPREHENSIVE METABOLIC PANEL
ALT: 41 U/L (ref 0–44)
AST: 44 U/L — ABNORMAL HIGH (ref 15–41)
Albumin: 3.8 g/dL (ref 3.5–5.0)
Alkaline Phosphatase: 66 U/L (ref 38–126)
Anion gap: 6 (ref 5–15)
BUN: 7 mg/dL (ref 6–20)
CO2: 27 mmol/L (ref 22–32)
Calcium: 9 mg/dL (ref 8.9–10.3)
Chloride: 102 mmol/L (ref 98–111)
Creatinine, Ser: 0.92 mg/dL (ref 0.44–1.00)
GFR, Estimated: >60 mL/min (ref >60)
Glucose, Bld: 117 mg/dL — ABNORMAL HIGH (ref 70–99)
Potassium: 3.9 mmol/L (ref 3.5–5.1)
Sodium: 135 mmol/L (ref 135–145)
Total Bilirubin: 0.7 mg/dL (ref 0.3–1.2)
Total Protein: 7.2 g/dL (ref 6.5–8.1)

## 2022-11-21 LAB — CBC WITH DIFFERENTIAL/PLATELET
Abs Immature Granulocytes: 0.03 10*3/uL (ref 0.00–0.07)
Basophils Absolute: 0 10*3/uL (ref 0.0–0.1)
Basophils Relative: 0 %
Eosinophils Absolute: 0 10*3/uL (ref 0.0–0.5)
Eosinophils Relative: 0 %
HCT: 39.9 % (ref 36.0–46.0)
Hemoglobin: 12.7 g/dL (ref 12.0–15.0)
Immature Granulocytes: 0 %
Lymphocytes Relative: 19 %
Lymphs Abs: 2 10*3/uL (ref 0.7–4.0)
MCH: 30.6 pg (ref 26.0–34.0)
MCHC: 31.8 g/dL (ref 30.0–36.0)
MCV: 96.1 fL (ref 80.0–100.0)
Monocytes Absolute: 1 10*3/uL (ref 0.1–1.0)
Monocytes Relative: 10 %
Neutro Abs: 7.3 10*3/uL (ref 1.7–7.7)
Neutrophils Relative %: 71 %
Platelets: 165 10*3/uL (ref 150–400)
RBC: 4.15 MIL/uL (ref 3.87–5.11)
RDW: 15.3 % (ref 11.5–15.5)
WBC: 10.4 10*3/uL (ref 4.0–10.5)
nRBC: 0 % (ref 0.0–0.2)

## 2022-11-21 LAB — RESP PANEL BY RT-PCR (RSV, FLU A&B, COVID)  RVPGX2
Influenza A by PCR: NEGATIVE
Influenza B by PCR: NEGATIVE
Resp Syncytial Virus by PCR: NEGATIVE
SARS Coronavirus 2 by RT PCR: NEGATIVE

## 2022-11-21 LAB — CBC
HCT: 39.8 % (ref 36.0–46.0)
Hemoglobin: 12.7 g/dL (ref 12.0–15.0)
MCH: 30.5 pg (ref 26.0–34.0)
MCHC: 31.9 g/dL (ref 30.0–36.0)
MCV: 95.7 fL (ref 80.0–100.0)
Platelets: 159 10*3/uL (ref 150–400)
RBC: 4.16 MIL/uL (ref 3.87–5.11)
RDW: 15.3 % (ref 11.5–15.5)
WBC: 10.4 10*3/uL (ref 4.0–10.5)
nRBC: 0 % (ref 0.0–0.2)

## 2022-11-21 LAB — POCT INFLUENZA A/B
Influenza A, POC: NEGATIVE
Influenza B, POC: NEGATIVE

## 2022-11-21 LAB — SEDIMENTATION RATE: Sed Rate: 39 mm/hr — ABNORMAL HIGH (ref 0–30)

## 2022-11-21 LAB — LACTIC ACID, PLASMA
Lactic Acid, Venous: 2.3 mmol/L (ref 0.5–1.9)
Lactic Acid, Venous: 1 mmol/L (ref 0.5–1.9)

## 2022-11-21 LAB — C-REACTIVE PROTEIN: CRP: 10.2 mg/dL — ABNORMAL HIGH (ref <1.0)

## 2022-11-21 LAB — POC COVID19 BINAXNOW: SARS Coronavirus 2 Ag: NEGATIVE

## 2022-11-21 MED ORDER — SODIUM CHLORIDE 0.9 % IV SOLN
2.0000 g | INTRAVENOUS | Status: DC
  Administered 2022-11-21 – 2022-11-22 (×2): 2 g via INTRAVENOUS
  Filled 2022-11-21: qty 2
  Filled 2022-11-21: qty 20

## 2022-11-21 MED ORDER — IOHEXOL 350 MG/ML SOLN
75.0000 mL | Freq: Once | INTRAVENOUS | Status: AC | PRN
  Administered 2022-11-21: 75 mL via INTRAVENOUS

## 2022-11-21 MED ORDER — BUDESON-GLYCOPYRROL-FORMOTEROL 160-9-4.8 MCG/ACT IN AERO: 2.0000 | INHALATION_SPRAY | Freq: Two times a day (BID) | RESPIRATORY_TRACT | Status: DC

## 2022-11-21 MED ORDER — BISACODYL 5 MG PO TBEC
5.0000 mg | DELAYED_RELEASE_TABLET | Freq: Every day | ORAL | Status: DC | PRN
  Filled 2022-11-21: qty 1

## 2022-11-21 MED ORDER — ENOXAPARIN SODIUM 40 MG/0.4ML IJ SOSY
40.0000 mg | PREFILLED_SYRINGE | INTRAMUSCULAR | Status: DC
  Administered 2022-11-21 – 2022-11-23 (×3): 40 mg via SUBCUTANEOUS
  Filled 2022-11-21 (×3): qty 0.4

## 2022-11-21 MED ORDER — ONDANSETRON HCL 4 MG PO TABS: 4.0000 mg | ORAL_TABLET | Freq: Four times a day (QID) | ORAL | Status: DC | PRN

## 2022-11-21 MED ORDER — SODIUM CHLORIDE 0.9 % IV BOLUS
1000.0000 mL | Freq: Once | INTRAVENOUS | Status: AC
  Administered 2022-11-21: 1000 mL via INTRAVENOUS

## 2022-11-21 MED ORDER — NICOTINE 21 MG/24HR TD PT24
21.0000 mg | MEDICATED_PATCH | Freq: Every day | TRANSDERMAL | Status: DC
  Administered 2022-11-21 – 2022-11-24 (×4): 21 mg via TRANSDERMAL
  Filled 2022-11-21 (×4): qty 1

## 2022-11-21 MED ORDER — FLUOXETINE HCL 20 MG PO CAPS
20.0000 mg | ORAL_CAPSULE | Freq: Every morning | ORAL | Status: DC
  Administered 2022-11-22 – 2022-11-24 (×3): 20 mg via ORAL
  Filled 2022-11-21 (×3): qty 1

## 2022-11-21 MED ORDER — PREGABALIN 75 MG PO CAPS
150.0000 mg | ORAL_CAPSULE | Freq: Three times a day (TID) | ORAL | Status: DC
  Administered 2022-11-21 – 2022-11-24 (×8): 150 mg via ORAL
  Filled 2022-11-21 (×8): qty 2

## 2022-11-21 MED ORDER — ONDANSETRON HCL 4 MG/2ML IJ SOLN: 4.0000 mg | Freq: Four times a day (QID) | INTRAMUSCULAR | Status: DC | PRN

## 2022-11-21 MED ORDER — POLYETHYLENE GLYCOL 3350 17 G PO PACK: 17.0000 g | PACK | Freq: Every day | ORAL | Status: DC | PRN

## 2022-11-21 MED ORDER — SODIUM CHLORIDE 0.9 % IV SOLN
500.0000 mg | INTRAVENOUS | Status: DC
  Administered 2022-11-21 – 2022-11-22 (×2): 500 mg via INTRAVENOUS
  Filled 2022-11-21: qty 500
  Filled 2022-11-21: qty 5

## 2022-11-21 MED ORDER — IBUPROFEN 400 MG PO TABS: 400.0000 mg | ORAL_TABLET | Freq: Four times a day (QID) | ORAL | Status: DC | PRN

## 2022-11-21 MED ORDER — BACLOFEN 10 MG PO TABS
10.0000 mg | ORAL_TABLET | Freq: Three times a day (TID) | ORAL | Status: DC | PRN
  Administered 2022-11-22 – 2022-11-24 (×4): 10 mg via ORAL
  Filled 2022-11-21 (×5): qty 1

## 2022-11-21 MED ORDER — LACTATED RINGERS IV SOLN: INTRAVENOUS | Status: DC

## 2022-11-21 MED ORDER — ALBUTEROL SULFATE (2.5 MG/3ML) 0.083% IN NEBU: 2.5000 mg | INHALATION_SOLUTION | RESPIRATORY_TRACT | Status: DC | PRN

## 2022-11-21 MED ORDER — ROSUVASTATIN CALCIUM 10 MG PO TABS
5.0000 mg | ORAL_TABLET | Freq: Every day | ORAL | Status: DC
  Administered 2022-11-21 – 2022-11-23 (×3): 5 mg via ORAL
  Filled 2022-11-21 (×4): qty 1

## 2022-11-21 MED ORDER — FLUTICASONE FUROATE-VILANTEROL 100-25 MCG/ACT IN AEPB
1.0000 | INHALATION_SPRAY | Freq: Every day | RESPIRATORY_TRACT | Status: DC
  Administered 2022-11-22 – 2022-11-24 (×3): 1 via RESPIRATORY_TRACT
  Filled 2022-11-21: qty 28

## 2022-11-21 MED ORDER — OXYCODONE HCL 5 MG PO TABS
5.0000 mg | ORAL_TABLET | Freq: Four times a day (QID) | ORAL | Status: DC | PRN
  Administered 2022-11-22 – 2022-11-24 (×6): 5 mg via ORAL
  Filled 2022-11-21 (×6): qty 1

## 2022-11-21 MED ORDER — LORAZEPAM 0.5 MG PO TABS
0.5000 mg | ORAL_TABLET | Freq: Every day | ORAL | Status: DC
  Administered 2022-11-21 – 2022-11-23 (×3): 0.5 mg via ORAL
  Filled 2022-11-21 (×3): qty 1

## 2022-11-21 MED ORDER — UMECLIDINIUM BROMIDE 62.5 MCG/ACT IN AEPB
1.0000 | INHALATION_SPRAY | Freq: Every day | RESPIRATORY_TRACT | Status: DC
  Administered 2022-11-22 – 2022-11-24 (×3): 1 via RESPIRATORY_TRACT
  Filled 2022-11-21: qty 7

## 2022-11-21 MED ORDER — ALBUTEROL SULFATE HFA 108 (90 BASE) MCG/ACT IN AERS: 2.0000 | INHALATION_SPRAY | Freq: Four times a day (QID) | RESPIRATORY_TRACT | Status: DC | PRN

## 2022-11-21 NOTE — ED Triage Notes (Addendum)
Pt states that this episode started on Sunday, states that she has been coughing and sob with chills and a temp of 100.4. pt is taking anasterol for her breast cancer. Pt placed on 2L of O2  due to sats of 89% on room air

## 2022-11-21 NOTE — ED Provider Notes (Signed)
[facility] Provider Note    Event Date/Time   First MD Initiated Contact with Patient 11/21/22 1458     (approximate)   History   Shortness of Breath and Cough   HPI  Tricia Berry is a 52 y.o. female with history of breast cancer with brain mass presents emergency department complaining of shortness of breath and headache.  Patient states her oxygen has dropped into the 80s at home.  Patient had surgery 1 month ago for reconstruction of the left breast.  States that he did take 12 lymph nodes out.  States she has had some cough and congestion.  Husband states that she has had some altered mental status that he associates with the drop in oxygen.  States headaches are worsening.  Is supposed to have an MRI on Monday to assess the status of the mass.  Symptoms have been ongoing since last week around Friday. Fever noted at her pcp of 100.4      Physical Exam   Triage Vital Signs: ED Triage Vitals  Enc Vitals Group     BP 11/21/22 1336 109/71     Pulse Rate 11/21/22 1336 89     Resp 11/21/22 1336 18     Temp 11/21/22 1336 98.6 F (37 C)     Temp Source 11/21/22 1336 Oral     SpO2 11/21/22 1336 96 %     Weight 11/21/22 1337 126 lb 12.2 oz (57.5 kg)     Height 11/21/22 1337 5' (1.524 m)     Head Circumference --      Peak Flow --      Pain Score 11/21/22 1336 4     Pain Loc --      Pain Edu? --      Excl. in [place]? --     Most recent vital signs: Vitals:   11/21/22 1601 11/21/22 1733  BP: 119/70   Pulse: 78   Resp: 12   Temp:    SpO2: 98% 100%     General: Awake, no distress.   CV:  Good peripheral perfusion. regular rate and  rhythm Resp:  Normal effort. Lungs some wheezing noted in lower lung fields Abd:  No distention.   Other:      ED Results / Procedures / Treatments   Labs (all labs ordered are listed, but only abnormal results are displayed) Labs Reviewed  COMPREHENSIVE METABOLIC PANEL - Abnormal; Notable for the  following components:      Result Value   Glucose, Bld 117 (*)    AST 44 (*)    All other components within normal limits  LACTIC ACID, PLASMA - Abnormal; Notable for the following components:   Lactic Acid, Venous 2.3 (*)    All other components within normal limits  RESP PANEL BY RT-PCR (RSV, FLU A&B, COVID)  [id number]  CULTURE, BLOOD (ROUTINE X 2)  CULTURE, BLOOD (ROUTINE X 2)  CBC  CBC WITH DIFFERENTIAL/PLATELET  LACTIC ACID, PLASMA  SEDIMENTATION RATE  C-REACTIVE PROTEIN     EKG  EKG   RADIOLOGY Chest x-ray CTA for PE, CT of the head    PROCEDURES:   .Critical Care  Performed by: Versie Starks, PA-C Authorized by: Versie Starks, PA-C   Critical care provider statement:    Critical care time (minutes):  45   Critical care time was exclusive of:  Separately billable procedures and treating other patients   Critical care was necessary to treat or  prevent imminent or life-threatening deterioration of the following conditions:  Respiratory failure and sepsis   Critical care was time spent personally by me on the following activities:  Blood draw for specimens, evaluation of patient's response to treatment, examination of patient, interpretation of cardiac output measurements, obtaining history from patient or surrogate, ordering and performing treatments and interventions, ordering and review of laboratory studies, ordering and review of radiographic studies, pulse oximetry, re-evaluation of patient's condition and review of old charts   I assumed direction of critical care for this patient from another provider in my specialty: no     Care discussed with: admitting provider      MEDICATIONS ORDERED IN ED: Medications  cefTRIAXone (ROCEPHIN) 2 g in sodium chloride 0.9 % 100 mL IVPB (0 g Intravenous Stopped 11/21/22 1659)  azithromycin (ZITHROMAX) 500 mg in sodium chloride 0.9 % 250 mL IVPB (500 mg Intravenous New Bag/Given 11/21/22 1720)  sodium chloride 0.9 % bolus  1,000 mL (1,000 mLs Intravenous New Bag/Given 11/21/22 1634)  iohexol (OMNIPAQUE) 350 MG/ML injection 75 mL (75 mLs Intravenous Contrast Given 11/21/22 1645)     IMPRESSION / MDM / Coin / ED COURSE  I reviewed the triage vital signs and the nursing notes.                              Differential diagnosis includes, but is not limited to, PE, CAP, static disease, respiratory failure, hypoxia, worsening brain mass  Patient's presentation is most consistent with acute presentation with potential threat to life or bodily function.   Patient CBC and metabolic panel are reassuring  Patient is currently on 2 L O2.  This is a new oxygen requirement for the patient.   Nursing staff noted some altered mental status with the patient.  Lactic acid is elevated at 2.3.  This indicate sepsis.  Will start antibiotics  CT of the head and reviewed interpreted by me as being negative for any acute abnormality, of note brain mass is not noted, however I did tell patient that MRI is more sensitive and that she will still need that to determine if the brain mass remains.  CTA of the chest for PE independently reviewed interpreted by me as being negative for PE, radiologist does comment on multiple infiltrates in the lungs.  I do feel that the lung infiltrates are indicative of infection in her lungs.  Due to the sepsis we will continue pneumonia treatment and admit to the hospital for sepsis and respiratory distress.  Consult to hospitalist, Dr Posey Pronto to admit   FINAL CLINICAL IMPRESSION(S) / ED DIAGNOSES   Final diagnoses:  Acute respiratory failure with hypoxia ([place])  Acute sepsis ([place])     Rx / DC Orders   ED Discharge Orders     None        Note:  This document was prepared using Dragon voice recognition software and may include unintentional dictation errors.    Versie Starks, PA-C 11/21/22 1741    Blake Divine, MD 11/21/22 2250

## 2022-11-21 NOTE — Sepsis Progress Note (Signed)
Notified bedside nurse of need to draw repeat lactic acid. 

## 2022-11-21 NOTE — H&P (Signed)
History and Physical    Patient: Tricia Berry WUJ:[id number] DOB: 04-Sep-1971 DOA: 11/21/2022 DOS: the patient was seen and examined on 11/21/2022 PCP: Burnard Hawthorne, FNP  Patient coming from: Home/PCP office  Chief Complaint:  Chief Complaint  Patient presents with   Shortness of Breath   Cough   HPI: ANALISIA Berry is a 52 y.o. female with medical history significant of left breast cancer which showed invasive mammary carcinoma grade 2. Patient underwent chemotherapy along with bilateral mastectomy with reconstruction. Patient came to the emergency room after she was then by PCP office for evaluation of hypoxia. She has noticed lately that her oxygen sats drop in the lower 80s. She had some confusion earlier per family member. Patient also had some headache congestion and chills. She had fever of 100.4 at home.  Patient recently saw pulmonologist at [place]. She has known history of bilateral pulmonary groundless opacities are in her previous CT scan of the chest. Given her shortness of breath hypoxia low-grade fever and CT chest suggestive of persistent ground loss opacity/infiltrated will admit her for acute hypoxic respiratory failure and sepsis given her fever of 100.4, abnormal CT chest and elevated lactate of 2.3.  She has lactic of 2.3. She received IV fluids dose of IV Rocephin and Zithromax. She also got breathing treatment.   Review of Systems: As mentioned in the history of present illness. All other systems reviewed and are negative. Past Medical History:  Diagnosis Date   Anxiety    Breast cancer ([place]) 12/2021   left breast IMC   Diverticulitis    Family history of ovarian cancer    GERD (gastroesophageal reflux disease)    IBS (irritable bowel syndrome)    Pneumonia    PONV (postoperative nausea and vomiting)    severe migraine and vomiting post anesthesia   Scoliosis    Past Surgical History:  Procedure Laterality Date   ABDOMINAL  HYSTERECTOMY     still has ovaries, no gyn cancer, hysterectomy due to endometriosis. NO cervix on exam 01/10/21   BREAST BIOPSY Left 09/15/2020   [place] bx of mass, path pending, Q marker   BREAST BIOPSY Left 09/15/2020   [place] bx of LN, hydromarker, path pending   BREAST RECONSTRUCTION WITH PLACEMENT OF TISSUE EXPANDER AND FLEX HD (ACELLULAR HYDRATED DERMIS) Bilateral 03/17/2021   Procedure: IMMEDIATE BILATERAL BREAST RECONSTRUCTION WITH PLACEMENT OF TISSUE EXPANDER AND FLEX HD (ACELLULAR HYDRATED DERMIS);  Surgeon: Wallace Going, DO;  Location: [place];  Service: Plastics;  Laterality: Bilateral;   CAPSULECTOMY Right 10/18/2022   Procedure: Right breast capsule release with adjustment;  Surgeon: Wallace Going, DO;  Location: [place];  Service: Plastics;  Laterality: Right;   COLONOSCOPY WITH PROPOFOL N/A 11/21/2021   Procedure: COLONOSCOPY WITH PROPOFOL;  Surgeon: Lin Landsman, MD;  Location: [facility] ENDOSCOPY;  Service: Gastroenterology;  Laterality: N/A;   IR IMAGING GUIDED PORT INSERTION  10/01/2020   MODIFIED MASTECTOMY Left 03/17/2021   Procedure: LEFT MODIFIED RADICAL MASTECTOMY;  Surgeon: Erroll Luna, MD;  Location: [place];  Service: General;  Laterality: Left;   PORTA CATH REMOVAL Right 03/17/2021   Procedure: PORTA CATH REMOVAL;  Surgeon: Erroll Luna, MD;  Location: [place];  Service: General;  Laterality: Right;   REMOVAL OF BILATERAL TISSUE EXPANDERS WITH PLACEMENT OF BILATERAL BREAST IMPLANTS Bilateral 05/09/2021   Procedure: REMOVAL OF BILATERAL TISSUE EXPANDERS WITH PLACEMENT OF BILATERAL BREAST IMPLANTS;  Surgeon: Audelia Hives  S, DO;  Location: [place];  Service: Plastics;  Laterality: Bilateral;  90 min   TOTAL MASTECTOMY Right 03/17/2021   Procedure: TOTAL MASTECTOMY;  Surgeon: Erroll Luna, MD;  Location: [place];  Service: General;  Laterality:  Right;   Social History:  reports that she has been smoking cigarettes. She has a 10.00 pack-year smoking history. She has never used smokeless tobacco. She reports that she does not currently use alcohol. She reports that she does not use drugs.  Allergies  Allergen Reactions   Morphine Nausea And Vomiting and Other (See Comments)    migranes Other reaction(s): Headache Migraine, vomiting   Sertraline Hcl     REACTION: Worsened symptoms of IBS   Sulfa Antibiotics Rash   Sulfamethoxazole Rash   Sulfonamide Derivatives Rash    Family History  Problem Relation Age of Onset   Hypertension Mother    Osteoarthritis Mother    Diverticulitis Mother    Heart failure Father    Hypertension Father    Gout Father    Heart attack Father 38   Diverticulitis Brother    Ovarian cancer Paternal Grandmother     Prior to Admission medications   Medication Sig Start Date End Date Taking? Authorizing Provider  albuterol (VENTOLIN HFA) 108 (90 Base) MCG/ACT inhaler Inhale 2 puffs into the lungs every 6 (six) hours as needed for wheezing or shortness of breath. 11/01/22   Burnard Hawthorne, FNP  anastrozole (ARIMIDEX) 1 MG tablet Take 1 tablet (1 mg total) by mouth daily. 08/30/22   Sindy Guadeloupe, MD  baclofen (LIORESAL) 10 MG tablet Take 1 tablet (10 mg total) by mouth 3 (three) times daily as needed for muscle spasms. 10/17/22   Vaslow, Acey Lav, MD  Budeson-Glycopyrrol-Formoterol (BREZTRI AEROSPHERE) 160-9-4.8 MCG/ACT AERO Inhale 2 puffs into the lungs 2 (two) times daily. 11/01/22   Burnard Hawthorne, FNP  cyanocobalamin (VITAMIN B12) 1000 MCG/ML injection 1000 mcg (1 mL) intramuscular injection in the thigh ( vastus lateralis) once per month. 11/16/21   Burnard Hawthorne, FNP  FLUoxetine (PROZAC) 20 MG capsule Take 1 capsule (20 mg total) by mouth every morning. 05/02/22   Burnard Hawthorne, FNP  Lactobacillus (PROBIOTIC ACIDOPHILUS PO) Take 1 capsule by mouth daily.    [provider]   LORazepam (ATIVAN) 0.5 MG tablet Take 1 tablet (0.5 mg total) by mouth at bedtime. 10/17/22   Kennyth Arnold, FNP  Multiple Vitamins-Minerals (MULTIVITAL PO) Take 1 Dose by mouth daily.    [provider]  naloxone [facility]) nasal spray 4 mg/0.1 mL Place 1 spray into the nose as needed for opioid-induced respiratory depresssion. In case of emergency (overdose), spray once into each nostril. If no response within 3 minutes, repeat application and call [phone]. 08/14/22 08/14/23  Milinda Pointer, MD  nicotine (NICODERM CQ - DOSED IN MG/24 HOURS) 21 mg/24hr patch use 1 patch daily 08/22/22 08/22/23  Letta Median, RPH  oxyCODONE (OXY IR/ROXICODONE) 5 MG immediate release tablet Take 1 tablet (5 mg total) by mouth 2 (two) times daily. Must last 30 days. 11/15/22 12/17/22  Milinda Pointer, MD  pregabalin (LYRICA) 150 MG capsule Take 1 capsule (150 mg total) by mouth in the morning, at noon, and at bedtime. 10/04/22   Burnard Hawthorne, FNP  rosuvastatin (CRESTOR) 5 MG tablet Take 5 mg by mouth daily.    [provider]  trimethoprim (TRIMPEX) 100 MG tablet Take 1 tablet (100 mg total) by mouth daily.  04/26/22   Hollice Espy, MD    Physical Exam: Vitals:   11/21/22 1336 11/21/22 1337 11/21/22 1601 11/21/22 1733  BP: 109/71  119/70   Pulse: 89  78   Resp: 18  12   Temp: 98.6 F (37 C)     TempSrc: Oral  Oral   SpO2: 96%  98% 100%  Weight:  57.5 kg 56.7 kg   Height:  5' (1.524 m) 5' (1.524 m)    Physical Exam Constitutional:      Appearance: She is well-developed.  HENT:     Head: Normocephalic and atraumatic.  Cardiovascular:     Rate and Rhythm: Normal rate and regular rhythm.  Pulmonary:     Effort: Pulmonary effort is normal.     Breath sounds: Normal breath sounds.  Abdominal:     General: Bowel sounds are normal.     Palpations: Abdomen is soft.  Skin:    General: Skin is warm and dry.  Neurological:     General: No focal deficit present.     Mental  Status: She is alert and oriented to person, place, and time.     Assessment and Plan: AUDRYANNA ZURITA is a 51 y.o. female with medical history significant of left breast cancer which showed invasive mammary carcinoma grade 2. Patient underwent chemotherapy along with bilateral mastectomy with reconstruction. Patient came to the emergency room after she was then by PCP office for evaluation of hypoxia. She has noticed lately that her oxygen sats drop in the lower 80s. She had some confusion earlier per family member. Patient also had some headache congestion and chills. She had fever of 100.4 at home.  Acute hypoxic respiratory failure with abnormal CT scan showing bilateral ground glass opacity H/o Smoking with likley underlying COPD -- patient was noted to have oxygen saturations in the 80-85%s at PCP's office. She also had some altered mental status. -- She was sent to the emergency room for further evaluation. -- Currently sats are 94 to 96% on 2 L. Patient mentoring well. -- Will continue PRN breathing treatments. -- I will have pulmonary see patient in the morning. -- Assess for home oxygen --pt not wheezing and not in resp distress--hold steroids  Sepsis -- fever of 100.4, elevated lactate of 2.3 and abnormal CT chest with bilateral ground glass opacity/infiltrated -- continue IV Rocephin and Zithromax. Will de-escalate antibiotic once test results are back.- --Follow-up blood culture- --COVID/influenza/RSV negative- --monitor fever curve -- check C-reactive protein and ESR  Chronic pain/left breast cancer status post bilateral mastectomy with reconstruction surgery -- patient follows at the pain clinic. Will resume pain meds, muscle relaxin, Lyrica -- consult oncology Dr. Janese Banks  hyperlipidemia continue statins      Advance Care Planning:   Code Status: Full Code d/wwith patient  Consults: oncology, pulmonology  Family Communication: no family in the ER  Severity of  Illness: The appropriate patient status for this patient is INPATIENT. Inpatient status is judged to be reasonable and necessary in order to provide the required intensity of service to ensure the patient's safety. The patient's presenting symptoms, physical exam findings, and initial radiographic and laboratory data in the context of their chronic comorbidities is felt to place them at high risk for further clinical deterioration. Furthermore, it is not anticipated that the patient will be medically stable for discharge from the hospital within 2 midnights of admission.   * I certify that at the point of admission it is my clinical judgment that  the patient will require inpatient hospital care spanning beyond 2 midnights from the point of admission due to high intensity of service, high risk for further deterioration and high frequency of surveillance required.*  Author: Fritzi Mandes, MD 11/21/2022 5:57 PM  For on call review [email or web address].

## 2022-11-21 NOTE — Progress Notes (Signed)
Assessment & Plan:  Acute cough -     POC COVID-19 [employer] -     POCT Influenza A/B  Hypoxia Assessment & Plan: Flu and covid negative. Walking SaO2 dropped to 85%.  Given patient 2 L oxygen on nasal cannula while in office and oxygen rebounded to 96%.  When patient took off oxygen in the room while sitting SaO2 90 to 91%.  Concerned for hypoxia, complicated by recent PNA, immunocompromise, copd.  Advised patient that she would need to go immediately to emergency room and I anticipate admission for hypoxia.  Advised to go by ambulance and patient declined.  She is adamant that she would like her partner to drive her to [facility].  She understands the risk of this. Triage report given to Ariel in ED and [facility]      Return precautions given.   Risks, benefits, and alternatives of the medications and treatment plan prescribed today were discussed, and patient expressed understanding.   Education regarding symptom management and diagnosis given to patient on AVS either electronically or printed.  No follow-ups on file.  Mable Paris, FNP  Subjective:    Patient ID: Tricia Berry, female    DOB: [birth year], 52 y.o.   MRN: [id number]  CC: Tricia Berry is a 52 y.o. female who presents today for an acute visit.    HPI: Accompanied by partner today   complains of increased sob, productive cough past 2 days  Sa02  has been as low as 79 at home when sitting.   Endorses chills, sinus congestion and HA.   Tmax 100.4  No cp.    She is taking albuterol , breztri.     Established care with pulmonology 11/17/2022 for shortness of breath. She is pending PFTs.  Allergies: Morphine, Sertraline hcl, Sulfa antibiotics, Sulfamethoxazole, and Sulfonamide derivatives Current Outpatient Medications on File Prior to Visit  Medication Sig Dispense Refill   albuterol (VENTOLIN HFA) 108 (90 Base) MCG/ACT inhaler Inhale 2 puffs into the lungs every 6 (six) hours as needed for  wheezing or shortness of breath. 6.7 g 0   anastrozole (ARIMIDEX) 1 MG tablet Take 1 tablet (1 mg total) by mouth daily. 30 tablet 5   baclofen (LIORESAL) 10 MG tablet Take 1 tablet (10 mg total) by mouth 3 (three) times daily as needed for muscle spasms. 15 tablet 1   Budeson-Glycopyrrol-Formoterol (BREZTRI AEROSPHERE) 160-9-4.8 MCG/ACT AERO Inhale 2 puffs into the lungs 2 (two) times daily. 10.7 g 11   cyanocobalamin (VITAMIN B12) 1000 MCG/ML injection 1000 mcg (1 mL) intramuscular injection in the thigh ( vastus lateralis) once per month. 3 mL 4   FLUoxetine (PROZAC) 20 MG capsule Take 1 capsule (20 mg total) by mouth every morning. 90 capsule 3   Lactobacillus (PROBIOTIC ACIDOPHILUS PO) Take 1 capsule by mouth daily.     LORazepam (ATIVAN) 0.5 MG tablet Take 1 tablet (0.5 mg total) by mouth at bedtime. 30 tablet 2   Multiple Vitamins-Minerals (MULTIVITAL PO) Take 1 Dose by mouth daily.     naloxone (NARCAN) nasal spray 4 mg/0.1 mL Place 1 spray into the nose as needed for opioid-induced respiratory depresssion. In case of emergency (overdose), spray once into each nostril. If no response within 3 minutes, repeat application and call 734. 2 each 0   nicotine (NICODERM CQ - DOSED IN MG/24 HOURS) 21 mg/24hr patch use 1 patch daily 28 patch 0   oxyCODONE (OXY IR/ROXICODONE) 5 MG immediate release tablet Take 1 tablet (5  mg total) by mouth 2 (two) times daily. Must last 30 days. 60 tablet 0   pregabalin (LYRICA) 150 MG capsule Take 1 capsule (150 mg total) by mouth in the morning, at noon, and at bedtime. 90 capsule 2   rosuvastatin (CRESTOR) 5 MG tablet Take 5 mg by mouth daily.     trimethoprim (TRIMPEX) 100 MG tablet Take 1 tablet (100 mg total) by mouth daily. 90 tablet 3   Current Facility-Administered Medications on File Prior to Visit  Medication Dose Route Frequency Provider Last Rate Last Admin   acetaminophen (TYLENOL) 325 MG tablet            diphenhydrAMINE (BENADRYL) 25 mg capsule             heparin lock flush 100 unit/mL  500 Units Intravenous Once Sindy Guadeloupe, MD       Zoledronic Acid (ZOMETA) 4 MG/100ML IVPB             Review of Systems  Constitutional:  Positive for chills. Negative for fever.  HENT:  Positive for congestion and sinus pain.   Respiratory:  Positive for cough and shortness of breath.   Cardiovascular:  Negative for chest pain and palpitations.  Gastrointestinal:  Negative for nausea and vomiting.  Neurological:  Positive for headaches.      Objective:    BP 138/80   Pulse 95   Temp 98.7 F (37.1 C) (Oral)   Ht 5' (1.524 m)   Wt 126 lb 12.8 oz (57.5 kg)   SpO2 (!) 85% Comment: walking without o2  BMI 24.76 kg/m   BP Readings from Last 3 Encounters:  11/21/22 109/71  11/21/22 138/80  11/17/22 120/72   Wt Readings from Last 3 Encounters:  11/21/22 126 lb 12.2 oz (57.5 kg)  11/21/22 126 lb 12.8 oz (57.5 kg)  11/17/22 125 lb (56.7 kg)    Physical Exam Vitals reviewed.  Constitutional:      Appearance: She is well-developed.  Eyes:     Conjunctiva/sclera: Conjunctivae normal.  Cardiovascular:     Rate and Rhythm: Normal rate and regular rhythm.     Pulses: Normal pulses.     Heart sounds: Normal heart sounds.  Pulmonary:     Effort: Pulmonary effort is normal.     Breath sounds: Normal breath sounds. No wheezing, rhonchi or rales.  Skin:    General: Skin is warm and dry.  Neurological:     Mental Status: She is alert.  Psychiatric:        Speech: Speech normal.        Behavior: Behavior normal.        Thought Content: Thought content normal.

## 2022-11-21 NOTE — Assessment & Plan Note (Addendum)
Flu and covid negative. Walking SaO2 dropped to 85%.  Given patient 2 L oxygen on nasal cannula while in office and oxygen rebounded to 96%.  When patient took off oxygen in the room while sitting SaO2 90 to 91%.  Concerned for hypoxia, complicated by recent PNA, immunocompromise, copd.  Advised patient that she would need to go immediately to emergency room and I anticipate admission for hypoxia.  Advised to go by ambulance and patient declined.  She is adamant that she would like her partner to drive her to [facility].  She understands the risk of this. Triage report given to Ariel in ED and [facility]

## 2022-11-21 NOTE — Sepsis Progress Note (Signed)
Code Sepsis protocol being monitored by eLink. ?

## 2022-11-21 NOTE — ED Notes (Signed)
Pt continues to bend arm so NS bolus is not yet complete. Causing delay in 2nd latic draw.

## 2022-11-21 NOTE — Progress Notes (Signed)
CODE SEPSIS - PHARMACY COMMUNICATION  **Broad Spectrum Antibiotics should be administered within 1 hour of Sepsis diagnosis**  Time Code Sepsis Called/Page Received: 1628  Antibiotics Ordered: Azithromycin, CRO  Time of 1st antibiotic administration: 1634  Additional action taken by pharmacy: N/A    Alison Murray ,PharmD Clinical Pharmacist  11/21/2022  4:30 PM

## 2022-11-22 ENCOUNTER — Encounter: Payer: Self-pay | Admitting: *Deleted

## 2022-11-22 ENCOUNTER — Telehealth: Payer: Self-pay | Admitting: Student

## 2022-11-22 ENCOUNTER — Telehealth: Payer: Self-pay | Admitting: Family

## 2022-11-22 DIAGNOSIS — E785 Hyperlipidemia, unspecified: Secondary | ICD-10-CM

## 2022-11-22 DIAGNOSIS — J9601 Acute respiratory failure with hypoxia: Secondary | ICD-10-CM | POA: Diagnosis not present

## 2022-11-22 DIAGNOSIS — C50012 Malignant neoplasm of nipple and areola, left female breast: Secondary | ICD-10-CM

## 2022-11-22 DIAGNOSIS — Z720 Tobacco use: Secondary | ICD-10-CM

## 2022-11-22 DIAGNOSIS — J189 Pneumonia, unspecified organism: Secondary | ICD-10-CM | POA: Diagnosis not present

## 2022-11-22 DIAGNOSIS — G8929 Other chronic pain: Secondary | ICD-10-CM

## 2022-11-22 LAB — RESPIRATORY PANEL BY PCR

## 2022-11-22 MED ORDER — ALBUTEROL SULFATE (2.5 MG/3ML) 0.083% IN NEBU
2.5000 mg | INHALATION_SOLUTION | Freq: Four times a day (QID) | RESPIRATORY_TRACT | Status: DC
  Administered 2022-11-22 – 2022-11-23 (×4): 2.5 mg via RESPIRATORY_TRACT
  Filled 2022-11-22 (×4): qty 3

## 2022-11-22 MED ORDER — GUAIFENESIN-DM 100-10 MG/5ML PO SYRP
5.0000 mL | ORAL_SOLUTION | ORAL | Status: DC | PRN
  Administered 2022-11-22 – 2022-11-23 (×4): 5 mL via ORAL
  Filled 2022-11-22 (×4): qty 10

## 2022-11-22 MED ORDER — ACETAMINOPHEN 325 MG PO TABS
650.0000 mg | ORAL_TABLET | Freq: Four times a day (QID) | ORAL | Status: DC | PRN
  Administered 2022-11-22 – 2022-11-24 (×5): 650 mg via ORAL
  Filled 2022-11-22 (×5): qty 2

## 2022-11-22 MED ORDER — SODIUM CHLORIDE 3 % IN NEBU
4.0000 mL | INHALATION_SOLUTION | Freq: Once | RESPIRATORY_TRACT | Status: AC | PRN
  Administered 2022-11-23: 4 mL via RESPIRATORY_TRACT
  Filled 2022-11-22: qty 4

## 2022-11-22 MED ORDER — ANASTROZOLE 1 MG PO TABS
1.0000 mg | ORAL_TABLET | Freq: Every day | ORAL | Status: DC
  Administered 2022-11-22 – 2022-11-24 (×3): 1 mg via ORAL
  Filled 2022-11-22 (×3): qty 1

## 2022-11-22 MED ORDER — METHYLPREDNISOLONE SODIUM SUCC 40 MG IJ SOLR
40.0000 mg | Freq: Every day | INTRAMUSCULAR | Status: DC
  Administered 2022-11-22 – 2022-11-24 (×3): 40 mg via INTRAVENOUS
  Filled 2022-11-22 (×3): qty 1

## 2022-11-22 MED ORDER — DOCUSATE SODIUM 100 MG PO CAPS
100.0000 mg | ORAL_CAPSULE | Freq: Two times a day (BID) | ORAL | Status: DC | PRN
  Administered 2022-11-23: 100 mg via ORAL
  Filled 2022-11-22: qty 1

## 2022-11-22 NOTE — Assessment & Plan Note (Signed)
Nicotine patch

## 2022-11-22 NOTE — Progress Notes (Addendum)
Patient's Oxygen saturation on room air at rest was 85%. Patient placed on 2 liters of oxygen via nasal canula and oxygen improved to 95% at rest; patient's oxygen dropped to 88% on 2 liters via nasal canula while walking; oxygen improved to 93% on 3 liters while walking.   Agree with need for oxygen at home. DR Leslye Peer

## 2022-11-22 NOTE — Consult Note (Signed)
NAME:  Tricia Berry, MRN:  [id number], DOB:  1971/10/04, LOS: 1 ADMISSION DATE:  11/21/2022, CONSULTATION DATE:  11/22/2022 REFERRING MD:  Loletha Grayer, MD CHIEF COMPLAINT:  Acute Hypoxic Respiratory Failure   History of Present Illness:   Tricia Berry is a 52 year old female with a past medical history of breast cancer who was seen by me in clinic on 11/17/2022 for shortness of breath. She is admitted to the hospital for the management of acute hypoxic respiratory failure.  During her clinic visit, she reported insidious symptoms in November with shortness of breath and severe hypoxia down to 87% SpO2. CXR at the time showed findings concerning for pneumonia followed by CT scan showing multi-focal infiltrates. Her main symptoms were those of dyspnea and cough. The symptoms started in the summer around the time that Abemaciclib was initiated.  She first noticed a cough that happened throughout the summer and culminated in the sudden onset of shortness of breath. Following this, she was initiated on a course of antibiotics and abemaciclib was halted.  Her symptoms have slowly resolved but she continues to report exertional dyspnea with some mild wheezing that occurs at night.  She was started on Breztri by her primary care physician which she feels was tremendously helpful. She was feeling much improved during our clinic visit and her oxygen saturations were 97% on room air. She was planned for a chest CT and PFT's.  She reports that a few days after our visit her symptoms worsened and she became increasingly short of breath, with increasing cough. She was seen by her primary care physician and was noted to be very hypoxic, and was sent to the ED. In addition to the dyspnea, she reports a cough as well as chills. These symptoms developed acutely over the weekend.   The patient has a past medical history of invasive carcinoma of the left breast (stage III, ER/PR/HER2 positive) status post  neoadjuvant chemotherapy, bilateral mastectomy, and reconstruction.  She has received a course of Pertuzumab and Trastuzumab following her resection. She had been on Abemaciclib which was stopped in November of 2023 and has not been initiated since.   Patient used to work as an Warden/ranger. She is a smoker, and has smoked on and off since her teenage years. She reports having mold in their bathroom that was professionally remediated.  Objective   Blood pressure 90/67, pulse 83, temperature 98.1 F (36.7 C), resp. rate 17, height 5' (1.524 m), weight 56.7 kg, SpO2 97 %.        Intake/Output Summary (Last 24 hours) at 11/22/2022 1817 Last data filed at 11/21/2022 1831 Gross per 24 hour  Intake 1250 ml  Output --  Net 1250 ml   Filed Weights   11/21/22 1337 11/21/22 1601  Weight: 57.5 kg 56.7 kg    Examination: Physical Exam Constitutional:      Appearance: She is well-developed. She is not ill-appearing.  HENT:     Head: Normocephalic.     Mouth/Throat:     Mouth: Mucous membranes are moist.  Eyes:     Pupils: Pupils are equal, round, and reactive to light.  Cardiovascular:     Rate and Rhythm: Normal rate and regular rhythm.     Pulses: Normal pulses.     Heart sounds: Normal heart sounds.  Pulmonary:     Effort: Pulmonary effort is normal. No respiratory distress.     Breath sounds: Rhonchi and rales present.  Neurological:  General: No focal deficit present.     Mental Status: She is alert and oriented to person, place, and time. Mental status is at baseline.      Assessment & Plan:   #Acute Hypoxic Respiratory Failure  She had presented to clinic for the evaluation of shortness of breath in the setting of known breast cancer s/p chemotherapy, resection, and immune therapy. She received Abemaciclib over the summer which was discontinued in November of 2023. Her symptoms acutely worsened prompting admission to the hospital.  Her CT is showing multi-focal infiltrates  with ground glass and consolidative opacities. Given the acute worsening of symptoms, my differential includes inflammatory (drug induced pneumonitis from abemaciclib, organizing pneumonia, hypersensitivity pneumonitis given mold exposure at home, DIP/Desquamative Interstitial Pneumonia given smoking) and infectious (bacterial, viral, fungal, PJP, cryptococcus) etiologies. Abemaciclib could have a sustained effect with pneumonitis, but it would be unusual for it to acutely worsen in this fashion sans re-introduction. Hypersensitivity pneumonitis, organizing pneumonia, and an infectious etiology are higher on my differential.  Agree with antibiotics and steroids at the current dose of 40 mg daily. Patient would benefit from sputum induction to rule out PJP and fungal infections, in addition to a full respiratory panel to rule out a viral illness. I would also recommend obtaining beta-d-glucan, aspergillus Glucomannan, and a cryptococcal antigen levels. Would recommend a prolonged taper steroid taper pending findings from the infectious workup. Should she not be able to induce sputum, would consider flexibly bronchoscopy for airway survey. Would recommend full smoking cessation.   Labs   CBC: Recent Labs  Lab 11/21/22 1341 11/21/22 1342  WBC 10.4 10.4  NEUTROABS 7.3  --   HGB 12.7 12.7  HCT 39.9 39.8  MCV 96.1 95.7  PLT 165 539    Basic Metabolic Panel: Recent Labs  Lab 11/21/22 1342  NA 135  K 3.9  CL 102  CO2 27  GLUCOSE 117*  BUN 7  CREATININE 0.92  CALCIUM 9.0   GFR: Estimated Creatinine Clearance: 56.5 mL/min (by C-G formula based on SCr of 0.92 mg/dL). Recent Labs  Lab 11/21/22 1341 11/21/22 1342 11/21/22 1522 11/21/22 1832  WBC 10.4 10.4  --   --   LATICACIDVEN  --   --  2.3* 1.0    Liver Function Tests: Recent Labs  Lab 11/21/22 1342  AST 44*  ALT 41  ALKPHOS 66  BILITOT 0.7  PROT 7.2  ALBUMIN 3.8   No results for input(s): "LIPASE", "AMYLASE" in the last  168 hours. No results for input(s): "AMMONIA" in the last 168 hours.  ABG    Component Value Date/Time   TCO2 27 05/03/2008 2107     Coagulation Profile: No results for input(s): "INR", "PROTIME" in the last 168 hours.  Cardiac Enzymes: No results for input(s): "CKTOTAL", "CKMB", "CKMBINDEX", "TROPONINI" in the last 168 hours.  HbA1C: No results found for: "HGBA1C"  CBG: No results for input(s): "GLUCAP" in the last 168 hours.  Review of systems: per HPI  Past Medical History:  She,  has a past medical history of Anxiety, Breast cancer ([place]) (12/2021), Diverticulitis, Family history of ovarian cancer, GERD (gastroesophageal reflux disease), IBS (irritable bowel syndrome), Pneumonia, PONV (postoperative nausea and vomiting), and Scoliosis.   Surgical History:   Past Surgical History:  Procedure Laterality Date   ABDOMINAL HYSTERECTOMY     still has ovaries, no gyn cancer, hysterectomy due to endometriosis. NO cervix on exam 01/10/21   BREAST BIOPSY Left 09/15/2020   [place] bx of mass,  path pending, Q marker   BREAST BIOPSY Left 09/15/2020   [place] bx of LN, hydromarker, path pending   BREAST RECONSTRUCTION WITH PLACEMENT OF TISSUE EXPANDER AND FLEX HD (ACELLULAR HYDRATED DERMIS) Bilateral 03/17/2021   Procedure: IMMEDIATE BILATERAL BREAST RECONSTRUCTION WITH PLACEMENT OF TISSUE EXPANDER AND FLEX HD (ACELLULAR HYDRATED DERMIS);  Surgeon: Wallace Going, DO;  Location: [facility];  Service: Plastics;  Laterality: Bilateral;   CAPSULECTOMY Right 10/18/2022   Procedure: Right breast capsule release with adjustment;  Surgeon: Wallace Going, DO;  Location: [facility];  Service: Plastics;  Laterality: Right;   COLONOSCOPY WITH PROPOFOL N/A 11/21/2021   Procedure: COLONOSCOPY WITH PROPOFOL;  Surgeon: Lin Landsman, MD;  Location[facility] END[facility];  Service: Gastroenterology;  Laterality: N/A;   IR IMAGING GUIDED PORT INSERTION  10/01/2020    MODIFIED MASTECTOMY Left 03/17/2021   Procedure: LEFT MODIFIED RADICAL MASTECTOMY;  Surgeon: Erroll Luna, MD;  Location: [place];  Service: General;  Laterality: Left;   PORTA CATH REMOVAL Right 03/17/2021   Procedure: PORTA CATH REMOVAL;  Surgeon: Erroll Luna, MD;  Location: [place];  Service: General;  Laterality: Right;   REMOVAL OF BILATERAL TISSUE EXPANDERS WITH PLACEMENT OF BILATERAL BREAST IMPLANTS Bilateral 05/09/2021   Procedure: REMOVAL OF BILATERAL TISSUE EXPANDERS WITH PLACEMENT OF BILATERAL BREAST IMPLANTS;  Surgeon: Wallace Going, DO;  Location: [place];  Service: Plastics;  Laterality: Bilateral;  90 min   TOTAL MASTECTOMY Right 03/17/2021   Procedure: TOTAL MASTECTOMY;  Surgeon: Erroll Luna, MD;  Location: [place];  Service: General;  Laterality: Right;     Social History:   reports that she has been smoking cigarettes. She has a 10.00 pack-year smoking history. She has never used smokeless tobacco. She reports that she does not currently use alcohol. She reports that she does not use drugs.   Family History:  Her family history includes Diverticulitis in her brother and mother; Gout in her father; Heart attack (age of onset: 52) in her father; Heart failure in her father; Hypertension in her father and mother; Osteoarthritis in her mother; Ovarian cancer in her paternal grandmother.   Allergies Allergies  Allergen Reactions   Morphine Nausea And Vomiting and Other (See Comments)    migranes Other reaction(s): Headache Migraine, vomiting   Sertraline Hcl     REACTION: Worsened symptoms of IBS   Sulfa Antibiotics Rash   Sulfamethoxazole Rash   Sulfonamide Derivatives Rash     Home Medications  Prior to Admission medications   Medication Sig Start Date End Date Taking? Authorizing Provider  albuterol (VENTOLIN HFA) 108 (90 Base) MCG/ACT inhaler Inhale 2 puffs into the lungs every 6  (six) hours as needed for wheezing or shortness of breath. 11/01/22  Yes Arnett, Yvetta Coder, FNP  anastrozole (ARIMIDEX) 1 MG tablet Take 1 tablet (1 mg total) by mouth daily. 08/30/22  Yes Sindy Guadeloupe, MD  baclofen (LIORESAL) 10 MG tablet Take 1 tablet (10 mg total) by mouth 3 (three) times daily as needed for muscle spasms. 10/17/22  Yes Vaslow, Acey Lav, MD  Budeson-Glycopyrrol-Formoterol (BREZTRI AEROSPHERE) 160-9-4.8 MCG/ACT AERO Inhale 2 puffs into the lungs 2 (two) times daily. 11/01/22  Yes Burnard Hawthorne, FNP  cyanocobalamin (VITAMIN B12) 1000 MCG/ML injection 1000 mcg (1 mL) intramuscular injection in the thigh ( vastus lateralis) once per month. 11/16/21  Yes Arnett, Yvetta Coder, FNP  FLUoxetine (PROZAC) 20 MG capsule Take 1 capsule (20  mg total) by mouth every morning. 05/02/22  Yes Arnett, Yvetta Coder, FNP  Lactobacillus (PROBIOTIC ACIDOPHILUS PO) Take 1 capsule by mouth daily.   Yes [provider]  LORazepam (ATIVAN) 0.5 MG tablet Take 1 tablet (0.5 mg total) by mouth at bedtime. 10/17/22  Yes Dutch Quint B, FNP  Multiple Vitamins-Minerals (MULTIVITAL PO) Take 1 Dose by mouth daily.   Yes [provider]  naloxone (NARCAN) nasal spray 4 mg/0.1 mL Place 1 spray into the nose as needed for opioid-induced respiratory depresssion. In case of emergency (overdose), spray once into each nostril. If no response within 3 minutes, repeat application and call 242. 08/14/22 08/14/23 Yes Milinda Pointer, MD  nicotine (NICODERM CQ - DOSED IN MG/24 HOURS) 21 mg/24hr patch use 1 patch daily 08/22/22 08/22/23 Yes Nicks, Ewing Schlein, RPH  oxyCODONE (OXY IR/ROXICODONE) 5 MG immediate release tablet Take 1 tablet (5 mg total) by mouth 2 (two) times daily. Must last 30 days. 11/15/22 12/17/22 Yes Milinda Pointer, MD  pregabalin (LYRICA) 150 MG capsule Take 1 capsule (150 mg total) by mouth in the morning, at noon, and at bedtime. 10/04/22  Yes Arnett, Yvetta Coder, FNP  rosuvastatin  (CRESTOR) 5 MG tablet Take 5 mg by mouth daily.   Yes [provider]  trimethoprim (TRIMPEX) 100 MG tablet Take 1 tablet (100 mg total) by mouth daily. 04/26/22  Yes Hollice Espy, MD     Total care time: 78 minutes    Armando Reichert, MD [facility] 11/22/2022 6:30 PM

## 2022-11-22 NOTE — Assessment & Plan Note (Signed)
On Lyrica and oxycodone

## 2022-11-22 NOTE — Telephone Encounter (Signed)
Sent  letter ...and faxed all paperwork to the [facility] and received ok that they received it.    Informing them at the  [facility] that the patient is currently admitted to the hospital as of yesterday for sepsis, acute respiratory failure.  She will need extension of leave of absence and treatment plan as outlined at time of discharge.       Also sent the following.Marland KitchenMarland KitchenCXR from 10/10/22,  OV notes from Dr Genia Harold 11/17/22 and myself on 11/01/22, medication list

## 2022-11-22 NOTE — TOC CM/SW Note (Signed)
CSW acknowledges consult for home health/DME needs. Patient currently on acute oxygen. Will follow for this potential need. Please consult PT and OT if needed.  Dayton Scrape, [place]

## 2022-11-22 NOTE — Telephone Encounter (Signed)
See [facility] paperwork  Please write letter informing [place] that the patient is currently admitted to the hospital as of yesterday for sepsis, acute respiratory failure.  She will need extension of leave of absence and treatment plan as outlined at time of discharge.     For previous dates requested-  Please send CXR from 10/10/22,  OV notes from Dr Genia Harold 11/17/22 and myself on 11/01/22, medication list

## 2022-11-22 NOTE — Assessment & Plan Note (Signed)
Continue Arimidex.  Follow-up as outpatient.

## 2022-11-22 NOTE — Telephone Encounter (Signed)
Ok thank you

## 2022-11-22 NOTE — Hospital Course (Addendum)
52 y.o. female with medical history significant of left breast cancer which showed invasive mammary carcinoma grade 2. Patient underwent chemotherapy along with bilateral mastectomy with reconstruction. Patient came to the emergency room after she was then by PCP office for evaluation of hypoxia. She has noticed lately that her oxygen sats drop in the lower 80s. She had some confusion earlier per family member. Patient also had some headache congestion and chills. She had fever of 100.4 at home.   Patient recently saw pulmonologist at [place]. She has known history of bilateral pulmonary groundless opacities are in her previous CT scan of the chest. Given her shortness of breath hypoxia low-grade fever and CT chest suggestive of persistent ground loss opacity/infiltrated will admit her for acute hypoxic respiratory failure and sepsis given her fever of 100.4, abnormal CT chest and elevated lactate of 2.3  1/31.  Patient feeling better but was interested in going home.  Does qualify for oxygen at home with pulse ox of 85% on room air.  I added Solu-Medrol and will continue IV antibiotics today. 2/1.  Patient unable to bring up any sputum even with induction.  Pulmonary would like to get a sputum sample.  Made n.p.o. for bronchoscopy for tomorrow. 2/2.  Patient spoke with pulmonary Dr. Genia Harold and wanted to hold off on bronchoscopy.  Pulmonary wanted to do 40 mg of prednisone daily until seen as outpatient.  Patient will go home on oxygen.

## 2022-11-22 NOTE — Plan of Care (Signed)

## 2022-11-22 NOTE — Progress Notes (Signed)
Progress Note   Patient: Tricia Berry QQI:[id number] DOB: [birth year] DOA: 11/21/2022     1 DOS: the patient was seen and examined on 11/22/2022   Brief hospital course: 52 y.o. female with medical history significant of left breast cancer which showed invasive mammary carcinoma grade 2. Patient underwent chemotherapy along with bilateral mastectomy with reconstruction. Patient came to the emergency room after she was then by PCP office for evaluation of hypoxia. She has noticed lately that her oxygen sats drop in the lower 80s. She had some confusion earlier per family member. Patient also had some headache congestion and chills. She had fever of 100.4 at home.   Patient recently saw pulmonologist at [place]. She has known history of bilateral pulmonary groundless opacities are in her previous CT scan of the chest. Given her shortness of breath hypoxia low-grade fever and CT chest suggestive of persistent ground loss opacity/infiltrated will admit her for acute hypoxic respiratory failure and sepsis given her fever of 100.4, abnormal CT chest and elevated lactate of 2.3  1/31.  Patient feeling better but was interested in going home.  Does qualify for oxygen at home with pulse ox of 85% on room air.  I added Solu-Medrol and will continue IV antibiotics today.  Assessment and Plan: * Acute respiratory failure with hypoxia (HCC) Today unable to come off oxygen.  Likely will need home oxygen.  Her lung issues likely related to prior chemotherapy treatment.  Case discussed with her pulmonologist.  Multifocal pneumonia Sepsis ruled out.  Continue Rocephin and Zithromax.  Sputum culture if able to give a sputum.  Malignant neoplasm of left female breast (HCC) Continue Arimidex.  Follow-up as outpatient.  Hyperlipidemia, unspecified On Crestor  Chronic pain On Lyrica and oxycodone  Tobacco abuse Nicotine patch        Subjective: Patient was actually interested in going  home.  Still has a little bronchospasm in the lungs and I started Solu-Medrol.  Still with some cough.  Physical Exam: Vitals:   11/21/22 1919 11/22/22 0453 11/22/22 0849 11/22/22 1203  BP: 104/78 118/76 (!) 88/66   Pulse: 84 80 84 85  Resp: 16 18 17 18   Temp: 98.2 F (36.8 C) (!) 97.5 F (36.4 C) 99.1 F (37.3 C)   TempSrc: Oral     SpO2: 98% 96% 91% 94%  Weight:      Height:       Physical Exam HENT:     Head: Normocephalic.     Mouth/Throat:     Pharynx: No oropharyngeal exudate.  Eyes:     General: Lids are normal.     Conjunctiva/sclera: Conjunctivae normal.  Cardiovascular:     Rate and Rhythm: Normal rate and regular rhythm.     Heart sounds: Normal heart sounds, S1 normal and S2 normal.  Pulmonary:     Breath sounds: Examination of the right-lower field reveals decreased breath sounds and rhonchi. Examination of the left-lower field reveals decreased breath sounds and rhonchi. Decreased breath sounds and rhonchi present. No wheezing or rales.  Abdominal:     Palpations: Abdomen is soft.     Tenderness: There is no abdominal tenderness.  Musculoskeletal:     Right lower leg: No swelling.     Left lower leg: No swelling.  Skin:    General: Skin is warm.     Findings: No rash.  Neurological:     Mental Status: She is alert and oriented to person, place, and time.  Data Reviewed: Lactic acid 1.0, white blood cell count 10.4, platelet count 159  Family Communication: Spoke with family at the bedside  Disposition: Status is: Inpatient Remains inpatient appropriate because: Starting IV Solu-Medrol.  Patient does qualify for oxygen at home  Planned Discharge Destination: Home    Time spent: 28 minutes  Author: Loletha Grayer, MD 11/22/2022 2:18 PM  For on call review [email or web address].

## 2022-11-22 NOTE — Assessment & Plan Note (Signed)
On Crestor

## 2022-11-22 NOTE — Assessment & Plan Note (Addendum)
Sepsis ruled out.  Patient received Rocephin and Zithromax here in the hospital.  Unable to get a sputum sample with 2 inductions yesterday.  Patient wanted to hold off on bronchoscopy.  Case discussed with pulmonary.  Differential includes drug-induced pneumonitis oncology medication, organizing pneumonia, hypersensitivity pneumonitis, DIP versus infectious.  Few more doses of Omnicef and Zithromax upon going home.  Patient being on 40 mg of prednisone on a daily basis until seen by pulmonary to start taper.  Atovaquone prophylaxis prescribed since she has an allergy to Bactrim.

## 2022-11-22 NOTE — Telephone Encounter (Signed)
Pt is currently in [facility] with respiratory issues per significant other and canceled her POST OP appt for tomorrow.  Will call back to r/s once she is released.

## 2022-11-22 NOTE — TOC Initial Note (Addendum)
Transition of Care [facility]) - Initial/Assessment Note    Patient Details  Name: Tricia Berry MRN: [id number] Date of Birth: 06/18/1971  Transition of Care [facility]) CM/SW Contact:    Candie Chroman, LCSW Phone Number: 11/22/2022, 10:32 AM  Clinical Narrative: CSW met with patient. No supports at bedside. CSW introduced role and explained that discharge planning would be discussed. Per MD, patient wants to discharge today will need home oxygen and nebulizer machine. Sats note and DME orders are pending. Patient does not have an agency preference. Contact [employer] liaison. Patient stated her insurance is changing to [employer]. CSW obtained information from card to give to [place] liaison. No further concerns. CSW encouraged patient to contact CSW as needed. CSW will continue to follow patient for support and facilitate return home today. Her significant other will transport her home.                 11:30 am: [employer] is unable to bill patient's current insurance for one day so patient would either have to pay out of pocket for one month for oxygen and nebulizer or wait until tomorrow and they bill her [employer]. Patient prefers to wait until tomorrow.  Expected Discharge Plan: Home/Self Care Barriers to Discharge: Other (must enter comment) (DME delivery)   Patient Goals and CMS Choice     Choice offered to / list presented to : Patient      Expected Discharge Plan and Services     Post Acute Care Choice: Durable Medical Equipment Living arrangements for the past 2 months: Single Family Home                 DME Arranged: Oxygen, Nebulizer machine DME Agency: [employer] Date DME Agency Contacted: 11/22/22   Representative spoke with at DME Agency: Janett Billow            Prior Living Arrangements/Services Living arrangements for the past 2 months: [place]   Patient language and need for interpreter reviewed:: Yes Do you feel safe going back to the place where you live?:  Yes      Need for Family Participation in Patient Care: Yes (Comment) Care giver support system in place?: Yes (comment)   Criminal Activity/Legal Involvement Pertinent to Current Situation/Hospitalization: No - Comment as needed  Activities of Daily Living Home Assistive Devices/Equipment: None ADL Screening (condition at time of admission) Patient's cognitive ability adequate to safely complete daily activities?: Yes Is the patient deaf or have difficulty hearing?: No Does the patient have difficulty seeing, even when wearing glasses/contacts?: No Does the patient have difficulty concentrating, remembering, or making decisions?: No Patient able to express need for assistance with ADLs?: Yes Does the patient have difficulty dressing or bathing?: No Independently performs ADLs?: Yes (appropriate for developmental age) Does the patient have difficulty walking or climbing stairs?: No Weakness of Legs: Both Weakness of Arms/Hands: Both  Permission Sought/Granted Permission sought to share information with : Facility Art therapist granted to share information with : Yes, Verbal Permission Granted     Permission granted to share info w AGENCY: [employer]        Emotional Assessment Appearance:: Appears stated age Attitude/Demeanor/Rapport: Engaged, Gracious Affect (typically observed): Accepting, Appropriate, Calm, Pleasant Orientation: : Oriented to Self, Oriented to Place, Oriented to  Time, Oriented to Situation Alcohol / Substance Use: Not Applicable Psych Involvement: No (comment)  Admission diagnosis:  Acute respiratory failure with hypoxia ([place]) [J96.01] Acute hypoxemic respiratory failure (HCC) [J96.01] Acute sepsis ([place]) [  A41.9] Patient Active Problem List   Diagnosis Date Noted   Hypoxia 11/21/2022   Acute respiratory failure with hypoxia (HCC) 11/21/2022   Sepsis ([place]) 11/21/2022   Chronic sacroiliac joint pain (Bilateral) 10/03/2022    Class: Chronic    Chronic hip pain (Left) 10/03/2022    Class: Chronic   CAP (community acquired pneumonia) 08/29/2022   SOB (shortness of breath) 08/29/2022   Breast asymmetry following reconstructive surgery 08/15/2022   Lumbosacral facet arthropathy (Multilevel) (Bilateral) 07/05/2022   Lumbar spinal facet joint arthropathy with concurrent effusion (L3-4) (Right) 07/05/2022   Degenerative lumbar rotatory levoscoliosis 07/05/2022   Lumbosacral foraminal stenosis (Bilateral: L3-4) (Left: L4-5, L5-S1) 07/05/2022   Lumbosacral lateral recess stenosis (Left: L4-5) (Bilateral: L5-S1) 07/05/2022   Lumbar nerve root impingement (Bilateral: L3-4, L5-S1) (Left: L4-5) 07/05/2022   Long term prescription benzodiazepine use 07/05/2022   Chronic use of opiate for therapeutic purpose 07/05/2022   Lumbosacral facet syndrome (Bilateral) 07/05/2022   Pain medication agreement signed (12/07/21) 05/29/2022   History of breast cancer (Left) 05/29/2022   History of mastectomy (Bilateral) 05/29/2022   Chronic calf pain (2ry area of Pain) (Bilateral) 05/29/2022   Chronic lower extremity pain (3ry area of Pain) (Bilateral) 05/29/2022   Chronic feet pain (Bilateral) 05/29/2022   Chronic upper extremity pain (4th area of Pain) (Bilateral) (L>R) 05/29/2022   Chronic hand pain (Bilateral) (L>R) 05/29/2022   Chronic generalized pain (5th area of Pain) 05/29/2022   Abnormal MRI, lumbar spine (10/13/2021) 05/29/2022   Chronic pain syndrome 05/28/2022   Pharmacologic therapy 05/28/2022   Disorder of skeletal system 05/28/2022   Problems influencing health status 05/28/2022   Family history of heart disease 11/16/2021   Acquired absence of breast 08/16/2021   B12 deficiency 07/21/2021   Atherosclerosis of aorta ([place]) 07/20/2021   Hepatic steatosis 07/20/2021   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/14/2021   Genetic testing 11/22/2020   Family history of ovarian cancer    Candidal vulvovaginitis 11/01/2020   Chronic low  back pain (1ry area of Pain) (Bilateral) (R>L) w/o sciatica 10/06/2020   Encounter for medical examination to establish care 09/26/2020   Malignant neoplasm of left female breast ([place]) 09/26/2020   Sprain of lumbar region 03/08/2010   INTERNAL HEMORRHOIDS 01/27/2010   Anal fissure 01/27/2010   Osteoarthrosis, 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 [place] [address] Phone: [phone] Fax: [place] [address] [place] [address] Phone: [phone] Fax: [phone]     Social Determinants of Health (SDOH) Social History: SDOH Screenings   Food Insecurity: No Food Insecurity (11/21/2022)  Housing: Low Risk  (11/21/2022)  Transportation Needs: No Transportation Needs (11/21/2022)  Utilities: Not At Risk (11/21/2022)  Depression (PHQ2-9): Low Risk  (11/21/2022)  Tobacco Use: High Risk (11/21/2022)   SDOH Interventions:     Readmission Risk Interventions     No data to display

## 2022-11-22 NOTE — Assessment & Plan Note (Addendum)
Patient set up with home oxygen.  Now has chronic respiratory failure.  Her lung issues likely related to prior chemotherapy treatment.  Case discussed with her pulmonologist.  Pulmonary recommends 40 mg of prednisone on a daily basis upon disposition and a slow taper as outpatient.

## 2022-11-23 ENCOUNTER — Other Ambulatory Visit: Payer: Self-pay

## 2022-11-23 ENCOUNTER — Encounter: Payer: Self-pay | Admitting: Family

## 2022-11-23 ENCOUNTER — Encounter: Payer: Self-pay | Admitting: Oncology

## 2022-11-23 ENCOUNTER — Telehealth: Payer: Self-pay | Admitting: Student in an Organized Health Care Education/Training Program

## 2022-11-23 ENCOUNTER — Encounter: Payer: No Typology Code available for payment source | Admitting: Student

## 2022-11-23 DIAGNOSIS — J9601 Acute respiratory failure with hypoxia: Secondary | ICD-10-CM | POA: Diagnosis not present

## 2022-11-23 DIAGNOSIS — C50012 Malignant neoplasm of nipple and areola, left female breast: Secondary | ICD-10-CM | POA: Diagnosis not present

## 2022-11-23 DIAGNOSIS — J189 Pneumonia, unspecified organism: Secondary | ICD-10-CM | POA: Diagnosis not present

## 2022-11-23 DIAGNOSIS — E785 Hyperlipidemia, unspecified: Secondary | ICD-10-CM | POA: Diagnosis not present

## 2022-11-23 MED ORDER — AZITHROMYCIN 250 MG PO TABS
250.0000 mg | ORAL_TABLET | Freq: Every day | ORAL | 0 refills | Status: DC
  Filled 2022-11-23: qty 2, 2d supply, fill #0

## 2022-11-23 MED ORDER — SODIUM CHLORIDE 0.9 % IV SOLN
2.0000 g | INTRAVENOUS | Status: DC
  Filled 2022-11-23: qty 20

## 2022-11-23 MED ORDER — CEFDINIR 300 MG PO CAPS
300.0000 mg | ORAL_CAPSULE | Freq: Two times a day (BID) | ORAL | 0 refills | Status: AC
  Filled 2022-11-23: qty 4, 2d supply, fill #0

## 2022-11-23 MED ORDER — AZITHROMYCIN 250 MG PO TABS
250.0000 mg | ORAL_TABLET | Freq: Every day | ORAL | Status: DC
  Administered 2022-11-23: 250 mg via ORAL
  Filled 2022-11-23: qty 1

## 2022-11-23 MED ORDER — PREDNISONE 20 MG PO TABS
40.0000 mg | ORAL_TABLET | Freq: Every day | ORAL | 0 refills | Status: DC
  Filled 2022-11-23: qty 60, 30d supply, fill #0

## 2022-11-23 MED ORDER — IPRATROPIUM-ALBUTEROL 0.5-2.5 (3) MG/3ML IN SOLN
3.0000 mL | Freq: Three times a day (TID) | RESPIRATORY_TRACT | Status: DC
  Administered 2022-11-23 – 2022-11-24 (×3): 3 mL via RESPIRATORY_TRACT
  Filled 2022-11-23 (×2): qty 3

## 2022-11-23 MED ORDER — ALBUTEROL SULFATE (2.5 MG/3ML) 0.083% IN NEBU: 2.5000 mg | INHALATION_SOLUTION | RESPIRATORY_TRACT | Status: DC | PRN

## 2022-11-23 MED ORDER — IPRATROPIUM-ALBUTEROL 0.5-2.5 (3) MG/3ML IN SOLN
3.0000 mL | RESPIRATORY_TRACT | Status: DC | PRN
  Administered 2022-11-23: 3 mL via RESPIRATORY_TRACT

## 2022-11-23 MED ORDER — ACETYLCYSTEINE 20 % IN SOLN
4.0000 mL | Freq: Once | RESPIRATORY_TRACT | Status: AC
  Administered 2022-11-23: 4 mL via RESPIRATORY_TRACT
  Filled 2022-11-23: qty 4

## 2022-11-23 MED ORDER — PREDNISONE 10 MG PO TABS
ORAL_TABLET | ORAL | 0 refills | Status: DC
  Filled 2022-11-23: qty 42, 20d supply, fill #0

## 2022-11-23 MED ORDER — ALBUTEROL SULFATE (2.5 MG/3ML) 0.083% IN NEBU
2.5000 mg | INHALATION_SOLUTION | Freq: Four times a day (QID) | RESPIRATORY_TRACT | 12 refills | Status: DC | PRN
  Filled 2022-11-23: qty 360, 30d supply, fill #0

## 2022-11-23 NOTE — Progress Notes (Signed)
Sputum induction completed with Hypertonic Saline, Duoneb, Cupped Hand percussion and Electric Chest Percussor with no success. Will reattempt at next nebulizer treatment

## 2022-11-23 NOTE — Progress Notes (Signed)
Progress Note   Patient: Tricia Berry HMC:[id number] DOB: April 12, 1971 DOA: 11/21/2022     2 DOS: the patient was seen and examined on 11/23/2022   Brief hospital course: 52 y.o. female with medical history significant of left breast cancer which showed invasive mammary carcinoma grade 2. Patient underwent chemotherapy along with bilateral mastectomy with reconstruction. Patient came to the emergency room after she was then by PCP office for evaluation of hypoxia. She has noticed lately that her oxygen sats drop in the lower 80s. She had some confusion earlier per family member. Patient also had some headache congestion and chills. She had fever of 100.4 at home.   Patient recently saw pulmonologist at [facility]. She has known history of bilateral pulmonary groundless opacities are in her previous CT scan of the chest. Given her shortness of breath hypoxia low-grade fever and CT chest suggestive of persistent ground loss opacity/infiltrated will admit her for acute hypoxic respiratory failure and sepsis given her fever of 100.4, abnormal CT chest and elevated lactate of 2.3  1/31.  Patient feeling better but was interested in going home.  Does qualify for oxygen at home with pulse ox of 85% on room air.  I added Solu-Medrol and will continue IV antibiotics today. 2/1.  Patient unable to bring up any sputum even with induction.  Pulmonary would like to get a sputum sample.  Made n.p.o. for bronchoscopy for tomorrow.  Assessment and Plan: * Acute respiratory failure with hypoxia ([place]) Patient set up with home oxygen.  Her lung issues likely related to prior chemotherapy treatment.  Case discussed with her pulmonologist.  Pulmonary recommends 40 mg of prednisone on a daily basis upon disposition and a slow taper as outpatient.  Multifocal pneumonia Sepsis ruled out.  Continue Rocephin and Zithromax.  Unable to get a sputum sample with 2 inductions today.  Will make n.p.o. for bronchoscopy  for tomorrow.  Case discussed with pulmonary.  Differential includes drug-induced pneumonitis oncology medication, organizing pneumonia, hypersensitivity pneumonitis, DIP versus infectious.  Malignant neoplasm of left female breast (HCC) Continue Arimidex.  Follow-up as outpatient.  Hyperlipidemia, unspecified On Crestor  Chronic pain On Lyrica and oxycodone  Tobacco abuse Nicotine patch        Subjective: Patient unable to bring up the sputum despite 2 inductions.  Case discussed with pulmonary and will make n.p.o. after midnight for bronchoscopy tomorrow.  Patient feeling better after steroids being started yesterday.  Physical Exam: Vitals:   11/22/22 2131 11/23/22 0441 11/23/22 0823 11/23/22 0855  BP:  113/60 100/86   Pulse:  (!) 103 82 82  Resp:  20 18 16   Temp:  98.2 F (36.8 C) 97.7 F (36.5 C)   TempSrc:  Oral Oral   SpO2: 100% 94% 99% 97%  Weight:      Height:       Physical Exam HENT:     Head: Normocephalic.     Mouth/Throat:     Pharynx: No oropharyngeal exudate.  Eyes:     General: Lids are normal.     Conjunctiva/sclera: Conjunctivae normal.  Cardiovascular:     Rate and Rhythm: Normal rate and regular rhythm.     Heart sounds: Normal heart sounds, S1 normal and S2 normal.  Pulmonary:     Breath sounds: Examination of the right-middle field reveals wheezing. Examination of the left-middle field reveals wheezing. Examination of the right-lower field reveals decreased breath sounds and rhonchi. Examination of the left-lower field reveals decreased breath sounds and  rhonchi. Decreased breath sounds, wheezing and rhonchi present. No rales.  Abdominal:     Palpations: Abdomen is soft.     Tenderness: There is no abdominal tenderness.  Musculoskeletal:     Right lower leg: No swelling.     Left lower leg: No swelling.  Skin:    General: Skin is warm.     Findings: No rash.  Neurological:     Mental Status: She is alert and oriented to person, place,  and time.     Data Reviewed: Viral respiratory panel negative  Family Communication: Spoke with family  Disposition: Status is: Inpatient Remains inpatient appropriate because: Pulmonary wants to do bronchoscopy while here since she was unable to bring up the sputum  Planned Discharge Destination: Home    Time spent: 37 minutes  Author: Loletha Grayer, MD 11/23/2022 4:24 PM  For on call review [email or web address].

## 2022-11-23 NOTE — TOC Transition Note (Signed)
Transition of Care [facility]) - CM/SW Discharge Note   Patient Details  Name: Tricia Berry MRN: [id number] Date of Birth: Dec 20, 1970  [facility] CM/SW Contact:  Candie Chroman, LCSW Phone Number: 11/23/2022, 9:17 AM   Clinical Narrative:   Patient has orders to discharge home today. Notified [employer] liaison so they can start insurance process. Sent secure chat to RN to let her know patient should be okay to leave once oxygen and nebulizer machine are delivered to the room. No further concerns. CSW signing off.  Final next level of care: Home/Self Care Barriers to Discharge: Barriers Resolved   Patient Goals and CMS Choice   Choice offered to / list presented to : Patient  Discharge Placement                  Patient to be transferred to facility by: Significant other   Patient and family notified of of transfer: 11/23/22  Discharge Plan and Services Additional resources added to the After Visit Summary for       Post Acute Care Choice: Durable Medical Equipment          DME Arranged: Oxygen, Nebulizer machine DME Agency: [employer] Date DME Agency Contacted: 11/23/22   Representative spoke with at DME Agency: [place] Determinants of Health ([place]) Interventions SDOH Screenings   Food Insecurity: No Food Insecurity (11/21/2022)  Housing: Low Risk  (11/21/2022)  Transportation Needs: No Transportation Needs (11/21/2022)  Utilities: Not At Risk (11/21/2022)  Depression (PHQ2-9): Low Risk  (11/21/2022)  Tobacco Use: High Risk (11/21/2022)     Readmission Risk Interventions     No data to display

## 2022-11-23 NOTE — Telephone Encounter (Signed)
Dr. Genia Harold order CT chest on 11/17/22 the patient has been admitted to hospital and had CTA done 11/21/22 will she still need Chest CT scheduled on 11/27/22

## 2022-11-24 ENCOUNTER — Other Ambulatory Visit (HOSPITAL_COMMUNITY): Payer: Self-pay

## 2022-11-24 ENCOUNTER — Encounter: Payer: Self-pay | Admitting: Oncology

## 2022-11-24 ENCOUNTER — Other Ambulatory Visit: Payer: Self-pay

## 2022-11-24 ENCOUNTER — Ambulatory Visit: Payer: 59

## 2022-11-24 ENCOUNTER — Other Ambulatory Visit: Payer: Self-pay | Admitting: Family

## 2022-11-24 ENCOUNTER — Inpatient Hospital Stay: Payer: Commercial Managed Care - HMO

## 2022-11-24 ENCOUNTER — Telehealth: Payer: Self-pay | Admitting: Student in an Organized Health Care Education/Training Program

## 2022-11-24 DIAGNOSIS — F411 Generalized anxiety disorder: Secondary | ICD-10-CM

## 2022-11-24 DIAGNOSIS — R7301 Impaired fasting glucose: Secondary | ICD-10-CM | POA: Insufficient documentation

## 2022-11-24 DIAGNOSIS — C50012 Malignant neoplasm of nipple and areola, left female breast: Secondary | ICD-10-CM | POA: Diagnosis not present

## 2022-11-24 DIAGNOSIS — E785 Hyperlipidemia, unspecified: Secondary | ICD-10-CM | POA: Diagnosis not present

## 2022-11-24 DIAGNOSIS — J9621 Acute and chronic respiratory failure with hypoxia: Secondary | ICD-10-CM | POA: Diagnosis not present

## 2022-11-24 DIAGNOSIS — J189 Pneumonia, unspecified organism: Secondary | ICD-10-CM | POA: Diagnosis not present

## 2022-11-24 DIAGNOSIS — B370 Candidal stomatitis: Secondary | ICD-10-CM | POA: Insufficient documentation

## 2022-11-24 LAB — EXPECTORATED SPUTUM ASSESSMENT W GRAM STAIN, RFLX TO RESP C

## 2022-11-24 LAB — CBC
HCT: 36 % (ref 36.0–46.0)
Hemoglobin: 11.5 g/dL — ABNORMAL LOW (ref 12.0–15.0)
MCH: 30 pg (ref 26.0–34.0)
MCHC: 31.9 g/dL (ref 30.0–36.0)
MCV: 94 fL (ref 80.0–100.0)
Platelets: 180 10*3/uL (ref 150–400)
RBC: 3.83 MIL/uL — ABNORMAL LOW (ref 3.87–5.11)
RDW: 14.7 % (ref 11.5–15.5)
WBC: 13 10*3/uL — ABNORMAL HIGH (ref 4.0–10.5)
nRBC: 0 % (ref 0.0–0.2)

## 2022-11-24 LAB — BASIC METABOLIC PANEL
Anion gap: 6 (ref 5–15)
BUN: 10 mg/dL (ref 6–20)
CO2: 27 mmol/L (ref 22–32)
Calcium: 8.8 mg/dL — ABNORMAL LOW (ref 8.9–10.3)
Chloride: 106 mmol/L (ref 98–111)
Creatinine, Ser: 0.63 mg/dL (ref 0.44–1.00)
GFR, Estimated: >60 mL/min (ref >60)
Glucose, Bld: 193 mg/dL — ABNORMAL HIGH (ref 70–99)
Potassium: 4.4 mmol/L (ref 3.5–5.1)
Sodium: 139 mmol/L (ref 135–145)

## 2022-11-24 LAB — HEMOGLOBIN A1C
Hgb A1c MFr Bld: 5.6 % (ref 4.8–5.6)
Mean Plasma Glucose: 114.02 mg/dL

## 2022-11-24 LAB — PROTIME-INR
INR: 1.1 (ref 0.8–1.2)
Prothrombin Time: 14 seconds (ref 11.4–15.2)

## 2022-11-24 LAB — CRYPTOCOCCUS ANTIGEN, SERUM: Cryptococcus Antigen, Serum: NEGATIVE

## 2022-11-24 MED ORDER — FLUCONAZOLE 100 MG PO TABS: 100.0000 mg | ORAL_TABLET | Freq: Every day | ORAL | Status: DC

## 2022-11-24 MED ORDER — CEFDINIR 300 MG PO CAPS
300.0000 mg | ORAL_CAPSULE | Freq: Two times a day (BID) | ORAL | Status: DC
  Administered 2022-11-24: 300 mg via ORAL
  Filled 2022-11-24: qty 1

## 2022-11-24 MED ORDER — FLUCONAZOLE 100 MG PO TABS
200.0000 mg | ORAL_TABLET | Freq: Once | ORAL | Status: AC
  Administered 2022-11-24: 200 mg via ORAL
  Filled 2022-11-24: qty 2

## 2022-11-24 MED ORDER — ATOVAQUONE 750 MG/5ML PO SUSP
1500.0000 mg | Freq: Every day | ORAL | 0 refills | Status: DC
  Filled 2022-11-24: qty 125, 12d supply, fill #0
  Filled 2022-11-24: qty 175, 18d supply, fill #0

## 2022-11-24 MED ORDER — FLUCONAZOLE 100 MG PO TABS
100.0000 mg | ORAL_TABLET | Freq: Every day | ORAL | 0 refills | Status: DC
  Filled 2022-11-24: qty 6, 6d supply, fill #0

## 2022-11-24 MED ORDER — LORAZEPAM 0.5 MG PO TABS
0.5000 mg | ORAL_TABLET | Freq: Two times a day (BID) | ORAL | 2 refills | Status: DC | PRN
  Filled 2022-11-24 (×2): qty 60, 30d supply, fill #0
  Filled 2022-12-18 – 2022-12-22 (×2): qty 60, 30d supply, fill #1
  Filled 2023-01-21: qty 60, 30d supply, fill #2

## 2022-11-24 NOTE — Plan of Care (Signed)
Patient AOX4, VSS throughout shift.  All meds given on time as ordered Pt c/o pain relieved by PRN oxycodone.  Diminished lungs, IS taught and encouraged.  Pt showered last night and remains NPO since 12am.  POC maintained, will continue to monitor.  Problem: Education: Goal: Knowledge of General Education information will improve Description: Including pain rating scale, medication(s)/side effects and non-pharmacologic comfort measures Outcome: Progressing   Problem: Health Behavior/Discharge Planning: Goal: Ability to manage health-related needs will improve Outcome: Progressing   Problem: Clinical Measurements: Goal: Ability to maintain clinical measurements within normal limits will improve Outcome: Progressing Goal: Will remain free from infection Outcome: Progressing Goal: Diagnostic test results will improve Outcome: Progressing Goal: Respiratory complications will improve Outcome: Progressing Goal: Cardiovascular complication will be avoided Outcome: Progressing   Problem: Activity: Goal: Risk for activity intolerance will decrease Outcome: Progressing   Problem: Nutrition: Goal: Adequate nutrition will be maintained Outcome: Progressing   Problem: Coping: Goal: Level of anxiety will decrease Outcome: Progressing   Problem: Elimination: Goal: Will not experience complications related to bowel motility Outcome: Progressing Goal: Will not experience complications related to urinary retention Outcome: Progressing   Problem: Pain Managment: Goal: General experience of comfort will improve Outcome: Progressing   Problem: Safety: Goal: Ability to remain free from injury will improve Outcome: Progressing   Problem: Skin Integrity: Goal: Risk for impaired skin integrity will decrease Outcome: Progressing

## 2022-11-24 NOTE — TOC Transition Note (Signed)
Transition of Care [facility]) - CM/SW Discharge Note   Patient Details  Name: Tricia Berry MRN: [id number] Date of Birth: [birth year]  [facility]) CM/SW Contact:  Candie Chroman, LCSW Phone Number: 11/24/2022, 10:14 AM   Clinical Narrative:  Patient has orders to discharge home today. DME was delivered yesterday. No further concerns. CSW signing off.   Final next level of care: Home/Self Care Barriers to Discharge: Barriers Resolved   Patient Goals and CMS Choice   Choice offered to / list presented to : Patient  Discharge Placement                  Patient to be transferred to facility by: Significant other   Patient and family notified of of transfer: 11/24/22  Discharge Plan and Services Additional resources added to the After Visit Summary for       Post Acute Care Choice: Durable Medical Equipment          DME Arranged: Oxygen, Nebulizer machine DME Agency: [employer] Date DME Agency Contacted: 11/23/22   Representative spoke with at DME Agency: [place] Determinants of Health ([place]) Interventions SDOH Screenings   Food Insecurity: No Food Insecurity (11/21/2022)  Housing: Low Risk  (11/21/2022)  Transportation Needs: No Transportation Needs (11/21/2022)  Utilities: Not At Risk (11/21/2022)  Depression (PHQ2-9): Low Risk  (11/21/2022)  Tobacco Use: High Risk (11/21/2022)     Readmission Risk Interventions     No data to display

## 2022-11-24 NOTE — Telephone Encounter (Signed)
PT just released from hospital. Needs order for Humidification sent to [employer] and POC order as well. They have already been out to her home. She is using the [employer] office. Please send order and call PT to advise of status. [phone]. Msg OK she said. She sees him next week.

## 2022-11-24 NOTE — Assessment & Plan Note (Signed)
Secondary to steroids, hemoglobin A1c 5.6.  Not a diabetic at this point.

## 2022-11-24 NOTE — Progress Notes (Signed)
I looked up patient on [place] Controlled Substances Reporting System PMP AWARE and saw no activity that raised concern of inappropriate use.   

## 2022-11-24 NOTE — Discharge Summary (Signed)
Physician Discharge Summary   Patient: Tricia Berry MRN: [id number] DOB: [birth year]  Admit date:     11/21/2022  Discharge date: 11/24/22  Discharge Physician: Loletha Grayer   PCP: Burnard Hawthorne, FNP   Recommendations at discharge:   Follow-up PCP 5 days Follow-up pulmonary Dr. Genia Harold  Discharge Diagnoses: Principal Problem:   Acute on chronic respiratory failure with hypoxia [facility]) Active Problems:   Multifocal pneumonia   Malignant neoplasm of left female breast (HCC)   Hyperlipidemia, unspecified   Chronic pain   Tobacco abuse   Thrush   Impaired fasting glucose    Hospital Course: 52 y.o. female with medical history significant of left breast cancer which showed invasive mammary carcinoma grade 2. Patient underwent chemotherapy along with bilateral mastectomy with reconstruction. Patient came to the emergency room after she was then by PCP office for evaluation of hypoxia. She has noticed lately that her oxygen sats drop in the lower 80s. She had some confusion earlier per family member. Patient also had some headache congestion and chills. She had fever of 100.4 at home.   Patient recently saw pulmonologist at [place]. She has known history of bilateral pulmonary groundless opacities are in her previous CT scan of the chest. Given her shortness of breath hypoxia low-grade fever and CT chest suggestive of persistent ground loss opacity/infiltrated will admit her for acute hypoxic respiratory failure and sepsis given her fever of 100.4, abnormal CT chest and elevated lactate of 2.3  1/31.  Patient feeling better but was interested in going home.  Does qualify for oxygen at home with pulse ox of 85% on room air.  I added Solu-Medrol and will continue IV antibiotics today. 2/1.  Patient unable to bring up any sputum even with induction.  Pulmonary would like to get a sputum sample.  Made n.p.o. for bronchoscopy for tomorrow. 2/2.  Patient spoke with pulmonary  Dr. Genia Harold and wanted to hold off on bronchoscopy.  Pulmonary wanted to do 40 mg of prednisone daily until seen as outpatient.  Patient will go home on oxygen.  Assessment and Plan: * Acute on chronic respiratory failure with hypoxia [facility]) Patient set up with home oxygen.  Now has chronic respiratory failure.  Her lung issues likely related to prior chemotherapy treatment.  Case discussed with her pulmonologist.  Pulmonary recommends 40 mg of prednisone on a daily basis upon disposition and a slow taper as outpatient.  Multifocal pneumonia Sepsis ruled out.  Patient received Rocephin and Zithromax here in the hospital.  Unable to get a sputum sample with 2 inductions yesterday.  Patient wanted to hold off on bronchoscopy.  Case discussed with pulmonary.  Differential includes drug-induced pneumonitis oncology medication, organizing pneumonia, hypersensitivity pneumonitis, DIP versus infectious.  Few more doses of Omnicef and Zithromax upon going home.  Patient being on 40 mg of prednisone on a daily basis until seen by pulmonary to start taper.  Atovaquone prophylaxis prescribed since she has an allergy to Bactrim.  Malignant neoplasm of left female breast (HCC) Continue Arimidex.  Follow-up as outpatient.  Hyperlipidemia, unspecified On Crestor  Chronic pain On Lyrica and oxycodone  Tobacco abuse Nicotine patch  Impaired fasting glucose Secondary to steroids, hemoglobin A1c 5.6.  Not a diabetic at this point.  Thrush Diflucan prescribed         Consultants: Pulmonary Procedures performed: None Disposition: Home health Diet recommendation:  Regular diet DISCHARGE MEDICATION: Allergies as of 11/24/2022       Reactions  Morphine Nausea And Vomiting, Other (See Comments)   migranes Other reaction(s): Headache Migraine, vomiting   Sertraline Hcl    REACTION: Worsened symptoms of IBS   Sulfa Antibiotics Rash   Sulfamethoxazole Rash   Sulfonamide Derivatives Rash         Medication List     TAKE these medications    albuterol 108 (90 Base) MCG/ACT inhaler Commonly known as: VENTOLIN HFA Inhale 2 puffs into the lungs every 6 (six) hours as needed for wheezing or shortness of breath. What changed: Another medication with the same name was added. Make sure you understand how and when to take each.   albuterol (2.5 MG/3ML) 0.083% nebulizer solution Commonly known as: PROVENTIL Take 3 mLs (2.5 mg total) by nebulization every 6 (six) hours as needed for wheezing or shortness of breath. What changed: You were already taking a medication with the same name, and this prescription was added. Make sure you understand how and when to take each.   anastrozole 1 MG tablet Commonly known as: ARIMIDEX Take 1 tablet (1 mg total) by mouth daily.   atovaquone 750 MG/5ML suspension Commonly known as: MEPRON Take 10 mLs (1,500 mg total) by mouth daily. Start taking on: November 27, 2022   azithromycin 250 MG tablet Commonly known as: ZITHROMAX Take 1 tablet (250 mg total) by mouth daily for 2 days.   baclofen 10 MG tablet Commonly known as: LIORESAL Take 1 tablet (10 mg total) by mouth 3 (three) times daily as needed for muscle spasms.   Breztri Aerosphere 160-9-4.8 MCG/ACT Aero Generic drug: Budeson-Glycopyrrol-Formoterol Inhale 2 puffs into the lungs 2 (two) times daily.   cefdinir 300 MG capsule Commonly known as: OMNICEF Take 1 capsule (300 mg total) by mouth 2 (two) times daily for 2 days.   cyanocobalamin 1000 MCG/ML injection Commonly known as: VITAMIN B12 Inject 1 mL into the muscle once monthly (1000 mcg (1 mL) intramuscular injection in the thigh ( vastus lateralis) once per month.)   fluconazole 100 MG tablet Commonly known as: DIFLUCAN Take 1 tablet (100 mg total) by mouth daily. Start taking on: November 25, 2022   FLUoxetine 20 MG capsule Commonly known as: PROZAC Take 1 capsule (20 mg total) by mouth every morning.   LORazepam 0.5 MG  tablet Commonly known as: ATIVAN Take 1 tablet (0.5 mg total) by mouth 2 (two) times daily as needed for anxiety. What changed:  when to take this reasons to take this   MULTIVITAL PO Take 1 Dose by mouth daily.   naloxone 4 MG/0.1ML Liqd nasal spray kit Commonly known as: NARCAN Place 1 spray into the nose as needed for opioid-induced respiratory depresssion. In case of emergency (overdose), spray once into each nostril. If no response within 3 minutes, repeat application and call [phone].   nicotine 21 mg/24hr patch Commonly known as: NICODERM CQ - dosed in mg/24 hours Apply one patch to skin once daily (use 1 patch daily)   oxyCODONE 5 MG immediate release tablet Commonly known as: Oxy IR/ROXICODONE Take 1 tablet (5 mg total) by mouth 2 (two) times daily. Must last 30 days.   predniSONE 20 MG tablet Commonly known as: DELTASONE Take 2 tablets (40 mg total) by mouth daily until seen by pulmonary to start taper   pregabalin 150 MG capsule Commonly known as: LYRICA Take 1 capsule (150 mg total) by mouth in the morning, at noon, and at bedtime.   PROBIOTIC ACIDOPHILUS PO Take 1 capsule by mouth daily.  rosuvastatin 5 MG tablet Commonly known as: CRESTOR Take 5 mg by mouth daily.   trimethoprim 100 MG tablet Commonly known as: TRIMPEX Take 1 tablet (100 mg total) by mouth daily.        Follow-up Information     Burnard Hawthorne, FNP Follow up in 5 day(s).   Specialty: Family Medicine Contact information: [place] [address] [place] [address] [phone]         Armando Reichert, MD Follow up.   Specialty: Pulmonary Disease Why: keep appointment Contact information: [place] [address] [phone]                Discharge Exam: Danley Danker Weights   11/21/22 1337 11/21/22 1601  Weight: 57.5 kg 56.7 kg   Physical Exam HENT:     Head: Normocephalic.     Mouth/Throat:     Pharynx: No oropharyngeal exudate.   Eyes:     General: Lids are normal.     Conjunctiva/sclera: Conjunctivae normal.  Cardiovascular:     Rate and Rhythm: Normal rate and regular rhythm.     Heart sounds: Normal heart sounds, S1 normal and S2 normal.  Pulmonary:     Breath sounds: Examination of the right-lower field reveals decreased breath sounds and rhonchi. Examination of the left-lower field reveals decreased breath sounds and rhonchi. Decreased breath sounds and rhonchi present. No wheezing or rales.  Abdominal:     Palpations: Abdomen is soft.     Tenderness: There is no abdominal tenderness.  Musculoskeletal:     Right lower leg: No swelling.     Left lower leg: No swelling.  Skin:    General: Skin is warm.     Findings: No rash.  Neurological:     Mental Status: She is alert and oriented to person, place, and time.      Condition at discharge: stable  The results of significant diagnostics from this hospitalization (including imaging, microbiology, ancillary and laboratory) are listed below for reference.   Imaging Studies: CT Angio Chest PE W and/or Wo Contrast  Result Date: 11/21/2022 CLINICAL DATA:  Headache with cough, shortness of breath and chills. EXAM: CT ANGIOGRAPHY CHEST WITH CONTRAST TECHNIQUE: Multidetector CT imaging of the chest was performed using the standard protocol during bolus administration of intravenous contrast. Multiplanar CT image reconstructions and MIPs were obtained to evaluate the vascular anatomy. RADIATION DOSE REDUCTION: This exam was performed according to the departmental dose-optimization program which includes automated exposure control, adjustment of the mA and/or kV according to patient size and/or use of iterative reconstruction technique. CONTRAST:  58mL OMNIPAQUE IOHEXOL 350 MG/ML SOLN COMPARISON:  August 30, 2022 FINDINGS: Cardiovascular: The thoracic aorta is normal in appearance. Satisfactory opacification of the pulmonary arteries to the segmental level. No evidence  of pulmonary embolism. Normal heart size. No pericardial effusion. Mediastinum/Nodes: A predominantly stable 14 mm precarinal lymph node is seen. Thyroid gland, trachea, and esophagus demonstrate no significant findings. Lungs/Pleura: Bilateral, moderate severity multifocal infiltrates are seen. There is no evidence of a pleural effusion or pneumothorax. Upper Abdomen: No acute abnormality. Musculoskeletal: Bilateral breast implants are seen. No acute osseous abnormalities are identified. Review of the MIP images confirms the above findings. IMPRESSION: 1. No evidence of pulmonary embolism. 2. Bilateral, moderate severity multifocal infiltrates. Electronically Signed   By: Virgina Norfolk M.D.   On: 11/21/2022 17:10   CT HEAD WO CONTRAST (5MM)  Result Date: 11/21/2022 CLINICAL DATA:  Headache with fever, shortness  of breath and chills. EXAM: CT HEAD WITHOUT CONTRAST TECHNIQUE: Contiguous axial images were obtained from the base of the skull through the vertex without intravenous contrast. RADIATION DOSE REDUCTION: This exam was performed according to the departmental dose-optimization program which includes automated exposure control, adjustment of the mA and/or kV according to patient size and/or use of iterative reconstruction technique. COMPARISON:  None Available. FINDINGS: Brain: No evidence of acute infarction, hemorrhage, hydrocephalus, extra-axial collection or mass lesion/mass effect. Vascular: No hyperdense vessel or unexpected calcification. Skull: Normal. Negative for fracture or focal lesion. Sinuses/Orbits: No acute finding. Other: None. IMPRESSION: No acute intracranial pathology. Electronically Signed   By: Virgina Norfolk M.D.   On: 11/21/2022 17:03   DG Chest 2 View  Result Date: 11/21/2022 CLINICAL DATA:  Cough and SOB EXAM: CHEST - 2 VIEW COMPARISON:  10/10/22. FINDINGS: The heart size and mediastinal contours are within normal limits. Patchy bilateral lung opacities again seen.  Slightly increased at the right lung base. The visualized skeletal structures are unremarkable. Surgical changes along the left axilla. Curvature of the spine IMPRESSION: Increasing patchy lung opacities. Electronically Signed   By: Jill Side M.D.   On: 11/21/2022 14:25    Microbiology: Results for orders placed or performed during the hospital encounter of 11/21/22  Resp panel by RT-PCR (RSV, Flu A&B, Covid) Anterior Nasal Swab     Status: None   Collection Time: 11/21/22  3:22 PM   Specimen: Anterior Nasal Swab  Result Value Ref Range Status   SARS Coronavirus 2 by RT PCR NEGATIVE NEGATIVE Final    Comment: (NOTE) SARS-CoV-2 target nucleic acids are NOT DETECTED.  The SARS-CoV-2 RNA is generally detectable in upper respiratory specimens during the acute phase of infection. The lowest concentration of SARS-CoV-2 viral copies this assay can detect is 138 copies/mL. A negative result does not preclude SARS-Cov-2 infection and should not be used as the sole basis for treatment or other patient management decisions. A negative result may occur with  improper specimen collection/handling, submission of specimen other than nasopharyngeal swab, presence of viral mutation(s) within the areas targeted by this assay, and inadequate number of viral copies(<138 copies/mL). A negative result must be combined with clinical observations, patient history, and epidemiological information. The expected result is Negative.  Fact Sheet for Patients:  EntrepreneurPulse.com.au  Fact Sheet for Healthcare Providers:  IncredibleEmployment.be  This test is no t yet approved or cleared by the [place] FDA and  has been authorized for detection and/or diagnosis of SARS-CoV-2 by FDA under an Emergency Use Authorization (EUA). This EUA will remain  in effect (meaning this test can be used) for the duration of the COVID-19 declaration under Section 564(b)(1) of the  Act, 21 U.S.C.section 360bbb-3(b)(1), unless the authorization is terminated  or revoked sooner.       Influenza A by PCR NEGATIVE NEGATIVE Final   Influenza B by PCR NEGATIVE NEGATIVE Final    Comment: (NOTE) The Xpert Xpress SARS-CoV-2/FLU/RSV plus assay is intended as an aid in the diagnosis of influenza from Nasopharyngeal swab specimens and should not be used as a sole basis for treatment. Nasal washings and aspirates are unacceptable for Xpert Xpress SARS-CoV-2/FLU/RSV testing.  Fact Sheet for Patients: EntrepreneurPulse.com.au  Fact Sheet for Healthcare Providers: IncredibleEmployment.be  This test is not yet approved or cleared by the [place] FDA and has been authorized for detection and/or diagnosis of SARS-CoV-2 by FDA under an Emergency Use Authorization (EUA). This EUA will remain in effect (meaning this  test can be used) for the duration of the COVID-19 declaration under Section 564(b)(1) of the Act, 21 U.S.C. section 360bbb-3(b)(1), unless the authorization is terminated or revoked.     Resp Syncytial Virus by PCR NEGATIVE NEGATIVE Final    Comment: (NOTE) Fact Sheet for Patients: EntrepreneurPulse.com.au  Fact Sheet for Healthcare Providers: IncredibleEmployment.be  This test is not yet approved or cleared by the [place] FDA and has been authorized for detection and/or diagnosis of SARS-CoV-2 by FDA under an Emergency Use Authorization (EUA). This EUA will remain in effect (meaning this test can be used) for the duration of the COVID-19 declaration under Section 564(b)(1) of the Act, 21 U.S.C. section 360bbb-3(b)(1), unless the authorization is terminated or revoked.  Performed at [facility], [address] [place], [place] [address]   Blood culture (routine x 2)     Status: None (Preliminary result)   Collection Time: 11/21/22  3:22 PM   Specimen: BLOOD   Result Value Ref Range Status   Specimen Description BLOOD BLOOD RIGHT ARM  Final   Special Requests   Final    BOTTLES DRAWN AEROBIC AND ANAEROBIC Blood Culture adequate volume   Culture   Final    NO GROWTH 3 DAYS Performed at [facility], [address], [place], [place] [address]    Report Status PENDING  Incomplete  Blood culture (routine x 2)     Status: None (Preliminary result)   Collection Time: 11/21/22  3:22 PM   Specimen: BLOOD  Result Value Ref Range Status   Specimen Description BLOOD BLOOD RIGHT ARM  Final   Special Requests   Final    BOTTLES DRAWN AEROBIC AND ANAEROBIC Blood Culture adequate volume   Culture   Final    NO GROWTH 3 DAYS Performed at [facility], [address], [place], [place] [address]    Report Status PENDING  Incomplete  Expectorated Sputum Assessment w Gram Stain, Rflx to Resp Cult     Status: None   Collection Time: 11/22/22 11:05 AM   Specimen: Sputum  Result Value Ref Range Status   Specimen Description SPUTUM  Final   Special Requests Immunocompromised  Final   Sputum evaluation   Final    THIS SPECIMEN IS ACCEPTABLE FOR SPUTUM CULTURE Performed at [facility], [address] [place], [place] [address]    Report Status 11/24/2022 FINAL  Final  Respiratory (~20 pathogens) panel by PCR     Status: None   Collection Time: 11/22/22  7:00 PM   Specimen: Nasopharyngeal Swab; Respiratory  Result Value Ref Range Status   Adenovirus NOT DETECTED NOT DETECTED Final   Coronavirus 229E NOT DETECTED NOT DETECTED Final    Comment: (NOTE) The Coronavirus on the Respiratory Panel, DOES NOT test for the novel  Coronavirus (2019 nCoV)    Coronavirus HKU1 NOT DETECTED NOT DETECTED Final   Coronavirus NL63 NOT DETECTED NOT DETECTED Final   Coronavirus OC43 NOT DETECTED NOT DETECTED Final   Metapneumovirus NOT DETECTED NOT DETECTED Final   Rhinovirus / Enterovirus NOT DETECTED NOT DETECTED Final   Influenza A NOT  DETECTED NOT DETECTED Final   Influenza B NOT DETECTED NOT DETECTED Final   Parainfluenza Virus 1 NOT DETECTED NOT DETECTED Final   Parainfluenza Virus 2 NOT DETECTED NOT DETECTED Final   Parainfluenza Virus 3 NOT DETECTED NOT DETECTED Final   Parainfluenza Virus 4 NOT DETECTED NOT DETECTED Final   Respiratory Syncytial Virus NOT DETECTED NOT DETECTED Final  Bordetella pertussis NOT DETECTED NOT DETECTED Final   Bordetella Parapertussis NOT DETECTED NOT DETECTED Final   Chlamydophila pneumoniae NOT DETECTED NOT DETECTED Final   Mycoplasma pneumoniae NOT DETECTED NOT DETECTED Final    Comment: Performed at [facility], [place] [address] [place], [place] [address]   *Note: Due to a large number of results and/or encounters for the requested time period, some results have not been displayed. A complete set of results can be found in Results Review.    Labs: CBC: Recent Labs  Lab 11/21/22 1341 11/21/22 1342 11/24/22 0212  WBC 10.4 10.4 13.0*  NEUTROABS 7.3  --   --   HGB 12.7 12.7 11.5*  HCT 39.9 39.8 36.0  MCV 96.1 95.7 94.0  PLT 165 159 469   Basic Metabolic Panel: Recent Labs  Lab 11/21/22 1342 11/24/22 0212  NA 135 139  K 3.9 4.4  CL 102 106  CO2 27 27  GLUCOSE 117* 193*  BUN 7 10  CREATININE 0.92 0.63  CALCIUM 9.0 8.8*   Liver Function Tests: Recent Labs  Lab 11/21/22 1342  AST 44*  ALT 41  ALKPHOS 66  BILITOT 0.7  PROT 7.2  ALBUMIN 3.8   CBG: No results for input(s): "GLUCAP" in the last 168 hours.  Discharge time spent: greater than 30 minutes.  Signed: Loletha Grayer, MD [facility] 11/24/2022

## 2022-11-24 NOTE — Progress Notes (Signed)
Discharge instructions reviewed with patient including followup visits and new medications/medication changes.  Understanding was verbalized and all questions were answered.  IV removed without complication; patient tolerated well.  Patient discharged home via wheelchair in stable condition escorted by nursing staff.

## 2022-11-24 NOTE — Telephone Encounter (Signed)
I spoke with the patient. I told her we can not sent a referral for a POC until we have seen her in the office and qualified her for it. She said the hospital qualified her for the POC but they have not sent over the order yet. I told her to try and reach out to the social worker that was working on the referral to see why it has not been sent over yet.  She will try and contact the Education officer, museum. She said if she can not get in touch with her she will wait until her appointment on 11/29/2022 with Dr. Genia Harold to be qualified for it.   Nothing further needed.

## 2022-11-24 NOTE — TOC Benefit Eligibility Note (Signed)
Patient Teacher, English as a foreign language completed.    The patient is currently admitted and upon discharge could be taking atovaquone (Mepron) 750 mg/5 ml.  The current 30 day co-pay is $27.80.   The patient is currently admitted and upon discharge could be taking dapsone 100 mg tablets.  The current 30 day co-pay is $60.00.   The patient is insured through [place], [place] Patient Advocate Specialist [place] Patient Advocate Team Direct Number: [phone]  Fax: [phone]

## 2022-11-24 NOTE — Telephone Encounter (Signed)
I have CXL the CT that was scheduled on 11/27/22. Patient is still in the hospital and I haven't let her know our CT has been CXL. She is also scheduled to have MRI of the Brain same day

## 2022-11-24 NOTE — Assessment & Plan Note (Signed)
Diflucan prescribed

## 2022-11-25 LAB — ASPERGILLUS ANTIGEN, BAL/SERUM: Aspergillus Ag, BAL/Serum: 0.06 Index (ref 0.00–0.49)

## 2022-11-26 LAB — CULTURE, BLOOD (ROUTINE X 2)
Culture: NO GROWTH
Culture: NO GROWTH
Special Requests: ADEQUATE
Special Requests: ADEQUATE

## 2022-11-27 ENCOUNTER — Telehealth: Payer: Self-pay

## 2022-11-27 ENCOUNTER — Ambulatory Visit: Payer: Commercial Managed Care - HMO

## 2022-11-27 ENCOUNTER — Ambulatory Visit
Admission: RE | Admit: 2022-11-27 | Discharge: 2022-11-27 | Disposition: A | Discharge status: Home / Self Care | Payer: Commercial Managed Care - HMO | Source: Ambulatory Visit | Attending: Internal Medicine | Admitting: Internal Medicine

## 2022-11-27 ENCOUNTER — Encounter: Payer: Self-pay | Admitting: Oncology

## 2022-11-27 DIAGNOSIS — C7931 Secondary malignant neoplasm of brain: Secondary | ICD-10-CM | POA: Diagnosis present

## 2022-11-27 LAB — CULTURE, RESPIRATORY W GRAM STAIN

## 2022-11-27 LAB — MISC LABCORP TEST (SEND OUT): LabCorp test name: 180232

## 2022-11-27 MED ORDER — GADOBUTROL 1 MMOL/ML IV SOLN
6.0000 mL | Freq: Once | INTRAVENOUS | Status: AC | PRN
  Administered 2022-11-27: 6 mL via INTRAVENOUS

## 2022-11-27 NOTE — Telephone Encounter (Signed)
Transition Care Management Follow-up Telephone Call Date of discharge and from where: [place] 11/24/2022 How have you been since you were released from the hospital? better Any questions or concerns? No  Items Reviewed: Did the pt receive and understand the discharge instructions provided? Yes  Medications obtained and verified? Yes  Other? No  Any new allergies since your discharge? No  Dietary orders reviewed? Yes Do you have support at home? Yes   Home Care and Equipment/Supplies: Were home health services ordered? no If so, what is the name of the agency? N/a  Has the agency set up a time to come to the patient's home? no Were any new equipment or medical supplies ordered?  Yes: O2 What is the name of the medical supply agency? [employer] Were you able to get the supplies/equipment? yes Do you have any questions related to the use of the equipment or supplies? No  Functional Questionnaire: (I = Independent and D = Dependent) ADLs: I  Bathing/Dressing- I  Meal Prep- I  Eating- I  Maintaining continence- I  Transferring/Ambulation- I  Managing Meds- I  Follow up appointments reviewed:  PCP Hospital f/u appt confirmed? Yes  Scheduled to see Mable Paris on 12/04/2022 @ 11:30. [facility] f/u appt confirmed? Yes   Are transportation arrangements needed? No  If their condition worsens, is the pt aware to call PCP or go to the Emergency Dept.? Yes Was the patient provided with contact information for the PCP's office or ED? Yes Was to pt encouraged to call back with questions or concerns? Yes Juanda Crumble, LPN [place] Direct Dial [phone]

## 2022-11-28 LAB — MISC LABCORP TEST (SEND OUT): Labcorp test code: 832599

## 2022-11-29 ENCOUNTER — Telehealth: Payer: Self-pay | Admitting: *Deleted

## 2022-11-29 ENCOUNTER — Ambulatory Visit: Payer: Commercial Managed Care - HMO | Admitting: Student in an Organized Health Care Education/Training Program

## 2022-11-29 ENCOUNTER — Other Ambulatory Visit: Payer: Self-pay | Admitting: Internal Medicine

## 2022-11-29 ENCOUNTER — Encounter: Payer: Self-pay | Admitting: Student in an Organized Health Care Education/Training Program

## 2022-11-29 ENCOUNTER — Telehealth: Payer: Self-pay

## 2022-11-29 VITALS — BP 134/78 | HR 66 | Temp 98.0°F | Ht 60.0 in | Wt 125.0 lb

## 2022-11-29 DIAGNOSIS — J9621 Acute and chronic respiratory failure with hypoxia: Secondary | ICD-10-CM | POA: Diagnosis not present

## 2022-11-29 DIAGNOSIS — J189 Pneumonia, unspecified organism: Secondary | ICD-10-CM

## 2022-11-29 MED FILL — Pregabalin Cap 150 MG: ORAL | 30 days supply | Qty: 90 | Fill #2 | Status: CN

## 2022-11-29 NOTE — Telephone Encounter (Signed)
Noted  

## 2022-11-29 NOTE — Patient Instructions (Addendum)
We will do flexible bronchoscopy on Friday to rule out infection. In the interim, please stay on the prednisone as you are. After infection is ruled out, we will start tapering the steroids.  The steroid taper will be as follows: -40 mg daily for 3 weeks -20 mg daily for 2 weeks -10 mg daily for 1 week Then stop  You will need to be on the Atovaquone (Mepron) for the whole duration. I will be updating you following the bronchoscopy.  Use your oxygen with exertion and when sleeping. Monitor your oxygen levels at rest when off oxygen.

## 2022-11-29 NOTE — Telephone Encounter (Signed)
Phone pre admit visit 2/8 between 8-1 and covid test 2/8 at 1:00. Patient is aware of dates/times and voiced her understanding.

## 2022-11-29 NOTE — Telephone Encounter (Signed)
BAL scheduled 12/01/2022 at 4:00. [id number]   Rodena Piety, please see bronch info. Thanks

## 2022-11-29 NOTE — Telephone Encounter (Unsigned)
I called yest. To try and see if she can tell me about her functional problems for the [facility]  attending MD statement. Hassan Rowan has taken over  and she pu ton the paper not assessed. I wanted to speak to her and see what she can do with physically. If I do not get call back by today I will send it on. It is already past date to send

## 2022-11-29 NOTE — H&P (View-Only) (Signed)
Assessment & Plan:   1. Acute on chronic respiratory failure with hypoxia (HCC) 2. Multifocal pneumonia  She had presented to clinic for the evaluation of shortness of breath in the setting of known breast cancer s/p chemotherapy, resection, and immune therapy. She received Abemaciclib over the summer which was discontinued in November of 2023. Her symptoms acutely worsened prompting admission to the hospital. She was discharged on oxygen and is presenting for follow up.   Her CT is showing multi-focal infiltrates with ground glass and consolidative opacities. Given the acute worsening of symptoms, my differential includes inflammatory (drug induced pneumonitis from abemaciclib, organizing pneumonia, hypersensitivity pneumonitis given mold exposure at home, DIP/Desquamative Interstitial Pneumonia given smoking) and infectious (bacterial, viral, fungal, PJP, cryptococcus) etiologies. Abemaciclib could have a sustained effect with pneumonitis, but it would be unusual for it to acutely worsen in this fashion sans re-introduction. Hypersensitivity pneumonitis, organizing pneumonia, and an infectious etiology are higher on my differential.  Infectious workup has thus far been non-revealing (candida tropicalis from sputum is a contaminant), albeit it was from a sputum induction sample. I did discuss with Tricia Berry that given the differential, we would need to rule out infection before continuing a long course of steroids. I had recommended bronchoscopy during her hospital stay and she was not keen on it. Today, we again revisited the topic and she has consented to flexible bronchoscopy with bronchoalveolar lavage to rule out bacterial and fungal infections.   In the interim, the patient will continue on steroids at her current dose pending clinical improvement. Plan for a long taper (40 mg for 3 weeks, then 20 mg for 2 weeks, then 10 mg for a week) pending clinical response and infectious  workup.  -flexible bronch/BAL this week -continue steroids, slow taper -smoking cessation counseling given   Return in about 4 weeks (around 12/27/2022).  I spent 30 minutes caring for this patient today, including preparing to see the patient, obtaining a medical history , reviewing a separately obtained history, performing a medically appropriate examination and/or evaluation, counseling and educating the patient/family/caregiver, ordering medications, tests, or procedures, documenting clinical information in the electronic health record, and independently interpreting results (not separately reported/billed) and communicating results to the patient/family/caregiver  Armando Reichert, MD [facility] 11/29/2022 3:36 PM    End of visit medications:  No orders of the defined types were placed in this encounter.    Current Outpatient Medications:    albuterol (PROVENTIL) (2.5 MG/3ML) 0.083% nebulizer solution, Take 3 mLs (2.5 mg total) by nebulization every 6 (six) hours as needed for wheezing or shortness of breath., Disp: 360 mL, Rfl: 12   albuterol (VENTOLIN HFA) 108 (90 Base) MCG/ACT inhaler, Inhale 2 puffs into the lungs every 6 (six) hours as needed for wheezing or shortness of breath., Disp: 6.7 g, Rfl: 0   anastrozole (ARIMIDEX) 1 MG tablet, Take 1 tablet (1 mg total) by mouth daily., Disp: 30 tablet, Rfl: 5   atovaquone (MEPRON) 750 MG/5ML suspension, Take 10 mLs (1,500 mg total) by mouth daily., Disp: 300 mL, Rfl: 0   baclofen (LIORESAL) 10 MG tablet, Take 1 tablet (10 mg total) by mouth 3 (three) times daily as needed for muscle spasms., Disp: 15 tablet, Rfl: 1   Budeson-Glycopyrrol-Formoterol (BREZTRI AEROSPHERE) 160-9-4.8 MCG/ACT AERO, Inhale 2 puffs into the lungs 2 (two) times daily., Disp: 10.7 g, Rfl: 11   cyanocobalamin (VITAMIN B12) 1000 MCG/ML injection, 1000 mcg (1 mL) intramuscular injection in the thigh ( vastus  lateralis) once per month., Disp: 3 mL,  Rfl: 4   fluconazole (DIFLUCAN) 100 MG tablet, Take 1 tablet (100 mg total) by mouth daily., Disp: 6 tablet, Rfl: 0   FLUoxetine (PROZAC) 20 MG capsule, Take 1 capsule (20 mg total) by mouth every morning., Disp: 90 capsule, Rfl: 3   Lactobacillus (PROBIOTIC ACIDOPHILUS PO), Take 1 capsule by mouth daily., Disp: , Rfl:    LORazepam (ATIVAN) 0.5 MG tablet, Take 1 tablet (0.5 mg total) by mouth 2 (two) times daily as needed for anxiety., Disp: 60 tablet, Rfl: 2   Multiple Vitamins-Minerals (MULTIVITAL PO), Take 1 Dose by mouth daily., Disp: , Rfl:    naloxone (NARCAN) nasal spray 4 mg/0.1 mL, Place 1 spray into the nose as needed for opioid-induced respiratory depresssion. In case of emergency (overdose), spray once into each nostril. If no response within 3 minutes, repeat application and call [phone]., Disp: 2 each, Rfl: 0   nicotine (NICODERM CQ - DOSED IN MG/24 HOURS) 21 mg/24hr patch, use 1 patch daily, Disp: 28 patch, Rfl: 0   oxyCODONE (OXY IR/ROXICODONE) 5 MG immediate release tablet, Take 1 tablet (5 mg total) by mouth 2 (two) times daily. Must last 30 days., Disp: 60 tablet, Rfl: 0   predniSONE (DELTASONE) 20 MG tablet, Take 2 tablets (40 mg total) by mouth daily until seen by pulmonary to start taper, Disp: 60 tablet, Rfl: 0   pregabalin (LYRICA) 150 MG capsule, Take 1 capsule (150 mg total) by mouth in the morning, at noon, and at bedtime., Disp: 90 capsule, Rfl: 2   rosuvastatin (CRESTOR) 5 MG tablet, Take 5 mg by mouth daily., Disp: , Rfl:    trimethoprim (TRIMPEX) 100 MG tablet, Take 1 tablet (100 mg total) by mouth daily., Disp: 90 tablet, Rfl: 3 No current facility-administered medications for this visit.  Facility-Administered Medications Ordered in Other Visits:    acetaminophen (TYLENOL) 325 MG tablet, , , ,    diphenhydrAMINE (BENADRYL) 25 mg capsule, , , ,    heparin lock flush 100 unit/mL, 500 Units, Intravenous, Once, Sindy Guadeloupe, MD   Zoledronic Acid (ZOMETA) 4 MG/100ML IVPB,  , , ,    Subjective:   PATIENT ID: Tricia Berry GENDER: female DOB: 08-15-1971, MRN: [id number]  Chief Complaint  Patient presents with   Follow-up    SOB with exertion, dry cough and wheezing.     HPI  Tricia Berry is a 52 year old female with a past medical history of breast cancer who was seen by me in clinic on 11/17/2022 for shortness of breath. She was admitted to the hospital last week for management of acute on chronic hypoxic respiratory failure.   During her initial clinic visit, she reported insidious symptoms in November with shortness of breath and severe hypoxia down to 87% SpO2. CXR at the time showed findings concerning for pneumonia followed by CT scan showing multi-focal infiltrates. Her main symptoms were those of dyspnea and cough. The symptoms started in the summer around the time that Abemaciclib was initiated.  She first noticed a cough that happened throughout the summer and culminated in the sudden onset of shortness of breath. Following this, she was initiated on a course of antibiotics and abemaciclib was halted.  Her symptoms have slowly resolved but she continues to report exertional dyspnea with some mild wheezing that occurs at night.  She was started on Breztri by her primary care physician which she feels was tremendously helpful. She was feeling much improved  during our clinic visit and her oxygen saturations were 97% on room air. She was planned for a chest CT and PFT's.   She reports that a few days after our initial visit her symptoms worsened and she became increasingly short of breath, with increasing cough. She was seen by her primary care physician and was noted to be very hypoxic, and was sent to the ED. In addition to the dyspnea, she reports a cough as well as chills. These symptoms developed acutely over the weekend. She underwent a CT scan of the chest that showed multi-focal infiltrates and she was admitted for further management. I saw her in  consultation in the hospital. Infectious workup sent included a beta-d-glucan, Aspergillus Glucomannan, and sputum induction for cultures and PJP PCR (cultures growing candida). She was starte don 40 mg of oral prednisone with Atovaquone for prophylaxis and was discharged on oxygen. She is presenting today for follow up. She reports improvement in her symptoms of shortness of breath.   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  She has received a course of Pertuzumab and Trastuzumab following her resection. She had been on Abemaciclib which was stopped in November of 2023 and has not been initiated since.   Patient used to work as an Warden/ranger. She is a smoker, and has smoked on and off since her teenage years. She reports having mold in their bathroom that was professionally remediated.  Ancillary information including prior medications, full medical/surgical/family/social histories, and PFTs (when available) are listed below and have been reviewed.   Review of Systems  Constitutional:  Negative for chills, fever, malaise/fatigue and weight loss.  Respiratory:  Positive for cough and shortness of breath. Negative for hemoptysis, sputum production and wheezing.   Cardiovascular:  Negative for chest pain, leg swelling and PND.  Skin:  Negative for rash.     Objective:   Vitals:   11/29/22 1102  BP: 134/78  Pulse: 66  Temp: 98 F (36.7 C)  TempSrc: Temporal  SpO2: 99%  Weight: 125 lb (56.7 kg)  Height: 5' (1.524 m)   99% on RA. Trended on room air and dropped to 93%.  BMI Readings from Last 3 Encounters:  11/29/22 24.41 kg/m  11/21/22 24.41 kg/m  11/21/22 24.76 kg/m   Wt Readings from Last 3 Encounters:  11/29/22 125 lb (56.7 kg)  11/21/22 125 lb (56.7 kg)  11/21/22 126 lb 12.8 oz (57.5 kg)    Physical Exam Constitutional:       General: She is not in acute distress.    Appearance: Normal appearance. She is not ill-appearing.  HENT:     Head: Normocephalic.     Mouth/Throat:     Mouth: Mucous membranes are moist.  Cardiovascular:     Rate and Rhythm: Normal rate and regular rhythm.     Pulses: Normal pulses.     Heart sounds: Normal heart sounds.  Pulmonary:     Effort: Pulmonary effort is normal.     Breath sounds: Rales (on auscultation of the lower lung fields) present.  Abdominal:     General: Abdomen is flat.     Palpations: Abdomen is soft.  Musculoskeletal:        General: Normal range of motion.     Right lower leg: No edema.     Left lower leg: No edema.  Neurological:     General:  No focal deficit present.     Mental Status: She is alert and oriented to person, place, and time. Mental status is at baseline.     Ancillary Information    Past Medical History:  Diagnosis Date   Anxiety    Breast cancer ([place]) 12/2021   left breast IMC   Diverticulitis    Family history of ovarian cancer    GERD (gastroesophageal reflux disease)    IBS (irritable bowel syndrome)    Pneumonia    PONV (postoperative nausea and vomiting)    severe migraine and vomiting post anesthesia   Scoliosis      Family History  Problem Relation Age of Onset   Hypertension Mother    Osteoarthritis Mother    Diverticulitis Mother    Heart failure Father    Hypertension Father    Gout Father    Heart attack Father 47   Diverticulitis Brother    Ovarian cancer Paternal Grandmother      Past Surgical History:  Procedure Laterality Date   ABDOMINAL HYSTERECTOMY     still has ovaries, no gyn cancer, hysterectomy due to endometriosis. NO cervix on exam 01/10/21   BREAST BIOPSY Left 09/15/2020   [place] bx of mass, path pending, Q marker   BREAST BIOPSY Left 09/15/2020   [place] bx of LN, hydromarker, path pending   BREAST RECONSTRUCTION WITH PLACEMENT OF TISSUE EXPANDER AND FLEX HD (ACELLULAR HYDRATED DERMIS)  Bilateral 03/17/2021   Procedure: IMMEDIATE BILATERAL BREAST RECONSTRUCTION WITH PLACEMENT OF TISSUE EXPANDER AND FLEX HD (ACELLULAR HYDRATED DERMIS);  Surgeon: Wallace Going, DO;  Location: [facility];  Service: Plastics;  Laterality: Bilateral;   CAPSULECTOMY Right 10/18/2022   Procedure: Right breast capsule release with adjustment;  Surgeon: Wallace Going, DO;  Location: [facility];  Service: Plastics;  Laterality: Right;   COLONOSCOPY WITH PROPOFOL N/A 11/21/2021   Procedure: COLONOSCOPY WITH PROPOFOL;  Surgeon: Lin Landsman, MD;  Location: [facility];  Service: Gastroenterology;  Laterality: N/A;   IR IMAGING GUIDED PORT INSERTION  10/01/2020   MODIFIED MASTECTOMY Left 03/17/2021   Procedure: LEFT MODIFIED RADICAL MASTECTOMY;  Surgeon: Erroll Luna, MD;  Location: [facility];  Service: General;  Laterality: Left;   PORTA CATH REMOVAL Right 03/17/2021   Procedure: PORTA CATH REMOVAL;  Surgeon: Erroll Luna, MD;  Location: [facility];  Service: General;  Laterality: Right;   REMOVAL OF BILATERAL TISSUE EXPANDERS WITH PLACEMENT OF BILATERAL BREAST IMPLANTS Bilateral 05/09/2021   Procedure: REMOVAL OF BILATERAL TISSUE EXPANDERS WITH PLACEMENT OF BILATERAL BREAST IMPLANTS;  Surgeon: Wallace Going, DO;  Location: [facility];  Service: Plastics;  Laterality: Bilateral;  90 min   TOTAL MASTECTOMY Right 03/17/2021   Procedure: TOTAL MASTECTOMY;  Surgeon: Erroll Luna, MD;  Location: [facility];  Service: General;  Laterality: Right;    Social History   Socioeconomic History   Marital status: Divorced    Spouse name: Not on file   Number of children: 2   Years of education: Not on file   Highest education level: Not on file  Occupational History   Occupation: Nurse    Employer: [employer]  Tobacco Use   Smoking status: Every Day    Packs/day: 1.00    Years: 20.00     Total pack years: 20.00    Types: Cigarettes   Smokeless tobacco: Never   Tobacco comments:    5 cigarettes daily-11/29/2022  Vaping Use  Vaping Use: Never used  Substance and Sexual Activity   Alcohol use: Not Currently   Drug use: No   Sexual activity: Not Currently    Birth control/protection: Surgical    Comment: hyst  Other Topics Concern   Not on file  Social History Narrative   Patient works as an Warden/ranger at [employer]. She has 2 children at home who have special needs. She and her spouse are primary caregivers.    Social Determinants of Health   Financial Resource Strain: Not on file  Food Insecurity: No Food Insecurity (11/21/2022)   Hunger Vital Sign    Worried About Running Out of Food in the Last Year: Never true    Ran Out of Food in the Last Year: Never true  Transportation Needs: No Transportation Needs (11/21/2022)   PRAPARE - Hydrologist (Medical): No    Lack of Transportation (Non-Medical): No  Physical Activity: Not on file  Stress: Not on file  Social Connections: Not on file  Intimate Partner Violence: Not At Risk (11/21/2022)   Humiliation, Afraid, Rape, and Kick questionnaire    Fear of Current or Ex-Partner: No    Emotionally Abused: No    Physically Abused: No    Sexually Abused: No     Allergies  Allergen Reactions   Morphine Nausea And Vomiting and Other (See Comments)    migranes Other reaction(s): Headache Migraine, vomiting   Sertraline Hcl     REACTION: Worsened symptoms of IBS   Sulfa Antibiotics Rash   Sulfamethoxazole Rash   Sulfonamide Derivatives Rash     CBC    Component Value Date/Time   WBC 13.0 (H) 11/24/2022 0212   RBC 3.83 (L) 11/24/2022 0212   HGB 11.5 (L) 11/24/2022 0212   HGB 14.3 08/23/2020 1033   HCT 36.0 11/24/2022 0212   HCT 42.9 08/23/2020 1033   PLT 180 11/24/2022 0212   PLT 222 08/23/2020 1033   MCV 94.0 11/24/2022 0212   MCV 91 08/23/2020 1033   MCH 30.0 11/24/2022 0212   MCHC  31.9 11/24/2022 0212   RDW 14.7 11/24/2022 0212   RDW 13.1 08/23/2020 1033   LYMPHSABS 2.0 11/21/2022 1341   LYMPHSABS 4.2 (H) 08/23/2020 1033   MONOABS 1.0 11/21/2022 1341   EOSABS 0.0 11/21/2022 1341   EOSABS 0.1 08/23/2020 1033   BASOSABS 0.0 11/21/2022 1341   BASOSABS 0.1 08/23/2020 1033    Pulmonary Functions Testing Results:     No data to display          Outpatient Medications Prior to Visit  Medication Sig Dispense Refill   albuterol (PROVENTIL) (2.5 MG/3ML) 0.083% nebulizer solution Take 3 mLs (2.5 mg total) by nebulization every 6 (six) hours as needed for wheezing or shortness of breath. 360 mL 12   albuterol (VENTOLIN HFA) 108 (90 Base) MCG/ACT inhaler Inhale 2 puffs into the lungs every 6 (six) hours as needed for wheezing or shortness of breath. 6.7 g 0   anastrozole (ARIMIDEX) 1 MG tablet Take 1 tablet (1 mg total) by mouth daily. 30 tablet 5   atovaquone (MEPRON) 750 MG/5ML suspension Take 10 mLs (1,500 mg total) by mouth daily. 300 mL 0   baclofen (LIORESAL) 10 MG tablet Take 1 tablet (10 mg total) by mouth 3 (three) times daily as needed for muscle spasms. 15 tablet 1   Budeson-Glycopyrrol-Formoterol (BREZTRI AEROSPHERE) 160-9-4.8 MCG/ACT AERO Inhale 2 puffs into the lungs 2 (two) times daily. 10.7 g 11  cyanocobalamin (VITAMIN B12) 1000 MCG/ML injection 1000 mcg (1 mL) intramuscular injection in the thigh ( vastus lateralis) once per month. 3 mL 4   fluconazole (DIFLUCAN) 100 MG tablet Take 1 tablet (100 mg total) by mouth daily. 6 tablet 0   FLUoxetine (PROZAC) 20 MG capsule Take 1 capsule (20 mg total) by mouth every morning. 90 capsule 3   Lactobacillus (PROBIOTIC ACIDOPHILUS PO) Take 1 capsule by mouth daily.     LORazepam (ATIVAN) 0.5 MG tablet Take 1 tablet (0.5 mg total) by mouth 2 (two) times daily as needed for anxiety. 60 tablet 2   Multiple Vitamins-Minerals (MULTIVITAL PO) Take 1 Dose by mouth daily.     naloxone (NARCAN) nasal spray 4 mg/0.1 mL  Place 1 spray into the nose as needed for opioid-induced respiratory depresssion. In case of emergency (overdose), spray once into each nostril. If no response within 3 minutes, repeat application and call [phone]. [phone] each 0   nicotine (NICODERM CQ - DOSED IN MG/24 HOURS) 21 mg/24hr patch use 1 patch daily 28 patch 0   oxyCODONE (OXY IR/ROXICODONE) 5 MG immediate release tablet Take 1 tablet (5 mg total) by mouth 2 (two) times daily. Must last 30 days. 60 tablet 0   predniSONE (DELTASONE) 20 MG tablet Take 2 tablets (40 mg total) by mouth daily until seen by pulmonary to start taper 60 tablet 0   pregabalin (LYRICA) 150 MG capsule Take 1 capsule (150 mg total) by mouth in the morning, at noon, and at bedtime. 90 capsule 2   rosuvastatin (CRESTOR) 5 MG tablet Take 5 mg by mouth daily.     trimethoprim (TRIMPEX) 100 MG tablet Take 1 tablet (100 mg total) by mouth daily. 90 tablet 3   azithromycin (ZITHROMAX) 250 MG tablet Take 1 tablet (250 mg total) by mouth daily for 2 days. 2 each 0   Facility-Administered Medications Prior to Visit  Medication Dose Route Frequency Provider Last Rate Last Admin   acetaminophen (TYLENOL) 325 MG tablet            diphenhydrAMINE (BENADRYL) 25 mg capsule            heparin lock flush 100 unit/mL  500 Units Intravenous Once Sindy Guadeloupe, MD       Zoledronic Acid (ZOMETA) 4 MG/100ML IVPB

## 2022-11-29 NOTE — Progress Notes (Signed)
Assessment & Plan:   1. Acute on chronic respiratory failure with hypoxia (HCC) 2. Multifocal pneumonia  Tricia Berry had presented to clinic for the evaluation of shortness of breath in the setting of known breast cancer s/p chemotherapy, resection, and immune therapy. Tricia Berry received Abemaciclib over the summer which was discontinued in November of 2023. Tricia Berry symptoms acutely worsened prompting admission to the hospital. Tricia Berry was discharged on oxygen and is presenting for follow up.   Tricia Berry CT is showing multi-focal infiltrates with ground glass and consolidative opacities. Given the acute worsening of symptoms, my differential includes inflammatory (drug induced pneumonitis from abemaciclib, organizing pneumonia, hypersensitivity pneumonitis given mold exposure at home, DIP/Desquamative Interstitial Pneumonia given smoking) and infectious (bacterial, viral, fungal, PJP, cryptococcus) etiologies. Abemaciclib could have a sustained effect with pneumonitis, but it would be unusual for it to acutely worsen in this fashion sans re-introduction. Hypersensitivity pneumonitis, organizing pneumonia, and an infectious etiology are higher on my differential.  Infectious workup has thus far been non-revealing (candida tropicalis from sputum is a contaminant), albeit it was from a sputum induction sample. I did discuss with Tricia Berry that given the differential, we would need to rule out infection before continuing a long course of steroids. I had recommended bronchoscopy during Tricia Berry hospital stay and Tricia Berry was not keen on it. Today, we again revisited the topic and Tricia Berry has consented to flexible bronchoscopy with bronchoalveolar lavage to rule out bacterial and fungal infections.   In the interim, the patient will continue on steroids at Tricia Berry current dose pending clinical improvement. Plan for a long taper (40 mg for 3 weeks, then 20 mg for 2 weeks, then 10 mg for a week) pending clinical response and infectious  workup.  -flexible bronch/BAL this week -continue steroids, slow taper -smoking cessation counseling given   Return in about 4 weeks (around 12/27/2022).  I spent 30 minutes caring for this patient today, including preparing to see the patient, obtaining a medical history , reviewing a separately obtained history, performing a medically appropriate examination and/or evaluation, counseling and educating the patient/family/caregiver, ordering medications, tests, or procedures, documenting clinical information in the electronic health record, and independently interpreting results (not separately reported/billed) and communicating results to the patient/family/caregiver  Armando Reichert, MD [facility] 11/29/2022 3:36 PM    End of visit medications:  No orders of the defined types were placed in this encounter.    Current Outpatient Medications:    albuterol (PROVENTIL) (2.5 MG/3ML) 0.083% nebulizer solution, Take 3 mLs (2.5 mg total) by nebulization every 6 (six) hours as needed for wheezing or shortness of breath., Disp: 360 mL, Rfl: 12   albuterol (VENTOLIN HFA) 108 (90 Base) MCG/ACT inhaler, Inhale 2 puffs into the lungs every 6 (six) hours as needed for wheezing or shortness of breath., Disp: 6.7 g, Rfl: 0   anastrozole (ARIMIDEX) 1 MG tablet, Take 1 tablet (1 mg total) by mouth daily., Disp: 30 tablet, Rfl: 5   atovaquone (MEPRON) 750 MG/5ML suspension, Take 10 mLs (1,500 mg total) by mouth daily., Disp: 300 mL, Rfl: 0   baclofen (LIORESAL) 10 MG tablet, Take 1 tablet (10 mg total) by mouth 3 (three) times daily as needed for muscle spasms., Disp: 15 tablet, Rfl: 1   Budeson-Glycopyrrol-Formoterol (BREZTRI AEROSPHERE) 160-9-4.8 MCG/ACT AERO, Inhale 2 puffs into the lungs 2 (two) times daily., Disp: 10.7 g, Rfl: 11   cyanocobalamin (VITAMIN B12) 1000 MCG/ML injection, 1000 mcg (1 mL) intramuscular injection in the thigh ( vastus  lateralis) once per month., Disp: 3 mL,  Rfl: 4   fluconazole (DIFLUCAN) 100 MG tablet, Take 1 tablet (100 mg total) by mouth daily., Disp: 6 tablet, Rfl: 0   FLUoxetine (PROZAC) 20 MG capsule, Take 1 capsule (20 mg total) by mouth every morning., Disp: 90 capsule, Rfl: 3   Lactobacillus (PROBIOTIC ACIDOPHILUS PO), Take 1 capsule by mouth daily., Disp: , Rfl:    LORazepam (ATIVAN) 0.5 MG tablet, Take 1 tablet (0.5 mg total) by mouth 2 (two) times daily as needed for anxiety., Disp: 60 tablet, Rfl: 2   Multiple Vitamins-Minerals (MULTIVITAL PO), Take 1 Dose by mouth daily., Disp: , Rfl:    naloxone (NARCAN) nasal spray 4 mg/0.1 mL, Place 1 spray into the nose as needed for opioid-induced respiratory depresssion. In case of emergency (overdose), spray once into each nostril. If no response within 3 minutes, repeat application and call 166., Disp: 2 each, Rfl: 0   nicotine (NICODERM CQ - DOSED IN MG/24 HOURS) 21 mg/24hr patch, use 1 patch daily, Disp: 28 patch, Rfl: 0   oxyCODONE (OXY IR/ROXICODONE) 5 MG immediate release tablet, Take 1 tablet (5 mg total) by mouth 2 (two) times daily. Must last 30 days., Disp: 60 tablet, Rfl: 0   predniSONE (DELTASONE) 20 MG tablet, Take 2 tablets (40 mg total) by mouth daily until seen by pulmonary to start taper, Disp: 60 tablet, Rfl: 0   pregabalin (LYRICA) 150 MG capsule, Take 1 capsule (150 mg total) by mouth in the morning, at noon, and at bedtime., Disp: 90 capsule, Rfl: 2   rosuvastatin (CRESTOR) 5 MG tablet, Take 5 mg by mouth daily., Disp: , Rfl:    trimethoprim (TRIMPEX) 100 MG tablet, Take 1 tablet (100 mg total) by mouth daily., Disp: 90 tablet, Rfl: 3 No current facility-administered medications for this visit.  Facility-Administered Medications Ordered in Other Visits:    acetaminophen (TYLENOL) 325 MG tablet, , , ,    diphenhydrAMINE (BENADRYL) 25 mg capsule, , , ,    heparin lock flush 100 unit/mL, 500 Units, Intravenous, Once, Sindy Guadeloupe, MD   Zoledronic Acid (ZOMETA) 4 MG/100ML IVPB,  , , ,    Subjective:   PATIENT ID: Tricia Berry GENDER: female DOB: 1971-10-04, MRN: [id number]  Chief Complaint  Patient presents with   Follow-up    SOB with exertion, dry cough and wheezing.     HPI  Tricia Berry is a 52 year old female with a past medical history of breast cancer who was seen by me in clinic on 11/17/2022 for shortness of breath. Tricia Berry was admitted to the hospital last week for management of acute on chronic hypoxic respiratory failure.   During Tricia Berry initial clinic visit, Tricia Berry reported insidious symptoms in November with shortness of breath and severe hypoxia down to 87% SpO2. CXR at the time showed findings concerning for pneumonia followed by CT scan showing multi-focal infiltrates. Tricia Berry main symptoms were those of dyspnea and cough. The symptoms started in the summer around the time that Abemaciclib was initiated.  Tricia Berry first noticed a cough that happened throughout the summer and culminated in the sudden onset of shortness of breath. Following this, Tricia Berry was initiated on a course of antibiotics and abemaciclib was halted.  Tricia Berry symptoms have slowly resolved but Tricia Berry continues to report exertional dyspnea with some mild wheezing that occurs at night.  Tricia Berry was started on Breztri by Tricia Berry primary care physician which Tricia Berry feels was tremendously helpful. Tricia Berry was feeling much improved  during our clinic visit and Tricia Berry oxygen saturations were 97% on room air. Tricia Berry was planned for a chest CT and PFT's.   Tricia Berry reports that a few days after our initial visit Tricia Berry symptoms worsened and Tricia Berry became increasingly short of breath, with increasing cough. Tricia Berry was seen by Tricia Berry primary care physician and was noted to be very hypoxic, and was sent to the ED. In addition to the dyspnea, Tricia Berry reports a cough as well as chills. These symptoms developed acutely over the weekend. Tricia Berry underwent a CT scan of the chest that showed multi-focal infiltrates and Tricia Berry was admitted for further management. I saw Tricia Berry in  consultation in the hospital. Infectious workup sent included a beta-d-glucan, Aspergillus Glucomannan, and sputum induction for cultures and PJP PCR (cultures growing candida). Tricia Berry was starte don 40 mg of oral prednisone with Atovaquone for prophylaxis and was discharged on oxygen. Tricia Berry is presenting today for follow up. Tricia Berry reports improvement in Tricia Berry symptoms of shortness of breath.   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  Tricia Berry has received a course of Pertuzumab and Trastuzumab following Tricia Berry resection. Tricia Berry had been on Abemaciclib which was stopped in November of 2023 and has not been initiated since.   Patient used to work as an Warden/ranger. Tricia Berry is a smoker, and has smoked on and off since Tricia Berry teenage years. Tricia Berry reports having mold in their bathroom that was professionally remediated.  Ancillary information including prior medications, full medical/surgical/family/social histories, and PFTs (when available) are listed below and have been reviewed.   Review of Systems  Constitutional:  Negative for chills, fever, malaise/fatigue and weight loss.  Respiratory:  Positive for cough and shortness of breath. Negative for hemoptysis, sputum production and wheezing.   Cardiovascular:  Negative for chest pain, leg swelling and PND.  Skin:  Negative for rash.     Objective:   Vitals:   11/29/22 1102  BP: 134/78  Pulse: 66  Temp: 98 F (36.7 C)  TempSrc: Temporal  SpO2: 99%  Weight: 125 lb (56.7 kg)  Height: 5' (1.524 m)   99% on RA. Trended on room air and dropped to 93%.  BMI Readings from Last 3 Encounters:  11/29/22 24.41 kg/m  11/21/22 24.41 kg/m  11/21/22 24.76 kg/m   Wt Readings from Last 3 Encounters:  11/29/22 125 lb (56.7 kg)  11/21/22 125 lb (56.7 kg)  11/21/22 126 lb 12.8 oz (57.5 kg)    Physical Exam Constitutional:       General: Tricia Berry is not in acute distress.    Appearance: Normal appearance. Tricia Berry is not ill-appearing.  HENT:     Head: Normocephalic.     Mouth/Throat:     Mouth: Mucous membranes are moist.  Cardiovascular:     Rate and Rhythm: Normal rate and regular rhythm.     Pulses: Normal pulses.     Heart sounds: Normal heart sounds.  Pulmonary:     Effort: Pulmonary effort is normal.     Breath sounds: Rales (on auscultation of the lower lung fields) present.  Abdominal:     General: Abdomen is flat.     Palpations: Abdomen is soft.  Musculoskeletal:        General: Normal range of motion.     Right lower leg: No edema.     Left lower leg: No edema.  Neurological:     General:  No focal deficit present.     Mental Status: Tricia Berry is alert and oriented to person, place, and time. Mental status is at baseline.     Ancillary Information    Past Medical History:  Diagnosis Date   Anxiety    Breast cancer ([place]) 12/2021   left breast IMC   Diverticulitis    Family history of ovarian cancer    GERD (gastroesophageal reflux disease)    IBS (irritable bowel syndrome)    Pneumonia    PONV (postoperative nausea and vomiting)    severe migraine and vomiting post anesthesia   Scoliosis      Family History  Problem Relation Age of Onset   Hypertension Mother    Osteoarthritis Mother    Diverticulitis Mother    Heart failure Father    Hypertension Father    Gout Father    Heart attack Father 48   Diverticulitis Brother    Ovarian cancer Paternal Grandmother      Past Surgical History:  Procedure Laterality Date   ABDOMINAL HYSTERECTOMY     still has ovaries, no gyn cancer, hysterectomy due to endometriosis. NO cervix on exam 01/10/21   BREAST BIOPSY Left 09/15/2020   [place] bx of mass, path pending, Q marker   BREAST BIOPSY Left 09/15/2020   [place] bx of LN, hydromarker, path pending   BREAST RECONSTRUCTION WITH PLACEMENT OF TISSUE EXPANDER AND FLEX HD (ACELLULAR HYDRATED DERMIS)  Bilateral 03/17/2021   Procedure: IMMEDIATE BILATERAL BREAST RECONSTRUCTION WITH PLACEMENT OF TISSUE EXPANDER AND FLEX HD (ACELLULAR HYDRATED DERMIS);  Surgeon: Wallace Going, DO;  Location: [place];  Service: Plastics;  Laterality: Bilateral;   CAPSULECTOMY Right 10/18/2022   Procedure: Right breast capsule release with adjustment;  Surgeon: Wallace Going, DO;  Location: [place];  Service: Plastics;  Laterality: Right;   COLONOSCOPY WITH PROPOFOL N/A 11/21/2021   Procedure: COLONOSCOPY WITH PROPOFOL;  Surgeon: Lin Landsman, MD;  [facility] END[facility]COPY;  Service: Gastroenterology;  Laterality: N/A;   IR IMAGING GUIDED PORT INSERTION  10/01/2020   MODIFIED MASTECTOMY Left 03/17/2021   Procedure: LEFT MODIFIED RADICAL MASTECTOMY;  Surgeon: Erroll Luna, MD;  Location: [place];  Service: General;  Laterality: Left;   PORTA CATH REMOVAL Right 03/17/2021   Procedure: PORTA CATH REMOVAL;  Surgeon: Erroll Luna, MD;  Location: [place];  Service: General;  Laterality: Right;   REMOVAL OF BILATERAL TISSUE EXPANDERS WITH PLACEMENT OF BILATERAL BREAST IMPLANTS Bilateral 05/09/2021   Procedure: REMOVAL OF BILATERAL TISSUE EXPANDERS WITH PLACEMENT OF BILATERAL BREAST IMPLANTS;  Surgeon: Wallace Going, DO;  Location: [place];  Service: Plastics;  Laterality: Bilateral;  90 min   TOTAL MASTECTOMY Right 03/17/2021   Procedure: TOTAL MASTECTOMY;  Surgeon: Erroll Luna, MD;  Location: [place];  Service: General;  Laterality: Right;    Social History   Socioeconomic History   Marital status: Divorced    Spouse name: Not on file   Number of children: 2   Years of education: Not on file   Highest education level: Not on file  Occupational History   Occupation: Nurse    Employer: [employer]  Tobacco Use   Smoking status: Every Day    Packs/day: 1.00    Years: 20.00     Total pack years: 20.00    Types: Cigarettes   Smokeless tobacco: Never   Tobacco comments:    5 cigarettes daily-11/29/2022  Vaping Use  Vaping Use: Never used  Substance and Sexual Activity   Alcohol use: Not Currently   Drug use: No   Sexual activity: Not Currently    Birth control/protection: Surgical    Comment: hyst  Other Topics Concern   Not on file  Social History Narrative   Patient works as an Warden/ranger at [employer]. Tricia Berry has 2 children at home who have special needs. Tricia Berry and Tricia Berry spouse are primary caregivers.    Social Determinants of Health   Financial Resource Strain: Not on file  Food Insecurity: No Food Insecurity (11/21/2022)   Hunger Vital Sign    Worried About Running Out of Food in the Last Year: Never true    Ran Out of Food in the Last Year: Never true  Transportation Needs: No Transportation Needs (11/21/2022)   PRAPARE - Hydrologist (Medical): No    Lack of Transportation (Non-Medical): No  Physical Activity: Not on file  Stress: Not on file  Social Connections: Not on file  Intimate Partner Violence: Not At Risk (11/21/2022)   Humiliation, Afraid, Rape, and Kick questionnaire    Fear of Current or Ex-Partner: No    Emotionally Abused: No    Physically Abused: No    Sexually Abused: No     Allergies  Allergen Reactions   Morphine Nausea And Vomiting and Other (See Comments)    migranes Other reaction(s): Headache Migraine, vomiting   Sertraline Hcl     REACTION: Worsened symptoms of IBS   Sulfa Antibiotics Rash   Sulfamethoxazole Rash   Sulfonamide Derivatives Rash     CBC    Component Value Date/Time   WBC 13.0 (H) 11/24/2022 0212   RBC 3.83 (L) 11/24/2022 0212   HGB 11.5 (L) 11/24/2022 0212   HGB 14.3 08/23/2020 1033   HCT 36.0 11/24/2022 0212   HCT 42.9 08/23/2020 1033   PLT 180 11/24/2022 0212   PLT 222 08/23/2020 1033   MCV 94.0 11/24/2022 0212   MCV 91 08/23/2020 1033   MCH 30.0 11/24/2022 0212   MCHC  31.9 11/24/2022 0212   RDW 14.7 11/24/2022 0212   RDW 13.1 08/23/2020 1033   LYMPHSABS 2.0 11/21/2022 1341   LYMPHSABS 4.2 (H) 08/23/2020 1033   MONOABS 1.0 11/21/2022 1341   EOSABS 0.0 11/21/2022 1341   EOSABS 0.1 08/23/2020 1033   BASOSABS 0.0 11/21/2022 1341   BASOSABS 0.1 08/23/2020 1033    Pulmonary Functions Testing Results:     No data to display          Outpatient Medications Prior to Visit  Medication Sig Dispense Refill   albuterol (PROVENTIL) (2.5 MG/3ML) 0.083% nebulizer solution Take 3 mLs (2.5 mg total) by nebulization every 6 (six) hours as needed for wheezing or shortness of breath. 360 mL 12   albuterol (VENTOLIN HFA) 108 (90 Base) MCG/ACT inhaler Inhale 2 puffs into the lungs every 6 (six) hours as needed for wheezing or shortness of breath. 6.7 g 0   anastrozole (ARIMIDEX) 1 MG tablet Take 1 tablet (1 mg total) by mouth daily. 30 tablet 5   atovaquone (MEPRON) 750 MG/5ML suspension Take 10 mLs (1,500 mg total) by mouth daily. 300 mL 0   baclofen (LIORESAL) 10 MG tablet Take 1 tablet (10 mg total) by mouth 3 (three) times daily as needed for muscle spasms. 15 tablet 1   Budeson-Glycopyrrol-Formoterol (BREZTRI AEROSPHERE) 160-9-4.8 MCG/ACT AERO Inhale 2 puffs into the lungs 2 (two) times daily. 10.7 g 11  cyanocobalamin (VITAMIN B12) 1000 MCG/ML injection 1000 mcg (1 mL) intramuscular injection in the thigh ( vastus lateralis) once per month. 3 mL 4   fluconazole (DIFLUCAN) 100 MG tablet Take 1 tablet (100 mg total) by mouth daily. 6 tablet 0   FLUoxetine (PROZAC) 20 MG capsule Take 1 capsule (20 mg total) by mouth every morning. 90 capsule 3   Lactobacillus (PROBIOTIC ACIDOPHILUS PO) Take 1 capsule by mouth daily.     LORazepam (ATIVAN) 0.5 MG tablet Take 1 tablet (0.5 mg total) by mouth 2 (two) times daily as needed for anxiety. 60 tablet 2   Multiple Vitamins-Minerals (MULTIVITAL PO) Take 1 Dose by mouth daily.     naloxone (NARCAN) nasal spray 4 mg/0.1 mL  Place 1 spray into the nose as needed for opioid-induced respiratory depresssion. In case of emergency (overdose), spray once into each nostril. If no response within 3 minutes, repeat application and call 482. 2 each 0   nicotine (NICODERM CQ - DOSED IN MG/24 HOURS) 21 mg/24hr patch use 1 patch daily 28 patch 0   oxyCODONE (OXY IR/ROXICODONE) 5 MG immediate release tablet Take 1 tablet (5 mg total) by mouth 2 (two) times daily. Must last 30 days. 60 tablet 0   predniSONE (DELTASONE) 20 MG tablet Take 2 tablets (40 mg total) by mouth daily until seen by pulmonary to start taper 60 tablet 0   pregabalin (LYRICA) 150 MG capsule Take 1 capsule (150 mg total) by mouth in the morning, at noon, and at bedtime. 90 capsule 2   rosuvastatin (CRESTOR) 5 MG tablet Take 5 mg by mouth daily.     trimethoprim (TRIMPEX) 100 MG tablet Take 1 tablet (100 mg total) by mouth daily. 90 tablet 3   azithromycin (ZITHROMAX) 250 MG tablet Take 1 tablet (250 mg total) by mouth daily for 2 days. 2 each 0   Facility-Administered Medications Prior to Visit  Medication Dose Route Frequency Provider Last Rate Last Admin   acetaminophen (TYLENOL) 325 MG tablet            diphenhydrAMINE (BENADRYL) 25 mg capsule            heparin lock flush 100 unit/mL  500 Units Intravenous Once Sindy Guadeloupe, MD       Zoledronic Acid (ZOMETA) 4 MG/100ML IVPB

## 2022-11-29 NOTE — Telephone Encounter (Signed)
For the code [id number] Prior Auth Not Required Refer # [id number]

## 2022-11-30 ENCOUNTER — Telehealth: Payer: Self-pay | Admitting: Student in an Organized Health Care Education/Training Program

## 2022-11-30 ENCOUNTER — Other Ambulatory Visit: Payer: Self-pay

## 2022-11-30 ENCOUNTER — Encounter: Payer: Self-pay | Admitting: *Deleted

## 2022-11-30 ENCOUNTER — Encounter: Payer: Self-pay | Admitting: Oncology

## 2022-11-30 ENCOUNTER — Encounter
Admission: RE | Admit: 2022-11-30 | Discharge: 2022-11-30 | Disposition: A | Discharge status: Home / Self Care | Payer: Commercial Managed Care - HMO | Source: Ambulatory Visit | Attending: Student in an Organized Health Care Education/Training Program | Admitting: Student in an Organized Health Care Education/Training Program

## 2022-11-30 DIAGNOSIS — Z01818 Encounter for other preprocedural examination: Secondary | ICD-10-CM | POA: Insufficient documentation

## 2022-11-30 DIAGNOSIS — Z1152 Encounter for screening for COVID-19: Secondary | ICD-10-CM | POA: Insufficient documentation

## 2022-11-30 HISTORY — DX: Anemia, unspecified: D64.9

## 2022-11-30 HISTORY — DX: Myoneural disorder, unspecified: G70.9

## 2022-11-30 HISTORY — DX: Cardiac arrhythmia, unspecified: I49.9

## 2022-11-30 HISTORY — DX: Dyspnea, unspecified: R06.00

## 2022-11-30 HISTORY — DX: Angina pectoris, unspecified: I20.9

## 2022-11-30 HISTORY — DX: Headache, unspecified: R51.9

## 2022-11-30 LAB — PNEUMOCYSTIS PCR: Result Pneumocystis PCR: NEGATIVE

## 2022-11-30 MED ORDER — BACLOFEN 10 MG PO TABS
10.0000 mg | ORAL_TABLET | Freq: Three times a day (TID) | ORAL | 1 refills | Status: DC | PRN
  Filled 2022-11-30: qty 15, 5d supply, fill #0
  Filled 2022-12-06 (×2): qty 15, 5d supply, fill #1

## 2022-11-30 MED FILL — Pregabalin Cap 150 MG: ORAL | 30 days supply | Qty: 90 | Fill #2 | Status: AC

## 2022-11-30 NOTE — Patient Instructions (Addendum)
Your procedure is scheduled on: 12/01/22 - Friday Report to the Registration Desk on the 1st floor of the [facility]. To find out your arrival time, please call [phone] between 1PM - 3PM on: 11/30/22 - Thursday If your arrival time is 6:00 am, do not arrive before that time as the [facility] entrance doors do not open until 6:00 am.  REMEMBER: Instructions that are not followed completely may result in serious medical risk, up to and including death; or upon the discretion of your surgeon and anesthesiologist your surgery may need to be rescheduled.  Do not eat food or drink any liquids after midnight the night before surgery.  No gum chewing or hard candies  One week prior to surgery: Stop Anti-inflammatories (NSAIDS) such as Advil, Aleve, Ibuprofen, Motrin, Naproxen, Naprosyn and Aspirin based products such as Excedrin, Goody's Powder, BC Powder.  Stop ANY OVER THE COUNTER supplements until after surgery,   You may take Tylenol if needed for pain up until the day of surgery.  TAKE ONLY THESE MEDICATIONS THE MORNING OF SURGERY WITH A SIP OF WATER:  albuterol (PROVENTIL) (2.5 MG/3ML) 0.083% nebulizer  anastrozole (ARIMIDEX)  atovaquone (MEPRON)  Budeson-Glycopyrrol-Formoterol (BREZTRI AEROSPHERE)  FLUoxetine (PROZAC)  oxyCODONE (OXY IR/ROXICODONE)  pregabalin (LYRICA)    Use inhaler albuterol (VENTOLIN HFA)  on the day of surgery and bring to the hospital.  No Alcohol for 24 hours before or after surgery.  No Smoking including e-cigarettes for 24 hours before surgery.  No chewable tobacco products for at least 6 hours before surgery.  No nicotine patches on the day of surgery.  Do not use any "recreational" drugs for at least a week (preferably 2 weeks) before your surgery.  Please be advised that the combination of cocaine and anesthesia may have negative outcomes, up to and including death. If you test positive for cocaine, your surgery will be cancelled.  On the  morning of surgery brush your teeth with toothpaste and water, you may rinse your mouth with mouthwash if you wish. Do not swallow any toothpaste or mouthwash.  Do not wear jewelry, make-up, hairpins, clips or nail polish.  Do not wear lotions, powders, or perfumes.   Do not shave body hair from the neck down 48 hours before surgery.  Contact lenses, hearing aids and dentures may not be worn into surgery.  Do not bring valuables to the hospital. [facility] is not responsible for any missing/lost belongings or valuables.   Notify your doctor if there is any change in your medical condition (cold, fever, infection).  Wear comfortable clothing (specific to your surgery type) to the hospital.  After surgery, you can help prevent lung complications by doing breathing exercises.  Take deep breaths and cough every 1-2 hours. Your doctor may order a device called an Incentive Spirometer to help you take deep breaths. When coughing or sneezing, hold a pillow firmly against your incision with both hands. This is called "splinting." Doing this helps protect your incision. It also decreases belly discomfort.  If you are being admitted to the hospital overnight, leave your suitcase in the car. After surgery it may be brought to your room.  In case of increased patient census, it may be necessary for you, the patient, to continue your postoperative care in the Same Day Surgery department.  If you are being discharged the day of surgery, you will not be allowed to drive home. You will need a responsible individual to drive you home and stay with  you for 24 hours after surgery.   If you are taking public transportation, you will need to have a responsible individual with you.  Please call the [facility] Dept. at [phone] if you have any questions about these instructions.  Surgery Visitation Policy:  Patients undergoing a surgery or procedure may have two family members or  support persons with them as long as the person is not COVID-19 positive or experiencing its symptoms.   Inpatient Visitation:    Visiting hours are 7 a.m. to 8 p.m. Up to four visitors are allowed at one time in a patient room. The visitors may rotate out with other people during the day. One designated support person (adult) may remain overnight.  Due to an increase in RSV and influenza rates and associated hospitalizations, children ages 43 and under will not be able to visit patients in [facility]. Masks continue to be strongly recommended.    Preparing for Surgery with CHLORHEXIDINE GLUCONATE (CHG) Soap  Chlorhexidine Gluconate (CHG) Soap  o An antiseptic cleaner that kills germs and bonds with the skin to continue killing germs even after washing  o Used for showering the night before surgery and morning of surgery  Before surgery, you can play an important role by reducing the number of germs on your skin.  CHG (Chlorhexidine gluconate) soap is an antiseptic cleanser which kills germs and bonds with the skin to continue killing germs even after washing.  Please do not use if you have an allergy to CHG or antibacterial soaps. If your skin becomes reddened/irritated stop using the CHG.  1. Shower the NIGHT BEFORE SURGERY and the MORNING OF SURGERY with CHG soap.  2. If you choose to wash your hair, wash your hair first as usual with your normal shampoo.  3. After shampooing, rinse your hair and body thoroughly to remove the shampoo.  4. Use CHG as you would any other liquid soap. You can apply CHG directly to the skin and wash gently with a scrungie or a clean washcloth.  5. Apply the CHG soap to your body only from the neck down. Do not use on open wounds or open sores. Avoid contact with your eyes, ears, mouth, and genitals (private parts). Wash face and genitals (private parts) with your normal soap.  6. Wash thoroughly, paying special attention to the area where  your surgery will be performed.  7. Thoroughly rinse your body with warm water.  8. Do not shower/wash with your normal soap after using and rinsing off the CHG soap.  9. Pat yourself dry with a clean towel.  10. Wear clean pajamas to bed the night before surgery.  12. Place clean sheets on your bed the night of your first shower and do not sleep with pets.  13. Shower again with the CHG soap on the day of surgery prior to arriving at the hospital.  14. Do not apply any deodorants/lotions/powders.  15. Please wear clean clothes to the hospital.

## 2022-11-30 NOTE — Telephone Encounter (Signed)
Received a copy of a letter from [employer] requesting multiple medical records and a letter from Dr. Genia Harold stating whether patient is able to return to work.   I called [employer] and they need copies of the records listed and a letter from Dr. Genia Harold stating whether patient has been out of work Jan 1 to Nov 29, 2022 and is or is not able to return to work.   If she is able to return to work, are there any restrictions or accommodations needed for her to do her job.   If she cannot return to work, then what is a projected return to work date.  Dr. Gretta Began office notes yesterday say she is going to have a bronchoscopy on Friday.   From other chart notes, it looks like a disability Physician's Statement form was being prepared by Moishe Spice, RN yesterday for [employer].   Also, there is a form in the patient's  Media docs on 11/16/22 from Dr. Rowe Pavy that said she could return to work on Feb 7, provided cleared by oncology  and pain management. That may be what is driving this letter from [employer].  I've emailed this information to [employer], Claudette Head, Moishe Spice and Dr. Genia Harold.  This is not a disability paperwork request that I can take care of.

## 2022-12-01 ENCOUNTER — Encounter
Admission: RE | Disposition: A | Discharge status: Home / Self Care | Payer: Self-pay | Source: Home / Self Care | Attending: Student in an Organized Health Care Education/Training Program

## 2022-12-01 ENCOUNTER — Ambulatory Visit
Admission: RE | Admit: 2022-12-01 | Discharge: 2022-12-01 | Disposition: A | Discharge status: Home / Self Care | Payer: Commercial Managed Care - HMO | Attending: Student in an Organized Health Care Education/Training Program | Admitting: Student in an Organized Health Care Education/Training Program

## 2022-12-01 ENCOUNTER — Inpatient Hospital Stay: Payer: Commercial Managed Care - HMO | Attending: Oncology

## 2022-12-01 ENCOUNTER — Encounter: Payer: Self-pay | Admitting: Oncology

## 2022-12-01 ENCOUNTER — Ambulatory Visit: Payer: Commercial Managed Care - HMO | Admitting: Urgent Care

## 2022-12-01 ENCOUNTER — Other Ambulatory Visit: Payer: Self-pay

## 2022-12-01 ENCOUNTER — Other Ambulatory Visit: Payer: Self-pay | Admitting: *Deleted

## 2022-12-01 ENCOUNTER — Encounter: Payer: Self-pay | Admitting: Student in an Organized Health Care Education/Training Program

## 2022-12-01 VITALS — BP 109/80 | HR 71

## 2022-12-01 DIAGNOSIS — Z79899 Other long term (current) drug therapy: Secondary | ICD-10-CM | POA: Diagnosis not present

## 2022-12-01 DIAGNOSIS — Z170 Estrogen receptor positive status [ER+]: Secondary | ICD-10-CM | POA: Insufficient documentation

## 2022-12-01 DIAGNOSIS — Z5111 Encounter for antineoplastic chemotherapy: Secondary | ICD-10-CM | POA: Insufficient documentation

## 2022-12-01 DIAGNOSIS — Z9221 Personal history of antineoplastic chemotherapy: Secondary | ICD-10-CM | POA: Insufficient documentation

## 2022-12-01 DIAGNOSIS — J9621 Acute and chronic respiratory failure with hypoxia: Secondary | ICD-10-CM | POA: Diagnosis not present

## 2022-12-01 DIAGNOSIS — R918 Other nonspecific abnormal finding of lung field: Secondary | ICD-10-CM | POA: Diagnosis present

## 2022-12-01 DIAGNOSIS — C50412 Malignant neoplasm of upper-outer quadrant of left female breast: Secondary | ICD-10-CM | POA: Diagnosis present

## 2022-12-01 DIAGNOSIS — I890 Lymphedema, not elsewhere classified: Secondary | ICD-10-CM

## 2022-12-01 DIAGNOSIS — Z8041 Family history of malignant neoplasm of ovary: Secondary | ICD-10-CM | POA: Insufficient documentation

## 2022-12-01 DIAGNOSIS — Z853 Personal history of malignant neoplasm of breast: Secondary | ICD-10-CM | POA: Insufficient documentation

## 2022-12-01 DIAGNOSIS — C50212 Malignant neoplasm of upper-inner quadrant of left female breast: Secondary | ICD-10-CM

## 2022-12-01 DIAGNOSIS — J189 Pneumonia, unspecified organism: Secondary | ICD-10-CM

## 2022-12-01 DIAGNOSIS — Z9013 Acquired absence of bilateral breasts and nipples: Secondary | ICD-10-CM | POA: Diagnosis not present

## 2022-12-01 DIAGNOSIS — F17210 Nicotine dependence, cigarettes, uncomplicated: Secondary | ICD-10-CM | POA: Insufficient documentation

## 2022-12-01 HISTORY — PX: FLEXIBLE BRONCHOSCOPY: SHX5094

## 2022-12-01 LAB — BODY FLUID CELL COUNT WITH DIFFERENTIAL
Eos, Fluid: 1 %
Eos, Fluid: 1 %
Lymphs, Fluid: 6 %
Lymphs, Fluid: 4 %
Monocyte-Macrophage-Serous Fluid: 85 % (ref 50–90)
Monocyte-Macrophage-Serous Fluid: 82 % (ref 50–90)
Neutrophil Count, Fluid: 8 % (ref 0–25)
Neutrophil Count, Fluid: 13 % (ref 0–25)
Total Nucleated Cell Count, Fluid: 203 cu mm (ref 0–1000)
Total Nucleated Cell Count, Fluid: 185 cu mm (ref 0–1000)

## 2022-12-01 LAB — SARS CORONAVIRUS 2 (TAT 6-24 HRS): SARS Coronavirus 2: NEGATIVE

## 2022-12-01 SURGERY — BRONCHOSCOPY, FLEXIBLE
Anesthesia: General | Laterality: Bilateral

## 2022-12-01 MED ORDER — PROPOFOL 500 MG/50ML IV EMUL
INTRAVENOUS | Status: DC | PRN
  Administered 2022-12-01: 150 ug/kg/min via INTRAVENOUS

## 2022-12-01 MED ORDER — MIDAZOLAM HCL 2 MG/2ML IJ SOLN
INTRAMUSCULAR | Status: DC | PRN
  Administered 2022-12-01: 2 mg via INTRAVENOUS

## 2022-12-01 MED ORDER — ACETAMINOPHEN 10 MG/ML IV SOLN: 1000.0000 mg | Freq: Once | INTRAVENOUS | Status: DC

## 2022-12-01 MED ORDER — LIDOCAINE HCL (PF) 2 % IJ SOLN
INTRAMUSCULAR | Status: AC
  Filled 2022-12-01: qty 5

## 2022-12-01 MED ORDER — FENTANYL CITRATE (PF) 100 MCG/2ML IJ SOLN: 25.0000 ug | INTRAMUSCULAR | Status: DC | PRN

## 2022-12-01 MED ORDER — MIDAZOLAM HCL 2 MG/2ML IJ SOLN
INTRAMUSCULAR | Status: AC
  Filled 2022-12-01: qty 2

## 2022-12-01 MED ORDER — FENTANYL CITRATE (PF) 100 MCG/2ML IJ SOLN
INTRAMUSCULAR | Status: DC | PRN
  Administered 2022-12-01 (×2): 50 ug via INTRAVENOUS

## 2022-12-01 MED ORDER — DEXAMETHASONE SODIUM PHOSPHATE 10 MG/ML IJ SOLN
INTRAMUSCULAR | Status: DC | PRN
  Administered 2022-12-01: 10 mg via INTRAVENOUS

## 2022-12-01 MED ORDER — ROCURONIUM BROMIDE 10 MG/ML (PF) SYRINGE
PREFILLED_SYRINGE | INTRAVENOUS | Status: AC
  Filled 2022-12-01: qty 10

## 2022-12-01 MED ORDER — LIDOCAINE HCL (CARDIAC) PF 100 MG/5ML IV SOSY
PREFILLED_SYRINGE | INTRAVENOUS | Status: DC | PRN
  Administered 2022-12-01: 40 mg via INTRAVENOUS

## 2022-12-01 MED ORDER — PROPOFOL 10 MG/ML IV BOLUS
INTRAVENOUS | Status: DC | PRN
  Administered 2022-12-01: 130 mg via INTRAVENOUS

## 2022-12-01 MED ORDER — SUCCINYLCHOLINE CHLORIDE 200 MG/10ML IV SOSY
PREFILLED_SYRINGE | INTRAVENOUS | Status: DC | PRN
  Administered 2022-12-01: 80 mg via INTRAVENOUS

## 2022-12-01 MED ORDER — ALBUTEROL SULFATE (2.5 MG/3ML) 0.083% IN NEBU
INHALATION_SOLUTION | RESPIRATORY_TRACT | Status: AC
  Administered 2022-12-01: 2.5 mg via RESPIRATORY_TRACT
  Filled 2022-12-01: qty 3

## 2022-12-01 MED ORDER — ORAL CARE MOUTH RINSE: 15.0000 mL | Freq: Once | OROMUCOSAL | Status: AC

## 2022-12-01 MED ORDER — ALBUTEROL SULFATE (2.5 MG/3ML) 0.083% IN NEBU: 2.5000 mg | INHALATION_SOLUTION | Freq: Four times a day (QID) | RESPIRATORY_TRACT | Status: DC | PRN

## 2022-12-01 MED ORDER — ACETAMINOPHEN 10 MG/ML IV SOLN
INTRAVENOUS | Status: AC
  Filled 2022-12-01: qty 100

## 2022-12-01 MED ORDER — FAMOTIDINE 20 MG PO TABS: 20.0000 mg | ORAL_TABLET | Freq: Once | ORAL | Status: AC

## 2022-12-01 MED ORDER — CHLORHEXIDINE GLUCONATE 0.12 % MT SOLN: 15.0000 mL | Freq: Once | OROMUCOSAL | Status: AC

## 2022-12-01 MED ORDER — GOSERELIN ACETATE 3.6 MG ~~LOC~~ IMPL
3.6000 mg | DRUG_IMPLANT | SUBCUTANEOUS | Status: DC
  Administered 2022-12-01: 3.6 mg via SUBCUTANEOUS
  Filled 2022-12-01: qty 3.6

## 2022-12-01 MED ORDER — ONDANSETRON HCL 4 MG/2ML IJ SOLN: 4.0000 mg | Freq: Once | INTRAMUSCULAR | Status: DC | PRN

## 2022-12-01 MED ORDER — FAMOTIDINE 20 MG PO TABS
ORAL_TABLET | ORAL | Status: AC
  Administered 2022-12-01: 20 mg via ORAL
  Filled 2022-12-01: qty 1

## 2022-12-01 MED ORDER — PROPOFOL 10 MG/ML IV BOLUS
INTRAVENOUS | Status: AC
  Filled 2022-12-01: qty 20

## 2022-12-01 MED ORDER — CHLORHEXIDINE GLUCONATE 0.12 % MT SOLN
OROMUCOSAL | Status: AC
  Administered 2022-12-01: 15 mL via OROMUCOSAL
  Filled 2022-12-01: qty 15

## 2022-12-01 MED ORDER — ONDANSETRON HCL 4 MG/2ML IJ SOLN
INTRAMUSCULAR | Status: DC | PRN
  Administered 2022-12-01: 4 mg via INTRAVENOUS

## 2022-12-01 MED ORDER — LACTATED RINGERS IV SOLN: INTRAVENOUS | Status: DC

## 2022-12-01 MED ORDER — FENTANYL CITRATE (PF) 100 MCG/2ML IJ SOLN
INTRAMUSCULAR | Status: AC
  Filled 2022-12-01: qty 2

## 2022-12-01 NOTE — Anesthesia Postprocedure Evaluation (Signed)
Anesthesia Post Note  Patient: Tricia Berry  Procedure(s) Performed: FLEXIBLE BRONCHOSCOPY (Bilateral)  Patient location during evaluation: PACU Anesthesia Type: General Level of consciousness: awake and awake and alert Pain management: pain level controlled Vital Signs Assessment: post-procedure vital signs reviewed and stable Respiratory status: spontaneous breathing and respiratory function stable Cardiovascular status: stable Anesthetic complications: no  No notable events documented.   Last Vitals:  Vitals:   12/01/22 1306 12/01/22 1428  BP: 114/72 (!) 143/73  Pulse:  89  Resp: 18 16  Temp: 36.4 C 36.4 C  SpO2: 96% 98%    Last Pain:  Vitals:   12/01/22 1430  TempSrc:   PainSc: 0-No pain                 VAN STAVEREN,Quenton Recendez

## 2022-12-01 NOTE — Interval H&P Note (Signed)
S: Feels better, shortness of breath improving. O: Vitals:   12/01/22 1306  BP: 114/72  Resp: 18  Temp: 97.6 F (36.4 C)  SpO2: 96%   General: Well-appearing and in no distress. Well-nourished Eyes: Anicteric, no conjunctival pallor HEENT: Mucous membranes moist, no evidence of postnasal drip Respiratory: Trachea is midline, no respiratory distress, good bilateral air entry. Bilateral rales in the lower lung fields Cardiovascular: Heart with regular rate and rhythm, normal S1 and S2, no murmurs, rubs, or gallops Gastrointestinal: Normoactive bowel sounds, soft and nontender Neuro: Alert and oriented, no gross focal deficits  A&P: 52 year old female with a history of respiratory failure with bilateral multi-focal infiltrates concerning for idiopathic interstitial pneumonia, hypersensitivity pneumonitis, or opportunistic infection. She has been started on steroids and the purpose of today's bronchoscopy is to rule out infectious etiologies before committing her to a long course of steroids. The patient is appropriate for the procedure.  Armando Reichert, MD [facility] 12/01/2022 1:31 PM

## 2022-12-01 NOTE — Op Note (Signed)
Bronchoscopy Procedure Note  Tricia Berry  [id number]  1970/10/26  Date:12/01/22  Time:2:38 PM   Provider Performing:Khyran Riera   Procedure(s):  Flexible bronchoscopy with bronchial alveolar lavage (66599)  Indication(s) Respiratory failure, multi-focal infiltrates  Consent Risks of the procedure as well as the alternatives and risks of each were explained to the patient and/or caregiver.  Consent for the procedure was obtained and is signed in the bedside chart  Anesthesia General with propofol, topical lidocaine (total 9 cc), no paralytics.   Time Out Verified patient identification, verified procedure, site/side was marked, verified correct patient position, special equipment/implants available, medications/allergies/relevant history reviewed, required imaging and test results available.   Sterile Technique Usual hand hygiene, masks, gowns, and gloves were used   Procedure Description Bronchoscope advanced through endotracheal tube and into airway.  Airways were examined down to subsegmental level. No secretions or endobronchial lesions were encountered. Bronchial mucosa looked normal, and there were no anatomic variants.  Following diagnostic evaluation, BAL was performed in two segments.  -BAL in the LB4 section of the right upper lobe, 75 mL instilled, 50 mL returned, cloudy -BAL in the RB3 segment of the right upper lobe, 75 mL instilled, 34 mL returned, cloudy.  Findings: Normal bronchial anatomy, no secretions. BAL performed in LB4 and RB3.   Complications/Tolerance None; patient tolerated the procedure well. Chest X-ray is not needed post procedure.   EBL Minimal   Specimen(s) Samples sent for cell count, culture (bacterial, fungal, AFB, nocardia, actinomyces, PJP smear), aspergillus galactomannan and beta-d-glucan.  Armando Reichert, MD [facility] 12/01/2022 2:41 PM

## 2022-12-01 NOTE — Telephone Encounter (Signed)
Per Ernestene Kiel, RN she faxed completed forms to company

## 2022-12-01 NOTE — Discharge Instructions (Signed)

## 2022-12-01 NOTE — Transfer of Care (Signed)
Immediate Anesthesia Transfer of Care Note  Patient: Tricia Berry  Procedure(s) Performed: FLEXIBLE BRONCHOSCOPY (Bilateral)  Patient Location: PACU  Anesthesia Type:General  Level of Consciousness: drowsy  Airway & Oxygen Therapy: Patient Spontanous Breathing and Patient connected to face mask oxygen  Post-op Assessment: Report given to RN and Post -op Vital signs reviewed and stable  Post vital signs: Reviewed and stable  Last Vitals:  Vitals Value Taken Time  BP 143/73 12/01/22 1428  Temp    Pulse 79 12/01/22 1430  Resp 15 12/01/22 1430  SpO2 99 % 12/01/22 1430  Vitals shown include unvalidated device data.  Last Pain:  Vitals:   12/01/22 1306  TempSrc: Temporal  PainSc: 3          Complications: No notable events documented.

## 2022-12-01 NOTE — Anesthesia Procedure Notes (Signed)
Procedure Name: Intubation Date/Time: 12/01/2022 2:14 PM  Performed by: Lily Peer, Amed Datta, CRNAPre-anesthesia Checklist: Patient identified, Emergency Drugs available, Suction available and Patient being monitored Patient Re-evaluated:Patient Re-evaluated prior to induction Oxygen Delivery Method: Circle system utilized Preoxygenation: Pre-oxygenation with 100% oxygen Induction Type: IV induction Ventilation: Mask ventilation without difficulty Laryngoscope Size: McGraph and 3 Grade View: Grade I Tube type: Oral Number of attempts: 1 Airway Equipment and Method: Stylet Placement Confirmation: ETT inserted through vocal cords under direct vision, positive ETCO2 and breath sounds checked- equal and bilateral Secured at: 20 cm Tube secured with: Tape Dental Injury: Teeth and Oropharynx as per pre-operative assessment

## 2022-12-01 NOTE — Anesthesia Preprocedure Evaluation (Addendum)
Anesthesia Evaluation  Patient identified by MRN, date of birth, ID band Patient awake    Reviewed: Allergy & Precautions, NPO status , Patient's Chart, lab work & pertinent test results  History of Anesthesia Complications (+) PONV and history of anesthetic complications  Airway Mallampati: II  TM Distance: >3 FB Neck ROM: Full    Dental  (+) Upper Dentures, Lower Dentures   Pulmonary shortness of breath, pneumonia, Current Smoker   Pulmonary exam normal  + decreased breath sounds      Cardiovascular Exercise Tolerance: Good negative cardio ROS Normal cardiovascular exam Rhythm:Regular Rate:Normal     Neuro/Psych  Headaches  Anxiety Depression    negative neurological ROS  negative psych ROS   GI/Hepatic negative GI ROS, Neg liver ROS,GERD  Medicated,,  Endo/Other  negative endocrine ROS    Renal/GU negative Renal ROS  negative genitourinary   Musculoskeletal   Abdominal   Peds  Hematology negative hematology ROS (+) Blood dyscrasia, anemia   Anesthesia Other Findings Past Medical History: No date: Anemia No date: Anginal pain (HCC)     Comment:  excertion not cardiac related per patient No date: Anxiety 12/2021: Breast cancer ([facility])     Comment:  left breast IMC No date: Diverticulitis No date: Dyspnea No date: Dysrhythmia     Comment:  prolong QT No date: Family history of ovarian cancer No date: GERD (gastroesophageal reflux disease) No date: Headache No date: IBS (irritable bowel syndrome) No date: Neuromuscular disorder (HCC)     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 NO cervix on exam 01/10/21 No date:  BACK SURGERY 09/15/2020: BREAST BIOPSY; Left     Comment:  [place] bx of mass, path pending, Q marker 09/15/2020: BREAST BIOPSY; Left     Comment:  [place] bx of LN, hydromarker, path pending 03/17/2021: BREAST RECONSTRUCTION WITH PLACEMENT OF TISSUE EXPANDER  AND FLEX HD (ACELLULAR HYDRATED DERMIS); Bilateral     Comment:  Procedure: IMMEDIATE BILATERAL BREAST RECONSTRUCTION               WITH PLACEMENT OF TISSUE EXPANDER AND FLEX HD (ACELLULAR               HYDRATED DERMIS);  Surgeon: Wallace Going, DO;                Location: [facility];  Service: Plastics;               Laterality: Bilateral; 10/18/2022: CAPSULECTOMY; Right     Comment:  Procedure: Right breast capsule release with adjustment;              Surgeon: Wallace Going, DO;  Location: [facility];  Service: Plastics;  Laterality: Right; 11/21/2021: COLONOSCOPY WITH PROPOFOL; N/A     Comment:  Procedure: COLONOSCOPY WITH PROPOFOL;  Surgeon: Lin Landsman, MD;  Location: [facility];  Service:               Gastroenterology;  Laterality: N/A; 10/01/2020: IR IMAGING GUIDED PORT INSERTION 03/17/2021: MODIFIED MASTECTOMY; Left     Comment:  Procedure: LEFT MODIFIED RADICAL MASTECTOMY;  Surgeon:               Erroll Luna, MD;  Location: [facility];  Service: General;  Laterality: Left; 03/17/2021: PORTA CATH REMOVAL; Right     Comment:  Procedure: PORTA CATH REMOVAL;  Surgeon: Erroll Luna, MD;  Location: [facility];                Service: General;  Laterality: Right; 05/09/2021: REMOVAL OF BILATERAL TISSUE EXPANDERS WITH PLACEMENT OF  BILATERAL BREAST IMPLANTS; Bilateral     Comment:  Procedure: REMOVAL OF BILATERAL TISSUE EXPANDERS WITH               PLACEMENT OF BILATERAL BREAST IMPLANTS;  Surgeon:               Wallace Going, DO;  Location: [facility];  Service: Plastics;  Laterality: Bilateral;  90               min 03/17/2021: TOTAL MASTECTOMY; Right     Comment:  Procedure: TOTAL MASTECTOMY;  Surgeon: Erroll Luna,               MD;  Location: [facility];  Service:               General;  Laterality: Right;  BMI    Body Mass Index: 24.41 kg/m      Reproductive/Obstetrics negative OB ROS                              Anesthesia Physical Anesthesia Plan  ASA: 3  Anesthesia Plan: General   Post-op Pain Management:    Induction: Intravenous  PONV Risk Score and Plan: 1 and Ondansetron, Dexamethasone and Propofol infusion  Airway Management Planned: Oral ETT  Additional Equipment:   Intra-op Plan:   Post-operative Plan: Extubation in OR  Informed Consent: I have reviewed the patients History and Physical, chart, labs and discussed the procedure including the risks, benefits and alternatives for the proposed anesthesia with the patient or authorized representative who has indicated his/her understanding and acceptance.     Dental Advisory Given  Plan Discussed with: CRNA and Surgeon  Anesthesia Plan Comments:         Anesthesia Quick Evaluation

## 2022-12-02 ENCOUNTER — Encounter: Payer: Self-pay | Admitting: Student in an Organized Health Care Education/Training Program

## 2022-12-04 ENCOUNTER — Other Ambulatory Visit: Payer: Self-pay

## 2022-12-04 ENCOUNTER — Encounter: Payer: Self-pay | Admitting: Oncology

## 2022-12-04 ENCOUNTER — Inpatient Hospital Stay: Payer: Self-pay | Admitting: Family

## 2022-12-04 ENCOUNTER — Encounter: Payer: Self-pay | Admitting: Student in an Organized Health Care Education/Training Program

## 2022-12-04 LAB — MISC LABCORP TEST (SEND OUT): Labcorp test code: 832599

## 2022-12-04 LAB — CULTURE, RESPIRATORY W GRAM STAIN
Culture: NO GROWTH
Culture: NO GROWTH
Gram Stain: NONE SEEN

## 2022-12-04 LAB — PATHOLOGIST SMEAR REVIEW

## 2022-12-04 MED ORDER — OXYCODONE HCL 5 MG PO TABS
5.0000 mg | ORAL_TABLET | Freq: Four times a day (QID) | ORAL | 0 refills | Status: DC | PRN
  Filled 2022-12-04 (×2): qty 120, 30d supply, fill #0

## 2022-12-05 ENCOUNTER — Ambulatory Visit: Payer: Self-pay | Admitting: *Deleted

## 2022-12-05 ENCOUNTER — Telehealth: Payer: Self-pay | Admitting: *Deleted

## 2022-12-05 ENCOUNTER — Other Ambulatory Visit (HOSPITAL_COMMUNITY): Payer: Self-pay

## 2022-12-05 NOTE — Patient Instructions (Signed)
Visit Information  Thank you for taking time to visit with me today. Please don't hesitate to contact me if I can be of assistance to you.   Following are the goals we discussed today:   Goals Addressed             This Visit's Progress    "I need more information on insurance coverage "       Care Coordination Interventions: Food Stamp application to be mailed to patient's home Patient to consider applying for Medicaid once LT disability through her employer coverage ends-confirmed that patient was denied disability and will consider attorney assistance to re-apply Patient to consider return to work when medically ready Patient to contact the [place] at the [facility] to discuss any possible assistance options          Please call the care guide team at [phone] if you need to cancel or reschedule your appointment.   If you are experiencing a Mental Health or [place] or need someone to talk to, please call 911   Patient verbalizes understanding of instructions and care plan provided today and agrees to view in [place]. Active MyChart status and patient understanding of how to access instructions and care plan via MyChart confirmed with patient.     No further follow up required-patient to call this Education officer, museum with any additional community resource needs  [place], [place] Worker  [facility] Care Management [phone]

## 2022-12-05 NOTE — Patient Outreach (Signed)
  Care Coordination   Follow Up Visit Note   12/05/2022 Name: Tricia Berry MRN: [id number] DOB: 09/16/1971  Tricia Berry is a 52 y.o. year old female who sees Arnett, Yvetta Coder, FNP for primary care. I spoke with  Tricia Berry by phone today.  What matters to the patients health and wellness today?  Financial resources due to limited income due to illness    Goals Addressed             This Visit's Progress    "I need more information on insurance coverage "       Care Coordination Interventions: Food Stamp application to be mailed to patient's home Patient to consider applying for Medicaid once LT disability through her employer coverage ends-confirmed that patient was denied disability and will consider attorney assistance to re-apply Patient to consider return to work when medically ready Patient to contact the [place] at the [facility] to discuss any possible assistance options          SDOH assessments and interventions completed:  No     Care Coordination Interventions:  Yes, provided  Interventions Today    Flowsheet Row Most Recent Value  Chronic Disease   Chronic disease during today's visit Other  [Breast cancer]  General Interventions   General Interventions Discussed/Reviewed Management consultant with Social Work  [employer] social worker]  Level of Arts development officer Other  [food stamp application mailed to patient's home]  Education Interventions   Applications Other  [food stamp application mailed to patient's home]       Follow up plan: No further intervention required.   Encounter Outcome:  Pt. Visit Completed

## 2022-12-05 NOTE — Patient Outreach (Signed)
  Care Coordination   12/05/2022 Name: Tricia Berry MRN: [id number] DOB: 01-14-1971   Care Coordination Outreach Attempts:  An unsuccessful telephone outreach was attempted for a scheduled appointment today.  Follow Up Plan:  Additional outreach attempts will be made to offer the patient care coordination information and services.   Encounter Outcome:  No Answer   Care Coordination Interventions:  No, not indicated    Takiyah Bohnsack, [place] Worker  [facility] Care Management [phone]

## 2022-12-06 ENCOUNTER — Other Ambulatory Visit: Payer: Self-pay | Admitting: Family

## 2022-12-06 ENCOUNTER — Ambulatory Visit (INDEPENDENT_AMBULATORY_CARE_PROVIDER_SITE_OTHER): Payer: Self-pay | Admitting: Family

## 2022-12-06 ENCOUNTER — Telehealth: Payer: Self-pay | Admitting: Family

## 2022-12-06 ENCOUNTER — Ambulatory Visit (INDEPENDENT_AMBULATORY_CARE_PROVIDER_SITE_OTHER): Payer: Self-pay | Admitting: Student

## 2022-12-06 ENCOUNTER — Other Ambulatory Visit: Payer: Self-pay

## 2022-12-06 ENCOUNTER — Encounter: Payer: Self-pay | Admitting: Family

## 2022-12-06 ENCOUNTER — Other Ambulatory Visit: Payer: Self-pay | Admitting: Pharmacist

## 2022-12-06 ENCOUNTER — Encounter: Payer: Self-pay | Admitting: Oncology

## 2022-12-06 VITALS — BP 128/84 | HR 92 | Temp 98.5°F | Ht 61.0 in | Wt 125.8 lb

## 2022-12-06 DIAGNOSIS — Z7952 Long term (current) use of systemic steroids: Secondary | ICD-10-CM

## 2022-12-06 DIAGNOSIS — N651 Disproportion of reconstructed breast: Secondary | ICD-10-CM

## 2022-12-06 DIAGNOSIS — G894 Chronic pain syndrome: Secondary | ICD-10-CM

## 2022-12-06 DIAGNOSIS — R5383 Other fatigue: Secondary | ICD-10-CM

## 2022-12-06 DIAGNOSIS — G620 Drug-induced polyneuropathy: Secondary | ICD-10-CM

## 2022-12-06 DIAGNOSIS — R6884 Jaw pain: Secondary | ICD-10-CM

## 2022-12-06 DIAGNOSIS — J9621 Acute and chronic respiratory failure with hypoxia: Secondary | ICD-10-CM

## 2022-12-06 LAB — B12 AND FOLATE PANEL
Folate: 11 ng/mL (ref >5.9)
Vitamin B-12: >1500 pg/mL — ABNORMAL HIGH (ref 211–911)

## 2022-12-06 LAB — ACID FAST SMEAR (AFB, MYCOBACTERIA): Acid Fast Smear: NEGATIVE

## 2022-12-06 LAB — VITAMIN D 25 HYDROXY (VIT D DEFICIENCY, FRACTURES): VITD: 25.64 ng/mL — ABNORMAL LOW (ref 30.00–100.00)

## 2022-12-06 LAB — TSH: TSH: 1.21 u[IU]/mL (ref 0.35–5.50)

## 2022-12-06 MED ORDER — BLOOD GLUCOSE MONITOR SYSTEM W/DEVICE KIT
1.0000 | PACK | Freq: Three times a day (TID) | 0 refills | Status: DC
  Filled 2022-12-06: qty 1, 1d supply, fill #0

## 2022-12-06 MED ORDER — FREESTYLE LANCETS MISC
1.0000 | Freq: Three times a day (TID) | 0 refills | Status: AC
  Filled 2022-12-06: qty 100, 30d supply, fill #0

## 2022-12-06 MED ORDER — GLUCOSE BLOOD VI STRP
1.0000 | ORAL_STRIP | Freq: Three times a day (TID) | 0 refills | Status: AC
  Filled 2022-12-06: qty 100, 30d supply, fill #0

## 2022-12-06 MED ORDER — LANCET DEVICE MISC
1.0000 | Freq: Three times a day (TID) | 0 refills | Status: AC
  Filled 2022-12-06 – 2022-12-18 (×3): qty 1, 30d supply, fill #0

## 2022-12-06 MED ORDER — CYANOCOBALAMIN 1000 MCG/ML IJ SOLN
1000.0000 ug | INTRAMUSCULAR | 4 refills | Status: DC
  Filled 2022-12-06: qty 3, 90d supply, fill #0
  Filled 2023-04-11: qty 3, 90d supply, fill #1
  Filled 2023-07-17: qty 3, 90d supply, fill #2
  Filled 2023-11-05: qty 3, 90d supply, fill #3

## 2022-12-06 NOTE — Progress Notes (Signed)
Assessment & Plan:  Other fatigue -     TSH -     B12 and Folate Panel -     VITAMIN D 25 Hydroxy (Vit-D Deficiency, Fractures)  Jaw pain Assessment & Plan: Etiology unclear at this time. Patient questions role of herceptin in 2023. Im unaware of incidence of jaw necrosis with herceptin and plan to discuss with Dr Janese Banks.   Normal DEXA 07/2019 Alternatively question in regards to jaw pain, numbness if trigiminal neuralgia, or pain r/t  TMJ.  Pending xr mandible to evaluate for osteonecrosis and plan to share with Dr Janese Banks, heme/onc and Dr Myles Rosenthal, Pain Management Consider EMG testing through neurology ( unless Dr Philips interprets/orders EMG study).  Continue lyrica 150mg  tid.   Orders: -     DG Mandible 4 Views; Future  Acute on chronic respiratory failure with hypoxia [facility]) Assessment & Plan: Shortness of breath with significant clinical improvement since hospitalization.She is not wearing 02 in the office; she has in car. We will call homehealth to request oxygen concentration due to lifting constraints and concern for  chronic hypoxia.   Reviewed hospitalization course with patient.  Pending bronchoscopy results.  Patient compliant with prednisone, atovaquone as prescribed by Dr Genia Harold. Will follow.    Chronic pain syndrome  Current chronic use of systemic steroids -     Blood Glucose Monitoring Suppl; 1 each by Does not apply route in the morning, at noon, and at bedtime. May substitute to any manufacturer covered by patient's insurance.  Dispense: 1 each; Refill: 0  Other orders -     Blood Glucose Test; 1 each by In Vitro route in the morning, at noon, and at bedtime. May substitute to any manufacturer covered by patient's insurance.  Dispense: 100 strip; Refill: 0 -     Lancet Device; 1 each by Does not apply route in the morning, at noon, and at bedtime. May substitute to any manufacturer covered by patient's insurance.  Dispense: 1 each; Refill: 0 -     Lancets Misc.; 1 each  by Does not apply route in the morning, at noon, and at bedtime. May substitute to any manufacturer covered by patient's insurance.  Dispense: 100 each; Refill: 0     Return precautions given.   Risks, benefits, and alternatives of the medications and treatment plan prescribed today were discussed, and patient expressed understanding.   Education regarding symptom management and diagnosis given to patient on AVS either electronically or printed.  No follow-ups on file.  Mable Paris, FNP  Subjective:    Patient ID: Tricia Berry, female    DOB: January 20, 1971, 52 y.o.   MRN: [id number]  CC: Tricia Berry is a 52 y.o. female who presents today for follow up, Follow-up hospitalization.   HPI: She wears 2L o2 when sleeping and with exertion.  She will take of 02 and monitor sa02.   She has completed Rocephin ,Zithromax, and Diflucan  She compliant with prednisone long taper (40 mg for 3 weeks, then 20 mg for 2 weeks, then 10 mg for a week) .She is compliant with Atovaquone 6 week prophylaxis   She established Dr Elta Guadeloupe Philips with [facility] .  Complain of right jaw pain x 6 months. Unchanged. Constant.  Locking sensation. Numbness close to bottom lip.  Wears full set dentures which can make pain worse.   She was on chemotherapy , herceptin 18 doses per patient, finished  6 months ago.     DEXA normal  08/11/21   Patient presented to emergency room 11/21/2022 for acute respiratory failure after I saw her in the office.  Discharged 11/24/22 She was admitted for respiratory failure with hypoxia, sepsis.  Hospital course significant for fever 100.4, elevated lactate 2.3.  Solu-Medrol, IV antibiotics.  Qualify for oxygen at home. Bronchoscopy to be arranged outpatient with Dr Genia Harold Discharged on prednisone 40 mg.  She received Rocephin and azithromycin in the hospital.  Unable to get sputum culture in the hospital.  Provided on Rocephin and Zithromax at discharge.  Atovaquone prophylaxis at discharge.  Diflucan prescribed for thrush GFR > 60  WBC 13 Follow-up pulmonology 11/29/2022.  Respiratory failure, multifocal pneumonia.  Infectious workup has thus far been unrevealing.  Plan for bronchoscopy to rule out bacterial and fungal infection prior to long course of steriods  Bronchoscopy 12/01/2022   Allergies: Morphine, Sertraline hcl, Sulfa antibiotics, Sulfamethoxazole, and Sulfonamide derivatives Current Outpatient Medications on File Prior to Visit  Medication Sig Dispense Refill   acetaminophen (TYLENOL) 650 MG CR tablet Take 1,300 mg by mouth every 8 (eight) hours as needed for pain.     albuterol (PROVENTIL) (2.5 MG/3ML) 0.083% nebulizer solution Take 3 mLs (2.5 mg total) by nebulization every 6 (six) hours as needed for wheezing or shortness of breath. 360 mL 12   albuterol (VENTOLIN HFA) 108 (90 Base) MCG/ACT inhaler Inhale 2 puffs into the lungs every 6 (six) hours as needed for wheezing or shortness of breath. 6.7 g 0   anastrozole (ARIMIDEX) 1 MG tablet Take 1 tablet (1 mg total) by mouth daily. 30 tablet 5   atovaquone (MEPRON) 750 MG/5ML suspension Take 10 mLs (1,500 mg total) by mouth daily. 300 mL 0   b complex vitamins capsule Take 1 capsule by mouth daily.     baclofen (LIORESAL) 10 MG tablet Take 1 tablet (10 mg total) by mouth 3 (three) times daily as needed for muscle spasms. 15 tablet 1   Budeson-Glycopyrrol-Formoterol (BREZTRI AEROSPHERE) 160-9-4.8 MCG/ACT AERO Inhale 2 puffs into the lungs 2 (two) times daily. 10.7 g 11   calcium-vitamin D (OSCAL WITH D) 500-5 MG-MCG tablet Take 1 tablet by mouth daily with breakfast.     cholecalciferol (VITAMIN D3) 25 MCG (1000 UNIT) tablet Take 1,000 Units by mouth daily.     dextromethorphan-guaiFENesin (ROBITUSSIN-DM) 10-100 MG/5ML liquid Take 10 mLs by mouth every 4 (four) hours as needed for cough.     docusate sodium (COLACE) 100 MG capsule Take 100 mg by mouth daily as needed for mild  constipation.     FLUoxetine (PROZAC) 20 MG capsule Take 1 capsule (20 mg total) by mouth every morning. 90 capsule 3   ibuprofen (ADVIL) 400 MG tablet Take 400 mg by mouth every 6 (six) hours as needed.     Lactobacillus (PROBIOTIC ACIDOPHILUS PO) Take 1 capsule by mouth daily.     LORazepam (ATIVAN) 0.5 MG tablet Take 1 tablet (0.5 mg total) by mouth 2 (two) times daily as needed for anxiety. 60 tablet 2   Multiple Vitamins-Minerals (MULTIVITAL PO) Take 1 Dose by mouth daily.     naloxone (NARCAN) nasal spray 4 mg/0.1 mL Place 1 spray into the nose as needed for opioid-induced respiratory depresssion. In case of emergency (overdose), spray once into each nostril. If no response within 3 minutes, repeat application and call 161. 2 each 0   nicotine (NICODERM CQ - DOSED IN MG/24 HOURS) 21 mg/24hr patch use 1 patch daily 28 patch 0   oxyCODONE (OXY IR/ROXICODONE)  5 MG immediate release tablet Take 1 tablet (5 mg total) by mouth 2 (two) times daily. Must last 30 days. 60 tablet 0   oxyCODONE (OXY IR/ROXICODONE) 5 MG immediate release tablet Take 1 tablet (5 mg total) by mouth 4 (four) times daily as needed. 120 tablet 0   predniSONE (DELTASONE) 20 MG tablet Take 2 tablets (40 mg total) by mouth daily until seen by pulmonary to start taper 60 tablet 0   pregabalin (LYRICA) 150 MG capsule Take 1 capsule (150 mg total) by mouth in the morning, at noon, and at bedtime. 90 capsule 2   rosuvastatin (CRESTOR) 5 MG tablet Take 5 mg by mouth daily.     trimethoprim (TRIMPEX) 100 MG tablet Take 1 tablet (100 mg total) by mouth daily. 90 tablet 3   vitamin B-12 (CYANOCOBALAMIN) 100 MCG tablet Take 100 mcg by mouth daily. gummy     Current Facility-Administered Medications on File Prior to Visit  Medication Dose Route Frequency Provider Last Rate Last Admin   acetaminophen (TYLENOL) 325 MG tablet            diphenhydrAMINE (BENADRYL) 25 mg capsule            heparin lock flush 100 unit/mL  500 Units  Intravenous Once Sindy Guadeloupe, MD       Zoledronic Acid (ZOMETA) 4 MG/100ML IVPB             Review of Systems  Constitutional:  Negative for chills and fever.  Respiratory:  Positive for shortness of breath (improved). Negative for cough.   Cardiovascular:  Negative for chest pain and palpitations.  Gastrointestinal:  Negative for nausea and vomiting.  Musculoskeletal:  Positive for back pain.  Neurological:  Positive for numbness.      Objective:    BP 128/84   Pulse 92   Temp 98.5 F (36.9 C) (Oral)   Ht 5\' 1"  (1.549 m)   Wt 125 lb 12.8 oz (57.1 kg)   SpO2 98%   BMI 23.77 kg/m  BP Readings from Last 3 Encounters:  12/06/22 128/84  12/01/22 111/65  12/01/22 109/80   Wt Readings from Last 3 Encounters:  12/06/22 125 lb 12.8 oz (57.1 kg)  12/01/22 125 lb (56.7 kg)  11/29/22 125 lb (56.7 kg)    Physical Exam Vitals reviewed.  Constitutional:      Appearance: She is well-developed.  HENT:     Head:     Jaw: No tenderness, swelling, pain on movement or malocclusion.      Comments: Patient describes pain over right lateral mandible as marked on diagram.  No exquisite pain with palpation.  No edema, bogginess, rash or laceration, increased warmth. No pain with opening or closing. No audible crepitus.  Eyes:     Conjunctiva/sclera: Conjunctivae normal.  Cardiovascular:     Rate and Rhythm: Normal rate and regular rhythm.     Pulses: Normal pulses.     Heart sounds: Normal heart sounds.  Pulmonary:     Effort: Pulmonary effort is normal.     Breath sounds: Normal breath sounds. No wheezing, rhonchi or rales.  Skin:    General: Skin is warm and dry.  Neurological:     Mental Status: She is alert.  Psychiatric:        Speech: Speech normal.        Behavior: Behavior normal.        Thought Content: Thought content normal.    I have spent 40 minutes  with a patient including precharting, exam, reviewing medical records, hospitalization, and discussion plan of  care.

## 2022-12-06 NOTE — Assessment & Plan Note (Signed)
Shortness of breath with significant clinical improvement since hospitalization.She is not wearing 02 in the office; she has in car. We will call homehealth to request oxygen concentration due to lifting constraints and concern for  chronic hypoxia.   Reviewed hospitalization course with patient.  Pending bronchoscopy results.  Patient compliant with prednisone, atovaquone as prescribed by Dr Genia Harold. Will follow.

## 2022-12-06 NOTE — Progress Notes (Signed)
Patient is a 52 year old female with history of breast cancer.  Patient recently underwent right breast capsulectomy for implant repositioning with Dr. Marla Roe on 10/18/2022.  Patient presents to the clinic for postoperative follow-up.  Patient was most recently seen in the clinic on 11/14/2022.  She reported she was healing well from her surgery and had no issues.  She also wanted to discuss nipple areolar tattooing at this visit as well.  On exam, the left breast incision was intact and healed well.  The right breast Steri-Strips were in place.  There is no surrounding erythema or cellulitic changes.  Today, patient reports she is doing okay.  She states that a few weeks ago, she was hospitalized for respiratory failure.  She states that she was in the hospital for 3 days and then discharged.  She is now on home oxygen 2 L via nasal cannula for sleeping and for exertion.  She states that she has been following up closely with pulmonology.  She also reports she is currently on prednisone and will be for the next 6 weeks.  From a surgical standpoint, patient reports she is doing well.  She states that she sometimes experiences swelling in her axillary region, more so after she is active.  Patient also states that she has been experiencing a fairly constant burning/pulling pain to the right lateral aspect of her breast.  She states it helps when she does not have anything rubbing against this area.  She states that it feels a lot like the nerve pain she experiences with her neuropathy.  Patient reports she is a little bothered by the excess tissue in her axillary region bilaterally and by the lack of volume to the medial aspect of the right breast.  She denies any other issues or concerns at this time.  She reports she is coming in next week for nipple areolar tattooing.  Chaperone present on exam.  On exam, patient is sitting upright in no acute distress.  She is currently on 2 L of oxygen via nasal  cannula.  Implants are in place bilaterally.  Breasts are soft bilaterally.  Left breast incision has healed well.  There is no overlying erythema noted or fluid collections palpated on exam.  There is some slight excess tissue noted to the left axillary region.  To the right breast, incision has healed well.  There is no overlying erythema to the right breast.  There is some very mild swelling noted to the axillary region.  There are no fluid collections palpated.  There is some lack of volume noted to the medial aspect of the right breast.  There are no signs of infection on exam.  I discussed with the patient that I would like her to continue to wear her compression at all times given that she is experiencing a little bit of swelling.  I discussed with the patient that she can start gradually increasing her activities very slowly.  Patient reports that she has been seeing physical therapy.  I recommended that she continue to see them.  Patient expressed understanding.  I discussed with the patient that the pain she is describing sounds like a nerve type of pain.  I discussed with the patient that if this pain continues, she should go to her PCP or her neurologist to talk about potential medications for this pain.  Patient states she is already taking Lyrica and has an appointment with her neurologist this Friday.  I recommend that she mention it to  her neurologist as they may be able to adjust her medications.  Patient expressed understanding.  I discussed with the patient that for the lack of volume noted to the medial aspect of the right breast and for the excess tissue that she is bothered by in the axillary regions, she should follow-up with Dr. Marla Roe about.  It was discussed with the patient in the past that fat grafting could be an option.  Patient states that she would maybe want to pursue this, but is not completely sure at this time.  I discussed with the patient that her pulmonary issues will  need to be completely resolved prior to any further surgery from our standpoint.  Patient expressed understanding and was in agreement.  I also discussed with the patient that she may start using scar cream such as Mederma or silicone based scar cream such as Silagen or Skinuva to her scars if she would like.  We will plan to have the patient follow-up with Dr. Marla Roe in 1 month.  I instructed the patient to call if she has any questions or concerns.  Pictures were obtained of the patient and placed in the chart with the patient's or guardian's permission.

## 2022-12-06 NOTE — Patient Instructions (Signed)
I think it is reasonable to check blood glucose fasting in the mornings perhaps once weekly while on extended dose of prednisone.  The goal of fasting blood  glucose is less than 120 and greater than 70.  If persistently greater than 120 when fasting first thing in the morning, please let me know.  Please have xray of mandible done at [facility].

## 2022-12-06 NOTE — Telephone Encounter (Signed)
Call [facility]  Pt needs portable oxygen concentrator  She had breast capsule release 10/18/22 and she cannot lift. She is weight restricted in right arm. Chronic back pain.  Concentrator would be used for multiple doctors appointments.

## 2022-12-06 NOTE — Telephone Encounter (Signed)
Spoke to [employer] and she stated that they need a rx for the Oxygen in as much detail along with ov notes.Judeth Porch provided phone # [phone] and the fax# [phone] for [place] office

## 2022-12-06 NOTE — Assessment & Plan Note (Addendum)
Etiology unclear at this time. Patient questions role of herceptin in 2023. Im unaware of incidence of jaw necrosis with herceptin and plan to discuss with Dr Janese Banks.   Normal DEXA 07/2019 Alternatively question in regards to jaw pain, numbness if trigiminal neuralgia, or pain r/t  TMJ.  Pending xr mandible to evaluate for osteonecrosis and plan to share with Dr Janese Banks, heme/onc and Dr Myles Rosenthal, Pain Management Consider EMG testing through neurology ( unless Dr Philips interprets/orders EMG study).  Continue lyrica 150mg  tid.

## 2022-12-07 ENCOUNTER — Ambulatory Visit (HOSPITAL_BASED_OUTPATIENT_CLINIC_OR_DEPARTMENT_OTHER): Payer: Commercial Managed Care - HMO | Attending: Student in an Organized Health Care Education/Training Program

## 2022-12-07 ENCOUNTER — Other Ambulatory Visit (HOSPITAL_COMMUNITY): Payer: Self-pay

## 2022-12-07 ENCOUNTER — Ambulatory Visit
Admission: RE | Admit: 2022-12-07 | Discharge: 2022-12-07 | Disposition: A | Discharge status: Home / Self Care | Payer: Commercial Managed Care - HMO | Source: Ambulatory Visit | Attending: Family | Admitting: Family

## 2022-12-07 ENCOUNTER — Ambulatory Visit
Admission: RE | Admit: 2022-12-07 | Discharge: 2022-12-07 | Disposition: A | Discharge status: Home / Self Care | Payer: Commercial Managed Care - HMO | Attending: Family | Admitting: Family

## 2022-12-07 DIAGNOSIS — R6884 Jaw pain: Secondary | ICD-10-CM | POA: Diagnosis present

## 2022-12-07 DIAGNOSIS — R0602 Shortness of breath: Secondary | ICD-10-CM | POA: Insufficient documentation

## 2022-12-07 DIAGNOSIS — J449 Chronic obstructive pulmonary disease, unspecified: Secondary | ICD-10-CM | POA: Insufficient documentation

## 2022-12-07 LAB — PULMONARY FUNCTION TEST ARMC ONLY
DL/VA % pred: 58 %
DL/VA: 2.58 ml/min/mmHg/L
DLCO unc % pred: 36 %
DLCO unc: 6.69 ml/min/mmHg
FEF 25-75 Post: 0.86 L/sec
FEF 25-75 Pre: 0.97 L/sec
FEF2575-%Change-Post: -10 %
FEF2575-%Pred-Post: 35 %
FEF2575-%Pred-Pre: 39 %
FEV1-%Change-Post: -6 %
FEV1-%Pred-Post: 48 %
FEV1-%Pred-Pre: 52 %
FEV1-Post: 1.17 L
FEV1-Pre: 1.24 L
FEV1FVC-%Change-Post: -5 %
FEV1FVC-%Pred-Pre: 79 %
FEV6-%Change-Post: 0 %
FEV6-%Pred-Post: 66 %
FEV6-%Pred-Pre: 66 %
FEV6-Post: 1.94 L
FEV6-Pre: 1.95 L
FEV6FVC-%Change-Post: 0 %
FEV6FVC-%Pred-Post: 102 %
FEV6FVC-%Pred-Pre: 102 %
FVC-%Change-Post: 0 %
FVC-%Pred-Post: 64 %
FVC-%Pred-Pre: 64 %
FVC-Post: 1.94 L
FVC-Pre: 1.95 L
Post FEV1/FVC ratio: 60 %
Post FEV6/FVC ratio: 100 %
Pre FEV1/FVC ratio: 64 %
Pre FEV6/FVC Ratio: 100 %
RV % pred: -17 %
RV: -0.28 L
TLC % pred: 35 %
TLC: 1.57 L

## 2022-12-07 MED ORDER — ALBUTEROL SULFATE (2.5 MG/3ML) 0.083% IN NEBU
2.5000 mg | INHALATION_SOLUTION | Freq: Once | RESPIRATORY_TRACT | Status: AC
  Administered 2022-12-07: 2.5 mg via RESPIRATORY_TRACT

## 2022-12-08 ENCOUNTER — Inpatient Hospital Stay (HOSPITAL_BASED_OUTPATIENT_CLINIC_OR_DEPARTMENT_OTHER): Payer: Commercial Managed Care - HMO | Admitting: Internal Medicine

## 2022-12-08 ENCOUNTER — Inpatient Hospital Stay: Payer: Commercial Managed Care - HMO

## 2022-12-08 ENCOUNTER — Encounter: Payer: Self-pay | Admitting: *Deleted

## 2022-12-08 ENCOUNTER — Encounter: Payer: Self-pay | Admitting: Internal Medicine

## 2022-12-08 ENCOUNTER — Other Ambulatory Visit: Payer: Self-pay

## 2022-12-08 ENCOUNTER — Other Ambulatory Visit: Payer: Self-pay | Admitting: *Deleted

## 2022-12-08 VITALS — BP 115/78 | HR 68 | Temp 95.0°F | Resp 17 | Wt 129.0 lb

## 2022-12-08 DIAGNOSIS — G620 Drug-induced polyneuropathy: Secondary | ICD-10-CM

## 2022-12-08 DIAGNOSIS — C50212 Malignant neoplasm of upper-inner quadrant of left female breast: Secondary | ICD-10-CM

## 2022-12-08 DIAGNOSIS — I2541 Coronary artery aneurysm: Secondary | ICD-10-CM

## 2022-12-08 DIAGNOSIS — G939 Disorder of brain, unspecified: Secondary | ICD-10-CM | POA: Insufficient documentation

## 2022-12-08 DIAGNOSIS — Z5111 Encounter for antineoplastic chemotherapy: Secondary | ICD-10-CM | POA: Diagnosis not present

## 2022-12-08 DIAGNOSIS — T451X5A Adverse effect of antineoplastic and immunosuppressive drugs, initial encounter: Secondary | ICD-10-CM | POA: Diagnosis not present

## 2022-12-08 DIAGNOSIS — E875 Hyperkalemia: Secondary | ICD-10-CM

## 2022-12-08 DIAGNOSIS — Z170 Estrogen receptor positive status [ER+]: Secondary | ICD-10-CM

## 2022-12-08 LAB — CBC WITH DIFFERENTIAL/PLATELET
Abs Immature Granulocytes: 0.18 10*3/uL — ABNORMAL HIGH (ref 0.00–0.07)
Basophils Absolute: 0 10*3/uL (ref 0.0–0.1)
Basophils Relative: 0 %
Eosinophils Absolute: 0 10*3/uL (ref 0.0–0.5)
Eosinophils Relative: 0 %
HCT: 42.3 % (ref 36.0–46.0)
Hemoglobin: 13.6 g/dL (ref 12.0–15.0)
Immature Granulocytes: 1 %
Lymphocytes Relative: 14 %
Lymphs Abs: 2 10*3/uL (ref 0.7–4.0)
MCH: 30.3 pg (ref 26.0–34.0)
MCHC: 32.2 g/dL (ref 30.0–36.0)
MCV: 94.2 fL (ref 80.0–100.0)
Monocytes Absolute: 0.7 10*3/uL (ref 0.1–1.0)
Monocytes Relative: 5 %
Neutro Abs: 11.4 10*3/uL — ABNORMAL HIGH (ref 1.7–7.7)
Neutrophils Relative %: 80 %
Platelets: 205 10*3/uL (ref 150–400)
RBC: 4.49 MIL/uL (ref 3.87–5.11)
RDW: 15.1 % (ref 11.5–15.5)
WBC: 14.3 10*3/uL — ABNORMAL HIGH (ref 4.0–10.5)
nRBC: 0 % (ref 0.0–0.2)

## 2022-12-08 LAB — CMP (CANCER CENTER ONLY)
ALT: 27 U/L (ref 0–44)
AST: 20 U/L (ref 15–41)
Albumin: 3.9 g/dL (ref 3.5–5.0)
Alkaline Phosphatase: 55 U/L (ref 38–126)
Anion gap: 8 (ref 5–15)
BUN: 11 mg/dL (ref 6–20)
CO2: 27 mmol/L (ref 22–32)
Calcium: 9.5 mg/dL (ref 8.9–10.3)
Chloride: 102 mmol/L (ref 98–111)
Creatinine: 0.84 mg/dL (ref 0.44–1.00)
GFR, Estimated: >60 mL/min (ref >60)
Glucose, Bld: 127 mg/dL — ABNORMAL HIGH (ref 70–99)
Potassium: 5.5 mmol/L — ABNORMAL HIGH (ref 3.5–5.1)
Sodium: 137 mmol/L (ref 135–145)
Total Bilirubin: 0.5 mg/dL (ref 0.3–1.2)
Total Protein: 7.1 g/dL (ref 6.5–8.1)

## 2022-12-08 MED ORDER — SODIUM CHLORIDE 0.9 % IV SOLN
INTRAVENOUS | Status: DC
  Filled 2022-12-08: qty 250

## 2022-12-08 MED ORDER — BACLOFEN 10 MG PO TABS
10.0000 mg | ORAL_TABLET | Freq: Three times a day (TID) | ORAL | 2 refills | Status: DC | PRN
  Filled 2022-12-08 – 2022-12-17 (×2): qty 60, 20d supply, fill #0
  Filled 2023-01-21: qty 60, 20d supply, fill #1
  Filled 2023-02-21: qty 60, 20d supply, fill #2

## 2022-12-08 MED ORDER — ZOLEDRONIC ACID 4 MG/100ML IV SOLN
4.0000 mg | INTRAVENOUS | Status: DC
  Administered 2022-12-08: 4 mg via INTRAVENOUS
  Filled 2022-12-08: qty 100

## 2022-12-08 NOTE — Progress Notes (Signed)
No new changes today, patient states headaches improved since being on oxygen

## 2022-12-08 NOTE — Progress Notes (Signed)
[facility] at [address] [place], [place] [address] [phone]   Interval Evaluation  Date of Service: 12/08/22 Patient Name: Tricia Berry Patient MRN: [id number] Patient DOB: 1971/05/10 Provider: Ventura Sellers, MD  Identifying Statement:  ZYKIRIA BRUENING is a 52 y.o. female with 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[place])  Brain lesion   Primary Cancer:  Oncologic History: Oncology History  Malignant neoplasm of left female breast ([place])  09/26/2020 Initial Diagnosis   Malignant neoplasm of upper-outer quadrant of left breast in female, estrogen receptor positive ([place])   10/08/2020 - 02/18/2021 Chemotherapy         10/08/2020 Cancer Staging   Staging form: Breast, AJCC 8th Edition - Clinical stage from 10/08/2020: Stage IIIB (cT4, cN1, cM0, G2, ER+, PR+, HER2-) - Signed by Sindy Guadeloupe, MD on 10/08/2020    Genetic Testing   Negative genetic testing. No pathogenic variants identified on the [facility] Common Hereditary Cancers Panel. The report date is 11/18/2020.  The Multi-Cancer Panel offered by [facility] includes sequencing and/or deletion duplication testing of the following 84 genes: AIP, ALK, APC, ATM, AXIN2,BAP1,  BARD1, BLM, BMPR1A, BRCA1, BRCA2, BRIP1, CASR, CDC73, CDH1, CDK4, CDKN1B, CDKN1C, CDKN2A (p14ARF), CDKN2A (p16INK4a), CEBPA, CHEK2, CTNNA1, DICER1, DIS3L2, EGFR (c.2369C>T, p.Thr790Met variant only), EPCAM (Deletion/duplication testing only), FH, FLCN, GATA2, GPC3, GREM1 (Promoter region deletion/duplication testing only), HOXB13 (c.251G>A, p.Gly84Glu), HRAS, KIT, MAX, MEN1, MET, MITF (c.952G>A, p.Glu318Lys variant only), MLH1, MSH2, MSH3, MSH6, MUTYH, NBN, NF1, NF2, NTHL1, PALB2, PDGFRA, PHOX2B, PMS2, POLD1, POLE, POT1, PRKAR1A, PTCH1, PTEN, RAD50, RAD51C, RAD51D, RB1, RECQL4, RET,  RUNX1, SDHAF2, SDHA (sequence changes only), SDHB, SDHC, SDHD, SMAD4, SMARCA4, SMARCB1, SMARCE1, STK11, SUFU, TERC, TERT, TMEM127, TP53,  TSC1, TSC2, VHL, WRN and WT1.    04/11/2021 Cancer Staging   Staging form: Breast, AJCC 8th Edition - Pathologic stage from 04/11/2021: No Stage Recommended (ypT2, pN2a, cM0, G2, ER+, PR+, HER2+) - Signed by Sindy Guadeloupe, MD on 04/11/2021 Stage prefix: Post-therapy Histologic grading system: 3 grade system   04/27/2021 - 06/10/2021 Chemotherapy         07/01/2021 - 06/07/2022 Chemotherapy   Patient is on Treatment Plan : BREAST Weekly Paclitaxel + Trastuzumab + Pertuzumab q21d x 8 cycles / Trastuzumab + Pertuzumab q21d x 4 cycles     06/28/2022 -  Chemotherapy   Patient is on Treatment Plan : BREAST Trastuzumab  + Pertuzumab q21d x 13 cycles       Interval History: Moniqua Engebretsen Hyneman presents today for follow up after recent MRI brain.  Neuropathy is stable overall.  Complains of difficulty manipulating her phone and opening jars.  Currently on oxygen for respiratory failure of unclear etiology, completed broch last week.  No other new or progressive deficits.  Continues to describe brain fog, short term memory lapses as prior.  H+P (06/02/22) Patient presents today to review brain MRI, as well as neuropathic symptoms.  She describes >1 year history of tingling, pain, burning, cold sensitivity affecting her feet and hands.  Pain is severe in particular at night, with burning in her left big toe keeping her awake.  In addition she describes more chronic aching pain in her lower back.  She also complains of feelings of imbalance, but no falls and no walking aid.  This does not appear to be getting worse in recent months since taxol was discontinued.   She otherwise denies focal neurologic deficits, no focal weakness, numbness, double vision, slurred speech.  No seizures.  Medications: Current Outpatient Medications on File Prior to Visit  Medication Sig Dispense Refill   acetaminophen (TYLENOL) 650 MG CR tablet Take 1,300 mg by mouth every 8 (eight) hours as needed for pain.     albuterol  (PROVENTIL) (2.5 MG/3ML) 0.083% nebulizer solution Take 3 mLs (2.5 mg total) by nebulization every 6 (six) hours as needed for wheezing or shortness of breath. 360 mL 12   albuterol (VENTOLIN HFA) 108 (90 Base) MCG/ACT inhaler Inhale 2 puffs into the lungs every 6 (six) hours as needed for wheezing or shortness of breath. 6.7 g 0   anastrozole (ARIMIDEX) 1 MG tablet Take 1 tablet (1 mg total) by mouth daily. 30 tablet 5   atovaquone (MEPRON) 750 MG/5ML suspension Take 10 mLs (1,500 mg total) by mouth daily. 300 mL 0   b complex vitamins capsule Take 1 capsule by mouth daily.     baclofen (LIORESAL) 10 MG tablet Take 1 tablet (10 mg total) by mouth 3 (three) times daily as needed for muscle spasms. 15 tablet 1   Blood Glucose Monitoring Suppl (BLOOD GLUCOSE MONITOR SYSTEM) w/Device KIT Test in the morning, at noon, and at bedtime. 1 kit 0   Budeson-Glycopyrrol-Formoterol (BREZTRI AEROSPHERE) 160-9-4.8 MCG/ACT AERO Inhale 2 puffs into the lungs 2 (two) times daily. 10.7 g 11   calcium-vitamin D (OSCAL WITH D) 500-5 MG-MCG tablet Take 1 tablet by mouth daily with breakfast.     cholecalciferol (VITAMIN D3) 25 MCG (1000 UNIT) tablet Take 1,000 Units by mouth daily.     cyanocobalamin (VITAMIN B12) 1000 MCG/ML injection Inject 1 mL (1,000 mcg total) into the muscle every 30 (thirty) days. 3 mL 4   dextromethorphan-guaiFENesin (ROBITUSSIN-DM) 10-100 MG/5ML liquid Take 10 mLs by mouth every 4 (four) hours as needed for cough.     docusate sodium (COLACE) 100 MG capsule Take 100 mg by mouth daily as needed for mild constipation.     FLUoxetine (PROZAC) 20 MG capsule Take 1 capsule (20 mg total) by mouth every morning. 90 capsule 3   glucose blood test strip Test in the morning, at noon, and at bedtime 100 each 0   ibuprofen (ADVIL) 400 MG tablet Take 400 mg by mouth every 6 (six) hours as needed.     Lactobacillus (PROBIOTIC ACIDOPHILUS PO) Take 1 capsule by mouth daily.     Lancet Device MISC 1 each by  Does not apply route in the morning, at noon, and at bedtime. May substitute to any manufacturer covered by patient's insurance. 1 each 0   Lancets (FREESTYLE) lancets Test in the morning, at noon, and at bedtime. 100 each 0   LORazepam (ATIVAN) 0.5 MG tablet Take 1 tablet (0.5 mg total) by mouth 2 (two) times daily as needed for anxiety. 60 tablet 2   Multiple Vitamins-Minerals (MULTIVITAL PO) Take 1 Dose by mouth daily.     naloxone (NARCAN) nasal spray 4 mg/0.1 mL Place 1 spray into the nose as needed for opioid-induced respiratory depresssion. In case of emergency (overdose), spray once into each nostril. If no response within 3 minutes, repeat application and call 144. 2 each 0   nicotine (NICODERM CQ - DOSED IN MG/24 HOURS) 21 mg/24hr patch use 1 patch daily 28 patch 0   oxyCODONE (OXY IR/ROXICODONE) 5 MG immediate release tablet Take 1 tablet (5 mg total) by mouth 2 (two) times daily. Must last 30 days. 60 tablet 0   oxyCODONE (OXY IR/ROXICODONE) 5 MG immediate release tablet Take 1 tablet (5  mg total) by mouth 4 (four) times daily as needed. 120 tablet 0   predniSONE (DELTASONE) 20 MG tablet Take 2 tablets (40 mg total) by mouth daily until seen by pulmonary to start taper 60 tablet 0   pregabalin (LYRICA) 150 MG capsule Take 1 capsule (150 mg total) by mouth in the morning, at noon, and at bedtime. 90 capsule 2   rosuvastatin (CRESTOR) 5 MG tablet Take 5 mg by mouth daily.     trimethoprim (TRIMPEX) 100 MG tablet Take 1 tablet (100 mg total) by mouth daily. 90 tablet 3   vitamin B-12 (CYANOCOBALAMIN) 100 MCG tablet Take 100 mcg by mouth daily. gummy     Current Facility-Administered Medications on File Prior to Visit  Medication Dose Route Frequency Provider Last Rate Last Admin   acetaminophen (TYLENOL) 325 MG tablet            diphenhydrAMINE (BENADRYL) 25 mg capsule            heparin lock flush 100 unit/mL  500 Units Intravenous Once Sindy Guadeloupe, MD       Zoledronic Acid (ZOMETA) 4  MG/100ML IVPB             Allergies:  Allergies  Allergen Reactions   Morphine Nausea And Vomiting and Other (See Comments)    migranes Other reaction(s): Headache Migraine, vomiting   Sertraline Hcl     REACTION: Worsened symptoms of IBS   Sulfa Antibiotics Rash   Sulfamethoxazole Rash   Sulfonamide Derivatives Rash   Past Medical History:  Past Medical History:  Diagnosis Date   Anemia    Anginal pain (HCC)    excertion not cardiac related per patient   Anxiety    Breast cancer ([place]) 12/2021   left breast IMC   Diverticulitis    Dyspnea    Dysrhythmia    prolong QT   Family history of ovarian cancer    GERD (gastroesophageal reflux disease)    Headache    IBS (irritable bowel syndrome)    Neuromuscular disorder (HCC)    neuropathy from chemo   Pneumonia    PONV (postoperative nausea and vomiting)    severe migraine and vomiting post anesthesia   Scoliosis    Past Surgical History:  Past Surgical History:  Procedure Laterality Date   ABDOMINAL HYSTERECTOMY     still has ovaries, no gyn cancer, hysterectomy due to endometriosis. NO cervix on exam 01/10/21   BACK SURGERY     BREAST BIOPSY Left 09/15/2020   [place] bx of mass, path pending, Q marker   BREAST BIOPSY Left 09/15/2020   [place] bx of LN, hydromarker, path pending   BREAST RECONSTRUCTION WITH PLACEMENT OF TISSUE EXPANDER AND FLEX HD (ACELLULAR HYDRATED DERMIS) Bilateral 03/17/2021   Procedure: IMMEDIATE BILATERAL BREAST RECONSTRUCTION WITH PLACEMENT OF TISSUE EXPANDER AND FLEX HD (ACELLULAR HYDRATED DERMIS);  Surgeon: Wallace Going, DO;  Location: [facility];  Service: Plastics;  Laterality: Bilateral;   CAPSULECTOMY Right 10/18/2022   Procedure: Right breast capsule release with adjustment;  Surgeon: Wallace Going, DO;  Location: [facility];  Service: Plastics;  Laterality: Right;   COLONOSCOPY WITH PROPOFOL N/A 11/21/2021   Procedure: COLONOSCOPY WITH PROPOFOL;   Surgeon: Lin Landsman, MD;  Location: [facility] ENDOSCOPY;  Service: Gastroenterology;  Laterality: N/A;   FLEXIBLE BRONCHOSCOPY Bilateral 12/01/2022   Procedure: FLEXIBLE BRONCHOSCOPY;  Surgeon: Armando Reichert, MD;  Location: [facility] ORS;  Service: Pulmonary;  Laterality: Bilateral;   IR  IMAGING GUIDED PORT INSERTION  10/01/2020   MODIFIED MASTECTOMY Left 03/17/2021   Procedure: LEFT MODIFIED RADICAL MASTECTOMY;  Surgeon: Erroll Luna, MD;  Location: [facility];  Service: General;  Laterality: Left;   PORTA CATH REMOVAL Right 03/17/2021   Procedure: PORTA CATH REMOVAL;  Surgeon: Erroll Luna, MD;  Location: [facility];  Service: General;  Laterality: Right;   REMOVAL OF BILATERAL TISSUE EXPANDERS WITH PLACEMENT OF BILATERAL BREAST IMPLANTS Bilateral 05/09/2021   Procedure: REMOVAL OF BILATERAL TISSUE EXPANDERS WITH PLACEMENT OF BILATERAL BREAST IMPLANTS;  Surgeon: Wallace Going, DO;  Location: [facility];  Service: Plastics;  Laterality: Bilateral;  90 min   TOTAL MASTECTOMY Right 03/17/2021   Procedure: TOTAL MASTECTOMY;  Surgeon: Erroll Luna, MD;  Location: [facility];  Service: General;  Laterality: Right;   Social History:  Social History   Socioeconomic History   Marital status: Significant Other    Spouse name: Not on file   Number of children: 2   Years of education: Not on file   Highest education level: Not on file  Occupational History   Occupation: Nurse    Employer: [employer]  Tobacco Use   Smoking status: Every Day    Packs/day: 1.00    Years: 20.00    Total pack years: 20.00    Types: Cigarettes   Smokeless tobacco: Never   Tobacco comments:    5 cigarettes daily-11/29/2022  Vaping Use   Vaping Use: Never used  Substance and Sexual Activity   Alcohol use: Not Currently   Drug use: No   Sexual activity: Not Currently    Birth control/protection: Surgical    Comment: hyst  Other Topics  Concern   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 She and her spouse are primary caregivers.    Social Determinants of Health   Financial Resource Strain: Not on file  Food Insecurity: No Food Insecurity (11/21/2022)   Hunger Vital Sign    Worried About Running Out of Food in the Last Year: Never true    Ran Out of Food in the Last Year: Never true  Transportation Needs: No Transportation Needs (11/21/2022)   PRAPARE - Hydrologist (Medical): No    Lack of Transportation (Non-Medical): No  Physical Activity: Not on file  Stress: Not on file  Social Connections: Not on file  Intimate Partner Violence: Not At Risk (11/21/2022)   Humiliation, Afraid, Rape, and Kick questionnaire    Fear of Current or Ex-Partner: No    Emotionally Abused: No    Physically Abused: No    Sexually Abused: No   Family History:  Family History  Problem Relation Age of Onset   Hypertension Mother    Osteoarthritis Mother    Diverticulitis Mother    Heart failure Father    Hypertension Father    Gout Father    Heart attack Father 37   Diverticulitis Brother    Ovarian cancer Paternal Grandmother     Review of Systems: Constitutional: Doesn't report fevers, chills or abnormal weight loss Eyes: Doesn't report blurriness of vision Ears, nose, mouth, throat, and face: Doesn't report sore throat Respiratory: Doesn't report cough, dyspnea or wheezes Cardiovascular: Doesn't report palpitation, chest discomfort  Gastrointestinal:  Doesn't report nausea, constipation, diarrhea GU: Doesn't report incontinence Skin: Doesn't report skin rashes Neurological:  Per HPI Musculoskeletal: Doesn't report joint pain Behavioral/Psych: ++anxiety  Physical Exam: There were no vitals filed for this visit.  KPS: 90. General: Alert, cooperative, pleasant, in no acute distress Head: Normal EENT: No  conjunctival injection or scleral icterus.  Lungs: Resp effort normal Cardiac: Regular rate Abdomen: Non-distended abdomen Skin: No rashes cyanosis or petechiae. Extremities: No clubbing or edema  Neurologic Exam: Mental Status: Awake, alert, attentive to examiner. Oriented to self and environment. Language is fluent with intact comprehension.  Cranial Nerves: Visual acuity is grossly normal. Visual fields are full. Extra-ocular movements intact. No ptosis. Face is symmetric Motor: Tone and bulk are normal. Power is full in both arms and legs. Reflexes are diminished, no pathologic reflexes present.  Sensory: Stocking neuropathy changes Gait: Normal.   Labs: I have reviewed the data as listed    Component Value Date/Time   NA 139 11/24/2022 0212   K 4.4 11/24/2022 0212   CL 106 11/24/2022 0212   CO2 27 11/24/2022 0212   GLUCOSE 193 (H) 11/24/2022 0212   BUN 10 11/24/2022 0212   CREATININE 0.63 11/24/2022 0212   CALCIUM 8.8 (L) 11/24/2022 0212   PROT 7.2 11/21/2022 1342   ALBUMIN 3.8 11/21/2022 1342   AST 44 (H) 11/21/2022 1342   ALT 41 11/21/2022 1342   ALKPHOS 66 11/21/2022 1342   BILITOT 0.7 11/21/2022 1342   GFRNONAA >60 11/24/2022 0212   GFRAA >90 05/09/2012 1731   Lab Results  Component Value Date   WBC 13.0 (H) 11/24/2022   NEUTROABS 7.3 11/21/2022   HGB 11.5 (L) 11/24/2022   HCT 36.0 11/24/2022   MCV 94.0 11/24/2022   PLT 180 11/24/2022   Imaging:  [facility] Clinician Interpretation: I have personally reviewed the CNS images as listed.  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 EXAM: MRI HEAD WITHOUT AND WITH CONTRAST TECHNIQUE: Multiplanar, multiecho pulse sequences of the brain and surrounding structures were obtained without and with intravenous contrast. CONTRAST:  67mL GADAVIST GADOBUTROL 1 MMOL/ML IV  SOLN COMPARISON:  MRI of the brain July 28, 2022. Head CT November 21, 2018. FINDINGS: Brain: No acute infarction, hemorrhage, hydrocephalus or extra-axial collection. Punctate focus of contrast enhancement in the medial right frontal lobe (series 19, image 16) appear similar to prior MRI. No other focus of abnormal contrast enhancement identified. Scattered foci of T2 hyperintensity within the white matter of the cerebral or and confluent T2 hyperintensity within the pons, nonspecific, similar to prior MRI. 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 Skull and upper cervical spine: Normal marrow signal. Sinuses/Orbits: Negative. Other: None. IMPRESSION: 1. Stable punctate focus of contrast enhancement in the medial right frontal lobe which could represent a tiny cortical metastasis. No new focus of abnormal contrast enhancement identified. 2. 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 MR angiogram of the head recommended. 3. Moderate chronic microvascular ischemic changes of the white matter, similar to prior MRI. Electronically Signed   By: Pedro Earls M.D.   On: 11/28/2022 16:46   CT Angio Chest PE W and/or Wo Contrast  Result Date: 11/21/2022 CLINICAL DATA:  Headache with cough, shortness of breath and chills. EXAM: CT ANGIOGRAPHY CHEST WITH CONTRAST TECHNIQUE: Multidetector CT imaging of the chest was performed using the standard protocol during bolus  administration of intravenous contrast. Multiplanar CT image reconstructions and MIPs were obtained to evaluate the vascular anatomy. RADIATION DOSE REDUCTION: This exam was performed according to the departmental dose-optimization program which includes automated exposure control, adjustment of the mA and/or kV according to patient size and/or use of iterative reconstruction technique. CONTRAST:  47mL OMNIPAQUE IOHEXOL 350 MG/ML SOLN COMPARISON:  August 30, 2022 FINDINGS: Cardiovascular: The thoracic aorta is normal in appearance. Satisfactory opacification of the pulmonary arteries to the segmental level. No evidence of pulmonary embolism. Normal heart size. No pericardial effusion. Mediastinum/Nodes: A predominantly stable 14 mm precarinal lymph node is seen. Thyroid gland, trachea, and esophagus demonstrate no significant findings. Lungs/Pleura: Bilateral, moderate severity multifocal infiltrates are seen. There is no evidence of a pleural effusion or pneumothorax. Upper Abdomen: No acute abnormality. Musculoskeletal: Bilateral breast implants are seen. No acute osseous abnormalities are identified. Review of the MIP images confirms the above findings. IMPRESSION: 1. No evidence of pulmonary embolism. 2. Bilateral, moderate severity multifocal infiltrates. Electronically Signed   By: Virgina Norfolk M.D.   On: 11/21/2022 17:10   CT HEAD WO CONTRAST (5MM)  Result Date: 11/21/2022 CLINICAL DATA:  Headache with fever, shortness of breath and chills. EXAM: CT HEAD WITHOUT CONTRAST TECHNIQUE: Contiguous axial images were obtained from the base of the skull through the vertex without intravenous contrast. RADIATION DOSE REDUCTION: This exam was performed according to the departmental dose-optimization program which includes automated exposure control, adjustment of the mA and/or kV according to patient size and/or use of iterative reconstruction technique. COMPARISON:  None Available. FINDINGS: Brain: No evidence of acute infarction, hemorrhage, hydrocephalus, extra-axial collection or mass lesion/mass effect. Vascular: No hyperdense vessel or unexpected calcification. Skull: Normal. Negative for fracture or focal lesion. Sinuses/Orbits: No acute finding. Other: None. IMPRESSION: No acute intracranial pathology. Electronically Signed   By: Virgina Norfolk M.D.   On: 11/21/2022 17:03   DG Chest 2 View  Result Date: 11/21/2022 CLINICAL DATA:  Cough and SOB  EXAM: CHEST - 2 VIEW COMPARISON:  10/10/22. FINDINGS: The heart size and mediastinal contours are within normal limits. Patchy bilateral lung opacities again seen. Slightly increased at the right lung base. The visualized skeletal structures are unremarkable. Surgical changes along the left axilla. Curvature of the spine IMPRESSION: Increasing patchy lung opacities. Electronically Signed   By: Jill Side M.D.   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  Etiology is exposure to taxol chemotherapy.  She will con't Lyrica 150mg  TID as prior.  Will con't 5mg  baclofen TID prn for spasticity symptoms.  MRI brain demonstrates stable enhancing right frontal lesion.  Etiology is metastasis vs non-specific enhancement.  Will recommend repeating MRI brain in 6 months for further characterization.  Will add MRA head given incidental small aneurysm.  This is visible on prior studies as well but was not commented on.  We appreciate the opportunity to participate in the care of [place].   We ask that AUTRY DROEGE return to clinic in 6 months following next brain MRI, or sooner as needed.  All questions were answered. The patient knows to call the clinic with any problems, questions or concerns. No barriers to learning were detected.  The total time spent in the encounter was 40 minutes and more than 50% was on counseling and review of test results  Ventura Sellers, MD Medical Director of Neuro-Oncology [facility] at [place] 12/08/22 10:40 AM

## 2022-12-11 ENCOUNTER — Encounter: Payer: 59 | Admitting: Pain Medicine

## 2022-12-11 ENCOUNTER — Ambulatory Visit: Payer: 59

## 2022-12-11 ENCOUNTER — Telehealth: Payer: Self-pay | Admitting: Student in an Organized Health Care Education/Training Program

## 2022-12-11 ENCOUNTER — Other Ambulatory Visit (HOSPITAL_COMMUNITY): Payer: Self-pay

## 2022-12-11 DIAGNOSIS — J9621 Acute and chronic respiratory failure with hypoxia: Secondary | ICD-10-CM

## 2022-12-11 NOTE — Telephone Encounter (Signed)
Patient returning call for results- please call back.

## 2022-12-11 NOTE — Telephone Encounter (Signed)
Lm for patient.  

## 2022-12-11 NOTE — Telephone Encounter (Signed)
Spoke to patient. She is requesting PFT and bronch results.  Dr. Genia Harold, please advise. Thanks

## 2022-12-12 ENCOUNTER — Other Ambulatory Visit: Payer: Self-pay | Admitting: Radiation Therapy

## 2022-12-12 ENCOUNTER — Telehealth: Payer: Self-pay | Admitting: Family

## 2022-12-12 ENCOUNTER — Other Ambulatory Visit: Payer: Self-pay

## 2022-12-12 LAB — FUNGITELL BETA-D-GLUCAN

## 2022-12-12 LAB — ASPERGILLUS ANTIGEN, BAL/SERUM: Aspergillus Ag, BAL/Serum: 0.07 Index (ref 0.00–0.49)

## 2022-12-12 NOTE — Telephone Encounter (Signed)
Pt. Called back told her the note about [facility] closing encounter

## 2022-12-12 NOTE — Telephone Encounter (Signed)
POC eval has been placed to [facility]. Lm to make patient aware.

## 2022-12-12 NOTE — Telephone Encounter (Signed)
Spoke to pt and informed her that order has been placed for portable oxygen with [facility] and as soon as they make a decision they will contact her and our office  as well. Pt verbalized understanding

## 2022-12-12 NOTE — Telephone Encounter (Signed)
[facility] pool-  please let pt know we are working on request.    Hi margie,   I was hoping you could help me with pt request for oxygen concentrator as o2 is prescribed by Dr Genia Harold.   She had breast capsule release 10/18/22 and she cannot lift regular tank. She is weight restricted in right arm. This is further complicated by chronic back pain.  Concentrator would be used for multiple doctors appointments. She was unable to carry oxygen into office appointment with me due to weight. Concerned for safety.   Would you be able to send prescription and Dr Gretta Began latest OV notes to  [facility] below?   [facility]  phone # [phone] and the fax# [phone] for [place] office

## 2022-12-12 NOTE — Telephone Encounter (Signed)
The order has been faxed to the fax # provided by the patient [phone]. I have received confirmation that the fax was received

## 2022-12-12 NOTE — Progress Notes (Signed)
12/13/2022 5:12 PM   Tricia Berry 10/14/1971 [id number]  Referring provider: Burnard Hawthorne, FNP [address] [place],  [place] [address]  Urological history: 1.  High risk hematuria -Smoker -CTU (11/2021) -no worrisome findings -cysto (11/2021) - normal urethral meatus with very slight stenosis externally  -no reports of gross heme -UA 11-30 RBC's   2. rUTI's -Contribute factors of age, vaginal atrophy and IBS -Documented positive urine cultures over the last year  None -Trimethoprim 100 mg daily  3. OAB -Contributing factors of age, vaginal atrophy, IBS, smoking, hypertension and pelvic surgery -PVR 175 mL    Chief Complaint  Patient presents with   Recurrent UTI    HPI: Tricia Berry is a 52 y.o. female who presents today for follow up.   She has been experiencing 8 or more daytime voids, 1-2 nocturia and a strong urge to urinate.  She does not have issues with urinary incontinence.  She does not engage in fluid restrictions.  She does engage in toilet mapping.  She has recently been hospitalized for hypoxia secondary to a fungal pneumonia.    She is currently not taking any OAB medications, but she is taking trimethoprim 100 mg daily.  She has issues with dryness with anticholinergic OAB .  Because of her recent hospitalizations, she is not certain if her urinary symptoms have worsened or at baseline because she feels she is felt poorly for so long.  Patient denies any modifying or aggravating factors.  Patient denies any gross hematuria, dysuria or suprapubic/flank pain.  Patient denies any fevers, chills, nausea or vomiting.    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 NO cervix on exam 01/10/21   BACK SURGERY     BREAST BIOPSY Left 09/15/2020   [place] bx of mass, path pending, Q marker   BREAST BIOPSY Left 09/15/2020   [place] bx of LN, hydromarker, path pending   BREAST RECONSTRUCTION WITH PLACEMENT OF TISSUE EXPANDER AND FLEX HD (ACELLULAR HYDRATED DERMIS) Bilateral 03/17/2021   Procedure: IMMEDIATE BILATERAL BREAST RECONSTRUCTION WITH PLACEMENT OF TISSUE EXPANDER AND FLEX HD (ACELLULAR HYDRATED DERMIS);  Surgeon: Wallace Going, DO;  Location: [facility];  Service: Plastics;  Laterality: Bilateral;   CAPSULECTOMY Right 10/18/2022   Procedure: Right breast capsule release with adjustment;  Surgeon: Wallace Going, DO;  Location: [facility];  Service: Plastics;  Laterality: Right;   COLONOSCOPY WITH PROPOFOL N/A 11/21/2021   Procedure: COLONOSCOPY WITH PROPOFOL;  Surgeon:  Lin Landsman, MD;  Location: [facility];  Service: Gastroenterology;  Laterality: N/A;   FLEXIBLE BRONCHOSCOPY Bilateral 12/01/2022   Procedure: FLEXIBLE BRONCHOSCOPY;  Surgeon: Armando Reichert, MD;  Location: [facility];  Service: Pulmonary;  Laterality: Bilateral;   IR IMAGING GUIDED PORT INSERTION  10/01/2020   MODIFIED MASTECTOMY Left 03/17/2021   Procedure: LEFT MODIFIED RADICAL MASTECTOMY;  Surgeon: Erroll Luna, MD;  Location: [facility];  Service: General;   Laterality: Left;   PORTA CATH REMOVAL Right 03/17/2021   Procedure: PORTA CATH REMOVAL;  Surgeon: Erroll Luna, MD;  Location: [facility];  Service: General;  Laterality: Right;   REMOVAL OF BILATERAL TISSUE EXPANDERS WITH PLACEMENT OF BILATERAL BREAST IMPLANTS Bilateral 05/09/2021   Procedure: REMOVAL OF BILATERAL TISSUE EXPANDERS WITH PLACEMENT OF BILATERAL BREAST IMPLANTS;  Surgeon: Wallace Going, DO;  Location: [facility];  Service: Plastics;  Laterality: Bilateral;  90 min   TOTAL MASTECTOMY Right 03/17/2021   Procedure: TOTAL MASTECTOMY;  Surgeon: Erroll Luna, MD;  Location: [facility];  Service: General;  Laterality: Right;    Home Medications:  Allergies as of 12/13/2022       Reactions   Morphine Nausea And Vomiting, Other (See Comments)   migranes Other reaction(s): Headache Migraine, vomiting   Sertraline Hcl    REACTION: Worsened symptoms of IBS   Sulfa Antibiotics Rash   Sulfamethoxazole Rash   Sulfonamide Derivatives Rash        Medication List        Accurate as of December 13, 2022 11:59 PM. If you have any questions, ask your nurse or doctor.          acetaminophen 650 MG CR tablet Commonly known as: TYLENOL Take 1,300 mg by mouth every 8 (eight) hours as needed for pain.   albuterol 108 (90 Base) MCG/ACT inhaler Commonly known as: VENTOLIN HFA Inhale 2 puffs into the lungs every 6 (six) hours as needed for wheezing or shortness of breath.   albuterol (2.5 MG/3ML) 0.083% nebulizer solution Commonly known as: PROVENTIL Take 3 mLs (2.5 mg total) by nebulization every 6 (six) hours as needed for wheezing or shortness of breath.   anastrozole 1 MG tablet Commonly known as: ARIMIDEX Take 1 tablet (1 mg total) by mouth daily.   atovaquone 750 MG/5ML suspension Commonly known as: MEPRON Take 10 mLs (1,500 mg total) by mouth daily.   b complex vitamins capsule Take 1 capsule by mouth daily.    baclofen 10 MG tablet Commonly known as: LIORESAL Take 1 tablet (10 mg total) by mouth 3 (three) times daily as needed for muscle spasms.   Breztri Aerosphere 160-9-4.8 MCG/ACT Aero Generic drug: Budeson-Glycopyrrol-Formoterol Inhale 2 puffs into the lungs 2 (two) times daily.   calcium-vitamin D 500-5 MG-MCG tablet Commonly known as: OSCAL WITH D Take 1 tablet by mouth daily with breakfast.   cholecalciferol 25 MCG (1000 UNIT) tablet Commonly known as: VITAMIN D3 Take 1,000 Units by mouth daily.   dextromethorphan-guaiFENesin 10-100 MG/5ML liquid Commonly known as: ROBITUSSIN-DM Take 10 mLs by mouth every 4 (four) hours as needed for cough.   docusate sodium 100 MG capsule Commonly known as: COLACE Take 100 mg by mouth daily as needed for mild constipation.   FLUoxetine 20 MG capsule Commonly known as: PROZAC Take 1 capsule (20 mg total) by mouth every morning.   FreeStyle Freedom Lite w/Device Kit Test in the morning, at noon, and at bedtime.   freestyle lancets Test  in the morning, at noon, and at bedtime.   FREESTYLE LITE test strip Generic drug: glucose blood Test in the morning, at noon, and at bedtime   Gemtesa 75 MG Tabs Generic drug: Vibegron Take 1 tablet (75 mg total) by mouth daily. Started by: Zara Council, PA-C   ibuprofen 400 MG tablet Commonly known as: ADVIL Take 400 mg by mouth every 6 (six) hours as needed.   Lancet Device Misc 1 each by Does not apply route in the morning, at noon, and at bedtime. May substitute to any manufacturer covered by patient's insurance.   LORazepam 0.5 MG tablet Commonly known as: ATIVAN Take 1 tablet (0.5 mg total) by mouth 2 (two) times daily as needed for anxiety.   MULTIVITAL PO Take 1 Dose by mouth daily.   naloxone 4 MG/0.1ML Liqd nasal spray kit Commonly known as: NARCAN Place 1 spray into the nose as needed for opioid-induced respiratory depresssion. In case of emergency (overdose), spray once into  each nostril. If no response within 3 minutes, repeat application and call [phone].   nicotine 21 mg/24hr patch Commonly known as: NICODERM CQ - dosed in mg/24 hours Apply one patch to skin once daily (use 1 patch daily)   oxyCODONE 5 MG immediate release tablet Commonly known as: Oxy IR/ROXICODONE Take 1 tablet (5 mg total) by mouth 2 (two) times daily. Must last 30 days.   oxyCODONE 5 MG immediate release tablet Commonly known as: Oxy IR/ROXICODONE Take 1 tablet (5 mg total) by mouth 4 (four) times daily as needed.   predniSONE 20 MG tablet Commonly known as: DELTASONE Take 2 tablets (40 mg total) by mouth daily until seen by pulmonary to start taper   pregabalin 150 MG capsule Commonly known as: LYRICA Take 1 capsule (150 mg total) by mouth in the morning, at noon, and at bedtime.   PROBIOTIC ACIDOPHILUS PO Take 1 capsule by mouth daily.   rosuvastatin 5 MG tablet Commonly known as: CRESTOR Take 5 mg by mouth daily.   trimethoprim 100 MG tablet Commonly known as: TRIMPEX Take 1 tablet (100 mg total) by mouth daily.   vitamin B-12 100 MCG tablet Commonly known as: CYANOCOBALAMIN Take 100 mcg by mouth daily. gummy   cyanocobalamin 1000 MCG/ML injection Commonly known as: VITAMIN B12 Inject 1 mL into the muscle once monthly (Inject 1 mL (1,000 mcg total) into the muscle every 30 (thirty) days.)        Allergies:  Allergies  Allergen Reactions   Morphine Nausea And Vomiting and Other (See Comments)    migranes Other reaction(s): Headache Migraine, vomiting   Sertraline Hcl     REACTION: Worsened symptoms of IBS   Sulfa Antibiotics Rash   Sulfamethoxazole Rash   Sulfonamide Derivatives Rash    Family History: Family History  Problem Relation Age of Onset   Hypertension Mother    Osteoarthritis Mother    Diverticulitis Mother    Heart failure Father    Hypertension Father    Gout Father    Heart attack Father 22   Diverticulitis Brother    Ovarian  cancer Paternal Grandmother     Social History:  reports that she has been smoking cigarettes. She has a 20.00 pack-year smoking history. She has never used smokeless tobacco. She reports that she does not currently use alcohol. She reports that she does not use drugs.  ROS: Pertinent ROS in HPI  Physical Exam: BP 122/70   Pulse 98   Ht 5' (1.524 m)  Wt 125 lb (56.7 kg)   BMI 24.41 kg/m   Constitutional:  Well nourished. Alert and oriented, No acute distress. HEENT: [place] AT, moist mucus membranes.  Trachea midline Cardiovascular: No clubbing, cyanosis, or edema. Respiratory: Normal respiratory effort, no increased work of breathing. Neurologic: Grossly intact, no focal deficits, moving all 4 extremities. Psychiatric: Normal mood and affect.    Laboratory Data: Lab Results  Component Value Date   WBC 14.3 (H) 12/08/2022   HGB 13.6 12/08/2022   HCT 42.3 12/08/2022   MCV 94.2 12/08/2022   PLT 205 12/08/2022    Lab Results  Component Value Date   CREATININE 0.84 12/08/2022    Lab Results  Component Value Date   HGBA1C 5.6 11/24/2022    Lab Results  Component Value Date   TSH 1.21 12/06/2022    Lab Results  Component Value Date   AST 20 12/08/2022   Lab Results  Component Value Date   ALT 27 12/08/2022    Urinalysis See EPIC and HPI I have reviewed the labs.   Pertinent Imaging:  12/13/22 12:08  Scan Result 175   Assessment & Plan:    1. High risk hematuria -smoker -hematuria work up (2023) negative -no reports of gross heme -UA 11-30 RBC's -Urine sent for culture to rule out any indolent infection  2. OAB -await culture results -Sent a prescription in for Gemtesa 75 mg daily to see if her insurance will provide coverage for the medication  3. rUTI's -No recent infections -Continue trimethoprim 100 mg daily  Return for follow up pending urine culture results .  These notes generated with voice recognition software. I apologize for  typographical errors.  [place], [place] [address]  [place], [place] [address] [phone]

## 2022-12-12 NOTE — Telephone Encounter (Signed)
POC eval placed to adapt. Lm to make patient aware.

## 2022-12-13 ENCOUNTER — Encounter: Payer: Self-pay | Admitting: Urology

## 2022-12-13 ENCOUNTER — Other Ambulatory Visit: Payer: Self-pay

## 2022-12-13 ENCOUNTER — Other Ambulatory Visit (HOSPITAL_COMMUNITY): Payer: Self-pay

## 2022-12-13 ENCOUNTER — Ambulatory Visit (INDEPENDENT_AMBULATORY_CARE_PROVIDER_SITE_OTHER): Payer: Commercial Managed Care - HMO | Admitting: Urology

## 2022-12-13 VITALS — BP 122/70 | HR 98 | Ht 60.0 in | Wt 125.0 lb

## 2022-12-13 DIAGNOSIS — R319 Hematuria, unspecified: Secondary | ICD-10-CM

## 2022-12-13 DIAGNOSIS — N3281 Overactive bladder: Secondary | ICD-10-CM

## 2022-12-13 LAB — MISC LABCORP TEST (SEND OUT)
Labcorp test code: 182857
Labcorp test code: 180232
Labcorp test code: 182857

## 2022-12-13 LAB — NOCARDIA SUSCEPTIBILITY BROTH (NOCSBL)

## 2022-12-13 LAB — URINALYSIS, COMPLETE
Bilirubin, UA: NEGATIVE
Glucose, UA: NEGATIVE
Ketones, UA: NEGATIVE
Nitrite, UA: NEGATIVE
Specific Gravity, UA: 1.02 (ref 1.005–1.030)
Urobilinogen, Ur: 0.2 mg/dL (ref 0.2–1.0)
pH, UA: 6.5 (ref 5.0–7.5)

## 2022-12-13 LAB — MICROSCOPIC EXAMINATION

## 2022-12-13 LAB — PNEUMOCYSTIS SMEAR, DFA

## 2022-12-13 LAB — BLADDER SCAN AMB NON-IMAGING: Scan Result: 175

## 2022-12-13 MED ORDER — GEMTESA 75 MG PO TABS
75.0000 mg | ORAL_TABLET | Freq: Every day | ORAL | 3 refills | Status: DC
  Filled 2022-12-13 – 2022-12-19 (×4): qty 30, 30d supply, fill #0

## 2022-12-13 NOTE — Telephone Encounter (Signed)
close

## 2022-12-14 ENCOUNTER — Other Ambulatory Visit: Payer: Self-pay

## 2022-12-14 ENCOUNTER — Encounter: Payer: Self-pay | Admitting: Occupational Therapy

## 2022-12-14 ENCOUNTER — Ambulatory Visit: Payer: Commercial Managed Care - HMO | Attending: Nurse Practitioner | Admitting: Occupational Therapy

## 2022-12-14 DIAGNOSIS — Z170 Estrogen receptor positive status [ER+]: Secondary | ICD-10-CM | POA: Diagnosis not present

## 2022-12-14 DIAGNOSIS — I972 Postmastectomy lymphedema syndrome: Secondary | ICD-10-CM | POA: Diagnosis present

## 2022-12-14 DIAGNOSIS — C50412 Malignant neoplasm of upper-outer quadrant of left female breast: Secondary | ICD-10-CM | POA: Insufficient documentation

## 2022-12-14 DIAGNOSIS — I890 Lymphedema, not elsewhere classified: Secondary | ICD-10-CM | POA: Insufficient documentation

## 2022-12-14 LAB — ACID FAST SMEAR (AFB, MYCOBACTERIA): Acid Fast Smear: NEGATIVE

## 2022-12-14 NOTE — Therapy (Signed)
OUTPATIENT OCCUPATIONAL THERAPY TREATMENT NOTE POST MASTECTOMY LUE/LUQ UPPER EXTREMITY LYMPHEDEMA  Patient Name: Tricia Berry MRN: [id number] DOB:1970/11/10, 52 y.o., female Today's Date: 12/14/2022    OT End of Session - 12/14/22 0759     Visit Number 7    Number of Visits 36    Date for OT Re-Evaluation 11/20/22    OT Start Time 0800    OT Stop Time 0900    OT Time Calculation (min) 60 min    Activity Tolerance Patient tolerated treatment well;No increased pain    Behavior During Therapy WFL for tasks assessed/performed              Past Medical History:  Diagnosis Date   Anemia    Anginal pain (HCC)    excertion not cardiac related per patient   Anxiety    Breast cancer ([place]) 12/2021   left breast IMC   Diverticulitis    Dyspnea    Dysrhythmia    prolong QT   Family history of ovarian cancer    GERD (gastroesophageal reflux disease)    Headache    IBS (irritable bowel syndrome)    Neuromuscular disorder (HCC)    neuropathy from chemo   Pneumonia    PONV (postoperative nausea and vomiting)    severe migraine and vomiting post anesthesia   Scoliosis    Past Surgical History:  Procedure Laterality Date   ABDOMINAL HYSTERECTOMY     still has ovaries, no gyn cancer, hysterectomy due to endometriosis. NO cervix on exam 01/10/21   BACK SURGERY     BREAST BIOPSY Left 09/15/2020   [place] bx of mass, path pending, Q marker   BREAST BIOPSY Left 09/15/2020   [place] bx of LN, hydromarker, path pending   BREAST RECONSTRUCTION WITH PLACEMENT OF TISSUE EXPANDER AND FLEX HD (ACELLULAR HYDRATED DERMIS) Bilateral 03/17/2021   Procedure: IMMEDIATE BILATERAL BREAST RECONSTRUCTION WITH PLACEMENT OF TISSUE EXPANDER AND FLEX HD (ACELLULAR HYDRATED DERMIS);  Surgeon: Wallace Going, DO;  Location: [facility];  Service: Plastics;  Laterality: Bilateral;   CAPSULECTOMY Right 10/18/2022   Procedure: Right breast capsule release with adjustment;  Surgeon:  Wallace Going, DO;  Location: [place];  Service: Plastics;  Laterality: Right;   COLONOSCOPY WITH PROPOFOL N/A 11/21/2021   Procedure: COLONOSCOPY WITH PROPOFOL;  Surgeon: Lin Landsman, MD;  Location: [facility] ENDOSCOPY;  Service: Gastroenterology;  Laterality: N/A;   FLEXIBLE BRONCHOSCOPY Bilateral 12/01/2022   Procedure: FLEXIBLE BRONCHOSCOPY;  Surgeon: Armando Reichert, MD;  Location: [facility] ORS;  Service: Pulmonary;  Laterality: Bilateral;   IR IMAGING GUIDED PORT INSERTION  10/01/2020   MODIFIED MASTECTOMY Left 03/17/2021   Procedure: LEFT MODIFIED RADICAL MASTECTOMY;  Surgeon: Erroll Luna, MD;  Location: [place];  Service: General;  Laterality: Left;   PORTA CATH REMOVAL Right 03/17/2021   Procedure: PORTA CATH REMOVAL;  Surgeon: Erroll Luna, MD;  Location: [place];  Service: General;  Laterality: Right;   REMOVAL OF BILATERAL TISSUE EXPANDERS WITH PLACEMENT OF BILATERAL BREAST IMPLANTS Bilateral 05/09/2021   Procedure: REMOVAL OF BILATERAL TISSUE EXPANDERS WITH PLACEMENT OF BILATERAL BREAST IMPLANTS;  Surgeon: Wallace Going, DO;  Location: [place];  Service: Plastics;  Laterality: Bilateral;  90 min   TOTAL MASTECTOMY Right 03/17/2021   Procedure: TOTAL MASTECTOMY;  Surgeon: Erroll Luna, MD;  Location: [place];  Service: General;  Laterality: Right;   Patient Active Problem List   Diagnosis Date Noted  Brain lesion 12/08/2022   Shortness of breath 12/07/2022   Fatigue 12/06/2022   Jaw pain 12/06/2022   Thrush 11/24/2022   Impaired fasting glucose 11/24/2022   Hyperlipidemia, unspecified 11/22/2022   Hypoxia 11/21/2022   Acute on chronic respiratory failure with hypoxia ([place]) 11/21/2022   Chronic sacroiliac joint pain (Bilateral) 10/03/2022    Class: Chronic   Chronic hip pain (Left) 10/03/2022    Class: Chronic   Multifocal pneumonia 08/29/2022   SOB (shortness of  breath) 08/29/2022   Breast asymmetry following reconstructive surgery 08/15/2022   Lumbosacral facet arthropathy (Multilevel) (Bilateral) 07/05/2022   Lumbar spinal facet joint arthropathy with concurrent effusion (L3-4) (Right) 07/05/2022   Degenerative lumbar rotatory levoscoliosis 07/05/2022   Lumbosacral foraminal stenosis (Bilateral: L3-4) (Left: L4-5, L5-S1) 07/05/2022   Lumbosacral lateral recess stenosis (Left: L4-5) (Bilateral: L5-S1) 07/05/2022   Lumbar nerve root impingement (Bilateral: L3-4, L5-S1) (Left: L4-5) 07/05/2022   Long term prescription benzodiazepine use 07/05/2022   Chronic use of opiate for therapeutic purpose 07/05/2022   Lumbosacral facet syndrome (Bilateral) 07/05/2022   Pain medication agreement signed (12/07/21) 05/29/2022   History of breast cancer (Left) 05/29/2022   History of mastectomy (Bilateral) 05/29/2022   Chronic calf pain (2ry area of Pain) (Bilateral) 05/29/2022   Chronic lower extremity pain (3ry area of Pain) (Bilateral) 05/29/2022   Chronic feet pain (Bilateral) 05/29/2022   Chronic upper extremity pain (4th area of Pain) (Bilateral) (L>R) 05/29/2022   Chronic hand pain (Bilateral) (L>R) 05/29/2022   Chronic pain 05/29/2022   Abnormal MRI, lumbar spine (10/13/2021) 05/29/2022   Chronic pain syndrome 05/28/2022   Pharmacologic therapy 05/28/2022   Disorder of skeletal system 05/28/2022   Problems influencing health status 05/28/2022   Family history of heart disease 11/16/2021   Acquired absence of breast 08/16/2021   B12 deficiency 07/21/2021   Atherosclerosis of aorta ([place]) 07/20/2021   Hepatic steatosis 07/20/2021   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   Genetic testing 11/22/2020   Family history of ovarian cancer    Candidal vulvovaginitis 11/01/2020   Chronic low back pain (1ry area of Pain) (Bilateral) (R>L) w/o sciatica 10/06/2020   Encounter for medical examination to establish care 09/26/2020   Malignant neoplasm of  left female breast ([place]) 09/26/2020   Sprain of lumbar region 03/08/2010   INTERNAL HEMORRHOIDS 01/27/2010   Anal fissure 01/27/2010   Osteoarthrosis, hand 01/04/2010   Arthralgia 10/05/2009   INSOMNIA, CHRONIC 05/06/2009   ALLERGIC RHINITIS, SEASONAL 02/09/2009   Tobacco abuse 12/29/2008   IBS 10/13/2008   Anxiety state 08/25/2008   DEPRESSION, RECURRENT 08/25/2008    PCP: Burnard Hawthorne, FPN  REFERRING PROVIDER: Verlon Au, NP  REFERRING DIAG: 197.2  THERAPY DIAG:  LUE/ LUQ Post-mastectomy lymphedema syndrome  ONSET DATE: 03/17/21  SUBJECTIVE  SUBJECTIVE STATEMENT:Tricia Berry presents for OT to address L breast cancer related lymphedema and associated pain. Pt is released to resume OT after hospitalization for respiratory failure. Reports she continues to have pulling sensation in her axilla extending volarly down her arm towards elbow. Pt does not rate pain numerically. Pt's compression garments and devices arrived during extended visit interval and Pt is happy to be able to fit those today.  PERTINENT HISTORY: L Breast Ca ER+, PR+, HER2-, stage III  L breast biopsy 09/15/20; Neoadjuvant chemotherapy; L modified radical mastectomy and R prophylactic total mastectomy with immediate placement of tissue expanders 03/17/21; L ALND 7+/17. XRT completed 02/18/21. Marland Kitchen  Upcoming R capsulectomy scheduled 09/07/22. Chronic pain syndrome, chronic insomnia, OA B hands, Tobacco abuse, IBS, Anxiety, Recurrent Depression   PAIN:  Are you having pain? Yes. Unchanged since 10/06/22 NPRS scale: not rated numerically Pain location: L lateral trunk, axilla, volar arm and forearm, chest wall Pain orientation L PAIN TYPE: pulling, discomfort, fullness, tightness Pain description: intermittent Aggravating  factors: end range shoulder AROM, lifting, carrying, stretching Relieving factors: MLD, compression  PRECAUTIONS: Falls,  Lymphedema Precautions  HAND DOMINANCE : right   PRIOR LEVEL OF FUNCTION: Independent  PATIENT GOALS: make sure lymphedema doesn't get worse; reduce pain and discomfort in my hands and arm and trunk   OBJECTIVE   LYMPHEDEMA ASSESSMENTS:   FOTO FUNCTIONAL OUTCOME SCORE: Intake 59/100%  LYMPHEDEMA LIFE IMPACT SCALE (LLIS) Intake TBA 1st Rx visit. Pt did not complete backside of page  TODAY'S TREATMENT:  Pt edu  PATIENT EDUCATION:  Education details: Pt edu for there ex    for axillary web syndrome- stretches to lengthen cording Person educated: Patient Education method: Consulting civil engineer, Demonstration, and Handouts Education comprehension: verbalized understanding, returned demonstration, and needs further education  LYMPHEDEMA SELF-CARE HOME PROGRAM: Observe lymphedema precautions and prevention principals to limit progression Simple Self-Manual lymphatic drainage (MLD LUE/LUQ Lymphatic pumping there ex and soft tissue stretching there ex Daily and PRN skin care Compression arm sleeve prophylactic ly and PRN for 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 Ensure pad covers serratus anterior and L lateral trunk as well as chest wall. Pt is of petite stature so custom pas and sleeve may be required to achieve correct fit RECOMMENDED COMPRESSION: Custom Juzo, ccl 1 (20-30 mmHg) compression arm sleeve, x 2 Custom ccl 1 Juzo glove, x 2 JoviPak lumpectomy pad Belisse compression bras , x 2 Unable to fit with off the shelf compression arm sleeve as Pt is too petite to fit into manufacturer's size range for smallest size 1  ASSESSMENT:  CLINICAL IMPRESSION: Fit custom prophylactic, RUE, Juzo SOFT (ccl 1 (20-30 mmHg) compression arm sleeve and ccl 1 glove. Both fit very well and  provide appropriate level of compression. Pt reports comfort in garments. Pt expected to receive 2 of each of these garments, but received only one of each. DME vendor notified and requested Pt call on fitting sheet and faxed after session. JoviPak lumpectomy pads fit well. No replacements needed. Unfortunately compression bra, Belisse 32 C/D does not fit due to due to weight gain 2/2 steroids. Faxed new measurements to vendor and requested assistance with sizing. Pt received 1 bra only and was expecting 2. Need replacements. Will see Pt at scheduled appointment next week to follow along with garment fitting, and will call when bra replacement arrives. Pt took all garments with her to return bra to vendor  PRN  OBJECTIVE IMPAIRMENTS: decreased activity tolerance, decreased balance, decreased endurance, decreased knowledge of condition, decreased knowledge of use of DME, impaired LUE shoulder AROM, decreased strength, impaired flexibility, impaired sensation, impaired UE functional use, postural dysfunction, impaired dynamic balance, muscle weakness, pain, and increased lymphedema progression risk.   ACTIVITY LIMITATIONS: carrying, lifting, sleeping, bed mobility, reach over head, hygiene/grooming, caring for others, working as Marine scientist, driving, meal prep, cooking  PARTICIPATION LIMITATIONS: cleaning, laundry, driving, shopping, occupation, and yard work  PERSONAL FACTORS: 3+ comorbidities: chronic pain, lumbar stenosis, chemo-induced neuropathy in all 4 limbs  are also affecting patient's functional outcome.   REHAB POTENTIAL: Good for limiting LE progression   GOALS: Goals reviewed with patient? Yes  SHORT TERM GOALS: Target date: 4th OT Rx visit    Pt will demonstrate understanding of lymphedema precautions and prevention strategies with modified independence using a printed reference to identify at least 5 precautions and discussing how s/he may implement them into daily life to reduce risk of  progression and to limit infection risk. Baseline:Max A Goal status: 11/09/22 NOT MET   LONG TERM GOALS: Target date: 03/08/2023    Given this patient's Intake score of 59/100% on the functional outcomes FOTO tool, patient will experience an increase in function of 3 points  to improve basic and instrumental ADLs performance, including lymphedema self-care. Baseline: 59/100% Goal status:  11/09/22 NOT MET  2.  Given this patient's Intake score of TBA/100% on the Lymphedema Life Impact Scale (LLIS), patient will experience an increase of 5 points in her perceived level of functional impairment resulting from lymphedema to improve functional performance and quality of life (QOL). Baseline: TBA Goal status:  11/09/22 NOT MET  3.  With assistive devices and modified independence (extra time) Pt will be able to don and doff appropriate compression garments and/or devices to control BLE lymphedema and to limit progression.  Baseline: Max A Goal status:  11/09/22 NOT MET  4.  Pt will be able to correctly perform all lymphedema self-care home program components using correct techniques with extra time and assistive devices PRN (modified independence), including simple self MLD, lymphatic pumping exercise, don and doff appropriate compression garments/ devices, and perform daily skin care regime to limit progression. Baseline: Max A Goal status:  11/09/22 NOT MET  PLAN:  PT FREQUENCY: 1-2x/week  PT DURATION: 12 weeks  PLANNED INTERVENTIONS: Therapeutic exercises, Therapeutic activity, Patient/Family education, Self Care, DME instructions, Manual lymph drainage, Taping, Manual therapy, and compression garment measurement , fitting and training  PLAN FOR NEXT SESSION:  Complete compression glove measurements Further assess AWS Have Pt complete LLIS Complete initial volumetrics using percentages and limb volume differential vs circumferences.  Andrey Spearman, MS, OTR/L, CLT-LANA 12/14/22 12:42  PM

## 2022-12-14 NOTE — Progress Notes (Signed)
Yes, thank you. SZP placed

## 2022-12-15 ENCOUNTER — Telehealth: Payer: Self-pay | Admitting: Family

## 2022-12-15 ENCOUNTER — Other Ambulatory Visit: Payer: Self-pay | Admitting: Family

## 2022-12-15 ENCOUNTER — Ambulatory Visit: Payer: Commercial Managed Care - HMO | Admitting: Surgical

## 2022-12-15 ENCOUNTER — Other Ambulatory Visit (HOSPITAL_COMMUNITY): Payer: Self-pay

## 2022-12-15 ENCOUNTER — Other Ambulatory Visit: Payer: Self-pay

## 2022-12-15 DIAGNOSIS — N651 Disproportion of reconstructed breast: Secondary | ICD-10-CM

## 2022-12-15 DIAGNOSIS — F17200 Nicotine dependence, unspecified, uncomplicated: Secondary | ICD-10-CM

## 2022-12-15 DIAGNOSIS — Z853 Personal history of malignant neoplasm of breast: Secondary | ICD-10-CM

## 2022-12-15 DIAGNOSIS — Z170 Estrogen receptor positive status [ER+]: Secondary | ICD-10-CM

## 2022-12-15 DIAGNOSIS — Z9013 Acquired absence of bilateral breasts and nipples: Secondary | ICD-10-CM | POA: Diagnosis not present

## 2022-12-15 DIAGNOSIS — B379 Candidiasis, unspecified: Secondary | ICD-10-CM

## 2022-12-15 LAB — CULTURE, FUNGUS WITHOUT SMEAR

## 2022-12-15 MED ORDER — FLUCONAZOLE 150 MG PO TABS
150.0000 mg | ORAL_TABLET | Freq: Once | ORAL | 1 refills | Status: AC
  Filled 2022-12-15: qty 2, 3d supply, fill #0

## 2022-12-15 MED FILL — Nicotine TD Patch 24HR 21 MG/24HR: TRANSDERMAL | 28 days supply | Qty: 28 | Fill #0 | Status: AC

## 2022-12-15 NOTE — Telephone Encounter (Signed)
Pt returned Jenate call. She's aware that med was sent over to her pharmacy.

## 2022-12-15 NOTE — Progress Notes (Signed)
NIPPLE AREOLAR TATTOO PROCEDURE  PREOPERATIVE DIAGNOSIS:  Acquired absence of bilateral nipple areolar   POSTOPERATIVE DIAGNOSIS: Acquired absence of bilateral nipple areolar    PROCEDURES: bilateral nipple areolar tattoo   ANESTHESIA:  none  COMPLICATIONS: None.  JUSTIFICATION FOR PROCEDURE:  [name] is a 52 y.o. female with a history of breast cancer status post breast reconstruction. The patient presents for nipple areolar complex tattoo. Risks, benefits, indications, and alternatives of the above described procedures were discussed with the patient and all the patient's questions were answered.  Consent was confirmed.  Patient reports she has overall been doing well, reports that she is not having any infectious symptoms, reports that she is not using oxygen at this time, but does use it at home occasionally.  DESCRIPTION OF PROCEDURE: After informed consent was obtained and proper identification of patient and surgical site was made, the patient was taken to the procedure room and resting in procedure chair. The patient was prepped and draped in the usual sterile fashion. Attention was turned to the selection of flesh colored permanent tattoo ink to produce the appropriate color for nipple areolar complex tattooing. Color, size and location was confirmed with the patient. Pre-op photos were obtained and placed in patient chart with patient consent. Using a Digital Revo 7R pronged tattoo head, pigment was instilled to the designed nipple areolar complex, which was confirmed preoperatively with the patient. Once adequate pigment had been applied to the nipple areolar complex, vaseline followed by an occlusive dressing was applied. The patient tolerated the procedure well. There were no complications  The total area tattooed was 18 cm, each areola tattoo was 9 cm.  A 10:3 ratio of fair honey and warm honey was used to create the desired areolar color. 10 parts fair honey and  3 parts warm honey, both expire January 2026.  The shading under the nipple was then created using 3 parts fair honey and 1 parts warm honey.

## 2022-12-15 NOTE — Telephone Encounter (Signed)
Pt need a refill on fluconazole sent to [facility]

## 2022-12-17 ENCOUNTER — Other Ambulatory Visit: Payer: Self-pay

## 2022-12-17 LAB — CULTURE, URINE COMPREHENSIVE

## 2022-12-18 ENCOUNTER — Other Ambulatory Visit: Payer: Self-pay | Admitting: Family

## 2022-12-18 ENCOUNTER — Other Ambulatory Visit: Payer: Self-pay

## 2022-12-18 ENCOUNTER — Telehealth: Payer: Self-pay | Admitting: Family Medicine

## 2022-12-18 DIAGNOSIS — J189 Pneumonia, unspecified organism: Secondary | ICD-10-CM

## 2022-12-18 MED ORDER — ALBUTEROL SULFATE HFA 108 (90 BASE) MCG/ACT IN AERS
2.0000 | INHALATION_SPRAY | Freq: Four times a day (QID) | RESPIRATORY_TRACT | 0 refills | Status: DC | PRN
  Filled 2022-12-18: qty 6.7, 25d supply, fill #0

## 2022-12-18 NOTE — Telephone Encounter (Signed)
Attempted to reach patient, will try again.

## 2022-12-18 NOTE — Telephone Encounter (Signed)
-----   Message from Nori Riis, PA-C sent at 12/17/2022  5:17 PM EST ----- Please let Tricia Berry know that her urine culture was negative for infection.  Was she able to fill the [facility] prescription?  If so, I would like to see her again in one month for OAB and PVR.  If not, we can send in a script for Sanctura XL 60 mg daily and then have a follow up in one month.

## 2022-12-18 NOTE — Telephone Encounter (Signed)
Pt calls triage line informed her of the information below, she states that she has not filled RX for Gemtesa yet put will call to fill today.

## 2022-12-19 ENCOUNTER — Encounter: Payer: Self-pay | Admitting: Family

## 2022-12-19 ENCOUNTER — Telehealth: Payer: Self-pay | Admitting: Student in an Organized Health Care Education/Training Program

## 2022-12-19 ENCOUNTER — Other Ambulatory Visit: Payer: Self-pay

## 2022-12-19 DIAGNOSIS — J189 Pneumonia, unspecified organism: Secondary | ICD-10-CM

## 2022-12-19 MED ORDER — ATOVAQUONE 750 MG/5ML PO SUSP
1500.0000 mg | Freq: Every day | ORAL | 1 refills | Status: DC
  Filled 2022-12-19: qty 300, 30d supply, fill #0
  Filled 2023-01-21: qty 300, 30d supply, fill #1

## 2022-12-19 MED ORDER — PREDNISONE 20 MG PO TABS
40.0000 mg | ORAL_TABLET | Freq: Every day | ORAL | 1 refills | Status: DC
  Filled 2022-12-19: qty 60, 30d supply, fill #0
  Filled 2023-01-21: qty 60, 30d supply, fill #1

## 2022-12-19 MED ORDER — ANORO ELLIPTA 62.5-25 MCG/ACT IN AEPB
1.0000 | INHALATION_SPRAY | Freq: Every day | RESPIRATORY_TRACT | 6 refills | Status: DC
  Filled 2022-12-19: qty 60, 30d supply, fill #0
  Filled 2023-01-21: qty 60, 30d supply, fill #1
  Filled 2023-02-21: qty 60, 30d supply, fill #2
  Filled 2023-03-20: qty 30, 30d supply, fill #3
  Filled 2023-03-21: qty 60, 30d supply, fill #3

## 2022-12-19 NOTE — Telephone Encounter (Signed)
Tricia Berry from [employer] on phone regarding this PT. Wanted [place] to know [facility] was doing a test for her  but their was not enough specimen for a good test. Pls call her w/any questions:   Her CB #@ is [phone]

## 2022-12-19 NOTE — Telephone Encounter (Signed)
Patient would like the nurse to call regarding her PA for her medications.  She stated she requested it last week and has not heard anything yet.  Please advise and call patient to discuss at [phone]

## 2022-12-19 NOTE — Telephone Encounter (Signed)
Thanks for letting me know. Nothing to do at this moment.

## 2022-12-19 NOTE — Telephone Encounter (Signed)
I have sent a refill on the prednisone and mepron, I have also discontinued the [facility] and sent a prescription for Anoro Ellipta. Please call the patient and let her know. Thanks!

## 2022-12-19 NOTE — Telephone Encounter (Signed)
I spoke with the patient. She is needing a refill on the medications below. She said the last refills came from Dr. Leslye Peer when she was in the hospital. She wants to know if you will refill them since you told her to stay on them. Also, she said her insurance will not cover the [place] and she wants to know what else she can take that her insurance will approve?  Prednisone '20mg'$  2 tablets daily Mepron 79m daily  Pharmacy is [facility]

## 2022-12-19 NOTE — Telephone Encounter (Signed)
Spoke to [employer] with [facility].  She stated that BAL was collected 12/01/2022 and AFB and fungus are pending.  QNS for beta D glucan.  Dr. Genia Harold, please advise. Thanks

## 2022-12-19 NOTE — Telephone Encounter (Signed)
Noted  

## 2022-12-19 NOTE — Telephone Encounter (Signed)
I notified the patient.   Nothing further needed. 

## 2022-12-20 ENCOUNTER — Encounter: Payer: Self-pay | Admitting: Occupational Therapy

## 2022-12-20 ENCOUNTER — Ambulatory Visit: Payer: Commercial Managed Care - HMO | Admitting: Occupational Therapy

## 2022-12-20 ENCOUNTER — Other Ambulatory Visit: Payer: Self-pay

## 2022-12-20 ENCOUNTER — Other Ambulatory Visit: Payer: Self-pay | Admitting: Urology

## 2022-12-20 DIAGNOSIS — I972 Postmastectomy lymphedema syndrome: Secondary | ICD-10-CM | POA: Diagnosis not present

## 2022-12-20 NOTE — Therapy (Signed)
OUTPATIENT OCCUPATIONAL THERAPY TREATMENT NOTE  POST MASTECTOMY LUE/LUQ UPPER LYMPHEDEMA  Patient Name: Tricia Berry MRN: [id number] DOB:08/03/1971, 52 y.o., female Today's Date: 12/20/2022    OT End of Session - 12/20/22 1119     Visit Number 8    Number of Visits 36    Date for OT Re-Evaluation 11/20/22    OT Start Time 1110    OT Stop Time 1210    OT Time Calculation (min) 60 min    Activity Tolerance Patient tolerated treatment well;No increased pain    Behavior During Therapy WFL for tasks assessed/performed              Past Medical History:  Diagnosis Date   Anemia    Anginal pain (HCC)    excertion not cardiac related per patient   Anxiety    Breast cancer ([place]) 12/2021   left breast IMC   Diverticulitis    Dyspnea    Dysrhythmia    prolong QT   Family history of ovarian cancer    GERD (gastroesophageal reflux disease)    Headache    IBS (irritable bowel syndrome)    Neuromuscular disorder (HCC)    neuropathy from chemo   Pneumonia    PONV (postoperative nausea and vomiting)    severe migraine and vomiting post anesthesia   Scoliosis    Past Surgical History:  Procedure Laterality Date   ABDOMINAL HYSTERECTOMY     still has ovaries, no gyn cancer, hysterectomy due to endometriosis. NO cervix on exam 01/10/21   BACK SURGERY     BREAST BIOPSY Left 09/15/2020   [place] bx of mass, path pending, Q marker   BREAST BIOPSY Left 09/15/2020   [place] bx of LN, hydromarker, path pending   BREAST RECONSTRUCTION WITH PLACEMENT OF TISSUE EXPANDER AND FLEX HD (ACELLULAR HYDRATED DERMIS) Bilateral 03/17/2021   Procedure: IMMEDIATE BILATERAL BREAST RECONSTRUCTION WITH PLACEMENT OF TISSUE EXPANDER AND FLEX HD (ACELLULAR HYDRATED DERMIS);  Surgeon: Wallace Going, DO;  Location: [place];  Service: Plastics;  Laterality: Bilateral;   CAPSULECTOMY Right 10/18/2022   Procedure: Right breast capsule release with adjustment;  Surgeon: Wallace Going, DO;  Location: [place];  Service: Plastics;  Laterality: Right;   COLONOSCOPY WITH PROPOFOL N/A 11/21/2021   Procedure: COLONOSCOPY WITH PROPOFOL;  Surgeon: Lin Landsman, MD;  Location: [facility] ENDOSCOPY;  Service: Gastroenterology;  Laterality: N/A;   FLEXIBLE BRONCHOSCOPY Bilateral 12/01/2022   Procedure: FLEXIBLE BRONCHOSCOPY;  Surgeon: Armando Reichert, MD;  Location: [facility]S;  Service: Pulmonary;  Laterality: Bilateral;   IR IMAGING GUIDED PORT INSERTION  10/01/2020   MODIFIED MASTECTOMY Left 03/17/2021   Procedure: LEFT MODIFIED RADICAL MASTECTOMY;  Surgeon: Erroll Luna, MD;  Location: [place];  Service: General;  Laterality: Left;   PORTA CATH REMOVAL Right 03/17/2021   Procedure: PORTA CATH REMOVAL;  Surgeon: Erroll Luna, MD;  Location: [place];  Service: General;  Laterality: Right;   REMOVAL OF BILATERAL TISSUE EXPANDERS WITH PLACEMENT OF BILATERAL BREAST IMPLANTS Bilateral 05/09/2021   Procedure: REMOVAL OF BILATERAL TISSUE EXPANDERS WITH PLACEMENT OF BILATERAL BREAST IMPLANTS;  Surgeon: Wallace Going, DO;  Location: [place];  Service: Plastics;  Laterality: Bilateral;  90 min   TOTAL MASTECTOMY Right 03/17/2021   Procedure: TOTAL MASTECTOMY;  Surgeon: Erroll Luna, MD;  Location: [place];  Service: General;  Laterality: Right;   Patient Active Problem List   Diagnosis Date Noted  Brain lesion 12/08/2022   Shortness of breath 12/07/2022   Fatigue 12/06/2022   Jaw pain 12/06/2022   Thrush 11/24/2022   Impaired fasting glucose 11/24/2022   Hyperlipidemia, unspecified 11/22/2022   Hypoxia 11/21/2022   Acute on chronic respiratory failure with hypoxia ([place]) 11/21/2022   Chronic sacroiliac joint pain (Bilateral) 10/03/2022    Class: Chronic   Chronic hip pain (Left) 10/03/2022    Class: Chronic   Multifocal pneumonia 08/29/2022   SOB (shortness of breath)  08/29/2022   Breast asymmetry following reconstructive surgery 08/15/2022   Lumbosacral facet arthropathy (Multilevel) (Bilateral) 07/05/2022   Lumbar spinal facet joint arthropathy with concurrent effusion (L3-4) (Right) 07/05/2022   Degenerative lumbar rotatory levoscoliosis 07/05/2022   Lumbosacral foraminal stenosis (Bilateral: L3-4) (Left: L4-5, L5-S1) 07/05/2022   Lumbosacral lateral recess stenosis (Left: L4-5) (Bilateral: L5-S1) 07/05/2022   Lumbar nerve root impingement (Bilateral: L3-4, L5-S1) (Left: L4-5) 07/05/2022   Long term prescription benzodiazepine use 07/05/2022   Chronic use of opiate for therapeutic purpose 07/05/2022   Lumbosacral facet syndrome (Bilateral) 07/05/2022   Pain medication agreement signed (12/07/21) 05/29/2022   History of breast cancer (Left) 05/29/2022   History of mastectomy (Bilateral) 05/29/2022   Chronic calf pain (2ry area of Pain) (Bilateral) 05/29/2022   Chronic lower extremity pain (3ry area of Pain) (Bilateral) 05/29/2022   Chronic feet pain (Bilateral) 05/29/2022   Chronic upper extremity pain (4th area of Pain) (Bilateral) (L>R) 05/29/2022   Chronic hand pain (Bilateral) (L>R) 05/29/2022   Chronic pain 05/29/2022   Abnormal MRI, lumbar spine (10/13/2021) 05/29/2022   Chronic pain syndrome 05/28/2022   Pharmacologic therapy 05/28/2022   Disorder of skeletal system 05/28/2022   Problems influencing health status 05/28/2022   Family history of heart disease 11/16/2021   Acquired absence of breast 08/16/2021   B12 deficiency 07/21/2021   Atherosclerosis of aorta ([place]) 07/20/2021   Hepatic steatosis 07/20/2021   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   Genetic testing 11/22/2020   Family history of ovarian cancer    Candidal vulvovaginitis 11/01/2020   Chronic low back pain (1ry area of Pain) (Bilateral) (R>L) w/o sciatica 10/06/2020   Encounter for medical examination to establish care 09/26/2020   Malignant neoplasm of left  female breast ([place]) 09/26/2020   Sprain of lumbar region 03/08/2010   INTERNAL HEMORRHOIDS 01/27/2010   Anal fissure 01/27/2010   Osteoarthrosis, hand 01/04/2010   Arthralgia 10/05/2009   INSOMNIA, CHRONIC 05/06/2009   ALLERGIC RHINITIS, SEASONAL 02/09/2009   Tobacco abuse 12/29/2008   IBS 10/13/2008   Anxiety state 08/25/2008   DEPRESSION, RECURRENT 08/25/2008    PCP: Burnard Hawthorne, FPN  REFERRING PROVIDER: Verlon Au, NP  REFERRING DIAG: 197.2  THERAPY DIAG:  LUE/ LUQ Post-mastectomy lymphedema syndrome  ONSET DATE: 03/17/21  SUBJECTIVE  SUBJECTIVE STATEMENT:Natashia G Pinzon presents for OT to address L breast cancer related lymphedema and associated pain. Pt reports sensations of "pulling" and discomfort in the L axillare are unchanged. Pt reports she feels "bloated all over" and feels like "something in her throat  feels like it is making it more difficult to swallow". She believes this discomfort is 2/2 steroids. Pt does not rate pain numerically today. Pt reports she spoke with DME provider who explained why she only received one rather than 2 each of each compression garment and bra ordered is due to cost and OOP hardship. DME provider also provided instructions for shipping poorly fitting compression bra back for exchange for a larger size.  PERTINENT HISTORY: L Breast Ca ER+, PR+, HER2-, stage III  L breast biopsy 09/15/20; Neoadjuvant chemotherapy; L modified radical mastectomy and R prophylactic total mastectomy with immediate placement of tissue expanders 03/17/21; L ALND 7+/17. XRT completed 02/18/21. Marland Kitchen  Upcoming R capsulectomy scheduled 09/07/22. Chronic pain syndrome, chronic insomnia, OA B hands, Tobacco abuse, IBS, Anxiety, Recurrent Depression  PAIN:  Are you having pain? Yes.  Unchanged since 10/06/22 NPRS scale: not rated numerically Pain location: L lateral trunk, axilla, volar arm and forearm, chest wall Pain orientation L PAIN TYPE: pulling, discomfort, fullness, tightness Pain description: intermittent Aggravating factors: end range shoulder AROM, lifting, carrying, stretching Relieving factors: MLD, compression  PRECAUTIONS: Falls,  Lymphedema Precautions  HAND DOMINANCE : right   PRIOR LEVEL OF FUNCTION: Independent  PATIENT GOALS: make sure lymphedema doesn't get worse; reduce pain and discomfort in my hands and arm and trunk   OBJECTIVE   LYMPHEDEMA ASSESSMENTS:   FOTO FUNCTIONAL OUTCOME SCORE: Intake 59/100%  LYMPHEDEMA LIFE IMPACT SCALE (LLIS) Intake TBA 1st Rx visit. Pt did not complete backside of page  TODAY'S TREATMENT:  Pt edu LUE/LUQ MLD  PATIENT EDUCATION:  Education details: Pt edu for compression garment exchange process. Person educated: Patient Education method: Explanation, Demonstration, and Handouts. Pt edu completed for DME. 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 Ensure pad covers serratus anterior and L lateral trunk as well as chest wall. Pt is of petite stature so custom pas and sleeve may be required to achieve correct fit RECOMMENDED COMPRESSION: Custom Juzo, ccl 1 (20-30 mmHg) compression arm sleeve, x 2 Custom ccl 1 Juzo glove, x 2 JoviPak lumpectomy pad SMALL Belisse compression bras , x 2 Initially fitted 32 C, exchanged due to weight gain and awaiting 36 C   ASSESSMENT:  CLINICAL  IMPRESSION:  Pt denied increased pain in L axilla, trunk and chest wall during MLD. Post mastectomy pain in these areas has not reduced with CDT. Cording palpable deep in volar arm, this vessel palpates as too large to free manually. Pt will return for OT visit when  larger compression bra is delivered for fitting. No further manual therapy scheduled.   OBJECTIVE IMPAIRMENTS: decreased activity tolerance, decreased balance, decreased endurance, decreased knowledge of condition, decreased knowledge of use of DME, impaired LUE shoulder AROM, decreased strength, impaired flexibility, impaired sensation, impaired UE functional use, postural dysfunction, impaired dynamic balance, muscle weakness, pain, and increased lymphedema progression risk.   ACTIVITY LIMITATIONS: carrying,  lifting, sleeping, bed mobility, reach over head, hygiene/grooming, caring for others, working as Marine scientist, driving, meal prep, cooking  PARTICIPATION LIMITATIONS: cleaning, laundry, driving, shopping, occupation, and yard work  PERSONAL FACTORS: 3+ comorbidities: chronic pain, lumbar stenosis, chemo-induced neuropathy in all 4 limbs  are also affecting patient's functional outcome.   REHAB POTENTIAL: Good for limiting LE progression   GOALS: Goals reviewed with patient? Yes  SHORT TERM GOALS: Target date: 4th OT Rx visit    Pt will demonstrate understanding of lymphedema precautions and prevention strategies with modified independence using a printed reference to identify at least 5 precautions and discussing how s/he may implement them into daily life to reduce risk of progression and to limit infection risk. Baseline:Max A Goal status: 11/09/22 NOT MET   LONG TERM GOALS: Target date: 03/14/2023    Given this patient's Intake score of 59/100% on the functional outcomes FOTO tool, patient will experience an increase in function of 3 points  to improve basic and instrumental ADLs performance, including lymphedema  self-care. Baseline: 59/100% Goal status:  11/09/22 NOT MET  2.  Given this patient's Intake score of TBA/100% on the Lymphedema Life Impact Scale (LLIS), patient will experience an increase of 5 points in her perceived level of functional impairment resulting from lymphedema to improve functional performance and quality of life (QOL). Baseline: TBA Goal status:  11/09/22 NOT MET  3.  With assistive devices and modified independence (extra time) Pt will be able to don and doff appropriate compression garments and/or devices to control BLE lymphedema and to limit progression.  Baseline: Max A Goal status:  11/09/22 NOT MET  4.  Pt will be able to correctly perform all lymphedema self-care home program components using correct techniques with extra time and assistive devices PRN (modified independence), including simple self MLD, lymphatic pumping exercise, don and doff appropriate compression garments/ devices, and perform daily skin care regime to limit progression. Baseline: Max A Goal status:  11/09/22 NOT MET  PLAN:  PT FREQUENCY: 1-2x/week  PT DURATION: 12 weeks  PLANNED INTERVENTIONS: Therapeutic exercises, Therapeutic activity, Patient/Family education, Self Care, DME instructions, Manual lymph drainage, Taping, Manual therapy, and compression garment measurement , fitting and training  PLAN FOR NEXT SESSION:  Complete out take LLIS and FOTO Complete final limb volume  volumetrics and chest wall circumferential measurements Review lymphedema precautions Fit with Compression bra  Andrey Spearman, MS, OTR/L, CLT-LANA 12/20/22 1:18 PM

## 2022-12-21 ENCOUNTER — Encounter: Payer: Self-pay | Admitting: *Deleted

## 2022-12-21 ENCOUNTER — Inpatient Hospital Stay: Payer: Commercial Managed Care - HMO

## 2022-12-21 NOTE — Progress Notes (Signed)
Acupuncture approval through pink ribbon faxed to [employer].

## 2022-12-22 ENCOUNTER — Other Ambulatory Visit: Payer: Self-pay

## 2022-12-22 ENCOUNTER — Other Ambulatory Visit: Payer: Self-pay | Admitting: Family Medicine

## 2022-12-22 ENCOUNTER — Inpatient Hospital Stay: Payer: Commercial Managed Care - HMO

## 2022-12-22 MED ORDER — MIRABEGRON ER 50 MG PO TB24
50.0000 mg | ORAL_TABLET | Freq: Every day | ORAL | 1 refills | Status: DC
  Filled 2022-12-22: qty 30, 30d supply, fill #0
  Filled 2023-01-21: qty 30, 30d supply, fill #1

## 2022-12-23 ENCOUNTER — Other Ambulatory Visit: Payer: Self-pay

## 2022-12-25 ENCOUNTER — Inpatient Hospital Stay: Payer: Commercial Managed Care - HMO

## 2022-12-27 ENCOUNTER — Encounter: Payer: Self-pay | Admitting: Student in an Organized Health Care Education/Training Program

## 2022-12-27 ENCOUNTER — Ambulatory Visit: Payer: Commercial Managed Care - HMO | Admitting: Student in an Organized Health Care Education/Training Program

## 2022-12-27 ENCOUNTER — Ambulatory Visit: Payer: No Typology Code available for payment source

## 2022-12-27 ENCOUNTER — Ambulatory Visit: Payer: No Typology Code available for payment source | Admitting: Oncology

## 2022-12-27 ENCOUNTER — Other Ambulatory Visit: Payer: No Typology Code available for payment source

## 2022-12-27 VITALS — BP 126/70 | HR 90 | Temp 97.8°F | Ht 60.0 in | Wt 134.4 lb

## 2022-12-27 DIAGNOSIS — R0902 Hypoxemia: Secondary | ICD-10-CM | POA: Diagnosis not present

## 2022-12-27 DIAGNOSIS — J189 Pneumonia, unspecified organism: Secondary | ICD-10-CM

## 2022-12-27 DIAGNOSIS — R0602 Shortness of breath: Secondary | ICD-10-CM | POA: Diagnosis not present

## 2022-12-27 NOTE — Progress Notes (Signed)
Assessment & Plan:   1. Shortness of breath 2. Multifocal pneumonia  She had presented to clinic for the evaluation of shortness of breath in the setting of known breast cancer s/p chemotherapy, resection, and immune therapy. She received Abemaciclib over the summer which was discontinued in November of 2023. Her symptoms acutely worsened prompting admission to the hospital. She is now s/p bronchoscopy with BAL, with all infectious workup returning negative.   Her CT is showing multi-focal infiltrates with ground glass and consolidative opacities. Given the acute worsening of symptoms, my differential includes inflammatory conditions such as drug induced pneumonitis from abemaciclib, organizing pneumonia, hypersensitivity pneumonitis given mold exposure at home, and DIP/Desquamative Interstitial Pneumonia given smoking. Abemaciclib could have a sustained effect with pneumonitis, but it would be unusual for it to acutely worsen in this fashion sans re-introduction. Hypersensitivity pneumonitis, organizing pneumonia, and DIP are higher on my differential.  Today, she feels somewhat improved and her lung exam is clear, but she continues to be symptomatic. Given this, I will prolong her prednisone taper. I will drop her dose down to 30 mg daily for 2 more weeks, and then do 20 mg daily for 2 weeks after that. At that point she will get a repeat CT of the chest to re-evaluate for any lingering findings. I will also consider repeating PFT's in the future. Patient was counseled again regarding the importance of smoking cessation and encouraged to quit.  - CT CHEST WO CONTRAST; Future   Return in about 5 weeks (around 01/31/2023).  I spent 30 minutes caring for this patient today, including preparing to see the patient, obtaining a medical history , reviewing a separately obtained history, performing a medically appropriate examination and/or evaluation, counseling and educating the  patient/family/caregiver, ordering medications, tests, or procedures, and documenting clinical information in the electronic health record  Armando Reichert, MD [facility] 12/27/2022 8:13 PM    End of visit medications:  No orders of the defined types were placed in this encounter.    Current Outpatient Medications:    acetaminophen (TYLENOL) 650 MG CR tablet, Take 1,300 mg by mouth every 8 (eight) hours as needed for pain., Disp: , Rfl:    albuterol (PROVENTIL) (2.5 MG/3ML) 0.083% nebulizer solution, Take 3 mLs (2.5 mg total) by nebulization every 6 (six) hours as needed for wheezing or shortness of breath., Disp: 360 mL, Rfl: 12   albuterol (VENTOLIN HFA) 108 (90 Base) MCG/ACT inhaler, Inhale 2 puffs into the lungs every 6 (six) hours as needed for wheezing or shortness of breath., Disp: 6.7 g, Rfl: 0   anastrozole (ARIMIDEX) 1 MG tablet, Take 1 tablet (1 mg total) by mouth daily., Disp: 30 tablet, Rfl: 5   atovaquone (MEPRON) 750 MG/5ML suspension, Take 10 mLs (1,500 mg total) by mouth daily., Disp: 300 mL, Rfl: 1   b complex vitamins capsule, Take 1 capsule by mouth daily., Disp: , Rfl:    baclofen (LIORESAL) 10 MG tablet, Take 1 tablet (10 mg total) by mouth 3 (three) times daily as needed for muscle spasms., Disp: 60 tablet, Rfl: 2   Blood Glucose Monitoring Suppl (BLOOD GLUCOSE MONITOR SYSTEM) w/Device KIT, Test in the morning, at noon, and at bedtime., Disp: 1 kit, Rfl: 0   calcium-vitamin D (OSCAL WITH D) 500-5 MG-MCG tablet, Take 1 tablet by mouth daily with breakfast., Disp: , Rfl:    cholecalciferol (VITAMIN D3) 25 MCG (1000 UNIT) tablet, Take 1,000 Units by mouth daily., Disp: , Rfl:  cyanocobalamin (VITAMIN B12) 1000 MCG/ML injection, Inject 1 mL (1,000 mcg total) into the muscle every 30 (thirty) days., Disp: 3 mL, Rfl: 4   dextromethorphan-guaiFENesin (ROBITUSSIN-DM) 10-100 MG/5ML liquid, Take 10 mLs by mouth every 4 (four) hours as needed for cough., Disp:  , Rfl:    docusate sodium (COLACE) 100 MG capsule, Take 100 mg by mouth daily as needed for mild constipation., Disp: , Rfl:    FLUoxetine (PROZAC) 20 MG capsule, Take 1 capsule (20 mg total) by mouth every morning., Disp: 90 capsule, Rfl: 3   glucose blood test strip, Test in the morning, at noon, and at bedtime, Disp: 100 each, Rfl: 0   ibuprofen (ADVIL) 400 MG tablet, Take 400 mg by mouth every 6 (six) hours as needed., Disp: , Rfl:    Lactobacillus (PROBIOTIC ACIDOPHILUS PO), Take 1 capsule by mouth daily., Disp: , Rfl:    Lancet Device MISC, 1 each by Does not apply route in the morning, at noon, and at bedtime. May substitute to any manufacturer covered by patient's insurance., Disp: 1 each, Rfl: 0   Lancets (FREESTYLE) lancets, Test in the morning, at noon, and at bedtime., Disp: 100 each, Rfl: 0   LORazepam (ATIVAN) 0.5 MG tablet, Take 1 tablet (0.5 mg total) by mouth 2 (two) times daily as needed for anxiety., Disp: 60 tablet, Rfl: 2   mirabegron ER (MYRBETRIQ) 50 MG TB24 tablet, Take 1 tablet (50 mg total) by mouth daily., Disp: 30 tablet, Rfl: 1   Multiple Vitamins-Minerals (MULTIVITAL PO), Take 1 Dose by mouth daily., Disp: , Rfl:    naloxone (NARCAN) nasal spray 4 mg/0.1 mL, Place 1 spray into the nose as needed for opioid-induced respiratory depresssion. In case of emergency (overdose), spray once into each nostril. If no response within 3 minutes, repeat application and call [id number]., Disp: 2 each, Rfl: 0   nicotine (NICODERM CQ - DOSED IN MG/24 HOURS) 21 mg/24hr patch, Place 1 patch (21 mg total) onto the skin daily., Disp: 28 patch, Rfl: 0   oxyCODONE (OXY IR/ROXICODONE) 5 MG immediate release tablet, Take 1 tablet (5 mg total) by mouth 4 (four) times daily as needed., Disp: 120 tablet, Rfl: 0   predniSONE (DELTASONE) 20 MG tablet, Take 2 tablets (40 mg total) by mouth daily until seen by pulmonary to start taper, Disp: 60 tablet, Rfl: 1   pregabalin (LYRICA) 150 MG capsule, Take 1  capsule (150 mg total) by mouth in the morning, at noon, and at bedtime., Disp: 90 capsule, Rfl: 2   rosuvastatin (CRESTOR) 5 MG tablet, Take 5 mg by mouth daily., Disp: , Rfl:    trimethoprim (TRIMPEX) 100 MG tablet, Take 1 tablet (100 mg total) by mouth daily., Disp: 90 tablet, Rfl: 3   umeclidinium-vilanterol (ANORO ELLIPTA) 62.5-25 MCG/ACT AEPB, Inhale 1 puff into the lungs daily., Disp: 30 each, Rfl: 6   vitamin B-12 (CYANOCOBALAMIN) 100 MCG tablet, Take 100 mcg by mouth daily. gummy, Disp: , Rfl:    oxyCODONE (OXY IR/ROXICODONE) 5 MG immediate release tablet, Take 1 tablet (5 mg total) by mouth 2 (two) times daily. Must last 30 days. (Patient not taking: Reported on 12/15/2022), Disp: 60 tablet, Rfl: 0 No current facility-administered medications for this visit.  Facility-Administered Medications Ordered in Other Visits:    acetaminophen (TYLENOL) 325 MG tablet, , , ,    diphenhydrAMINE (BENADRYL) 25 mg capsule, , , ,    heparin lock flush 100 unit/mL, 500 Units, Intravenous, Once, Sindy Guadeloupe, MD  Zoledronic Acid (ZOMETA) 4 MG/100ML IVPB, , , ,    Subjective:   PATIENT ID: Tricia Berry GENDER: female DOB: Jan 18, 1971, MRN: [id number]  Chief Complaint  Patient presents with   Follow-up    SOB with exertion, dry cough and occ wheezing.     HPI  Ms. Conaway is a 52 year old female with a past medical history of breast cancer who developed an inflammatory pneumonia. She was admitted to the hospital for management of acute on chronic hypoxic respiratory failure and has underwent bronchoscopy on 12/01/2022. She is presenting today for follow up.   During her initial clinic visit, she reported insidious symptoms in November with shortness of breath and severe hypoxia down to 87% SpO2. CXR at the time showed findings concerning for pneumonia followed by CT scan showing multi-focal infiltrates. Her main symptoms were those of dyspnea and cough. The symptoms started in the summer  around the time the Abemaciclib was initiated.  She first noticed a cough that happened throughout the summer and culminated in the sudden onset of shortness of breath. Following this, she was initiated on a course of antibiotics and abemaciclib was halted.  Her symptoms have slowly resolved but she continues to report exertional dyspnea with some mild wheezing that occurs at night.  She was started on Breztri by her primary care physician which she feels was helpful.   A few days after our initial visit her symptoms worsened and she became increasingly short of breath, with increasing cough. She was seen by her primary care physician and was noted to be very hypoxic, and was sent to the ED. In addition to the dyspnea, she reports a cough as well as chills. She underwent a CT scan of the chest that showed multi-focal infiltrates and she was admitted for further management. I saw her in consultation in the hospital. Infectious workup sent included a beta-d-glucan, Aspergillus Glucomannan, and sputum induction for cultures and PJP PCR (cultures growing candida). She was starte don 40 mg of oral prednisone with Atovaquone for prophylaxis and was discharged on oxygen. On follow up, we arranged for bronchoscopy (BAL) performed on 2/9.  Today, she report feeling better, but still not back to her baseline. She feels she's at 6/10 of baseline. She is now off oxgyen during the day but continues to use it at night. Her main symptom is that of exertional dyspnea. She does report a cough. She is unfortunately smoking again (1/2 pack a day). She is on 40 mg of Prednisone and feels it's making her more anxious.   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  She has received a course of Pertuzumab and Trastuzumab following her resection. She had been on Abemaciclib which was  stopped in November of 2023 and has not been initiated since.   Patient used to work as an Warden/ranger. She is a smoker, and has smoked on and off since her teenage years. She reports having mold in their bathroom that was professionally remediated.  Ancillary information including prior medications, full medical/surgical/family/social histories, and PFTs (when available) are listed below and have been reviewed.   Review of Systems  Constitutional:  Negative for chills, fever and weight loss.  Respiratory:  Positive for cough and shortness of breath. Negative for hemoptysis and sputum production.   Skin:  Negative for rash.     Objective:   Vitals:  12/27/22 1343  BP: 126/70  Pulse: 90  Temp: 97.8 F (36.6 C)  TempSrc: Temporal  SpO2: 96%  Weight: 134 lb 6.4 oz (61 kg)  Height: 5' (1.524 m)   96% on RA BMI Readings from Last 3 Encounters:  12/27/22 26.25 kg/m  12/13/22 24.41 kg/m  12/08/22 24.37 kg/m   Wt Readings from Last 3 Encounters:  12/27/22 134 lb 6.4 oz (61 kg)  12/13/22 125 lb (56.7 kg)  12/08/22 129 lb (58.5 kg)    Physical Exam Constitutional:      General: She is not in acute distress.    Appearance: Normal appearance. She is not ill-appearing.  HENT:     Head: Normocephalic.     Mouth/Throat:     Mouth: Mucous membranes are moist.  Cardiovascular:     Rate and Rhythm: Normal rate and regular rhythm.     Pulses: Normal pulses.     Heart sounds: Normal heart sounds.  Pulmonary:     Effort: Pulmonary effort is normal.     Breath sounds: Normal breath sounds. No wheezing, rhonchi or rales.  Abdominal:     General: Abdomen is flat.     Palpations: Abdomen is soft.  Musculoskeletal:        General: Normal range of motion.     Right lower leg: No edema.     Left lower leg: No edema.  Neurological:     General: No focal deficit present.     Mental Status: She is alert and oriented to person, place, and time. Mental status is at baseline.      Ancillary Information    Past Medical History:  Diagnosis Date   Anemia    Anginal pain (HCC)    excertion not cardiac related per patient   Anxiety    Breast cancer ([place]) 12/2021   left breast IMC   Diverticulitis    Dyspnea    Dysrhythmia    prolong QT   Family history of ovarian cancer    GERD (gastroesophageal reflux disease)    Headache    IBS (irritable bowel syndrome)    Neuromuscular disorder (HCC)    neuropathy from chemo   Pneumonia    PONV (postoperative nausea and vomiting)    severe migraine and vomiting post anesthesia   Scoliosis      Family History  Problem Relation Age of Onset   Hypertension Mother    Osteoarthritis Mother    Diverticulitis Mother    Heart failure Father    Hypertension Father    Gout Father    Heart attack Father 27   Diverticulitis Brother    Ovarian cancer Paternal Grandmother      Past Surgical History:  Procedure Laterality Date   ABDOMINAL HYSTERECTOMY     still has ovaries, no gyn cancer, hysterectomy due to endometriosis. NO cervix on exam 01/10/21   BACK SURGERY     BREAST BIOPSY Left 09/15/2020   [place] bx of mass, path pending, Q marker   BREAST BIOPSY Left 09/15/2020   [place] bx of LN, hydromarker, path pending   BREAST RECONSTRUCTION WITH PLACEMENT OF TISSUE EXPANDER AND FLEX HD (ACELLULAR HYDRATED DERMIS) Bilateral 03/17/2021   Procedure: IMMEDIATE BILATERAL BREAST RECONSTRUCTION WITH PLACEMENT OF TISSUE EXPANDER AND FLEX HD (ACELLULAR HYDRATED DERMIS);  Surgeon: Wallace Going, DO;  Location: [place];  Service: Plastics;  Laterality: Bilateral;   CAPSULECTOMY Right 10/18/2022   Procedure: Right breast capsule release with adjustment;  Surgeon: Audelia Hives  S, DO;  Location: [place];  Service: Plastics;  Laterality: Right;   COLONOSCOPY WITH PROPOFOL N/A 11/21/2021   Procedure: COLONOSCOPY WITH PROPOFOL;  Surgeon: Lin Landsman, MD;  Location: [facility];   Service: Gastroenterology;  Laterality: N/A;   FLEXIBLE BRONCHOSCOPY Bilateral 12/01/2022   Procedure: FLEXIBLE BRONCHOSCOPY;  Surgeon: Armando Reichert, MD;  Location: [facility];  Service: Pulmonary;  Laterality: Bilateral;   IR IMAGING GUIDED PORT INSERTION  10/01/2020   MODIFIED MASTECTOMY Left 03/17/2021   Procedure: LEFT MODIFIED RADICAL MASTECTOMY;  Surgeon: Erroll Luna, MD;  Location: [place];  Service: General;  Laterality: Left;   PORTA CATH REMOVAL Right 03/17/2021   Procedure: PORTA CATH REMOVAL;  Surgeon: Erroll Luna, MD;  Location: [place];  Service: General;  Laterality: Right;   REMOVAL OF BILATERAL TISSUE EXPANDERS WITH PLACEMENT OF BILATERAL BREAST IMPLANTS Bilateral 05/09/2021   Procedure: REMOVAL OF BILATERAL TISSUE EXPANDERS WITH PLACEMENT OF BILATERAL BREAST IMPLANTS;  Surgeon: Wallace Going, DO;  Location: [place];  Service: Plastics;  Laterality: Bilateral;  90 min   TOTAL MASTECTOMY Right 03/17/2021   Procedure: TOTAL MASTECTOMY;  Surgeon: Erroll Luna, MD;  Location: [place];  Service: General;  Laterality: Right;    Social History   Socioeconomic History   Marital status: Significant Other    Spouse name: Not on file   Number of children: 2   Years of education: Not on file   Highest education level: Not on file  Occupational History   Occupation: Nurse    Employer: [employer]  Tobacco Use   Smoking status: Every Day    Packs/day: 1.00    Years: 20.00    Total pack years: 20.00    Types: Cigarettes   Smokeless tobacco: Never   Tobacco comments:    0.5PPD 12/27/2022  Vaping Use   Vaping Use: Never used  Substance and Sexual Activity   Alcohol use: Not Currently   Drug use: No   Sexual activity: Not Currently    Birth control/protection: Surgical    Comment: hyst  Other Topics Concern   Not on file  Social History Narrative   Patient works as an Warden/ranger at [employer].  She has 2 children at home who have special needs. She and her spouse are primary caregivers.    Social Determinants of Health   Financial Resource Strain: Not on file  Food Insecurity: No Food Insecurity (11/21/2022)   Hunger Vital Sign    Worried About Running Out of Food in the Last Year: Never true    Ran Out of Food in the Last Year: Never true  Transportation Needs: No Transportation Needs (11/21/2022)   PRAPARE - Hydrologist (Medical): No    Lack of Transportation (Non-Medical): No  Physical Activity: Not on file  Stress: Not on file  Social Connections: Not on file  Intimate Partner Violence: Not At Risk (11/21/2022)   Humiliation, Afraid, Rape, and Kick questionnaire    Fear of Current or Ex-Partner: No    Emotionally Abused: No    Physically Abused: No    Sexually Abused: No     Allergies  Allergen Reactions   Morphine Nausea And Vomiting and Other (See Comments)    migranes Other reaction(s): Headache Migraine, vomiting   Sertraline Hcl     REACTION: Worsened symptoms of IBS   Sulfa Antibiotics Rash   Sulfamethoxazole Rash   Sulfonamide Derivatives Rash  CBC    Component Value Date/Time   WBC 14.3 (H) 12/08/2022 1216   RBC 4.49 12/08/2022 1216   HGB 13.6 12/08/2022 1216   HGB 14.3 08/23/2020 1033   HCT 42.3 12/08/2022 1216   HCT 42.9 08/23/2020 1033   PLT 205 12/08/2022 1216   PLT 222 08/23/2020 1033   MCV 94.2 12/08/2022 1216   MCV 91 08/23/2020 1033   MCH 30.3 12/08/2022 1216   MCHC 32.2 12/08/2022 1216   RDW 15.1 12/08/2022 1216   RDW 13.1 08/23/2020 1033   LYMPHSABS 2.0 12/08/2022 1216   LYMPHSABS 4.2 (H) 08/23/2020 1033   MONOABS 0.7 12/08/2022 1216   EOSABS 0.0 12/08/2022 1216   EOSABS 0.1 08/23/2020 1033   BASOSABS 0.0 12/08/2022 1216   BASOSABS 0.1 08/23/2020 1033    Pulmonary Functions Testing Results:    Latest Ref Rng & Units 12/07/2022    1:58 PM  PFT Results  FVC-Pre L 1.95   FVC-Predicted Pre %  64   FVC-Post L 1.94   FVC-Predicted Post % 64   Pre FEV1/FVC % % 64   Post FEV1/FCV % % 60   FEV1-Pre L 1.24   FEV1-Predicted Pre % 52   FEV1-Post L 1.17   DLCO uncorrected ml/min/mmHg 6.69   DLCO UNC% % 36   DLVA Predicted % 58   TLC L 1.57   TLC % Predicted % 35   RV % Predicted % -17     Outpatient Medications Prior to Visit  Medication Sig Dispense Refill   acetaminophen (TYLENOL) 650 MG CR tablet Take 1,300 mg by mouth every 8 (eight) hours as needed for pain.     albuterol (PROVENTIL) (2.5 MG/3ML) 0.083% nebulizer solution Take 3 mLs (2.5 mg total) by nebulization every 6 (six) hours as needed for wheezing or shortness of breath. 360 mL 12   albuterol (VENTOLIN HFA) 108 (90 Base) MCG/ACT inhaler Inhale 2 puffs into the lungs every 6 (six) hours as needed for wheezing or shortness of breath. 6.7 g 0   anastrozole (ARIMIDEX) 1 MG tablet Take 1 tablet (1 mg total) by mouth daily. 30 tablet 5   atovaquone (MEPRON) 750 MG/5ML suspension Take 10 mLs (1,500 mg total) by mouth daily. 300 mL 1   b complex vitamins capsule Take 1 capsule by mouth daily.     baclofen (LIORESAL) 10 MG tablet Take 1 tablet (10 mg total) by mouth 3 (three) times daily as needed for muscle spasms. 60 tablet 2   Blood Glucose Monitoring Suppl (BLOOD GLUCOSE MONITOR SYSTEM) w/Device KIT Test in the morning, at noon, and at bedtime. 1 kit 0   calcium-vitamin D (OSCAL WITH D) 500-5 MG-MCG tablet Take 1 tablet by mouth daily with breakfast.     cholecalciferol (VITAMIN D3) 25 MCG (1000 UNIT) tablet Take 1,000 Units by mouth daily.     cyanocobalamin (VITAMIN B12) 1000 MCG/ML injection Inject 1 mL (1,000 mcg total) into the muscle every 30 (thirty) days. 3 mL 4   dextromethorphan-guaiFENesin (ROBITUSSIN-DM) 10-100 MG/5ML liquid Take 10 mLs by mouth every 4 (four) hours as needed for cough.     docusate sodium (COLACE) 100 MG capsule Take 100 mg by mouth daily as needed for mild constipation.     FLUoxetine (PROZAC)  20 MG capsule Take 1 capsule (20 mg total) by mouth every morning. 90 capsule 3   glucose blood test strip Test in the morning, at noon, and at bedtime 100 each 0   ibuprofen (ADVIL) 400  MG tablet Take 400 mg by mouth every 6 (six) hours as needed.     Lactobacillus (PROBIOTIC ACIDOPHILUS PO) Take 1 capsule by mouth daily.     Lancet Device MISC 1 each by Does not apply route in the morning, at noon, and at bedtime. May substitute to any manufacturer covered by patient's insurance. 1 each 0   Lancets (FREESTYLE) lancets Test in the morning, at noon, and at bedtime. 100 each 0   LORazepam (ATIVAN) 0.5 MG tablet Take 1 tablet (0.5 mg total) by mouth 2 (two) times daily as needed for anxiety. 60 tablet 2   mirabegron ER (MYRBETRIQ) 50 MG TB24 tablet Take 1 tablet (50 mg total) by mouth daily. 30 tablet 1   Multiple Vitamins-Minerals (MULTIVITAL PO) Take 1 Dose by mouth daily.     naloxone (NARCAN) nasal spray 4 mg/0.1 mL Place 1 spray into the nose as needed for opioid-induced respiratory depresssion. In case of emergency (overdose), spray once into each nostril. If no response within 3 minutes, repeat application and call [id number]. 2 each 0   nicotine (NICODERM CQ - DOSED IN MG/24 HOURS) 21 mg/24hr patch Place 1 patch (21 mg total) onto the skin daily. 28 patch 0   oxyCODONE (OXY IR/ROXICODONE) 5 MG immediate release tablet Take 1 tablet (5 mg total) by mouth 4 (four) times daily as needed. 120 tablet 0   predniSONE (DELTASONE) 20 MG tablet Take 2 tablets (40 mg total) by mouth daily until seen by pulmonary to start taper 60 tablet 1   pregabalin (LYRICA) 150 MG capsule Take 1 capsule (150 mg total) by mouth in the morning, at noon, and at bedtime. 90 capsule 2   rosuvastatin (CRESTOR) 5 MG tablet Take 5 mg by mouth daily.     trimethoprim (TRIMPEX) 100 MG tablet Take 1 tablet (100 mg total) by mouth daily. 90 tablet 3   umeclidinium-vilanterol (ANORO ELLIPTA) 62.5-25 MCG/ACT AEPB Inhale 1 puff into the  lungs daily. 30 each 6   vitamin B-12 (CYANOCOBALAMIN) 100 MCG tablet Take 100 mcg by mouth daily. gummy     oxyCODONE (OXY IR/ROXICODONE) 5 MG immediate release tablet Take 1 tablet (5 mg total) by mouth 2 (two) times daily. Must last 30 days. (Patient not taking: Reported on 12/15/2022) 60 tablet 0   Facility-Administered Medications Prior to Visit  Medication Dose Route Frequency Provider Last Rate Last Admin   acetaminophen (TYLENOL) 325 MG tablet            diphenhydrAMINE (BENADRYL) 25 mg capsule            heparin lock flush 100 unit/mL  500 Units Intravenous Once Sindy Guadeloupe, MD       Zoledronic Acid (ZOMETA) 4 MG/100ML IVPB

## 2022-12-27 NOTE — Patient Instructions (Addendum)
Take 30 mg of prednisone (1.5 pills) for 2 weeks Then lower to 20 mg of prednisone (1 pill) after that We will get a CT scan in 4 weeks and I will see you for follow up after that

## 2022-12-28 ENCOUNTER — Other Ambulatory Visit: Payer: Self-pay | Admitting: Family

## 2022-12-28 DIAGNOSIS — M545 Low back pain, unspecified: Secondary | ICD-10-CM

## 2022-12-29 ENCOUNTER — Encounter: Payer: Self-pay | Admitting: Oncology

## 2022-12-29 ENCOUNTER — Inpatient Hospital Stay: Payer: Commercial Managed Care - HMO | Attending: Oncology

## 2022-12-29 ENCOUNTER — Other Ambulatory Visit: Payer: Self-pay

## 2022-12-29 ENCOUNTER — Inpatient Hospital Stay: Payer: Commercial Managed Care - HMO

## 2022-12-29 DIAGNOSIS — C50412 Malignant neoplasm of upper-outer quadrant of left female breast: Secondary | ICD-10-CM | POA: Diagnosis present

## 2022-12-29 DIAGNOSIS — Z170 Estrogen receptor positive status [ER+]: Secondary | ICD-10-CM

## 2022-12-29 DIAGNOSIS — Z5111 Encounter for antineoplastic chemotherapy: Secondary | ICD-10-CM | POA: Insufficient documentation

## 2022-12-29 DIAGNOSIS — Z79899 Other long term (current) drug therapy: Secondary | ICD-10-CM | POA: Diagnosis not present

## 2022-12-29 DIAGNOSIS — C50212 Malignant neoplasm of upper-inner quadrant of left female breast: Secondary | ICD-10-CM

## 2022-12-29 LAB — COMPREHENSIVE METABOLIC PANEL
ALT: 40 U/L (ref 0–44)
AST: 24 U/L (ref 15–41)
Albumin: 4.2 g/dL (ref 3.5–5.0)
Alkaline Phosphatase: 55 U/L (ref 38–126)
Anion gap: 9 (ref 5–15)
BUN: 13 mg/dL (ref 6–20)
CO2: 26 mmol/L (ref 22–32)
Calcium: 9.1 mg/dL (ref 8.9–10.3)
Chloride: 103 mmol/L (ref 98–111)
Creatinine, Ser: 1.18 mg/dL — ABNORMAL HIGH (ref 0.44–1.00)
GFR, Estimated: 56 mL/min — ABNORMAL LOW (ref >60)
Glucose, Bld: 151 mg/dL — ABNORMAL HIGH (ref 70–99)
Potassium: 3.5 mmol/L (ref 3.5–5.1)
Sodium: 138 mmol/L (ref 135–145)
Total Bilirubin: 0.7 mg/dL (ref 0.3–1.2)
Total Protein: 7.4 g/dL (ref 6.5–8.1)

## 2022-12-29 MED ORDER — GOSERELIN ACETATE 3.6 MG ~~LOC~~ IMPL
3.6000 mg | DRUG_IMPLANT | SUBCUTANEOUS | Status: DC
  Administered 2022-12-29: 3.6 mg via SUBCUTANEOUS
  Filled 2022-12-29: qty 3.6

## 2022-12-29 MED ORDER — PREGABALIN 150 MG PO CAPS
150.0000 mg | ORAL_CAPSULE | Freq: Three times a day (TID) | ORAL | 2 refills | Status: DC
  Filled 2022-12-29: qty 90, 30d supply, fill #0
  Filled 2023-01-21 – 2023-01-25 (×2): qty 90, 30d supply, fill #1
  Filled 2023-03-11: qty 90, 30d supply, fill #2

## 2023-01-02 ENCOUNTER — Other Ambulatory Visit: Payer: Self-pay

## 2023-01-02 MED ORDER — OXYCODONE HCL 5 MG PO TABS
5.0000 mg | ORAL_TABLET | ORAL | 0 refills | Status: DC | PRN
  Filled 2023-01-02: qty 120, 20d supply, fill #0

## 2023-01-03 LAB — FUNGUS CULTURE WITH STAIN

## 2023-01-03 LAB — FUNGUS CULTURE RESULT

## 2023-01-03 LAB — FUNGAL ORGANISM REFLEX

## 2023-01-04 ENCOUNTER — Ambulatory Visit: Payer: Commercial Managed Care - HMO | Attending: Nurse Practitioner | Admitting: Occupational Therapy

## 2023-01-04 DIAGNOSIS — I972 Postmastectomy lymphedema syndrome: Secondary | ICD-10-CM | POA: Diagnosis present

## 2023-01-04 NOTE — Therapy (Signed)
OUTPATIENT OCCUPATIONAL THERAPY TREATMENT NOTE, PROGRESS REPORT and DISCHARGE SUMMARY  POST MASTECTOMY LUE/LUQ UPPER LYMPHEDEMA  Patient Name: Tricia Berry MRN: [id number] DOB:1971-06-03, 52 y.o., female Today's Date: 01/04/2023    OT End of Session - 01/04/23 1329     Visit Number 9    Number of Visits 36    Date for OT Re-Evaluation 11/20/22    OT Start Time 0900    OT Stop Time 0930    OT Time Calculation (min) 30 min    Activity Tolerance Patient tolerated treatment well;No increased pain    Behavior During Therapy WFL for tasks assessed/performed              Past Medical History:  Diagnosis Date   Anemia    Anginal pain (HCC)    excertion not cardiac related per patient   Anxiety    Breast cancer ([place]) 12/2021   left breast IMC   Diverticulitis    Dyspnea    Dysrhythmia    prolong QT   Family history of ovarian cancer    GERD (gastroesophageal reflux disease)    Headache    IBS (irritable bowel syndrome)    Neuromuscular disorder (HCC)    neuropathy from chemo   Pneumonia    PONV (postoperative nausea and vomiting)    severe migraine and vomiting post anesthesia   Scoliosis    Past Surgical History:  Procedure Laterality Date   ABDOMINAL HYSTERECTOMY     still has ovaries, no gyn cancer, hysterectomy due to endometriosis. NO cervix on exam 01/10/21   BACK SURGERY     BREAST BIOPSY Left 09/15/2020   [place] bx of mass, path pending, Q marker   BREAST BIOPSY Left 09/15/2020   [place] bx of LN, hydromarker, path pending   BREAST RECONSTRUCTION WITH PLACEMENT OF TISSUE EXPANDER AND FLEX HD (ACELLULAR HYDRATED DERMIS) Bilateral 03/17/2021   Procedure: IMMEDIATE BILATERAL BREAST RECONSTRUCTION WITH PLACEMENT OF TISSUE EXPANDER AND FLEX HD (ACELLULAR HYDRATED DERMIS);  Surgeon: Wallace Going, DO;  Location: [place];  Service: Plastics;  Laterality: Bilateral;   CAPSULECTOMY Right 10/18/2022   Procedure: Right breast capsule release with  adjustment;  Surgeon: Wallace Going, DO;  Location: [place];  Service: Plastics;  Laterality: Right;   COLONOSCOPY WITH PROPOFOL N/A 11/21/2021   Procedure: COLONOSCOPY WITH PROPOFOL;  Surgeon: Lin Landsman, MD;  Location: [facility];  Service: Gastroenterology;  Laterality: N/A;   FLEXIBLE BRONCHOSCOPY Bilateral 12/01/2022   Procedure: FLEXIBLE BRONCHOSCOPY;  Surgeon: Armando Reichert, MD;  Location: [facility];  Service: Pulmonary;  Laterality: Bilateral;   IR IMAGING GUIDED PORT INSERTION  10/01/2020   MODIFIED MASTECTOMY Left 03/17/2021   Procedure: LEFT MODIFIED RADICAL MASTECTOMY;  Surgeon: Erroll Luna, MD;  Location: [place];  Service: General;  Laterality: Left;   PORTA CATH REMOVAL Right 03/17/2021   Procedure: PORTA CATH REMOVAL;  Surgeon: Erroll Luna, MD;  Location: [place];  Service: General;  Laterality: Right;   REMOVAL OF BILATERAL TISSUE EXPANDERS WITH PLACEMENT OF BILATERAL BREAST IMPLANTS Bilateral 05/09/2021   Procedure: REMOVAL OF BILATERAL TISSUE EXPANDERS WITH PLACEMENT OF BILATERAL BREAST IMPLANTS;  Surgeon: Wallace Going, DO;  Location: [place];  Service: Plastics;  Laterality: Bilateral;  90 min   TOTAL MASTECTOMY Right 03/17/2021   Procedure: TOTAL MASTECTOMY;  Surgeon: Erroll Luna, MD;  Location: [place];  Service: General;  Laterality: Right;   Patient Active Problem List  Diagnosis Date Noted   Brain lesion 12/08/2022   Shortness of breath 12/07/2022   Fatigue 12/06/2022   Jaw pain 12/06/2022   Thrush 11/24/2022   Impaired fasting glucose 11/24/2022   Hyperlipidemia, unspecified 11/22/2022   Hypoxia 11/21/2022   Acute on chronic respiratory failure with hypoxia ([place]) 11/21/2022   Chronic sacroiliac joint pain (Bilateral) 10/03/2022    Class: Chronic   Chronic hip pain (Left) 10/03/2022    Class: Chronic   Multifocal pneumonia 08/29/2022    SOB (shortness of breath) 08/29/2022   Breast asymmetry following reconstructive surgery 08/15/2022   Lumbosacral facet arthropathy (Multilevel) (Bilateral) 07/05/2022   Lumbar spinal facet joint arthropathy with concurrent effusion (L3-4) (Right) 07/05/2022   Degenerative lumbar rotatory levoscoliosis 07/05/2022   Lumbosacral foraminal stenosis (Bilateral: L3-4) (Left: L4-5, L5-S1) 07/05/2022   Lumbosacral lateral recess stenosis (Left: L4-5) (Bilateral: L5-S1) 07/05/2022   Lumbar nerve root impingement (Bilateral: L3-4, L5-S1) (Left: L4-5) 07/05/2022   Long term prescription benzodiazepine use 07/05/2022   Chronic use of opiate for therapeutic purpose 07/05/2022   Lumbosacral facet syndrome (Bilateral) 07/05/2022   Pain medication agreement signed (12/07/21) 05/29/2022   History of breast cancer (Left) 05/29/2022   History of mastectomy (Bilateral) 05/29/2022   Chronic calf pain (2ry area of Pain) (Bilateral) 05/29/2022   Chronic lower extremity pain (3ry area of Pain) (Bilateral) 05/29/2022   Chronic feet pain (Bilateral) 05/29/2022   Chronic upper extremity pain (4th area of Pain) (Bilateral) (L>R) 05/29/2022   Chronic hand pain (Bilateral) (L>R) 05/29/2022   Chronic pain 05/29/2022   Abnormal MRI, lumbar spine (10/13/2021) 05/29/2022   Chronic pain syndrome 05/28/2022   Pharmacologic therapy 05/28/2022   Disorder of skeletal system 05/28/2022   Problems influencing health status 05/28/2022   Family history of heart disease 11/16/2021   Acquired absence of breast 08/16/2021   B12 deficiency 07/21/2021   Atherosclerosis of aorta ([place]) 07/20/2021   Hepatic steatosis 07/20/2021   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   Genetic testing 11/22/2020   Family history of ovarian cancer    Candidal vulvovaginitis 11/01/2020   Chronic low back pain (1ry area of Pain) (Bilateral) (R>L) w/o sciatica 10/06/2020   Encounter for medical examination to establish care 09/26/2020    Malignant neoplasm of left female breast ([place]) 09/26/2020   Sprain of lumbar region 03/08/2010   INTERNAL HEMORRHOIDS 01/27/2010   Anal fissure 01/27/2010   Osteoarthrosis, hand 01/04/2010   Arthralgia 10/05/2009   INSOMNIA, CHRONIC 05/06/2009   ALLERGIC RHINITIS, SEASONAL 02/09/2009   Tobacco abuse 12/29/2008   IBS 10/13/2008   Anxiety state 08/25/2008   DEPRESSION, RECURRENT 08/25/2008    PCP: Burnard Hawthorne, FPN  REFERRING PROVIDER: Verlon Au, NP  REFERRING DIAG: 197.2  THERAPY DIAG:  LUE/ LUQ Post-mastectomy lymphedema syndrome  ONSET DATE: 03/17/21  SUBJECTIVE  SUBJECTIVE STATEMENT:Tricia Berry presents for OT to address L breast cancer related lymphedema and associated pain. Pt reports "pulling" sensation in her axilla is less frequent, but it persists when she reaches out to the side in abduction. Pt does not rate LE-related discomfort numerically today.   PERTINENT HISTORY: L Breast Ca ER+, PR+, HER2-, stage III  L breast biopsy 09/15/20; Neoadjuvant chemotherapy; L modified radical mastectomy and R prophylactic total mastectomy with immediate placement of tissue expanders 03/17/21; L ALND 7+/17. XRT completed 02/18/21. Marland Kitchen  Upcoming R capsulectomy scheduled 09/07/22. Chronic pain syndrome, chronic insomnia, OA B hands, Tobacco abuse, IBS, Anxiety, Recurrent Depression  PAIN:  Are you having pain? Yes. Unchanged since 10/06/22 NPRS scale: not rated numerically Pain location: L lateral trunk, axilla, volar arm and forearm, chest wall Pain orientation L PAIN TYPE: pulling, discomfort, fullness, tightness Pain description: intermittent Aggravating factors: end range shoulder AROM, lifting, carrying, stretching Relieving factors: MLD, compression  PRECAUTIONS: Falls,  Lymphedema  Precautions  HAND DOMINANCE : right   PRIOR LEVEL OF FUNCTION: Independent  PATIENT GOALS: make sure lymphedema doesn't get worse; reduce pain and discomfort in my hands and arm and trunk   OBJECTIVE   LYMPHEDEMA ASSESSMENTS:   FOTO FUNCTIONAL OUTCOME SCORE: Intake 59/100%  LYMPHEDEMA LIFE IMPACT SCALE (LLIS) Intake TBA 1st Rx visit. Pt did not complete backside of page  TODAY'S TREATMENT:  Pt edu: reviewed LE precautions Completed [place] compression garment fitting and training  PATIENT EDUCATION:  Education details: Compression bra wear regime and care instructions. Reviewed lymphedema precautions and prevention strategies Person educated: Patient Education method: Explanation, Demonstration, and Handouts. Pt edu completed for DME. Education comprehension: good. 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 Ensure pad covers serratus anterior and L lateral trunk as well as chest wall. Pt is of petite stature so custom pas and sleeve may be required to achieve correct fit RECOMMENDED COMPRESSION: Prophylactic Custom Juzo, ccl 1 (20-30 mmHg) compression arm sleeve, x 1 Prophylactic Custom ccl 1 Juzo glove x 1 JoviPak lumpectomy pad SMALL bilateral [place] compression bras , x 1, 36 C   ASSESSMENT:  CLINICAL IMPRESSION:  Pt denied increased pain in L axilla, trunk and chest wall during MLD. Post mastectomy pain in these areas has not reduced with CDT. Cording palpable deep in volar arm, this vessel palpates as too large to free manually. Today's session marks end of  compression garment fitting and training. GOAL MET. Smaller [place] compression bra fits very well. Pt will add convoluted Jovi padding to bra pockets then she arrives home. She did not bring them to clinic today. Pt reports comfort in garment. Pt is able to don and doff garment independently with a little extra time. Although Pt continues to c/o discomfort and pain in L axilla at end ranges, lateral trunk and L arm pain are nearly resolved and Pt demonstrates full, pain free shoulder AROM. She has obtained recommended compression garments and understands wear and care regimes as a prophylactic measure. Pt is independent with lymphedema  precautions and prevention strategies and all home program components. Pain  in the L arm, axilla and lateral trunk adjacent to axilla suspected to  be due to axillary cording and fibrosis are significantly reduced and functional arm use and activity level is increasing. Pt is discharged from OT today. Most goals are met. Please don't hesitate to all w questions or concerns. AS new referral is needed to resume care.  OBJECTIVE IMPAIRMENTS: decreased activity tolerance, decreased balance, decreased endurance, decreased knowledge of condition, decreased knowledge of use of DME, impaired LUE shoulder AROM, decreased strength, impaired flexibility, impaired sensation, impaired UE functional use, postural dysfunction, impaired dynamic balance, muscle weakness, pain, and increased lymphedema progression risk.   ACTIVITY LIMITATIONS: carrying, lifting, sleeping, bed mobility, reach over head, hygiene/grooming, caring for others, working as Marine scientist, driving, meal prep, cooking  PARTICIPATION LIMITATIONS: cleaning, laundry, driving, shopping, occupation, and yard work  PERSONAL FACTORS: 3+ comorbidities: chronic pain, lumbar stenosis, chemo-induced neuropathy in all 4 limbs  are also affecting patient's functional outcome.   REHAB POTENTIAL: Good for limiting LE  progression   GOALS: Goals reviewed with patient? Yes  SHORT TERM GOALS: Target date: 4th OT Rx visit    Pt will demonstrate understanding of lymphedema precautions and prevention strategies with modified independence using a printed reference to identify at least 5 precautions and discussing how s/he may implement them into daily life to reduce risk of progression and to limit infection risk. Baseline:Max A Goal status: 01/04/23 GOAL MET   LONG TERM GOALS: Target date: 03/29/2023    Given this patient's Intake score of 59/100% on the functional outcomes FOTO tool, patient will experience an increase in function of 3 points  to improve basic and instrumental ADLs performance, including lymphedema self-care. Baseline: 59/100% Goal status:  11/09/22 NOT MET- DEFERRED  2.  Given this patient's Intake score of TBA/100% on the Lymphedema Life Impact Scale (LLIS), patient will experience an increase of 5 points in her perceived level of functional impairment resulting from lymphedema to improve functional performance and quality of life (QOL). Baseline: TBA Goal status: 01/04/23 DEFERRED  3.  With assistive devices and modified independence (extra time) Pt will be able to don and doff appropriate compression garments and/or devices to control BLE lymphedema and to limit progression.  Baseline: Max A Goal status:  01/04/23 GOAL MET  4.  Pt will be able to correctly perform all lymphedema self-care home program components using correct techniques with extra time and assistive devices PRN (modified independence), including simple self MLD, lymphatic pumping exercise, don and doff appropriate compression garments/ devices, and perform daily skin care regime to limit progression. Baseline: Max A Goal status:  01/04/23 GOAL MET  PLAN:  PT FREQUENCY: DC OT this date  PT DURATION: call PRN  PLANNED INTERVENTIONS: Therapeutic exercises, Therapeutic activity, Patient/Family education, Self Care, DME  instructions, Manual lymph drainage, Taping, Manual therapy, and compression garment measurement , fitting and training  PLAN FOR NEXT SESSION:   Andrey Spearman, MS, OTR/L, CLT-LANA 01/04/23 1:32 PM

## 2023-01-05 ENCOUNTER — Other Ambulatory Visit: Payer: Commercial Managed Care - HMO

## 2023-01-05 ENCOUNTER — Ambulatory Visit (INDEPENDENT_AMBULATORY_CARE_PROVIDER_SITE_OTHER): Payer: Commercial Managed Care - HMO | Admitting: Surgical

## 2023-01-05 DIAGNOSIS — N651 Disproportion of reconstructed breast: Secondary | ICD-10-CM

## 2023-01-05 NOTE — Progress Notes (Signed)
Patient is a 52 year old female who underwent bilateral nipple areola tattooing 3 weeks ago, she presents via telephone to discuss how the healing is going.  She reports that overall she healed very quickly after using Vaseline and nonstick gauze for 1 week, however the left side started to then peel a week later and she had a thick areas of skin peeling.  She reports that there is no signs of concern or infection, but the area that is peeling is the area that was radiated.  She reports that the color is lighter than she expected and would like to consider going darker but she is going to think this over.    The patient gave consent to have this visit done by telemedicine / virtual visit, two identifiers were used to identify patient. This is also consent for access the chart and treat the patient via this visit. The patient is located in [place].  I, the provider, am at the office.  We spent 4 minutes together for the visit.  Joined by telephone.  I will plan to see the patient in 3 weeks for additional tattoo, recommend calling with questions or concerns or following up sooner if she has any changes.

## 2023-01-09 ENCOUNTER — Encounter: Payer: Commercial Managed Care - HMO | Admitting: Plastic Surgery

## 2023-01-11 ENCOUNTER — Ambulatory Visit
Admission: RE | Admit: 2023-01-11 | Discharge: 2023-01-11 | Disposition: A | Discharge status: Home / Self Care | Payer: Commercial Managed Care - HMO | Source: Ambulatory Visit | Attending: Student in an Organized Health Care Education/Training Program | Admitting: Student in an Organized Health Care Education/Training Program

## 2023-01-11 DIAGNOSIS — R0602 Shortness of breath: Secondary | ICD-10-CM

## 2023-01-11 LAB — FUNGAL ORGANISM REFLEX

## 2023-01-11 LAB — FUNGUS CULTURE WITH STAIN

## 2023-01-11 LAB — FUNGUS CULTURE RESULT

## 2023-01-12 ENCOUNTER — Ambulatory Visit: Payer: Commercial Managed Care - HMO

## 2023-01-16 ENCOUNTER — Ambulatory Visit (INDEPENDENT_AMBULATORY_CARE_PROVIDER_SITE_OTHER): Payer: Commercial Managed Care - HMO | Admitting: Plastic Surgery

## 2023-01-16 ENCOUNTER — Telehealth: Payer: Self-pay | Admitting: Family Medicine

## 2023-01-16 ENCOUNTER — Encounter: Payer: Self-pay | Admitting: Plastic Surgery

## 2023-01-16 VITALS — BP 120/79 | HR 86 | Ht 60.0 in | Wt 136.2 lb

## 2023-01-16 DIAGNOSIS — N651 Disproportion of reconstructed breast: Secondary | ICD-10-CM

## 2023-01-16 DIAGNOSIS — Z853 Personal history of malignant neoplasm of breast: Secondary | ICD-10-CM

## 2023-01-16 NOTE — Telephone Encounter (Signed)
Patient sent a Mychart message stating Myrbetriq was denied. I left message for patient to return call. Myrbetriq is on the preferred drug plan and if the medication. She stated there is a letter being faxed to our office. I will look at the letter, so far there is no PA request posted on Cover My Meds.

## 2023-01-16 NOTE — Progress Notes (Signed)
   Subjective:    Patient ID: Tricia Berry, female    DOB: 10-18-1971, 52 y.o.   MRN: [id number]  The patient is a 52 year old female here for follow-up after undergoing breast reconstruction.  Her original diagnosis was left breast invasive mammary carcinoma that was estrogen progesterone positive and HER2 negative.  She underwent bilateral mastectomies with expander placement in May 2022.  In July 2022 she had the expanders removed and implants placed they are saline with 320 cc in the implants.  She had some bottoming out of the right breast likely because the left has had radiation.  So a right breast capsulectomy and repositioning was done with good improvement.  This will likely go down again on the right side because it is not radiated.  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  She has gained weight as a result.      Review of Systems  Constitutional: Negative.   HENT: Negative.    Eyes: Negative.   Respiratory: Negative.    Cardiovascular: Negative.   Gastrointestinal: Negative.   Endocrine: Negative.   Genitourinary: Negative.   Musculoskeletal: Negative.        Objective:   Physical Exam Constitutional:      Appearance: Normal appearance.  Cardiovascular:     Rate and Rhythm: Normal rate.     Pulses: Normal pulses.  Pulmonary:     Effort: Pulmonary effort is normal.  Abdominal:     Palpations: Abdomen is soft.  Neurological:     Mental Status: She is alert and oriented to person, place, and time.  Psychiatric:        Mood and Affect: Mood normal.        Behavior: Behavior normal.        Thought Content: Thought content normal.           Assessment & Plan:     ICD-10-CM   1. Breast asymmetry following reconstructive surgery  N65.1     2. History of breast cancer (Left)  Z85.3        The patient will let us know when she is off the steroid and then we can talk about fat grafting for  revision.  Pictures were obtained of the patient and placed in the chart with the patient's or guardian's permission.

## 2023-01-18 ENCOUNTER — Other Ambulatory Visit: Payer: Self-pay

## 2023-01-18 LAB — ACID FAST CULTURE WITH REFLEXED SENSITIVITIES (MYCOBACTERIA): Acid Fast Culture: NEGATIVE

## 2023-01-18 MED ORDER — GENTAMICIN SULFATE 0.1 % EX OINT
TOPICAL_OINTMENT | Freq: Three times a day (TID) | CUTANEOUS | 12 refills | Status: DC
  Filled 2023-01-18: qty 30, 30d supply, fill #0
  Filled 2023-07-23: qty 30, 30d supply, fill #1

## 2023-01-19 ENCOUNTER — Inpatient Hospital Stay: Payer: Commercial Managed Care - HMO

## 2023-01-21 ENCOUNTER — Other Ambulatory Visit: Payer: Self-pay | Admitting: Family

## 2023-01-21 ENCOUNTER — Other Ambulatory Visit: Payer: Self-pay

## 2023-01-22 ENCOUNTER — Encounter: Payer: Self-pay | Admitting: Oncology

## 2023-01-22 ENCOUNTER — Other Ambulatory Visit: Payer: Self-pay

## 2023-01-22 MED ORDER — OXYCODONE HCL 5 MG PO TABS
5.0000 mg | ORAL_TABLET | ORAL | 0 refills | Status: DC | PRN
  Filled 2023-01-22: qty 120, 20d supply, fill #0

## 2023-01-22 MED ORDER — NICOTINE 21 MG/24HR TD PT24
21.0000 mg | MEDICATED_PATCH | Freq: Every day | TRANSDERMAL | 1 refills | Status: DC
  Filled 2023-01-22: qty 28, 28d supply, fill #0
  Filled 2023-02-21: qty 28, 28d supply, fill #1

## 2023-01-23 ENCOUNTER — Other Ambulatory Visit: Payer: Self-pay

## 2023-01-24 ENCOUNTER — Encounter: Payer: Self-pay | Admitting: Oncology

## 2023-01-24 ENCOUNTER — Inpatient Hospital Stay: Payer: Commercial Managed Care - HMO | Admitting: Occupational Therapy

## 2023-01-24 ENCOUNTER — Other Ambulatory Visit: Payer: Self-pay

## 2023-01-26 ENCOUNTER — Ambulatory Visit (INDEPENDENT_AMBULATORY_CARE_PROVIDER_SITE_OTHER): Payer: Commercial Managed Care - HMO | Admitting: Surgical

## 2023-01-26 ENCOUNTER — Other Ambulatory Visit: Payer: Self-pay

## 2023-01-26 ENCOUNTER — Encounter: Payer: Self-pay | Admitting: Surgical

## 2023-01-26 ENCOUNTER — Inpatient Hospital Stay: Payer: Commercial Managed Care - HMO

## 2023-01-26 VITALS — HR 83

## 2023-01-26 DIAGNOSIS — Z170 Estrogen receptor positive status [ER+]: Secondary | ICD-10-CM | POA: Diagnosis not present

## 2023-01-26 DIAGNOSIS — Z9011 Acquired absence of right breast and nipple: Secondary | ICD-10-CM

## 2023-01-26 DIAGNOSIS — F17200 Nicotine dependence, unspecified, uncomplicated: Secondary | ICD-10-CM

## 2023-01-26 DIAGNOSIS — Z923 Personal history of irradiation: Secondary | ICD-10-CM | POA: Diagnosis not present

## 2023-01-26 DIAGNOSIS — Z853 Personal history of malignant neoplasm of breast: Secondary | ICD-10-CM

## 2023-01-26 DIAGNOSIS — Z9013 Acquired absence of bilateral breasts and nipples: Secondary | ICD-10-CM

## 2023-01-26 DIAGNOSIS — N651 Disproportion of reconstructed breast: Secondary | ICD-10-CM

## 2023-01-26 LAB — ACID FAST CULTURE WITH REFLEXED SENSITIVITIES (MYCOBACTERIA): Acid Fast Culture: NEGATIVE

## 2023-01-26 NOTE — Progress Notes (Signed)
NIPPLE AREOLAR TATTOO PROCEDURE  PREOPERATIVE DIAGNOSIS:  Acquired absence of right nipple areolar  History of radiation to the left breast  POSTOPERATIVE DIAGNOSIS: Acquired absence of right nipple areolar    PROCEDURES: right nipple areolar tattoo   ANESTHESIA:  none  COMPLICATIONS: None.  JUSTIFICATION FOR PROCEDURE:  Tricia Berry is a 52 y.o. female with a history of breast cancer status post breast reconstruction. The patient presents for nipple areolar complex tattoo. Risks, benefits, indications, and alternatives of the above described procedures were discussed with the patient and all the patient's questions were answered. Consent was signed.  Patient had a lot of shading after her last tattoo, she is interested in going darker today.  She is aware that the left side has faded more due to the history of radiation.  She otherwise is happy with the location of the nipple areola.  DESCRIPTION OF PROCEDURE: After informed consent was obtained and proper identification of patient and surgical site was made, the patient was taken to the procedure room and resting in procedure chair. The patient was prepped and draped in the usual sterile fashion. Attention was turned to the selection of flesh colored permanent tattoo ink to produce the appropriate color for nipple areolar complex tattooing. Color, size and location was confirmed with the patient. Pre-op photos were obtained and placed in patient chart with patient consent.   Using a Digital Revo 7R pronged tattoo head, pigment was instilled to the designed nipple areolar complex, which was confirmed preoperatively with the patient. Once adequate pigment had been applied to the nipple areolar complex, vaseline followed by an occlusive dressing was applied. The patient tolerated the procedure well. There were no complications  The total area tattooed was 18 cm, each areola was 9 cm  Cool honey and fair honey was used to create the  desired areolar color. 4 parts cool honey 2 parts fair honey Cool honey and dark honey was used to create the desired shading 2 parts cool honey, 1 part dark honey  All of the inks expired 2026.

## 2023-01-30 ENCOUNTER — Other Ambulatory Visit: Payer: Self-pay

## 2023-01-31 ENCOUNTER — Telehealth (INDEPENDENT_AMBULATORY_CARE_PROVIDER_SITE_OTHER): Payer: Commercial Managed Care - HMO | Admitting: Student in an Organized Health Care Education/Training Program

## 2023-01-31 ENCOUNTER — Encounter: Payer: Self-pay | Admitting: Family

## 2023-01-31 ENCOUNTER — Ambulatory Visit: Payer: Commercial Managed Care - HMO | Admitting: Family

## 2023-01-31 VITALS — BP 130/68 | HR 86 | Temp 98.3°F | Ht 60.0 in | Wt 136.2 lb

## 2023-01-31 DIAGNOSIS — J189 Pneumonia, unspecified organism: Secondary | ICD-10-CM | POA: Diagnosis not present

## 2023-01-31 DIAGNOSIS — Z92241 Personal history of systemic steroid therapy: Secondary | ICD-10-CM

## 2023-01-31 DIAGNOSIS — J9621 Acute and chronic respiratory failure with hypoxia: Secondary | ICD-10-CM

## 2023-01-31 NOTE — Patient Instructions (Signed)
Referral to ophthalmology.Let us know if you dont hear back within a week in regards to an appointment being scheduled.   So that you are aware, if you are [facility] MyChart user , please pay attention to your MyChart messages as you may receive a MyChart message with a phone number to call and schedule this test/appointment own your own from our referral coordinator. This is a new process so I do not want you to miss this message.  If you are not a MyChart user, you will receive a phone call.

## 2023-01-31 NOTE — Progress Notes (Signed)
Assessment & Plan:  History of recent steroid use Assessment & Plan: Discussed risk of increased intraocular pressure, cataracts in setting long-term prednisone use.  Placed referral to ophthalmology for dilated eye exam.  Hopefully as of today after she sees pulmonology she will be off of prednisone soon.  Orders: -     Ambulatory referral to Ophthalmology     Return precautions given.   Risks, benefits, and alternatives of the medications and treatment plan prescribed today were discussed, and patient expressed understanding.   Education regarding symptom management and diagnosis given to patient on AVS either electronically or printed.  No follow-ups on file.  Rennie Plowman, FNP  Subjective:    Patient ID: Tricia Berry, female    DOB: [birth year], 52 y.o.   MRN: [id number]  CC: Tricia Berry is a 52 y.o. female who presents today for follow up.   HPI: She complains of 'fuzzy' eye vision, which has been in right eye and starting to occur in left eye. Onset with starting prednisone.  No vision loss, HA, vomiting.  She can see faces. She has never been great at driving at night and reports this is unchanged. She wears glasses    She has gained 16lbs on prednisone   Follow-up pulmonology Dr. Aundria Rud 12/27/2022 She continues to be on prolonged prednisone taper decreased from 40 mg to 20 mg.  Plan to repeat CT chest to reevaluate, considering repeat PFTs.  Follow-up oncology Dr. Smith Robert 03/02/2023 History of bilateral double mastectomy, status post reconstruction  Allergies: Morphine, Sertraline hcl, Sulfa antibiotics, Sulfamethoxazole, and Sulfonamide derivatives Current Outpatient Medications on File Prior to Visit  Medication Sig Dispense Refill   acetaminophen (TYLENOL) 650 MG CR tablet Take 1,300 mg by mouth every 8 (eight) hours as needed for pain.     albuterol (PROVENTIL) (2.5 MG/3ML) 0.083% nebulizer solution Take 3 mLs (2.5 mg total) by nebulization every 6  (six) hours as needed for wheezing or shortness of breath. 360 mL 12   albuterol (VENTOLIN HFA) 108 (90 Base) MCG/ACT inhaler Inhale 2 puffs into the lungs every 6 (six) hours as needed for wheezing or shortness of breath. 6.7 g 0   anastrozole (ARIMIDEX) 1 MG tablet Take 1 tablet (1 mg total) by mouth daily. 30 tablet 5   atovaquone (MEPRON) 750 MG/5ML suspension Take 10 mLs (1,500 mg total) by mouth daily. 300 mL 1   b complex vitamins capsule Take 1 capsule by mouth daily.     baclofen (LIORESAL) 10 MG tablet Take 1 tablet (10 mg total) by mouth 3 (three) times daily as needed for muscle spasms. 60 tablet 2   Blood Glucose Monitoring Suppl (BLOOD GLUCOSE MONITOR SYSTEM) w/Device KIT Test in the morning, at noon, and at bedtime. 1 kit 0   calcium-vitamin D (OSCAL WITH D) 500-5 MG-MCG tablet Take 1 tablet by mouth daily with breakfast.     cholecalciferol (VITAMIN D3) 25 MCG (1000 UNIT) tablet Take 1,000 Units by mouth daily.     cyanocobalamin (VITAMIN B12) 1000 MCG/ML injection Inject 1 mL (1,000 mcg total) into the muscle every 30 (thirty) days. 3 mL 4   dextromethorphan-guaiFENesin (ROBITUSSIN-DM) 10-100 MG/5ML liquid Take 10 mLs by mouth every 4 (four) hours as needed for cough.     docusate sodium (COLACE) 100 MG capsule Take 100 mg by mouth daily as needed for mild constipation.     FLUoxetine (PROZAC) 20 MG capsule Take 1 capsule (20 mg total) by mouth every morning. 90 capsule  3   gentamicin ointment (GARAMYCIN) 0.1 % Apply to nose 3 (three) times daily. 30 g 12   ibuprofen (ADVIL) 400 MG tablet Take 400 mg by mouth every 6 (six) hours as needed.     Lactobacillus (PROBIOTIC ACIDOPHILUS PO) Take 1 capsule by mouth daily.     LORazepam (ATIVAN) 0.5 MG tablet Take 1 tablet (0.5 mg total) by mouth 2 (two) times daily as needed for anxiety. 60 tablet 2   mirabegron ER (MYRBETRIQ) 50 MG TB24 tablet Take 1 tablet (50 mg total) by mouth daily. 30 tablet 1   Multiple Vitamins-Minerals (MULTIVITAL  PO) Take 1 Dose by mouth daily.     naloxone (NARCAN) nasal spray 4 mg/0.1 mL Place 1 spray into the nose as needed for opioid-induced respiratory depresssion. In case of emergency (overdose), spray once into each nostril. If no response within 3 minutes, repeat application and call 911. 2 each 0   nicotine (NICODERM CQ - DOSED IN MG/24 HOURS) 21 mg/24hr patch Place 1 patch (21 mg total) onto the skin daily. 28 patch 1   oxyCODONE (OXY IR/ROXICODONE) 5 MG immediate release tablet Take 1 tablet (5 mg total) by mouth every 4 (four) hours as needed for pain. 120 tablet 0   predniSONE (DELTASONE) 20 MG tablet Take 2 tablets (40 mg total) by mouth daily until seen by pulmonary to start taper 60 tablet 1   pregabalin (LYRICA) 150 MG capsule Take 1 capsule (150 mg total) by mouth in the morning, at noon, and at bedtime. 90 capsule 2   rosuvastatin (CRESTOR) 5 MG tablet Take 5 mg by mouth daily.     trimethoprim (TRIMPEX) 100 MG tablet Take 1 tablet (100 mg total) by mouth daily. 90 tablet 3   umeclidinium-vilanterol (ANORO ELLIPTA) 62.5-25 MCG/ACT AEPB Inhale 1 puff into the lungs daily. 30 each 6   vitamin B-12 (CYANOCOBALAMIN) 100 MCG tablet Take 100 mcg by mouth daily. gummy     oxyCODONE (OXY IR/ROXICODONE) 5 MG immediate release tablet Take 1 tablet (5 mg total) by mouth 2 (two) times daily. Must last 30 days. 60 tablet 0   Current Facility-Administered Medications on File Prior to Visit  Medication Dose Route Frequency Provider Last Rate Last Admin   acetaminophen (TYLENOL) 325 MG tablet            diphenhydrAMINE (BENADRYL) 25 mg capsule            heparin lock flush 100 unit/mL  500 Units Intravenous Once Creig Hines, MD       Zoledronic Acid (ZOMETA) 4 MG/100ML IVPB             Review of Systems  Constitutional:  Negative for chills and fever.  Eyes:  Positive for visual disturbance.  Respiratory:  Negative for cough.   Cardiovascular:  Negative for chest pain and palpitations.   Gastrointestinal:  Negative for nausea and vomiting.  Neurological:  Negative for headaches.      Objective:    BP 130/68   Pulse 86   Temp 98.3 F (36.8 C) (Oral)   Ht 5' (1.524 m)   Wt 136 lb 3.2 oz (61.8 kg)   SpO2 96%   BMI 26.60 kg/m  BP Readings from Last 3 Encounters:  01/31/23 130/68  01/16/23 120/79  12/27/22 126/70   Wt Readings from Last 3 Encounters:  01/31/23 136 lb 3.2 oz (61.8 kg)  01/16/23 136 lb 3.2 oz (61.8 kg)  12/27/22 134 lb 6.4 oz (61 kg)  Physical Exam Vitals reviewed.  Constitutional:      Appearance: She is well-developed.  Eyes:     Conjunctiva/sclera: Conjunctivae normal.  Cardiovascular:     Rate and Rhythm: Normal rate and regular rhythm.     Pulses: Normal pulses.     Heart sounds: Normal heart sounds.  Pulmonary:     Effort: Pulmonary effort is normal.     Breath sounds: Normal breath sounds. No wheezing, rhonchi or rales.  Skin:    General: Skin is warm and dry.  Neurological:     Mental Status: She is alert.  Psychiatric:        Speech: Speech normal.        Behavior: Behavior normal.        Thought Content: Thought content normal.

## 2023-01-31 NOTE — Progress Notes (Addendum)
Assessment & Plan:   1. Shortness of breath 2. Multifocal pneumonia   She had presented to clinic for the evaluation of shortness of breath in the setting of known breast cancer s/p chemotherapy, resection, and immune therapy. She received Abemaciclib over the summer which was discontinued in November of 2023. Her symptoms acutely worsened prompting admission to the hospital. She is now s/p bronchoscopy with BAL, with all infectious workup returning negative.   Her initial CT showed multi-focal infiltrates with ground glass and consolidative opacities. My differential includes inflammatory conditions such as drug induced pneumonitis from abemaciclib, organizing pneumonia, hypersensitivity pneumonitis given mold exposure at home, and DIP/Desquamative Interstitial Pneumonia given smoking. Abemaciclib could have a sustained effect with pneumonitis, but it would be unusual for it to acutely worsen in this fashion sans re-introduction. Hypersensitivity pneumonitis, organizing pneumonia, and DIP are higher on my differential.  Repeat CT scan last month is showing notable improvement in the infiltrates, with minimal residual inflammation. She has received 40 mg of prednisone for around 5 to 6 weeks, and 30 mg of prednisone for 2 weeks. She has been on 20 mg of daily prednisone for 2 weeks. She has felt notably better but continues to feel some exertional dyspnea. She is now only using her oxygen nocturnally.  I will continue her at her current dose of prednisone, and slowly taper this over the course of a few months. We will do two more months of 20 mg prednisone daily, followed by 2 months of 10 mg prednisone daily. She will continue using the Atovaquone while on prednisone 20 mg daily, and can discontinue once down to 10 mg daily. I will see Tricia Berry in person in 4 months' time, at which point we will re-assess symptoms and consider repeat imaging and obtaining a pulmonary function test.  Finally,  she reports having quit smoking. I encouraged her to continue to abstain from cigarettes.  -Continue prednisone 20 mg daily for 2 more months -Then decrease to prednisone 10 mg daily for 2 months  Return in about 4 months (around 06/02/2023).  I spent 25 minutes caring for this patient today, including preparing to see the patient, obtaining a medical history , reviewing a separately obtained history, counseling and educating the patient/family/caregiver, ordering medications, tests, or procedures, and documenting clinical information in the electronic health record  Raechel ChuteKhabib Shanquita Ronning, MD [facility] 01/31/2023 1:37 PM    End of visit medications:  No orders of the defined types were placed in this encounter.    Current Outpatient Medications:    acetaminophen (TYLENOL) 650 MG CR tablet, Take 1,300 mg by mouth every 8 (eight) hours as needed for pain., Disp: , Rfl:    albuterol (PROVENTIL) (2.5 MG/3ML) 0.083% nebulizer solution, Take 3 mLs (2.5 mg total) by nebulization every 6 (six) hours as needed for wheezing or shortness of breath., Disp: 360 mL, Rfl: 12   albuterol (VENTOLIN HFA) 108 (90 Base) MCG/ACT inhaler, Inhale 2 puffs into the lungs every 6 (six) hours as needed for wheezing or shortness of breath., Disp: 6.7 g, Rfl: 0   anastrozole (ARIMIDEX) 1 MG tablet, Take 1 tablet (1 mg total) by mouth daily., Disp: 30 tablet, Rfl: 5   atovaquone (MEPRON) 750 MG/5ML suspension, Take 10 mLs (1,500 mg total) by mouth daily., Disp: 300 mL, Rfl: 1   b complex vitamins capsule, Take 1 capsule by mouth daily., Disp: , Rfl:    baclofen (LIORESAL) 10 MG tablet, Take 1 tablet (10  mg total) by mouth 3 (three) times daily as needed for muscle spasms., Disp: 60 tablet, Rfl: 2   Blood Glucose Monitoring Suppl (BLOOD GLUCOSE MONITOR SYSTEM) w/Device KIT, Test in the morning, at noon, and at bedtime., Disp: 1 kit, Rfl: 0   calcium-vitamin D (OSCAL WITH D) 500-5 MG-MCG tablet, Take 1  tablet by mouth daily with breakfast., Disp: , Rfl:    cholecalciferol (VITAMIN D3) 25 MCG (1000 UNIT) tablet, Take 1,000 Units by mouth daily., Disp: , Rfl:    cyanocobalamin (VITAMIN B12) 1000 MCG/ML injection, Inject 1 mL (1,000 mcg total) into the muscle every 30 (thirty) days., Disp: 3 mL, Rfl: 4   dextromethorphan-guaiFENesin (ROBITUSSIN-DM) 10-100 MG/5ML liquid, Take 10 mLs by mouth every 4 (four) hours as needed for cough., Disp: , Rfl:    docusate sodium (COLACE) 100 MG capsule, Take 100 mg by mouth daily as needed for mild constipation., Disp: , Rfl:    FLUoxetine (PROZAC) 20 MG capsule, Take 1 capsule (20 mg total) by mouth every morning., Disp: 90 capsule, Rfl: 3   gentamicin ointment (GARAMYCIN) 0.1 %, Apply to nose 3 (three) times daily., Disp: 30 g, Rfl: 12   ibuprofen (ADVIL) 400 MG tablet, Take 400 mg by mouth every 6 (six) hours as needed., Disp: , Rfl:    Lactobacillus (PROBIOTIC ACIDOPHILUS PO), Take 1 capsule by mouth daily., Disp: , Rfl:    LORazepam (ATIVAN) 0.5 MG tablet, Take 1 tablet (0.5 mg total) by mouth 2 (two) times daily as needed for anxiety., Disp: 60 tablet, Rfl: 2   mirabegron ER (MYRBETRIQ) 50 MG TB24 tablet, Take 1 tablet (50 mg total) by mouth daily., Disp: 30 tablet, Rfl: 1   Multiple Vitamins-Minerals (MULTIVITAL PO), Take 1 Dose by mouth daily., Disp: , Rfl:    naloxone (NARCAN) nasal spray 4 mg/0.1 mL, Place 1 spray into the nose as needed for opioid-induced respiratory depresssion. In case of emergency (overdose), spray once into each nostril. If no response within 3 minutes, repeat application and call 911., Disp: 2 each, Rfl: 0   nicotine (NICODERM CQ - DOSED IN MG/24 HOURS) 21 mg/24hr patch, Place 1 patch (21 mg total) onto the skin daily., Disp: 28 patch, Rfl: 1   oxyCODONE (OXY IR/ROXICODONE) 5 MG immediate release tablet, Take 1 tablet (5 mg total) by mouth every 4 (four) hours as needed for pain., Disp: 120 tablet, Rfl: 0   predniSONE (DELTASONE) 20  MG tablet, Take 2 tablets (40 mg total) by mouth daily until seen by pulmonary to start taper, Disp: 60 tablet, Rfl: 1   pregabalin (LYRICA) 150 MG capsule, Take 1 capsule (150 mg total) by mouth in the morning, at noon, and at bedtime., Disp: 90 capsule, Rfl: 2   rosuvastatin (CRESTOR) 5 MG tablet, Take 5 mg by mouth daily., Disp: , Rfl:    trimethoprim (TRIMPEX) 100 MG tablet, Take 1 tablet (100 mg total) by mouth daily., Disp: 90 tablet, Rfl: 3   umeclidinium-vilanterol (ANORO ELLIPTA) 62.5-25 MCG/ACT AEPB, Inhale 1 puff into the lungs daily., Disp: 30 each, Rfl: 6   vitamin B-12 (CYANOCOBALAMIN) 100 MCG tablet, Take 100 mcg by mouth daily. gummy, Disp: , Rfl:    oxyCODONE (OXY IR/ROXICODONE) 5 MG immediate release tablet, Take 1 tablet (5 mg total) by mouth 2 (two) times daily. Must last 30 days., Disp: 60 tablet, Rfl: 0 No current facility-administered medications for this visit.  Facility-Administered Medications Ordered in Other Visits:    acetaminophen (TYLENOL) 325 MG tablet, , , ,  diphenhydrAMINE (BENADRYL) 25 mg capsule, , , ,    heparin lock flush 100 unit/mL, 500 Units, Intravenous, Once, Creig Hines, MD   Zoledronic Acid (ZOMETA) 4 MG/100ML IVPB, , , ,    Subjective:   PATIENT ID: Tricia Berry GENDER: female DOB: 07/23/1971, MRN: [id number]  Chief Complaint  Patient presents with   Follow-up    Dry cough at times prod with tan sputum at night and in the morning.     HPI  Tricia Berry is a 52 year old female with a past medical history of breast cancer who developed an inflammatory pneumonia. She was admitted to the hospital for management of acute on chronic hypoxic respiratory failure and has underwent bronchoscopy on 12/01/2022.  I connected with  Zamani Crocker Fiser on 01/31/23 by a video enabled telemedicine application and verified that I am speaking with the correct person using two identifiers. I connected from my office while Tricia Berry connected from  her home. I discussed the limitations of evaluation and management by telemedicine. The patient expressed understanding and agreed to proceed.   During her initial clinic visit, she reported insidious symptoms in November with shortness of breath and severe hypoxia down to 87% SpO2. CXR at the time showed findings concerning for pneumonia followed by CT scan showing multi-focal infiltrates. Her main symptoms were those of dyspnea and cough. The symptoms started in the summer around the time the Abemaciclib was initiated.  She first noticed a cough that happened throughout the summer and culminated in the sudden onset of shortness of breath. Following this, she was initiated on a course of antibiotics and abemaciclib was halted.  Her symptoms slowly resolved but she reported exertional dyspnea.  She was started on Breztri by her primary care physician with some improvement.   A few days after our initial visit her symptoms worsened and she became increasingly short of breath, with increasing cough. She was seen by her primary care physician and was noted to be very hypoxic, and was sent to the ED. In addition to the dyspnea, she reports a cough as well as chills. She underwent a CT scan of the chest that showed multi-focal infiltrates and she was admitted for further management. I saw her in consultation in the hospital. Infectious workup sent included a beta-d-glucan, Aspergillus Glucomannan, and sputum induction for cultures and PJP PCR (cultures growing candida). She was started on 40 mg of oral prednisone with Atovaquone for prophylaxis and was discharged on oxygen. On follow up, we arranged for bronchoscopy (BAL) performed on 2/9.  Following our last visit, she continued on PO prednisone that we have tapered to 20 mg daily. She had a repeat chest CT on 01/11/2023 and the call today is to discuss its results. She feels that her symptoms are improved, but she continues to be short of breath with exertion.    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  She has received a course of Pertuzumab and Trastuzumab following her resection. She had been on Abemaciclib which was stopped in November of 2023 and has not been initiated since.   Patient used to work as an Insurance underwriter. She is a smoker, and has smoked on and off since her teenage years. She reports having mold in their bathroom that was professionally remediated.  Ancillary information including prior medications, full medical/surgical/family/social histories, and PFTs (when available) are  listed below and have been reviewed.   Review of Systems  Constitutional:  Negative for chills, fever and weight loss.  Respiratory:  Positive for shortness of breath. Negative for cough, hemoptysis and sputum production.   Skin:  Negative for rash.     Objective:  There were no vitals filed for this visit.  BMI Readings from Last 3 Encounters:  01/31/23 26.60 kg/m  01/16/23 26.60 kg/m  12/27/22 26.25 kg/m   Wt Readings from Last 3 Encounters:  01/31/23 136 lb 3.2 oz (61.8 kg)  01/16/23 136 lb 3.2 oz (61.8 kg)  12/27/22 134 lb 6.4 oz (61 kg)     Ancillary Information    Past Medical History:  Diagnosis Date   Anemia    Anginal pain    excertion not cardiac related per patient   Anxiety    Breast cancer 12/2021   left breast IMC   Diverticulitis    Dyspnea    Dysrhythmia    prolong QT   Family history of ovarian cancer    GERD (gastroesophageal reflux disease)    Headache    IBS (irritable bowel syndrome)    Neuromuscular disorder    neuropathy from chemo   Pneumonia    PONV (postoperative nausea and vomiting)    severe migraine and vomiting post anesthesia   Scoliosis      Family History  Problem Relation Age of Onset   Hypertension Mother    Osteoarthritis Mother     Diverticulitis Mother    Heart failure Father    Hypertension Father    Gout Father    Heart attack Father 9   Diverticulitis Brother    Ovarian cancer Paternal Grandmother      Past Surgical History:  Procedure Laterality Date   ABDOMINAL HYSTERECTOMY     still has ovaries, no gyn cancer, hysterectomy due to endometriosis. NO cervix on exam 01/10/21   BACK SURGERY     BREAST BIOPSY Left 09/15/2020   [place] bx of mass, path pending, Q marker   BREAST BIOPSY Left 09/15/2020   [place] bx of LN, hydromarker, path pending   BREAST RECONSTRUCTION WITH PLACEMENT OF TISSUE EXPANDER AND FLEX HD (ACELLULAR HYDRATED DERMIS) Bilateral 03/17/2021   Procedure: IMMEDIATE BILATERAL BREAST RECONSTRUCTION WITH PLACEMENT OF TISSUE EXPANDER AND FLEX HD (ACELLULAR HYDRATED DERMIS);  Surgeon: Peggye Form, DO;  Location: [facility];  Service: Plastics;  Laterality: Bilateral;   CAPSULECTOMY Right 10/18/2022   Procedure: Right breast capsule release with adjustment;  Surgeon: Peggye Form, DO;  Location: [facility];  Service: Plastics;  Laterality: Right;   COLONOSCOPY WITH PROPOFOL N/A 11/21/2021   Procedure: COLONOSCOPY WITH PROPOFOL;  Surgeon: Toney Reil, MD;  Location: [facility];  Service: Gastroenterology;  Laterality: N/A;   FLEXIBLE BRONCHOSCOPY Bilateral 12/01/2022   Procedure: FLEXIBLE BRONCHOSCOPY;  Surgeon: Raechel Chute, MD;  Location: [facility];  Service: Pulmonary;  Laterality: Bilateral;   IR IMAGING GUIDED PORT INSERTION  10/01/2020   MODIFIED MASTECTOMY Left 03/17/2021   Procedure: LEFT MODIFIED RADICAL MASTECTOMY;  Surgeon: Harriette Bouillon, MD;  Location: [facility];  Service: General;  Laterality: Left;   PORTA CATH REMOVAL Right 03/17/2021   Procedure: PORTA CATH REMOVAL;  Surgeon: Harriette Bouillon, MD;  Location:  SURGERY CENTER;  Service: General;  Laterality: Right;   REMOVAL OF BILATERAL TISSUE EXPANDERS WITH  PLACEMENT OF BILATERAL BREAST IMPLANTS Bilateral 05/09/2021   Procedure: REMOVAL OF BILATERAL TISSUE EXPANDERS WITH PLACEMENT OF BILATERAL  BREAST IMPLANTS;  Surgeon: Peggye Form, DO;  Location: [facility];  Service: Plastics;  Laterality: Bilateral;  90 min   TOTAL MASTECTOMY Right 03/17/2021   Procedure: TOTAL MASTECTOMY;  Surgeon: Harriette Bouillon, MD;  Location: [facility];  Service: General;  Laterality: Right;    Social History   Socioeconomic History   Marital status: Significant Other    Spouse name: Not on file   Number of children: 2   Years of education: Not on file   Highest education level: Not on file  Occupational History   Occupation: Nurse    Employer: [employer]  Tobacco Use   Smoking status: Every Day    Packs/day: 1.00    Years: 20.00    Additional pack years: 0.00    Total pack years: 20.00    Types: Cigarettes   Smokeless tobacco: Never   Tobacco comments:    0.5PPD 12/27/2022  Vaping Use   Vaping Use: Never used  Substance and Sexual Activity   Alcohol use: Not Currently   Drug use: No   Sexual activity: Not Currently    Birth control/protection: Surgical    Comment: hyst  Other Topics Concern   Not on file  Social History Narrative   Patient works as an Insurance underwriter at [employer]. She has 2 children at home who have special needs. She and her spouse are primary caregivers.    Social Determinants of Health   Financial Resource Strain: Not on file  Food Insecurity: No Food Insecurity (11/21/2022)   Hunger Vital Sign    Worried About Running Out of Food in the Last Year: Never true    Ran Out of Food in the Last Year: Never true  Transportation Needs: No Transportation Needs (11/21/2022)   PRAPARE - Administrator, Civil Service (Medical): No    Lack of Transportation (Non-Medical): No  Physical Activity: Not on file  Stress: Not on file  Social Connections: Not on file  Intimate Partner Violence: Not At  Risk (11/21/2022)   Humiliation, Afraid, Rape, and Kick questionnaire    Fear of Current or Ex-Partner: No    Emotionally Abused: No    Physically Abused: No    Sexually Abused: No     Allergies  Allergen Reactions   Morphine Nausea And Vomiting and Other (See Comments)    migranes Other reaction(s): Headache Migraine, vomiting   Sertraline Hcl     REACTION: Worsened symptoms of IBS   Sulfa Antibiotics Rash   Sulfamethoxazole Rash   Sulfonamide Derivatives Rash     CBC    Component Value Date/Time   WBC 14.3 (H) 12/08/2022 1216   RBC 4.49 12/08/2022 1216   HGB 13.6 12/08/2022 1216   HGB 14.3 08/23/2020 1033   HCT 42.3 12/08/2022 1216   HCT 42.9 08/23/2020 1033   PLT 205 12/08/2022 1216   PLT 222 08/23/2020 1033   MCV 94.2 12/08/2022 1216   MCV 91 08/23/2020 1033   MCH 30.3 12/08/2022 1216   MCHC 32.2 12/08/2022 1216   RDW 15.1 12/08/2022 1216   RDW 13.1 08/23/2020 1033   LYMPHSABS 2.0 12/08/2022 1216   LYMPHSABS 4.2 (H) 08/23/2020 1033   MONOABS 0.7 12/08/2022 1216   EOSABS 0.0 12/08/2022 1216   EOSABS 0.1 08/23/2020 1033   BASOSABS 0.0 12/08/2022 1216   BASOSABS 0.1 08/23/2020 1033    Pulmonary Functions Testing Results:    Latest Ref Rng & Units 12/07/2022    1:58  PM  PFT Results  FVC-Pre L 1.95   FVC-Predicted Pre % 64   FVC-Post L 1.94   FVC-Predicted Post % 64   Pre FEV1/FVC % % 64   Post FEV1/FCV % % 60   FEV1-Pre L 1.24   FEV1-Predicted Pre % 52   FEV1-Post L 1.17   DLCO uncorrected ml/min/mmHg 6.69   DLCO UNC% % 36   DLVA Predicted % 58   TLC L 1.57   TLC % Predicted % 35   RV % Predicted % -17     Outpatient Medications Prior to Visit  Medication Sig Dispense Refill   acetaminophen (TYLENOL) 650 MG CR tablet Take 1,300 mg by mouth every 8 (eight) hours as needed for pain.     albuterol (PROVENTIL) (2.5 MG/3ML) 0.083% nebulizer solution Take 3 mLs (2.5 mg total) by nebulization every 6 (six) hours as needed for wheezing or shortness of  breath. 360 mL 12   albuterol (VENTOLIN HFA) 108 (90 Base) MCG/ACT inhaler Inhale 2 puffs into the lungs every 6 (six) hours as needed for wheezing or shortness of breath. 6.7 g 0   anastrozole (ARIMIDEX) 1 MG tablet Take 1 tablet (1 mg total) by mouth daily. 30 tablet 5   atovaquone (MEPRON) 750 MG/5ML suspension Take 10 mLs (1,500 mg total) by mouth daily. 300 mL 1   b complex vitamins capsule Take 1 capsule by mouth daily.     baclofen (LIORESAL) 10 MG tablet Take 1 tablet (10 mg total) by mouth 3 (three) times daily as needed for muscle spasms. 60 tablet 2   Blood Glucose Monitoring Suppl (BLOOD GLUCOSE MONITOR SYSTEM) w/Device KIT Test in the morning, at noon, and at bedtime. 1 kit 0   calcium-vitamin D (OSCAL WITH D) 500-5 MG-MCG tablet Take 1 tablet by mouth daily with breakfast.     cholecalciferol (VITAMIN D3) 25 MCG (1000 UNIT) tablet Take 1,000 Units by mouth daily.     cyanocobalamin (VITAMIN B12) 1000 MCG/ML injection Inject 1 mL (1,000 mcg total) into the muscle every 30 (thirty) days. 3 mL 4   dextromethorphan-guaiFENesin (ROBITUSSIN-DM) 10-100 MG/5ML liquid Take 10 mLs by mouth every 4 (four) hours as needed for cough.     docusate sodium (COLACE) 100 MG capsule Take 100 mg by mouth daily as needed for mild constipation.     FLUoxetine (PROZAC) 20 MG capsule Take 1 capsule (20 mg total) by mouth every morning. 90 capsule 3   gentamicin ointment (GARAMYCIN) 0.1 % Apply to nose 3 (three) times daily. 30 g 12   ibuprofen (ADVIL) 400 MG tablet Take 400 mg by mouth every 6 (six) hours as needed.     Lactobacillus (PROBIOTIC ACIDOPHILUS PO) Take 1 capsule by mouth daily.     LORazepam (ATIVAN) 0.5 MG tablet Take 1 tablet (0.5 mg total) by mouth 2 (two) times daily as needed for anxiety. 60 tablet 2   mirabegron ER (MYRBETRIQ) 50 MG TB24 tablet Take 1 tablet (50 mg total) by mouth daily. 30 tablet 1   Multiple Vitamins-Minerals (MULTIVITAL PO) Take 1 Dose by mouth daily.     naloxone  (NARCAN) nasal spray 4 mg/0.1 mL Place 1 spray into the nose as needed for opioid-induced respiratory depresssion. In case of emergency (overdose), spray once into each nostril. If no response within 3 minutes, repeat application and call 911. 2 each 0   nicotine (NICODERM CQ - DOSED IN MG/24 HOURS) 21 mg/24hr patch Place 1 patch (21 mg total) onto the  skin daily. 28 patch 1   oxyCODONE (OXY IR/ROXICODONE) 5 MG immediate release tablet Take 1 tablet (5 mg total) by mouth every 4 (four) hours as needed for pain. 120 tablet 0   predniSONE (DELTASONE) 20 MG tablet Take 2 tablets (40 mg total) by mouth daily until seen by pulmonary to start taper 60 tablet 1   pregabalin (LYRICA) 150 MG capsule Take 1 capsule (150 mg total) by mouth in the morning, at noon, and at bedtime. 90 capsule 2   rosuvastatin (CRESTOR) 5 MG tablet Take 5 mg by mouth daily.     trimethoprim (TRIMPEX) 100 MG tablet Take 1 tablet (100 mg total) by mouth daily. 90 tablet 3   umeclidinium-vilanterol (ANORO ELLIPTA) 62.5-25 MCG/ACT AEPB Inhale 1 puff into the lungs daily. 30 each 6   vitamin B-12 (CYANOCOBALAMIN) 100 MCG tablet Take 100 mcg by mouth daily. gummy     oxyCODONE (OXY IR/ROXICODONE) 5 MG immediate release tablet Take 1 tablet (5 mg total) by mouth 2 (two) times daily. Must last 30 days. 60 tablet 0   Facility-Administered Medications Prior to Visit  Medication Dose Route Frequency Provider Last Rate Last Admin   acetaminophen (TYLENOL) 325 MG tablet            diphenhydrAMINE (BENADRYL) 25 mg capsule            heparin lock flush 100 unit/mL  500 Units Intravenous Once Creig Hines, MD       Zoledronic Acid (ZOMETA) 4 MG/100ML IVPB

## 2023-01-31 NOTE — Assessment & Plan Note (Signed)
Discussed risk of increased intraocular pressure, cataracts in setting long-term prednisone use.  Placed referral to ophthalmology for dilated eye exam.  Hopefully as of today after she sees pulmonology she will be off of prednisone soon.

## 2023-02-02 ENCOUNTER — Inpatient Hospital Stay: Payer: Commercial Managed Care - HMO | Attending: Oncology

## 2023-02-02 DIAGNOSIS — Z5111 Encounter for antineoplastic chemotherapy: Secondary | ICD-10-CM | POA: Insufficient documentation

## 2023-02-02 DIAGNOSIS — Z79899 Other long term (current) drug therapy: Secondary | ICD-10-CM | POA: Insufficient documentation

## 2023-02-02 DIAGNOSIS — C50212 Malignant neoplasm of upper-inner quadrant of left female breast: Secondary | ICD-10-CM

## 2023-02-02 DIAGNOSIS — C50412 Malignant neoplasm of upper-outer quadrant of left female breast: Secondary | ICD-10-CM | POA: Diagnosis present

## 2023-02-02 MED ORDER — GOSERELIN ACETATE 3.6 MG ~~LOC~~ IMPL
3.6000 mg | DRUG_IMPLANT | SUBCUTANEOUS | Status: AC
  Administered 2023-02-02: 3.6 mg via SUBCUTANEOUS
  Filled 2023-02-02: qty 3.6

## 2023-02-05 ENCOUNTER — Other Ambulatory Visit: Payer: Self-pay

## 2023-02-05 ENCOUNTER — Telehealth: Payer: Self-pay | Admitting: Urology

## 2023-02-05 MED ORDER — OXYCODONE HCL 10 MG PO TABS
10.0000 mg | ORAL_TABLET | ORAL | 0 refills | Status: DC | PRN
  Filled 2023-02-05: qty 120, 20d supply, fill #0

## 2023-02-05 NOTE — Telephone Encounter (Signed)
I cannot find a status update regarding her Myrbetriq prescription or if she is on another OAB medication.  She did have microscopic hematuria on her urinalysis the last time I saw her I would like her to follow-up at sometime in the next few weeks to recheck her urine and also do a symptom recheck if she is currently on an OAB agent.

## 2023-02-06 NOTE — Telephone Encounter (Signed)
LMOM for patient to return call and schedule a follow up.

## 2023-02-12 ENCOUNTER — Ambulatory Visit: Payer: Commercial Managed Care - HMO

## 2023-02-12 ENCOUNTER — Other Ambulatory Visit: Payer: Self-pay

## 2023-02-12 MED ORDER — GENTAMICIN SULFATE 0.1 % EX OINT
1.0000 | TOPICAL_OINTMENT | Freq: Three times a day (TID) | CUTANEOUS | 12 refills | Status: DC
  Filled 2023-02-12: qty 30, 10d supply, fill #0

## 2023-02-12 NOTE — Progress Notes (Unsigned)
02/13/2023 9:58 AM   Tricia Berry 11-01-1970 [id number]  Referring provider: Allegra Grana, FNP [address] [place],  [place] [address]  Urological history: 1.  High risk hematuria -Smoker -CTU (11/2021) -no worrisome findings -cysto (11/2021) - normal urethral meatus with very slight stenosis externally   2. rUTI's -Contribute factors of age, vaginal atrophy and IBS -Documented urine cultures over the last year  December 13, 2022 mixed urogenital flora  -Trimethoprim 100 mg daily  3. OAB -Contributing factors of age, vaginal atrophy, IBS, smoking, hypertension and pelvic surgery    No chief complaint on file.   HPI: Tricia Berry is a 52 y.o. female who presents today for follow up.   At her visit on December 13, 2022, she had been experiencing 8 or more daytime voids, 1-2 nocturia and a strong urge to urinate.  She does not have issues with urinary incontinence.  She does not engage in fluid restrictions.  She does engage in toilet mapping.  She has recently been hospitalized for hypoxia secondary to a fungal pneumonia.  She is currently not taking any OAB medications, but she is taking trimethoprim 100 mg daily.  She has issues with dryness with anticholinergic OAB.   Because of her recent hospitalizations, she is not certain if her urinary symptoms have worsened or at baseline because she feels she is felt poorly for so long.  Patient denies any modifying or aggravating factors.  Patient denies any gross hematuria, dysuria or suprapubic/flank pain.  Patient denies any fevers, chills, nausea or vomiting.  UA yellow slightly cloudy, specific gravity 1.020, 1+ blood, pH 6.5, 2+protein, trace leuk, 11-30 WBC's, 11-30 RBC's and 0-10 epithelial cells.  Urine MUF.  PVR 175 mL.  She was given samples of Gemtesa 75 mg for her OAB symptoms.   She checked with her insurance company and it appeared that Myrbetriq was on the formulary.    UA ***  PVR  ***       PMH: Past Medical History:  Diagnosis Date   Anemia    Anginal pain    excertion not cardiac related per patient   Anxiety    Breast cancer 12/2021   left breast IMC   Diverticulitis    Dyspnea    Dysrhythmia    prolong QT   Family history of ovarian cancer    GERD (gastroesophageal reflux disease)    Headache    IBS (irritable bowel syndrome)    Neuromuscular disorder    neuropathy from chemo   Pneumonia    PONV (postoperative nausea and vomiting)    severe migraine and vomiting post anesthesia   Scoliosis     Surgical History: Past Surgical History:  Procedure Laterality Date   ABDOMINAL HYSTERECTOMY     still has ovaries, no gyn cancer, hysterectomy due to endometriosis. NO cervix on exam 01/10/21   BACK SURGERY     BREAST BIOPSY Left 09/15/2020   [place] bx of mass, path pending, Q marker   BREAST BIOPSY Left 09/15/2020   [place] bx of LN, hydromarker, path pending   BREAST RECONSTRUCTION WITH PLACEMENT OF TISSUE EXPANDER AND FLEX HD (ACELLULAR HYDRATED DERMIS) Bilateral 03/17/2021   Procedure: IMMEDIATE BILATERAL BREAST RECONSTRUCTION WITH PLACEMENT OF TISSUE EXPANDER AND FLEX HD (ACELLULAR HYDRATED DERMIS);  Surgeon: Peggye Form, DO;  Location: [facility];  Service: Plastics;  Laterality: Bilateral;   CAPSULECTOMY Right 10/18/2022   Procedure: Right breast capsule release with adjustment;  Surgeon:  Dillingham, Alena Bills, DO;  Location: [facility];  Service: Plastics;  Laterality: Right;   COLONOSCOPY WITH PROPOFOL N/A 11/21/2021   Procedure: COLONOSCOPY WITH PROPOFOL;  Surgeon: Toney Reil, MD;  Location: [facility];  Service: Gastroenterology;  Laterality: N/A;   FLEXIBLE BRONCHOSCOPY Bilateral 12/01/2022   Procedure: FLEXIBLE BRONCHOSCOPY;  Surgeon: Raechel Chute, MD;  Location: [facility];  Service: Pulmonary;  Laterality: Bilateral;   IR IMAGING GUIDED PORT INSERTION  10/01/2020   MODIFIED MASTECTOMY Left  03/17/2021   Procedure: LEFT MODIFIED RADICAL MASTECTOMY;  Surgeon: Harriette Bouillon, MD;  Location: [facility];  Service: General;  Laterality: Left;   PORTA CATH REMOVAL Right 03/17/2021   Procedure: PORTA CATH REMOVAL;  Surgeon: Harriette Bouillon, MD;  Location: [facility];  Service: General;  Laterality: Right;   REMOVAL OF BILATERAL TISSUE EXPANDERS WITH PLACEMENT OF BILATERAL BREAST IMPLANTS Bilateral 05/09/2021   Procedure: REMOVAL OF BILATERAL TISSUE EXPANDERS WITH PLACEMENT OF BILATERAL BREAST IMPLANTS;  Surgeon: Peggye Form, DO;  Location: [facility];  Service: Plastics;  Laterality: Bilateral;  90 min   TOTAL MASTECTOMY Right 03/17/2021   Procedure: TOTAL MASTECTOMY;  Surgeon: Harriette Bouillon, MD;  Location: [facility];  Service: General;  Laterality: Right;    Home Medications:  Allergies as of 02/13/2023       Reactions   Morphine Nausea And Vomiting, Other (See Comments)   migranes Other reaction(s): Headache Migraine, vomiting   Sertraline Hcl    REACTION: Worsened symptoms of IBS   Sulfa Antibiotics Rash   Sulfamethoxazole Rash   Sulfonamide Derivatives Rash        Medication List        Accurate as of February 12, 2023  9:58 AM. If you have any questions, ask your nurse or doctor.          acetaminophen 650 MG CR tablet Commonly known as: TYLENOL Take 1,300 mg by mouth every 8 (eight) hours as needed for pain.   albuterol (2.5 MG/3ML) 0.083% nebulizer solution Commonly known as: PROVENTIL Take 3 mLs (2.5 mg total) by nebulization every 6 (six) hours as needed for wheezing or shortness of breath.   albuterol 108 (90 Base) MCG/ACT inhaler Commonly known as: VENTOLIN HFA Inhale 2 puffs into the lungs every 6 (six) hours as needed for wheezing or shortness of breath.   anastrozole 1 MG tablet Commonly known as: ARIMIDEX Take 1 tablet (1 mg total) by mouth daily.   Anoro Ellipta 62.5-25  MCG/ACT Aepb Generic drug: umeclidinium-vilanterol Inhale 1 puff into the lungs daily.   atovaquone 750 MG/5ML suspension Commonly known as: MEPRON Take 10 mLs (1,500 mg total) by mouth daily.   b complex vitamins capsule Take 1 capsule by mouth daily.   baclofen 10 MG tablet Commonly known as: LIORESAL Take 1 tablet (10 mg total) by mouth 3 (three) times daily as needed for muscle spasms.   calcium-vitamin D 500-5 MG-MCG tablet Commonly known as: OSCAL WITH D Take 1 tablet by mouth daily with breakfast.   cholecalciferol 25 MCG (1000 UNIT) tablet Commonly known as: VITAMIN D3 Take 1,000 Units by mouth daily.   dextromethorphan-guaiFENesin 10-100 MG/5ML liquid Commonly known as: ROBITUSSIN-DM Take 10 mLs by mouth every 4 (four) hours as needed for cough.   docusate sodium 100 MG capsule Commonly known as: COLACE Take 100 mg by mouth daily as needed for mild constipation.   FLUoxetine 20 MG capsule Commonly known as: PROZAC Take 1  capsule (20 mg total) by mouth every morning.   FreeStyle Freedom Lite w/Device Kit Test in the morning, at noon, and at bedtime.   gentamicin ointment 0.1 % Commonly known as: GARAMYCIN Apply to nose 3 (three) times daily.   ibuprofen 400 MG tablet Commonly known as: ADVIL Take 400 mg by mouth every 6 (six) hours as needed.   LORazepam 0.5 MG tablet Commonly known as: ATIVAN Take 1 tablet (0.5 mg total) by mouth 2 (two) times daily as needed for anxiety.   MULTIVITAL PO Take 1 Dose by mouth daily.   Myrbetriq 50 MG Tb24 tablet Generic drug: mirabegron ER Take 1 tablet (50 mg total) by mouth daily.   naloxone 4 MG/0.1ML Liqd nasal spray kit Commonly known as: NARCAN Place 1 spray into the nose as needed for opioid-induced respiratory depresssion. In case of emergency (overdose), spray once into each nostril. If no response within 3 minutes, repeat application and call 911.   nicotine 21 mg/24hr patch Commonly known as: NICODERM  CQ - dosed in mg/24 hours Apply one patch to skin once daily (Place 1 patch (21 mg total) onto the skin daily.)   oxyCODONE 5 MG immediate release tablet Commonly known as: Oxy IR/ROXICODONE Take 1 tablet (5 mg total) by mouth 2 (two) times daily. Must last 30 days.   oxyCODONE 5 MG immediate release tablet Commonly known as: Oxy IR/ROXICODONE Take 1 tablet (5 mg total) by mouth every 4 (four) hours as needed for pain.   Oxycodone HCl 10 MG Tabs Take 1 tablet (10 mg total) by mouth every 4 (four) hours as needed for pain.   predniSONE 20 MG tablet Commonly known as: DELTASONE Take 2 tablets (40 mg total) by mouth daily until seen by pulmonary to start taper   pregabalin 150 MG capsule Commonly known as: LYRICA Take 1 capsule (150 mg total) by mouth in the morning, at noon, and at bedtime.   PROBIOTIC ACIDOPHILUS PO Take 1 capsule by mouth daily.   rosuvastatin 5 MG tablet Commonly known as: CRESTOR Take 5 mg by mouth daily.   trimethoprim 100 MG tablet Commonly known as: TRIMPEX Take 1 tablet (100 mg total) by mouth daily.   vitamin B-12 100 MCG tablet Commonly known as: CYANOCOBALAMIN Take 100 mcg by mouth daily. gummy   cyanocobalamin 1000 MCG/ML injection Commonly known as: VITAMIN B12 Inject 1 mL into the muscle once monthly (Inject 1 mL (1,000 mcg total) into the muscle every 30 (thirty) days.)        Allergies:  Allergies  Allergen Reactions   Morphine Nausea And Vomiting and Other (See Comments)    migranes Other reaction(s): Headache Migraine, vomiting   Sertraline Hcl     REACTION: Worsened symptoms of IBS   Sulfa Antibiotics Rash   Sulfamethoxazole Rash   Sulfonamide Derivatives Rash    Family History: Family History  Problem Relation Age of Onset   Hypertension Mother    Osteoarthritis Mother    Diverticulitis Mother    Heart failure Father    Hypertension Father    Gout Father    Heart attack Father 18   Diverticulitis Brother     Ovarian cancer Paternal Grandmother     Social History:  reports that she has been smoking cigarettes. She has a 20.00 pack-year smoking history. She has never used smokeless tobacco. She reports that she does not currently use alcohol. She reports that she does not use drugs.  ROS: Pertinent ROS in HPI  Physical  Exam: There were no vitals taken for this visit.  Constitutional:  Well nourished. Alert and oriented, No acute distress. HEENT: [place] AT, moist mucus membranes.  Trachea midline Cardiovascular: No clubbing, cyanosis, or edema. Respiratory: Normal respiratory effort, no increased work of breathing. Neurologic: Grossly intact, no focal deficits, moving all 4 extremities. Psychiatric: Normal mood and affect.    Laboratory Data: Lab Results  Component Value Date   CREATININE 1.18 (H) 12/29/2022   Lab Results  Component Value Date   AST 24 12/29/2022   Lab Results  Component Value Date   ALT 40 12/29/2022    Urinalysis See EPIC and HPI I have reviewed the labs.   Pertinent Imaging: ***  Assessment & Plan:    1. High risk hematuria -smoker -hematuria work up (2023) negative -no reports of gross heme  2. OAB -***  3. rUTI's -No recent infections -Continue trimethoprim 100 mg daily  No follow-ups on file.  These notes generated with voice recognition software. I apologize for typographical errors.  Cloretta Ned  [facility] [address]  [address] [place], [place] [address] [phone]

## 2023-02-13 ENCOUNTER — Ambulatory Visit: Payer: Commercial Managed Care - HMO | Admitting: Urology

## 2023-02-13 ENCOUNTER — Encounter: Payer: Self-pay | Admitting: Urology

## 2023-02-13 VITALS — BP 106/67 | HR 91 | Ht 60.0 in | Wt 137.0 lb

## 2023-02-13 DIAGNOSIS — Z87440 Personal history of urinary (tract) infections: Secondary | ICD-10-CM | POA: Diagnosis not present

## 2023-02-13 DIAGNOSIS — N3281 Overactive bladder: Secondary | ICD-10-CM

## 2023-02-13 DIAGNOSIS — N390 Urinary tract infection, site not specified: Secondary | ICD-10-CM

## 2023-02-13 DIAGNOSIS — R319 Hematuria, unspecified: Secondary | ICD-10-CM

## 2023-02-13 LAB — MICROSCOPIC EXAMINATION

## 2023-02-13 LAB — URINALYSIS, COMPLETE
Bilirubin, UA: NEGATIVE
Glucose, UA: NEGATIVE
Ketones, UA: NEGATIVE
Leukocytes,UA: NEGATIVE
Nitrite, UA: NEGATIVE
Protein,UA: NEGATIVE
Specific Gravity, UA: 1.015 (ref 1.005–1.030)
Urobilinogen, Ur: 0.2 mg/dL (ref 0.2–1.0)
pH, UA: 7 (ref 5.0–7.5)

## 2023-02-13 LAB — BLADDER SCAN AMB NON-IMAGING: Scan Result: 39

## 2023-02-19 ENCOUNTER — Inpatient Hospital Stay: Payer: 59

## 2023-02-21 ENCOUNTER — Other Ambulatory Visit: Payer: Self-pay | Admitting: Urology

## 2023-02-21 ENCOUNTER — Other Ambulatory Visit: Payer: Self-pay | Admitting: Student in an Organized Health Care Education/Training Program

## 2023-02-21 ENCOUNTER — Other Ambulatory Visit: Payer: Self-pay

## 2023-02-21 ENCOUNTER — Other Ambulatory Visit: Payer: Self-pay | Admitting: Family

## 2023-02-21 DIAGNOSIS — J189 Pneumonia, unspecified organism: Secondary | ICD-10-CM

## 2023-02-21 DIAGNOSIS — F411 Generalized anxiety disorder: Secondary | ICD-10-CM

## 2023-02-21 MED ORDER — ATOVAQUONE 750 MG/5ML PO SUSP
1500.0000 mg | Freq: Every day | ORAL | 1 refills | Status: DC
  Filled 2023-02-21: qty 300, 30d supply, fill #0
  Filled 2023-03-20 (×3): qty 300, 30d supply, fill #1

## 2023-02-21 MED ORDER — MIRABEGRON ER 50 MG PO TB24
50.0000 mg | ORAL_TABLET | Freq: Every day | ORAL | 1 refills | Status: DC
  Filled 2023-02-21: qty 30, 30d supply, fill #0
  Filled 2023-04-11: qty 30, 30d supply, fill #1

## 2023-02-21 MED ORDER — OXYCODONE HCL 10 MG PO TABS
10.0000 mg | ORAL_TABLET | ORAL | 0 refills | Status: DC | PRN
  Filled 2023-02-23: qty 120, 20d supply, fill #0

## 2023-02-21 MED ORDER — LORAZEPAM 0.5 MG PO TABS
0.5000 mg | ORAL_TABLET | Freq: Two times a day (BID) | ORAL | 2 refills | Status: DC | PRN
  Filled 2023-02-21: qty 60, 30d supply, fill #0
  Filled 2023-03-20: qty 60, 30d supply, fill #1
  Filled 2023-04-22: qty 60, 30d supply, fill #2

## 2023-02-21 MED ORDER — PREDNISONE 20 MG PO TABS
20.0000 mg | ORAL_TABLET | Freq: Every day | ORAL | 0 refills | Status: DC
  Filled 2023-02-21: qty 30, 30d supply, fill #0

## 2023-02-23 ENCOUNTER — Other Ambulatory Visit: Payer: Self-pay

## 2023-02-26 ENCOUNTER — Inpatient Hospital Stay: Payer: Commercial Managed Care - HMO

## 2023-03-02 ENCOUNTER — Inpatient Hospital Stay (HOSPITAL_BASED_OUTPATIENT_CLINIC_OR_DEPARTMENT_OTHER): Payer: Commercial Managed Care - HMO | Admitting: Oncology

## 2023-03-02 ENCOUNTER — Inpatient Hospital Stay: Payer: Commercial Managed Care - HMO | Attending: Oncology

## 2023-03-02 ENCOUNTER — Encounter: Payer: Self-pay | Admitting: Oncology

## 2023-03-02 ENCOUNTER — Inpatient Hospital Stay: Payer: Commercial Managed Care - HMO

## 2023-03-02 VITALS — BP 112/75 | HR 74 | Temp 96.3°F | Resp 18 | Ht 61.0 in | Wt 136.0 lb

## 2023-03-02 DIAGNOSIS — Z79899 Other long term (current) drug therapy: Secondary | ICD-10-CM | POA: Diagnosis not present

## 2023-03-02 DIAGNOSIS — Z79818 Long term (current) use of other agents affecting estrogen receptors and estrogen levels: Secondary | ICD-10-CM | POA: Diagnosis not present

## 2023-03-02 DIAGNOSIS — C50212 Malignant neoplasm of upper-inner quadrant of left female breast: Secondary | ICD-10-CM

## 2023-03-02 DIAGNOSIS — Z5111 Encounter for antineoplastic chemotherapy: Secondary | ICD-10-CM | POA: Insufficient documentation

## 2023-03-02 DIAGNOSIS — Z79811 Long term (current) use of aromatase inhibitors: Secondary | ICD-10-CM

## 2023-03-02 DIAGNOSIS — Z170 Estrogen receptor positive status [ER+]: Secondary | ICD-10-CM

## 2023-03-02 DIAGNOSIS — Z7983 Long term (current) use of bisphosphonates: Secondary | ICD-10-CM

## 2023-03-02 DIAGNOSIS — Z5181 Encounter for therapeutic drug level monitoring: Secondary | ICD-10-CM | POA: Diagnosis not present

## 2023-03-02 LAB — COMPREHENSIVE METABOLIC PANEL
ALT: 25 U/L (ref 0–44)
AST: 29 U/L (ref 15–41)
Albumin: 3.8 g/dL (ref 3.5–5.0)
Alkaline Phosphatase: 59 U/L (ref 38–126)
Anion gap: 13 (ref 5–15)
BUN: 10 mg/dL (ref 6–20)
CO2: 26 mmol/L (ref 22–32)
Calcium: 9.3 mg/dL (ref 8.9–10.3)
Chloride: 101 mmol/L (ref 98–111)
Creatinine, Ser: 1.04 mg/dL — ABNORMAL HIGH (ref 0.44–1.00)
GFR, Estimated: >60 mL/min (ref >60)
Glucose, Bld: 161 mg/dL — ABNORMAL HIGH (ref 70–99)
Potassium: 3.7 mmol/L (ref 3.5–5.1)
Sodium: 140 mmol/L (ref 135–145)
Total Bilirubin: 0.4 mg/dL (ref 0.3–1.2)
Total Protein: 7.4 g/dL (ref 6.5–8.1)

## 2023-03-02 LAB — CBC WITH DIFFERENTIAL/PLATELET
Abs Immature Granulocytes: 0.07 10*3/uL (ref 0.00–0.07)
Basophils Absolute: 0 10*3/uL (ref 0.0–0.1)
Basophils Relative: 0 %
Eosinophils Absolute: 0 10*3/uL (ref 0.0–0.5)
Eosinophils Relative: 0 %
HCT: 41.7 % (ref 36.0–46.0)
Hemoglobin: 13.3 g/dL (ref 12.0–15.0)
Immature Granulocytes: 1 %
Lymphocytes Relative: 10 %
Lymphs Abs: 1.2 10*3/uL (ref 0.7–4.0)
MCH: 30.2 pg (ref 26.0–34.0)
MCHC: 31.9 g/dL (ref 30.0–36.0)
MCV: 94.6 fL (ref 80.0–100.0)
Monocytes Absolute: 0.4 10*3/uL (ref 0.1–1.0)
Monocytes Relative: 3 %
Neutro Abs: 10.1 10*3/uL — ABNORMAL HIGH (ref 1.7–7.7)
Neutrophils Relative %: 86 %
Platelets: 194 10*3/uL (ref 150–400)
RBC: 4.41 MIL/uL (ref 3.87–5.11)
RDW: 14.5 % (ref 11.5–15.5)
WBC: 11.8 10*3/uL — ABNORMAL HIGH (ref 4.0–10.5)
nRBC: 0 % (ref 0.0–0.2)

## 2023-03-02 MED ORDER — SODIUM CHLORIDE 0.9 % IV SOLN
INTRAVENOUS | Status: DC
  Filled 2023-03-02: qty 250

## 2023-03-02 MED ORDER — GOSERELIN ACETATE 3.6 MG ~~LOC~~ IMPL
3.6000 mg | DRUG_IMPLANT | SUBCUTANEOUS | Status: DC
  Administered 2023-03-02: 3.6 mg via SUBCUTANEOUS
  Filled 2023-03-02: qty 3.6

## 2023-03-02 MED ORDER — ZOLEDRONIC ACID 4 MG/100ML IV SOLN
4.0000 mg | INTRAVENOUS | Status: DC
  Administered 2023-03-02: 4 mg via INTRAVENOUS
  Filled 2023-03-02: qty 100

## 2023-03-04 ENCOUNTER — Encounter: Payer: Self-pay | Admitting: Oncology

## 2023-03-04 NOTE — Progress Notes (Signed)
Hematology/Oncology Consult note [facility]  Telephone:([phone] [phone]  Patient Care Team: Allegra Grana, FNP as PCP - General (Family Medicine) Debbe Odea, MD as PCP - Cardiology (Cardiology) Jim Like, RN as Oncology Nurse Navigator Creig Hines, MD as Consulting Physician (Hematology and Oncology) Keitha Butte, RN as Registered Nurse (Oncology) Borders, Daryl Eastern, NP as Nurse Practitioner [facility])   Name of the patient: Tricia Berry  [id number]  02/18/1971   Date of visit: 03/04/23  Diagnosis- invasive mammary carcinoma of the left breast stage III ypT2 ypN2 cM0 ER/PR positive and HER2 positive   Chief complaint/ Reason for visit- routine f/u of breast cancer  Heme/Onc history: patient is a 52 year old female who noticed a left breast lump for about a year but did not seek medical attention as she was out of medical insurance and did not have a PCP.  She then noticed that her lump is gradually getting larger with an area of ulceration on the skin and underwent a diagnostic bilateral mammogram on 09/08/2020 which showed hypoechoic interconnected masses in the left breast from 10:00 to 2 o'clock position spanning at least an area of 4.8 cm.  Ultrasound also showed 5 abnormal lymph nodes in the axilla with cortical thickening.  Both the mass and the lymph nodes were biopsied and was consistent with invasive mammary carcinoma grade 2.  Tumor was ER +91- 100%, PR +11 to 20% and HER-2 negative.  Ki-67 15%.    PET CT scan showed hypermetabolism in the area of the left breast but no evidence of hypermetabolism in the left axilla Or evidence of distant metastatic disease.   Neoadjuvant dose dense ACT chemotherapy started on 10/08/2020. Interim ultrasound after 4 cycles of dose dense AC chemotherapy showed overall decrease in volume of the tumor.  Nodularity previously seen on ultrasound was also  decreased in size.   Patient completed neoadjuvant AC Taxol chemotherapy and underwent bilateral mastectomy with reconstruction.Final pathology showed fibrocystic changes in the right breast with no malignancy.  3.6 cm invasive mammary carcinoma in the left breast.  7 out of 16 lymph nodes positive for malignancy.  Treatment effect in the breast present but not robust.  Treatment effect in the lymph nodes minimal.  Margins negative.  Overall grade 2.  Extranodal extension present.  ER greater than 90% positive, PR 15% positive.  HER2 +2 equivocal and positive by FISH on the primary breast specimen.  HER2 testing was however negative in the lymph nodes.  Patient completed 1 year of adjuvant Herceptin and Perjeta in August 2023.  Patient could not tolerate adjuvant Verzenio and stopped taking it  Interval history-patient is admitted to the hospital for pneumonitis in February 2024.  She is following up with pulmonary for this and is currently on a steroid taper.  She is on home oxygen now.  Patient has chronic back pain and gets as needed oxycodone through pain clinic  ECOG PS- 1 Pain scale- 4  Review of systems- Review of Systems  Constitutional:  Positive for malaise/fatigue. Negative for chills, fever and weight loss.  HENT:  Negative for congestion, ear discharge and nosebleeds.   Eyes:  Negative for blurred vision.  Respiratory:  Negative for cough, hemoptysis, sputum production, shortness of breath and wheezing.   Cardiovascular:  Negative for chest pain, palpitations, orthopnea and claudication.  Gastrointestinal:  Negative for abdominal pain, blood in stool, constipation, diarrhea, heartburn, melena, nausea and vomiting.  Genitourinary:  Negative for dysuria, flank pain, frequency, hematuria and urgency.  Musculoskeletal:  Positive for back pain. Negative for joint pain and myalgias.  Skin:  Negative for rash.  Neurological:  Positive for sensory change (Peripheral neuropathy). Negative for  dizziness, tingling, focal weakness, seizures, weakness and headaches.  Endo/Heme/Allergies:  Does not bruise/bleed easily.  Psychiatric/Behavioral:  Negative for depression and suicidal ideas. The patient does not have insomnia.       Allergies  Allergen Reactions   Morphine Nausea And Vomiting and Other (See Comments)    migranes Other reaction(s): Headache Migraine, vomiting   Sertraline Hcl     REACTION: Worsened symptoms of IBS   Sulfa Antibiotics Rash   Sulfamethoxazole Rash   Sulfonamide Derivatives Rash     Past Medical History:  Diagnosis Date   Anemia    Anginal pain (HCC)    excertion not cardiac related per patient   Anxiety    Breast cancer (HCC) 12/2021   left breast IMC   Diverticulitis    Dyspnea    Dysrhythmia    prolong QT   Family history of ovarian cancer    GERD (gastroesophageal reflux disease)    Headache    IBS (irritable bowel syndrome)    Neuromuscular disorder (HCC)    neuropathy from chemo   Pneumonia    PONV (postoperative nausea and vomiting)    severe migraine and vomiting post anesthesia   Scoliosis      Past Surgical History:  Procedure Laterality Date   ABDOMINAL HYSTERECTOMY     still has ovaries, no gyn cancer, hysterectomy due to endometriosis. NO cervix on exam 01/10/21   BACK SURGERY     BREAST BIOPSY Left 09/15/2020   [place] bx of mass, path pending, Q marker   BREAST BIOPSY Left 09/15/2020   [place] bx of LN, hydromarker, path pending   BREAST RECONSTRUCTION WITH PLACEMENT OF TISSUE EXPANDER AND FLEX HD (ACELLULAR HYDRATED DERMIS) Bilateral 03/17/2021   Procedure: IMMEDIATE BILATERAL BREAST RECONSTRUCTION WITH PLACEMENT OF TISSUE EXPANDER AND FLEX HD (ACELLULAR HYDRATED DERMIS);  Surgeon: Peggye Form, DO;  Location: [facility];  Service: Plastics;  Laterality: Bilateral;   CAPSULECTOMY Right 10/18/2022   Procedure: Right breast capsule release with adjustment;  Surgeon: Peggye Form, DO;  Location:  [facility];  Service: Plastics;  Laterality: Right;   COLONOSCOPY WITH PROPOFOL N/A 11/21/2021   Procedure: COLONOSCOPY WITH PROPOFOL;  Surgeon: Toney Reil, MD;  Location: [facility];  Service: Gastroenterology;  Laterality: N/A;   FLEXIBLE BRONCHOSCOPY Bilateral 12/01/2022   Procedure: FLEXIBLE BRONCHOSCOPY;  Surgeon: Raechel Chute, MD;  Location: [facility];  Service: Pulmonary;  Laterality: Bilateral;   IR IMAGING GUIDED PORT INSERTION  10/01/2020   MODIFIED MASTECTOMY Left 03/17/2021   Procedure: LEFT MODIFIED RADICAL MASTECTOMY;  Surgeon: Harriette Bouillon, MD;  Location: [facility];  Service: General;  Laterality: Left;   PORTA CATH REMOVAL Right 03/17/2021   Procedure: PORTA CATH REMOVAL;  Surgeon: Harriette Bouillon, MD;  Location: [facility];  Service: General;  Laterality: Right;   REMOVAL OF BILATERAL TISSUE EXPANDERS WITH PLACEMENT OF BILATERAL BREAST IMPLANTS Bilateral 05/09/2021   Procedure: REMOVAL OF BILATERAL TISSUE EXPANDERS WITH PLACEMENT OF BILATERAL BREAST IMPLANTS;  Surgeon: Peggye Form, DO;  Location: [facility];  Service: Plastics;  Laterality: Bilateral;  90 min   TOTAL MASTECTOMY Right 03/17/2021   Procedure: TOTAL MASTECTOMY;  Surgeon: Harriette Bouillon, MD;  Location: [facility];  Service: General;  Laterality: Right;    Social History   Socioeconomic History   Marital status: Significant Other    Spouse name: Not on file   Number of children: 2   Years of education: Not on file   Highest education level: Not on file  Occupational History   Occupation: Nurse    Employer: [employer]  Tobacco Use   Smoking status: Every Day    Packs/day: 1.00    Years: 20.00    Additional pack years: 0.00    Total pack years: 20.00    Types: Cigarettes   Smokeless tobacco: Never   Tobacco comments:    0.5PPD 12/27/2022  Vaping Use   Vaping Use: Never used  Substance and Sexual  Activity   Alcohol use: Not Currently   Drug use: No   Sexual activity: Not Currently    Birth control/protection: Surgical    Comment: hyst  Other Topics Concern   Not on file  Social History Narrative   Patient works as an Insurance underwriter at [employer]. She has 2 children at home who have special needs. She and her spouse are primary caregivers.    Social Determinants of Health   Financial Resource Strain: Not on file  Food Insecurity: No Food Insecurity (11/21/2022)   Hunger Vital Sign    Worried About Running Out of Food in the Last Year: Never true    Ran Out of Food in the Last Year: Never true  Transportation Needs: No Transportation Needs (11/21/2022)   PRAPARE - Administrator, Civil Service (Medical): No    Lack of Transportation (Non-Medical): No  Physical Activity: Not on file  Stress: Not on file  Social Connections: Not on file  Intimate Partner Violence: Not At Risk (11/21/2022)   Humiliation, Afraid, Rape, and Kick questionnaire    Fear of Current or Ex-Partner: No    Emotionally Abused: No    Physically Abused: No    Sexually Abused: No    Family History  Problem Relation Age of Onset   Hypertension Mother    Osteoarthritis Mother    Diverticulitis Mother    Heart failure Father    Hypertension Father    Gout Father    Heart attack Father 26   Diverticulitis Brother    Ovarian cancer Paternal Grandmother      Current Outpatient Medications:    acetaminophen (TYLENOL) 650 MG CR tablet, Take 1,300 mg by mouth every 8 (eight) hours as needed for pain., Disp: , Rfl:    albuterol (PROVENTIL) (2.5 MG/3ML) 0.083% nebulizer solution, Take 3 mLs (2.5 mg total) by nebulization every 6 (six) hours as needed for wheezing or shortness of breath., Disp: 360 mL, Rfl: 12   albuterol (VENTOLIN HFA) 108 (90 Base) MCG/ACT inhaler, Inhale 2 puffs into the lungs every 6 (six) hours as needed for wheezing or shortness of breath., Disp: 6.7 g, Rfl: 0   anastrozole (ARIMIDEX)  1 MG tablet, Take 1 tablet (1 mg total) by mouth daily., Disp: 30 tablet, Rfl: 5   atovaquone (MEPRON) 750 MG/5ML suspension, Take 10 mLs (1,500 mg total) by mouth daily., Disp: 300 mL, Rfl: 1   b complex vitamins capsule, Take 1 capsule by mouth daily., Disp: , Rfl:    baclofen (LIORESAL) 10 MG tablet, Take 1 tablet (10 mg total) by mouth 3 (three) times daily as needed for muscle spasms., Disp: 60 tablet, Rfl: 2   Blood Glucose Monitoring Suppl (BLOOD GLUCOSE MONITOR SYSTEM) w/Device  KIT, Test in the morning, at noon, and at bedtime., Disp: 1 kit, Rfl: 0   calcium-vitamin D (OSCAL WITH D) 500-5 MG-MCG tablet, Take 1 tablet by mouth daily with breakfast., Disp: , Rfl:    cholecalciferol (VITAMIN D3) 25 MCG (1000 UNIT) tablet, Take 1,000 Units by mouth daily., Disp: , Rfl:    cyanocobalamin (VITAMIN B12) 1000 MCG/ML injection, Inject 1 mL (1,000 mcg total) into the muscle every 30 (thirty) days., Disp: 3 mL, Rfl: 4   dextromethorphan-guaiFENesin (ROBITUSSIN-DM) 10-100 MG/5ML liquid, Take 10 mLs by mouth every 4 (four) hours as needed for cough., Disp: , Rfl:    docusate sodium (COLACE) 100 MG capsule, Take 100 mg by mouth daily as needed for mild constipation., Disp: , Rfl:    FLUoxetine (PROZAC) 20 MG capsule, Take 1 capsule (20 mg total) by mouth every morning., Disp: 90 capsule, Rfl: 3   gentamicin ointment (GARAMYCIN) 0.1 %, Apply to nose 3 (three) times daily., Disp: 30 g, Rfl: 12   gentamicin ointment (GARAMYCIN) 0.1 %, Apply 1 Application topically to nose 3 (three) times daily., Disp: 30 g, Rfl: 12   ibuprofen (ADVIL) 400 MG tablet, Take 400 mg by mouth every 6 (six) hours as needed., Disp: , Rfl:    Lactobacillus (PROBIOTIC ACIDOPHILUS PO), Take 1 capsule by mouth daily., Disp: , Rfl:    LORazepam (ATIVAN) 0.5 MG tablet, Take 1 tablet (0.5 mg total) by mouth 2 (two) times daily as needed for anxiety., Disp: 60 tablet, Rfl: 2   mirabegron ER (MYRBETRIQ) 50 MG TB24 tablet, Take 1 tablet (50 mg  total) by mouth daily., Disp: 30 tablet, Rfl: 1   Multiple Vitamins-Minerals (MULTIVITAL PO), Take 1 Dose by mouth daily., Disp: , Rfl:    naloxone (NARCAN) nasal spray 4 mg/0.1 mL, Place 1 spray into the nose as needed for opioid-induced respiratory depresssion. In case of emergency (overdose), spray once into each nostril. If no response within 3 minutes, repeat application and call 911., Disp: 2 each, Rfl: 0   nicotine (NICODERM CQ - DOSED IN MG/24 HOURS) 21 mg/24hr patch, Place 1 patch (21 mg total) onto the skin daily., Disp: 28 patch, Rfl: 1   oxyCODONE (OXY IR/ROXICODONE) 5 MG immediate release tablet, Take 1 tablet (5 mg total) by mouth every 4 (four) hours as needed for pain., Disp: 120 tablet, Rfl: 0   Oxycodone HCl 10 MG TABS, Take 1 tablet (10 mg total) by mouth every 4 (four) hours as needed for pain., Disp: 120 tablet, Rfl: 0   Oxycodone HCl 10 MG TABS, Take 1 tablet (10 mg total) by mouth every 4 (four) hours as needed for pain., Disp: 120 tablet, Rfl: 0   predniSONE (DELTASONE) 20 MG tablet, Take 1 tablet (20 mg total) by mouth daily., Disp: 30 tablet, Rfl: 0   pregabalin (LYRICA) 150 MG capsule, Take 1 capsule (150 mg total) by mouth in the morning, at noon, and at bedtime., Disp: 90 capsule, Rfl: 2   rosuvastatin (CRESTOR) 5 MG tablet, Take 5 mg by mouth daily., Disp: , Rfl:    trimethoprim (TRIMPEX) 100 MG tablet, Take 1 tablet (100 mg total) by mouth daily., Disp: 90 tablet, Rfl: 3   umeclidinium-vilanterol (ANORO ELLIPTA) 62.5-25 MCG/ACT AEPB, Inhale 1 puff into the lungs daily., Disp: 30 each, Rfl: 6   vitamin B-12 (CYANOCOBALAMIN) 100 MCG tablet, Take 100 mcg by mouth daily. gummy, Disp: , Rfl:    oxyCODONE (OXY IR/ROXICODONE) 5 MG immediate release tablet, Take 1 tablet (  5 mg total) by mouth 2 (two) times daily. Must last 30 days., Disp: 60 tablet, Rfl: 0 No current facility-administered medications for this visit.  Facility-Administered Medications Ordered in Other Visits:     acetaminophen (TYLENOL) 325 MG tablet, , , ,    diphenhydrAMINE (BENADRYL) 25 mg capsule, , , ,    goserelin (ZOLADEX) injection 3.6 mg, 3.6 mg, Subcutaneous, Q28 days, Creig Hines, MD, 3.6 mg at 02/02/23 1206   heparin lock flush 100 unit/mL, 500 Units, Intravenous, Once, Creig Hines, MD   Zoledronic Acid (ZOMETA) 4 MG/100ML IVPB, , , ,   Physical exam:  Vitals:   03/02/23 1455  BP: 112/75  Pulse: 74  Resp: 18  Temp: (!) 96.3 F (35.7 C)  TempSrc: Tympanic  SpO2: 94%  Weight: 136 lb (61.7 kg)  Height: 5\' 1"  (1.549 m)   Physical Exam Cardiovascular:     Rate and Rhythm: Normal rate and regular rhythm.     Heart sounds: Normal heart sounds.  Pulmonary:     Breath sounds: Normal breath sounds.     Comments: Patient on home oxygen Abdominal:     General: Bowel sounds are normal.     Palpations: Abdomen is soft.  Skin:    General: Skin is warm and dry.  Neurological:     Mental Status: She is alert and oriented to person, place, and time.   Chest wall exam: Patient is s/p bilateral mastectomy with reconstruction.  No evidence of chest wall recurrence.  No palpable bilateral axillary adenopathy.     Latest Ref Rng & Units 03/02/2023    2:45 PM  CMP  Glucose 70 - 99 mg/dL 161   BUN 6 - 20 mg/dL 10   Creatinine 0.96 - 1.00 mg/dL 0.45   Sodium 409 - 811 mmol/L 140   Potassium 3.5 - 5.1 mmol/L 3.7   Chloride 98 - 111 mmol/L 101   CO2 22 - 32 mmol/L 26   Calcium 8.9 - 10.3 mg/dL 9.3   Total Protein 6.5 - 8.1 g/dL 7.4   Total Bilirubin 0.3 - 1.2 mg/dL 0.4   Alkaline Phos 38 - 126 U/L 59   AST 15 - 41 U/L 29   ALT 0 - 44 U/L 25       Latest Ref Rng & Units 03/02/2023    2:45 PM  CBC  WBC 4.0 - 10.5 K/uL 11.8   Hemoglobin 12.0 - 15.0 g/dL 91.4   Hematocrit 78.2 - 46.0 % 41.7   Platelets 150 - 400 K/uL 194     Assessment and plan- Patient is a 52 y.o. female  with history of stage III left breast cancer ER/PR positive and mixed pathology of HER2 positive and  HER2 negative.  She is here for following issues:  Breast cancer: Oh she is on monthly Zoladex which she will continue.  I will plan to hold Zoladex after 6 to 8 months and see if she is menopausal at that time and if it can be stopped.  Continue Arimidex.  She has stopped adjuvant Verzenio due to poor tolerance.  Adjuvant Zometa: Will receive her next dose today and I will see her back in 3 months for Zometa as well which would be her last dosing.  Chemo-induced peripheral neuropathy: Continue Lyrica.  She is also on as needed oxycodone for her chronic back pain which she gets through pain clinic. Pneumonitis: Etiology unclear.  She is currently on a steroid taper and follows up with  pulmonary.     Visit Diagnosis 1. Malignant neoplasm of upper-inner quadrant of left female breast, unspecified estrogen receptor status (HCC)   2. Use of leuprolide acetate (Lupron)   3. Visit for monitoring Arimidex therapy   4. Encounter for monitoring zoledronic acid therapy      Dr. Owens Shark, MD, MPH [facility] [phone] 03/04/2023 6:02 PM

## 2023-03-05 ENCOUNTER — Other Ambulatory Visit: Payer: Self-pay

## 2023-03-05 MED ORDER — OXYCODONE HCL 10 MG PO TABS
10.0000 mg | ORAL_TABLET | ORAL | 0 refills | Status: DC | PRN
  Filled 2023-03-05 – 2023-03-11 (×2): qty 180, 30d supply, fill #0

## 2023-03-05 MED ORDER — OXYCODONE HCL 10 MG PO TABS
10.0000 mg | ORAL_TABLET | ORAL | 0 refills | Status: DC | PRN
  Filled 2023-03-11 – 2023-03-13 (×2): qty 180, 30d supply, fill #0

## 2023-03-08 ENCOUNTER — Ambulatory Visit: Payer: Commercial Managed Care - HMO | Admitting: Surgical

## 2023-03-08 DIAGNOSIS — Z853 Personal history of malignant neoplasm of breast: Secondary | ICD-10-CM | POA: Diagnosis not present

## 2023-03-08 DIAGNOSIS — N651 Disproportion of reconstructed breast: Secondary | ICD-10-CM

## 2023-03-08 DIAGNOSIS — C50412 Malignant neoplasm of upper-outer quadrant of left female breast: Secondary | ICD-10-CM

## 2023-03-08 DIAGNOSIS — Z9013 Acquired absence of bilateral breasts and nipples: Secondary | ICD-10-CM

## 2023-03-08 NOTE — Progress Notes (Signed)
NIPPLE AREOLAR TATTOO PROCEDURE  PREOPERATIVE DIAGNOSIS:  Acquired absence of bilateral nipple areolar   POSTOPERATIVE DIAGNOSIS: Acquired absence of bilateral nipple areolar    PROCEDURES: bilateral nipple areolar tattoo   ANESTHESIA: none  COMPLICATIONS: None.  JUSTIFICATION FOR PROCEDURE:  Tricia Berry is a 52 y.o. female with a history of breast cancer status post breast reconstruction. The patient presents for nipple areolar complex tattoo. Risks, benefits, indications, and alternatives of the above described procedures were discussed with the patient and all the patient's questions were answered. Consent was signed.  DESCRIPTION OF PROCEDURE: After informed consent was obtained and proper identification of patient and surgical site was made, the patient was taken to the procedure room and resting in procedure chair. The patient was prepped and draped in the usual sterile fashion. Attention was turned to the selection of flesh colored permanent tattoo ink to produce the appropriate color for nipple areolar complex tattooing. Color, size and location was confirmed with the patient. Pre-op photos were obtained and placed in patient chart with patient consent. Using a Digital Revo 7R pronged tattoo head and 1P pronged tattoo head, pigment was instilled to the designed nipple areolar complex, which was confirmed preoperatively with the patient. Once adequate pigment had been applied to the nipple areolar complex, vaseline followed by an occlusive dressing was applied. The patient tolerated the procedure well. There were no complications  The total area tattooed was: 18 cm2  Cool honey and fair honey was used to create the desired nipple color. 6 parts cool honey 2 parts fair honey  Cool honey and dark honey was used to create the desired shading and areolar texturing 2 parts cool honey, 1 part dark honey  Portrait white was used to create white shading  All of the inks expired  2026

## 2023-03-11 ENCOUNTER — Other Ambulatory Visit: Payer: Self-pay

## 2023-03-12 ENCOUNTER — Other Ambulatory Visit: Payer: Self-pay

## 2023-03-13 ENCOUNTER — Other Ambulatory Visit: Payer: Self-pay

## 2023-03-20 ENCOUNTER — Other Ambulatory Visit: Payer: Self-pay | Admitting: Family

## 2023-03-20 ENCOUNTER — Encounter: Payer: Self-pay | Admitting: Student in an Organized Health Care Education/Training Program

## 2023-03-20 ENCOUNTER — Other Ambulatory Visit: Payer: Self-pay

## 2023-03-20 ENCOUNTER — Other Ambulatory Visit: Payer: Self-pay | Admitting: Oncology

## 2023-03-20 ENCOUNTER — Other Ambulatory Visit: Payer: Self-pay | Admitting: Student in an Organized Health Care Education/Training Program

## 2023-03-20 DIAGNOSIS — J189 Pneumonia, unspecified organism: Secondary | ICD-10-CM

## 2023-03-20 MED ORDER — ANASTROZOLE 1 MG PO TABS
1.0000 mg | ORAL_TABLET | Freq: Every day | ORAL | 5 refills | Status: DC
  Filled 2023-03-20: qty 30, 30d supply, fill #0
  Filled 2023-04-22: qty 30, 30d supply, fill #1
  Filled 2023-05-21: qty 30, 30d supply, fill #2
  Filled 2023-06-22: qty 30, 30d supply, fill #3
  Filled 2023-07-23: qty 30, 30d supply, fill #4
  Filled 2023-08-20: qty 30, 30d supply, fill #5

## 2023-03-20 MED ORDER — ROSUVASTATIN CALCIUM 5 MG PO TABS
5.0000 mg | ORAL_TABLET | Freq: Every day | ORAL | 0 refills | Status: DC
  Filled 2023-03-20: qty 90, 90d supply, fill #0

## 2023-03-20 MED ORDER — PREDNISONE 20 MG PO TABS
20.0000 mg | ORAL_TABLET | Freq: Every day | ORAL | 0 refills | Status: AC
  Filled 2023-03-20: qty 30, 30d supply, fill #0

## 2023-03-21 ENCOUNTER — Other Ambulatory Visit: Payer: Self-pay | Admitting: Student in an Organized Health Care Education/Training Program

## 2023-03-21 ENCOUNTER — Telehealth: Payer: Self-pay | Admitting: Student in an Organized Health Care Education/Training Program

## 2023-03-21 ENCOUNTER — Other Ambulatory Visit: Payer: Self-pay

## 2023-03-21 ENCOUNTER — Encounter: Payer: Self-pay | Admitting: Internal Medicine

## 2023-03-21 ENCOUNTER — Inpatient Hospital Stay: Payer: Commercial Managed Care - HMO

## 2023-03-21 ENCOUNTER — Emergency Department: Payer: Commercial Managed Care - HMO

## 2023-03-21 ENCOUNTER — Inpatient Hospital Stay
Admission: EM | Admit: 2023-03-21 | Discharge: 2023-03-23 | DRG: 193 | Disposition: A | Discharge status: Home / Self Care | Payer: Commercial Managed Care - HMO | Attending: Internal Medicine | Admitting: Internal Medicine

## 2023-03-21 DIAGNOSIS — Z1152 Encounter for screening for COVID-19: Secondary | ICD-10-CM

## 2023-03-21 DIAGNOSIS — Z8249 Family history of ischemic heart disease and other diseases of the circulatory system: Secondary | ICD-10-CM | POA: Diagnosis not present

## 2023-03-21 DIAGNOSIS — J849 Interstitial pulmonary disease, unspecified: Secondary | ICD-10-CM

## 2023-03-21 DIAGNOSIS — G8929 Other chronic pain: Secondary | ICD-10-CM | POA: Diagnosis present

## 2023-03-21 DIAGNOSIS — Z888 Allergy status to other drugs, medicaments and biological substances status: Secondary | ICD-10-CM | POA: Diagnosis not present

## 2023-03-21 DIAGNOSIS — Z9981 Dependence on supplemental oxygen: Secondary | ICD-10-CM | POA: Diagnosis not present

## 2023-03-21 DIAGNOSIS — F17210 Nicotine dependence, cigarettes, uncomplicated: Secondary | ICD-10-CM | POA: Diagnosis present

## 2023-03-21 DIAGNOSIS — Z79899 Other long term (current) drug therapy: Secondary | ICD-10-CM | POA: Diagnosis not present

## 2023-03-21 DIAGNOSIS — Z882 Allergy status to sulfonamides status: Secondary | ICD-10-CM

## 2023-03-21 DIAGNOSIS — J9621 Acute and chronic respiratory failure with hypoxia: Secondary | ICD-10-CM | POA: Diagnosis present

## 2023-03-21 DIAGNOSIS — J984 Other disorders of lung: Secondary | ICD-10-CM

## 2023-03-21 DIAGNOSIS — R0602 Shortness of breath: Secondary | ICD-10-CM | POA: Diagnosis not present

## 2023-03-21 DIAGNOSIS — M545 Low back pain, unspecified: Secondary | ICD-10-CM | POA: Diagnosis present

## 2023-03-21 DIAGNOSIS — Z853 Personal history of malignant neoplasm of breast: Secondary | ICD-10-CM

## 2023-03-21 DIAGNOSIS — Z885 Allergy status to narcotic agent status: Secondary | ICD-10-CM | POA: Diagnosis not present

## 2023-03-21 DIAGNOSIS — Z9013 Acquired absence of bilateral breasts and nipples: Secondary | ICD-10-CM

## 2023-03-21 DIAGNOSIS — Z8041 Family history of malignant neoplasm of ovary: Secondary | ICD-10-CM | POA: Diagnosis not present

## 2023-03-21 DIAGNOSIS — J703 Chronic drug-induced interstitial lung disorders: Secondary | ICD-10-CM | POA: Diagnosis present

## 2023-03-21 DIAGNOSIS — N182 Chronic kidney disease, stage 2 (mild): Secondary | ICD-10-CM | POA: Insufficient documentation

## 2023-03-21 DIAGNOSIS — J189 Pneumonia, unspecified organism: Secondary | ICD-10-CM | POA: Diagnosis present

## 2023-03-21 DIAGNOSIS — Z79811 Long term (current) use of aromatase inhibitors: Secondary | ICD-10-CM | POA: Diagnosis not present

## 2023-03-21 DIAGNOSIS — T451X5A Adverse effect of antineoplastic and immunosuppressive drugs, initial encounter: Secondary | ICD-10-CM | POA: Diagnosis present

## 2023-03-21 DIAGNOSIS — Z923 Personal history of irradiation: Secondary | ICD-10-CM

## 2023-03-21 DIAGNOSIS — J122 Parainfluenza virus pneumonia: Principal | ICD-10-CM | POA: Insufficient documentation

## 2023-03-21 DIAGNOSIS — K219 Gastro-esophageal reflux disease without esophagitis: Secondary | ICD-10-CM | POA: Diagnosis present

## 2023-03-21 DIAGNOSIS — Z9221 Personal history of antineoplastic chemotherapy: Secondary | ICD-10-CM

## 2023-03-21 DIAGNOSIS — C50912 Malignant neoplasm of unspecified site of left female breast: Secondary | ICD-10-CM | POA: Diagnosis present

## 2023-03-21 DIAGNOSIS — J470 Bronchiectasis with acute lower respiratory infection: Secondary | ICD-10-CM | POA: Diagnosis present

## 2023-03-21 DIAGNOSIS — R0902 Hypoxemia: Principal | ICD-10-CM

## 2023-03-21 DIAGNOSIS — C50112 Malignant neoplasm of central portion of left female breast: Secondary | ICD-10-CM | POA: Diagnosis not present

## 2023-03-21 LAB — COMPREHENSIVE METABOLIC PANEL
ALT: 22 U/L (ref 0–44)
AST: 25 U/L (ref 15–41)
Albumin: 3.4 g/dL — ABNORMAL LOW (ref 3.5–5.0)
Alkaline Phosphatase: 69 U/L (ref 38–126)
Anion gap: 11 (ref 5–15)
BUN: 9 mg/dL (ref 6–20)
CO2: 28 mmol/L (ref 22–32)
Calcium: 9 mg/dL (ref 8.9–10.3)
Chloride: 100 mmol/L (ref 98–111)
Creatinine, Ser: 1.15 mg/dL — ABNORMAL HIGH (ref 0.44–1.00)
GFR, Estimated: 57 mL/min — ABNORMAL LOW (ref >60)
Glucose, Bld: 147 mg/dL — ABNORMAL HIGH (ref 70–99)
Potassium: 4.1 mmol/L (ref 3.5–5.1)
Sodium: 139 mmol/L (ref 135–145)
Total Bilirubin: 0.4 mg/dL (ref 0.3–1.2)
Total Protein: 7.1 g/dL (ref 6.5–8.1)

## 2023-03-21 LAB — BRAIN NATRIURETIC PEPTIDE: B Natriuretic Peptide: 57.9 pg/mL (ref 0.0–100.0)

## 2023-03-21 LAB — CBC WITH DIFFERENTIAL/PLATELET
Abs Immature Granulocytes: 0.03 10*3/uL (ref 0.00–0.07)
Basophils Absolute: 0 10*3/uL (ref 0.0–0.1)
Basophils Relative: 0 %
Eosinophils Absolute: 0 10*3/uL (ref 0.0–0.5)
Eosinophils Relative: 0 %
HCT: 39.2 % (ref 36.0–46.0)
Hemoglobin: 12.4 g/dL (ref 12.0–15.0)
Immature Granulocytes: 1 %
Lymphocytes Relative: 14 %
Lymphs Abs: 0.9 10*3/uL (ref 0.7–4.0)
MCH: 29.2 pg (ref 26.0–34.0)
MCHC: 31.6 g/dL (ref 30.0–36.0)
MCV: 92.5 fL (ref 80.0–100.0)
Monocytes Absolute: 0.4 10*3/uL (ref 0.1–1.0)
Monocytes Relative: 6 %
Neutro Abs: 5.1 10*3/uL (ref 1.7–7.7)
Neutrophils Relative %: 79 %
Platelets: 179 10*3/uL (ref 150–400)
RBC: 4.24 MIL/uL (ref 3.87–5.11)
RDW: 14.5 % (ref 11.5–15.5)
WBC: 6.4 10*3/uL (ref 4.0–10.5)
nRBC: 0 % (ref 0.0–0.2)

## 2023-03-21 MED ORDER — ALBUTEROL SULFATE (2.5 MG/3ML) 0.083% IN NEBU
2.5000 mg | INHALATION_SOLUTION | RESPIRATORY_TRACT | Status: DC | PRN
  Administered 2023-03-22: 2.5 mg via RESPIRATORY_TRACT
  Filled 2023-03-21: qty 3

## 2023-03-21 MED ORDER — ROSUVASTATIN CALCIUM 10 MG PO TABS
5.0000 mg | ORAL_TABLET | Freq: Every day | ORAL | Status: DC
  Administered 2023-03-22: 5 mg via ORAL
  Filled 2023-03-21 (×2): qty 1

## 2023-03-21 MED ORDER — PREDNISONE 20 MG PO TABS: 40.0000 mg | ORAL_TABLET | Freq: Every day | ORAL | Status: DC

## 2023-03-21 MED ORDER — IOHEXOL 350 MG/ML SOLN
75.0000 mL | Freq: Once | INTRAVENOUS | Status: AC | PRN
  Administered 2023-03-21: 75 mL via INTRAVENOUS

## 2023-03-21 MED ORDER — IPRATROPIUM-ALBUTEROL 0.5-2.5 (3) MG/3ML IN SOLN
6.0000 mL | Freq: Once | RESPIRATORY_TRACT | Status: AC
  Administered 2023-03-21: 6 mL via RESPIRATORY_TRACT
  Filled 2023-03-21: qty 3

## 2023-03-21 MED ORDER — BACLOFEN 10 MG PO TABS: 10.0000 mg | ORAL_TABLET | Freq: Three times a day (TID) | ORAL | Status: DC | PRN

## 2023-03-21 MED ORDER — SODIUM CHLORIDE 0.9 % IV BOLUS
1000.0000 mL | Freq: Once | INTRAVENOUS | Status: AC
  Administered 2023-03-21: 1000 mL via INTRAVENOUS

## 2023-03-21 MED ORDER — GUAIFENESIN 100 MG/5ML PO LIQD
200.0000 mg | Freq: Once | ORAL | Status: AC
  Administered 2023-03-21: 200 mg via ORAL
  Filled 2023-03-21: qty 10

## 2023-03-21 MED ORDER — ENOXAPARIN SODIUM 40 MG/0.4ML IJ SOSY
40.0000 mg | PREFILLED_SYRINGE | INTRAMUSCULAR | Status: DC
  Filled 2023-03-21 (×2): qty 0.4

## 2023-03-21 MED ORDER — SODIUM CHLORIDE 0.9 % IV SOLN
1.0000 g | Freq: Once | INTRAVENOUS | Status: AC
  Administered 2023-03-21: 1 g via INTRAVENOUS
  Filled 2023-03-21: qty 10

## 2023-03-21 MED ORDER — PREGABALIN 75 MG PO CAPS
150.0000 mg | ORAL_CAPSULE | Freq: Three times a day (TID) | ORAL | Status: DC
  Administered 2023-03-21 – 2023-03-23 (×5): 150 mg via ORAL
  Filled 2023-03-21 (×2): qty 2
  Filled 2023-03-21: qty 3
  Filled 2023-03-21 (×2): qty 2

## 2023-03-21 MED ORDER — OXYCODONE HCL 5 MG PO TABS
10.0000 mg | ORAL_TABLET | ORAL | Status: DC | PRN
  Administered 2023-03-21: 10 mg via ORAL
  Filled 2023-03-21: qty 2

## 2023-03-21 MED ORDER — SODIUM CHLORIDE 0.9 % IV SOLN
500.0000 mg | INTRAVENOUS | Status: DC
  Administered 2023-03-21 – 2023-03-22 (×2): 500 mg via INTRAVENOUS
  Filled 2023-03-21 (×2): qty 5

## 2023-03-21 MED ORDER — ACETAMINOPHEN 325 MG PO TABS: 650.0000 mg | ORAL_TABLET | Freq: Four times a day (QID) | ORAL | Status: DC | PRN

## 2023-03-21 MED ORDER — ONDANSETRON HCL 4 MG PO TABS: 4.0000 mg | ORAL_TABLET | Freq: Four times a day (QID) | ORAL | Status: DC | PRN

## 2023-03-21 MED ORDER — OXYCODONE HCL 5 MG PO TABS
5.0000 mg | ORAL_TABLET | Freq: Two times a day (BID) | ORAL | Status: DC
  Administered 2023-03-21: 5 mg via ORAL
  Filled 2023-03-21 (×2): qty 1

## 2023-03-21 MED ORDER — SODIUM CHLORIDE 0.9 % IV SOLN
2.0000 g | INTRAVENOUS | Status: DC
  Administered 2023-03-22: 2 g via INTRAVENOUS
  Filled 2023-03-21 (×2): qty 20

## 2023-03-21 MED ORDER — ACETAMINOPHEN 650 MG RE SUPP: 650.0000 mg | Freq: Four times a day (QID) | RECTAL | Status: DC | PRN

## 2023-03-21 MED ORDER — SODIUM CHLORIDE 0.9 % IV SOLN
500.0000 mg | Freq: Once | INTRAVENOUS | Status: DC
  Filled 2023-03-21: qty 5

## 2023-03-21 MED ORDER — GUAIFENESIN-DM 100-10 MG/5ML PO SYRP
10.0000 mL | ORAL_SOLUTION | ORAL | Status: DC | PRN
  Administered 2023-03-22 – 2023-03-23 (×2): 10 mL via ORAL
  Filled 2023-03-21 (×2): qty 10

## 2023-03-21 MED ORDER — METHYLPREDNISOLONE SODIUM SUCC 40 MG IJ SOLR
40.0000 mg | Freq: Two times a day (BID) | INTRAMUSCULAR | Status: DC
  Administered 2023-03-22: 40 mg via INTRAVENOUS
  Filled 2023-03-21: qty 1

## 2023-03-21 MED ORDER — METHYLPREDNISOLONE SODIUM SUCC 125 MG IJ SOLR
125.0000 mg | Freq: Once | INTRAMUSCULAR | Status: AC
  Administered 2023-03-21: 125 mg via INTRAVENOUS
  Filled 2023-03-21: qty 2

## 2023-03-21 MED ORDER — ONDANSETRON HCL 4 MG/2ML IJ SOLN: 4.0000 mg | Freq: Four times a day (QID) | INTRAMUSCULAR | Status: DC | PRN

## 2023-03-21 MED ORDER — UMECLIDINIUM-VILANTEROL 62.5-25 MCG/ACT IN AEPB
1.0000 | INHALATION_SPRAY | Freq: Every day | RESPIRATORY_TRACT | Status: DC
  Administered 2023-03-22 – 2023-03-23 (×2): 1 via RESPIRATORY_TRACT
  Filled 2023-03-21 (×2): qty 14

## 2023-03-21 MED ORDER — ANORO ELLIPTA 62.5-25 MCG/ACT IN AEPB
1.0000 | INHALATION_SPRAY | Freq: Every day | RESPIRATORY_TRACT | 6 refills | Status: DC
Start: 2023-03-21 — End: 2024-01-02
  Filled 2023-03-21: qty 60, 30d supply, fill #0
  Filled 2023-04-22: qty 60, 30d supply, fill #1
  Filled 2023-05-21: qty 60, 30d supply, fill #2
  Filled 2023-06-22: qty 60, 30d supply, fill #3
  Filled 2023-07-23: qty 60, 30d supply, fill #4
  Filled 2023-08-20: qty 60, 30d supply, fill #5
  Filled 2023-09-19: qty 60, 30d supply, fill #6

## 2023-03-21 NOTE — Telephone Encounter (Signed)
Called and spoke to patient's spouse, Eric(DPR). Minerva Areola stated that patient developed dry cough,chest tightness, chills, wheezing and increased SOB with exertion x5-7d. Denied f/s or additional sx.  She has not tested for covid. Patient has had to increase oxygen usage. She typically wears it QHS, however she has had to use it during the day other the past week. Spo2 fluctuating between 80-90% on 2-2.5L. She is using albuterol nebulizer Q6H, Anoro once daily and tussin Q4H and Deslym Q12H with some relief in sx.   Dr. Aundria Rud, please advise. Thanks

## 2023-03-21 NOTE — Assessment & Plan Note (Signed)
On chemotherapy with Arimidex and monthly Zoladex Follows with oncologist, Dr. Smith Robert

## 2023-03-21 NOTE — Telephone Encounter (Signed)
Pt oxygen's levels fluctuating from low 80 to mid 90 for 5-7 days. Wearing continuous oxygen day and night, used to only use it at night. When she excerpts her self she get SOB. Developed a dry cough. Tried robitussin, can't get any sleep bc of her coughing. Has used her breathing treatment and rescue inhaler  Pharmacy: [facility]

## 2023-03-21 NOTE — ED Provider Notes (Signed)
[facility] Provider Note    Event Date/Time   First MD Initiated Contact with Patient 03/21/23 1506     (approximate)   History   Shortness of Breath   HPI  Tricia Berry is a 52 y.o. female past medical history significant for breast cancer s/p chemotherapy, resection and immunotherapy, developed inflammatory pneumonia on chronic 2 L home oxygen at nighttime only, who presents to the emergency department with progressively worsening shortness of breath.  Patient states that over the past 1 week she has been wearing her oxygen at all times at 2 L.  Endorses shortness of breath and productive cough.  Denies any recent antibiotic use.  Denies any chest pain.  No history of DVT or PE.  Followed by pulmonology.     Physical Exam   Triage Vital Signs: ED Triage Vitals  Enc Vitals Group     BP 03/21/23 1307 106/65     Pulse Rate 03/21/23 1307 69     Resp 03/21/23 1307 18     Temp 03/21/23 1306 98.7 F (37.1 C)     Temp Source 03/21/23 1306 Oral     SpO2 03/21/23 1307 97 %     Weight 03/21/23 1307 136 lb (61.7 kg)     Height 03/21/23 1307 5' (1.524 m)     Head Circumference --      Peak Flow --      Pain Score --      Pain Loc --      Pain Edu? --      Excl. in GC? --     Most recent vital signs: Vitals:   03/21/23 2121 03/21/23 2154  BP: 105/61 125/73  Pulse: 71 72  Resp:    Temp: 98.2 F (36.8 C) 98.2 F (36.8 C)  SpO2: 99% 95%    Physical Exam Constitutional:      Appearance: She is well-developed.  HENT:     Head: Atraumatic.  Eyes:     Conjunctiva/sclera: Conjunctivae normal.  Cardiovascular:     Rate and Rhythm: Regular rhythm.  Pulmonary:     Effort: Tachypnea and respiratory distress present.     Breath sounds: Wheezing and rhonchi present.  Abdominal:     General: There is no distension.  Musculoskeletal:        General: Normal range of motion.     Cervical back: Normal range of motion.     Right lower leg: No  edema.     Left lower leg: No edema.  Skin:    General: Skin is warm.  Neurological:     Mental Status: She is alert. Mental status is at baseline.     IMPRESSION / MDM / ASSESSMENT AND PLAN / ED COURSE  I reviewed the triage vital signs and the nursing notes.  On chart review patient has been followed by pulmonology for inflammatory lung disease, does have a history of soft blood pressures in the past.  Differential diagnosis including pneumonia, inflammatory lung disease exacerbation, pulmonary embolism, ACS, anemia  EKG  I, Corena Herter, the attending physician, personally viewed and interpreted this ECG.   Rate: Normal  Rhythm: Normal sinus  Axis: Normal  Intervals: Normal  ST&T Change: None  No tachycardic or bradycardic dysrhythmias while on cardiac telemetry.  RADIOLOGY I independently reviewed imaging, my interpretation of imaging: CTA with no signs of pulmonary embolism.  Significant groundglass opacities to throughout bilateral lungs.  No focal findings of pneumonia.  Read as  no PE.  Worsening parabronchial groundglass opacities concerning for possible infectious versus inflammatory disease.  Stable lymph nodes.  LABS (all labs ordered are listed, but only abnormal results are displayed) Labs interpreted as -    Labs Reviewed  COMPREHENSIVE METABOLIC PANEL - Abnormal; Notable for the following components:      Result Value   Glucose, Bld 147 (*)    Creatinine, Ser 1.15 (*)    Albumin 3.4 (*)    GFR, Estimated 57 (*)    All other components within normal limits  CULTURE, BLOOD (ROUTINE X 2)  CULTURE, BLOOD (ROUTINE X 2)  CBC WITH DIFFERENTIAL/PLATELET  BRAIN NATRIURETIC PEPTIDE  HIV ANTIBODY (ROUTINE TESTING W REFLEX)     MDM    Patient on new home 2 L of oxygen, ambulated back from the bathroom and found to be hypoxic to 83%.  On 2 L nasal cannula at rest oxygen is in the 90s.  Did have a soft blood pressure in the emergency department with a 6  systolic blood pressure of 80.  Blood cultures added on and patient started on IV antibiotics to cover for community-acquired pneumonia.  Given a dose of IV Solu-Medrol and DuoNeb treatments.  Consulted hospitalist for admission for acute hypoxic respiratory failure.   PROCEDURES:  Critical Care performed: No  Procedures  Patient's presentation is most consistent with acute presentation with potential threat to life or bodily function.   MEDICATIONS ORDERED IN ED: Medications  oxyCODONE (Oxy IR/ROXICODONE) immediate release tablet 5 mg (5 mg Oral Given 03/21/23 2209)  rosuvastatin (CRESTOR) tablet 5 mg (has no administration in time range)  baclofen (LIORESAL) tablet 10 mg (has no administration in time range)  pregabalin (LYRICA) capsule 150 mg (150 mg Oral Given 03/21/23 2112)  guaiFENesin-dextromethorphan (ROBITUSSIN DM) 100-10 MG/5ML syrup 10 mL (has no administration in time range)  enoxaparin (LOVENOX) injection 40 mg (40 mg Subcutaneous Patient Refused/Not Given 03/21/23 2112)  acetaminophen (TYLENOL) tablet 650 mg (has no administration in time range)    Or  acetaminophen (TYLENOL) suppository 650 mg (has no administration in time range)  ondansetron (ZOFRAN) tablet 4 mg (has no administration in time range)    Or  ondansetron (ZOFRAN) injection 4 mg (has no administration in time range)  cefTRIAXone (ROCEPHIN) 2 g in sodium chloride 0.9 % 100 mL IVPB (has no administration in time range)  azithromycin (ZITHROMAX) 500 mg in sodium chloride 0.9 % 250 mL IVPB (500 mg Intravenous New Bag/Given 03/21/23 2203)  methylPREDNISolone sodium succinate (SOLU-MEDROL) 40 mg/mL injection 40 mg (has no administration in time range)    Followed by  predniSONE (DELTASONE) tablet 40 mg (has no administration in time range)  albuterol (PROVENTIL) (2.5 MG/3ML) 0.083% nebulizer solution 2.5 mg (has no administration in time range)  umeclidinium-vilanterol (ANORO ELLIPTA) 62.5-25 MCG/ACT 1 puff (has no  administration in time range)  oxyCODONE (Oxy IR/ROXICODONE) immediate release tablet 10 mg (10 mg Oral Given 03/21/23 2216)  iohexol (OMNIPAQUE) 350 MG/ML injection 75 mL (75 mLs Intravenous Contrast Given 03/21/23 1604)  sodium chloride 0.9 % bolus 1,000 mL (1,000 mLs Intravenous New Bag/Given 03/21/23 1847)  methylPREDNISolone sodium succinate (SOLU-MEDROL) 125 mg/2 mL injection 125 mg (125 mg Intravenous Given 03/21/23 2025)  cefTRIAXone (ROCEPHIN) 1 g in sodium chloride 0.9 % 100 mL IVPB (0 g Intravenous Stopped 03/21/23 2153)  ipratropium-albuterol (DUONEB) 0.5-2.5 (3) MG/3ML nebulizer solution 6 mL (6 mLs Nebulization Given 03/21/23 2027)  guaiFENesin (ROBITUSSIN) 100 MG/5ML liquid 200 mg (200 mg Oral Given 03/21/23  2112)    FINAL CLINICAL IMPRESSION(S) / ED DIAGNOSES   Final diagnoses:  Hypoxia  Shortness of breath     Rx / DC Orders   ED Discharge Orders     None        Note:  This document was prepared using Dragon voice recognition software and may include unintentional dictation errors.   Corena Herter, MD 03/21/23 [phone]

## 2023-03-21 NOTE — Telephone Encounter (Signed)
Noted  

## 2023-03-21 NOTE — ED Notes (Signed)
Report given to winnie RN

## 2023-03-21 NOTE — Assessment & Plan Note (Addendum)
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  Patient follows as outpatient

## 2023-03-21 NOTE — Telephone Encounter (Signed)
Dr. Virgil Benedict, please advise. Anoro is not mentioned in last OV.

## 2023-03-21 NOTE — Assessment & Plan Note (Signed)
Continue Lyrica and oxycodone

## 2023-03-21 NOTE — ED Triage Notes (Signed)
PT presents to ED via POV for SOB. Pt is on O@ 2l at home. Pt states SOB, wheezing and non productive cough worsening over past week. Called pulmonologist and was advised to come to ER. Denies any pain

## 2023-03-21 NOTE — Telephone Encounter (Signed)
Per Dr. Aundria Rud via epic secure chat- recommend ED eval.  Spoke to both patient and patient's spouse, Eric(DPR). Both are aware of recommendations and voiced their understanding.  Patient will present to ED.  Nothing further needed.

## 2023-03-21 NOTE — ED Notes (Signed)
Patient transported to CT 

## 2023-03-21 NOTE — H&P (Signed)
History and Physical    Patient: Tricia Berry QIH:[id number] DOB: [birth year] DOA: 03/21/2023 DOS: the patient was seen and examined on 03/21/2023 PCP: Allegra Grana, FNP  Patient coming from: Home  Chief Complaint:  Chief Complaint  Patient presents with   Shortness of Breath    HPI: Tricia Berry is a 52 y.o. 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  She called and spoke with her pulmonologist who advised her to present to the ED.  She denies chest pain, fever or chills. ED course and data review: Vitals within normal limits.  CBC normal, BNP 57.9, CMP unremarkable. EKG, personally viewed and interpreted showing NSR at 70 with no acute ST-T wave changes. CT angio of the chest negative for PE but showing interval worsening of her Marina Goodell bronchovascular groundglass and consolidative airspace opacities representing infection versus inflammation further detailed as follows: IMPRESSION: 1. No pulmonary embolus. 2. Interval worsening of peribronchovascular ground-glass and consolidative airspace opacities most prominent in the right upper lobe. Persistent underlying mild bronchiectasis and bronchial wall thickening. Findings may represent infection versus inflammation. 3. Stable enlarged precarinal 1.2 cm lymph node. 4. Persistent vague asymmetry of the left lateral chest wall serratus anterior musculature. Finding may be postsurgical.  Patient treated with Rocephin and azithromycin, DuoNebs and Solu-Medrol. Hospitalist consulted for  admission.   Review of Systems: As mentioned in the history of present illness. All other systems reviewed and are negative.  Past Medical History:  Diagnosis Date   Anemia    Anginal pain (HCC)    excertion not cardiac related per patient   Anxiety    Breast cancer (HCC) 12/2021   left breast IMC   Diverticulitis    Dyspnea    Dysrhythmia    prolong QT   Family history of ovarian cancer    GERD (gastroesophageal reflux disease)    Headache    IBS (irritable bowel syndrome)    Neuromuscular disorder (HCC)    neuropathy from chemo   Pneumonia    PONV (postoperative nausea and vomiting)    severe migraine and vomiting post anesthesia   Scoliosis    Past Surgical History:  Procedure Laterality Date   ABDOMINAL HYSTERECTOMY     still has ovaries, no gyn cancer, hysterectomy due to endometriosis. NO cervix on exam 01/10/21   BACK SURGERY     BREAST BIOPSY Left 09/15/2020   [place] bx of mass, path pending, Q marker   BREAST BIOPSY Left 09/15/2020   [place] bx of LN, hydromarker, path pending   BREAST RECONSTRUCTION WITH PLACEMENT OF TISSUE EXPANDER AND FLEX HD (ACELLULAR HYDRATED DERMIS) Bilateral 03/17/2021   Procedure: IMMEDIATE BILATERAL BREAST RECONSTRUCTION WITH PLACEMENT OF TISSUE EXPANDER AND FLEX HD (ACELLULAR HYDRATED DERMIS);  Surgeon: Peggye Form, DO;  Location: [facility];  Service: Plastics;  Laterality: Bilateral;   CAPSULECTOMY Right 10/18/2022   Procedure: Right breast capsule release with adjustment;  Surgeon: Peggye Form, DO;  Location: [facility];  Service: Plastics;  Laterality: Right;   COLONOSCOPY WITH PROPOFOL N/A 11/21/2021   Procedure: COLONOSCOPY WITH PROPOFOL;  Surgeon: Toney Reil, MD;  Location: [facility];  Service: Gastroenterology;  Laterality: N/A;   FLEXIBLE BRONCHOSCOPY Bilateral 12/01/2022   Procedure: FLEXIBLE BRONCHOSCOPY;  Surgeon: Raechel Chute, MD;  Location: [facility];  Service: Pulmonary;   Laterality: Bilateral;   IR IMAGING GUIDED PORT INSERTION  10/01/2020   MODIFIED MASTECTOMY Left 03/17/2021   Procedure: LEFT MODIFIED RADICAL MASTECTOMY;  Surgeon: Harriette Bouillon, MD;  Location: [facility];  Service: General;  Laterality: Left;   PORTA CATH REMOVAL Right 03/17/2021   Procedure: PORTA CATH REMOVAL;  Surgeon: Harriette Bouillon, MD;  Location: [facility];  Service: General;  Laterality: Right;   REMOVAL OF BILATERAL TISSUE EXPANDERS WITH PLACEMENT OF BILATERAL BREAST IMPLANTS Bilateral 05/09/2021   Procedure: REMOVAL OF BILATERAL TISSUE EXPANDERS WITH PLACEMENT OF BILATERAL BREAST IMPLANTS;  Surgeon: Peggye Form, DO;  Location: [facility];  Service: Plastics;  Laterality: Bilateral;  90 min   TOTAL MASTECTOMY Right 03/17/2021   Procedure: TOTAL MASTECTOMY;  Surgeon: Harriette Bouillon, MD;  Location: [facility];  Service: General;  Laterality: Right;   Social History:  reports that she has been smoking cigarettes. She has a 20.00 pack-year smoking history. She has never used smokeless tobacco. She reports that she does not currently use alcohol. She reports that she does not use drugs.  Allergies  Allergen Reactions   Morphine Nausea And Vomiting and Other (See Comments)    migranes Other reaction(s): Headache Migraine, vomiting   Sertraline Hcl     REACTION: Worsened symptoms of IBS   Sulfa Antibiotics Rash   Sulfamethoxazole Rash   Sulfonamide Derivatives Rash    Family History  Problem Relation Age of Onset   Hypertension Mother    Osteoarthritis Mother    Diverticulitis Mother    Heart failure Father    Hypertension Father    Gout Father    Heart attack Father 31   Diverticulitis Brother    Ovarian cancer Paternal Grandmother     Prior to Admission medications   Medication Sig Start Date End Date Taking? Authorizing Provider  acetaminophen (TYLENOL) 650 MG CR tablet Take 1,300 mg by mouth  every 8 (eight) hours as needed for pain.    [provider]  albuterol (PROVENTIL) (2.5 MG/3ML) 0.083% nebulizer solution Take 3 mLs (2.5 mg total) by nebulization every 6 (six) hours as needed for wheezing or shortness of breath. 11/23/22   Alford Highland, MD  albuterol (VENTOLIN HFA) 108 (90 Base) MCG/ACT inhaler Inhale 2 puffs into the lungs every 6 (six) hours as needed for wheezing or shortness of breath. 12/18/22   Allegra Grana, FNP  anastrozole (ARIMIDEX) 1 MG tablet Take 1 tablet (1 mg total) by mouth daily. 03/20/23   Creig Hines, MD  atovaquone (MEPRON) 750 MG/5ML suspension Take 10 mLs (1,500 mg total) by mouth daily. 02/21/23   Raechel Chute, MD  b complex vitamins capsule Take 1 capsule by mouth daily.    [provider]  baclofen (LIORESAL) 10 MG tablet Take 1 tablet (10 mg total) by mouth 3 (three) times daily as needed for muscle spasms. 12/08/22   Henreitta Leber, MD  Blood Glucose Monitoring Suppl (BLOOD GLUCOSE MONITOR SYSTEM) w/Device KIT Test in the morning, at noon, and at bedtime. 12/06/22   Allegra Grana, FNP  calcium-vitamin D (OSCAL WITH D) 500-5 MG-MCG tablet Take 1 tablet by mouth daily with breakfast.    [provider]  cholecalciferol (VITAMIN D3) 25 MCG (1000 UNIT) tablet Take 1,000  Units by mouth daily.    [provider]  cyanocobalamin (VITAMIN B12) 1000 MCG/ML injection Inject 1 mL (1,000 mcg total) into the muscle every 30 (thirty) days. 12/06/22   Allegra Grana, FNP  dextromethorphan-guaiFENesin (ROBITUSSIN-DM) 10-100 MG/5ML liquid Take 10 mLs by mouth every 4 (four) hours as needed for cough.    [provider]  docusate sodium (COLACE) 100 MG capsule Take 100 mg by mouth daily as needed for mild constipation.    [provider]  FLUoxetine (PROZAC) 20 MG capsule Take 1 capsule (20 mg total) by mouth every morning. 05/02/22   Allegra Grana, FNP  gentamicin ointment (GARAMYCIN) 0.1 % Apply to  nose 3 (three) times daily. 01/18/23     gentamicin ointment (GARAMYCIN) 0.1 % Apply 1 Application topically to nose 3 (three) times daily. 02/12/23     ibuprofen (ADVIL) 400 MG tablet Take 400 mg by mouth every 6 (six) hours as needed.    [provider]  Lactobacillus (PROBIOTIC ACIDOPHILUS PO) Take 1 capsule by mouth daily.    [provider]  LORazepam (ATIVAN) 0.5 MG tablet Take 1 tablet (0.5 mg total) by mouth 2 (two) times daily as needed for anxiety. 02/21/23   Allegra Grana, FNP  mirabegron ER (MYRBETRIQ) 50 MG TB24 tablet Take 1 tablet (50 mg total) by mouth daily. 02/21/23   Vaillancourt, Lelon Mast, PA-C  Multiple Vitamins-Minerals (MULTIVITAL PO) Take 1 Dose by mouth daily.    [provider]  naloxone [facility]) nasal spray 4 mg/0.1 mL Place 1 spray into the nose as needed for opioid-induced respiratory depresssion. In case of emergency (overdose), spray once into each nostril. If no response within 3 minutes, repeat application and call 911. 08/14/22 08/14/23  Delano Metz, MD  nicotine (NICODERM CQ - DOSED IN MG/24 HOURS) 21 mg/24hr patch Place 1 patch (21 mg total) onto the skin daily. 01/22/23 01/22/24  Allegra Grana, FNP  oxyCODONE (OXY IR/ROXICODONE) 5 MG immediate release tablet Take 1 tablet (5 mg total) by mouth 2 (two) times daily. Must last 30 days. 11/15/22 12/17/22  Delano Metz, MD  oxyCODONE (OXY IR/ROXICODONE) 5 MG immediate release tablet Take 1 tablet (5 mg total) by mouth every 4 (four) hours as needed for pain. 01/22/23     Oxycodone HCl 10 MG TABS Take 1 tablet (10 mg total) by mouth every 4 (four) hours as needed for pain. 02/05/23     Oxycodone HCl 10 MG TABS Take 1 tablet by mouth every four hours as needed for pain 03/05/23     Oxycodone HCl 10 MG TABS Take 1 tablet by mouth every four hours as needed for pain 03/05/23     predniSONE (DELTASONE) 20 MG tablet Take 1 tablet (20 mg total) by mouth daily. 03/20/23 04/19/23  Raechel Chute, MD   pregabalin (LYRICA) 150 MG capsule Take 1 capsule (150 mg total) by mouth in the morning, at noon, and at bedtime. 12/29/22   Allegra Grana, FNP  rosuvastatin (CRESTOR) 5 MG tablet Take 1 tablet (5 mg total) by mouth daily. 03/20/23   Allegra Grana, FNP  trimethoprim (TRIMPEX) 100 MG tablet Take 1 tablet (100 mg total) by mouth daily. 04/26/22   Vanna Scotland, MD  umeclidinium-vilanterol [facility] ELLIPTA) 62.5-25 MCG/ACT AEPB Inhale 1 puff into the lungs daily. 03/21/23   Raechel Chute, MD  vitamin B-12 (CYANOCOBALAMIN) 100 MCG tablet Take 100 mcg by mouth daily. gummy    [provider]    Physical  Exam: Vitals:   03/21/23 1711 03/21/23 1824 03/21/23 1911 03/21/23 1938  BP: (!) 84/50 121/70    Pulse: 67 74    Resp: 17 20    Temp:    98.6 F (37 C)  TempSrc:    Oral  SpO2: 98% 100% 100%   Weight:      Height:       Physical Exam Vitals and nursing note reviewed.  Constitutional:      General: She is not in acute distress. HENT:     Head: Normocephalic and atraumatic.  Cardiovascular:     Rate and Rhythm: Normal rate and regular rhythm.     Heart sounds: Normal heart sounds.  Pulmonary:     Effort: Pulmonary effort is normal.     Breath sounds: Wheezing and rhonchi present.  Abdominal:     Palpations: Abdomen is soft.     Tenderness: There is no abdominal tenderness.  Neurological:     Mental Status: Mental status is at baseline.     Labs on Admission: I have personally reviewed following labs and imaging studies  CBC: Recent Labs  Lab 03/21/23 1514  WBC 6.4  NEUTROABS 5.1  HGB 12.4  HCT 39.2  MCV 92.5  PLT 179   Basic Metabolic Panel: Recent Labs  Lab 03/21/23 1514  NA 139  K 4.1  CL 100  CO2 28  GLUCOSE 147*  BUN 9  CREATININE 1.15*  CALCIUM 9.0   GFR: Estimated Creatinine Clearance: 47 mL/min (A) (by C-G formula based on SCr of 1.15 mg/dL (H)). Liver Function Tests: Recent Labs  Lab 03/21/23 1514  AST 25  ALT 22  ALKPHOS 69   BILITOT 0.4  PROT 7.1  ALBUMIN 3.4*   No results for input(s): "LIPASE", "AMYLASE" in the last 168 hours. No results for input(s): "AMMONIA" in the last 168 hours. Coagulation Profile: No results for input(s): "INR", "PROTIME" in the last 168 hours. Cardiac Enzymes: No results for input(s): "CKTOTAL", "CKMB", "CKMBINDEX", "TROPONINI" in the last 168 hours. BNP (last 3 results) No results for input(s): "PROBNP" in the last 8760 hours. HbA1C: No results for input(s): "HGBA1C" in the last 72 hours. CBG: No results for input(s): "GLUCAP" in the last 168 hours. Lipid Profile: No results for input(s): "CHOL", "HDL", "LDLCALC", "TRIG", "CHOLHDL", "LDLDIRECT" in the last 72 hours. Thyroid Function Tests: No results for input(s): "TSH", "T4TOTAL", "FREET4", "T3FREE", "THYROIDAB" in the last 72 hours. Anemia Panel: No results for input(s): "VITAMINB12", "FOLATE", "FERRITIN", "TIBC", "IRON", "RETICCTPCT" in the last 72 hours. Urine analysis:    Component Value Date/Time   COLORURINE YELLOW 10/07/2021 1408   APPEARANCEUR Clear 02/13/2023 0923   LABSPEC 1.025 10/07/2021 1408   PHURINE 5.5 10/07/2021 1408   GLUCOSEU Negative 02/13/2023 0923   GLUCOSEU NEGATIVE 09/28/2021 1547   HGBUR MODERATE (A) 10/07/2021 1408   HGBUR small 12/29/2008 1525   BILIRUBINUR Negative 02/13/2023 0923   KETONESUR NEGATIVE 10/07/2021 1408   PROTEINUR Negative 02/13/2023 0923   PROTEINUR NEGATIVE 10/07/2021 1408   UROBILINOGEN 0.2 09/28/2021 1547   NITRITE Negative 02/13/2023 0923   NITRITE NEGATIVE 10/07/2021 1408   LEUKOCYTESUR Negative 02/13/2023 0923   LEUKOCYTESUR NEGATIVE 10/07/2021 1408    Radiological Exams on Admission: CT Angio Chest PE W/Cm &/Or Wo Cm  Result Date: 03/21/2023 CLINICAL DATA:  Pulmonary embolism (PE) suspected, high prob. Pt states SOB, wheezing and non productive cough worsening over past week. EXAM: CT ANGIOGRAPHY CHEST WITH CONTRAST TECHNIQUE: Multidetector CT imaging of the  chest  was performed using the standard protocol during bolus administration of intravenous contrast. Multiplanar CT image reconstructions and MIPs were obtained to evaluate the vascular anatomy. RADIATION DOSE REDUCTION: This exam was performed according to the departmental dose-optimization program which includes automated exposure control, adjustment of the mA and/or kV according to patient size and/or use of iterative reconstruction technique. CONTRAST:  75mL OMNIPAQUE IOHEXOL 350 MG/ML SOLN COMPARISON:  CT chest 01/11/2023 trauma CT angio chest 08/30/2022, PET CT 04/18/2021 FINDINGS: Cardiovascular: Satisfactory opacification of the pulmonary arteries to the segmental level. No evidence of pulmonary embolism. Normal heart size. No significant pericardial effusion. The thoracic aorta is normal in caliber. No atherosclerotic plaque of the thoracic aorta. No coronary artery calcifications. Mediastinum/Nodes: Stable enlarged precarinal 1.2 cm lymph node. Status post left axillary lymph node dissection. No enlarged mediastinal, hilar, or axillary lymph nodes. Thyroid gland, trachea, and esophagus demonstrate no significant findings. Lungs/Pleura: Interval worsening of peribronchovascular ground-glass and consolidative airspace opacities most prominent in the right upper lobe. Persistent underlying mild bronchiectasis and bronchial wall thickening. No pulmonary nodule. No pulmonary mass. No pleural effusion. No pneumothorax. Upper Abdomen: No acute abnormality. Musculoskeletal: Bilateral breast implants intact. Persistent vague asymmetry of the left lateral chest wall serratus anterior musculature (6:211). No suspicious lytic or blastic osseous lesions. No acute displaced fracture. Review of the MIP images confirms the above findings. IMPRESSION: 1. No pulmonary embolus. 2. Interval worsening of peribronchovascular ground-glass and consolidative airspace opacities most prominent in the right upper lobe. Persistent  underlying mild bronchiectasis and bronchial wall thickening. Findings may represent infection versus inflammation. 3. Stable enlarged precarinal 1.2 cm lymph node. 4. Persistent vague asymmetry of the left lateral chest wall serratus anterior musculature. Finding may be postsurgical. Recommend attention on follow-up Electronically Signed   By: Tish Frederickson M.D.   On: 03/21/2023 19:06   DG Chest 2 View  Result Date: 03/21/2023 CLINICAL DATA:  Shortness of breath EXAM: CHEST - 2 VIEW COMPARISON:  11/21/2022 and older.  CT 01/11/2023 FINDINGS: No pneumothorax, effusion or edema. Normal cardiopericardial silhouette. Slight interstitial and parenchymal opacities are again seen but decreased from previous examination. Curvature of the spine.  Left axillary surgical clips. IMPRESSION: Subtle patchy and interstitial changes of the lungs but decreased from the prior examination. Please correlate with history recommend continued follow-up. Electronically Signed   By: Karen Kays M.D.   On: 03/21/2023 13:48     Data Reviewed: Relevant notes from primary care and specialist visits, past discharge summaries as available in EHR, including Care Everywhere. 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  Patient follows as outpatient  Malignant neoplasm of left female breast [facility]) On chemotherapy with Arimidex and monthly  Zoladex Follows with oncologist, Dr. Smith Robert  Chronic low back pain (1ry area of Pain) (Bilateral) (R>L) w/o sciatica Continue Lyrica and oxycodone        DVT prophylaxis: Lovenox  Consults: none  Advance Care Planning:   Code Status: Prior   Family Communication: none  Disposition Plan: Back to previous home environment  Severity of Illness: The  appropriate patient status for this patient is INPATIENT. Inpatient status is judged to be reasonable and necessary in order to provide the required intensity of service to ensure the patient's safety. The patient's presenting symptoms, physical exam findings, and initial radiographic and laboratory data in the context of their chronic comorbidities is felt to place them at high risk for further clinical deterioration. Furthermore, it is not anticipated that the patient will be medically stable for discharge from the hospital within 2 midnights of admission.   * I certify that at the point of admission it is my clinical judgment that the patient will require inpatient hospital care spanning beyond 2 midnights from the point of admission due to high intensity of service, high risk for further deterioration and high frequency of surveillance required.*  Author: Andris Baumann, MD 03/21/2023 8:38 PM  For on call review [email or web address].

## 2023-03-22 ENCOUNTER — Other Ambulatory Visit: Payer: Self-pay

## 2023-03-22 ENCOUNTER — Other Ambulatory Visit: Payer: Self-pay | Admitting: Family

## 2023-03-22 DIAGNOSIS — J984 Other disorders of lung: Secondary | ICD-10-CM

## 2023-03-22 DIAGNOSIS — J849 Interstitial pulmonary disease, unspecified: Secondary | ICD-10-CM | POA: Diagnosis not present

## 2023-03-22 DIAGNOSIS — C50112 Malignant neoplasm of central portion of left female breast: Secondary | ICD-10-CM

## 2023-03-22 DIAGNOSIS — J9621 Acute and chronic respiratory failure with hypoxia: Secondary | ICD-10-CM | POA: Diagnosis not present

## 2023-03-22 LAB — PROCALCITONIN: Procalcitonin: <0.1 ng/mL

## 2023-03-22 LAB — RESPIRATORY PANEL BY PCR

## 2023-03-22 LAB — SARS CORONAVIRUS 2 BY RT PCR: SARS Coronavirus 2 by RT PCR: NEGATIVE

## 2023-03-22 LAB — HIV ANTIBODY (ROUTINE TESTING W REFLEX): HIV Screen 4th Generation wRfx: NONREACTIVE

## 2023-03-22 LAB — CULTURE, BLOOD (ROUTINE X 2): Special Requests: ADEQUATE

## 2023-03-22 MED ORDER — OXYCODONE HCL 5 MG PO TABS
5.0000 mg | ORAL_TABLET | ORAL | Status: DC | PRN
  Administered 2023-03-22 – 2023-03-23 (×3): 10 mg via ORAL
  Filled 2023-03-22 (×3): qty 2

## 2023-03-22 MED ORDER — FLUOXETINE HCL 20 MG PO CAPS
20.0000 mg | ORAL_CAPSULE | Freq: Every morning | ORAL | Status: DC
  Administered 2023-03-22 – 2023-03-23 (×2): 20 mg via ORAL
  Filled 2023-03-22 (×2): qty 1

## 2023-03-22 MED ORDER — ANASTROZOLE 1 MG PO TABS
1.0000 mg | ORAL_TABLET | Freq: Every day | ORAL | Status: DC
  Administered 2023-03-22 – 2023-03-23 (×2): 1 mg via ORAL
  Filled 2023-03-22 (×2): qty 1

## 2023-03-22 MED ORDER — VITAMIN B-12 100 MCG PO TABS
100.0000 ug | ORAL_TABLET | Freq: Every day | ORAL | Status: DC
  Administered 2023-03-22 – 2023-03-23 (×2): 100 ug via ORAL
  Filled 2023-03-22 (×2): qty 1

## 2023-03-22 MED ORDER — PREDNISONE 20 MG PO TABS
40.0000 mg | ORAL_TABLET | Freq: Every day | ORAL | Status: DC
  Administered 2023-03-23: 40 mg via ORAL
  Filled 2023-03-22: qty 2

## 2023-03-22 MED ORDER — PREDNISONE 20 MG PO TABS: 20.0000 mg | ORAL_TABLET | Freq: Every day | ORAL | Status: DC

## 2023-03-22 MED ORDER — MIRABEGRON ER 50 MG PO TB24
50.0000 mg | ORAL_TABLET | Freq: Every day | ORAL | Status: DC
  Administered 2023-03-22 – 2023-03-23 (×2): 50 mg via ORAL
  Filled 2023-03-22 (×2): qty 1

## 2023-03-22 MED ORDER — LORAZEPAM 0.5 MG PO TABS: 0.5000 mg | ORAL_TABLET | Freq: Two times a day (BID) | ORAL | Status: DC | PRN

## 2023-03-22 NOTE — Consult Note (Signed)
NAME:  Tricia Berry, MRN:  [id number], DOB:  1971-10-20, LOS: 1 ADMISSION DATE:  03/21/2023, CONSULTATION DATE:  03/22/2023 REFERRING MD: Marrion Coy, MD, CHIEF COMPLAINT:  Respiratory Failure   History of Present Illness:   Ms. Tricia Berry is a 52 year old female with a past medical history of breast cancer who developed an inflammatory pneumonia. She was admitted to the hospital for management of acute on chronic hypoxic respiratory failure and has underwent bronchoscopy on 12/01/2022. She has followed with me in clinic and had been on a steroid taper and presents now with acute on chronic hypoxic respiratory failure.   Patient is now presenting with increased shortness of breath of 1 week in duration. Prior to this, she was feeling well and she was actually improving. She'd not been using her oxygen during the day, and was able to do yard work. Overall, she felt her respiratory status was improving up until a week ago. She developed a cough and increased dyspnea with hypoxia. This was not improved with the use of her home oxygen. She called our office and we asked her to come in to the ED for an evaluation. She has not felt sick nor has she had any fevers. On further inquiry, she reports having had increased nasal stuffiness over the past couple of weeks. She also reports her husband has conjunctivitis. She denies fevers, chills, night sweats, hemoptysis, sputum production, or chest pain.  During her initial clinic visit, she reported insidious symptoms in November with shortness of breath and severe hypoxia down to 87% SpO2. CXR at the time showed findings concerning for pneumonia followed by CT scan showing multi-focal infiltrates. Her main symptoms were those of dyspnea and cough. The symptoms started in the summer around the time Abemaciclib was initiated.  She first noticed a cough that happened throughout the summer and culminated in the sudden onset of shortness of breath. Following this, she  was initiated on a course of antibiotics and abemaciclib was halted. She was started on Breztri by her primary care physician which she feels was helpful. We switched her to [facility] following her last visit.   A few days after our initial visit her symptoms worsened and she became increasingly short of breath, with increasing cough. She was seen by her primary care physician and was noted to be very hypoxic, and was sent to the ED. She underwent a CT scan of the chest that showed multi-focal infiltrates and she was admitted for further management. I saw her in consultation in the hospital. Infectious workup sent included a beta-d-glucan, Aspergillus Glucomannan, and sputum induction for cultures and PJP PCR (cultures growing candida). She was starte don 40 mg of oral prednisone with Atovaquone for prophylaxis and was discharged on oxygen. On follow up, we arranged for bronchoscopy (BAL) performed on 2/9.   Patient is currently on 20 mg of prednisone daily, and has continued her Atovaquone. Plan was to decrease steroids to 10 mg daily starting in June. She continues to smoke, and reports smoking less than before.   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  She has received a course of Pertuzumab and Trastuzumab following her resection. She had been on Abemaciclib which was stopped in November of 2023 and has not been initiated since.   Patient used to work as an Insurance underwriter. She is a smoker, and has smoked on  and off since her teenage years. She reports having mold in their bathroom that was professionally remediated.  Chest CT 01/11/2023  1. Persistent bilateral patchy areas of attenuation with mixed peripheral interstitial prominence/fibrosis. This airspace process is less dense/consolidative and more ground-glass compared to previous exams.  Interval improved aeration over the posterior right lower lobe. Findings are likely due to inflammatory process such as an active alveolitis or DIP and less likely chronic infection. High-resolution chest CT may be helpful for further evaluation. 2. Aortic atherosclerosis. Minimal atherosclerotic coronary artery disease.  Chest CT 03/21/2023  Lungs/Pleura: Interval worsening of peribronchovascular ground-glass and consolidative airspace opacities most prominent in the right upper lobe. Persistent underlying mild bronchiectasis and bronchial wall thickening. No pulmonary nodule. No pulmonary mass. No pleural effusion. No pneumothorax.  IMPRESSION: 1. No pulmonary embolus. 2. Interval worsening of peribronchovascular ground-glass and consolidative airspace opacities most prominent in the right upper lobe. Persistent underlying mild bronchiectasis and bronchial wall thickening. Findings may represent infection versus inflammation. 3. Stable enlarged precarinal 1.2 cm lymph node. 4. Persistent vague asymmetry of the left lateral chest wall serratus anterior musculature. Finding may be postsurgical. Recommend attention on follow-up  Objective   Blood pressure 116/77, pulse (!) 59, temperature 97.6 F (36.4 C), temperature source Oral, resp. rate 12, height 5' (1.524 m), weight 61.7 kg, SpO2 97 %.        Intake/Output Summary (Last 24 hours) at 03/22/2023 1354 Last data filed at 03/22/2023 0420 Gross per 24 hour  Intake 1883 ml  Output --  Net 1883 ml   Filed Weights   03/21/23 1307  Weight: 61.7 kg    Examination: Physical Exam Constitutional:      General: She is not in acute distress.    Appearance: She is well-developed. She is not ill-appearing.  HENT:     Head: Normocephalic.     Mouth/Throat:     Mouth: Mucous membranes are moist.  Eyes:     Pupils: Pupils are equal, round, and reactive to light.  Cardiovascular:     Rate and Rhythm: Normal rate and regular  rhythm.     Pulses: Normal pulses.     Heart sounds: Normal heart sounds.  Pulmonary:     Effort: Pulmonary effort is normal.     Breath sounds: Rales present. No wheezing or rhonchi.  Abdominal:     Palpations: Abdomen is soft.  Neurological:     General: No focal deficit present.     Mental Status: She is alert and oriented to person, place, and time. Mental status is at baseline.     Assessment & Plan:   #Acute on Chronic Hypoxic Respiratory Failure #Pneumonitis  Ms. Dykman is presenting to the hospital for acute on chronic hypoxic respiratory failure with possible exacerbation of her underlying interstitial lung disease. Patient provided history suggests she was improving, with an acute exacerbation of one week in duration.  She's had multiple presentations to the hospital for her interstitial lung disease, with CT imaging showing multi-focal infiltrates with ground glass and consolidative opacities. The differential included drug induced pneumonitis (abemaciclib), organizing pneumonia, hypersensitivity pneumonitis, and Dequamative Interstitial Pneumonia (DIP). Any of these processes could have exacerbated with our steroid taper. Furthermore, infectious etiologies are highly likely to have caused this acute decompensation, especially given that her husband is sick with conjunctivitis that is most likely virus. I've ordered a respiratory viral panel and COVID test to assess for infectious risk. She is maintained on PJP prophylaxis with Atovaquone, which should  be continued.  At this moment, I will wait for the infectious workup before proceeding with further investigating her ILD. Should the infectious workup be negative, and her opacities persist on imaging with a step up in her steroid dose, I will strongly consider referring ms. Paddock to thoracic surgery for VATS wedge biopsy to have a definitive diagnosis as to the etiology driving her interstitial lung disease.  -continue  steroids at current dose of 40, consider quick taper back to 20 mg daily (and then continue at 20 until outpatient follow up with me) -follow up infectious workup -strict smoking cessation highly encouraged  Raechel Chute, MD [facility] 03/22/2023 2:27 PM    Labs   CBC: Recent Labs  Lab 03/21/23 1514  WBC 6.4  NEUTROABS 5.1  HGB 12.4  HCT 39.2  MCV 92.5  PLT 179    Basic Metabolic Panel: Recent Labs  Lab 03/21/23 1514  NA 139  K 4.1  CL 100  CO2 28  GLUCOSE 147*  BUN 9  CREATININE 1.15*  CALCIUM 9.0   GFR: Estimated Creatinine Clearance: 47 mL/min (A) (by C-G formula based on SCr of 1.15 mg/dL (H)). Recent Labs  Lab 03/21/23 1514 03/22/23 0454  PROCALCITON  --  <0.10  WBC 6.4  --     Liver Function Tests: Recent Labs  Lab 03/21/23 1514  AST 25  ALT 22  ALKPHOS 69  BILITOT 0.4  PROT 7.1  ALBUMIN 3.4*   No results for input(s): "LIPASE", "AMYLASE" in the last 168 hours. No results for input(s): "AMMONIA" in the last 168 hours.  ABG    Component Value Date/Time   TCO2 27 05/03/2008 2107     Coagulation Profile: No results for input(s): "INR", "PROTIME" in the last 168 hours.  Cardiac Enzymes: No results for input(s): "CKTOTAL", "CKMB", "CKMBINDEX", "TROPONINI" in the last 168 hours.  HbA1C: Hgb A1c MFr Bld  Date/Time Value Ref Range Status  11/24/2022 02:12 AM 5.6 4.8 - 5.6 % Final    Comment:    (NOTE) Pre diabetes:          5.7%-6.4%  Diabetes:              >6.4%  Glycemic control for   <7.0% adults with diabetes     CBG: No results for input(s): "GLUCAP" in the last 168 hours.  Review of Systems:   Review of Systems  Constitutional:  Negative for chills, fever, malaise/fatigue and weight loss.  Respiratory:  Positive for cough and shortness of breath. Negative for hemoptysis, sputum production and wheezing.   Cardiovascular:  Negative for chest pain and palpitations.     Past Medical History:  She,   has a past medical history of Anemia, Anginal pain (HCC), Anxiety, Breast cancer (HCC) (12/2021), Diverticulitis, Dyspnea, Dysrhythmia, Family history of ovarian cancer, GERD (gastroesophageal reflux disease), Headache, IBS (irritable bowel syndrome), Neuromuscular disorder (HCC), Pneumonia, PONV (postoperative nausea and vomiting), and Scoliosis.   Surgical History:   Past Surgical History:  Procedure Laterality Date   ABDOMINAL HYSTERECTOMY     still has ovaries, no gyn cancer, hysterectomy due to endometriosis. NO cervix on exam 01/10/21   BACK SURGERY     BREAST BIOPSY Left 09/15/2020   [place] bx of mass, path pending, Q marker   BREAST BIOPSY Left 09/15/2020   [place] bx of LN, hydromarker, path pending   BREAST RECONSTRUCTION WITH PLACEMENT OF TISSUE EXPANDER AND FLEX HD (ACELLULAR HYDRATED DERMIS) Bilateral 03/17/2021   Procedure: IMMEDIATE  BILATERAL BREAST RECONSTRUCTION WITH PLACEMENT OF TISSUE EXPANDER AND FLEX HD (ACELLULAR HYDRATED DERMIS);  Surgeon: Peggye Form, DO;  Location: [facility];  Service: Plastics;  Laterality: Bilateral;   CAPSULECTOMY Right 10/18/2022   Procedure: Right breast capsule release with adjustment;  Surgeon: Peggye Form, DO;  Location: [facility];  Service: Plastics;  Laterality: Right;   COLONOSCOPY WITH PROPOFOL N/A 11/21/2021   Procedure: COLONOSCOPY WITH PROPOFOL;  Surgeon: Toney Reil, MD;  Location: [facility];  Service: Gastroenterology;  Laterality: N/A;   FLEXIBLE BRONCHOSCOPY Bilateral 12/01/2022   Procedure: FLEXIBLE BRONCHOSCOPY;  Surgeon: Raechel Chute, MD;  Location: [facility];  Service: Pulmonary;  Laterality: Bilateral;   IR IMAGING GUIDED PORT INSERTION  10/01/2020   MODIFIED MASTECTOMY Left 03/17/2021   Procedure: LEFT MODIFIED RADICAL MASTECTOMY;  Surgeon: Harriette Bouillon, MD;  Location: [facility];  Service: General;  Laterality: Left;   PORTA CATH REMOVAL Right 03/17/2021    Procedure: PORTA CATH REMOVAL;  Surgeon: Harriette Bouillon, MD;  Location: [facility];  Service: General;  Laterality: Right;   REMOVAL OF BILATERAL TISSUE EXPANDERS WITH PLACEMENT OF BILATERAL BREAST IMPLANTS Bilateral 05/09/2021   Procedure: REMOVAL OF BILATERAL TISSUE EXPANDERS WITH PLACEMENT OF BILATERAL BREAST IMPLANTS;  Surgeon: Peggye Form, DO;  Location: [facility];  Service: Plastics;  Laterality: Bilateral;  90 min   TOTAL MASTECTOMY Right 03/17/2021   Procedure: TOTAL MASTECTOMY;  Surgeon: Harriette Bouillon, MD;  Location: [facility];  Service: General;  Laterality: Right;     Social History:   reports that she has been smoking cigarettes. She has a 20.00 pack-year smoking history. She has never used smokeless tobacco. She reports that she does not currently use alcohol. She reports that she does not use drugs.   Family History:  Her family history includes Diverticulitis in her brother and mother; Gout in her father; Heart attack (age of onset: 43) in her father; Heart failure in her father; Hypertension in her father and mother; Osteoarthritis in her mother; Ovarian cancer in her paternal grandmother.   Allergies Allergies  Allergen Reactions   Morphine Nausea And Vomiting and Other (See Comments)    migranes Other reaction(s): Headache Migraine, vomiting   Sertraline Hcl     REACTION: Worsened symptoms of IBS   Sulfa Antibiotics Rash   Sulfamethoxazole Rash   Sulfonamide Derivatives Rash     Home Medications  Prior to Admission medications   Medication Sig Start Date End Date Taking? Authorizing Provider  acetaminophen (TYLENOL) 650 MG CR tablet Take 1,300 mg by mouth every 8 (eight) hours as needed for pain.    [provider]  albuterol (PROVENTIL) (2.5 MG/3ML) 0.083% nebulizer solution Take 3 mLs (2.5 mg total) by nebulization every 6 (six) hours as needed for wheezing or shortness of breath. 11/23/22   Alford Highland, MD  albuterol (VENTOLIN HFA) 108 (90 Base) MCG/ACT inhaler Inhale 2 puffs into the lungs every 6 (six) hours as needed for wheezing or shortness of breath. 12/18/22   Allegra Grana, FNP  anastrozole (ARIMIDEX) 1 MG tablet Take 1 tablet (1 mg total) by mouth daily. 03/20/23   Creig Hines, MD  atovaquone (MEPRON) 750 MG/5ML suspension Take 10 mLs (1,500 mg total) by mouth daily. 02/21/23   Raechel Chute, MD  b complex vitamins capsule Take 1 capsule by mouth daily.    [provider]  baclofen (LIORESAL) 10 MG tablet Take 1 tablet (  10 mg total) by mouth 3 (three) times daily as needed for muscle spasms. 12/08/22   Henreitta Leber, MD  Blood Glucose Monitoring Suppl (BLOOD GLUCOSE MONITOR SYSTEM) w/Device KIT Test in the morning, at noon, and at bedtime. 12/06/22   Allegra Grana, FNP  calcium-vitamin D (OSCAL WITH D) 500-5 MG-MCG tablet Take 1 tablet by mouth daily with breakfast.    [provider]  cholecalciferol (VITAMIN D3) 25 MCG (1000 UNIT) tablet Take 1,000 Units by mouth daily.    [provider]  cyanocobalamin (VITAMIN B12) 1000 MCG/ML injection Inject 1 mL (1,000 mcg total) into the muscle every 30 (thirty) days. 12/06/22   Allegra Grana, FNP  dextromethorphan-guaiFENesin (ROBITUSSIN-DM) 10-100 MG/5ML liquid Take 10 mLs by mouth every 4 (four) hours as needed for cough.    [provider]  docusate sodium (COLACE) 100 MG capsule Take 100 mg by mouth daily as needed for mild constipation.    [provider]  FLUoxetine (PROZAC) 20 MG capsule Take 1 capsule (20 mg total) by mouth every morning. 05/02/22   Allegra Grana, FNP  gentamicin ointment (GARAMYCIN) 0.1 % Apply to nose 3 (three) times daily. 01/18/23     gentamicin ointment (GARAMYCIN) 0.1 % Apply 1 Application topically to nose 3 (three) times daily. 02/12/23     ibuprofen (ADVIL) 400 MG tablet Take 400 mg by mouth every 6 (six) hours as needed.    [provider]  Lactobacillus (PROBIOTIC ACIDOPHILUS PO) Take 1 capsule by mouth daily.    [provider]  LORazepam (ATIVAN) 0.5 MG tablet Take 1 tablet (0.5 mg total) by mouth 2 (two) times daily as needed for anxiety. 02/21/23   Allegra Grana, FNP  mirabegron ER (MYRBETRIQ) 50 MG TB24 tablet Take 1 tablet (50 mg total) by mouth daily. 02/21/23   Vaillancourt, Lelon Mast, PA-C  Multiple Vitamins-Minerals (MULTIVITAL PO) Take 1 Dose by mouth daily.    [provider]  naloxone [facility]) nasal spray 4 mg/0.1 mL Place 1 spray into the nose as needed for opioid-induced respiratory depresssion. In case of emergency (overdose), spray once into each nostril. If no response within 3 minutes, repeat application and call 911. 08/14/22 08/14/23  Delano Metz, MD  nicotine (NICODERM CQ - DOSED IN MG/24 HOURS) 21 mg/24hr patch Place 1 patch (21 mg total) onto the skin daily. 01/22/23 01/22/24  Allegra Grana, FNP  oxyCODONE (OXY IR/ROXICODONE) 5 MG immediate release tablet Take 1 tablet (5 mg total) by mouth 2 (two) times daily. Must last 30 days. 11/15/22 12/17/22  Delano Metz, MD  oxyCODONE (OXY IR/ROXICODONE) 5 MG immediate release tablet Take 1 tablet (5 mg total) by mouth every 4 (four) hours as needed for pain. 01/22/23     Oxycodone HCl 10 MG TABS Take 1 tablet (10 mg total) by mouth every 4 (four) hours as needed for pain. 02/05/23     Oxycodone HCl 10 MG TABS Take 1 tablet by mouth every four hours as needed for pain 03/05/23     Oxycodone HCl 10 MG TABS Take 1 tablet by mouth every four hours as needed for pain 03/05/23     predniSONE (DELTASONE) 20 MG tablet Take 1 tablet (20 mg total) by mouth daily. 03/20/23 04/21/23  Raechel Chute, MD  pregabalin (LYRICA) 150 MG capsule Take 1 capsule (150 mg total) by mouth in the morning, at noon, and at bedtime. 12/29/22   Allegra Grana, FNP  rosuvastatin (CRESTOR) 5 MG tablet  Take 1 tablet (5 mg total) by mouth daily. 03/20/23   Allegra Grana, FNP  trimethoprim (TRIMPEX) 100 MG tablet Take 1 tablet (100 mg total) by mouth daily. 04/26/22   Vanna Scotland, MD  umeclidinium-vilanterol [facility] ELLIPTA) 62.5-25 MCG/ACT AEPB Inhale 1 puff into the lungs daily. 03/21/23   Raechel Chute, MD  vitamin B-12 (CYANOCOBALAMIN) 100 MCG tablet Take 100 mcg by mouth daily. gummy    [provider]      I spent 65 minutes caring for this patient today, including preparing to see the patient, obtaining a medical history , reviewing a separately obtained history, performing a medically appropriate examination and/or evaluation, counseling and educating the patient/family/caregiver, ordering medications, tests, or procedures, referring and communicating with other health care professionals (not separately reported), and documenting clinical information in the electronic health record

## 2023-03-22 NOTE — Progress Notes (Signed)
  Progress Note   Patient: Tricia Berry ZOX:[id number] DOB: 09/25/1971 DOA: 03/21/2023     1 DOS: the patient was seen and examined on 03/22/2023   Brief hospital course:  Tricia Berry is a 52 y.o. 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  CT angiogram of the chest rule out PE, showed significant proximal groundglass consolidation in upper lobes.  Patient started on Rocephin and Zithromax and Solu-Medrol.   Principal Problem:   CAP (community acquired pneumonia) Active Problems:   Malignant neoplasm of left female breast (HCC)   Acute on chronic respiratory failure with hypoxia (HCC)   Chronic low back pain (1ry area of Pain) (Bilateral) (R>L) w/o sciatica   Assessment and Plan: *Upper lobe pneumonitis. Acute respiratory failure with hypoxia This appears to be recurrent.  Discussed with Dr. Smith Robert, she does not believe this has anything to do with the radiation chemotherapy. Procalcitonin level less than 0.1, unlikely typical bacterial infection. Will consult pulmonology for guidance.  Malignant neoplasm of left female breast [facility]) Follow-up with oncology as outpatient.  Chronic low back pain (1ry area of Pain) (Bilateral) (R>L) w/o sciatica Continue Lyrica and oxycodone       Subjective:  Patient doing well today, still has a cough, nonproductive.  Short of breath, with exertion.  Physical Exam: Vitals:   03/21/23 2121 03/21/23 2154 03/22/23 0539 03/22/23 0741  BP: 105/61 125/73 100/65 116/77  Pulse: 71 72 (!) 58 (!) 59  Resp:    16 12  Temp: 98.2 F (36.8 C) 98.2 F (36.8 C) 97.6 F (36.4 C) 97.6 F (36.4 C)  TempSrc: Oral Oral  Oral  SpO2: 99% 95% 97% 97%  Weight:      Height:       General exam: Appears calm and comfortable  Respiratory system: Clear to auscultation. Respiratory effort normal. Cardiovascular system: S1 & S2 heard, RRR. No JVD, murmurs, rubs, gallops or clicks. No pedal edema. Gastrointestinal system: Abdomen is nondistended, soft and nontender. No organomegaly or masses felt. Normal bowel sounds heard. Central nervous system: Alert and oriented. No focal neurological deficits. Extremities: Symmetric 5 x 5 power. Skin: No rashes, lesions or ulcers Psychiatry: Judgement and insight appear normal. Mood & affect appropriate.    Data Reviewed:  Reviewed CT scan results, lab results.  Family Communication: None  Disposition: Status is: Inpatient Remains inpatient appropriate because: severity of disease, IV treatment.     Time spent: 35 minutes  Author: Marrion Coy, MD 03/22/2023 1:12 PM  For on call review [email or web address].

## 2023-03-22 NOTE — Progress Notes (Signed)
       CROSS COVER NOTE  NAME: Tricia Berry MRN: [id number] DOB : May 29, 1971    Concern from nurse Eyvonne Mechanic   pt here positive parainfluenza. A&Ox4 and ambulatory in room . refused lovenox. explained need and pt states she walks during the day. just fyi bc a red med. melissa      Pertinent findings on chart review: History of breast cancer on chemo and interstitial lung disease admitted with viral CAP and respiratory failure    03/22/2023    7:37 PM 03/22/2023    5:47 PM 03/22/2023    7:41 AM  Vitals with BMI  Systolic 101 106 782  Diastolic 66 68 77  Pulse 60 58 59     Assessment and  Interventions   Assessment: Patient declines Lovenox and SCDs.  States she is feeling better and has been moving around.  She used to be an ICU nurse and aware that she is at high risk for blood clots and given that she is well enough to move around, she opts to decline both Lovenox and SCDs  Plan: Patient declining DVT prophylaxis and aware of risks  X X

## 2023-03-22 NOTE — Hospital Course (Signed)
Tricia Berry is a 52 y.o. 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  CT angiogram of the chest rule out PE, showed significant proximal groundglass consolidation in upper lobes.  Patient started on Rocephin and Zithromax and Solu-Medrol.

## 2023-03-23 ENCOUNTER — Telehealth: Payer: Self-pay | Admitting: Family

## 2023-03-23 ENCOUNTER — Other Ambulatory Visit: Payer: Self-pay

## 2023-03-23 DIAGNOSIS — J9621 Acute and chronic respiratory failure with hypoxia: Secondary | ICD-10-CM | POA: Diagnosis not present

## 2023-03-23 DIAGNOSIS — J122 Parainfluenza virus pneumonia: Secondary | ICD-10-CM | POA: Insufficient documentation

## 2023-03-23 DIAGNOSIS — N182 Chronic kidney disease, stage 2 (mild): Secondary | ICD-10-CM | POA: Insufficient documentation

## 2023-03-23 LAB — CULTURE, BLOOD (ROUTINE X 2): Culture: NO GROWTH

## 2023-03-23 MED ORDER — CEPHALEXIN 500 MG PO CAPS
500.0000 mg | ORAL_CAPSULE | Freq: Three times a day (TID) | ORAL | 0 refills | Status: AC
  Filled 2023-03-23: qty 9, 3d supply, fill #0

## 2023-03-23 MED FILL — Nicotine TD Patch 24HR 21 MG/24HR: TRANSDERMAL | 28 days supply | Qty: 28 | Fill #0 | Status: CN

## 2023-03-23 NOTE — Discharge Summary (Signed)
Physician Discharge Summary   Patient: Tricia Berry MRN: [id number] DOB: 1971-05-07  Admit date:     03/21/2023  Discharge date: 03/23/23  Discharge Physician: Marrion Coy   PCP: Allegra Grana, FNP   Recommendations at discharge:   Follow-up with PCP in 1 week. Follow-up with pulmonology in 2 weeks.  Discharge Diagnoses: Principal Problem:   CAP (community acquired pneumonia) Active Problems:   Pneumonitis   Malignant neoplasm of left female breast (HCC)   Acute on chronic respiratory failure with hypoxia (HCC)   Chronic low back pain (1ry area of Pain) (Bilateral) (R>L) w/o sciatica   ILD (interstitial lung disease) (HCC)   Parainfluenza virus bronchopneumonia   CKD (chronic kidney disease) stage 2, GFR 60-89 ml/min  Resolved Problems:   * No resolved hospital problems. *  Hospital Course:  Tricia Berry is a 52 y.o. 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  CT angiogram of the chest rule out PE, showed significant proximal groundglass consolidation in upper lobes.  Patient started on Rocephin and Zithromax and Solu-Medrol. Patient condition has improved today, respiratory viral panel positive for parainfluenza.  Discussed with pulmonology, patient can be discharged home today resume 20 mg of prednisone daily.  I also put patient on additional 3 days of Keflex.  Assessment and Plan: *Upper lobe pneumonitis. Acute respiratory failure with hypoxia This appears due to parainfluenza virus.  Patient feels much better, she has  oxygen at home at night, she was also using as needed at home at the daytime.  At this time, she has 100% saturation on 2 L, does not anticipate using oxygen at this time.  Medically stable to be discharged.  Malignant neoplasm of left female breast Tricia Berry) Follow-up with oncology as outpatient.   Chronic low back pain (1ry area of Pain) (Bilateral) (R>L) w/o sciatica Continue Lyrica and oxycodone        Consultants: Pulmonology. Procedures performed: None  Disposition: Home Diet recommendation:  Discharge Diet Orders (From admission, onward)     Start     Ordered   03/23/23 0000  Diet - low sodium heart healthy        03/23/23 1010           Cardiac diet DISCHARGE MEDICATION: Allergies as of 03/23/2023       Reactions   Morphine Nausea And Vomiting, Other (See Comments)   migranes Other reaction(s): Headache Migraine, vomiting   Sertraline Hcl    REACTION: Worsened symptoms of IBS   Sulfa Antibiotics Rash   Sulfamethoxazole Rash   Sulfonamide Derivatives Rash        Medication List     STOP taking these medications    cholecalciferol 25 MCG (1000 UNIT) tablet Commonly known as: VITAMIN D3   MULTIVITAL PO       TAKE these medications    acetaminophen 650 MG CR tablet Commonly known as: TYLENOL Take 1,300 mg by mouth every 8 (eight) hours as needed for pain.   albuterol (2.5 MG/3ML) 0.083% nebulizer solution Commonly known as: PROVENTIL Take 3 mLs (2.5 mg total) by nebulization every 6 (six) hours as needed for  wheezing or shortness of breath.   albuterol 108 (90 Base) MCG/ACT inhaler Commonly known as: VENTOLIN HFA Inhale 2 puffs into the lungs every 6 (six) hours as needed for wheezing or shortness of breath.   anastrozole 1 MG tablet Commonly known as: ARIMIDEX Take 1 tablet (1 mg total) by mouth daily.   Anoro Ellipta 62.5-25 MCG/ACT Aepb Generic drug: umeclidinium-vilanterol Inhale 1 puff into the lungs daily.   atovaquone 750 MG/5ML  suspension Commonly known as: MEPRON Take 10 mLs (1,500 mg total) by mouth daily.   b complex vitamins capsule Take 1 capsule by mouth daily.   baclofen 10 MG tablet Commonly known as: LIORESAL Take 1 tablet (10 mg total) by mouth 3 (three) times daily as needed for muscle spasms.   calcium-vitamin D 500-5 MG-MCG tablet Commonly known as: OSCAL WITH D Take 1 tablet by mouth daily with breakfast.   cephALEXin 500 MG capsule Commonly known as: KEFLEX Take 1 capsule (500 mg total) by mouth 3 (three) times daily for 3 days.   dextromethorphan-guaiFENesin 10-100 MG/5ML liquid Commonly known as: ROBITUSSIN-DM Take 10 mLs by mouth every 4 (four) hours as needed for cough.   docusate sodium 100 MG capsule Commonly known as: COLACE Take 100 mg by mouth daily as needed for mild constipation.   FLUoxetine 20 MG capsule Commonly known as: PROZAC Take 1 capsule (20 mg total) by mouth every morning.   FreeStyle Freedom Lite w/Device Kit Test in the morning, at noon, and at bedtime.   gentamicin ointment 0.1 % Commonly known as: GARAMYCIN Apply to nose 3 (three) times daily.   gentamicin ointment 0.1 % Commonly known as: GARAMYCIN Apply 1 Application topically to nose 3 (three) times daily.   ibuprofen 400 MG tablet Commonly known as: ADVIL Take 400 mg by mouth every 6 (six) hours as needed.   LORazepam 0.5 MG tablet Commonly known as: ATIVAN Take 1 tablet (0.5 mg total) by mouth 2 (two) times daily as needed for anxiety.   Myrbetriq 50 MG Tb24 tablet Generic drug: mirabegron ER Take 1 tablet (50 mg total) by mouth daily.   naloxone 4 MG/0.1ML Liqd nasal spray kit Commonly known as: NARCAN Place 1 spray into the nose as needed for opioid-induced respiratory depresssion. In case of emergency (overdose), spray once into each nostril. If no response within 3 minutes, repeat application and call 911.   nicotine 21 mg/24hr patch Commonly known as: NICODERM CQ - dosed in mg/24  hours Apply one patch to skin once daily (Place 1 patch (21 mg total) onto the skin daily.)   oxyCODONE 5 MG immediate release tablet Commonly known as: Oxy IR/ROXICODONE Take 1 tablet (5 mg total) by mouth every 4 (four) hours as needed for pain. What changed: Another medication with the same name was removed. Continue taking this medication, and follow the directions you see here.   predniSONE 20 MG tablet Commonly known as: DELTASONE Take 1 tablet (20 mg total) by mouth daily.   pregabalin 150 MG capsule Commonly known as: LYRICA Take 1 capsule (150 mg total) by mouth in the morning, at noon, and at bedtime.   PROBIOTIC ACIDOPHILUS PO Take 1 capsule by mouth daily.   rosuvastatin 5 MG tablet Commonly known as: CRESTOR Take 1 tablet (5 mg total) by mouth daily.   trimethoprim 100 MG tablet Commonly known as: TRIMPEX Take 1 tablet (100 mg total) by mouth daily.   vitamin B-12 100 MCG tablet Commonly known as: CYANOCOBALAMIN Take 100 mcg  by mouth daily. gummy   cyanocobalamin 1000 MCG/ML injection Commonly known as: VITAMIN B12 Inject 1 mL into the muscle once monthly (Inject 1 mL (1,000 mcg total) into the muscle every 30 (thirty) days.)        Follow-up Information     Allegra Grana, FNP Follow up in 1 week(s).   Specialty: Family Medicine Contact information: [address] [place] [address] [place] [address] [phone]         Raechel Chute, MD Follow up in 2 week(s).   Specialty: Pulmonary Disease Contact information: [address] [place] [address] [phone]                Discharge Exam: Ceasar Mons Weights   03/21/23 1307  Weight: 61.7 kg   General exam: Appears calm and comfortable  Respiratory system: Clear to auscultation. Respiratory effort normal. Cardiovascular system: S1 & S2 heard, RRR. No JVD, murmurs, rubs, gallops or clicks. No pedal edema. Gastrointestinal system: Abdomen is nondistended, soft and  nontender. No organomegaly or masses felt. Normal bowel sounds heard. Central nervous system: Alert and oriented. No focal neurological deficits. Extremities: Symmetric 5 x 5 power. Skin: No rashes, lesions or ulcers Psychiatry: Judgement and insight appear normal. Mood & affect appropriate.    Condition at discharge: good  The results of significant diagnostics from this hospitalization (including imaging, microbiology, ancillary and laboratory) are listed below for reference.   Imaging Studies: CT Angio Chest PE W/Cm &/Or Wo Cm  Result Date: 03/21/2023 CLINICAL DATA:  Pulmonary embolism (PE) suspected, high prob. Pt states SOB, wheezing and non productive cough worsening over past week. EXAM: CT ANGIOGRAPHY CHEST WITH CONTRAST TECHNIQUE: Multidetector CT imaging of the chest was performed using the standard protocol during bolus administration of intravenous contrast. Multiplanar CT image reconstructions and MIPs were obtained to evaluate the vascular anatomy. RADIATION DOSE REDUCTION: This exam was performed according to the departmental dose-optimization program which includes automated exposure control, adjustment of the mA and/or kV according to patient size and/or use of iterative reconstruction technique. CONTRAST:  75mL OMNIPAQUE IOHEXOL 350 MG/ML SOLN COMPARISON:  CT chest 01/11/2023 trauma CT angio chest 08/30/2022, PET CT 04/18/2021 FINDINGS: Cardiovascular: Satisfactory opacification of the pulmonary arteries to the segmental level. No evidence of pulmonary embolism. Normal heart size. No significant pericardial effusion. The thoracic aorta is normal in caliber. No atherosclerotic plaque of the thoracic aorta. No coronary artery calcifications. Mediastinum/Nodes: Stable enlarged precarinal 1.2 cm lymph node. Status post left axillary lymph node dissection. No enlarged mediastinal, hilar, or axillary lymph nodes. Thyroid gland, trachea, and esophagus demonstrate no significant findings.  Lungs/Pleura: Interval worsening of peribronchovascular ground-glass and consolidative airspace opacities most prominent in the right upper lobe. Persistent underlying mild bronchiectasis and bronchial wall thickening. No pulmonary nodule. No pulmonary mass. No pleural effusion. No pneumothorax. Upper Abdomen: No acute abnormality. Musculoskeletal: Bilateral breast implants intact. Persistent vague asymmetry of the left lateral chest wall serratus anterior musculature (6:211). No suspicious lytic or blastic osseous lesions. No acute displaced fracture. Review of the MIP images confirms the above findings. IMPRESSION: 1. No pulmonary embolus. 2. Interval worsening of peribronchovascular ground-glass and consolidative airspace opacities most prominent in the right upper lobe. Persistent underlying mild bronchiectasis and bronchial wall thickening. Findings may represent infection versus inflammation. 3. Stable enlarged precarinal 1.2 cm lymph node. 4. Persistent vague asymmetry of the left lateral chest wall serratus anterior musculature. Finding may be postsurgical. Recommend attention on follow-up Electronically Signed   By: Blanchie Serve  Tessie Fass M.D.   On: 03/21/2023 19:06   DG Chest 2 View  Result Date: 03/21/2023 CLINICAL DATA:  Shortness of breath EXAM: CHEST - 2 VIEW COMPARISON:  11/21/2022 and older.  CT 01/11/2023 FINDINGS: No pneumothorax, effusion or edema. Normal cardiopericardial silhouette. Slight interstitial and parenchymal opacities are again seen but decreased from previous examination. Curvature of the spine.  Left axillary surgical clips. IMPRESSION: Subtle patchy and interstitial changes of the lungs but decreased from the prior examination. Please correlate with history recommend continued follow-up. Electronically Signed   By: Karen Kays M.D.   On: 03/21/2023 13:48    Microbiology: Results for orders placed or performed during the hospital encounter of 03/21/23  Blood culture (routine x 2)      Status: None (Preliminary result)   Collection Time: 03/21/23  8:17 PM   Specimen: BLOOD  Result Value Ref Range Status   Specimen Description BLOOD BLOOD RIGHT ARM  Final   Special Requests   Final    BOTTLES DRAWN AEROBIC AND ANAEROBIC Blood Culture results may not be optimal due to an inadequate volume of blood received in culture bottles   Culture   Final    NO GROWTH 2 DAYS Performed at [facility], [address], [place], [place] [address]    Report Status PENDING  Incomplete  Blood culture (routine x 2)     Status: None (Preliminary result)   Collection Time: 03/21/23  8:17 PM   Specimen: BLOOD  Result Value Ref Range Status   Specimen Description BLOOD BLOOD RIGHT HAND  Final   Special Requests   Final    BOTTLES DRAWN AEROBIC AND ANAEROBIC Blood Culture adequate volume   Culture   Final    NO GROWTH 2 DAYS Performed at [facility], [address] [place], [place] [address]    Report Status PENDING  Incomplete  Respiratory (~20 pathogens) panel by PCR     Status: Abnormal   Collection Time: 03/22/23  9:00 AM   Specimen: Nasopharyngeal Swab; Respiratory  Result Value Ref Range Status   Adenovirus NOT DETECTED NOT DETECTED Final   Coronavirus 229E NOT DETECTED NOT DETECTED Final    Comment: (NOTE) The Coronavirus on the Respiratory Panel, DOES NOT test for the novel  Coronavirus (2019 nCoV)    Coronavirus HKU1 NOT DETECTED NOT DETECTED Final   Coronavirus NL63 NOT DETECTED NOT DETECTED Final   Coronavirus OC43 NOT DETECTED NOT DETECTED Final   Metapneumovirus NOT DETECTED NOT DETECTED Final   Rhinovirus / Enterovirus NOT DETECTED NOT DETECTED Final   Influenza A NOT DETECTED NOT DETECTED Final   Influenza B NOT DETECTED NOT DETECTED Final   Parainfluenza Virus 1 NOT DETECTED NOT DETECTED Final   Parainfluenza Virus 2 NOT DETECTED NOT DETECTED Final   Parainfluenza Virus 3 DETECTED (A) NOT DETECTED Final   Parainfluenza Virus 4 NOT DETECTED  NOT DETECTED Final   Respiratory Syncytial Virus NOT DETECTED NOT DETECTED Final   Bordetella pertussis NOT DETECTED NOT DETECTED Final   Bordetella Parapertussis NOT DETECTED NOT DETECTED Final   Chlamydophila pneumoniae NOT DETECTED NOT DETECTED Final   Mycoplasma pneumoniae NOT DETECTED NOT DETECTED Final    Comment: Performed at [facility], [address] [place], [place] [address]  SARS Coronavirus 2 by RT PCR (hospital order, performed in [facility] hospital lab) *cepheid single result test* Anterior Nasal Swab     Status: None   Collection Time: 03/22/23  1:32 PM   Specimen:  Anterior Nasal Swab  Result Value Ref Range Status   SARS Coronavirus 2 by RT PCR NEGATIVE NEGATIVE Final    Comment: (NOTE) SARS-CoV-2 target nucleic acids are NOT DETECTED.  The SARS-CoV-2 RNA is generally detectable in upper and lower respiratory specimens during the acute phase of infection. The lowest concentration of SARS-CoV-2 viral copies this assay can detect is 250 copies / mL. A negative result does not preclude SARS-CoV-2 infection and should not be used as the sole basis for treatment or other patient management decisions.  A negative result may occur with improper specimen collection / handling, submission of specimen other than nasopharyngeal swab, presence of viral mutation(s) within the areas targeted by this assay, and inadequate number of viral copies (<250 copies / mL). A negative result must be combined with clinical observations, patient history, and epidemiological information.  Fact Sheet for Patients:   RoadLapTop.co.za  Fact Sheet for Healthcare Providers: http://kim-miller.com/  This test is not yet approved or  cleared by the [place] FDA and has been authorized for detection and/or diagnosis of SARS-CoV-2 by FDA under an Emergency Use Authorization (EUA).  This EUA will remain in effect (meaning this test can be  used) for the duration of the COVID-19 declaration under Section 564(b)(1) of the Act, 21 U.S.C. section 360bbb-3(b)(1), unless the authorization is terminated or revoked sooner.  Performed at [facility], [address] [place], [place] [address]    *Note: Due to a large number of results and/or encounters for the requested time period, some results have not been displayed. A complete set of results can be found in Results Review.    Labs: CBC: Recent Labs  Lab 03/21/23 1514  WBC 6.4  NEUTROABS 5.1  HGB 12.4  HCT 39.2  MCV 92.5  PLT 179   Basic Metabolic Panel: Recent Labs  Lab 03/21/23 1514  NA 139  K 4.1  CL 100  CO2 28  GLUCOSE 147*  BUN 9  CREATININE 1.15*  CALCIUM 9.0   Liver Function Tests: Recent Labs  Lab 03/21/23 1514  AST 25  ALT 22  ALKPHOS 69  BILITOT 0.4  PROT 7.1  ALBUMIN 3.4*   CBG: No results for input(s): "GLUCAP" in the last 168 hours.  Discharge time spent: greater than 30 minutes.  Signed: Marrion Coy, MD [facility] 03/23/2023

## 2023-03-23 NOTE — Telephone Encounter (Signed)
Hospital follow up . Called from 2 C . Patient scheduled for 04/01/22 @ 11:30.

## 2023-03-23 NOTE — TOC CM/SW Note (Signed)
Transition of Care [facility]) - Inpatient Brief Assessment   Patient Details  Name: Tricia Berry MRN: [id number] Date of Birth: 03/23/1971  Transition of Care [facility]) CM/SW Contact:    Chapman Fitch, RN Phone Number: 03/23/2023, 10:56 AM   Clinical Narrative:    Transition of Care Asessment: Insurance and Status: Insurance coverage has been reviewed Patient has primary care physician: Yes   Prior level of function:: independent Prior/Current Home Services: No current home services Social Determinants of Health Reivew: SDOH reviewed no interventions necessary Readmission risk has been reviewed: Yes Transition of care needs: no transition of care needs at this time

## 2023-03-23 NOTE — Plan of Care (Signed)
°  Problem: Education: °Goal: Knowledge of disease or condition will improve °Outcome: Adequate for Discharge °Goal: Knowledge of the prescribed therapeutic regimen will improve °Outcome: Adequate for Discharge °Goal: Individualized Educational Video(s) °Outcome: Adequate for Discharge °  °Problem: Activity: °Goal: Ability to tolerate increased activity will improve °Outcome: Adequate for Discharge °Goal: Will verbalize the importance of balancing activity with adequate rest periods °Outcome: Adequate for Discharge °  °Problem: Respiratory: °Goal: Ability to maintain a clear airway will improve °Outcome: Adequate for Discharge °Goal: Levels of oxygenation will improve °Outcome: Adequate for Discharge °Goal: Ability to maintain adequate ventilation will improve °Outcome: Adequate for Discharge °  °Problem: Activity: °Goal: Ability to tolerate increased activity will improve °Outcome: Adequate for Discharge °  °Problem: Clinical Measurements: °Goal: Ability to maintain a body temperature in the normal range will improve °Outcome: Adequate for Discharge °  °Problem: Respiratory: °Goal: Ability to maintain adequate ventilation will improve °Outcome: Adequate for Discharge °Goal: Ability to maintain a clear airway will improve °Outcome: Adequate for Discharge °  °Problem: Education: °Goal: Knowledge of General Education information will improve °Description: Including pain rating scale, medication(s)/side effects and non-pharmacologic comfort measures °Outcome: Adequate for Discharge °  °Problem: Health Behavior/Discharge Planning: °Goal: Ability to manage health-related needs will improve °Outcome: Adequate for Discharge °  °Problem: Clinical Measurements: °Goal: Ability to maintain clinical measurements within normal limits will improve °Outcome: Adequate for Discharge °Goal: Will remain free from infection °Outcome: Adequate for Discharge °Goal: Diagnostic test results will improve °Outcome: Adequate for  Discharge °Goal: Respiratory complications will improve °Outcome: Adequate for Discharge °Goal: Cardiovascular complication will be avoided °Outcome: Adequate for Discharge °  °Problem: Activity: °Goal: Risk for activity intolerance will decrease °Outcome: Adequate for Discharge °  °Problem: Nutrition: °Goal: Adequate nutrition will be maintained °Outcome: Adequate for Discharge °  °Problem: Coping: °Goal: Level of anxiety will decrease °Outcome: Adequate for Discharge °  °Problem: Elimination: °Goal: Will not experience complications related to bowel motility °Outcome: Adequate for Discharge °Goal: Will not experience complications related to urinary retention °Outcome: Adequate for Discharge °  °Problem: Pain Managment: °Goal: General experience of comfort will improve °Outcome: Adequate for Discharge °  °Problem: Safety: °Goal: Ability to remain free from injury will improve °Outcome: Adequate for Discharge °  °Problem: Skin Integrity: °Goal: Risk for impaired skin integrity will decrease °Outcome: Adequate for Discharge °  °

## 2023-03-23 NOTE — Progress Notes (Signed)
AVS given and explained to patient, awaiting for husband to bring patient portable O2 tank.

## 2023-03-24 LAB — CULTURE, BLOOD (ROUTINE X 2): Culture: NO GROWTH

## 2023-03-25 LAB — CULTURE, BLOOD (ROUTINE X 2)

## 2023-03-26 ENCOUNTER — Telehealth: Payer: Self-pay

## 2023-03-26 ENCOUNTER — Other Ambulatory Visit: Payer: Self-pay

## 2023-03-26 LAB — CULTURE, BLOOD (ROUTINE X 2)

## 2023-03-26 NOTE — Telephone Encounter (Signed)
NOTED

## 2023-03-26 NOTE — Transitions of Care (Post Inpatient/ED Visit) (Signed)
03/26/2023  Name: Tricia Berry MRN: [id number] DOB: 07/12/1971  Today's TOC FU Call Status: Today's TOC FU Call Status:: Successful TOC FU Call Competed TOC FU Call Complete Date: 03/26/23  Transition Care Management Follow-up Telephone Call Date of Discharge: 03/21/23 Discharge Facility: [facility]) Type of Discharge: Inpatient Admission Primary Inpatient Discharge Diagnosis:: hypoxia Reason for ED Visit: Other: How have you been since you were released from the hospital?: Same Any questions or concerns?: No  Items Reviewed: Did you receive and understand the discharge instructions provided?: Yes Medications obtained,verified, and reconciled?: Yes (Medications Reviewed) Any new allergies since your discharge?: No Dietary orders reviewed?: NA Do you have support at home?: Yes People in Home: friend(s)  Medications Reviewed Today: Medications Reviewed Today     Reviewed by Annabell Sabal, [name] (Certified Medical Assistant) on 03/26/23 at 1132  Med List Status: <None>   Medication Order Taking? Sig Documenting Provider Last Dose Status Informant  acetaminophen (TYLENOL) 650 MG CR tablet [id number] Yes Take 1,300 mg by mouth every 8 (eight) hours as needed for pain. [provider] Taking Active Self, Pharmacy Records  albuterol (PROVENTIL) (2.5 MG/3ML) 0.083% nebulizer solution [id number] Yes Take 3 mLs (2.5 mg total) by nebulization every 6 (six) hours as needed for wheezing or shortness of breath. Alford Highland, MD Taking Active Self, Pharmacy Records  albuterol (VENTOLIN HFA) 108 (90 Base) MCG/ACT inhaler [id number] Yes Inhale 2 puffs into the lungs every 6 (six) hours as needed for wheezing or shortness of breath. Allegra Grana, FNP Taking Active Self, Pharmacy Records  anastrozole (ARIMIDEX) 1 MG tablet [id number] Yes Take 1 tablet (1 mg total) by mouth daily. Creig Hines, MD Taking Active Self, Pharmacy Records  at[facility]qu[facility]) 750 MG/5ML  suspension [id number] Yes Take 10 mLs (1,500 mg total) by mouth daily. Raechel Chute, MD Taking Active Self, Pharmacy Records  b complex vitamins capsule [id number] Yes Take 1 capsule by mouth daily. [provider] Taking Active Self, Pharmacy Records  baclofen (LIORESAL) 10 MG tablet [id number] Yes Take 1 tablet (10 mg total) by mouth 3 (three) times daily as needed for muscle spasms. Henreitta Leber, MD Taking Active Self, Pharmacy Records  Blood Glucose Monitoring Suppl (BLOOD GLUCOSE MONITOR SYSTEM) w/Device KIT [id number] Yes Test in the morning, at noon, and at bedtime. Allegra Grana, FNP Taking Active Self, Pharmacy Records  calcium-vitamin D Ruthell Rummage WITH D) 500-5 MG-MCG tablet [id number] Yes Take 1 tablet by mouth daily with breakfast. [provider] Taking Active Self, Pharmacy Records  cephALEXin (KEFLEX) 500 MG capsule [id number] Yes Take 1 capsule (500 mg total) by mouth 3 (three) times daily for 3 days. Marrion Coy, MD Taking Active   cyanocobalamin (VITAMIN B12) 1000 MCG/ML injection [id number] Yes Inject 1 mL (1,000 mcg total) into the muscle every 30 (thirty) days. Allegra Grana, FNP Taking Active Self, Pharmacy Records  dextromethorphan-guaiFENesin (ROBITUSSIN-DM) 10-100 MG/5ML liquid [id number] Yes Take 10 mLs by mouth every 4 (four) hours as needed for cough. [provider] Taking Active Self, Pharmacy Records  docusate sodium (COLACE) 100 MG capsule [id number] Yes Take 100 mg by mouth daily as needed for mild constipation. [provider] Taking Active Self, Pharmacy Records  FLUoxetine (PROZAC) 20 MG capsule [id number] Yes Take 1 capsule (20 mg total) by mouth every morning. Allegra Grana, FNP Taking Active Self, Pharmacy Records  gentamicin ointment (GARAMYCIN) 0.1 % [id number] Yes Apply to nose 3 (three) times daily.  Taking Active Self, Pharmacy Records  gentamicin ointment (GARAMYCIN) 0.1 % [id number] Yes Apply 1 Application topically  to nose 3 (three) times daily.  Taking Active Self, Pharmacy Records  ibuprofen (ADVIL) 400 MG tablet [id number] Yes Take 400 mg by mouth every 6 (six) hours as needed. [provider] Taking Active Self, Pharmacy Records  Lactobacillus (PROBIOTIC ACIDOPHILUS PO) [id number] Yes Take 1 capsule by mouth daily. [provider] Taking Active Self, Pharmacy Records  LORazepam (ATIVAN) 0.5 MG tablet [id number] Yes Take 1 tablet (0.5 mg total) by mouth 2 (two) times daily as needed for anxiety. Allegra Grana, FNP Taking Active Self, Pharmacy Records  mirabegron ER [facility]) 50 MG TB24 tablet [id number] Yes Take 1 tablet (50 mg total) by mouth daily. Carman Ching, PA-C Taking Active Self, Pharmacy Records  naloxone [facility]) nasal spray 4 mg/0.1 mL [id number] Yes Place 1 spray into the nose as needed for opioid-induced respiratory depresssion. In case of emergency (overdose), spray once into each nostril. If no response within 3 minutes, repeat application and call 911. Delano Metz, MD Taking Active Self, Pharmacy Records  nicotine (NICODERM CQ - DOSED IN MG/24 HOURS) 21 mg/24hr patch [id number] Yes Place 1 patch (21 mg total) onto the skin daily. Allegra Grana, FNP Taking Active   oxyCODONE (OXY IR/ROXICODONE) 5 MG immediate release tablet [id number] Yes Take 1 tablet (5 mg total) by mouth every 4 (four) hours as needed for pain.  Taking Active Self, Pharmacy Records           Med Note Lawerance Sabal Mar 22, 2023  7:30 PM) Patient established care at [facility] with Dr Thyra Breed. No longer with Dr Laban Emperor at [facility]. Dr Shireen Quan is aware.  predniSONE (DELTASONE) 20 MG tablet [id number] Yes Take 1 tablet (20 mg total) by mouth daily. Raechel Chute, MD Taking Active Self, Pharmacy Records  pregabalin (LYRICA) 150 MG capsule [id number] Yes Take 1 capsule (150 mg total) by mouth in the morning, at noon, and at bedtime. Allegra Grana, FNP Taking Active Self, Pharmacy Records  rosuvastatin (CRESTOR) 5 MG tablet [id number] Yes Take 1 tablet (5 mg total) by mouth daily. Allegra Grana, FNP Taking Active Self, Pharmacy Records  trimethoprim (TRIMPEX) 100 MG tablet [id number] Yes Take 1 tablet (100 mg total) by mouth daily. Vanna Scotland, MD Taking Active Self, Pharmacy Records  umeclidinium-vilanterol [facility] ELLIPTA) 62.5-25 MCG/ACT AEPB [id number] Yes Inhale 1 puff into the lungs daily. Raechel Chute, MD Taking Active Self, Pharmacy Records  vitamin B-12 (CYANOCOBALAMIN) 100 MCG tablet [id number] Yes Take 100 mcg by mouth daily. gummy [provider] Taking Active Self, Pharmacy Records  Med List Note Earlyne Iba, RN 11/15/22 1118): UDS 05/29/22; MR 12/15/22 Verzenio filled at [facility] Medication agreement signed 07/05/22 LP            Home Care and Equipment/Supplies: Were Home Health Services Ordered?: No Any new equipment or medical supplies ordered?: No  Functional Questionnaire: Do you need assistance with bathing/showering or dressing?: No Do you need assistance with meal preparation?: No Do you need assistance with eating?: No Do you have difficulty maintaining continence: No Do you need assistance with getting out of bed/getting out of a chair/moving?: No Do you have difficulty managing or taking your medications?: No  Follow up appointments reviewed: PCP Follow-up appointment confirmed?: Yes Date of PCP follow-up appointment?: 04/02/23 Follow-up Provider: [facility] Follow-up appointment confirmed?: NA Do you need transportation to your follow-up appointment?: No  Do you understand care options if your condition(s) worsen?: Yes-patient verbalized understanding    SIGNATURE Fredirick Maudlin

## 2023-03-28 LAB — PNEUMOCYSTIS SMEAR, DFA: Pneumocystis Smear, DFA: NEGATIVE

## 2023-03-29 ENCOUNTER — Inpatient Hospital Stay: Payer: Commercial Managed Care - HMO | Attending: Oncology

## 2023-03-29 DIAGNOSIS — C50212 Malignant neoplasm of upper-inner quadrant of left female breast: Secondary | ICD-10-CM | POA: Diagnosis present

## 2023-03-29 DIAGNOSIS — Z5111 Encounter for antineoplastic chemotherapy: Secondary | ICD-10-CM | POA: Insufficient documentation

## 2023-03-29 DIAGNOSIS — Z79899 Other long term (current) drug therapy: Secondary | ICD-10-CM | POA: Diagnosis not present

## 2023-03-29 MED ORDER — GOSERELIN ACETATE 3.6 MG ~~LOC~~ IMPL
3.6000 mg | DRUG_IMPLANT | SUBCUTANEOUS | Status: DC
  Administered 2023-03-29: 3.6 mg via SUBCUTANEOUS
  Filled 2023-03-29: qty 3.6

## 2023-04-02 ENCOUNTER — Ambulatory Visit: Payer: Commercial Managed Care - HMO | Admitting: Family

## 2023-04-02 ENCOUNTER — Encounter: Payer: Self-pay | Admitting: Family

## 2023-04-02 VITALS — BP 118/76 | HR 89 | Temp 98.3°F | Ht 60.0 in | Wt 132.8 lb

## 2023-04-02 DIAGNOSIS — B351 Tinea unguium: Secondary | ICD-10-CM | POA: Diagnosis not present

## 2023-04-02 DIAGNOSIS — J189 Pneumonia, unspecified organism: Secondary | ICD-10-CM | POA: Diagnosis not present

## 2023-04-02 NOTE — Progress Notes (Signed)
Assessment & Plan:  Onychomycosis Assessment & Plan: Presentation consistent with onychomycosis bilateral great toes.  I advised patient to trial over-the-counter Lamisil to start.  Discussed lengthy treatment with oral antifungal, serial monitoring LFTs.  We discussed at this time  priority is in regards to lung disease.  Offered podiatry appointment.  She politely declines at this time   Community acquired pneumonia, unspecified laterality Assessment & Plan: Reviewed hospital course with patient.  Medications reconciled.  Chronic cough, congestion, shortness of breath.  Symptoms overall at baseline.  She will continue prednisone 20 mg qd.   discussed using albuterol nebulizer more often and scheduled.  Encouraged her to use Mucinex -dextromethorphan to break up thick mucus, suppress cough.  She will continue to follow closely with pulmonology.  Will follow.  Of note, we discussed CT angiogram of chest with vague asymmetry left lateral chest wall.  Finding may be postsurgical.  I have sent a staff message Dr. Ulice Bold, Dr. Smith Robert in regards to further surveillance.      Return precautions given.   Risks, benefits, and alternatives of the medications and treatment plan prescribed today were discussed, and patient expressed understanding.   Education regarding symptom management and diagnosis given to patient on AVS either electronically or printed.  Return in about 3 months (around 07/03/2023).  Rennie Plowman, FNP  Subjective:    Patient ID: Tricia Berry, Tricia Berry    DOB: 08-Sep-1971, 52 y.o.   MRN: [id number]  CC: Tricia Berry is a 52 y.o. Tricia Berry who presents today for follow up.   HPI: Cough is worse when laying down, talking.  SOB  improved compared to presentation in the emergency room  She has productive cough. SOB at baseline.  She is compliant with prednisone 20 mg daily  Using nebulizer prn.   Complains of discoloration, 'white half moon' shape close to  cuticle to right great toe x 6 months  The cuticle is painful. No purulent discharge, black streak.       Admitted 03/20/22 and discharged 03/13/23 Presents emergency room shortness of breath Admitted for community-acquired pneumonia, acute on chronic respiratory failure with hypoxia Consult during hospitalization Dr Elgie Collard, consider thoracic surgery VATS wedge biopsy  Hospital course significant for CT angiogram of the chest with significant proximal groundglass consolidation in right upper lobe.  No pulmonary embolism.  No atherosclerotic plaque of the thoracic aorta .  Stable enlarged precarinal 1.2cm lymph node.  Persistent vague asymmetry left lateral chest wall.  Finding may be postsurgical   parainfluenza detected  Blood culture negative. started on Rocephin Zithromax, Solu-Medrol.  Discharged on prednisone 20 mg daily, keflex 500mg  TID x 3 days BNP 57 Crt 1.15 WBC 6.4  She is compliant with 2L O2.  She is compliant with anoro Follow-up pulmonology 04/16/2023, Dr Elgie Collard  CT chest 01/11/2023 of note she has no focal abnormality musculoskeletal   Allergies: Morphine, Sertraline hcl, Sulfa antibiotics, Sulfamethoxazole, and Sulfonamide derivatives Current Outpatient Medications on File Prior to Visit  Medication Sig Dispense Refill   acetaminophen (TYLENOL) 650 MG CR tablet Take 1,300 mg by mouth every 8 (eight) hours as needed for pain.     albuterol (PROVENTIL) (2.5 MG/3ML) 0.083% nebulizer solution Take 3 mLs (2.5 mg total) by nebulization every 6 (six) hours as needed for wheezing or shortness of breath. 360 mL 12   albuterol (VENTOLIN HFA) 108 (90 Base) MCG/ACT inhaler Inhale 2 puffs into the lungs every 6 (six) hours as needed for wheezing or shortness of breath.  6.7 g 0   anastrozole (ARIMIDEX) 1 MG tablet Take 1 tablet (1 mg total) by mouth daily. 30 tablet 5   atovaquone (MEPRON) 750 MG/5ML suspension Take 10 mLs (1,500 mg total) by mouth daily. 300 mL 1   b complex  vitamins capsule Take 1 capsule by mouth daily.     baclofen (LIORESAL) 10 MG tablet Take 1 tablet (10 mg total) by mouth 3 (three) times daily as needed for muscle spasms. 60 tablet 2   Blood Glucose Monitoring Suppl (BLOOD GLUCOSE MONITOR SYSTEM) w/Device KIT Test in the morning, at noon, and at bedtime. 1 kit 0   calcium-vitamin D (OSCAL WITH D) 500-5 MG-MCG tablet Take 1 tablet by mouth daily with breakfast.     cyanocobalamin (VITAMIN B12) 1000 MCG/ML injection Inject 1 mL (1,000 mcg total) into the muscle every 30 (thirty) days. 3 mL 4   dextromethorphan-guaiFENesin (ROBITUSSIN-DM) 10-100 MG/5ML liquid Take 10 mLs by mouth every 4 (four) hours as needed for cough.     docusate sodium (COLACE) 100 MG capsule Take 100 mg by mouth daily as needed for mild constipation.     FLUoxetine (PROZAC) 20 MG capsule Take 1 capsule (20 mg total) by mouth every morning. 90 capsule 3   gentamicin ointment (GARAMYCIN) 0.1 % Apply to nose 3 (three) times daily. 30 g 12   gentamicin ointment (GARAMYCIN) 0.1 % Apply 1 Application topically to nose 3 (three) times daily. 30 g 12   ibuprofen (ADVIL) 400 MG tablet Take 400 mg by mouth every 6 (six) hours as needed.     Lactobacillus (PROBIOTIC ACIDOPHILUS PO) Take 1 capsule by mouth daily.     LORazepam (ATIVAN) 0.5 MG tablet Take 1 tablet (0.5 mg total) by mouth 2 (two) times daily as needed for anxiety. 60 tablet 2   mirabegron ER (MYRBETRIQ) 50 MG TB24 tablet Take 1 tablet (50 mg total) by mouth daily. 30 tablet 1   naloxone (NARCAN) nasal spray 4 mg/0.1 mL Place 1 spray into the nose as needed for opioid-induced respiratory depresssion. In case of emergency (overdose), spray once into each nostril. If no response within 3 minutes, repeat application and call 911. 2 each 0   nicotine (NICODERM CQ - DOSED IN MG/24 HOURS) 21 mg/24hr patch Place 1 patch (21 mg total) onto the skin daily. 28 patch 1   oxyCODONE (OXY IR/ROXICODONE) 5 MG immediate release tablet Take 1  tablet (5 mg total) by mouth every 4 (four) hours as needed for pain. 120 tablet 0   predniSONE (DELTASONE) 20 MG tablet Take 1 tablet (20 mg total) by mouth daily. 30 tablet 0   pregabalin (LYRICA) 150 MG capsule Take 1 capsule (150 mg total) by mouth in the morning, at noon, and at bedtime. 90 capsule 2   rosuvastatin (CRESTOR) 5 MG tablet Take 1 tablet (5 mg total) by mouth daily. 90 tablet 0   trimethoprim (TRIMPEX) 100 MG tablet Take 1 tablet (100 mg total) by mouth daily. 90 tablet 3   umeclidinium-vilanterol (ANORO ELLIPTA) 62.5-25 MCG/ACT AEPB Inhale 1 puff into the lungs daily. 60 each 6   vitamin B-12 (CYANOCOBALAMIN) 100 MCG tablet Take 100 mcg by mouth daily. gummy     Current Facility-Administered Medications on File Prior to Visit  Medication Dose Route Frequency Provider Last Rate Last Admin   acetaminophen (TYLENOL) 325 MG tablet            diphenhydrAMINE (BENADRYL) 25 mg capsule  goserelin (ZOLADEX) injection 3.6 mg  3.6 mg Subcutaneous Q28 days Creig Hines, MD   3.6 mg at 02/02/23 1206   heparin lock flush 100 unit/mL  500 Units Intravenous Once Creig Hines, MD       Zoledronic Acid (ZOMETA) 4 MG/100ML IVPB             Review of Systems  Constitutional:  Negative for chills and fever.  HENT:  Positive for congestion.   Respiratory:  Positive for cough.   Cardiovascular:  Negative for chest pain and palpitations.  Gastrointestinal:  Negative for nausea and vomiting.      Objective:    BP 118/76   Pulse 89   Temp 98.3 F (36.8 C) (Oral)   Ht 5' (1.524 m)   Wt 132 lb 12.8 oz (60.2 kg)   SpO2 93%   BMI 25.94 kg/m  BP Readings from Last 3 Encounters:  04/02/23 118/76  03/23/23 112/73  03/02/23 112/75   Wt Readings from Last 3 Encounters:  04/02/23 132 lb 12.8 oz (60.2 kg)  03/21/23 136 lb (61.7 kg)  03/02/23 136 lb (61.7 kg)    Physical Exam Vitals reviewed.  Constitutional:      Appearance: She is well-developed.  Eyes:      Conjunctiva/sclera: Conjunctivae normal.  Cardiovascular:     Rate and Rhythm: Normal rate and regular rhythm.     Pulses: Normal pulses.     Heart sounds: Normal heart sounds.  Pulmonary:     Effort: Pulmonary effort is normal.     Breath sounds: Normal breath sounds. No wheezing, rhonchi or rales.  Skin:    General: Skin is warm and dry.  Neurological:     Mental Status: She is alert.  Psychiatric:        Speech: Speech normal.        Behavior: Behavior normal.        Thought Content: Thought content normal.

## 2023-04-03 ENCOUNTER — Other Ambulatory Visit: Payer: Self-pay

## 2023-04-04 DIAGNOSIS — B351 Tinea unguium: Secondary | ICD-10-CM | POA: Insufficient documentation

## 2023-04-04 NOTE — Assessment & Plan Note (Addendum)
Reviewed hospital course with patient.  Medications reconciled.  Chronic cough, congestion, shortness of breath.  Symptoms overall at baseline.  She will continue prednisone 20 mg qd.   discussed using albuterol nebulizer more often and scheduled.  Encouraged her to use Mucinex -dextromethorphan to break up thick mucus, suppress cough.  She will continue to follow closely with pulmonology.  Will follow.  Of note, we discussed CT angiogram of chest with vague asymmetry left lateral chest wall.  Finding may be postsurgical.  I have sent a staff message Dr. Ulice Bold, Dr. Smith Robert in regards to further surveillance.

## 2023-04-04 NOTE — Assessment & Plan Note (Signed)
Presentation consistent with onychomycosis bilateral great toes.  I advised patient to trial over-the-counter Lamisil to start.  Discussed lengthy treatment with oral antifungal, serial monitoring LFTs.  We discussed at this time  priority is in regards to lung disease.  Offered podiatry appointment.  She politely declines at this time

## 2023-04-04 NOTE — Patient Instructions (Addendum)
I have sent a message Dr. Smith Robert, Dr. Ulice Bold in regards to vague asymmetry left lateral chest wall.    If you have not heard from the in this regard in the next week, please let me know.    Please use albuterol nebulizer more often, scheduled.  You may use guaifenesin-dextromethorphan to break up thick mucus, cough suppression.  I suspect you have a fungal infection of the toenail bed.  I will try over-the-counter Lamisil.  Please let me know if you would like to have a consult with podiatry.

## 2023-04-11 ENCOUNTER — Other Ambulatory Visit: Payer: Self-pay

## 2023-04-11 ENCOUNTER — Other Ambulatory Visit: Payer: Self-pay | Admitting: Family

## 2023-04-11 DIAGNOSIS — M545 Low back pain, unspecified: Secondary | ICD-10-CM

## 2023-04-11 MED ORDER — OXYCODONE HCL 10 MG PO TABS
10.0000 mg | ORAL_TABLET | ORAL | 0 refills | Status: DC | PRN
  Filled 2023-04-11: qty 180, 30d supply, fill #0

## 2023-04-11 MED ORDER — PREGABALIN 150 MG PO CAPS
150.0000 mg | ORAL_CAPSULE | Freq: Three times a day (TID) | ORAL | 2 refills | Status: DC
Start: 2023-04-11 — End: 2023-08-21
  Filled 2023-04-11: qty 90, 30d supply, fill #0
  Filled 2023-06-13: qty 90, 30d supply, fill #1
  Filled 2023-07-17: qty 90, 30d supply, fill #2

## 2023-04-11 NOTE — Telephone Encounter (Signed)
Refilled: 12/29/2022 Last OV: 04/02/2023 Next OV: 05/02/2023

## 2023-04-16 ENCOUNTER — Inpatient Hospital Stay: Payer: Commercial Managed Care - HMO | Admitting: Student in an Organized Health Care Education/Training Program

## 2023-04-16 ENCOUNTER — Encounter: Payer: Self-pay | Admitting: Student in an Organized Health Care Education/Training Program

## 2023-04-19 ENCOUNTER — Encounter: Payer: Commercial Managed Care - HMO | Admitting: Surgical

## 2023-04-22 ENCOUNTER — Other Ambulatory Visit: Payer: Self-pay | Admitting: Family

## 2023-04-22 ENCOUNTER — Other Ambulatory Visit: Payer: Self-pay | Admitting: Internal Medicine

## 2023-04-22 ENCOUNTER — Other Ambulatory Visit: Payer: Self-pay | Admitting: Student in an Organized Health Care Education/Training Program

## 2023-04-22 ENCOUNTER — Other Ambulatory Visit: Payer: Self-pay

## 2023-04-22 DIAGNOSIS — J189 Pneumonia, unspecified organism: Secondary | ICD-10-CM

## 2023-04-22 DIAGNOSIS — F411 Generalized anxiety disorder: Secondary | ICD-10-CM

## 2023-04-23 ENCOUNTER — Encounter: Payer: Self-pay | Admitting: Oncology

## 2023-04-23 ENCOUNTER — Other Ambulatory Visit: Payer: Self-pay

## 2023-04-23 MED FILL — Atovaquone Susp 750 MG/5ML: ORAL | 30 days supply | Qty: 300 | Fill #0 | Status: AC

## 2023-04-23 MED FILL — Fluoxetine HCl Cap 20 MG: ORAL | 90 days supply | Qty: 90 | Fill #0 | Status: AC

## 2023-04-23 MED FILL — Baclofen Tab 10 MG: ORAL | 20 days supply | Qty: 60 | Fill #0 | Status: AC

## 2023-04-23 NOTE — Telephone Encounter (Signed)
Dr. Dgayli, please advise. 

## 2023-04-24 ENCOUNTER — Other Ambulatory Visit: Payer: Self-pay

## 2023-04-24 NOTE — Progress Notes (Signed)
Assessment & Plan:  UTI symptoms -     Urine Culture -     Urinalysis, Routine w reflex microscopic -     POCT urinalysis dipstick -     Amoxicillin-Pot Clavulanate; Take 1 tablet by mouth 2 (two) times daily for 7 days.  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  Positive for ulnar deviation.  Pending x-ray.  Encouraged strict icing regimen, Voltaren gel.  Orders: -     DG Hand Complete Right; Future  Abnormal CT of the chest Assessment & Plan: CT angio chest 03/21/2023. Stable precarinal lymph node.  The report showed persistent vague asymmetry left lateral chest wall.  Finding may be postsurgical.  03/2023 Discussed over staff message with Dr Smith Robert, Dr Elgie Collard and Dr Ulice Bold.   Follow up with Dr Elgie Collard 05/18/23 and Dr Smith Robert 06/01/23.  Anticipate repeat CT chest by pulmonology. I have discussed with patient today communication as well. will follow.    Urinary tract infection with hematuria, site unspecified Assessment & Plan: Afebrile.  Concern for UTI with positive red blood cells, white blood cells and nitrates on poc urine.  Patient most comfortable with starting empiric therapy ahead of urine culture.  I have sent in Augmentin.  Advised her to suspend trimethoprim while on Augmentin and then resume.  She will continue probiotics.  Await urine culture      Return precautions given.   Risks, benefits, and alternatives of the medications and treatment plan prescribed today were discussed, and patient expressed understanding.   Education regarding symptom management and diagnosis given to patient on AVS either electronically or printed.  No follow-ups on file.  Rennie Plowman, FNP  Subjective:    Patient ID: Tricia Berry, female    DOB: [birth year], 52 y.o.   MRN: [id number]  CC: Tricia Berry is a 52 y.o. female who presents today for follow up.   HPI: Complains of cloudy urine with  odor.   She feels 'uncomfortable' when she urinates.   Compliant with trimprix 100mg  every day. No UTI since starting medication by urology.   No fever, flank pain, N  She complains of right hand 3 MCP pain x one week.  She has noticed deviation of bilateral hands.  Pain onset after brushing something off partner's back.  She had swelling which has improved.  She has been soaking in epsom salt.  No numbness Joint is not warm or hot.   No laceration.    CT angio chest 03/21/2023. Stable precarinal lymph node.  The report showed persistent vague asymmetry left lateral chest wall.  Finding may be postsurgical.  Discuss Dr Smith Robert, Dr Elgie Collard and Dr Ulice Bold     Follow up with Dr Elgie Collard 05/18/23 and Dr Smith Robert 06/01/23   Documented UTI 09/28/2021 e coli, sensitive to augmentin.    Allergies: Morphine, Sertraline hcl, Sulfa antibiotics, Sulfamethoxazole, and Sulfonamide derivatives Current Outpatient Medications on File Prior to Visit  Medication Sig Dispense Refill   acetaminophen (TYLENOL) 650 MG CR tablet Take 1,300 mg by mouth every 8 (eight) hours as needed for pain.     albuterol (PROVENTIL) (2.5 MG/3ML) 0.083% nebulizer solution Take 3 mLs (2.5 mg total) by nebulization every 6 (six) hours as needed for wheezing or shortness of breath. 360 mL 12   albuterol (VENTOLIN HFA) 108 (90 Base) MCG/ACT inhaler Inhale 2 puffs into the lungs every 6 (six) hours as needed for wheezing or shortness  of breath. 6.7 g 0   anastrozole (ARIMIDEX) 1 MG tablet Take 1 tablet (1 mg total) by mouth daily. 30 tablet 5   atovaquone (MEPRON) 750 MG/5ML suspension Take 10 mLs (1,500 mg total) by mouth daily. 300 mL 1   b complex vitamins capsule Take 1 capsule by mouth daily.     baclofen (LIORESAL) 10 MG tablet Take 1 tablet (10 mg total) by mouth 3 (three) times daily as needed for muscle spasms. 60 tablet 2   Blood Glucose Monitoring Suppl (BLOOD GLUCOSE MONITOR SYSTEM) w/Device KIT Test in the morning, at  noon, and at bedtime. 1 kit 0   calcium-vitamin D (OSCAL WITH D) 500-5 MG-MCG tablet Take 1 tablet by mouth daily with breakfast.     cyanocobalamin (VITAMIN B12) 1000 MCG/ML injection Inject 1 mL (1,000 mcg total) into the muscle every 30 (thirty) days. 3 mL 4   dextromethorphan-guaiFENesin (ROBITUSSIN-DM) 10-100 MG/5ML liquid Take 10 mLs by mouth every 4 (four) hours as needed for cough.     docusate sodium (COLACE) 100 MG capsule Take 100 mg by mouth daily as needed for mild constipation.     FLUoxetine (PROZAC) 20 MG capsule Take 1 capsule (20 mg total) by mouth every morning. 90 capsule 3   gentamicin ointment (GARAMYCIN) 0.1 % Apply to nose 3 (three) times daily. 30 g 12   gentamicin ointment (GARAMYCIN) 0.1 % Apply 1 Application topically to nose 3 (three) times daily. 30 g 12   ibuprofen (ADVIL) 400 MG tablet Take 400 mg by mouth every 6 (six) hours as needed.     Lactobacillus (PROBIOTIC ACIDOPHILUS PO) Take 1 capsule by mouth daily.     LORazepam (ATIVAN) 0.5 MG tablet Take 1 tablet (0.5 mg total) by mouth 2 (two) times daily as needed for anxiety. 60 tablet 2   mirabegron ER (MYRBETRIQ) 50 MG TB24 tablet Take 1 tablet (50 mg total) by mouth daily. 30 tablet 1   naloxone (NARCAN) nasal spray 4 mg/0.1 mL Place 1 spray into the nose as needed for opioid-induced respiratory depresssion. In case of emergency (overdose), spray once into each nostril. If no response within 3 minutes, repeat application and call 911. 2 each 0   nicotine (NICODERM CQ - DOSED IN MG/24 HOURS) 21 mg/24hr patch Place 1 patch (21 mg total) onto the skin daily. 28 patch 1   oxyCODONE (OXY IR/ROXICODONE) 5 MG immediate release tablet Take 1 tablet (5 mg total) by mouth every 4 (four) hours as needed for pain. 120 tablet 0   Oxycodone HCl 10 MG TABS Take 1 tablet (10 mg total) by mouth every 4 (four) hours as needed for pain. 180 tablet 0   [START ON 05/09/2023] Oxycodone HCl 10 MG TABS Take 1 tablet (10 mg total) by mouth  every 4 (four) hours as needed for pain. 180 tablet 0   [START ON 06/07/2023] Oxycodone HCl 10 MG TABS Take 1 tablet (10 mg total) by mouth every 4 (four) hours as needed for pain. 180 tablet 0   pregabalin (LYRICA) 150 MG capsule Take 1 capsule (150 mg total) by mouth in the morning, at noon, and at bedtime. 90 capsule 2   rosuvastatin (CRESTOR) 5 MG tablet Take 1 tablet (5 mg total) by mouth daily. 90 tablet 0   trimethoprim (TRIMPEX) 100 MG tablet Take 1 tablet (100 mg total) by mouth daily. 90 tablet 3   umeclidinium-vilanterol (ANORO ELLIPTA) 62.5-25 MCG/ACT AEPB Inhale 1 puff into the lungs daily. 60 each  6   vitamin B-12 (CYANOCOBALAMIN) 100 MCG tablet Take 100 mcg by mouth daily. gummy     Current Facility-Administered Medications on File Prior to Visit  Medication Dose Route Frequency Provider Last Rate Last Admin   acetaminophen (TYLENOL) 325 MG tablet            diphenhydrAMINE (BENADRYL) 25 mg capsule            goserelin (ZOLADEX) injection 3.6 mg  3.6 mg Subcutaneous Q28 days Creig Hines, MD   3.6 mg at 02/02/23 1206   heparin lock flush 100 unit/mL  500 Units Intravenous Once Creig Hines, MD       Zoledronic Acid (ZOMETA) 4 MG/100ML IVPB             Review of Systems  Constitutional:  Negative for chills and fever.  Respiratory:  Negative for cough.   Cardiovascular:  Negative for chest pain and palpitations.  Gastrointestinal:  Negative for nausea and vomiting.  Genitourinary:  Positive for dysuria.  Musculoskeletal:  Positive for arthralgias and joint swelling.  Neurological:  Negative for numbness.      Objective:    BP 116/80   Pulse 64   Temp 98.6 F (37 C) (Oral)   Ht 5\' 4"  (1.626 m)   Wt 133 lb 9.6 oz (60.6 kg)   SpO2 97%   BMI 22.93 kg/m  BP Readings from Last 3 Encounters:  05/02/23 116/80  04/02/23 118/76  03/23/23 112/73   Wt Readings from Last 3 Encounters:  05/02/23 133 lb 9.6 oz (60.6 kg)  04/02/23 132 lb 12.8 oz (60.2 kg)  03/21/23 136  lb (61.7 kg)    Physical Exam Vitals reviewed.  Constitutional:      Appearance: She is well-developed.  Eyes:     Conjunctiva/sclera: Conjunctivae normal.  Cardiovascular:     Rate and Rhythm: Normal rate and regular rhythm.     Pulses: Normal pulses.     Heart sounds: Normal heart sounds.  Pulmonary:     Effort: Pulmonary effort is normal.     Breath sounds: Normal breath sounds. No wheezing, rhonchi or rales.  Musculoskeletal:     Right hand: Bony tenderness present. No swelling. Normal capillary refill. Normal pulse.     Left hand: No swelling or bony tenderness. Normal capillary refill. Normal pulse.       Hands:     Comments: Pain over right third and fourth MCP.  No swelling, increased warmth erythema.  Skin is intact.  Ulnar deviation, Heberden's nodes present bilaterally.  Skin:    General: Skin is warm and dry.  Neurological:     Mental Status: She is alert.  Psychiatric:        Speech: Speech normal.        Behavior: Behavior normal.        Thought Content: Thought content normal.

## 2023-04-25 ENCOUNTER — Other Ambulatory Visit: Payer: Self-pay

## 2023-04-25 MED ORDER — OXYCODONE HCL 10 MG PO TABS
10.0000 mg | ORAL_TABLET | ORAL | 0 refills | Status: DC | PRN
  Filled 2023-05-09: qty 180, 30d supply, fill #0

## 2023-04-25 MED ORDER — OXYCODONE HCL 10 MG PO TABS: 10.0000 mg | ORAL_TABLET | ORAL | 0 refills | Status: DC | PRN

## 2023-05-02 ENCOUNTER — Encounter: Payer: Self-pay | Admitting: Family

## 2023-05-02 ENCOUNTER — Ambulatory Visit (INDEPENDENT_AMBULATORY_CARE_PROVIDER_SITE_OTHER): Payer: Commercial Managed Care - HMO | Admitting: Family

## 2023-05-02 ENCOUNTER — Other Ambulatory Visit: Payer: Self-pay

## 2023-05-02 ENCOUNTER — Ambulatory Visit (INDEPENDENT_AMBULATORY_CARE_PROVIDER_SITE_OTHER): Payer: Commercial Managed Care - HMO

## 2023-05-02 VITALS — BP 116/80 | HR 64 | Temp 98.6°F | Ht 64.0 in | Wt 133.6 lb

## 2023-05-02 DIAGNOSIS — M79641 Pain in right hand: Secondary | ICD-10-CM | POA: Diagnosis not present

## 2023-05-02 DIAGNOSIS — R399 Unspecified symptoms and signs involving the genitourinary system: Secondary | ICD-10-CM | POA: Diagnosis not present

## 2023-05-02 DIAGNOSIS — R319 Hematuria, unspecified: Secondary | ICD-10-CM

## 2023-05-02 DIAGNOSIS — N390 Urinary tract infection, site not specified: Secondary | ICD-10-CM

## 2023-05-02 DIAGNOSIS — R9389 Abnormal findings on diagnostic imaging of other specified body structures: Secondary | ICD-10-CM | POA: Diagnosis not present

## 2023-05-02 LAB — URINALYSIS, ROUTINE W REFLEX MICROSCOPIC
Bilirubin Urine: NEGATIVE
Ketones, ur: NEGATIVE
Nitrite: POSITIVE — AB
Specific Gravity, Urine: 1.02 (ref 1.000–1.030)
Total Protein, Urine: NEGATIVE
Urine Glucose: NEGATIVE
Urobilinogen, UA: 0.2 (ref 0.0–1.0)
pH: 6 (ref 5.0–8.0)

## 2023-05-02 LAB — POCT URINALYSIS DIPSTICK
Bilirubin, UA: NEGATIVE
Glucose, UA: NEGATIVE
Ketones, UA: NEGATIVE
Nitrite, UA: POSITIVE
Protein, UA: NEGATIVE
Spec Grav, UA: 1.02 (ref 1.010–1.025)
Urobilinogen, UA: 0.2 E.U./dL
pH, UA: 6 (ref 5.0–8.0)

## 2023-05-02 MED ORDER — AMOXICILLIN-POT CLAVULANATE 875-125 MG PO TABS
1.0000 | ORAL_TABLET | Freq: Two times a day (BID) | ORAL | 0 refills | Status: AC
Start: 2023-05-02 — End: 2023-05-09
  Filled 2023-05-02: qty 14, 7d supply, fill #0

## 2023-05-02 NOTE — Assessment & Plan Note (Addendum)
Afebrile.  Concern for UTI with positive red blood cells, white blood cells and nitrates on poc urine.  Patient most comfortable with starting empiric therapy ahead of urine culture.  I have sent in Augmentin.  Advised her to suspend trimethoprim while on Augmentin and then resume.  She will continue probiotics.  Await urine culture

## 2023-05-02 NOTE — Assessment & Plan Note (Signed)
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  Positive for ulnar deviation.  Pending x-ray.  Encouraged strict icing regimen, Voltaren gel.

## 2023-05-02 NOTE — Patient Instructions (Addendum)
Start augmentin  Continue probiotics  Suspend trimpex.   For hand pain, trial of volteren gel and icing 20 minutes at least and 2 times per day.

## 2023-05-02 NOTE — Assessment & Plan Note (Signed)
CT angio chest 03/21/2023. Stable precarinal lymph node.  The report showed persistent vague asymmetry left lateral chest wall.  Finding may be postsurgical.  03/2023 Discussed over staff message with Dr Smith Robert, Dr Elgie Collard and Dr Ulice Bold.   Follow up with Dr Elgie Collard 05/18/23 and Dr Smith Robert 06/01/23.  Anticipate repeat CT chest by pulmonology. I have discussed with patient today communication as well. will follow.

## 2023-05-04 ENCOUNTER — Inpatient Hospital Stay: Payer: Commercial Managed Care - HMO | Attending: Oncology

## 2023-05-04 DIAGNOSIS — C50212 Malignant neoplasm of upper-inner quadrant of left female breast: Secondary | ICD-10-CM | POA: Insufficient documentation

## 2023-05-04 DIAGNOSIS — Z5111 Encounter for antineoplastic chemotherapy: Secondary | ICD-10-CM | POA: Diagnosis present

## 2023-05-04 DIAGNOSIS — Z79899 Other long term (current) drug therapy: Secondary | ICD-10-CM | POA: Diagnosis not present

## 2023-05-04 LAB — URINE CULTURE
MICRO NUMBER:: 15182520
SPECIMEN QUALITY:: ADEQUATE

## 2023-05-04 MED ORDER — GOSERELIN ACETATE 3.6 MG ~~LOC~~ IMPL
3.6000 mg | DRUG_IMPLANT | SUBCUTANEOUS | Status: DC
  Administered 2023-05-04: 3.6 mg via SUBCUTANEOUS
  Filled 2023-05-04: qty 3.6

## 2023-05-07 ENCOUNTER — Telehealth: Payer: Self-pay

## 2023-05-07 NOTE — Telephone Encounter (Signed)
LVM to call back to go over results 

## 2023-05-08 ENCOUNTER — Other Ambulatory Visit: Payer: Self-pay

## 2023-05-08 ENCOUNTER — Ambulatory Visit: Payer: Commercial Managed Care - HMO | Admitting: Student in an Organized Health Care Education/Training Program

## 2023-05-08 ENCOUNTER — Encounter: Payer: Self-pay | Admitting: Student in an Organized Health Care Education/Training Program

## 2023-05-08 VITALS — BP 126/76 | HR 78 | Temp 97.9°F | Ht 60.0 in | Wt 132.0 lb

## 2023-05-08 DIAGNOSIS — J189 Pneumonia, unspecified organism: Secondary | ICD-10-CM | POA: Diagnosis not present

## 2023-05-08 DIAGNOSIS — J9611 Chronic respiratory failure with hypoxia: Secondary | ICD-10-CM

## 2023-05-08 DIAGNOSIS — J984 Other disorders of lung: Secondary | ICD-10-CM | POA: Diagnosis not present

## 2023-05-08 DIAGNOSIS — T50905A Adverse effect of unspecified drugs, medicaments and biological substances, initial encounter: Secondary | ICD-10-CM

## 2023-05-08 MED ORDER — PREDNISONE 10 MG PO TABS
10.0000 mg | ORAL_TABLET | Freq: Every day | ORAL | 0 refills | Status: AC
Start: 2023-05-08 — End: 2023-07-08
  Filled 2023-05-08: qty 60, 60d supply, fill #0

## 2023-05-08 NOTE — Progress Notes (Signed)
Assessment & Plan:   #Drug-induced pneumonitis #Shortness of breath #Chronic Hypoxic Respiratory Failure  She had presented to clinic for the evaluation of shortness of breath in the setting of known breast cancer s/p chemotherapy, resection, and immune therapy. She received Abemaciclib last year which was discontinued in November of 2023. She is now s/p bronchoscopy with BAL, with all infectious workup returning negative.   Her initial CT showed multi-focal infiltrates with ground glass and consolidative opacities. My differential includes inflammatory conditions such as drug induced pneumonitis from abemaciclib, organizing pneumonia, hypersensitivity pneumonitis given mold exposure at home, and DIP/Desquamative Interstitial Pneumonia given smoking. Abemaciclib could have a sustained effect with pneumonitis. Hypersensitivity pneumonitis, organizing pneumonia, and DIP are higher on my differential, however.   Repeat CT scan in march showed significant improvement in infiltrates, and we proceeded with taper down to 20 mg of prednisone. She unfortunately exacerbated in May of 2024 which was secondary to para-influenza infection. Given clinical improvement, I will proceed with further tapering of the prednisone to 10 mg daily for the next 6 weeks followed by 2 weeks of 5 mg of prednisone daily before fully discontinuing it.  I will then get a repeat high resolution chest CT and pulmonary function testing one month after steroid discontinuation and will see the patient for follow up then.    Finally, I encouraged her to continue to fully abstain from cigarettes.  - Pulmonary Function Test [facility] Only; Future - CT CHEST HIGH RESOLUTION; Future - predniSONE (DELTASONE) 10 MG tablet; Take 1 tablet (10 mg total) by mouth daily with breakfast.  Dispense: 60 tablet; Refill: 0   Return in about 3 months (around 08/08/2023).  I spent 30 minutes caring for this patient today, including preparing to see  the patient, obtaining a medical history , reviewing a separately obtained history, performing a medically appropriate examination and/or evaluation, counseling and educating the patient/family/caregiver, ordering medications, tests, or procedures, documenting clinical information in the electronic health record, and independently interpreting results (not separately reported/billed) and communicating results to the patient/family/caregiver  Raechel Chute, MD [facility] 05/08/2023 12:03 PM    End of visit medications:  Meds ordered this encounter  Medications   predniSONE (DELTASONE) 10 MG tablet    Sig: Take 1 tablet (10 mg total) by mouth daily with breakfast.    Dispense:  60 tablet    Refill:  0     Current Outpatient Medications:    acetaminophen (TYLENOL) 650 MG CR tablet, Take 1,300 mg by mouth every 8 (eight) hours as needed for pain., Disp: , Rfl:    albuterol (PROVENTIL) (2.5 MG/3ML) 0.083% nebulizer solution, Take 3 mLs (2.5 mg total) by nebulization every 6 (six) hours as needed for wheezing or shortness of breath., Disp: 360 mL, Rfl: 12   albuterol (VENTOLIN HFA) 108 (90 Base) MCG/ACT inhaler, Inhale 2 puffs into the lungs every 6 (six) hours as needed for wheezing or shortness of breath., Disp: 6.7 g, Rfl: 0   amoxicillin-clavulanate (AUGMENTIN) 875-125 MG tablet, Take 1 tablet by mouth 2 (two) times daily for 7 days., Disp: 14 tablet, Rfl: 0   anastrozole (ARIMIDEX) 1 MG tablet, Take 1 tablet (1 mg total) by mouth daily., Disp: 30 tablet, Rfl: 5   atovaquone (MEPRON) 750 MG/5ML suspension, Take 10 mLs (1,500 mg total) by mouth daily., Disp: 300 mL, Rfl: 1   b complex vitamins capsule, Take 1 capsule by mouth daily., Disp: , Rfl:    baclofen (LIORESAL) 10  MG tablet, Take 1 tablet (10 mg total) by mouth 3 (three) times daily as needed for muscle spasms., Disp: 60 tablet, Rfl: 2   Blood Glucose Monitoring Suppl (BLOOD GLUCOSE MONITOR SYSTEM) w/Device KIT,  Test in the morning, at noon, and at bedtime., Disp: 1 kit, Rfl: 0   calcium-vitamin D (OSCAL WITH D) 500-5 MG-MCG tablet, Take 1 tablet by mouth daily with breakfast., Disp: , Rfl:    cyanocobalamin (VITAMIN B12) 1000 MCG/ML injection, Inject 1 mL (1,000 mcg total) into the muscle every 30 (thirty) days., Disp: 3 mL, Rfl: 4   dextromethorphan-guaiFENesin (ROBITUSSIN-DM) 10-100 MG/5ML liquid, Take 10 mLs by mouth every 4 (four) hours as needed for cough., Disp: , Rfl:    docusate sodium (COLACE) 100 MG capsule, Take 100 mg by mouth daily as needed for mild constipation., Disp: , Rfl:    FLUoxetine (PROZAC) 20 MG capsule, Take 1 capsule (20 mg total) by mouth every morning., Disp: 90 capsule, Rfl: 3   gentamicin ointment (GARAMYCIN) 0.1 %, Apply to nose 3 (three) times daily., Disp: 30 g, Rfl: 12   gentamicin ointment (GARAMYCIN) 0.1 %, Apply 1 Application topically to nose 3 (three) times daily., Disp: 30 g, Rfl: 12   ibuprofen (ADVIL) 400 MG tablet, Take 400 mg by mouth every 6 (six) hours as needed., Disp: , Rfl:    Lactobacillus (PROBIOTIC ACIDOPHILUS PO), Take 1 capsule by mouth daily., Disp: , Rfl:    LORazepam (ATIVAN) 0.5 MG tablet, Take 1 tablet (0.5 mg total) by mouth 2 (two) times daily as needed for anxiety., Disp: 60 tablet, Rfl: 2   mirabegron ER (MYRBETRIQ) 50 MG TB24 tablet, Take 1 tablet (50 mg total) by mouth daily., Disp: 30 tablet, Rfl: 1   naloxone (NARCAN) nasal spray 4 mg/0.1 mL, Place 1 spray into the nose as needed for opioid-induced respiratory depresssion. In case of emergency (overdose), spray once into each nostril. If no response within 3 minutes, repeat application and call 911., Disp: 2 each, Rfl: 0   nicotine (NICODERM CQ - DOSED IN MG/24 HOURS) 21 mg/24hr patch, Place 1 patch (21 mg total) onto the skin daily., Disp: 28 patch, Rfl: 1   oxyCODONE (OXY IR/ROXICODONE) 5 MG immediate release tablet, Take 1 tablet (5 mg total) by mouth every 4 (four) hours as needed for  pain., Disp: 120 tablet, Rfl: 0   Oxycodone HCl 10 MG TABS, Take 1 tablet (10 mg total) by mouth every 4 (four) hours as needed for pain., Disp: 180 tablet, Rfl: 0   [START ON 05/09/2023] Oxycodone HCl 10 MG TABS, Take 1 tablet (10 mg total) by mouth every 4 (four) hours as needed for pain., Disp: 180 tablet, Rfl: 0   [START ON 06/07/2023] Oxycodone HCl 10 MG TABS, Take 1 tablet (10 mg total) by mouth every 4 (four) hours as needed for pain., Disp: 180 tablet, Rfl: 0   predniSONE (DELTASONE) 10 MG tablet, Take 1 tablet (10 mg total) by mouth daily with breakfast., Disp: 60 tablet, Rfl: 0   pregabalin (LYRICA) 150 MG capsule, Take 1 capsule (150 mg total) by mouth in the morning, at noon, and at bedtime., Disp: 90 capsule, Rfl: 2   rosuvastatin (CRESTOR) 5 MG tablet, Take 1 tablet (5 mg total) by mouth daily., Disp: 90 tablet, Rfl: 0   trimethoprim (TRIMPEX) 100 MG tablet, Take 1 tablet (100 mg total) by mouth daily., Disp: 90 tablet, Rfl: 3   umeclidinium-vilanterol (ANORO ELLIPTA) 62.5-25 MCG/ACT AEPB, Inhale 1 puff into  the lungs daily., Disp: 60 each, Rfl: 6   vitamin B-12 (CYANOCOBALAMIN) 100 MCG tablet, Take 100 mcg by mouth daily. gummy, Disp: , Rfl:  No current facility-administered medications for this visit.  Facility-Administered Medications Ordered in Other Visits:    acetaminophen (TYLENOL) 325 MG tablet, , , ,    diphenhydrAMINE (BENADRYL) 25 mg capsule, , , ,    goserelin (ZOLADEX) injection 3.6 mg, 3.6 mg, Subcutaneous, Q28 days, Creig Hines, MD, 3.6 mg at 02/02/23 1206   heparin lock flush 100 unit/mL, 500 Units, Intravenous, Once, Creig Hines, MD   Zoledronic Acid (ZOMETA) 4 MG/100ML IVPB, , , ,    Subjective:   PATIENT ID: Tricia Berry GENDER: female DOB: 11-24-1970, MRN: [id number]  Chief Complaint  Patient presents with   Follow-up    Occ heavy breathing with walking.     HPI  Ms. Whisner is a 52 year old female with a past medical history of breast  cancer who developed an inflammatory pneumonia. She was admitted to the hospital for management of acute on chronic hypoxic respiratory failure and has underwent bronchoscopy on 12/01/2022.   I connected with  Jlee Harkless Cenci on 01/31/23 by a video enabled telemedicine application and verified that I am speaking with the correct person using two identifiers. I connected from my office while Ms. Douty connected from her home. I discussed the limitations of evaluation and management by telemedicine. The patient expressed understanding and agreed to proceed.   During her initial clinic visit, she reported insidious symptoms in November with shortness of breath and severe hypoxia down to 87% SpO2. CXR at the time showed findings concerning for pneumonia followed by CT scan showing multi-focal infiltrates. Her main symptoms were those of dyspnea and cough. The symptoms started in the summer around the time the Abemaciclib was initiated.  She first noticed a cough that happened throughout the summer and culminated in the sudden onset of shortness of breath. Following this, she was initiated on a course of antibiotics and abemaciclib was halted.  Her symptoms slowly resolved but she reported exertional dyspnea.  She was started on Breztri by her primary care physician with some improvement.   A few days after our initial visit her symptoms worsened and she became increasingly short of breath, with increasing cough. She was seen by her primary care physician and was noted to be very hypoxic, and was sent to the ED. In addition to the dyspnea, she reports a cough as well as chills. She underwent a CT scan of the chest that showed multi-focal infiltrates and she was admitted for further management. I saw her in consultation in the hospital. Infectious workup sent included a beta-d-glucan, Aspergillus Glucomannan, and sputum induction for cultures and PJP PCR (cultures growing candida). She was started on 40 mg of  oral prednisone with Atovaquone for prophylaxis and was discharged on oxygen. On follow up, we arranged for bronchoscopy (BAL) performed on 2/9.   Following our last visit, she continued on PO prednisone that we have tapered to 20 mg daily. She had a repeat chest CT on 01/11/2023 which showed improvement. Her symptoms were overall much improved but she unfortunately fell ill again end of May, 2024. She was admitted to the hospital with increased shortness of breath and hypoxia, with repeat CT chest (with PE protocol) showing bilateral diffuse ground glass and consolidative opacities. Respiratory viral panel returned positive for para-influenza, explaining the decompensation. We maintained 20 mg prednisone daily following discharge  along side Atovaquone and she is here to discuss next steps. Overall symptoms have improved and she is back to the baseline before she had this decompensation. She continues to be a bit short of breath with exertion. She's not been using her oxygen.   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  She has received a course of Pertuzumab and Trastuzumab following her resection. She had been on Abemaciclib which was stopped in November of 2023 and has not been initiated since.   Patient used to work as an Insurance underwriter. She is a smoker, and has smoked on and off since her teenage years. She reports having mold in their bathroom that was professionally remediated. She is currently smoking 2 cigarettes a day.  Ancillary information including prior medications, full medical/surgical/family/social histories, and PFTs (when available) are listed below and have been reviewed.   Review of Systems  Constitutional:  Negative for chills, fever and weight loss.  Respiratory:  Positive for shortness of breath. Negative for cough, hemoptysis and sputum  production.   Skin:  Negative for rash.     Objective:   Vitals:   05/08/23 1149  BP: 126/76  Pulse: 78  Temp: 97.9 F (36.6 C)  TempSrc: Temporal  SpO2: 98%  Weight: 132 lb (59.9 kg)  Height: 5' (1.524 m)   98% on RA  BMI Readings from Last 3 Encounters:  05/08/23 25.78 kg/m  05/02/23 22.93 kg/m  04/02/23 25.94 kg/m   Wt Readings from Last 3 Encounters:  05/08/23 132 lb (59.9 kg)  05/02/23 133 lb 9.6 oz (60.6 kg)  04/02/23 132 lb 12.8 oz (60.2 kg)    Physical Exam Constitutional:      General: She is not in acute distress.    Appearance: Normal appearance. She is not ill-appearing.  HENT:     Head: Normocephalic.     Mouth/Throat:     Mouth: Mucous membranes are moist.  Cardiovascular:     Rate and Rhythm: Normal rate and regular rhythm.     Pulses: Normal pulses.     Heart sounds: Normal heart sounds.  Pulmonary:     Effort: Pulmonary effort is normal.     Breath sounds: Normal breath sounds. No wheezing, rhonchi or rales.  Abdominal:     General: Abdomen is flat.     Palpations: Abdomen is soft.  Musculoskeletal:        General: Normal range of motion.     Right lower leg: No edema.     Left lower leg: No edema.  Neurological:     General: No focal deficit present.     Mental Status: She is alert and oriented to person, place, and time. Mental status is at baseline.       Ancillary Information    Past Medical History:  Diagnosis Date   Anemia    Anginal pain (HCC)    excertion not cardiac related per patient   Anxiety    Breast cancer (HCC) 12/2021   left breast IMC   Diverticulitis    Dyspnea    Dysrhythmia    prolong QT   Family history of ovarian cancer    GERD (gastroesophageal reflux disease)    Headache    IBS (irritable bowel syndrome)    Neuromuscular disorder (HCC)    neuropathy from chemo   Pneumonia    PONV (postoperative nausea and vomiting)  severe migraine and vomiting post anesthesia   Scoliosis      Family  History  Problem Relation Age of Onset   Hypertension Mother    Osteoarthritis Mother    Diverticulitis Mother    Heart failure Father    Hypertension Father    Gout Father    Heart attack Father 70   Diverticulitis Brother    Ovarian cancer Paternal Grandmother      Past Surgical History:  Procedure Laterality Date   ABDOMINAL HYSTERECTOMY     still has ovaries, no gyn cancer, hysterectomy due to endometriosis. NO cervix on exam 01/10/21   BACK SURGERY     BREAST BIOPSY Left 09/15/2020   [place] bx of mass, path pending, Q marker   BREAST BIOPSY Left 09/15/2020   [place] bx of LN, hydromarker, path pending   BREAST RECONSTRUCTION WITH PLACEMENT OF TISSUE EXPANDER AND FLEX HD (ACELLULAR HYDRATED DERMIS) Bilateral 03/17/2021   Procedure: IMMEDIATE BILATERAL BREAST RECONSTRUCTION WITH PLACEMENT OF TISSUE EXPANDER AND FLEX HD (ACELLULAR HYDRATED DERMIS);  Surgeon: Peggye Form, DO;  Location: [facility];  Service: Plastics;  Laterality: Bilateral;   CAPSULECTOMY Right 10/18/2022   Procedure: Right breast capsule release with adjustment;  Surgeon: Peggye Form, DO;  Location: [facility];  Service: Plastics;  Laterality: Right;   COLONOSCOPY WITH PROPOFOL N/A 11/21/2021   Procedure: COLONOSCOPY WITH PROPOFOL;  Surgeon: Toney Reil, MD;  Location: [facility];  Service: Gastroenterology;  Laterality: N/A;   FLEXIBLE BRONCHOSCOPY Bilateral 12/01/2022   Procedure: FLEXIBLE BRONCHOSCOPY;  Surgeon: Raechel Chute, MD;  Location: [facility];  Service: Pulmonary;  Laterality: Bilateral;   IR IMAGING GUIDED PORT INSERTION  10/01/2020   MODIFIED MASTECTOMY Left 03/17/2021   Procedure: LEFT MODIFIED RADICAL MASTECTOMY;  Surgeon: Harriette Bouillon, MD;  Location: [facility];  Service: General;  Laterality: Left;   PORTA CATH REMOVAL Right 03/17/2021   Procedure: PORTA CATH REMOVAL;  Surgeon: Harriette Bouillon, MD;  Location: [facility];  Service: General;  Laterality: Right;   REMOVAL OF BILATERAL TISSUE EXPANDERS WITH PLACEMENT OF BILATERAL BREAST IMPLANTS Bilateral 05/09/2021   Procedure: REMOVAL OF BILATERAL TISSUE EXPANDERS WITH PLACEMENT OF BILATERAL BREAST IMPLANTS;  Surgeon: Peggye Form, DO;  Location: [facility];  Service: Plastics;  Laterality: Bilateral;  90 min   TOTAL MASTECTOMY Right 03/17/2021   Procedure: TOTAL MASTECTOMY;  Surgeon: Harriette Bouillon, MD;  Location: [facility];  Service: General;  Laterality: Right;    Social History   Socioeconomic History   Marital status: Significant Other    Spouse name: Not on file   Number of children: 2   Years of education: Not on file   Highest education level: Not on file  Occupational History   Occupation: Nurse    Employer: [employer]  Tobacco Use   Smoking status: Every Day    Current packs/day: 1.00    Average packs/day: 1 pack/day for 20.0 years (20.0 ttl pk-yrs)    Types: Cigarettes   Smokeless tobacco: Never   Tobacco comments:    1-2 cigarettes weekly- 05/08/2023  Vaping Use   Vaping status: Never Used  Substance and Sexual Activity   Alcohol use: Not Currently   Drug use: No   Sexual activity: Not Currently    Birth control/protection: Surgical    Comment: hyst  Other Topics Concern   Not on file  Social History Narrative   Patient works as an ICU  nurse at [employer]. She has 2 children at home who have special needs. She and her spouse are primary caregivers.    Social Determinants of Health   Financial Resource Strain: Not on file  Food Insecurity: No Food Insecurity (03/21/2023)   Hunger Vital Sign    Worried About Running Out of Food in the Last Year: Never true    Ran Out of Food in the Last Year: Never true  Transportation Needs: No Transportation Needs (03/21/2023)   PRAPARE - Administrator, Civil Service (Medical): No    Lack of Transportation (Non-Medical): No  Physical  Activity: Not on file  Stress: Not on file  Social Connections: Not on file  Intimate Partner Violence: Not At Risk (03/21/2023)   Humiliation, Afraid, Rape, and Kick questionnaire    Fear of Current or Ex-Partner: No    Emotionally Abused: No    Physically Abused: No    Sexually Abused: No     Allergies  Allergen Reactions   Morphine Nausea And Vomiting and Other (See Comments)    migranes Other reaction(s): Headache Migraine, vomiting   Sertraline Hcl     REACTION: Worsened symptoms of IBS   Sulfa Antibiotics Rash   Sulfamethoxazole Rash   Sulfonamide Derivatives Rash     CBC    Component Value Date/Time   WBC 6.4 03/21/2023 1514   RBC 4.24 03/21/2023 1514   HGB 12.4 03/21/2023 1514   HGB 14.3 08/23/2020 1033   HCT 39.2 03/21/2023 1514   HCT 42.9 08/23/2020 1033   PLT 179 03/21/2023 1514   PLT 222 08/23/2020 1033   MCV 92.5 03/21/2023 1514   MCV 91 08/23/2020 1033   MCH 29.2 03/21/2023 1514   MCHC 31.6 03/21/2023 1514   RDW 14.5 03/21/2023 1514   RDW 13.1 08/23/2020 1033   LYMPHSABS 0.9 03/21/2023 1514   LYMPHSABS 4.2 (H) 08/23/2020 1033   MONOABS 0.4 03/21/2023 1514   EOSABS 0.0 03/21/2023 1514   EOSABS 0.1 08/23/2020 1033   BASOSABS 0.0 03/21/2023 1514   BASOSABS 0.1 08/23/2020 1033    Pulmonary Functions Testing Results:    Latest Ref Rng & Units 12/07/2022    1:58 PM  PFT Results  FVC-Pre L 1.95   FVC-Predicted Pre % 64   FVC-Post L 1.94   FVC-Predicted Post % 64   Pre FEV1/FVC % % 64   Post FEV1/FCV % % 60   FEV1-Pre L 1.24   FEV1-Predicted Pre % 52   FEV1-Post L 1.17   DLCO uncorrected ml/min/mmHg 6.69   DLCO UNC% % 36   DLVA Predicted % 58   TLC L 1.57   TLC % Predicted % 35   RV % Predicted % -17     Outpatient Medications Prior to Visit  Medication Sig Dispense Refill   acetaminophen (TYLENOL) 650 MG CR tablet Take 1,300 mg by mouth every 8 (eight) hours as needed for pain.     albuterol (PROVENTIL) (2.5 MG/3ML) 0.083% nebulizer  solution Take 3 mLs (2.5 mg total) by nebulization every 6 (six) hours as needed for wheezing or shortness of breath. 360 mL 12   albuterol (VENTOLIN HFA) 108 (90 Base) MCG/ACT inhaler Inhale 2 puffs into the lungs every 6 (six) hours as needed for wheezing or shortness of breath. 6.7 g 0   amoxicillin-clavulanate (AUGMENTIN) 875-125 MG tablet Take 1 tablet by mouth 2 (two) times daily for 7 days. 14 tablet 0   anastrozole (ARIMIDEX) 1 MG tablet Take 1  tablet (1 mg total) by mouth daily. 30 tablet 5   atovaquone (MEPRON) 750 MG/5ML suspension Take 10 mLs (1,500 mg total) by mouth daily. 300 mL 1   b complex vitamins capsule Take 1 capsule by mouth daily.     baclofen (LIORESAL) 10 MG tablet Take 1 tablet (10 mg total) by mouth 3 (three) times daily as needed for muscle spasms. 60 tablet 2   Blood Glucose Monitoring Suppl (BLOOD GLUCOSE MONITOR SYSTEM) w/Device KIT Test in the morning, at noon, and at bedtime. 1 kit 0   calcium-vitamin D (OSCAL WITH D) 500-5 MG-MCG tablet Take 1 tablet by mouth daily with breakfast.     cyanocobalamin (VITAMIN B12) 1000 MCG/ML injection Inject 1 mL (1,000 mcg total) into the muscle every 30 (thirty) days. 3 mL 4   dextromethorphan-guaiFENesin (ROBITUSSIN-DM) 10-100 MG/5ML liquid Take 10 mLs by mouth every 4 (four) hours as needed for cough.     docusate sodium (COLACE) 100 MG capsule Take 100 mg by mouth daily as needed for mild constipation.     FLUoxetine (PROZAC) 20 MG capsule Take 1 capsule (20 mg total) by mouth every morning. 90 capsule 3   gentamicin ointment (GARAMYCIN) 0.1 % Apply to nose 3 (three) times daily. 30 g 12   gentamicin ointment (GARAMYCIN) 0.1 % Apply 1 Application topically to nose 3 (three) times daily. 30 g 12   ibuprofen (ADVIL) 400 MG tablet Take 400 mg by mouth every 6 (six) hours as needed.     Lactobacillus (PROBIOTIC ACIDOPHILUS PO) Take 1 capsule by mouth daily.     LORazepam (ATIVAN) 0.5 MG tablet Take 1 tablet (0.5 mg total) by mouth  2 (two) times daily as needed for anxiety. 60 tablet 2   mirabegron ER (MYRBETRIQ) 50 MG TB24 tablet Take 1 tablet (50 mg total) by mouth daily. 30 tablet 1   naloxone (NARCAN) nasal spray 4 mg/0.1 mL Place 1 spray into the nose as needed for opioid-induced respiratory depresssion. In case of emergency (overdose), spray once into each nostril. If no response within 3 minutes, repeat application and call 911. 2 each 0   nicotine (NICODERM CQ - DOSED IN MG/24 HOURS) 21 mg/24hr patch Place 1 patch (21 mg total) onto the skin daily. 28 patch 1   oxyCODONE (OXY IR/ROXICODONE) 5 MG immediate release tablet Take 1 tablet (5 mg total) by mouth every 4 (four) hours as needed for pain. 120 tablet 0   Oxycodone HCl 10 MG TABS Take 1 tablet (10 mg total) by mouth every 4 (four) hours as needed for pain. 180 tablet 0   [START ON 05/09/2023] Oxycodone HCl 10 MG TABS Take 1 tablet (10 mg total) by mouth every 4 (four) hours as needed for pain. 180 tablet 0   [START ON 06/07/2023] Oxycodone HCl 10 MG TABS Take 1 tablet (10 mg total) by mouth every 4 (four) hours as needed for pain. 180 tablet 0   pregabalin (LYRICA) 150 MG capsule Take 1 capsule (150 mg total) by mouth in the morning, at noon, and at bedtime. 90 capsule 2   rosuvastatin (CRESTOR) 5 MG tablet Take 1 tablet (5 mg total) by mouth daily. 90 tablet 0   trimethoprim (TRIMPEX) 100 MG tablet Take 1 tablet (100 mg total) by mouth daily. 90 tablet 3   umeclidinium-vilanterol (ANORO ELLIPTA) 62.5-25 MCG/ACT AEPB Inhale 1 puff into the lungs daily. 60 each 6   vitamin B-12 (CYANOCOBALAMIN) 100 MCG tablet Take 100 mcg by mouth daily.  gummy     Facility-Administered Medications Prior to Visit  Medication Dose Route Frequency Provider Last Rate Last Admin   acetaminophen (TYLENOL) 325 MG tablet            diphenhydrAMINE (BENADRYL) 25 mg capsule            goserelin (ZOLADEX) injection 3.6 mg  3.6 mg Subcutaneous Q28 days Creig Hines, MD   3.6 mg at 02/02/23  1206   heparin lock flush 100 unit/mL  500 Units Intravenous Once Creig Hines, MD       Zoledronic Acid (ZOMETA) 4 MG/100ML IVPB

## 2023-05-08 NOTE — Patient Instructions (Addendum)
We will take 10 mg of prednisone daily for 6 weeks Followed by 5 mg daily (half a pill) for 2 weeks Then stop prednisone  Continue taking atovaquone for the next month and then discontinue.  We will get a breathing test and repeat CT in 3 months and then I will see you for follow up after.

## 2023-05-09 ENCOUNTER — Other Ambulatory Visit: Payer: Self-pay

## 2023-05-09 NOTE — Addendum Note (Signed)
Addended by: [place], Khloi Rawl on: 05/09/2023 03:40 PM   Modules accepted: Orders

## 2023-05-14 NOTE — Progress Notes (Unsigned)
05/15/2023 9:18 AM   Tricia Berry 1971-04-30 [id number]  Referring provider: Allegra Grana, FNP [address] [place],  [place] [address]  Urological history: 1.  High risk hematuria -Smoker -CTU (11/2021) -no worrisome findings -cysto (11/2021) - normal urethral meatus with very slight stenosis externally   2. rUTI's -Contribute factors of age, vaginal atrophy and IBS -Documented urine cultures over the last year 2024 for Klebsiella oxytoca  05/02/2023 - Klebsiella oxytoca   03/02/2023 Klebsiella oxytoca  December 13, 2022 mixed urogenital flora  -Trimethoprim 100 mg daily  3. OAB -Contributing factors of age, vaginal atrophy, IBS, smoking, hypertension and pelvic surgery   Chief Complaint  Patient presents with   Over Active Bladder   Follow-up    HPI: Tricia Berry is a 52 y.o. female who presents today for 87-month follow up.   At her visit on December 13, 2022, she had been experiencing 8 or more daytime voids, 1-2 nocturia and a strong urge to urinate.  She does not have issues with urinary incontinence.  She does not engage in fluid restrictions.  She does engage in toilet mapping.  She has recently been hospitalized for hypoxia secondary to a fungal pneumonia.  She is currently not taking any OAB medications, but she is taking trimethoprim 100 mg daily.  She has issues with dryness with anticholinergic OAB.   Because of her recent hospitalizations, she is not certain if her urinary symptoms have worsened or at baseline because she feels she is felt poorly for so long.  Patient denies any modifying or aggravating factors.  Patient denies any gross hematuria, dysuria or suprapubic/flank pain.  Patient denies any fevers, chills, nausea or vomiting.  UA yellow slightly cloudy, specific gravity 1.020, 1+ blood, pH 6.5, 2+protein, trace leuk, 11-30 WBC's, 11-30 RBC's and 0-10 epithelial cells.  Urine MUF.  PVR 175 mL.  She was given samples of  Gemtesa 75 mg for her OAB symptoms.   She checked with her insurance company and it appeared that Myrbetriq was on the formulary.    At her visit on February 13, 2023, she is having 8 or more daytime urinations, 1-2 nocturia with a mild urge to urinate.  She does not have urinary leakage and she does engage in toilet mapping.  UA yellow clear, specific gravity 1.015, 1+ blood, pH 7.0, 0-5 WBCs, 11-30 RBCs, 0-10 epithelial cells, mucus threads are present and few bacteria.  PVR 39 mL  She is taking the Myrbetriq 50 mg daily.  She feels she has had some decrease in her urinary frequency and urgency.  She does have instances when she is sitting on the toilet and has to sit a little bit longer to achieve the feeling of completely emptying her bladder.  Patient denies any modifying or aggravating factors.  Patient denies any gross hematuria, dysuria or suprapubic/flank pain.  Patient denies any fevers, chills, nausea or vomiting.   She was encouraged to continue the Myrbetriq 50 mg daily and to follow-up in 3 months.  She also is continuing the trimethoprim 100 mg daily.  She was diagnosed with a Klebsiella UTI on July 10 through her PCPs office and was treated with 7 days of culture appropriate antibiotics.  She has not made a more daytime urinations, 1-2 nocturia with a strong urge to urinate.  She does not have incontinence.  She does not wear absorbent products for leakage, she is not limiting fluid intake and she does engage in toilet  mapping.  She is experiencing incomplete bladder emptying, she states when she sits to urinate she does not completely empty her bladder and has to wait a few minutes or tip forward to completely empty.  She has discontinued her Myrbetriq 50 mg daily.  She felt she noticed no difference while on the medication.  Patient denies any modifying or aggravating factors.  Patient denies any gross hematuria, dysuria or suprapubic/flank pain.  Patient denies any fevers, chills, nausea  or vomiting.   UA yellow clear, specific gravity less than 1.005, 2+ blood, pH 6.5, 0-5 WBCs, 11-30 RBCs, 0-10 epithelial cells and a few bacteria.  PVR 54 mL   PMH: Past Medical History:  Diagnosis Date   Anemia    Anginal pain (HCC)    excertion not cardiac related per patient   Anxiety    Breast cancer (HCC) 12/2021   left breast IMC   Diverticulitis    Dyspnea    Dysrhythmia    prolong QT   Family history of ovarian cancer    GERD (gastroesophageal reflux disease)    Headache    IBS (irritable bowel syndrome)    Neuromuscular disorder (HCC)    neuropathy from chemo   Pneumonia    PONV (postoperative nausea and vomiting)    severe migraine and vomiting post anesthesia   Scoliosis     Surgical History: Past Surgical History:  Procedure Laterality Date   ABDOMINAL HYSTERECTOMY     still has ovaries, no gyn cancer, hysterectomy due to endometriosis. NO cervix on exam 01/10/21   BACK SURGERY     BREAST BIOPSY Left 09/15/2020   [place] bx of mass, path pending, Q marker   BREAST BIOPSY Left 09/15/2020   [place] bx of LN, hydromarker, path pending   BREAST RECONSTRUCTION WITH PLACEMENT OF TISSUE EXPANDER AND FLEX HD (ACELLULAR HYDRATED DERMIS) Bilateral 03/17/2021   Procedure: IMMEDIATE BILATERAL BREAST RECONSTRUCTION WITH PLACEMENT OF TISSUE EXPANDER AND FLEX HD (ACELLULAR HYDRATED DERMIS);  Surgeon: Peggye Form, DO;  Location: [facility];  Service: Plastics;  Laterality: Bilateral;   CAPSULECTOMY Right 10/18/2022   Procedure: Right breast capsule release with adjustment;  Surgeon: Peggye Form, DO;  Location: [facility];  Service: Plastics;  Laterality: Right;   COLONOSCOPY WITH PROPOFOL N/A 11/21/2021   Procedure: COLONOSCOPY WITH PROPOFOL;  Surgeon: Toney Reil, MD;  Location: [facility];  Service: Gastroenterology;  Laterality: N/A;   FLEXIBLE BRONCHOSCOPY Bilateral 12/01/2022   Procedure: FLEXIBLE BRONCHOSCOPY;  Surgeon:  Raechel Chute, MD;  Location: [facility];  Service: Pulmonary;  Laterality: Bilateral;   IR IMAGING GUIDED PORT INSERTION  10/01/2020   MODIFIED MASTECTOMY Left 03/17/2021   Procedure: LEFT MODIFIED RADICAL MASTECTOMY;  Surgeon: Harriette Bouillon, MD;  Location: [facility];  Service: General;  Laterality: Left;   PORTA CATH REMOVAL Right 03/17/2021   Procedure: PORTA CATH REMOVAL;  Surgeon: Harriette Bouillon, MD;  Location: [facility];  Service: General;  Laterality: Right;   REMOVAL OF BILATERAL TISSUE EXPANDERS WITH PLACEMENT OF BILATERAL BREAST IMPLANTS Bilateral 05/09/2021   Procedure: REMOVAL OF BILATERAL TISSUE EXPANDERS WITH PLACEMENT OF BILATERAL BREAST IMPLANTS;  Surgeon: Peggye Form, DO;  Location: [facility];  Service: Plastics;  Laterality: Bilateral;  90 min   TOTAL MASTECTOMY Right 03/17/2021   Procedure: TOTAL MASTECTOMY;  Surgeon: Harriette Bouillon, MD;  Location: [facility];  Service: General;  Laterality: Right;    Home Medications:  Allergies as of  05/15/2023       Reactions   Morphine Nausea And Vomiting, Other (See Comments)   migranes Other reaction(s): Headache Migraine, vomiting   Sertraline Hcl    REACTION: Worsened symptoms of IBS   Sulfa Antibiotics Rash   Sulfamethoxazole Rash   Sulfonamide Derivatives Rash        Medication List        Accurate as of May 15, 2023  9:18 AM. If you have any questions, ask your nurse or doctor.          acetaminophen 650 MG CR tablet Commonly known as: TYLENOL Take 1,300 mg by mouth every 8 (eight) hours as needed for pain.   albuterol (2.5 MG/3ML) 0.083% nebulizer solution Commonly known as: PROVENTIL Take 3 mLs (2.5 mg total) by nebulization every 6 (six) hours as needed for wheezing or shortness of breath.   albuterol 108 (90 Base) MCG/ACT inhaler Commonly known as: VENTOLIN HFA Inhale 2 puffs into the lungs every 6 (six) hours as needed for  wheezing or shortness of breath.   anastrozole 1 MG tablet Commonly known as: ARIMIDEX Take 1 tablet (1 mg total) by mouth daily.   Anoro Ellipta 62.5-25 MCG/ACT Aepb Generic drug: umeclidinium-vilanterol Inhale 1 puff into the lungs daily.   atovaquone 750 MG/5ML suspension Commonly known as: MEPRON Take 10 mLs (1,500 mg total) by mouth daily.   b complex vitamins capsule Take 1 capsule by mouth daily.   baclofen 10 MG tablet Commonly known as: LIORESAL Take 1 tablet (10 mg total) by mouth 3 (three) times daily as needed for muscle spasms.   calcium-vitamin D 500-5 MG-MCG tablet Commonly known as: OSCAL WITH D Take 1 tablet by mouth daily with breakfast.   dextromethorphan-guaiFENesin 10-100 MG/5ML liquid Commonly known as: ROBITUSSIN-DM Take 10 mLs by mouth every 4 (four) hours as needed for cough.   docusate sodium 100 MG capsule Commonly known as: COLACE Take 100 mg by mouth daily as needed for mild constipation.   FLUoxetine 20 MG capsule Commonly known as: PROZAC Take 1 capsule (20 mg total) by mouth every morning.   FreeStyle Freedom Lite w/Device Kit Test in the morning, at noon, and at bedtime.   gentamicin ointment 0.1 % Commonly known as: GARAMYCIN Apply to nose 3 (three) times daily. What changed: Another medication with the same name was removed. Continue taking this medication, and follow the directions you see here. Changed by: Michiel Cowboy   ibuprofen 400 MG tablet Commonly known as: ADVIL Take 400 mg by mouth every 6 (six) hours as needed.   LORazepam 0.5 MG tablet Commonly known as: ATIVAN Take 1 tablet (0.5 mg total) by mouth 2 (two) times daily as needed for anxiety.   Myrbetriq 50 MG Tb24 tablet Generic drug: mirabegron ER Take 1 tablet (50 mg total) by mouth daily.   naloxone 4 MG/0.1ML Liqd nasal spray kit Commonly known as: NARCAN Place 1 spray into the nose as needed for opioid-induced respiratory depresssion. In case of  emergency (overdose), spray once into each nostril. If no response within 3 minutes, repeat application and call 911.   nicotine 21 mg/24hr patch Commonly known as: NICODERM CQ - dosed in mg/24 hours Apply one patch to skin once daily (Place 1 patch (21 mg total) onto the skin daily.)   Oxycodone HCl 10 MG Tabs Take 1 tablet (10 mg total) by mouth every 4 (four) hours as needed for pain. What changed: Another medication with the same name was removed. Continue  taking this medication, and follow the directions you see here. Changed by: Michiel Cowboy   predniSONE 10 MG tablet Commonly known as: DELTASONE Take 1 tablet (10 mg total) by mouth daily with breakfast.   pregabalin 150 MG capsule Commonly known as: LYRICA Take 1 capsule (150 mg total) by mouth in the morning, at noon, and at bedtime.   PROBIOTIC ACIDOPHILUS PO Take 1 capsule by mouth daily.   rosuvastatin 5 MG tablet Commonly known as: CRESTOR Take 1 tablet (5 mg total) by mouth daily.   trimethoprim 100 MG tablet Commonly known as: TRIMPEX Take 1 tablet (100 mg total) by mouth daily.   vitamin B-12 100 MCG tablet Commonly known as: CYANOCOBALAMIN Take 100 mcg by mouth daily. gummy   cyanocobalamin 1000 MCG/ML injection Commonly known as: VITAMIN B12 Inject 1 mL into the muscle once monthly (Inject 1 mL (1,000 mcg total) into the muscle every 30 (thirty) days.)        Allergies:  Allergies  Allergen Reactions   Morphine Nausea And Vomiting and Other (See Comments)    migranes Other reaction(s): Headache Migraine, vomiting   Sertraline Hcl     REACTION: Worsened symptoms of IBS   Sulfa Antibiotics Rash   Sulfamethoxazole Rash   Sulfonamide Derivatives Rash    Family History: Family History  Problem Relation Age of Onset   Hypertension Mother    Osteoarthritis Mother    Diverticulitis Mother    Heart failure Father    Hypertension Father    Gout Father    Heart attack Father 30    Diverticulitis Brother    Ovarian cancer Paternal Grandmother     Social History:  reports that she has been smoking cigarettes. She has a 20 pack-year smoking history. She has never used smokeless tobacco. She reports that she does not currently use alcohol. She reports that she does not use drugs.  ROS: Pertinent ROS in HPI  Physical Exam: BP 136/77   Pulse 88   Wt 134 lb (60.8 kg)   BMI 26.17 kg/m   Constitutional:  Well nourished. Alert and oriented, No acute distress. HEENT: [place] AT, moist mucus membranes.  Trachea midline, no masses. Cardiovascular: No clubbing, cyanosis, or edema. Respiratory: Normal respiratory effort, no increased work of breathing. Neurologic: Grossly intact, no focal deficits, moving all 4 extremities. Psychiatric: Normal mood and affect.    Laboratory Data: Lab Results  Component Value Date   CREATININE 1.15 (H) 03/21/2023   Lab Results  Component Value Date   AST 25 03/21/2023   Lab Results  Component Value Date   ALT 22 03/21/2023   Urinalysis Results for orders placed or performed in visit on 05/15/23  Microscopic Examination   Urine  Result Value Ref Range   WBC, UA 0-5 0 - 5 /hpf   RBC, Urine 11-30 (A) 0 - 2 /hpf   Epithelial Cells (non renal) 0-10 0 - 10 /hpf   Bacteria, UA Few None seen/Few  Urinalysis, Complete  Result Value Ref Range   Specific Gravity, UA <1.005 (L) 1.005 - 1.030   pH, UA 6.5 5.0 - 7.5   Color, UA Yellow Yellow   Appearance Ur Clear Clear   Leukocytes,UA Negative Negative   Protein,UA Negative Negative/Trace   Glucose, UA Negative Negative   Ketones, UA Negative Negative   RBC, UA 2+ (A) Negative   Bilirubin, UA Negative Negative   Urobilinogen, Ur 0.2 0.2 - 1.0 mg/dL   Nitrite, UA Negative Negative   Microscopic  Examination See below:   Bladder Scan (Post Void Residual) in office  Result Value Ref Range   Scan Result    *Note: Due to a large number of results and/or encounters for the requested  time period, some results have not been displayed. A complete set of results can be found in Results Review.    I have reviewed the labs.   Pertinent Imaging:  05/15/23 08:48  Scan Result    Assessment & Plan:    1. High risk hematuria -smoker -hematuria work up (2023) negative -no reports of gross heme -UA 11-30 RBC's -Will schedule renal ultrasound to evaluate her upper tracts as she continues to have microscopic hematuria with negative and positive cultures despite being on a prophylactic antibiotic -I am sending her urine for culture today, if it is positive we will go ahead and treat with culture appropriate antibiotic and then recheck to ensure the microscopic hematuria has resolved -If the urine culture is negative, we will get her scheduled for cystoscopy with Dr. Apolinar Junes -I explained to her that with her smoking history she is at an increased risk for bladder cancer and is important for her to follow-up regarding these tests  2. OAB -failed OAB medications -will manage conservatively  3. rUTI's -she has had two UTI's over the last 6 months despite being on trimethoprim daily -will order RUS  to evaluate for possible nidus -Continue trimethoprim 100 mg daily  4.  Feelings of incomplete bladder emptying -encouraged more fluid intake -Urine cultures pending -If urine culture is negative or her microscopic hematuria persists, she will be scheduled for repeat cystoscopy with Dr. Apolinar Junes  Return for pending urine culture results .  These notes generated with voice recognition software. I apologize for typographical errors.  Cloretta Ned  [facility] [address]  [address] [place], [place] [address] [phone]

## 2023-05-15 ENCOUNTER — Encounter: Payer: Self-pay | Admitting: Urology

## 2023-05-15 ENCOUNTER — Ambulatory Visit: Payer: Commercial Managed Care - HMO | Admitting: Urology

## 2023-05-15 VITALS — BP 136/77 | HR 88 | Wt 134.0 lb

## 2023-05-15 DIAGNOSIS — R3914 Feeling of incomplete bladder emptying: Secondary | ICD-10-CM

## 2023-05-15 DIAGNOSIS — Z87440 Personal history of urinary (tract) infections: Secondary | ICD-10-CM | POA: Diagnosis not present

## 2023-05-15 DIAGNOSIS — R319 Hematuria, unspecified: Secondary | ICD-10-CM

## 2023-05-15 DIAGNOSIS — N390 Urinary tract infection, site not specified: Secondary | ICD-10-CM

## 2023-05-15 DIAGNOSIS — N3281 Overactive bladder: Secondary | ICD-10-CM | POA: Diagnosis not present

## 2023-05-15 LAB — URINALYSIS, COMPLETE
Bilirubin, UA: NEGATIVE
Glucose, UA: NEGATIVE
Ketones, UA: NEGATIVE
Leukocytes,UA: NEGATIVE
Nitrite, UA: NEGATIVE
Protein,UA: NEGATIVE
Specific Gravity, UA: <1.005 — ABNORMAL LOW (ref 1.005–1.030)
Urobilinogen, Ur: 0.2 mg/dL (ref 0.2–1.0)
pH, UA: 6.5 (ref 5.0–7.5)

## 2023-05-15 LAB — MICROSCOPIC EXAMINATION

## 2023-05-15 LAB — BLADDER SCAN AMB NON-IMAGING

## 2023-05-17 LAB — CULTURE, URINE COMPREHENSIVE

## 2023-05-21 ENCOUNTER — Other Ambulatory Visit: Payer: Self-pay

## 2023-05-21 ENCOUNTER — Other Ambulatory Visit: Payer: Self-pay | Admitting: Urology

## 2023-05-21 DIAGNOSIS — N390 Urinary tract infection, site not specified: Secondary | ICD-10-CM

## 2023-05-21 MED ORDER — CIPROFLOXACIN HCL 500 MG PO TABS
500.0000 mg | ORAL_TABLET | Freq: Two times a day (BID) | ORAL | 0 refills | Status: AC
Start: 2023-05-21 — End: 2023-05-31
  Filled 2023-05-21: qty 20, 10d supply, fill #0

## 2023-05-23 ENCOUNTER — Other Ambulatory Visit: Payer: Self-pay

## 2023-05-25 ENCOUNTER — Other Ambulatory Visit: Payer: Self-pay | Admitting: Internal Medicine

## 2023-05-25 ENCOUNTER — Ambulatory Visit
Admission: RE | Admit: 2023-05-25 | Discharge: 2023-05-25 | Disposition: A | Discharge status: Home / Self Care | Payer: Commercial Managed Care - HMO | Source: Ambulatory Visit | Attending: Internal Medicine | Admitting: Internal Medicine

## 2023-05-25 ENCOUNTER — Ambulatory Visit
Admission: RE | Admit: 2023-05-25 | Discharge: 2023-05-25 | Disposition: A | Discharge status: Home / Self Care | Payer: Commercial Managed Care - HMO | Source: Ambulatory Visit | Attending: Urology | Admitting: Urology

## 2023-05-25 DIAGNOSIS — I2541 Coronary artery aneurysm: Secondary | ICD-10-CM

## 2023-05-25 DIAGNOSIS — G939 Disorder of brain, unspecified: Secondary | ICD-10-CM | POA: Diagnosis present

## 2023-05-25 DIAGNOSIS — R319 Hematuria, unspecified: Secondary | ICD-10-CM | POA: Insufficient documentation

## 2023-05-25 DIAGNOSIS — T451X5A Adverse effect of antineoplastic and immunosuppressive drugs, initial encounter: Secondary | ICD-10-CM

## 2023-05-25 DIAGNOSIS — N390 Urinary tract infection, site not specified: Secondary | ICD-10-CM | POA: Insufficient documentation

## 2023-05-25 MED ORDER — GADOBUTROL 1 MMOL/ML IV SOLN
6.0000 mL | Freq: Once | INTRAVENOUS | Status: AC | PRN
  Administered 2023-05-25: 6 mL via INTRAVENOUS

## 2023-06-01 ENCOUNTER — Inpatient Hospital Stay (HOSPITAL_BASED_OUTPATIENT_CLINIC_OR_DEPARTMENT_OTHER): Payer: Commercial Managed Care - HMO | Admitting: Oncology

## 2023-06-01 ENCOUNTER — Other Ambulatory Visit: Payer: Self-pay

## 2023-06-01 ENCOUNTER — Inpatient Hospital Stay: Payer: Commercial Managed Care - HMO | Attending: Oncology | Admitting: Internal Medicine

## 2023-06-01 ENCOUNTER — Inpatient Hospital Stay: Payer: Commercial Managed Care - HMO

## 2023-06-01 ENCOUNTER — Encounter: Payer: Self-pay | Admitting: Internal Medicine

## 2023-06-01 ENCOUNTER — Encounter: Payer: Self-pay | Admitting: Oncology

## 2023-06-01 VITALS — BP 110/93 | HR 76 | Temp 97.9°F | Resp 20 | Wt 132.8 lb

## 2023-06-01 VITALS — BP 110/93 | HR 76 | Temp 97.9°F | Resp 20 | Wt 132.0 lb

## 2023-06-01 DIAGNOSIS — C7931 Secondary malignant neoplasm of brain: Secondary | ICD-10-CM | POA: Insufficient documentation

## 2023-06-01 DIAGNOSIS — G620 Drug-induced polyneuropathy: Secondary | ICD-10-CM | POA: Diagnosis not present

## 2023-06-01 DIAGNOSIS — C50911 Malignant neoplasm of unspecified site of right female breast: Secondary | ICD-10-CM

## 2023-06-01 DIAGNOSIS — Z5181 Encounter for therapeutic drug level monitoring: Secondary | ICD-10-CM

## 2023-06-01 DIAGNOSIS — Z170 Estrogen receptor positive status [ER+]: Secondary | ICD-10-CM | POA: Insufficient documentation

## 2023-06-01 DIAGNOSIS — Z79811 Long term (current) use of aromatase inhibitors: Secondary | ICD-10-CM

## 2023-06-01 DIAGNOSIS — I671 Cerebral aneurysm, nonruptured: Secondary | ICD-10-CM | POA: Insufficient documentation

## 2023-06-01 DIAGNOSIS — C50412 Malignant neoplasm of upper-outer quadrant of left female breast: Secondary | ICD-10-CM | POA: Diagnosis present

## 2023-06-01 DIAGNOSIS — C50212 Malignant neoplasm of upper-inner quadrant of left female breast: Secondary | ICD-10-CM

## 2023-06-01 DIAGNOSIS — T451X5A Adverse effect of antineoplastic and immunosuppressive drugs, initial encounter: Secondary | ICD-10-CM | POA: Diagnosis not present

## 2023-06-01 DIAGNOSIS — Z7983 Long term (current) use of bisphosphonates: Secondary | ICD-10-CM

## 2023-06-01 LAB — CBC WITH DIFFERENTIAL/PLATELET
Abs Immature Granulocytes: 0.07 10*3/uL (ref 0.00–0.07)
Basophils Absolute: 0.1 10*3/uL (ref 0.0–0.1)
Basophils Relative: 1 %
Eosinophils Absolute: 0.1 10*3/uL (ref 0.0–0.5)
Eosinophils Relative: 1 %
HCT: 46 % (ref 36.0–46.0)
Hemoglobin: 14.7 g/dL (ref 12.0–15.0)
Immature Granulocytes: 1 %
Lymphocytes Relative: 27 %
Lymphs Abs: 2.4 10*3/uL (ref 0.7–4.0)
MCH: 28.7 pg (ref 26.0–34.0)
MCHC: 32 g/dL (ref 30.0–36.0)
MCV: 89.8 fL (ref 80.0–100.0)
Monocytes Absolute: 0.8 10*3/uL (ref 0.1–1.0)
Monocytes Relative: 8 %
Neutro Abs: 5.7 10*3/uL (ref 1.7–7.7)
Neutrophils Relative %: 62 %
Platelets: 216 10*3/uL (ref 150–400)
RBC: 5.12 MIL/uL — ABNORMAL HIGH (ref 3.87–5.11)
RDW: 17.1 % — ABNORMAL HIGH (ref 11.5–15.5)
WBC: 9.2 10*3/uL (ref 4.0–10.5)
nRBC: 0 % (ref 0.0–0.2)

## 2023-06-01 LAB — COMPREHENSIVE METABOLIC PANEL
ALT: 19 U/L (ref 0–44)
AST: 21 U/L (ref 15–41)
Albumin: 4.1 g/dL (ref 3.5–5.0)
Alkaline Phosphatase: 57 U/L (ref 38–126)
Anion gap: 9 (ref 5–15)
BUN: 9 mg/dL (ref 6–20)
CO2: 22 mmol/L (ref 22–32)
Calcium: 9.2 mg/dL (ref 8.9–10.3)
Chloride: 107 mmol/L (ref 98–111)
Creatinine, Ser: 0.94 mg/dL (ref 0.44–1.00)
GFR, Estimated: >60 mL/min (ref >60)
Glucose, Bld: 97 mg/dL (ref 70–99)
Potassium: 3.7 mmol/L (ref 3.5–5.1)
Sodium: 138 mmol/L (ref 135–145)
Total Bilirubin: 0.5 mg/dL (ref 0.3–1.2)
Total Protein: 7.1 g/dL (ref 6.5–8.1)

## 2023-06-01 MED ORDER — ZOLEDRONIC ACID 4 MG/100ML IV SOLN
4.0000 mg | INTRAVENOUS | Status: DC
  Administered 2023-06-01: 4 mg via INTRAVENOUS
  Filled 2023-06-01: qty 100

## 2023-06-01 MED ORDER — SODIUM CHLORIDE 0.9 % IV SOLN
INTRAVENOUS | Status: DC
  Filled 2023-06-01: qty 250

## 2023-06-01 MED ORDER — OXYCODONE HCL 5 MG PO TABS
10.0000 mg | ORAL_TABLET | Freq: Once | ORAL | Status: AC
  Administered 2023-06-01: 10 mg via ORAL
  Filled 2023-06-01: qty 2

## 2023-06-01 NOTE — Progress Notes (Unsigned)
Patient states since starting taking the prednisone she has developed knots on her right side of her neck and around her thyroid. Patient states she had to use oxygen last night because when she lays down she feels as if she is choking.

## 2023-06-01 NOTE — Progress Notes (Signed)
[facility] at [facility] [address]  [place], [place] [address] [phone]   Interval Evaluation  Date of Service: 06/01/23 Patient Name: Tricia Berry Patient MRN: [id number] Patient DOB: Sep 03, 1971 Provider: Henreitta Leber, MD  Identifying Statement:  Tricia Berry is a 52 y.o. female with Brain aneurysm  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HCC)   Primary Cancer:  Oncologic History: Oncology History  Malignant neoplasm of left female breast (HCC)  09/26/2020 Initial Diagnosis   Malignant neoplasm of upper-outer quadrant of left breast in female, estrogen receptor positive (HCC)   10/08/2020 - 02/18/2021 Chemotherapy         10/08/2020 Cancer Staging   Staging form: Breast, AJCC 8th Edition - Clinical stage from 10/08/2020: Stage IIIB (cT4, cN1, cM0, G2, ER+, PR+, HER2-) - Signed by Creig Hines, MD on 10/08/2020    Genetic Testing   Negative genetic testing. No pathogenic variants identified on the [facility] Common Hereditary Cancers Panel. The report date is 11/18/2020.  The Multi-Cancer Panel offered by [facility] includes sequencing and/or deletion duplication testing of the following 84 genes: AIP, ALK, APC, ATM, AXIN2,BAP1,  BARD1, BLM, BMPR1A, BRCA1, BRCA2, BRIP1, CASR, CDC73, CDH1, CDK4, CDKN1B, CDKN1C, CDKN2A (p14ARF), CDKN2A (p16INK4a), CEBPA, CHEK2, CTNNA1, DICER1, DIS3L2, EGFR (c.2369C>T, p.Thr790Met variant only), EPCAM (Deletion/duplication testing only), FH, FLCN, GATA2, GPC3, GREM1 (Promoter region deletion/duplication testing only), HOXB13 (c.251G>A, p.Gly84Glu), HRAS, KIT, MAX, MEN1, MET, MITF (c.952G>A, p.Glu318Lys variant only), MLH1, MSH2, MSH3, MSH6, MUTYH, NBN, NF1, NF2, NTHL1, PALB2, PDGFRA, PHOX2B, PMS2, POLD1, POLE, POT1, PRKAR1A, PTCH1, PTEN, RAD50, RAD51C, RAD51D, RB1, RECQL4, RET,  RUNX1, SDHAF2, SDHA (sequence changes only), SDHB, SDHC, SDHD, SMAD4, SMARCA4, SMARCB1, SMARCE1, STK11, SUFU, TERC, TERT, TMEM127,  TP53, TSC1, TSC2, VHL, WRN and WT1.    04/11/2021 Cancer Staging   Staging form: Breast, AJCC 8th Edition - Pathologic stage from 04/11/2021: No Stage Recommended (ypT2, pN2a, cM0, G2, ER+, PR+, HER2+) - Signed by Creig Hines, MD on 04/11/2021 Stage prefix: Post-therapy Histologic grading system: 3 grade system   04/27/2021 - 06/10/2021 Chemotherapy         07/01/2021 - 06/07/2022 Chemotherapy   Patient is on Treatment Plan : BREAST Weekly Paclitaxel + Trastuzumab + Pertuzumab q21d x 8 cycles / Trastuzumab + Pertuzumab q21d x 4 cycles     06/28/2022 - 07/19/2022 Chemotherapy   Patient is on Treatment Plan : BREAST Trastuzumab  + Pertuzumab q21d x 13 cycles       Interval History: Tricia Berry presents today for follow up after recent MRI and MRA.  Neuropathy is stable overall on the lyrica and oxycodone.  Still complains of difficulty manipulating her phone and opening jars.  Recently had hospitalization for pneumonia.  No other new or progressive deficits.  Continues to describe brain fog, short term memory lapses as prior.  H+P (06/02/22) Patient presents today to review brain MRI, as well as neuropathic symptoms.  She describes >1 year history of tingling, pain, burning, cold sensitivity affecting her feet and hands.  Pain is severe in particular at night, with burning in her left big toe keeping her awake.  In addition she describes more chronic aching pain in her lower back.  She also complains of feelings of imbalance, but no falls and no walking aid.  This does not appear to be getting worse in recent months since taxol was discontinued.   She otherwise denies focal neurologic deficits, no focal weakness, numbness, double vision, slurred speech.  No seizures.  Medications: Current Outpatient Medications on File Prior to Visit  Medication Sig Dispense Refill   acetaminophen (TYLENOL) 650 MG CR tablet Take 1,300 mg by mouth every 8 (eight) hours as needed for pain.     albuterol  (PROVENTIL) (2.5 MG/3ML) 0.083% nebulizer solution Take 3 mLs (2.5 mg total) by nebulization every 6 (six) hours as needed for wheezing or shortness of breath. 360 mL 12   albuterol (VENTOLIN HFA) 108 (90 Base) MCG/ACT inhaler Inhale 2 puffs into the lungs every 6 (six) hours as needed for wheezing or shortness of breath. 6.7 g 0   anastrozole (ARIMIDEX) 1 MG tablet Take 1 tablet (1 mg total) by mouth daily. 30 tablet 5   atovaquone (MEPRON) 750 MG/5ML suspension Take 10 mLs (1,500 mg total) by mouth daily. 300 mL 1   b complex vitamins capsule Take 1 capsule by mouth daily.     baclofen (LIORESAL) 10 MG tablet Take 1 tablet (10 mg total) by mouth 3 (three) times daily as needed for muscle spasms. 60 tablet 2   Blood Glucose Monitoring Suppl (BLOOD GLUCOSE MONITOR SYSTEM) w/Device KIT Test in the morning, at noon, and at bedtime. 1 kit 0   calcium-vitamin D (OSCAL WITH D) 500-5 MG-MCG tablet Take 1 tablet by mouth daily with breakfast.     ciprofloxacin (CIPRO) 500 MG tablet Take 1 tablet (500 mg total) by mouth 2 (two) times daily for 10 days. 20 tablet 0   cyanocobalamin (VITAMIN B12) 1000 MCG/ML injection Inject 1 mL (1,000 mcg total) into the muscle every 30 (thirty) days. 3 mL 4   dextromethorphan-guaiFENesin (ROBITUSSIN-DM) 10-100 MG/5ML liquid Take 10 mLs by mouth every 4 (four) hours as needed for cough.     docusate sodium (COLACE) 100 MG capsule Take 100 mg by mouth daily as needed for mild constipation.     FLUoxetine (PROZAC) 20 MG capsule Take 1 capsule (20 mg total) by mouth every morning. 90 capsule 3   gentamicin ointment (GARAMYCIN) 0.1 % Apply to nose 3 (three) times daily. 30 g 12   ibuprofen (ADVIL) 400 MG tablet Take 400 mg by mouth every 6 (six) hours as needed.     Lactobacillus (PROBIOTIC ACIDOPHILUS PO) Take 1 capsule by mouth daily.     LORazepam (ATIVAN) 0.5 MG tablet Take 1 tablet (0.5 mg total) by mouth 2 (two) times daily as needed for anxiety. 60 tablet 2   mirabegron  ER (MYRBETRIQ) 50 MG TB24 tablet Take 1 tablet (50 mg total) by mouth daily. 30 tablet 1   naloxone (NARCAN) nasal spray 4 mg/0.1 mL Place 1 spray into the nose as needed for opioid-induced respiratory depresssion. In case of emergency (overdose), spray once into each nostril. If no response within 3 minutes, repeat application and call 911. 2 each 0   nicotine (NICODERM CQ - DOSED IN MG/24 HOURS) 21 mg/24hr patch Place 1 patch (21 mg total) onto the skin daily. 28 patch 1   Oxycodone HCl 10 MG TABS Take 1 tablet (10 mg total) by mouth every 4 (four) hours as needed for pain. 180 tablet 0   predniSONE (DELTASONE) 10 MG tablet Take 1 tablet (10 mg total) by mouth daily with breakfast. 60 tablet 0   pregabalin (LYRICA) 150 MG capsule Take 1 capsule (150 mg total) by mouth in the morning, at noon, and at bedtime. 90 capsule 2   rosuvastatin (CRESTOR) 5 MG tablet Take 1 tablet (5 mg total) by mouth daily. 90 tablet 0  umeclidinium-vilanterol (ANORO ELLIPTA) 62.5-25 MCG/ACT AEPB Inhale 1 puff into the lungs daily. 60 each 6   vitamin B-12 (CYANOCOBALAMIN) 100 MCG tablet Take 100 mcg by mouth daily. gummy     Current Facility-Administered Medications on File Prior to Visit  Medication Dose Route Frequency Provider Last Rate Last Admin   acetaminophen (TYLENOL) 325 MG tablet            diphenhydrAMINE (BENADRYL) 25 mg capsule            goserelin (ZOLADEX) injection 3.6 mg  3.6 mg Subcutaneous Q28 days Creig Hines, MD   3.6 mg at 02/02/23 1206   heparin lock flush 100 unit/mL  500 Units Intravenous Once Creig Hines, MD       Zoledronic Acid (ZOMETA) 4 MG/100ML IVPB             Allergies:  Allergies  Allergen Reactions   Morphine Nausea And Vomiting and Other (See Comments)    migranes Other reaction(s): Headache Migraine, vomiting   Sertraline Hcl     REACTION: Worsened symptoms of IBS   Sulfa Antibiotics Rash   Sulfamethoxazole Rash   Sulfonamide Derivatives Rash   Past Medical  History:  Past Medical History:  Diagnosis Date   Anemia    Anginal pain (HCC)    excertion not cardiac related per patient   Anxiety    Breast cancer (HCC) 12/2021   left breast IMC   Diverticulitis    Dyspnea    Dysrhythmia    prolong QT   Family history of ovarian cancer    GERD (gastroesophageal reflux disease)    Headache    IBS (irritable bowel syndrome)    Neuromuscular disorder (HCC)    neuropathy from chemo   Pneumonia    PONV (postoperative nausea and vomiting)    severe migraine and vomiting post anesthesia   Scoliosis    Past Surgical History:  Past Surgical History:  Procedure Laterality Date   ABDOMINAL HYSTERECTOMY     still has ovaries, no gyn cancer, hysterectomy due to endometriosis. NO cervix on exam 01/10/21   BACK SURGERY     BREAST BIOPSY Left 09/15/2020   [place] bx of mass, path pending, Q marker   BREAST BIOPSY Left 09/15/2020   [place] bx of LN, hydromarker, path pending   BREAST RECONSTRUCTION WITH PLACEMENT OF TISSUE EXPANDER AND FLEX HD (ACELLULAR HYDRATED DERMIS) Bilateral 03/17/2021   Procedure: IMMEDIATE BILATERAL BREAST RECONSTRUCTION WITH PLACEMENT OF TISSUE EXPANDER AND FLEX HD (ACELLULAR HYDRATED DERMIS);  Surgeon: Peggye Form, DO;  Location: [facility];  Service: Plastics;  Laterality: Bilateral;   CAPSULECTOMY Right 10/18/2022   Procedure: Right breast capsule release with adjustment;  Surgeon: Peggye Form, DO;  Location: [facility];  Service: Plastics;  Laterality: Right;   COLONOSCOPY WITH PROPOFOL N/A 11/21/2021   Procedure: COLONOSCOPY WITH PROPOFOL;  Surgeon: Toney Reil, MD;  Location: [facility];  Service: Gastroenterology;  Laterality: N/A;   FLEXIBLE BRONCHOSCOPY Bilateral 12/01/2022   Procedure: FLEXIBLE BRONCHOSCOPY;  Surgeon: Raechel Chute, MD;  Location: [facility];  Service: Pulmonary;  Laterality: Bilateral;   IR IMAGING GUIDED PORT INSERTION  10/01/2020   MODIFIED MASTECTOMY  Left 03/17/2021   Procedure: LEFT MODIFIED RADICAL MASTECTOMY;  Surgeon: Harriette Bouillon, MD;  Location: [facility];  Service: General;  Laterality: Left;   PORTA CATH REMOVAL Right 03/17/2021   Procedure: PORTA CATH REMOVAL;  Surgeon: Harriette Bouillon, MD;  Location: [facility];  Service: General;  Laterality: Right;   REMOVAL OF BILATERAL TISSUE EXPANDERS WITH PLACEMENT OF BILATERAL BREAST IMPLANTS Bilateral 05/09/2021   Procedure: REMOVAL OF BILATERAL TISSUE EXPANDERS WITH PLACEMENT OF BILATERAL BREAST IMPLANTS;  Surgeon: Peggye Form, DO;  Location: [facility];  Service: Plastics;  Laterality: Bilateral;  90 min   TOTAL MASTECTOMY Right 03/17/2021   Procedure: TOTAL MASTECTOMY;  Surgeon: Harriette Bouillon, MD;  Location: [facility];  Service: General;  Laterality: Right;   Social History:  Social History   Socioeconomic History   Marital status: Significant Other    Spouse name: Not on file   Number of children: 2   Years of education: Not on file   Highest education level: Not on file  Occupational History   Occupation: Nurse    Employer: [employer]  Tobacco Use   Smoking status: Every Day    Current packs/day: 1.00    Average packs/day: 1 pack/day for 20.0 years (20.0 ttl pk-yrs)    Types: Cigarettes   Smokeless tobacco: Never   Tobacco comments:    1-2 cigarettes weekly- 05/08/2023  Vaping Use   Vaping status: Never Used  Substance and Sexual Activity   Alcohol use: Not Currently   Drug use: No   Sexual activity: Not Currently    Birth control/protection: Surgical    Comment: hyst  Other Topics Concern   Not on file  Social History Narrative   Patient works as an Insurance underwriter at [employer]. She has 2 children at home who have special needs. She and her spouse are primary caregivers.    Social Determinants of Health   Financial Resource Strain: Not on file  Food Insecurity: No Food Insecurity (03/21/2023)    Hunger Vital Sign    Worried About Running Out of Food in the Last Year: Never true    Ran Out of Food in the Last Year: Never true  Transportation Needs: No Transportation Needs (03/21/2023)   PRAPARE - Administrator, Civil Service (Medical): No    Lack of Transportation (Non-Medical): No  Physical Activity: Not on file  Stress: Not on file  Social Connections: Not on file  Intimate Partner Violence: Not At Risk (03/21/2023)   Humiliation, Afraid, Rape, and Kick questionnaire    Fear of Current or Ex-Partner: No    Emotionally Abused: No    Physically Abused: No    Sexually Abused: No   Family History:  Family History  Problem Relation Age of Onset   Hypertension Mother    Osteoarthritis Mother    Diverticulitis Mother    Heart failure Father    Hypertension Father    Gout Father    Heart attack Father 29   Diverticulitis Brother    Ovarian cancer Paternal Grandmother     Review of Systems: Constitutional: Doesn't report fevers, chills or abnormal weight loss Eyes: Doesn't report blurriness of vision Ears, nose, mouth, throat, and face: Doesn't report sore throat Respiratory: Doesn't report cough, dyspnea or wheezes Cardiovascular: Doesn't report palpitation, chest discomfort  Gastrointestinal:  Doesn't report nausea, constipation, diarrhea GU: Doesn't report incontinence Skin: Doesn't report skin rashes Neurological: Per HPI Musculoskeletal: Doesn't report joint pain Behavioral/Psych: ++anxiety  Physical Exam: Vitals:   06/01/23 1015  BP: (!) 110/93  Pulse: 76  Resp: 20  Temp: 97.9 F (36.6 C)  SpO2: 100%    KPS: 90. General: Alert, cooperative, pleasant, in no acute distress Head: Normal EENT: No conjunctival injection or  scleral icterus.  Lungs: Resp effort normal Cardiac: Regular rate Abdomen: Non-distended abdomen Skin: No rashes cyanosis or petechiae. Extremities: No clubbing or edema  Neurologic Exam: Mental Status: Awake, alert,  attentive to examiner. Oriented to self and environment. Language is fluent with intact comprehension.  Cranial Nerves: Visual acuity is grossly normal. Visual fields are full. Extra-ocular movements intact. No ptosis. Face is symmetric Motor: Tone and bulk are normal. Power is full in both arms and legs. Reflexes are diminished, no pathologic reflexes present.  Sensory: Stocking neuropathy changes Gait: Normal.   Labs: I have reviewed the data as listed    Component Value Date/Time   NA 139 03/21/2023 1514   K 4.1 03/21/2023 1514   CL 100 03/21/2023 1514   CO2 28 03/21/2023 1514   GLUCOSE 147 (H) 03/21/2023 1514   BUN 9 03/21/2023 1514   CREATININE 1.15 (H) 03/21/2023 1514   CREATININE 0.84 12/08/2022 1216   CALCIUM 9.0 03/21/2023 1514   PROT 7.1 03/21/2023 1514   ALBUMIN 3.4 (L) 03/21/2023 1514   AST 25 03/21/2023 1514   AST 20 12/08/2022 1216   ALT 22 03/21/2023 1514   ALT 27 12/08/2022 1216   ALKPHOS 69 03/21/2023 1514   BILITOT 0.4 03/21/2023 1514   BILITOT 0.5 12/08/2022 1216   GFRNONAA 57 (L) 03/21/2023 1514   GFRNONAA >60 12/08/2022 1216   GFRAA >90 05/09/2012 1731   Lab Results  Component Value Date   WBC 6.4 03/21/2023   NEUTROABS 5.1 03/21/2023   HGB 12.4 03/21/2023   HCT 39.2 03/21/2023   MCV 92.5 03/21/2023   PLT 179 03/21/2023   Imaging:  [facility] Clinician Interpretation: I have personally reviewed the CNS images as listed.  My interpretation, in the context of the patient's clinical presentation, is  small right frontal lesion redemonstrated, MRA shows small posterior circulation aneurysm.  Pending official read  US RENAL  Result Date: 05/25/2023 CLINICAL DATA:  Recurrent urinary tract infection. EXAM: RENAL / URINARY TRACT ULTRASOUND COMPLETE COMPARISON:  None Available. FINDINGS: Right Kidney: Renal measurements: 10.8 x 5.0 x 4.8 cm = volume: 135 mL. Echogenicity within normal limits. No mass or hydronephrosis visualized. Left Kidney: Renal measurements:  9.6 x 3.7 x 3.5 cm = volume: 64.4 mL. Echogenicity within normal limits. No mass or hydronephrosis visualized. Bladder: Appears normal for degree of bladder distention. Other: None. IMPRESSION: No hydronephrosis. Electronically Signed   By: Annia Belt M.D.   On: 05/25/2023 22:16     Assessment/Plan Brain aneurysm  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HCC)  Tricia Berry presents with clinical syndrome consistent with symmetric, length dependent, small and large fiber peripheral neuropathy.  Etiology is exposure to taxol chemotherapy.  She will con't Lyrica 150mg  TID as prior.  Will con't 5mg  baclofen TID prn for spasticity symptoms.  MRI brain demonstrates stable enhancing right frontal lesion, pending official read.  Etiology is metastasis vs non-specific enhancement.  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  She is asymptomatic and has not experienced any bleeding. PHASES score is 4, which is considered low risk.    We will recommend continued annual surveillance of both brain parenchyma and vasculature.  She is agreeable with this.  We appreciate the opportunity to participate in the care of Tricia Berry.   We ask that Tricia Berry return to clinic in 12 months following next brain MRI/MRA, or sooner as needed.  All questions were answered. The patient knows to call the  clinic with any problems, questions or concerns. No barriers to learning were detected.  The total time spent in the encounter was 40 minutes and more than 50% was on counseling and review of test results   Henreitta Leber, MD Medical Director of Neuro-Oncology [facility] 06/01/23 10:16 AM

## 2023-06-02 ENCOUNTER — Encounter: Payer: Self-pay | Admitting: Oncology

## 2023-06-02 NOTE — Progress Notes (Signed)
Hematology/Oncology Consult note [facility]  [phone] [phone]  Patient Care Team: Allegra Grana, FNP as PCP - General (Family Medicine) Debbe Odea, MD as PCP - Cardiology (Cardiology) Jim Like, RN as Oncology Nurse Navigator Creig Hines, MD as Consulting Physician (Hematology and Oncology) Keitha Butte, RN as Registered Nurse (Oncology) Borders, Daryl Eastern, NP as Nurse Practitioner [facility])   Name of the patient: Tricia Berry  [id number]  1970/10/28   Date of visit: 06/02/23  Diagnosis-  invasive mammary carcinoma of the left breast stage III ypT2 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  Ultrasound also showed 5 abnormal lymph nodes in the axilla with cortical thickening.  Both the mass and the lymph nodes were biopsied and was consistent with invasive mammary carcinoma grade 2.  Tumor was ER +91- 100%, PR +11 to 20% and HER-2 negative.  Ki-67 15%.    PET CT scan showed hypermetabolism in the area of the left breast but no evidence of hypermetabolism in the left axilla Or evidence of distant metastatic disease.   Neoadjuvant dose dense ACT chemotherapy started on 10/08/2020. Interim ultrasound after 4 cycles of dose dense AC chemotherapy showed overall decrease in volume of the tumor.  Nodularity previously seen on ultrasound was also decreased in size.   Patient completed neoadjuvant AC Taxol chemotherapy and underwent bilateral mastectomy with reconstruction.Final pathology showed fibrocystic changes in the right breast with no malignancy.  3.6 cm invasive mammary  carcinoma in the left breast.  7 out of 16 lymph nodes positive for malignancy.  Treatment effect in the breast present but not robust.  Treatment effect in the lymph nodes minimal.  Margins negative.  Overall grade 2.  Extranodal extension present.  ER greater than 90% positive, PR 15% positive.  HER2 +2 equivocal and positive by FISH on the primary breast specimen.  HER2 testing was however negative in the lymph nodes.  Patient completed 1 year of adjuvant Herceptin and Perjeta in August 2023.  Patient could not tolerate adjuvant Verzenio and stopped taking it. She is currently on ovarian suppression plus arimidex.   patient sees Dr. Aundria Rud for chronic hypoxic respiratory failure which has been attributed to pneumonitis but it is unclear as to what the etiology is.  Interval history-patient is currently off steroids and was able to wean herself off oxygen.  She is feeling better in terms of her breathing.  Tolerating Arimidex well without any significant side effects.  Symptoms of peripheral neuropathy currently under good control with gabapentin and as needed oxycodone  ECOG PS- 1 Pain scale- 4 Opioid associated constipation- no  Review of systems- Review of Systems  Constitutional:  Positive for malaise/fatigue.  Neurological:  Positive for sensory change (Peripheral neuropathy).      Allergies  Allergen Reactions   Morphine Nausea And Vomiting and Other (See Comments)    migranes Other reaction(s): Headache Migraine, vomiting   Sertraline Hcl     REACTION: Worsened symptoms of IBS   Sulfa Antibiotics Rash   Sulfamethoxazole Rash   Sulfonamide Derivatives Rash     Past Medical History:  Diagnosis Date   Anemia  Anginal pain (HCC)    excertion not cardiac related per patient   Anxiety    Breast cancer (HCC) 12/2021   left breast IMC   Diverticulitis    Dyspnea    Dysrhythmia    prolong QT   Family history of ovarian cancer    GERD (gastroesophageal reflux disease)     Headache    IBS (irritable bowel syndrome)    Neuromuscular disorder (HCC)    neuropathy from chemo   Pneumonia    PONV (postoperative nausea and vomiting)    severe migraine and vomiting post anesthesia   Scoliosis      Past Surgical History:  Procedure Laterality Date   ABDOMINAL HYSTERECTOMY     still has ovaries, no gyn cancer, hysterectomy due to endometriosis. NO cervix on exam 01/10/21   BACK SURGERY     BREAST BIOPSY Left 09/15/2020   [place] bx of mass, path pending, Q marker   BREAST BIOPSY Left 09/15/2020   [place] bx of LN, hydromarker, path pending   BREAST RECONSTRUCTION WITH PLACEMENT OF TISSUE EXPANDER AND FLEX HD (ACELLULAR HYDRATED DERMIS) Bilateral 03/17/2021   Procedure: IMMEDIATE BILATERAL BREAST RECONSTRUCTION WITH PLACEMENT OF TISSUE EXPANDER AND FLEX HD (ACELLULAR HYDRATED DERMIS);  Surgeon: Peggye Form, DO;  Location: [facility];  Service: Plastics;  Laterality: Bilateral;   CAPSULECTOMY Right 10/18/2022   Procedure: Right breast capsule release with adjustment;  Surgeon: Peggye Form, DO;  Location: [facility];  Service: Plastics;  Laterality: Right;   COLONOSCOPY WITH PROPOFOL N/A 11/21/2021   Procedure: COLONOSCOPY WITH PROPOFOL;  Surgeon: Toney Reil, MD;  Location: [facility];  Service: Gastroenterology;  Laterality: N/A;   FLEXIBLE BRONCHOSCOPY Bilateral 12/01/2022   Procedure: FLEXIBLE BRONCHOSCOPY;  Surgeon: Raechel Chute, MD;  Location: [facility];  Service: Pulmonary;  Laterality: Bilateral;   IR IMAGING GUIDED PORT INSERTION  10/01/2020   MODIFIED MASTECTOMY Left 03/17/2021   Procedure: LEFT MODIFIED RADICAL MASTECTOMY;  Surgeon: Harriette Bouillon, MD;  Location: [facility];  Service: General;  Laterality: Left;   PORTA CATH REMOVAL Right 03/17/2021   Procedure: PORTA CATH REMOVAL;  Surgeon: Harriette Bouillon, MD;  Location: [facility];  Service: General;  Laterality: Right;    REMOVAL OF BILATERAL TISSUE EXPANDERS WITH PLACEMENT OF BILATERAL BREAST IMPLANTS Bilateral 05/09/2021   Procedure: REMOVAL OF BILATERAL TISSUE EXPANDERS WITH PLACEMENT OF BILATERAL BREAST IMPLANTS;  Surgeon: Peggye Form, DO;  Location: [facility];  Service: Plastics;  Laterality: Bilateral;  90 min   TOTAL MASTECTOMY Right 03/17/2021   Procedure: TOTAL MASTECTOMY;  Surgeon: Harriette Bouillon, MD;  Location: [facility];  Service: General;  Laterality: Right;    Social History   Socioeconomic History   Marital status: Significant Other    Spouse name: Not on file   Number of children: 2   Years of education: Not on file   Highest education level: Not on file  Occupational History   Occupation: Nurse    Employer: [employer]  Tobacco Use   Smoking status: Every Day    Current packs/day: 1.00    Average packs/day: 1 pack/day for 20.0 years (20.0 ttl pk-yrs)    Types: Cigarettes   Smokeless tobacco: Never   Tobacco comments:    1-2 cigarettes weekly- 05/08/2023  Vaping Use   Vaping status: Never Used  Substance and Sexual Activity   Alcohol use: Not Currently   Drug use: No   Sexual activity:  Not Currently    Birth control/protection: Surgical    Comment: hyst  Other Topics Concern   Not on file  Social History Narrative   Patient works as an Insurance underwriter at [employer]. She has 2 children at home who have special needs. She and her spouse are primary caregivers.    Social Determinants of Health   Financial Resource Strain: Not on file  Food Insecurity: No Food Insecurity (03/21/2023)   Hunger Vital Sign    Worried About Running Out of Food in the Last Year: Never true    Ran Out of Food in the Last Year: Never true  Transportation Needs: No Transportation Needs (03/21/2023)   PRAPARE - Administrator, Civil Service (Medical): No    Lack of Transportation (Non-Medical): No  Physical Activity: Not on file  Stress: Not on file  Social  Connections: Not on file  Intimate Partner Violence: Not At Risk (03/21/2023)   Humiliation, Afraid, Rape, and Kick questionnaire    Fear of Current or Ex-Partner: No    Emotionally Abused: No    Physically Abused: No    Sexually Abused: No    Family History  Problem Relation Age of Onset   Hypertension Mother    Osteoarthritis Mother    Diverticulitis Mother    Heart failure Father    Hypertension Father    Gout Father    Heart attack Father 15   Diverticulitis Brother    Ovarian cancer Paternal Grandmother      Current Outpatient Medications:    acetaminophen (TYLENOL) 650 MG CR tablet, Take 1,300 mg by mouth every 8 (eight) hours as needed for pain., Disp: , Rfl:    albuterol (PROVENTIL) (2.5 MG/3ML) 0.083% nebulizer solution, Take 3 mLs (2.5 mg total) by nebulization every 6 (six) hours as needed for wheezing or shortness of breath., Disp: 360 mL, Rfl: 12   albuterol (VENTOLIN HFA) 108 (90 Base) MCG/ACT inhaler, Inhale 2 puffs into the lungs every 6 (six) hours as needed for wheezing or shortness of breath., Disp: 6.7 g, Rfl: 0   anastrozole (ARIMIDEX) 1 MG tablet, Take 1 tablet (1 mg total) by mouth daily., Disp: 30 tablet, Rfl: 5   atovaquone (MEPRON) 750 MG/5ML suspension, Take 10 mLs (1,500 mg total) by mouth daily., Disp: 300 mL, Rfl: 1   b complex vitamins capsule, Take 1 capsule by mouth daily., Disp: , Rfl:    baclofen (LIORESAL) 10 MG tablet, Take 1 tablet (10 mg total) by mouth 3 (three) times daily as needed for muscle spasms., Disp: 60 tablet, Rfl: 2   Blood Glucose Monitoring Suppl (BLOOD GLUCOSE MONITOR SYSTEM) w/Device KIT, Test in the morning, at noon, and at bedtime., Disp: 1 kit, Rfl: 0   calcium-vitamin D (OSCAL WITH D) 500-5 MG-MCG tablet, Take 1 tablet by mouth daily with breakfast., Disp: , Rfl:    cyanocobalamin (VITAMIN B12) 1000 MCG/ML injection, Inject 1 mL (1,000 mcg total) into the muscle every 30 (thirty) days., Disp: 3 mL, Rfl: 4    dextromethorphan-guaiFENesin (ROBITUSSIN-DM) 10-100 MG/5ML liquid, Take 10 mLs by mouth every 4 (four) hours as needed for cough., Disp: , Rfl:    docusate sodium (COLACE) 100 MG capsule, Take 100 mg by mouth daily as needed for mild constipation., Disp: , Rfl:    FLUoxetine (PROZAC) 20 MG capsule, Take 1 capsule (20 mg total) by mouth every morning., Disp: 90 capsule, Rfl: 3   gentamicin ointment (GARAMYCIN) 0.1 %, Apply to nose 3 (three)  times daily., Disp: 30 g, Rfl: 12   ibuprofen (ADVIL) 400 MG tablet, Take 400 mg by mouth every 6 (six) hours as needed., Disp: , Rfl:    Lactobacillus (PROBIOTIC ACIDOPHILUS PO), Take 1 capsule by mouth daily., Disp: , Rfl:    LORazepam (ATIVAN) 0.5 MG tablet, Take 1 tablet (0.5 mg total) by mouth 2 (two) times daily as needed for anxiety., Disp: 60 tablet, Rfl: 2   mirabegron ER (MYRBETRIQ) 50 MG TB24 tablet, Take 1 tablet (50 mg total) by mouth daily., Disp: 30 tablet, Rfl: 1   naloxone (NARCAN) nasal spray 4 mg/0.1 mL, Place 1 spray into the nose as needed for opioid-induced respiratory depresssion. In case of emergency (overdose), spray once into each nostril. If no response within 3 minutes, repeat application and call 911., Disp: 2 each, Rfl: 0   nicotine (NICODERM CQ - DOSED IN MG/24 HOURS) 21 mg/24hr patch, Place 1 patch (21 mg total) onto the skin daily., Disp: 28 patch, Rfl: 1   Oxycodone HCl 10 MG TABS, Take 1 tablet (10 mg total) by mouth every 4 (four) hours as needed for pain., Disp: 180 tablet, Rfl: 0   predniSONE (DELTASONE) 10 MG tablet, Take 1 tablet (10 mg total) by mouth daily with breakfast., Disp: 60 tablet, Rfl: 0   pregabalin (LYRICA) 150 MG capsule, Take 1 capsule (150 mg total) by mouth in the morning, at noon, and at bedtime., Disp: 90 capsule, Rfl: 2   rosuvastatin (CRESTOR) 5 MG tablet, Take 1 tablet (5 mg total) by mouth daily., Disp: 90 tablet, Rfl: 0   umeclidinium-vilanterol (ANORO ELLIPTA) 62.5-25 MCG/ACT AEPB, Inhale 1 puff into  the lungs daily., Disp: 60 each, Rfl: 6   vitamin B-12 (CYANOCOBALAMIN) 100 MCG tablet, Take 100 mcg by mouth daily. gummy, Disp: , Rfl:  No current facility-administered medications for this visit.  Facility-Administered Medications Ordered in Other Visits:    acetaminophen (TYLENOL) 325 MG tablet, , , ,    diphenhydrAMINE (BENADRYL) 25 mg capsule, , , ,    goserelin (ZOLADEX) injection 3.6 mg, 3.6 mg, Subcutaneous, Q28 days, Creig Hines, MD, 3.6 mg at 02/02/23 1206   heparin lock flush 100 unit/mL, 500 Units, Intravenous, Once, Creig Hines, MD   Zoledronic Acid (ZOMETA) 4 MG/100ML IVPB, , , ,   Physical exam:  Vitals:   06/01/23 1118  BP: (!) 110/93  Pulse: 76  Resp: 20  Temp: 97.9 F (36.6 C)  SpO2: 100%  Weight: 132 lb (59.9 kg)   Physical Exam Cardiovascular:     Rate and Rhythm: Normal rate and regular rhythm.     Heart sounds: Normal heart sounds.  Pulmonary:     Effort: Pulmonary effort is normal.     Breath sounds: Normal breath sounds.  Abdominal:     General: Bowel sounds are normal.     Palpations: Abdomen is soft.  Skin:    General: Skin is warm and dry.  Neurological:     Mental Status: She is alert and oriented to person, place, and time.   Chest wall exam: Patient is s/p bilateral mastectomy with reconstruction.  No evidence of chest wall recurrence.  No palpable bilateral axillary adenopathy.     Latest Ref Rng & Units 06/01/2023   10:51 AM  CMP  Glucose 70 - 99 mg/dL 97   BUN 6 - 20 mg/dL 9   Creatinine 6.29 - 5.28 mg/dL 4.13   Sodium 244 - 010 mmol/L 138   Potassium 3.5 -  5.1 mmol/L 3.7   Chloride 98 - 111 mmol/L 107   CO2 22 - 32 mmol/L 22   Calcium 8.9 - 10.3 mg/dL 9.2   Total Protein 6.5 - 8.1 g/dL 7.1   Total Bilirubin 0.3 - 1.2 mg/dL 0.5   Alkaline Phos 38 - 126 U/L 57   AST 15 - 41 U/L 21   ALT 0 - 44 U/L 19       Latest Ref Rng & Units 06/01/2023   10:51 AM  CBC  WBC 4.0 - 10.5 K/uL 9.2   Hemoglobin 12.0 - 15.0 g/dL 86.5    Hematocrit 78.4 - 46.0 % 46.0   Platelets 150 - 400 K/uL 216     No images are attached to the encounter.  US RENAL  Result Date: 05/25/2023 CLINICAL DATA:  Recurrent urinary tract infection. EXAM: RENAL / URINARY TRACT ULTRASOUND COMPLETE COMPARISON:  None Available. FINDINGS: Right Kidney: Renal measurements: 10.8 x 5.0 x 4.8 cm = volume: 135 mL. Echogenicity within normal limits. No mass or hydronephrosis visualized. Left Kidney: Renal measurements: 9.6 x 3.7 x 3.5 cm = volume: 64.4 mL. Echogenicity within normal limits. No mass or hydronephrosis visualized. Bladder: Appears normal for degree of bladder distention. Other: None. IMPRESSION: No hydronephrosis. Electronically Signed   By: Annia Belt M.D.   On: 05/25/2023 22:16     Assessment and plan- Patient is a 52 y.o. female with history of stage III left breast cancer ER/PR positive and mixed pathology of HER2 positive and HER2 negative.  She is here for following issues:  History of breast cancer: Patient has undergone hysterectomy but has ovaries in situ.  Hormone levels postchemotherapy were not conclusive to determine menopausal status and therefore she has been on Zoladex plus Arimidex.  I will hold off on giving her Zoladex today.  I will repeat her hormone levels in 3 months and if they continue to remain postmenopausal she will continue with Arimidex alone. 2.  Patient receives adjuvant Zometa every 3 months and today would be her last dose.  Calcium levels acceptable to proceed with Zometa  3.  Chemo-induced peripheral neuropathy: Continue Lyrica and as needed oxycodone  4. Pneumonitis: Etiology unclear and putative causes could be mold exposure versus prior use of Verzenio versus smoking-related.  Being followed by pulmonary  5.  Abnormal brain MRI: Follows up with neuro-oncology.  She has an enhancing frontal lobe lesion and etiology of which is unclear metastases versus nonspecific enhancement.  Continued surveillance has been  recommended.  She has not undergone any radiation for this  Repeat hormone levels in 2 months and 4 months and I will see her back in 4 months   Visit Diagnosis 1. 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 Visit for monitoring Arimidex therapy   3. Encounter for monitoring zoledronic acid therapy      Dr. Owens Shark, MD, MPH [facility] at [facility] [phone] 06/02/2023 9:28 AM

## 2023-06-03 ENCOUNTER — Other Ambulatory Visit: Payer: Self-pay

## 2023-06-03 ENCOUNTER — Other Ambulatory Visit: Payer: Self-pay | Admitting: Family

## 2023-06-03 DIAGNOSIS — F411 Generalized anxiety disorder: Secondary | ICD-10-CM

## 2023-06-03 MED FILL — Baclofen Tab 10 MG: ORAL | 20 days supply | Qty: 60 | Fill #1 | Status: AC

## 2023-06-03 MED FILL — Atovaquone Susp 750 MG/5ML: ORAL | 16 days supply | Qty: 160 | Fill #1 | Status: CN

## 2023-06-04 ENCOUNTER — Ambulatory Visit: Payer: Commercial Managed Care - HMO

## 2023-06-04 ENCOUNTER — Other Ambulatory Visit: Payer: Self-pay

## 2023-06-04 ENCOUNTER — Other Ambulatory Visit: Payer: Commercial Managed Care - HMO

## 2023-06-04 ENCOUNTER — Ambulatory Visit: Payer: Commercial Managed Care - HMO | Admitting: Oncology

## 2023-06-04 MED FILL — Atovaquone Susp 750 MG/5ML: ORAL | 14 days supply | Qty: 140 | Fill #1 | Status: CN

## 2023-06-05 ENCOUNTER — Other Ambulatory Visit: Payer: Self-pay

## 2023-06-05 MED ORDER — OXYCODONE-ACETAMINOPHEN 10-325 MG PO TABS
1.0000 | ORAL_TABLET | ORAL | 0 refills | Status: DC | PRN
  Filled 2023-06-05: qty 60, 10d supply, fill #0

## 2023-06-05 MED FILL — Lorazepam Tab 0.5 MG: ORAL | 30 days supply | Qty: 60 | Fill #0 | Status: AC

## 2023-06-07 ENCOUNTER — Other Ambulatory Visit: Payer: Self-pay

## 2023-06-07 ENCOUNTER — Encounter (INDEPENDENT_AMBULATORY_CARE_PROVIDER_SITE_OTHER): Payer: Self-pay

## 2023-06-11 ENCOUNTER — Other Ambulatory Visit: Payer: Self-pay

## 2023-06-11 MED ORDER — OXYCODONE HCL 10 MG PO TABS
10.0000 mg | ORAL_TABLET | ORAL | 0 refills | Status: DC | PRN
  Filled 2023-06-11: qty 180, 30d supply, fill #0

## 2023-06-12 ENCOUNTER — Other Ambulatory Visit: Payer: Self-pay

## 2023-06-14 ENCOUNTER — Other Ambulatory Visit: Payer: Self-pay

## 2023-06-22 ENCOUNTER — Other Ambulatory Visit: Payer: Self-pay

## 2023-06-27 ENCOUNTER — Telehealth: Payer: Self-pay | Admitting: Student in an Organized Health Care Education/Training Program

## 2023-06-27 NOTE — Telephone Encounter (Signed)
Patient dropped off disability forms. Faxed to [employer] in [place] office. Patient phone number is [phone].

## 2023-07-02 ENCOUNTER — Other Ambulatory Visit: Payer: Self-pay | Admitting: Urology

## 2023-07-02 ENCOUNTER — Other Ambulatory Visit: Payer: Self-pay

## 2023-07-03 ENCOUNTER — Ambulatory Visit: Payer: Commercial Managed Care - HMO | Admitting: Family

## 2023-07-03 ENCOUNTER — Other Ambulatory Visit: Payer: Self-pay

## 2023-07-03 MED ORDER — TRIMETHOPRIM 100 MG PO TABS
100.0000 mg | ORAL_TABLET | Freq: Every day | ORAL | 3 refills | Status: DC
  Filled 2023-07-03: qty 90, 90d supply, fill #0
  Filled 2023-10-05 – 2023-10-16 (×2): qty 90, 90d supply, fill #1
  Filled 2024-01-23: qty 90, 90d supply, fill #2
  Filled 2024-04-22: qty 90, 90d supply, fill #3

## 2023-07-09 ENCOUNTER — Other Ambulatory Visit: Payer: Self-pay

## 2023-07-09 DIAGNOSIS — N390 Urinary tract infection, site not specified: Secondary | ICD-10-CM

## 2023-07-10 ENCOUNTER — Other Ambulatory Visit: Payer: Self-pay

## 2023-07-10 ENCOUNTER — Other Ambulatory Visit: Payer: Commercial Managed Care - HMO

## 2023-07-10 DIAGNOSIS — N390 Urinary tract infection, site not specified: Secondary | ICD-10-CM

## 2023-07-10 LAB — URINALYSIS, COMPLETE
Bilirubin, UA: NEGATIVE
Glucose, UA: NEGATIVE
Ketones, UA: NEGATIVE
Leukocytes,UA: NEGATIVE
Nitrite, UA: NEGATIVE
Protein,UA: NEGATIVE
Specific Gravity, UA: 1.01 (ref 1.005–1.030)
Urobilinogen, Ur: 0.2 mg/dL (ref 0.2–1.0)
pH, UA: 6 (ref 5.0–7.5)

## 2023-07-10 LAB — MICROSCOPIC EXAMINATION

## 2023-07-11 ENCOUNTER — Other Ambulatory Visit: Payer: Self-pay

## 2023-07-11 MED ORDER — OXYCODONE HCL 10 MG PO TABS
10.0000 mg | ORAL_TABLET | ORAL | 0 refills | Status: DC | PRN
  Filled 2023-07-11: qty 180, 30d supply, fill #0

## 2023-07-12 ENCOUNTER — Telehealth: Payer: Self-pay | Admitting: Student in an Organized Health Care Education/Training Program

## 2023-07-12 DIAGNOSIS — Z0289 Encounter for other administrative examinations: Secondary | ICD-10-CM

## 2023-07-12 NOTE — Telephone Encounter (Signed)
Patient's family member dropped off a disability form at the [place] office on 06/26/23.  I called the patient and found out she is still out of work and has no anticipated return to work date.  In addition to respiratory, she is seeing specialists for back pain, neuropathy in her feet and an injury to her right hand.  She is unable to drive. Cannot sit or stand for very long without getting out of breath.  She is also unable to bend, stretch or reach.  I have filled in the form and emailed it back to the [place] office for Dr.Dgayli's signature.  The signed form, plus office notes from the 05/08/2023 office visit will need to be faxed to [employer] at fax# [phone].  Claim ID# [id number].    Patient is aware of the $29 paperwork processing fee

## 2023-07-13 NOTE — Telephone Encounter (Signed)
I have faxed a signed copy of the disability form and 84 pages of office and hospital notes and test results to [facility]   fax# [phone].  Mailed a hard copy of the signed form to the patient.

## 2023-07-16 ENCOUNTER — Other Ambulatory Visit: Payer: Self-pay

## 2023-07-16 MED ORDER — OXYCODONE HCL 10 MG PO TABS
10.0000 mg | ORAL_TABLET | ORAL | 0 refills | Status: DC
  Filled 2023-08-08: qty 180, 30d supply, fill #0

## 2023-07-17 ENCOUNTER — Other Ambulatory Visit: Payer: Self-pay

## 2023-07-17 MED FILL — Lorazepam Tab 0.5 MG: ORAL | 30 days supply | Qty: 60 | Fill #1 | Status: AC

## 2023-07-17 MED FILL — Baclofen Tab 10 MG: ORAL | 20 days supply | Qty: 60 | Fill #2 | Status: AC

## 2023-07-20 ENCOUNTER — Other Ambulatory Visit: Payer: Self-pay

## 2023-07-20 ENCOUNTER — Encounter: Payer: Self-pay | Admitting: Oncology

## 2023-07-23 ENCOUNTER — Telehealth: Payer: Self-pay | Admitting: Urology

## 2023-07-23 ENCOUNTER — Other Ambulatory Visit: Payer: Self-pay | Admitting: Family

## 2023-07-23 ENCOUNTER — Other Ambulatory Visit: Payer: Self-pay

## 2023-07-23 MED ORDER — ROSUVASTATIN CALCIUM 5 MG PO TABS
5.0000 mg | ORAL_TABLET | Freq: Every day | ORAL | 0 refills | Status: DC
  Filled 2023-07-23: qty 90, 90d supply, fill #0

## 2023-07-23 MED FILL — Nicotine TD Patch 24HR 21 MG/24HR: TRANSDERMAL | 28 days supply | Qty: 28 | Fill #0 | Status: AC

## 2023-07-23 MED FILL — Fluoxetine HCl Cap 20 MG: ORAL | 90 days supply | Qty: 90 | Fill #1 | Status: AC

## 2023-07-23 NOTE — Telephone Encounter (Signed)
She has not read MyChart message.  [name], You still have microscopic blood in your urine.  We need to get you scheduled for a cystoscopy with Dr. Apolinar Junes to make sure there is nothing serious going on in your bladder.  Please call the office [phone]) or request an appointment through MyChart.   Nithya Meriweather, PA-C  Would you call her and read her the message and get her scheduled for cystoscopy with Dr. Apolinar Junes?

## 2023-07-23 NOTE — Telephone Encounter (Signed)
Pt read the message:  Seen by patient Tricia Berry on 07/11/2023  7:31 AM.

## 2023-07-27 ENCOUNTER — Inpatient Hospital Stay: Payer: Managed Care, Other (non HMO) | Attending: Oncology

## 2023-07-27 DIAGNOSIS — C50911 Malignant neoplasm of unspecified site of right female breast: Secondary | ICD-10-CM

## 2023-07-27 DIAGNOSIS — C50412 Malignant neoplasm of upper-outer quadrant of left female breast: Secondary | ICD-10-CM | POA: Diagnosis present

## 2023-07-28 LAB — ESTRADIOL: Estradiol: <5 pg/mL

## 2023-07-30 LAB — INHIBIN B: Inhibin B: <7 pg/mL

## 2023-08-07 ENCOUNTER — Other Ambulatory Visit: Payer: Self-pay

## 2023-08-07 ENCOUNTER — Ambulatory Visit: Payer: Managed Care, Other (non HMO) | Admitting: Family

## 2023-08-08 ENCOUNTER — Other Ambulatory Visit: Payer: Self-pay

## 2023-08-13 ENCOUNTER — Ambulatory Visit: Payer: No Typology Code available for payment source | Admitting: Radiation Oncology

## 2023-08-20 ENCOUNTER — Other Ambulatory Visit: Payer: Self-pay

## 2023-08-20 ENCOUNTER — Ambulatory Visit
Admission: RE | Admit: 2023-08-20 | Discharge: 2023-08-20 | Disposition: A | Discharge status: Home / Self Care | Payer: Managed Care, Other (non HMO) | Source: Ambulatory Visit | Attending: Radiation Oncology | Admitting: Radiation Oncology

## 2023-08-20 ENCOUNTER — Other Ambulatory Visit: Payer: Self-pay | Admitting: Family

## 2023-08-20 ENCOUNTER — Encounter: Payer: Self-pay | Admitting: Radiation Oncology

## 2023-08-20 ENCOUNTER — Other Ambulatory Visit: Payer: Self-pay | Admitting: Internal Medicine

## 2023-08-20 VITALS — BP 95/63 | HR 70 | Temp 97.0°F | Resp 14 | Ht 60.0 in | Wt 129.2 lb

## 2023-08-20 DIAGNOSIS — M545 Low back pain, unspecified: Secondary | ICD-10-CM

## 2023-08-20 DIAGNOSIS — C50412 Malignant neoplasm of upper-outer quadrant of left female breast: Secondary | ICD-10-CM | POA: Diagnosis present

## 2023-08-20 DIAGNOSIS — Z79811 Long term (current) use of aromatase inhibitors: Secondary | ICD-10-CM | POA: Insufficient documentation

## 2023-08-20 DIAGNOSIS — Z170 Estrogen receptor positive status [ER+]: Secondary | ICD-10-CM | POA: Insufficient documentation

## 2023-08-20 DIAGNOSIS — Z923 Personal history of irradiation: Secondary | ICD-10-CM | POA: Insufficient documentation

## 2023-08-20 MED ORDER — BACLOFEN 10 MG PO TABS
10.0000 mg | ORAL_TABLET | Freq: Three times a day (TID) | ORAL | 2 refills | Status: DC | PRN
  Filled 2023-08-20: qty 60, 20d supply, fill #0
  Filled 2023-11-05: qty 60, 20d supply, fill #1
  Filled 2023-12-26: qty 60, 20d supply, fill #2

## 2023-08-20 MED FILL — Nicotine TD Patch 24HR 21 MG/24HR: TRANSDERMAL | 28 days supply | Qty: 28 | Fill #1 | Status: AC

## 2023-08-20 MED FILL — Lorazepam Tab 0.5 MG: ORAL | 30 days supply | Qty: 60 | Fill #2 | Status: AC

## 2023-08-20 NOTE — Progress Notes (Signed)
Radiation Oncology Follow up Note  Name: Tricia Berry   Date:   08/20/2023 MRN:  [id number] DOB: 07-27-1971    This 52 y.o. female presents to the clinic today for 2-1/2-year follow-up status post whole breast radiation to her left breast for stage III (T2 N2 M0) ER/PR positive HER2 negative invasive mammary carcinoma status post neoadjuvant chemotherapy followed by bilateral mastectomies with reconstruction.Marland Kitchen  REFERRING PROVIDER: Allegra Grana, FNP  HPI: Patient is a 52 year old female now out almost close to 3 years having completed whole breast radiation to reconstructed left breast for stage III invasive mammary carcinoma ER/PR positive.  Seen today in routine follow-up she is doing well she states the area of implant in the left breast is somewhat tight at times she continues to do some physical therapy for that..  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  She had a CT scan back in May showing no pulmonary embolus she does have some peribronchial groundglass and consolidative airspace opacities most prominent right upper lobe.  She has another CT scan scheduled for tomorrow.  She has not been having mammograms based on her breast reconstruction.  She is currently on Arimidex tolerating that well without side effect.  COMPLICATIONS OF TREATMENT: none  FOLLOW UP COMPLIANCE: keeps appointments   PHYSICAL EXAM:  BP 95/63   Pulse 70   Temp (!) 97 F (36.1 C)   Resp 14   Ht 5' (1.524 m)   Wt 129 lb 3.2 oz (58.6 kg)   BMI 25.23 kg/m  Patient has reconstruction of both breasts no dominant mass or nodularities noted in either reconstructed breast no axillary or supraclavicular adenopathy is identified.  Well-developed well-nourished patient in NAD. HEENT reveals PERLA, EOMI, discs not visualized.  Oral cavity is clear. No oral mucosal lesions are  identified. Neck is clear without evidence of cervical or supraclavicular adenopathy. Lungs are clear to A&P. Cardiac examination is essentially unremarkable with regular rate and rhythm without murmur rub or thrill. Abdomen is benign with no organomegaly or masses noted. Motor sensory and DTR levels are equal and symmetric in the upper and lower extremities. Cranial nerves II through XII are grossly intact. Proprioception is intact. No peripheral adenopathy or edema is identified. No motor or sensory levels are noted. Crude visual fields are within normal range.  RADIOLOGY RESULTS: Angio CT scans as well as MRI scan reviewed compatible with above-stated findings  PLAN: At this time I will turn follow-up care over to medical oncology should she is close to 3 years out.  Not sure the necessity for mammograms and she is getting CT scans fairly regularly.  I be happy to reevaluate the patient anytime should that be indicated.  She continues on Arimidex without side effect.  I would like to take this opportunity to thank you for allowing me to participate in the care of your patient.Carmina Miller, MD

## 2023-08-21 ENCOUNTER — Other Ambulatory Visit: Payer: Self-pay

## 2023-08-21 ENCOUNTER — Ambulatory Visit
Admission: RE | Admit: 2023-08-21 | Discharge: 2023-08-21 | Disposition: A | Discharge status: Home / Self Care | Payer: Managed Care, Other (non HMO) | Source: Ambulatory Visit | Attending: Student in an Organized Health Care Education/Training Program | Admitting: Student in an Organized Health Care Education/Training Program

## 2023-08-21 DIAGNOSIS — T50905A Adverse effect of unspecified drugs, medicaments and biological substances, initial encounter: Secondary | ICD-10-CM

## 2023-08-21 MED FILL — Pregabalin Cap 150 MG: ORAL | 30 days supply | Qty: 90 | Fill #0 | Status: AC

## 2023-08-23 ENCOUNTER — Ambulatory Visit: Payer: Managed Care, Other (non HMO) | Admitting: Family

## 2023-08-23 ENCOUNTER — Encounter: Payer: Self-pay | Admitting: Family

## 2023-08-23 VITALS — BP 112/80 | HR 78 | Temp 98.5°F | Ht 60.0 in | Wt 128.2 lb

## 2023-08-23 DIAGNOSIS — R1084 Generalized abdominal pain: Secondary | ICD-10-CM | POA: Diagnosis not present

## 2023-08-23 DIAGNOSIS — Z23 Encounter for immunization: Secondary | ICD-10-CM

## 2023-08-23 DIAGNOSIS — M79641 Pain in right hand: Secondary | ICD-10-CM

## 2023-08-23 DIAGNOSIS — R1031 Right lower quadrant pain: Secondary | ICD-10-CM

## 2023-08-23 DIAGNOSIS — E785 Hyperlipidemia, unspecified: Secondary | ICD-10-CM

## 2023-08-23 NOTE — Assessment & Plan Note (Signed)
Negative  RF, ANA labs 01/20/22 Hand pain persistent despite conservative therapy.  Referral to orthopedics

## 2023-08-23 NOTE — Patient Instructions (Signed)
Referral to [facility], Dr. Stephenie Acres in regards to persistent hand pain I have ordered a ASAP CT abdomen and pelvis.  As discussed today, if abdominal pain were to worsen in any way, please present to emergency room for emergent evaluation  Please have lipid panel drawn with labs that hematology/oncology in December.  This is a fasting blood test

## 2023-08-23 NOTE — Progress Notes (Signed)
Assessment & Plan:  Right lower quadrant abdominal pain Assessment & Plan: Afebrile and nontoxic in appearance.  Patient does have more localized right abdominal pain.  Advised stat CT abdominal pelvis today.  Patient politely declines and would prefer to have image done in the next couple days.  She understands risk of missed diverticulitis of perforation and abscess.  we discussed constipation and advised to schedule MiraLAX twice per week indefinitely to regulate her bowels.   Need for influenza vaccination -     Flu vaccine trivalent PF, 6mos and older(Flulaval,Afluria,Fluarix,Fluzone)  Hand pain, right Assessment & Plan: Negative  RF, ANA labs 01/20/22 Hand pain persistent despite conservative therapy.  Referral to orthopedics  Orders: -     Ambulatory referral to Orthopedic Surgery  Generalized abdominal pain -     CT ABDOMEN PELVIS W CONTRAST; Future  Hyperlipidemia, unspecified hyperlipidemia type -     Lipid panel; Future     Return precautions given.   Risks, benefits, and alternatives of the medications and treatment plan prescribed today were discussed, and patient expressed understanding.   Education regarding symptom management and diagnosis given to patient on AVS either electronically or printed.  Return in about 3 months (around 11/23/2023).  Rennie Plowman, FNP  Subjective:    Patient ID: Tricia Berry, female    DOB: 1970-10-25, 52 y.o.   MRN: [id number]  CC: Tricia Berry is a 52 y.o. female who presents today for follow up.   HPI: Complains of right lower abdominal pain, occasionally on the left side, constant, worsening.  Onset with constipation x 1 week.  Pain will intensify , worse when having a BM, and at time she will feel sharp, stabbing pain.   Describes more constipated of late. She is taking miralax and stool softeners .  This morning she had brown loose stool.   Two weeks ago had isolated tmax 102. No recurrence of fever.    No V, chills, dysuria.     No h/o renal stone.   She reports a history of IBS  Right hand pain continues to bother her.    Colonoscopy 11/21/2021 diverticula present, no biopsies obtained   Pending follow-up CT chest results as obtained 08/21/23 and ordered by Dr Aundria Rud  Negative  RF, ANA 01/20/22   Allergies: Morphine, Sertraline hcl, Sulfa antibiotics, Sulfamethoxazole, and Sulfonamide derivatives Current Outpatient Medications on File Prior to Visit  Medication Sig Dispense Refill   acetaminophen (TYLENOL) 650 MG CR tablet Take 1,300 mg by mouth every 8 (eight) hours as needed for pain.     albuterol (PROVENTIL) (2.5 MG/3ML) 0.083% nebulizer solution Take 3 mLs (2.5 mg total) by nebulization every 6 (six) hours as needed for wheezing or shortness of breath. 360 mL 12   albuterol (VENTOLIN HFA) 108 (90 Base) MCG/ACT inhaler Inhale 2 puffs into the lungs every 6 (six) hours as needed for wheezing or shortness of breath. 6.7 g 0   anastrozole (ARIMIDEX) 1 MG tablet Take 1 tablet (1 mg total) by mouth daily. 30 tablet 5   b complex vitamins capsule Take 1 capsule by mouth daily.     baclofen (LIORESAL) 10 MG tablet Take 1 tablet (10 mg total) by mouth 3 (three) times daily as needed for muscle spasms. 60 tablet 2   Blood Glucose Monitoring Suppl (BLOOD GLUCOSE MONITOR SYSTEM) w/Device KIT Test in the morning, at noon, and at bedtime. 1 kit 0   calcium-vitamin D (OSCAL WITH D) 500-5 MG-MCG tablet Take 1  tablet by mouth daily with breakfast.     cyanocobalamin (VITAMIN B12) 1000 MCG/ML injection Inject 1 mL (1,000 mcg total) into the muscle every 30 (thirty) days. 3 mL 4   dextromethorphan-guaiFENesin (ROBITUSSIN-DM) 10-100 MG/5ML liquid Take 10 mLs by mouth every 4 (four) hours as needed for cough.     docusate sodium (COLACE) 100 MG capsule Take 100 mg by mouth daily as needed for mild constipation.     FLUoxetine (PROZAC) 20 MG capsule Take 1 capsule (20 mg total) by mouth every  morning. 90 capsule 3   gentamicin ointment (GARAMYCIN) 0.1 % Apply to nose 3 (three) times daily. 30 g 12   ibuprofen (ADVIL) 400 MG tablet Take 400 mg by mouth every 6 (six) hours as needed.     Lactobacillus (PROBIOTIC ACIDOPHILUS PO) Take 1 capsule by mouth daily.     LORazepam (ATIVAN) 0.5 MG tablet Take 1 tablet (0.5 mg total) by mouth 2 (two) times daily as needed for anxiety. 60 tablet 2   nicotine (NICODERM CQ - DOSED IN MG/24 HOURS) 21 mg/24hr patch Place 1 patch (21 mg total) onto the skin daily. 28 patch 1   Oxycodone HCl 10 MG TABS Take 1 tablet (10mg ) by mouth once every four hours as needed for pain. Stop Percocet due to headaches. 180 tablet 0   pregabalin (LYRICA) 150 MG capsule Take 1 capsule (150 mg total) by mouth in the morning, at noon, and at bedtime. 90 capsule 2   rosuvastatin (CRESTOR) 5 MG tablet Take 1 tablet (5 mg total) by mouth daily. 90 tablet 0   trimethoprim (TRIMPEX) 100 MG tablet Take 1 tablet (100 mg total) by mouth daily. 90 tablet 3   umeclidinium-vilanterol (ANORO ELLIPTA) 62.5-25 MCG/ACT AEPB Inhale 1 puff into the lungs daily. 60 each 6   vitamin B-12 (CYANOCOBALAMIN) 100 MCG tablet Take 100 mcg by mouth daily. gummy     Current Facility-Administered Medications on File Prior to Visit  Medication Dose Route Frequency Provider Last Rate Last Admin   acetaminophen (TYLENOL) 325 MG tablet            diphenhydrAMINE (BENADRYL) 25 mg capsule            goserelin (ZOLADEX) injection 3.6 mg  3.6 mg Subcutaneous Q28 days Creig Hines, MD   3.6 mg at 02/02/23 1206   heparin lock flush 100 unit/mL  500 Units Intravenous Once Creig Hines, MD       Zoledronic Acid (ZOMETA) 4 MG/100ML IVPB             Review of Systems  Constitutional:  Negative for chills and fever.  Respiratory:  Negative for cough.   Cardiovascular:  Negative for chest pain and palpitations.  Gastrointestinal:  Positive for abdominal pain and constipation. Negative for nausea and  vomiting.      Objective:    BP 112/80   Pulse 78   Temp 98.5 F (36.9 C) (Oral)   Ht 5' (1.524 m)   Wt 128 lb 3.2 oz (58.2 kg)   SpO2 96%   BMI 25.04 kg/m  BP Readings from Last 3 Encounters:  08/23/23 112/80  08/20/23 95/63  06/01/23 (!) 110/93   Wt Readings from Last 3 Encounters:  08/23/23 128 lb 3.2 oz (58.2 kg)  08/20/23 129 lb 3.2 oz (58.6 kg)  06/01/23 132 lb (59.9 kg)    Physical Exam Vitals reviewed.  Constitutional:      Appearance: Normal appearance. She is well-developed.  Eyes:     Conjunctiva/sclera: Conjunctivae normal.  Cardiovascular:     Rate and Rhythm: Normal rate and regular rhythm.     Pulses: Normal pulses.     Heart sounds: Normal heart sounds.  Pulmonary:     Effort: Pulmonary effort is normal.     Breath sounds: Normal breath sounds. No wheezing, rhonchi or rales.  Abdominal:     General: Bowel sounds are normal. There is no distension.     Palpations: Abdomen is soft. Abdomen is not rigid. There is no fluid wave or mass.     Tenderness: There is abdominal tenderness in the right lower quadrant. There is no right CVA tenderness, left CVA tenderness, guarding or rebound.     Comments: Nonexquisite diffuse tenderness right lower abdomen.  Patient is not guarding.  Skin:    General: Skin is warm and dry.  Neurological:     Mental Status: She is alert.  Psychiatric:        Speech: Speech normal.        Behavior: Behavior normal.        Thought Content: Thought content normal.

## 2023-08-23 NOTE — Assessment & Plan Note (Signed)
Afebrile and nontoxic in appearance.  Patient does have more localized right abdominal pain.  Advised stat CT abdominal pelvis today.  Patient politely declines and would prefer to have image done in the next couple days.  She understands risk of missed diverticulitis of perforation and abscess.  we discussed constipation and advised to schedule MiraLAX twice per week indefinitely to regulate her bowels.

## 2023-08-30 ENCOUNTER — Ambulatory Visit: Payer: Commercial Managed Care - HMO | Admitting: Student in an Organized Health Care Education/Training Program

## 2023-08-31 ENCOUNTER — Ambulatory Visit
Admission: RE | Admit: 2023-08-31 | Discharge: 2023-08-31 | Disposition: A | Discharge status: Home / Self Care | Payer: Commercial Managed Care - HMO | Source: Ambulatory Visit | Attending: Family | Admitting: Family

## 2023-08-31 DIAGNOSIS — R1084 Generalized abdominal pain: Secondary | ICD-10-CM | POA: Insufficient documentation

## 2023-08-31 MED ORDER — IOHEXOL 300 MG/ML  SOLN
85.0000 mL | Freq: Once | INTRAMUSCULAR | Status: AC | PRN
  Administered 2023-08-31: 85 mL via INTRAVENOUS

## 2023-08-31 MED ORDER — IOHEXOL 9 MG/ML PO SOLN
500.0000 mL | ORAL | Status: AC
  Administered 2023-08-31 (×2): 500 mL via ORAL

## 2023-09-03 ENCOUNTER — Other Ambulatory Visit: Payer: Self-pay

## 2023-09-03 DIAGNOSIS — N182 Chronic kidney disease, stage 2 (mild): Secondary | ICD-10-CM

## 2023-09-05 ENCOUNTER — Telehealth: Payer: Self-pay

## 2023-09-05 NOTE — Telephone Encounter (Signed)
LVM to inform pt even though she has seen results ..lipid panel ordered she needs to  schedule labs in the next couple of weeks Is she agreeable to increasing Crestor from 5 mg to 10 mg? If she has please send in Crestor 10 mg po at bedtime for 90 days with refill   Please then sch lab ANOTHER lab appt for 8 weeks after increased crestor so we can recheck hepatic enzymes

## 2023-09-06 ENCOUNTER — Other Ambulatory Visit: Payer: Self-pay

## 2023-09-06 MED ORDER — OXYCODONE HCL 10 MG PO TABS
10.0000 mg | ORAL_TABLET | ORAL | 0 refills | Status: DC | PRN
  Filled 2023-09-06: qty 180, 30d supply, fill #0

## 2023-09-10 ENCOUNTER — Telehealth: Payer: Self-pay

## 2023-09-10 NOTE — Telephone Encounter (Signed)
 LVM to inform pt even though she has seen results ..lipid panel ordered she needs to  schedule labs in the next couple of weeks Is she agreeable to increasing Crestor from 5 mg to 10 mg? If she has please send in Crestor 10 mg po at bedtime for 90 days with refill   Please then sch lab ANOTHER lab appt for 8 weeks after increased crestor so we can recheck hepatic enzymes

## 2023-09-11 ENCOUNTER — Other Ambulatory Visit: Payer: Self-pay

## 2023-09-11 ENCOUNTER — Ambulatory Visit: Payer: Commercial Managed Care - HMO | Attending: Student in an Organized Health Care Education/Training Program

## 2023-09-11 DIAGNOSIS — J704 Drug-induced interstitial lung disorders, unspecified: Secondary | ICD-10-CM | POA: Diagnosis present

## 2023-09-11 DIAGNOSIS — T50905A Adverse effect of unspecified drugs, medicaments and biological substances, initial encounter: Secondary | ICD-10-CM

## 2023-09-11 DIAGNOSIS — J984 Other disorders of lung: Secondary | ICD-10-CM | POA: Diagnosis not present

## 2023-09-11 DIAGNOSIS — F17210 Nicotine dependence, cigarettes, uncomplicated: Secondary | ICD-10-CM | POA: Insufficient documentation

## 2023-09-11 LAB — PULMONARY FUNCTION TEST ARMC ONLY
DL/VA % pred: 66 %
DL/VA: 2.93 ml/min/mmHg/L
DLCO unc % pred: 37 %
DLCO unc: 6.71 ml/min/mmHg
FEF 25-75 Pre: 1.17 L/s
FEF2575-%Pred-Pre: 48 %
FEV1-%Pred-Pre: 54 %
FEV1-Pre: 1.29 L
FEV1FVC-%Pred-Pre: 95 %
FEV6-%Pred-Pre: 58 %
FEV6-Pre: 1.7 L
FEV6FVC-%Pred-Pre: 103 %
FVC-%Pred-Pre: 56 %
FVC-Pre: 1.7 L
Pre FEV1/FVC ratio: 76 %
Pre FEV6/FVC Ratio: 100 %
RV % pred: 51 %
RV: 0.85 L
TLC % pred: 53 %
TLC: 2.37 L

## 2023-09-11 MED ORDER — OXYCODONE HCL 10 MG PO TABS
10.0000 mg | ORAL_TABLET | ORAL | 0 refills | Status: DC | PRN
  Filled 2023-10-05: qty 180, 30d supply, fill #0

## 2023-09-11 MED ORDER — ROSUVASTATIN CALCIUM 10 MG PO TABS
10.0000 mg | ORAL_TABLET | Freq: Every day | ORAL | 3 refills | Status: DC
  Filled 2023-09-11 – 2023-09-24 (×2): qty 90, 90d supply, fill #0

## 2023-09-11 MED ORDER — ALBUTEROL SULFATE (2.5 MG/3ML) 0.083% IN NEBU
2.5000 mg | INHALATION_SOLUTION | Freq: Once | RESPIRATORY_TRACT | Status: AC
  Administered 2023-09-11: 2.5 mg via RESPIRATORY_TRACT
  Filled 2023-09-11: qty 3

## 2023-09-11 NOTE — Telephone Encounter (Signed)
Patient just called back and I read her the message. She said she agree for the Crestor to be increased from 5 mg to 10 mg. She said she will call back to schedule the lab appointment.

## 2023-09-11 NOTE — Addendum Note (Signed)
Addended by: [place], Emmaline Wahba on: 09/11/2023 11:32 AM   Modules accepted: Orders

## 2023-09-12 ENCOUNTER — Ambulatory Visit
Admission: RE | Admit: 2023-09-12 | Discharge: 2023-09-12 | Disposition: A | Discharge status: Home / Self Care | Payer: Managed Care, Other (non HMO) | Source: Ambulatory Visit | Attending: Student in an Organized Health Care Education/Training Program

## 2023-09-12 ENCOUNTER — Encounter: Payer: Self-pay | Admitting: Student in an Organized Health Care Education/Training Program

## 2023-09-12 ENCOUNTER — Ambulatory Visit
Admission: RE | Admit: 2023-09-12 | Discharge: 2023-09-12 | Disposition: A | Discharge status: Home / Self Care | Payer: Managed Care, Other (non HMO) | Source: Ambulatory Visit | Attending: Student in an Organized Health Care Education/Training Program | Admitting: Student in an Organized Health Care Education/Training Program

## 2023-09-12 ENCOUNTER — Ambulatory Visit: Payer: Managed Care, Other (non HMO) | Admitting: Student in an Organized Health Care Education/Training Program

## 2023-09-12 VITALS — BP 128/80 | HR 74 | Temp 97.7°F | Ht 60.0 in | Wt 129.4 lb

## 2023-09-12 DIAGNOSIS — T50905A Adverse effect of unspecified drugs, medicaments and biological substances, initial encounter: Secondary | ICD-10-CM

## 2023-09-12 DIAGNOSIS — R0602 Shortness of breath: Secondary | ICD-10-CM | POA: Insufficient documentation

## 2023-09-12 DIAGNOSIS — J9611 Chronic respiratory failure with hypoxia: Secondary | ICD-10-CM

## 2023-09-12 DIAGNOSIS — J984 Other disorders of lung: Secondary | ICD-10-CM | POA: Diagnosis not present

## 2023-09-12 NOTE — Progress Notes (Unsigned)
Synopsis: Referred in *** by Allegra Grana, FNP  Assessment & Plan:   #Shortness of breath #Chronic hypoxic respiratory failure [place]) #Drug-induced pneumonitis   She had presented to clinic for the evaluation of shortness of breath in the setting of known breast cancer s/p chemotherapy, resection, and immune therapy. She received Abemaciclib last year which was discontinued in November of 2023. She is now s/p bronchoscopy with BAL, with all infectious workup returning negative.   Her initial CT showed multi-focal infiltrates with ground glass and consolidative opacities. My differential includes inflammatory conditions such as drug induced pneumonitis from abemaciclib, organizing pneumonia, hypersensitivity pneumonitis given mold exposure at home, and DIP/Desquamative Interstitial Pneumonia given smoking. Abemaciclib could have a sustained effect with pneumonitis. Hypersensitivity pneumonitis, organizing pneumonia, and DIP are higher on my differential, however.   Repeat CT scan in march showed significant improvement in infiltrates, and we proceeded with taper down to 20 mg of prednisone. She unfortunately exacerbated in May of 2024 which was secondary to para-influenza infection. Given clinical improvement, I will proceed with further tapering of the prednisone to 10 mg daily for the next 6 weeks followed by 2 weeks of 5 mg of prednisone daily before fully discontinuing it.   I will then get a repeat high resolution chest CT and pulmonary function testing one month after steroid discontinuation and will see the patient for follow up then.   - DG Chest 2 View; Future  Overall has felt well over the summer, taper down her prednisone and discontinued this in September.  Was feeling okay with some significant exertional dyspnea but otherwise at baseline.  On Sunday, developed an increase in shortness of breath cough and malaise probably secondary to a viral illness.  Presenting for  follow-up, is on room air.  Reviewed imaging, high-resolution chest CT shows fibrotic process where there was groundglass, no active inflammation at this point, pulmonary function testing reviewed, DLCO and TLC significantly reduced, no sign of obstruction.  Continue with Anoro, continue with oxygen with exertion, recommended vaccinations, avoid smoking (smokes 1 cigarette every once in a while, compliant with patches and lozenges).  Follow-up in 3 months.  Likely new baseline.  Return in about 3 months (around 12/13/2023).  I spent *** minutes caring for this patient today, including {EM billing:[id number]}  Raechel Chute, MD [facility] 09/12/2023 10:20 AM    End of visit medications:  No orders of the defined types were placed in this encounter.    Current Outpatient Medications:    acetaminophen (TYLENOL) 650 MG CR tablet, Take 1,300 mg by mouth every 8 (eight) hours as needed for pain., Disp: , Rfl:    albuterol (PROVENTIL) (2.5 MG/3ML) 0.083% nebulizer solution, Take 3 mLs (2.5 mg total) by nebulization every 6 (six) hours as needed for wheezing or shortness of breath., Disp: 360 mL, Rfl: 12   albuterol (VENTOLIN HFA) 108 (90 Base) MCG/ACT inhaler, Inhale 2 puffs into the lungs every 6 (six) hours as needed for wheezing or shortness of breath., Disp: 6.7 g, Rfl: 0   anastrozole (ARIMIDEX) 1 MG tablet, Take 1 tablet (1 mg total) by mouth daily., Disp: 30 tablet, Rfl: 5   b complex vitamins capsule, Take 1 capsule by mouth daily., Disp: , Rfl:    baclofen (LIORESAL) 10 MG tablet, Take 1 tablet (10 mg total) by mouth 3 (three) times daily as needed for muscle spasms., Disp: 60 tablet, Rfl: 2   Blood Glucose Monitoring Suppl (BLOOD GLUCOSE MONITOR SYSTEM) w/Device  KIT, Test in the morning, at noon, and at bedtime., Disp: 1 kit, Rfl: 0   calcium-vitamin D (OSCAL WITH D) 500-5 MG-MCG tablet, Take 1 tablet by mouth daily with breakfast., Disp: , Rfl:    cyanocobalamin  (VITAMIN B12) 1000 MCG/ML injection, Inject 1 mL (1,000 mcg total) into the muscle every 30 (thirty) days., Disp: 3 mL, Rfl: 4   dextromethorphan-guaiFENesin (ROBITUSSIN-DM) 10-100 MG/5ML liquid, Take 10 mLs by mouth every 4 (four) hours as needed for cough., Disp: , Rfl:    docusate sodium (COLACE) 100 MG capsule, Take 100 mg by mouth daily as needed for mild constipation., Disp: , Rfl:    FLUoxetine (PROZAC) 20 MG capsule, Take 1 capsule (20 mg total) by mouth every morning., Disp: 90 capsule, Rfl: 3   gentamicin ointment (GARAMYCIN) 0.1 %, Apply to nose 3 (three) times daily., Disp: 30 g, Rfl: 12   ibuprofen (ADVIL) 400 MG tablet, Take 400 mg by mouth every 6 (six) hours as needed., Disp: , Rfl:    Lactobacillus (PROBIOTIC ACIDOPHILUS PO), Take 1 capsule by mouth daily., Disp: , Rfl:    LORazepam (ATIVAN) 0.5 MG tablet, Take 1 tablet (0.5 mg total) by mouth 2 (two) times daily as needed for anxiety., Disp: 60 tablet, Rfl: 2   nicotine (NICODERM CQ - DOSED IN MG/24 HOURS) 21 mg/24hr patch, Place 1 patch (21 mg total) onto the skin daily., Disp: 28 patch, Rfl: 1   Oxycodone HCl 10 MG TABS, Take 1 tablet (10mg ) by mouth once every four hours as needed for pain. Stop Percocet due to headaches., Disp: 180 tablet, Rfl: 0   Oxycodone HCl 10 MG TABS, Take 1 tablet (10 mg total) by mouth every 4 (four) hours as needed for pain., Disp: 180 tablet, Rfl: 0   [START ON 10/05/2023] Oxycodone HCl 10 MG TABS, Take 1 tablet (10 mg total) by mouth every 4 (four) hours as needed for pain., Disp: 180 tablet, Rfl: 0   pregabalin (LYRICA) 150 MG capsule, Take 1 capsule (150 mg total) by mouth in the morning, at noon, and at bedtime., Disp: 90 capsule, Rfl: 2   rosuvastatin (CRESTOR) 10 MG tablet, Take 1 tablet (10 mg total) by mouth daily., Disp: 90 tablet, Rfl: 3   trimethoprim (TRIMPEX) 100 MG tablet, Take 1 tablet (100 mg total) by mouth daily., Disp: 90 tablet, Rfl: 3   umeclidinium-vilanterol (ANORO ELLIPTA)  62.5-25 MCG/ACT AEPB, Inhale 1 puff into the lungs daily., Disp: 60 each, Rfl: 6   vitamin B-12 (CYANOCOBALAMIN) 100 MCG tablet, Take 100 mcg by mouth daily. gummy, Disp: , Rfl:  No current facility-administered medications for this visit.  Facility-Administered Medications Ordered in Other Visits:    acetaminophen (TYLENOL) 325 MG tablet, , , ,    diphenhydrAMINE (BENADRYL) 25 mg capsule, , , ,    goserelin (ZOLADEX) injection 3.6 mg, 3.6 mg, Subcutaneous, Q28 days, Creig Hines, MD, 3.6 mg at 02/02/23 1206   heparin lock flush 100 unit/mL, 500 Units, Intravenous, Once, Creig Hines, MD   Zoledronic Acid (ZOMETA) 4 MG/100ML IVPB, , , ,    Subjective:   PATIENT ID: Tricia Berry GENDER: female DOB: [birth year], MRN: [id number]  Chief Complaint  Patient presents with   Follow-up    Chest congestion, throat clearing and increased oxygen usage since Sunday.     HPI ***  Ancillary information including prior medications, full medical/surgical/family/social histories, and PFTs (when available) are listed below and have been reviewed.   ROS  Objective:   Vitals:   09/12/23 1002  BP: 128/80  Pulse: 74  Temp: 97.7 F (36.5 C)  TempSrc: Temporal  SpO2: 90%  Weight: 129 lb 6.4 oz (58.7 kg)  Height: 5' (1.524 m)   90% on *** LPM *** RA BMI Readings from Last 3 Encounters:  09/12/23 25.27 kg/m  08/23/23 25.04 kg/m  08/20/23 25.23 kg/m   Wt Readings from Last 3 Encounters:  09/12/23 129 lb 6.4 oz (58.7 kg)  08/23/23 128 lb 3.2 oz (58.2 kg)  08/20/23 129 lb 3.2 oz (58.6 kg)    Physical Exam    Ancillary Information    Past Medical History:  Diagnosis Date   Anemia    Anginal pain (HCC)    excertion not cardiac related per patient   Anxiety    Breast cancer (HCC) 12/2021   left breast IMC   Diverticulitis    Dyspnea    Dysrhythmia    prolong QT   Family history of ovarian cancer    GERD (gastroesophageal reflux disease)    Headache    IBS  (irritable bowel syndrome)    Neuromuscular disorder (HCC)    neuropathy from chemo   Pneumonia    PONV (postoperative nausea and vomiting)    severe migraine and vomiting post anesthesia   Scoliosis      Family History  Problem Relation Age of Onset   Hypertension Mother    Osteoarthritis Mother    Diverticulitis Mother    Heart failure Father    Hypertension Father    Gout Father    Heart attack Father 73   Diverticulitis Brother    Ovarian cancer Paternal Grandmother      Past Surgical History:  Procedure Laterality Date   ABDOMINAL HYSTERECTOMY     still has ovaries, no gyn cancer, hysterectomy due to endometriosis. NO cervix on exam 01/10/21   BACK SURGERY     BREAST BIOPSY Left 09/15/2020   [place] bx of mass, path pending, Q marker   BREAST BIOPSY Left 09/15/2020   [place] bx of LN, hydromarker, path pending   BREAST RECONSTRUCTION WITH PLACEMENT OF TISSUE EXPANDER AND FLEX HD (ACELLULAR HYDRATED DERMIS) Bilateral 03/17/2021   Procedure: IMMEDIATE BILATERAL BREAST RECONSTRUCTION WITH PLACEMENT OF TISSUE EXPANDER AND FLEX HD (ACELLULAR HYDRATED DERMIS);  Surgeon: Peggye Form, DO;  Location: [facility];  Service: Plastics;  Laterality: Bilateral;   CAPSULECTOMY Right 10/18/2022   Procedure: Right breast capsule release with adjustment;  Surgeon: Peggye Form, DO;  Location: [facility];  Service: Plastics;  Laterality: Right;   COLONOSCOPY WITH PROPOFOL N/A 11/21/2021   Procedure: COLONOSCOPY WITH PROPOFOL;  Surgeon: Toney Reil, MD;  [facility];  Service: Gastroenterology;  Laterality: N/A;   FLEXIBLE BRONCHOSCOPY Bilateral 12/01/2022   Procedure: FLEXIBLE BRONCHOSCOPY;  Surgeon: Raechel Chute, MD;  Location: [facility];  Service: Pulmonary;  Laterality: Bilateral;   IR IMAGING GUIDED PORT INSERTION  10/01/2020   MODIFIED MASTECTOMY Left 03/17/2021   Procedure: LEFT MODIFIED RADICAL MASTECTOMY;  Surgeon: Harriette Bouillon, MD;  Location: [facility];  Service: General;  Laterality: Left;   PORTA CATH REMOVAL Right 03/17/2021   Procedure: PORTA CATH REMOVAL;  Surgeon: Harriette Bouillon, MD;  Location: [facility];  Service: General;  Laterality: Right;   REMOVAL OF BILATERAL TISSUE EXPANDERS WITH PLACEMENT OF BILATERAL BREAST IMPLANTS Bilateral 05/09/2021   Procedure: REMOVAL OF BILATERAL TISSUE EXPANDERS WITH PLACEMENT OF BILATERAL BREAST IMPLANTS;  Surgeon:  Dillingham, Alena Bills, DO;  Location: [facility];  Service: Plastics;  Laterality: Bilateral;  90 min   TOTAL MASTECTOMY Right 03/17/2021   Procedure: TOTAL MASTECTOMY;  Surgeon: Harriette Bouillon, MD;  Location: [facility];  Service: General;  Laterality: Right;    Social History   Socioeconomic History   Marital status: Significant Other    Spouse name: Not on file   Number of children: 2   Years of education: Not on file   Highest education level: Not on file  Occupational History   Occupation: Nurse    Employer: [employer]  Tobacco Use   Smoking status: Every Day    Current packs/day: 1.00    Average packs/day: 1 pack/day for 20.0 years (20.0 ttl pk-yrs)    Types: Cigarettes   Smokeless tobacco: Never   Tobacco comments:    1-2 cigarettes weekly- 09/12/2023  Vaping Use   Vaping status: Never Used  Substance and Sexual Activity   Alcohol use: Not Currently   Drug use: No   Sexual activity: Not Currently    Birth control/protection: Surgical    Comment: hyst  Other Topics Concern   Not on file  Social History Narrative   Patient works as an Insurance underwriter at [employer]. She has 2 children at home who have special needs. She and her spouse are primary caregivers.    Social Determinants of Health   Financial Resource Strain: Not on file  Food Insecurity: No Food Insecurity (03/21/2023)   Hunger Vital Sign    Worried About Running Out of Food in the Last Year: Never true    Ran Out of  Food in the Last Year: Never true  Transportation Needs: No Transportation Needs (03/21/2023)   PRAPARE - Administrator, Civil Service (Medical): No    Lack of Transportation (Non-Medical): No  Physical Activity: Not on file  Stress: Not on file  Social Connections: Not on file  Intimate Partner Violence: Not At Risk (03/21/2023)   Humiliation, Afraid, Rape, and Kick questionnaire    Fear of Current or Ex-Partner: No    Emotionally Abused: No    Physically Abused: No    Sexually Abused: No     Allergies  Allergen Reactions   Morphine Nausea And Vomiting and Other (See Comments)    migranes Other reaction(s): Headache Migraine, vomiting   Sertraline Hcl     REACTION: Worsened symptoms of IBS   Sulfa Antibiotics Rash   Sulfamethoxazole Rash   Sulfonamide Derivatives Rash     CBC    Component Value Date/Time   WBC 9.2 06/01/2023 1051   RBC 5.12 (H) 06/01/2023 1051   HGB 14.7 06/01/2023 1051   HGB 14.3 08/23/2020 1033   HCT 46.0 06/01/2023 1051   HCT 42.9 08/23/2020 1033   PLT 216 06/01/2023 1051   PLT 222 08/23/2020 1033   MCV 89.8 06/01/2023 1051   MCV 91 08/23/2020 1033   MCH 28.7 06/01/2023 1051   MCHC 32.0 06/01/2023 1051   RDW 17.1 (H) 06/01/2023 1051   RDW 13.1 08/23/2020 1033   LYMPHSABS 2.4 06/01/2023 1051   LYMPHSABS 4.2 (H) 08/23/2020 1033   MONOABS 0.8 06/01/2023 1051   EOSABS 0.1 06/01/2023 1051   EOSABS 0.1 08/23/2020 1033   BASOSABS 0.1 06/01/2023 1051   BASOSABS 0.1 08/23/2020 1033    Pulmonary Functions Testing Results:    Latest Ref Rng & Units 09/11/2023    9:41 AM 12/07/2022    1:58  PM  PFT Results  FVC-Pre L 1.70  1.95   FVC-Predicted Pre % 56  64   FVC-Post L  1.94   FVC-Predicted Post %  64   Pre FEV1/FVC % % 76  64   Post FEV1/FCV % %  60   FEV1-Pre L 1.29  1.24   FEV1-Predicted Pre % 54  52   FEV1-Post L  1.17   DLCO uncorrected ml/min/mmHg 6.71  6.69   DLCO UNC% % 37  36   DLVA Predicted % 66  58   TLC L 2.37   1.57   TLC % Predicted % 53  35   RV % Predicted % 51  -17     Outpatient Medications Prior to Visit  Medication Sig Dispense Refill   acetaminophen (TYLENOL) 650 MG CR tablet Take 1,300 mg by mouth every 8 (eight) hours as needed for pain.     albuterol (PROVENTIL) (2.5 MG/3ML) 0.083% nebulizer solution Take 3 mLs (2.5 mg total) by nebulization every 6 (six) hours as needed for wheezing or shortness of breath. 360 mL 12   albuterol (VENTOLIN HFA) 108 (90 Base) MCG/ACT inhaler Inhale 2 puffs into the lungs every 6 (six) hours as needed for wheezing or shortness of breath. 6.7 g 0   anastrozole (ARIMIDEX) 1 MG tablet Take 1 tablet (1 mg total) by mouth daily. 30 tablet 5   b complex vitamins capsule Take 1 capsule by mouth daily.     baclofen (LIORESAL) 10 MG tablet Take 1 tablet (10 mg total) by mouth 3 (three) times daily as needed for muscle spasms. 60 tablet 2   Blood Glucose Monitoring Suppl (BLOOD GLUCOSE MONITOR SYSTEM) w/Device KIT Test in the morning, at noon, and at bedtime. 1 kit 0   calcium-vitamin D (OSCAL WITH D) 500-5 MG-MCG tablet Take 1 tablet by mouth daily with breakfast.     cyanocobalamin (VITAMIN B12) 1000 MCG/ML injection Inject 1 mL (1,000 mcg total) into the muscle every 30 (thirty) days. 3 mL 4   dextromethorphan-guaiFENesin (ROBITUSSIN-DM) 10-100 MG/5ML liquid Take 10 mLs by mouth every 4 (four) hours as needed for cough.     docusate sodium (COLACE) 100 MG capsule Take 100 mg by mouth daily as needed for mild constipation.     FLUoxetine (PROZAC) 20 MG capsule Take 1 capsule (20 mg total) by mouth every morning. 90 capsule 3   gentamicin ointment (GARAMYCIN) 0.1 % Apply to nose 3 (three) times daily. 30 g 12   ibuprofen (ADVIL) 400 MG tablet Take 400 mg by mouth every 6 (six) hours as needed.     Lactobacillus (PROBIOTIC ACIDOPHILUS PO) Take 1 capsule by mouth daily.     LORazepam (ATIVAN) 0.5 MG tablet Take 1 tablet (0.5 mg total) by mouth 2 (two) times daily as  needed for anxiety. 60 tablet 2   nicotine (NICODERM CQ - DOSED IN MG/24 HOURS) 21 mg/24hr patch Place 1 patch (21 mg total) onto the skin daily. 28 patch 1   Oxycodone HCl 10 MG TABS Take 1 tablet (10mg ) by mouth once every four hours as needed for pain. Stop Percocet due to headaches. 180 tablet 0   Oxycodone HCl 10 MG TABS Take 1 tablet (10 mg total) by mouth every 4 (four) hours as needed for pain. 180 tablet 0   [START ON 10/05/2023] Oxycodone HCl 10 MG TABS Take 1 tablet (10 mg total) by mouth every 4 (four) hours as needed for pain. 180 tablet 0  pregabalin (LYRICA) 150 MG capsule Take 1 capsule (150 mg total) by mouth in the morning, at noon, and at bedtime. 90 capsule 2   rosuvastatin (CRESTOR) 10 MG tablet Take 1 tablet (10 mg total) by mouth daily. 90 tablet 3   trimethoprim (TRIMPEX) 100 MG tablet Take 1 tablet (100 mg total) by mouth daily. 90 tablet 3   umeclidinium-vilanterol (ANORO ELLIPTA) 62.5-25 MCG/ACT AEPB Inhale 1 puff into the lungs daily. 60 each 6   vitamin B-12 (CYANOCOBALAMIN) 100 MCG tablet Take 100 mcg by mouth daily. gummy     Facility-Administered Medications Prior to Visit  Medication Dose Route Frequency Provider Last Rate Last Admin   acetaminophen (TYLENOL) 325 MG tablet            diphenhydrAMINE (BENADRYL) 25 mg capsule            goserelin (ZOLADEX) injection 3.6 mg  3.6 mg Subcutaneous Q28 days Creig Hines, MD   3.6 mg at 02/02/23 1206   heparin lock flush 100 unit/mL  500 Units Intravenous Once Creig Hines, MD       Zoledronic Acid (ZOMETA) 4 MG/100ML IVPB

## 2023-09-13 ENCOUNTER — Other Ambulatory Visit: Payer: Self-pay

## 2023-09-13 ENCOUNTER — Encounter: Payer: Self-pay | Admitting: Student in an Organized Health Care Education/Training Program

## 2023-09-13 ENCOUNTER — Telehealth: Payer: Self-pay | Admitting: Urology

## 2023-09-13 MED ORDER — MELOXICAM 15 MG PO TABS
15.0000 mg | ORAL_TABLET | Freq: Every day | ORAL | 2 refills | Status: DC
  Filled 2023-09-13 – 2023-09-24 (×2): qty 30, 30d supply, fill #0

## 2023-09-13 NOTE — Telephone Encounter (Signed)
-----   Message from [facility] sent at 09/11/2023  8:29 AM EST ----- I sent her a MyChart message regarding the next step that was to get her scheduled for a cystoscopy with Dr. Apolinar Junes.  It looks like she read it, but she has not been scheduled for the cysto.  Will you schedule her for the cysto?  It will be to evaluate for micro heme.

## 2023-09-13 NOTE — Telephone Encounter (Signed)
Left pt another message to call office and have cysto scheduled with Dr Apolinar Junes.

## 2023-09-13 NOTE — Telephone Encounter (Signed)
PFT was 11/18.

## 2023-09-19 ENCOUNTER — Other Ambulatory Visit: Payer: Self-pay | Admitting: Family

## 2023-09-19 ENCOUNTER — Other Ambulatory Visit: Payer: Self-pay

## 2023-09-19 ENCOUNTER — Other Ambulatory Visit: Payer: Self-pay | Admitting: Oncology

## 2023-09-19 DIAGNOSIS — F411 Generalized anxiety disorder: Secondary | ICD-10-CM

## 2023-09-19 MED FILL — Pregabalin Cap 150 MG: ORAL | 30 days supply | Qty: 90 | Fill #1 | Status: AC

## 2023-09-20 ENCOUNTER — Encounter: Payer: Self-pay | Admitting: Oncology

## 2023-09-23 ENCOUNTER — Other Ambulatory Visit: Payer: Self-pay

## 2023-09-24 ENCOUNTER — Other Ambulatory Visit: Payer: Self-pay

## 2023-09-24 MED FILL — Anastrozole Tab 1 MG: ORAL | 30 days supply | Qty: 30 | Fill #0 | Status: AC

## 2023-09-24 MED FILL — Nicotine TD Patch 24HR 21 MG/24HR: TRANSDERMAL | 28 days supply | Qty: 28 | Fill #0 | Status: AC

## 2023-09-24 MED FILL — Lorazepam Tab 0.5 MG: ORAL | 30 days supply | Qty: 60 | Fill #0 | Status: AC

## 2023-09-24 NOTE — Telephone Encounter (Signed)
Lvm for pt to give office a call back in regards to refills

## 2023-09-25 ENCOUNTER — Encounter: Payer: Self-pay | Admitting: Oncology

## 2023-09-25 ENCOUNTER — Other Ambulatory Visit: Payer: Self-pay

## 2023-09-25 ENCOUNTER — Inpatient Hospital Stay: Payer: Commercial Managed Care - HMO | Attending: Oncology

## 2023-09-25 ENCOUNTER — Inpatient Hospital Stay: Payer: Commercial Managed Care - HMO | Admitting: Oncology

## 2023-09-25 VITALS — BP 122/77 | HR 76 | Temp 96.9°F | Resp 18 | Wt 127.3 lb

## 2023-09-25 DIAGNOSIS — C50412 Malignant neoplasm of upper-outer quadrant of left female breast: Secondary | ICD-10-CM | POA: Insufficient documentation

## 2023-09-25 DIAGNOSIS — Z08 Encounter for follow-up examination after completed treatment for malignant neoplasm: Secondary | ICD-10-CM

## 2023-09-25 DIAGNOSIS — F419 Anxiety disorder, unspecified: Secondary | ICD-10-CM | POA: Diagnosis not present

## 2023-09-25 DIAGNOSIS — Z8041 Family history of malignant neoplasm of ovary: Secondary | ICD-10-CM | POA: Diagnosis not present

## 2023-09-25 DIAGNOSIS — T451X5A Adverse effect of antineoplastic and immunosuppressive drugs, initial encounter: Secondary | ICD-10-CM

## 2023-09-25 DIAGNOSIS — Z79811 Long term (current) use of aromatase inhibitors: Secondary | ICD-10-CM

## 2023-09-25 DIAGNOSIS — Z170 Estrogen receptor positive status [ER+]: Secondary | ICD-10-CM

## 2023-09-25 DIAGNOSIS — F17210 Nicotine dependence, cigarettes, uncomplicated: Secondary | ICD-10-CM

## 2023-09-25 DIAGNOSIS — G620 Drug-induced polyneuropathy: Secondary | ICD-10-CM | POA: Diagnosis not present

## 2023-09-25 DIAGNOSIS — Z5181 Encounter for therapeutic drug level monitoring: Secondary | ICD-10-CM

## 2023-09-25 DIAGNOSIS — N182 Chronic kidney disease, stage 2 (mild): Secondary | ICD-10-CM

## 2023-09-25 DIAGNOSIS — C50911 Malignant neoplasm of unspecified site of right female breast: Secondary | ICD-10-CM

## 2023-09-25 DIAGNOSIS — Z79899 Other long term (current) drug therapy: Secondary | ICD-10-CM

## 2023-09-25 LAB — LIPID PANEL
Cholesterol: 239 mg/dL — ABNORMAL HIGH (ref 0–200)
HDL: 43 mg/dL (ref >40)
LDL Cholesterol: 135 mg/dL — ABNORMAL HIGH (ref 0–99)
Total CHOL/HDL Ratio: 5.6 {ratio}
Triglycerides: 305 mg/dL — ABNORMAL HIGH (ref <150)
VLDL: 61 mg/dL — ABNORMAL HIGH (ref 0–40)

## 2023-09-25 NOTE — Progress Notes (Signed)
Hematology/Oncology Consult note [facility]  Telephone:([phone] [phone]  Patient Care Team: Allegra Grana, FNP as PCP - General (Family Medicine) Debbe Odea, MD as PCP - Cardiology (Cardiology) Jim Like, RN as Oncology Nurse Navigator Creig Hines, MD as Consulting Physician (Hematology and Oncology) Keitha Butte, RN as Registered Nurse (Oncology) Borders, Daryl Eastern, NP as Nurse Practitioner [facility] and Palliative Medicine)   Name of the patient: Tricia Berry  [id number]  1971/05/19   Date of visit: 09/25/23  Diagnosis- invasive mammary carcinoma of the left breast stage III ypT2 ypN2 cM0 ER/PR positive and HER2 positive   Chief complaint/ Reason for visit-routine follow-up of breast cancer  Heme/Onc history: patient is a 52 year old female who underwent a diagnostic bilateral mammogram on 09/08/2020 which showed hypoechoic interconnected masses in the left breast from 10:00 to 2 o'clock position spanning at least an area of 4.8 cm.  Ultrasound also showed 5 abnormal lymph nodes in the axilla with cortical thickening.  Both the mass and the lymph nodes were biopsied and was consistent with invasive mammary carcinoma grade 2.  Tumor was ER +91- 100%, PR +11 to 20% and HER-2 negative.  Ki-67 15%.    PET CT scan showed hypermetabolism in the area of the left breast but no evidence of hypermetabolism in the left axilla Or evidence of distant metastatic disease.   Neoadjuvant dose dense ACT chemotherapy started on 10/08/2020. Interim ultrasound after 4 cycles of dose dense AC chemotherapy showed overall decrease in volume of the tumor.  Nodularity previously seen on ultrasound was also decreased in size.   Patient completed neoadjuvant AC Taxol chemotherapy and underwent bilateral mastectomy with reconstruction.Final pathology showed fibrocystic changes in the right breast with no malignancy.  3.6 cm invasive  mammary carcinoma in the left breast.  7 out of 16 lymph nodes positive for malignancy.  Treatment effect in the breast present but not robust.  Treatment effect in the lymph nodes minimal.  Margins negative.  Overall grade 2.  Extranodal extension present.  ER greater than 90% positive, PR 15% positive.  HER2 +2 equivocal and positive by FISH on the primary breast specimen.  HER2 testing was however negative in the lymph nodes.  Patient completed 1 year of adjuvant Herceptin and Perjeta in August 2023.  Patient could not tolerate adjuvant Verzenio and stopped taking it. She is currently on Arimidex alone.  She was previously on Arimidex plus ovarian suppression which she started in June 2022.   patient sees Dr. Aundria Rud for chronic hypoxic respiratory failure which has been attributed to pneumonitis but it is unclear as to what the etiology is.  Interval history-she does report generalized myalgias and arthralgias since she has been on Arimidex.  Overall her breathing is better.  No recent hospitalizations.  Neuropathy is overall stable  ECOG PS- 1 Pain scale- 3 Opioid associated constipation- no  Review of systems- Review of Systems  Constitutional:  Positive for malaise/fatigue. Negative for chills, fever and weight loss.  HENT:  Negative for congestion, ear discharge and nosebleeds.   Eyes:  Negative for blurred vision.  Respiratory:  Negative for cough, hemoptysis, sputum production, shortness of breath and wheezing.   Cardiovascular:  Negative for chest pain, palpitations, orthopnea and claudication.  Gastrointestinal:  Negative for abdominal pain, blood in stool, constipation, diarrhea, heartburn, melena, nausea and vomiting.  Genitourinary:  Negative for dysuria, flank pain, frequency, hematuria and urgency.  Musculoskeletal:  Positive for joint pain and  myalgias. Negative for back pain.  Skin:  Negative for rash.  Neurological:  Negative for dizziness, tingling, focal weakness, seizures,  weakness and headaches.  Endo/Heme/Allergies:  Does not bruise/bleed easily.  Psychiatric/Behavioral:  Negative for depression and suicidal ideas. The patient does not have insomnia.       Allergies  Allergen Reactions   Morphine Nausea And Vomiting and Other (See Comments)    migranes Other reaction(s): Headache Migraine, vomiting   Sertraline Hcl     REACTION: Worsened symptoms of IBS   Sulfa Antibiotics Rash   Sulfamethoxazole Rash   Sulfonamide Derivatives Rash     Past Medical History:  Diagnosis Date   Anemia    Anginal pain (HCC)    excertion not cardiac related per patient   Anxiety    Breast cancer (HCC) 12/2021   left breast IMC   Diverticulitis    Dyspnea    Dysrhythmia    prolong QT   Family history of ovarian cancer    GERD (gastroesophageal reflux disease)    Headache    IBS (irritable bowel syndrome)    Neuromuscular disorder (HCC)    neuropathy from chemo   Pneumonia    PONV (postoperative nausea and vomiting)    severe migraine and vomiting post anesthesia   Scoliosis      Past Surgical History:  Procedure Laterality Date   ABDOMINAL HYSTERECTOMY     still has ovaries, no gyn cancer, hysterectomy due to endometriosis. NO cervix on exam 01/10/21   BACK SURGERY     BREAST BIOPSY Left 09/15/2020   [place] bx of mass, path pending, Q marker   BREAST BIOPSY Left 09/15/2020   [place] bx of LN, hydromarker, path pending   BREAST RECONSTRUCTION WITH PLACEMENT OF TISSUE EXPANDER AND FLEX HD (ACELLULAR HYDRATED DERMIS) Bilateral 03/17/2021   Procedure: IMMEDIATE BILATERAL BREAST RECONSTRUCTION WITH PLACEMENT OF TISSUE EXPANDER AND FLEX HD (ACELLULAR HYDRATED DERMIS);  Surgeon: Peggye Form, DO;  Location: [facility];  Service: Plastics;  Laterality: Bilateral;   CAPSULECTOMY Right 10/18/2022   Procedure: Right breast capsule release with adjustment;  Surgeon: Peggye Form, DO;  Location: [facility];  Service: Plastics;   Laterality: Right;   COLONOSCOPY WITH PROPOFOL N/A 11/21/2021   Procedure: COLONOSCOPY WITH PROPOFOL;  Surgeon: Toney Reil, MD;  Location: [facility];  Service: Gastroenterology;  Laterality: N/A;   FLEXIBLE BRONCHOSCOPY Bilateral 12/01/2022   Procedure: FLEXIBLE BRONCHOSCOPY;  Surgeon: Raechel Chute, MD;  Location: [facility];  Service: Pulmonary;  Laterality: Bilateral;   IR IMAGING GUIDED PORT INSERTION  10/01/2020   MODIFIED MASTECTOMY Left 03/17/2021   Procedure: LEFT MODIFIED RADICAL MASTECTOMY;  Surgeon: Harriette Bouillon, MD;  Location: [facility];  Service: General;  Laterality: Left;   PORTA CATH REMOVAL Right 03/17/2021   Procedure: PORTA CATH REMOVAL;  Surgeon: Harriette Bouillon, MD;  Location: [facility];  Service: General;  Laterality: Right;   REMOVAL OF BILATERAL TISSUE EXPANDERS WITH PLACEMENT OF BILATERAL BREAST IMPLANTS Bilateral 05/09/2021   Procedure: REMOVAL OF BILATERAL TISSUE EXPANDERS WITH PLACEMENT OF BILATERAL BREAST IMPLANTS;  Surgeon: Peggye Form, DO;  Location: [facility];  Service: Plastics;  Laterality: Bilateral;  90 min   TOTAL MASTECTOMY Right 03/17/2021   Procedure: TOTAL MASTECTOMY;  Surgeon: Harriette Bouillon, MD;  Location: [facility];  Service: General;  Laterality: Right;    Social History   Socioeconomic History   Marital status: Significant Other  Spouse name: Not on file   Number of children: 2   Years of education: Not on file   Highest education level: Not on file  Occupational History   Occupation: Nurse    Employer:   Tobacco Use   Smoking status: Every Day    Current packs/day: 1.00    Average packs/day: 1 pack/day for 20.0 years (20.0 ttl pk-yrs)    Types: Cigarettes   Smokeless tobacco: Never   Tobacco comments:    1-2 cigarettes weekly- 09/12/2023  Vaping Use   Vaping status: Never Used  Substance and Sexual Activity   Alcohol use: Not  Currently   Drug use: No   Sexual activity: Not Currently    Birth control/protection: Surgical    Comment: hyst  Other Topics Concern   Not on file  Social History Narrative   Patient works as an Insurance underwriter at [employer]. She has 2 children at home who have special needs. She and her spouse are primary caregivers.    Social Determinants of Health   Financial Resource Strain: Not on file  Food Insecurity: No Food Insecurity (03/21/2023)   Hunger Vital Sign    Worried About Running Out of Food in the Last Year: Never true    Ran Out of Food in the Last Year: Never true  Transportation Needs: No Transportation Needs (03/21/2023)   PRAPARE - Administrator, Civil Service (Medical): No    Lack of Transportation (Non-Medical): No  Physical Activity: Not on file  Stress: Not on file  Social Connections: Not on file  Intimate Partner Violence: Not At Risk (03/21/2023)   Humiliation, Afraid, Rape, and Kick questionnaire    Fear of Current or Ex-Partner: No    Emotionally Abused: No    Physically Abused: No    Sexually Abused: No    Family History  Problem Relation Age of Onset   Hypertension Mother    Osteoarthritis Mother    Diverticulitis Mother    Heart failure Father    Hypertension Father    Gout Father    Heart attack Father 60   Diverticulitis Brother    Ovarian cancer Paternal Grandmother      Current Outpatient Medications:    acetaminophen (TYLENOL) 650 MG CR tablet, Take 1,300 mg by mouth every 8 (eight) hours as needed for pain., Disp: , Rfl:    albuterol (PROVENTIL) (2.5 MG/3ML) 0.083% nebulizer solution, Take 3 mLs (2.5 mg total) by nebulization every 6 (six) hours as needed for wheezing or shortness of breath., Disp: 360 mL, Rfl: 12   albuterol (VENTOLIN HFA) 108 (90 Base) MCG/ACT inhaler, Inhale 2 puffs into the lungs every 6 (six) hours as needed for wheezing or shortness of breath., Disp: 6.7 g, Rfl: 0   anastrozole (ARIMIDEX) 1 MG tablet, Take 1 tablet (1  mg total) by mouth daily., Disp: 30 tablet, Rfl: 5   b complex vitamins capsule, Take 1 capsule by mouth daily., Disp: , Rfl:    baclofen (LIORESAL) 10 MG tablet, Take 1 tablet (10 mg total) by mouth 3 (three) times daily as needed for muscle spasms., Disp: 60 tablet, Rfl: 2   Blood Glucose Monitoring Suppl (BLOOD GLUCOSE MONITOR SYSTEM) w/Device KIT, Test in the morning, at noon, and at bedtime., Disp: 1 kit, Rfl: 0   calcium-vitamin D (OSCAL WITH D) 500-5 MG-MCG tablet, Take 1 tablet by mouth daily with breakfast., Disp: , Rfl:    cyanocobalamin (VITAMIN B12) 1000 MCG/ML injection, Inject 1  mL (1,000 mcg total) into the muscle every 30 (thirty) days., Disp: 3 mL, Rfl: 4   dextromethorphan-guaiFENesin (ROBITUSSIN-DM) 10-100 MG/5ML liquid, Take 10 mLs by mouth every 4 (four) hours as needed for cough., Disp: , Rfl:    docusate sodium (COLACE) 100 MG capsule, Take 100 mg by mouth daily as needed for mild constipation., Disp: , Rfl:    FLUoxetine (PROZAC) 20 MG capsule, Take 1 capsule (20 mg total) by mouth every morning., Disp: 90 capsule, Rfl: 3   gentamicin ointment (GARAMYCIN) 0.1 %, Apply to nose 3 (three) times daily., Disp: 30 g, Rfl: 12   ibuprofen (ADVIL) 400 MG tablet, Take 400 mg by mouth every 6 (six) hours as needed., Disp: , Rfl:    Lactobacillus (PROBIOTIC ACIDOPHILUS PO), Take 1 capsule by mouth daily., Disp: , Rfl:    LORazepam (ATIVAN) 0.5 MG tablet, Take 1 tablet (0.5 mg total) by mouth 2 (two) times daily as needed for anxiety., Disp: 60 tablet, Rfl: 2   meloxicam (MOBIC) 15 MG tablet, Take 1 tablet (15 mg total) by mouth daily with meal., Disp: 30 tablet, Rfl: 2   nicotine (NICOTINE STEP 1) 21 mg/24hr patch, Place 1 patch (21 mg total) onto the skin daily., Disp: 28 patch, Rfl: 1   Oxycodone HCl 10 MG TABS, Take 1 tablet (10mg ) by mouth once every four hours as needed for pain. Stop Percocet due to headaches., Disp: 180 tablet, Rfl: 0   Oxycodone HCl 10 MG TABS, Take 1 tablet (10  mg total) by mouth every 4 (four) hours as needed for pain., Disp: 180 tablet, Rfl: 0   [START ON 10/05/2023] Oxycodone HCl 10 MG TABS, Take 1 tablet (10 mg total) by mouth every 4 (four) hours as needed for pain., Disp: 180 tablet, Rfl: 0   pregabalin (LYRICA) 150 MG capsule, Take 1 capsule (150 mg total) by mouth in the morning, at noon, and at bedtime., Disp: 90 capsule, Rfl: 2   rosuvastatin (CRESTOR) 10 MG tablet, Take 1 tablet (10 mg total) by mouth daily., Disp: 90 tablet, Rfl: 3   trimethoprim (TRIMPEX) 100 MG tablet, Take 1 tablet (100 mg total) by mouth daily., Disp: 90 tablet, Rfl: 3   umeclidinium-vilanterol (ANORO ELLIPTA) 62.5-25 MCG/ACT AEPB, Inhale 1 puff into the lungs daily., Disp: 60 each, Rfl: 6   vitamin B-12 (CYANOCOBALAMIN) 100 MCG tablet, Take 100 mcg by mouth daily. gummy, Disp: , Rfl:  No current facility-administered medications for this visit.  Facility-Administered Medications Ordered in Other Visits:    acetaminophen (TYLENOL) 325 MG tablet, , , ,    diphenhydrAMINE (BENADRYL) 25 mg capsule, , , ,    goserelin (ZOLADEX) injection 3.6 mg, 3.6 mg, Subcutaneous, Q28 days, Creig Hines, MD, 3.6 mg at 02/02/23 1206   heparin lock flush 100 unit/mL, 500 Units, Intravenous, Once, Creig Hines, MD   Zoledronic Acid (ZOMETA) 4 MG/100ML IVPB, , , ,   Physical exam:  Vitals:   09/25/23 1155  BP: 122/77  Pulse: 76  Resp: 18  Temp: (!) 96.9 F (36.1 C)  TempSrc: Tympanic  SpO2: 98%  Weight: 127 lb 4.8 oz (57.7 kg)   Physical Exam Cardiovascular:     Rate and Rhythm: Normal rate and regular rhythm.     Heart sounds: Normal heart sounds.  Pulmonary:     Effort: Pulmonary effort is normal.     Breath sounds: Normal breath sounds.  Abdominal:     General: Bowel sounds are normal.  Palpations: Abdomen is soft.  Skin:    General: Skin is warm and dry.  Neurological:     Mental Status: She is alert and oriented to person, place, and time.   Chest wall exam:  Patient is s/p bilateral mastectomy with reconstruction.  No evidence of chest wall recurrence.  No palpable bilateral axillary adenopathy.     Latest Ref Rng & Units 06/01/2023   10:51 AM  CMP  Glucose 70 - 99 mg/dL 97   BUN 6 - 20 mg/dL 9   Creatinine 1.61 - 0.96 mg/dL 0.45   Sodium 409 - 811 mmol/L 138   Potassium 3.5 - 5.1 mmol/L 3.7   Chloride 98 - 111 mmol/L 107   CO2 22 - 32 mmol/L 22   Calcium 8.9 - 10.3 mg/dL 9.2   Total Protein 6.5 - 8.1 g/dL 7.1   Total Bilirubin 0.3 - 1.2 mg/dL 0.5   Alkaline Phos 38 - 126 U/L 57   AST 15 - 41 U/L 21   ALT 0 - 44 U/L 19       Latest Ref Rng & Units 06/01/2023   10:51 AM  CBC  WBC 4.0 - 10.5 K/uL 9.2   Hemoglobin 12.0 - 15.0 g/dL 91.4   Hematocrit 78.2 - 46.0 % 46.0   Platelets 150 - 400 K/uL 216     No images are attached to the encounter.  CT ABDOMEN PELVIS W CONTRAST  Result Date: 08/31/2023 CLINICAL DATA:  Right lower quadrant pain EXAM: CT ABDOMEN AND PELVIS WITH CONTRAST TECHNIQUE: Multidetector CT imaging of the abdomen and pelvis was performed using the standard protocol following bolus administration of intravenous contrast. RADIATION DOSE REDUCTION: This exam was performed according to the departmental dose-optimization program which includes automated exposure control, adjustment of the mA and/or kV according to patient size and/or use of iterative reconstruction technique. CONTRAST:  85mL OMNIPAQUE IOHEXOL 300 MG/ML  SOLN COMPARISON:  CT abdomen and pelvis 12/09/2021 FINDINGS: Lower chest: There are minimal patchy ground-glass opacities in the lung bases, right greater than left. These are new from prior. Hepatobiliary: There is diffuse fatty infiltration of the liver. Gallbladder and bile ducts are within normal limits. Pancreas: Unremarkable. No pancreatic ductal dilatation or surrounding inflammatory changes. Spleen: Normal in size without focal abnormality. Adrenals/Urinary Tract: Adrenal glands are unremarkable. Kidneys are  normal, without renal calculi, focal lesion, or hydronephrosis. Bladder is unremarkable. Stomach/Bowel: Stomach is within normal limits. Appendix appears normal. No evidence of bowel wall thickening, distention, or inflammatory changes. There is sigmoid colon diverticulosis. Vascular/Lymphatic: Aortic atherosclerosis. No enlarged abdominal or pelvic lymph nodes. Reproductive: Status post hysterectomy. No adnexal masses. Other: No abdominal wall hernia or abnormality. No abdominopelvic ascites. Musculoskeletal: There is levoconvex curvature of the lumbar spine. Sclerotic density in the right acetabulum is unchanged. IMPRESSION: 1. No acute localizing process in the abdomen or pelvis. Normal appendix. 2. Fatty infiltration of the liver. 3. Sigmoid colon diverticulosis. 4. Minimal patchy ground-glass opacities in the lung bases, right greater than left, new from prior, worrisome for infection or inflammation. 5. Aortic atherosclerosis. Aortic Atherosclerosis (ICD10-I70.0). Electronically Signed   By: Darliss Cheney M.D.   On: 08/31/2023 16:54     Assessment and plan- Patient is a 52 y.o. female with history of stage III left breast cancer ER/PR positive and mixed pathology of HER2 positive and HER2 negative.  She is currently on Arimidex and this is a routine follow-up visit  Hormone levels checked 2 months ago were postmenopausal.  Levels  from today are pending.  She is s/p hysterectomy but still has ovaries in situ.  We have stopped giving her Zoladex since July 2024.  I do not plan to restart it if her hormone levels are consistent with postmenopausal status.  She will continue Arimidex for at least 5 years which she started back in August 2021 if not for 10 years.  She will continue to take that along with calcium and vitamin D.  I will see her back in 6 months with repeat estradiol levels and I will check a bone density scan prior.  Patient was previously receiving adjuvant Zometa every 3 months which is  completed her 2 years of treatment.  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  Her recent CT chest abdomen and pelvis with contrast did not show any evidence of distant metastatic disease.  Overall findings of pneumonitis appear to be improving.  She has some mild mediastinal adenopathy which has not changed over the last 6 months.   Visit Diagnosis 1. Encounter for follow-up surveillance of breast cancer   2. Visit for monitoring Arimidex therapy   3. High risk medication use   4. Chemotherapy-induced peripheral neuropathy (HCC)      Dr. Owens Shark, MD, MPH [facility] [phone] 09/25/2023 3:06 PM

## 2023-09-25 NOTE — Addendum Note (Signed)
Addended by: Warden Fillers on: 09/25/2023 12:24 PM   Modules accepted: Orders

## 2023-09-26 LAB — ESTRADIOL: Estradiol: <5 pg/mL

## 2023-09-26 LAB — INHIBIN B: Inhibin B: <7 pg/mL

## 2023-10-03 ENCOUNTER — Other Ambulatory Visit: Payer: Self-pay

## 2023-10-04 ENCOUNTER — Other Ambulatory Visit: Payer: Managed Care, Other (non HMO) | Admitting: Urology

## 2023-10-04 NOTE — Telephone Encounter (Signed)
Crystal has communicated with the patient and rescheduled her appointment.

## 2023-10-05 ENCOUNTER — Other Ambulatory Visit: Payer: Self-pay

## 2023-10-16 ENCOUNTER — Other Ambulatory Visit: Payer: Self-pay

## 2023-10-16 NOTE — Addendum Note (Signed)
Addended by: [place], Skylur Fuston on: 10/16/2023 09:22 AM   Modules accepted: Orders

## 2023-10-23 ENCOUNTER — Encounter: Payer: Self-pay | Admitting: Oncology

## 2023-10-24 HISTORY — PX: OTHER SURGICAL HISTORY: SHX169

## 2023-10-24 HISTORY — PX: HAND SURGERY: SHX662

## 2023-10-25 ENCOUNTER — Encounter: Payer: Self-pay | Admitting: Oncology

## 2023-10-26 ENCOUNTER — Other Ambulatory Visit: Payer: Self-pay

## 2023-10-26 MED FILL — Fluoxetine HCl Cap 20 MG: ORAL | 90 days supply | Qty: 90 | Fill #2 | Status: AC

## 2023-10-26 MED FILL — Pregabalin Cap 150 MG: ORAL | 30 days supply | Qty: 90 | Fill #2 | Status: AC

## 2023-10-26 MED FILL — Anastrozole Tab 1 MG: ORAL | 30 days supply | Qty: 30 | Fill #1 | Status: AC

## 2023-10-29 ENCOUNTER — Other Ambulatory Visit: Payer: Self-pay

## 2023-11-05 ENCOUNTER — Other Ambulatory Visit: Payer: Self-pay

## 2023-11-05 MED ORDER — OXYCODONE HCL 10 MG PO TABS
10.0000 mg | ORAL_TABLET | ORAL | 0 refills | Status: DC | PRN
  Filled 2023-11-05: qty 18, 3d supply, fill #0

## 2023-11-05 MED FILL — Lorazepam Tab 0.5 MG: ORAL | 30 days supply | Qty: 60 | Fill #1 | Status: AC

## 2023-11-07 ENCOUNTER — Other Ambulatory Visit: Payer: Self-pay

## 2023-11-07 MED ORDER — OXYCODONE HCL 10 MG PO TABS
10.0000 mg | ORAL_TABLET | ORAL | 0 refills | Status: DC | PRN
  Filled 2023-11-07: qty 180, 30d supply, fill #0

## 2023-11-07 MED ORDER — OXYCODONE HCL 10 MG PO TABS
10.0000 mg | ORAL_TABLET | ORAL | 0 refills | Status: DC | PRN
  Filled 2023-12-06: qty 180, 30d supply, fill #0

## 2023-11-13 ENCOUNTER — Telehealth: Payer: Self-pay | Admitting: Family

## 2023-11-13 DIAGNOSIS — Z0279 Encounter for issue of other medical certificate: Secondary | ICD-10-CM

## 2023-11-13 NOTE — Telephone Encounter (Signed)
Placed in red folder on Margaret's desk for completion.

## 2023-11-13 NOTE — Telephone Encounter (Signed)
Short term disability paper work was dropped off for [employer] to complete. Please fax and call patient to pick up a copy. Paper work is up front in [employer]

## 2023-11-14 ENCOUNTER — Ambulatory Visit: Payer: Commercial Managed Care - HMO | Admitting: Urology

## 2023-11-14 VITALS — BP 128/79 | HR 101 | Ht 60.0 in | Wt 123.2 lb

## 2023-11-14 DIAGNOSIS — R3129 Other microscopic hematuria: Secondary | ICD-10-CM | POA: Diagnosis not present

## 2023-11-14 DIAGNOSIS — N390 Urinary tract infection, site not specified: Secondary | ICD-10-CM

## 2023-11-14 DIAGNOSIS — Z87440 Personal history of urinary (tract) infections: Secondary | ICD-10-CM

## 2023-11-14 LAB — URINALYSIS, COMPLETE
Bilirubin, UA: NEGATIVE
Glucose, UA: NEGATIVE
Ketones, UA: NEGATIVE
Leukocytes,UA: NEGATIVE
Nitrite, UA: NEGATIVE
Protein,UA: NEGATIVE
Specific Gravity, UA: 1.01 (ref 1.005–1.030)
Urobilinogen, Ur: 1 mg/dL (ref 0.2–1.0)
pH, UA: 5.5 (ref 5.0–7.5)

## 2023-11-14 LAB — MICROSCOPIC EXAMINATION: Bacteria, UA: NONE SEEN

## 2023-11-14 NOTE — Progress Notes (Signed)
   11/14/23  CC:  Chief Complaint  Patient presents with   Cysto    HPI: 53 year old female who presents today for cystoscopy for microscopic hematuria.  Notably, she had workup in 2023 including CT urogram and cystoscopy that was unremarkable.  She does have a personal history of smoking.  Most recent upper tract imaging includes a renal ultrasound 824 which was unremarkable as well as CT abdomen pelvis with contrast on 08/2023 that shows no GU pathology.  She does have urinary symptoms or recurrent urinary tract infections.  She is on anastrozole.  Dr. Smith Robert is her oncologist.  Blood pressure 128/79, pulse (!) 101, height 5' (1.524 m), weight 123 lb 4 oz (55.9 kg). NED. A&Ox3.   No respiratory distress   Abd soft, NT, ND Normal external genitalia with patent urethral meatus  Cystoscopy Procedure Note  Patient identification was confirmed, informed consent was obtained, and patient was prepped using Betadine solution.  Lidocaine jelly was administered per urethral meatus.    Procedure: - Flexible cystoscope introduced, without any difficulty.   - Thorough search of the bladder revealed:    Slightly narrow external urethra which is atrophic.    normal urothelium    no stones    no ulcers     no tumors    no urethral polyps    no trabeculation  - Ureteral orifices were normal in position and appearance.  Post-Procedure: - Patient tolerated the procedure well  Assessment/ Plan:  1. Microscopic hematuria (Primary) Cystoscopy today is unremarkable - Urinalysis, Complete  2. 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  Encouraged to resume vaginal lubricant.  Also, will touch base with her oncologist about the potential use of topical estrogen cream.  Absorption is a low systemically, would be risk-benefit discussion.  - Urinalysis, Complete  F/u 1 year with Dyann Kief, MD

## 2023-11-15 ENCOUNTER — Encounter: Payer: Self-pay | Admitting: Urology

## 2023-11-16 ENCOUNTER — Telehealth: Payer: Self-pay | Admitting: *Deleted

## 2023-11-16 NOTE — Telephone Encounter (Signed)
I got a call this afternoon about 3:30 and wanted information with the date on it with Dr. Assunta Gambles notes as well as Dr. Liana Gerold notes.  I faxed them already and it should have been gotten to him today because I started it this morning and we sent it through and it went through.  The number that Dorene Sorrow left was [phone] and extention [phone].I was going to call him to say I faced already and all the info is sent out but the voicemail is full and can't use it. The transmission for the papers went through 11/16/2023

## 2023-11-20 ENCOUNTER — Telehealth: Payer: Self-pay | Admitting: Student in an Organized Health Care Education/Training Program

## 2023-11-20 DIAGNOSIS — Z08 Encounter for follow-up examination after completed treatment for malignant neoplasm: Secondary | ICD-10-CM

## 2023-11-20 NOTE — Telephone Encounter (Signed)
Dr. Aundria Rud completed updated disability form and I faxed to [facility]  fax# [phone], included office notes back to 01/22/2023

## 2023-11-20 NOTE — Progress Notes (Signed)
Survivorship Care Plan visit completed.  Treatment summary reviewed and given to patient.  ASCO answers booklet reviewed and given to patient.  CARE program and Cancer Transitions discussed with patient along with other resources cancer center offers to patients and caregivers.  Patient verbalized understanding.

## 2023-11-21 NOTE — Telephone Encounter (Signed)
Paperwork has been faxed as of 11/20/23

## 2023-11-23 ENCOUNTER — Other Ambulatory Visit: Payer: Self-pay

## 2023-11-23 ENCOUNTER — Encounter: Payer: Self-pay | Admitting: Family

## 2023-11-23 ENCOUNTER — Ambulatory Visit: Payer: Commercial Managed Care - HMO | Admitting: Family

## 2023-11-23 VITALS — BP 122/80 | HR 67 | Temp 98.1°F | Ht 60.0 in | Wt 122.4 lb

## 2023-11-23 DIAGNOSIS — M545 Low back pain, unspecified: Secondary | ICD-10-CM

## 2023-11-23 DIAGNOSIS — F411 Generalized anxiety disorder: Secondary | ICD-10-CM

## 2023-11-23 DIAGNOSIS — E785 Hyperlipidemia, unspecified: Secondary | ICD-10-CM

## 2023-11-23 DIAGNOSIS — G894 Chronic pain syndrome: Secondary | ICD-10-CM | POA: Diagnosis not present

## 2023-11-23 MED ORDER — FLUOXETINE HCL 40 MG PO CAPS
40.0000 mg | ORAL_CAPSULE | Freq: Every day | ORAL | 3 refills | Status: DC
  Filled 2023-11-23: qty 90, 90d supply, fill #0
  Filled 2024-02-20: qty 90, 90d supply, fill #1
  Filled 2024-05-21: qty 90, 90d supply, fill #2

## 2023-11-23 MED ORDER — PREGABALIN 150 MG PO CAPS
150.0000 mg | ORAL_CAPSULE | Freq: Three times a day (TID) | ORAL | 2 refills | Status: DC
  Filled 2023-11-23 – 2023-11-26 (×2): qty 90, 30d supply, fill #0
  Filled 2023-12-28: qty 90, 30d supply, fill #1
  Filled 2024-01-23 – 2024-01-25 (×2): qty 90, 30d supply, fill #2

## 2023-11-23 NOTE — Assessment & Plan Note (Signed)
Chronic, stable.  She continues to follow with pain management.  I prescribed Lyrica 150 three times daily.  Refilled today.

## 2023-11-23 NOTE — Assessment & Plan Note (Addendum)
LDL > 100.  CT calcium score  12/2021 score of 0, no evidence of CAD Smoker; family h/o ASCVD The 10-year ASCVD risk score (Yoshiaki Kreuser DK, et al., 2019) is: 6.1% Intolerance to Crestor 10 mg. Continue crestor 5mg  every day.  We discussed Zetia.  Patient prefers work on lifestyle modifications we will revisit lipid panel at follow-up.

## 2023-11-23 NOTE — Progress Notes (Signed)
Assessment & Plan:  Anxiety state Assessment & Plan: Chronic, overall stable however interested in resuming previous dose of Prozac 40 mg.  I have set in new refill.  Continue Ativan 0.5 mg twice daily  Orders: -     FLUoxetine HCl; Take 1 capsule (40 mg total) by mouth daily.  Dispense: 90 capsule; Refill: 3  Right-sided low back pain without sciatica, unspecified chronicity -     Pregabalin; Take 1 capsule (150 mg total) by mouth in the morning, at noon, and at bedtime.  Dispense: 90 capsule; Refill: 2  Chronic pain syndrome Assessment & Plan: Chronic, stable.  She continues to follow with pain management.  I prescribed Lyrica 150 three times daily.  Refilled today.    Hyperlipidemia, unspecified hyperlipidemia type Assessment & Plan: LDL > 100.  CT calcium score  12/2021 score of 0, no evidence of CAD Smoker; family h/o ASCVD The 10-year ASCVD risk score (Sherrell Weir DK, et al., 2019) is: 6.1% Intolerance to Crestor 10 mg. Continue crestor 5mg  every day.  We discussed Zetia.  Patient prefers work on lifestyle modifications we will revisit lipid panel at follow-up.       Return precautions given.   Risks, benefits, and alternatives of the medications and treatment plan prescribed today were discussed, and patient expressed understanding.   Education regarding symptom management and diagnosis given to patient on AVS either electronically or printed.  Return in about 3 months (around 02/20/2024).  Rennie Plowman, FNP  Subjective:    Patient ID: Lequita Asal, female    DOB: 1971/02/25, 53 y.o.   MRN: [id number]  CC: Tricia Berry is a 53 y.o. female who presents today for follow up.   HPI: Medication follow-up Overall feels well today.  No new complaints     Compliant with Prozac 20 mg daily; she would like to back to prozac 40mg  every day. She takes ativan 0.5mg  BID most days at bedtime.    Compliant with Crestor 5 mg as 10 mg caused myalgia.   CT  calcium score  12/2021 score of 0, no evidence of CAD  Smoker  Low back pain, she continues to follow with pain management.  Compliant with Lyrica 150 mg TID  Due PCV20   Allergies: Morphine, Sertraline hcl, Sulfa antibiotics, Sulfamethoxazole, and Sulfonamide derivatives Current Outpatient Medications on File Prior to Visit  Medication Sig Dispense Refill   acetaminophen (TYLENOL) 650 MG CR tablet Take 1,300 mg by mouth every 8 (eight) hours as needed for pain.     albuterol (PROVENTIL) (2.5 MG/3ML) 0.083% nebulizer solution Take 3 mLs (2.5 mg total) by nebulization every 6 (six) hours as needed for wheezing or shortness of breath. 360 mL 12   albuterol (VENTOLIN HFA) 108 (90 Base) MCG/ACT inhaler Inhale 2 puffs into the lungs every 6 (six) hours as needed for wheezing or shortness of breath. 6.7 g 0   anastrozole (ARIMIDEX) 1 MG tablet Take 1 tablet (1 mg total) by mouth daily. 30 tablet 5   b complex vitamins capsule Take 1 capsule by mouth daily.     baclofen (LIORESAL) 10 MG tablet Take 1 tablet (10 mg total) by mouth 3 (three) times daily as needed for muscle spasms. 60 tablet 2   calcium-vitamin D (OSCAL WITH D) 500-5 MG-MCG tablet Take 1 tablet by mouth daily with breakfast.     cyanocobalamin (VITAMIN B12) 1000 MCG/ML injection Inject 1 mL (1,000 mcg total) into the muscle every 30 (thirty) days. 3 mL 4  docusate sodium (COLACE) 100 MG capsule Take 100 mg by mouth daily as needed for mild constipation.     gentamicin ointment (GARAMYCIN) 0.1 % Apply to nose 3 (three) times daily. 30 g 12   ibuprofen (ADVIL) 400 MG tablet Take 400 mg by mouth every 6 (six) hours as needed.     Lactobacillus (PROBIOTIC ACIDOPHILUS PO) Take 1 capsule by mouth daily.     LORazepam (ATIVAN) 0.5 MG tablet Take 1 tablet (0.5 mg total) by mouth 2 (two) times daily as needed for anxiety. 60 tablet 2   nicotine (NICOTINE STEP 1) 21 mg/24hr patch Place 1 patch (21 mg total) onto the skin daily. 28 patch 1    Oxycodone HCl 10 MG TABS Take 1 tablet (10mg ) by mouth once every four hours as needed for pain. Stop Percocet due to headaches. 180 tablet 0   Oxycodone HCl 10 MG TABS Take 1 tablet (10 mg total) by mouth every 4 (four) hours as needed for pain. 180 tablet 0   Oxycodone HCl 10 MG TABS Take 1 tablet (10 mg total) by mouth every 4 (four) hours as needed for pain 18 tablet 0   [START ON 12/06/2023] Oxycodone HCl 10 MG TABS Take 1 tablet (10 mg total) by mouth every 4 (four) hours as needed for pain. 180 tablet 0   Oxycodone HCl 10 MG TABS Take 1 tablet (10 mg total) by mouth every 4 (four) hours as needed for pain 180 tablet 0   rosuvastatin (CRESTOR) 10 MG tablet Take 1 tablet (10 mg total) by mouth daily. (Patient taking differently: Take 10 mg by mouth daily. Pt is taking 5 mg of Crestor instead of the 10 mg) 90 tablet 3   trimethoprim (TRIMPEX) 100 MG tablet Take 1 tablet (100 mg total) by mouth daily. 90 tablet 3   umeclidinium-vilanterol (ANORO ELLIPTA) 62.5-25 MCG/ACT AEPB Inhale 1 puff into the lungs daily. 60 each 6   vitamin B-12 (CYANOCOBALAMIN) 100 MCG tablet Take 100 mcg by mouth daily. gummy     Current Facility-Administered Medications on File Prior to Visit  Medication Dose Route Frequency Provider Last Rate Last Admin   acetaminophen (TYLENOL) 325 MG tablet            diphenhydrAMINE (BENADRYL) 25 mg capsule            goserelin (ZOLADEX) injection 3.6 mg  3.6 mg Subcutaneous Q28 days Creig Hines, MD   3.6 mg at 02/02/23 1206   heparin lock flush 100 unit/mL  500 Units Intravenous Once Creig Hines, MD       Zoledronic Acid (ZOMETA) 4 MG/100ML IVPB             Review of Systems  Constitutional:  Negative for chills and fever.  Respiratory:  Negative for cough.   Cardiovascular:  Negative for chest pain and palpitations.  Gastrointestinal:  Negative for nausea and vomiting.  Musculoskeletal:  Positive for back pain.      Objective:    BP 122/80   Pulse 67   Temp 98.1  F (36.7 C) (Oral)   Ht 5' (1.524 m)   Wt 122 lb 6.4 oz (55.5 kg)   SpO2 96%   BMI 23.90 kg/m  BP Readings from Last 3 Encounters:  11/23/23 122/80  11/14/23 128/79  09/25/23 122/77   Wt Readings from Last 3 Encounters:  11/23/23 122 lb 6.4 oz (55.5 kg)  11/14/23 123 lb 4 oz (55.9 kg)  09/25/23 127 lb 4.8  oz (57.7 kg)    Physical Exam Vitals reviewed.  Constitutional:      Appearance: She is well-developed.  Eyes:     Conjunctiva/sclera: Conjunctivae normal.  Cardiovascular:     Rate and Rhythm: Normal rate and regular rhythm.     Pulses: Normal pulses.     Heart sounds: Normal heart sounds.  Pulmonary:     Effort: Pulmonary effort is normal.     Breath sounds: Normal breath sounds. No wheezing, rhonchi or rales.  Skin:    General: Skin is warm and dry.  Neurological:     Mental Status: She is alert.  Psychiatric:        Speech: Speech normal.        Behavior: Behavior normal.        Thought Content: Thought content normal.

## 2023-11-23 NOTE — Assessment & Plan Note (Signed)
Chronic, overall stable however interested in resuming previous dose of Prozac 40 mg.  I have set in new refill.  Continue Ativan 0.5 mg twice daily

## 2023-11-25 MED FILL — Anastrozole Tab 1 MG: ORAL | 30 days supply | Qty: 30 | Fill #2 | Status: AC

## 2023-11-26 ENCOUNTER — Other Ambulatory Visit: Payer: Self-pay

## 2023-11-26 MED ORDER — OXYCODONE HCL 10 MG PO TABS
10.0000 mg | ORAL_TABLET | ORAL | 0 refills | Status: DC | PRN
  Filled 2024-01-30: qty 180, 30d supply, fill #0

## 2023-11-26 MED ORDER — OXYCODONE HCL 10 MG PO TABS
10.0000 mg | ORAL_TABLET | ORAL | 0 refills | Status: DC | PRN
  Filled 2024-01-02: qty 180, 30d supply, fill #0

## 2023-11-27 ENCOUNTER — Ambulatory Visit: Payer: 59 | Admitting: Plastic Surgery

## 2023-11-27 ENCOUNTER — Other Ambulatory Visit: Payer: Managed Care, Other (non HMO) | Admitting: Urology

## 2023-11-27 ENCOUNTER — Telehealth: Payer: Self-pay | Admitting: Urology

## 2023-11-27 ENCOUNTER — Telehealth: Payer: Self-pay

## 2023-11-27 NOTE — Telephone Encounter (Signed)
Joey from [employer] called regarding OV notes to support patient's long term disability. I called their office back and made them aware we do not do long term disability and that the paperwork was filled out by her PCP.

## 2023-11-27 NOTE — Telephone Encounter (Signed)
 I s/w Eric concerning pt's FMLA/disability paperwork they paid for 1/22.  I informed him they were sent a MyChart message with several questions that needed to be answered by pt, before we can let Clotilda review and determine if this is something our office can do.

## 2023-12-05 ENCOUNTER — Other Ambulatory Visit: Payer: Self-pay

## 2023-12-06 ENCOUNTER — Other Ambulatory Visit: Payer: Self-pay

## 2023-12-06 ENCOUNTER — Encounter: Payer: Self-pay | Admitting: Student in an Organized Health Care Education/Training Program

## 2023-12-06 ENCOUNTER — Ambulatory Visit: Payer: Commercial Managed Care - HMO | Admitting: Student in an Organized Health Care Education/Training Program

## 2023-12-06 VITALS — BP 102/70 | HR 64 | Temp 97.1°F | Resp 16 | Ht 60.0 in | Wt 121.0 lb

## 2023-12-06 DIAGNOSIS — R0602 Shortness of breath: Secondary | ICD-10-CM

## 2023-12-06 DIAGNOSIS — J698 Pneumonitis due to inhalation of other solids and liquids: Secondary | ICD-10-CM

## 2023-12-06 DIAGNOSIS — J984 Other disorders of lung: Secondary | ICD-10-CM

## 2023-12-06 DIAGNOSIS — T50905A Adverse effect of unspecified drugs, medicaments and biological substances, initial encounter: Secondary | ICD-10-CM

## 2023-12-06 DIAGNOSIS — J9611 Chronic respiratory failure with hypoxia: Secondary | ICD-10-CM | POA: Diagnosis not present

## 2023-12-06 NOTE — Progress Notes (Unsigned)
Synopsis: Referred in *** by Allegra Grana, FNP  Assessment & Plan:   1. Chronic hypoxic respiratory failure (HCC) (Primary) *** - Overnight Pulse Oximetry Study; Future  2. Drug-induced pneumonitis ***  3. Shortness of breath ***  Feels well, improved symptoms overall. Oxygenation 99% today in clinic. Using oxygen nocturnally, continues to smoke, counseled re cessation. Will send overnight pulse ox. Follow up in 6 months. No change  Return in about 6 months (around 06/04/2024).  I spent *** minutes caring for this patient today, including {EM billing:28027}  Raechel Chute, MD [facility] 12/06/2023 10:30 AM    End of visit medications:  No orders of the defined types were placed in this encounter.    Current Outpatient Medications:    acetaminophen (TYLENOL) 650 MG CR tablet, Take 1,300 mg by mouth every 8 (eight) hours as needed for pain., Disp: , Rfl:    albuterol (PROVENTIL) (2.5 MG/3ML) 0.083% nebulizer solution, Take 3 mLs (2.5 mg total) by nebulization every 6 (six) hours as needed for wheezing or shortness of breath., Disp: 360 mL, Rfl: 12   albuterol (VENTOLIN HFA) 108 (90 Base) MCG/ACT inhaler, Inhale 2 puffs into the lungs every 6 (six) hours as needed for wheezing or shortness of breath., Disp: 6.7 g, Rfl: 0   anastrozole (ARIMIDEX) 1 MG tablet, Take 1 tablet (1 mg total) by mouth daily., Disp: 30 tablet, Rfl: 5   b complex vitamins capsule, Take 1 capsule by mouth daily., Disp: , Rfl:    baclofen (LIORESAL) 10 MG tablet, Take 1 tablet (10 mg total) by mouth 3 (three) times daily as needed for muscle spasms., Disp: 60 tablet, Rfl: 2   calcium-vitamin D (OSCAL WITH D) 500-5 MG-MCG tablet, Take 1 tablet by mouth daily with breakfast., Disp: , Rfl:    cyanocobalamin (VITAMIN B12) 1000 MCG/ML injection, Inject 1 mL (1,000 mcg total) into the muscle every 30 (thirty) days., Disp: 3 mL, Rfl: 4   docusate sodium (COLACE) 100 MG capsule, Take 100  mg by mouth daily as needed for mild constipation., Disp: , Rfl:    FLUoxetine (PROZAC) 40 MG capsule, Take 1 capsule (40 mg total) by mouth daily., Disp: 90 capsule, Rfl: 3   gentamicin ointment (GARAMYCIN) 0.1 %, Apply to nose 3 (three) times daily., Disp: 30 g, Rfl: 12   ibuprofen (ADVIL) 400 MG tablet, Take 400 mg by mouth every 6 (six) hours as needed., Disp: , Rfl:    Lactobacillus (PROBIOTIC ACIDOPHILUS PO), Take 1 capsule by mouth daily., Disp: , Rfl:    LORazepam (ATIVAN) 0.5 MG tablet, Take 1 tablet (0.5 mg total) by mouth 2 (two) times daily as needed for anxiety., Disp: 60 tablet, Rfl: 2   nicotine (NICOTINE STEP 1) 21 mg/24hr patch, Place 1 patch (21 mg total) onto the skin daily., Disp: 28 patch, Rfl: 1   Oxycodone HCl 10 MG TABS, Take 1 tablet (10mg ) by mouth once every four hours as needed for pain. Stop Percocet due to headaches., Disp: 180 tablet, Rfl: 0   Oxycodone HCl 10 MG TABS, Take 1 tablet (10 mg total) by mouth every 4 (four) hours as needed for pain., Disp: 180 tablet, Rfl: 0   Oxycodone HCl 10 MG TABS, Take 1 tablet (10 mg total) by mouth every 4 (four) hours as needed for pain, Disp: 18 tablet, Rfl: 0   Oxycodone HCl 10 MG TABS, Take 1 tablet (10 mg total) by mouth every 4 (four) hours as needed for  pain., Disp: 180 tablet, Rfl: 0   Oxycodone HCl 10 MG TABS, Take 1 tablet (10 mg total) by mouth every 4 (four) hours as needed for pain, Disp: 180 tablet, Rfl: 0   [START ON 01/03/2024] Oxycodone HCl 10 MG TABS, Take 1 tablet (10 mg total) by mouth every 4 (four) hours as needed for pain., Disp: 180 tablet, Rfl: 0   Oxycodone HCl 10 MG TABS, Take 1 tablet (10 mg total) by mouth every 4 (four) hours as needed for pain., Disp: 180 tablet, Rfl: 0   pregabalin (LYRICA) 150 MG capsule, Take 1 capsule (150 mg total) by mouth in the morning, at noon, and at bedtime., Disp: 90 capsule, Rfl: 2   rosuvastatin (CRESTOR) 10 MG tablet, Take 1 tablet (10 mg total) by mouth daily. (Patient  taking differently: Take 10 mg by mouth daily. Pt is taking 5 mg of Crestor instead of the 10 mg), Disp: 90 tablet, Rfl: 3   trimethoprim (TRIMPEX) 100 MG tablet, Take 1 tablet (100 mg total) by mouth daily., Disp: 90 tablet, Rfl: 3   umeclidinium-vilanterol (ANORO ELLIPTA) 62.5-25 MCG/ACT AEPB, Inhale 1 puff into the lungs daily., Disp: 60 each, Rfl: 6   vitamin B-12 (CYANOCOBALAMIN) 100 MCG tablet, Take 100 mcg by mouth daily. gummy, Disp: , Rfl:  No current facility-administered medications for this visit.  Facility-Administered Medications Ordered in Other Visits:    acetaminophen (TYLENOL) 325 MG tablet, , , ,    diphenhydrAMINE (BENADRYL) 25 mg capsule, , , ,    goserelin (ZOLADEX) injection 3.6 mg, 3.6 mg, Subcutaneous, Q28 days, Creig Hines, MD, 3.6 mg at 02/02/23 1206   heparin lock flush 100 unit/mL, 500 Units, Intravenous, Once, Creig Hines, MD   Zoledronic Acid (ZOMETA) 4 MG/100ML IVPB, , , ,    Subjective:   PATIENT ID: Tricia Berry GENDER: female DOB: December 09, 1970, MRN: [id number]  Chief Complaint  Patient presents with   Follow-up    Breathing has been much better recently. Has had issues with it dropping in the recent pass. Hasn't had to use o2 during day    HPI ***  Ancillary information including prior medications, full medical/surgical/family/social histories, and PFTs (when available) are listed below and have been reviewed.   ROS   Objective:   Vitals:   12/06/23 1014  BP: 102/70  Pulse: 64  Resp: 16  Temp: (!) 97.1 F (36.2 C)  TempSrc: Temporal  SpO2: 99%  Weight: 121 lb (54.9 kg)  Height: 5' (1.524 m)   99% on *** LPM *** RA BMI Readings from Last 3 Encounters:  12/06/23 23.63 kg/m  11/23/23 23.90 kg/m  11/14/23 24.07 kg/m   Wt Readings from Last 3 Encounters:  12/06/23 121 lb (54.9 kg)  11/23/23 122 lb 6.4 oz (55.5 kg)  11/14/23 123 lb 4 oz (55.9 kg)    Physical Exam    Ancillary Information    Past Medical History:   Diagnosis Date   Anemia    Anginal pain (HCC)    excertion not cardiac related per patient   Anxiety    Breast cancer (HCC) 12/2021   left breast IMC   Diverticulitis    Dyspnea    Dysrhythmia    prolong QT   Family history of ovarian cancer    GERD (gastroesophageal reflux disease)    Headache    IBS (irritable bowel syndrome)    Neuromuscular disorder (HCC)    neuropathy from chemo   Pneumonia  PONV (postoperative nausea and vomiting)    severe migraine and vomiting post anesthesia   Scoliosis      Family History  Problem Relation Age of Onset   Hypertension Mother    Osteoarthritis Mother    Diverticulitis Mother    Heart failure Father    Hypertension Father    Gout Father    Heart attack Father 49   Diverticulitis Brother    Ovarian cancer Paternal Grandmother      Past Surgical History:  Procedure Laterality Date   ABDOMINAL HYSTERECTOMY     still has ovaries, no gyn cancer, hysterectomy due to endometriosis. NO cervix on exam 01/10/21   BACK SURGERY     BREAST BIOPSY Left 09/15/2020   [place] bx of mass, path pending, Q marker   BREAST BIOPSY Left 09/15/2020   [place] bx of LN, hydromarker, path pending   BREAST RECONSTRUCTION WITH PLACEMENT OF TISSUE EXPANDER AND FLEX HD (ACELLULAR HYDRATED DERMIS) Bilateral 03/17/2021   Procedure: IMMEDIATE BILATERAL BREAST RECONSTRUCTION WITH PLACEMENT OF TISSUE EXPANDER AND FLEX HD (ACELLULAR HYDRATED DERMIS);  Surgeon: Peggye Form, DO;  Location: [facility];  Service: Plastics;  Laterality: Bilateral;   CAPSULECTOMY Right 10/18/2022   Procedure: Right breast capsule release with adjustment;  Surgeon: Peggye Form, DO;  Location: [facility];  Service: Plastics;  Laterality: Right;   COLONOSCOPY WITH PROPOFOL N/A 11/21/2021   Procedure: COLONOSCOPY WITH PROPOFOL;  Surgeon: Toney Reil, MD;  Location: [facility];  Service: Gastroenterology;  Laterality: N/A;   FLEXIBLE  BRONCHOSCOPY Bilateral 12/01/2022   Procedure: FLEXIBLE BRONCHOSCOPY;  Surgeon: Raechel Chute, MD;  Location: [facility];  Service: Pulmonary;  Laterality: Bilateral;   IR IMAGING GUIDED PORT INSERTION  10/01/2020   MODIFIED MASTECTOMY Left 03/17/2021   Procedure: LEFT MODIFIED RADICAL MASTECTOMY;  Surgeon: Harriette Bouillon, MD;  Location: [facility];  Service: General;  Laterality: Left;   PORTA CATH REMOVAL Right 03/17/2021   Procedure: PORTA CATH REMOVAL;  Surgeon: Harriette Bouillon, MD;  Location: [facility];  Service: General;  Laterality: Right;   REMOVAL OF BILATERAL TISSUE EXPANDERS WITH PLACEMENT OF BILATERAL BREAST IMPLANTS Bilateral 05/09/2021   Procedure: REMOVAL OF BILATERAL TISSUE EXPANDERS WITH PLACEMENT OF BILATERAL BREAST IMPLANTS;  Surgeon: Peggye Form, DO;  Location: [facility];  Service: Plastics;  Laterality: Bilateral;  90 min   saginal band repair and capsule release  10/2023   TOTAL MASTECTOMY Right 03/17/2021   Procedure: TOTAL MASTECTOMY;  Surgeon: Harriette Bouillon, MD;  Location: [facility];  Service: General;  Laterality: Right;    Social History   Socioeconomic History   Marital status: Significant Other    Spouse name: Not on file   Number of children: 2   Years of education: Not on file   Highest education level: Not on file  Occupational History   Occupation: Nurse    Employer: [employer]  Tobacco Use   Smoking status: Every Day    Current packs/day: 1.00    Average packs/day: 1 pack/day for 20.0 years (20.0 ttl pk-yrs)    Types: Cigarettes   Smokeless tobacco: Never   Tobacco comments:    1-2 cigarettes weekly- 09/12/2023  Vaping Use   Vaping status: Never Used  Substance and Sexual Activity   Alcohol use: Not Currently   Drug use: No   Sexual activity: Not Currently    Birth control/protection: Surgical    Comment: hyst  Other Topics  Concern   Not on file  Social History  Narrative   Patient works as an Insurance underwriter at [employer]. She has 2 children at home who have special needs. She and her spouse are primary caregivers.    Social Drivers of Corporate investment banker Strain: Not on file  Food Insecurity: No Food Insecurity (03/21/2023)   Hunger Vital Sign    Worried About Running Out of Food in the Last Year: Never true    Ran Out of Food in the Last Year: Never true  Transportation Needs: No Transportation Needs (03/21/2023)   PRAPARE - Administrator, Civil Service (Medical): No    Lack of Transportation (Non-Medical): No  Physical Activity: Not on file  Stress: Not on file  Social Connections: Not on file  Intimate Partner Violence: Not At Risk (03/21/2023)   Humiliation, Afraid, Rape, and Kick questionnaire    Fear of Current or Ex-Partner: No    Emotionally Abused: No    Physically Abused: No    Sexually Abused: No     Allergies  Allergen Reactions   Morphine Nausea And Vomiting and Other (See Comments)    migranes Other reaction(s): Headache Migraine, vomiting   Sertraline Hcl     REACTION: Worsened symptoms of IBS   Sulfa Antibiotics Rash   Sulfamethoxazole Rash   Sulfonamide Derivatives Rash     CBC    Component Value Date/Time   WBC 9.2 06/01/2023 1051   RBC 5.12 (H) 06/01/2023 1051   HGB 14.7 06/01/2023 1051   HGB 14.3 08/23/2020 1033   HCT 46.0 06/01/2023 1051   HCT 42.9 08/23/2020 1033   PLT 216 06/01/2023 1051   PLT 222 08/23/2020 1033   MCV 89.8 06/01/2023 1051   MCV 91 08/23/2020 1033   MCH 28.7 06/01/2023 1051   MCHC 32.0 06/01/2023 1051   RDW 17.1 (H) 06/01/2023 1051   RDW 13.1 08/23/2020 1033   LYMPHSABS 2.4 06/01/2023 1051   LYMPHSABS 4.2 (H) 08/23/2020 1033   MONOABS 0.8 06/01/2023 1051   EOSABS 0.1 06/01/2023 1051   EOSABS 0.1 08/23/2020 1033   BASOSABS 0.1 06/01/2023 1051   BASOSABS 0.1 08/23/2020 1033    Pulmonary Functions Testing Results:    Latest Ref Rng & Units 09/11/2023    9:41 AM  12/07/2022    1:58 PM  PFT Results  FVC-Pre L 1.70  1.95   FVC-Predicted Pre % 56  64   FVC-Post L  1.94   FVC-Predicted Post %  64   Pre FEV1/FVC % % 76  64   Post FEV1/FCV % %  60   FEV1-Pre L 1.29  1.24   FEV1-Predicted Pre % 54  52   FEV1-Post L  1.17   DLCO uncorrected ml/min/mmHg 6.71  6.69   DLCO UNC% % 37  36   DLVA Predicted % 66  58   TLC L 2.37  1.57   TLC % Predicted % 53  35   RV % Predicted % 51  -17     Outpatient Medications Prior to Visit  Medication Sig Dispense Refill   acetaminophen (TYLENOL) 650 MG CR tablet Take 1,300 mg by mouth every 8 (eight) hours as needed for pain.     albuterol (PROVENTIL) (2.5 MG/3ML) 0.083% nebulizer solution Take 3 mLs (2.5 mg total) by nebulization every 6 (six) hours as needed for wheezing or shortness of breath. 360 mL 12   albuterol (VENTOLIN HFA) 108 (90 Base) MCG/ACT inhaler Inhale  2 puffs into the lungs every 6 (six) hours as needed for wheezing or shortness of breath. 6.7 g 0   anastrozole (ARIMIDEX) 1 MG tablet Take 1 tablet (1 mg total) by mouth daily. 30 tablet 5   b complex vitamins capsule Take 1 capsule by mouth daily.     baclofen (LIORESAL) 10 MG tablet Take 1 tablet (10 mg total) by mouth 3 (three) times daily as needed for muscle spasms. 60 tablet 2   calcium-vitamin D (OSCAL WITH D) 500-5 MG-MCG tablet Take 1 tablet by mouth daily with breakfast.     cyanocobalamin (VITAMIN B12) 1000 MCG/ML injection Inject 1 mL (1,000 mcg total) into the muscle every 30 (thirty) days. 3 mL 4   docusate sodium (COLACE) 100 MG capsule Take 100 mg by mouth daily as needed for mild constipation.     FLUoxetine (PROZAC) 40 MG capsule Take 1 capsule (40 mg total) by mouth daily. 90 capsule 3   gentamicin ointment (GARAMYCIN) 0.1 % Apply to nose 3 (three) times daily. 30 g 12   ibuprofen (ADVIL) 400 MG tablet Take 400 mg by mouth every 6 (six) hours as needed.     Lactobacillus (PROBIOTIC ACIDOPHILUS PO) Take 1 capsule by mouth daily.      LORazepam (ATIVAN) 0.5 MG tablet Take 1 tablet (0.5 mg total) by mouth 2 (two) times daily as needed for anxiety. 60 tablet 2   nicotine (NICOTINE STEP 1) 21 mg/24hr patch Place 1 patch (21 mg total) onto the skin daily. 28 patch 1   Oxycodone HCl 10 MG TABS Take 1 tablet (10mg ) by mouth once every four hours as needed for pain. Stop Percocet due to headaches. 180 tablet 0   Oxycodone HCl 10 MG TABS Take 1 tablet (10 mg total) by mouth every 4 (four) hours as needed for pain. 180 tablet 0   Oxycodone HCl 10 MG TABS Take 1 tablet (10 mg total) by mouth every 4 (four) hours as needed for pain 18 tablet 0   Oxycodone HCl 10 MG TABS Take 1 tablet (10 mg total) by mouth every 4 (four) hours as needed for pain. 180 tablet 0   Oxycodone HCl 10 MG TABS Take 1 tablet (10 mg total) by mouth every 4 (four) hours as needed for pain 180 tablet 0   [START ON 01/03/2024] Oxycodone HCl 10 MG TABS Take 1 tablet (10 mg total) by mouth every 4 (four) hours as needed for pain. 180 tablet 0   Oxycodone HCl 10 MG TABS Take 1 tablet (10 mg total) by mouth every 4 (four) hours as needed for pain. 180 tablet 0   pregabalin (LYRICA) 150 MG capsule Take 1 capsule (150 mg total) by mouth in the morning, at noon, and at bedtime. 90 capsule 2   rosuvastatin (CRESTOR) 10 MG tablet Take 1 tablet (10 mg total) by mouth daily. (Patient taking differently: Take 10 mg by mouth daily. Pt is taking 5 mg of Crestor instead of the 10 mg) 90 tablet 3   trimethoprim (TRIMPEX) 100 MG tablet Take 1 tablet (100 mg total) by mouth daily. 90 tablet 3   umeclidinium-vilanterol (ANORO ELLIPTA) 62.5-25 MCG/ACT AEPB Inhale 1 puff into the lungs daily. 60 each 6   vitamin B-12 (CYANOCOBALAMIN) 100 MCG tablet Take 100 mcg by mouth daily. gummy     Facility-Administered Medications Prior to Visit  Medication Dose Route Frequency Provider Last Rate Last Admin   acetaminophen (TYLENOL) 325 MG tablet  diphenhydrAMINE (BENADRYL) 25 mg capsule             goserelin (ZOLADEX) injection 3.6 mg  3.6 mg Subcutaneous Q28 days Creig Hines, MD   3.6 mg at 02/02/23 1206   heparin lock flush 100 unit/mL  500 Units Intravenous Once Creig Hines, MD       Zoledronic Acid (ZOMETA) 4 MG/100ML IVPB

## 2023-12-07 ENCOUNTER — Ambulatory Visit: Payer: Commercial Managed Care - HMO

## 2023-12-10 ENCOUNTER — Ambulatory Visit: Payer: Commercial Managed Care - HMO

## 2023-12-14 ENCOUNTER — Other Ambulatory Visit: Payer: Self-pay

## 2023-12-24 ENCOUNTER — Ambulatory Visit (INDEPENDENT_AMBULATORY_CARE_PROVIDER_SITE_OTHER): Payer: Commercial Managed Care - HMO

## 2023-12-24 DIAGNOSIS — Z23 Encounter for immunization: Secondary | ICD-10-CM | POA: Diagnosis not present

## 2023-12-24 NOTE — Progress Notes (Signed)
 Patient is in office today for a nurse visit for Immunization. Patient Injection was given in the  Right deltoid. Patient tolerated injection well.   Pt would like to know how often should she get Pneumonia vaccines. Pt would like for PCP or Jenate, [name] to send  her a mychart msg.

## 2023-12-24 NOTE — Patient Instructions (Signed)
Pneumococcal Conjugate Vaccine: What You Need to Know Many vaccine information statements are available in Spanish and other languages. See PromoAge.com.br. 1. Why get vaccinated? Pneumococcal conjugate vaccine can prevent pneumococcal disease. Pneumococcal disease refers to any illness caused by pneumococcal bacteria. These bacteria can cause many types of illnesses, including pneumonia, which is an infection of the lungs. Pneumococcal bacteria are one of the most common causes of pneumonia. Besides pneumonia, pneumococcal bacteria can also cause: Ear infections Sinus infections Meningitis (infection of the tissue covering the brain and spinal cord) Bacteremia (infection of the blood) Anyone can get pneumococcal disease, but children under 13 years old, people with certain medical conditions or other risk factors, and adults 65 years or older are at the highest risk. Most pneumococcal infections are mild. However, some can result in long-term problems, such as brain damage or hearing loss. Meningitis, bacteremia, and pneumonia caused by pneumococcal disease can be fatal. 2. Pneumococcal conjugate vaccine Pneumococcal conjugate vaccine helps protect against bacteria that cause pneumococcal disease. There are three pneumococcal conjugate vaccines (PCV13, PCV15, and PCV20). The different vaccines are recommended for different people based on age and medical status. Your health care provider can help you determine which type of pneumococcal conjugate vaccine, and how many doses, you should receive. Infants and young children usually need 4 doses of pneumococcal conjugate vaccine. These doses are recommended at 2, 4, 6, and 58-29 months of age. Older children and adolescents might need pneumococcal conjugate vaccine depending on their age and medical conditions or other risk factors if they did not receive the recommended doses as infants or young children. Adults 19 through 11 years old with certain  medical conditions or other risk factors who have not already received pneumococcal conjugate vaccine should receive pneumococcal conjugate vaccine. Adults 65 years or older who have not previously received pneumococcal conjugate vaccine should receive pneumococcal conjugate vaccine. Some people with certain medical conditions are also recommended to receive pneumococcal polysaccharide vaccine (a different type of pneumococcal vaccine known as PPSV23). Some adults who have previously received a pneumococcal conjugate vaccine may be recommended to receive another pneumococcal conjugate vaccine. 3. Talk with your health care provider Tell your vaccination provider if the person getting the vaccine: Has had an allergic reaction after a previous dose of any type of pneumococcal conjugate vaccine (PCV13, PCV15, PCV20, or an earlier pneumococcal conjugate vaccine known as PCV7), or to any vaccine containing diphtheria toxoid (for example, DTaP), or has any severe, life-threatening allergies In some cases, your health care provider may decide to postpone pneumococcal conjugate vaccination until a future visit. People with minor illnesses, such as a cold, may be vaccinated. People who are moderately or severely ill should usually wait until they recover. Your health care provider can give you more information. 4. Risks of a vaccine reaction Redness, swelling, pain, or tenderness where the shot is given, and fever, loss of appetite, fussiness (irritability), feeling tired, headache, muscle aches, joint pain, and chills can happen after pneumococcal conjugate vaccination. Young children may be at increased risk for seizures caused by fever after a pneumococcal conjugate vaccine if it is administered at the same time as inactivated influenza vaccine. Ask your health care provider for more information. People sometimes faint after medical procedures, including vaccination. Tell your provider if you feel dizzy or  have vision changes or ringing in the ears. As with any medicine, there is a very remote chance of a vaccine causing a severe allergic reaction, other serious injury, or death. 5.  What if there is a serious problem? An allergic reaction could occur after the vaccinated person leaves the clinic. If you see signs of a severe allergic reaction (hives, swelling of the face and throat, difficulty breathing, a fast heartbeat, dizziness, or weakness), call 9-1-1 and get the person to the nearest hospital. For other signs that concern you, call your health care provider. Adverse reactions should be reported to the Vaccine Adverse Event Reporting System (VAERS). Your health care provider will usually file this report, or you can do it yourself. Visit the VAERS website at www.vaers.LAgents.no or call 913-439-3438. VAERS is only for reporting reactions, and VAERS staff members do not give medical advice. 6. The National Vaccine Injury Compensation Program The [employer] Vaccine Injury Compensation Program (VICP) is a federal program that was created to compensate people who may have been injured by certain vaccines. Claims regarding alleged injury or death due to vaccination have a time limit for filing, which may be as short as two years. Visit the VICP website at SpiritualWord.at or call 860 253 0829 to learn about the program and about filing a claim. 7. How can I learn more? Ask your health care provider. Call your local or state health department. Visit the website of the Food and Drug Administration (FDA) for vaccine package inserts and additional information at FinderList.no. Contact the Centers for Disease Control and Prevention (CDC): Call 4055360835 (1-800-CDC-INFO) or Visit CDC's website at PicCapture.uy. Source: CDC Vaccine Information Statement (Interim) Pneumococcal Conjugate Vaccine (03/03/2022) This same material is available at  FootballExhibition.com.br for no charge. This information is not intended to replace advice given to you by your health care provider. Make sure you discuss any questions you have with your health care provider. Document Revised: 01/24/2023 Document Reviewed: 10/30/2022 Elsevier Patient Education  2024 [employer].

## 2023-12-25 ENCOUNTER — Encounter: Payer: Self-pay | Admitting: Family

## 2023-12-26 MED FILL — Anastrozole Tab 1 MG: ORAL | 30 days supply | Qty: 30 | Fill #3 | Status: AC

## 2023-12-26 MED FILL — Nicotine TD Patch 24HR 21 MG/24HR: TRANSDERMAL | 28 days supply | Qty: 28 | Fill #1 | Status: AC

## 2023-12-28 ENCOUNTER — Other Ambulatory Visit: Payer: Self-pay

## 2024-01-02 ENCOUNTER — Other Ambulatory Visit: Payer: Self-pay | Admitting: Student in an Organized Health Care Education/Training Program

## 2024-01-02 ENCOUNTER — Other Ambulatory Visit: Payer: Self-pay

## 2024-01-02 DIAGNOSIS — J189 Pneumonia, unspecified organism: Secondary | ICD-10-CM

## 2024-01-02 MED ORDER — ANORO ELLIPTA 62.5-25 MCG/ACT IN AEPB
1.0000 | INHALATION_SPRAY | Freq: Every day | RESPIRATORY_TRACT | 6 refills | Status: DC
  Filled 2024-01-02: qty 60, 30d supply, fill #0
  Filled 2024-01-30: qty 60, 30d supply, fill #1
  Filled 2024-04-22: qty 60, 30d supply, fill #2

## 2024-01-02 MED FILL — Lorazepam Tab 0.5 MG: ORAL | 30 days supply | Qty: 60 | Fill #2 | Status: AC

## 2024-01-04 ENCOUNTER — Ambulatory Visit: Payer: Commercial Managed Care - HMO | Admitting: Urology

## 2024-01-14 ENCOUNTER — Other Ambulatory Visit: Payer: Self-pay

## 2024-01-14 MED ORDER — OXYCODONE HCL 10 MG PO TABS
10.0000 mg | ORAL_TABLET | ORAL | 0 refills | Status: DC | PRN
Start: 2024-02-28 — End: 2024-07-08

## 2024-01-14 MED ORDER — OXYCODONE HCL 10 MG PO TABS
10.0000 mg | ORAL_TABLET | ORAL | 0 refills | Status: DC | PRN
  Filled 2024-02-29: qty 180, 30d supply, fill #0

## 2024-01-23 ENCOUNTER — Other Ambulatory Visit: Payer: Self-pay

## 2024-01-23 ENCOUNTER — Other Ambulatory Visit: Payer: Self-pay | Admitting: Family

## 2024-01-23 DIAGNOSIS — T451X5A Adverse effect of antineoplastic and immunosuppressive drugs, initial encounter: Secondary | ICD-10-CM

## 2024-01-23 MED ORDER — CYANOCOBALAMIN 1000 MCG/ML IJ SOLN
1000.0000 ug | INTRAMUSCULAR | 4 refills | Status: DC
  Filled 2024-01-23: qty 3, 90d supply, fill #0

## 2024-01-23 MED FILL — Anastrozole Tab 1 MG: ORAL | 30 days supply | Qty: 30 | Fill #4 | Status: AC

## 2024-01-25 ENCOUNTER — Other Ambulatory Visit: Payer: Self-pay

## 2024-01-30 ENCOUNTER — Other Ambulatory Visit: Payer: Self-pay

## 2024-02-20 ENCOUNTER — Encounter: Payer: Self-pay | Admitting: Family

## 2024-02-20 ENCOUNTER — Ambulatory Visit: Payer: Commercial Managed Care - HMO | Admitting: Family

## 2024-02-20 ENCOUNTER — Other Ambulatory Visit: Payer: Self-pay

## 2024-02-20 VITALS — BP 108/70 | HR 64 | Temp 98.0°F | Ht 60.0 in | Wt 114.2 lb

## 2024-02-20 DIAGNOSIS — E785 Hyperlipidemia, unspecified: Secondary | ICD-10-CM

## 2024-02-20 DIAGNOSIS — J849 Interstitial pulmonary disease, unspecified: Secondary | ICD-10-CM | POA: Diagnosis not present

## 2024-02-20 DIAGNOSIS — J9621 Acute and chronic respiratory failure with hypoxia: Secondary | ICD-10-CM

## 2024-02-20 DIAGNOSIS — M545 Low back pain, unspecified: Secondary | ICD-10-CM | POA: Diagnosis not present

## 2024-02-20 DIAGNOSIS — J984 Other disorders of lung: Secondary | ICD-10-CM

## 2024-02-20 DIAGNOSIS — Z720 Tobacco use: Secondary | ICD-10-CM | POA: Diagnosis not present

## 2024-02-20 DIAGNOSIS — F411 Generalized anxiety disorder: Secondary | ICD-10-CM | POA: Diagnosis not present

## 2024-02-20 MED ORDER — NICOTINE 7 MG/24HR TD PT24
7.0000 mg | MEDICATED_PATCH | Freq: Every day | TRANSDERMAL | 0 refills | Status: DC
  Filled 2024-02-20: qty 28, 28d supply, fill #0

## 2024-02-20 MED ORDER — NICOTINE POLACRILEX 2 MG MT GUM
2.0000 mg | CHEWING_GUM | OROMUCOSAL | 0 refills | Status: DC | PRN
  Filled 2024-02-20: qty 110, 25d supply, fill #0

## 2024-02-20 MED ORDER — PRAVASTATIN SODIUM 10 MG PO TABS
10.0000 mg | ORAL_TABLET | Freq: Every day | ORAL | 3 refills | Status: DC
  Filled 2024-02-20: qty 90, 90d supply, fill #0

## 2024-02-20 MED ORDER — LORAZEPAM 0.5 MG PO TABS
0.5000 mg | ORAL_TABLET | Freq: Two times a day (BID) | ORAL | 2 refills | Status: DC | PRN
  Filled 2024-02-20: qty 60, 30d supply, fill #0
  Filled 2024-03-29: qty 60, 30d supply, fill #1
  Filled 2024-04-22 – 2024-05-01 (×2): qty 60, 30d supply, fill #2

## 2024-02-20 MED ORDER — PREGABALIN 150 MG PO CAPS
150.0000 mg | ORAL_CAPSULE | Freq: Three times a day (TID) | ORAL | 2 refills | Status: DC
  Filled 2024-02-20 – 2024-02-25 (×2): qty 90, 30d supply, fill #0
  Filled 2024-03-29: qty 90, 30d supply, fill #1
  Filled 2024-04-22 – 2024-05-01 (×2): qty 90, 30d supply, fill #2
  Filled ????-??-??: fill #0

## 2024-02-20 NOTE — Progress Notes (Signed)
 Assessment & Plan:  Anxiety state Assessment & Plan: Chronic, improved on Prozac  40 mg.  Continue Ativan  0.5 mg twice daily prn, I have refilled.   Orders: -     LORazepam ; Take 1 tablet (0.5 mg total) by mouth 2 (two) times daily as needed for anxiety.  Dispense: 60 tablet; Refill: 2  Right-sided low back pain without sciatica, unspecified chronicity -     Pregabalin ; Take 1 capsule (150 mg total) by mouth in the morning, at noon, and at bedtime.  Dispense: 90 capsule; Refill: 2  Tobacco abuse Assessment & Plan: Counseled on smoking cessation.  Decreased nicotine  patch to 7 mg.  Provided nicotine  gum 2mg .  She is smoking 1 to 5 cigarettes/day.  Orders: -     Nicotine ; Place 1 patch (7 mg total) onto the skin daily.  Dispense: 28 patch; Refill: 0 -     Nicotine  Polacrilex; Take 1 each (2 mg total) by mouth as needed for smoking cessation.  Dispense: 110 tablet; Refill: 0  ILD (interstitial lung disease) (HCC) -     AMB referral to pulmonary rehabilitation  Pneumonitis -     AMB referral to pulmonary rehabilitation  Acute on chronic respiratory failure with hypoxia (HCC) Assessment & Plan: Chronic, symptomatically stable on exam today.  Sent secure chat to Dr. Darnelle Elders in regards to pulmonary rehab which he was very agreeable.  I placed this referral today.  Orders: -     AMB referral to pulmonary rehabilitation  Hyperlipidemia, unspecified hyperlipidemia type Assessment & Plan: Arthralgias with Crestor  5 mg and Crestor  10 mg.  Trial of pravastatin  10mg   once weekly and slowly titrate.  Will obtain lipid panel, hepatic enzymes at follow-up.   Other orders -     Pravastatin  Sodium; Take 1 tablet (10 mg total) by mouth daily.  Dispense: 90 tablet; Refill: 3     Return precautions given.   Risks, benefits, and alternatives of the medications and treatment plan prescribed today were discussed, and patient expressed understanding.   Education regarding symptom management and  diagnosis given to patient on AVS either electronically or printed.  Return in about 3 months (around 05/21/2024).  Bascom Bossier, FNP  Subjective:    Patient ID: Tricia Berry, female    DOB: 09-24-1971, 53 y.o.   MRN: [id number]  CC: Tricia Berry is a 53 y.o. female who presents today for follow up.   HPI: Overall feels well today.  Shortness of breath at baseline.  She describes quality life as relates to shortness of breath.  She cannot formally exercise.  She can walk outside for very short distances.  Continues to use oxygen while sleeping, or with activity prn.    Compliant with with Prozac  40 mg daily  and she feeling better, anxiety is improved. Denies depression.    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  Smoking 1-5 cigarettes per day  She is using nicotine  patch.    Allergies: Morphine, Sertraline hcl, Sulfa antibiotics, Sulfamethoxazole, and Sulfonamide derivatives Current Outpatient Medications on File Prior to Visit  Medication Sig Dispense Refill   acetaminophen  (TYLENOL ) 650 MG CR tablet Take 1,300 mg by mouth every 8 (eight) hours as needed for pain.     albuterol  (PROVENTIL ) (2.5 MG/3ML) 0.083% nebulizer solution Take 3 mLs (2.5  mg total) by nebulization every 6 (six) hours as needed for wheezing or shortness of breath. 360 mL 12   albuterol  (VENTOLIN  HFA) 108 (90 Base) MCG/ACT inhaler Inhale 2 puffs into the lungs every 6 (six) hours as needed for wheezing or shortness of breath. 6.7 g 0   anastrozole  (ARIMIDEX ) 1 MG tablet Take 1 tablet (1 mg total) by mouth daily. 30 tablet 5   b complex vitamins capsule Take 1 capsule by mouth daily.     baclofen  (LIORESAL ) 10 MG tablet Take 1 tablet (10 mg total) by mouth 3 (three) times daily  as needed for muscle spasms. 60 tablet 2   calcium -vitamin D  (OSCAL WITH D) 500-5 MG-MCG tablet Take 1 tablet by mouth daily with breakfast.     cyanocobalamin  (VITAMIN B12) 1000 MCG/ML injection Inject 1 mL (1,000 mcg total) into the muscle every 30 (thirty) days. 3 mL 4   docusate sodium  (COLACE) 100 MG capsule Take 100 mg by mouth daily as needed for mild constipation.     FLUoxetine  (PROZAC ) 40 MG capsule Take 1 capsule (40 mg total) by mouth daily. 90 capsule 3   gentamicin  ointment (GARAMYCIN ) 0.1 % Apply to nose 3 (three) times daily. 30 g 12   ibuprofen  (ADVIL ) 400 MG tablet Take 400 mg by mouth every 6 (six) hours as needed.     Lactobacillus (PROBIOTIC ACIDOPHILUS PO) Take 1 capsule by mouth daily.     lidocaine -prilocaine  (EMLA ) cream Apply topically.     Oxycodone  HCl 10 MG TABS Take 1 tablet (10mg ) by mouth once every four hours as needed for pain. Stop Percocet due to headaches. 180 tablet 0   Oxycodone  HCl 10 MG TABS Take 1 tablet (10 mg total) by mouth every 4 (four) hours as needed for pain. 180 tablet 0   Oxycodone  HCl 10 MG TABS Take 1 tablet (10 mg total) by mouth every 4 (four) hours as needed for pain 18 tablet 0   Oxycodone  HCl 10 MG TABS Take 1 tablet (10 mg total) by mouth every 4 (four) hours as needed for pain. 180 tablet 0   Oxycodone  HCl 10 MG TABS Take 1 tablet (10 mg total) by mouth every 4 (four) hours as needed for pain 180 tablet 0   Oxycodone  HCl 10 MG TABS Take 1 tablet (10 mg total) by mouth every 4 (four) hours as needed for pain. 180 tablet 0   Oxycodone  HCl 10 MG TABS Take 1 tablet (10 mg total) by mouth every 4 (four) hours as needed for pain. 180 tablet 0   [START ON 02/28/2024] Oxycodone  HCl 10 MG TABS Take 1 tablet (10 mg total) by mouth every 4 (four) hours as needed for pain. 180 tablet 0   Oxycodone  HCl 10 MG TABS Take 1 tablet (10 mg total) by mouth every 4 (four) hours as needed. 180 tablet 0   trimethoprim  (TRIMPEX ) 100 MG tablet Take 1 tablet (100 mg  total) by mouth daily. 90 tablet 3   umeclidinium-vilanterol (ANORO ELLIPTA ) 62.5-25 MCG/ACT AEPB Inhale 1 puff into the lungs daily. 60 each 6   vitamin B-12 (CYANOCOBALAMIN ) 100 MCG tablet Take 100 mcg by mouth daily. gummy     Current Facility-Administered Medications on File Prior to Visit  Medication Dose Route Frequency Provider Last Rate Last Admin   acetaminophen  (TYLENOL ) 325 MG tablet            diphenhydrAMINE  (BENADRYL ) 25 mg capsule  goserelin (ZOLADEX ) injection 3.6 mg  3.6 mg Subcutaneous Q28 days Avonne Boettcher, MD   3.6 mg at 02/02/23 1206   heparin  lock flush 100 unit/mL  500 Units Intravenous Once Rao, Archana C, MD       Zoledronic  Acid (ZOMETA ) 4 MG/100ML IVPB             Review of Systems  Constitutional:  Negative for chills and fever.  Respiratory:  Positive for shortness of breath (at baseline). Negative for cough.   Cardiovascular:  Negative for chest pain and palpitations.  Gastrointestinal:  Negative for nausea and vomiting.  Musculoskeletal:  Positive for back pain (chronic).  Psychiatric/Behavioral:  Negative for suicidal ideas. The patient is not nervous/anxious.       Objective:    BP 108/70   Pulse 64   Temp 98 F (36.7 C) (Oral)   Ht 5' (1.524 m)   Wt 114 lb 3.2 oz (51.8 kg)   SpO2 98%   BMI 22.30 kg/m  BP Readings from Last 3 Encounters:  02/20/24 108/70  12/06/23 102/70  11/23/23 122/80   Wt Readings from Last 3 Encounters:  02/20/24 114 lb 3.2 oz (51.8 kg)  12/06/23 121 lb (54.9 kg)  11/23/23 122 lb 6.4 oz (55.5 kg)      11/23/2023   10:40 AM 08/23/2023   11:14 AM 05/02/2023    8:26 AM  Depression screen PHQ 2/9  Decreased Interest 0 0 0  Down, Depressed, Hopeless 0 0 0  PHQ - 2 Score 0 0 0  Altered sleeping   0  Tired, decreased energy   0  Change in appetite   0  Feeling bad or failure about yourself    0  Trouble concentrating   0  Moving slowly or fidgety/restless   0  Suicidal thoughts   0  PHQ-9 Score   0   Difficult doing work/chores   Not difficult at all    Physical Exam Vitals reviewed.  Constitutional:      Appearance: She is well-developed.  Eyes:     Conjunctiva/sclera: Conjunctivae normal.  Cardiovascular:     Rate and Rhythm: Normal rate and regular rhythm.     Pulses: Normal pulses.     Heart sounds: Normal heart sounds.  Pulmonary:     Effort: Pulmonary effort is normal.     Breath sounds: Normal breath sounds. No wheezing, rhonchi or rales.  Skin:    General: Skin is warm and dry.  Neurological:     Mental Status: She is alert.  Psychiatric:        Speech: Speech normal.        Behavior: Behavior normal.        Thought Content: Thought content normal.

## 2024-02-20 NOTE — Patient Instructions (Addendum)
 You may stop Crestor .    Please start pravastatin 10 mg weekly for the next couple weeks.  Gradually increase every couple of weeks week until you are taking daily.  Will recheck lipid panel in 3 months  Referral to pulmonary rehab.   Let us  know if you dont hear back within a week in regards to an appointment being scheduled.   So that you are aware, if you are [facility] MyChart user , please pay attention to your MyChart messages as you may receive a MyChart message with a phone number to call and schedule this test/appointment own your own from our referral coordinator. This is a new process so I do not want you to miss this message.  If you are not a MyChart user, you will receive a phone call.    Nice to see you today.

## 2024-02-22 NOTE — Assessment & Plan Note (Signed)
 Chronic, symptomatically stable on exam today.  Sent secure chat to Dr. Darnelle Elders in regards to pulmonary rehab which he was very agreeable.  I placed this referral today.

## 2024-02-22 NOTE — Assessment & Plan Note (Signed)
 Arthralgias with Crestor  5 mg and Crestor  10 mg.  Trial of pravastatin  10mg   once weekly and slowly titrate.  Will obtain lipid panel, hepatic enzymes at follow-up.

## 2024-02-22 NOTE — Assessment & Plan Note (Signed)
 Chronic, improved on Prozac  40 mg.  Continue Ativan  0.5 mg twice daily prn, I have refilled.

## 2024-02-22 NOTE — Assessment & Plan Note (Signed)
 Counseled on smoking cessation.  Decreased nicotine  patch to 7 mg.  Provided nicotine  gum 2mg .  She is smoking 1 to 5 cigarettes/day.

## 2024-02-24 MED FILL — Anastrozole Tab 1 MG: ORAL | 30 days supply | Qty: 30 | Fill #5 | Status: AC

## 2024-02-25 ENCOUNTER — Other Ambulatory Visit: Payer: Self-pay | Admitting: *Deleted

## 2024-02-25 ENCOUNTER — Other Ambulatory Visit: Payer: Self-pay

## 2024-02-25 DIAGNOSIS — J849 Interstitial pulmonary disease, unspecified: Secondary | ICD-10-CM

## 2024-02-26 ENCOUNTER — Other Ambulatory Visit: Payer: Self-pay

## 2024-02-27 ENCOUNTER — Other Ambulatory Visit: Payer: Self-pay

## 2024-02-27 MED ORDER — OXYCODONE HCL 10 MG PO TABS
10.0000 mg | ORAL_TABLET | ORAL | 0 refills | Status: DC | PRN
  Filled 2024-02-27: qty 90, 15d supply, fill #0

## 2024-02-29 ENCOUNTER — Ambulatory Visit: Admitting: Plastic Surgery

## 2024-02-29 ENCOUNTER — Other Ambulatory Visit: Payer: Self-pay

## 2024-02-29 ENCOUNTER — Encounter: Payer: Self-pay | Admitting: Plastic Surgery

## 2024-02-29 VITALS — BP 101/64 | HR 84 | Ht 60.0 in | Wt 118.4 lb

## 2024-02-29 DIAGNOSIS — Z853 Personal history of malignant neoplasm of breast: Secondary | ICD-10-CM

## 2024-02-29 DIAGNOSIS — N651 Disproportion of reconstructed breast: Secondary | ICD-10-CM | POA: Diagnosis not present

## 2024-02-29 DIAGNOSIS — Z923 Personal history of irradiation: Secondary | ICD-10-CM

## 2024-02-29 NOTE — Progress Notes (Signed)
 Patient ID: Tricia Berry, female    DOB: 20-Mar-1971, 53 y.o.   MRN: [id number]   Chief Complaint  Patient presents with   Follow-up    The patient is a 53 year old female here for a 1 year follow-up after breast reconstruction.  She had been diagnosed with left breast invasive carcinoma in 2022.  The lesion was estrogen and progesterone positive and HER2 negative.  She underwent bilateral mastectomies with expander placement in 2022 and then had implants placed in July 2022.  She has 320 cc saline implants in place.  She had radiation to the left breast.  The last surgery was for capsulectomy of the right side and that was done in December 2023.  She is still smoking although she is working on quitting and is now down to about 1 cigarette/week.  She would like to see if she can have more symmetry and this would be possible with fat grafting.    Review of Systems  Constitutional: Negative.   HENT: Negative.    Eyes: Negative.   Respiratory: Negative.    Cardiovascular: Negative.   Gastrointestinal: Negative.   Endocrine: Negative.   Genitourinary: Negative.   Musculoskeletal: Negative.     Past Medical History:  Diagnosis Date   Anemia    Anginal pain (HCC)    excertion not cardiac related per patient   Anxiety    Breast cancer (HCC) 12/2021   left breast IMC   Diverticulitis    Dyspnea    Dysrhythmia    prolong QT   Family history of ovarian cancer    GERD (gastroesophageal reflux disease)    Headache    IBS (irritable bowel syndrome)    Neuromuscular disorder (HCC)    neuropathy from chemo   Pneumonia    PONV (postoperative nausea and vomiting)    severe migraine and vomiting post anesthesia   Scoliosis     Past Surgical History:  Procedure Laterality Date   ABDOMINAL HYSTERECTOMY     still has ovaries, no gyn cancer, hysterectomy due to endometriosis. NO cervix on exam 01/10/21   BACK SURGERY     BREAST BIOPSY Left 09/15/2020   us  bx of mass, path  pending, Q marker   BREAST BIOPSY Left 09/15/2020   us  bx of LN, hydromarker, path pending   BREAST RECONSTRUCTION WITH PLACEMENT OF TISSUE EXPANDER AND FLEX HD (ACELLULAR HYDRATED DERMIS) Bilateral 03/17/2021   Procedure: IMMEDIATE BILATERAL BREAST RECONSTRUCTION WITH PLACEMENT OF TISSUE EXPANDER AND FLEX HD (ACELLULAR HYDRATED DERMIS);  Surgeon: Thornell Flirt, DO;  Location: [facility];  Service: Plastics;  Laterality: Bilateral;   CAPSULECTOMY Right 10/18/2022   Procedure: Right breast capsule release with adjustment;  Surgeon: Thornell Flirt, DO;  Location: [facility];  Service: Plastics;  Laterality: Right;   COLONOSCOPY WITH PROPOFOL  N/A 11/21/2021   Procedure: COLONOSCOPY WITH PROPOFOL ;  Surgeon: Selena Daily, MD;  Location: [facility];  Service: Gastroenterology;  Laterality: N/A;   FLEXIBLE BRONCHOSCOPY Bilateral 12/01/2022   Procedure: FLEXIBLE BRONCHOSCOPY;  Surgeon: Vergia Glasgow, MD;  Location: [facility];  Service: Pulmonary;  Laterality: Bilateral;   HAND SURGERY Right 10/2023   IR IMAGING GUIDED PORT INSERTION  10/01/2020   MODIFIED MASTECTOMY Left 03/17/2021   Procedure: LEFT MODIFIED RADICAL MASTECTOMY;  Surgeon: Sim Dryer, MD;  Location: [facility];  Service: General;  Laterality: Left;   PORTA CATH REMOVAL Right 03/17/2021   Procedure: PORTA CATH REMOVAL;  Surgeon: Sim Dryer,  MD;  Location: [facility];  Service: General;  Laterality: Right;   REMOVAL OF BILATERAL TISSUE EXPANDERS WITH PLACEMENT OF BILATERAL BREAST IMPLANTS Bilateral 05/09/2021   Procedure: REMOVAL OF BILATERAL TISSUE EXPANDERS WITH PLACEMENT OF BILATERAL BREAST IMPLANTS;  Surgeon: Thornell Flirt, DO;  Location: [facility];  Service: Plastics;  Laterality: Bilateral;  90 min   saginal band repair and capsule release  10/2023   TOTAL MASTECTOMY Right 03/17/2021   Procedure: TOTAL MASTECTOMY;   Surgeon: Sim Dryer, MD;  Location: [facility];  Service: General;  Laterality: Right;      Current Outpatient Medications:    acetaminophen  (TYLENOL ) 650 MG CR tablet, Take 1,300 mg by mouth every 8 (eight) hours as needed for pain., Disp: , Rfl:    albuterol  (PROVENTIL ) (2.5 MG/3ML) 0.083% nebulizer solution, Take 3 mLs (2.5 mg total) by nebulization every 6 (six) hours as needed for wheezing or shortness of breath., Disp: 360 mL, Rfl: 12   albuterol  (VENTOLIN  HFA) 108 (90 Base) MCG/ACT inhaler, Inhale 2 puffs into the lungs every 6 (six) hours as needed for wheezing or shortness of breath., Disp: 6.7 g, Rfl: 0   anastrozole  (ARIMIDEX ) 1 MG tablet, Take 1 tablet (1 mg total) by mouth daily., Disp: 30 tablet, Rfl: 5   b complex vitamins capsule, Take 1 capsule by mouth daily., Disp: , Rfl:    baclofen  (LIORESAL ) 10 MG tablet, Take 1 tablet (10 mg total) by mouth 3 (three) times daily as needed for muscle spasms., Disp: 60 tablet, Rfl: 2   calcium -vitamin D  (OSCAL WITH D) 500-5 MG-MCG tablet, Take 1 tablet by mouth daily with breakfast., Disp: , Rfl:    cyanocobalamin  (VITAMIN B12) 1000 MCG/ML injection, Inject 1 mL (1,000 mcg total) into the muscle every 30 (thirty) days., Disp: 3 mL, Rfl: 4   docusate sodium  (COLACE) 100 MG capsule, Take 100 mg by mouth daily as needed for mild constipation., Disp: , Rfl:    FLUoxetine  (PROZAC ) 40 MG capsule, Take 1 capsule (40 mg total) by mouth daily., Disp: 90 capsule, Rfl: 3   gentamicin  ointment (GARAMYCIN ) 0.1 %, Apply to nose 3 (three) times daily., Disp: 30 g, Rfl: 12   ibuprofen  (ADVIL ) 400 MG tablet, Take 400 mg by mouth every 6 (six) hours as needed., Disp: , Rfl:    Lactobacillus (PROBIOTIC ACIDOPHILUS PO), Take 1 capsule by mouth daily., Disp: , Rfl:    lidocaine -prilocaine  (EMLA ) cream, Apply topically., Disp: , Rfl:    LORazepam  (ATIVAN ) 0.5 MG tablet, Take 1 tablet (0.5 mg total) by mouth 2 (two) times daily as needed for  anxiety., Disp: 60 tablet, Rfl: 2   nicotine  (NICODERM CQ  - DOSED IN MG/24 HR) 7 mg/24hr patch, Place 1 patch (7 mg total) onto the skin daily., Disp: 28 patch, Rfl: 0   nicotine  polacrilex (NICORETTE ) 2 MG gum, Take 1 each (2 mg total) by mouth as needed for smoking cessation., Disp: 110 tablet, Rfl: 0   Oxycodone  HCl 10 MG TABS, Take 1 tablet (10mg ) by mouth once every four hours as needed for pain. Stop Percocet due to headaches., Disp: 180 tablet, Rfl: 0   Oxycodone  HCl 10 MG TABS, Take 1 tablet (10 mg total) by mouth every 4 (four) hours as needed for pain., Disp: 180 tablet, Rfl: 0   Oxycodone  HCl 10 MG TABS, Take 1 tablet (10 mg total) by mouth every 4 (four) hours as needed for pain, Disp: 18 tablet, Rfl: 0  Oxycodone  HCl 10 MG TABS, Take 1 tablet (10 mg total) by mouth every 4 (four) hours as needed for pain., Disp: 180 tablet, Rfl: 0   Oxycodone  HCl 10 MG TABS, Take 1 tablet (10 mg total) by mouth every 4 (four) hours as needed for pain, Disp: 180 tablet, Rfl: 0   Oxycodone  HCl 10 MG TABS, Take 1 tablet (10 mg total) by mouth every 4 (four) hours as needed for pain., Disp: 180 tablet, Rfl: 0   Oxycodone  HCl 10 MG TABS, Take 1 tablet (10 mg total) by mouth every 4 (four) hours as needed for pain., Disp: 180 tablet, Rfl: 0   Oxycodone  HCl 10 MG TABS, Take 1 tablet (10 mg total) by mouth every 4 (four) hours as needed., Disp: 180 tablet, Rfl: 0   Oxycodone  HCl 10 MG TABS, Take 1 tablet (10 mg total) by mouth every 4 (four) hours as needed for pain., Disp: 90 tablet, Rfl: 0   pravastatin  (PRAVACHOL ) 10 MG tablet, Take 1 tablet (10 mg total) by mouth daily., Disp: 90 tablet, Rfl: 3   pregabalin  (LYRICA ) 150 MG capsule, Take 1 capsule (150 mg total) by mouth in the morning, at noon, and at bedtime., Disp: 90 capsule, Rfl: 2   trimethoprim  (TRIMPEX ) 100 MG tablet, Take 1 tablet (100 mg total) by mouth daily., Disp: 90 tablet, Rfl: 3   umeclidinium-vilanterol (ANORO ELLIPTA ) 62.5-25 MCG/ACT AEPB,  Inhale 1 puff into the lungs daily., Disp: 60 each, Rfl: 6   vitamin B-12 (CYANOCOBALAMIN ) 100 MCG tablet, Take 100 mcg by mouth daily. gummy, Disp: , Rfl:  No current facility-administered medications for this visit.  Facility-Administered Medications Ordered in Other Visits:    acetaminophen  (TYLENOL ) 325 MG tablet, , , ,    diphenhydrAMINE  (BENADRYL ) 25 mg capsule, , , ,    goserelin (ZOLADEX ) injection 3.6 mg, 3.6 mg, Subcutaneous, Q28 days, Avonne Boettcher, MD, 3.6 mg at 02/02/23 1206   heparin  lock flush 100 unit/mL, 500 Units, Intravenous, Once, Avonne Boettcher, MD   Zoledronic  Acid (ZOMETA ) 4 MG/100ML IVPB, , , ,    Objective:   Vitals:   02/29/24 1115  BP: 101/64  Pulse: 84  SpO2: 94%    Physical Exam Constitutional:      Appearance: Normal appearance.  HENT:     Head: Atraumatic.  Cardiovascular:     Rate and Rhythm: Normal rate.     Pulses: Normal pulses.  Pulmonary:     Effort: Pulmonary effort is normal.  Abdominal:     Palpations: Abdomen is soft.  Skin:    General: Skin is warm.     Capillary Refill: Capillary refill takes less than 2 seconds.  Neurological:     Mental Status: She is alert and oriented to person, place, and time.  Psychiatric:        Mood and Affect: Mood normal.        Behavior: Behavior normal.        Thought Content: Thought content normal.        Judgment: Judgment normal.     Assessment & Plan:  Breast asymmetry following reconstructive surgery  Because of the radiation to the left side I do not think that we will gain much by tacking the right side up again.  I think the risk is too high because I know it is going to go down again in time.  I think fat grafting is reasonable and I think would make enough of the difference.  So we will plan for fat grafting bilateral breasts when the patient is 3 months tobacco free.  Lindaann Requena Amori Cooperman, DO

## 2024-03-10 ENCOUNTER — Other Ambulatory Visit: Payer: Self-pay

## 2024-03-10 MED ORDER — OXYCODONE HCL 10 MG PO TABS
10.0000 mg | ORAL_TABLET | ORAL | 0 refills | Status: DC | PRN
  Filled 2024-03-12: qty 180, 30d supply, fill #0

## 2024-03-10 MED ORDER — OXYCODONE HCL 10 MG PO TABS
10.0000 mg | ORAL_TABLET | ORAL | 0 refills | Status: DC | PRN
  Filled 2024-04-10: qty 90, 15d supply, fill #0

## 2024-03-11 ENCOUNTER — Other Ambulatory Visit: Payer: Self-pay

## 2024-03-12 ENCOUNTER — Other Ambulatory Visit: Payer: Self-pay

## 2024-03-25 ENCOUNTER — Other Ambulatory Visit: Payer: Self-pay

## 2024-03-25 ENCOUNTER — Encounter: Payer: Self-pay | Admitting: Oncology

## 2024-03-25 ENCOUNTER — Inpatient Hospital Stay: Payer: Commercial Managed Care - HMO | Attending: Oncology | Admitting: Oncology

## 2024-03-25 ENCOUNTER — Inpatient Hospital Stay: Payer: Commercial Managed Care - HMO

## 2024-03-25 VITALS — BP 101/70 | HR 70 | Temp 98.0°F | Resp 19 | Ht 60.0 in | Wt 113.9 lb

## 2024-03-25 DIAGNOSIS — C50212 Malignant neoplasm of upper-inner quadrant of left female breast: Secondary | ICD-10-CM | POA: Diagnosis present

## 2024-03-25 DIAGNOSIS — G620 Drug-induced polyneuropathy: Secondary | ICD-10-CM | POA: Insufficient documentation

## 2024-03-25 DIAGNOSIS — Z170 Estrogen receptor positive status [ER+]: Secondary | ICD-10-CM | POA: Diagnosis not present

## 2024-03-25 DIAGNOSIS — T451X5A Adverse effect of antineoplastic and immunosuppressive drugs, initial encounter: Secondary | ICD-10-CM | POA: Diagnosis not present

## 2024-03-25 DIAGNOSIS — Z79811 Long term (current) use of aromatase inhibitors: Secondary | ICD-10-CM | POA: Insufficient documentation

## 2024-03-25 DIAGNOSIS — C50412 Malignant neoplasm of upper-outer quadrant of left female breast: Secondary | ICD-10-CM | POA: Diagnosis not present

## 2024-03-25 DIAGNOSIS — Z853 Personal history of malignant neoplasm of breast: Secondary | ICD-10-CM | POA: Diagnosis not present

## 2024-03-25 DIAGNOSIS — Z08 Encounter for follow-up examination after completed treatment for malignant neoplasm: Secondary | ICD-10-CM

## 2024-03-25 DIAGNOSIS — Z79899 Other long term (current) drug therapy: Secondary | ICD-10-CM

## 2024-03-25 DIAGNOSIS — Z5181 Encounter for therapeutic drug level monitoring: Secondary | ICD-10-CM

## 2024-03-25 MED ORDER — TERBINAFINE HCL 1 % EX CREA
1.0000 | TOPICAL_CREAM | Freq: Two times a day (BID) | CUTANEOUS | 0 refills | Status: DC
  Filled 2024-03-25: qty 30, 30d supply, fill #0
  Filled 2024-03-25: qty 30, 15d supply, fill #0

## 2024-03-25 MED ORDER — ANASTROZOLE 1 MG PO TABS
1.0000 mg | ORAL_TABLET | Freq: Every day | ORAL | 5 refills | Status: DC
  Filled 2024-03-25: qty 30, 30d supply, fill #0
  Filled 2024-04-22: qty 30, 30d supply, fill #1
  Filled 2024-05-24: qty 30, 30d supply, fill #2
  Filled 2024-06-25: qty 30, 30d supply, fill #3

## 2024-03-25 NOTE — Progress Notes (Signed)
 Hematology/Oncology Consult note [facility]  [phone] [phone]  Patient Care Team: Calista Catching, FNP as PCP - General (Family Medicine) Constancia Delton, MD as PCP - Cardiology (Cardiology) Burnie Cartwright, RN as Oncology Nurse Navigator Avonne Boettcher, MD as Consulting Physician (Hematology and Oncology) Queenie Brunet, RN as Registered Nurse (Oncology) Borders, Carlene Che, NP as Nurse Practitioner (Hospice and Palliative Medicine) Vergia Glasgow, MD as Consulting Physician (Pulmonary Disease)   Name of the patient: Tricia Berry  [id number]  [birth year]   Date of visit: 03/25/24  Diagnosis- invasive mammary carcinoma of the left breast stage III ypT2 ypN2 cM0 ER/PR positive and HER2 positive   Chief complaint/ Reason for visit-routine follow-up of breast cancer  Heme/Onc history:  patient is a 53 year old female who underwent a diagnostic bilateral mammogram on 09/08/2020 which showed hypoechoic interconnected masses in the left breast from 10:00 to 2 o'clock position spanning at least an area of 4.8 cm.  Ultrasound also showed 5 abnormal lymph nodes in the axilla with cortical thickening.  Both the mass and the lymph nodes were biopsied and was consistent with invasive mammary carcinoma grade 2.  Tumor was ER +91- 100%, PR +11 to 20% and HER-2 negative.  Ki-67 15%.    PET CT scan showed hypermetabolism in the area of the left breast but no evidence of hypermetabolism in the left axilla Or evidence of distant metastatic disease.   Neoadjuvant dose dense ACT chemotherapy started on 10/08/2020. Interim ultrasound after 4 cycles of dose dense AC chemotherapy showed overall decrease in volume of the tumor.  Nodularity previously seen on ultrasound was also decreased in size.   Patient completed neoadjuvant AC Taxol  chemotherapy and underwent bilateral mastectomy with reconstruction.Final pathology showed fibrocystic  changes in the right breast with no malignancy.  3.6 cm invasive mammary carcinoma in the left breast.  7 out of 16 lymph nodes positive for malignancy.  Treatment effect in the breast present but not robust.  Treatment effect in the lymph nodes minimal.  Margins negative.  Overall grade 2.  Extranodal extension present.  ER greater than 90% positive, PR 15% positive.  HER2 +2 equivocal and positive by FISH on the primary breast specimen.  HER2 testing was however negative in the lymph nodes.  Patient completed 1 year of adjuvant Herceptin  and Perjeta  in August 2023.  Patient could not tolerate adjuvant Verzenio  and stopped taking it. She is currently on Arimidex  alone.  She was previously on Arimidex  plus ovarian suppression which she started in June 2022.   patient sees Dr. 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    Interval history-she still has fatigue and exertional shortness of breath.  She is trying to quit smoking altogether.  She is compliant with Arimidex  although not consistent with calcium  and vitamin D  intake.  Neuropathy in her feet is overall stable  ECOG PS- 1 Pain scale- 3   Review of systems- Review of Systems  Constitutional:  Positive for malaise/fatigue. Negative for chills, fever and weight loss.  HENT:  Negative for congestion, ear discharge and nosebleeds.   Eyes:  Negative for blurred vision.  Respiratory:  Positive for shortness of breath. Negative for cough, hemoptysis, sputum production and wheezing.   Cardiovascular:  Negative for chest pain, palpitations, orthopnea and claudication.  Gastrointestinal:  Negative for abdominal pain, blood in stool, constipation, diarrhea, heartburn, melena, nausea  and vomiting.  Genitourinary:  Negative for dysuria, flank pain, frequency, hematuria and urgency.  Musculoskeletal:  Negative for back pain, joint pain and myalgias.  Skin:   Negative for rash.  Neurological:  Negative for dizziness, tingling, focal weakness, seizures, weakness and headaches.  Endo/Heme/Allergies:  Does not bruise/bleed easily.  Psychiatric/Behavioral:  Negative for depression and suicidal ideas. The patient does not have insomnia.       Allergies  Allergen Reactions   Morphine Nausea And Vomiting and Other (See Comments)    migranes Other reaction(s): Headache Migraine, vomiting   Sertraline Hcl     REACTION: Worsened symptoms of IBS   Sulfa Antibiotics Rash   Sulfamethoxazole Rash   Sulfonamide Derivatives Rash     Past Medical History:  Diagnosis Date   Anemia    Anginal pain (HCC)    excertion not cardiac related per patient   Anxiety    Breast cancer (HCC) 12/2021   left breast IMC   Diverticulitis    Dyspnea    Dysrhythmia    prolong QT   Family history of ovarian cancer    GERD (gastroesophageal reflux disease)    Headache    IBS (irritable bowel syndrome)    Neuromuscular disorder (HCC)    neuropathy from chemo   Pneumonia    PONV (postoperative nausea and vomiting)    severe migraine and vomiting post anesthesia   Scoliosis      Past Surgical History:  Procedure Laterality Date   ABDOMINAL HYSTERECTOMY     still has ovaries, no gyn cancer, hysterectomy due to endometriosis. NO cervix on exam 01/10/21   BACK SURGERY     BREAST BIOPSY Left 09/15/2020   us  bx of mass, path pending, Q marker   BREAST BIOPSY Left 09/15/2020   us  bx of LN, hydromarker, path pending   BREAST RECONSTRUCTION WITH PLACEMENT OF TISSUE EXPANDER AND FLEX HD (ACELLULAR HYDRATED DERMIS) Bilateral 03/17/2021   Procedure: IMMEDIATE BILATERAL BREAST RECONSTRUCTION WITH PLACEMENT OF TISSUE EXPANDER AND FLEX HD (ACELLULAR HYDRATED DERMIS);  Surgeon: Thornell Flirt, DO;  Location: [facility];  Service: Plastics;  Laterality: Bilateral;   CAPSULECTOMY Right 10/18/2022   Procedure: Right breast capsule release with  adjustment;  Surgeon: Thornell Flirt, DO;  Location: [facility];  Service: Plastics;  Laterality: Right;   COLONOSCOPY WITH PROPOFOL  N/A 11/21/2021   Procedure: COLONOSCOPY WITH PROPOFOL ;  Surgeon: Selena Daily, MD;  Location: [facility];  Service: Gastroenterology;  Laterality: N/A;   FLEXIBLE BRONCHOSCOPY Bilateral 12/01/2022   Procedure: FLEXIBLE BRONCHOSCOPY;  Surgeon: Vergia Glasgow, MD;  Location: [facility];  Service: Pulmonary;  Laterality: Bilateral;   HAND SURGERY Right 10/2023   IR IMAGING GUIDED PORT INSERTION  10/01/2020   MODIFIED MASTECTOMY Left 03/17/2021   Procedure: LEFT MODIFIED RADICAL MASTECTOMY;  Surgeon: Sim Dryer, MD;  Location: [facility];  Service: General;  Laterality: Left;   PORTA CATH REMOVAL Right 03/17/2021   Procedure: PORTA CATH REMOVAL;  Surgeon: Sim Dryer, MD;  Location: [facility];  Service: General;  Laterality: Right;   REMOVAL OF BILATERAL TISSUE EXPANDERS WITH PLACEMENT OF BILATERAL BREAST IMPLANTS Bilateral 05/09/2021   Procedure: REMOVAL OF BILATERAL TISSUE EXPANDERS WITH PLACEMENT OF BILATERAL BREAST IMPLANTS;  Surgeon: Thornell Flirt, DO;  Location: [facility];  Service: Plastics;  Laterality: Bilateral;  90 min   saginal band repair and capsule release  10/2023   TOTAL MASTECTOMY Right 03/17/2021   Procedure: TOTAL  MASTECTOMY;  Surgeon: Sim Dryer, MD;  Location: [facility];  Service: General;  Laterality: Right;    Social History   Socioeconomic History   Marital status: Significant Other    Spouse name: Not on file   Number of children: 2   Years of education: Not on file   Highest education level: Not on file  Occupational History   Occupation: Nurse    Employer: [employer]  Tobacco Use   Smoking status: Every Day    Current packs/day: 1.00    Average packs/day: 1 pack/day for 20.0 years (20.0 ttl pk-yrs)    Types:  Cigarettes   Smokeless tobacco: Never   Tobacco comments:    1-2 cigarettes weekly- 09/12/2023  Vaping Use   Vaping status: Never Used  Substance and Sexual Activity   Alcohol use: Not Currently   Drug use: No   Sexual activity: Not Currently    Birth control/protection: Surgical    Comment: hyst  Other Topics Concern   Not on file  Social History Narrative   Patient works as an Insurance underwriter at [employer]. She has 2 children at home who have special needs. She and her spouse are primary caregivers.    Social Drivers of Corporate investment banker Strain: Not on file  Food Insecurity: No Food Insecurity (03/21/2023)   Hunger Vital Sign    Worried About Running Out of Food in the Last Year: Never true    Ran Out of Food in the Last Year: Never true  Transportation Needs: No Transportation Needs (03/21/2023)   PRAPARE - Administrator, Civil Service (Medical): No    Lack of Transportation (Non-Medical): No  Physical Activity: Not on file  Stress: Not on file  Social Connections: Not on file  Intimate Partner Violence: Not At Risk (03/21/2023)   Humiliation, Afraid, Rape, and Kick questionnaire    Fear of Current or Ex-Partner: No    Emotionally Abused: No    Physically Abused: No    Sexually Abused: No    Family History  Problem Relation Age of Onset   Hypertension Mother    Osteoarthritis Mother    Diverticulitis Mother    Heart failure Father    Hypertension Father    Gout Father    Heart attack Father 71   Diverticulitis Brother    Ovarian cancer Paternal Grandmother      Current Outpatient Medications:    terbinafine (LAMISIL) 1 % cream, Apply 1 Application topically 2 (two) times daily., Disp: 30 g, Rfl: 0   acetaminophen  (TYLENOL ) 650 MG CR tablet, Take 1,300 mg by mouth every 8 (eight) hours as needed for pain., Disp: , Rfl:    albuterol  (PROVENTIL ) (2.5 MG/3ML) 0.083% nebulizer solution, Take 3 mLs (2.5 mg total) by nebulization every 6 (six) hours as  needed for wheezing or shortness of breath., Disp: 360 mL, Rfl: 12   albuterol  (VENTOLIN  HFA) 108 (90 Base) MCG/ACT inhaler, Inhale 2 puffs into the lungs every 6 (six) hours as needed for wheezing or shortness of breath., Disp: 6.7 g, Rfl: 0   anastrozole  (ARIMIDEX ) 1 MG tablet, Take 1 tablet (1 mg total) by mouth daily., Disp: 30 tablet, Rfl: 5   b complex vitamins capsule, Take 1 capsule by mouth daily., Disp: , Rfl:    baclofen  (LIORESAL ) 10 MG tablet, Take 1 tablet (10 mg total) by mouth 3 (three) times daily as needed for muscle spasms., Disp: 60 tablet, Rfl: 2  calcium -vitamin D  (OSCAL WITH D) 500-5 MG-MCG tablet, Take 1 tablet by mouth daily with breakfast., Disp: , Rfl:    cyanocobalamin  (VITAMIN B12) 1000 MCG/ML injection, Inject 1 mL (1,000 mcg total) into the muscle every 30 (thirty) days., Disp: 3 mL, Rfl: 4   docusate sodium  (COLACE) 100 MG capsule, Take 100 mg by mouth daily as needed for mild constipation., Disp: , Rfl:    FLUoxetine  (PROZAC ) 40 MG capsule, Take 1 capsule (40 mg total) by mouth daily., Disp: 90 capsule, Rfl: 3   gentamicin  ointment (GARAMYCIN ) 0.1 %, Apply to nose 3 (three) times daily., Disp: 30 g, Rfl: 12   ibuprofen  (ADVIL ) 400 MG tablet, Take 400 mg by mouth every 6 (six) hours as needed., Disp: , Rfl:    Lactobacillus (PROBIOTIC ACIDOPHILUS PO), Take 1 capsule by mouth daily., Disp: , Rfl:    lidocaine -prilocaine  (EMLA ) cream, Apply topically., Disp: , Rfl:    LORazepam  (ATIVAN ) 0.5 MG tablet, Take 1 tablet (0.5 mg total) by mouth 2 (two) times daily as needed for anxiety., Disp: 60 tablet, Rfl: 2   nicotine  (NICODERM CQ  - DOSED IN MG/24 HR) 7 mg/24hr patch, Place 1 patch (7 mg total) onto the skin daily., Disp: 28 patch, Rfl: 0   nicotine  polacrilex (NICORETTE ) 2 MG gum, Take 1 each (2 mg total) by mouth as needed for smoking cessation., Disp: 110 tablet, Rfl: 0   Oxycodone  HCl 10 MG TABS, Take 1 tablet (10mg ) by mouth once every four hours as needed for pain.  Stop Percocet due to headaches., Disp: 180 tablet, Rfl: 0   Oxycodone  HCl 10 MG TABS, Take 1 tablet (10 mg total) by mouth every 4 (four) hours as needed for pain., Disp: 180 tablet, Rfl: 0   Oxycodone  HCl 10 MG TABS, Take 1 tablet (10 mg total) by mouth every 4 (four) hours as needed for pain, Disp: 18 tablet, Rfl: 0   Oxycodone  HCl 10 MG TABS, Take 1 tablet (10 mg total) by mouth every 4 (four) hours as needed for pain., Disp: 180 tablet, Rfl: 0   Oxycodone  HCl 10 MG TABS, Take 1 tablet (10 mg total) by mouth every 4 (four) hours as needed for pain, Disp: 180 tablet, Rfl: 0   Oxycodone  HCl 10 MG TABS, Take 1 tablet (10 mg total) by mouth every 4 (four) hours as needed for pain., Disp: 180 tablet, Rfl: 0   Oxycodone  HCl 10 MG TABS, Take 1 tablet (10 mg total) by mouth every 4 (four) hours as needed for pain., Disp: 180 tablet, Rfl: 0   Oxycodone  HCl 10 MG TABS, Take 1 tablet (10 mg total) by mouth every 4 (four) hours as needed., Disp: 180 tablet, Rfl: 0   Oxycodone  HCl 10 MG TABS, Take 1 tablet (10 mg total) by mouth every 4 (four) hours as needed for pain., Disp: 90 tablet, Rfl: 0   Oxycodone  HCl 10 MG TABS, Take 1 tablet (10 mg total) by mouth every 4 (four) hours as needed for pain., Disp: 180 tablet, Rfl: 0   [START ON 04/10/2024] Oxycodone  HCl 10 MG TABS, Take 1 tablet (10 mg total) by mouth every 4 (four) hours as needed for pain., Disp: 90 tablet, Rfl: 0   pravastatin  (PRAVACHOL ) 10 MG tablet, Take 1 tablet (10 mg total) by mouth daily., Disp: 90 tablet, Rfl: 3   pregabalin  (LYRICA ) 150 MG capsule, Take 1 capsule (150 mg total) by mouth in the morning, at noon, and at bedtime., Disp: 90 capsule,  Rfl: 2   trimethoprim  (TRIMPEX ) 100 MG tablet, Take 1 tablet (100 mg total) by mouth daily., Disp: 90 tablet, Rfl: 3   umeclidinium-vilanterol (ANORO ELLIPTA ) 62.5-25 MCG/ACT AEPB, Inhale 1 puff into the lungs daily., Disp: 60 each, Rfl: 6   vitamin B-12 (CYANOCOBALAMIN ) 100 MCG tablet, Take 100 mcg by  mouth daily. gummy, Disp: , Rfl:  No current facility-administered medications for this visit.  Facility-Administered Medications Ordered in Other Visits:    acetaminophen  (TYLENOL ) 325 MG tablet, , , ,    diphenhydrAMINE  (BENADRYL ) 25 mg capsule, , , ,    goserelin (ZOLADEX ) injection 3.6 mg, 3.6 mg, Subcutaneous, Q28 days, Avonne Boettcher, MD, 3.6 mg at 02/02/23 1206   heparin  lock flush 100 unit/mL, 500 Units, Intravenous, Once, Avonne Boettcher, MD   Zoledronic  Acid (ZOMETA ) 4 MG/100ML IVPB, , , ,   Physical exam:  Vitals:   03/25/24 1123  BP: 101/70  Pulse: 70  Resp: 19  Temp: 98 F (36.7 C)  TempSrc: Tympanic  SpO2: 98%  Weight: 113 lb 14.4 oz (51.7 kg)  Height: 5' (1.524 m)   Physical Exam Cardiovascular:     Rate and Rhythm: Normal rate and regular rhythm.     Heart sounds: Normal heart sounds.  Pulmonary:     Effort: Pulmonary effort is normal.     Breath sounds: Normal breath sounds.  Abdominal:     General: Bowel sounds are normal.     Palpations: Abdomen is soft.  Skin:    General: Skin is warm and dry.  Neurological:     Mental Status: She is alert and oriented to person, place, and time.    Breast exam is performed in seated and lying down position. Patient is status post bilateral mastectomy with reconstruction. The implant edges are intact and there is no evidence of any chest wall recurrence. No evidence of bilateral axillary adenopathy    I have personally reviewed labs listed below:    Latest Ref Rng & Units 06/01/2023   10:51 AM  CMP  Glucose 70 - 99 mg/dL 97   BUN 6 - 20 mg/dL 9   Creatinine 1.61 - 0.96 mg/dL 0.45   Sodium 409 - 811 mmol/L 138   Potassium 3.5 - 5.1 mmol/L 3.7   Chloride 98 - 111 mmol/L 107   CO2 22 - 32 mmol/L 22   Calcium  8.9 - 10.3 mg/dL 9.2   Total Protein 6.5 - 8.1 g/dL 7.1   Total Bilirubin 0.3 - 1.2 mg/dL 0.5   Alkaline Phos 38 - 126 U/L 57   AST 15 - 41 U/L 21   ALT 0 - 44 U/L 19       Latest Ref Rng & Units 06/01/2023    10:51 AM  CBC  WBC 4.0 - 10.5 K/uL 9.2   Hemoglobin 12.0 - 15.0 g/dL 91.4   Hematocrit 78.2 - 46.0 % 46.0   Platelets 150 - 400 K/uL 216      Assessment and plan- Patient is a 53 y.o. female  with history of stage III left breast cancer ER/PR positive and mixed pathology of HER2 positive and HER2 negative.  She is here for a routine follow-up of breast cancer currently on Arimidex   Clinically patient is doing well with no concerning signs and symptoms of recurrence based on today's exam.  She is status post bilateral mastectomy with reconstruction and therefore does not require any continued mammograms.  She will continue to stay on Arimidex  calcium   and vitamin D  at this time.  She is overdue for a bone density scan which we will schedule.  I will see her back in 6 months no labs.  Patient has completed adjuvant Zometa  as well.  She has received Zoladex  in the past for hormone suppression and her hormone levels have been consistently postmenopausal even after stopping Zoladex .  Levels again from today are pending.  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   Visit Diagnosis 1. Encounter for follow-up surveillance of breast cancer   2. Visit for monitoring Arimidex  therapy   3. High risk medication use   4. Chemotherapy-induced peripheral neuropathy (HCC)      Dr. Seretha Dance, MD, MPH [facility] [phone] 03/25/2024 1:05 PM

## 2024-03-26 LAB — ESTRADIOL: Estradiol: <5 pg/mL

## 2024-03-26 LAB — FSH/LH
FSH: 13.9 m[IU]/mL
LH: 0.5 m[IU]/mL

## 2024-03-31 ENCOUNTER — Other Ambulatory Visit: Payer: Self-pay

## 2024-04-09 ENCOUNTER — Other Ambulatory Visit: Payer: Self-pay

## 2024-04-09 MED ORDER — OXYCODONE HCL 10 MG PO TABS
10.0000 mg | ORAL_TABLET | ORAL | 0 refills | Status: DC | PRN
  Filled 2024-04-10 – 2024-04-23 (×4): qty 180, 30d supply, fill #0

## 2024-04-10 ENCOUNTER — Other Ambulatory Visit: Payer: Self-pay

## 2024-04-11 ENCOUNTER — Other Ambulatory Visit: Payer: Self-pay

## 2024-04-22 ENCOUNTER — Other Ambulatory Visit: Payer: Self-pay

## 2024-04-23 ENCOUNTER — Other Ambulatory Visit: Payer: Self-pay

## 2024-04-29 ENCOUNTER — Other Ambulatory Visit: Payer: Self-pay

## 2024-04-30 ENCOUNTER — Other Ambulatory Visit: Payer: Self-pay

## 2024-05-01 ENCOUNTER — Other Ambulatory Visit: Payer: Self-pay

## 2024-05-05 ENCOUNTER — Other Ambulatory Visit: Payer: Self-pay

## 2024-05-05 MED ORDER — OXYCODONE HCL 10 MG PO TABS
10.0000 mg | ORAL_TABLET | ORAL | 0 refills | Status: DC | PRN
  Filled 2024-05-22: qty 180, 30d supply, fill #0

## 2024-05-05 MED ORDER — OXYCODONE HCL 10 MG PO TABS
10.0000 mg | ORAL_TABLET | ORAL | 0 refills | Status: DC | PRN
  Filled 2024-06-20 (×2): qty 180, 30d supply, fill #0

## 2024-05-06 ENCOUNTER — Other Ambulatory Visit: Payer: Self-pay

## 2024-05-19 ENCOUNTER — Ambulatory Visit
Admission: RE | Admit: 2024-05-19 | Discharge: 2024-05-19 | Disposition: A | Discharge status: Home / Self Care | Payer: Commercial Managed Care - HMO | Source: Ambulatory Visit | Attending: Internal Medicine | Admitting: Internal Medicine

## 2024-05-19 ENCOUNTER — Ambulatory Visit
Admission: RE | Admit: 2024-05-19 | Discharge: 2024-05-19 | Disposition: A | Discharge status: Home / Self Care | Payer: Commercial Managed Care - HMO | Source: Ambulatory Visit | Attending: Internal Medicine

## 2024-05-19 DIAGNOSIS — C50911 Malignant neoplasm of unspecified site of right female breast: Secondary | ICD-10-CM | POA: Insufficient documentation

## 2024-05-19 DIAGNOSIS — C7931 Secondary malignant neoplasm of brain: Secondary | ICD-10-CM | POA: Diagnosis present

## 2024-05-19 DIAGNOSIS — I671 Cerebral aneurysm, nonruptured: Secondary | ICD-10-CM | POA: Insufficient documentation

## 2024-05-19 MED ORDER — GADOBUTROL 1 MMOL/ML IV SOLN
5.0000 mL | Freq: Once | INTRAVENOUS | Status: AC | PRN
  Administered 2024-05-19: 5 mL via INTRAVENOUS

## 2024-05-21 ENCOUNTER — Other Ambulatory Visit: Payer: Self-pay

## 2024-05-21 ENCOUNTER — Encounter: Payer: Self-pay | Admitting: Family

## 2024-05-21 ENCOUNTER — Ambulatory Visit: Admitting: Family

## 2024-05-21 VITALS — BP 100/76 | HR 79 | Temp 98.1°F | Ht 60.0 in | Wt 107.8 lb

## 2024-05-21 DIAGNOSIS — E785 Hyperlipidemia, unspecified: Secondary | ICD-10-CM

## 2024-05-21 DIAGNOSIS — E538 Deficiency of other specified B group vitamins: Secondary | ICD-10-CM

## 2024-05-21 DIAGNOSIS — Z853 Personal history of malignant neoplasm of breast: Secondary | ICD-10-CM

## 2024-05-21 DIAGNOSIS — B351 Tinea unguium: Secondary | ICD-10-CM

## 2024-05-21 DIAGNOSIS — J849 Interstitial pulmonary disease, unspecified: Secondary | ICD-10-CM

## 2024-05-21 LAB — COMPREHENSIVE METABOLIC PANEL WITH GFR
ALT: 80 U/L — ABNORMAL HIGH (ref 0–35)
AST: 134 U/L — ABNORMAL HIGH (ref 0–37)
Albumin: 4 g/dL (ref 3.5–5.2)
Alkaline Phosphatase: 294 U/L — ABNORMAL HIGH (ref 39–117)
BUN: 11 mg/dL (ref 6–23)
CO2: 28 meq/L (ref 19–32)
Calcium: 9.6 mg/dL (ref 8.4–10.5)
Chloride: 104 meq/L (ref 96–112)
Creatinine, Ser: 0.98 mg/dL (ref 0.40–1.20)
GFR: 65.65 mL/min (ref >60.00)
Glucose, Bld: 80 mg/dL (ref 70–99)
Potassium: 4.6 meq/L (ref 3.5–5.1)
Sodium: 141 meq/L (ref 135–145)
Total Bilirubin: 0.5 mg/dL (ref 0.2–1.2)
Total Protein: 6.4 g/dL (ref 6.0–8.3)

## 2024-05-21 LAB — CBC WITH DIFFERENTIAL/PLATELET
Basophils Absolute: 0.1 K/uL (ref 0.0–0.1)
Basophils Relative: 0.8 % (ref 0.0–3.0)
Eosinophils Absolute: 0.1 K/uL (ref 0.0–0.7)
Eosinophils Relative: 0.6 % (ref 0.0–5.0)
HCT: 45.3 % (ref 36.0–46.0)
Hemoglobin: 14.6 g/dL (ref 12.0–15.0)
Lymphocytes Relative: 37.6 % (ref 12.0–46.0)
Lymphs Abs: 3.7 K/uL (ref 0.7–4.0)
MCHC: 32.3 g/dL (ref 30.0–36.0)
MCV: 86.5 fl (ref 78.0–100.0)
Monocytes Absolute: 0.7 K/uL (ref 0.1–1.0)
Monocytes Relative: 6.9 % (ref 3.0–12.0)
Neutro Abs: 5.3 K/uL (ref 1.4–7.7)
Neutrophils Relative %: 54.1 % (ref 43.0–77.0)
Platelets: 149 K/uL — ABNORMAL LOW (ref 150.0–400.0)
RBC: 5.23 Mil/uL — ABNORMAL HIGH (ref 3.87–5.11)
RDW: 17.6 % — ABNORMAL HIGH (ref 11.5–15.5)
WBC: 9.8 K/uL (ref 4.0–10.5)

## 2024-05-21 LAB — LIPID PANEL
Cholesterol: 135 mg/dL (ref 0–200)
HDL: 44.5 mg/dL (ref >39.00)
LDL Cholesterol: 59 mg/dL (ref 0–99)
NonHDL: 90.52
Total CHOL/HDL Ratio: 3
Triglycerides: 159 mg/dL — ABNORMAL HIGH (ref 0.0–149.0)
VLDL: 31.8 mg/dL (ref 0.0–40.0)

## 2024-05-21 LAB — B12 AND FOLATE PANEL
Folate: 5.7 ng/mL — ABNORMAL LOW (ref >5.9)
Vitamin B-12: 348 pg/mL (ref 211–911)

## 2024-05-21 LAB — VITAMIN D 25 HYDROXY (VIT D DEFICIENCY, FRACTURES): VITD: 29.77 ng/mL — ABNORMAL LOW (ref 30.00–100.00)

## 2024-05-21 LAB — TSH: TSH: 4.07 u[IU]/mL (ref 0.35–5.50)

## 2024-05-21 MED ORDER — EFINACONAZOLE 10 % EX SOLN
CUTANEOUS | 1 refills | Status: DC
  Filled 2024-05-21 (×2): qty 8, 30d supply, fill #0

## 2024-05-21 NOTE — Assessment & Plan Note (Signed)
 Chronic, symptomatically improved. She declines respiratory therapy at this time. Will follow.

## 2024-05-21 NOTE — Assessment & Plan Note (Addendum)
 Presentation c/w onychomycosis , mild case < with less 50% of nail bed involvement. We discussed hepatotoxicity risk with oral antifungal is not worth mild disease. She prefers to trial topical. Trial of efinaconazole  10% soluntion to BL great toes.

## 2024-05-21 NOTE — Progress Notes (Signed)
 Assessment & Plan:  B12 deficiency -     TSH -     B12 and Folate Panel  Hyperlipidemia, unspecified hyperlipidemia type -     Lipid panel  History of breast cancer (Left) -     CBC with Differential/Platelet -     Comprehensive metabolic panel with GFR -     VITAMIN D  25 Hydroxy (Vit-D Deficiency, Fractures)  Onychomycosis Assessment & Plan: Presentation c/w onychomycosis , mild case < with less 50% of nail bed involvement. We discussed hepatotoxicity risk with oral antifungal is not worth mild disease. She prefers to trial topical. Trial of efinaconazole  10% soluntion to BL great toes.   Orders: -     Efinaconazole ; Apply to affected toenail(s) once daily for 48 weeks; ensure complete coverage of the toenail, the toenail folds, toenail bed, surrounding skin, and the undersurface of the toenail plate (  Dispense: 8 mL; Refill: 1  ILD (interstitial lung disease) (HCC) Assessment & Plan: Chronic, symptomatically improved. She declines respiratory therapy at this time. Will follow.       Return precautions given.   Risks, benefits, and alternatives of the medications and treatment plan prescribed today were discussed, and patient expressed understanding.   Education regarding symptom management and diagnosis given to patient on AVS either electronically or printed.  No follow-ups on file.  Rollene Northern, FNP  Subjective:    Patient ID: Tricia Berry, female    DOB: [birth year], 53 y.o.   MRN: [id number]  CC: Tricia Berry is a 53 y.o. female who presents today for follow up.   HPI: She is breathing better.   She is doing housework easier  She is concerned with fungal infection of toes.   Compliant with pravastatin  10mg     Compliant with IM b12 monthly at home.   She has lost weight , unintentionally.  She doesn't eat breakfast. Light snack for lunch. Son cooks dinner which may include parm chicken with one side.    Goal weight  110-115lbs  Denies abdominal pain, changes in bowel habits, night sweats, chills, fever.   Colonoscopy is UTD Compliant with prozac  40 mg,Ativan  0.5 mg twice daily prn  Pulmonary rehab reached out to patient 04/15/24   She is scheduled to follow up with Dr Buckley after MRA head nad MRI brain  Follow up with Dr Melanee 03/25/24 She is status post bilateral mastectomy with reconstruction and therefore does not require any continued mammograms.  Continued on arimidex , calcium  and vit D Allergies: Morphine, Sertraline hcl, Sulfa antibiotics, Sulfamethoxazole, and Sulfonamide derivatives Current Outpatient Medications on File Prior to Visit  Medication Sig Dispense Refill   acetaminophen  (TYLENOL ) 650 MG CR tablet Take 1,300 mg by mouth every 8 (eight) hours as needed for pain.     albuterol  (PROVENTIL ) (2.5 MG/3ML) 0.083% nebulizer solution Take 3 mLs (2.5 mg total) by nebulization every 6 (six) hours as needed for wheezing or shortness of breath. 360 mL 12   albuterol  (VENTOLIN  HFA) 108 (90 Base) MCG/ACT inhaler Inhale 2 puffs into the lungs every 6 (six) hours as needed for wheezing or shortness of breath. 6.7 g 0   anastrozole  (ARIMIDEX ) 1 MG tablet Take 1 tablet (1 mg total) by mouth daily. 30 tablet 5   b complex vitamins capsule Take 1 capsule by mouth daily.     baclofen  (LIORESAL ) 10 MG tablet Take 1 tablet (10 mg total) by mouth 3 (three) times daily as needed for muscle spasms. 60 tablet 2  calcium -vitamin D  (OSCAL WITH D) 500-5 MG-MCG tablet Take 1 tablet by mouth daily with breakfast.     docusate sodium  (COLACE) 100 MG capsule Take 100 mg by mouth daily as needed for mild constipation.     FLUoxetine  (PROZAC ) 40 MG capsule Take 1 capsule (40 mg total) by mouth daily. 90 capsule 3   gentamicin  ointment (GARAMYCIN ) 0.1 % Apply to nose 3 (three) times daily. 30 g 12   ibuprofen  (ADVIL ) 400 MG tablet Take 400 mg by mouth every 6 (six) hours as needed.     Lactobacillus (PROBIOTIC  ACIDOPHILUS PO) Take 1 capsule by mouth daily.     lidocaine -prilocaine  (EMLA ) cream Apply topically.     LORazepam  (ATIVAN ) 0.5 MG tablet Take 1 tablet (0.5 mg total) by mouth 2 (two) times daily as needed for anxiety. 60 tablet 2   nicotine  polacrilex (NICORETTE ) 2 MG gum Take 1 each (2 mg total) by mouth as needed for smoking cessation. 110 tablet 0   Oxycodone  HCl 10 MG TABS Take 1 tablet (10mg ) by mouth once every four hours as needed for pain. Stop Percocet due to headaches. 180 tablet 0   Oxycodone  HCl 10 MG TABS Take 1 tablet (10 mg total) by mouth every 4 (four) hours as needed for pain. 180 tablet 0   Oxycodone  HCl 10 MG TABS Take 1 tablet (10 mg total) by mouth every 4 (four) hours as needed for pain 18 tablet 0   Oxycodone  HCl 10 MG TABS Take 1 tablet (10 mg total) by mouth every 4 (four) hours as needed for pain. 180 tablet 0   Oxycodone  HCl 10 MG TABS Take 1 tablet (10 mg total) by mouth every 4 (four) hours as needed for pain 180 tablet 0   Oxycodone  HCl 10 MG TABS Take 1 tablet (10 mg total) by mouth every 4 (four) hours as needed for pain. 180 tablet 0   Oxycodone  HCl 10 MG TABS Take 1 tablet (10 mg total) by mouth every 4 (four) hours as needed for pain. 180 tablet 0   Oxycodone  HCl 10 MG TABS Take 1 tablet (10 mg total) by mouth every 4 (four) hours as needed. 180 tablet 0   Oxycodone  HCl 10 MG TABS Take 1 tablet (10 mg total) by mouth every 4 (four) hours as needed for pain. 90 tablet 0   Oxycodone  HCl 10 MG TABS Take 1 tablet (10 mg total) by mouth every 4 (four) hours as needed for pain. 180 tablet 0   Oxycodone  HCl 10 MG TABS Take 1 tablet (10 mg total) by mouth every 4 (four) hours as needed for pain. 90 tablet 0   Oxycodone  HCl 10 MG TABS Take 1 tablet (10 mg total) by mouth every 4 (four) hours as needed for pain. 180 tablet 0   [START ON 06/20/2024] Oxycodone  HCl 10 MG TABS Take 1 tablet (10 mg total) by mouth every 4 (four) hours as needed for pain. 180 tablet 0    Oxycodone  HCl 10 MG TABS Take 1 tablet (10 mg total) by mouth every 4 (four) hours as needed for pain. 180 tablet 0   pravastatin  (PRAVACHOL ) 10 MG tablet Take 1 tablet (10 mg total) by mouth daily. 90 tablet 3   pregabalin  (LYRICA ) 150 MG capsule Take 1 capsule (150 mg total) by mouth in the morning, at noon, and at bedtime. 90 capsule 2   terbinafine  (LAMISIL ) 1 % cream Apply 1 Application topically 2 (two) times daily. 30 g  0   trimethoprim  (TRIMPEX ) 100 MG tablet Take 1 tablet (100 mg total) by mouth daily. 90 tablet 3   umeclidinium-vilanterol (ANORO ELLIPTA ) 62.5-25 MCG/ACT AEPB Inhale 1 puff into the lungs daily. 60 each 6   vitamin B-12 (CYANOCOBALAMIN ) 100 MCG tablet Take 100 mcg by mouth daily. gummy     Current Facility-Administered Medications on File Prior to Visit  Medication Dose Route Frequency Provider Last Rate Last Admin   acetaminophen  (TYLENOL ) 325 MG tablet            diphenhydrAMINE  (BENADRYL ) 25 mg capsule            goserelin (ZOLADEX ) injection 3.6 mg  3.6 mg Subcutaneous Q28 days Rao, Archana C, MD   3.6 mg at 02/02/23 1206   heparin  lock flush 100 unit/mL  500 Units Intravenous Once Rao, Archana C, MD       Zoledronic  Acid (ZOMETA ) 4 MG/100ML IVPB             Review of Systems  Constitutional:  Negative for chills and fever.  Respiratory:  Positive for shortness of breath (improved). Negative for cough.   Cardiovascular:  Negative for chest pain and palpitations.  Gastrointestinal:  Negative for nausea and vomiting.      Objective:    BP 100/76   Pulse 79   Temp 98.1 F (36.7 C) (Oral)   Ht 5' (1.524 m)   Wt 107 lb 12.8 oz (48.9 kg)   SpO2 94%   BMI 21.05 kg/m  BP Readings from Last 3 Encounters:  05/21/24 100/76  03/25/24 101/70  02/29/24 101/64   Wt Readings from Last 3 Encounters:  05/21/24 107 lb 12.8 oz (48.9 kg)  03/25/24 113 lb 14.4 oz (51.7 kg)  02/29/24 118 lb 6.4 oz (53.7 kg)    Physical Exam Vitals reviewed.  Constitutional:       Appearance: She is well-developed.  Eyes:     Conjunctiva/sclera: Conjunctivae normal.  Cardiovascular:     Rate and Rhythm: Normal rate and regular rhythm.     Pulses: Normal pulses.     Heart sounds: Normal heart sounds.  Pulmonary:     Effort: Pulmonary effort is normal.     Breath sounds: Normal breath sounds. No wheezing, rhonchi or rales.  Skin:    General: Skin is warm and dry.  Neurological:     Mental Status: She is alert.  Psychiatric:        Speech: Speech normal.        Behavior: Behavior normal.        Thought Content: Thought content normal.

## 2024-05-21 NOTE — Patient Instructions (Addendum)
  Please download Myfitness Pal App ( basic version is free).   You may log every thing you eat for even 2-3 days to get a better of idea of total daily calories.   I am concerned you are not eating enough. Focus on healthy high caloric foods.   Please let me know if you continue to loose weight as I would recommend prompt imaging.  Consider seeing nutrition again.   Trial of topical anti fungal for nails.

## 2024-05-22 ENCOUNTER — Other Ambulatory Visit: Payer: Self-pay

## 2024-05-24 ENCOUNTER — Other Ambulatory Visit: Payer: Self-pay | Admitting: Family

## 2024-05-24 DIAGNOSIS — Z720 Tobacco use: Secondary | ICD-10-CM

## 2024-05-26 ENCOUNTER — Other Ambulatory Visit: Payer: Self-pay

## 2024-05-26 ENCOUNTER — Other Ambulatory Visit: Payer: Self-pay | Admitting: Family

## 2024-05-26 ENCOUNTER — Ambulatory Visit: Payer: Self-pay | Admitting: Family

## 2024-05-26 DIAGNOSIS — Z720 Tobacco use: Secondary | ICD-10-CM

## 2024-05-26 DIAGNOSIS — R899 Unspecified abnormal finding in specimens from other organs, systems and tissues: Secondary | ICD-10-CM

## 2024-05-26 MED FILL — Nicotine TD Patch 24HR 7 MG/24HR: TRANSDERMAL | 28 days supply | Qty: 28 | Fill #0 | Status: AC

## 2024-05-29 ENCOUNTER — Other Ambulatory Visit: Payer: Self-pay | Admitting: Family

## 2024-05-29 ENCOUNTER — Encounter: Payer: Self-pay | Admitting: Family

## 2024-05-29 ENCOUNTER — Other Ambulatory Visit: Payer: Self-pay

## 2024-05-29 DIAGNOSIS — F411 Generalized anxiety disorder: Secondary | ICD-10-CM

## 2024-05-29 DIAGNOSIS — M545 Low back pain, unspecified: Secondary | ICD-10-CM

## 2024-05-29 DIAGNOSIS — B351 Tinea unguium: Secondary | ICD-10-CM

## 2024-05-29 MED ORDER — PREGABALIN 150 MG PO CAPS
150.0000 mg | ORAL_CAPSULE | Freq: Three times a day (TID) | ORAL | 2 refills | Status: DC
  Filled 2024-05-29: qty 90, 30d supply, fill #0
  Filled 2024-06-19 – 2024-06-26 (×2): qty 90, 30d supply, fill #1

## 2024-05-29 MED ORDER — CICLOPIROX 8 % EX SOLN
CUTANEOUS | 0 refills | Status: DC
  Filled 2024-05-29: qty 6.6, 30d supply, fill #0

## 2024-05-29 MED ORDER — LORAZEPAM 0.5 MG PO TABS
0.5000 mg | ORAL_TABLET | Freq: Two times a day (BID) | ORAL | 2 refills | Status: DC | PRN
  Filled 2024-05-29: qty 60, 30d supply, fill #0
  Filled 2024-06-19 – 2024-06-26 (×2): qty 60, 30d supply, fill #1

## 2024-05-29 NOTE — Telephone Encounter (Signed)
 Pt requesting refills for lyrica  and ativan . Medications have been pended

## 2024-05-29 NOTE — Telephone Encounter (Signed)
 Noted

## 2024-05-30 ENCOUNTER — Inpatient Hospital Stay: Payer: Commercial Managed Care - HMO | Attending: Oncology | Admitting: Internal Medicine

## 2024-05-30 VITALS — BP 105/73 | HR 72 | Temp 98.6°F | Resp 19 | Wt 108.9 lb

## 2024-05-30 DIAGNOSIS — Z8041 Family history of malignant neoplasm of ovary: Secondary | ICD-10-CM | POA: Insufficient documentation

## 2024-05-30 DIAGNOSIS — I671 Cerebral aneurysm, nonruptured: Secondary | ICD-10-CM | POA: Diagnosis not present

## 2024-05-30 DIAGNOSIS — C50212 Malignant neoplasm of upper-inner quadrant of left female breast: Secondary | ICD-10-CM | POA: Diagnosis present

## 2024-05-30 DIAGNOSIS — C7931 Secondary malignant neoplasm of brain: Secondary | ICD-10-CM | POA: Insufficient documentation

## 2024-05-30 DIAGNOSIS — G939 Disorder of brain, unspecified: Secondary | ICD-10-CM | POA: Insufficient documentation

## 2024-05-30 DIAGNOSIS — Z170 Estrogen receptor positive status [ER+]: Secondary | ICD-10-CM | POA: Diagnosis not present

## 2024-05-30 DIAGNOSIS — G629 Polyneuropathy, unspecified: Secondary | ICD-10-CM | POA: Insufficient documentation

## 2024-05-30 DIAGNOSIS — F17210 Nicotine dependence, cigarettes, uncomplicated: Secondary | ICD-10-CM | POA: Diagnosis not present

## 2024-05-30 DIAGNOSIS — C50412 Malignant neoplasm of upper-outer quadrant of left female breast: Secondary | ICD-10-CM | POA: Diagnosis not present

## 2024-05-30 NOTE — Progress Notes (Signed)
 [facility] [address]  [place], [place] [address] [phone]   Interval Evaluation  Date of Service: 05/30/24 Patient Name: Tricia Berry Patient MRN: [id number] Patient DOB: 05-02-1971 Provider: Arthea MARLA Manns, MD  Identifying Statement:  Tricia Berry is a 53 y.o. female with Brain aneurysm   Primary Cancer:  Oncologic History: Oncology History  Malignant neoplasm of left female breast (HCC)  09/26/2020 Initial Diagnosis   Malignant neoplasm of upper-outer quadrant of left breast in female, estrogen receptor positive (HCC)   10/08/2020 - 02/18/2021 Chemotherapy         10/08/2020 Cancer Staging   Staging form: Breast, AJCC 8th Edition - Clinical stage from 10/08/2020: Stage IIIB (cT4, cN1, cM0, G2, ER+, PR+, HER2-) - Signed by Melanee Annah BROCKS, MD on 10/08/2020    Genetic Testing   Negative genetic testing. No pathogenic variants identified on the [facility] Common Hereditary Cancers Panel. The report date is 11/18/2020.  The Multi-Cancer Panel offered by [facility] includes sequencing and/or deletion duplication testing of the following 84 genes: AIP, ALK, APC, ATM, AXIN2,BAP1,  BARD1, BLM, BMPR1A, BRCA1, BRCA2, BRIP1, CASR, CDC73, CDH1, CDK4, CDKN1B, CDKN1C, CDKN2A (p14ARF), CDKN2A (p16INK4a), CEBPA, CHEK2, CTNNA1, DICER1, DIS3L2, EGFR (c.2369C>T, p.Thr790Met variant only), EPCAM (Deletion/duplication testing only), FH, FLCN, GATA2, GPC3, GREM1 (Promoter region deletion/duplication testing only), HOXB13 (c.251G>A, p.Gly84Glu), HRAS, KIT, MAX, MEN1, MET, MITF (c.952G>A, p.Glu318Lys variant only), MLH1, MSH2, MSH3, MSH6, MUTYH, NBN, NF1, NF2, NTHL1, PALB2, PDGFRA, PHOX2B, PMS2, POLD1, POLE, POT1, PRKAR1A, PTCH1, PTEN, RAD50, RAD51C, RAD51D, RB1, RECQL4, RET,  RUNX1, SDHAF2, SDHA (sequence changes only), SDHB, SDHC, SDHD, SMAD4, SMARCA4, SMARCB1, SMARCE1, STK11, SUFU, TERC, TERT, TMEM127, TP53, TSC1, TSC2, VHL, WRN and WT1.     04/11/2021 Cancer Staging   Staging form: Breast, AJCC 8th Edition - Pathologic stage from 04/11/2021: No Stage Recommended (ypT2, pN2a, cM0, G2, ER+, PR+, HER2+) - Signed by Melanee Annah BROCKS, MD on 04/11/2021 Stage prefix: Post-therapy Histologic grading system: 3 grade system   04/27/2021 - 06/10/2021 Chemotherapy         07/01/2021 - 06/07/2022 Chemotherapy   Patient is on Treatment Plan : BREAST Weekly Paclitaxel  + Trastuzumab  + Pertuzumab  q21d x 8 cycles / Trastuzumab  + Pertuzumab  q21d x 4 cycles     06/28/2022 - 07/19/2022 Chemotherapy   Patient is on Treatment Plan : BREAST Trastuzumab   + Pertuzumab  q21d x 13 cycles       Interval History: Tricia Berry presents today for follow up after recent MRI and MRA.  Neuropathy is stable overall on the lyrica  and oxycodone .  Denies new or progressive changes today.  Continues to describe brain fog, short term memory lapses as prior.  H+P (06/02/22) Patient presents today to review brain MRI, as well as neuropathic symptoms.  She describes >1 year history of tingling, pain, burning, cold sensitivity affecting her feet and hands.  Pain is severe in particular at night, with burning in her left big toe keeping her awake.  In addition she describes more chronic aching pain in her lower back.  She also complains of feelings of imbalance, but no falls and no walking aid.  This does not appear to be getting worse in recent months since taxol  was discontinued.   She otherwise denies focal neurologic deficits, no focal weakness, numbness, double vision, slurred speech.  No seizures.  Medications: Current Outpatient Medications on File Prior to Visit  Medication Sig Dispense Refill   albuterol  (PROVENTIL ) (2.5 MG/3ML) 0.083%  nebulizer solution Take 3 mLs (2.5 mg total) by nebulization every 6 (six) hours as needed for wheezing or shortness of breath. 360 mL 12   albuterol  (VENTOLIN  HFA) 108 (90 Base) MCG/ACT inhaler Inhale 2 puffs into the lungs every 6  (six) hours as needed for wheezing or shortness of breath. 6.7 g 0   anastrozole  (ARIMIDEX ) 1 MG tablet Take 1 tablet (1 mg total) by mouth daily. 30 tablet 5   b complex vitamins capsule Take 1 capsule by mouth daily.     baclofen  (LIORESAL ) 10 MG tablet Take 1 tablet (10 mg total) by mouth 3 (three) times daily as needed for muscle spasms. 60 tablet 2   calcium -vitamin D  (OSCAL WITH D) 500-5 MG-MCG tablet Take 1 tablet by mouth daily with breakfast.     ciclopirox  (PENLAC ) 8 % solution Apply over nail and surrounding skin daily. Apply daily over previous coat. After seven (7) days, remove with alcohol and continue cycle.; continue therapy until nail clearance (max duration: 48 wks) 6.6 mL 0   docusate sodium  (COLACE) 100 MG capsule Take 100 mg by mouth daily as needed for mild constipation.     FLUoxetine  (PROZAC ) 40 MG capsule Take 1 capsule (40 mg total) by mouth daily. 90 capsule 3   gentamicin  ointment (GARAMYCIN ) 0.1 % Apply to nose 3 (three) times daily. 30 g 12   ibuprofen  (ADVIL ) 400 MG tablet Take 400 mg by mouth every 6 (six) hours as needed.     Lactobacillus (PROBIOTIC ACIDOPHILUS PO) Take 1 capsule by mouth daily.     LORazepam  (ATIVAN ) 0.5 MG tablet Take 1 tablet (0.5 mg total) by mouth 2 (two) times daily as needed for anxiety. 60 tablet 2   nicotine  (NICODERM CQ  - DOSED IN MG/24 HR) 7 mg/24hr patch Place 1 patch (7 mg total) onto the skin daily. 28 patch 2   nicotine  polacrilex (NICORETTE ) 2 MG gum Take 1 each (2 mg total) by mouth as needed for smoking cessation. 110 tablet 0   Oxycodone  HCl 10 MG TABS Take 1 tablet (10mg ) by mouth once every four hours as needed for pain. Stop Percocet due to headaches. 180 tablet 0   Oxycodone  HCl 10 MG TABS Take 1 tablet (10 mg total) by mouth every 4 (four) hours as needed for pain. 180 tablet 0   Oxycodone  HCl 10 MG TABS Take 1 tablet (10 mg total) by mouth every 4 (four) hours as needed for pain 18 tablet 0   Oxycodone  HCl 10 MG TABS Take 1  tablet (10 mg total) by mouth every 4 (four) hours as needed for pain. 180 tablet 0   Oxycodone  HCl 10 MG TABS Take 1 tablet (10 mg total) by mouth every 4 (four) hours as needed for pain 180 tablet 0   Oxycodone  HCl 10 MG TABS Take 1 tablet (10 mg total) by mouth every 4 (four) hours as needed for pain. 180 tablet 0   Oxycodone  HCl 10 MG TABS Take 1 tablet (10 mg total) by mouth every 4 (four) hours as needed for pain. 180 tablet 0   Oxycodone  HCl 10 MG TABS Take 1 tablet (10 mg total) by mouth every 4 (four) hours as needed. 180 tablet 0   Oxycodone  HCl 10 MG TABS Take 1 tablet (10 mg total) by mouth every 4 (four) hours as needed for pain. 90 tablet 0   Oxycodone  HCl 10 MG TABS Take 1 tablet (10 mg total) by mouth every 4 (four) hours  as needed for pain. 180 tablet 0   Oxycodone  HCl 10 MG TABS Take 1 tablet (10 mg total) by mouth every 4 (four) hours as needed for pain. 90 tablet 0   Oxycodone  HCl 10 MG TABS Take 1 tablet (10 mg total) by mouth every 4 (four) hours as needed for pain. 180 tablet 0   [START ON 06/20/2024] Oxycodone  HCl 10 MG TABS Take 1 tablet (10 mg total) by mouth every 4 (four) hours as needed for pain. 180 tablet 0   Oxycodone  HCl 10 MG TABS Take 1 tablet (10 mg total) by mouth every 4 (four) hours as needed for pain. 180 tablet 0   pregabalin  (LYRICA ) 150 MG capsule Take 1 capsule (150 mg total) by mouth in the morning, at noon, and at bedtime. 90 capsule 2   terbinafine  (LAMISIL ) 1 % cream Apply 1 Application topically 2 (two) times daily. 30 g 0   trimethoprim  (TRIMPEX ) 100 MG tablet Take 1 tablet (100 mg total) by mouth daily. 90 tablet 3   umeclidinium-vilanterol (ANORO ELLIPTA ) 62.5-25 MCG/ACT AEPB Inhale 1 puff into the lungs daily. 60 each 6   vitamin B-12 (CYANOCOBALAMIN ) 100 MCG tablet Take 100 mcg by mouth daily. gummy     acetaminophen  (TYLENOL ) 650 MG CR tablet Take 1,300 mg by mouth every 8 (eight) hours as needed for pain. (Patient not taking: Reported on  05/30/2024)     lidocaine -prilocaine  (EMLA ) cream Apply topically. (Patient not taking: Reported on 05/30/2024)     pravastatin  (PRAVACHOL ) 10 MG tablet Take 1 tablet (10 mg total) by mouth daily. (Patient not taking: Reported on 05/30/2024) 90 tablet 3   Current Facility-Administered Medications on File Prior to Visit  Medication Dose Route Frequency Provider Last Rate Last Admin   acetaminophen  (TYLENOL ) 325 MG tablet            diphenhydrAMINE  (BENADRYL ) 25 mg capsule            goserelin (ZOLADEX ) injection 3.6 mg  3.6 mg Subcutaneous Q28 days Rao, Archana C, MD   3.6 mg at 02/02/23 1206   heparin  lock flush 100 unit/mL  500 Units Intravenous Once Rao, Archana C, MD       Zoledronic  Acid (ZOMETA ) 4 MG/100ML IVPB             Allergies:  Allergies  Allergen Reactions   Morphine Nausea And Vomiting and Other (See Comments)    migranes Other reaction(s): Headache Migraine, vomiting   Sertraline Hcl     REACTION: Worsened symptoms of IBS   Sulfa Antibiotics Rash   Sulfamethoxazole Rash   Sulfonamide Derivatives Rash   Past Medical History:  Past Medical History:  Diagnosis Date   Anemia    Anginal pain (HCC)    excertion not cardiac related per patient   Anxiety    Breast cancer (HCC) 12/2021   left breast IMC   Diverticulitis    Dyspnea    Dysrhythmia    prolong QT   Family history of ovarian cancer    GERD (gastroesophageal reflux disease)    Headache    IBS (irritable bowel syndrome)    Neuromuscular disorder (HCC)    neuropathy from chemo   Pneumonia    PONV (postoperative nausea and vomiting)    severe migraine and vomiting post anesthesia   Scoliosis    Past Surgical History:  Past Surgical History:  Procedure Laterality Date   ABDOMINAL HYSTERECTOMY     still has ovaries, no gyn cancer, hysterectomy  due to endometriosis. NO cervix on exam 01/10/21   BACK SURGERY     BREAST BIOPSY Left 09/15/2020   us  bx of mass, path pending, Q marker   BREAST BIOPSY Left  09/15/2020   us  bx of LN, hydromarker, path pending   BREAST RECONSTRUCTION WITH PLACEMENT OF TISSUE EXPANDER AND FLEX HD (ACELLULAR HYDRATED DERMIS) Bilateral 03/17/2021   Procedure: IMMEDIATE BILATERAL BREAST RECONSTRUCTION WITH PLACEMENT OF TISSUE EXPANDER AND FLEX HD (ACELLULAR HYDRATED DERMIS);  Surgeon: Lowery Estefana RAMAN, DO;  Location: [facility];  Service: Plastics;  Laterality: Bilateral;   CAPSULECTOMY Right 10/18/2022   Procedure: Right breast capsule release with adjustment;  Surgeon: Lowery Estefana RAMAN, DO;  Location: [facility];  Service: Plastics;  Laterality: Right;   COLONOSCOPY WITH PROPOFOL  N/A 11/21/2021   Procedure: COLONOSCOPY WITH PROPOFOL ;  Surgeon: Unk Corinn Skiff, MD;  Location: [facility];  Service: Gastroenterology;  Laterality: N/A;   FLEXIBLE BRONCHOSCOPY Bilateral 12/01/2022   Procedure: FLEXIBLE BRONCHOSCOPY;  Surgeon: Isadora Hose, MD;  Location: [facility];  Service: Pulmonary;  Laterality: Bilateral;   HAND SURGERY Right 10/2023   IR IMAGING GUIDED PORT INSERTION  10/01/2020   MODIFIED MASTECTOMY Left 03/17/2021   Procedure: LEFT MODIFIED RADICAL MASTECTOMY;  Surgeon: Vanderbilt Ned, MD;  Location: [facility];  Service: General;  Laterality: Left;   PORTA CATH REMOVAL Right 03/17/2021   Procedure: PORTA CATH REMOVAL;  Surgeon: Vanderbilt Ned, MD;  Location: [facility];  Service: General;  Laterality: Right;   REMOVAL OF BILATERAL TISSUE EXPANDERS WITH PLACEMENT OF BILATERAL BREAST IMPLANTS Bilateral 05/09/2021   Procedure: REMOVAL OF BILATERAL TISSUE EXPANDERS WITH PLACEMENT OF BILATERAL BREAST IMPLANTS;  Surgeon: Lowery Estefana RAMAN, DO;  Location: [facility];  Service: Plastics;  Laterality: Bilateral;  90 min   saginal band repair and capsule release  10/2023   TOTAL MASTECTOMY Right 03/17/2021   Procedure: TOTAL MASTECTOMY;  Surgeon: Vanderbilt Ned, MD;  Location:  [facility];  Service: General;  Laterality: Right;   Social History:  Social History   Socioeconomic History   Marital status: Significant Other    Spouse name: Not on file   Number of children: 2   Years of education: Not on file   Highest education level: Not on file  Occupational History   Occupation: Nurse    Employer: [employer]  Tobacco Use   Smoking status: Every Day    Current packs/day: 1.00    Average packs/day: 1 pack/day for 20.0 years (20.0 ttl pk-yrs)    Types: Cigarettes   Smokeless tobacco: Never   Tobacco comments:    1-2 cigarettes weekly- 09/12/2023  Vaping Use   Vaping status: Never Used  Substance and Sexual Activity   Alcohol use: Not Currently   Drug use: No   Sexual activity: Not Currently    Birth control/protection: Surgical    Comment: hyst  Other Topics Concern   Not on file  Social History Narrative   Patient works as an Insurance underwriter at [employer]. She has 2 children at home who have special needs. She and her spouse are primary caregivers.    Social Drivers of Corporate investment banker Strain: Not on file  Food Insecurity: No Food Insecurity (03/21/2023)   Hunger Vital Sign    Worried About Running Out of Food in the Last Year: Never true    Ran Out of Food in the Last Year: Never true  Transportation Needs: No  Transportation Needs (03/21/2023)   PRAPARE - Administrator, Civil Service (Medical): No    Lack of Transportation (Non-Medical): No  Physical Activity: Not on file  Stress: Not on file  Social Connections: Not on file  Intimate Partner Violence: Not At Risk (03/21/2023)   Humiliation, Afraid, Rape, and Kick questionnaire    Fear of Current or Ex-Partner: No    Emotionally Abused: No    Physically Abused: No    Sexually Abused: No   Family History:  Family History  Problem Relation Age of Onset   Hypertension Mother    Osteoarthritis Mother    Diverticulitis Mother    Heart failure Father     Hypertension Father    Gout Father    Heart attack Father 43   Diverticulitis Brother    Ovarian cancer Paternal Grandmother     Review of Systems: Constitutional: Doesn't report fevers, chills or abnormal weight loss Eyes: Doesn't report blurriness of vision Ears, nose, mouth, throat, and face: Doesn't report sore throat Respiratory: Doesn't report cough, dyspnea or wheezes Cardiovascular: Doesn't report palpitation, chest discomfort  Gastrointestinal:  Doesn't report nausea, constipation, diarrhea GU: Doesn't report incontinence Skin: Doesn't report skin rashes Neurological: Per HPI Musculoskeletal: Doesn't report joint pain Behavioral/Psych: ++anxiety  Physical Exam: Vitals:   05/30/24 1020  BP: 105/73  Pulse: 72  Resp: 19  Temp: 98.6 F (37 C)  SpO2: 98%    KPS: 90. General: Alert, cooperative, pleasant, in no acute distress Head: Normal EENT: No conjunctival injection or scleral icterus.  Lungs: Resp effort normal Cardiac: Regular rate Abdomen: Non-distended abdomen Skin: No rashes cyanosis or petechiae. Extremities: No clubbing or edema  Neurologic Exam: Mental Status: Awake, alert, attentive to examiner. Oriented to self and environment. Language is fluent with intact comprehension.  Cranial Nerves: Visual acuity is grossly normal. Visual fields are full. Extra-ocular movements intact. No ptosis. Face is symmetric Motor: Tone and bulk are normal. Power is full in both arms and legs. Reflexes are diminished, no pathologic reflexes present.  Sensory: Stocking neuropathy changes Gait: Normal.   Labs: I have reviewed the data as listed    Component Value Date/Time   NA 141 05/21/2024 1043   K 4.6 05/21/2024 1043   CL 104 05/21/2024 1043   CO2 28 05/21/2024 1043   GLUCOSE 80 05/21/2024 1043   BUN 11 05/21/2024 1043   CREATININE 0.98 05/21/2024 1043   CREATININE 0.84 12/08/2022 1216   CALCIUM  9.6 05/21/2024 1043   PROT 6.4 05/21/2024 1043   ALBUMIN 4.0  05/21/2024 1043   AST 134 (H) 05/21/2024 1043   AST 20 12/08/2022 1216   ALT 80 (H) 05/21/2024 1043   ALT 27 12/08/2022 1216   ALKPHOS 294 (H) 05/21/2024 1043   BILITOT 0.5 05/21/2024 1043   BILITOT 0.5 12/08/2022 1216   GFRNONAA >60 06/01/2023 1051   GFRNONAA >60 12/08/2022 1216   GFRAA >90 05/09/2012 1731   Lab Results  Component Value Date   WBC 9.8 05/21/2024   NEUTROABS 5.3 05/21/2024   HGB 14.6 05/21/2024   HCT 45.3 05/21/2024   MCV 86.5 05/21/2024   PLT 149.0 (L) 05/21/2024   Imaging:  [facility] Clinician Interpretation: I have personally reviewed the CNS images as listed.  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 EXAM: MRI HEAD WITHOUT AND  WITH CONTRAST TECHNIQUE: Multiplanar, multiecho pulse sequences of the brain and surrounding structures were obtained without and with intravenous contrast. CONTRAST:  5mL GADAVIST  GADOBUTROL  1 MMOL/ML IV SOLN COMPARISON:  Prior brain MRI examinations 05/25/2023 and earlier. MRA head 05/25/2023. FINDINGS: Brain: No age-advanced or lobar predominant cerebral atrophy. Punctate 2 mm nodular focus of enhancement along the medial right frontal lobe, unchanged in size and appearance (for instance as seen on series 18, image 129 and series 20, image 13). Multifocal T2 FLAIR hyperintense signal abnormality within the cerebral white matter and pons, nonspecific but compatible with moderate chronic small vessel ischemic disease. These findings are greater than expected for age. There is no acute infarct. No chronic intracranial blood products. No extra-axial fluid collection. No midline shift. Vascular: Intracranial arterial findings separately reported on same day MRA head. Skull and upper cervical spine: No focal worrisome marrow lesion. Sinuses/Orbits: No mass or acute  finding within the imaged orbits. No significant paranasal sinus disease. IMPRESSION: 1. Unchanged punctate nodular enhancing focus at the medial right frontal lobe. 2. Age-advanced chronic small vessel ischemic changes within the cerebral white matter and pons. 3. Intracranial arterial findings reported separately on same day MRA head. Electronically Signed   By: Rockey Childs D.O.   On: 05/19/2024 12:03   MR ANGIO HEAD WO CONTRAST Result Date: 05/19/2024 CLINICAL DATA:  Cerebral aneurysm, untreated. EXAM: MRA HEAD WITHOUT CONTRAST TECHNIQUE: Angiographic images of the Circle of Willis were acquired using MRA technique without intravenous contrast. COMPARISON:  MRA head 05/25/2023. FINDINGS: Anterior circulation: The intracranial internal carotid arteries are patent bilaterally. There is a 2.6 mm outpouching along the supraclinoid left ICA favored to reflect an infundibulum. Additional 2.7 x 1.6 mm aneurysm at the left MCA bifurcation projecting posteriorly which is similar to prior. There is a 2.1 mm inferiorly and slightly posteriorly projecting aneurysm at the right MCA bifurcation. The MCAs and ACAs are patent bilaterally. Posterior circulation: The distal intracranial vertebral arteries are patent. The basilar artery is patent. Posteriorly projecting aneurysm at the basilar tip measures 2.7 x 2.3 mm similar to prior. Posterior cerebral arteries are patent bilaterally. The superior cerebellar arteries are patent bilaterally. AICA visualized proximally. PICA patent bilaterally. Anatomic variants: None significant. Other: None. 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 Additional aneurysm versus infundibulum along the left supraclinoid ICA, unchanged. Electronically Signed   By: Donnice Mania M.D.   On: 05/19/2024 11:51     Assessment/Plan Brain  aneurysm  Tricia Berry presents with clinical syndrome consistent with symmetric, length dependent, small and large fiber peripheral neuropathy.  Etiology is exposure to taxol  chemotherapy.  She will con't Lyrica  150mg  TID as prior.  Will con't 5mg  baclofen  TID prn for spasticity symptoms.  MRI brain demonstrates stable enhancing right frontal lesion.  Etiology is unlikely neoplastic based on stability over 18 months.    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  She is asymptomatic and has not experienced any bleeding. PHASES score is 4, which is considered low risk.     We will recommend continued annual surveillance of vasculature x2 years, then less often if stable.  She is agreeable with this.  We appreciate the opportunity to participate in the care of Tricia Berry.   We ask that Tricia Berry return to clinic in 12 months  following next brain MRI/MRA, or sooner as needed.  All questions were answered. The patient knows to call the clinic with any problems, questions or concerns. No barriers to learning were detected.  The total time spent in the encounter was 40 minutes and more than 50% was on counseling and review of test results   Arthea MARLA Manns, MD Medical Director of Neuro-Oncology [facility] 05/30/24 10:31 AM

## 2024-06-03 ENCOUNTER — Telehealth (INDEPENDENT_AMBULATORY_CARE_PROVIDER_SITE_OTHER): Admitting: Plastic Surgery

## 2024-06-03 DIAGNOSIS — G8929 Other chronic pain: Secondary | ICD-10-CM

## 2024-06-03 DIAGNOSIS — N651 Disproportion of reconstructed breast: Secondary | ICD-10-CM

## 2024-06-03 NOTE — Progress Notes (Signed)
 Called twice and went to voicemail.  Will request follow-up visit.

## 2024-06-05 ENCOUNTER — Telehealth: Payer: Self-pay

## 2024-06-05 DIAGNOSIS — R899 Unspecified abnormal finding in specimens from other organs, systems and tissues: Secondary | ICD-10-CM

## 2024-06-05 NOTE — Telephone Encounter (Signed)
 Lipid panel added pt has been notified

## 2024-06-05 NOTE — Telephone Encounter (Signed)
 Copied from CRM [id number]. Topic: Clinical - Request for Lab/Test Order >> Jun 05, 2024 11:19 AM Robinson DEL wrote: Reason for CRM: Patient scheduled her lab appointment for 8/19 and states that it should also be an order for lipid panel labs  [place] [phone]

## 2024-06-05 NOTE — Addendum Note (Signed)
 Addended by: Cecile Guevara on: 06/05/2024 12:16 PM   Modules accepted: Orders

## 2024-06-10 ENCOUNTER — Other Ambulatory Visit (INDEPENDENT_AMBULATORY_CARE_PROVIDER_SITE_OTHER)

## 2024-06-10 DIAGNOSIS — R899 Unspecified abnormal finding in specimens from other organs, systems and tissues: Secondary | ICD-10-CM | POA: Diagnosis not present

## 2024-06-10 LAB — CBC WITH DIFFERENTIAL/PLATELET
Basophils Absolute: 0 K/uL (ref 0.0–0.1)
Basophils Relative: 0.4 % (ref 0.0–3.0)
Eosinophils Absolute: 0 K/uL (ref 0.0–0.7)
Eosinophils Relative: 0.4 % (ref 0.0–5.0)
HCT: 41.5 % (ref 36.0–46.0)
Hemoglobin: 13.6 g/dL (ref 12.0–15.0)
Lymphocytes Relative: 42.8 % (ref 12.0–46.0)
Lymphs Abs: 3.5 K/uL (ref 0.7–4.0)
MCHC: 32.9 g/dL (ref 30.0–36.0)
MCV: 86 fl (ref 78.0–100.0)
Monocytes Absolute: 0.4 K/uL (ref 0.1–1.0)
Monocytes Relative: 5 % (ref 3.0–12.0)
Neutro Abs: 4.2 K/uL (ref 1.4–7.7)
Neutrophils Relative %: 51.4 % (ref 43.0–77.0)
Platelets: 83 K/uL — ABNORMAL LOW (ref 150.0–400.0)
RBC: 4.83 Mil/uL (ref 3.87–5.11)
RDW: 19.6 % — ABNORMAL HIGH (ref 11.5–15.5)
WBC: 8.3 K/uL (ref 4.0–10.5)

## 2024-06-10 LAB — LIPID PANEL
Cholesterol: 157 mg/dL (ref 0–200)
HDL: 31.9 mg/dL — ABNORMAL LOW (ref >39.00)
LDL Cholesterol: 77 mg/dL (ref 0–99)
NonHDL: 124.65
Total CHOL/HDL Ratio: 5
Triglycerides: 239 mg/dL — ABNORMAL HIGH (ref 0.0–149.0)
VLDL: 47.8 mg/dL — ABNORMAL HIGH (ref 0.0–40.0)

## 2024-06-10 LAB — HEPATIC FUNCTION PANEL
ALT: 80 U/L — ABNORMAL HIGH (ref 0–35)
AST: 179 U/L — ABNORMAL HIGH (ref 0–37)
Albumin: 3.9 g/dL (ref 3.5–5.2)
Alkaline Phosphatase: 331 U/L — ABNORMAL HIGH (ref 39–117)
Bilirubin, Direct: 0.4 mg/dL — ABNORMAL HIGH (ref 0.0–0.3)
Total Bilirubin: 1 mg/dL (ref 0.2–1.2)
Total Protein: 6.7 g/dL (ref 6.0–8.3)

## 2024-06-10 LAB — GAMMA GT: GGT: 570 U/L — ABNORMAL HIGH (ref 7–51)

## 2024-06-13 LAB — ALKALINE PHOSPHATASE, ISOENZYMES
Alkaline Phosphatase: 399 IU/L — ABNORMAL HIGH (ref 44–121)
BONE FRACTION: 43 (ref 14–68)
INTESTINAL FRAC.: 0 (ref 0–18)
LIVER FRACTION: 57 (ref 18–85)

## 2024-06-17 ENCOUNTER — Ambulatory Visit: Payer: Self-pay | Admitting: Family

## 2024-06-17 DIAGNOSIS — R748 Abnormal levels of other serum enzymes: Secondary | ICD-10-CM

## 2024-06-19 ENCOUNTER — Telehealth: Payer: Self-pay | Admitting: Family

## 2024-06-19 ENCOUNTER — Other Ambulatory Visit (INDEPENDENT_AMBULATORY_CARE_PROVIDER_SITE_OTHER)

## 2024-06-19 ENCOUNTER — Other Ambulatory Visit: Payer: Self-pay | Admitting: Family

## 2024-06-19 ENCOUNTER — Other Ambulatory Visit: Payer: Self-pay

## 2024-06-19 DIAGNOSIS — D696 Thrombocytopenia, unspecified: Secondary | ICD-10-CM

## 2024-06-19 DIAGNOSIS — B351 Tinea unguium: Secondary | ICD-10-CM

## 2024-06-19 MED ORDER — CICLOPIROX 8 % EX SOLN
CUTANEOUS | 0 refills | Status: DC
Start: 2024-06-19 — End: 2024-09-09
  Filled 2024-06-26: qty 6.6, 30d supply, fill #0

## 2024-06-19 NOTE — Addendum Note (Signed)
 Addended by: MARYLEN PRO A on: 06/19/2024 02:05 PM   Modules accepted: Orders

## 2024-06-19 NOTE — Telephone Encounter (Signed)
 Spoke to [place] Dr Unk nurse and explained everything that has been going on per note below, she stated that she will speak to Dr Unk and they will reach out to pt to schedule her

## 2024-06-19 NOTE — Telephone Encounter (Signed)
 Called and spoke with patient after received my chart note  Denies fever, chills.  She has episodic diffuse upper abdominal pain.  Appetite is poor.  She was able to eat half a biscuit this morning and a drink a cup of coffee.  She is not drinking a lot of water.  Endorses feeling overall weak. She has had episodes of vomiting as well as feeling nauseated.  Abdominal pain is worse with eating.  Discussed my concerns in regards to history of breast cancer, D/D including hepatitis, cholelithiasis.  Strongly urged her to go to the emergency room to expedite safe evaluation, prompt diagnosis.  She adamantly declines however states if symptoms were to worsen, she will go.  Advised I will work diligently to arrange right upper quadrant ultrasound and follow-up with Dr. Unk.  Patient verbally patient verbalized understanding

## 2024-06-19 NOTE — Telephone Encounter (Signed)
 Call pt Please let her know that Dr Unk is aware of labs.  She has also included oncology, Dr Melanee on labs so that she is aware.  For now , keep the ultrasound of liver as scheduled. Awaiting Dr Melanee whom may advise CT abdomen and pelvis.   Dr Unk advised ordering PT/INR lab due to precipitous drop in platelets  I have ordered   Please see if she can come to our lab this afternoon to have drawn

## 2024-06-19 NOTE — Telephone Encounter (Signed)
 Spoke to pt husband he informed pt of information below scheduled appt in our lab for 2 pm today

## 2024-06-20 ENCOUNTER — Telehealth: Payer: Self-pay

## 2024-06-20 ENCOUNTER — Other Ambulatory Visit: Payer: Self-pay | Admitting: *Deleted

## 2024-06-20 ENCOUNTER — Other Ambulatory Visit: Payer: Self-pay | Admitting: Family

## 2024-06-20 ENCOUNTER — Other Ambulatory Visit: Payer: Self-pay

## 2024-06-20 ENCOUNTER — Ambulatory Visit
Admission: RE | Admit: 2024-06-20 | Discharge: 2024-06-20 | Disposition: A | Discharge status: Home / Self Care | Source: Ambulatory Visit | Attending: Family | Admitting: Family

## 2024-06-20 ENCOUNTER — Telehealth (INDEPENDENT_AMBULATORY_CARE_PROVIDER_SITE_OTHER): Admitting: Student

## 2024-06-20 DIAGNOSIS — F17200 Nicotine dependence, unspecified, uncomplicated: Secondary | ICD-10-CM | POA: Diagnosis not present

## 2024-06-20 DIAGNOSIS — Z859 Personal history of malignant neoplasm, unspecified: Secondary | ICD-10-CM | POA: Diagnosis not present

## 2024-06-20 DIAGNOSIS — R110 Nausea: Secondary | ICD-10-CM

## 2024-06-20 DIAGNOSIS — R748 Abnormal levels of other serum enzymes: Secondary | ICD-10-CM | POA: Insufficient documentation

## 2024-06-20 DIAGNOSIS — Z923 Personal history of irradiation: Secondary | ICD-10-CM

## 2024-06-20 DIAGNOSIS — C50212 Malignant neoplasm of upper-inner quadrant of left female breast: Secondary | ICD-10-CM

## 2024-06-20 DIAGNOSIS — C787 Secondary malignant neoplasm of liver and intrahepatic bile duct: Secondary | ICD-10-CM

## 2024-06-20 DIAGNOSIS — N651 Disproportion of reconstructed breast: Secondary | ICD-10-CM | POA: Diagnosis not present

## 2024-06-20 LAB — PROTIME-INR
INR: 1.1
Prothrombin Time: 11.9 s — ABNORMAL HIGH (ref 9.0–11.5)

## 2024-06-20 MED ORDER — ONDANSETRON 4 MG PO TBDP
4.0000 mg | ORAL_TABLET | Freq: Three times a day (TID) | ORAL | 1 refills | Status: DC | PRN
  Filled 2024-06-20: qty 30, 10d supply, fill #0

## 2024-06-20 NOTE — Telephone Encounter (Signed)
 Sending to doc of day for review. Pcp currently out of office.  Received called from Radiology room about pt's ultrasound results  Randine stated us  results were read and wanted pcp to know of the findings.  IMPRESSION: 1. Extensive Liver Metastases.  No right upper quadrant free fluid. 2. Negative gallbladder. No evidence of bile duct obstruction.

## 2024-06-20 NOTE — Telephone Encounter (Signed)
 Noted. Thanks.

## 2024-06-20 NOTE — Telephone Encounter (Signed)
 Okay

## 2024-06-20 NOTE — Progress Notes (Signed)
   Referring Provider Dineen Rollene MATSU, FNP [address] [place],  [place] [address]   CC: Follow Up     Tricia Berry is an 53 y.o. female.  HPI: Patient is a 53 year old female with history of breast cancer status post breast reconstruction.  She presents for video visit today to discuss smoking cessation.  Patient had been seen in the clinic on 02/29/2024.  At this visit, it was noted that patient underwent bilateral mastectomies with expander placement in 2022 and then had implants placed in July 2022.  Patient had 320 cc saline implants placed.  Patient then had radiation to the left breast.  Patient then had surgery in December 2023 for capsulectomy of the right side.  Patient reported that she was still smoking, although she was working on quitting and was now down to about 1 cigarette/week.  Patient was requesting to see if she could have more symmetry with fat grafting.  It was noted that fat grafting was thought to be reasonable.  Plan was to move forward with the fat grafting once the patient was 3 months tobacco free.  Today, patient reports she is doing overall well.  She states that she recently has had elevated liver enzymes and that she is going for an ultrasound of her right upper quadrant to further evaluate her liver.  Patient states that in terms of smoking cessation, she is still smoking and she most recently smoked yesterday.  Review of Systems General: Does not report any fevers or chills  Physical Exam General:  No acute distress,  Alert and oriented, Non-Toxic, Normal speech and affect  Assessment/Plan TOBACCO ABUSE  Breast asymmetry following reconstructive surgery  I discussed with the patient the importance of making sure that she is smoke-free for at least 3 months prior to surgery.  We discussed wound healing complications and issues with the fat grafting if she is still smoking.  She expressed understanding.  I recommended that she reach out to  her primary care provider to help her with smoking cessation.  She expressed understanding.  I also discussed with the patient that she will need to figure out what is going on with her elevated liver enzymes prior to surgery.  She was in agreement with this.  We will plan to follow back up in 3 months.  I instructed her to call in the meantime if she has any questions or concerns about anything.  The patient gave consent to have this visit done by telemedicine / virtual visit, two identifiers were used to identify patient. This is also consent for access the chart and treat the patient via this visit. The patient is located at home.  I, the provider, am at the office.  We spent 5 minutes together for the visit.  Joined by video call.    Tricia Berry 06/20/2024, 9:41 AM

## 2024-06-24 ENCOUNTER — Telehealth: Payer: Self-pay | Admitting: Oncology

## 2024-06-24 ENCOUNTER — Ambulatory Visit
Admission: RE | Admit: 2024-06-24 | Discharge: 2024-06-24 | Disposition: A | Discharge status: Home / Self Care | Source: Ambulatory Visit | Attending: Internal Medicine | Admitting: Internal Medicine

## 2024-06-24 ENCOUNTER — Ambulatory Visit: Payer: Self-pay | Admitting: Family

## 2024-06-24 DIAGNOSIS — C50212 Malignant neoplasm of upper-inner quadrant of left female breast: Secondary | ICD-10-CM | POA: Diagnosis present

## 2024-06-24 DIAGNOSIS — C787 Secondary malignant neoplasm of liver and intrahepatic bile duct: Secondary | ICD-10-CM | POA: Diagnosis present

## 2024-06-24 MED ORDER — IOHEXOL 300 MG/ML  SOLN
80.0000 mL | Freq: Once | INTRAMUSCULAR | Status: AC | PRN
  Administered 2024-06-24: 80 mL via INTRAVENOUS

## 2024-06-24 MED ORDER — IOHEXOL 9 MG/ML PO SOLN
500.0000 mL | ORAL | Status: AC
  Administered 2024-06-24 (×2): 500 mL via ORAL

## 2024-06-24 NOTE — Telephone Encounter (Signed)
 Called pt to schedule STAT CT - left vm - LH

## 2024-06-24 NOTE — Progress Notes (Signed)
 Jenna Cordella LABOR, MD sent to [facility] S PROCEDURE / BIOPSY REVIEW Date: 06/24/24  Requested Biopsy site: liver Reason for request: liver masses Imaging review: Best seen on U/S  Decision: Approved Imaging modality to perform: Ultrasound Schedule with: Moderate Sedation Schedule for: Any VIR  Additional comments: @Schedulers .    Please contact me with questions, concerns, or if issue pertaining to this request arise.  Cordella LABOR Jenna, MD [facility]

## 2024-06-24 NOTE — Telephone Encounter (Signed)
 Secure what with dr amey and phone call 06/20/24; I also spoke with pt who is aware of RUQ us . She declines going to ED for fluids, nausea. Refilled prn zofran   Pending ct chest a/p 06/24/24 and appt 06/27/24 with dr rao

## 2024-06-25 ENCOUNTER — Telehealth: Payer: Self-pay | Admitting: *Deleted

## 2024-06-25 ENCOUNTER — Telehealth: Payer: Self-pay

## 2024-06-25 NOTE — Telephone Encounter (Signed)
 Per note in chart from [facility] Pt called to let Tricia Berry know that she was able to r/s her appts and can do the biopsy on Monday the 15.

## 2024-06-25 NOTE — Telephone Encounter (Signed)
 Pt called to let [place] know that she was able to r/s her appts and can do the biopsy on Monday the 15.

## 2024-06-25 NOTE — Telephone Encounter (Addendum)
 Voicemail received from patient yesterday 06/24/24 at 4:33PM indicating that she needs to reschedule her liver biopsy set for 9/15 @ 10am; says she has another appointment at this time. I will put in a Patient's best contact number [phone]. Outbound call to patient and requested if there's anyway possible she can reschedule the other appt for 9/15.  Emphasized the importance of keeping the liver biopsy and that there's limited availability.  Patient agreed she will attempt to reschedule other appointment. Asked patient to give us  a call back later today; will also try to reach out to patient before end of day.  Patient inquired about GI referral; informed referral was sent by PCP on 06/17/24, provided telephone number to [facility] to inquire if she'd like before being contacted. Patient verbalized understanding.

## 2024-06-26 ENCOUNTER — Other Ambulatory Visit: Payer: Self-pay

## 2024-06-27 ENCOUNTER — Telehealth: Payer: Self-pay

## 2024-06-27 ENCOUNTER — Encounter: Payer: Self-pay | Admitting: Oncology

## 2024-06-27 ENCOUNTER — Inpatient Hospital Stay

## 2024-06-27 ENCOUNTER — Other Ambulatory Visit: Payer: Self-pay | Admitting: *Deleted

## 2024-06-27 ENCOUNTER — Inpatient Hospital Stay: Attending: Oncology | Admitting: Oncology

## 2024-06-27 VITALS — BP 109/77 | HR 73 | Temp 98.7°F | Resp 20 | Wt 103.2 lb

## 2024-06-27 DIAGNOSIS — Z170 Estrogen receptor positive status [ER+]: Secondary | ICD-10-CM | POA: Insufficient documentation

## 2024-06-27 DIAGNOSIS — C50212 Malignant neoplasm of upper-inner quadrant of left female breast: Secondary | ICD-10-CM

## 2024-06-27 DIAGNOSIS — R5383 Other fatigue: Secondary | ICD-10-CM | POA: Insufficient documentation

## 2024-06-27 DIAGNOSIS — C787 Secondary malignant neoplasm of liver and intrahepatic bile duct: Secondary | ICD-10-CM

## 2024-06-27 DIAGNOSIS — R7989 Other specified abnormal findings of blood chemistry: Secondary | ICD-10-CM | POA: Diagnosis not present

## 2024-06-27 DIAGNOSIS — G893 Neoplasm related pain (acute) (chronic): Secondary | ICD-10-CM | POA: Diagnosis not present

## 2024-06-27 DIAGNOSIS — Z7189 Other specified counseling: Secondary | ICD-10-CM | POA: Diagnosis not present

## 2024-06-27 DIAGNOSIS — G9340 Encephalopathy, unspecified: Secondary | ICD-10-CM | POA: Insufficient documentation

## 2024-06-27 DIAGNOSIS — F17210 Nicotine dependence, cigarettes, uncomplicated: Secondary | ICD-10-CM | POA: Diagnosis not present

## 2024-06-27 DIAGNOSIS — C7951 Secondary malignant neoplasm of bone: Secondary | ICD-10-CM | POA: Diagnosis not present

## 2024-06-27 LAB — CBC WITH DIFFERENTIAL (CANCER CENTER ONLY)
Abs Immature Granulocytes: 0.17 K/uL — ABNORMAL HIGH (ref 0.00–0.07)
Basophils Absolute: 0.1 K/uL (ref 0.0–0.1)
Basophils Relative: 1 %
Eosinophils Absolute: 0 K/uL (ref 0.0–0.5)
Eosinophils Relative: 0 %
HCT: 39.8 % (ref 36.0–46.0)
Hemoglobin: 12.8 g/dL (ref 12.0–15.0)
Immature Granulocytes: 2 %
Lymphocytes Relative: 37 %
Lymphs Abs: 2.9 K/uL (ref 0.7–4.0)
MCH: 28.8 pg (ref 26.0–34.0)
MCHC: 32.2 g/dL (ref 30.0–36.0)
MCV: 89.6 fL (ref 80.0–100.0)
Monocytes Absolute: 0.5 K/uL (ref 0.1–1.0)
Monocytes Relative: 6 %
Neutro Abs: 4.2 K/uL (ref 1.7–7.7)
Neutrophils Relative %: 54 %
Platelet Count: 55 K/uL — ABNORMAL LOW (ref 150–400)
RBC: 4.44 MIL/uL (ref 3.87–5.11)
RDW: 21.2 % — ABNORMAL HIGH (ref 11.5–15.5)
Smear Review: NORMAL
WBC Count: 7.8 K/uL (ref 4.0–10.5)
nRBC: 4.5 % — ABNORMAL HIGH (ref 0.0–0.2)

## 2024-06-27 LAB — CMP (CANCER CENTER ONLY)
ALT: 71 U/L — ABNORMAL HIGH (ref 0–44)
AST: 219 U/L (ref 15–41)
Albumin: 3.5 g/dL (ref 3.5–5.0)
Alkaline Phosphatase: 421 U/L — ABNORMAL HIGH (ref 38–126)
Anion gap: 12 (ref 5–15)
BUN: 15 mg/dL (ref 6–20)
CO2: 24 mmol/L (ref 22–32)
Calcium: 11.2 mg/dL — ABNORMAL HIGH (ref 8.9–10.3)
Chloride: 101 mmol/L (ref 98–111)
Creatinine: 1.23 mg/dL — ABNORMAL HIGH (ref 0.44–1.00)
GFR, Estimated: 52 mL/min — ABNORMAL LOW (ref >60)
Glucose, Bld: 113 mg/dL — ABNORMAL HIGH (ref 70–99)
Potassium: 4.1 mmol/L (ref 3.5–5.1)
Sodium: 137 mmol/L (ref 135–145)
Total Bilirubin: 1.2 mg/dL (ref 0.0–1.2)
Total Protein: 6.9 g/dL (ref 6.5–8.1)

## 2024-06-27 NOTE — Telephone Encounter (Signed)
 Clinical Social Work was referred by [place] for [employer] [facility]. CSW attempted to contact patient by phone.  Left voicemail with contact information and request for return call.

## 2024-06-28 ENCOUNTER — Other Ambulatory Visit: Payer: Self-pay

## 2024-06-28 ENCOUNTER — Encounter: Payer: Self-pay | Admitting: Oncology

## 2024-06-28 LAB — CANCER ANTIGEN 15-3: CA 15-3: 1014 U/mL — ABNORMAL HIGH (ref 0.0–25.0)

## 2024-06-28 LAB — CANCER ANTIGEN 27.29: CA 27.29: 1806.6 U/mL — ABNORMAL HIGH (ref 0.0–38.6)

## 2024-06-28 MED ORDER — FENTANYL 12 MCG/HR TD PT72
1.0000 | MEDICATED_PATCH | TRANSDERMAL | 0 refills | Status: DC
  Filled 2024-06-28: qty 10, 30d supply, fill #0

## 2024-06-28 MED ORDER — FENTANYL 12 MCG/HR TD PT72: 1.0000 | MEDICATED_PATCH | TRANSDERMAL | 0 refills | Status: DC

## 2024-06-28 NOTE — Addendum Note (Signed)
 Addended by: MELANEE PIGGS C on: 06/28/2024 02:50 PM   Modules accepted: Orders

## 2024-06-28 NOTE — Progress Notes (Addendum)
 Hematology/Oncology Consult note [facility]  [phone] [phone]  Patient Care Team: Dineen Rollene MATSU, FNP as PCP - General (Family Medicine) Darliss Rogue, MD as PCP - Cardiology (Cardiology) Cindie Jesusa HERO, RN as Oncology Nurse Navigator Melanee Annah BROCKS, MD as Consulting Physician (Hematology and Oncology) Bula Powell PARAS, RN as Registered Nurse (Oncology) Borders, Fonda SAUNDERS, NP as Nurse Practitioner (Hospice and Palliative Medicine) Isadora Hose, MD as Consulting Physician (Pulmonary Disease)   Name of the patient: Tricia Berry  [id number]  11/08/1970   Date of visit: 06/28/24  Diagnosis-  invasive mammary carcinoma of the left breast stage III ypT2 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  Ultrasound also showed 5 abnormal lymph nodes in the axilla with cortical thickening.  Both the mass and the lymph nodes were biopsied and was consistent with invasive mammary carcinoma grade 2.  Tumor was ER +91- 100%, PR +11 to 20% and HER-2 negative.  Ki-67 15%.    PET CT scan showed hypermetabolism in the area of the left breast but no evidence of hypermetabolism in the left axilla Or evidence of distant metastatic disease.   Neoadjuvant dose dense ACT chemotherapy started on 10/08/2020. Interim ultrasound after 4 cycles of dose dense AC chemotherapy showed overall decrease in volume of the tumor.  Nodularity previously seen on ultrasound was also decreased in size.   Patient completed neoadjuvant AC Taxol  chemotherapy and underwent bilateral mastectomy with reconstruction.Final pathology showed  fibrocystic changes in the right breast with no malignancy.  3.6 cm invasive mammary carcinoma in the left breast.  7 out of 16 lymph nodes positive for malignancy.  Treatment effect in the breast present but not robust.  Treatment effect in the lymph nodes minimal.  Margins negative.  Overall grade 2.  Extranodal extension present.  ER greater than 90% positive, PR 15% positive.  HER2 +2 equivocal and positive by FISH on the primary breast specimen.  HER2 testing was however negative in the lymph nodes.  Patient completed 1 year of adjuvant Herceptin  and Perjeta  in August 2023.  Patient could not tolerate adjuvant Verzenio  and stopped taking it. She is currently on Arimidex  alone.  She was previously on Arimidex  plus ovarian suppression which she started in June 2022.   patient sees Dr. 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     Interval history-patient has been feeling poorly over the last 3 months.  Appetite has gone down and she has lost significant weight.  Has on and off nausea.  ECOG PS- 2 Pain scale- 5 Opioid associated constipation- no  Review of systems- Review of Systems  Constitutional:  Positive for malaise/fatigue and weight loss. Negative for chills and fever.  HENT:  Negative for congestion, ear discharge and nosebleeds.   Eyes:  Negative for blurred vision.  Respiratory:  Negative for cough, hemoptysis, sputum production, shortness of breath and wheezing.   Cardiovascular:  Negative for chest pain, palpitations, orthopnea and claudication.  Gastrointestinal:  Positive for abdominal pain. Negative for blood in stool, constipation, diarrhea, heartburn, melena, nausea and vomiting.  Genitourinary:  Negative  for dysuria, flank pain, frequency, hematuria and urgency.  Musculoskeletal:  Negative for back pain, joint pain and myalgias.  Skin:  Negative for rash.  Neurological:  Negative for  dizziness, tingling, focal weakness, seizures, weakness and headaches.  Endo/Heme/Allergies:  Does not bruise/bleed easily.  Psychiatric/Behavioral:  Negative for depression and suicidal ideas. The patient does not have insomnia.       Allergies  Allergen Reactions   Morphine Nausea And Vomiting and Other (See Comments)    migranes Other reaction(s): Headache Migraine, vomiting   Sertraline Hcl     REACTION: Worsened symptoms of IBS   Sulfa Antibiotics Rash   Sulfamethoxazole Rash   Sulfonamide Derivatives Rash     Past Medical History:  Diagnosis Date   Anemia    Anginal pain (HCC)    excertion not cardiac related per patient   Anxiety    Breast cancer (HCC) 12/2021   left breast IMC   Diverticulitis    Dyspnea    Dysrhythmia    prolong QT   Family history of ovarian cancer    GERD (gastroesophageal reflux disease)    Headache    IBS (irritable bowel syndrome)    Neuromuscular disorder (HCC)    neuropathy from chemo   Pneumonia    PONV (postoperative nausea and vomiting)    severe migraine and vomiting post anesthesia   Scoliosis      Past Surgical History:  Procedure Laterality Date   ABDOMINAL HYSTERECTOMY     still has ovaries, no gyn cancer, hysterectomy due to endometriosis. NO cervix on exam 01/10/21   BACK SURGERY     BREAST BIOPSY Left 09/15/2020   us  bx of mass, path pending, Q marker   BREAST BIOPSY Left 09/15/2020   us  bx of LN, hydromarker, path pending   BREAST RECONSTRUCTION WITH PLACEMENT OF TISSUE EXPANDER AND FLEX HD (ACELLULAR HYDRATED DERMIS) Bilateral 03/17/2021   Procedure: IMMEDIATE BILATERAL BREAST RECONSTRUCTION WITH PLACEMENT OF TISSUE EXPANDER AND FLEX HD (ACELLULAR HYDRATED DERMIS);  Surgeon: Lowery Estefana RAMAN, DO;  Location: [facility];  Service: Plastics;  Laterality: Bilateral;   CAPSULECTOMY Right 10/18/2022   Procedure: Right breast capsule release with adjustment;  Surgeon: Lowery Estefana RAMAN, DO;  Location:  [facility];  Service: Plastics;  Laterality: Right;   COLONOSCOPY WITH PROPOFOL  N/A 11/21/2021   Procedure: COLONOSCOPY WITH PROPOFOL ;  Surgeon: Unk Corinn Skiff, MD;  Location: [facility];  Service: Gastroenterology;  Laterality: N/A;   FLEXIBLE BRONCHOSCOPY Bilateral 12/01/2022   Procedure: FLEXIBLE BRONCHOSCOPY;  Surgeon: Isadora Hose, MD;  Location: [facility];  Service: Pulmonary;  Laterality: Bilateral;   HAND SURGERY Right 10/2023   IR IMAGING GUIDED PORT INSERTION  10/01/2020   MODIFIED MASTECTOMY Left 03/17/2021   Procedure: LEFT MODIFIED RADICAL MASTECTOMY;  Surgeon: Vanderbilt Ned, MD;  Location: [facility];  Service: General;  Laterality: Left;   PORTA CATH REMOVAL Right 03/17/2021   Procedure: PORTA CATH REMOVAL;  Surgeon: Vanderbilt Ned, MD;  Location: [facility];  Service: General;  Laterality: Right;   REMOVAL OF BILATERAL TISSUE EXPANDERS WITH PLACEMENT OF BILATERAL BREAST IMPLANTS Bilateral 05/09/2021   Procedure: REMOVAL OF BILATERAL TISSUE EXPANDERS WITH PLACEMENT OF BILATERAL BREAST IMPLANTS;  Surgeon: Lowery Estefana RAMAN, DO;  Location: [facility];  Service: Plastics;  Laterality: Bilateral;  90 min   saginal band repair and capsule release  10/2023   TOTAL MASTECTOMY Right 03/17/2021   Procedure: TOTAL MASTECTOMY;  Surgeon: Vanderbilt Ned, MD;  Location: [facility];  Service: General;  Laterality: Right;    Social History   Socioeconomic History   Marital status: Significant Other    Spouse name: Not on file   Number of children: 2   Years of education: Not on file   Highest education level: Not on file  Occupational History   Occupation: Nurse    Employer: [employer]  Tobacco Use   Smoking status: Every Day    Current packs/day: 1.00    Average packs/day: 1 pack/day for 20.0 years (20.0 ttl pk-yrs)    Types: Cigarettes   Smokeless tobacco: Never   Tobacco comments:    1-2  cigarettes weekly- 09/12/2023  Vaping Use   Vaping status: Never Used  Substance and Sexual Activity   Alcohol use: Not Currently   Drug use: No   Sexual activity: Not Currently    Birth control/protection: Surgical    Comment: hyst  Other Topics Concern   Not on file  Social History Narrative   Patient works as an Insurance underwriter at [employer]. She has 2 children at home who have special needs. She and her spouse are primary caregivers.    Social Drivers of Corporate investment banker Strain: Not on file  Food Insecurity: No Food Insecurity (03/21/2023)   Hunger Vital Sign    Worried About Running Out of Food in the Last Year: Never true    Ran Out of Food in the Last Year: Never true  Transportation Needs: No Transportation Needs (03/21/2023)   PRAPARE - Administrator, Civil Service (Medical): No    Lack of Transportation (Non-Medical): No  Physical Activity: Not on file  Stress: Not on file  Social Connections: Not on file  Intimate Partner Violence: Not At Risk (03/21/2023)   Humiliation, Afraid, Rape, and Kick questionnaire    Fear of Current or Ex-Partner: No    Emotionally Abused: No    Physically Abused: No    Sexually Abused: No    Family History  Problem Relation Age of Onset   Hypertension Mother    Osteoarthritis Mother    Diverticulitis Mother    Heart failure Father    Hypertension Father    Gout Father    Heart attack Father 67   Diverticulitis Brother    Ovarian cancer Paternal Grandmother      Current Outpatient Medications:    albuterol  (PROVENTIL ) (2.5 MG/3ML) 0.083% nebulizer solution, Take 3 mLs (2.5 mg total) by nebulization every 6 (six) hours as needed for wheezing or shortness of breath., Disp: 360 mL, Rfl: 12   albuterol  (VENTOLIN  HFA) 108 (90 Base) MCG/ACT inhaler, Inhale 2 puffs into the lungs every 6 (six) hours as needed for wheezing or shortness of breath., Disp: 6.7 g, Rfl: 0   anastrozole  (ARIMIDEX ) 1 MG tablet, Take 1 tablet (1 mg  total) by mouth daily., Disp: 30 tablet, Rfl: 5   b complex vitamins capsule, Take 1 capsule by mouth daily., Disp: , Rfl:    baclofen  (LIORESAL ) 10 MG tablet, Take 1 tablet (10 mg total) by mouth 3 (three) times daily as needed for muscle spasms., Disp: 60 tablet, Rfl: 2   calcium -vitamin D  (OSCAL WITH D) 500-5 MG-MCG tablet, Take 1 tablet by mouth daily with breakfast., Disp: , Rfl:    ciclopirox  (PENLAC ) 8 % solution, Apply over nail and surrounding skin daily. Apply daily over previous coat. After seven (7) days, remove with alcohol and continue cycle.; continue therapy until  nail clearance (max duration: 48 wks), Disp: 6.6 mL, Rfl: 0   docusate sodium  (COLACE) 100 MG capsule, Take 100 mg by mouth daily as needed for mild constipation., Disp: , Rfl:    FLUoxetine  (PROZAC ) 40 MG capsule, Take 1 capsule (40 mg total) by mouth daily., Disp: 90 capsule, Rfl: 3   gentamicin  ointment (GARAMYCIN ) 0.1 %, Apply to nose 3 (three) times daily., Disp: 30 g, Rfl: 12   Lactobacillus (PROBIOTIC ACIDOPHILUS PO), Take 1 capsule by mouth daily., Disp: , Rfl:    LORazepam  (ATIVAN ) 0.5 MG tablet, Take 1 tablet (0.5 mg total) by mouth 2 (two) times daily as needed for anxiety., Disp: 60 tablet, Rfl: 2   nicotine  (NICODERM CQ  - DOSED IN MG/24 HR) 7 mg/24hr patch, Place 1 patch (7 mg total) onto the skin daily., Disp: 28 patch, Rfl: 2   nicotine  polacrilex (NICORETTE ) 2 MG gum, Take 1 each (2 mg total) by mouth as needed for smoking cessation., Disp: 110 tablet, Rfl: 0   ondansetron  (ZOFRAN -ODT) 4 MG disintegrating tablet, Take 1 tablet (4 mg total) by mouth every 8 (eight) hours as needed for nausea or vomiting., Disp: 30 tablet, Rfl: 1   Oxycodone  HCl 10 MG TABS, Take 1 tablet (10mg ) by mouth once every four hours as needed for pain. Stop Percocet due to headaches., Disp: 180 tablet, Rfl: 0   Oxycodone  HCl 10 MG TABS, Take 1 tablet (10 mg total) by mouth every 4 (four) hours as needed for pain., Disp: 180 tablet, Rfl:  0   Oxycodone  HCl 10 MG TABS, Take 1 tablet (10 mg total) by mouth every 4 (four) hours as needed for pain, Disp: 18 tablet, Rfl: 0   Oxycodone  HCl 10 MG TABS, Take 1 tablet (10 mg total) by mouth every 4 (four) hours as needed for pain., Disp: 180 tablet, Rfl: 0   Oxycodone  HCl 10 MG TABS, Take 1 tablet (10 mg total) by mouth every 4 (four) hours as needed for pain, Disp: 180 tablet, Rfl: 0   Oxycodone  HCl 10 MG TABS, Take 1 tablet (10 mg total) by mouth every 4 (four) hours as needed for pain., Disp: 180 tablet, Rfl: 0   Oxycodone  HCl 10 MG TABS, Take 1 tablet (10 mg total) by mouth every 4 (four) hours as needed for pain., Disp: 180 tablet, Rfl: 0   Oxycodone  HCl 10 MG TABS, Take 1 tablet (10 mg total) by mouth every 4 (four) hours as needed., Disp: 180 tablet, Rfl: 0   Oxycodone  HCl 10 MG TABS, Take 1 tablet (10 mg total) by mouth every 4 (four) hours as needed for pain., Disp: 90 tablet, Rfl: 0   Oxycodone  HCl 10 MG TABS, Take 1 tablet (10 mg total) by mouth every 4 (four) hours as needed for pain., Disp: 180 tablet, Rfl: 0   Oxycodone  HCl 10 MG TABS, Take 1 tablet (10 mg total) by mouth every 4 (four) hours as needed for pain., Disp: 90 tablet, Rfl: 0   Oxycodone  HCl 10 MG TABS, Take 1 tablet (10 mg total) by mouth every 4 (four) hours as needed for pain., Disp: 180 tablet, Rfl: 0   Oxycodone  HCl 10 MG TABS, Take 1 tablet (10 mg total) by mouth every 4 (four) hours as needed for pain., Disp: 180 tablet, Rfl: 0   Oxycodone  HCl 10 MG TABS, Take 1 tablet (10 mg total) by mouth every 4 (four) hours as needed for pain., Disp: 180 tablet, Rfl: 0   pregabalin  (  LYRICA ) 150 MG capsule, Take 1 capsule (150 mg total) by mouth in the morning, at noon, and at bedtime., Disp: 90 capsule, Rfl: 2   terbinafine  (LAMISIL ) 1 % cream, Apply 1 Application topically 2 (two) times daily., Disp: 30 g, Rfl: 0   trimethoprim  (TRIMPEX ) 100 MG tablet, Take 1 tablet (100 mg total) by mouth daily., Disp: 90 tablet, Rfl: 3    umeclidinium-vilanterol (ANORO ELLIPTA ) 62.5-25 MCG/ACT AEPB, Inhale 1 puff into the lungs daily., Disp: 60 each, Rfl: 6   vitamin B-12 (CYANOCOBALAMIN ) 100 MCG tablet, Take 100 mcg by mouth daily. gummy, Disp: , Rfl:    acetaminophen  (TYLENOL ) 650 MG CR tablet, Take 1,300 mg by mouth every 8 (eight) hours as needed for pain. (Patient not taking: Reported on 06/27/2024), Disp: , Rfl:    ibuprofen  (ADVIL ) 400 MG tablet, Take 400 mg by mouth every 6 (six) hours as needed. (Patient not taking: Reported on 06/27/2024), Disp: , Rfl:    lidocaine -prilocaine  (EMLA ) cream, Apply topically. (Patient not taking: Reported on 06/27/2024), Disp: , Rfl:    pravastatin  (PRAVACHOL ) 10 MG tablet, Take 1 tablet (10 mg total) by mouth daily. (Patient not taking: Reported on 06/27/2024), Disp: 90 tablet, Rfl: 3 No current facility-administered medications for this visit.  Facility-Administered Medications Ordered in Other Visits:    acetaminophen  (TYLENOL ) 325 MG tablet, , , ,    diphenhydrAMINE  (BENADRYL ) 25 mg capsule, , , ,    goserelin (ZOLADEX ) injection 3.6 mg, 3.6 mg, Subcutaneous, Q28 days, Melanee Annah BROCKS, MD, 3.6 mg at 02/02/23 1206   heparin  lock flush 100 unit/mL, 500 Units, Intravenous, Once, Melanee Annah BROCKS, MD   Zoledronic  Acid (ZOMETA ) 4 MG/100ML IVPB, , , ,   Physical exam:  Vitals:   06/27/24 1320  BP: 109/77  Pulse: 73  Resp: 20  Temp: 98.7 F (37.1 C)  SpO2: 100%  Weight: 103 lb 4 oz (46.8 kg)   Physical Exam Constitutional:      Comments: Appears thin and frail  Cardiovascular:     Rate and Rhythm: Normal rate and regular rhythm.     Heart sounds: Normal heart sounds.  Pulmonary:     Effort: Pulmonary effort is normal.     Breath sounds: Normal breath sounds.  Abdominal:     General: Bowel sounds are normal.     Palpations: Abdomen is soft.  Skin:    General: Skin is warm and dry.  Neurological:     Mental Status: She is alert and oriented to person, place, and time.      I have  personally reviewed labs listed below:    Latest Ref Rng & Units 06/27/2024    2:00 PM  CMP  Glucose 70 - 99 mg/dL 886   BUN 6 - 20 mg/dL 15   Creatinine 9.55 - 1.00 mg/dL 8.76   Sodium 864 - 854 mmol/L 137   Potassium 3.5 - 5.1 mmol/L 4.1   Chloride 98 - 111 mmol/L 101   CO2 22 - 32 mmol/L 24   Calcium  8.9 - 10.3 mg/dL 88.7   Total Protein 6.5 - 8.1 g/dL 6.9   Total Bilirubin 0.0 - 1.2 mg/dL 1.2   Alkaline Phos 38 - 126 U/L 421   AST 15 - 41 U/L 219   ALT 0 - 44 U/L 71       Latest Ref Rng & Units 06/27/2024    2:00 PM  CBC  WBC 4.0 - 10.5 K/uL 7.8   Hemoglobin 12.0 -  15.0 g/dL 87.1   Hematocrit 63.9 - 46.0 % 39.8   Platelets 150 - 400 K/uL 55    I have personally reviewed Radiology images listed below: No images are attached to the encounter.  CT CHEST ABDOMEN PELVIS W CONTRAST Result Date: 06/24/2024 CLINICAL DATA:  Right chest and abdominal pain for 2-3 weeks with nausea. History of left breast cancer in 2021 with bilateral mastectomy, ultrasound 06/20/2024 showed solid lesions throughout the liver compatible with metastatic disease. Restaging assessment. * Tracking Code: BO * EXAM: CT CHEST, ABDOMEN, AND PELVIS WITH CONTRAST TECHNIQUE: Multidetector CT imaging of the chest, abdomen and pelvis was performed following the standard protocol during bolus administration of intravenous contrast. RADIATION DOSE REDUCTION: This exam was performed according to the departmental dose-optimization program which includes automated exposure control, adjustment of the mA and/or kV according to patient size and/or use of iterative reconstruction technique. CONTRAST:  80mL OMNIPAQUE  IOHEXOL  300 MG/ML  SOLN COMPARISON:  Ultrasound 06/20/2024 and CT scans from 08/31/2023 and 08/21/2023 FINDINGS: CT CHEST FINDINGS Cardiovascular: Mild atheromatous vascular disease of the thoracic aorta Mediastinum/Nodes: Right paratracheal node 1.0 cm in short axis on image 18 series 2, formerly 1.2 cm. Lungs/Pleura:  Scattered hazy and reticular opacities in the lungs, right greater than left, with new scattered faint and mostly ill-defined and subsolid nodularity in the lungs. Most of this is at or below 5 mm in diameter Ill-defined part solid left upper lobe nodule measuring 1.0 by 0.8 cm with the more solid component measuring 0.7 by 0.4 cm, image 39 series 3. Index right lower lobe nodule 0.5 by 0.4 cm on image 91 series 3. Subpleural reticulation anteriorly in the lungs, left greater than right, with prior radiation therapy. Underlying emphysema. Musculoskeletal: Widespread irregular bony heterogeneity in the ribs, sternum, and thoracic spine suspicious for widespread osseous metastatic disease. This is new compared to 08/21/2023. CT ABDOMEN PELVIS FINDINGS Hepatobiliary: As shown on the ultrasound from 06/20/2024, there is widespread heavy burden of targetoid enhancing lesions throughout all segments of the liver compatible with extensive hepatic metastatic disease. Index lesion in the medial segment left hepatic lobe measures 4.0 by 3.1 cm on image 55 series 2. Gallbladder unremarkable. Pancreas: Unremarkable Spleen: 0.7 cm hypodense lesion anteriorly in the spleen on image 54 series 2 is new compared to the prior exam. Metastatic lesion not excluded Adrenals/Urinary Tract: Unremarkable Stomach/Bowel: Sigmoid and distal descending colon diverticulosis. No findings of active diverticulitis. Vascular/Lymphatic: Atherosclerosis is present, including aortoiliac atherosclerotic disease. Reproductive: Uterus absent.  Adnexa unremarkable. Other: No supplemental non-categorized findings. Musculoskeletal: Dextroconvex thoracolumbar scoliosis with rotary component. Widespread bony heterogeneity compatible with extensive osseous metastatic disease, new when compared to 08/31/2023. IMPRESSION: 1. Widespread hepatic and osseous metastatic disease, new compared to 08/31/2023 and 08/21/2023. 2. New scattered faint and mostly ill-defined  and subsolid nodularity in the lungs, right greater than left, with new scattered hazy and reticular opacities in the lungs, right greater than left. This could be from atypical infection or inflammation, but metastatic disease is not excluded. 3. New 0.7 cm hypodense lesion anteriorly in the spleen, metastatic lesion not excluded. 4. Sigmoid and distal descending colon diverticulosis. 5. Dextroconvex thoracolumbar scoliosis with rotary component. 6.  Aortic Atherosclerosis (ICD10-I70.0). Electronically Signed   By: Ryan Salvage M.D.   On: 06/24/2024 16:02   US  Abdomen Limited RUQ (LIVER/GB) Result Date: 06/20/2024 CLINICAL DATA:  53 year old female with elevated liver enzymes. Brain MRI last month states metastatic right breast cancer. EXAM: ULTRASOUND ABDOMEN LIMITED RIGHT UPPER QUADRANT COMPARISON:  CT Abdomen and Pelvis 08/31/2023. FINDINGS: Gallbladder: No gallstones or wall thickening visualized. No sonographic Murphy sign noted by sonographer. Common bile duct: Diameter: 6 mm, upper limits of normal. Liver: Innumerable hypoechoic and rounded liver lesions (images 17 and 18), were not apparent by CT last year. Underlying chronic hepatic steatosis. Individual liver masses up to 5.3 cm in the right lobe, 3.8 cm in the left. Portal vein is patent on color Doppler imaging with normal direction of blood flow towards the liver. Other: Negative visible right kidney.  No free fluid. IMPRESSION: 1. Extensive Liver Metastases.  No right upper quadrant free fluid. 2. Negative gallbladder. No evidence of bile duct obstruction. These results will be called to the ordering clinician or representative by the Radiologist Assistant, and communication documented in the PACS or [employer]. Electronically Signed   By: VEAR Hurst M.D.   On: 06/20/2024 12:18     Assessment and plan- Patient is a 53 y.o. female with history of stage III left breast cancer ER/PR positive and mixed pathology of HER2 positive and HER2  negative now presenting with metastatic disease  I have reviewed CT chest abdomen pelvis images independently and discussed findings with the patient unfortunately shows evidence of extensive metastatic disease especially in her liver.  There are also extensive areas of new metastatic disease noted in her bones.  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  Patient has heterogeneous disease and she was noted to have HER2 positive disease in the breast and HER2 negative disease in her lymph nodes after receiving neoadjuvant chemotherapy for stage III breast cancer that was ER/PR positive and HER2 negative.  Based on the receptor status we will have to decide what kind of treatment we can offer her.  I am checking labs today including CBC with differential CMP CA 27-29 and CA 15-3.  I will also get a bone scan ASAP.  CBC especially shows evidence of nucleated red blood cells in her peripheral smear and a platelet count of 55 Which is concerning for bone marrow involvement with malignancy.  Neoplasm related pain: She is on as neededOxycodone and I will plan to send her a prescription for fentanyl  patch 12 mcg as a long-acting pain medicine as well.   Visit Diagnosis 1. Malignant neoplasm of upper-inner quadrant of left female breast, unspecified estrogen receptor status (HCC)   2. Metastasis to liver (HCC)   3. Goals of care, counseling/discussion   4. Abnormal LFTs   5. Neoplasm related pain      Dr. Annah Skene, MD, MPH [facility] [phone] 06/28/2024 2:34 PM

## 2024-06-30 ENCOUNTER — Other Ambulatory Visit: Payer: Self-pay | Admitting: Radiology

## 2024-06-30 ENCOUNTER — Telehealth: Payer: Self-pay

## 2024-06-30 ENCOUNTER — Telehealth: Payer: Self-pay | Admitting: *Deleted

## 2024-06-30 DIAGNOSIS — K769 Liver disease, unspecified: Secondary | ICD-10-CM

## 2024-06-30 NOTE — Telephone Encounter (Signed)
 Dr. Melanee would like to move US  liver biopsy originally scheduled for 07/07/24.  Per Clarita Ricker GM - I am able to move this patient to tomorrow at 10a an arrive at 9a. The patient that we had on was admitted and will not be having her procedure. Please let me know if this works for the patient. Thanks.  Per Clarita Ricker No - NPO after MN and a driver but she should arrive at the [facility] entrance . Outbound call to patient; informed of above.  Patient verbalized understanding.

## 2024-06-30 NOTE — Telephone Encounter (Signed)
 Call returned to caregiver that states patient had liver biopsy scheduled for tomorrow morning and was unsure what medications patient should or should not take.  Caregiver states patient was to start fentanyl  patch today.  RN stated caregiver could apply fentanyl  patch today.  If patient pain was not controlled by patch tomorrow morning then patient could take pain medication with a sip of water.  Instructed other meds could be taken once patient returned home. Caregiver verbalized understanding.

## 2024-06-30 NOTE — H&P (Signed)
 Chief Complaint: Patient was seen in consultation today for extensive liver lesions in the setting of metastatic left breast cancer, with consideration for biopsy.  Referring Provider(s): Dr. Cindy Joe, MD  Supervising Physician: Karalee Beat  Patient Status: [facility] - Out-pt  Patient is Full Code  History of Present Illness: Tricia Berry is a 53 y.o. female  with PMHx notable for metastatic left breast cancer stage III, and others as delineated below.  Per Dr. Darold progress note on 9/5: I have reviewed CT chest abdomen pelvis images independently and discussed findings with the patient unfortunately shows evidence of extensive metastatic disease especially in her liver.  There are also extensive areas of new metastatic disease noted in her bones.  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  Patient has heterogeneous disease and she was noted to have HER2 positive disease in the breast and HER2 negative disease in her lymph nodes after receiving neoadjuvant chemotherapy for stage III breast cancer that was ER/PR positive and HER2 negative.  Based on the receptor status we will have to decide what kind of treatment we can offer her.  Interventional Radiology was requested for liver lesion biopsy. Request was reviewed and approved by Dr. Jenna. Patient is scheduled for same in IR today.   Patient is alert and laying in bed, calm. Her husband is at her side. Patient is currently complaining of baseline dizziness and nausea, as well as intermittent generalized abdominal discomfort, and back pain. Patient denies any fevers, headache, chest pain, SOB, cough, abdominal pain, nausea, vomiting or bleeding.     Past Medical History:  Diagnosis Date   Anemia    Anginal pain (HCC)    excertion not cardiac  related per patient   Anxiety    Breast cancer (HCC) 12/2021   left breast IMC   Diverticulitis    Dyspnea    Dysrhythmia    prolong QT   Family history of ovarian cancer    GERD (gastroesophageal reflux disease)    Headache    IBS (irritable bowel syndrome)    Neuromuscular disorder (HCC)    neuropathy from chemo   Pneumonia    PONV (postoperative nausea and vomiting)    severe migraine and vomiting post anesthesia   Scoliosis     Past Surgical History:  Procedure Laterality Date   ABDOMINAL HYSTERECTOMY     still has ovaries, no gyn cancer, hysterectomy due to endometriosis. NO cervix on exam 01/10/21   BACK SURGERY     BREAST BIOPSY Left 09/15/2020   us  bx of mass, path pending, Q marker   BREAST BIOPSY Left 09/15/2020   us  bx of LN, hydromarker, path pending   BREAST RECONSTRUCTION WITH PLACEMENT OF TISSUE EXPANDER AND FLEX HD (ACELLULAR HYDRATED DERMIS) Bilateral 03/17/2021   Procedure: IMMEDIATE BILATERAL BREAST RECONSTRUCTION WITH PLACEMENT OF TISSUE EXPANDER AND FLEX HD (ACELLULAR HYDRATED DERMIS);  Surgeon: Lowery Estefana RAMAN, DO;  Location: [facility];  Service: Plastics;  Laterality: Bilateral;   CAPSULECTOMY Right 10/18/2022   Procedure: Right breast capsule release with adjustment;  Surgeon: Lowery Estefana RAMAN, DO;  Location:  SURGERY CENTER;  Service: Plastics;  Laterality: Right;   COLONOSCOPY WITH PROPOFOL  N/A 11/21/2021   Procedure: COLONOSCOPY WITH PROPOFOL ;  Surgeon: Unk Corinn Skiff, MD;  Location: [facility]  ENDOSCOPY;  Service: Gastroenterology;  Laterality: N/A;   FLEXIBLE BRONCHOSCOPY Bilateral 12/01/2022   Procedure: FLEXIBLE BRONCHOSCOPY;  Surgeon: Isadora Hose, MD;  Location: [facility]S;  Service: Pulmonary;  Laterality: Bilateral;   HAND SURGERY Right 10/2023   IR IMAGING GUIDED PORT INSERTION  10/01/2020   MODIFIED MASTECTOMY Left 03/17/2021   Procedure: LEFT MODIFIED RADICAL MASTECTOMY;  Surgeon: Vanderbilt Ned, MD;   Location: [facility];  Service: General;  Laterality: Left;   PORTA CATH REMOVAL Right 03/17/2021   Procedure: PORTA CATH REMOVAL;  Surgeon: Vanderbilt Ned, MD;  Location: [facility];  Service: General;  Laterality: Right;   REMOVAL OF BILATERAL TISSUE EXPANDERS WITH PLACEMENT OF BILATERAL BREAST IMPLANTS Bilateral 05/09/2021   Procedure: REMOVAL OF BILATERAL TISSUE EXPANDERS WITH PLACEMENT OF BILATERAL BREAST IMPLANTS;  Surgeon: Lowery Estefana RAMAN, DO;  Location: [facility];  Service: Plastics;  Laterality: Bilateral;  90 min   saginal band repair and capsule release  10/2023   TOTAL MASTECTOMY Right 03/17/2021   Procedure: TOTAL MASTECTOMY;  Surgeon: Vanderbilt Ned, MD;  Location: [facility];  Service: General;  Laterality: Right;    Allergies: Morphine, Sertraline hcl, Sulfa antibiotics, Sulfamethoxazole, and Sulfonamide derivatives  Medications: Prior to Admission medications   Medication Sig Start Date End Date Taking? Authorizing Provider  acetaminophen  (TYLENOL ) 650 MG CR tablet Take 1,300 mg by mouth every 8 (eight) hours as needed for pain. Patient not taking: Reported on 06/27/2024    [provider]  albuterol  (PROVENTIL ) (2.5 MG/3ML) 0.083% nebulizer solution Take 3 mLs (2.5 mg total) by nebulization every 6 (six) hours as needed for wheezing or shortness of breath. 11/23/22   Josette Ade, MD  albuterol  (VENTOLIN  HFA) 108 (90 Base) MCG/ACT inhaler Inhale 2 puffs into the lungs every 6 (six) hours as needed for wheezing or shortness of breath. 12/18/22   Dineen Rollene MATSU, FNP  anastrozole  (ARIMIDEX ) 1 MG tablet Take 1 tablet (1 mg total) by mouth daily. 03/25/24   Melanee Annah BROCKS, MD  b complex vitamins capsule Take 1 capsule by mouth daily.    [provider]  baclofen  (LIORESAL ) 10 MG tablet Take 1 tablet (10 mg total) by mouth 3 (three) times daily as needed for muscle spasms. 08/20/23   Vaslow,  Zachary K, MD  calcium -vitamin D  (OSCAL WITH D) 500-5 MG-MCG tablet Take 1 tablet by mouth daily with breakfast.    [provider]  ciclopirox  (PENLAC ) 8 % solution Apply over nail and surrounding skin daily. Apply daily over previous coat. After seven (7) days, remove with alcohol and continue cycle.; continue therapy until nail clearance (max duration: 48 wks) 06/19/24   Dineen Rollene MATSU, FNP  docusate sodium  (COLACE) 100 MG capsule Take 100 mg by mouth daily as needed for mild constipation.    [provider]  fentaNYL  (DURAGESIC ) 12 MCG/HR Place 1 patch onto the skin every 3 (three) days. 06/28/24   Melanee Annah BROCKS, MD  FLUoxetine  (PROZAC ) 40 MG capsule Take 1 capsule (40 mg total) by mouth daily. 11/23/23   Dineen Rollene MATSU, FNP  gentamicin  ointment (GARAMYCIN ) 0.1 % Apply to nose 3 (three) times daily. 01/18/23     ibuprofen  (ADVIL ) 400 MG tablet Take 400 mg by mouth every 6 (six) hours as needed. Patient not taking: Reported on 06/27/2024    [provider]  Lactobacillus (PROBIOTIC ACIDOPHILUS PO) Take 1 capsule by mouth daily.    [provider]  lidocaine -prilocaine  (EMLA ) cream Apply  topically. Patient not taking: Reported on 06/27/2024 06/10/21   [provider]  LORazepam  (ATIVAN ) 0.5 MG tablet Take 1 tablet (0.5 mg total) by mouth 2 (two) times daily as needed for anxiety. 05/29/24   Dineen Rollene MATSU, FNP  nicotine  (NICODERM CQ  - DOSED IN MG/24 HR) 7 mg/24hr patch Place 1 patch (7 mg total) onto the skin daily. 05/26/24   Dineen Rollene MATSU, FNP  nicotine  polacrilex (NICORETTE ) 2 MG gum Take 1 each (2 mg total) by mouth as needed for smoking cessation. 02/20/24   Dineen Rollene MATSU, FNP  ondansetron  (ZOFRAN -ODT) 4 MG disintegrating tablet Take 1 tablet (4 mg total) by mouth every 8 (eight) hours as needed for nausea or vomiting. 06/20/24   Dineen Rollene MATSU, FNP  Oxycodone  HCl 10 MG TABS Take 1 tablet (10mg ) by mouth once every four hours as needed  for pain. Stop Percocet due to headaches. 08/08/23     Oxycodone  HCl 10 MG TABS Take 1 tablet (10 mg total) by mouth every 4 (four) hours as needed for pain. 09/06/23     Oxycodone  HCl 10 MG TABS Take 1 tablet (10 mg total) by mouth every 4 (four) hours as needed for pain 11/05/23     Oxycodone  HCl 10 MG TABS Take 1 tablet (10 mg total) by mouth every 4 (four) hours as needed for pain. 12/06/23     Oxycodone  HCl 10 MG TABS Take 1 tablet (10 mg total) by mouth every 4 (four) hours as needed for pain 11/07/23     Oxycodone  HCl 10 MG TABS Take 1 tablet (10 mg total) by mouth every 4 (four) hours as needed for pain. 12/06/23     Oxycodone  HCl 10 MG TABS Take 1 tablet (10 mg total) by mouth every 4 (four) hours as needed for pain. 02/28/24     Oxycodone  HCl 10 MG TABS Take 1 tablet (10 mg total) by mouth every 4 (four) hours as needed. 01/31/24     Oxycodone  HCl 10 MG TABS Take 1 tablet (10 mg total) by mouth every 4 (four) hours as needed for pain. 02/27/24     Oxycodone  HCl 10 MG TABS Take 1 tablet (10 mg total) by mouth every 4 (four) hours as needed for pain. 03/12/24     Oxycodone  HCl 10 MG TABS Take 1 tablet (10 mg total) by mouth every 4 (four) hours as needed for pain. 04/10/24     Oxycodone  HCl 10 MG TABS Take 1 tablet (10 mg total) by mouth every 4 (four) hours as needed for pain. 04/10/24     Oxycodone  HCl 10 MG TABS Take 1 tablet (10 mg total) by mouth every 4 (four) hours as needed for pain. 06/20/24     Oxycodone  HCl 10 MG TABS Take 1 tablet (10 mg total) by mouth every 4 (four) hours as needed for pain. 05/22/24     pravastatin  (PRAVACHOL ) 10 MG tablet Take 1 tablet (10 mg total) by mouth daily. Patient not taking: Reported on 06/27/2024 02/20/24   Dineen Rollene MATSU, FNP  pregabalin  (LYRICA ) 150 MG capsule Take 1 capsule (150 mg total) by mouth in the morning, at noon, and at bedtime. 05/29/24   Dineen Rollene MATSU, FNP  terbinafine  (LAMISIL ) 1 % cream Apply 1 Application topically 2 (two) times daily.  03/25/24   Melanee Annah BROCKS, MD  trimethoprim  (TRIMPEX ) 100 MG tablet Take 1 tablet (100 mg total) by mouth daily. 07/03/23   McGowan, Clotilda LABOR, PA-C  umeclidinium-vilanterol [facility]  ELLIPTA) 62.5-25 MCG/ACT AEPB Inhale 1 puff into the lungs daily. 01/02/24   Isadora Hose, MD  vitamin B-12 (CYANOCOBALAMIN ) 100 MCG tablet Take 100 mcg by mouth daily. gummy    [provider]     Family History  Problem Relation Age of Onset   Hypertension Mother    Osteoarthritis Mother    Diverticulitis Mother    Heart failure Father    Hypertension Father    Gout Father    Heart attack Father 57   Diverticulitis Brother    Ovarian cancer Paternal Grandmother     Social History   Socioeconomic History   Marital status: Significant Other    Spouse name: Not on file   Number of children: 2   Years of education: Not on file   Highest education level: Not on file  Occupational History   Occupation: Nurse    Employer: [employer]  Tobacco Use   Smoking status: Every Day    Current packs/day: 1.00    Average packs/day: 1 pack/day for 20.0 years (20.0 ttl pk-yrs)    Types: Cigarettes   Smokeless tobacco: Never   Tobacco comments:    1-2 cigarettes weekly- 09/12/2023  Vaping Use   Vaping status: Never Used  Substance and Sexual Activity   Alcohol use: Not Currently   Drug use: No   Sexual activity: Not Currently    Birth control/protection: Surgical    Comment: hyst  Other Topics Concern   Not on file  Social History Narrative   Patient works as an Insurance underwriter at [employer]. She has 2 children at home who have special needs. She and her spouse are primary caregivers.    Social Drivers of Corporate investment banker Strain: Not on file  Food Insecurity: No Food Insecurity (03/21/2023)   Hunger Vital Sign    Worried About Running Out of Food in the Last Year: Never true    Ran Out of Food in the Last Year: Never true  Transportation Needs: No Transportation Needs (03/21/2023)   PRAPARE -  Administrator, Civil Service (Medical): No    Lack of Transportation (Non-Medical): No  Physical Activity: Not on file  Stress: Not on file  Social Connections: Not on file     Review of Systems: A 12 point ROS discussed and pertinent positives are indicated in the HPI above.  All other systems are negative.  Vital Signs: BP 100/64   Pulse 68   Temp 98.4 F (36.9 C) (Tympanic)   Resp 12   Ht 5' 2 (1.575 m)   Wt 103 lb 2.8 oz (46.8 kg)   SpO2 96%   BMI 18.87 kg/m   Advance Care Plan: The advanced care place/surrogate decision maker was discussed at the time of visit and the patient did not wish to discuss or was not able to name a surrogate decision maker or provide an advance care plan.  Physical Exam Constitutional:      General: She is not in acute distress.    Appearance: Normal appearance.     Comments: Baseline dizziness and nausea.  HENT:     Mouth/Throat:     Mouth: Mucous membranes are dry.  Cardiovascular:     Rate and Rhythm: Normal rate and regular rhythm.     Pulses: Normal pulses.     Heart sounds: Normal heart sounds.  Pulmonary:     Effort: Pulmonary effort is normal.     Breath sounds: Normal breath sounds.  Abdominal:     General: Abdomen is flat.     Palpations: Abdomen is soft.     Tenderness: There is abdominal tenderness.     Comments: Generalized tenderness to palpation, worst at left side.  Musculoskeletal:        General: Normal range of motion.     Cervical back: Normal range of motion.  Skin:    General: Skin is warm and dry.  Neurological:     Mental Status: She is alert and oriented to person, place, and time.  Psychiatric:        Mood and Affect: Mood normal.        Behavior: Behavior normal.        Thought Content: Thought content normal.        Judgment: Judgment normal.     Imaging: CT CHEST ABDOMEN PELVIS W CONTRAST Result Date: 06/24/2024 CLINICAL DATA:  Right chest and abdominal pain for 2-3 weeks with  nausea. History of left breast cancer in 2021 with bilateral mastectomy, ultrasound 06/20/2024 showed solid lesions throughout the liver compatible with metastatic disease. Restaging assessment. * Tracking Code: BO * EXAM: CT CHEST, ABDOMEN, AND PELVIS WITH CONTRAST TECHNIQUE: Multidetector CT imaging of the chest, abdomen and pelvis was performed following the standard protocol during bolus administration of intravenous contrast. RADIATION DOSE REDUCTION: This exam was performed according to the departmental dose-optimization program which includes automated exposure control, adjustment of the mA and/or kV according to patient size and/or use of iterative reconstruction technique. CONTRAST:  80mL OMNIPAQUE  IOHEXOL  300 MG/ML  SOLN COMPARISON:  Ultrasound 06/20/2024 and CT scans from 08/31/2023 and 08/21/2023 FINDINGS: CT CHEST FINDINGS Cardiovascular: Mild atheromatous vascular disease of the thoracic aorta Mediastinum/Nodes: Right paratracheal node 1.0 cm in short axis on image 18 series 2, formerly 1.2 cm. Lungs/Pleura: Scattered hazy and reticular opacities in the lungs, right greater than left, with new scattered faint and mostly ill-defined and subsolid nodularity in the lungs. Most of this is at or below 5 mm in diameter Ill-defined part solid left upper lobe nodule measuring 1.0 by 0.8 cm with the more solid component measuring 0.7 by 0.4 cm, image 39 series 3. Index right lower lobe nodule 0.5 by 0.4 cm on image 91 series 3. Subpleural reticulation anteriorly in the lungs, left greater than right, with prior radiation therapy. Underlying emphysema. Musculoskeletal: Widespread irregular bony heterogeneity in the ribs, sternum, and thoracic spine suspicious for widespread osseous metastatic disease. This is new compared to 08/21/2023. CT ABDOMEN PELVIS FINDINGS Hepatobiliary: As shown on the ultrasound from 06/20/2024, there is widespread heavy burden of targetoid enhancing lesions throughout all segments of the  liver compatible with extensive hepatic metastatic disease. Index lesion in the medial segment left hepatic lobe measures 4.0 by 3.1 cm on image 55 series 2. Gallbladder unremarkable. Pancreas: Unremarkable Spleen: 0.7 cm hypodense lesion anteriorly in the spleen on image 54 series 2 is new compared to the prior exam. Metastatic lesion not excluded Adrenals/Urinary Tract: Unremarkable Stomach/Bowel: Sigmoid and distal descending colon diverticulosis. No findings of active diverticulitis. Vascular/Lymphatic: Atherosclerosis is present, including aortoiliac atherosclerotic disease. Reproductive: Uterus absent.  Adnexa unremarkable. Other: No supplemental non-categorized findings. Musculoskeletal: Dextroconvex thoracolumbar scoliosis with rotary component. Widespread bony heterogeneity compatible with extensive osseous metastatic disease, new when compared to 08/31/2023. IMPRESSION: 1. Widespread hepatic and osseous metastatic disease, new compared to 08/31/2023 and 08/21/2023. 2. New scattered faint and mostly ill-defined and subsolid nodularity in the lungs, right greater than left, with new scattered hazy and reticular  opacities in the lungs, right greater than left. This could be from atypical infection or inflammation, but metastatic disease is not excluded. 3. New 0.7 cm hypodense lesion anteriorly in the spleen, metastatic lesion not excluded. 4. Sigmoid and distal descending colon diverticulosis. 5. Dextroconvex thoracolumbar scoliosis with rotary component. 6.  Aortic Atherosclerosis (ICD10-I70.0). Electronically Signed   By: Ryan Salvage M.D.   On: 06/24/2024 16:02   US  Abdomen Limited RUQ (LIVER/GB) Result Date: 06/20/2024 CLINICAL DATA:  53 year old female with elevated liver enzymes. Brain MRI last month states metastatic right breast cancer. EXAM: ULTRASOUND ABDOMEN LIMITED RIGHT UPPER QUADRANT COMPARISON:  CT Abdomen and Pelvis 08/31/2023. FINDINGS: Gallbladder: No gallstones or wall thickening  visualized. No sonographic Murphy sign noted by sonographer. Common bile duct: Diameter: 6 mm, upper limits of normal. Liver: Innumerable hypoechoic and rounded liver lesions (images 17 and 18), were not apparent by CT last year. Underlying chronic hepatic steatosis. Individual liver masses up to 5.3 cm in the right lobe, 3.8 cm in the left. Portal vein is patent on color Doppler imaging with normal direction of blood flow towards the liver. Other: Negative visible right kidney.  No free fluid. IMPRESSION: 1. Extensive Liver Metastases.  No right upper quadrant free fluid. 2. Negative gallbladder. No evidence of bile duct obstruction. These results will be called to the ordering clinician or representative by the Radiologist Assistant, and communication documented in the PACS or [employer]. Electronically Signed   By: VEAR Hurst M.D.   On: 06/20/2024 12:18    Labs:  CBC: Recent Labs    05/21/24 1043 06/10/24 1034 06/27/24 1400  WBC 9.8 8.3 7.8  HGB 14.6 13.6 12.8  HCT 45.3 41.5 39.8  PLT 149.0* 83.0* 55*    COAGS: Recent Labs    06/19/24 1406  INR 1.1    BMP: Recent Labs    05/21/24 1043 06/27/24 1400  NA 141 137  K 4.6 4.1  CL 104 101  CO2 28 24  GLUCOSE 80 113*  BUN 11 15  CALCIUM  9.6 11.2*  CREATININE 0.98 1.23*  GFRNONAA  --  52*    LIVER FUNCTION TESTS: Recent Labs    05/21/24 1043 06/10/24 1034 06/27/24 1400  BILITOT 0.5 1.0 1.2  AST 134* 179* 219*  ALT 80* 80* 71*  ALKPHOS 294* 399*  331* 421*  PROT 6.4 6.7 6.9  ALBUMIN 4.0 3.9 3.5    TUMOR MARKERS: No results for input(s): AFPTM, CEA, CA199, CHROMGRNA in the last 8760 hours.  Assessment and Plan: Per Dr. Darold progress note on 9/5: I have reviewed CT chest abdomen pelvis images independently and discussed findings with the patient unfortunately shows evidence of extensive metastatic disease especially in her liver.  There are also extensive areas of new metastatic disease noted in her  bones.  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  Patient has heterogeneous disease and she was noted to have HER2 positive disease in the breast and HER2 negative disease in her lymph nodes after receiving neoadjuvant chemotherapy for stage III breast cancer that was ER/PR positive and HER2 negative.  Based on the receptor status we will have to decide what kind of treatment we can offer her.  Patient presents for scheduled liver lesion biopsy in IR today.  Patient has been NPO since midnight.  All labs and medications are within acceptable parameters.  Patient has an allergy to Morphine and sulfa medications. She is prescribed a fentanyl  patch, which she has opted not to wear today.  Risks and benefits of liver lesion biopsy was discussed with the patient and/or patient's family including, but not limited to bleeding, infection, damage to adjacent structures or low yield requiring additional tests.  All of the questions were answered and there is agreement to proceed.  Consent signed and in chart.    Thank you for allowing our service to participate in NATHAN STALLWORTH 's care.  Electronically Signed: Carlin DELENA Griffon, PA-C   07/01/2024, 10:11 AM      I spent a total of 30 Minutes in face to face in clinical consultation, greater than 50% of which was counseling/coordinating care for extensive liver lesions in the setting of metastatic left breast cancer, with consideration for biopsy.

## 2024-06-30 NOTE — Progress Notes (Signed)
 Patient for US  guided Liver Biopsy on Tues 07/01/24, I called and spoke with the patient on the phone and gave pre-procedure instructions. Pt was made aware to be here at 9a, NPO after MN prior to procedure as well as driver post procedure/recovery/discharge. Pt stated understanding.  Called 06/30/24

## 2024-07-01 ENCOUNTER — Ambulatory Visit
Admission: RE | Admit: 2024-07-01 | Discharge: 2024-07-01 | Disposition: A | Discharge status: Home / Self Care | Source: Ambulatory Visit | Attending: Internal Medicine | Admitting: Internal Medicine

## 2024-07-01 ENCOUNTER — Telehealth: Payer: Self-pay

## 2024-07-01 DIAGNOSIS — C50212 Malignant neoplasm of upper-inner quadrant of left female breast: Secondary | ICD-10-CM | POA: Diagnosis present

## 2024-07-01 DIAGNOSIS — K769 Liver disease, unspecified: Secondary | ICD-10-CM | POA: Diagnosis not present

## 2024-07-01 DIAGNOSIS — C787 Secondary malignant neoplasm of liver and intrahepatic bile duct: Secondary | ICD-10-CM | POA: Diagnosis not present

## 2024-07-01 LAB — PROTIME-INR
INR: 1.2 (ref 0.8–1.2)
Prothrombin Time: 16 s — ABNORMAL HIGH (ref 11.4–15.2)

## 2024-07-01 LAB — CBC
HCT: 37.4 % (ref 36.0–46.0)
Hemoglobin: 11.9 g/dL — ABNORMAL LOW (ref 12.0–15.0)
MCH: 28.9 pg (ref 26.0–34.0)
MCHC: 31.8 g/dL (ref 30.0–36.0)
MCV: 90.8 fL (ref 80.0–100.0)
Platelets: 48 K/uL — ABNORMAL LOW (ref 150–400)
RBC: 4.12 MIL/uL (ref 3.87–5.11)
RDW: 21.2 % — ABNORMAL HIGH (ref 11.5–15.5)
WBC: 6.3 K/uL (ref 4.0–10.5)
nRBC: 5.3 % — ABNORMAL HIGH (ref 0.0–0.2)

## 2024-07-01 MED ORDER — OXYCODONE HCL 5 MG PO TABS
ORAL_TABLET | ORAL | Status: AC
  Filled 2024-07-01: qty 1

## 2024-07-01 MED ORDER — HYDROMORPHONE HCL 1 MG/ML IJ SOLN: 0.5000 mg | Freq: Once | INTRAMUSCULAR | Status: DC

## 2024-07-01 MED ORDER — FENTANYL CITRATE (PF) 100 MCG/2ML IJ SOLN
INTRAMUSCULAR | Status: AC | PRN
  Administered 2024-07-01: 50 ug via INTRAVENOUS

## 2024-07-01 MED ORDER — MIDAZOLAM HCL 2 MG/2ML IJ SOLN
INTRAMUSCULAR | Status: AC | PRN
  Administered 2024-07-01: .5 mg via INTRAVENOUS

## 2024-07-01 MED ORDER — MIDAZOLAM HCL 2 MG/2ML IJ SOLN
INTRAMUSCULAR | Status: AC | PRN
  Administered 2024-07-01: .5 mg via INTRAVENOUS
  Administered 2024-07-01: 1 mg via INTRAVENOUS

## 2024-07-01 MED ORDER — SODIUM CHLORIDE 0.9 % IV SOLN: INTRAVENOUS | Status: DC

## 2024-07-01 MED ORDER — FENTANYL CITRATE (PF) 100 MCG/2ML IJ SOLN
INTRAMUSCULAR | Status: AC
  Filled 2024-07-01: qty 2

## 2024-07-01 MED ORDER — ACETAMINOPHEN 10 MG/ML IV SOLN
INTRAVENOUS | Status: AC
Start: 2024-07-01 — End: 2024-07-01
  Filled 2024-07-01: qty 100

## 2024-07-01 MED ORDER — OXYCODONE HCL 5 MG PO TABS
5.0000 mg | ORAL_TABLET | Freq: Once | ORAL | Status: AC
  Administered 2024-07-01: 5 mg via ORAL

## 2024-07-01 MED ORDER — MIDAZOLAM HCL 2 MG/2ML IJ SOLN
INTRAMUSCULAR | Status: AC
  Filled 2024-07-01: qty 2

## 2024-07-01 MED ORDER — ONDANSETRON HCL 4 MG/2ML IJ SOLN
INTRAMUSCULAR | Status: AC
  Filled 2024-07-01: qty 2

## 2024-07-01 MED ORDER — ONDANSETRON HCL 4 MG/2ML IJ SOLN
INTRAMUSCULAR | Status: AC | PRN
  Administered 2024-07-01: 4 mg via INTRAVENOUS

## 2024-07-01 MED ORDER — LIDOCAINE HCL (PF) 1 % IJ SOLN
5.0000 mL | Freq: Once | INTRAMUSCULAR | Status: AC
Start: 2024-07-01 — End: 2024-07-01
  Administered 2024-07-01: 5 mL via INTRADERMAL
  Filled 2024-07-01: qty 5

## 2024-07-01 MED ORDER — ACETAMINOPHEN 10 MG/ML IV SOLN
1000.0000 mg | INTRAVENOUS | Status: AC
  Administered 2024-07-01: 1000 mg via INTRAVENOUS

## 2024-07-01 NOTE — Progress Notes (Signed)
 Patient clinically stable post US  Liver biopsy per Dr Karalee, tolerated well. Vitals stable pre and post procedure.  Received Versed  2 mg along with Fentanyl  100 mcg IV for procedure. Report given to Rutha Stacks Rn post procedure/specials./18 SO at bedside with update given. Denies complaints pain immediately post procedure.

## 2024-07-01 NOTE — Telephone Encounter (Signed)
 Clinical Social Work received a return call from [employer], patient's significant other.  CSW attempted to contact patient by phone.  Left voicemail with contact information and request for return call.

## 2024-07-01 NOTE — Discharge Instructions (Signed)
 Liver Biopsy, Care After These instructions give you information about how to care for yourself after your procedure. Your health care provider may also give you more specific instructions. If you have problems or questions, contact your health care provider. What can I expect after the procedure? After your procedure, it is common to have: Pain and soreness in the area where the biopsy was done. Bruising around the area where the biopsy was done. Sleepiness and fatigue for 1-2 days. Follow these instructions at home: Medicines Take over-the-counter and prescription medicines only as told by your health care provider. If you were prescribed an antibiotic medicine, take it as told by your health care provider. Do not stop taking the antibiotic even if you start to feel better. Do not take medicines such as aspirin and ibuprofen unless your health care provider tells you to take them. These medicines thin your blood and can increase the risk of bleeding. If you are taking prescription pain medicine, take actions to prevent or treat constipation. Your health care provider may recommend that you: Drink enough fluid to keep your urine pale yellow. Eat foods that are high in fiber, such as fresh fruits and vegetables, whole grains, and beans. Limit foods that are high in fat and processed sugars, such as fried or sweet foods. Take an over-the-counter or prescription medicine for constipation. Incision care Wash your hands with soap and water before you change your bandage (dressing). If soap and water are not available, use hand sanitizer. Change your bandage tomorrow after you shower and then remove the next day. Check your incision area every day for signs of infection. Check for: Redness, swelling, or pain. Fluid or blood. Warmth. Pus or a bad smell. You may shower tomorrow No lifting more than 5 lbs for 3 days Return to your normal activities as told by your health care provider.  Do not  drive or use heavy machinery for 24hrs or while taking prescription pain medicine. Do not play contact sports for 2 weeks after the procedure. General instructions  Do not drink alcohol in the first week after the procedure. Have someone stay with you for at least 24 hours after the procedure. It is your responsibility to obtain your test results. Ask your health care provider, or the department that is doing the test: When will my results be ready? How will I get my results? What are my treatment options? What other tests do I need? What are my next steps? Keep all follow-up visits as told by your health care provider. This is important. Contact a health care provider if: You have increased bleeding from an incision, resulting in more than a small spot of blood. You have redness, swelling, or increasing pain in any incisions. You notice a discharge or a bad smell coming from any of your incisions. You have a fever or chills. Get help right away if: You develop swelling, bloating, or pain in your abdomen. You become dizzy or faint. You develop a rash. You have nausea or you vomit. You faint, or you have shortness of breath or difficulty breathing. You develop chest pain. You have problems with your speech or vision. You have trouble with your balance or moving your arms or legs. Summary After the liver biopsy, it is common to have pain, soreness, and bruising in the area, as well as sleepiness and fatigue. Take over-the-counter and prescription medicines only as told by your health care provider. Follow instructions from your health care provider about how  to care for your incision. Check the incision area daily for signs of infection. This information is not intended to replace advice given to you by your health care provider. Make sure you discuss any questions you have with your health care provider. Document Released: 04/28/2005 Document Revised: 12/02/2018 Document Reviewed:  10/19/2017 Elsevier Patient Education  2020 [employer].

## 2024-07-01 NOTE — Procedures (Signed)
 Interventional Radiology Procedure Note  Procedure: US  guided biopsy of liver lesion   Complications: None  Estimated Blood Loss: None  Recommendations: - Bedrest x 2 hrs - DC home   Signed,  Wilkie LOIS Lent, MD ;o

## 2024-07-01 NOTE — Progress Notes (Signed)
 Pt. C/o severe 8 on scale 1-10 pain to liver biopsy site. Called MD & orders received. Tylenol  1000 mg IV started now. C. Carim, PA in now at bedside speaking with pt. And her spouse.

## 2024-07-02 ENCOUNTER — Encounter
Admission: RE | Admit: 2024-07-02 | Discharge: 2024-07-02 | Disposition: A | Discharge status: Home / Self Care | Source: Ambulatory Visit | Attending: Oncology | Admitting: Oncology

## 2024-07-02 DIAGNOSIS — C50212 Malignant neoplasm of upper-inner quadrant of left female breast: Secondary | ICD-10-CM | POA: Insufficient documentation

## 2024-07-02 DIAGNOSIS — C787 Secondary malignant neoplasm of liver and intrahepatic bile duct: Secondary | ICD-10-CM | POA: Insufficient documentation

## 2024-07-02 LAB — SURGICAL PATHOLOGY

## 2024-07-02 MED ORDER — TECHNETIUM TC 99M MEDRONATE IV KIT
20.0000 | PACK | Freq: Once | INTRAVENOUS | Status: AC | PRN
  Administered 2024-07-02: 21.91 via INTRAVENOUS

## 2024-07-03 ENCOUNTER — Inpatient Hospital Stay

## 2024-07-03 NOTE — Progress Notes (Signed)
 [facility] Clinical Social Work  Clinical Social Work was referred by medical provider for advance directives questions.  Clinical Social Worker contacted caregiver by phone to offer support and assess for needs.     Interventions: Provided education and assistance to client regarding Advanced Directives. CSW spoke with patient's significant other, Camellia.  He is also interested in financial resources which will be discussed further at next meeting.      Follow Up Plan:  CSW will see patient on 9/15 at 10:30am.    [name], LCSW  Clinical Social Worker [facility]

## 2024-07-07 ENCOUNTER — Ambulatory Visit

## 2024-07-07 ENCOUNTER — Inpatient Hospital Stay

## 2024-07-07 NOTE — Progress Notes (Signed)
 [facility] Healthcare Advance Directives Clinical Social Work  Patient presented to Advance Directives Clinic  to review and complete healthcare advance directives.  Clinical Social Worker met with patient and her significant other, Tricia Berry.  The patient designated Tricia Berry as their primary healthcare agent and Tricia Berry as their secondary agent.  Patient also completed healthcare living will.    Documents were notarized and copies made for patient/family. Clinical Social Worker will send documents to medical records to be scanned into patient's chart. Clinical Social Worker encouraged patient/family to contact with any additional questions or concerns.   [name], LCSW Clinical Social Worker [facility]

## 2024-07-08 ENCOUNTER — Ambulatory Visit: Payer: Self-pay | Admitting: Oncology

## 2024-07-08 ENCOUNTER — Other Ambulatory Visit

## 2024-07-08 ENCOUNTER — Telehealth: Payer: Self-pay | Admitting: Pharmacy Technician

## 2024-07-08 ENCOUNTER — Other Ambulatory Visit: Payer: Self-pay | Admitting: Oncology

## 2024-07-08 ENCOUNTER — Telehealth: Payer: Self-pay | Admitting: Pharmacist

## 2024-07-08 ENCOUNTER — Other Ambulatory Visit (HOSPITAL_COMMUNITY): Payer: Self-pay

## 2024-07-08 DIAGNOSIS — C50012 Malignant neoplasm of nipple and areola, left female breast: Secondary | ICD-10-CM

## 2024-07-08 MED ORDER — RIBOCICLIB SUCC (400 MG DOSE) 200 MG PO TBPK
400.0000 mg | ORAL_TABLET | Freq: Every day | ORAL | 1 refills | Status: DC
  Filled 2024-07-10: qty 42, 28d supply, fill #0

## 2024-07-08 NOTE — Telephone Encounter (Signed)
 Pharmacy Student Encounter   Received new prescription for Kisqali  (ribociclib ) for the treatment of metastatic breast cancer in conjunction with fulvestrant, planned duration of until disease progression or unacceptable drug toxicity.   CMP and CBC from 06/27/24 assessed, a few abnormalities noted:   Calculated CrCl of 39 mL/min (actual body weight) AST, ALT, and Alkaline phosphatase are elevated, consistent with liver metastases.   Patient will need baseline ECG, then repeat ECG on c1d15 and again on c2d1.   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  Standard dosing for HR+, HER2-negative disease following disease progression is:  600 mg (three 200-mg tablets) orally once daily for 21 consecutive days followed by 7 days off treatment for a 28-day cycle; coadminister with fulvestrant 500 mg IM on days 1, 15, and 29 and once monthly thereafter.  MD starting patient at 400 mg dose of Kisqali  due to tolerability concerns.  Current medication list in Epic reviewed, a few DDIs with ribociclib  identified: Fentanyl :ribociclib ; ribociclib  may increase serum concentrations of fentanyl  through CYP3A4 inhibition. A fentanyl  dose reduction should be considered especially when a CYP3A4 inhibitor is added to patients receiving fentanyl  until the effects of the combination are known. Ondansetron :ribociclib ; ondansetron  may enhance the QTc-prolonging effects of ribociclib . No baseline dose adjustment needed. Oxycodone :ribociclib ; ribociclib  may increase serum concentration of oxycodone  and its active metabolite oxymorphone. Caution patient of potential increased effects from oxycodone . No baseline dose adjustment needed.  Naloxone ; historical fill of naloxone  from 2023. Confirm that patient still has access to naloxone .   Evaluated chart and no patient barriers to medication adherence identified.   Prescription has been e-scribed to the [facility] for  benefits analysis and approval.  Oral Oncology Clinic will continue to follow for insurance authorization, copayment issues, initial counseling and start date.   Signe JINNY Platt, PharmD Candidate 2026  [facility] Oral Chemotherapy Navigation Clinic [phone]  07/08/2024 10:43 AM

## 2024-07-08 NOTE — Telephone Encounter (Signed)
 Oral Oncology Patient Advocate Encounter   New authorization   Received notification that prior authorization for Kisqali  is required.   PA submitted via phone call to Sherleen (can only submit via phone due to insurance) Case ID: [id number] Status is pending     Mercede Rollo (Patty) Chet Burnet, CPhT  [facility], [place], [facility] - Oral Chemotherapy Patient Advocate Specialist III Phone: [phone]  Fax: [phone]

## 2024-07-09 ENCOUNTER — Other Ambulatory Visit: Payer: Self-pay

## 2024-07-09 ENCOUNTER — Encounter: Payer: Self-pay | Admitting: Oncology

## 2024-07-09 ENCOUNTER — Encounter (HOSPITAL_COMMUNITY): Payer: Self-pay

## 2024-07-09 ENCOUNTER — Other Ambulatory Visit (HOSPITAL_COMMUNITY): Payer: Self-pay

## 2024-07-09 ENCOUNTER — Telehealth: Payer: Self-pay | Admitting: Pharmacy Technician

## 2024-07-09 NOTE — Telephone Encounter (Signed)
 Per Dr. Melanee Please send tempus testing on her biopsy.  Tempus testing sent yesterday 07/09/24 on tissue sample only.

## 2024-07-09 NOTE — Telephone Encounter (Signed)
 Oral Oncology Patient Advocate Encounter   Was successful in obtaining a copay card for Kisqali .  This copay card will make the patients copay $0 or $25.  The copay card has an annual maximum benefit of $15,000   The billing information is as follows and has been shared with [facility].   RxBin: [id number] PCN: [id number] Member ID: [id number] Group ID: [id number]   Cachet Mccutchen (Patty) Chet Burnet, CPhT  [facility], [place], [facility] - Oral Chemotherapy Patient Advocate Specialist III Phone: [phone]  Fax: [phone]

## 2024-07-09 NOTE — Telephone Encounter (Signed)
 Oral Oncology Patient Advocate Encounter  Prior Authorization for Kisqali  has been approved.    Request ID: [id number] Effective dates: 07/08/2024 through 07/09/2025  Patients co-pay is $250.    Graylin Sperling (Patty) Chet Burnet, CPhT  [facility], [place], [facility] - Oral Chemotherapy Patient Advocate Specialist III Phone: [phone]  Fax: [phone]

## 2024-07-09 NOTE — Telephone Encounter (Signed)
 Oral Oncology Patient Advocate Encounter   Was successful in enrolling patient in free trial program for Kisqali .   This will allow a one time 30 day fill of medication at no charge.  The billing information is as follows and has been shared with [facility].   RxBin: [id number] PCN: [id number] Member ID: [id number] Group ID: [id number]  Zainab Crumrine (Patty) Chet Burnet, CPhT  [facility], [place], [facility] - Oral Chemotherapy Patient Advocate Specialist III Phone: [phone]  Fax: [phone]

## 2024-07-10 ENCOUNTER — Other Ambulatory Visit: Payer: Self-pay | Admitting: Pharmacy Technician

## 2024-07-10 ENCOUNTER — Other Ambulatory Visit: Payer: Self-pay

## 2024-07-10 ENCOUNTER — Encounter: Payer: Self-pay | Admitting: Oncology

## 2024-07-10 ENCOUNTER — Telehealth: Payer: Self-pay | Admitting: Pharmacy Technician

## 2024-07-10 ENCOUNTER — Other Ambulatory Visit (HOSPITAL_COMMUNITY): Payer: Self-pay

## 2024-07-10 NOTE — Telephone Encounter (Signed)
 Patient successfully OnBoarded and drug education provided by pharmacist. Medication scheduled to be shipped on 09/19 for delivery on 09/22 from [facility] to patient's address. Patient also knows to call me at [phone] with any questions or concerns regarding receiving medication or if there is any unexpected change in co-pay.   Tricia Berry (Patty) Chet Burnet, CPhT  [facility], [place], [facility] - Oral Chemotherapy Patient Advocate Specialist III Phone: [phone]  Fax: [phone]

## 2024-07-10 NOTE — Progress Notes (Unsigned)
 Specialty Pharmacy Initial Fill Coordination Note  Tricia Berry is a 53 y.o. female contacted today regarding refills of specialty medication(s) Ribociclib  Succinate (KISQALI  400mg  Daily Dose) .  Patient requested Delivery  on 07/14/24  to verified address [address] [place] [address]   Medication will be filled on 07/11/2024.   Patient is aware of $0 copayment. Bill copay card.  [name] (Patty) Chet Burnet, CPhT  [facility], [place], [facility] - Oral Chemotherapy Patient Advocate Specialist III Phone: [phone]  Fax: [phone]

## 2024-07-11 ENCOUNTER — Other Ambulatory Visit: Payer: Self-pay

## 2024-07-11 ENCOUNTER — Encounter: Payer: Self-pay | Admitting: Oncology

## 2024-07-11 NOTE — Progress Notes (Signed)
 Specialty Pharmacy Initiation Note   [name] G Garinger is a 53 y.o. female who will be followed by the specialty pharmacy service for RxSp Oncology    Review of administration, indication, effectiveness, safety, potential side effects, storage/disposable, and missed dose instructions occurred today for patient's specialty medication(s) No data recorded    Patient/Caregiver did not have any additional questions or concerns.   Patient's therapy is appropriate to: Initiate    Goals Addressed             This Visit's Progress    Slow Disease Progression       Patient is initiating therapy. Patient will maintain adherence         Brandalynn Ofallon N Takashi Korol Specialty Pharmacist

## 2024-07-14 ENCOUNTER — Encounter: Payer: Self-pay | Admitting: Oncology

## 2024-07-14 ENCOUNTER — Other Ambulatory Visit: Payer: Self-pay

## 2024-07-14 ENCOUNTER — Inpatient Hospital Stay (HOSPITAL_BASED_OUTPATIENT_CLINIC_OR_DEPARTMENT_OTHER): Admitting: Oncology

## 2024-07-14 ENCOUNTER — Inpatient Hospital Stay

## 2024-07-14 ENCOUNTER — Inpatient Hospital Stay (HOSPITAL_BASED_OUTPATIENT_CLINIC_OR_DEPARTMENT_OTHER): Admitting: Hospice and Palliative Medicine

## 2024-07-14 ENCOUNTER — Inpatient Hospital Stay: Admitting: Pharmacist

## 2024-07-14 VITALS — BP 106/71 | HR 80 | Temp 97.0°F | Resp 19 | Ht 62.0 in | Wt 94.3 lb

## 2024-07-14 DIAGNOSIS — Z7189 Other specified counseling: Secondary | ICD-10-CM

## 2024-07-14 DIAGNOSIS — C50012 Malignant neoplasm of nipple and areola, left female breast: Secondary | ICD-10-CM

## 2024-07-14 DIAGNOSIS — Z515 Encounter for palliative care: Secondary | ICD-10-CM

## 2024-07-14 DIAGNOSIS — C50212 Malignant neoplasm of upper-inner quadrant of left female breast: Secondary | ICD-10-CM | POA: Diagnosis not present

## 2024-07-14 DIAGNOSIS — Z170 Estrogen receptor positive status [ER+]: Secondary | ICD-10-CM

## 2024-07-14 DIAGNOSIS — G893 Neoplasm related pain (acute) (chronic): Secondary | ICD-10-CM

## 2024-07-14 DIAGNOSIS — Z79818 Long term (current) use of other agents affecting estrogen receptors and estrogen levels: Secondary | ICD-10-CM

## 2024-07-14 DIAGNOSIS — Z79891 Long term (current) use of opiate analgesic: Secondary | ICD-10-CM

## 2024-07-14 DIAGNOSIS — Z79899 Other long term (current) drug therapy: Secondary | ICD-10-CM

## 2024-07-14 DIAGNOSIS — C787 Secondary malignant neoplasm of liver and intrahepatic bile duct: Secondary | ICD-10-CM | POA: Diagnosis not present

## 2024-07-14 DIAGNOSIS — Z5181 Encounter for therapeutic drug level monitoring: Secondary | ICD-10-CM | POA: Diagnosis not present

## 2024-07-14 LAB — CBC WITH DIFFERENTIAL (CANCER CENTER ONLY)
Abs Immature Granulocytes: 0.13 K/uL — ABNORMAL HIGH (ref 0.00–0.07)
Basophils Absolute: 0.1 K/uL (ref 0.0–0.1)
Basophils Relative: 1 %
Eosinophils Absolute: 0 K/uL (ref 0.0–0.5)
Eosinophils Relative: 0 %
HCT: 35.2 % — ABNORMAL LOW (ref 36.0–46.0)
Hemoglobin: 11.2 g/dL — ABNORMAL LOW (ref 12.0–15.0)
Immature Granulocytes: 2 %
Lymphocytes Relative: 35 %
Lymphs Abs: 2.1 K/uL (ref 0.7–4.0)
MCH: 29.7 pg (ref 26.0–34.0)
MCHC: 31.8 g/dL (ref 30.0–36.0)
MCV: 93.4 fL (ref 80.0–100.0)
Monocytes Absolute: 0.5 K/uL (ref 0.1–1.0)
Monocytes Relative: 8 %
Neutro Abs: 3.2 K/uL (ref 1.7–7.7)
Neutrophils Relative %: 54 %
Platelet Count: 39 K/uL — ABNORMAL LOW (ref 150–400)
RBC: 3.77 MIL/uL — ABNORMAL LOW (ref 3.87–5.11)
RDW: 22.3 % — ABNORMAL HIGH (ref 11.5–15.5)
Smear Review: DECREASED
WBC Count: 6 K/uL (ref 4.0–10.5)
nRBC: 5.3 % — ABNORMAL HIGH (ref 0.0–0.2)

## 2024-07-14 LAB — CMP (CANCER CENTER ONLY)
ALT: 37 U/L (ref 0–44)
AST: 158 U/L — ABNORMAL HIGH (ref 15–41)
Albumin: 3.2 g/dL — ABNORMAL LOW (ref 3.5–5.0)
Alkaline Phosphatase: 490 U/L — ABNORMAL HIGH (ref 38–126)
Anion gap: 9 (ref 5–15)
BUN: 20 mg/dL (ref 6–20)
CO2: 23 mmol/L (ref 22–32)
Calcium: 12.4 mg/dL — ABNORMAL HIGH (ref 8.9–10.3)
Chloride: 98 mmol/L (ref 98–111)
Creatinine: 0.98 mg/dL (ref 0.44–1.00)
GFR, Estimated: >60 mL/min (ref >60)
Glucose, Bld: 101 mg/dL — ABNORMAL HIGH (ref 70–99)
Potassium: 4.1 mmol/L (ref 3.5–5.1)
Sodium: 130 mmol/L — ABNORMAL LOW (ref 135–145)
Total Bilirubin: 2.5 mg/dL — ABNORMAL HIGH (ref 0.0–1.2)
Total Protein: 6.1 g/dL — ABNORMAL LOW (ref 6.5–8.1)

## 2024-07-14 MED ORDER — NALOXONE HCL 4 MG/0.1ML NA LIQD
NASAL | 2 refills | Status: DC
  Filled 2024-07-14: qty 2, 30d supply, fill #0

## 2024-07-14 MED ORDER — FULVESTRANT 250 MG/5ML IM SOSY
500.0000 mg | PREFILLED_SYRINGE | Freq: Once | INTRAMUSCULAR | Status: AC
  Administered 2024-07-14: 500 mg via INTRAMUSCULAR
  Filled 2024-07-14: qty 10

## 2024-07-14 MED ORDER — ZOLEDRONIC ACID 4 MG/100ML IV SOLN
4.0000 mg | Freq: Once | INTRAVENOUS | Status: AC
  Administered 2024-07-14: 4 mg via INTRAVENOUS
  Filled 2024-07-14: qty 100

## 2024-07-14 MED ORDER — PROCHLORPERAZINE MALEATE 10 MG PO TABS
10.0000 mg | ORAL_TABLET | Freq: Four times a day (QID) | ORAL | 2 refills | Status: DC | PRN
  Filled 2024-07-14: qty 30, 8d supply, fill #0

## 2024-07-14 MED ORDER — SODIUM CHLORIDE 0.9 % IV SOLN
Freq: Once | INTRAVENOUS | Status: AC
  Filled 2024-07-14: qty 250

## 2024-07-14 MED ORDER — ZOLEDRONIC ACID 4 MG/100ML IV SOLN: 4.0000 mg | Freq: Once | INTRAVENOUS | Status: DC

## 2024-07-14 MED ORDER — OXYCODONE HCL 10 MG PO TABS
10.0000 mg | ORAL_TABLET | ORAL | 0 refills | Status: DC | PRN
  Filled 2024-07-14: qty 180, 30d supply, fill #0

## 2024-07-14 MED ORDER — OXYCODONE HCL 5 MG PO TABS
10.0000 mg | ORAL_TABLET | Freq: Once | ORAL | Status: AC
  Administered 2024-07-14: 10 mg via ORAL
  Filled 2024-07-14: qty 2

## 2024-07-14 NOTE — Progress Notes (Unsigned)
 Patient has a few concerns that she would like insight about.

## 2024-07-14 NOTE — Progress Notes (Signed)
 Ok for zometa  4 mg per dr Melanee due to ca level of 12 although calculated dose would be 3.3mg  a higher dose is warranted

## 2024-07-14 NOTE — Progress Notes (Unsigned)
 Palliative Medicine [facility] at [facility] [phone] [phone]   Name: ARNETRA TERRIS Date: 07/14/2024 MRN: [id number]  DOB: 05-18-1971  Patient Care Team: Dineen Rollene KANDICE, FNP as PCP - General (Family Medicine) Darliss Rogue, MD as PCP - Cardiology (Cardiology) Cindie Jesusa HERO, RN as Oncology Nurse Navigator Melanee Annah BROCKS, MD as Consulting Physician (Hematology and Oncology) Bula Powell PARAS, RN as Registered Nurse (Oncology) Kerrie Latour, Fonda SAUNDERS, NP as Nurse Practitioner (Hospice and Palliative Medicine) Isadora Hose, MD as Consulting Physician (Pulmonary Disease)    REASON FOR CONSULTATION: BENITA BOONSTRA is a 53 y.o. 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  Patient has had significant weakness and pain.  She is referred to palliative care to address goals of manage ongoing symptoms.  SOCIAL HISTORY:     reports that she has been smoking cigarettes. She has a 20 pack-year smoking history. She has never used smokeless tobacco. She reports that she does not currently use alcohol. She reports that she does not use drugs.  Patient lives at home with her significant other and 2 adult sons.  Patient previously worked as an Insurance underwriter at [employer].  ADVANCE DIRECTIVES:  On file  CODE STATUS:   PAST MEDICAL HISTORY: Past Medical History:  Diagnosis Date  . Anemia   . Anginal pain    excertion not cardiac related per patient  . Anxiety   . Breast cancer (HCC) 12/2021   left breast IMC  . Diverticulitis   . Dyspnea   . Dysrhythmia    prolong QT  . Family history of ovarian cancer   . GERD (gastroesophageal reflux disease)   . Headache   . IBS (irritable bowel syndrome)   . Neuromuscular disorder (HCC)    neuropathy from chemo  . Pneumonia   . PONV (postoperative nausea and vomiting)    severe migraine and vomiting post  anesthesia  . Scoliosis     PAST SURGICAL HISTORY:  Past Surgical History:  Procedure Laterality Date  . ABDOMINAL HYSTERECTOMY     still has ovaries, no gyn cancer, hysterectomy due to endometriosis. NO cervix on exam 01/10/21  . BACK SURGERY    . BREAST BIOPSY Left 09/15/2020   us  bx of mass, path pending, Q marker  . BREAST BIOPSY Left 09/15/2020   us  bx of LN, hydromarker, path pending  . BREAST RECONSTRUCTION WITH PLACEMENT OF TISSUE EXPANDER AND FLEX HD (ACELLULAR HYDRATED DERMIS) Bilateral 03/17/2021   Procedure: IMMEDIATE BILATERAL BREAST RECONSTRUCTION WITH PLACEMENT OF TISSUE EXPANDER AND FLEX HD (ACELLULAR HYDRATED DERMIS);  Surgeon: Lowery Estefana RAMAN, DO;  Location: [facility];  Service: Plastics;  Laterality: Bilateral;  . CAPSULECTOMY Right 10/18/2022   Procedure: Right breast capsule release with adjustment;  Surgeon: Lowery Estefana RAMAN, DO;  Location: [facility];  Service: Plastics;  Laterality: Right;  . COLONOSCOPY WITH PROPOFOL  N/A 11/21/2021   Procedure: COLONOSCOPY WITH PROPOFOL ;  Surgeon: Unk Corinn Skiff, MD;  Location: [facility];  Service: Gastroenterology;  Laterality: N/A;  . FLEXIBLE BRONCHOSCOPY Bilateral 12/01/2022   Procedure: FLEXIBLE BRONCHOSCOPY;  Surgeon: Isadora Hose, MD;  Location: [facility]S;  Service: Pulmonary;  Laterality: Bilateral;  . HAND SURGERY Right 10/2023  . IR IMAGING GUIDED PORT INSERTION  10/01/2020  . MODIFIED MASTECTOMY Left 03/17/2021   Procedure: LEFT MODIFIED RADICAL MASTECTOMY;  Surgeon: Vanderbilt Ned, MD;  Location: [facility];  Service: General;  Laterality: Left;  . PORTA CATH REMOVAL Right 03/17/2021   Procedure: PORTA CATH REMOVAL;  Surgeon: Vanderbilt Ned, MD;  Location: [facility];  Service: General;  Laterality: Right;  . REMOVAL OF BILATERAL TISSUE EXPANDERS WITH PLACEMENT OF BILATERAL BREAST IMPLANTS Bilateral 05/09/2021   Procedure: REMOVAL OF  BILATERAL TISSUE EXPANDERS WITH PLACEMENT OF BILATERAL BREAST IMPLANTS;  Surgeon: Lowery Estefana RAMAN, DO;  Location: [facility];  Service: Plastics;  Laterality: Bilateral;  90 min  . saginal band repair and capsule release  10/2023  . TOTAL MASTECTOMY Right 03/17/2021   Procedure: TOTAL MASTECTOMY;  Surgeon: Vanderbilt Ned, MD;  Location: [facility];  Service: General;  Laterality: Right;    HEMATOLOGY/ONCOLOGY HISTORY:  Oncology History  Malignant neoplasm of left female breast (HCC)  09/26/2020 Initial Diagnosis   Malignant neoplasm of upper-outer quadrant of left breast in female, estrogen receptor positive (HCC)   10/08/2020 - 02/18/2021 Chemotherapy         10/08/2020 Cancer Staging   Staging form: Breast, AJCC 8th Edition - Clinical stage from 10/08/2020: Stage IIIB (cT4, cN1, cM0, G2, ER+, PR+, HER2-) - Signed by Melanee Annah BROCKS, MD on 10/08/2020    Genetic Testing   Negative genetic testing. No pathogenic variants identified on the [facility] Common Hereditary Cancers Panel. The report date is 11/18/2020.  The Multi-Cancer Panel offered by [facility] includes sequencing and/or deletion duplication testing of the following 84 genes: AIP, ALK, APC, ATM, AXIN2,BAP1,  BARD1, BLM, BMPR1A, BRCA1, BRCA2, BRIP1, CASR, CDC73, CDH1, CDK4, CDKN1B, CDKN1C, CDKN2A (p14ARF), CDKN2A (p16INK4a), CEBPA, CHEK2, CTNNA1, DICER1, DIS3L2, EGFR (c.2369C>T, p.Thr790Met variant only), EPCAM (Deletion/duplication testing only), FH, FLCN, GATA2, GPC3, GREM1 (Promoter region deletion/duplication testing only), HOXB13 (c.251G>A, p.Gly84Glu), HRAS, KIT, MAX, MEN1, MET, MITF (c.952G>A, p.Glu318Lys variant only), MLH1, MSH2, MSH3, MSH6, MUTYH, NBN, NF1, NF2, NTHL1, PALB2, PDGFRA, PHOX2B, PMS2, POLD1, POLE, POT1, PRKAR1A, PTCH1, PTEN, RAD50, RAD51C, RAD51D, RB1, RECQL4, RET,  RUNX1, SDHAF2, SDHA (sequence changes only), SDHB, SDHC, SDHD, SMAD4, SMARCA4, SMARCB1, SMARCE1, STK11, SUFU, TERC,  TERT, TMEM127, TP53, TSC1, TSC2, VHL, WRN and WT1.    04/11/2021 Cancer Staging   Staging form: Breast, AJCC 8th Edition - Pathologic stage from 04/11/2021: No Stage Recommended (ypT2, pN2a, cM0, G2, ER+, PR+, HER2+) - Signed by Melanee Annah BROCKS, MD on 04/11/2021 Stage prefix: Post-therapy Histologic grading system: 3 grade system   04/27/2021 - 06/10/2021 Chemotherapy         07/01/2021 - 06/07/2022 Chemotherapy   Patient is on Treatment Plan : BREAST Weekly Paclitaxel  + Trastuzumab  + Pertuzumab  q21d x 8 cycles / Trastuzumab  + Pertuzumab  q21d x 4 cycles     06/28/2022 - 07/19/2022 Chemotherapy   Patient is on Treatment Plan : BREAST Trastuzumab   + Pertuzumab  q21d x 13 cycles     07/14/2024 -  Chemotherapy   Patient is on Treatment Plan : BREAST Fulvestrant  q28d       ALLERGIES:  is allergic to morphine, sertraline hcl, sulfa antibiotics, sulfamethoxazole, and sulfonamide derivatives.  MEDICATIONS:  Current Outpatient Medications  Medication Sig Dispense Refill  . acetaminophen  (TYLENOL ) 650 MG CR tablet Take 1,300 mg by mouth every 8 (eight) hours as needed for pain.    . albuterol  (PROVENTIL ) (2.5 MG/3ML) 0.083% nebulizer solution Take 3 mLs (2.5 mg total) by nebulization every 6 (six) hours as needed for wheezing or shortness of breath. 360 mL 12  . albuterol  (VENTOLIN  HFA) 108 (90 Base) MCG/ACT inhaler Inhale 2 puffs into the  lungs every 6 (six) hours as needed for wheezing or shortness of breath. 6.7 g 0  . anastrozole  (ARIMIDEX ) 1 MG tablet Take 1 tablet (1 mg total) by mouth daily. (Patient not taking: Reported on 07/14/2024) 30 tablet 5  . b complex vitamins capsule Take 1 capsule by mouth daily. (Patient not taking: Reported on 07/14/2024)    . baclofen  (LIORESAL ) 10 MG tablet Take 1 tablet (10 mg total) by mouth 3 (three) times daily as needed for muscle spasms. (Patient not taking: Reported on 07/14/2024) 60 tablet 2  . calcium -vitamin D  (OSCAL WITH D) 500-5 MG-MCG tablet Take 1 tablet by  mouth daily with breakfast. (Patient not taking: Reported on 07/14/2024)    . ciclopirox  (PENLAC ) 8 % solution Apply over nail and surrounding skin daily. Apply daily over previous coat. After seven (7) days, remove with alcohol and continue cycle.; continue therapy until nail clearance (max duration: 48 wks) (Patient not taking: Reported on 07/14/2024) 6.6 mL 0  . docusate sodium  (COLACE) 100 MG capsule Take 100 mg by mouth daily as needed for mild constipation.    . fentaNYL  (DURAGESIC ) 12 MCG/HR Place 1 patch onto the skin every 3 (three) days. 10 patch 0  . FLUoxetine  (PROZAC ) 40 MG capsule Take 1 capsule (40 mg total) by mouth daily. 90 capsule 3  . gentamicin  ointment (GARAMYCIN ) 0.1 % Apply to nose 3 (three) times daily. (Patient not taking: Reported on 07/14/2024) 30 g 12  . ibuprofen  (ADVIL ) 400 MG tablet Take 400 mg by mouth every 6 (six) hours as needed. (Patient not taking: Reported on 07/14/2024)    . Lactobacillus (PROBIOTIC ACIDOPHILUS PO) Take 1 capsule by mouth daily. (Patient not taking: Reported on 07/14/2024)    . lidocaine -prilocaine  (EMLA ) cream Apply topically. (Patient not taking: Reported on 07/14/2024)    . LORazepam  (ATIVAN ) 0.5 MG tablet Take 1 tablet (0.5 mg total) by mouth 2 (two) times daily as needed for anxiety. 60 tablet 2  . nicotine  (NICODERM CQ  - DOSED IN MG/24 HR) 7 mg/24hr patch Place 1 patch (7 mg total) onto the skin daily. (Patient not taking: Reported on 07/14/2024) 28 patch 2  . nicotine  polacrilex (NICORETTE ) 2 MG gum Take 1 each (2 mg total) by mouth as needed for smoking cessation. (Patient not taking: Reported on 07/14/2024) 110 tablet 0  . ondansetron  (ZOFRAN -ODT) 4 MG disintegrating tablet Take 1 tablet (4 mg total) by mouth every 8 (eight) hours as needed for nausea or vomiting. (Patient not taking: Reported on 07/14/2024) 30 tablet 1  . Oxycodone  HCl 10 MG TABS Take 1 tablet (10 mg total) by mouth every 4 (four) hours as needed for pain. 180 tablet 0  .  pravastatin  (PRAVACHOL ) 10 MG tablet Take 1 tablet (10 mg total) by mouth daily. (Patient not taking: Reported on 07/14/2024) 90 tablet 3  . pregabalin  (LYRICA ) 150 MG capsule Take 1 capsule (150 mg total) by mouth in the morning, at noon, and at bedtime. 90 capsule 2  . ribociclib  succ (KISQALI , 400 MG DOSE,) 200 MG Therapy Pack Take 2 tablets (400 mg total) by mouth daily. Take for 21 days on, 7 days off, repeat every 28 days. 42 tablet 1  . terbinafine  (LAMISIL ) 1 % cream Apply 1 Application topically 2 (two) times daily. (Patient not taking: Reported on 07/14/2024) 30 g 0  . trimethoprim  (TRIMPEX ) 100 MG tablet Take 1 tablet (100 mg total) by mouth daily. 90 tablet 3  . umeclidinium-vilanterol (ANORO ELLIPTA ) 62.5-25 MCG/ACT AEPB Inhale  1 puff into the lungs daily. (Patient not taking: Reported on 07/14/2024) 60 each 6  . vitamin B-12 (CYANOCOBALAMIN ) 100 MCG tablet Take 100 mcg by mouth daily. gummy (Patient not taking: Reported on 07/14/2024)     No current facility-administered medications for this visit.   Facility-Administered Medications Ordered in Other Visits  Medication Dose Route Frequency Provider Last Rate Last Admin  . 0.9 %  sodium chloride  infusion   Intravenous Once Rao, Archana C, MD      . acetaminophen  (TYLENOL ) 325 MG tablet           . diphenhydrAMINE  (BENADRYL ) 25 mg capsule           . fulvestrant  (FASLODEX ) injection 500 mg  500 mg Intramuscular Once Rao, Archana C, MD      . goserelin (ZOLADEX ) injection 3.6 mg  3.6 mg Subcutaneous Q28 days Melanee Annah BROCKS, MD   3.6 mg at 02/02/23 1206  . heparin  lock flush 100 unit/mL  500 Units Intravenous Once Melanee Annah BROCKS, MD      . Zoledronic  Acid (ZOMETA ) 4 MG/100ML IVPB             VITAL SIGNS: There were no vitals taken for this visit. There were no vitals filed for this visit.  Estimated body mass index is 17.25 kg/m as calculated from the following:   Height as of an earlier encounter on 07/14/24: 5' 2 (1.575 m).    Weight as of an earlier encounter on 07/14/24: 94 lb 4.8 oz (42.8 kg).  LABS: CBC:    Component Value Date/Time   WBC 6.3 07/01/2024 0945   HGB 11.9 (L) 07/01/2024 0945   HGB 12.8 06/27/2024 1400   HGB 14.3 08/23/2020 1033   HCT 37.4 07/01/2024 0945   HCT 42.9 08/23/2020 1033   PLT 48 (L) 07/01/2024 0945   PLT 55 (L) 06/27/2024 1400   PLT 222 08/23/2020 1033   MCV 90.8 07/01/2024 0945   MCV 91 08/23/2020 1033   NEUTROABS 4.2 06/27/2024 1400   NEUTROABS 2.7 08/23/2020 1033   LYMPHSABS 2.9 06/27/2024 1400   LYMPHSABS 4.2 (H) 08/23/2020 1033   MONOABS 0.5 06/27/2024 1400   EOSABS 0.0 06/27/2024 1400   EOSABS 0.1 08/23/2020 1033   BASOSABS 0.1 06/27/2024 1400   BASOSABS 0.1 08/23/2020 1033   Comprehensive Metabolic Panel:    Component Value Date/Time   NA 137 06/27/2024 1400   K 4.1 06/27/2024 1400   CL 101 06/27/2024 1400   CO2 24 06/27/2024 1400   BUN 15 06/27/2024 1400   CREATININE 1.23 (H) 06/27/2024 1400   GLUCOSE 113 (H) 06/27/2024 1400   CALCIUM  11.2 (H) 06/27/2024 1400   AST 219 (HH) 06/27/2024 1400   ALT 71 (H) 06/27/2024 1400   ALKPHOS 421 (H) 06/27/2024 1400   BILITOT 1.2 06/27/2024 1400   PROT 6.9 06/27/2024 1400   ALBUMIN 3.5 06/27/2024 1400    RADIOGRAPHIC STUDIES: NM Bone Scan Whole Body Result Date: 07/02/2024 EXAM: NM WHOLE BODY BONE SCAN 07/02/2024 03:40:00 PM TECHNIQUE: Anterior and posterior whole body images were obtained at least 3 hours following the intravenous administration of radiopharmaceutical. RADIOPHARMACEUTICAL: 21.91 millicurie (technetium medronate (TC-MDP) injection 20 millicurie TECHNETIUM TC 99M  MEDRONATE IV KIT) COMPARISON: CT of the chest, abdomen and pelvis with contrast 06/24/2024. CLINICAL HISTORY: Breast cancer. Malignant neoplasm of upper-inner quadrant of left female breast, unspecified estrogen receptor status. Metastasis to liver. Patient reports left breast cancer 3.5 years ago. Bilateral mastectomy with left lymph nodes  removed.  Bone pain right side, mid back \\T \ lower back for approximately 4 weeks. FINDINGS: BONES: Heterogeneous uptake is present in the proximal femurs, left greater than right. Heterogeneous diffuse uptake is present in the ribs. Levoconvex scoliosis is present in the lumbar spine. Uptake is present anteriorly at the L3 vertebral body. Uptake at the shoulders, right greater than left, is likely degenerative. Diffuse uptake is present in the calvarium. SOFT TISSUES: Physiologic activity within the kidneys and urinary bladder. IMPRESSION: 1. Heterogeneous uptake in the proximal femurs, left greater than right, and diffuse uptake in the ribs and calvarium, is most consistent with treated disease. The area in the proximal left femur is most concerning for residual or recurrent active tumor. 2. Uptake at the shoulders, right greater than left, likely degenerative. 3. Uptake anteriorly at the L3 vertebral body. Electronically signed by: Lonni Necessary MD 07/02/2024 04:02 PM EDT RP Workstation: [id number]   US  BIOPSY (LIVER) Result Date: 07/01/2024 INDICATION: Breast cancer with extensive liver lesions concerning for metastatic disease. EXAM: ULTRASOUND BIOPSY CORE LIVER MEDICATIONS: None. ANESTHESIA/SEDATION: Moderate (conscious) sedation was employed during this procedure. A total of Versed  2 mg and Fentanyl  100 mcg was administered intravenously by the Radiology nurse. Moderate Sedation Time: 10 minutes. The patient's level of consciousness and vital signs were monitored continuously by radiology nursing throughout the procedure under my direct supervision. FLUOROSCOPY TIME:  None. COMPLICATIONS: None immediate. PROCEDURE: Informed written consent was obtained from the patient after a thorough discussion of the procedural risks, benefits and alternatives. All questions were addressed. Maximal Sterile Barrier Technique was utilized including caps, mask, sterile gowns, sterile gloves, sterile drape, hand hygiene  and skin antiseptic. A timeout was performed prior to the initiation of the procedure. The right upper quadrant was interrogated with ultrasound. Numerous hypoechoic solid liver lesions are visible. A suitable skin entry site was selected and marked. The region was sterilely prepped and draped in the standard fashion using chlorhexidine  skin prep. Local anesthesia was attained by infiltration with 1% lidocaine . A small dermatotomy was made. Under real-time sonographic guidance, a 17 gauge introducer needle was advanced into the margin of the mass. Multiple 18 gauge biopsies were then coaxially obtained using the BioPince automated biopsy device. Biopsy samples were placed in formalin and delivered to pathology for further analysis. As a 17 gauge introducer needle was removed, the biopsy tract was embolized with a Gel-Foam slurry. Post biopsy ultrasound imaging demonstrates no evidence of active hemorrhage or perihepatic hematoma. The patient tolerated the procedure well. IMPRESSION: Technically successful ultrasound-guided core biopsy of hepatic lesion. Electronically Signed   By: Wilkie Lent M.D.   On: 07/01/2024 12:05   CT CHEST ABDOMEN PELVIS W CONTRAST Result Date: 06/24/2024 CLINICAL DATA:  Right chest and abdominal pain for 2-3 weeks with nausea. History of left breast cancer in 2021 with bilateral mastectomy, ultrasound 06/20/2024 showed solid lesions throughout the liver compatible with metastatic disease. Restaging assessment. * Tracking Code: BO * EXAM: CT CHEST, ABDOMEN, AND PELVIS WITH CONTRAST TECHNIQUE: Multidetector CT imaging of the chest, abdomen and pelvis was performed following the standard protocol during bolus administration of intravenous contrast. RADIATION DOSE REDUCTION: This exam was performed according to the departmental dose-optimization program which includes automated exposure control, adjustment of the mA and/or kV according to patient size and/or use of iterative  reconstruction technique. CONTRAST:  80mL OMNIPAQUE  IOHEXOL  300 MG/ML  SOLN COMPARISON:  Ultrasound 06/20/2024 and CT scans from 08/31/2023 and 08/21/2023 FINDINGS: CT CHEST FINDINGS Cardiovascular: Mild atheromatous vascular disease of the  thoracic aorta Mediastinum/Nodes: Right paratracheal node 1.0 cm in short axis on image 18 series 2, formerly 1.2 cm. Lungs/Pleura: Scattered hazy and reticular opacities in the lungs, right greater than left, with new scattered faint and mostly ill-defined and subsolid nodularity in the lungs. Most of this is at or below 5 mm in diameter Ill-defined part solid left upper lobe nodule measuring 1.0 by 0.8 cm with the more solid component measuring 0.7 by 0.4 cm, image 39 series 3. Index right lower lobe nodule 0.5 by 0.4 cm on image 91 series 3. Subpleural reticulation anteriorly in the lungs, left greater than right, with prior radiation therapy. Underlying emphysema. Musculoskeletal: Widespread irregular bony heterogeneity in the ribs, sternum, and thoracic spine suspicious for widespread osseous metastatic disease. This is new compared to 08/21/2023. CT ABDOMEN PELVIS FINDINGS Hepatobiliary: As shown on the ultrasound from 06/20/2024, there is widespread heavy burden of targetoid enhancing lesions throughout all segments of the liver compatible with extensive hepatic metastatic disease. Index lesion in the medial segment left hepatic lobe measures 4.0 by 3.1 cm on image 55 series 2. Gallbladder unremarkable. Pancreas: Unremarkable Spleen: 0.7 cm hypodense lesion anteriorly in the spleen on image 54 series 2 is new compared to the prior exam. Metastatic lesion not excluded Adrenals/Urinary Tract: Unremarkable Stomach/Bowel: Sigmoid and distal descending colon diverticulosis. No findings of active diverticulitis. Vascular/Lymphatic: Atherosclerosis is present, including aortoiliac atherosclerotic disease. Reproductive: Uterus absent.  Adnexa unremarkable. Other: No supplemental  non-categorized findings. Musculoskeletal: Dextroconvex thoracolumbar scoliosis with rotary component. Widespread bony heterogeneity compatible with extensive osseous metastatic disease, new when compared to 08/31/2023. IMPRESSION: 1. Widespread hepatic and osseous metastatic disease, new compared to 08/31/2023 and 08/21/2023. 2. New scattered faint and mostly ill-defined and subsolid nodularity in the lungs, right greater than left, with new scattered hazy and reticular opacities in the lungs, right greater than left. This could be from atypical infection or inflammation, but metastatic disease is not excluded. 3. New 0.7 cm hypodense lesion anteriorly in the spleen, metastatic lesion not excluded. 4. Sigmoid and distal descending colon diverticulosis. 5. Dextroconvex thoracolumbar scoliosis with rotary component. 6.  Aortic Atherosclerosis (ICD10-I70.0). Electronically Signed   By: Ryan Salvage M.D.   On: 06/24/2024 16:02   US  Abdomen Limited RUQ (LIVER/GB) Result Date: 06/20/2024 CLINICAL DATA:  53 year old female with elevated liver enzymes. Brain MRI last month states metastatic right breast cancer. EXAM: ULTRASOUND ABDOMEN LIMITED RIGHT UPPER QUADRANT COMPARISON:  CT Abdomen and Pelvis 08/31/2023. FINDINGS: Gallbladder: No gallstones or wall thickening visualized. No sonographic Murphy sign noted by sonographer. Common bile duct: Diameter: 6 mm, upper limits of normal. Liver: Innumerable hypoechoic and rounded liver lesions (images 17 and 18), were not apparent by CT last year. Underlying chronic hepatic steatosis. Individual liver masses up to 5.3 cm in the right lobe, 3.8 cm in the left. Portal vein is patent on color Doppler imaging with normal direction of blood flow towards the liver. Other: Negative visible right kidney.  No free fluid. IMPRESSION: 1. Extensive Liver Metastases.  No right upper quadrant free fluid. 2. Negative gallbladder. No evidence of bile duct obstruction. These results will  be called to the ordering clinician or representative by the Radiologist Assistant, and communication documented in the PACS or [employer]. Electronically Signed   By: VEAR Hurst M.D.   On: 06/20/2024 12:18    PERFORMANCE STATUS (ECOG) : 3 - Symptomatic, >50% confined to bed  Review of Systems Unless otherwise noted, a complete review of systems is negative.  Physical  Exam General: Thin, frail-appearing Pulmonary: Unlabored Abdomen: soft, nontender, + bowel sounds GU: no suprapubic tenderness Extremities: no edema, no joint deformities Skin: no rashes Neurological: Weakness but otherwise nonfocal  IMPRESSION: Patient was an add-on to my clinic schedule today at Dr. Darold request.  Patient with recently diagnosed stage IV ER/PR positive breast cancer widely metastatic to bone and liver.  Patient has been started on treatment with Kisqali .   Patient verbalizes agreement with plan.  She wants to give cancer treatment a try to see if she has clinical improvement.  She recognizes the treatment is with palliative intent.  Symptomatically, she endorses rapid progression of weakness over the last 24 to 48 hours.  She is noted to be hypercalcemic today and I suspect that this is a contributing factor.  She is scheduled for IV fluids and will receive Zometa .  We discussed home equipment and will order her a walker as ambulation has become challenging.  She does have good support from her significant other and also lives with her 2 adult sons.  Patient does have chronic pain and has previously been followed by a pain clinic in [place].  She has been taking oxycodone  10 mg every 4 hours but admits that at times she will take 2 tablets due to severe pain.  She was prescribed transdermal fentanyl  but has not started that yet.  Of note, Kisqali  can increase to see effect of transdermal fentanyl .  Will therefore, hold on initiation of transdermal fentanyl  for now.  Refill oxycodone .  Patient  recently completed ACP documents and appointed her significant other to be her healthcare power of attorney.  Patient would like to remain a full code for now but is willing to revisit that decision in the event of poor response to treatment.  PLAN: - Continue current scope of treatment - Refill oxycodone  #180 - Hold fentanyl  - PDMP reviewed - Daily bowel regimen - Naloxone  - DME: Walker - Referrals to nutrition, rehab screening, [facility] - Will bring patient back later this week for repeat labs/fluids  Case and plan discussed with Dr. Melanee  Patient expressed understanding and was in agreement with this plan. She also understands that She can call the clinic at any time with any questions, concerns, or complaints.     Time Total: 25 minutes  Visit consisted of counseling and education dealing with the complex and emotionally intense issues of symptom management and palliative care in the setting of serious and potentially life-threatening illness.Greater than 50%  of this time was spent counseling and coordinating care related to the above assessment and plan.  Signed by: Fonda Mower, PhD, NP-C

## 2024-07-14 NOTE — Progress Notes (Signed)
 Clinical Pharmacist Practitioner Clinic [facility]  [phone] [phone]  Patient Care Team: Dineen Rollene MATSU, FNP as PCP - General (Family Medicine) Darliss Rogue, MD as PCP - Cardiology (Cardiology) Cindie Jesusa HERO, RN as Oncology Nurse Navigator Melanee Annah BROCKS, MD as Consulting Physician (Hematology and Oncology) Bula Powell PARAS, RN as Registered Nurse (Oncology) Borders, Fonda SAUNDERS, NP as Nurse Practitioner [facility] and Palliative Medicine) Isadora Hose, MD as Consulting Physician (Pulmonary Disease)   Name of the patient: Tricia Berry  [id number]  1970-12-04   Date of visit: 07/14/24   HPI: Patient is a 53 y.o. female with  metastatic breast cancer.  Reason for Consult: Kisqali  (ribociclib ) oral chemotherapy education.   PAST MEDICAL HISTORY: Past Medical History:  Diagnosis Date   Anemia    Anginal pain    excertion not cardiac related per patient   Anxiety    Breast cancer (HCC) 12/2021   left breast IMC   Diverticulitis    Dyspnea    Dysrhythmia    prolong QT   Family history of ovarian cancer    GERD (gastroesophageal reflux disease)    Headache    IBS (irritable bowel syndrome)    Neuromuscular disorder (HCC)    neuropathy from chemo   Pneumonia    PONV (postoperative nausea and vomiting)    severe migraine and vomiting post anesthesia   Scoliosis     HEMATOLOGY/ONCOLOGY HISTORY:  Oncology History  Malignant neoplasm of left female breast (HCC)  09/26/2020 Initial Diagnosis   Malignant neoplasm of upper-outer quadrant of left breast in female, estrogen receptor positive (HCC)   10/08/2020 - 02/18/2021 Chemotherapy         10/08/2020 Cancer Staging   Staging form: Breast, AJCC 8th Edition - Clinical stage from 10/08/2020: Stage IIIB (cT4, cN1, cM0, G2, ER+, PR+, HER2-) - Signed by Melanee Annah BROCKS, MD on 10/08/2020    Genetic Testing   Negative genetic testing. No pathogenic variants  identified on the [facility] Common Hereditary Cancers Panel. The report date is 11/18/2020.  The Multi-Cancer Panel offered by [facility] includes sequencing and/or deletion duplication testing of the following 84 genes: AIP, ALK, APC, ATM, AXIN2,BAP1,  BARD1, BLM, BMPR1A, BRCA1, BRCA2, BRIP1, CASR, CDC73, CDH1, CDK4, CDKN1B, CDKN1C, CDKN2A (p14ARF), CDKN2A (p16INK4a), CEBPA, CHEK2, CTNNA1, DICER1, DIS3L2, EGFR (c.2369C>T, p.Thr790Met variant only), EPCAM (Deletion/duplication testing only), FH, FLCN, GATA2, GPC3, GREM1 (Promoter region deletion/duplication testing only), HOXB13 (c.251G>A, p.Gly84Glu), HRAS, KIT, MAX, MEN1, MET, MITF (c.952G>A, p.Glu318Lys variant only), MLH1, MSH2, MSH3, MSH6, MUTYH, NBN, NF1, NF2, NTHL1, PALB2, PDGFRA, PHOX2B, PMS2, POLD1, POLE, POT1, PRKAR1A, PTCH1, PTEN, RAD50, RAD51C, RAD51D, RB1, RECQL4, RET,  RUNX1, SDHAF2, SDHA (sequence changes only), SDHB, SDHC, SDHD, SMAD4, SMARCA4, SMARCB1, SMARCE1, STK11, SUFU, TERC, TERT, TMEM127, TP53, TSC1, TSC2, VHL, WRN and WT1.    04/11/2021 Cancer Staging   Staging form: Breast, AJCC 8th Edition - Pathologic stage from 04/11/2021: No Stage Recommended (ypT2, pN2a, cM0, G2, ER+, PR+, HER2+) - Signed by Melanee Annah BROCKS, MD on 04/11/2021 Stage prefix: Post-therapy Histologic grading system: 3 grade system   04/27/2021 - 06/10/2021 Chemotherapy         07/01/2021 - 06/07/2022 Chemotherapy   Patient is on Treatment Plan : BREAST Weekly Paclitaxel  + Trastuzumab  + Pertuzumab  q21d x 8 cycles / Trastuzumab  + Pertuzumab  q21d x 4 cycles     06/28/2022 - 07/19/2022 Chemotherapy   Patient is on Treatment Plan : BREAST Trastuzumab   + Pertuzumab  q21d x 13 cycles  07/14/2024 -  Chemotherapy   Patient is on Treatment Plan : BREAST Fulvestrant  q28d       ALLERGIES:  is allergic to morphine, sertraline hcl, sulfa antibiotics, sulfamethoxazole, and sulfonamide derivatives.  MEDICATIONS:  Current Outpatient Medications  Medication Sig Dispense Refill    naloxone  (NARCAN ) nasal spray 4 mg/0.1 mL Place 1 spray into the nose as needed for opioid-induced respiratory depresssion. If no response within 3 minutes, repeat application in other nostril and call 911. 2 each 2   acetaminophen  (TYLENOL ) 650 MG CR tablet Take 1,300 mg by mouth every 8 (eight) hours as needed for pain.     albuterol  (PROVENTIL ) (2.5 MG/3ML) 0.083% nebulizer solution Take 3 mLs (2.5 mg total) by nebulization every 6 (six) hours as needed for wheezing or shortness of breath. 360 mL 12   albuterol  (VENTOLIN  HFA) 108 (90 Base) MCG/ACT inhaler Inhale 2 puffs into the lungs every 6 (six) hours as needed for wheezing or shortness of breath. 6.7 g 0   anastrozole  (ARIMIDEX ) 1 MG tablet Take 1 tablet (1 mg total) by mouth daily. (Patient not taking: Reported on 07/14/2024) 30 tablet 5   b complex vitamins capsule Take 1 capsule by mouth daily. (Patient not taking: Reported on 07/14/2024)     baclofen  (LIORESAL ) 10 MG tablet Take 1 tablet (10 mg total) by mouth 3 (three) times daily as needed for muscle spasms. (Patient not taking: Reported on 07/14/2024) 60 tablet 2   calcium -vitamin D  (OSCAL WITH D) 500-5 MG-MCG tablet Take 1 tablet by mouth daily with breakfast. (Patient not taking: Reported on 07/14/2024)     ciclopirox  (PENLAC ) 8 % solution Apply over nail and surrounding skin daily. Apply daily over previous coat. After seven (7) days, remove with alcohol and continue cycle.; continue therapy until nail clearance (max duration: 48 wks) (Patient not taking: Reported on 07/14/2024) 6.6 mL 0   docusate sodium  (COLACE) 100 MG capsule Take 100 mg by mouth daily as needed for mild constipation.     fentaNYL  (DURAGESIC ) 12 MCG/HR Place 1 patch onto the skin every 3 (three) days. 10 patch 0   FLUoxetine  (PROZAC ) 40 MG capsule Take 1 capsule (40 mg total) by mouth daily. 90 capsule 3   gentamicin  ointment (GARAMYCIN ) 0.1 % Apply to nose 3 (three) times daily. (Patient not taking: Reported on 07/14/2024)  30 g 12   ibuprofen  (ADVIL ) 400 MG tablet Take 400 mg by mouth every 6 (six) hours as needed. (Patient not taking: Reported on 07/14/2024)     Lactobacillus (PROBIOTIC ACIDOPHILUS PO) Take 1 capsule by mouth daily. (Patient not taking: Reported on 07/14/2024)     lidocaine -prilocaine  (EMLA ) cream Apply topically. (Patient not taking: Reported on 07/14/2024)     LORazepam  (ATIVAN ) 0.5 MG tablet Take 1 tablet (0.5 mg total) by mouth 2 (two) times daily as needed for anxiety. 60 tablet 2   nicotine  (NICODERM CQ  - DOSED IN MG/24 HR) 7 mg/24hr patch Place 1 patch (7 mg total) onto the skin daily. (Patient not taking: Reported on 07/14/2024) 28 patch 2   nicotine  polacrilex (NICORETTE ) 2 MG gum Take 1 each (2 mg total) by mouth as needed for smoking cessation. (Patient not taking: Reported on 07/14/2024) 110 tablet 0   ondansetron  (ZOFRAN -ODT) 4 MG disintegrating tablet Take 1 tablet (4 mg total) by mouth every 8 (eight) hours as needed for nausea or vomiting. (Patient not taking: Reported on 07/14/2024) 30 tablet 1   Oxycodone  HCl 10 MG TABS Take 1 tablet (10  mg total) by mouth every 4 (four) hours as needed for pain. 180 tablet 0   pravastatin  (PRAVACHOL ) 10 MG tablet Take 1 tablet (10 mg total) by mouth daily. (Patient not taking: Reported on 07/14/2024) 90 tablet 3   pregabalin  (LYRICA ) 150 MG capsule Take 1 capsule (150 mg total) by mouth in the morning, at noon, and at bedtime. 90 capsule 2   ribociclib  succ (KISQALI , 400 MG DOSE,) 200 MG Therapy Pack Take 2 tablets (400 mg total) by mouth daily. Take for 21 days on, 7 days off, repeat every 28 days. 42 tablet 1   terbinafine  (LAMISIL ) 1 % cream Apply 1 Application topically 2 (two) times daily. (Patient not taking: Reported on 07/14/2024) 30 g 0   trimethoprim  (TRIMPEX ) 100 MG tablet Take 1 tablet (100 mg total) by mouth daily. 90 tablet 3   umeclidinium-vilanterol (ANORO ELLIPTA ) 62.5-25 MCG/ACT AEPB Inhale 1 puff into the lungs daily. (Patient not taking:  Reported on 07/14/2024) 60 each 6   vitamin B-12 (CYANOCOBALAMIN ) 100 MCG tablet Take 100 mcg by mouth daily. gummy (Patient not taking: Reported on 07/14/2024)     No current facility-administered medications for this visit.   Facility-Administered Medications Ordered in Other Visits  Medication Dose Route Frequency Provider Last Rate Last Admin   0.9 %  sodium chloride  infusion   Intravenous Once Rao, Archana C, MD       acetaminophen  (TYLENOL ) 325 MG tablet            diphenhydrAMINE  (BENADRYL ) 25 mg capsule            fulvestrant  (FASLODEX ) injection 500 mg  500 mg Intramuscular Once Rao, Archana C, MD       goserelin (ZOLADEX ) injection 3.6 mg  3.6 mg Subcutaneous Q28 days Melanee Annah BROCKS, MD   3.6 mg at 02/02/23 1206   heparin  lock flush 100 unit/mL  500 Units Intravenous Once Rao, Archana C, MD       Zoledronic  Acid (ZOMETA ) 4 MG/100ML IVPB            Zoledronic  Acid (ZOMETA ) IVPB 4 mg  4 mg Intravenous Once Rao, Archana C, MD        VITAL SIGNS: There were no vitals taken for this visit. There were no vitals filed for this visit.  Estimated body mass index is 17.25 kg/m as calculated from the following:   Height as of an earlier encounter on 07/14/24: 5' 2 (1.575 m).   Weight as of an earlier encounter on 07/14/24: 42.8 kg (94 lb 4.8 oz).  LABS: CBC:    Component Value Date/Time   WBC 6.0 07/14/2024 1338   WBC 6.3 07/01/2024 0945   HGB 11.2 (L) 07/14/2024 1338   HGB 14.3 08/23/2020 1033   HCT 35.2 (L) 07/14/2024 1338   HCT 42.9 08/23/2020 1033   PLT 39 (L) 07/14/2024 1338   PLT 222 08/23/2020 1033   MCV 93.4 07/14/2024 1338   MCV 91 08/23/2020 1033   NEUTROABS 3.2 07/14/2024 1338   NEUTROABS 2.7 08/23/2020 1033   LYMPHSABS 2.1 07/14/2024 1338   LYMPHSABS 4.2 (H) 08/23/2020 1033   MONOABS 0.5 07/14/2024 1338   EOSABS 0.0 07/14/2024 1338   EOSABS 0.1 08/23/2020 1033   BASOSABS 0.1 07/14/2024 1338   BASOSABS 0.1 08/23/2020 1033   Comprehensive Metabolic Panel:     Component Value Date/Time   NA 137 06/27/2024 1400   K 4.1 06/27/2024 1400   CL 101 06/27/2024 1400   CO2 24 06/27/2024  1400   BUN 15 06/27/2024 1400   CREATININE 1.23 (H) 06/27/2024 1400   GLUCOSE 113 (H) 06/27/2024 1400   CALCIUM  11.2 (H) 06/27/2024 1400   AST 219 (HH) 06/27/2024 1400   ALT 71 (H) 06/27/2024 1400   ALKPHOS 421 (H) 06/27/2024 1400   BILITOT 1.2 06/27/2024 1400   PROT 6.9 06/27/2024 1400   ALBUMIN 3.5 06/27/2024 1400     Present during today's visit: Patient and patient's husband  Start plan: Start taking Kisqali  tomorrow morning 07/15/24.   Patient Education I spoke with patient for overview of new oral chemotherapy medication: Kisqali   Treatment goal: Palliative  Administration: Counseled patient on administration, dosing, side effects, monitoring, drug-food interactions, safe handling, storage, and disposal. Patient will take 2 tablets (400 mg) by mouth once daily for 21 days, followed by 7 days off, repeat cycle every 28 days..  Side Effects: Side effects include but not limited to: nausea, fatigue, hair loss, decreased WBCs, decreased hemoglobin, diarrhea.  Diarrhea: recommended loperamide OTC, call cancer center if 4 or more loose stools in a day.  Nausea: advised patient to stop taking ondansetron  due to interaction with Kisqali . Sending new prescription for prochlorperazine . WBCs: patient instructed to call the cancer center if they develop a fever or signs of infection at any point.     Drug-drug Interactions (DDI): Fentanyl :ribociclib ; reviewed signs and symptoms of opioid overdose. We asked about Narcan  at home, patient unsure if she still had. Sent new Narcan  prescription to patient's pharmacy. Patient plans to start fentanyl  patch this evening, and start ribociclib  tomorrow morning so that they can monitor how she does during the daytime.  Ondansetron :ribociclib ; potential additive QTc prolonging effects. Instructed patient to stop taking  ondansetron  and switch to prochlorperazine . Prescription sent to patient's preferred pharmacy.  Oxycodone :ribociclib ; discussed at the same time as the fentanyl  interaction. Same precautions should be taken as with fentanyl  patch.   Adherence: After discussion with patient no patient barriers to medication adherence identified.  Reviewed with patient importance of keeping a medication schedule and plan for any missed doses.  Distress evaluation: Patient already seen by clinic social worker.   Communication and Learning Assessment Primary learner: Patient and her husband Camellia Barriers to learning: No barriers Preferred language: English Learning preferences: Listening Reading  [facility] [place] voiced understanding and appreciation. All questions answered. Medication handout provided.  Provided patient with Oral Chemotherapy Navigation Clinic phone number. Patient knows to call the office with questions or concerns. Oral Chemotherapy Navigation Clinic will continue to follow.  Patient expressed understanding and was in agreement with this plan. She also understands that She can call clinic at any time with any questions, concerns, or complaints.   Medication Access Issues: No issue, first delivery scheduled for today from [facility]  Follow-up plan: RTC as scheduled   Thank you for allowing me to participate in the care of this patient.   Time Total: 20 minutes  Visit consisted of counseling and education on dealing with issues of symptom management in the setting of serious and potentially life-threatening illness.Greater than 50%  of this time was spent counseling and coordinating care related to the above assessment and plan.  Signed by: Shakendra Griffeth N. Breindel Collier, PharmD, NEILA, CPP Hematology/Oncology Clinical Pharmacist Practitioner [facility] [phone]  07/14/2024 2:23 PM

## 2024-07-14 NOTE — Progress Notes (Signed)
 Pt reports, I am very confused. Patient also states that, I am in a lot of pain all over. Oxycodone  given as ordered. Provider aware of pt.'s confusion and will continue to monitor. Family not concerned at this time.

## 2024-07-15 ENCOUNTER — Encounter: Payer: Self-pay | Admitting: Oncology

## 2024-07-15 ENCOUNTER — Inpatient Hospital Stay

## 2024-07-15 NOTE — Progress Notes (Signed)
 [facility] CSW Progress Note  Clinical Child psychotherapist contacted caregiver by phone to follow-up on need for community resources.    Interventions: Provided patient with information about home care options.  Patient's partner, Camellia, reports increased weakness. Discussed Palliative Care and Hospice.  Patient receives social security disability.  Patient is not eligible for the [facility] due to not receiving food stamps.      Follow Up Plan:  CSW will follow-up with patient by phone     [name], LCSW Clinical Social Worker [facility]

## 2024-07-15 NOTE — Progress Notes (Signed)
 Hematology/Oncology Consult note [facility]  [phone] [phone]  Patient Care Team: Dineen Rollene MATSU, FNP as PCP - General (Family Medicine) Darliss Rogue, MD as PCP - Cardiology (Cardiology) Cindie Jesusa HERO, RN as Oncology Nurse Navigator Melanee Annah BROCKS, MD as Consulting Physician (Hematology and Oncology) Bula Powell PARAS, RN as Registered Nurse (Oncology) Borders, Fonda SAUNDERS, NP as Nurse Practitioner (Hospice and Palliative Medicine) Isadora Hose, MD as Consulting Physician (Pulmonary Disease)   Name of the patient: Tricia Berry  [id number]  03/14/1971   Date of visit: 07/15/24  Diagnosis-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  Ultrasound also showed 5 abnormal lymph nodes in the axilla with cortical thickening.  Both the mass and the lymph nodes were biopsied and was consistent with invasive mammary carcinoma grade 2.  Tumor was ER +91- 100%, PR +11 to 20% and HER-2 negative.  Ki-67 15%.    PET CT scan showed hypermetabolism in the area of the left breast but no evidence of hypermetabolism in the left axilla Or evidence of distant metastatic disease.   Neoadjuvant dose dense ACT chemotherapy started on 10/08/2020. Interim ultrasound after 4 cycles of dose dense AC chemotherapy showed overall decrease in volume of the tumor.  Nodularity previously seen on ultrasound was also decreased in size.   Patient completed neoadjuvant AC Taxol  chemotherapy and underwent bilateral mastectomy with reconstruction.Final pathology showed fibrocystic changes in the  right breast with no malignancy.  3.6 cm invasive mammary carcinoma in the left breast.  7 out of 16 lymph nodes positive for malignancy.  Treatment effect in the breast present but not robust.  Treatment effect in the lymph nodes minimal.  Margins negative.  Overall grade 2.  Extranodal extension present.  ER greater than 90% positive, PR 15% positive.  HER2 +2 equivocal and positive by FISH on the primary breast specimen.  HER2 testing was however negative in the lymph nodes.  Patient completed 1 year of adjuvant Herceptin  and Perjeta  in August 2023.  Patient could not tolerate adjuvant Verzenio  and stopped taking it. She is currently on Arimidex  alone.  She was previously on Arimidex  plus ovarian suppression which she started in June 2022.   patient sees Dr. 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  CT chest abdomen pelvis with contrast from 06/24/2024 showed extensive metastatic disease involving the liver as well as new lesions in her bones.  Liver biopsy was consistent with metastatic carcinoma of breast primary.  Tumor was ER 100% positive, PR 90% positive Ki-67 10% and HER2 negative +1    Interval history-patient has been feeling poorly especially over the last 1 week.  She has been feeling progressively weak and unable to walk more than a couple of steps when she feels that her legs give way.  Similarly she is unable to hold onto a glass or a bottle to drink water as she feels that her hands are weak and will suddenly give way.  She feels confused especially later during the day and it comes and goes.  She also reports ongoing constipation  ECOG PS- 3 Pain scale- 5 Opioid associated constipation- yes  Review of systems- Review of Systems  Constitutional:  Positive for malaise/fatigue and weight loss. Negative for chills and fever.  HENT:  Negative for congestion, ear discharge and nosebleeds.   Eyes:   Negative for blurred vision.  Respiratory:  Negative for cough, hemoptysis, sputum production, shortness of breath and wheezing.   Cardiovascular:  Negative for chest pain, palpitations, orthopnea and claudication.  Gastrointestinal:  Positive for constipation. Negative for abdominal pain, blood in stool, diarrhea, heartburn, melena, nausea and vomiting.  Genitourinary:  Negative for dysuria, flank pain, frequency, hematuria and urgency.  Musculoskeletal:  Negative for back pain, joint pain and myalgias.  Skin:  Negative for rash.  Neurological:  Positive for weakness. Negative for dizziness, tingling, focal weakness, seizures and headaches.  Endo/Heme/Allergies:  Does not bruise/bleed easily.  Psychiatric/Behavioral:  Negative for depression and suicidal ideas. The patient does not have insomnia.       Allergies  Allergen Reactions   Morphine Nausea And Vomiting and Other (See Comments)    migranes Other reaction(s): Headache Migraine, vomiting   Sertraline Hcl     REACTION: Worsened symptoms of IBS   Sulfa Antibiotics Rash   Sulfamethoxazole Rash   Sulfonamide Derivatives Rash     Past Medical History:  Diagnosis Date   Anemia    Anginal pain    excertion not cardiac related per patient   Anxiety    Breast cancer (HCC) 12/2021   left breast IMC   Diverticulitis    Dyspnea    Dysrhythmia    prolong QT   Family history of ovarian cancer    GERD (gastroesophageal reflux disease)    Headache    IBS (irritable bowel syndrome)    Neuromuscular disorder (HCC)    neuropathy from chemo   Pneumonia    PONV (postoperative nausea and vomiting)    severe migraine and vomiting post anesthesia   Scoliosis      Past Surgical History:  Procedure Laterality Date   ABDOMINAL HYSTERECTOMY     still has ovaries, no gyn cancer, hysterectomy due to endometriosis. NO cervix on exam 01/10/21   BACK SURGERY     BREAST BIOPSY Left 09/15/2020   us  bx of mass, path pending, Q marker    BREAST BIOPSY Left 09/15/2020   us  bx of LN, hydromarker, path pending   BREAST RECONSTRUCTION WITH PLACEMENT OF TISSUE EXPANDER AND FLEX HD (ACELLULAR HYDRATED DERMIS) Bilateral 03/17/2021   Procedure: IMMEDIATE BILATERAL BREAST RECONSTRUCTION WITH PLACEMENT OF TISSUE EXPANDER AND FLEX HD (ACELLULAR HYDRATED DERMIS);  Surgeon: Lowery Estefana RAMAN, DO;  Location: [facility];  Service: Plastics;  Laterality: Bilateral;   CAPSULECTOMY Right 10/18/2022   Procedure: Right breast capsule release with adjustment;  Surgeon: Lowery Estefana RAMAN, DO;  Location: [facility];  Service: Plastics;  Laterality: Right;   COLONOSCOPY WITH PROPOFOL  N/A 11/21/2021   Procedure: COLONOSCOPY WITH PROPOFOL ;  Surgeon: Unk Corinn Skiff, MD;  Location: [facility];  Service: Gastroenterology;  Laterality: N/A;   FLEXIBLE BRONCHOSCOPY Bilateral 12/01/2022   Procedure: FLEXIBLE BRONCHOSCOPY;  Surgeon: Isadora Hose, MD;  Location: [facility];  Service: Pulmonary;  Laterality: Bilateral;   HAND SURGERY Right 10/2023   IR IMAGING GUIDED PORT INSERTION  10/01/2020   MODIFIED MASTECTOMY Left 03/17/2021   Procedure: LEFT MODIFIED RADICAL MASTECTOMY;  Surgeon: Vanderbilt Ned, MD;  Location: [facility];  Service: General;  Laterality: Left;   PORTA CATH REMOVAL Right 03/17/2021  Procedure: PORTA CATH REMOVAL;  Surgeon: Vanderbilt Ned, MD;  Location: [facility];  Service: General;  Laterality: Right;   REMOVAL OF BILATERAL TISSUE EXPANDERS WITH PLACEMENT OF BILATERAL BREAST IMPLANTS Bilateral 05/09/2021   Procedure: REMOVAL OF BILATERAL TISSUE EXPANDERS WITH PLACEMENT OF BILATERAL BREAST IMPLANTS;  Surgeon: Lowery Estefana RAMAN, DO;  Location: [facility];  Service: Plastics;  Laterality: Bilateral;  90 min   saginal band repair and capsule release  10/2023   TOTAL MASTECTOMY Right 03/17/2021   Procedure: TOTAL MASTECTOMY;  Surgeon: Vanderbilt Ned,  MD;  Location: [facility];  Service: General;  Laterality: Right;    Social History   Socioeconomic History   Marital status: Significant Other    Spouse name: Not on file   Number of children: 2   Years of education: Not on file   Highest education level: Not on file  Occupational History   Occupation: Nurse    Employer: [employer]  Tobacco Use   Smoking status: Every Day    Current packs/day: 1.00    Average packs/day: 1 pack/day for 20.0 years (20.0 ttl pk-yrs)    Types: Cigarettes   Smokeless tobacco: Never   Tobacco comments:    1-2 cigarettes weekly- 09/12/2023  Vaping Use   Vaping status: Never Used  Substance and Sexual Activity   Alcohol use: Not Currently   Drug use: No   Sexual activity: Not Currently    Birth control/protection: Surgical    Comment: hyst  Other Topics Concern   Not on file  Social History Narrative   Patient works as an Insurance underwriter at [employer]. She has 2 children at home who have special needs. She and her spouse are primary caregivers.    Social Drivers of Corporate investment banker Strain: Not on file  Food Insecurity: No Food Insecurity (03/21/2023)   Hunger Vital Sign    Worried About Running Out of Food in the Last Year: Never true    Ran Out of Food in the Last Year: Never true  Transportation Needs: No Transportation Needs (03/21/2023)   PRAPARE - Administrator, Civil Service (Medical): No    Lack of Transportation (Non-Medical): No  Physical Activity: Not on file  Stress: Not on file  Social Connections: Not on file  Intimate Partner Violence: Not At Risk (03/21/2023)   Humiliation, Afraid, Rape, and Kick questionnaire    Fear of Current or Ex-Partner: No    Emotionally Abused: No    Physically Abused: No    Sexually Abused: No    Family History  Problem Relation Age of Onset   Hypertension Mother    Osteoarthritis Mother    Diverticulitis Mother    Heart failure Father    Hypertension Father     Gout Father    Heart attack Father 21   Diverticulitis Brother    Ovarian cancer Paternal Grandmother      Current Outpatient Medications:    acetaminophen  (TYLENOL ) 650 MG CR tablet, Take 1,300 mg by mouth every 8 (eight) hours as needed for pain., Disp: , Rfl:    albuterol  (PROVENTIL ) (2.5 MG/3ML) 0.083% nebulizer solution, Take 3 mLs (2.5 mg total) by nebulization every 6 (six) hours as needed for wheezing or shortness of breath., Disp: 360 mL, Rfl: 12   albuterol  (VENTOLIN  HFA) 108 (90 Base) MCG/ACT inhaler, Inhale 2 puffs into the lungs every 6 (six) hours as needed for wheezing or shortness of breath., Disp: 6.7  g, Rfl: 0   docusate sodium  (COLACE) 100 MG capsule, Take 100 mg by mouth daily as needed for mild constipation., Disp: , Rfl:    FLUoxetine  (PROZAC ) 40 MG capsule, Take 1 capsule (40 mg total) by mouth daily., Disp: 90 capsule, Rfl: 3   LORazepam  (ATIVAN ) 0.5 MG tablet, Take 1 tablet (0.5 mg total) by mouth 2 (two) times daily as needed for anxiety., Disp: 60 tablet, Rfl: 2   pregabalin  (LYRICA ) 150 MG capsule, Take 1 capsule (150 mg total) by mouth in the morning, at noon, and at bedtime., Disp: 90 capsule, Rfl: 2   ribociclib  succ (KISQALI , 400 MG DOSE,) 200 MG Therapy Pack, Take 2 tablets (400 mg total) by mouth daily. Take for 21 days on, 7 days off, repeat every 28 days., Disp: 42 tablet, Rfl: 1   trimethoprim  (TRIMPEX ) 100 MG tablet, Take 1 tablet (100 mg total) by mouth daily., Disp: 90 tablet, Rfl: 3   anastrozole  (ARIMIDEX ) 1 MG tablet, Take 1 tablet (1 mg total) by mouth daily. (Patient not taking: Reported on 07/14/2024), Disp: 30 tablet, Rfl: 5   b complex vitamins capsule, Take 1 capsule by mouth daily. (Patient not taking: Reported on 07/14/2024), Disp: , Rfl:    baclofen  (LIORESAL ) 10 MG tablet, Take 1 tablet (10 mg total) by mouth 3 (three) times daily as needed for muscle spasms. (Patient not taking: Reported on 07/14/2024), Disp: 60 tablet, Rfl: 2   calcium -vitamin D   (OSCAL WITH D) 500-5 MG-MCG tablet, Take 1 tablet by mouth daily with breakfast. (Patient not taking: Reported on 07/14/2024), Disp: , Rfl:    ciclopirox  (PENLAC ) 8 % solution, Apply over nail and surrounding skin daily. Apply daily over previous coat. After seven (7) days, remove with alcohol and continue cycle.; continue therapy until nail clearance (max duration: 48 wks) (Patient not taking: Reported on 07/14/2024), Disp: 6.6 mL, Rfl: 0   fentaNYL  (DURAGESIC ) 12 MCG/HR, Place 1 patch onto the skin every 3 (three) days., Disp: 10 patch, Rfl: 0   gentamicin  ointment (GARAMYCIN ) 0.1 %, Apply to nose 3 (three) times daily. (Patient not taking: Reported on 07/14/2024), Disp: 30 g, Rfl: 12   ibuprofen  (ADVIL ) 400 MG tablet, Take 400 mg by mouth every 6 (six) hours as needed. (Patient not taking: Reported on 07/14/2024), Disp: , Rfl:    Lactobacillus (PROBIOTIC ACIDOPHILUS PO), Take 1 capsule by mouth daily. (Patient not taking: Reported on 07/14/2024), Disp: , Rfl:    lidocaine -prilocaine  (EMLA ) cream, Apply topically. (Patient not taking: Reported on 07/14/2024), Disp: , Rfl:    naloxone  (NARCAN ) nasal spray 4 mg/0.1 mL, Place 1 spray into the nose as needed for opioid-induced respiratory depresssion. If no response within 3 minutes, repeat application in other nostril and call 911., Disp: 2 each, Rfl: 2   nicotine  (NICODERM CQ  - DOSED IN MG/24 HR) 7 mg/24hr patch, Place 1 patch (7 mg total) onto the skin daily. (Patient not taking: Reported on 07/14/2024), Disp: 28 patch, Rfl: 2   nicotine  polacrilex (NICORETTE ) 2 MG gum, Take 1 each (2 mg total) by mouth as needed for smoking cessation. (Patient not taking: Reported on 07/14/2024), Disp: 110 tablet, Rfl: 0   ondansetron  (ZOFRAN -ODT) 4 MG disintegrating tablet, Take 1 tablet (4 mg total) by mouth every 8 (eight) hours as needed for nausea or vomiting. (Patient not taking: Reported on 07/14/2024), Disp: 30 tablet, Rfl: 1   Oxycodone  HCl 10 MG TABS, Take 1 tablet (10  mg total) by mouth every 4 (four) hours  as needed for pain., Disp: 180 tablet, Rfl: 0   pravastatin  (PRAVACHOL ) 10 MG tablet, Take 1 tablet (10 mg total) by mouth daily. (Patient not taking: Reported on 07/14/2024), Disp: 90 tablet, Rfl: 3   prochlorperazine  (COMPAZINE ) 10 MG tablet, Take 1 tablet (10 mg total) by mouth every 6 (six) hours as needed., Disp: 30 tablet, Rfl: 2   terbinafine  (LAMISIL ) 1 % cream, Apply 1 Application topically 2 (two) times daily. (Patient not taking: Reported on 07/14/2024), Disp: 30 g, Rfl: 0   umeclidinium-vilanterol (ANORO ELLIPTA ) 62.5-25 MCG/ACT AEPB, Inhale 1 puff into the lungs daily. (Patient not taking: Reported on 07/14/2024), Disp: 60 each, Rfl: 6   vitamin B-12 (CYANOCOBALAMIN ) 100 MCG tablet, Take 100 mcg by mouth daily. gummy (Patient not taking: Reported on 07/14/2024), Disp: , Rfl:  No current facility-administered medications for this visit.  Facility-Administered Medications Ordered in Other Visits:    acetaminophen  (TYLENOL ) 325 MG tablet, , , ,    diphenhydrAMINE  (BENADRYL ) 25 mg capsule, , , ,    goserelin (ZOLADEX ) injection 3.6 mg, 3.6 mg, Subcutaneous, Q28 days, Melanee Annah BROCKS, MD, 3.6 mg at 02/02/23 1206   heparin  lock flush 100 unit/mL, 500 Units, Intravenous, Once, Melanee Annah BROCKS, MD   Zoledronic  Acid (ZOMETA ) 4 MG/100ML IVPB, , , ,   Physical exam:  Vitals:   07/14/24 1311  BP: 106/71  Pulse: 80  Resp: 19  Temp: (!) 97 F (36.1 C)  TempSrc: Tympanic  SpO2: 98%  Weight: 94 lb 4.8 oz (42.8 kg)  Height: 5' 2 (1.575 m)   Physical Exam Constitutional:      Comments: She is weak and cachectic.  Sitting in a wheelchair  Cardiovascular:     Rate and Rhythm: Normal rate and regular rhythm.     Heart sounds: Normal heart sounds.  Pulmonary:     Effort: Pulmonary effort is normal.     Breath sounds: Normal breath sounds.  Abdominal:     General: Bowel sounds are normal.     Palpations: Abdomen is soft.  Skin:    General: Skin is  warm and dry.  Neurological:     Mental Status: She is alert and oriented to person, place, and time.      I have personally reviewed labs listed below:    Latest Ref Rng & Units 07/14/2024    1:37 PM  CMP  Glucose 70 - 99 mg/dL 898   BUN 6 - 20 mg/dL 20   Creatinine 9.55 - 1.00 mg/dL 9.01   Sodium 864 - 854 mmol/L 130   Potassium 3.5 - 5.1 mmol/L 4.1   Chloride 98 - 111 mmol/L 98   CO2 22 - 32 mmol/L 23   Calcium  8.9 - 10.3 mg/dL 87.5   Total Protein 6.5 - 8.1 g/dL 6.1   Total Bilirubin 0.0 - 1.2 mg/dL 2.5   Alkaline Phos 38 - 126 U/L 490   AST 15 - 41 U/L 158   ALT 0 - 44 U/L 37       Latest Ref Rng & Units 07/14/2024    1:38 PM  CBC  WBC 4.0 - 10.5 K/uL 6.0   Hemoglobin 12.0 - 15.0 g/dL 88.7   Hematocrit 63.9 - 46.0 % 35.2   Platelets 150 - 400 K/uL 39    I have personally reviewed Radiology images listed below: No images are attached to the encounter.  NM Bone Scan Whole Body Result Date: 07/02/2024 EXAM: NM WHOLE BODY BONE SCAN 07/02/2024  03:40:00 PM TECHNIQUE: Anterior and posterior whole body images were obtained at least 3 hours following the intravenous administration of radiopharmaceutical. RADIOPHARMACEUTICAL: 21.91 millicurie (technetium medronate (TC-MDP) injection 20 millicurie TECHNETIUM TC 99M  MEDRONATE IV KIT) COMPARISON: CT of the chest, abdomen and pelvis with contrast 06/24/2024. CLINICAL HISTORY: Breast cancer. Malignant neoplasm of upper-inner quadrant of left female breast, unspecified estrogen receptor status. Metastasis to liver. Patient reports left breast cancer 3.5 years ago. Bilateral mastectomy with left lymph nodes removed. Bone pain right side, mid back \\T \ lower back for approximately 4 weeks. FINDINGS: BONES: Heterogeneous uptake is present in the proximal femurs, left greater than right. Heterogeneous diffuse uptake is present in the ribs. Levoconvex scoliosis is present in the lumbar spine. Uptake is present anteriorly at the L3 vertebral body.  Uptake at the shoulders, right greater than left, is likely degenerative. Diffuse uptake is present in the calvarium. SOFT TISSUES: Physiologic activity within the kidneys and urinary bladder. IMPRESSION: 1. Heterogeneous uptake in the proximal femurs, left greater than right, and diffuse uptake in the ribs and calvarium, is most consistent with treated disease. The area in the proximal left femur is most concerning for residual or recurrent active tumor. 2. Uptake at the shoulders, right greater than left, likely degenerative. 3. Uptake anteriorly at the L3 vertebral body. Electronically signed by: Lonni Necessary MD 07/02/2024 04:02 PM EDT RP Workstation: [id number]   US  BIOPSY (LIVER) Result Date: 07/01/2024 INDICATION: Breast cancer with extensive liver lesions concerning for metastatic disease. EXAM: ULTRASOUND BIOPSY CORE LIVER MEDICATIONS: None. ANESTHESIA/SEDATION: Moderate (conscious) sedation was employed during this procedure. A total of Versed  2 mg and Fentanyl  100 mcg was administered intravenously by the Radiology nurse. Moderate Sedation Time: 10 minutes. The patient's level of consciousness and vital signs were monitored continuously by radiology nursing throughout the procedure under my direct supervision. FLUOROSCOPY TIME:  None. COMPLICATIONS: None immediate. PROCEDURE: Informed written consent was obtained from the patient after a thorough discussion of the procedural risks, benefits and alternatives. All questions were addressed. Maximal Sterile Barrier Technique was utilized including caps, mask, sterile gowns, sterile gloves, sterile drape, hand hygiene and skin antiseptic. A timeout was performed prior to the initiation of the procedure. The right upper quadrant was interrogated with ultrasound. Numerous hypoechoic solid liver lesions are visible. A suitable skin entry site was selected and marked. The region was sterilely prepped and draped in the standard fashion using chlorhexidine   skin prep. Local anesthesia was attained by infiltration with 1% lidocaine . A small dermatotomy was made. Under real-time sonographic guidance, a 17 gauge introducer needle was advanced into the margin of the mass. Multiple 18 gauge biopsies were then coaxially obtained using the BioPince automated biopsy device. Biopsy samples were placed in formalin and delivered to pathology for further analysis. As a 17 gauge introducer needle was removed, the biopsy tract was embolized with a Gel-Foam slurry. Post biopsy ultrasound imaging demonstrates no evidence of active hemorrhage or perihepatic hematoma. The patient tolerated the procedure well. IMPRESSION: Technically successful ultrasound-guided core biopsy of hepatic lesion. Electronically Signed   By: Wilkie Lent M.D.   On: 07/01/2024 12:05   CT CHEST ABDOMEN PELVIS W CONTRAST Result Date: 06/24/2024 CLINICAL DATA:  Right chest and abdominal pain for 2-3 weeks with nausea. History of left breast cancer in 2021 with bilateral mastectomy, ultrasound 06/20/2024 showed solid lesions throughout the liver compatible with metastatic disease. Restaging assessment. * Tracking Code: BO * EXAM: CT CHEST, ABDOMEN, AND PELVIS WITH CONTRAST TECHNIQUE: Multidetector CT imaging of  the chest, abdomen and pelvis was performed following the standard protocol during bolus administration of intravenous contrast. RADIATION DOSE REDUCTION: This exam was performed according to the departmental dose-optimization program which includes automated exposure control, adjustment of the mA and/or kV according to patient size and/or use of iterative reconstruction technique. CONTRAST:  80mL OMNIPAQUE  IOHEXOL  300 MG/ML  SOLN COMPARISON:  Ultrasound 06/20/2024 and CT scans from 08/31/2023 and 08/21/2023 FINDINGS: CT CHEST FINDINGS Cardiovascular: Mild atheromatous vascular disease of the thoracic aorta Mediastinum/Nodes: Right paratracheal node 1.0 cm in short axis on image 18 series 2, formerly  1.2 cm. Lungs/Pleura: Scattered hazy and reticular opacities in the lungs, right greater than left, with new scattered faint and mostly ill-defined and subsolid nodularity in the lungs. Most of this is at or below 5 mm in diameter Ill-defined part solid left upper lobe nodule measuring 1.0 by 0.8 cm with the more solid component measuring 0.7 by 0.4 cm, image 39 series 3. Index right lower lobe nodule 0.5 by 0.4 cm on image 91 series 3. Subpleural reticulation anteriorly in the lungs, left greater than right, with prior radiation therapy. Underlying emphysema. Musculoskeletal: Widespread irregular bony heterogeneity in the ribs, sternum, and thoracic spine suspicious for widespread osseous metastatic disease. This is new compared to 08/21/2023. CT ABDOMEN PELVIS FINDINGS Hepatobiliary: As shown on the ultrasound from 06/20/2024, there is widespread heavy burden of targetoid enhancing lesions throughout all segments of the liver compatible with extensive hepatic metastatic disease. Index lesion in the medial segment left hepatic lobe measures 4.0 by 3.1 cm on image 55 series 2. Gallbladder unremarkable. Pancreas: Unremarkable Spleen: 0.7 cm hypodense lesion anteriorly in the spleen on image 54 series 2 is new compared to the prior exam. Metastatic lesion not excluded Adrenals/Urinary Tract: Unremarkable Stomach/Bowel: Sigmoid and distal descending colon diverticulosis. No findings of active diverticulitis. Vascular/Lymphatic: Atherosclerosis is present, including aortoiliac atherosclerotic disease. Reproductive: Uterus absent.  Adnexa unremarkable. Other: No supplemental non-categorized findings. Musculoskeletal: Dextroconvex thoracolumbar scoliosis with rotary component. Widespread bony heterogeneity compatible with extensive osseous metastatic disease, new when compared to 08/31/2023. IMPRESSION: 1. Widespread hepatic and osseous metastatic disease, new compared to 08/31/2023 and 08/21/2023. 2. New scattered faint and  mostly ill-defined and subsolid nodularity in the lungs, right greater than left, with new scattered hazy and reticular opacities in the lungs, right greater than left. This could be from atypical infection or inflammation, but metastatic disease is not excluded. 3. New 0.7 cm hypodense lesion anteriorly in the spleen, metastatic lesion not excluded. 4. Sigmoid and distal descending colon diverticulosis. 5. Dextroconvex thoracolumbar scoliosis with rotary component. 6.  Aortic Atherosclerosis (ICD10-I70.0). Electronically Signed   By: Ryan Salvage M.D.   On: 06/24/2024 16:02   US  Abdomen Limited RUQ (LIVER/GB) Result Date: 06/20/2024 CLINICAL DATA:  53 year old female with elevated liver enzymes. Brain MRI last month states metastatic right breast cancer. EXAM: ULTRASOUND ABDOMEN LIMITED RIGHT UPPER QUADRANT COMPARISON:  CT Abdomen and Pelvis 08/31/2023. FINDINGS: Gallbladder: No gallstones or wall thickening visualized. No sonographic Murphy sign noted by sonographer. Common bile duct: Diameter: 6 mm, upper limits of normal. Liver: Innumerable hypoechoic and rounded liver lesions (images 17 and 18), were not apparent by CT last year. Underlying chronic hepatic steatosis. Individual liver masses up to 5.3 cm in the right lobe, 3.8 cm in the left. Portal vein is patent on color Doppler imaging with normal direction of blood flow towards the liver. Other: Negative visible right kidney.  No free fluid. IMPRESSION: 1. Extensive Liver Metastases.  No  right upper quadrant free fluid. 2. Negative gallbladder. No evidence of bile duct obstruction. These results will be called to the ordering clinician or representative by the Radiologist Assistant, and communication documented in the PACS or [employer]. Electronically Signed   By: VEAR Hurst M.D.   On: 06/20/2024 12:18     Assessment and plan- Patient is a 53 y.o. 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  Patient also has significant thrombocytopenia with elevated nucleated red blood cells likely secondary to bone marrow involvement with breast cancer.  Today's repeat labs also revealed elevated bilirubin secondary to diffuse liver involvement with breast cancer  Patient's performance status is rapidly declining.  However given that she has ER/PR positive HER2 negative breast cancer this can be a potentially salvageable situation with CDK inhibitors plus Faslodex  and it offers a promising nonchemotherapy option which can potentially improve patient's quality of life and longevity.  Without any treatment patient's overall life expectancy likely less than 3 months.  She did have MRI brain in July 2025 which did not show any evidence of metastatic disease and I will consider repeating it sometime in the near future.  Patient is willing to consider treatment with hopes to improve her quality of life.  She will proceed with first dose of Faslodex  today and will continue to get next 3 doses of Faslodex  every 2 weeks followed by monthly.    She will also be starting Ribociclib  Tomorrow at a dose of 400 mg daily 3 weeks on and 1 week off.  Discussed risks and benefits of Ribociclib  including all but not limited to nausea vomiting low blood counts risk of infections hospitalizations abnormal LFTs and QTc prolongation.  Baseline EKG was done in the office today which was otherwise unremarkable.  Treatment will be given with a palliative intent  Patient has evidence of significant hypercalcemia and will receive 1 dose of Zometa  4 mg today along with 1 L of IV fluids.  We will bring her back again later this week for more IV fluids and repeat CMP.  Patient has met with palliative care NP Fonda Mower today and will  follow-up with him next week as well.  Neoplasm related pain: She was prescribed fentanyl  patch last week which she did not pick up.  For now she will continue with as needed oxycodone  and we will follow-up with her closely and introduce long-acting pain medication in due course.  We will reach out to pain clinic and inform them that we will be managing her pain medications at this time given the new diagnosis of advanced metastatic breast cancer.  Ribociclib  can potentially enhance opioid related side effects and therefore we are sending her prescription for Narcan  as well.  We have also sent off tissue for NGS testing  CBC with differential CMP CA 27-29 and CA 15-3 and see me in 2 weeks.  Will get Faslodex  today and in 2 weeks as per [facility].  Possible IV fluids again in 2 weeks   Visit Diagnosis 1. 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 Neoplasm related pain   3. High risk medication use   4. Use of fulvestrant  (Faslodex )   5. Hypercalcemia   6.  Goals of care, counseling/discussion      Dr. Annah Skene, MD, MPH [employer] [facility] [phone] 07/15/2024 8:21 AM

## 2024-07-16 NOTE — Addendum Note (Signed)
 Addended by: SHERLEEN FONDA SAUNDERS on: 07/16/2024 01:54 PM   Modules accepted: Orders

## 2024-07-17 ENCOUNTER — Other Ambulatory Visit: Payer: Self-pay

## 2024-07-17 ENCOUNTER — Telehealth: Payer: Self-pay

## 2024-07-17 ENCOUNTER — Inpatient Hospital Stay

## 2024-07-17 ENCOUNTER — Inpatient Hospital Stay (HOSPITAL_BASED_OUTPATIENT_CLINIC_OR_DEPARTMENT_OTHER): Admitting: Hospice and Palliative Medicine

## 2024-07-17 ENCOUNTER — Other Ambulatory Visit

## 2024-07-17 VITALS — BP 100/76 | HR 74 | Resp 18

## 2024-07-17 DIAGNOSIS — C50012 Malignant neoplasm of nipple and areola, left female breast: Secondary | ICD-10-CM

## 2024-07-17 DIAGNOSIS — C787 Secondary malignant neoplasm of liver and intrahepatic bile duct: Secondary | ICD-10-CM

## 2024-07-17 DIAGNOSIS — Z515 Encounter for palliative care: Secondary | ICD-10-CM | POA: Diagnosis not present

## 2024-07-17 DIAGNOSIS — C50212 Malignant neoplasm of upper-inner quadrant of left female breast: Secondary | ICD-10-CM | POA: Diagnosis not present

## 2024-07-17 LAB — CMP (CANCER CENTER ONLY)
ALT: 39 U/L (ref 0–44)
AST: 188 U/L (ref 15–41)
Albumin: 3.2 g/dL — ABNORMAL LOW (ref 3.5–5.0)
Alkaline Phosphatase: 514 U/L — ABNORMAL HIGH (ref 38–126)
Anion gap: 12 (ref 5–15)
BUN: 20 mg/dL (ref 6–20)
CO2: 21 mmol/L — ABNORMAL LOW (ref 22–32)
Calcium: 10 mg/dL (ref 8.9–10.3)
Chloride: 101 mmol/L (ref 98–111)
Creatinine: 0.98 mg/dL (ref 0.44–1.00)
GFR, Estimated: >60 mL/min (ref >60)
Glucose, Bld: 135 mg/dL — ABNORMAL HIGH (ref 70–99)
Potassium: 3.5 mmol/L (ref 3.5–5.1)
Sodium: 134 mmol/L — ABNORMAL LOW (ref 135–145)
Total Bilirubin: 2.9 mg/dL — ABNORMAL HIGH (ref 0.0–1.2)
Total Protein: 6.4 g/dL — ABNORMAL LOW (ref 6.5–8.1)

## 2024-07-17 LAB — CBC WITH DIFFERENTIAL (CANCER CENTER ONLY)
Abs Immature Granulocytes: 0.18 K/uL — ABNORMAL HIGH (ref 0.00–0.07)
Basophils Absolute: 0.1 K/uL (ref 0.0–0.1)
Basophils Relative: 1 %
Eosinophils Absolute: 0 K/uL (ref 0.0–0.5)
Eosinophils Relative: 0 %
HCT: 34.8 % — ABNORMAL LOW (ref 36.0–46.0)
Hemoglobin: 11.3 g/dL — ABNORMAL LOW (ref 12.0–15.0)
Immature Granulocytes: 3 %
Lymphocytes Relative: 31 %
Lymphs Abs: 2.1 K/uL (ref 0.7–4.0)
MCH: 30.1 pg (ref 26.0–34.0)
MCHC: 32.5 g/dL (ref 30.0–36.0)
MCV: 92.8 fL (ref 80.0–100.0)
Monocytes Absolute: 0.7 K/uL (ref 0.1–1.0)
Monocytes Relative: 10 %
Neutro Abs: 3.9 K/uL (ref 1.7–7.7)
Neutrophils Relative %: 55 %
Platelet Count: 49 K/uL — ABNORMAL LOW (ref 150–400)
RBC: 3.75 MIL/uL — ABNORMAL LOW (ref 3.87–5.11)
RDW: 22.3 % — ABNORMAL HIGH (ref 11.5–15.5)
WBC Count: 6.9 K/uL (ref 4.0–10.5)
nRBC: 5.5 % — ABNORMAL HIGH (ref 0.0–0.2)

## 2024-07-17 MED ORDER — SODIUM CHLORIDE 0.9 % IV SOLN
INTRAVENOUS | Status: AC
  Filled 2024-07-17 (×2): qty 250

## 2024-07-17 MED ORDER — DEXAMETHASONE SODIUM PHOSPHATE 10 MG/ML IJ SOLN
6.0000 mg | Freq: Once | INTRAMUSCULAR | Status: AC
  Administered 2024-07-17: 6 mg via INTRAVENOUS
  Filled 2024-07-17: qty 1

## 2024-07-17 MED ORDER — DEXAMETHASONE 2 MG PO TABS
2.0000 mg | ORAL_TABLET | Freq: Every day | ORAL | 0 refills | Status: DC
  Filled 2024-07-17: qty 10, 10d supply, fill #0

## 2024-07-17 NOTE — Progress Notes (Signed)
 Nutrition  Spoke with Palliative care and nursing and not appropriate for nutrition visit today.   Andersyn Fragoso B. Dasie SOLON, CSO, LDN Registered Dietitian [phone]

## 2024-07-17 NOTE — Telephone Encounter (Signed)
 Noted

## 2024-07-17 NOTE — Progress Notes (Signed)
 Started feeling worse after eating breakfast. Diarrhea - took imodium. More disoriented as the morning goes on. Darker urine today. Pain all over pretty much but can't give me a number. Sleeping a lot. Patient states that it burns when she pees for 4 weeks.

## 2024-07-17 NOTE — Telephone Encounter (Signed)
 Spoke to [place] who confirmed XT should result between 9/26 - 10/1.

## 2024-07-17 NOTE — Telephone Encounter (Signed)
 Copied from CRM [id number]. Topic: General - Other >> Jul 17, 2024 11:19 AM Revonda D wrote: Reason for CRM: Kristin with [facility] wanted to inform Rollene Arnett,FNP, that they are following the pt for platelet care. CB [phone].

## 2024-07-17 NOTE — Progress Notes (Signed)
 Palliative Medicine [facility] at [facility] Telephone:([phone] [phone]   Name: Tricia Berry Date: 07/17/2024 MRN: [id number]  DOB: [birth year]  Patient Care Team: Dineen Rollene KANDICE, FNP as PCP - General (Family Medicine) Darliss Rogue, MD as PCP - Cardiology (Cardiology) Cindie Jesusa HERO, RN as Oncology Nurse Navigator Melanee Annah BROCKS, MD as Consulting Physician (Hematology and Oncology) Bula Powell PARAS, RN as Registered Nurse (Oncology) Vania Rosero, Fonda SAUNDERS, NP as Nurse Practitioner (Hospice and Palliative Medicine) Isadora Hose, MD as Consulting Physician (Pulmonary Disease)    REASON FOR CONSULTATION: Tricia Berry is a 53 y.o. 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  Patient has had significant weakness and pain.  She is referred to palliative care to address goals of manage ongoing symptoms.  SOCIAL HISTORY:     reports that she has been smoking cigarettes. She has a 20 pack-year smoking history. She has never used smokeless tobacco. She reports that she does not currently use alcohol. She reports that she does not use drugs.  Patient lives at home with her significant other and 2 adult sons.  Patient previously worked as an Insurance underwriter at [employer].  ADVANCE DIRECTIVES:  On file  CODE STATUS: DNR/DNI (DNR signed on 07/17/24)  PAST MEDICAL HISTORY: Past Medical History:  Diagnosis Date   Anemia    Anginal pain    excertion not cardiac related per patient   Anxiety    Breast cancer (HCC) 12/2021   left breast IMC   Diverticulitis    Dyspnea    Dysrhythmia    prolong QT   Family history of ovarian cancer    GERD (gastroesophageal reflux disease)    Headache    IBS (irritable bowel syndrome)    Neuromuscular disorder (HCC)    neuropathy from chemo   Pneumonia    PONV (postoperative nausea and vomiting)    severe migraine and  vomiting post anesthesia   Scoliosis     PAST SURGICAL HISTORY:  Past Surgical History:  Procedure Laterality Date   ABDOMINAL HYSTERECTOMY     still has ovaries, no gyn cancer, hysterectomy due to endometriosis. NO cervix on exam 01/10/21   BACK SURGERY     BREAST BIOPSY Left 09/15/2020   us  bx of mass, path pending, Q marker   BREAST BIOPSY Left 09/15/2020   us  bx of LN, hydromarker, path pending   BREAST RECONSTRUCTION WITH PLACEMENT OF TISSUE EXPANDER AND FLEX HD (ACELLULAR HYDRATED DERMIS) Bilateral 03/17/2021   Procedure: IMMEDIATE BILATERAL BREAST RECONSTRUCTION WITH PLACEMENT OF TISSUE EXPANDER AND FLEX HD (ACELLULAR HYDRATED DERMIS);  Surgeon: Lowery Estefana RAMAN, DO;  Location: [facility];  Service: Plastics;  Laterality: Bilateral;   CAPSULECTOMY Right 10/18/2022   Procedure: Right breast capsule release with adjustment;  Surgeon: Lowery Estefana RAMAN, DO;  Location: [facility];  Service: Plastics;  Laterality: Right;   COLONOSCOPY WITH PROPOFOL  N/A 11/21/2021   Procedure: COLONOSCOPY WITH PROPOFOL ;  Surgeon: Unk Corinn Skiff, MD;  Location: [facility];  Service: Gastroenterology;  Laterality: N/A;   FLEXIBLE BRONCHOSCOPY Bilateral 12/01/2022   Procedure: FLEXIBLE BRONCHOSCOPY;  Surgeon: Isadora Hose, MD;  Location: [facility];  Service: Pulmonary;  Laterality: Bilateral;   HAND SURGERY Right 10/2023   IR IMAGING GUIDED PORT INSERTION  10/01/2020   MODIFIED MASTECTOMY Left 03/17/2021   Procedure: LEFT MODIFIED RADICAL MASTECTOMY;  Surgeon: Vanderbilt Ned, MD;  Location: [facility]  [place] [facility];  Service: General;  Laterality: Left;   PORTA CATH REMOVAL Right 03/17/2021   Procedure: PORTA CATH REMOVAL;  Surgeon: Vanderbilt Ned, MD;  Location: [facility];  Service: General;  Laterality: Right;   REMOVAL OF BILATERAL TISSUE EXPANDERS WITH PLACEMENT OF BILATERAL BREAST IMPLANTS Bilateral 05/09/2021   Procedure: REMOVAL OF  BILATERAL TISSUE EXPANDERS WITH PLACEMENT OF BILATERAL BREAST IMPLANTS;  Surgeon: Lowery Estefana RAMAN, DO;  Location: [facility];  Service: Plastics;  Laterality: Bilateral;  90 min   saginal band repair and capsule release  10/2023   TOTAL MASTECTOMY Right 03/17/2021   Procedure: TOTAL MASTECTOMY;  Surgeon: Vanderbilt Ned, MD;  Location: [facility];  Service: General;  Laterality: Right;    HEMATOLOGY/ONCOLOGY HISTORY:  Oncology History  Malignant neoplasm of left female breast (HCC)  09/26/2020 Initial Diagnosis   Malignant neoplasm of upper-outer quadrant of left breast in female, estrogen receptor positive (HCC)   10/08/2020 - 02/18/2021 Chemotherapy         10/08/2020 Cancer Staging   Staging form: Breast, AJCC 8th Edition - Clinical stage from 10/08/2020: Stage IIIB (cT4, cN1, cM0, G2, ER+, PR+, HER2-) - Signed by Melanee Annah BROCKS, MD on 10/08/2020    Genetic Testing   Negative genetic testing. No pathogenic variants identified on the [facility] Common Hereditary Cancers Panel. The report date is 11/18/2020.  The Multi-Cancer Panel offered by [facility] includes sequencing and/or deletion duplication testing of the following 84 genes: AIP, ALK, APC, ATM, AXIN2,BAP1,  BARD1, BLM, BMPR1A, BRCA1, BRCA2, BRIP1, CASR, CDC73, CDH1, CDK4, CDKN1B, CDKN1C, CDKN2A (p14ARF), CDKN2A (p16INK4a), CEBPA, CHEK2, CTNNA1, DICER1, DIS3L2, EGFR (c.2369C>T, p.Thr790Met variant only), EPCAM (Deletion/duplication testing only), FH, FLCN, GATA2, GPC3, GREM1 (Promoter region deletion/duplication testing only), HOXB13 (c.251G>A, p.Gly84Glu), HRAS, KIT, MAX, MEN1, MET, MITF (c.952G>A, p.Glu318Lys variant only), MLH1, MSH2, MSH3, MSH6, MUTYH, NBN, NF1, NF2, NTHL1, PALB2, PDGFRA, PHOX2B, PMS2, POLD1, POLE, POT1, PRKAR1A, PTCH1, PTEN, RAD50, RAD51C, RAD51D, RB1, RECQL4, RET,  RUNX1, SDHAF2, SDHA (sequence changes only), SDHB, SDHC, SDHD, SMAD4, SMARCA4, SMARCB1, SMARCE1, STK11, SUFU, TERC, TERT,  TMEM127, TP53, TSC1, TSC2, VHL, WRN and WT1.    04/11/2021 Cancer Staging   Staging form: Breast, AJCC 8th Edition - Pathologic stage from 04/11/2021: No Stage Recommended (ypT2, pN2a, cM0, G2, ER+, PR+, HER2+) - Signed by Melanee Annah BROCKS, MD on 04/11/2021 Stage prefix: Post-therapy Histologic grading system: 3 grade system   04/27/2021 - 06/10/2021 Chemotherapy         07/01/2021 - 06/07/2022 Chemotherapy   Patient is on Treatment Plan : BREAST Weekly Paclitaxel  + Trastuzumab  + Pertuzumab  q21d x 8 cycles / Trastuzumab  + Pertuzumab  q21d x 4 cycles     06/28/2022 - 07/19/2022 Chemotherapy   Patient is on Treatment Plan : BREAST Trastuzumab   + Pertuzumab  q21d x 13 cycles     07/14/2024 -  Chemotherapy   Patient is on Treatment Plan : BREAST Fulvestrant  q28d       ALLERGIES:  is allergic to morphine, sertraline hcl, sulfa antibiotics, sulfamethoxazole, and sulfonamide derivatives.  MEDICATIONS:  Current Outpatient Medications  Medication Sig Dispense Refill   acetaminophen  (TYLENOL ) 650 MG CR tablet Take 1,300 mg by mouth every 8 (eight) hours as needed for pain.     albuterol  (PROVENTIL ) (2.5 MG/3ML) 0.083% nebulizer solution Take 3 mLs (2.5 mg total) by nebulization every 6 (six) hours as needed for wheezing or shortness of breath. 360 mL 12   albuterol  (VENTOLIN  HFA) 108 (90 Base) MCG/ACT inhaler Inhale  2 puffs into the lungs every 6 (six) hours as needed for wheezing or shortness of breath. 6.7 g 0   FLUoxetine  (PROZAC ) 40 MG capsule Take 1 capsule (40 mg total) by mouth daily. 90 capsule 3   loperamide (IMODIUM) 2 MG capsule Take by mouth as needed for diarrhea or loose stools.     LORazepam  (ATIVAN ) 0.5 MG tablet Take 1 tablet (0.5 mg total) by mouth 2 (two) times daily as needed for anxiety. 60 tablet 2   ondansetron  (ZOFRAN -ODT) 4 MG disintegrating tablet Take 1 tablet (4 mg total) by mouth every 8 (eight) hours as needed for nausea or vomiting. 30 tablet 1   Oxycodone  HCl 10 MG TABS Take  1 tablet (10 mg total) by mouth every 4 (four) hours as needed for pain. 180 tablet 0   pregabalin  (LYRICA ) 150 MG capsule Take 1 capsule (150 mg total) by mouth in the morning, at noon, and at bedtime. 90 capsule 2   prochlorperazine  (COMPAZINE ) 10 MG tablet Take 1 tablet (10 mg total) by mouth every 6 (six) hours as needed. 30 tablet 2   ribociclib  succ (KISQALI , 400 MG DOSE,) 200 MG Therapy Pack Take 2 tablets (400 mg total) by mouth daily. Take for 21 days on, 7 days off, repeat every 28 days. 42 tablet 1   trimethoprim  (TRIMPEX ) 100 MG tablet Take 1 tablet (100 mg total) by mouth daily. 90 tablet 3   umeclidinium-vilanterol (ANORO ELLIPTA ) 62.5-25 MCG/ACT AEPB Inhale 1 puff into the lungs daily. 60 each 6   anastrozole  (ARIMIDEX ) 1 MG tablet Take 1 tablet (1 mg total) by mouth daily. (Patient not taking: Reported on 07/17/2024) 30 tablet 5   b complex vitamins capsule Take 1 capsule by mouth daily. (Patient not taking: Reported on 07/17/2024)     baclofen  (LIORESAL ) 10 MG tablet Take 1 tablet (10 mg total) by mouth 3 (three) times daily as needed for muscle spasms. (Patient not taking: Reported on 07/17/2024) 60 tablet 2   calcium -vitamin D  (OSCAL WITH D) 500-5 MG-MCG tablet Take 1 tablet by mouth daily with breakfast. (Patient not taking: Reported on 07/17/2024)     ciclopirox  (PENLAC ) 8 % solution Apply over nail and surrounding skin daily. Apply daily over previous coat. After seven (7) days, remove with alcohol and continue cycle.; continue therapy until nail clearance (max duration: 48 wks) (Patient not taking: Reported on 07/17/2024) 6.6 mL 0   docusate sodium  (COLACE) 100 MG capsule Take 100 mg by mouth daily as needed for mild constipation. (Patient not taking: Reported on 07/17/2024)     fentaNYL  (DURAGESIC ) 12 MCG/HR Place 1 patch onto the skin every 3 (three) days. (Patient not taking: Reported on 07/17/2024) 10 patch 0   gentamicin  ointment (GARAMYCIN ) 0.1 % Apply to nose 3 (three) times  daily. (Patient not taking: Reported on 07/17/2024) 30 g 12   ibuprofen  (ADVIL ) 400 MG tablet Take 400 mg by mouth every 6 (six) hours as needed. (Patient not taking: Reported on 07/17/2024)     Lactobacillus (PROBIOTIC ACIDOPHILUS PO) Take 1 capsule by mouth daily. (Patient not taking: Reported on 07/17/2024)     lidocaine -prilocaine  (EMLA ) cream Apply topically. (Patient not taking: Reported on 07/17/2024)     naloxone  (NARCAN ) nasal spray 4 mg/0.1 mL Place 1 spray into the nose as needed for opioid-induced respiratory depresssion. If no response within 3 minutes, repeat application in other nostril and call 911. (Patient not taking: Reported on 07/17/2024) 2 each 2   nicotine  (NICODERM CQ  -  DOSED IN MG/24 HR) 7 mg/24hr patch Place 1 patch (7 mg total) onto the skin daily. (Patient not taking: Reported on 07/17/2024) 28 patch 2   nicotine  polacrilex (NICORETTE ) 2 MG gum Take 1 each (2 mg total) by mouth as needed for smoking cessation. (Patient not taking: Reported on 07/17/2024) 110 tablet 0   pravastatin  (PRAVACHOL ) 10 MG tablet Take 1 tablet (10 mg total) by mouth daily. (Patient not taking: Reported on 07/17/2024) 90 tablet 3   terbinafine  (LAMISIL ) 1 % cream Apply 1 Application topically 2 (two) times daily. (Patient not taking: Reported on 07/17/2024) 30 g 0   vitamin B-12 (CYANOCOBALAMIN ) 100 MCG tablet Take 100 mcg by mouth daily. gummy (Patient not taking: Reported on 07/17/2024)     No current facility-administered medications for this visit.   Facility-Administered Medications Ordered in Other Visits  Medication Dose Route Frequency Provider Last Rate Last Admin   0.9 %  sodium chloride  infusion   Intravenous Continuous Avalyn Molino, Fonda SAUNDERS, NP 999 mL/hr at 07/17/24 1228 New Bag at 07/17/24 1228   acetaminophen  (TYLENOL ) 325 MG tablet            diphenhydrAMINE  (BENADRYL ) 25 mg capsule            goserelin (ZOLADEX ) injection 3.6 mg  3.6 mg Subcutaneous Q28 days Rao, Archana C, MD   3.6 mg at  02/02/23 1206   heparin  lock flush 100 unit/mL  500 Units Intravenous Once Rao, Archana C, MD       Zoledronic  Acid (ZOMETA ) 4 MG/100ML IVPB             VITAL SIGNS: BP 100/76 (BP Location: Right Arm, Patient Position: Sitting)   Pulse 74   Resp 18   SpO2 96%  Filed Weights    Estimated body mass index is 17.25 kg/m as calculated from the following:   Height as of 07/14/24: 5' 2 (1.575 m).   Weight as of 07/14/24: 94 lb 4.8 oz (42.8 kg).  LABS: CBC:    Component Value Date/Time   WBC 6.9 07/17/2024 1039   WBC 6.3 07/01/2024 0945   HGB 11.3 (L) 07/17/2024 1039   HGB 14.3 08/23/2020 1033   HCT 34.8 (L) 07/17/2024 1039   HCT 42.9 08/23/2020 1033   PLT 49 (L) 07/17/2024 1039   PLT 222 08/23/2020 1033   MCV 92.8 07/17/2024 1039   MCV 91 08/23/2020 1033   NEUTROABS 3.9 07/17/2024 1039   NEUTROABS 2.7 08/23/2020 1033   LYMPHSABS 2.1 07/17/2024 1039   LYMPHSABS 4.2 (H) 08/23/2020 1033   MONOABS 0.7 07/17/2024 1039   EOSABS 0.0 07/17/2024 1039   EOSABS 0.1 08/23/2020 1033   BASOSABS 0.1 07/17/2024 1039   BASOSABS 0.1 08/23/2020 1033   Comprehensive Metabolic Panel:    Component Value Date/Time   NA 134 (L) 07/17/2024 1039   K 3.5 07/17/2024 1039   CL 101 07/17/2024 1039   CO2 21 (L) 07/17/2024 1039   BUN 20 07/17/2024 1039   CREATININE 0.98 07/17/2024 1039   GLUCOSE 135 (H) 07/17/2024 1039   CALCIUM  10.0 07/17/2024 1039   AST 188 (HH) 07/17/2024 1039   ALT 39 07/17/2024 1039   ALKPHOS 514 (H) 07/17/2024 1039   BILITOT 2.9 (H) 07/17/2024 1039   PROT 6.4 (L) 07/17/2024 1039   ALBUMIN 3.2 (L) 07/17/2024 1039    RADIOGRAPHIC STUDIES: NM Bone Scan Whole Body Result Date: 07/02/2024 EXAM: NM WHOLE BODY BONE SCAN 07/02/2024 03:40:00 PM TECHNIQUE: Anterior and posterior whole body images  were obtained at least 3 hours following the intravenous administration of radiopharmaceutical. RADIOPHARMACEUTICAL: 21.91 millicurie (technetium medronate (TC-MDP) injection 20 millicurie  TECHNETIUM TC 99M  MEDRONATE IV KIT) COMPARISON: CT of the chest, abdomen and pelvis with contrast 06/24/2024. CLINICAL HISTORY: Breast cancer. Malignant neoplasm of upper-inner quadrant of left female breast, unspecified estrogen receptor status. Metastasis to liver. Patient reports left breast cancer 3.5 years ago. Bilateral mastectomy with left lymph nodes removed. Bone pain right side, mid back \\T \ lower back for approximately 4 weeks. FINDINGS: BONES: Heterogeneous uptake is present in the proximal femurs, left greater than right. Heterogeneous diffuse uptake is present in the ribs. Levoconvex scoliosis is present in the lumbar spine. Uptake is present anteriorly at the L3 vertebral body. Uptake at the shoulders, right greater than left, is likely degenerative. Diffuse uptake is present in the calvarium. SOFT TISSUES: Physiologic activity within the kidneys and urinary bladder. IMPRESSION: 1. Heterogeneous uptake in the proximal femurs, left greater than right, and diffuse uptake in the ribs and calvarium, is most consistent with treated disease. The area in the proximal left femur is most concerning for residual or recurrent active tumor. 2. Uptake at the shoulders, right greater than left, likely degenerative. 3. Uptake anteriorly at the L3 vertebral body. Electronically signed by: Lonni Necessary MD 07/02/2024 04:02 PM EDT RP Workstation: [id number]   US  BIOPSY (LIVER) Result Date: 07/01/2024 INDICATION: Breast cancer with extensive liver lesions concerning for metastatic disease. EXAM: ULTRASOUND BIOPSY CORE LIVER MEDICATIONS: None. ANESTHESIA/SEDATION: Moderate (conscious) sedation was employed during this procedure. A total of Versed  2 mg and Fentanyl  100 mcg was administered intravenously by the Radiology nurse. Moderate Sedation Time: 10 minutes. The patient's level of consciousness and vital signs were monitored continuously by radiology nursing throughout the procedure under my direct  supervision. FLUOROSCOPY TIME:  None. COMPLICATIONS: None immediate. PROCEDURE: Informed written consent was obtained from the patient after a thorough discussion of the procedural risks, benefits and alternatives. All questions were addressed. Maximal Sterile Barrier Technique was utilized including caps, mask, sterile gowns, sterile gloves, sterile drape, hand hygiene and skin antiseptic. A timeout was performed prior to the initiation of the procedure. The right upper quadrant was interrogated with ultrasound. Numerous hypoechoic solid liver lesions are visible. A suitable skin entry site was selected and marked. The region was sterilely prepped and draped in the standard fashion using chlorhexidine  skin prep. Local anesthesia was attained by infiltration with 1% lidocaine . A small dermatotomy was made. Under real-time sonographic guidance, a 17 gauge introducer needle was advanced into the margin of the mass. Multiple 18 gauge biopsies were then coaxially obtained using the BioPince automated biopsy device. Biopsy samples were placed in formalin and delivered to pathology for further analysis. As a 17 gauge introducer needle was removed, the biopsy tract was embolized with a Gel-Foam slurry. Post biopsy ultrasound imaging demonstrates no evidence of active hemorrhage or perihepatic hematoma. The patient tolerated the procedure well. IMPRESSION: Technically successful ultrasound-guided core biopsy of hepatic lesion. Electronically Signed   By: Wilkie Lent M.D.   On: 07/01/2024 12:05   CT CHEST ABDOMEN PELVIS W CONTRAST Result Date: 06/24/2024 CLINICAL DATA:  Right chest and abdominal pain for 2-3 weeks with nausea. History of left breast cancer in 2021 with bilateral mastectomy, ultrasound 06/20/2024 showed solid lesions throughout the liver compatible with metastatic disease. Restaging assessment. * Tracking Code: BO * EXAM: CT CHEST, ABDOMEN, AND PELVIS WITH CONTRAST TECHNIQUE: Multidetector CT imaging  of the chest, abdomen and pelvis was performed following  the standard protocol during bolus administration of intravenous contrast. RADIATION DOSE REDUCTION: This exam was performed according to the departmental dose-optimization program which includes automated exposure control, adjustment of the mA and/or kV according to patient size and/or use of iterative reconstruction technique. CONTRAST:  80mL OMNIPAQUE  IOHEXOL  300 MG/ML  SOLN COMPARISON:  Ultrasound 06/20/2024 and CT scans from 08/31/2023 and 08/21/2023 FINDINGS: CT CHEST FINDINGS Cardiovascular: Mild atheromatous vascular disease of the thoracic aorta Mediastinum/Nodes: Right paratracheal node 1.0 cm in short axis on image 18 series 2, formerly 1.2 cm. Lungs/Pleura: Scattered hazy and reticular opacities in the lungs, right greater than left, with new scattered faint and mostly ill-defined and subsolid nodularity in the lungs. Most of this is at or below 5 mm in diameter Ill-defined part solid left upper lobe nodule measuring 1.0 by 0.8 cm with the more solid component measuring 0.7 by 0.4 cm, image 39 series 3. Index right lower lobe nodule 0.5 by 0.4 cm on image 91 series 3. Subpleural reticulation anteriorly in the lungs, left greater than right, with prior radiation therapy. Underlying emphysema. Musculoskeletal: Widespread irregular bony heterogeneity in the ribs, sternum, and thoracic spine suspicious for widespread osseous metastatic disease. This is new compared to 08/21/2023. CT ABDOMEN PELVIS FINDINGS Hepatobiliary: As shown on the ultrasound from 06/20/2024, there is widespread heavy burden of targetoid enhancing lesions throughout all segments of the liver compatible with extensive hepatic metastatic disease. Index lesion in the medial segment left hepatic lobe measures 4.0 by 3.1 cm on image 55 series 2. Gallbladder unremarkable. Pancreas: Unremarkable Spleen: 0.7 cm hypodense lesion anteriorly in the spleen on image 54 series 2 is new compared to  the prior exam. Metastatic lesion not excluded Adrenals/Urinary Tract: Unremarkable Stomach/Bowel: Sigmoid and distal descending colon diverticulosis. No findings of active diverticulitis. Vascular/Lymphatic: Atherosclerosis is present, including aortoiliac atherosclerotic disease. Reproductive: Uterus absent.  Adnexa unremarkable. Other: No supplemental non-categorized findings. Musculoskeletal: Dextroconvex thoracolumbar scoliosis with rotary component. Widespread bony heterogeneity compatible with extensive osseous metastatic disease, new when compared to 08/31/2023. IMPRESSION: 1. Widespread hepatic and osseous metastatic disease, new compared to 08/31/2023 and 08/21/2023. 2. New scattered faint and mostly ill-defined and subsolid nodularity in the lungs, right greater than left, with new scattered hazy and reticular opacities in the lungs, right greater than left. This could be from atypical infection or inflammation, but metastatic disease is not excluded. 3. New 0.7 cm hypodense lesion anteriorly in the spleen, metastatic lesion not excluded. 4. Sigmoid and distal descending colon diverticulosis. 5. Dextroconvex thoracolumbar scoliosis with rotary component. 6.  Aortic Atherosclerosis (ICD10-I70.0). Electronically Signed   By: Ryan Salvage M.D.   On: 06/24/2024 16:02   US  Abdomen Limited RUQ (LIVER/GB) Result Date: 06/20/2024 CLINICAL DATA:  53 year old female with elevated liver enzymes. Brain MRI last month states metastatic right breast cancer. EXAM: ULTRASOUND ABDOMEN LIMITED RIGHT UPPER QUADRANT COMPARISON:  CT Abdomen and Pelvis 08/31/2023. FINDINGS: Gallbladder: No gallstones or wall thickening visualized. No sonographic Murphy sign noted by sonographer. Common bile duct: Diameter: 6 mm, upper limits of normal. Liver: Innumerable hypoechoic and rounded liver lesions (images 17 and 18), were not apparent by CT last year. Underlying chronic hepatic steatosis. Individual liver masses up to 5.3 cm  in the right lobe, 3.8 cm in the left. Portal vein is patent on color Doppler imaging with normal direction of blood flow towards the liver. Other: Negative visible right kidney.  No free fluid. IMPRESSION: 1. Extensive Liver Metastases.  No right upper quadrant free fluid. 2. Negative gallbladder.  No evidence of bile duct obstruction. These results will be called to the ordering clinician or representative by the Radiologist Assistant, and communication documented in the PACS or [employer]. Electronically Signed   By: VEAR Hurst M.D.   On: 06/20/2024 12:18    PERFORMANCE STATUS (ECOG) : 3 - Symptomatic, >50% confined to bed  Review of Systems Unless otherwise noted, a complete review of systems is negative.  Physical Exam General: Thin, frail-appearing Pulmonary: Unlabored Abdomen: soft, nontender, + bowel sounds GU: no suprapubic tenderness Extremities: no edema, no joint deformities Skin: no rashes Neurological: Weakness, lethargy  IMPRESSION: Follow-up visit.  Patient accompanied by her significant other/healthcare power of attorney, Camellia.  Patient doing poorly.  Camellia reports that patient is sleeping 18 to 20 hours a day.  Did not have significant improvement with supportive care earlier in the week.  Performance status remains poor.  No fever or chills.  Oral intake remains poor.  Patient did have a single episode of loose stools but that improved after taking Imodium.  Suspect this is secondary to cancer treatment.  Patient lethargic.  Transaminases and bilirubin rising and could be contributing to her encephalopathy.  Calcium  has normalized after Zometa .    Patient unable to engage meaningfully in a conversation regarding goals.  She did wake up enough to state DNR when asked about CODE STATUS.    I had a long conversation with her significant other about patient's risk of further decline.  Is possible that patient is rapidly nearing end-of-life.  We discussed options of  hospitalization versus transition to hospice care versus ongoing supportive care/cancer treatment.  Significant other asks that we proceed with supportive care today and he would like to take a few days to see if he can talk with patient regarding her goals.  Will plan follow-up early next week.  Camellia did state that he is in agreement with DNR/DNI.  PLAN: - Continue current scope of treatment - IV fluids and dexamethasone  today - Start oral dexamethasone  to see if this improves fatigue/appetite - Check urinalysis - DNR/DNI - RTC next week  Case and plan discussed with Dr. Melanee  Patient expressed understanding and was in agreement with this plan. She also understands that She can call the clinic at any time with any questions, concerns, or complaints.     Time Total: 45 minutes  Visit consisted of counseling and education dealing with the complex and emotionally intense issues of symptom management and palliative care in the setting of serious and potentially life-threatening illness.Greater than 50%  of this time was spent counseling and coordinating care related to the above assessment and plan.  Signed by: Fonda Mower, PhD, NP-C

## 2024-07-18 ENCOUNTER — Inpatient Hospital Stay

## 2024-07-18 NOTE — Progress Notes (Signed)
 [facility] CSW Progress Note  Clinical Child psychotherapist contacted caregiver by phone to follow-up on emotional support.    Interventions: Provided brief mental health counseling with regard to patient's decline.  He stated Palliative Care is scheduled to contact him on Monday.       Follow Up Plan:  CSW will follow-up with patient by phone     Tricia CHRISTELLA Au, LCSW Clinical Social Worker [facility]

## 2024-07-21 ENCOUNTER — Inpatient Hospital Stay

## 2024-07-21 ENCOUNTER — Encounter: Payer: Self-pay | Admitting: Hospice and Palliative Medicine

## 2024-07-21 ENCOUNTER — Other Ambulatory Visit: Payer: Self-pay | Admitting: Hospice and Palliative Medicine

## 2024-07-21 ENCOUNTER — Telehealth: Payer: Self-pay | Admitting: Hospice and Palliative Medicine

## 2024-07-21 ENCOUNTER — Inpatient Hospital Stay: Admitting: Hospice and Palliative Medicine

## 2024-07-21 ENCOUNTER — Other Ambulatory Visit: Payer: Self-pay

## 2024-07-21 DIAGNOSIS — C787 Secondary malignant neoplasm of liver and intrahepatic bile duct: Secondary | ICD-10-CM

## 2024-07-21 MED ORDER — METHADONE HCL 5 MG PO TABS
5.0000 mg | ORAL_TABLET | Freq: Three times a day (TID) | ORAL | 0 refills | Status: DC
  Filled 2024-07-21: qty 90, 30d supply, fill #0

## 2024-07-21 NOTE — Telephone Encounter (Signed)
 Patients caregiver called to cancel appointments for today. He states she is not physically or mentally able to come in today. He is requesting a call form Josh or a member of her care team.

## 2024-07-21 NOTE — Progress Notes (Signed)
 We spoke with the patient's significant other and the patient was enrolled in services.  Due to the patient's recent decline, he is unsure if she will be able to engage in services. Currently the patient is scheduled for an initial behavioral health evaluation on 07/25/2024.

## 2024-07-21 NOTE — Progress Notes (Signed)
 I spoke with patient's significant other, Eric.  He reports that patient is rapidly declining.  Sleeping 20+ hours a day.  Oral intake is minimal.  She is only intaking sips and bites.  She had a recent fall.  We discussed option of EMS/hospitalization versus just focusing on her comfort and likely end-of-life care.  He is struggling with care at home and we also talked about possible option of moving her to an inpatient hospice facility.  Eric request hospice involvement.  Will coordinate referral.

## 2024-07-21 NOTE — Telephone Encounter (Signed)
 Josh calling patient's family now.

## 2024-07-22 ENCOUNTER — Encounter: Payer: Self-pay | Admitting: Oncology

## 2024-07-22 NOTE — Telephone Encounter (Signed)
 [facility] resulted today 07/22/24.

## 2024-07-28 ENCOUNTER — Ambulatory Visit

## 2024-07-28 ENCOUNTER — Other Ambulatory Visit

## 2024-07-28 ENCOUNTER — Ambulatory Visit: Admitting: Oncology

## 2024-07-29 ENCOUNTER — Inpatient Hospital Stay: Admitting: Oncology

## 2024-07-29 ENCOUNTER — Inpatient Hospital Stay

## 2024-07-29 ENCOUNTER — Inpatient Hospital Stay: Attending: Oncology

## 2024-07-30 ENCOUNTER — Inpatient Hospital Stay: Admitting: Occupational Therapy

## 2024-07-30 ENCOUNTER — Telehealth: Payer: Self-pay

## 2024-07-30 NOTE — Telephone Encounter (Signed)
 Voicemail received from Camellia Sharps today 07/30/24 at 8:27am indicating patient missed appointment yesterday as will miss the one today. She is not able to travel; she has put under hospice care as of Mon 9/29.  Not sure if able to make future appointments at this time. Feel free to call or send a my chart message if you have any questions, she is doing okay at this time. Secure chat sent notifying of the above.

## 2024-07-31 ENCOUNTER — Other Ambulatory Visit: Payer: Self-pay

## 2024-07-31 NOTE — Progress Notes (Signed)
 Patient transitioned to hospice, per [place] ([employer]) ok to disenroll.

## 2024-08-04 ENCOUNTER — Encounter: Payer: Self-pay | Admitting: Oncology

## 2024-08-04 ENCOUNTER — Other Ambulatory Visit: Payer: Self-pay

## 2024-08-04 MED ORDER — CIPROFLOXACIN HCL 500 MG PO TABS
500.0000 mg | ORAL_TABLET | Freq: Two times a day (BID) | ORAL | 0 refills | Status: AC
  Filled 2024-08-04: qty 14, 7d supply, fill #0

## 2024-08-11 ENCOUNTER — Inpatient Hospital Stay

## 2024-08-13 NOTE — Progress Notes (Signed)
 Discharge Plan Member has been discharged from [facility]. Please see below Discharge Plan for details. The Member has been provided a copy of their Discharge Plan via the [facility] Care portal. If you have any questions, please contact our Clinical Team at [phone].  Name: Tricia Berry Date of Birth:[birth year] Discharged Reason: No longer eligible: Left Contracted Provider Panel  Referring Provider:Josh Borders Psychiatric Medication Recommendation Transition: N/A (no CC med recs made) Referrals:No referrals Discharge Summary: Member was scheduled for an initial behavioral health evaluation but the visit was canceled due to Member transferring to hospice care.  [place] Care Provider: Kedric Severin - Health Coach

## 2024-08-18 ENCOUNTER — Encounter: Payer: Self-pay | Admitting: Family

## 2024-08-18 ENCOUNTER — Other Ambulatory Visit: Payer: Self-pay

## 2024-08-18 MED ORDER — METHADONE HCL 5 MG PO TABS
5.0000 mg | ORAL_TABLET | Freq: Three times a day (TID) | ORAL | 0 refills | Status: DC
  Filled 2024-08-19: qty 90, 30d supply, fill #0

## 2024-08-18 MED ORDER — OXYCODONE HCL 10 MG PO TABS
10.0000 mg | ORAL_TABLET | ORAL | 0 refills | Status: DC | PRN
  Filled 2024-08-18: qty 90, 15d supply, fill #0

## 2024-08-19 ENCOUNTER — Encounter: Payer: Self-pay | Admitting: Oncology

## 2024-08-19 ENCOUNTER — Other Ambulatory Visit: Payer: Self-pay

## 2024-08-21 ENCOUNTER — Ambulatory Visit: Admitting: Family

## 2024-08-26 ENCOUNTER — Encounter: Payer: Self-pay | Admitting: Oncology

## 2024-08-29 ENCOUNTER — Telehealth: Payer: Self-pay | Admitting: Physician Assistant

## 2024-08-29 NOTE — Telephone Encounter (Signed)
 called lvmail to r/s on 08-29-24 provider in [place]

## 2024-09-03 ENCOUNTER — Telehealth: Payer: Self-pay | Admitting: Physician Assistant

## 2024-09-03 NOTE — Telephone Encounter (Signed)
 called lvmail 09-03-24 called lvmail to r/s on 08-29-24 Mychart F/U 3 months stop smoking with PA

## 2024-09-22 ENCOUNTER — Telehealth: Admitting: Physician Assistant

## 2024-09-22 DEATH — deceased

## 2024-09-24 ENCOUNTER — Ambulatory Visit: Admitting: Oncology

## 2024-11-11 ENCOUNTER — Ambulatory Visit: Payer: Self-pay | Admitting: Urology

## 2024-11-17 ENCOUNTER — Other Ambulatory Visit: Payer: Self-pay

## 2025-05-25 ENCOUNTER — Other Ambulatory Visit

## 2025-05-29 ENCOUNTER — Ambulatory Visit: Admitting: Internal Medicine
